# Patient Record
Sex: Female | Born: 1945
Health system: Southern US, Community
[De-identification: ages and names within clinical notes are randomized; demographics above are authoritative.]

## PROBLEM LIST (undated history)

## (undated) DIAGNOSIS — I10 Essential (primary) hypertension: Secondary | ICD-10-CM

## (undated) DIAGNOSIS — K589 Irritable bowel syndrome without diarrhea: Secondary | ICD-10-CM

## (undated) DIAGNOSIS — K219 Gastro-esophageal reflux disease without esophagitis: Secondary | ICD-10-CM

## (undated) DIAGNOSIS — F329 Major depressive disorder, single episode, unspecified: Secondary | ICD-10-CM

## (undated) DIAGNOSIS — C801 Malignant (primary) neoplasm, unspecified: Secondary | ICD-10-CM

## (undated) DIAGNOSIS — E559 Vitamin D deficiency, unspecified: Secondary | ICD-10-CM

## (undated) DIAGNOSIS — F32A Depression, unspecified: Secondary | ICD-10-CM

## (undated) DIAGNOSIS — R609 Edema, unspecified: Secondary | ICD-10-CM

## (undated) DIAGNOSIS — E119 Type 2 diabetes mellitus without complications: Secondary | ICD-10-CM

## (undated) DIAGNOSIS — R161 Splenomegaly, not elsewhere classified: Secondary | ICD-10-CM

## (undated) DIAGNOSIS — K7460 Unspecified cirrhosis of liver: Secondary | ICD-10-CM

## (undated) DIAGNOSIS — Z9289 Personal history of other medical treatment: Secondary | ICD-10-CM

## (undated) DIAGNOSIS — J449 Chronic obstructive pulmonary disease, unspecified: Secondary | ICD-10-CM

## (undated) DIAGNOSIS — E785 Hyperlipidemia, unspecified: Secondary | ICD-10-CM

## (undated) DIAGNOSIS — K7682 Hepatic encephalopathy: Secondary | ICD-10-CM

## (undated) DIAGNOSIS — M797 Fibromyalgia: Secondary | ICD-10-CM

## (undated) DIAGNOSIS — J189 Pneumonia, unspecified organism: Secondary | ICD-10-CM

## (undated) DIAGNOSIS — I809 Phlebitis and thrombophlebitis of unspecified site: Secondary | ICD-10-CM

## (undated) DIAGNOSIS — K7290 Hepatic failure, unspecified without coma: Secondary | ICD-10-CM

## (undated) DIAGNOSIS — K2970 Gastritis, unspecified, without bleeding: Secondary | ICD-10-CM

## (undated) DIAGNOSIS — J45909 Unspecified asthma, uncomplicated: Secondary | ICD-10-CM

## (undated) DIAGNOSIS — I8500 Esophageal varices without bleeding: Secondary | ICD-10-CM

## (undated) HISTORY — DX: Hepatic failure, unspecified without coma: K72.90

## (undated) HISTORY — DX: Hepatic encephalopathy: K76.82

## (undated) HISTORY — DX: Gastritis, unspecified, without bleeding: K29.70

## (undated) HISTORY — PX: NECK SURGERY: SHX720

## (undated) HISTORY — PX: CHOLECYSTECTOMY: SHX55

## (undated) HISTORY — DX: Vitamin D deficiency, unspecified: E55.9

## (undated) HISTORY — PX: CATARACT EXTRACTION, BILATERAL: SHX1313

## (undated) HISTORY — PX: HIP ARTHROPLASTY: SHX981

## (undated) HISTORY — PX: ABDOMINAL HYSTERECTOMY: SHX81

## (undated) HISTORY — PX: BACK SURGERY: SHX140

## (undated) HISTORY — DX: Esophageal varices without bleeding: I85.00

## (undated) HISTORY — DX: Splenomegaly, not elsewhere classified: R16.1

## (undated) HISTORY — DX: Unspecified asthma, uncomplicated: J45.909

## (undated) HISTORY — DX: Essential (primary) hypertension: I10

## (undated) HISTORY — DX: Irritable bowel syndrome, unspecified: K58.9

## (undated) HISTORY — PX: SHOULDER SURGERY: SHX246

## (undated) HISTORY — DX: Hyperlipidemia, unspecified: E78.5

## (undated) HISTORY — DX: Gastro-esophageal reflux disease without esophagitis: K21.9

## (undated) HISTORY — DX: Fibromyalgia: M79.7

## (undated) HISTORY — DX: Type 2 diabetes mellitus without complications: E11.9

## (undated) HISTORY — DX: Chronic obstructive pulmonary disease, unspecified: J44.9

---

## 1997-11-23 ENCOUNTER — Ambulatory Visit (HOSPITAL_COMMUNITY): Admission: RE | Admit: 1997-11-23 | Discharge: 1997-11-23 | Payer: Self-pay | Admitting: *Deleted

## 1998-03-10 ENCOUNTER — Other Ambulatory Visit: Admission: RE | Admit: 1998-03-10 | Discharge: 1998-03-10 | Payer: Self-pay | Admitting: Gastroenterology

## 1998-10-19 ENCOUNTER — Other Ambulatory Visit: Admission: RE | Admit: 1998-10-19 | Discharge: 1998-10-19 | Payer: Self-pay | Admitting: *Deleted

## 1998-10-26 ENCOUNTER — Ambulatory Visit (HOSPITAL_COMMUNITY): Admission: RE | Admit: 1998-10-26 | Discharge: 1998-10-26 | Payer: Self-pay | Admitting: *Deleted

## 1998-10-28 ENCOUNTER — Ambulatory Visit (HOSPITAL_COMMUNITY): Admission: RE | Admit: 1998-10-28 | Discharge: 1998-10-28 | Payer: Self-pay | Admitting: *Deleted

## 1998-12-07 ENCOUNTER — Inpatient Hospital Stay (HOSPITAL_COMMUNITY): Admission: RE | Admit: 1998-12-07 | Discharge: 1998-12-09 | Payer: Self-pay | Admitting: Orthopedic Surgery

## 1999-04-20 ENCOUNTER — Ambulatory Visit (HOSPITAL_COMMUNITY): Admission: RE | Admit: 1999-04-20 | Discharge: 1999-04-20 | Payer: Self-pay | Admitting: *Deleted

## 2000-01-31 ENCOUNTER — Other Ambulatory Visit: Admission: RE | Admit: 2000-01-31 | Discharge: 2000-01-31 | Payer: Self-pay | Admitting: *Deleted

## 2000-02-26 ENCOUNTER — Ambulatory Visit (HOSPITAL_COMMUNITY): Admission: RE | Admit: 2000-02-26 | Discharge: 2000-02-26 | Payer: Self-pay | Admitting: *Deleted

## 2000-05-09 ENCOUNTER — Emergency Department (HOSPITAL_COMMUNITY): Admission: EM | Admit: 2000-05-09 | Discharge: 2000-05-10 | Payer: Self-pay | Admitting: Emergency Medicine

## 2000-09-21 ENCOUNTER — Ambulatory Visit (HOSPITAL_COMMUNITY): Admission: RE | Admit: 2000-09-21 | Discharge: 2000-09-21 | Payer: Self-pay | Admitting: Internal Medicine

## 2000-09-21 ENCOUNTER — Encounter: Payer: Self-pay | Admitting: Internal Medicine

## 2000-09-30 ENCOUNTER — Encounter: Payer: Self-pay | Admitting: Neurosurgery

## 2000-10-01 ENCOUNTER — Encounter: Payer: Self-pay | Admitting: Neurosurgery

## 2000-10-01 ENCOUNTER — Observation Stay (HOSPITAL_COMMUNITY): Admission: RE | Admit: 2000-10-01 | Discharge: 2000-10-02 | Payer: Self-pay | Admitting: Neurosurgery

## 2000-11-21 ENCOUNTER — Encounter: Payer: Self-pay | Admitting: Neurosurgery

## 2000-11-21 ENCOUNTER — Encounter: Admission: RE | Admit: 2000-11-21 | Discharge: 2000-11-21 | Payer: Self-pay | Admitting: Neurosurgery

## 2001-01-15 ENCOUNTER — Encounter: Payer: Self-pay | Admitting: Internal Medicine

## 2001-01-15 ENCOUNTER — Ambulatory Visit (HOSPITAL_COMMUNITY): Admission: RE | Admit: 2001-01-15 | Discharge: 2001-01-15 | Payer: Self-pay | Admitting: Internal Medicine

## 2001-01-21 ENCOUNTER — Encounter: Payer: Self-pay | Admitting: Internal Medicine

## 2001-01-21 ENCOUNTER — Ambulatory Visit (HOSPITAL_COMMUNITY): Admission: RE | Admit: 2001-01-21 | Discharge: 2001-01-21 | Payer: Self-pay | Admitting: Internal Medicine

## 2001-02-19 ENCOUNTER — Encounter: Payer: Self-pay | Admitting: Internal Medicine

## 2001-02-19 ENCOUNTER — Ambulatory Visit (HOSPITAL_COMMUNITY): Admission: RE | Admit: 2001-02-19 | Discharge: 2001-02-19 | Payer: Self-pay | Admitting: Internal Medicine

## 2001-04-09 ENCOUNTER — Ambulatory Visit (HOSPITAL_COMMUNITY): Admission: AD | Admit: 2001-04-09 | Discharge: 2001-04-09 | Payer: Self-pay | Admitting: Cardiology

## 2001-06-19 ENCOUNTER — Ambulatory Visit (HOSPITAL_COMMUNITY): Admission: RE | Admit: 2001-06-19 | Discharge: 2001-06-19 | Payer: Self-pay | Admitting: Internal Medicine

## 2001-06-19 ENCOUNTER — Encounter: Payer: Self-pay | Admitting: Internal Medicine

## 2001-08-21 ENCOUNTER — Encounter: Admission: RE | Admit: 2001-08-21 | Discharge: 2001-08-21 | Payer: Self-pay | Admitting: Urology

## 2001-08-21 ENCOUNTER — Encounter: Payer: Self-pay | Admitting: Urology

## 2001-08-27 ENCOUNTER — Encounter: Payer: Self-pay | Admitting: Urology

## 2001-08-27 ENCOUNTER — Encounter: Admission: RE | Admit: 2001-08-27 | Discharge: 2001-08-27 | Payer: Self-pay | Admitting: Urology

## 2001-08-28 ENCOUNTER — Encounter: Payer: Self-pay | Admitting: Internal Medicine

## 2001-08-28 ENCOUNTER — Ambulatory Visit (HOSPITAL_COMMUNITY): Admission: RE | Admit: 2001-08-28 | Discharge: 2001-08-28 | Payer: Self-pay | Admitting: Internal Medicine

## 2001-09-03 ENCOUNTER — Encounter: Payer: Self-pay | Admitting: Urology

## 2001-09-03 ENCOUNTER — Ambulatory Visit (HOSPITAL_COMMUNITY): Admission: RE | Admit: 2001-09-03 | Discharge: 2001-09-03 | Payer: Self-pay | Admitting: Urology

## 2002-03-18 ENCOUNTER — Encounter: Admission: RE | Admit: 2002-03-18 | Discharge: 2002-03-18 | Payer: Self-pay | Admitting: Urology

## 2002-03-18 ENCOUNTER — Encounter: Payer: Self-pay | Admitting: Urology

## 2002-04-01 ENCOUNTER — Encounter: Payer: Self-pay | Admitting: Gastroenterology

## 2002-04-08 ENCOUNTER — Encounter: Payer: Self-pay | Admitting: Urology

## 2002-04-14 ENCOUNTER — Ambulatory Visit (HOSPITAL_COMMUNITY): Admission: RE | Admit: 2002-04-14 | Discharge: 2002-04-14 | Payer: Self-pay | Admitting: Urology

## 2002-04-14 ENCOUNTER — Encounter: Payer: Self-pay | Admitting: Urology

## 2002-05-12 ENCOUNTER — Ambulatory Visit (HOSPITAL_COMMUNITY): Admission: RE | Admit: 2002-05-12 | Discharge: 2002-05-12 | Payer: Self-pay | Admitting: Internal Medicine

## 2002-05-12 ENCOUNTER — Encounter: Payer: Self-pay | Admitting: Internal Medicine

## 2002-05-13 ENCOUNTER — Encounter: Payer: Self-pay | Admitting: Internal Medicine

## 2002-05-13 ENCOUNTER — Encounter: Admission: RE | Admit: 2002-05-13 | Discharge: 2002-05-13 | Payer: Self-pay | Admitting: Internal Medicine

## 2002-05-26 ENCOUNTER — Ambulatory Visit (HOSPITAL_COMMUNITY): Admission: RE | Admit: 2002-05-26 | Discharge: 2002-05-26 | Payer: Self-pay | Admitting: Urology

## 2002-06-18 ENCOUNTER — Encounter: Payer: Self-pay | Admitting: Urology

## 2002-06-18 ENCOUNTER — Encounter: Admission: RE | Admit: 2002-06-18 | Discharge: 2002-06-18 | Payer: Self-pay | Admitting: Urology

## 2002-08-13 ENCOUNTER — Encounter: Payer: Self-pay | Admitting: Urology

## 2002-08-13 ENCOUNTER — Ambulatory Visit (HOSPITAL_COMMUNITY): Admission: RE | Admit: 2002-08-13 | Discharge: 2002-08-13 | Payer: Self-pay | Admitting: Urology

## 2002-09-09 ENCOUNTER — Ambulatory Visit: Admission: RE | Admit: 2002-09-09 | Discharge: 2002-09-09 | Payer: Self-pay | Admitting: Internal Medicine

## 2002-10-26 ENCOUNTER — Encounter: Payer: Self-pay | Admitting: Internal Medicine

## 2002-10-26 ENCOUNTER — Ambulatory Visit (HOSPITAL_COMMUNITY): Admission: RE | Admit: 2002-10-26 | Discharge: 2002-10-26 | Payer: Self-pay | Admitting: Internal Medicine

## 2003-02-11 ENCOUNTER — Emergency Department (HOSPITAL_COMMUNITY): Admission: EM | Admit: 2003-02-11 | Discharge: 2003-02-11 | Payer: Self-pay | Admitting: Emergency Medicine

## 2003-03-14 ENCOUNTER — Encounter: Payer: Self-pay | Admitting: Neurosurgery

## 2003-03-14 ENCOUNTER — Ambulatory Visit (HOSPITAL_COMMUNITY): Admission: RE | Admit: 2003-03-14 | Discharge: 2003-03-14 | Payer: Self-pay | Admitting: Neurosurgery

## 2003-04-28 ENCOUNTER — Encounter: Payer: Self-pay | Admitting: Orthopedic Surgery

## 2003-05-03 ENCOUNTER — Inpatient Hospital Stay (HOSPITAL_COMMUNITY): Admission: RE | Admit: 2003-05-03 | Discharge: 2003-05-07 | Payer: Self-pay | Admitting: Orthopedic Surgery

## 2003-05-03 ENCOUNTER — Encounter: Payer: Self-pay | Admitting: Orthopedic Surgery

## 2003-06-09 ENCOUNTER — Encounter: Admission: RE | Admit: 2003-06-09 | Discharge: 2003-06-09 | Payer: Self-pay | Admitting: Neurosurgery

## 2003-06-24 ENCOUNTER — Encounter: Admission: RE | Admit: 2003-06-24 | Discharge: 2003-06-24 | Payer: Self-pay | Admitting: Neurosurgery

## 2003-07-20 ENCOUNTER — Encounter: Admission: RE | Admit: 2003-07-20 | Discharge: 2003-07-20 | Payer: Self-pay | Admitting: Neurosurgery

## 2003-08-17 ENCOUNTER — Ambulatory Visit (HOSPITAL_COMMUNITY): Admission: RE | Admit: 2003-08-17 | Discharge: 2003-08-17 | Payer: Self-pay | Admitting: Internal Medicine

## 2003-09-12 ENCOUNTER — Emergency Department (HOSPITAL_COMMUNITY): Admission: EM | Admit: 2003-09-12 | Discharge: 2003-09-12 | Payer: Self-pay | Admitting: Emergency Medicine

## 2003-09-28 ENCOUNTER — Ambulatory Visit (HOSPITAL_COMMUNITY): Admission: RE | Admit: 2003-09-28 | Discharge: 2003-09-28 | Payer: Self-pay | Admitting: Internal Medicine

## 2004-02-28 ENCOUNTER — Ambulatory Visit (HOSPITAL_COMMUNITY): Admission: RE | Admit: 2004-02-28 | Discharge: 2004-02-28 | Payer: Self-pay | Admitting: Internal Medicine

## 2004-03-22 ENCOUNTER — Inpatient Hospital Stay (HOSPITAL_COMMUNITY): Admission: RE | Admit: 2004-03-22 | Discharge: 2004-03-28 | Payer: Self-pay | Admitting: Neurosurgery

## 2004-04-15 ENCOUNTER — Emergency Department (HOSPITAL_COMMUNITY): Admission: EM | Admit: 2004-04-15 | Discharge: 2004-04-15 | Payer: Self-pay | Admitting: Emergency Medicine

## 2004-08-31 ENCOUNTER — Ambulatory Visit (HOSPITAL_COMMUNITY): Admission: RE | Admit: 2004-08-31 | Discharge: 2004-08-31 | Payer: Self-pay | Admitting: Internal Medicine

## 2005-03-06 ENCOUNTER — Ambulatory Visit (HOSPITAL_COMMUNITY): Admission: RE | Admit: 2005-03-06 | Discharge: 2005-03-06 | Payer: Self-pay | Admitting: Internal Medicine

## 2005-07-24 ENCOUNTER — Inpatient Hospital Stay (HOSPITAL_COMMUNITY): Admission: EM | Admit: 2005-07-24 | Discharge: 2005-07-26 | Payer: Self-pay | Admitting: Emergency Medicine

## 2006-03-07 ENCOUNTER — Ambulatory Visit (HOSPITAL_COMMUNITY): Admission: RE | Admit: 2006-03-07 | Discharge: 2006-03-07 | Payer: Self-pay | Admitting: Internal Medicine

## 2006-03-28 ENCOUNTER — Ambulatory Visit: Payer: Self-pay | Admitting: Gastroenterology

## 2006-04-01 ENCOUNTER — Ambulatory Visit: Payer: Self-pay | Admitting: Gastroenterology

## 2006-04-09 ENCOUNTER — Ambulatory Visit: Payer: Self-pay | Admitting: Cardiovascular Disease

## 2006-04-22 ENCOUNTER — Ambulatory Visit: Payer: Self-pay | Admitting: Gastroenterology

## 2006-04-26 ENCOUNTER — Ambulatory Visit (HOSPITAL_COMMUNITY): Admission: RE | Admit: 2006-04-26 | Discharge: 2006-04-27 | Payer: Self-pay | Admitting: Neurosurgery

## 2006-05-27 ENCOUNTER — Ambulatory Visit: Payer: Self-pay | Admitting: Gastroenterology

## 2006-05-28 ENCOUNTER — Encounter: Payer: Self-pay | Admitting: Gastroenterology

## 2006-05-28 ENCOUNTER — Ambulatory Visit (HOSPITAL_COMMUNITY): Admission: RE | Admit: 2006-05-28 | Discharge: 2006-05-28 | Payer: Self-pay | Admitting: Gastroenterology

## 2006-05-28 ENCOUNTER — Encounter (INDEPENDENT_AMBULATORY_CARE_PROVIDER_SITE_OTHER): Payer: Self-pay | Admitting: *Deleted

## 2006-06-03 ENCOUNTER — Ambulatory Visit: Payer: Self-pay | Admitting: Gastroenterology

## 2006-06-05 ENCOUNTER — Encounter: Admission: RE | Admit: 2006-06-05 | Discharge: 2006-06-05 | Payer: Self-pay | Admitting: Neurosurgery

## 2006-06-07 ENCOUNTER — Ambulatory Visit: Payer: Self-pay | Admitting: Gastroenterology

## 2006-10-14 ENCOUNTER — Encounter: Admission: RE | Admit: 2006-10-14 | Discharge: 2006-10-14 | Payer: Self-pay | Admitting: Internal Medicine

## 2006-10-17 ENCOUNTER — Ambulatory Visit (HOSPITAL_COMMUNITY): Admission: RE | Admit: 2006-10-17 | Discharge: 2006-10-17 | Payer: Self-pay | Admitting: Anesthesiology

## 2006-10-29 ENCOUNTER — Encounter: Admission: RE | Admit: 2006-10-29 | Discharge: 2006-10-29 | Payer: Self-pay | Admitting: Internal Medicine

## 2007-03-17 ENCOUNTER — Emergency Department (HOSPITAL_COMMUNITY): Admission: EM | Admit: 2007-03-17 | Discharge: 2007-03-17 | Payer: Self-pay | Admitting: Emergency Medicine

## 2007-03-25 ENCOUNTER — Ambulatory Visit (HOSPITAL_COMMUNITY): Admission: RE | Admit: 2007-03-25 | Discharge: 2007-03-25 | Payer: Self-pay | Admitting: Internal Medicine

## 2007-04-03 ENCOUNTER — Ambulatory Visit (HOSPITAL_COMMUNITY): Admission: RE | Admit: 2007-04-03 | Discharge: 2007-04-03 | Payer: Self-pay | Admitting: Internal Medicine

## 2007-04-08 ENCOUNTER — Ambulatory Visit (HOSPITAL_COMMUNITY): Admission: RE | Admit: 2007-04-08 | Discharge: 2007-04-08 | Payer: Self-pay | Admitting: Internal Medicine

## 2007-05-23 ENCOUNTER — Ambulatory Visit: Payer: Self-pay

## 2007-09-16 ENCOUNTER — Encounter: Admission: RE | Admit: 2007-09-16 | Discharge: 2007-09-16 | Payer: Self-pay | Admitting: Orthopedic Surgery

## 2007-09-19 ENCOUNTER — Encounter: Admission: RE | Admit: 2007-09-19 | Discharge: 2007-09-19 | Payer: Self-pay | Admitting: Internal Medicine

## 2008-01-27 ENCOUNTER — Encounter: Admission: RE | Admit: 2008-01-27 | Discharge: 2008-01-27 | Payer: Self-pay | Admitting: Orthopedic Surgery

## 2008-02-22 ENCOUNTER — Emergency Department (HOSPITAL_COMMUNITY): Admission: EM | Admit: 2008-02-22 | Discharge: 2008-02-22 | Payer: Self-pay | Admitting: Family Medicine

## 2008-03-25 ENCOUNTER — Ambulatory Visit (HOSPITAL_COMMUNITY): Admission: RE | Admit: 2008-03-25 | Discharge: 2008-03-25 | Payer: Self-pay | Admitting: Internal Medicine

## 2008-04-02 ENCOUNTER — Ambulatory Visit (HOSPITAL_COMMUNITY): Admission: RE | Admit: 2008-04-02 | Discharge: 2008-04-02 | Payer: Self-pay | Admitting: General Surgery

## 2008-04-13 ENCOUNTER — Inpatient Hospital Stay (HOSPITAL_COMMUNITY): Admission: EM | Admit: 2008-04-13 | Discharge: 2008-04-17 | Payer: Self-pay | Admitting: Emergency Medicine

## 2008-04-15 ENCOUNTER — Encounter (INDEPENDENT_AMBULATORY_CARE_PROVIDER_SITE_OTHER): Payer: Self-pay | Admitting: *Deleted

## 2008-04-15 ENCOUNTER — Encounter (INDEPENDENT_AMBULATORY_CARE_PROVIDER_SITE_OTHER): Payer: Self-pay | Admitting: General Surgery

## 2008-04-20 ENCOUNTER — Ambulatory Visit (HOSPITAL_COMMUNITY): Admission: RE | Admit: 2008-04-20 | Discharge: 2008-04-20 | Payer: Self-pay | Admitting: Internal Medicine

## 2008-05-08 ENCOUNTER — Emergency Department (HOSPITAL_COMMUNITY): Admission: EM | Admit: 2008-05-08 | Discharge: 2008-05-08 | Payer: Self-pay | Admitting: Emergency Medicine

## 2008-05-10 ENCOUNTER — Ambulatory Visit (HOSPITAL_COMMUNITY): Admission: RE | Admit: 2008-05-10 | Discharge: 2008-05-10 | Payer: Self-pay | Admitting: Orthopedic Surgery

## 2008-05-27 ENCOUNTER — Telehealth: Payer: Self-pay | Admitting: Gastroenterology

## 2008-05-27 DIAGNOSIS — K209 Esophagitis, unspecified: Secondary | ICD-10-CM

## 2008-05-27 DIAGNOSIS — K5730 Diverticulosis of large intestine without perforation or abscess without bleeding: Secondary | ICD-10-CM

## 2008-05-28 ENCOUNTER — Ambulatory Visit: Payer: Self-pay | Admitting: Gastroenterology

## 2008-05-28 DIAGNOSIS — K7460 Unspecified cirrhosis of liver: Secondary | ICD-10-CM

## 2008-05-28 DIAGNOSIS — D649 Anemia, unspecified: Secondary | ICD-10-CM | POA: Insufficient documentation

## 2008-06-01 ENCOUNTER — Encounter: Payer: Self-pay | Admitting: Gastroenterology

## 2008-06-01 ENCOUNTER — Ambulatory Visit: Payer: Self-pay | Admitting: Gastroenterology

## 2008-06-04 ENCOUNTER — Ambulatory Visit: Payer: Self-pay | Admitting: Gastroenterology

## 2008-06-04 LAB — CONVERTED CEMR LAB
OCCULT 1: NEGATIVE
OCCULT 2: NEGATIVE
OCCULT 3: NEGATIVE
OCCULT 4: NEGATIVE
OCCULT 5: NEGATIVE

## 2008-06-07 ENCOUNTER — Emergency Department (HOSPITAL_COMMUNITY): Admission: EM | Admit: 2008-06-07 | Discharge: 2008-06-08 | Payer: Self-pay | Admitting: Emergency Medicine

## 2008-06-08 ENCOUNTER — Telehealth: Payer: Self-pay | Admitting: Gastroenterology

## 2008-06-10 ENCOUNTER — Encounter: Admission: RE | Admit: 2008-06-10 | Discharge: 2008-06-10 | Payer: Self-pay | Admitting: Internal Medicine

## 2008-06-10 ENCOUNTER — Ambulatory Visit: Payer: Self-pay | Admitting: Gastroenterology

## 2008-06-15 ENCOUNTER — Encounter: Payer: Self-pay | Admitting: Gastroenterology

## 2008-06-17 ENCOUNTER — Encounter: Payer: Self-pay | Admitting: Gastroenterology

## 2008-06-17 ENCOUNTER — Ambulatory Visit: Payer: Self-pay | Admitting: Gastroenterology

## 2008-06-17 LAB — CONVERTED CEMR LAB
Basophils Absolute: 0 10*3/uL (ref 0.0–0.1)
Eosinophils Absolute: 0.1 10*3/uL (ref 0.0–0.7)
Lymphocytes Relative: 25.1 % (ref 12.0–46.0)
MCHC: 32.8 g/dL (ref 30.0–36.0)
MCV: 77.5 fL — ABNORMAL LOW (ref 78.0–100.0)
Neutrophils Relative %: 68.8 % (ref 43.0–77.0)
Platelets: 91 10*3/uL — ABNORMAL LOW (ref 150–400)
RDW: 19.1 % — ABNORMAL HIGH (ref 11.5–14.6)

## 2008-06-21 ENCOUNTER — Encounter: Payer: Self-pay | Admitting: Gastroenterology

## 2008-06-28 ENCOUNTER — Inpatient Hospital Stay (HOSPITAL_COMMUNITY): Admission: RE | Admit: 2008-06-28 | Discharge: 2008-07-01 | Payer: Self-pay | Admitting: Orthopedic Surgery

## 2008-10-21 ENCOUNTER — Encounter: Payer: Self-pay | Admitting: Gastroenterology

## 2008-10-21 ENCOUNTER — Ambulatory Visit (HOSPITAL_COMMUNITY): Admission: RE | Admit: 2008-10-21 | Discharge: 2008-10-21 | Payer: Self-pay | Admitting: Internal Medicine

## 2008-10-22 ENCOUNTER — Emergency Department (HOSPITAL_COMMUNITY): Admission: EM | Admit: 2008-10-22 | Discharge: 2008-10-22 | Payer: Self-pay | Admitting: Emergency Medicine

## 2008-10-23 ENCOUNTER — Observation Stay (HOSPITAL_COMMUNITY): Admission: EM | Admit: 2008-10-23 | Discharge: 2008-10-25 | Payer: Self-pay | Admitting: Emergency Medicine

## 2008-11-02 ENCOUNTER — Encounter: Payer: Self-pay | Admitting: Gastroenterology

## 2008-11-03 ENCOUNTER — Telehealth: Payer: Self-pay | Admitting: Gastroenterology

## 2008-11-11 ENCOUNTER — Ambulatory Visit: Payer: Self-pay | Admitting: Gastroenterology

## 2008-11-11 DIAGNOSIS — D126 Benign neoplasm of colon, unspecified: Secondary | ICD-10-CM

## 2008-11-12 ENCOUNTER — Telehealth: Payer: Self-pay | Admitting: Gastroenterology

## 2008-11-15 ENCOUNTER — Telehealth: Payer: Self-pay | Admitting: Gastroenterology

## 2008-11-23 ENCOUNTER — Ambulatory Visit: Payer: Self-pay | Admitting: Gastroenterology

## 2008-11-23 ENCOUNTER — Encounter: Payer: Self-pay | Admitting: Gastroenterology

## 2008-12-02 ENCOUNTER — Telehealth: Payer: Self-pay | Admitting: Gastroenterology

## 2008-12-02 ENCOUNTER — Encounter: Payer: Self-pay | Admitting: Gastroenterology

## 2008-12-09 ENCOUNTER — Ambulatory Visit (HOSPITAL_COMMUNITY): Admission: RE | Admit: 2008-12-09 | Discharge: 2008-12-09 | Payer: Self-pay | Admitting: Gastroenterology

## 2008-12-09 ENCOUNTER — Ambulatory Visit: Payer: Self-pay | Admitting: Gastroenterology

## 2008-12-29 ENCOUNTER — Encounter: Payer: Self-pay | Admitting: Gastroenterology

## 2009-01-04 ENCOUNTER — Telehealth: Payer: Self-pay | Admitting: Gastroenterology

## 2009-01-06 ENCOUNTER — Ambulatory Visit: Payer: Self-pay | Admitting: Gastroenterology

## 2009-01-11 ENCOUNTER — Emergency Department (HOSPITAL_COMMUNITY): Admission: EM | Admit: 2009-01-11 | Discharge: 2009-01-12 | Payer: Self-pay | Admitting: Emergency Medicine

## 2009-02-11 ENCOUNTER — Encounter: Admission: RE | Admit: 2009-02-11 | Discharge: 2009-02-11 | Payer: Self-pay | Admitting: Anesthesiology

## 2009-02-28 ENCOUNTER — Encounter: Payer: Self-pay | Admitting: Gastroenterology

## 2009-05-05 ENCOUNTER — Telehealth: Payer: Self-pay | Admitting: Gastroenterology

## 2009-05-10 ENCOUNTER — Ambulatory Visit: Payer: Self-pay | Admitting: Gastroenterology

## 2009-05-10 LAB — CONVERTED CEMR LAB
Anti Nuclear Antibody(ANA): NEGATIVE
Hepatitis B Surface Ag: NEGATIVE
Prothrombin Time: 11.1 s (ref 9.1–11.7)
aPTT: 31.5 s — ABNORMAL HIGH (ref 21.7–28.8)

## 2009-05-13 ENCOUNTER — Ambulatory Visit (HOSPITAL_COMMUNITY): Admission: RE | Admit: 2009-05-13 | Discharge: 2009-05-13 | Payer: Self-pay | Admitting: Gastroenterology

## 2009-05-26 ENCOUNTER — Telehealth: Payer: Self-pay | Admitting: Gastroenterology

## 2009-08-01 ENCOUNTER — Ambulatory Visit (HOSPITAL_COMMUNITY): Admission: RE | Admit: 2009-08-01 | Discharge: 2009-08-01 | Payer: Self-pay | Admitting: Internal Medicine

## 2009-08-04 ENCOUNTER — Ambulatory Visit (HOSPITAL_COMMUNITY): Admission: RE | Admit: 2009-08-04 | Discharge: 2009-08-04 | Payer: Self-pay | Admitting: Internal Medicine

## 2009-08-09 ENCOUNTER — Encounter: Admission: RE | Admit: 2009-08-09 | Discharge: 2009-08-09 | Payer: Self-pay | Admitting: Internal Medicine

## 2009-08-31 ENCOUNTER — Ambulatory Visit: Payer: Self-pay | Admitting: Hematology and Oncology

## 2009-09-01 ENCOUNTER — Encounter: Payer: Self-pay | Admitting: Gastroenterology

## 2009-09-01 LAB — CBC WITH DIFFERENTIAL/PLATELET
EOS%: 3.1 % (ref 0.0–7.0)
Eosinophils Absolute: 0.1 10*3/uL (ref 0.0–0.5)
LYMPH%: 33.5 % (ref 14.0–49.7)
MCH: 30.7 pg (ref 25.1–34.0)
MCV: 91 fL (ref 79.5–101.0)
MONO%: 6.3 % (ref 0.0–14.0)
NEUT#: 2 10*3/uL (ref 1.5–6.5)
Platelets: 90 10*3/uL — ABNORMAL LOW (ref 145–400)
RBC: 3.77 10*6/uL (ref 3.70–5.45)
RDW: 14.2 % (ref 11.2–14.5)

## 2009-09-01 LAB — CHCC SMEAR

## 2009-09-05 LAB — COMPREHENSIVE METABOLIC PANEL
AST: 27 U/L (ref 0–37)
BUN: 10 mg/dL (ref 6–23)
CO2: 31 mEq/L (ref 19–32)
Calcium: 9 mg/dL (ref 8.4–10.5)
Chloride: 104 mEq/L (ref 96–112)
Creatinine, Ser: 0.77 mg/dL (ref 0.40–1.20)
Total Bilirubin: 0.6 mg/dL (ref 0.3–1.2)

## 2009-09-05 LAB — PROTEIN ELECTROPHORESIS, SERUM, WITH REFLEX
Albumin ELP: 60 % (ref 55.8–66.1)
Alpha-1-Globulin: 6 % — ABNORMAL HIGH (ref 2.9–4.9)
Alpha-2-Globulin: 10.1 % (ref 7.1–11.8)

## 2009-09-05 LAB — LACTATE DEHYDROGENASE: LDH: 167 U/L (ref 94–250)

## 2009-09-06 ENCOUNTER — Ambulatory Visit (HOSPITAL_COMMUNITY): Admission: RE | Admit: 2009-09-06 | Discharge: 2009-09-06 | Payer: Self-pay | Admitting: Hematology and Oncology

## 2009-09-13 ENCOUNTER — Encounter: Payer: Self-pay | Admitting: Gastroenterology

## 2009-09-15 ENCOUNTER — Other Ambulatory Visit: Admission: RE | Admit: 2009-09-15 | Discharge: 2009-09-15 | Payer: Self-pay | Admitting: Hematology and Oncology

## 2009-09-15 ENCOUNTER — Emergency Department (HOSPITAL_COMMUNITY): Admission: EM | Admit: 2009-09-15 | Discharge: 2009-09-15 | Payer: Self-pay | Admitting: Emergency Medicine

## 2009-09-23 ENCOUNTER — Encounter: Payer: Self-pay | Admitting: Gastroenterology

## 2010-01-18 ENCOUNTER — Encounter: Payer: Self-pay | Admitting: Gastroenterology

## 2010-05-15 ENCOUNTER — Encounter: Payer: Self-pay | Admitting: Gastroenterology

## 2010-05-30 ENCOUNTER — Encounter: Payer: Self-pay | Admitting: Gastroenterology

## 2010-07-04 ENCOUNTER — Encounter: Payer: Self-pay | Admitting: Gastroenterology

## 2010-07-04 ENCOUNTER — Ambulatory Visit (HOSPITAL_COMMUNITY): Admission: RE | Admit: 2010-07-04 | Discharge: 2010-07-04 | Payer: Self-pay | Admitting: Internal Medicine

## 2010-07-06 ENCOUNTER — Encounter: Payer: Self-pay | Admitting: Gastroenterology

## 2010-08-01 ENCOUNTER — Encounter: Payer: Self-pay | Admitting: Gastroenterology

## 2010-08-01 ENCOUNTER — Ambulatory Visit (HOSPITAL_COMMUNITY)
Admission: RE | Admit: 2010-08-01 | Discharge: 2010-08-01 | Payer: Self-pay | Source: Home / Self Care | Attending: Internal Medicine | Admitting: Internal Medicine

## 2010-08-02 ENCOUNTER — Telehealth: Payer: Self-pay | Admitting: Gastroenterology

## 2010-08-04 ENCOUNTER — Encounter: Payer: Self-pay | Admitting: Gastroenterology

## 2010-08-10 ENCOUNTER — Encounter: Payer: Self-pay | Admitting: Gastroenterology

## 2010-08-27 ENCOUNTER — Encounter: Payer: Self-pay | Admitting: Internal Medicine

## 2010-08-29 ENCOUNTER — Telehealth: Payer: Self-pay | Admitting: Gastroenterology

## 2010-09-01 ENCOUNTER — Ambulatory Visit (HOSPITAL_COMMUNITY)
Admission: RE | Admit: 2010-09-01 | Discharge: 2010-09-01 | Payer: Self-pay | Source: Home / Self Care | Attending: Anesthesiology | Admitting: Anesthesiology

## 2010-09-05 NOTE — Letter (Signed)
Summary: [facility]   Imported By: Phillis Knack 10/07/2009 11:41:41  _____________________________________________________________________  External Attachment:    Type:   Image     Comment:   External Document

## 2010-09-05 NOTE — Letter (Signed)
Summary: Patient Notice-Endo Biopsy Results  [facility]  [place], [place] [address]   Phone: [phone]  Fax: [phone]        June 04, 2008 MRN: [id number]    [name] [place], [place]  [address]    Dear [name],  I am pleased to inform you that the biopsies taken during your recent endoscopic examination did not show any evidence of cancer upon pathologic examination.  Additional information/recommendations:  __No further action is needed at this time.  Please follow-up with      your primary care physician for your other healthcare needs.  __ Please call [phone] to schedule a return visit to review      your condition.  _x_ Continue with the treatment plan as outlined on the day of your      exam.  __ You should have a repeat endoscopic examination for thi_s problem              in _ months/years.   Please call us if you are having persistent problems or have questions about your condition that have not been fully answered at this time.  Sincerely,  Inda Castle MD  This letter has been electronically signed by your physician.

## 2010-09-05 NOTE — Letter (Signed)
Summary: [facility]   Imported By: Phillis Knack 09/15/2009 12:24:17  _____________________________________________________________________  External Attachment:    Type:   Image     Comment:   External Document

## 2010-09-07 NOTE — Progress Notes (Signed)
Summary: Triage  Phone Note Other Incoming   Caller: Trip @ Dr. Johnnye Sima [phone] Summary of Call: abd pain and followup of cirrhosis...requesting pt. be seen in the next 2-3 weeks Initial call taken by: Webb Laws,  August 29, 2010 8:54 AM  Follow-up for Phone Call        Appointment given for 09/25/10 @ 3:15pm. Trip will notify the patient. Instructed him to tell patient if she needs to be seen sooner to call us back and we could get her in to see one of the midlevel providers. Follow-up by: Rosanne Sack RN,  August 29, 2010 10:30 AM

## 2010-09-07 NOTE — Progress Notes (Signed)
Summary: Discuss dx with nurse  Phone Note From Other Clinic   Caller: Fredderick Phenix [phone]  Call For: Dr Deatra Ina Summary of Call: Wants to discuss with nurse patients diagnosis to determine if his appt on 08-23-10 should be sooner. Initial call taken by: Irwin Brakeman [facility],  August 02, 2010 1:42 PM  Follow-up for Phone Call        pt. was seen in their office yesterday and blood  work done for cirrhosis and they adjusted her diuretics.They have her coming in Friday and they will fax results to Vaughan Basta to see if she needs worked in sooner than 08/23/10 Follow-up by: Abel Presto RN,  August 02, 2010 3:04 PM  Additional Follow-up for Phone Call Additional follow up Details #1::        Followed up with Tripp because we have not seen any labs on this patient. Per Tripp the patient is being seen in their office today and they will send records after this visit. Additional Follow-up by: Rosanne Sack RN,  August 10, 2010 2:20 PM    Additional Follow-up for Phone Call Additional follow up Details #2::    Patients husband called and cancelled appointment on 08/11/10, states that her primary physician has "gotten her under control." Follow-up by: Rosanne Sack RN,  August 14, 2010 2:26 PM

## 2010-09-08 NOTE — Letter (Signed)
Summary: [facility]   Imported By: Phillis Knack 10/07/2009 11:39:52  _____________________________________________________________________  External Attachment:    Type:   Image     Comment:   External Document

## 2010-09-13 NOTE — Letter (Signed)
Summary: [facility]  [facility]   Imported By: Phillis Knack 09/08/2010 12:36:30  _____________________________________________________________________  External Attachment:    Type:   Image     Comment:   External Document

## 2010-09-13 NOTE — Letter (Signed)
Summary: [facility]  [facility]   Imported By: Phillis Knack 09/08/2010 12:37:19  _____________________________________________________________________  External Attachment:    Type:   Image     Comment:   External Document

## 2010-09-25 ENCOUNTER — Ambulatory Visit (INDEPENDENT_AMBULATORY_CARE_PROVIDER_SITE_OTHER): Payer: 59 | Admitting: Gastroenterology

## 2010-09-25 ENCOUNTER — Encounter (INDEPENDENT_AMBULATORY_CARE_PROVIDER_SITE_OTHER): Payer: Self-pay | Admitting: *Deleted

## 2010-09-25 ENCOUNTER — Encounter: Payer: Self-pay | Admitting: Gastroenterology

## 2010-09-25 DIAGNOSIS — K7460 Unspecified cirrhosis of liver: Secondary | ICD-10-CM

## 2010-10-03 NOTE — Assessment & Plan Note (Signed)
Summary: F/U Cirrhosis   History of Present Illness Visit Type: Follow-up Visit Primary GI MD: Erskine Emery MD [facility] Primary Provider: Rachel Moulds, DO Requesting Provider: Rachel Moulds, DO Chief Complaint: cirrhosis History of Present Illness:   Mrs. Furlough  is a 65 year old white female with [name] and cirrhosis, here for yearly followup. She has no new GI complaints. She continues to have right upper quadrant discomfort. She reports mild constipation. She has portal hypertension with splenomegaly and thrombocytopenia. Last endoscopy in May, 2010 , was negative for varices.   GI Review of Systems    Reports abdominal pain and  bloating.      Denies acid reflux, belching, chest pain, dysphagia with liquids, dysphagia with solids, heartburn, loss of appetite, nausea, vomiting, vomiting blood, weight loss, and  weight gain.      Reports constipation and  liver problems.     Denies anal fissure, black tarry stools, change in bowel habit, diarrhea, diverticulosis, fecal incontinence, heme positive stool, hemorrhoids, irritable bowel syndrome, jaundice, light color stool, rectal bleeding, and  rectal pain.    Current Medications (verified): 1)  Omeprazole 20 Mg  Cpdr (Omeprazole) .Marland Kitchen.. 1 Each Day 30 Minutes Before Meal 2)  Lyrica 100 Mg Caps (Pregabalin) .... One Tablet By Mouth Three Times A Day 3)  Cymbalta 60 Mg Cpep (Duloxetine Hcl) .Marland Kitchen.. 1 Tablet By Mouth Once Daily 4)  Amitriptyline Hcl 10 Mg Tabs (Amitriptyline Hcl) .Marland Kitchen.. 1 Tablet At Bedtime 5)  Evista 60 Mg Tabs (Raloxifene Hcl) .Marland Kitchen.. 1 Tablet By Mouth Once Daily 6)  Vitamin D 1000 Unit Caps (Cholecalciferol) .... Take 2 Caps Daily 7)  Fish Oil 1200 Mg Caps (Omega-3 Fatty Acids) .... Take 1 Tablet By Mouth Once A Day 8)  Aleve 220 Mg Caps (Naproxen Sodium) .... Take 2 Caps Daily 9)  Adult Aspirin Ec Low Strength 81 Mg Tbec (Aspirin) .... Take 1 Tablet By Mouth Once A Day 10)  Natural Vegetable Laxative 65-325 Mg Tabs (Senna) .... As  Needed 11)  Cvs Iron 325 (65 Fe) Mg Tabs (Ferrous Sulfate) .... One Tablet By Mouth Two Times A Day 12)  Methadone Hcl 5 Mg Tabs (Methadone Hcl) .... 1/2 To 1 Tablet By Mouth Once Daily 13)  Magnesium 250 Mg Tabs (Magnesium) .... One Tablet By Mouth Once Daily 14)  Vitamin E 400 Unit Caps (Vitamin E) .... Once Daily 15)  Integra Plus  Caps (Fefum-Fepoly-Fa-B Cmp-C-Biot) .... Once Daily  Allergies (verified): 1)  ! Benadryl 2)  ! Vicodin 3)  ! Zocor 4)  ! Sulfa 5)  ! Lipitor 6)  ! * Emycin 7)  ! Ativan 8)  ! * Claritin D 9)  ! Bebe Shaggy  Past History:  Past Medical History: Reviewed history from 11/11/2008 and no changes required. 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 L Hip replacement  2002 Back surgery x2 Cholecystectomy 9/09 R Hip Replacement 11/09 Shoulder Surgery  Family History: Reviewed history from 05/28/2008 and no changes required. Family History of Ovarian Cancer:maternal grandmother No FH of Colon Cancer: Family History of Diabetes: brother Family History of Heart Disease: mother  Social History: Reviewed history from 11/11/2008 and no changes required. Patient currently smokes.  Alcohol Use - yes Daily Caffeine Use coffee, coke, tea Illicit Drug Use - no  Review of Systems  The patient complains of arthritis/joint pain and back pain.  The patient denies allergy/sinus, anemia, anxiety-new, blood in urine, breast changes/lumps, change in vision, confusion, cough, coughing up blood, depression-new, fainting, fatigue, fever, headaches-new, hearing problems, heart murmur, heart rhythm changes, itching, menstrual pain, muscle pains/cramps, night sweats, nosebleeds, pregnancy symptoms, shortness  of breath, skin rash, sleeping problems, sore throat, swelling of feet/legs, swollen lymph glands, thirst - excessive , urination - excessive , urination changes/pain, urine leakage, vision changes, and voice change.         All other systems were reviewed and were negative   Vital Signs:  Patient profile:   65 year old female Height:      62 inches Weight:      160.38 pounds BMI:     29.44 Pulse rate:   68 / minute Pulse rhythm:   regular BP sitting:   146 / 72  (left arm) Cuff size:   regular  Vitals Entered By: June McMurray CMA Deborra Medina) (September 25, 2010 3:17 PM)  Physical Exam  Additional Exam:   On physical exam, she is a well-developed, well-nourished, female.  Physical Exam: General:   WDWN HEENT:   anicteric.  No pharyngeal abnormalities Neck:   No masses, thyroidmegaly Nodes:   No cervical, axillary, inguinal adenopathy Chest:    Clear to auscultation Cardiac:   No murmurs, gallops, rubs Abdomen:   BS active.  No abd masses;  liver is palpable and tender in the right upper quadrant at the right costal margin. Rectal:   Deferred Extremities:   No cyanosis, clubbing;  there is trace pedal Skeletal:   No deformities Neuro:   Alert, oriented x3.  No focal abnormalities     Impression & Recommendations:  Problem # 1:  CIRRHOSIS OF LIVER WITHOUT MENTION OF ALCOHOL (ICD-571.5)   She appears to have stable cirrhosis.  With history of portal hypertension and splenomegaly she should undergo surveillance endoscopy  for varices.  Recommendations #1 upper endoscopy. This will be done with  propofol sedation since she is taking methadone for chronic back pain  Orders: EGD (EGD)  Problem # 2:  ABDOMINAL PAIN RIGHT UPPER QUADRANT (ICD-789.01)  This is most likely due to capsular distention of her liver.  Problem # 3:  COLONIC POLYPS (ICD-211.3)  Plan followup colonoscopy in 2015  Patient Instructions: 1)  Copy sent to : Rachel Moulds, DO 2)  Your  procedure has been scheduled for 10/10/2010, please follow the seperate instructions.  3)  The medication list was reviewed and reconciled.  All changed / newly prescribed medications were explained.  A complete medication list was provided to the patient / caregiver.

## 2010-10-03 NOTE — Procedures (Signed)
Summary: Instructions for procedure/[place]  Instructions for procedure/[facility]   Imported By: Phillis Knack 09/27/2010 14:07:19  _____________________________________________________________________  External Attachment:    Type:   Image     Comment:   External Document

## 2010-10-03 NOTE — Letter (Signed)
Summary: EGD Instructions  [facility]  [place],  [address]   Phone: [phone]  Fax: [phone]       [name]    [birth year]    MRN: [id number]       Procedure Day /Date: Tuesday 10/10/2010     Arrival Time: 10:45am     Procedure Time: 11:45am     Location of Procedure:                    [facility] ( Outpatient Registration)  PREPARATION FOR ENDOSCOPY   On 10/10/2010 THE DAY OF THE PROCEDURE:  1.   No solid foods, milk or milk products are allowed after midnight the night before your procedure.  2.   Do not drink anything colored red or purple.  Avoid juices with pulp.  No orange juice.  3.  You may drink clear liquids until 7:45am, which is 4 hours before your procedure.                                                                                                CLEAR LIQUIDS INCLUDE: Water Jello Ice Popsicles Tea (sugar ok, no milk/cream) Powdered fruit flavored drinks Coffee (sugar ok, no milk/cream) Gatorade Juice: apple, white grape, white cranberry  Lemonade Clear bullion, consomm, broth Carbonated beverages (any kind) Strained chicken noodle soup Hard Candy   MEDICATION INSTRUCTIONS  Unless otherwise instructed, you should take regular prescription medications with a small sip of water as early as possible the morning of your procedure.                 OTHER INSTRUCTIONS  You will need a responsible adult at least 64 years of age to accompany you and drive you home.   This person must remain in the waiting room during your procedure.  Wear loose fitting clothing that is easily removed.  Leave jewelry and other valuables at home.  However, you may wish to bring a book to read or an iPod/MP3 player to listen to music as you wait for your procedure to start.  Remove all body piercing jewelry and leave at home.  Total time from sign-in until discharge is approximately 2-3 hours.  You  should go home directly after your procedure and rest.  You can resume normal activities the day after your procedure.  The day of your procedure you should not:   Drive   Make legal decisions   Operate machinery   Drink alcohol   Return to work  You will receive specific instructions about eating, activities and medications before you leave.    The above instructions have been reviewed and explained to me by   _______________________    I fully understand and can verbalize these instructions _____________________________ Date _________

## 2010-10-10 ENCOUNTER — Encounter: Payer: Self-pay | Admitting: Gastroenterology

## 2010-10-10 ENCOUNTER — Ambulatory Visit (HOSPITAL_COMMUNITY)
Admission: RE | Admit: 2010-10-10 | Discharge: 2010-10-10 | Disposition: A | Discharge status: Home / Self Care | Payer: 59 | Source: Ambulatory Visit | Attending: Gastroenterology | Admitting: Gastroenterology

## 2010-10-10 ENCOUNTER — Encounter: Payer: 59 | Admitting: Gastroenterology

## 2010-10-10 DIAGNOSIS — J45909 Unspecified asthma, uncomplicated: Secondary | ICD-10-CM | POA: Insufficient documentation

## 2010-10-10 DIAGNOSIS — Z96649 Presence of unspecified artificial hip joint: Secondary | ICD-10-CM | POA: Insufficient documentation

## 2010-10-10 DIAGNOSIS — I10 Essential (primary) hypertension: Secondary | ICD-10-CM | POA: Insufficient documentation

## 2010-10-10 DIAGNOSIS — K7460 Unspecified cirrhosis of liver: Secondary | ICD-10-CM | POA: Insufficient documentation

## 2010-10-10 DIAGNOSIS — K219 Gastro-esophageal reflux disease without esophagitis: Secondary | ICD-10-CM | POA: Insufficient documentation

## 2010-10-10 DIAGNOSIS — I8500 Esophageal varices without bleeding: Secondary | ICD-10-CM

## 2010-10-13 DIAGNOSIS — I8500 Esophageal varices without bleeding: Secondary | ICD-10-CM

## 2010-10-17 NOTE — Procedures (Signed)
Summary: Upper Endoscopy  Patient: [name] Note: All result statuses are Final unless otherwise noted.  Tests: (1) Upper Endoscopy (EGD)   EGD Upper Endoscopy       DONE     [facility]     [place], [place]  [address]          ENDOSCOPY PROCEDURE REPORT          PATIENT:  [name], [name]  MR#:  [id number]     BIRTHDATE:  1946/01/15, 64 yrs. old  GENDER:  female          ENDOSCOPIST:  [name]. Deatra Ina, MD     Referred by:  Rachel Moulds, D.O.          PROCEDURE DATE:  10/10/2010     PROCEDURE:  EGD, diagnostic 15726     ASA CLASS:  Class II     INDICATIONS:  Known esophageal varices, for surveillance exam.          MEDICATIONS:   MAC sedation, administered by CRNA     TOPICAL ANESTHETIC:  Cetacaine Spray          DESCRIPTION OF PROCEDURE:   After the risks benefits and     alternatives of the procedure were thoroughly explained, informed     consent was obtained.  The  endoscope was introduced through the     mouth and advanced to the third portion of the duodenum, without     limitations.  The instrument was slowly withdrawn as the mucosa     was fully examined.     <<PROCEDUREIMAGES>>          Grade I varices were found in the distal esophagus (see image6 and     image9).  Folds were noted (see image5). Slightly enlarged folds     in fundus  Otherwise the examination was normal (see image1,     image2, image3, and image4).    Retroflexed views revealed no     abnormalities.    The scope was then withdrawn from the patient     and the procedure completed.          COMPLICATIONS:  None          ENDOSCOPIC IMPRESSION:     1) Grade I varices in the distal esophagus     2) Mildly enlarged gastric folds     3) Otherwise normal examination     RECOMMENDATIONS:     1) endoscopy          REPEAT EXAM:   1 year(s) EGD (MAC sedation)          ______________________________     [name]. Deatra Ina, MD          CC:           n.     eSIGNED:   [name]. Kaplan at 10/10/2010 11:41 AM          [name], [id number]  Note: An exclamation mark (!) indicates a result that was not dispersed into the flowsheet. Document Creation Date: 10/10/2010 11:42 AM _______________________________________________________________________  (1) Order result status: Final Collection or observation date-time: 10/10/2010 11:35 Requested date-time:  Receipt date-time:  Reported date-time:  Referring Physician:   Ordering Physician: Erskine Emery [phone]) Specimen Source:  Source: Tawanna Cooler Order Number: [id number] Lab site:

## 2010-10-25 LAB — CBC
Hemoglobin: 11.6 g/dL — ABNORMAL LOW (ref 12.0–15.0)
MCHC: 33.7 g/dL (ref 30.0–36.0)
MCV: 90.7 fL (ref 78.0–100.0)
RDW: 14.2 % (ref 11.5–15.5)

## 2010-10-25 LAB — DIFFERENTIAL
Basophils Absolute: 0 10*3/uL (ref 0.0–0.1)
Basophils Relative: 0 % (ref 0–1)
Eosinophils Absolute: 0.1 10*3/uL (ref 0.0–0.7)
Monocytes Absolute: 0.2 10*3/uL (ref 0.1–1.0)
Monocytes Relative: 5 % (ref 3–12)
Neutro Abs: 1.5 10*3/uL — ABNORMAL LOW (ref 1.7–7.7)

## 2010-10-25 LAB — CHROMOSOME ANALYSIS, BONE MARROW

## 2010-10-25 LAB — BONE MARROW EXAM

## 2010-11-06 ENCOUNTER — Other Ambulatory Visit: Payer: Self-pay | Admitting: Gastroenterology

## 2010-11-16 LAB — LIPID PANEL
Cholesterol: 83 mg/dL (ref 0–200)
LDL Cholesterol: 31 mg/dL (ref 0–99)
Triglycerides: 82 mg/dL (ref <150)

## 2010-11-16 LAB — COMPREHENSIVE METABOLIC PANEL
ALT: 19 U/L (ref 0–35)
ALT: 20 U/L (ref 0–35)
AST: 36 U/L (ref 0–37)
AST: 39 U/L — ABNORMAL HIGH (ref 0–37)
Alkaline Phosphatase: 155 U/L — ABNORMAL HIGH (ref 39–117)
CO2: 30 mEq/L (ref 19–32)
Calcium: 8.6 mg/dL (ref 8.4–10.5)
Chloride: 101 mEq/L (ref 96–112)
Creatinine, Ser: 0.67 mg/dL (ref 0.4–1.2)
GFR calc Af Amer: >60 mL/min (ref >60)
GFR calc Af Amer: >60 mL/min (ref >60)
GFR calc non Af Amer: >60 mL/min (ref >60)
Glucose, Bld: 90 mg/dL (ref 70–99)
Sodium: 139 mEq/L (ref 135–145)
Sodium: 143 mEq/L (ref 135–145)
Total Bilirubin: 0.7 mg/dL (ref 0.3–1.2)
Total Protein: 5.1 g/dL — ABNORMAL LOW (ref 6.0–8.3)

## 2010-11-16 LAB — POCT I-STAT, CHEM 8
Calcium, Ion: 1.02 mmol/L — ABNORMAL LOW (ref 1.12–1.32)
Chloride: 99 mEq/L (ref 96–112)
Glucose, Bld: 106 mg/dL — ABNORMAL HIGH (ref 70–99)
HCT: 32 % — ABNORMAL LOW (ref 36.0–46.0)
TCO2: 27 mmol/L (ref 0–100)

## 2010-11-16 LAB — TYPE AND SCREEN
ABO/RH(D): B POS
Antibody Screen: NEGATIVE

## 2010-11-16 LAB — DIFFERENTIAL
Basophils Absolute: 0 10*3/uL (ref 0.0–0.1)
Basophils Relative: 1 % (ref 0–1)
Basophils Relative: 1 % (ref 0–1)
Eosinophils Absolute: 0.1 10*3/uL (ref 0.0–0.7)
Lymphocytes Relative: 21 % (ref 12–46)
Lymphs Abs: 0.9 10*3/uL (ref 0.7–4.0)
Monocytes Absolute: 0.2 10*3/uL (ref 0.1–1.0)
Monocytes Relative: 7 % (ref 3–12)
Neutro Abs: 2.4 10*3/uL (ref 1.7–7.7)
Neutro Abs: 4 10*3/uL (ref 1.7–7.7)
Neutrophils Relative %: 65 % (ref 43–77)
Neutrophils Relative %: 72 % (ref 43–77)

## 2010-11-16 LAB — CBC
Hemoglobin: 9.3 g/dL — ABNORMAL LOW (ref 12.0–15.0)
MCHC: 32.6 g/dL (ref 30.0–36.0)
MCHC: 33.1 g/dL (ref 30.0–36.0)
MCHC: 33.4 g/dL (ref 30.0–36.0)
MCV: 70.7 fL — ABNORMAL LOW (ref 78.0–100.0)
Platelets: DECREASED 10*3/uL (ref 150–400)
Platelets: DECREASED 10*3/uL (ref 150–400)
RBC: 3.46 MIL/uL — ABNORMAL LOW (ref 3.87–5.11)
RBC: 3.91 MIL/uL (ref 3.87–5.11)
RDW: 18.3 % — ABNORMAL HIGH (ref 11.5–15.5)
RDW: 18.7 % — ABNORMAL HIGH (ref 11.5–15.5)
WBC: 3.6 10*3/uL — ABNORMAL LOW (ref 4.0–10.5)
WBC: 4.3 10*3/uL (ref 4.0–10.5)

## 2010-11-16 LAB — TROPONIN I: Troponin I: 0.01 ng/mL (ref 0.00–0.06)

## 2010-11-16 LAB — PROTIME-INR
INR: 1.1 (ref 0.00–1.49)
Prothrombin Time: 14.5 seconds (ref 11.6–15.2)

## 2010-11-16 LAB — CK TOTAL AND CKMB (NOT AT ARMC): CK, MB: 1.9 ng/mL (ref 0.3–4.0)

## 2010-11-16 LAB — CARDIAC PANEL(CRET KIN+CKTOT+MB+TROPI)
CK, MB: 1.8 ng/mL (ref 0.3–4.0)
Relative Index: INVALID (ref 0.0–2.5)
Total CK: 56 U/L (ref 7–177)

## 2010-11-16 LAB — URINALYSIS, ROUTINE W REFLEX MICROSCOPIC
Bilirubin Urine: NEGATIVE
Ketones, ur: NEGATIVE mg/dL
Nitrite: NEGATIVE
Specific Gravity, Urine: 1.007 (ref 1.005–1.030)
Urobilinogen, UA: 0.2 mg/dL (ref 0.0–1.0)

## 2010-11-16 LAB — HEPARIN LEVEL (UNFRACTIONATED): Heparin Unfractionated: 0.15 IU/mL — ABNORMAL LOW (ref 0.30–0.70)

## 2010-11-16 LAB — BRAIN NATRIURETIC PEPTIDE: Pro B Natriuretic peptide (BNP): <30 pg/mL (ref 0.0–100.0)

## 2010-11-16 LAB — MAGNESIUM: Magnesium: 2.3 mg/dL (ref 1.5–2.5)

## 2010-11-16 LAB — APTT: aPTT: 28 seconds (ref 24–37)

## 2010-11-16 LAB — TSH: TSH: 1.194 u[IU]/mL (ref 0.350–4.500)

## 2010-11-16 LAB — URINE MICROSCOPIC-ADD ON

## 2010-11-16 LAB — PHOSPHORUS: Phosphorus: 4 mg/dL (ref 2.3–4.6)

## 2010-11-16 LAB — D-DIMER, QUANTITATIVE: D-Dimer, Quant: 0.52 ug/mL-FEU — ABNORMAL HIGH (ref 0.00–0.48)

## 2010-11-16 LAB — POCT CARDIAC MARKERS
CKMB, poc: <1 ng/mL — ABNORMAL LOW (ref 1.0–8.0)
Troponin i, poc: <0.05 ng/mL (ref 0.00–0.09)
Troponin i, poc: <0.05 ng/mL (ref 0.00–0.09)

## 2010-12-19 NOTE — Op Note (Signed)
[name], FURUKAWA NO.:  [id number]   MEDICAL RECORD NO.:  [id number]          PATIENT TYPE:  INP   LOCATION:  5128                         FACILITY:  [facility]   PHYSICIAN:  Merri Ray. Grandville Silos, M.D.DATE OF BIRTH:  [birth year]   DATE OF PROCEDURE:  04/15/2008  DATE OF DISCHARGE:  12/04/2007                               OPERATIVE REPORT   PREOPERATIVE DIAGNOSES:  1. Symptomatic cholelithiasis.  2. Nonalcoholic steatohepatitis.   POSTOPERATIVE DIAGNOSES:  1. Symptomatic cholelithiasis.  2. Nonalcoholic steatohepatitis.   PROCEDURES:  1. Laparoscopic cholecystectomy with intraoperative cholangiogram.  2. Liver biopsy.   SURGEON:  Merri Ray. Grandville Silos, MD.   ASSISTANT:  Jonne Ply, MD.   ANESTHESIA:  General.   HISTORY OF PRESENT ILLNESS:  Ms. [name] is a 65 year old female who was  scheduled for cholecystectomy and liver biopsy about 2-3 weeks ago.  However, her surgery was canceled due to refractory hypokalemia.  She  worked with Dr. Johnnye Sima to correct that as an outpatient and had some  significant difficulty doing that.  She presented to the Emergency  Department 2 days ago with potassium of 2.9 and continuing right upper  quadrant abdominal pain.  She was admitted to the hospital.  Since that  time, electrolytes have been corrected.  Coags are normal, and we are  preceding today with laparoscopic cholecystectomy.   PROCEDURE IN DETAIL:  Informed consent was obtained.  She was identified  in the preop holding area.  She has been receiving intravenous  antibiotics.  She was brought to the operating room.  General anesthesia  was administered by the anesthesia staff.  Her abdomen was prepped and  draped in sterile fashion.  Supraumbilical area was infiltrated with  0.25% Marcaine with epinephrine.  Supraumbilical incision was made,  subcutaneous tissues were dissected down revealing the anterior fascia.  This was divided sharply and the peritoneal  cavity was entered under  direct vision without difficulty.  A 0 Vicryl pursestring suture was  placed around the fascial opening.  The Hasson trocar was inserted into  the abdomen.  The abdomen was insufflated with carbon dioxide in  standard fashion under direct vision.  An 11 mm epigastric and two 5 mm  lateral ports were placed.  A 0.25% Marcaine with epinephrine was used  at all port sites.  The liver was grossly macronodular consistent with  cirrhosis.  The gallbladder body was retracted superomedially.  The  infundibulum was retracted inferolaterally.  Dissection began laterally  and progressed medially identifying first the anterior branches of  cystic artery.  This was clipped twice proximally, once distally and  divided.  Further dissection continued identifying the cystic duct.  We  continued dissection until a large window was created between the cystic  duct, the infundibulum of the gallbladder and the liver.  Once, we had  excellent visualization, a clip was placed on the infundibular cystic  duct junction.  Small nick was made in the cystic duct and a Reddick  cholangiogram catheter was inserted.  Intraoperative cholangiogram was  obtained demonstrating no common bile duct filling defects and good free  flow of contrast into the duodenum.  Cholangiogram catheter was removed.  Three clips were placed proximally on the cystic duct, and it was  divided.  Further dissection revealed a posterior and larger branch of  the cystic artery.  These was clipped twice proximally, once distally  and divided.  The gallbladder was taken off the liver bed achieving  excellent hemostasis with a Bovie cautery.  The gallbladder was placed  in an EndoCatch bag and removed from the abdomen via the supraumbilical  port site.  The abdomen was copiously irrigated.  We then scored a small  1 cm section with the Bovie cautery to facilitate liver biopsy.  The  central portion of the liver tissue was  grasped with precision grasper  and using a Bovie, we took a sample of tissue for liver biopsy.  Aggressive cauterization was then used on this site to achieve good  hemostasis.  The abdomen was copiously irrigated.  Irrigation fluid  returned clear.  Liver bed remained dry and clips were in good position.  The liver biopsy site was reexamined and was completely dry with no  bleeding.  Two 1/2 pieces of Surgicel were placed one over the biopsy  site in the top of the liver bed and one down deeper in the liver bed.  The irrigation fluid was evacuated.  The ports were then removed under  direct vision and pneumoperitoneum was released.  The Hasson trocar was  removed.  The supraumbilical fascia was closed by tying the 0 Vicryl  pursestring suture with care not to trap any intraabdominal contents.  All 4 wounds were copiously irrigated.  The skin was closed with running  4-0 Vicryl for the supraumbilical and running 4-0 Monocryl subcuticular  stitches for the epigastric and 2 lateral sites.  Sponge, needle, and  instrument counts were correct.  Benzoin, Steri-Strips, and sterile  dressings were applied.  The patient tolerated the procedure well  without apparent complication.  She was taken to the recovery room in  stable condition.      Merri Ray Grandville Silos, M.D.  Electronically Signed     BET/MEDQ  D:  04/15/2008  T:  04/16/2008  Job:  [id number]   cc:   Darcus Austin, D.O.  [facility]

## 2010-12-19 NOTE — Discharge Summary (Signed)
NAMEBENETTA, [name]:  [id number]   MEDICAL RECORD NO.:  [id number]          PATIENT TYPE:  INP   LOCATION:  3703                         FACILITY:  [facility]   PHYSICIAN:  [name], M.D. DATE OF BIRTH:  1946/05/09   DATE OF ADMISSION:  10/23/2008  DATE OF DISCHARGE:  10/25/2008                               DISCHARGE SUMMARY   PRIMARY CARE PHYSICIAN:  Kerry Austin, DO   DISCHARGE DIAGNOSES:  1. Chest pain, ruled out for acute coronary syndrome.  2. Cirrhosis.  3. Thrombocytopenia, stable.  4. Hypertension.  5. Hyperlipidemia.  6. Hypokalemia, resolved.  7. Transaminitis.  8. Tobacco abuse.  9. History of avascular necrosis.  10.Prior history of deep vein thrombosis in 1980 and 1996.   DISCHARGE MEDICATIONS:  1. Cymbalta 60 mg daily.  2. Vytorin 10/40 mg at bedtime.  3. Evista 60 mg daily.  4. Omeprazole 20 mg daily.  5. Potassium chloride 10 mEq daily.  6. Fish oil 1 tablet daily.  7. Aspirin 81 mg daily.  8. Amitriptyline 10 mg daily.  9. Lyrica 100 mg 3 times a day.   DISPOSITION AND FOLLOWUP:  [name] is discharged to home in stable  condition.  I have asked her to call her  primary care physician to make  a hospital followup appointment within the next 2 weeks.  The purpose of  this appointment will be to follow up on her chest pain.   CONSULTATIONS THIS HOSPITALIZATION:  None.   IMAGES AND PROCEDURES PERFORMED DURING THIS HOSPITALIZATION:  1. A chest x-ray on October 23, 2008, that showed chronic interstitial      changes.  2. A CT angiogram of the chest on October 24, 2008, that was negative      for pulmonary embolus or acute cardiopulmonary disease.  A nodular      liver border compatible with cirrhosis, splenomegaly and status      post cholecystectomy.  3. Abdominal ultrasound on October 24, 2008, that showed no sonographic      evidence for ascites.   HISTORY AND PHYSICAL EXAMINATION:  For full details, please refer to  dictation by Dr. Tommy Russell on October 23, 2008, but in brief, [name] is  a pleasant 65 year old woman who has a history of asthma, previous DVTs,  hypertension and hyperlipidemia who presented to the emergency  department with chest pain.  She has never had chest pain like this  before.  Chest pain was radiated in her substernal area that radiated to  her back, described as something got stuck in her chest that was worse  with deep breath and with movement.  She also says that the chest pain  is worse when she is lying in a supine position and after heavy meals.  Because of this, she was brought into the hospital for further  evaluation and management.   HOSPITAL COURSE BY ACTIVE PROBLEMS:  1. Chest pain:  She has now ruled out for an acute myocardial      infarction with 3 sets of negative cardiac enzymes as well as an  EKG that shows no acute ST or T wave changes.  She had a coronary      catheterization in 2002 that showed a completely normal coronary      arteries.  I have elected at this time not to pursue any further      cardiac workup, although she does develop further chest pain given      she does have some risk factors for coronary artery disease, I will      proceed with a stress test.  Also, she had a CT angiogram of the      chest that did not show any evidence for aortic dissection or for      pulmonary embolus.  Given her clinical characteristics like her      chest pain worse in the supine position and feeling like something      has stuck in her chest, I believe that also need to consider      gastroesophageal reflux disease as part of the differential here.      She will be discharged home on a PPI.  2. For her cirrhosis, she is status post biopsy that showed a stage IV      cirrhosis.  Her liver enzymes are currently all within normal      limits with the exception of a low albumin at 2.9 and a slightly      elevated alk phos at 150.  Recommend rechecking a CMET  in 4-6 weeks      to follow.  3. Thrombocytopenia, which is likely secondary to her cirrhosis had      been stable in the mid 90s.  4. Hypertension:  She has maintained excellent blood pressure control      in the hospital.  We have not changed any of her medications.  5. For her hyperlipidemia, fasting lipid panel in the hospital is as      follows:  Total cholesterol 83, triglycerides 82, HDL 36, and an      LDL of 31.  She will continue on her home dose of Vytorin.  6. The rest of her chronic medical issues were not a problem this      hospitalization.   VITAL SIGNS ON THE DAY OF DISCHARGE:  Blood pressure 122/58, heart rate  94, respirations 18, O2 sats 97% on 2 L with a temperature of 98.1.   LABORATORY ON THE DAY OF DISCHARGE:  Sodium 143, potassium 3.7, chloride  105, bicarb 29, BUN 6, creatinine 0.67 with a glucose of 90.  Total  bilirubin 0.9, alk phos 150, AST 36, ALT 19, total protein 5.1 and  albumin of 2.9.  WBC 12.2, hemoglobin 9.2, platelets of 93, and  magnesium level of 2.3 and 3 sets of negative cardiac enzymes.  Studies  and labs pending at time of this dictation is none.      [name], M.D.  Electronically Signed     EH/MEDQ  D:  10/25/2008  T:  10/25/2008  Job:  [id number]   cc:   [name], D.O.

## 2010-12-19 NOTE — H&P (Signed)
NAME:  [name], [name]:  [id number]   MEDICAL RECORD NO.:  [id number]          PATIENT TYPE:  INP   LOCATION:                               FACILITY:  [facility]   PHYSICIAN:  Gaynelle Arabian, M.D.    DATE OF BIRTH:  01-Dec-1945   DATE OF ADMISSION:  07/19/2008  DATE OF DISCHARGE:                              HISTORY & PHYSICAL   CHIEF COMPLAINT:  Right hip pain.   HISTORY OF PRESENT ILLNESS:  The patient is a 65 year old female who is  scheduled for right total hip arthroplasty due to AVN of her right hip.  The patient was initially scheduled for earlier this fall, but had  significant abnormalities in her chemistries and she was reevaluated and  postponed.  The patient subsequently has gone through a gallbladder  attack, had a cholecystectomy.  She has also had some anemia issues and  had a workup with endoscopy and colonoscopy without any significant  findings.  The patient had significant hypokalemia on her initial  preoperative lab work.  The patient is here today for history and  physical for upcoming right total hip arthroplasty by Dr. Wynelle Link on  July 19, 2008 due to the significant AVN and the femoral head  collapse.   PAST MEDICAL HISTORY:  1. Hypertension.  2. Chronic anemia.  3. History of asthma.  4. History of enlarged liver, etiology unknown.  5. History of kidney stones.  6. History of DVT left lower extremity.  7. Recent cholecystectomy.  8. Recent hypokalemia.   PRIMARY CARE PHYSICIAN:  Dr. Rachel Moulds.   GENERAL SURGEON:  Dr. Grandville Silos.   ALLERGIES:  BENADRYL, ATIVAN, INDOCIN, CLARITIN-D, VICODIN, LOPID,  SULFA, LIPITOR, SERZONE.   CURRENT MEDICATIONS:  1. Magnesium 250 mg a day.  2. Klor-Con 20 mEq at 1 tablet a day.  3. Cymbalta 60 mg a day.  4. Omeprazole 20 mg a day.  5. Lyrica 100 mg three times a day.  6. Triamterine/hydrochlorothiazide 75/50 once a day.  7. Evista 60 mg a day.  8. Amitriptyline 10 mg a day.  9. Aspirin  81 mg a day.  10.__________ laxative.  11.Fish oil 1000 mg a day.  12.Iron supplement once a day.  13.Morphine sulfate 15 mg p.r.n.   REVIEW OF SYSTEMS:  Negative for any neurologic issues.  PULMONARY:  She  does have asthma, last episode was 1 year previous without any  hospitalization.  Otherwise she denies any issues related to shortness  of breath, COPD, or tuberculosis.  She does smoke about 1 pack a day of  tobacco.  CARDIOVASCULAR:  Stress test 2 years previous reported to be  normal.  She has stable hypertension.  She denies any chest pains,  irregular heart rhythms, skipping any beats.  GI is positive for history  of bleeding ulcers in the past.  She recently had a cholecystectomy.  She does have an enlarged liver, etiology unknown.  She has recently had  a colonoscopy and an endoscopy to evaluate her chronic anemia issues.  GU:  She does have a history of kidney stones.  She states  she feels she  has small one now.  Endocrine is unremarkable.  Hematologic, she does  report a chronic anemia, etiology unknown.  She does have history of DVT  in her left lower extremity last being 1996.   PAST SURGICAL HISTORY:  Includes cholecystectomy and left hip  replacement.  Hysterectomy.  Multiple tumors removed from her neck.  Recent colonoscopy and endoscopy without any problems with anesthesia.   FAMILY MEDICAL HISTORY:  Father is deceased from lung cancer at 55.  Mother is deceased from congestive heart failure at 71.   SOCIAL HISTORY:  The patient is married.  She currently ambulates with  the use of a walker.  She currently smokes about 1 pack a day.  No  alcohol or drug abuse.  She will be staying with family postop.   PHYSICAL EXAM:  VITAL SIGNS:  Blood pressure is 154/64, pulse of 78 and  regular, respirations are 14 and nonlabored, patient is afebrile.  GENERAL:  This is short-statured, heavy-set female, appears to be  uncomfortable with any attempts at ambulation.  Ambulates  with a walker.  HEENT:  Head was normocephalic.  Pupils equal, round, reactive.  Hearing  is intact.  NECK:  Supple.  No palpable lymphadenopathy.  Good range of motion.  CHEST:  Lung sounds were distant but clear.  No wheezing.  HEART:  Regular rate and rhythm.  ABDOMEN:  Soft.  Bowel sounds present.  She was slightly sore in the  right lateral flank area.  She had no CVA region discomfort.  EXTREMITIES:  Upper extremities had good range of motion for shoulders,  elbows and wrists.  LOWER EXTREMITIES:  Right hip had pain with any attempts of motion.  She  was able to fully extend and she had a difficult time bringing it up to  90 degrees.  She had 0 degrees internal-external rotation limited by  pain.  Her left hip, she can fully extend.  She can flex it up to 110  degrees.  She had about 20 degrees of internal and external rotation  without any discomfort.  She can fully extend both knees, flex them back  to 120 degrees.  Calves were soft.  She had good motion at the ankles.  PERIPHERAL VASCULAR:  Carotid pulses were 2+ with no bruits.  Radial  pulses 2+.  Dorsalis pedis pulses were not palpable.  Posterior tibial  pulses were 1+.  She had trace edema in the lower extremities.  NEURO:  The patient was conscious, alert and appropriate.  She had no  gross neurologic defects.  BREAST, RECTAL AND GU:  Exams were deferred at this time.   IMPRESSION:  1. Right hip avascular necrosis with femoral head collapse.  2. Recent left total hip arthroplasty in 2004.  3. Hypertension.  4. Asthma.  5. Chronic anemia.  6. History of kidney stones.  7. Recent cholecystectomy.  8. History of enlarged liver.  9. History of bleeding ulcers.  10.Dentures, full uppers and partial lowers.   PLAN:  Patient is a planned for a right total hip arthroplasty by Dr.  Wynelle Link at [facility] on July 19, 2008.  The patient has  had a stress test 2 years previous which she reports as being normal.   The patient is also going to see her primary care physician tomorrow for  medical clearance for this upcoming procedure after all of multiple  medical issues that have occurred since her previous surgical date.  The  patient will  undergo all the routine labs and tests for preoperative  evaluation.      Evert Kohl, P.A.      Gaynelle Arabian, M.D.  Electronically Signed    RWK/MEDQ  D:  06/21/2008  T:  06/22/2008  Job:  [id number]

## 2010-12-19 NOTE — H&P (Signed)
NAMEJENNALEE, [name]:  [id number]   MEDICAL RECORD NO.:  [id number]          PATIENT TYPE:  INP   LOCATION:  5128                         FACILITY:  [facility]   PHYSICIAN:  Marland Kitchen T. Hoxworth, M.D.DATE OF BIRTH:  1946-03-20   DATE OF ADMISSION:  04/13/2008  DATE OF DISCHARGE:                              HISTORY & PHYSICAL   CHIEF COMPLAINT:  Abdominal pain and swelling.   HISTORY OF PRESENT ILLNESS:  [name] is a 65 year old white  female with multiple medical problems as listed below.  She has a  several-month history of gradually worsening episodic right upper  quadrant abdominal pain.  This is sometimes related to meals and often  associated with nausea.  The patient has had an ultrasound in August,  which revealed multiple small gallstones.  Also noted was an enlarged  nodule and liver and also some degree of splenomegaly.  The patient  subsequently was apparently scheduled for cholecystectomy by Dr. Georganna Skeans.  The case, however, was cancelled in the holding area due to  marked hypokalemia.  The patient was discharged home.  She is followed  regular by Dr. Rachel Moulds.  History at this point is obtained mainly  from the patient's husband.  The patient has received pain medication in  the emergency room and is not able to give a history due to sedation.  The patient's husband states that she has been having gradually  worsening abdominal discomfort.  This has not been extremely severe, but  significant a gradually worsening.  She complains of discomfort in her  right upper quadrant.  Also low-grade nausea.  She has been having loose  stools.  She also notices abdominal swelling and bloating particularly  in the upper abdomen.  She was in Dr. Franklyn Lor office today.  There  was apparently supposed to be some communication between Dr. Franklyn Lor  office and our office regarding correction of her potassium and  rescheduling surgery, but the  patient and her husband did not hear  anything and were told to come to the emergency room if they did not and  therefore, are now in the emergency room.  She has not had any fever,  chills.  No jaundice.   PAST MEDICAL HISTORY:  Previous surgery includes hysterectomy, then  subsequent bilateral salpingo-oophorectomy.  She had an appendectomy,  left total hip replacement for avascular necrosis, left varicose vein  stripping, history of phlebitis in the left leg, treated with  anticoagulants, benign breast biopsy and renal stent.  She has had  lumbar laminectomy x2.  Medically, she is followed for depression and  chronic pain.  She has elevated cholesterol.  She has been followed  [facility] with a history of early cirrhosis probably secondary  to NASH versus Lipitor.  She has GERD and significant osteoarthritis and  osteopenia.   MEDICATIONS:  1. Cymbalta 60 mg a day.  2. Vytorin 10/40 daily.  3. Evista 60 mg daily.  4. Omeprazole 20 daily.  5. Potassium chloride daily, dose unknown.  6. Lasix 40 mg daily.  7. Fish oil daily.  8.  Aleve p.r.n.  9. Aspirin 81 mg daily.  10.Lyrica 100 mg 3 times a day.  11.Oxycodone as needed for pain.  12.Amitriptyline 10 mg a day.   ALLERGIES:  BENADRYL, SULFA, LIPITOR, VICODIN and E-MYCIN.   SOCIAL HISTORY:  Married, accompanied her husband, smokes cigarettes,  minimal with any alcohol.   FAMILY HISTORY:  Noncontributory.   REVIEW OF SYSTEMS:  GENERAL:  Positive for fatigue and weakness.  RESPIRATORY:  Denies shortness of breath.  CARDIAC:  She has occasional  palpitations.  She apparently has seen a cardiologist and have been  cleared for surgery, but I do not have these records.  Denies history of  chest pain, angina, heart attack.  ABDOMEN:  GI as above.  GU:  No  urinary burning or frequency.  MUSCULOSKELETAL:  She has some bilateral  chronic leg swelling, severe right hip pain, and back pain.  NEUROLOGIC:  No numbness,  weakness, syncope, history of stroke.   PHYSICAL EXAM:  VITAL SIGNS:  Temperature is 98.8, heart rate 85,  respirations 20, blood pressure 162/70.  GENERAL:  She is a mildly obese, white female, drowsy, post-pain meds,  arousable, but cannot give a history.  SKIN:  Warm and dry.  No rash or infection.  HEENT:  No palpable mass or thyromegaly.  Sclerae are nonicteric.  Oropharynx is clear.  Lymph nodes, no cervical, subclavicular, or  inguinal nodes palpable.  LUNGS:  Clear without wheezing or increased work of breathing.  CARDIAC:  Regular mild tachycardia, 2/6 systolic murmur.  Peripheral  pulses are intact.  There is trace lower extremity edema.  No JVD.  ABDOMEN:  Appears moderately distended.  Bowel sounds are hypoactive.  Abdomen is tympanitic without a fluid wave.  There was some moderate  tenderness in the right upper quadrant.  I cannot discern any  organomegaly.  No palpable mass.  EXTREMITIES:  Some chronic venous stasis changes of the lower extremity  on the left and 1+ edema.  NEUROLOGIC:  Due to pain medicine and sedation, she is arousable, will  speak a few words, but cannot perform mental status or neuro exam.   LABORATORY:  White count is 3.9, hemoglobin 10.6, platelet count 69,000.  Electrolytes are abnormal for potassium of 2.9.  BUN and creatinine are  normal.  Glucose 142, LFTs mildly elevated, SGOT 47, SGPT 25, alkaline  phosphatase 159, bilirubin 0.8.  Ultrasound previously as above.   ASSESSMENT AND PLAN:  A 65 year old female with worsening abdominal  pain, right upper quadrant and gallstones.  This is consistent with low-  grade cholecystitis or biliary colic.  She also has a history of  probable cirrhosis secondary to nonalcoholic steatohepatitis and has  thrombocytopenia and some degree of pancytopenia possibly secondary to  this.  She has persistent hypokalemia.  She has been on diuretics.   The patient will be admitted to the hospital for symptom  control.  We  will check a KUB, check INR.  We will replace potassium.  We will likely  ultimately need cholecystectomy and liver biopsy this hospitalization.      Darene Lamer. Hoxworth, M.D.  Electronically Signed     BTH/MEDQ  D:  04/13/2008  T:  04/14/2008  Job:  [id number]   cc:   Darcus Austin, D.O.  [place] Grand[place] Silos, M.D.

## 2010-12-19 NOTE — Op Note (Signed)
NAMEKAPRI, NERO NO.:  [id number]   MEDICAL RECORD NO.:  [id number]          PATIENT TYPE:  INP   LOCATION:  1605                         FACILITY:  [facility]   PHYSICIAN:  Gaynelle Arabian, M.D.    DATE OF BIRTH:  06/04/1946   DATE OF PROCEDURE:  06/28/2008  DATE OF DISCHARGE:                               OPERATIVE REPORT   PREOPERATIVE DIAGNOSIS:  Avascular necrosis, right hip.   POSTOPERATIVE DIAGNOSIS:  Avascular necrosis, right hip.   PROCEDURE:  Right total hip arthroplasty.   SURGEON:  Gaynelle Arabian, M.D.   ASSISTANT:  Arlee Muslim PA-C   ANESTHESIA:  General.   ESTIMATED BLOOD LOSS:  600 mL.   DRAINS:  Hemovac times one.   COMPLICATIONS:  None.   CONDITION:  Stable to recovery room.   CLINICAL NOTE:  Ms. Keil a 65 year old female with osteonecrosis of  the right hip with intractable pain of the hip and progressive  dysfunction.  She has failed nonoperative management and has had a  previous successful left total hip arthroplasty.  She presents now for  right total hip arthroplasty.   PROCEDURE IN DETAIL:  After successful administration of general  anesthetic, she is placed in the left lateral decubitus position with  the right side up and held with the hip positioner.  Right lower  extremity isolated from her perineum with plastic drapes and prepped and  draped in the usual sterile fashion.  Short posterolateral incision is  made with a 10 blade through subcutaneous tissue to the level of fascia  lata which was incised in line with the skin incision.  The sciatic  nerve was palpated and protected and short external rotators isolated  off the femur.  Capsulectomy is performed and the hip was dislocated.  The center of the femoral head is marked and a trial prosthesis is  placed such that the center of the trial head corresponds to the center  of native femoral head.  Osteotomy lines marked on the femoral neck and  osteotomy made with an  oscillating saw.  Femoral head is then removed  and the femur retracted anteriorly to gain acetabular exposure.   Acetabular retractors were placed and labrum and osteophytes removed.  Acetabular reaming starts at 45 mm coursing increments of 2 to 49 mm and  then a 15-mm Pinnacle acetabular shell was placed in anatomic position  and transfixed with two dome screws.  The trial 32-mm neutral +4 liner  was placed.   The femur is prepared with the canal finder and irrigation.  Axial  reaming is performed up to 15.5 mm, proximal reaming to 20 D and the  sleeve machined to a large.  20 D large trial sleeve is placed with a 20  x 15 stem and 36 + 8 neck matching her native anteversion.  A 32+0 head  is placed and the hip is reduced with outstanding stability.  There is  full extension, full external rotation, 70 degrees of flexion, 40  degrees of adduction, 90 degrees of internal rotation and 90 degrees of  flexion at 70 degrees of internal  rotation.  By placing the right leg on  top of the left, it felt as though leg lengths were equal.  The hip was  then dislocated and trials removed.  The permanent apex hole eliminator  is placed in the acetabular shell and the permanent 32 mm neutral +4  Marathon liner was placed.  On the femoral side we placed a 20 D large  sleeve with a 20 x 15 stem and 36 +8 neck.  We placed a 32.0 head and  hip is reduced to the same stability parameters.  The wound was  copiously irrigated with saline solution and the short rotators  reattached to the femur through drill holes.  Fascia lata was closed  over Hemovac drain with interrupted #1 Vicryl.  Subcu closed with #1 and  2-0 Vicryl and subcuticular running 4-0 Monocryl.  Incision is cleaned  and dried and Steri-Strips and bulky sterile dressing applied.  The  drain is hooked to suction and she is placed into a knee immobilizer,  awakened and transported to recovery in stable condition.      Gaynelle Arabian,  M.D.  Electronically Signed     FA/MEDQ  D:  06/28/2008  T:  06/29/2008  Job:  [id number]

## 2010-12-19 NOTE — H&P (Signed)
NAME:  [name], FERRAZ NO.:  [id number]   MEDICAL RECORD NO.:  [id number]          PATIENT TYPE:  INP   LOCATION:  3703                         FACILITY:  [facility]   PHYSICIAN:  Alcide Evener, MD  DATE OF BIRTH:  [birth year]   DATE OF ADMISSION:  10/23/2008  DATE OF DISCHARGE:                              HISTORY & PHYSICAL   PRIMARY CARE PHYSICIAN:  Darcus Austin, D.O.   CHIEF COMPLAINT:  Chest pain.   HISTORY OF PRESENT ILLNESS:  [name] is a 65 year old female with a  complex medical history including multiple coronary risk factors,  previous DVTs and asthma that presents with chest pain.  The patient  states she never had an episode similar to this before.  She was  cleaning her bathroom earlier this afternoon when the pain started  suddenly.  It was located in the substernal area, radiating to her back,  described as something stuck in her chest and a heavy pressure  sensation, worsened with deep breaths and movement.  The pain was  relieved with morphine and nitroglycerin, associated with nausea,  dizziness and shortness of breath, a 10/10 at its worse intensity,  currently 3/10.   The patient was recently seen by her primary care physician for which  she reports to be bronchitis type symptoms.  PCP ordered routine labs  and called the patient yesterday once he received a results stating her  hemoglobin was down to 7.9 and to come to [facility] for transfusion.  She was seen in the emergency department yesterday and hemoglobin  repeated at that time was 8.1 with subsequent 3 units of packed red  cells transfused.   PAST MEDICAL HISTORY:  1. Hypertension.  2. Chronic anemia.  3. Asthma.  4. Tobacco use.  5. Cirrhosis with liver biopsy demonstrating stage IV fibrosis and      grade 2 hepatitis, thought likely to be related to NASH.  6. History of a avascular necrosis.  7. History of asthma.  8. History of kidney stones.  9. History of  cholecystectomy.  10.The patient reports DVT in 1980 and again in 1996 with history of      vein stripping.   MEDICATIONS:  1. Cymbalta 60 mg p.o. daily.  2. Vytorin 10/40 mg p.o. daily.  3. Evista 60 mg p.o. daily.  4. Omeprazole 20 mg p.o. daily.  5. Potassium chloride p.o. daily.  6. Fish oil p.o. daily.  7. Aspirin 81 mg p.o. daily.  8. Lyrica 100 mg p.o. t.i.d.  9. Amitriptyline 10 mg p.o. t.i.d.   ALLERGIES:  BENADRYL, ATIVAN, INDOCIN, CLARITIN-D, VICODIN, LOPID,  SULFA, LIPITOR, SERZONE, ERYTHROMYCIN.   REVIEW OF SYSTEMS:  As per HPI.  All other systems reviewed and  negative.   PAST SURGICAL HISTORY:  1. Cholecystectomy.  2. Left hip replacement.  3. Hysterectomy.  4. She reports multiple tumors removed from neck.  5. History of liver biopsy.  6. Vein stripping of left lower extremity.   FAMILY MEDICAL HISTORY:  Mother deceased at age 36 with congestive heart  failure.  Father deceased age 17 with  history of CVA and lung cancer.   SOCIAL HISTORY:  Patient is married.  She smokes one pack per day and  has a total of approximately 45 pack year history.  She denies any  alcohol or drugs.   PHYSICAL EXAMINATION:  VITAL SIGNS:  Temperature 98.2, blood pressure  107/50, pulse 94, respirations 15, oxygen saturation 98% on room air.  GENERAL:  This is as short-statured, obese female in no acute distress.  HEENT:  She is normocephalic, atraumatic.  Pupils are equally round and  reactive to light and accommodation.  Extraocular movements are intact.  Sclerae is anicteric.  Mucous membranes are moist.  Oropharynx is clear.  NECK:  Supple.  There is JVD of approximately 10 cm, more clearly  visible on the left neck.  There are no carotid bruits.  LUNGS:  There are distant sounds with some expiratory wheezes.  There  are no rhonchi or crackles appreciated.  HEART:  Regular rate and rhythm without any obvious murmurs, gallops or  rubs.  ABDOMEN:  Obese, distended, tender to  palpation, greatest in the right  upper quadrant and epigastric areas.  Bowel sounds are present and  normal.  EXTREMITIES:  The patient has bilateral lower extremity edema.  The left  lower extremity has venous stasis dermatitis changes.  It looks  erythematous, although the patient reports this is unchanged since prior  history of vein stripping.  Legs are tight to touch though both legs are  warm with good distal pulses and good cap refill.  NEUROLOGICAL:  Patient is awake, alert and oriented x3.  Cranial nerves  II-XII are grossly intact.  Motor intact.  Sensation is grossly intact.  Pulses:  Carotids are 2+ bilaterally, dorsalis pedis pulses were 2+  bilaterally.   STUDIES:  EKG shows normal sinus rhythm with a right bundle branch  block.  There are no ST-T changes suggestive of ischemia.  Chest x-ray  impression:  Chronic interstitial changes.  Early infiltrate at left  base cannot be excluded.   LABORATORY DATA:  Sodium 138, potassium 3.2, chloride 99, bicarb 27, BUN  7, creatinine 0.8, glucose 106.  Point of care cardiac markers negative  x2.  D. Dimer positive at 0.52.  Coags within normal limits.  BNP less  than 30.  White blood cell count 5.5, hemoglobin 10 and this is post 2  units of packed red cells given yesterday for a hemoglobin of 8.1.  Urinalysis with trace leukocyte esterase but 0-2 WBCs on microscopy.   ASSESSMENT:  [name] is a 65 year old woman with coronary risk  factors, although cardiac catheterization in 2002 showed normal left  ventricular function with no coronary artery disease, previous history  of DVT x2, and asthma amongst other problems.  She now presents with  chest pain.   ASSESSMENT:  1. Chest pain.  2. Hypertension.  3. Asthma.  4. Anemia.  5. History of DVT.  6. Peripheral neuropathy.  7. Cirrhosis with stage IV fibrosis and grade 2 hepatitis as of 2007,      thought to be secondary to NASH.   PLAN:  Plan will admit the patient to  telemetry.  Repeat  electrocardiogram cycle cardiac enzymes to rule out acute coronary  syndrome.  The patient is currently on a nitroglycerin drip as well as a  heparin drip.  Will discontinue nitroglycerin drip since this initially  did not help, no signs of ischemia on EKG, and two sets of point of care  cardiac markers are  negative.  Will continue with heparin, although this  is unlikely acute coronary syndrome, until a CT angiogram is completed  to ensure there is no pulmonary embolus.  Will order CT angiogram of the  chest to rule out pulmonary embolus as her story is concerning  especially in light of a prior history of DVTs.  Will check LFTs given  history of cirrhosis and abdominal pain that may be reflected as chest  pain.  Will also check a lipase to rule out pancreatitis.  Will treat  empirically with Protonix for GI source.  The patient also has a history  of asthma and in light of cleaning her bathroom with Clorox products and  strong smell, questionable bronchospastic component especially with her  wheezing.  Will treat this with nebulizers.  Will also order an  abdominal ultrasound to follow-up on the patient's cirrhosis, especially  in light of distended abdomen and abdominal pain to check for ascites.  Will also order a 2-D echocardiogram given the patient has chest pain to  assess for wall motion abnormalities.  She also has elevated jugular  venous distention along with distended abdomen and lower extremity  edema, although not new, would like to assess left ventricular function,  pulmonary artery pressures etc.    ATTENDING ADDENDUM: THIS IS to document that I examined the patient and  reviewed her record. The resident discussed the findings and plan with  me. I agree with the assessment and plan as outlined in his thorough and  excellent note above. I agree that her story is more concerning for  possibility of pulmonary embolism, vs asthma exacerabation due to   inhaling chlorox fumes. She also had cirrhosis and I agree that her  abdominal distention is concerning for development of ascites though I  do not think she has SBP at present.      Alphia Moh, MD  Electronically Signed      Alcide Evener, MD  Electronically Signed    MA/MEDQ  D:  10/23/2008  T:  10/24/2008  Job:  [id number]   cc:   Darcus Austin, D.O.

## 2010-12-19 NOTE — Discharge Summary (Signed)
[name], MENNEN NO.:  [id number]   MEDICAL RECORD NO.:  [id number]          PATIENT TYPE:  INP   LOCATION:  5128                         FACILITY:  [facility]   PHYSICIAN:  Merri Ray. Grandville Silos, M.D.DATE OF BIRTH:  March 31, 1946   DATE OF ADMISSION:  04/13/2008  DATE OF DISCHARGE:  04/17/2008                               DISCHARGE SUMMARY   DISCHARGE DIAGNOSES:  1. Symptomatic cholelithiasis.  2. Nonalcoholic steatohepatitis.  3. Nodular changes of the liver, suspicious for cirrhosis.  4. Hypokalemia.  5. Respiratory insufficiency.  6. Volume overload.   HISTORY OF PRESENT ILLNESS:  Ms. Russell is a 65 year old female who I  had scheduled for a laparoscopic cholecystectomy for symptomatic  cholelithiasis.  Liver biopsy was planned.  She developed severe  hypokalemia, so her initial surgery date was cancelled and she was being  managed by her primary care physician, Dr. Rachel Moulds.  Dr. Johnnye Sima  was working very hard on correcting her electrolytes; however, she began  having significant abdominal pain and was admitted by my partner, Dr.  Excell Seltzer.   HOSPITAL COURSE:  The patient's initial potassium was 2.9, it was  gradually corrected.  She had some respiratory insufficiency and  bronchospasm, treated with bronchodilators.  She had a notable  pancytopenia including thrombocytopenia and leukopenia thought to be  possibly secondary to her cirrhosis.  Her electrolytes were corrected.  Her respiratory status improved, and she went on to undergo laparoscopic  cholecystectomy with intraoperative cholangiogram and also liver biopsy  on April 15, 2008.  Her liver was macronodular on appearance and  pictures were taken in the operating room.  Postoperatively, she did  better.  She gradually advanced her diet.  Her hypokalemia remained  fairly well controlled.  She continued to have some generalized edema,  however, that was fairly well tolerated and she was  discharged home in  stable condition on April 17, 2008, with plans to follow up very  soon with Dr. Johnnye Sima, again for generalized body edema and  electrolyte abnormality.   DISCHARGE DIET:  Low fat.   DISCHARGE ACTIVITY:  No lifting.   DISCHARGE MEDICATIONS:  List is unavailable at time of this dictation.   FOLLOWUP:  With myself in 2 weeks.      Merri Ray Grandville Silos, M.D.  Electronically Signed     Merri Ray. Grandville Silos, M.D.  Electronically Signed    BET/MEDQ  D:  05/20/2008  T:  05/21/2008  Job:  [id number]   cc:   Darcus Austin, D.O.
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
## 2010-12-22 NOTE — H&P (Signed)
Kerry, Russell              ACCOUNT NO.:  [id number]   MEDICAL RECORD NO.:  [id number]          PATIENT TYPE:  INP   LOCATION:  1827                         FACILITY:  [facility]   PHYSICIAN:  Mobolaji B. Bakare, M.D.DATE OF BIRTH:  May 17, 1946   DATE OF ADMISSION:  07/24/2005  DATE OF DISCHARGE:                                HISTORY & PHYSICAL   CHIEF COMPLAINT:  Cough and shortness of breath for 2 to 3 days.   HISTORY OF PRESENT ILLNESS:  [name] is a 65 year old Caucasian female  with greater than 60 pack years of smoking.  Her illness started 4 days ago  with sore throat, fever, and chills.  It gradually progressed to cough and  shortness of breath.  She also developed wheezing.  She has not used any  antibiotic since onset of symptoms.  She was finding it difficult to breathe  and presented to the emergency department.  She was brought in by EMS.  She  on initial evaluation was very wheezy, and she responded to nebulization  with albuterol and Atrovent.  She is currently feeling much better.  The D-  dimer is normal.  Chest x-ray showed chronic changes without infiltrate or  pneumonia.   REVIEW OF SYSTEMS:  She denies any vomiting, but she has been having nausea  and headaches.  The headache is generalized.  Cough is nonproductive of  sputum.  She does have pleuritic chest pain on coughing over the sternum.  She experiences dizziness without syncope when coughing in paroxysms.  She  has no abdominal pain or diarrhea.  The patient's appetite is poor.  She has  sinus problems with nasal drip.  She has no dysuria.  She occasionally gets  stress incontinence, particularly when coughing.   PAST MEDICAL HISTORY:  1.  Recurrent bronchitis, about 2 to 3 times a year.  2.  Elevated liver enzymes secondary to statin.  3.  Chronic low back pain status post 2 back surgeries.  4.  Phlebitis, left lower extremity.  5.  History of deep vein thrombosis in 1976 and 1996.  6.   Hypercholesterolemia.  7.  Osteoporosis.  8.  Right renal artery stenosis.  9.  Depression.  10. History of asthma.   PAST SURGICAL HISTORY:  1.  Left vein stripping.  2.  Back surgery x2.  3.  Right shoulder surgery with prosthesis.  4.  Left shoulder surgery.  5.  Hysterectomy in 1974.  6.  Renal artery stent.  7.  Bilateral oophorectomy.  8.  Appendectomy.  9.  Tonsillectomy.   CURRENT MEDICATIONS:  1.  Crestor 10 mg p.o. daily.  2.  Aspirin.  3.  Prednisone 10 mg, 5 tablets, tapering dose.  These were started last      Friday (4 days ago) for back pain by the patient's neurosurgeon.  4.  Gabapentin 600 mg t.i.d.  5.  Amitriptyline 10 mg at bedtime.  6.  Hydrochlorothiazide which the patient uses infrequently.   ALLERGIES:  Multiple drug allergies with which the patient does not know the  type of reactions including Benadryl, Claritin, erythromycin,  Lopid, niacin,  Vicodin, Zocor, and sulfa.   FAMILY HISTORY:  The patient is married and lives with her husband.  She has  3 children.  Her father died from lung cancer, and prior to his death he had  a stroke.  Her mother is alive and has no known medical illness.  She has 1  sister and 1 brother.  The brother has diabetes mellitus.   SOCIAL HISTORY:  The patient is currently smoking 3 packs per day, and she  has been smoking for over 30 years.  She does not drink alcohol.  She does  not use illicit drugs.  She is a Secretary/administrator.  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   PHYSICAL EXAMINATION:  INITIAL VITAL SIGNS:  Blood pressure 204/76,  currently 150/96 (the patient has not been taking medications).  Temperature  98.0.  Pulse 108.  Respiratory rate 18.  O2 saturation 91% on room air.  GENERAL:  On examination, she appears comfortable, not in respiratory  distress.  No audible wheeze.  HEENT:  Normocephalic and atraumatic head.  Extraocular muscles and movement  intact.  She has essential telangiectasia.   She is not cyanotic.  Oropharynx  with no exudates.  Hyperemic pharynx.  No enlarged tonsils.  NECK:  No carotid bruit.  No elevated JVD.  No thyromegaly.  LUNGS:  With reduced air entry bilaterally.  Bilateral expiratory rhonchi.  No crackles.  HEART:  S1 and S2 regular.  No murmur and no gallop.  ABDOMEN:  Not distended.  Mild epigastric tenderness without rebound or  guarding.  No palpable organomegaly.  Bowel sounds present.  EXTREMITIES:  Chronic hyperpigmentation over left lower extremity.  No  edema.  No calf tenderness.  Dorsalis pedis pulses palpable bilaterally.  No  cyanosis.  CENTRAL NERVOUS SYSTEM:  Muscle power 5/5 in all limbs.  Cranial nerves  intact.  SKIN:  No rash.  No petechiae.   INITIAL LABORATORY DATA:  D-dimer 0.30.  Sodium 128, potassium 3.7, chloride  103, bicarbonate 28, glucose 94, BUN 6, creatinine 0.6, bilirubin 0.9,  alkaline phosphatase 129, AST 66, ALT 68, total protein 6.0, albumin 3.8,  calcium 8.6.  White cells 5.5, hemoglobin 14.3, hematocrit 41.8, platelets  128, neutrophils 83, lymphocytes 14%.  BNP 56.7.  Chest x-ray with no acute  cardiopulmonary disease, chronic lung changes.  ECG not available at this  time.   ASSESSMENT/PLAN:  Kerry Pieratt is a 65 year old Caucasian female with  significant history of cigarette smoking who has been having a recurrent  acute bronchitis, ranging 2 to 3 per year.  She is presenting with sore  throat, cough, shortness of breath, and wheezing.  She responded well to  nebulization.   1.  Chronic obstructive pulmonary disease with exacerbation.  She needs to      have a pulmonary function test done when this acute episode resolves;      however, during this hospitalization, I will treat with IV Solu-Medrol      60 mg q.6 h., nebulization with Atrovent 0.5 mg q.4 h. and Xopenex 0.63      mg q.6 h. and q.3 h. p.r.n.  I will obtain sputum for Gram stain and      culture.  For Pneumovax and flu vaccine and give as  indicated. 2.  Acute pharyngitis.  This is probably viral.  She does not have an      elevated white cell count and no documented fever  yet.  We obtained      throat swab for culture, rapid strep antigen test, influenza A and B      nasal swabs.  Will treat symptomatically with Tylenol and IV fluid.      Normal saline 100 mL per hour for 1 liter.  3.  Pleuritic chest pain which is aggravated by coughing.  The patient has a      normal D-dimer.  She has risk factors for CAD.  We will obtain serial      cardiac enzymes and ECG.  We will treat with oxycodone 5 mg p.o. q.4 h.      p.r.n. and Tylenol as needed.  4.  History of abnormal liver enzymes secondary to statin.  The patient      states that she is currently on Crestor.  I will order her off of      Crestor for now and check fasting lipid profile.  5.  Tobacco abuse.  The patient will be counseled for tobacco addiction, and      while in hospital we will give nicotine patch 21 mg daily.  6.  Thrombocytopenia.  The etiology is unclear.  Will monitor CBC.  7.  Multiple drug allergies.  8.  Hypertension.  The patient has been noncompliant with medication.  I      will start her on 2-drug medications including hydrochlorothiazide 25 mg      p.o. daily and Norvasc 5 mg p.o. daily.  Her blood pressure was very      elevated on arrival.  9.  Chronic low back pain.  We will continue the patient's treatment with      gabapentin and amitriptyline.  Use oxycodone and Dilaudid p.r.n.  10. Gastrointestinal prophylaxis.  We will give Protonix 40 mg p.o. daily.  11. Deep vein thrombosis prophylaxis.  Lovenox 40 mg subcutaneous daily.  12. Peripheral neuropathy.  Continue gabapentin.      Mobolaji B. Maia Petties, M.D.  Electronically Signed     MBB/MEDQ  D:  07/24/2005  T:  07/24/2005  Job:  [id number]   cc:   Darcus Austin, D.O.  Fax: [phone]
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
## 2010-12-22 NOTE — Op Note (Signed)
TNAMEKIMBRA, MARCELINO                       ACCOUNT NO.:  [id number]   MEDICAL RECORD NO.:  [id number]                   PATIENT TYPE:  AMB   LOCATION:  DAY                                  FACILITY:  [facility]   PHYSICIAN:  Dutch Gray, M.D.                    DATE OF BIRTH:  11/05/1945   DATE OF PROCEDURE:  04/14/2002  DATE OF DISCHARGE:                                 OPERATIVE REPORT   PREOPERATIVE DIAGNOSIS:  Right ureteropelvic junction obstruction.   POSTOPERATIVE DIAGNOSIS:  Right ureteropelvic junction obstruction.   PROCEDURES:  1. Cystoscopy.  2. Right retrograde pyelogram.  3. Right Accusize endopyelotomy.  4. Right ureteral stent placement.   SURGEON:  Domingo Pulse, M.D.   RESIDENT:  Raynelle Bring, M.D.   ANESTHESIA:  General.   DRAINS:  Right ureteral 14 x 7-French endopyelotomy stent.   INDICATIONS:  The patient is a 65 year old white female who has had chronic  intermittent right-sided flank pain.  A CT scan was initially performed,  which demonstrated a dilated right renal pelvis, consistent with a possible  UPJ obstruction.  A Lasix Renogram was then performed, which demonstrated a  dilated renal pelvis with slightly decreased function on that side.  However, the patient did appear to have normal washout on that side.  It was  felt that the patient likely had an intermittent UPJ obstruction, after  further evaluating her fluoroscopically.  After discussing the options with  the patient, she elected to proceed with an Accusize endopyelotomy.  It was  discussed that this would be performed in a retrograde fashion.  The  potential risks and benefits of the procedure were explained to the patient,  and she consented.   DESCRIPTION OF PROCEDURE:  The patient was taken to the operating room and a  general anesthetic was administered.  She was placed in the dorsal lithotomy  position, administered preoperative antibiotics, and prepped and draped in  the  usual sterile fashion.  Next, cystoscopy was performed with the 12-  degree lens. This revealed bilateral ureteral orifices to be in the normal  anatomic position and effluxing clear urine.  Examination of the remainder  of the bladder demonstrated no evidence of any tumor, stones or other  mucosal pathology.  Attention was then turned to the right ureteral orifice  and a open-ended 6-French stent was used to perform a retrograde pyelogram.  This demonstrated a normal-caliber ureter up to the level of the UPJ, where  a narrowed segment was identified.  There was also noted to be a large  dilated renal pelvis.  There was not significant calyectasis.  a 0.035  guidewire was then placed up into the renal pelvis under fluoroscopic  guidance.  A ureteral access sheath was then placed up the right ureter, to  a point just below the UPJ.  The Accusize balloon was then placed up over  the guidewire.  The cutting wire portion of this device was placed directly  lateral.  Once placed in good position at the level of the UPJ, the balloon  was inflated with 2.2 cc of contrast.  The stent initially migrated  cranially and the balloon was deflated and the cutting wire was brought down  to a more appropriate level.  Once in appropriate position, the balloon was  then reinflated with contrast and was noted to be in good position at the  level of the UPJ.  There was noted to be a small area of waste along the  site that was to be cut.  Cutting current was then instituted for five  seconds.  The balloon was kept inflated for 10 min and then let down.  Extravasation of contrast was seen in the area of the proximal ureter.  The  Accusize device was then backed out over the guidewire, with care to keep  the wire in good position in the renal pelvis.  The ureteral access sheath  was also removed.  A 14 x 7-French endopyelotomy stent was then place up,  with the proximal curl in the renal pelvis and the distal curl  in the  bladder.  The bladder was then emptied and the procedure was ended.  There  were no complications and the patient appeared to tolerate the procedure  well.  She was able to be awakened and transferred to the recovery unit in  satisfactory condition.   Please note that Dr. Alona Bene was the operating surgeon and was present  and participated in this entire procedure.                                               Dutch Gray, M.D.    LB/MEDQ  D:  04/14/2002  T:  04/14/2002  Job:  [id number]

## 2010-12-22 NOTE — Discharge Summary (Signed)
NAME:  [name], [name]                        ACCOUNT NO.:  [id number]   MEDICAL RECORD NO.:  [id number]                   PATIENT TYPE:  INP   LOCATION:  0460                                 FACILITY:  [facility]   PHYSICIAN:  Gaynelle Arabian, M.D.                 DATE OF BIRTH:  [birth year]   DATE OF ADMISSION:  05/03/2003  DATE OF DISCHARGE:  05/07/2003                                 DISCHARGE SUMMARY   ADMISSION DIAGNOSES:  1. Avascular necrosis, left hip.  2. Hypercholesterolemia.  3. Osteoporosis.  4. Back pain.  5. Depression.  6. Asthma.  7. History of bronchitis.  8. Surgical menopause.  9. History of elevated liver enzymes.  10.      History of deep vein thrombosis x2.   DISCHARGE DIAGNOSES:  1. Avascular necrosis, left hip, status post left total hip replacement     arthroplasty.  2. Mild postoperative blood loss anemia (did not require transfusion.  3. Hypercholesterolemia.  4. Osteoporosis.  5. Back pain.  6. Depression.  7. History of bronchitis.  8. Surgical menopause.  9. History of elevated liver enzymes.  10.      History of deep vein thrombosis x2.   PROCEDURE:  The patient was taken to the OR on May 03, 2003, and  underwent a left total hip arthroplasty.  Surgeon was Dr. Gaynelle Arabian,  assistant Arlee Muslim, P.A.-C.  Anesthesia general.  Estimated blood loss  300 cc.  Hemovac drain x1.   BRIEF HISTORY:  The patient is a 65 year old female well known to Dr. Gaynelle Arabian, having a history of avascular necrosis of the left femoral head  with subtotal femoral head involvement with this process.  She has had  severe intractable pain.  Pain is getting worse.  Over the past several  months she has been refractory to conservative management.  She presents now  for a hip replacement.   LABORATORY DATA:  CBC on admission showed a hemoglobin of 13.9, hematocrit  of 39.4, white cell count of 4.9, red cell count of 4.6, differential all  within normal  limits.  Postoperative H&H 10.9 and 31.6.  Last noted H&H 9.6  and 27.8.  PT and PTT on admission were 12.4 and 27, respectively, with an  INR of 0.9.  Serial prothrombin times were followed.  Last noted PT and INR  were 18.2 and 1.8.  Chem panel on admission did show some elevated liver  enzymes preop with an AST of 40, ALT of 51, ALP of 125.  Serial BMETs were  followed.  A couple of days after surgery electrolytes remained within  normal limits.  She did have a repeat chem panel on May 06, 2003.  The  AST stayed elevated, it went up from 40 to 74, the ALT went down from 51 to  a normal level of 32, the ALP went from a high level of 125  to a normal  level of 79.  Urinalysis on admission was negative.  Blood group type B  positive.  Hip films on April 28, 2003, subtle, but symmetrical areas of  subchondral loosening of the femoral heads which could be seen with early  avascular necrosis.  Hip and pelvis films on May 03, 2003, left hip  arthroplasty.  There is an old EKG on the chart dated March 24, 2001, sinus  rhythm, rate 98, accelerated AV conduction, bifascicular block, right bundle  branch block, and LTFE, right atrial enlargement.  This is a confirmed EKG  read by Dr. Aldona Bar.   HOSPITAL COURSE:  The patient was admitted to [facility], taken to  the ER, underwent the above stated procedure without complication.  The  patient tolerated the procedure well.  Later he was transferred to the  recovery room and then to the orthopaedic floor for continued postoperative  care.  Vital signs were followed.  The patient initially placed on PCA  analgesia for pain control following surgery, supplemented by p.o. meds.  She was given 24 hours of postoperative IV Ancef, placed on Coumadin for  three weeks, placed touchdown weightbearing.  PT and OT were consulted to  assist with gait training, ambulation, and ADLs.  Hemovac drain placed at  time of surgery was pulled  on postoperative day #1 without difficulty.  She  was running a slight temperature on postoperative day #1, of 100.6,  encouraged incentive spirometer.  Antipyretics were needed.  By day #2, she  was feeling a little bit better, she had been weaned over to p.o. meds, so  PCA was discontinued along with the IVs.  Her followup BMETs were doing  well.  Her hemoglobin was 10.5.  She was starting to get up with therapy by  day #3.  She was doing a little bit better. She did have a little bit of  burning and tingling pain on day #2, was still having some on day #3.  This  was felt to be related to her back pain.  Her hip discomfort actually in the  lateral incisional region appears to be doing a little bit better from  postoperatively.  The incision was observed on day #2, with the dressing  change.  It was followed daily.  Incision was healing well.  On day #3, we  also rechecked her chem panel because of her elevated liver enzymes.  Liver  enzymes as above in the laboratory data section of this chart.  An ALT and  ALP actually came down with elevation of the AST.  Continued with her  therapy.  By the following day, postoperative day #4, she was doing much  better.  She still had a little bit of burning pain which was felt to be  more nerve pain.  She is going to follow up with her neurosurgeon in the  near future from her previous back problems who is followed by her  neurosurgeon.  With regards to her hip, she is doing quite well.  She is  actually moving around much better with physical therapy.  It was decided  that the patient could be discharged home at that time.   DISCHARGE PLAN:  The patient is discharged home on May 07, 2003.   DISCHARGE DIAGNOSES:  Please see above.   DISCHARGE MEDICATIONS:  1. Coumadin.  2. Percocet.  3. Robaxin.   DIET:  Low sodium and low cholesterol diet.  ACTIVITY:  Touchdown weightbearing, home  health PT and home health nursing  through  [facility].  Hip precautions.   FOLLOWUP:  Two weeks from surgery, call the office for an appointment at [phone]  [phone].    DISPOSITION:  Home.  She may start showering.   CONDITION ON DISCHARGE:  Improved.     Alexzandrew L. Dara Lords, P.A.              Gaynelle Arabian, M.D.    ALP/MEDQ  D:  06/07/2003  T:  06/07/2003  Job:  [id number]

## 2010-12-22 NOTE — Assessment & Plan Note (Signed)
[place]                           GASTROENTEROLOGY OFFICE NOTE   NAME:[name], [name]                     MRN:          [id number]  DATE:06/07/2006                            DOB:          11/03/1945    PROBLEM:  Cirrhosis.   REASON:  [name] has returned following percutaneous liver biopsy.  This  demonstrated stage IV fibrosis and grade 2 hepatitis.  She has macrovascular  steatosis.  Findings are compatible with early cirrhosis.   [name] has no current GI complaints.   PHYSICAL EXAMINATION:  VITAL SIGNS:  Pulse 82, blood pressure 120/62, weight  162.   IMPRESSION:  Cirrhosis.  I suspect this is related to NASH.   RECOMMENDATIONS:  The patient will attempt weight loss.  This is the only  reliable therapy for NASH, though she already has a well established  cirrhosis.  There is no evidence for portal hypertension at this time.  I  will follow her in 6 months.     [name]. Deatra Ina, MD,FACG  Electronically Signed    RDK/MedQ  DD: 06/07/2006  DT: 06/07/2006  Job #: [id number]   cc:   Darcus Austin, D.O.

## 2010-12-22 NOTE — Assessment & Plan Note (Signed)
[facility]                           GASTROENTEROLOGY OFFICE NOTE   NAME:Moultry, JAZZLYNN RAWE                     MRN:          [id number]  DATE:05/27/2006                            DOB:          Oct 12, 1945    PROBLEM:  Abdominal pain.   HISTORY OF PRESENT ILLNESS:  Ms. Scovell has returned for scheduled GI  followup.  She continues to complain of sharp pain that is in the right  upper quadrant though quite laterally.  It may last hours at a time.  It may  be slightly worsened postprandially.  Serologies for hepatitis B and C were  negative.  Other blood work listed in [employer] note was all negative.  She appears to have cryptogenic cirrhosis with probable early portal  hypertension.  She is not a drinker.  Etiology is unclear.   PHYSICAL EXAMINATION:  VITAL SIGNS:  Pulse 80. Blood pressure 142/60. Weight  161.   IMPRESSION:  1. Nonspecific right upper quadrant pain.  2. Cryptogenic cirrhosis.   RECOMMENDATIONS:  1. Levbid 0.375 mg twice a day p.r.n.  2. Percutaneous liver biopsy with the hope that this may determine an      etiology that could possibly be treated.       [name]. Deatra Ina, MD,FACG      RDK/MedQ  DD:  05/27/2006  DT:  05/28/2006  Job #:  [id number]   cc:   Darcus Austin, D.O.

## 2010-12-22 NOTE — Op Note (Signed)
NAME:  [name], [name]                        ACCOUNT NO.:  [id number]   MEDICAL RECORD NO.:  [id number]                   PATIENT TYPE:  INP   LOCATION:  0460                                 FACILITY:  [facility]   PHYSICIAN:  Gaynelle Arabian, M.D.                 DATE OF BIRTH:  [birth year]   DATE OF PROCEDURE:  05/03/2003  DATE OF DISCHARGE:                                 OPERATIVE REPORT   PREOPERATIVE DIAGNOSIS:  Avascular necrosis, left hip.   POSTOPERATIVE DIAGNOSIS:  Avascular necrosis, left hip.   OPERATION/PROCEDURE:  Left total hip arthroplasty.   SURGEON:  Gaynelle Arabian, M.D.   ASSISTANT:  Alexzandrew L. Dara Lords, P.A.   ANESTHESIA:  General.   ESTIMATED BLOOD LOSS:  300 mL.   DRAINS:  Hemovac x1.   COMPLICATIONS:  None.   CONDITION:  Stable to recovery room.   BRIEF CLINICAL NOTE:  [name] is a 65 year old female with osteo  necrosis of her left femoral head with subtotal femoral head involvement  with the process.  She has had severe intractable pain, getting  progressively worse over the past month or two.  She presents now for total  hip arthroplasty secondary to intractable pain.   DESCRIPTION OF PROCEDURE:  After the successful administration of general  anesthetic, the patient was placed in the right lateral decubitus position  with the left side up and held with the hip positioner.  Left lower  extremity was isolated from her perineum with plastic drapes and prepped and  draped in the usual sterile fashion.   A mini posterolateral incision was made with a 10-blade through the  subcutaneous tissue to the level of the fascia lata which was incised in  line with the skin incision.  Sciatic nerve was palpated and protected and  short rotators isolated off the femur.  The capsulectomy was then performed.  The hip dislocated in the center of the femoral head and marked.  Trial  prosthesis is placed such that the center of the trial head corresponds to  the center of her native femoral head.  Osteotomy line is marked on the  femoral neck and osteotomy is made with an oscillating saw.   The femur was then retracted anteriorly to gain acetabular exposure.  The  acetabular retractors were placed and the labrum removed.  She had a lot of  hypertrophic soft tissue in the fovea which is also removed.  We started  reaming with a 43 coursing increments to a 47 and placed a 48 mm Pinnacle  cup in anatomic position and transfixed it with dome screws.  Trial 28 mm  neutral liner was placed.   The femur was addressed first with the canal finder, then axial reaming.  We  reamed up to 13.5 mm and then proximal reamed to 18D and machine sleeve to a  large.  An 18D large trial sleeve is placed.  We  placed an 18 x 13 stem,  first a 36 + 8 neck to match her native anteversion.  With this neck, her  length was too long.  I ended up trying a 36 standard which was able to be  reduced but still she was too tight with that and went to a 30 + 4 neck with  a 28 + 0 head.  This was easily reduced and leg lengths were essentially  equal when I placed the left leg on top of the right.  She had outstanding  stability, full extension, full external rotation, 70 degree flexions, 40  degrees adduction, 90 degrees internal rotation, 90 degrees flexion, 70  degrees internal rotation.  All the trials were then removed and the  permanent apex hole eliminator was placed into the acetabular shell.  A  permanent 28 mm neutral Marathon liner was placed.  I then placed the 18D  large sleeve into the proximal femur and 18 x 30 stem and 30 + 4 neck,  matching her native anteversion.  I trialed a + 0 head again and this time I  was not thrilled with the soft tissue laxity and went to 28 + 6 permanent  head and she had outstanding stability, even better than the trial.  This  did not effectively lengthen her leg as the leg lengths remained the same  when I placed the left on top of  the right.  The wound was then copiously  irrigated with antibiotic solution and short rotators reattached to the  femur through drill holes.  Fascia lata was closed over a Hemovac drain with  interrupted #1 Vicryl, subcu closed with interrupted #1 and 2-0 Vicryl,  subcuticular running 4-0 Monocryl.  Incision was cleaned and dried and Steri-  Strips and a bulky sterile dressing applied.  She was then placed into a  knee immobilizer, awakened and transported to recovery in stable condition.                                                 Gaynelle Arabian, M.D.    FA/MEDQ  D:  05/03/2003  T:  05/03/2003  Job:  [id number]

## 2010-12-22 NOTE — Discharge Summary (Signed)
[name], [name]:  [id number]   MEDICAL RECORD NO.:  [id number]          PATIENT TYPE:  INP   LOCATION:  6706                         FACILITY:  [facility]   PHYSICIAN:  Lolita Lenz, M.D.DATE OF BIRTH:  February 07, 1946   DATE OF ADMISSION:  07/24/2005  DATE OF DISCHARGE:  07/26/2005                                 DISCHARGE SUMMARY   PRIMARY CARE PHYSICIAN:  Darcus Austin, D.O.   DISCHARGE DIAGNOSES:  1.  Influenza infection.  2.  Bronchitis.  3.  Chronic obstructive pulmonary disease exacerbation.  4.  Continued tobacco use.  5.  History of hypertension.   DISCHARGE MEDICATIONS:  New medications:  1.  Prednisone taper 20 mg tablets, three tablets daily for two days, two      tablets daily for two days, one tablet daily for two days, and half a      tablet daily for two days and finish.  2.  Doxycycline 100 mg one tablet p.o. b.i.d. x7 days.   The patient is to resume her home medications, which include:  1.  Neurontin 600 mg p.o. t.i.d.  2.  Amitriptyline 10 mg p.o. at night.  3.  Hydrochlorothiazide 25 mg p.o. daily.  4.  ___________.  5.  Norvasc 5 mg p.o. daily.   Follow up in one to two weeks with Dr. Johnnye Sima.   BRIEF HOSPITAL COURSE:  The patient is a 65 year old with a long history of  tobacco use, who was admitted with fever and shortness of breath, myalgias.  Evaluation revealed influenza infection.  The patient also had wheezing and  respiratory compromise consistent with COPD exacerbation.  She was  empirically started on steroids, nebulizers, oxygen and supportive care.  By  hospital day #3 she had defervesced.  Group A strep was negative.  Respiratory culture was negative. Urine culture was negative.  The patient  will be discharged home, where she is advised to rest, take plenty of  fluids, use Tylenol as needed for fever, throat lozenges, throat sprays as  well.  She is to avoid close contact with family members for the next  two to  three days.   CONDITION ON DISCHARGE:  Improved.   PHYSICAL EXAMINATION AT TIME OF DISCHARGE:  GENERAL:  She was in no  distress.  VITAL SIGNS:  Temperature was 97.3, blood pressure was 137/69.  LUNGS:  Clear.  CARDIAC:  Heart rate is regular.  HEENT:  Oropharynx is red and erythematous.  NECK:  Supple with no lymphadenopathy.      Lolita Lenz, M.D.  Electronically Signed     RLK/MEDQ  D:  07/26/2005  T:  07/28/2005  Job:  [id number]

## 2010-12-22 NOTE — Assessment & Plan Note (Signed)
[facility]                           GASTROENTEROLOGY OFFICE NOTE   NAME:Russell, Kerry BERNE                     MRN:          [id number]  DATE:03/28/2006                            DOB:          03/06/1946    REASON FOR CONSULTATION:  Abdominal pain.   Ms. [name] is a 65 year old white female referred through the courtesy of  Dr. Johnnye Sima for evaluation.  For the last three months, she has been  complaining of persistent upper and mid-epigastric pain.  Pain is worse  postprandially.  She denies nausea or vomiting.  She does have pyrosis for  which she takes Tums.  There has been no change in bowel habits.  She is on  no gastric irritants including nonsteroidals.  She denies melena or  hematochezia.   PAST MEDICAL HISTORY:  Is pertinent for hypertension and asthma.  She has  arthritis, depression, a history of what sounds like DVTs.  She has coronary  artery disease and she is status post angioplasty.  She is status post  appendectomy.  She has a history of adenomatous polyps.  Last exam was 2003,  which was normal.   MEDICATIONS:  1. Lasix.  2. Evista.  3. Baby aspirin.  4. Vytorin.  5. Lyrica.  6. Cymbalta.  7. Amitriptyline.  She has been on these medicines for a long period of time.   She has no allergies.   She smokes a pack and a half a day.  She does not drink.  She is married,  retired.   REVIEW OF SYSTEMS:  Positive for joint pains and sleeping problems and night  sweats.   PHYSICAL EXAMINATION:  Pulse:  60.  Blood pressure:  126/68.  Weight:  158.  HEENT: EOMI. PERRLA. Sclerae are anicteric.  Conjunctivae are pink.  NECK:  Supple without thyromegaly, adenopathy or carotid bruits.  CHEST:  Clear to auscultation and percussion without adventitious sounds.  CARDIAC:  Regular rhythm; normal S1 S2.  There are no murmurs, gallops or  rubs.  ABDOMEN:  She has tenderness in the upper epigastrium to light palpation.  There is  fullness in her abdominal wall measuring 2-3 cm x 4 cm.  There is  no discreet mass, though there is fullness in this area that corresponds to  point of tenderness.  There is no organomegaly.  EXTREMITIES:  Full range of motion.  No cyanosis, clubbing or edema.  RECTAL:  Deferred.   IMPRESSION:  Persistent upper abdominal pain with tenderness on exam.  This  could be due to active peptic disease.  Fullness and tenderness raises the  question of a neoplasm in the stomach.   RECOMMENDATION:  1. Begin Protonix 40 mg daily.  2. Upper endoscopy.  If not diagnostic, I will obtain a CT of the abdomen.  3. Check CMET, CBC, and amylase.                                   [name]. Deatra Ina, MD, [facility]   RDK/MedQ  DD:  03/28/2006  DT:  03/28/2006  Job #:  [id number]   cc:   Darcus Austin, DO

## 2010-12-22 NOTE — H&P (Signed)
[place]  Patient:    [name], [name]                     MRN: [id number] Adm. Date:  [id number] Attending:  Minda Meo                         History and Physical  HISTORY:  Ms. Pyles is a lady who came to see me on September 23, 2000, complaining of back pain with radiation down to the left leg for two months. The pain goes posterolateral all the way down to the left foot.  She also has some right leg pain, but this left leg pain accounts for 95% of the pain that she is complaining about.  She has had conservative treatment.  She is not any better.  She was sent to [place] after she had an MRI.  PAST MEDICAL HISTORY:  Several _______ in the past, but according to her, she does not remember which one.  ALLERGIES:  BENADRYL, ERYTHROMYCIN, AND VICODIN.  CURRENT MEDICATIONS:  The patient told me that she is taking some medications, but she forgot which ones.  On reviewing the [facility] referral, she is taking Lipitor and Celexa.  SOCIAL HISTORY:  The patient smokes one pack a day.  She does not drink.  She is 5 feet 2 inches tall, and weighs 155 pounds.  REVIEW OF SYSTEMS:  Positive for a sinus headache, high cholesterol, chronic cough, shortness of breath, bronchitis, liver disease, and weakness of the left arm and the left leg.  PHYSICAL EXAMINATION:  GENERAL:  The patient came to my office with her mother.  The patient is age 65.  She was really quiet, because the pain was killing her.  HEENT:  Normal.  NECK:  Normal.  LUNGS:  Some rhonchi bilaterally.  CARDIOVASCULAR:  Normal.  ABDOMEN:  Normal.  EXTREMITIES:  Normal.  NEUROLOGIC:  Mental status and cranial nerves normal.  Strength is 5/5 except in the left leg, where it is found that she has weakness in the quadriceps and dorsiflexion of the left foot.  There is no evidence of fasciculations. Sensation seems to be normal.  She complains of numbness  mostly following the L4 and L5 nerve roots.  Reflexes symmetrical with decrease of the left ankle jerk.  Straight leg raising on the right side is positive at 80 degrees, the left side is positive at 60 degrees.  Coordination is normal.  EXTREMITIES:  In the lower extremities she has some skin changes secondary to venous ectasia.  She has some phlebitis.  The MRI shows indeed she has a prominent herniated disk at the level of L4-5, going to the left side, effecting the L4 nerve root.  She has a small herniated disk on the right side, which is clinically insignificant.  IMPRESSION: 1. Left L4-L5 herniated disk. 2. Phlebitis of both legs.  RECOMMENDATIONS:  The patient is being admitted for surgery.  She knows about the risks such as infection, CSF leak, worsening of the pain, paralysis, phlebitis, deep vein thrombosis, and pneumonia.  The patient declined another opinion. DD:  10/01/00 TD:  10/01/00 Job: [id number]

## 2010-12-22 NOTE — Op Note (Signed)
[name], ELDEN NO.:  [id number]   MEDICAL RECORD NO.:  [id number]          PATIENT TYPE:  OIB   LOCATION:  3006                         FACILITY:  [facility]   PHYSICIAN:  Faythe Ghee, M.D. DATE OF BIRTH:  04-20-1946   DATE OF PROCEDURE:  04/26/2006  DATE OF DISCHARGE:  04/27/2006                                 OPERATIVE REPORT   PREOPERATIVE DIAGNOSIS:  Failed back syndrome.   POSTOPERATIVE DIAGNOSIS:  Failed back syndrome.   PROCEDURE:  Permanent spinal stimulator implant with implantation of spinal  stimulator battery pack.   SURGEON:  Dr. Hal Neer.   PROCEDURE IN DETAIL:  After being placed in the prone station, the patient's  thoracic spine, left lower back and buttock were prepped and draped in the  usual sterile fashion.  Localizing fluoroscopy was used prior to incision to  identify the T10-T9 region.  An incision was made over the spinous process  of T10 and T11.  Using bovie cutting current, the incision was carried down  the spinous processes.  Subperiosteal dissection was then carried out along  the left side of the spinous processes and laminas and self retaining  retractor was placed for exposure.  Fluoroscopy showed approach to a good  apposition and a left hemi laminectomy of T11 was performed.  A trial  attempt at sliding the lead was carried out and the lead was found to go  into good position centered above T9 and T10 without difficulty.  A small  incision was then made in the left buttock and a pocket formed for the  battery pack.  At this time, the passer was passed from the buttock towards  the thoracic region.  Because of the patient's somewhat hyperlordotic upper  lumbar spine, it was hard to pass it a single pass and therefore a small  intermediate incision was made and the pass made in two passes.  The lead  was then passed down without difficulty.  The contact paddle was then slit  up into excellent position central and  slightly left of midline, centered  above the lower T9 region across the T9-10 disk and over the T10 vertebra.  The wire was then secured to the battery and the battery placed in the  battery pocket without difficulty.  Irrigation was carried out on both  incisions and Gelfoam was placed over the exposed dura. Both wounds were  then closed in multiple layers of Vicryl on the fascia, subcutaneous and  subcuticular tissues and staples were placed on the skin.  Three staples  were placed on the small intermediate incision.  Sterile dressings were then  applied and the patient was extubated and taken to the recovery room in  stable condition.           ______________________________  Faythe Ghee, M.D.     ROK/MEDQ  D:  04/26/2006  T:  04/29/2006  Job:  [id number]

## 2010-12-22 NOTE — Op Note (Signed)
NAME:  [name], [name]                        ACCOUNT NO.:  [id number]   MEDICAL RECORD NO.:  [id number]                   PATIENT TYPE:  AMB   LOCATION:  DAY                                  FACILITY:  [facility]   PHYSICIAN:  Domingo Pulse, M.D.               DATE OF BIRTH:  10-Feb-1946   DATE OF PROCEDURE:  05/26/2002  DATE OF DISCHARGE:                                 OPERATIVE REPORT   PREOPERATIVE DIAGNOSIS:  Retained stent, status post Acucise endopyelotomy.   POSTOPERATIVE DIAGNOSIS:  Retained stent, status post Acucise endopyelotomy.   PROCEDURES:  1. Cystoscopy.  2. Removal of double-J.  3. Right retrograde.   SURGEON:  Domingo Pulse, M.D.   ANESTHESIA:  General.   COMPLICATIONS:  None.   DRAINS:  None.   BRIEF HISTORY:  This 65 year old female had right flank pain and evaluation  with imaging studies demonstrated the presence of a partial UPJ obstruction.  The patient was given all the options for therapy and elected to undergo an  Acucise endopyelotomy with the thought that this was the least invasive  option for managing her obstruction.  She was made aware of the fact that  she could simply observe the UPJ obstruction and could also consider  percutaneous endopyelotomy or a formal pyeloplasty.  The patient has had an  endopyelotomy stent in place, which is far too large to remove in the  office.  She is now to undergo removal of stent.  A retrograde study to  determine the patency of the ureter.  She gave full and informed consent.   PROCEDURE:  After successful induction of general anesthesia, the patient  was placed in the dorsal lithotomy position, prepped with Betadine, and  draped in the usual sterile fashion.  Cystoscopy was performed and the  bladder was carefully inspected.  It was free of any tumor or stones.  Both  of the orifices were normal.  In its appearance, the right ureteral orifice  was edematous secondary to the indwelling stent.  The  stent was grasped and  partially removed.  The A-wire was passed up through the stent as a safety  wire incase there was a gross extravasation of urine requiring restenting.  A retrograde study was performed.  This showed a dependent UPJ with no  angulation.  It appeared to be at the very bottom of the pelvis.  The area  was carefully monitored for approximately 10 minutes and some Lasix was  administered.  Contrast did come through into the ureter but complete  drainage was not obtained within 10 minutes.  Decision was made not to  replace the stent but simply to observe the patient with the possibility  that is she does have recurrent pain on that side, that a stent might need  to be placed on an outpatient basis.  The bladder was  drained.  The patient tolerated the procedure well  and was taken to the  recovery room in good condition.  She has Darvocet and Pyridium for  symptomatic management and will be maintained on Levaquin for three days.  She will return to the office in follow up and will have a follow up renal  scan to determine if her drain out has improved.                                               Domingo Pulse, M.D.    RJE/MEDQ  D:  05/26/2002  T:  05/26/2002  Job:  [id number]   cc:   Darcus Austin, D.O.

## 2010-12-22 NOTE — Assessment & Plan Note (Signed)
[facility]                           GASTROENTEROLOGY OFFICE NOTE   NAME:[name], [name]                     MRN:          [id number]  DATE:04/22/2006                            DOB:          04/26/1946    PROBLEM:  Abdominal pain.   [name] has returned following upper endoscopy and CT scan.  Endoscopy  demonstrated grade B erosive esophagitis.  On her initial exam, it was noted  that she had a fullness in the abdominal wall, raising the question of a  mass.  She underwent a CT scan that demonstrated subtle nodularity of the  liver compatible with cirrhosis.  There was also early variceal formation  seen at the GE junction.  It is noteworthy that no varices were seen at  endoscopy.  Lab was pertinent for alkaline phosphatase of 137 and an AST of  53.  Albumin was 3.4.  [name] has no history of liver disease, though  family history is notable for liver disease in her mother.  She does not use  alcohol.   PHYSICAL EXAMINATION:  VITAL SIGNS:  Pulse 88, blood pressure 130/66.  Weight 162.   IMPRESSION:  1. Right upper quadrant pain.  This may be due to mild hepatomegaly.  2. Cryptogenic cirrhosis.  Etiologies for chronic hepatitis need to be      ruled out including viral hepatitis, hemochromatosis, primary biliary      cirrhosis.  3. Esophagitis.   RECOMMENDATION:  1. Begin omeprazole 20 mg a day.  2. Check serologies for hepatitis B and C, AMA, ANA and anti-smooth muscle      antibody, iron, TIBC and ferritin levels.                                   [name]. Deatra Ina, MD,FACG   RDK/MedQ  DD:  04/22/2006  DT:  04/23/2006  Job #:  [id number]   cc:   Darcus Austin, D.O.
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
## 2010-12-22 NOTE — Cardiovascular Report (Signed)
[place]  Patient:    [name], [name] Visit Number: [id number] MRN: [id number]          Service Type: Attending:  Chase Picket, M.D. Dictated by:   Chase Picket, M.D. Proc. Date: 04/09/01                          Cardiac Catheterization  PROCEDURES: 1. Left heart catheterization. 2. Selective right and left coronary arteriography. 3. Ventriculography in the RAO projection. 4. Aortic root injection.  CARDIOLOGIST:  Jeanella Craze. Little, M.D.  COMPLICATIONS:  None.  EQUIPMENT USED:  6-French Judkins configuration catheters plus a no-toe right coronary catheter.  INDICATIONS FOR TEST:  This is a 65 year old female who has multiple risk factors who presented with anginal-like complaints and is brought in for outpatient cardiac catheterization.  She does have a history of DVT, and prior to her evaluation, she had venous Doppler studies that were unremarkable.  DESCRIPTION OF PROCEDURE:  The patient was prepped and draped in the usual sterile fashion exposing the right groin, applying local anesthetic with 1% Xylocaine.  A Seldinger technique was employed and 6-French introducer sheath placed in the right femoral artery.  Selective right and left coronary arteriography and ventriculography in the RAO projection was performed.  RESULTS:  I.   HEMODYNAMIC MONITORING:  Central aortic pressure 139/74, left ventricular      pressure 142/18 with no aortic gradient noted at the time of pullback.  II.  AORTIC ROOT INJECTION:  The aortic root appeared to be normal in contour      and was not dilated.  There was no evidence of aortic insufficiency, and      it was apparent with this injection that the right coronary artery was a      small and nondominant vessel.  III. CORONARY ARTERIOGRAPHY:  No calcification was noted on fluoroscopy.      1. This was a left dominant system with a short left main.      2. LAD: The LAD crossed the apex of the heart  and gave rise to one         large first diagonal branch.  This system was free of disease.      3. Circumflex: The circumflex stayed around the A-V groove and gave         rise to one very large OM vessel.  The other OM vessels and the PDA         were very small but clearly supplied the inferior wall, and a very         small PDA vessel was noted.  This system was free of disease.      4. Right coronary artery: The right coronary artery was very small,         giving rise to two small RV branches.  A no-toe catheter was used         to make sure that we had in fact engaged the true right coronary         artery, and even aortic root injection was performed to make sure         there was not a larger RCA that had a different ostium.  There was         clearly no other right circulation other than through the circumflex.  CONCLUSION: 1. Left-dominant system with no coronary disease. 2. Normal left ventricular systolic function.  At this point,  there is nothing to suggest her chest pain is cardiac in origin.  She will be discharged today with followup in my office tomorrow. Dictated by:   Chase Picket, M.D. Attending:  Chase Picket, M.D. DD:  04/09/01 TD:  04/09/01 Job: [id number]

## 2010-12-22 NOTE — Discharge Summary (Signed)
NAMECHANELL, [name]:  [id number]   MEDICAL RECORD NO.:  [id number]          PATIENT TYPE:  INP   LOCATION:  1605                         FACILITY:  [facility]   PHYSICIAN:  Gaynelle Arabian, M.D.    DATE OF BIRTH:  12-24-1945   DATE OF ADMISSION:  06/28/2008  DATE OF DISCHARGE:  07/01/2008                               DISCHARGE SUMMARY   ADMITTING DIAGNOSES:  1. Acute vascular necrosis (AVN), right hip with femoral head      collapse.  2. Hypertension.  3. Asthma.  4. Chronic anemia.  5. History of renal calculi.  6. History of enlarged liver.  7. Past history of bleeding ulcers.  8. Preop thrombocytopenia, stable.   DISCHARGE DIAGNOSES:  1. Acute vascular necrosis, right hip, status post right total hip      replacement arthroplasty.  2. Postop blood loss anemia.  Did not require transfusion.  3. Hypokalemia.  Preop improved.  4. History of renal calculi.  5. History of enlarged liver.  6. Past history of bleeding ulcers.   PROCEDURE:  June 28, 2008, right total hip.  Surgeon: Dr. Wynelle Link.  Assistant: Arlee Muslim PA-C.  Anesthesia: General.   CONSULTS:  None.   BRIEF HISTORY:  [name] is a 89-year female with osteonecrosis/AVN of  the right hip with intractable pain,  failed nonoperative management.  Now presents for a total hip arthroplasty.  She has had a successful  left hip, now presenting for the right hip.   LABORATORY DATA:  Preop CBC showed hemoglobin 10.9, hematocrit 33.6,  white cell count 4.5, platelets low at 106.  Postop hemoglobin down to  10.8, drift down to 9.2.  Last H&H was 8.0 and 23.3.  PT/PTT preop 13  with 23, respectively.  INR 1.0.  Serial protimes as followed.  Last  known PT/INR 24.0 and 2.0.  Chem panel on admission:  Low albumin at  3.3, slightly elevated AST of 47, slightly elevated ALP of 194.  Low  potassium at 3.3.  Remaining Chem panel within normal limits.  Serial  basic metabolic panel were as follows:   Sodium remained normal.  Potassium came up to 4.0.  Preop UA:  Small leukocyte esterase, few  epithelials, only 36 white cell, few bacteria, otherwise negative.  Blood group type B+.   X-rays:  Hip film June 28, 2008, satisfactory appearance right total  hip.  Portable pelvis:  June 28, 2008, satisfactory appearance right  total hip prosthesis.   HOSPITAL COURSE:  Patient was admitted to [facility],  tolerated procedure well, and later transferred from Recovery Room to  the Orthopedic floor.  Started on PCA and p.o. analgesic for pain  control following surgery.  Given 24 hours of postop IV antibiotics.  Had low potassium preoperatively.  Started on K-Dur.  Started back on  home meds.  Had a prior history of a DVT, so she was placed on Lovenox  bridge.  Actually doing pretty well postop except discomfort on movement  following the hip procedure.  She started getting up and out of bed on  day 1 and  by day 2, patient was a doing a little bit better, better pain  control, although the hemoglobin was down a little bit lower to 8.6.  We  did place her on iron supplement, no blood was needed at time.  She was  clinically stable.  Potassium had to come back up.  She was progressing  well with Physical Therapy and walking about 40 feet that morning, 80  feet that afternoon.  By day 3, the hemoglobin was a little bit lower at  8.0, but she had no symptoms.  She was is progressing well with Physical  Therapy, meeting her goals, and wanted to go home.   DISCHARGE/PLAN:  1. Patient discharged home July 01, 2008.  2. Discharge diagnoses, please see above.  3. Discharge meds: Percocet, Valium, Nu-Iron Coumadin.  4. Follow-up 2 weeks.   ACTIVITY:  Partial weightbearing right lower extremity.  Home health PT,  home nursing.   DISPOSITION:  Home.   CONDITION UPON DISCHARGE:  Improving.      Alexzandrew L. Perkins, P.A.C.      Gaynelle Arabian, M.D.  Electronically  Signed    ALP/MEDQ  D:  08/19/2008  T:  08/19/2008  Job:  [id number]   cc:   Gaynelle Arabian, M.D.  Fax: [phone]   Darcus Austin, D.O.  Fax: [phone]

## 2010-12-22 NOTE — Op Note (Signed)
[place]  Patient:    [name], [name]                     MRN: [id number] Proc. Date: 10/01/00 Adm. Date:  [id number] Attending:  Minda Meo                           Operative Report  PREOPERATIVE DIAGNOSES:  Left L4-5 lateral foraminal herniated disk, hypertrophy of the facet.  POSTOPERATIVE DIAGNOSES:  Left L4, L5 hypertrophy of the facet with foraminal stenosis, conjoined nerve roots.  PROCEDURE:  Left L4-5 laminotomy, decompression of the L4 and L5 nerve root. Microscope.  SURGEON:  Zigmund Daniel. Joya Salm, M.D.  ASSISTANT:  Hosie Spangle, M.D.  CLINICAL HISTORY:  Ms. Pfahler is a 65 year old female who was seen by me because of pain in the left leg with weakness of the quadriceps, iliopsoas, and some mild weakness on dorsiflexion.  The patient had had conservative treatment without any improvement.  The patient had an MRI, which was read by the radiologist as hypertrophy of the facet with left L4-5 lateral herniated disk.  Because of the pain, patient wanted to go ahead with surgery.  The patient knew of the risks, such as infection, CSF leak, worsening of the pain, paralysis, need for further surgery.  DESCRIPTION OF PROCEDURE:  The patient was taken to the OR and after intubation was placed in a prone manner.  A midline incision between L4 and L5 was made.  Muscle and fascia were retracted laterally.  X-ray showed that indeed we were at the level of 4-5.  We brought the microscope into the area and with the drill we started doing a laminotomy with removal of the lower lamina of L4 and the upper of L5.  We found the dural sac.  Indeed, the patient has quite a bit of hypertrophy of the facet.  Immediately we found that the L5 nerve root was almost impossible to mobilize, and it was quite adherent to the canal.  Lysis was done.  Then we started working our way up, trying to localize the shoulder aspect of the L5 nerve root  to see if we can find out the disk.  We found in that area was that there was no disk right there, but there was the L4 nerve root, which was coming from the L5.  This was a typical case of conjoined nerve root.  The nerve was swollen and reddish.  We did foraminotomy, and we were able to decompress the L4 and L5 nerve root.  We did another x-ray just to be 100% sure, and indeed that was at the level of 4-5.  Because of the findings, we sent the MRI to the x-ray department.  Dr. Azzie Roup from the x-ray department agreed with [place] that this was a conjoined nerve root, and no herniated disk initially was read.  _____ we have a good decompression.  There was no evidence of any herniated disk and no fragment.  From then on, the area was irrigated.  Fentanyl and Depo-Medrol were left in the epidural space, and the wound was closed with Vicryl and a Steri-Strip.  The patient did well. DD:  10/01/00 TD:  10/02/00 Job: [id number]

## 2010-12-22 NOTE — H&P (Signed)
NAME:  [name], [name]                        ACCOUNT NO.:  [id number]   MEDICAL RECORD NO.:  [id number]                   PATIENT TYPE:  INP   LOCATION:  0460                                 FACILITY:  [facility]   PHYSICIAN:  Gaynelle Arabian, M.D.                 DATE OF BIRTH:  1946/07/11   DATE OF ADMISSION:  05/03/2003  DATE OF DISCHARGE:  05/07/2003                                HISTORY & PHYSICAL   CHIEF COMPLAINT:  Left hip pain.   HISTORY OF PRESENT ILLNESS:  The patient is a 65 year old female who has  been seen by Dr. Wynelle Link for ongoing left hip pain.  Her left hip has  progressively getting worse over the past several months.  She does have  bilateral hip pain; however, the left hip is more symptomatic than the  right.  She does have pain in the groin which travels down the leg.  She  even has pain at night.  She has been seen by Dr. Joya Salm for her back and  has had an MRI which demonstrates there is some epidural scarring on the  left at L4-5.  Given the significant pain she was having, MRI of the pelvis  was ordered which showed significant avascular necrosis of the hip, the left  moreso than the right.  She does have significant bone-marrow edema in the  femoral head on the left.  She does have pain that does extend down into the  leg and foot at times.  There is a portion of her pain which is radicular  from the epidural scarring.  However, noted  significant changes on MRI and  also the pain which is mechanical.  It is felt a lot of her pain is coming  from her left hip.  It is felt the most predictable means of improving her  pain and function would be a total hip arthroplasty given that the majority  of the head is involved with avascular necrosis process.  The risks and  benefits of this procedure have been discussed with the patient, and she has  elected to proceed with surgery.   ALLERGIES:  ERYTHROMYCIN.  VICODIN.  BENADRYL.  NIACIN.  LOPID.  FLUZONE.  ATIVAN.   CLARITIN.   CURRENT MEDICATIONS:  Zocor.  Neurontin.  Evista.  Ultracet.  Hydrochlorothiazide.  Prozac.   PAST MEDICAL HISTORY:  1. History of deep vein thrombosis x2, in 1979 and 1996.  2. History of elevated liver function enzymes.  3. Right kidney arterial blockage.  4. Surgical menopause.  5. History of bronchitis.  6. Hypercholesterolemia.  7. Osteoporosis.  8. Back pain.  9. Depression.  10.      History of asthma.   PAST SURGICAL HISTORY:  1. Breast biopsy.  2. Left vein stripping.  3. Back surgery.  4. Right shoulder surgery.  5. Left shoulder surgery.  6. Hysterectomy in 1974.  7. Renal stent.  8. Bilateral oophorectomy.   SOCIAL HISTORY:  Married.  Three sons.  A two-pack-per-day smoker off and on  since age 81.  Very seldom intake of alcohol.   FAMILY HISTORY:  Father deceased around 59, history of stroke and lung  cancer.  Mother, age 30, back surgery and a blood disease.   REVIEW OF SYSTEMS:  GENERAL:  She does have some occasional night sweats.  No fevers or chills.  NEUROLOGIC:  She does have some epidural scarring on  the left at L4-5.  No seizures, syncope, or paralysis.  RESPIRATORY:  No  shortness of breath, productive cough, or hemoptysis.  CARDIOVASCULAR:  No  chest pain, angina, or orthopnea.  GASTROINTESTINAL:  No nausea, vomiting,  diarrhea, or constipation.  GENITOURINARY:  No dysuria, hematuria, or  discharge.  She does have a right kidney arterial blockage which has been  stented.  MUSCULOSKELETAL:  Pertinent to the left hip found in the history  of present illness.   PHYSICAL EXAMINATION:  VITAL SIGNS:  Pulse 54, respirations 14, blood  pressure 132/78.  GENERAL:  The patient is a 65 year old white female, well-nourished, well-  developed, short in stature.  In no acute distress.  She is alert, oriented,  and cooperative.  HEENT:  Normocephalic, atraumatic.  Pupils round and reactive.  EOMs are  intact.  Oropharynx is clear.  She does have  full upper and partial lower  dentures.  NECK:  Supple.  CHEST:  Clear to auscultation anterior and posterior chest walls.  No  rhonchi, rales, or wheezing is appreciated.  HEART:  Regular rate and rhythm.  Slight bradycardic rhythm with a rate of  54.  There are no murmurs.  ABDOMEN:  Soft, slightly round abdomen.  Bowel sounds are present.  No  rebound or guarding.  RECTAL, BREASTS, GENITALIA:  Not done.  Not pertinent to present illness.  EXTREMITIES:  Limited to that of the left lower extremity.  She does have  painful range of motion in and about the left hip.  Only flexes up to about  90 degrees.  Internal and external rotation of 20 and 30 respectively.  Abduction of only about 30 degrees.  She does have some edema noted in the  lower extremity.   IMPRESSION:  1. Avascular necrosis left hip.  2. Hypercholesterolemia.  3. Osteoporosis.  4. Back pain.  5. Depression.  6. Asthma.  7. History of bronchitis.  8. Surgical menopause.  9. History of elevated liver enzymes.  10.      History of deep vein thrombosis x2.   PLAN:  The patient will be admitted to [facility] to undergo a  left total hip arthroplasty.  Surgery will be performed by Dr. Gaynelle Arabian.     Alexzandrew L. Dara Lords, P.A.              Gaynelle Arabian, M.D.    ALP/MEDQ  D:  05/09/2003  T:  05/09/2003  Job:  [id number]

## 2011-03-05 ENCOUNTER — Other Ambulatory Visit (HOSPITAL_COMMUNITY): Payer: Self-pay | Admitting: Internal Medicine

## 2011-03-05 DIAGNOSIS — Z1231 Encounter for screening mammogram for malignant neoplasm of breast: Secondary | ICD-10-CM

## 2011-03-14 ENCOUNTER — Ambulatory Visit (HOSPITAL_COMMUNITY)
Admission: RE | Admit: 2011-03-14 | Discharge: 2011-03-14 | Disposition: A | Discharge status: Home / Self Care | Payer: 59 | Source: Ambulatory Visit | Attending: Internal Medicine | Admitting: Internal Medicine

## 2011-03-14 DIAGNOSIS — Z1231 Encounter for screening mammogram for malignant neoplasm of breast: Secondary | ICD-10-CM | POA: Insufficient documentation

## 2011-05-07 LAB — URINALYSIS, ROUTINE W REFLEX MICROSCOPIC
Bilirubin Urine: NEGATIVE
Glucose, UA: NEGATIVE
Specific Gravity, Urine: 1.008
pH: 6.5

## 2011-05-07 LAB — COMPREHENSIVE METABOLIC PANEL
ALT: 20
AST: 44 — ABNORMAL HIGH
Albumin: 3.6
Alkaline Phosphatase: 175 — ABNORMAL HIGH
BUN: 8
Chloride: 96
Potassium: 2.9 — ABNORMAL LOW
Sodium: 137
Total Bilirubin: 1.2
Total Protein: 6.5

## 2011-05-07 LAB — URINE MICROSCOPIC-ADD ON

## 2011-05-07 LAB — CBC
HCT: 34.9 — ABNORMAL LOW
Platelets: 107 — ABNORMAL LOW
WBC: 5.1

## 2011-05-07 LAB — ABO/RH: ABO/RH(D): B POS

## 2011-05-07 LAB — TYPE AND SCREEN: Antibody Screen: NEGATIVE

## 2011-05-08 LAB — CBC
HCT: 23.3 — ABNORMAL LOW
HCT: 26.4 — ABNORMAL LOW
HCT: 28.4 — ABNORMAL LOW
HCT: 33 — ABNORMAL LOW
HCT: 33.6 — ABNORMAL LOW
Hemoglobin: 10.8 — ABNORMAL LOW
Hemoglobin: 8 — ABNORMAL LOW
Hemoglobin: 8.6 — ABNORMAL LOW
Hemoglobin: 9.2 — ABNORMAL LOW
MCHC: 32.3
MCHC: 32.6
MCV: 78.9
MCV: 79.2
MCV: 80
MCV: 80.8
Platelets: 106 — ABNORMAL LOW
Platelets: 111 — ABNORMAL LOW
Platelets: 92 — ABNORMAL LOW
RBC: 3.26 — ABNORMAL LOW
RBC: 3.55 — ABNORMAL LOW
RDW: 20.2 — ABNORMAL HIGH
RDW: 20.6 — ABNORMAL HIGH
RDW: 21 — ABNORMAL HIGH
WBC: 4.5
WBC: 7.5

## 2011-05-08 LAB — COMPREHENSIVE METABOLIC PANEL
AST: 47 — ABNORMAL HIGH
Albumin: 3.3 — ABNORMAL LOW
BUN: 9
CO2: 31
Calcium: 9.2
Chloride: 103
Creatinine, Ser: 0.72
GFR calc Af Amer: >60
GFR calc non Af Amer: >60
Total Bilirubin: 0.9

## 2011-05-08 LAB — BASIC METABOLIC PANEL
BUN: 8
CO2: 31
Chloride: 101
Chloride: 105
Glucose, Bld: 128 — ABNORMAL HIGH
Potassium: 3.4 — ABNORMAL LOW
Potassium: 4
Sodium: 137
Sodium: 142

## 2011-05-08 LAB — PROTIME-INR: INR: 1

## 2011-05-08 LAB — URINALYSIS, ROUTINE W REFLEX MICROSCOPIC
Bilirubin Urine: NEGATIVE
Ketones, ur: NEGATIVE
Nitrite: NEGATIVE
Urobilinogen, UA: 0.2
pH: 7.5

## 2011-05-08 LAB — APTT: aPTT: 23 — ABNORMAL LOW

## 2011-05-08 LAB — TYPE AND SCREEN: ABO/RH(D): B POS

## 2011-05-08 LAB — URINE MICROSCOPIC-ADD ON

## 2011-05-09 LAB — DIFFERENTIAL
Basophils Relative: 1
Eosinophils Absolute: 0.1
Lymphocytes Relative: 28
Lymphs Abs: 1.1
Neutro Abs: 2.5

## 2011-05-09 LAB — COMPREHENSIVE METABOLIC PANEL
AST: 34
AST: 47 — ABNORMAL HIGH
Albumin: 2.6 — ABNORMAL LOW
BUN: 3 — ABNORMAL LOW
CO2: 28
Calcium: 8.3 — ABNORMAL LOW
Chloride: 103
Chloride: 104
Creatinine, Ser: 0.54
Creatinine, Ser: 0.6
GFR calc Af Amer: >60
GFR calc Af Amer: >60
GFR calc non Af Amer: >60
Total Bilirubin: 0.9
Total Bilirubin: 1
Total Protein: 4.9 — ABNORMAL LOW

## 2011-05-09 LAB — BASIC METABOLIC PANEL
BUN: 4 — ABNORMAL LOW
CO2: 31
CO2: 31
Calcium: 8.3 — ABNORMAL LOW
Calcium: 8.4
Chloride: 106
Chloride: 106
Creatinine, Ser: 0.39 — ABNORMAL LOW
Creatinine, Ser: 0.54
GFR calc Af Amer: >60
GFR calc Af Amer: >60
GFR calc non Af Amer: >60
Sodium: 141

## 2011-05-09 LAB — ABO/RH: ABO/RH(D): B POS

## 2011-05-09 LAB — CBC
HCT: 32.7 — ABNORMAL LOW
MCHC: 32.4
MCV: 80.3
MCV: 80.3
Platelets: 59 — ABNORMAL LOW
Platelets: 69 — ABNORMAL LOW
RDW: 16 — ABNORMAL HIGH
WBC: 4.2

## 2011-05-09 LAB — CROSSMATCH: ABO/RH(D): B POS

## 2011-05-09 LAB — PROTIME-INR: INR: 1

## 2011-05-09 LAB — URINALYSIS, ROUTINE W REFLEX MICROSCOPIC
Glucose, UA: NEGATIVE
Hgb urine dipstick: NEGATIVE
Specific Gravity, Urine: 1.008
pH: 6.5

## 2011-06-06 ENCOUNTER — Other Ambulatory Visit: Payer: Self-pay | Admitting: Hematology and Oncology

## 2011-06-06 ENCOUNTER — Encounter (HOSPITAL_BASED_OUTPATIENT_CLINIC_OR_DEPARTMENT_OTHER): Payer: 59 | Admitting: Hematology and Oncology

## 2011-06-06 DIAGNOSIS — D696 Thrombocytopenia, unspecified: Secondary | ICD-10-CM

## 2011-06-06 DIAGNOSIS — K766 Portal hypertension: Secondary | ICD-10-CM

## 2011-06-06 DIAGNOSIS — D6959 Other secondary thrombocytopenia: Secondary | ICD-10-CM

## 2011-06-06 LAB — COMPREHENSIVE METABOLIC PANEL
AST: 29 U/L (ref 0–37)
Albumin: 3.8 g/dL (ref 3.5–5.2)
Alkaline Phosphatase: 133 U/L — ABNORMAL HIGH (ref 39–117)
Potassium: 4 mEq/L (ref 3.5–5.3)
Sodium: 141 mEq/L (ref 135–145)
Total Protein: 5.6 g/dL — ABNORMAL LOW (ref 6.0–8.3)

## 2011-06-06 LAB — CBC WITH DIFFERENTIAL/PLATELET
EOS%: 2.1 % (ref 0.0–7.0)
Eosinophils Absolute: 0.1 10*3/uL (ref 0.0–0.5)
MCH: 31.5 pg (ref 25.1–34.0)
MCV: 93.5 fL (ref 79.5–101.0)
MONO%: 5.2 % (ref 0.0–14.0)
NEUT#: 2.6 10*3/uL (ref 1.5–6.5)
RBC: 4.31 10*6/uL (ref 3.70–5.45)
RDW: 13.8 % (ref 11.2–14.5)
lymph#: 1.2 10*3/uL (ref 0.9–3.3)

## 2011-08-10 ENCOUNTER — Other Ambulatory Visit: Payer: Self-pay | Admitting: Gastroenterology

## 2011-08-20 ENCOUNTER — Other Ambulatory Visit: Payer: Self-pay | Admitting: Internal Medicine

## 2011-08-20 DIAGNOSIS — R221 Localized swelling, mass and lump, neck: Secondary | ICD-10-CM

## 2011-08-21 ENCOUNTER — Other Ambulatory Visit: Payer: Self-pay | Admitting: Sports Medicine

## 2011-08-21 ENCOUNTER — Ambulatory Visit
Admission: RE | Admit: 2011-08-21 | Discharge: 2011-08-21 | Disposition: A | Discharge status: Home / Self Care | Payer: 59 | Source: Ambulatory Visit | Attending: Internal Medicine | Admitting: Internal Medicine

## 2011-08-21 DIAGNOSIS — R221 Localized swelling, mass and lump, neck: Secondary | ICD-10-CM

## 2011-08-21 DIAGNOSIS — M25511 Pain in right shoulder: Secondary | ICD-10-CM

## 2011-08-27 ENCOUNTER — Encounter (HOSPITAL_COMMUNITY)
Admission: RE | Admit: 2011-08-27 | Discharge: 2011-08-27 | Disposition: A | Discharge status: Home / Self Care | Payer: 59 | Source: Ambulatory Visit | Attending: Sports Medicine | Admitting: Sports Medicine

## 2011-08-27 ENCOUNTER — Encounter (HOSPITAL_COMMUNITY): Payer: Self-pay

## 2011-08-27 DIAGNOSIS — M25519 Pain in unspecified shoulder: Secondary | ICD-10-CM | POA: Insufficient documentation

## 2011-08-27 DIAGNOSIS — M25511 Pain in right shoulder: Secondary | ICD-10-CM

## 2011-08-27 DIAGNOSIS — Z96619 Presence of unspecified artificial shoulder joint: Secondary | ICD-10-CM | POA: Insufficient documentation

## 2011-08-27 MED ORDER — TECHNETIUM TC 99M MEDRONATE IV KIT
25.0000 | PACK | Freq: Once | INTRAVENOUS | Status: AC | PRN
  Administered 2011-08-27: 25 via INTRAVENOUS

## 2011-10-30 ENCOUNTER — Telehealth: Payer: Self-pay | Admitting: *Deleted

## 2011-11-13 NOTE — Telephone Encounter (Signed)
Letter mailed to patent today,,,Past due for her procedure, this was due in March

## 2011-11-21 ENCOUNTER — Ambulatory Visit (HOSPITAL_COMMUNITY)
Admission: RE | Admit: 2011-11-21 | Discharge: 2011-11-21 | Disposition: A | Discharge status: Home / Self Care | Payer: Medicare Other | Source: Ambulatory Visit | Attending: Internal Medicine | Admitting: Internal Medicine

## 2011-11-21 ENCOUNTER — Other Ambulatory Visit (HOSPITAL_COMMUNITY): Payer: Self-pay | Admitting: Internal Medicine

## 2011-11-21 DIAGNOSIS — R05 Cough: Secondary | ICD-10-CM

## 2011-11-21 DIAGNOSIS — R509 Fever, unspecified: Secondary | ICD-10-CM | POA: Insufficient documentation

## 2011-11-21 DIAGNOSIS — R062 Wheezing: Secondary | ICD-10-CM | POA: Insufficient documentation

## 2011-11-21 DIAGNOSIS — R0602 Shortness of breath: Secondary | ICD-10-CM | POA: Insufficient documentation

## 2011-11-21 DIAGNOSIS — J984 Other disorders of lung: Secondary | ICD-10-CM | POA: Insufficient documentation

## 2011-11-21 DIAGNOSIS — R059 Cough, unspecified: Secondary | ICD-10-CM | POA: Insufficient documentation

## 2011-11-21 DIAGNOSIS — R079 Chest pain, unspecified: Secondary | ICD-10-CM | POA: Insufficient documentation

## 2011-12-11 ENCOUNTER — Other Ambulatory Visit: Payer: Self-pay | Admitting: Gastroenterology

## 2012-01-07 ENCOUNTER — Other Ambulatory Visit: Payer: Self-pay | Admitting: Gastroenterology

## 2012-01-07 ENCOUNTER — Telehealth: Payer: Self-pay

## 2012-01-07 DIAGNOSIS — R748 Abnormal levels of other serum enzymes: Secondary | ICD-10-CM

## 2012-01-07 NOTE — Telephone Encounter (Signed)
Message copied by Algernon Huxley on Mon Jan 07, 2012  1:29 PM ------      Message from: Erskine Emery D      Created: Thu Jan 03, 2012  9:01 AM       Recent lab work demonstrated an increasing alkaline phosphatase. Please order an alpha-fetoprotein and abdominal ultrasound.

## 2012-01-07 NOTE — Telephone Encounter (Signed)
Pt scheduled for abdominal ultrasound 01/09/12 at [facility] arrival time 0845 for a 9am appt. Pt to be NPO after midnight. Pt to have AFP drawn this week. Pt aware of appt dates and times.

## 2012-01-09 ENCOUNTER — Other Ambulatory Visit: Payer: Medicare Other

## 2012-01-09 ENCOUNTER — Ambulatory Visit (HOSPITAL_COMMUNITY)
Admission: RE | Admit: 2012-01-09 | Discharge: 2012-01-09 | Disposition: A | Discharge status: Home / Self Care | Payer: Medicare Other | Source: Ambulatory Visit | Attending: Gastroenterology | Admitting: Gastroenterology

## 2012-01-09 DIAGNOSIS — R748 Abnormal levels of other serum enzymes: Secondary | ICD-10-CM | POA: Insufficient documentation

## 2012-01-09 DIAGNOSIS — K7460 Unspecified cirrhosis of liver: Secondary | ICD-10-CM | POA: Insufficient documentation

## 2012-01-09 DIAGNOSIS — N289 Disorder of kidney and ureter, unspecified: Secondary | ICD-10-CM | POA: Insufficient documentation

## 2012-01-09 DIAGNOSIS — R109 Unspecified abdominal pain: Secondary | ICD-10-CM | POA: Insufficient documentation

## 2012-01-09 DIAGNOSIS — R161 Splenomegaly, not elsewhere classified: Secondary | ICD-10-CM | POA: Insufficient documentation

## 2012-01-25 ENCOUNTER — Ambulatory Visit (HOSPITAL_COMMUNITY)
Admission: RE | Admit: 2012-01-25 | Discharge: 2012-01-25 | Disposition: A | Discharge status: Home / Self Care | Payer: Medicare Other | Source: Ambulatory Visit | Attending: Physician Assistant | Admitting: Physician Assistant

## 2012-01-25 ENCOUNTER — Other Ambulatory Visit (HOSPITAL_COMMUNITY): Payer: Self-pay | Admitting: Physician Assistant

## 2012-01-25 DIAGNOSIS — R109 Unspecified abdominal pain: Secondary | ICD-10-CM | POA: Insufficient documentation

## 2012-01-25 DIAGNOSIS — M25559 Pain in unspecified hip: Secondary | ICD-10-CM | POA: Insufficient documentation

## 2012-01-25 DIAGNOSIS — N949 Unspecified condition associated with female genital organs and menstrual cycle: Secondary | ICD-10-CM | POA: Insufficient documentation

## 2012-01-25 DIAGNOSIS — T1490XA Injury, unspecified, initial encounter: Secondary | ICD-10-CM

## 2012-01-25 DIAGNOSIS — Z96649 Presence of unspecified artificial hip joint: Secondary | ICD-10-CM | POA: Insufficient documentation

## 2012-01-25 DIAGNOSIS — W19XXXA Unspecified fall, initial encounter: Secondary | ICD-10-CM | POA: Insufficient documentation

## 2012-02-01 ENCOUNTER — Emergency Department (HOSPITAL_COMMUNITY)
Admission: EM | Admit: 2012-02-01 | Discharge: 2012-02-01 | Disposition: A | Discharge status: Home / Self Care | Payer: Medicare Other | Attending: Emergency Medicine | Admitting: Emergency Medicine

## 2012-02-01 ENCOUNTER — Encounter (HOSPITAL_COMMUNITY): Payer: Self-pay | Admitting: Emergency Medicine

## 2012-02-01 ENCOUNTER — Emergency Department (HOSPITAL_COMMUNITY): Payer: Medicare Other

## 2012-02-01 DIAGNOSIS — E119 Type 2 diabetes mellitus without complications: Secondary | ICD-10-CM | POA: Insufficient documentation

## 2012-02-01 DIAGNOSIS — R0781 Pleurodynia: Secondary | ICD-10-CM

## 2012-02-01 DIAGNOSIS — Z79899 Other long term (current) drug therapy: Secondary | ICD-10-CM | POA: Insufficient documentation

## 2012-02-01 DIAGNOSIS — R079 Chest pain, unspecified: Secondary | ICD-10-CM | POA: Insufficient documentation

## 2012-02-01 DIAGNOSIS — Z96619 Presence of unspecified artificial shoulder joint: Secondary | ICD-10-CM | POA: Insufficient documentation

## 2012-02-01 DIAGNOSIS — Z888 Allergy status to other drugs, medicaments and biological substances status: Secondary | ICD-10-CM | POA: Insufficient documentation

## 2012-02-01 DIAGNOSIS — Z882 Allergy status to sulfonamides status: Secondary | ICD-10-CM | POA: Insufficient documentation

## 2012-02-01 DIAGNOSIS — Z9181 History of falling: Secondary | ICD-10-CM | POA: Insufficient documentation

## 2012-02-01 DIAGNOSIS — M546 Pain in thoracic spine: Secondary | ICD-10-CM | POA: Insufficient documentation

## 2012-02-01 DIAGNOSIS — Z7982 Long term (current) use of aspirin: Secondary | ICD-10-CM | POA: Insufficient documentation

## 2012-02-01 MED ORDER — HYDROMORPHONE HCL PF 1 MG/ML IJ SOLN
1.0000 mg | Freq: Once | INTRAMUSCULAR | Status: AC
  Administered 2012-02-01: 1 mg via INTRAMUSCULAR
  Filled 2012-02-01: qty 1

## 2012-02-01 MED ORDER — DIAZEPAM 5 MG/ML IJ SOLN
5.0000 mg | Freq: Once | INTRAMUSCULAR | Status: AC
  Administered 2012-02-01: 5 mg via INTRAMUSCULAR
  Filled 2012-02-01: qty 2

## 2012-02-01 MED ORDER — DIAZEPAM 5 MG PO TABS: 5.0000 mg | ORAL_TABLET | Freq: Four times a day (QID) | ORAL | Status: AC | PRN

## 2012-02-01 NOTE — ED Provider Notes (Signed)
History     CSN: [id number]  Arrival date & time 02/01/12  0132   First MD Initiated Contact with Patient 02/01/12 0344      Chief Complaint  Patient presents with  . Rib Injury    (Consider location/radiation/quality/duration/timing/severity/associated sxs/prior treatment) HPI Comments: Patient with a history of chronic back pain presents tonight with worsening pain to her right posterior rib area s/p fall from a porch onto concrete this past Thursday (1 week ago) - states that she was seen by her PCP, sent to [facility] and had x-rays which she was told were negative - she reports continued pain and "spasms in my back" feeling - she reports no fever, chills, cough or congestion - reports pain worse with movement but also mildly present with deep inspiration - denies hemoptysis as well.  Patient is a 66 y.o. female presenting with back pain. The history is provided by the patient and the spouse. No language interpreter was used.  Back Pain  This is a new problem. The current episode started more than 2 days ago. The problem occurs constantly. The problem has been gradually worsening. The pain is associated with falling. The pain is present in the thoracic spine. The quality of the pain is described as stabbing and shooting. The pain does not radiate. The pain is at a severity of 10/10. The pain is severe. The symptoms are aggravated by bending and twisting. The pain is the same all the time. Stiffness is present all day. Associated symptoms include chest pain. Pertinent negatives include no fever, no numbness, no weight loss, no headaches, no abdominal pain, no abdominal swelling, no bowel incontinence, no perianal numbness, no bladder incontinence, no dysuria, no pelvic pain, no leg pain, no paresthesias, no paresis, no tingling and no weakness. She has tried nothing for the symptoms. The treatment provided no relief.    Past Medical History  Diagnosis Date  . Hx of shoulder  replacement   . Diabetes mellitus     reports "borderline"    Past Surgical History  Procedure Date  . Hip surgery   . Shoulder surgery   . Back surgery   . Cholecystectomy   . Neck surgery     No family history on file.  History  Substance Use Topics  . Smoking status: Never Smoker   . Smokeless tobacco: Not on file  . Alcohol Use: No    OB History    Grav Para Term Preterm Abortions TAB SAB Ect Mult Living                  Review of Systems  Constitutional: Negative for fever, chills and weight loss.  HENT: Negative for neck pain.   Eyes: Negative for pain.  Respiratory: Positive for shortness of breath. Negative for chest tightness.   Cardiovascular: Positive for chest pain.  Gastrointestinal: Negative for nausea, vomiting, abdominal pain and bowel incontinence.  Genitourinary: Negative for bladder incontinence, dysuria and pelvic pain.  Musculoskeletal: Positive for back pain.  Neurological: Negative for tingling, weakness, numbness, headaches and paresthesias.  All other systems reviewed and are negative.    Allergies  Atorvastatin; Diphenhydramine hcl; Hydrocodone-acetaminophen; Loratadine-pseudoephedrine er; Lorazepam; Simvastatin; and Sulfonamide derivatives  Home Medications   Current Outpatient Rx  Name Route Sig Dispense Refill  . AMITRIPTYLINE HCL 10 MG PO TABS Oral Take 10 mg by mouth at bedtime.    . ASPIRIN 81 MG PO TABS Oral Take 81 mg by mouth daily.    Marland Kitchen  VITAMIN D 1000 UNITS PO TABS Oral Take 2,000 Units by mouth daily.    . COLESEVELAM HCL 625 MG PO TABS Oral Take 1,875 mg by mouth 2 (two) times daily with a meal.    . DULOXETINE HCL 60 MG PO CPEP Oral Take 60 mg by mouth daily.    . FENTANYL 25 MCG/HR TD PT72 Transdermal Place 1 patch onto the skin every 3 (three) days.    Lyndle Herrlich SULFATE 325 (65 FE) MG PO TABS Oral Take 325 mg by mouth daily with breakfast.    . FLAVOCOXID 500 MG PO CAPS Oral Take 500 mg by mouth.    Marland Kitchen MAGNESIUM 250 MG  PO TABS Oral Take 250 mg by mouth daily at 6 (six) AM.    . METFORMIN HCL ER 500 MG PO TB24 Oral Take 500 mg by mouth daily with breakfast.    . OMEGA-3-ACID ETHYL ESTERS 1 G PO CAPS Oral Take 2 g by mouth 2 (two) times daily.    Marland Kitchen OMEPRAZOLE 20 MG PO CPDR  TAKE 1 CAPSULE BY MOUTH 30 MINUTES BEFORE A MEAL DAILY 30 capsule 10  . OXYCODONE HCL ER 10 MG PO TB12 Oral Take 10 mg by mouth every 12 (twelve) hours.    Marland Kitchen PREDNISONE 10 MG PO TABS Oral Take 20 mg by mouth daily.    Marland Kitchen PREGABALIN 300 MG PO CAPS Oral Take 100-200 mg by mouth 2 (two) times daily. 100 mg in the morning and 200 mg at night    . RALOXIFENE HCL 60 MG PO TABS Oral Take 60 mg by mouth daily.    Marland Kitchen SPIRONOLACTONE 25 MG PO TABS Oral Take 25 mg by mouth daily.    Marland Kitchen VITAMIN E 400 UNITS PO CAPS Oral Take 400 Units by mouth daily.      BP 139/74  Pulse 82  Temp 97.3 F (36.3 C) (Oral)  Resp 16  SpO2 94%  Physical Exam  Nursing note and vitals reviewed. Constitutional: She is oriented to person, place, and time. She appears well-developed and well-nourished.       Uncomfortable appearing  HENT:  Head: Normocephalic and atraumatic.  Right Ear: External ear normal.  Left Ear: External ear normal.  Nose: Nose normal.  Mouth/Throat: Oropharynx is clear and moist. No oropharyngeal exudate.  Eyes: Conjunctivae are normal. Pupils are equal, round, and reactive to light. No scleral icterus.  Neck: Normal range of motion. Neck supple.  Cardiovascular: Normal rate and regular rhythm.  Exam reveals no gallop and no friction rub.   No murmur heard. Pulmonary/Chest: Effort normal and breath sounds normal. No respiratory distress. She has no wheezes. She has no rales.       No anterior chest tenderness  Abdominal: Soft. Bowel sounds are normal. She exhibits no distension. There is no tenderness. There is no rebound and no guarding.  Musculoskeletal:       Thoracic back: She exhibits decreased range of motion, tenderness and spasm. She  exhibits normal pulse.       Back:  Lymphadenopathy:    She has no cervical adenopathy.  Neurological: She is alert and oriented to person, place, and time. No cranial nerve deficit. She exhibits normal muscle tone. Coordination normal.  Skin: Skin is warm and dry. No rash noted. No erythema. No pallor.  Psychiatric: She has a normal mood and affect. Her behavior is normal. Judgment and thought content normal.    ED Course  Procedures (including critical care time)  Labs  Reviewed - No data to display Dg Chest 2 View  02/01/2012  *RADIOLOGY REPORT*  Clinical Data: Left-sided chest and rib pain after fall.  CHEST - 2 VIEW  Comparison: 01/25/2012  Findings: Normal heart size and pulmonary vascularity. Emphysematous changes and scattered fibrosis in the lungs.  No focal airspace consolidation.  No blunting of costophrenic angles. No pneumothorax.  Postoperative changes in the base of the neck and right shoulder.  No significant change since previous study. Degenerative changes in the thoracic spine.  IMPRESSION: Emphysematous changes and scattered fibrosis in the lungs.  No evidence of active pulmonary disease or significant interval change.  Original Report Authenticated By: Neale Burly, M.D.    Date: 02/01/2012  Rate: 75  Rhythm: normal sinus rhythm  QRS Axis: normal  Intervals: normal  ST/T Wave abnormalities: normal  Conduction Disutrbances:right bundle branch block  Narrative Interpretation: Reviewed by Dr. Marnette Burgess  Old EKG Reviewed: none available    Right posterior rib pain   MDM  Patient here with right posterior rib pain s/p fall 1 week ago - is still having worsening pain with movement - no fracture noted on x-ray and reports pain better under control after dilaudid and valium - will write for the valium - she is already taking duragesic and oxycontin for her chronic pain.        Idalia Needle [place], [place] 02/01/12 [phone]

## 2012-02-01 NOTE — ED Notes (Signed)
Pt fell off porch last Thursday.  Reports R rib pain from fall.  Seen by PCP on Friday and sent to [facility] for x-rays.  Pt states pain is worse.

## 2012-02-01 NOTE — ED Provider Notes (Signed)
Medical screening examination/treatment/procedure(s) were performed by non-physician practitioner and as supervising physician I was immediately available for consultation/collaboration.  Teressa Lower, MD 02/01/12 [phone]

## 2012-05-06 ENCOUNTER — Other Ambulatory Visit: Payer: Self-pay | Admitting: Ophthalmology

## 2012-06-18 ENCOUNTER — Other Ambulatory Visit: Payer: Self-pay | Admitting: Internal Medicine

## 2012-06-18 ENCOUNTER — Other Ambulatory Visit: Payer: Self-pay | Admitting: Physician Assistant

## 2012-06-18 DIAGNOSIS — R197 Diarrhea, unspecified: Secondary | ICD-10-CM

## 2012-06-18 DIAGNOSIS — R109 Unspecified abdominal pain: Secondary | ICD-10-CM

## 2012-06-20 ENCOUNTER — Ambulatory Visit
Admission: RE | Admit: 2012-06-20 | Discharge: 2012-06-20 | Disposition: A | Discharge status: Home / Self Care | Payer: Medicare Other | Source: Ambulatory Visit | Attending: Internal Medicine | Admitting: Internal Medicine

## 2012-06-20 DIAGNOSIS — R197 Diarrhea, unspecified: Secondary | ICD-10-CM

## 2012-06-20 DIAGNOSIS — R109 Unspecified abdominal pain: Secondary | ICD-10-CM

## 2012-06-24 ENCOUNTER — Other Ambulatory Visit: Payer: Self-pay | Admitting: Physician Assistant

## 2012-06-24 DIAGNOSIS — R109 Unspecified abdominal pain: Secondary | ICD-10-CM

## 2012-06-24 DIAGNOSIS — R161 Splenomegaly, not elsewhere classified: Secondary | ICD-10-CM

## 2012-06-27 ENCOUNTER — Inpatient Hospital Stay
Admission: RE | Admit: 2012-06-27 | Discharge: 2012-06-27 | Payer: Medicare Other | Source: Ambulatory Visit | Attending: Physician Assistant | Admitting: Physician Assistant

## 2012-07-01 ENCOUNTER — Ambulatory Visit
Admission: RE | Admit: 2012-07-01 | Discharge: 2012-07-01 | Disposition: A | Discharge status: Home / Self Care | Payer: Medicare Other | Source: Ambulatory Visit | Attending: Physician Assistant | Admitting: Physician Assistant

## 2012-07-01 DIAGNOSIS — R161 Splenomegaly, not elsewhere classified: Secondary | ICD-10-CM

## 2012-07-01 DIAGNOSIS — R109 Unspecified abdominal pain: Secondary | ICD-10-CM

## 2012-07-01 MED ORDER — IOHEXOL 300 MG/ML  SOLN
100.0000 mL | Freq: Once | INTRAMUSCULAR | Status: AC | PRN
  Administered 2012-07-01: 100 mL via INTRAVENOUS

## 2012-07-02 ENCOUNTER — Telehealth: Payer: Self-pay | Admitting: Gastroenterology

## 2012-07-02 NOTE — Telephone Encounter (Signed)
Pts husband states his wife had an ultrasound of his liver and a CT recently by his PCP. Per pts husband they were told to follow-up with Dr. Deatra Ina. Pt scheduled to see Dr. Deatra Ina 07/08/12@9 :30am. Pt aware of appt date and time.

## 2012-07-08 ENCOUNTER — Encounter: Payer: Self-pay | Admitting: Gastroenterology

## 2012-07-08 ENCOUNTER — Ambulatory Visit (INDEPENDENT_AMBULATORY_CARE_PROVIDER_SITE_OTHER): Payer: Medicare Other | Admitting: Gastroenterology

## 2012-07-08 VITALS — BP 120/68 | HR 66 | Ht 62.0 in

## 2012-07-08 DIAGNOSIS — I8500 Esophageal varices without bleeding: Secondary | ICD-10-CM

## 2012-07-08 DIAGNOSIS — D126 Benign neoplasm of colon, unspecified: Secondary | ICD-10-CM

## 2012-07-08 DIAGNOSIS — K7460 Unspecified cirrhosis of liver: Secondary | ICD-10-CM

## 2012-07-08 DIAGNOSIS — R6881 Early satiety: Secondary | ICD-10-CM

## 2012-07-08 NOTE — Progress Notes (Signed)
History of Present Illness: The patient has returned for followup of cirrhosis. She has cryptogenic cirrhosis with portal hypertension. Endoscopy in 2012 demonstrated grade 1 esophageal varices. She recently underwent ultrasound and CT which demonstrated increasing splenomegaly and nodular liver consistent with cirrhosis. She continues to complain of intermittent right upper quadrant pain. She's also complaining of early satiety and occasional postprandial nausea. She's lost about 30 pounds over several months. Lab work including LFTs has been stable. In November, 2013 platelet count was 74,000. INR was normal.  Patient has a history of adenomatous polyps and was last examined 2009.    Past Medical History  Diagnosis Date  . Hx of shoulder replacement   . Diabetes mellitus     reports "borderline"   Past Surgical History  Procedure Date  . Hip surgery   . Shoulder surgery   . Back surgery   . Cholecystectomy   . Neck surgery    family history is not on file. Current Outpatient Prescriptions  Medication Sig Dispense Refill  . amitriptyline (ELAVIL) 10 MG tablet Take 10 mg by mouth at bedtime.      Marland Kitchen aspirin 81 MG tablet Take 81 mg by mouth daily.      . cholecalciferol (VITAMIN D) 1000 UNITS tablet Take 2,000 Units by mouth daily.      . colesevelam (WELCHOL) 625 MG tablet Take 1,875 mg by mouth 2 (two) times daily with a meal.      . fentaNYL (DURAGESIC - DOSED MCG/HR) 25 MCG/HR Place 1 patch onto the skin every 3 (three) days.      . ferrous sulfate 325 (65 FE) MG tablet Take 325 mg by mouth daily with breakfast.      . Flavocoxid (LIMBREL) 500 MG CAPS Take 500 mg by mouth.      . gabapentin (NEURONTIN) 100 MG capsule Take 100 mg by mouth 3 (three) times daily.      . Magnesium 250 MG TABS Take 250 mg by mouth daily at 6 (six) AM.      . metFORMIN (GLUCOPHAGE-XR) 500 MG 24 hr tablet Take 500 mg by mouth daily with breakfast.      . omega-3 acid ethyl esters (LOVAZA) 1 G capsule Take  2 g by mouth 2 (two) times daily.      Marland Kitchen omeprazole (PRILOSEC) 20 MG capsule TAKE 1 CAPSULE BY MOUTH 30 MINUTES BEFORE A MEAL DAILY  30 capsule  10  . oxyCODONE (OXYCONTIN) 10 MG 12 hr tablet Take 10 mg by mouth every 12 (twelve) hours.      . predniSONE (DELTASONE) 10 MG tablet Take 20 mg by mouth daily.      . raloxifene (EVISTA) 60 MG tablet Take 60 mg by mouth daily.      Marland Kitchen spironolactone (ALDACTONE) 25 MG tablet Take 25 mg by mouth daily.      . vitamin E 400 UNIT capsule Take 400 Units by mouth daily.       Allergies as of 07/08/2012 - Review Complete 07/08/2012  Allergen Reaction Noted  . Atorvastatin    . Diphenhydramine hcl    . Hydrocodone-acetaminophen    . Loratadine-pseudoephedrine er  06/10/2008  . Lorazepam  06/10/2008  . Simvastatin    . Sulfonamide derivatives      reports that she has never smoked. She does not have any smokeless tobacco history on file. She reports that she does not drink alcohol or use illicit drugs.     Review of Systems: Pertinent positive  and negative review of systems were noted in the above HPI section. All other review of systems were otherwise negative.  Vital signs were reviewed in today's medical record Physical Exam: General: Well developed , well nourished, no acute distress Head: Normocephalic and atraumatic Eyes:  sclerae anicteric, EOMI Ears: Normal auditory acuity Mouth: No deformity or lesions Neck: Supple, no masses or thyromegaly Lungs: Clear throughout to auscultation Heart: Regular rate and rhythm; no murmurs, rubs or bruits Abdomen: Soft, non tender and non distended. No masses,  or hernias noted. Normal Bowel sounds. There is no obvious ascites. Spleen tip is palpable Rectal:deferred Musculoskeletal: Symmetrical with no gross deformities  Skin: No lesions on visible extremities Pulses:  Normal pulses noted Extremities: No clubbing, cyanosis, edema or deformities noted Neurological: Alert oriented x 4, grossly  nonfocal Cervical Nodes:  No significant cervical adenopathy Inguinal Nodes: No significant inguinal adenopathy Psychological:  Alert and cooperative. Normal mood and affect

## 2012-07-08 NOTE — Assessment & Plan Note (Addendum)
Patient has cryptogenic cirrhosis with portal hypertension. Worsening splenomegaly suggests that portal hypertension is worsening.   Recommendations 1.  followup endoscopy

## 2012-07-08 NOTE — Assessment & Plan Note (Signed)
Etiology is not clear. Active peptic ulcer disease, gastroparesis are considerations.  Recommendations #1 upper endoscopy #2 to consider gastric emptying scan pending #1

## 2012-07-08 NOTE — Assessment & Plan Note (Signed)
Plan followup endoscopy

## 2012-07-08 NOTE — Assessment & Plan Note (Signed)
Plan followup colonoscopy 2014

## 2012-07-08 NOTE — Patient Instructions (Addendum)
You have been scheduled for an endoscopy with propofol. Please follow written instructions given to you at your visit today. If you use inhalers (even only as needed) or a CPAP machine, please bring them with you on the day of your procedure.

## 2012-07-16 ENCOUNTER — Ambulatory Visit (HOSPITAL_COMMUNITY)
Admission: RE | Admit: 2012-07-16 | Discharge: 2012-07-16 | Disposition: A | Discharge status: Home / Self Care | Payer: Medicare Other | Source: Ambulatory Visit | Attending: Gastroenterology | Admitting: Gastroenterology

## 2012-07-16 ENCOUNTER — Encounter (HOSPITAL_COMMUNITY): Admission: RE | Payer: Self-pay | Source: Ambulatory Visit

## 2012-07-16 ENCOUNTER — Encounter (HOSPITAL_COMMUNITY): Payer: Self-pay

## 2012-07-16 ENCOUNTER — Encounter (HOSPITAL_COMMUNITY)
Admission: RE | Disposition: A | Discharge status: Home / Self Care | Payer: Self-pay | Source: Ambulatory Visit | Attending: Gastroenterology

## 2012-07-16 ENCOUNTER — Ambulatory Visit (HOSPITAL_COMMUNITY): Admission: RE | Admit: 2012-07-16 | Payer: Medicare Other | Source: Ambulatory Visit | Admitting: Gastroenterology

## 2012-07-16 DIAGNOSIS — R7309 Other abnormal glucose: Secondary | ICD-10-CM | POA: Insufficient documentation

## 2012-07-16 DIAGNOSIS — Z79899 Other long term (current) drug therapy: Secondary | ICD-10-CM | POA: Insufficient documentation

## 2012-07-16 DIAGNOSIS — I8500 Esophageal varices without bleeding: Secondary | ICD-10-CM | POA: Insufficient documentation

## 2012-07-16 HISTORY — PX: GASTRIC VARICES BANDING: SHX5519

## 2012-07-16 HISTORY — PX: ESOPHAGOGASTRODUODENOSCOPY: SHX5428

## 2012-07-16 LAB — GLUCOSE, CAPILLARY: Glucose-Capillary: 78 mg/dL (ref 70–99)

## 2012-07-16 SURGERY — EGD (ESOPHAGOGASTRODUODENOSCOPY)
Anesthesia: Moderate Sedation

## 2012-07-16 MED ORDER — SODIUM CHLORIDE 0.9 % IV SOLN
INTRAVENOUS | Status: DC
  Administered 2012-07-16: 500 mL via INTRAVENOUS

## 2012-07-16 MED ORDER — FENTANYL CITRATE 0.05 MG/ML IJ SOLN
INTRAMUSCULAR | Status: AC
  Filled 2012-07-16: qty 2

## 2012-07-16 MED ORDER — MIDAZOLAM HCL 10 MG/2ML IJ SOLN
INTRAMUSCULAR | Status: DC | PRN
  Administered 2012-07-16: 1 mg via INTRAVENOUS
  Administered 2012-07-16: 2 mg via INTRAVENOUS

## 2012-07-16 MED ORDER — BUTAMBEN-TETRACAINE-BENZOCAINE 2-2-14 % EX AERO
INHALATION_SPRAY | CUTANEOUS | Status: DC | PRN
  Administered 2012-07-16: 2 via TOPICAL

## 2012-07-16 MED ORDER — MIDAZOLAM HCL 10 MG/2ML IJ SOLN
INTRAMUSCULAR | Status: AC
  Filled 2012-07-16: qty 2

## 2012-07-16 MED ORDER — FENTANYL CITRATE 0.05 MG/ML IJ SOLN
INTRAMUSCULAR | Status: DC | PRN
  Administered 2012-07-16: 25 ug via INTRAVENOUS

## 2012-07-16 MED ORDER — SODIUM CHLORIDE 0.9 % IV SOLN: INTRAVENOUS | Status: DC

## 2012-07-16 NOTE — H&P (View-Only) (Signed)
History of Present Illness: The patient has returned for followup of cirrhosis. She has cryptogenic cirrhosis with portal hypertension. Endoscopy in 2012 demonstrated grade 1 esophageal varices. She recently underwent ultrasound and CT which demonstrated increasing splenomegaly and nodular liver consistent with cirrhosis. She continues to complain of intermittent right upper quadrant pain. She's also complaining of early satiety and occasional postprandial nausea. She's lost about 30 pounds over several months. Lab work including LFTs has been stable. In November, 2013 platelet count was 74,000. INR was normal.  Patient has a history of adenomatous polyps and was last examined 2009.    Past Medical History  Diagnosis Date  . Hx of shoulder replacement   . Diabetes mellitus     reports "borderline"   Past Surgical History  Procedure Date  . Hip surgery   . Shoulder surgery   . Back surgery   . Cholecystectomy   . Neck surgery    family history is not on file. Current Outpatient Prescriptions  Medication Sig Dispense Refill  . amitriptyline (ELAVIL) 10 MG tablet Take 10 mg by mouth at bedtime.      Marland Kitchen aspirin 81 MG tablet Take 81 mg by mouth daily.      . cholecalciferol (VITAMIN D) 1000 UNITS tablet Take 2,000 Units by mouth daily.      . colesevelam (WELCHOL) 625 MG tablet Take 1,875 mg by mouth 2 (two) times daily with a meal.      . fentaNYL (DURAGESIC - DOSED MCG/HR) 25 MCG/HR Place 1 patch onto the skin every 3 (three) days.      . ferrous sulfate 325 (65 FE) MG tablet Take 325 mg by mouth daily with breakfast.      . Flavocoxid (LIMBREL) 500 MG CAPS Take 500 mg by mouth.      . gabapentin (NEURONTIN) 100 MG capsule Take 100 mg by mouth 3 (three) times daily.      . Magnesium 250 MG TABS Take 250 mg by mouth daily at 6 (six) AM.      . metFORMIN (GLUCOPHAGE-XR) 500 MG 24 hr tablet Take 500 mg by mouth daily with breakfast.      . omega-3 acid ethyl esters (LOVAZA) 1 G capsule Take  2 g by mouth 2 (two) times daily.      Marland Kitchen omeprazole (PRILOSEC) 20 MG capsule TAKE 1 CAPSULE BY MOUTH 30 MINUTES BEFORE A MEAL DAILY  30 capsule  10  . oxyCODONE (OXYCONTIN) 10 MG 12 hr tablet Take 10 mg by mouth every 12 (twelve) hours.      . predniSONE (DELTASONE) 10 MG tablet Take 20 mg by mouth daily.      . raloxifene (EVISTA) 60 MG tablet Take 60 mg by mouth daily.      Marland Kitchen spironolactone (ALDACTONE) 25 MG tablet Take 25 mg by mouth daily.      . vitamin E 400 UNIT capsule Take 400 Units by mouth daily.       Allergies as of 07/08/2012 - Review Complete 07/08/2012  Allergen Reaction Noted  . Atorvastatin    . Diphenhydramine hcl    . Hydrocodone-acetaminophen    . Loratadine-pseudoephedrine er  06/10/2008  . Lorazepam  06/10/2008  . Simvastatin    . Sulfonamide derivatives      reports that she has never smoked. She does not have any smokeless tobacco history on file. She reports that she does not drink alcohol or use illicit drugs.     Review of Systems: Pertinent positive  and negative review of systems were noted in the above HPI section. All other review of systems were otherwise negative.  Vital signs were reviewed in today's medical record Physical Exam: General: Well developed , well nourished, no acute distress Head: Normocephalic and atraumatic Eyes:  sclerae anicteric, EOMI Ears: Normal auditory acuity Mouth: No deformity or lesions Neck: Supple, no masses or thyromegaly Lungs: Clear throughout to auscultation Heart: Regular rate and rhythm; no murmurs, rubs or bruits Abdomen: Soft, non tender and non distended. No masses,  or hernias noted. Normal Bowel sounds. There is no obvious ascites. Spleen tip is palpable Rectal:deferred Musculoskeletal: Symmetrical with no gross deformities  Skin: No lesions on visible extremities Pulses:  Normal pulses noted Extremities: No clubbing, cyanosis, edema or deformities noted Neurological: Alert oriented x 4, grossly  nonfocal Cervical Nodes:  No significant cervical adenopathy Inguinal Nodes: No significant inguinal adenopathy Psychological:  Alert and cooperative. Normal mood and affect

## 2012-07-16 NOTE — Op Note (Signed)
[facility] [place], [address]   ENDOSCOPY PROCEDURE REPORT  PATIENT: [name], [name]  MR#: [id number] BIRTHDATE: May 30, 1946 , 42  yrs. old GENDER: Female ENDOSCOPIST: Inda Castle, MD REFERRED BY:  Rachel Moulds, D.O. PROCEDURE DATE:  07/16/2012 PROCEDURE:  EGD, diagnostic ASA CLASS:     Class III INDICATIONS:  Surveillance. MEDICATIONS: These medications were titrated to patient response per physician's verbal order, Versed 3 mg IV, and Fentanyl 25 mcg IV TOPICAL ANESTHETIC: Cetacaine Spray  DESCRIPTION OF PROCEDURE: After the risks benefits and alternatives of the procedure were thoroughly explained, informed consent was obtained.  The Pentax Gastroscope [id number] endoscope was introduced through the mouth and advanced to the third portion of the duodenum. Without limitations.  The instrument was slowly withdrawn as the mucosa was fully examined.      Again noted were grade 1 varices in the very distal esophagus.   The remainder of the upper endoscopy exam was otherwise normal. Retroflexed views revealed no abnormalities.     The scope was then withdrawn from the patient and the procedure completed.  COMPLICATIONS: There were no complications. ENDOSCOPIC IMPRESSION: 1.   grade 1 varices   RECOMMENDATIONS: 1.  continue current medications 2.. gastric emptying scan REPEAT EXAM:  eSigned:  Inda Castle, MD 07/16/2012 11:56 AM   CC:

## 2012-07-16 NOTE — Interval H&P Note (Signed)
History and Physical Interval Note:  07/16/2012 11:26 AM  [name]  has presented today for surgery, with the diagnosis of cirrhoissis  The various methods of treatment have been discussed with the patient and family. After consideration of risks, benefits and other options for treatment, the patient has consented to  Procedure(s) (LRB) with comments: ESOPHAGOGASTRODUODENOSCOPY (EGD) (N/A) GASTRIC VARICES BANDING (N/A) as a surgical intervention .  The patient's history has been reviewed, patient examined, no change in status, stable for surgery.  I have reviewed the patient's chart and labs.  Questions were answered to the patient's satisfaction.    The recent H&P (dated *07/08/12**) was reviewed, the patient was examined and there is no change in the patients condition since that H&P was completed.   Erskine Emery  07/16/2012, 11:26 AM    Erskine Emery

## 2012-07-17 ENCOUNTER — Encounter (HOSPITAL_COMMUNITY): Payer: Self-pay | Admitting: Gastroenterology

## 2012-07-18 ENCOUNTER — Telehealth: Payer: Self-pay

## 2012-07-18 ENCOUNTER — Other Ambulatory Visit: Payer: Self-pay | Admitting: Gastroenterology

## 2012-07-18 DIAGNOSIS — R109 Unspecified abdominal pain: Secondary | ICD-10-CM

## 2012-07-18 NOTE — Telephone Encounter (Signed)
Pt scheduled for GES at [facility] 08/14/12. Pt to arrive at 9:45am for a 10am appt. Pt to hold stomach meds for 24 hours prior to procedure. Pt aware.

## 2012-08-14 ENCOUNTER — Encounter (HOSPITAL_COMMUNITY): Payer: Self-pay

## 2012-08-14 ENCOUNTER — Encounter (HOSPITAL_COMMUNITY)
Admission: RE | Admit: 2012-08-14 | Discharge: 2012-08-14 | Disposition: A | Discharge status: Home / Self Care | Payer: Medicare Other | Source: Ambulatory Visit | Attending: Gastroenterology | Admitting: Gastroenterology

## 2012-08-14 DIAGNOSIS — R109 Unspecified abdominal pain: Secondary | ICD-10-CM | POA: Insufficient documentation

## 2012-08-14 DIAGNOSIS — R112 Nausea with vomiting, unspecified: Secondary | ICD-10-CM | POA: Insufficient documentation

## 2012-08-14 DIAGNOSIS — E119 Type 2 diabetes mellitus without complications: Secondary | ICD-10-CM | POA: Insufficient documentation

## 2012-08-14 MED ORDER — TECHNETIUM TC 99M SULFUR COLLOID
1.9000 | Freq: Once | INTRAVENOUS | Status: AC | PRN
  Administered 2012-08-14: 1.9 via INTRAVENOUS

## 2012-08-15 NOTE — Progress Notes (Signed)
Quick Note:  Please inform the patient that GES was normal and to continue current plan of action ______

## 2013-01-19 ENCOUNTER — Other Ambulatory Visit: Payer: Self-pay | Admitting: Gastroenterology

## 2013-04-16 ENCOUNTER — Encounter: Payer: Self-pay | Admitting: Gastroenterology

## 2013-04-18 ENCOUNTER — Other Ambulatory Visit: Payer: Self-pay | Admitting: Gastroenterology

## 2013-06-29 ENCOUNTER — Telehealth: Payer: Self-pay | Admitting: Internal Medicine

## 2013-06-29 NOTE — Telephone Encounter (Signed)
Absolutely no 2 tablets would be way to high dose ---  Recommended max dose for citalopram is 20 mg    So she should only take 1/2 tab of citalopram 40 mg  = 20 mg !!!!!!

## 2013-06-29 NOTE — Telephone Encounter (Signed)
KRISTIN, NURSE,  CALLED SAID PTS FAMILY SAID PT IS TO TAKE TWO 40MG CITALAPRAM DAILY.  BOTTLE SAYS ONE 40 MG DAILY.  PLEASE ADVISE FAMILY.

## 2013-07-13 ENCOUNTER — Other Ambulatory Visit: Payer: Self-pay | Admitting: Emergency Medicine

## 2013-07-13 MED ORDER — IPRATROPIUM BROMIDE 0.02 % IN SOLN: 0.5000 mg | Freq: Four times a day (QID) | RESPIRATORY_TRACT | Status: DC

## 2013-07-28 ENCOUNTER — Other Ambulatory Visit: Payer: Self-pay | Admitting: Internal Medicine

## 2013-08-17 ENCOUNTER — Other Ambulatory Visit: Payer: Self-pay | Admitting: Physician Assistant

## 2013-08-26 ENCOUNTER — Ambulatory Visit: Payer: Medicare Other | Admitting: Emergency Medicine

## 2013-08-28 ENCOUNTER — Encounter: Payer: Self-pay | Admitting: Emergency Medicine

## 2013-08-28 ENCOUNTER — Ambulatory Visit (INDEPENDENT_AMBULATORY_CARE_PROVIDER_SITE_OTHER): Payer: Medicare Other | Admitting: Emergency Medicine

## 2013-08-28 VITALS — BP 128/70 | HR 78 | Temp 98.4°F | Resp 18 | Ht 62.5 in | Wt 141.0 lb

## 2013-08-28 DIAGNOSIS — I10 Essential (primary) hypertension: Secondary | ICD-10-CM

## 2013-08-28 DIAGNOSIS — E119 Type 2 diabetes mellitus without complications: Secondary | ICD-10-CM

## 2013-08-28 DIAGNOSIS — R221 Localized swelling, mass and lump, neck: Secondary | ICD-10-CM

## 2013-08-28 DIAGNOSIS — E782 Mixed hyperlipidemia: Secondary | ICD-10-CM

## 2013-08-28 DIAGNOSIS — Z23 Encounter for immunization: Secondary | ICD-10-CM

## 2013-08-28 LAB — BASIC METABOLIC PANEL WITH GFR
BUN: 11 mg/dL (ref 6–23)
CHLORIDE: 103 meq/L (ref 96–112)
CO2: 32 meq/L (ref 19–32)
CREATININE: 0.87 mg/dL (ref 0.50–1.10)
Calcium: 9.2 mg/dL (ref 8.4–10.5)
GFR, EST NON AFRICAN AMERICAN: 70 mL/min
GFR, Est African American: 80 mL/min
GLUCOSE: 74 mg/dL (ref 70–99)
POTASSIUM: 4.9 meq/L (ref 3.5–5.3)
Sodium: 140 mEq/L (ref 135–145)

## 2013-08-28 LAB — LIPID PANEL
CHOLESTEROL: 165 mg/dL (ref 0–200)
HDL: 45 mg/dL (ref >39)
LDL Cholesterol: 83 mg/dL (ref 0–99)
TRIGLYCERIDES: 185 mg/dL — AB (ref <150)
Total CHOL/HDL Ratio: 3.7 Ratio
VLDL: 37 mg/dL (ref 0–40)

## 2013-08-28 LAB — CBC WITH DIFFERENTIAL/PLATELET
Basophils Absolute: 0 10*3/uL (ref 0.0–0.1)
Basophils Relative: 0 % (ref 0–1)
EOS ABS: 0.1 10*3/uL (ref 0.0–0.7)
EOS PCT: 2 % (ref 0–5)
HEMATOCRIT: 43.9 % (ref 36.0–46.0)
HEMOGLOBIN: 14.7 g/dL (ref 12.0–15.0)
LYMPHS ABS: 1.8 10*3/uL (ref 0.7–4.0)
LYMPHS PCT: 33 % (ref 12–46)
MCH: 30.4 pg (ref 26.0–34.0)
MCHC: 33.5 g/dL (ref 30.0–36.0)
MCV: 90.9 fL (ref 78.0–100.0)
MONOS PCT: 8 % (ref 3–12)
Monocytes Absolute: 0.5 10*3/uL (ref 0.1–1.0)
Neutro Abs: 3.1 10*3/uL (ref 1.7–7.7)
Neutrophils Relative %: 57 % (ref 43–77)
PLATELETS: 81 10*3/uL — AB (ref 150–400)
RBC: 4.83 MIL/uL (ref 3.87–5.11)
RDW: 14.1 % (ref 11.5–15.5)
WBC: 5.5 10*3/uL (ref 4.0–10.5)

## 2013-08-28 LAB — HEMOGLOBIN A1C
Hgb A1c MFr Bld: 5.2 % (ref <5.7)
Mean Plasma Glucose: 103 mg/dL (ref <117)

## 2013-08-28 LAB — HEPATIC FUNCTION PANEL
ALBUMIN: 4.1 g/dL (ref 3.5–5.2)
ALT: 25 U/L (ref 0–35)
AST: 21 U/L (ref 0–37)
Alkaline Phosphatase: 81 U/L (ref 39–117)
BILIRUBIN DIRECT: 0.2 mg/dL (ref 0.0–0.3)
Indirect Bilirubin: 0.5 mg/dL (ref 0.0–0.9)
Total Bilirubin: 0.7 mg/dL (ref 0.3–1.2)
Total Protein: 6.2 g/dL (ref 6.0–8.3)

## 2013-08-28 NOTE — Patient Instructions (Signed)
Diabetes and Exercise Exercising regularly is important. It is not just about losing weight. It has many health benefits, such as:  Improving your overall fitness, flexibility, and endurance.  Increasing your bone density.  Helping with weight control.  Decreasing your body fat.  Increasing your muscle strength.  Reducing stress and tension.  Improving your overall health. People with diabetes who exercise gain additional benefits because exercise:  Reduces appetite.  Improves the body's use of blood sugar (glucose).  Helps lower or control blood glucose.  Decreases blood pressure.  Helps control blood lipids (such as cholesterol and triglycerides).  Improves the body's use of the hormone insulin by:  Increasing the body's insulin sensitivity.  Reducing the body's insulin needs.  Decreases the risk for heart disease because exercising:  Lowers cholesterol and triglycerides levels.  Increases the levels of good cholesterol (such as high-density lipoproteins [HDL]) in the body.  Lowers blood glucose levels. YOUR ACTIVITY PLAN  Choose an activity that you enjoy and set realistic goals. Your health care provider or diabetes educator can help you make an activity plan that works for you. You can break activities into 2 or 3 sessions throughout the day. Doing so is as good as one long session. Exercise ideas include:  Taking the dog for a walk.  Taking the stairs instead of the elevator.  Dancing to your favorite song.  Doing your favorite exercise with a friend. RECOMMENDATIONS FOR EXERCISING WITH TYPE 1 OR TYPE 2 DIABETES   Check your blood glucose before exercising. If blood glucose levels are greater than 240 mg/dL, check for urine ketones. Do not exercise if ketones are present.  Avoid injecting insulin into areas of the body that are going to be exercised. For example, avoid injecting insulin into:  The arms when playing tennis.  The legs when  jogging.  Keep a record of:  Food intake before and after you exercise.  Expected peak times of insulin action.  Blood glucose levels before and after you exercise.  The type and amount of exercise you have done.  Review your records with your health care provider. Your health care provider will help you to develop guidelines for adjusting food intake and insulin amounts before and after exercising.  If you take insulin or oral hypoglycemic agents, watch for signs and symptoms of hypoglycemia. They include:  Dizziness.  Shaking.  Sweating.  Chills.  Confusion.  Drink plenty of water while you exercise to prevent dehydration or heat stroke. Body water is lost during exercise and must be replaced.  Talk to your health care provider before starting an exercise program to make sure it is safe for you. Remember, almost any type of activity is better than none. Document Released: 10/13/2003 Document Revised: 03/25/2013 Document Reviewed: 12/30/2012 [facility] Patient Information 2014 [place].

## 2013-08-28 NOTE — Progress Notes (Addendum)
Subjective:    Patient ID: [name], female    DOB: August 07, 1945, 68 y.o.   MRN: [id number]  HPI Comments: 68 yo presents for 3 month F/U for HTN, Cholesterol, DM, D. Deficient. She has not been eating healthy or exercising AD. She has increased soda intake. LAST LABS T 170 TG 251 H 41 L 79 INSULIN 30 A1C 5.2 SHE HAS RECENTLY BEEN ON PREDNISONE PER ORTHO, she is not checking BS  She is pain management for chronic pain but notes has come off all prescriptions except Tylenol #3, which she take 4 1/2's daily per husband. Husband notes he has decreased her medicine because it was not helping with pain and was only making her lethargic. He notes she has improved energy with decreased doses. They would like to get Rx filled at our office and stop pain management if possible.  She has noticed the mass on he neck has returned. She notes two previous removals in the past.  She denies any difficulty with swallowing or pain in mass.  Hyperlipidemia     Medication List       This list is accurate as of: 08/28/13 11:59 PM.  Always use your most recent med list.               acetaminophen-codeine 300-30 MG per tablet  Commonly known as:  TYLENOL #3  Take by mouth every 4 (four) hours as needed for moderate pain.     amitriptyline 10 MG tablet  Commonly known as:  ELAVIL  Take 10 mg by mouth at bedtime.     aspirin 81 MG tablet  Take 81 mg by mouth daily.     citalopram 40 MG tablet  Commonly known as:  CELEXA  Take 40 mg by mouth daily.     cyclobenzaprine 10 MG tablet  Commonly known as:  FLEXERIL  Take 10 mg by mouth 3 (three) times daily as needed for muscle spasms.     ferrous sulfate 325 (65 FE) MG tablet  Take 325 mg by mouth daily with breakfast.     ferrous sulfate 325 (65 FE) MG tablet  TAKE 1 TABLET TWICE DAILY     Fish Oil 1000 MG Caps  Take 1,000 mg by mouth 2 (two) times daily.     gabapentin 300 MG capsule  Commonly known as:  NEURONTIN  Take 300 mg by  mouth 3 (three) times daily.     Magnesium 250 MG Tabs  Take 250 mg by mouth daily at 6 (six) AM.     metFORMIN 500 MG 24 hr tablet  Commonly known as:  GLUCOPHAGE-XR  Take 500 mg by mouth 4 (four) times daily.     OVER THE COUNTER MEDICATION  100 mg as needed. Stool softner     OVER THE COUNTER MEDICATION  as needed. Natural Veg Laxative     spironolactone 25 MG tablet  Commonly known as:  ALDACTONE  TAKE 1 TABLET EVERY MORNING (STOP FUROSEMIDE AND METAXOLONE)     tamoxifen 20 MG tablet  Commonly known as:  NOLVADEX  Take 20 mg by mouth daily.     Vitamin D-3 1000 UNITS Caps  Take 5,000 Units by mouth daily.       ALLERGIES Atorvastatin; Diphenhydramine hcl; Hydrocodone-acetaminophen; Loratadine-pseudoephedrine er; Lorazepam; Simvastatin; and Sulfonamide derivatives  Past Medical History  Diagnosis Date  . Hx of shoulder replacement   . Diabetes mellitus     reports "borderline"  . Type II or  unspecified type diabetes mellitus without mention of complication, not stated as uncontrolled 08/31/2013      Review of Systems  Constitutional: Positive for fatigue.  Musculoskeletal: Positive for arthralgias.  All other systems reviewed and are negative.   BP 128/70  Pulse 78  Temp(Src) 98.4 F (36.9 C) (Oral)  Resp 18  Ht 5' 2.5" (1.588 m)  Wt 141 lb (63.957 kg)  BMI 25.36 kg/m2     Objective:   Physical Exam  Nursing note and vitals reviewed. Constitutional: She is oriented to person, place, and time. She appears well-developed and well-nourished. No distress.  HENT:  Head: Normocephalic and atraumatic.  Right Ear: External ear normal.  Left Ear: External ear normal.  Nose: Nose normal.  Mouth/Throat: Oropharynx is clear and moist.  Eyes: Conjunctivae and EOM are normal.  Neck: Normal range of motion. Neck supple. No JVD present. No thyromegaly present.    Cardiovascular: Normal rate, regular rhythm, normal heart sounds and intact distal pulses.     Pulmonary/Chest: Effort normal and breath sounds normal.  Abdominal: Soft. Bowel sounds are normal. She exhibits no distension and no mass. There is no tenderness. There is no rebound and no guarding.  Musculoskeletal: Normal range of motion. She exhibits no edema and no tenderness.  Lymphadenopathy:    She has no cervical adenopathy.  Neurological: She is alert and oriented to person, place, and time. No cranial nerve deficit.  Skin: Skin is warm and dry. No rash noted. No erythema. No pallor.  Psychiatric: She has a normal mood and affect. Her behavior is normal. Judgment and thought content normal.          Assessment & Plan:  1.  3 month F/U for HTN, Cholesterol,DM, D. Deficient. Needs healthy diet, cardio QD and obtain healthy weight. Check Labs, Check BP if >130/80 call office, Check BS if >200 call office 2. Left neck mass with ? Increase in size- repeat scan to evaluate 3. Chronic pain management wants to d/c Pain management clinic and get Tylenol #3 refilled with [place]. Patient aware that is the only Pain RX we will fill

## 2013-08-31 ENCOUNTER — Encounter: Payer: Self-pay | Admitting: Emergency Medicine

## 2013-09-02 ENCOUNTER — Ambulatory Visit
Admission: RE | Admit: 2013-09-02 | Discharge: 2013-09-02 | Disposition: A | Discharge status: Home / Self Care | Payer: Medicare Other | Source: Ambulatory Visit | Attending: Emergency Medicine | Admitting: Emergency Medicine

## 2013-09-02 DIAGNOSIS — R221 Localized swelling, mass and lump, neck: Secondary | ICD-10-CM

## 2013-09-02 MED ORDER — IOHEXOL 300 MG/ML  SOLN
75.0000 mL | Freq: Once | INTRAMUSCULAR | Status: AC | PRN
  Administered 2013-09-02: 75 mL via INTRAVENOUS

## 2013-10-13 ENCOUNTER — Ambulatory Visit (INDEPENDENT_AMBULATORY_CARE_PROVIDER_SITE_OTHER): Payer: Medicare Other | Admitting: Physician Assistant

## 2013-10-13 ENCOUNTER — Ambulatory Visit (HOSPITAL_COMMUNITY)
Admission: RE | Admit: 2013-10-13 | Discharge: 2013-10-13 | Disposition: A | Discharge status: Home / Self Care | Payer: Medicare Other | Source: Ambulatory Visit | Attending: Physician Assistant | Admitting: Physician Assistant

## 2013-10-13 ENCOUNTER — Encounter: Payer: Self-pay | Admitting: Physician Assistant

## 2013-10-13 VITALS — BP 128/62 | HR 80 | Temp 98.1°F | Resp 16 | Wt 143.0 lb

## 2013-10-13 DIAGNOSIS — R0989 Other specified symptoms and signs involving the circulatory and respiratory systems: Secondary | ICD-10-CM | POA: Insufficient documentation

## 2013-10-13 DIAGNOSIS — Z87311 Personal history of (healed) other pathological fracture: Secondary | ICD-10-CM | POA: Insufficient documentation

## 2013-10-13 DIAGNOSIS — R05 Cough: Secondary | ICD-10-CM | POA: Insufficient documentation

## 2013-10-13 DIAGNOSIS — R0600 Dyspnea, unspecified: Secondary | ICD-10-CM

## 2013-10-13 DIAGNOSIS — R059 Cough, unspecified: Secondary | ICD-10-CM | POA: Insufficient documentation

## 2013-10-13 DIAGNOSIS — K7460 Unspecified cirrhosis of liver: Secondary | ICD-10-CM

## 2013-10-13 DIAGNOSIS — K219 Gastro-esophageal reflux disease without esophagitis: Secondary | ICD-10-CM

## 2013-10-13 DIAGNOSIS — R1310 Dysphagia, unspecified: Secondary | ICD-10-CM

## 2013-10-13 DIAGNOSIS — R0609 Other forms of dyspnea: Secondary | ICD-10-CM

## 2013-10-13 LAB — CBC WITH DIFFERENTIAL/PLATELET
BASOS PCT: 0 % (ref 0–1)
Basophils Absolute: 0 10*3/uL (ref 0.0–0.1)
EOS PCT: 2 % (ref 0–5)
Eosinophils Absolute: 0.1 10*3/uL (ref 0.0–0.7)
HEMATOCRIT: 42.2 % (ref 36.0–46.0)
HEMOGLOBIN: 14.1 g/dL (ref 12.0–15.0)
LYMPHS PCT: 27 % (ref 12–46)
Lymphs Abs: 1.1 10*3/uL (ref 0.7–4.0)
MCH: 30.4 pg (ref 26.0–34.0)
MCHC: 33.4 g/dL (ref 30.0–36.0)
MCV: 90.9 fL (ref 78.0–100.0)
MONO ABS: 0.3 10*3/uL (ref 0.1–1.0)
MONOS PCT: 7 % (ref 3–12)
NEUTROS ABS: 2.6 10*3/uL (ref 1.7–7.7)
Neutrophils Relative %: 64 % (ref 43–77)
Platelets: 78 10*3/uL — ABNORMAL LOW (ref 150–400)
RBC: 4.64 MIL/uL (ref 3.87–5.11)
RDW: 14.2 % (ref 11.5–15.5)
WBC: 4.1 10*3/uL (ref 4.0–10.5)

## 2013-10-13 LAB — HEPATIC FUNCTION PANEL
ALBUMIN: 3.8 g/dL (ref 3.5–5.2)
ALK PHOS: 78 U/L (ref 39–117)
ALT: 16 U/L (ref 0–35)
AST: 16 U/L (ref 0–37)
BILIRUBIN INDIRECT: 0.4 mg/dL (ref 0.2–1.2)
Bilirubin, Direct: 0.1 mg/dL (ref 0.0–0.3)
TOTAL PROTEIN: 5.9 g/dL — AB (ref 6.0–8.3)
Total Bilirubin: 0.5 mg/dL (ref 0.2–1.2)

## 2013-10-13 LAB — PROTIME-INR
INR: 0.97 (ref <1.50)
Prothrombin Time: 12.8 seconds (ref 11.6–15.2)

## 2013-10-13 LAB — BASIC METABOLIC PANEL WITH GFR
BUN: 13 mg/dL (ref 6–23)
CHLORIDE: 102 meq/L (ref 96–112)
CO2: 31 mEq/L (ref 19–32)
CREATININE: 0.81 mg/dL (ref 0.50–1.10)
Calcium: 9.4 mg/dL (ref 8.4–10.5)
GFR, Est African American: 88 mL/min
GFR, Est Non African American: 76 mL/min
GLUCOSE: 116 mg/dL — AB (ref 70–99)
POTASSIUM: 4.2 meq/L (ref 3.5–5.3)
Sodium: 141 mEq/L (ref 135–145)

## 2013-10-13 MED ORDER — PROMETHAZINE-CODEINE 6.25-10 MG/5ML PO SYRP: 5.0000 mL | ORAL_SOLUTION | Freq: Four times a day (QID) | ORAL | Status: DC | PRN

## 2013-10-13 MED ORDER — FUROSEMIDE 40 MG PO TABS: 40.0000 mg | ORAL_TABLET | Freq: Every day | ORAL | Status: DC

## 2013-10-13 NOTE — Progress Notes (Signed)
Subjective:    Patient ID: [name], female    DOB: 1946-02-06, 68 y.o.   MRN: [id number]  HPI 68 y.o. female with a history of esophagitis, asthma, presents with chronic cough, trouble swallowing. Her husband is here with her. Nonproductive cough, for 3 weeks getting progressively worse with chest tightness, wheezing, SOB. Denies fever or chills, orthopnea, PND ( except if she gets on her right side she coughs more), edema.. She has nebulizer at home but only uses once daily per patient but husband states only 1-2 a week. Her husband states she is getting fluid in her stomach, it is very distended. She is up 6 lbs. Complains of easyiing bruising/bleeding.  Wt Readings from Last 3 Encounters:  10/13/13 143 lb (64.864 kg)  08/28/13 141 lb (63.957 kg)  07/16/12 136 lb (61.689 kg)   She states that she has been choking on food and drink intermittently for a long time but worse the past week or two. No undigested food.Feels like it gets stuck in her throat, denies heart burn/water brash. She was on protonix twice daily but currenlty on it once daily She follows with Dr. Deatra Ina.   Last EGD 07/16/2012 Dr. Deatra Ina, stage I varices and gastritis, with normal gastric emptying test after EGD. 07/01/2012 CT AB showed: Hepatic cirrhosis and portal hypertension with recanalized periumbilical vein. Tiny low density lesion in the right hepatic lobe, segment six, likely represents a cyst.  Husband states hearing is worse, denies tinnitus.   Current Outpatient Prescriptions on File Prior to Visit  Medication Sig Dispense Refill  . acetaminophen-codeine (TYLENOL #3) 300-30 MG per tablet Take by mouth every 4 (four) hours as needed for moderate pain.      Marland Kitchen amitriptyline (ELAVIL) 10 MG tablet Take 10 mg by mouth at bedtime.      Marland Kitchen aspirin 81 MG tablet Take 81 mg by mouth daily.      . Cholecalciferol (VITAMIN D-3) 1000 UNITS CAPS Take 5,000 Units by mouth daily.      . citalopram (CELEXA) 40 MG tablet Take  40 mg by mouth daily.      . cyclobenzaprine (FLEXERIL) 10 MG tablet Take 10 mg by mouth 3 (three) times daily as needed for muscle spasms.      . ferrous sulfate 325 (65 FE) MG tablet TAKE 1 TABLET TWICE DAILY  60 tablet  5  . gabapentin (NEURONTIN) 300 MG capsule Take 300 mg by mouth 3 (three) times daily.      . Magnesium 250 MG TABS Take 250 mg by mouth daily at 6 (six) AM.      . metFORMIN (GLUCOPHAGE-XR) 500 MG 24 hr tablet Take 500 mg by mouth 4 (four) times daily.       . Omega-3 Fatty Acids (FISH OIL) 1000 MG CAPS Take 1,000 mg by mouth 2 (two) times daily.      Marland Kitchen OVER THE COUNTER MEDICATION 100 mg as needed. Stool softner      . OVER THE COUNTER MEDICATION as needed. Natural Veg Laxative      . spironolactone (ALDACTONE) 25 MG tablet TAKE 1 TABLET EVERY MORNING (STOP FUROSEMIDE AND METAXOLONE)  90 tablet  1  . tamoxifen (NOLVADEX) 20 MG tablet Take 20 mg by mouth daily.       No current facility-administered medications on file prior to visit.   Past Medical History  Diagnosis Date  . Hx of shoulder replacement   . Diabetes mellitus     reports "borderline"  .  Type II or unspecified type diabetes mellitus without mention of complication, not stated as uncontrolled 08/31/2013   Review of Systems  Constitutional: Positive for activity change and fatigue. Negative for fever, chills, diaphoresis and appetite change.  HENT: Negative.   Eyes: Negative.   Respiratory: Positive for cough, shortness of breath and wheezing. Negative for chest tightness.   Cardiovascular: Negative.   Gastrointestinal: Positive for abdominal pain and abdominal distention. Negative for nausea, diarrhea, constipation, blood in stool and anal bleeding.  Genitourinary: Negative.   Musculoskeletal: Negative.   Neurological: Negative.        Objective:   Physical Exam  Constitutional: She is oriented to person, place, and time. She appears well-developed and well-nourished.  HENT:  Head: Normocephalic  and atraumatic.  Right Ear: External ear normal.  Left Ear: External ear normal.  Mouth/Throat: Oropharynx is clear and moist.  Eyes: Conjunctivae and EOM are normal. Pupils are equal, round, and reactive to light.  Neck: Normal range of motion. Neck supple. No thyromegaly present.  Cardiovascular: Normal rate, regular rhythm and normal heart sounds.  Exam reveals no gallop and no friction rub.   No murmur heard. Pulmonary/Chest: Effort normal. No respiratory distress. She has wheezes.  Abdominal: Soft. Bowel sounds are normal. She exhibits distension, fluid wave and ascites (questionable). She exhibits no mass. There is tenderness in the epigastric area. There is no rigidity, no rebound, no guarding and no CVA tenderness.  Musculoskeletal: Normal range of motion.  Lymphadenopathy:    She has no cervical adenopathy.  Neurological: She is alert and oriented to person, place, and time. She displays normal reflexes. No cranial nerve deficit. Coordination normal.  Skin: Skin is warm and dry.  Psychiatric: She has a normal mood and affect.      Assessment & Plan:  Cough-? Asthma but lungs sound clear, will get a CXR to rule out edema, trouble lying on right side, vs GERD increase protonix to BID Dysphagia- ? From GERD, see above, versus esophageal dismotility- follow up Dr. Deatra Ina Cirrhosis- ? Ascites will get U/S, check BNP, ? Need for fluid pill with 6 lb weight gain, add lasix 32m only take 1/2 daily until we get labs back.  OVER 40 minutes of exam, counseling, chart review, referral performed

## 2013-10-13 NOTE — Patient Instructions (Signed)
Please take the fluid pill only 1/2 a pill in the morning until we get the labs back Call and set up an appointment with Dr. Deatra Ina-  Phone: [phone];     Ascites Ascites is a gathering of fluid in the belly (abdomen). This is most often caused by liver disease. It may also be caused by a number of other less common problems. It causes a ballooning out (distension) of the abdomen. CAUSES  Scarring of the liver (cirrhosis) is the most common cause of ascites. Other causes include:  Infection or inflammation in the abdomen.  Cancer in the abdomen.  Heart failure.  Certain forms of kidney failure (nephritic syndrome).  Inflammation of the pancreas.  Clots in the veins of the liver. SYMPTOMS  In the early stages of ascites, you may not have any symptoms. The main symptom of ascites is a sense of abdominal bloating. This is due to the presence of fluid. This may also cause an increase in abdominal or waist size. People with this condition can develop swelling in the legs, and men can develop a swollen scrotum. When there is a lot of fluid, it may be hard to breath. Stretching of the abdomen by fluid can be painful. DIAGNOSIS  Certain features of your medical history, such as a history of liver disease and of an enlarging abdomen, can suggest the presence of ascites. The diagnosis of ascites can be made on physical exam by your caregiver. An abdominal ultrasound examination can confirm that ascites is present, and estimate the amount of fluid. Once ascites is confirmed, it is important to determine its cause. Again, a history of one of the conditions listed in "CAUSES" provides a strong clue. A physical exam is important, and blood and X-ray tests may be needed. During a procedure called paracentesis, a sample of fluid is removed from the abdomen. This can determine certain key features about the fluid, such as whether or not infection or cancer is present. Your caregiver will determine if a  paracentesis is necessary. They will describe the procedure to you. PREVENTION  Ascites is a complication of other conditions. Therefore to prevent ascites, you must seek treatment for any significant health conditions you have. Once ascites is present, careful attention to fluid and salt intake may help prevent it from getting worse. If you have ascites, you should not drink alcohol. PROGNOSIS  The prognosis of ascites depends on the underlying disease. If the disease is reversible, such as with certain infections or with heart failure, then ascites may improve or disappear. When ascites is caused by cirrhosis, then it indicates that the liver disease has worsened, and further evaluation and treatment of the liver disease is needed. If your ascites is caused by cancer, then the success or failure of the cancer treatment will determine whether your ascites will improve or worsen. RISKS AND COMPLICATIONS  Ascites is likely to worsen if it is not properly diagnosed and treated. A large amount of ascites can cause pain and difficulty breathing. The main complication, besides worsening, is infection (called spontaneous bacterial peritonitis). This requires prompt treatment. TREATMENT  The treatment of ascites depends on its cause. When liver disease is your cause, medical management using water pills (diuretics) and decreasing salt intake is often effective. Ascites due to peritoneal inflammation or malignancy (cancer) alone does not respond to salt restriction and diuretics. Hospitalization is sometimes required. If the treatment of ascites cannot be managed with medications, a number of other treatments are available. Your caregivers  will help you decide which will work best for you. Some of these are:  Removal of fluid from the abdomen (paracentesis).  Fluid from the abdomen is passed into a vein (peritoneovenous shunting).  Liver transplantation.  Transjugular intrahepatic portosystemic stent  shunt. HOME CARE INSTRUCTIONS  It is important to monitor body weight and the intake and output of fluids. Weigh yourself at the same time every day. Record your weights. Fluid restriction may be necessary. It is also important to know your salt intake. The more salt you take in, the more fluid you will retain. Ninety percent of people with ascites respond to this approach.  Follow any directions for medicines carefully.  Follow up with your caregiver, as directed.  Report any changes in your health, especially any new or worsening symptoms.  If your ascites is from liver disease, avoid alcohol and other substances toxic to the liver. SEEK MEDICAL CARE IF:   Your weight increases more than a few pounds in a few days.  Your abdominal or waist size increases.  You develop swelling in your legs.  You had swelling and it worsens. SEEK IMMEDIATE MEDICAL CARE IF:   You develop a fever.  You develop new abdominal pain.  You develop difficulty breathing.  You develop confusion.  You have bleeding from the mouth, stomach, or rectum. MAKE SURE YOU:   Understand these instructions.  Will watch your condition.  Will get help right away if you are not doing well or get worse. Document Released: 07/23/2005 Document Revised: 10/15/2011 Document Reviewed: 02/21/2007 [facility] Patient Information 2014 [place].  Cough, Adult  A cough is a reflex that helps clear your throat and airways. It can help heal the body or may be a reaction to an irritated airway. A cough may only last 2 or 3 weeks (acute) or may last more than 8 weeks (chronic).  CAUSES Acute cough:  Viral or bacterial infections. Chronic cough:  Infections.  Allergies.  Asthma.  Post-nasal drip.  Smoking.  Heartburn or acid reflux.  Some medicines.  Chronic lung problems (COPD).  Cancer. SYMPTOMS   Cough.  Fever.  Chest pain.  Increased breathing rate.  High-pitched whistling sound when  breathing (wheezing).  Colored mucus that you cough up (sputum). TREATMENT   A bacterial cough may be treated with antibiotic medicine.  A viral cough must run its course and will not respond to antibiotics.  Your caregiver may recommend other treatments if you have a chronic cough. HOME CARE INSTRUCTIONS   Only take over-the-counter or prescription medicines for pain, discomfort, or fever as directed by your caregiver. Use cough suppressants only as directed by your caregiver.  Use a cold steam vaporizer or humidifier in your bedroom or home to help loosen secretions.  Sleep in a semi-upright position if your cough is worse at night.  Rest as needed.  Stop smoking if you smoke. SEEK IMMEDIATE MEDICAL CARE IF:   You have pus in your sputum.  Your cough starts to worsen.  You cannot control your cough with suppressants and are losing sleep.  You begin coughing up blood.  You have difficulty breathing.  You develop pain which is getting worse or is uncontrolled with medicine.  You have a fever. MAKE SURE YOU:   Understand these instructions.  Will watch your condition.  Will get help right away if you are not doing well or get worse. Document Released: 01/19/2011 Document Revised: 10/15/2011 Document Reviewed: 01/19/2011 [facility] Patient Information 2014 [place].

## 2013-10-14 LAB — TSH: TSH: 0.806 u[IU]/mL (ref 0.350–4.500)

## 2013-10-14 LAB — BRAIN NATRIURETIC PEPTIDE: Brain Natriuretic Peptide: 5.1 pg/mL (ref 0.0–100.0)

## 2013-10-15 ENCOUNTER — Telehealth: Payer: Self-pay | Admitting: Gastroenterology

## 2013-10-15 NOTE — Telephone Encounter (Signed)
Pts husband states she is starting to have difficulty swallowing. Pt scheduled to see Dr. Deatra Ina 10/21/13@9 :15am. Pt aware of appt.

## 2013-10-21 ENCOUNTER — Ambulatory Visit (INDEPENDENT_AMBULATORY_CARE_PROVIDER_SITE_OTHER): Payer: Medicare Other | Admitting: Gastroenterology

## 2013-10-21 ENCOUNTER — Encounter: Payer: Self-pay | Admitting: Gastroenterology

## 2013-10-21 ENCOUNTER — Other Ambulatory Visit: Payer: Medicare Other

## 2013-10-21 ENCOUNTER — Ambulatory Visit
Admission: RE | Admit: 2013-10-21 | Discharge: 2013-10-21 | Disposition: A | Discharge status: Home / Self Care | Payer: Medicare Other | Source: Ambulatory Visit | Attending: Physician Assistant | Admitting: Physician Assistant

## 2013-10-21 VITALS — BP 120/60 | HR 70 | Ht 62.0 in | Wt 145.8 lb

## 2013-10-21 DIAGNOSIS — K7460 Unspecified cirrhosis of liver: Secondary | ICD-10-CM

## 2013-10-21 DIAGNOSIS — D126 Benign neoplasm of colon, unspecified: Secondary | ICD-10-CM

## 2013-10-21 DIAGNOSIS — R1310 Dysphagia, unspecified: Secondary | ICD-10-CM

## 2013-10-21 NOTE — Progress Notes (Signed)
          History of Present Illness:  The patient has returned complaining of dysphagia to solids.  She denies odynophagia or pyrosis.  She has cryptogenic cirrhosis and grade 1 varices as determined by endoscopy in 2013.  Recent ultrasound was negative for ascites.    Review of Systems: She has poor balance Pertinent positive and negative review of systems were noted in the above HPI section. All other review of systems were otherwise negative.    Current Medications, Allergies, Past Medical History, Past Surgical History, Family History and Social History were reviewed in [facility] record  Vital signs were reviewed in today's medical record. Physical Exam: General: Well developed , well nourished, no acute distress Skin: anicteric Head: Normocephalic and atraumatic Eyes:  sclerae anicteric, EOMI Ears: Normal auditory acuity Mouth: No deformity or lesions Lungs: Clear throughout to auscultation Heart: Regular rate and rhythm; no murmurs, rubs or bruits Abdomen: Soft, non tender and non distended. No masses, hepatosplenomegaly or hernias noted. Normal Bowel sounds Rectal:deferred Musculoskeletal: Symmetrical with no gross deformities  Pulses:  Normal pulses noted Extremities: No clubbing, cyanosis, edema or deformities noted Neurological: Alert oriented x 4, grossly nonfocal Psychological:  Alert and cooperative. Normal mood and affect  See Assessment and Plan under Problem List

## 2013-10-21 NOTE — Assessment & Plan Note (Signed)
Plan followup colonoscopy

## 2013-10-21 NOTE — Patient Instructions (Addendum)
Go to the basement for labs today  You have been scheduled for an endoscopy with propofol. Please follow written instructions given to you at your visit today. If you use inhalers (even only as needed), please bring them with you on the day of your procedure. Your physician has requested that you go to www.startemmi.com and enter the access code given to you at your visit today. This web site gives a general overview about your procedure. However, you should still follow specific instructions given to you by our office regarding your preparation for the procedure.   It has been recommended to you by your physician that you have a(n) Colonoscopy completed. Per your request, we did not schedule the procedure(s) today. Please contact our office at [phone] should you decide to have the procedure completed.

## 2013-10-21 NOTE — Assessment & Plan Note (Signed)
Dysphagia to solids-rule out peptic stricture  Recommendations #1 upper endoscopy and dilatation as indicated unless she has large varices that could obscure a dilatation therapy

## 2013-10-21 NOTE — Assessment & Plan Note (Addendum)
Patient has cryptogenic cirrhosis with portal hypertension and grade 1 varices. Worsening splenomegaly suggests that portal hypertension is worsening.  Note patient is on Aldactone and Lasix but has no ascites by ultrasound.  Recommendations #1 followup endoscopy #2 check serologies for hepatitis A, B, and C and vaccinate accordingly

## 2013-10-22 ENCOUNTER — Encounter: Payer: Self-pay | Admitting: Gastroenterology

## 2013-10-22 ENCOUNTER — Other Ambulatory Visit: Payer: Self-pay | Admitting: Gastroenterology

## 2013-10-22 ENCOUNTER — Other Ambulatory Visit: Payer: Self-pay

## 2013-10-22 ENCOUNTER — Other Ambulatory Visit: Payer: Self-pay | Admitting: Internal Medicine

## 2013-10-22 ENCOUNTER — Ambulatory Visit (AMBULATORY_SURGERY_CENTER): Payer: Medicare Other | Admitting: Gastroenterology

## 2013-10-22 VITALS — BP 135/65 | HR 77 | Temp 98.0°F | Resp 15 | Ht 62.0 in | Wt 145.0 lb

## 2013-10-22 DIAGNOSIS — R109 Unspecified abdominal pain: Secondary | ICD-10-CM

## 2013-10-22 DIAGNOSIS — I8500 Esophageal varices without bleeding: Secondary | ICD-10-CM

## 2013-10-22 DIAGNOSIS — K2970 Gastritis, unspecified, without bleeding: Secondary | ICD-10-CM

## 2013-10-22 DIAGNOSIS — R1310 Dysphagia, unspecified: Secondary | ICD-10-CM

## 2013-10-22 DIAGNOSIS — K2990 Gastroduodenitis, unspecified, without bleeding: Secondary | ICD-10-CM

## 2013-10-22 DIAGNOSIS — K2940 Chronic atrophic gastritis without bleeding: Secondary | ICD-10-CM

## 2013-10-22 LAB — GLUCOSE, CAPILLARY
GLUCOSE-CAPILLARY: 83 mg/dL (ref 70–99)
Glucose-Capillary: 69 mg/dL — ABNORMAL LOW (ref 70–99)
Glucose-Capillary: 175 mg/dL — ABNORMAL HIGH (ref 70–99)

## 2013-10-22 LAB — HEPATITIS C ANTIBODY: HCV AB: NEGATIVE

## 2013-10-22 LAB — HEPATITIS B SURFACE ANTIBODY,QUALITATIVE: Hep B S Ab: NEGATIVE

## 2013-10-22 LAB — HEPATITIS A ANTIBODY, TOTAL: Hep A Total Ab: NONREACTIVE

## 2013-10-22 LAB — HEPATITIS B SURFACE ANTIGEN: Hepatitis B Surface Ag: NEGATIVE

## 2013-10-22 MED ORDER — NADOLOL 40 MG PO TABS: 40.0000 mg | ORAL_TABLET | Freq: Every day | ORAL | Status: DC

## 2013-10-22 MED ORDER — DEXTROSE 5 % IV SOLN: INTRAVENOUS | Status: DC

## 2013-10-22 NOTE — Progress Notes (Signed)
Stable to RR 

## 2013-10-22 NOTE — Op Note (Signed)
[facility]  [address] [place], [id number]   ENDOSCOPY PROCEDURE REPORT  PATIENT: [name], [name]  MR#: [id number] BIRTHDATE: 03-Nov-1945 , 9  yrs. old GENDER: Female ENDOSCOPIST: Inda Castle, MD REFERRED BY:  Unk Pinto, M.D. PROCEDURE DATE:  10/22/2013 PROCEDURE:  EGD w/ biopsy , EGD with balloon dilation ASA CLASS:     Class III INDICATIONS:  Dysphagia.   Screening for varices. MEDICATIONS: MAC sedation, administered by CRNA, Propofol (Diprivan) 70 mg IV, and Simethicone 0.6cc PO TOPICAL ANESTHETIC:  DESCRIPTION OF PROCEDURE: After the risks benefits and alternatives of the procedure were thoroughly explained, informed consent was obtained.  The LB [id number] endoscope was introduced through the mouth and advanced to the third portion of the duodenum. Without limitations.  The instrument was slowly withdrawn as the mucosa was fully examined.      Throughout the gastric fundus body and antrum there was atrophic appearing mucosa.  Biopsies were taken. No frank esophageal stricture was identified. There were grade 2-3 varices in the distal one third of the esophagus.`.   The remainder of the upper endoscopy exam was otherwise normal.  Retroflexed views revealed no abnormalities. The scope was then withdrawn from the patient and the procedure completed.  Because of complaints of dysphagia a #18 mm balloon dilator was inflated across the GE junction.  There was mild resistance.  There was no heme.  COMPLICATIONS: There were no complications. ENDOSCOPIC IMPRESSION: 1. gastritis 2.  esophageal varices 3.  dysphagia without apparent stricture-status post balloon dilation  RECOMMENDATIONS: 1.  begin nadolol 40 mg daily 2.  office visit six-months REPEAT EXAM: one year eSigned:  Inda Castle, MD 10/22/2013 3:34 PM   CC:  PATIENT NAME:  [name], [name] MR#: [id number]

## 2013-10-22 NOTE — Patient Instructions (Signed)
YOU HAD AN ENDOSCOPIC PROCEDURE TODAY AT [facility]: Refer to the procedure report that was given to you for any specific questions about what was found during the examination.  If the procedure report does not answer your questions, please call your gastroenterologist to clarify.  If you requested that your care partner not be given the details of your procedure findings, then the procedure report has been included in a sealed envelope for you to review at your convenience later.  YOU SHOULD EXPECT: Some feelings of bloating in the abdomen. Passage of more gas than usual.  Walking can help get rid of the air that was put into your GI tract during the procedure and reduce the bloating. If you had a lower endoscopy (such as a colonoscopy or flexible sigmoidoscopy) you may notice spotting of blood in your stool or on the toilet paper. If you underwent a bowel prep for your procedure, then you may not have a normal bowel movement for a few days.  DIET:   Nothing by mouth until 4:30pm water.  If water is tolerated, you may go to a soft diet for the rest of the night.  See your sheets that I gave you.   No alcohol for 24 hours.  ACTIVITY: Your care partner should take you home directly after the procedure.  You should plan to take it easy, moving slowly for the rest of the day.  You can resume normal activity the day after the procedure however you should NOT DRIVE or use heavy machinery for 24 hours (because of the sedation medicines used during the test).    SYMPTOMS TO REPORT IMMEDIATELY: A gastroenterologist can be reached at any hour.  During normal business hours, 8:30 AM to 5:00 PM Monday through Friday, call [phone].  After hours and on weekends, please call the GI answering service at [phone] who will take a message and have the physician on call contact you.   Following upper endoscopy (EGD)  Vomiting of blood or coffee ground material  New chest pain or pain under  the shoulder blades  Painful or persistently difficult swallowing  New shortness of breath  Fever of 100F or higher  Black, tarry-looking stools  FOLLOW UP: If any biopsies were taken you will be contacted by phone or by letter within the next 1-3 weeks.  Call your gastroenterologist if you have not heard about the biopsies in 3 weeks.  Our staff will call the home number listed on your records the next business day following your procedure to check on you and address any questions or concerns that you may have at that time regarding the information given to you following your procedure. This is a courtesy call and so if there is no answer at the home number and we have not heard from you through the emergency physician on call, we will assume that you have returned to your regular daily activities without incident.  Begin Nadolol 68m tonight as directed by Dr. KDeatra Ina  Schedule an office visit for 6 mos.  SIGNATURES/CONFIDENTIALITY: You and/or your care partner have signed paperwork which will be entered into your electronic medical record.  These signatures attest to the fact that that the information above on your After Visit Summary has been reviewed and is understood.  Full responsibility of the confidentiality of this discharge information lies with you and/or your care-partner.

## 2013-10-22 NOTE — Progress Notes (Signed)
Called to room to assist during endoscopic procedure.  Patient ID and intended procedure confirmed with present staff. Received instructions for my participation in the procedure from the performing physician.  

## 2013-10-23 ENCOUNTER — Telehealth: Payer: Self-pay

## 2013-10-23 NOTE — Telephone Encounter (Signed)
  Follow up Call-  Call back number 10/22/2013  Post procedure Call Back phone  # [phone]  Permission to leave phone message Yes     Patient questions:  Do you have a fever, pain , or abdominal swelling? no Pain Score  0 *  Have you tolerated food without any problems? yes  Have you been able to return to your normal activities? yes  Do you have any questions about your discharge instructions: Diet   no Medications  no Follow up visit  no  Do you have questions or concerns about your Care? no  Actions: * If pain score is 4 or above: No action needed, pain <4.

## 2013-10-29 ENCOUNTER — Ambulatory Visit (INDEPENDENT_AMBULATORY_CARE_PROVIDER_SITE_OTHER): Payer: Medicare Other | Admitting: Emergency Medicine

## 2013-10-29 VITALS — BP 108/60 | HR 68 | Temp 99.1°F | Resp 18 | Wt 145.6 lb

## 2013-10-29 DIAGNOSIS — J189 Pneumonia, unspecified organism: Secondary | ICD-10-CM

## 2013-10-29 MED ORDER — DOXYCYCLINE HYCLATE 100 MG PO TABS: 100.0000 mg | ORAL_TABLET | Freq: Two times a day (BID) | ORAL | Status: DC

## 2013-10-29 MED ORDER — BENZONATATE 100 MG PO CAPS: 100.0000 mg | ORAL_CAPSULE | Freq: Three times a day (TID) | ORAL | Status: DC | PRN

## 2013-10-29 MED ORDER — PREDNISONE 10 MG PO TABS: ORAL_TABLET | ORAL | Status: DC

## 2013-10-29 MED ORDER — CEFTRIAXONE SODIUM 1 G IJ SOLR
1.0000 g | Freq: Once | INTRAMUSCULAR | Status: AC
  Administered 2013-10-29: 1 g via INTRAMUSCULAR

## 2013-10-29 NOTE — Patient Instructions (Signed)
Dehydration, Adult Dehydration means your body does not have as much fluid as it needs. Your kidneys, brain, and heart will not work properly without the right amount of fluids and salt.  HOME CARE  Ask your doctor how to replace body fluid losses (rehydrate).  Drink enough fluids to keep your pee (urine) clear or pale yellow.  Drink small amounts of fluids often if you feel sick to your stomach (nauseous) or throw up (vomit).  Eat like you normally do.  Avoid:  Foods or drinks high in sugar.  Bubbly (carbonated) drinks.  Juice.  Very hot or cold fluids.  Drinks with caffeine.  Fatty, greasy foods.  Alcohol.  Tobacco.  Eating too much.  Gelatin desserts.  Wash your hands to avoid spreading germs (bacteria, viruses).  Only take medicine as told by your doctor.  Keep all doctor visits as told. GET HELP RIGHT AWAY IF:   You cannot drink something without throwing up.  You get worse even with treatment.  Your vomit has blood in it or looks greenish.  Your poop (stool) has blood in it or looks black and tarry.  You have not peed in 6 to 8 hours.  You pee a small amount of very dark pee.  You have a fever.  You pass out (faint).  You have belly (abdominal) pain that gets worse or stays in one spot (localizes).  You have a rash, stiff neck, or bad headache.  You get easily annoyed, sleepy, or are hard to wake up.  You feel weak, dizzy, or very thirsty. MAKE SURE YOU:   Understand these instructions.  Will watch your condition.  Will get help right away if you are not doing well or get worse. Document Released: 05/19/2009 Document Revised: 10/15/2011 Document Reviewed: 03/12/2011 [facility] Patient Information 2014 [place], [place]. Pneumonia, Adult Pneumonia is an infection of the lungs. It may be caused by a germ (virus or bacteria). Some types of pneumonia can spread easily from person to person. This can happen when you cough or sneeze. HOME  CARE  Only take medicine as told by your doctor.  Take your medicine (antibiotics) as told. Finish it even if you start to feel better.  Do not smoke.  You may use a vaporizer or humidifier in your room. This can help loosen thick spit (mucus).  Sleep so you are almost sitting up (semi-upright). This helps reduce coughing.  Rest. A shot (vaccine) can help prevent pneumonia. Shots are often advised for:  People over 79 years old.  Patients on chemotherapy.  People with long-term (chronic) lung problems.  People with immune system problems. GET HELP RIGHT AWAY IF:   You are getting worse.  You cannot control your cough, and you are losing sleep.  You cough up blood.  Your pain gets worse, even with medicine.  You have a fever.  Any of your problems are getting worse, not better.  You have shortness of breath or chest pain. MAKE SURE YOU:   Understand these instructions.  Will watch your condition.  Will get help right away if you are not doing well or get worse. Document Released: 01/09/2008 Document Revised: 10/15/2011 Document Reviewed: 10/13/2010 [facility] Patient Information 2014 [place].

## 2013-10-29 NOTE — Progress Notes (Signed)
Subjective:    Patient ID: [name], female    DOB: June 29, 1946, 68 y.o.   MRN: [id number]  HPI Comments: 68 yo female with increased cough since endoscopy. She notes production this morning changed and increased spasms with cough. She is feeling weaker. She notes increased fatigued. She hits nebs 1 x a day without relief. Spouse notes rattling when she breathes. She has not taking any OTC for relief.   She had noticed before illness she was feeling more fatigued. She notes BS has been running low and concerned this may be contributing to fatigue.   Cough Associated symptoms include a fever.  Sore Throat  Associated symptoms include coughing.  Fever  Associated symptoms include coughing.      Medication List       This list is accurate as of: 10/29/13  3:56 PM.  Always use your most recent med list.               acetaminophen-codeine 300-30 MG per tablet  Commonly known as:  TYLENOL #3  Take by mouth every 4 (four) hours as needed for moderate pain.     amitriptyline 10 MG tablet  Commonly known as:  ELAVIL  Take 10 mg by mouth at bedtime.     aspirin 81 MG tablet  Take 81 mg by mouth daily.     citalopram 40 MG tablet  Commonly known as:  CELEXA  TAKE 1 TABLET BY MOUTH EVERY DAY FOR MOOD     cyclobenzaprine 10 MG tablet  Commonly known as:  FLEXERIL  Take 10 mg by mouth 3 (three) times daily as needed for muscle spasms.     ferrous sulfate 325 (65 FE) MG tablet  TAKE 1 TABLET TWICE DAILY     Fish Oil 1000 MG Caps  Take 1,000 mg by mouth 2 (two) times daily.     furosemide 40 MG tablet  Commonly known as:  LASIX  Take 1 tablet (40 mg total) by mouth daily.     gabapentin 300 MG capsule  Commonly known as:  NEURONTIN  Take 300 mg by mouth 3 (three) times daily.     Magnesium 250 MG Tabs  Take 250 mg by mouth daily at 6 (six) AM.     metFORMIN 500 MG 24 hr tablet  Commonly known as:  GLUCOPHAGE-XR  Take 500 mg by mouth 4 (four) times daily.     nadolol 40 MG tablet  Commonly known as:  CORGARD  Take 1 tablet (40 mg total) by mouth daily.     OVER THE COUNTER MEDICATION  100 mg as needed. Stool softner     OVER THE COUNTER MEDICATION  as needed. Natural Veg Laxative     pantoprazole 40 MG tablet  Commonly known as:  PROTONIX     promethazine-codeine 6.25-10 MG/5ML syrup  Commonly known as:  PHENERGAN with CODEINE  Take 5 mLs by mouth every 6 (six) hours as needed for cough.     spironolactone 25 MG tablet  Commonly known as:  ALDACTONE  TAKE 1 TABLET EVERY MORNING (STOP FUROSEMIDE AND METAXOLONE)     tamoxifen 20 MG tablet  Commonly known as:  NOLVADEX  Take 20 mg by mouth daily.     Vitamin D-3 1000 UNITS Caps  Take 5,000 Units by mouth daily.          Review of Systems  Constitutional: Positive for fever and fatigue.  Respiratory: Positive for cough.   All other  systems reviewed and are negative.   BP 108/60  Pulse 68  Temp(Src) 99.1 F (37.3 C)  Resp 18  Wt 145 lb 9.6 oz (66.044 kg)  SpO2 96%      Objective:   Physical Exam  Nursing note and vitals reviewed. Constitutional: She is oriented to person, place, and time. She appears well-developed and well-nourished.  HENT:  Head: Normocephalic and atraumatic.  Right Ear: External ear normal.  Left Ear: External ear normal.  Nose: Nose normal.  Mouth/Throat: Oropharynx is clear and moist. No oropharyngeal exudate.  Eyes: Conjunctivae and EOM are normal.  Neck: Normal range of motion.  Cardiovascular: Normal rate, regular rhythm, normal heart sounds and intact distal pulses.   Pulmonary/Chest: Effort normal.  ? Rhonchi lower lobes  Musculoskeletal: Normal range of motion.  Lymphadenopathy:    She has no cervical adenopathy.  Neurological: She is alert and oriented to person, place, and time.  Skin: Skin is warm and dry.  Psychiatric: She has a normal mood and affect. Judgment normal.          Assessment & Plan:  1. Hypoglycemia vs  Fatigue- increase activity and H2O. Decrease Metformin to 1 BID with more low BS episodes and A1c 5.2 and D/C fluid pill with Bp LOW.  2. Pneumonia- Rocephin 1 gm, Doxy 100 1 po BID, Tessalon perles 100 mg AD, Nebs TID, if no improvement start pred dp 10 mg, w/c if SX increase or ER. Push fluids.

## 2013-10-30 ENCOUNTER — Ambulatory Visit (HOSPITAL_COMMUNITY)
Admission: RE | Admit: 2013-10-30 | Discharge: 2013-10-30 | Disposition: A | Discharge status: Home / Self Care | Payer: Medicare Other | Source: Ambulatory Visit | Attending: Emergency Medicine | Admitting: Emergency Medicine

## 2013-10-30 DIAGNOSIS — R05 Cough: Secondary | ICD-10-CM | POA: Insufficient documentation

## 2013-10-30 DIAGNOSIS — Z981 Arthrodesis status: Secondary | ICD-10-CM | POA: Insufficient documentation

## 2013-10-30 DIAGNOSIS — Z96619 Presence of unspecified artificial shoulder joint: Secondary | ICD-10-CM | POA: Insufficient documentation

## 2013-10-30 DIAGNOSIS — R059 Cough, unspecified: Secondary | ICD-10-CM | POA: Insufficient documentation

## 2013-10-30 DIAGNOSIS — J189 Pneumonia, unspecified organism: Secondary | ICD-10-CM | POA: Insufficient documentation

## 2013-11-02 ENCOUNTER — Encounter: Payer: Self-pay | Admitting: Emergency Medicine

## 2013-11-02 ENCOUNTER — Encounter: Payer: Self-pay | Admitting: Gastroenterology

## 2013-11-02 ENCOUNTER — Other Ambulatory Visit: Payer: Self-pay | Admitting: Internal Medicine

## 2013-11-02 ENCOUNTER — Other Ambulatory Visit: Payer: Self-pay | Admitting: Emergency Medicine

## 2013-11-02 DIAGNOSIS — J189 Pneumonia, unspecified organism: Secondary | ICD-10-CM

## 2013-11-03 ENCOUNTER — Other Ambulatory Visit: Payer: Self-pay

## 2013-11-03 DIAGNOSIS — K7460 Unspecified cirrhosis of liver: Secondary | ICD-10-CM

## 2013-11-05 ENCOUNTER — Ambulatory Visit (INDEPENDENT_AMBULATORY_CARE_PROVIDER_SITE_OTHER): Payer: Medicare Other | Admitting: Gastroenterology

## 2013-11-05 DIAGNOSIS — Z23 Encounter for immunization: Secondary | ICD-10-CM

## 2013-11-05 DIAGNOSIS — K7460 Unspecified cirrhosis of liver: Secondary | ICD-10-CM

## 2013-11-12 ENCOUNTER — Ambulatory Visit (INDEPENDENT_AMBULATORY_CARE_PROVIDER_SITE_OTHER): Payer: Medicare Other | Admitting: Gastroenterology

## 2013-11-12 DIAGNOSIS — Z23 Encounter for immunization: Secondary | ICD-10-CM

## 2013-11-12 DIAGNOSIS — K7460 Unspecified cirrhosis of liver: Secondary | ICD-10-CM

## 2013-11-19 ENCOUNTER — Encounter: Payer: Self-pay | Admitting: Physician Assistant

## 2013-11-26 ENCOUNTER — Other Ambulatory Visit: Payer: Self-pay | Admitting: Emergency Medicine

## 2013-11-26 ENCOUNTER — Encounter: Payer: Self-pay | Admitting: Gastroenterology

## 2013-11-27 ENCOUNTER — Encounter: Payer: Self-pay | Admitting: Physician Assistant

## 2013-11-29 DIAGNOSIS — K2100 Gastro-esophageal reflux disease with esophagitis, without bleeding: Secondary | ICD-10-CM | POA: Insufficient documentation

## 2013-11-29 DIAGNOSIS — M797 Fibromyalgia: Secondary | ICD-10-CM | POA: Insufficient documentation

## 2013-11-29 DIAGNOSIS — I10 Essential (primary) hypertension: Secondary | ICD-10-CM | POA: Insufficient documentation

## 2013-11-29 DIAGNOSIS — E785 Hyperlipidemia, unspecified: Secondary | ICD-10-CM | POA: Insufficient documentation

## 2013-11-29 DIAGNOSIS — K589 Irritable bowel syndrome without diarrhea: Secondary | ICD-10-CM | POA: Insufficient documentation

## 2013-11-29 DIAGNOSIS — E559 Vitamin D deficiency, unspecified: Secondary | ICD-10-CM | POA: Insufficient documentation

## 2013-11-30 ENCOUNTER — Other Ambulatory Visit: Payer: Self-pay | Admitting: Internal Medicine

## 2013-12-01 ENCOUNTER — Other Ambulatory Visit: Payer: Self-pay | Admitting: Physician Assistant

## 2013-12-01 ENCOUNTER — Encounter: Payer: Self-pay | Admitting: Physician Assistant

## 2013-12-01 ENCOUNTER — Ambulatory Visit (HOSPITAL_COMMUNITY)
Admission: RE | Admit: 2013-12-01 | Discharge: 2013-12-01 | Disposition: A | Discharge status: Home / Self Care | Payer: Medicare Other | Source: Ambulatory Visit | Attending: Emergency Medicine | Admitting: Emergency Medicine

## 2013-12-01 ENCOUNTER — Ambulatory Visit (INDEPENDENT_AMBULATORY_CARE_PROVIDER_SITE_OTHER): Payer: Medicare Other | Admitting: Physician Assistant

## 2013-12-01 VITALS — BP 128/68 | HR 72 | Temp 97.9°F | Resp 16 | Ht 62.5 in | Wt 143.0 lb

## 2013-12-01 DIAGNOSIS — E119 Type 2 diabetes mellitus without complications: Secondary | ICD-10-CM

## 2013-12-01 DIAGNOSIS — R0902 Hypoxemia: Secondary | ICD-10-CM

## 2013-12-01 DIAGNOSIS — Z79899 Other long term (current) drug therapy: Secondary | ICD-10-CM

## 2013-12-01 DIAGNOSIS — R0600 Dyspnea, unspecified: Secondary | ICD-10-CM

## 2013-12-01 DIAGNOSIS — E559 Vitamin D deficiency, unspecified: Secondary | ICD-10-CM

## 2013-12-01 DIAGNOSIS — J449 Chronic obstructive pulmonary disease, unspecified: Secondary | ICD-10-CM

## 2013-12-01 DIAGNOSIS — E785 Hyperlipidemia, unspecified: Secondary | ICD-10-CM

## 2013-12-01 DIAGNOSIS — Z0000 Encounter for general adult medical examination without abnormal findings: Secondary | ICD-10-CM

## 2013-12-01 DIAGNOSIS — J4489 Other specified chronic obstructive pulmonary disease: Secondary | ICD-10-CM | POA: Insufficient documentation

## 2013-12-01 DIAGNOSIS — I10 Essential (primary) hypertension: Secondary | ICD-10-CM

## 2013-12-01 DIAGNOSIS — J189 Pneumonia, unspecified organism: Secondary | ICD-10-CM | POA: Insufficient documentation

## 2013-12-01 LAB — HEPATIC FUNCTION PANEL
ALK PHOS: 100 U/L (ref 39–117)
ALT: 21 U/L (ref 0–35)
AST: 21 U/L (ref 0–37)
Albumin: 3.9 g/dL (ref 3.5–5.2)
Bilirubin, Direct: 0.1 mg/dL (ref 0.0–0.3)
Indirect Bilirubin: 0.4 mg/dL (ref 0.2–1.2)
Total Bilirubin: 0.5 mg/dL (ref 0.2–1.2)
Total Protein: 5.8 g/dL — ABNORMAL LOW (ref 6.0–8.3)

## 2013-12-01 LAB — LIPID PANEL
Cholesterol: 206 mg/dL — ABNORMAL HIGH (ref 0–200)
HDL: 37 mg/dL — ABNORMAL LOW (ref >39)
LDL Cholesterol: 104 mg/dL — ABNORMAL HIGH (ref 0–99)
Total CHOL/HDL Ratio: 5.6 Ratio
Triglycerides: 323 mg/dL — ABNORMAL HIGH (ref <150)
VLDL: 65 mg/dL — ABNORMAL HIGH (ref 0–40)

## 2013-12-01 LAB — MAGNESIUM: Magnesium: 1.8 mg/dL (ref 1.5–2.5)

## 2013-12-01 LAB — BASIC METABOLIC PANEL WITH GFR
BUN: 12 mg/dL (ref 6–23)
CALCIUM: 9.1 mg/dL (ref 8.4–10.5)
CHLORIDE: 104 meq/L (ref 96–112)
CO2: 31 meq/L (ref 19–32)
Creat: 0.72 mg/dL (ref 0.50–1.10)
GFR, Est African American: >89 mL/min
GFR, Est Non African American: 87 mL/min
Glucose, Bld: 81 mg/dL (ref 70–99)
Potassium: 4.4 mEq/L (ref 3.5–5.3)
Sodium: 142 mEq/L (ref 135–145)

## 2013-12-01 MED ORDER — ARFORMOTEROL TARTRATE 15 MCG/2ML IN NEBU: 15.0000 ug | INHALATION_SOLUTION | Freq: Every morning | RESPIRATORY_TRACT | Status: DC

## 2013-12-01 MED ORDER — LEVOFLOXACIN 500 MG PO TABS: 500.0000 mg | ORAL_TABLET | Freq: Every day | ORAL | Status: AC

## 2013-12-01 NOTE — Patient Instructions (Signed)
Preventative Care for Adults - Female      MAINTAIN REGULAR HEALTH EXAMS:  A routine yearly physical is a good way to check in with your primary care provider about your health and preventive screening. It is also an opportunity to share updates about your health and any concerns you have, and receive a thorough all-over exam.   Most health insurance companies pay for at least some preventative services.  Check with your health plan for specific coverages.  WHAT PREVENTATIVE SERVICES DO WOMEN NEED?  Adult women should have their weight and blood pressure checked regularly.   Women age 41 and older should have their cholesterol levels checked regularly.  Women should be screened for cervical cancer with a Pap smear and pelvic exam beginning at either age 22, or 3 years after they become sexually activity.    Breast cancer screening generally begins at age 18 with a mammogram and breast exam by your primary care provider.    Beginning at age 6 and continuing to age 61, women should be screened for colorectal cancer.  Certain people may need continued testing until age 67.  Updating vaccinations is part of preventative care.  Vaccinations help protect against diseases such as the flu.  Osteoporosis is a disease in which the bones lose minerals and strength as we age. Women ages 14 and over should discuss this with their caregivers, as should women after menopause who have other risk factors.  Lab tests are generally done as part of preventative care to screen for anemia and blood disorders, to screen for problems with the kidneys and liver, to screen for bladder problems, to check blood sugar, and to check your cholesterol level.  Preventative services generally include counseling about diet, exercise, avoiding tobacco, drugs, excessive alcohol consumption, and sexually transmitted infections.    GENERAL RECOMMENDATIONS FOR GOOD HEALTH:  Healthy diet:  Eat a variety of foods, including  fruit, vegetables, animal or vegetable protein, such as meat, fish, chicken, and eggs, or beans, lentils, tofu, and grains, such as rice.  Drink plenty of water daily.  Decrease saturated fat in the diet, avoid lots of red meat, processed foods, sweets, fast foods, and fried foods.  Exercise:  Aerobic exercise helps maintain good heart health. At least 30-40 minutes of moderate-intensity exercise is recommended. For example, a brisk walk that increases your heart rate and breathing. This should be done on most days of the week.   Find a type of exercise or a variety of exercises that you enjoy so that it becomes a part of your daily life.  Examples are running, walking, swimming, water aerobics, and biking.  For motivation and support, explore group exercise such as aerobic class, spin class, Zumba, Yoga,or  martial arts, etc.    Set exercise goals for yourself, such as a certain weight goal, walk or run in a race such as a 5k walk/run.  Speak to your primary care provider about exercise goals.  Disease prevention:  If you smoke or chew tobacco, find out from your caregiver how to quit. It can literally save your life, no matter how long you have been a tobacco user. If you do not use tobacco, never begin.   Maintain a healthy diet and normal weight. Increased weight leads to problems with blood pressure and diabetes.   The Body Mass Index or BMI is a way of measuring how much of your body is fat. Having a BMI above 27 increases the risk of heart disease,  diabetes, hypertension, stroke and other problems related to obesity. Your caregiver can help determine your BMI and based on it develop an exercise and dietary program to help you achieve or maintain this important measurement at a healthful level.  High blood pressure causes heart and blood vessel problems.  Persistent high blood pressure should be treated with medicine if weight loss and exercise do not work.   Fat and cholesterol leaves  deposits in your arteries that can block them. This causes heart disease and vessel disease elsewhere in your body.  If your cholesterol is found to be high, or if you have heart disease or certain other medical conditions, then you may need to have your cholesterol monitored frequently and be treated with medication.   Ask if you should have a cardiac stress test if your history suggests this. A stress test is a test done on a treadmill that looks for heart disease. This test can find disease prior to there being a problem.  Menopause can be associated with physical symptoms and risks. Hormone replacement therapy is available to decrease these. You should talk to your caregiver about whether starting or continuing to take hormones is right for you.   Osteoporosis is a disease in which the bones lose minerals and strength as we age. This can result in serious bone fractures. Risk of osteoporosis can be identified using a bone density scan. Women ages 35 and over should discuss this with their caregivers, as should women after menopause who have other risk factors. Ask your caregiver whether you should be taking a calcium supplement and Vitamin D, to reduce the rate of osteoporosis.   Avoid drinking alcohol in excess (more than two drinks per day).  Avoid use of street drugs. Do not share needles with anyone. Ask for professional help if you need assistance or instructions on stopping the use of alcohol, cigarettes, and/or drugs.  Brush your teeth twice a day with fluoride toothpaste, and floss once a day. Good oral hygiene prevents tooth decay and gum disease. The problems can be painful, unattractive, and can cause other health problems. Visit your dentist for a routine oral and dental check up and preventive care every 6-12 months.   Look at your skin regularly.  Use a mirror to look at your back. Notify your caregivers of changes in moles, especially if there are changes in shapes, colors, a size  larger than a pencil eraser, an irregular border, or development of new moles.  Safety:  Use seatbelts 100% of the time, whether driving or as a passenger.  Use safety devices such as hearing protection if you work in environments with loud noise or significant background noise.  Use safety glasses when doing any work that could send debris in to the eyes.  Use a helmet if you ride a bike or motorcycle.  Use appropriate safety gear for contact sports.  Talk to your caregiver about gun safety.  Use sunscreen with a SPF (or skin protection factor) of 15 or greater.  Lighter skinned people are at a greater risk of skin cancer. Don't forget to also wear sunglasses in order to protect your eyes from too much damaging sunlight. Damaging sunlight can accelerate cataract formation.   Practice safe sex. Use condoms. Condoms are used for birth control and to help reduce the spread of sexually transmitted infections (or STIs).  Some of the STIs are gonorrhea (the clap), chlamydia, syphilis, trichomonas, herpes, HPV (human papilloma virus) and HIV (human immunodeficiency virus)  which causes AIDS. The herpes, HIV and HPV are viral illnesses that have no cure. These can result in disability, cancer and death.   Keep carbon monoxide and smoke detectors in your home functioning at all times. Change the batteries every 6 months or use a model that plugs into the wall.   Vaccinations:  Stay up to date with your tetanus shots and other required immunizations. You should have a booster for tetanus every 10 years. Be sure to get your flu shot every year, since 5%-20% of the [place] population comes down with the flu. The flu vaccine changes each year, so being vaccinated once is not enough. Get your shot in the fall, before the flu season peaks.   Other vaccines to consider:  Human Papilloma Virus or HPV causes cancer of the cervix, and other infections that can be transmitted from person to person. There is a vaccine for  HPV, and females should get immunized between the ages of 61 and 70. It requires a series of 3 shots.   Pneumococcal vaccine to protect against certain types of pneumonia.  This is normally recommended for adults age 12 or older.  However, adults younger than 67 years old with certain underlying conditions such as diabetes, heart or lung disease should also receive the vaccine.  Shingles vaccine to protect against Varicella Zoster if you are older than age 46, or younger than 67 years old with certain underlying illness.  Hepatitis A vaccine to protect against a form of infection of the liver by a virus acquired from food.  Hepatitis B vaccine to protect against a form of infection of the liver by a virus acquired from blood or body fluids, particularly if you work in health care.  If you plan to travel internationally, check with your local health department for specific vaccination recommendations.  Cancer Screening:  Breast cancer screening is essential to preventive care for women. All women age 56 and older should perform a breast self-exam every month. At age 55 and older, women should have their caregiver complete a breast exam each year. Women at ages 12 and older should have a mammogram (x-ray film) of the breasts. Your caregiver can discuss how often you need mammograms.    Cervical cancer screening includes taking a Pap smear (sample of cells examined under a microscope) from the cervix (end of the uterus). It also includes testing for HPV (Human Papilloma Virus, which can cause cervical cancer). Screening and a pelvic exam should begin at age 9, or 3 years after a woman becomes sexually active. Screening should occur every year, with a Pap smear but no HPV testing, up to age 29. After age 65, you should have a Pap smear every 3 years with HPV testing, if no HPV was found previously.   Most routine colon cancer screening begins at the age of 14. On a yearly basis, doctors may provide  special easy to use take-home tests to check for hidden blood in the stool. Sigmoidoscopy or colonoscopy can detect the earliest forms of colon cancer and is life saving. These tests use a small camera at the end of a tube to directly examine the colon. Speak to your caregiver about this at age 39, when routine screening begins (and is repeated every 5 years unless early forms of pre-cancerous polyps or small growths are found).    Chronic Obstructive Pulmonary Disease Chronic obstructive pulmonary disease (COPD) is a common lung condition in which airflow from the lungs is limited.  COPD is a general term that can be used to describe many different lung problems that limit airflow, including both chronic bronchitis and emphysema. If you have COPD, your lung function will probably never return to normal, but there are measures you can take to improve lung function and make yourself feel better.  CAUSES   Smoking (common).   Exposure to secondhand smoke.   Genetic problems.  Chronic inflammatory lung diseases or recurrent infections. SYMPTOMS   Shortness of breath, especially with physical activity.   Deep, persistent (chronic) cough with a large amount of thick mucus.   Wheezing.   Rapid breaths (tachypnea).   Gray or bluish discoloration (cyanosis) of the skin, especially in fingers, toes, or lips.   Fatigue.   Weight loss.   Frequent infections or episodes when breathing symptoms become much worse (exacerbations).   Chest tightness. DIAGNOSIS  Your healthcare provider will take a medical history and perform a physical examination to make the initial diagnosis. Additional tests for COPD may include:   Lung (pulmonary) function tests.  Chest X-ray.  CT scan.  Blood tests. TREATMENT  Treatment available to help you feel better when you have COPD include:   Inhaler and nebulizer medicines. These help manage the symptoms of COPD and make your breathing more  comfortable  Supplemental oxygen. Supplemental oxygen is only helpful if you have a low oxygen level in your blood.   Exercise and physical activity. These are beneficial for nearly all people with COPD. Some people may also benefit from a pulmonary rehabilitation program. HOME CARE INSTRUCTIONS   Take all medicines (inhaled or pills) as directed by your health care provider.  Only take over-the-counter or prescription medicines for pain, fever, or discomfort as directed by your health care provider.   Avoid over-the-counter medicines or cough syrups that dry up your airway (such as antihistamines) and slow down the elimination of secretions unless instructed otherwise by your healthcare provider.   If you are a smoker, the most important thing that you can do is stop smoking. Continuing to smoke will cause further lung damage and breathing trouble. Ask your health care provider for help with quitting smoking. He or she can direct you to community resources or hospitals that provide support.  Avoid exposure to irritants such as smoke, chemicals, and fumes that aggravate your breathing.  Use oxygen therapy and pulmonary rehabilitation if directed by your health care provider. If you require home oxygen therapy, ask your healthcare provider whether you should purchase a pulse oximeter to measure your oxygen level at home.   Avoid contact with individuals who have a contagious illness.  Avoid extreme temperature and humidity changes.  Eat healthy foods. Eating smaller, more frequent meals and resting before meals may help you maintain your strength.  Stay active, but balance activity with periods of rest. Exercise and physical activity will help you maintain your ability to do things you want to do.  Preventing infection and hospitalization is very important when you have COPD. Make sure to receive all the vaccines your health care provider recommends, especially the pneumococcal and  influenza vaccines. Ask your healthcare provider whether you need a pneumonia vaccine.  Learn and use relaxation techniques to manage stress.  Learn and use controlled breathing techniques as directed by your health care provider. Controlled breathing techniques include:   Pursed lip breathing. Start by breathing in (inhaling) through your nose for 1 second. Then, purse your lips as if you were going to whistle and breathe  out (exhale) through the pursed lips for 2 seconds.   Diaphragmatic breathing. Start by putting one hand on your abdomen just above your waist. Inhale slowly through your nose. The hand on your abdomen should move out. Then purse your lips and exhale slowly. You should be able to feel the hand on your abdomen moving in as you exhale.   Learn and use controlled coughing to clear mucus from your lungs. Controlled coughing is a series of short, progressive coughs. The steps of controlled coughing are:  1. Lean your head slightly forward.  2. Breathe in deeply using diaphragmatic breathing.  3. Try to hold your breath for 3 seconds.  4. Keep your mouth slightly open while coughing twice.  5. Spit any mucus out into a tissue.  6. Rest and repeat the steps once or twice as needed. SEEK MEDICAL CARE IF:   You are coughing up more mucus than usual.   There is a change in the color or thickness of your mucus.   Your breathing is more labored than usual.   Your breathing is faster than usual.  SEEK IMMEDIATE MEDICAL CARE IF:   You have shortness of breath while you are resting.   You have shortness of breath that prevents you from:  Being able to talk.   Performing your usual physical activities.   You have chest pain lasting longer than 5 minutes.   Your skin color is more cyanotic than usual.  You measure low oxygen saturations for longer than 5 minutes with a pulse oximeter. MAKE SURE YOU:   Understand these instructions.  Will watch your  condition.  Will get help right away if you are not doing well or get worse. Document Released: 05/02/2005 Document Revised: 05/13/2013 Document Reviewed: 03/19/2013 [facility] Patient Information 2014 [place], [place].

## 2013-12-01 NOTE — Progress Notes (Signed)
Subjective:   [name] is a 68 y.o. female who presents for Medicare Annual Wellness Visit and complete physical.    Date of last medicare wellness visit is unknown.  Her blood pressure has been controlled at home, today their BP is BP: 128/68 mmHg She does not workout, just getting over pneumonia. She denies chest pain, dizziness. Still has some SOB due to recent pneumonia and still smoking.  She is not on cholesterol medication and denies myalgias. Her cholesterol is at goal. The cholesterol last visit was:   Lab Results  Component Value Date   CHOL 165 08/28/2013   HDL 45 08/28/2013   LDLCALC 83 08/28/2013   TRIG 185* 08/28/2013   CHOLHDL 3.7 08/28/2013    Last A1C in the office was:  Lab Results  Component Value Date   HGBA1C 5.2 08/28/2013   Patient is on Vitamin D supplement.   She is no longer sees Dr. Hardin Negus for pain management, she takes about 1/2 a pill 4-5 times a day.  She has esophageal varices and cirrohsis being treated by Dr. Deatra Ina. States can swallow better after EGD Had CT neck for lymphadenopathy that was neg for cancer.  She is chronic smoker and chooses to continue to smoke, she was treated for a pneumonia on 03/27 but she is still wheezing and very short of breath.  CLINICAL DATA: Cough.  EXAM:  CHEST 2 VIEW  COMPARISON: None.  FINDINGS:  Mediastinum and hilar structures are normal. Mild increased markings  in both lung bases noted. Mild pneumonia cannot be excluded. Heart  size and pulmonary vascularity normal. No pleural effusion or  pneumothorax. Degenerative changes thoracic spine. Prior cervical  spine fusion and right shoulder replacement. Old right rib  fractures.  IMPRESSION:  Minimal increased markings in both lung bases, this may be of  related to overlying soft tissues however mild pneumonitis in the  lung bases cannot be excluded.     Names of Other Physician/Practitioners you currently use: 1. [facility]- here for primary care 2. Dr. Patrici Ranks, eye doctor, last visit 2 years 3. Dr. Johnella Moloney, dentist, last visit once yearly, saw last week Patient Care Team: Unk Pinto, MD as PCP - General (Internal Medicine) Inda Castle, MD as Consulting Physician (Gastroenterology)   Medication Review Current Outpatient Prescriptions on File Prior to Visit  Medication Sig Dispense Refill  . acetaminophen-codeine (TYLENOL #3) 300-30 MG per tablet Take by mouth every 4 (four) hours as needed for moderate pain.      Marland Kitchen amitriptyline (ELAVIL) 10 MG tablet TAKE 1 TABLET BY MOUTH 3 TIMES A DAY AS NEEDED  270 tablet  1  . aspirin 81 MG tablet Take 81 mg by mouth daily.      . benzonatate (TESSALON) 100 MG capsule TAKE 1 CAPSULE (100 MG TOTAL) BY MOUTH 3 (THREE) TIMES DAILY AS NEEDED FOR COUGH.  42 capsule  1  . Cholecalciferol (VITAMIN D-3) 1000 UNITS CAPS Take 5,000 Units by mouth daily.      . citalopram (CELEXA) 40 MG tablet TAKE 1 TABLET BY MOUTH EVERY DAY FOR MOOD  90 tablet  3  . cyclobenzaprine (FLEXERIL) 10 MG tablet Take 10 mg by mouth 3 (three) times daily as needed for muscle spasms.      Marland Kitchen doxycycline (VIBRA-TABS) 100 MG tablet Take 1 tablet (100 mg total) by mouth 2 (two) times daily.  20 tablet  0  . ferrous sulfate 325 (65 FE) MG tablet  TAKE 1 TABLET TWICE DAILY  60 tablet  5  . furosemide (LASIX) 40 MG tablet Take 1 tablet (40 mg total) by mouth daily.  30 tablet  11  . gabapentin (NEURONTIN) 300 MG capsule TAKE ONE CAPSULE 3 TIMES A DAY  90 capsule  11  . Magnesium 250 MG TABS Take 250 mg by mouth daily at 6 (six) AM.      . metFORMIN (GLUCOPHAGE-XR) 500 MG 24 hr tablet Take 500 mg by mouth 4 (four) times daily.       . nadolol (CORGARD) 40 MG tablet Take 1 tablet (40 mg total) by mouth daily.  30 tablet  5  . Omega-3 Fatty Acids (FISH OIL) 1000 MG CAPS Take 1,000 mg by mouth 2 (two) times daily.      Marland Kitchen OVER THE COUNTER MEDICATION 100 mg as needed. Stool softner      . OVER  THE COUNTER MEDICATION as needed. Natural Veg Laxative      . pantoprazole (PROTONIX) 40 MG tablet       . predniSONE (DELTASONE) 10 MG tablet 1 po TID x 3 days, 1 PO BID x 3 days, 1 po QD x 5 days  20 tablet  0  . promethazine-codeine (PHENERGAN WITH CODEINE) 6.25-10 MG/5ML syrup Take 5 mLs by mouth every 6 (six) hours as needed for cough.  480 mL  0  . spironolactone (ALDACTONE) 25 MG tablet TAKE 1 TABLET EVERY MORNING (STOP FUROSEMIDE AND METAXOLONE)  90 tablet  1  . tamoxifen (NOLVADEX) 20 MG tablet Take 20 mg by mouth daily.       No current facility-administered medications on file prior to visit.    Current Problems (verified) Patient Active Problem List   Diagnosis Date Noted  . Hypertension   . Hyperlipidemia   . Asthma   . COPD (chronic obstructive pulmonary disease)   . GERD (gastroesophageal reflux disease)   . Vitamin D deficiency   . IBS (irritable bowel syndrome)   . Fibromyalgia   . Dysphagia, unspecified(787.20) 10/21/2013  . Type II or unspecified type diabetes mellitus without mention of complication, not stated as uncontrolled 08/31/2013  . Esophageal varices without mention of bleeding 07/08/2012  . Early satiety 07/08/2012  . COLONIC POLYPS 11/11/2008  . ABDOMINAL PAIN RIGHT UPPER QUADRANT 11/11/2008  . ANEMIA 05/28/2008  . CIRRHOSIS OF LIVER WITHOUT MENTION OF ALCOHOL 05/28/2008  . ESOPHAGITIS 05/27/2008  . DIVERTICULOSIS OF COLON 05/27/2008    Screening Tests Health Maintenance  Topic Date Due  . Zostavax  11/12/2006  . Mammogram  03/13/2013  . Influenza Vaccine  03/06/2014  . Colonoscopy  07/20/2018  . Tetanus/tdap  11/20/2022  . Pneumococcal Polysaccharide Vaccine Age 98 And Over  Completed    Immunization History  Administered Date(s) Administered  . Hep A / Hep B 11/05/2013, 11/12/2013  . Influenza Split 05/26/2013  . Pneumococcal Polysaccharide-23 08/28/2013  . Tdap 11/19/2012    Preventative care: Last colonoscopy: 2009 Last  mammogram: 2013- patient states it hurts her right arm too much.  Last pap smear/pelvic exam: *remote DEXA: 12/2012   Prior vaccinations: TD or Tdap: 2014  Influenza: 2014 Pneumococcal: 2015 Shingles/Zostavax: declines  History reviewed: allergies, current medications, past family history, past medical history, past social history, past surgical history and problem list  Risk Factors: Osteoporosis: postmenopausal estrogen deficiency and dietary calcium and/or vitamin D deficiency History of fracture in the past year: no  Tobacco History  Substance Use Topics  . Smoking status: Current Every  Day Smoker -- 1.00 packs/day for 50 years    Types: Cigarettes  . Smokeless tobacco: Never Used     Comment: Tobacco info given 10/21/13  . Alcohol Use: Yes     Comment: rarely   She does smoke.   Are there smokers in your home (other than you)?  Yes- several of her kids and grand daughter  Alcohol Current alcohol use: social drinker  Caffeine Current caffeine use: coffee 1-2 /day  Exercise Current exercise habits: The patient does not participate in regular exercise at present.  Current exercise: no regular exercise  Nutrition/Diet Current diet: in general, a "healthy" diet    Cardiac risk factors: advanced age (older than 93 for men, 39 for women), diabetes mellitus, dyslipidemia, family history of premature cardiovascular disease, hypertension, sedentary lifestyle and smoking/ tobacco exposure.  Depression Screen (Note: if answer to either of the following is "Yes", a more complete depression screening is indicated)   Q1: Over the past two weeks, have you felt down, depressed or hopeless? No  Q2: Over the past two weeks, have you felt little interest or pleasure in doing things? No  Have you lost interest or pleasure in daily life? No  Do you often feel hopeless? No  Do you cry easily over simple problems? No  Activities of Daily Living In your present state of health, do  you have any difficulty performing the following activities?:  Driving? yes Managing money?  No Feeding yourself? No Getting from bed to chair? Yes Climbing a flight of stairs? Yes Preparing food and eating?: No Bathing or showering? No Getting dressed: No Getting to the toilet? No Using the toilet:No Moving around from place to place: Yes In the past year have you fallen or had a near fall?:No   Are you sexually active?  No  Do you have more than one partner?  No  Vision Difficulties: Yes  Hearing Difficulties: Yes Do you often ask people to speak up or repeat themselves? Yes Do you experience ringing or noises in your ears? No Do you have difficulty understanding soft or whispered voices? Yes  Cognition  Do you feel that you have a problem with memory?No  Do you often misplace items? Yes  Do you feel safe at home?  Yes  Advanced directives Does patient have a [place]? No Does patient have a Living Will? No    Objective:   Blood pressure 128/68, pulse 72, temperature 97.9 F (36.6 C), resp. rate 16, height 5' 2.5" (1.588 m), weight 143 lb (64.864 kg). Body mass index is 25.72 kg/(m^2).  General appearance: alert, no distress, WD/WN,  female Cognitive Testing  Alert? Yes  Normal Appearance?Yes  Oriented to person? Yes  Place? Yes   Time? Yes  Recall of three objects?  No  Can perform simple calculations? Yes  Displays appropriate judgment?Yes  Can read the correct time from a watch face?Yes  HEENT: normocephalic, sclerae anicteric, TMs pearly, nares patent, no discharge or erythema, pharynx normal Oral cavity: MMM, no lesions Neck: supple, small unchanged tender mobile left anterior lymphadenopathy, no thyromegaly Heart: RRR, normal S1, S2, no murmurs Lungs:decrease breath sounds with mild wheezing.  Abdomen: +bs, soft, tender right upper quadrant, non distended, no masses, no hepatomegaly, no splenomegaly Musculoskeletal: nontender, no  swelling, no obvious deformity Extremities: no edema, no cyanosis, no clubbing Pulses: see foot exam Neurological: alert, oriented x 3, CN2-12 intact, strength normal upper extremities and lower extremities, sensation decreased to shins, DTRs  2+ throughout, no cerebellar signs, gait antalgic Psychiatric: normal affect, behavior normal, pleasant  Breast: no masses.  Gyn: defer Rectal: defer  Assessment:    1. Hypertension - CBC with Differential - BASIC METABOLIC PANEL WITH GFR - Hepatic function panel - TSH - Urinalysis, Routine w reflex microscopic - Microalbumin / creatinine urine ratio - EKG 12-Lead - [place], RETROPERITNL ABD,  LTD  2. COPD (chronic obstructive pulmonary disease) - DG Chest 2 View; Future - Oxygen therapy; Future -Given Spiriva respmet and Brovana  3. Type II or unspecified type diabetes mellitus without mention of complication, not stated as uncontrolled Discussed general issues about diabetes pathophysiology and management., Educational material distributed., Suggested low cholesterol diet., Encouraged aerobic exercise., Discussed foot care., Reminded to get yearly retinal exam. - Hemoglobin A1c - HM DIABETES FOOT EXAM  4. Hyperlipidemia - Lipid panel  5. Vitamin D deficiency - Vit D  25 hydroxy (rtn osteoporosis monitoring)  6. Encounter for long-term (current) use of other medications - Magnesium  7. Dyspnea with hypoxia SOB- worsening- RA at 91%, with walking she got to 81%, went up to 94% on 3L with walking- will give more ABX, recheck CXR- we did discuss possibly getting a CT scan but she states she will not stop smoking and if she has lung cancer would not want treatment.  - Oxygen therapy; Future  8. Peripheral vascular disease likely however patient will continue to smoke, no ulcers, and she is very inactive. Will monitor.   Poor prognosis- and this was discussed with the patient and her husband   Plan:   During the course of the visit the  patient was educated and counseled about appropriate screening and preventive services including:    Pneumococcal vaccine   Influenza vaccine  Td vaccine  Screening electrocardiogram  Screening mammography  Bone densitometry screening  Colorectal cancer screening  Diabetes screening  Glaucoma screening  Nutrition counseling   Smoking cessation counseling  Screening recommendations, referrals:  Vaccinations: Tdap vaccine not indicated Influenza vaccine not indicated Pneumococcal vaccine not indicated Shingles vaccine declined Hep B vaccine not indicated  Nutrition assessed and recommended  Colonoscopy declined Mammogram declined Pap smear not indicated Pelvic exam not indicated Recommended yearly ophthalmology/optometry visit for glaucoma screening and checkup Recommended yearly dental visit for hygiene and checkup Advanced directives - requested  Conditions/risks identified: BMI: Discussed weight loss, diet, and increase physical activity.  Increase physical activity: AHA recommends 150 minutes of physical activity a week.  Medications reviewed DEXA- declined Diabetes at goal, ACE/ARB therapy Yes. Urinary Incontinence is an issue: discussed non pharmacology and pharmacology options.  Fall risk: high- discussed PT, home fall assessment, medications.   Medicare Attestation I have personally reviewed: The patient's medical and social history Their use of alcohol, tobacco or illicit drugs Their current medications and supplements The patient's functional ability including ADLs,fall risks, home safety risks, cognitive, and hearing and visual impairment Diet and physical activities Evidence for depression or mood disorders  The patient's weight, height, BMI, and visual acuity have been recorded in the chart.  I have made referrals, counseling, and provided education to the patient based on review of the above and I have provided the patient with a written  personalized care plan for preventive services.     Vicie Mutters, PA-C   12/01/2013

## 2013-12-02 ENCOUNTER — Telehealth: Payer: Self-pay

## 2013-12-02 LAB — URINALYSIS, MICROSCOPIC ONLY
BACTERIA UA: NONE SEEN
CASTS: NONE SEEN
Crystals: NONE SEEN

## 2013-12-02 LAB — HEMOGLOBIN A1C
HEMOGLOBIN A1C: 5.4 % (ref <5.7)
MEAN PLASMA GLUCOSE: 108 mg/dL (ref <117)

## 2013-12-02 LAB — URINALYSIS, ROUTINE W REFLEX MICROSCOPIC
Bilirubin Urine: NEGATIVE
Glucose, UA: NEGATIVE mg/dL
Hgb urine dipstick: NEGATIVE
KETONES UR: NEGATIVE mg/dL
NITRITE: POSITIVE — AB
PROTEIN: NEGATIVE mg/dL
Specific Gravity, Urine: 1.008 (ref 1.005–1.030)
UROBILINOGEN UA: 0.2 mg/dL (ref 0.0–1.0)
pH: 7 (ref 5.0–8.0)

## 2013-12-02 LAB — CBC WITH DIFFERENTIAL/PLATELET
Basophils Absolute: 0.1 10*3/uL (ref 0.0–0.1)
Basophils Relative: 1 % (ref 0–1)
Eosinophils Absolute: 0.1 10*3/uL (ref 0.0–0.7)
Eosinophils Relative: 2 % (ref 0–5)
HCT: 44.1 % (ref 36.0–46.0)
Hemoglobin: 15.2 g/dL — ABNORMAL HIGH (ref 12.0–15.0)
Lymphocytes Relative: 33 % (ref 12–46)
Lymphs Abs: 2 10*3/uL (ref 0.7–4.0)
MCH: 31 pg (ref 26.0–34.0)
MCHC: 34.5 g/dL (ref 30.0–36.0)
MCV: 89.8 fL (ref 78.0–100.0)
Monocytes Absolute: 0.4 10*3/uL (ref 0.1–1.0)
Monocytes Relative: 6 % (ref 3–12)
Neutro Abs: 3.6 10*3/uL (ref 1.7–7.7)
Neutrophils Relative %: 58 % (ref 43–77)
Platelets: 110 10*3/uL — ABNORMAL LOW (ref 150–400)
RBC: 4.91 MIL/uL (ref 3.87–5.11)
RDW: 14.3 % (ref 11.5–15.5)
WBC: 6.2 10*3/uL (ref 4.0–10.5)

## 2013-12-02 LAB — MICROALBUMIN / CREATININE URINE RATIO
Creatinine, Urine: 44.2 mg/dL
Microalb Creat Ratio: 11.3 mg/g (ref 0.0–30.0)
Microalb, Ur: 0.5 mg/dL (ref 0.00–1.89)

## 2013-12-02 LAB — VITAMIN D 25 HYDROXY (VIT D DEFICIENCY, FRACTURES): Vit D, 25-Hydroxy: 97 ng/mL — ABNORMAL HIGH (ref 30–89)

## 2013-12-02 LAB — TSH: TSH: 0.914 u[IU]/mL (ref 0.350–4.500)

## 2013-12-02 NOTE — Telephone Encounter (Signed)
Message copied by Nadyne Coombes on Wed Dec 02, 2013 11:10 AM ------      Message from: Vicie Mutters R      Created: Wed Dec 02, 2013  8:50 AM       Pneumonia does appear to be improved, does show increased pulmonary vascularity. We will try to get out portable oxygen and do nocturnal oximetry to evaluate for nocturnal O2. Schedule follow up OV in 10 days. If worseing SOB or CP go to the ER. ------

## 2013-12-02 NOTE — Telephone Encounter (Signed)
Pt n/a. wcb for cxr results.

## 2013-12-02 NOTE — Telephone Encounter (Signed)
Message copied by Nadyne Coombes on Wed Dec 02, 2013 11:10 AM ------      Message from: Vicie Mutters R      Created: Wed Dec 02, 2013  8:15 AM       All of your labs are normal except:      Thrombocytopenia- continue to monitor      Triglycerides are simple fats in blood that are converted into a storage form. I recommend you avoid fried/greasy foods, sweets/candy, white rice , white potatoes,  anything made from white flour, sweet tea, soda, fruit juices and avoid alcohol in excess. Sweet potatoes, brown/wild rice/Quinoa, Vegetarian, spinach, or wheat pasta, Multi-grain bread - like multi-grain flat bread or sandwich thins are okay.      Your HDL is low. The goal is greater than 64 for men and greater than 63 for women.  H-density lipoprotein cholesterol is considered to be beneficial because it removes excess cholesterol and disposes of it. Hence HDL cholesterol is often termed "good" cholesterol. Ways to increase your HDL is to add more veggies and walk more. The American Heart Association says that 150 mins a week of walking or cardio is good for your heart and will improve your HDL.      Your LDL is not in range. Your LDL is the bad cholesterol that can lead to heart attack and stroke. To lower your number you can decrease your fatty foods, red meat, cheese, milk and increase fiber like whole grains and veggies. You can also add a fiber supplement like Metamucil or Benefiber.       Rechecking urine. ------

## 2013-12-02 NOTE — Telephone Encounter (Signed)
Pt n/a. wcb for lab results.

## 2013-12-04 LAB — URINE CULTURE: Colony Count: >=100000

## 2013-12-07 ENCOUNTER — Other Ambulatory Visit: Payer: Self-pay | Admitting: Physician Assistant

## 2013-12-07 ENCOUNTER — Ambulatory Visit (INDEPENDENT_AMBULATORY_CARE_PROVIDER_SITE_OTHER): Payer: Medicare Other | Admitting: Gastroenterology

## 2013-12-07 DIAGNOSIS — Z23 Encounter for immunization: Secondary | ICD-10-CM

## 2013-12-07 DIAGNOSIS — K7460 Unspecified cirrhosis of liver: Secondary | ICD-10-CM

## 2013-12-07 DIAGNOSIS — H9190 Unspecified hearing loss, unspecified ear: Secondary | ICD-10-CM

## 2013-12-14 ENCOUNTER — Encounter: Payer: Self-pay | Admitting: Internal Medicine

## 2013-12-14 ENCOUNTER — Ambulatory Visit (INDEPENDENT_AMBULATORY_CARE_PROVIDER_SITE_OTHER): Payer: Medicare Other | Admitting: Internal Medicine

## 2013-12-14 VITALS — BP 104/58 | HR 68 | Temp 98.2°F | Resp 18 | Ht 62.5 in | Wt 148.0 lb

## 2013-12-14 DIAGNOSIS — J449 Chronic obstructive pulmonary disease, unspecified: Secondary | ICD-10-CM

## 2013-12-14 DIAGNOSIS — Z7189 Other specified counseling: Secondary | ICD-10-CM | POA: Insufficient documentation

## 2013-12-14 DIAGNOSIS — I10 Essential (primary) hypertension: Secondary | ICD-10-CM

## 2013-12-14 DIAGNOSIS — J45909 Unspecified asthma, uncomplicated: Secondary | ICD-10-CM

## 2013-12-14 NOTE — Patient Instructions (Signed)
Chronic Obstructive Pulmonary Disease Chronic obstructive pulmonary disease (COPD) is a common lung problem. In COPD, the flow of air from the lungs is limited. The way your lungs work will probably never return to normal, but there are things you can do to improve you lungs and make yourself feel better. HOME CARE  Take all medicines as told by your doctor.  Only take over-the-counter or prescription medicines as told by your doctor.  Avoid medicines or cough syrups that dry up your airway (such as antihistamines) and do not allow you to get rid of thick spit. You do not need to avoid them if told differently by your doctor.  If you smoke, stop. Smoking makes the problem worse.  Avoid being around things that make your breathing worse (like smoke, chemicals, and fumes).  Use oxygen therapy and therapy to help improve your lungs (pulmonary rehabilitation) if told by your doctor. If you need home oxygen therapy, ask your doctor if you should buy a tool to measure your oxygen level (oximeter).  Avoid people who have a sickness you can catch (contagious).  Avoid going outside when it is very hot, cold, or humid.  Eat healthy foods. Eat smaller meals more often. Rest before meals.  Stay active, but remember to also rest.  Make sure to get all the shots (vaccines) your doctor recommends. Ask your doctor if you need a pneumonia shot.  Learn and use tips on how to relax.  Learn and use tips on how to control your breathing as told by your doctor. Try:  Breathing in (inhaling) through your nose for 1 second. Then, pucker your lips and breath out (exhale) through your lips for 2 seconds.  Putting one hand on your belly (abdomen). Breathe in slowly through your nose for 1 second. Your hand on your belly should move out. Pucker your lips and breathe out slowly through your lips. Your hand on your belly should move in as you breathe out.  Learn and use controlled coughing to clear thick spit  from your lungs. 1. Lean your head a little forward. 2. Breathe in deeply. 3. Try to hold your breath for 3 seconds. 4. Keep your mouth slightly open while coughing 2 times. 5. Spit any thick spit out into a tissue. 6. Rest and do the steps again 1 or 2 times as needed. GET HELP IF:  You cough up more thick spit than usual.  There is a change in the color or thickness of the spit.  It is harder to breathe than usual.  Your breathing is faster than usual. GET HELP RIGHT AWAY IF:   You have shortness of breath while resting.  You have shortness of breath that stops you from:  Being able to talk.  Doing normal activities.  You chest hurts for longer than 5 minutes.  Your skin color is more blue than usual.  Your pulse oximeter shows that you have low oxygen for longer than 5 minutes. MAKE SURE YOU:   Understand these instructions.  Will watch your condition.  Will get help right away if you are not doing well or get worse. Document Released: 01/09/2008 Document Revised: 05/13/2013 Document Reviewed: 03/19/2013 [facility] Patient Information 2014 [place], [place].

## 2013-12-14 NOTE — Progress Notes (Signed)
Subjective:    Patient ID: [name], female    DOB: 07/22/1946, 68 y.o.   MRN: [id number]  HPI Patient is seen in F/U of starting O2 . At last OV resting O2 Sat was 91% and qiuckly dropped to 81% walking less than 50 feet and then repeated on 3 LPM O2 her Sat rose to 94%.  Since on O2 at home , her husband reports she feels much better and that she is mentally more alert. Once again patient refuses to quit smoking and declines Treatment if she were to develop Lung Cancer.She is also aware to turn off her O2 when she is smoking.  Current Outpatient Prescriptions on File Prior to Visit  Medication Sig Dispense Refill  . acetaminophen-codeine (TYLENOL #3) 300-30 MG per tablet Take by mouth every 4 (four) hours as needed for moderate pain.      Marland Kitchen amitriptyline (ELAVIL) 10 MG tablet TAKE 1 TABLET BY MOUTH 3 TIMES A DAY AS NEEDED  270 tablet  1  . arformoterol (BROVANA) 15 MCG/2ML NEBU Take 2 mLs (15 mcg total) by nebulization every morning.  120 mL  0  . aspirin 81 MG tablet Take 81 mg by mouth daily.      . benzonatate (TESSALON) 100 MG capsule TAKE 1 CAPSULE (100 MG TOTAL) BY MOUTH 3 (THREE) TIMES DAILY AS NEEDED FOR COUGH.  42 capsule  1  . Cholecalciferol (VITAMIN D-3) 1000 UNITS CAPS Take 5,000 Units by mouth daily.      . citalopram (CELEXA) 40 MG tablet TAKE 1 TABLET BY MOUTH EVERY DAY FOR MOOD  90 tablet  3  . cyclobenzaprine (FLEXERIL) 10 MG tablet Take 10 mg by mouth 3 (three) times daily as needed for muscle spasms.      Marland Kitchen doxycycline (VIBRA-TABS) 100 MG tablet Take 1 tablet (100 mg total) by mouth 2 (two) times daily.  20 tablet  0  . ferrous sulfate 325 (65 FE) MG tablet TAKE 1 TABLET TWICE DAILY  60 tablet  5  . furosemide (LASIX) 40 MG tablet Take 1 tablet (40 mg total) by mouth daily.  30 tablet  11  . gabapentin (NEURONTIN) 300 MG capsule TAKE ONE CAPSULE 3 TIMES A DAY  90 capsule  11  . Magnesium 250 MG TABS Take 250 mg by mouth daily at 6 (six) AM.      . metFORMIN  (GLUCOPHAGE-XR) 500 MG 24 hr tablet Take 500 mg by mouth 4 (four) times daily.       . nadolol (CORGARD) 40 MG tablet Take 1 tablet (40 mg total) by mouth daily.  30 tablet  5  . Omega-3 Fatty Acids (FISH OIL) 1000 MG CAPS Take 1,000 mg by mouth 2 (two) times daily.      Marland Kitchen OVER THE COUNTER MEDICATION 100 mg as needed. Stool softner      . OVER THE COUNTER MEDICATION as needed. Natural Veg Laxative      . pantoprazole (PROTONIX) 40 MG tablet       . promethazine-codeine (PHENERGAN WITH CODEINE) 6.25-10 MG/5ML syrup Take 5 mLs by mouth every 6 (six) hours as needed for cough.  480 mL  0  . spironolactone (ALDACTONE) 25 MG tablet TAKE 1 TABLET EVERY MORNING (STOP FUROSEMIDE AND METAXOLONE)  90 tablet  1  . tamoxifen (NOLVADEX) 20 MG tablet Take 20 mg by mouth daily.       No current facility-administered medications on file prior to visit.   Allergies  Allergen  Reactions  . Atorvastatin   . Diphenhydramine Hcl   . Hydrocodone-Acetaminophen   . Loratadine-Pseudoephedrine Er   . Lorazepam   . Simvastatin   . Sulfonamide Derivatives     Past Medical History  Diagnosis Date  . Hx of shoulder replacement   . Diabetes mellitus     reports "borderline"  . Type II or unspecified type diabetes mellitus without mention of complication, not stated as uncontrolled 08/31/2013  . Hypertension   . Hyperlipidemia   . Asthma   . COPD (chronic obstructive pulmonary disease)   . GERD (gastroesophageal reflux disease)   . Vitamin D deficiency   . IBS (irritable bowel syndrome)   . Fibromyalgia    Review of Systems In addition to the HPI above,  No Fever-chills,  No Headache, No changes with Vision or hearing,  No problems swallowing food or Liquids,  No Chest pain or productive Cough,  No Abdominal pain, No Nausea or Vommitting, Bowel movements are regular,  No Blood in stool or Urine,  No dysuria,  No new skin rashes or bruises,  No new joints pains-aches,  No new weakness, tingling,  numbness in any extremity,  No recent weight loss,  No polyuria, polydypsia or polyphagia,  No significant Mental Stressors.  A full 10 point Review of Systems was done, except as stated above, all other Review of Systems were negative  Objective:   Physical Exam  BP 104/58  Pulse 68  Temp 98.2 F   Resp 18  Ht 5' 2.5"   Wt 148 lb BMI 26.62 kg/m2  SpO2 96%   HEENT - Eac's patent. TM's Nl.EOM's full. PERRLA. NasoOroPharynx clear. Neck - supple. Nl Thyroid. No bruits nodes JVD Chest - barrel configured with increased AP diameter and very distand BS and few post tussive forced wheezes. Cor - Nl HS. RRR w/o sig MGR. PP 1(+) No edema. Abd - No palpable organomegaly, masses or tenderness. BS nl. MS- FROM. w/o deformities. Muscle power tone and bulk Nl. Gait Nl. Neuro - No obvious Cr N abnormalities. Sensory, motor and Cerebellar functions appear Nl w/o focal abnormalities.  Assessment & Plan:   1. COPD (chronic obstructive pulmonary disease) - End Stage  2. Asthma  3. Hypertension

## 2013-12-25 ENCOUNTER — Other Ambulatory Visit: Payer: Self-pay | Admitting: Emergency Medicine

## 2013-12-25 MED ORDER — IPRATROPIUM BROMIDE 0.02 % IN SOLN: 0.5000 mg | RESPIRATORY_TRACT | Status: DC | PRN

## 2014-01-07 ENCOUNTER — Ambulatory Visit (INDEPENDENT_AMBULATORY_CARE_PROVIDER_SITE_OTHER): Payer: Medicare Other

## 2014-01-07 DIAGNOSIS — N3000 Acute cystitis without hematuria: Secondary | ICD-10-CM

## 2014-01-07 NOTE — Progress Notes (Signed)
Patient ID: [name], female   DOB: [birth year], 68 y.o.   MRN: [id number] Patient here today to recheck urine. Patient did verify that she completed prescribed antibiotic.

## 2014-01-08 LAB — URINALYSIS, ROUTINE W REFLEX MICROSCOPIC
BILIRUBIN URINE: NEGATIVE
GLUCOSE, UA: NEGATIVE mg/dL
Hgb urine dipstick: NEGATIVE
Ketones, ur: NEGATIVE mg/dL
Leukocytes, UA: NEGATIVE
Nitrite: NEGATIVE
Protein, ur: NEGATIVE mg/dL
Specific Gravity, Urine: 1.006 (ref 1.005–1.030)
Urobilinogen, UA: 0.2 mg/dL (ref 0.0–1.0)
pH: 7 (ref 5.0–8.0)

## 2014-01-09 LAB — URINE CULTURE: Colony Count: 5000

## 2014-01-17 ENCOUNTER — Other Ambulatory Visit: Payer: Self-pay | Admitting: Emergency Medicine

## 2014-01-17 MED ORDER — BENZONATATE 100 MG PO CAPS: ORAL_CAPSULE | ORAL | Status: DC

## 2014-01-25 ENCOUNTER — Encounter: Payer: Self-pay | Admitting: Gastroenterology

## 2014-01-29 ENCOUNTER — Encounter: Payer: Self-pay | Admitting: Internal Medicine

## 2014-01-29 ENCOUNTER — Ambulatory Visit (INDEPENDENT_AMBULATORY_CARE_PROVIDER_SITE_OTHER): Payer: Medicare Other | Admitting: Internal Medicine

## 2014-01-29 VITALS — BP 140/72 | HR 64 | Temp 97.7°F | Resp 16 | Ht 62.5 in | Wt 148.8 lb

## 2014-01-29 DIAGNOSIS — R609 Edema, unspecified: Secondary | ICD-10-CM

## 2014-01-29 DIAGNOSIS — I10 Essential (primary) hypertension: Secondary | ICD-10-CM

## 2014-01-29 MED ORDER — FUROSEMIDE 40 MG PO TABS: 40.0000 mg | ORAL_TABLET | Freq: Every day | ORAL | Status: DC

## 2014-01-29 MED ORDER — ACETAMINOPHEN-CODEINE #3 300-30 MG PO TABS: ORAL_TABLET | ORAL | Status: DC

## 2014-01-29 NOTE — Progress Notes (Signed)
Subjective:    Patient ID: [name], female    DOB: 1946-07-22, 68 y.o.   MRN: [id number]  Fall Incident onset: about 3 weeks ago. Fall occurred: while bowling. She landed on hard floor. The point of impact was the right knee. The pain is present in the right lower leg. The pain is mild. The symptoms are aggravated by ambulation, flexion and use of injured limb. She has tried acetaminophen, heat and ice for the symptoms. The treatment provided no relief.     Medication List   acetaminophen-codeine 300-30 MG per tablet  Commonly known as:  TYLENOL #3  Take 1/2 to 1 tablet up to 3 to 4  X day as needed for severe pain     amitriptyline 10 MG tablet  Commonly known as:  ELAVIL  TAKE 1 TABLET BY MOUTH 3 TIMES A DAY AS NEEDED     arformoterol 15 MCG/2ML Nebu  Commonly known as:  BROVANA  Take 2 mLs (15 mcg total) by nebulization every morning.     aspirin 81 MG tablet  Take 81 mg by mouth daily.     benzonatate 100 MG capsule  Commonly known as:  TESSALON  TAKE 1 CAPSULE (100 MG TOTAL) BY MOUTH 3 (THREE) TIMES DAILY AS NEEDED FOR COUGH.     citalopram 40 MG tablet  Commonly known as:  CELEXA  TAKE 1 TABLET BY MOUTH EVERY DAY FOR MOOD     cyclobenzaprine 10 MG tablet  Commonly known as:  FLEXERIL  Take 10 mg by mouth 3 (three) times daily as needed for muscle spasms.     ferrous sulfate 325 (65 FE) MG tablet  TAKE 1 TABLET TWICE DAILY     Fish Oil 1000 MG Caps  Take 1,000 mg by mouth 2 (two) times daily.     furosemide 40 MG tablet  Commonly known as:  LASIX  Take 1 tablet (40 mg total) by mouth daily. For fluid     gabapentin 300 MG capsule  Commonly known as:  NEURONTIN  TAKE ONE CAPSULE 3 TIMES A DAY     ipratropium 0.02 % nebulizer solution  Commonly known as:  ATROVENT  Take 2.5 mLs (0.5 mg total) by nebulization every 4 (four) hours as needed for wheezing or shortness of breath.     Magnesium 250 MG Tabs  Take 250 mg by mouth daily at 6 (six) AM.     metFORMIN 500 MG 24 hr tablet  Commonly known as:  GLUCOPHAGE-XR  Take 500 mg by mouth 4 (four) times daily.     nadolol 40 MG tablet  Commonly known as:  CORGARD  Take 1 tablet (40 mg total) by mouth daily.     OVER THE COUNTER MEDICATION  100 mg as needed. Stool softner     OVER THE COUNTER MEDICATION  as needed. Natural Veg Laxative     pantoprazole 40 MG tablet  Commonly known as:  PROTONIX     spironolactone 25 MG tablet  Commonly known as:  ALDACTONE  TAKE 1 TABLET EVERY MORNING (STOP FUROSEMIDE AND METAXOLONE)     tamoxifen 20 MG tablet  Commonly known as:  NOLVADEX  Take 20 mg by mouth daily.     Vitamin D-3 1000 UNITS Caps  Take 5,000 Units by mouth daily.       Allergies  Allergen Reactions  . Atorvastatin   . Diphenhydramine Hcl   . Hydrocodone-Acetaminophen   . Loratadine-Pseudoephedrine Er   . Lorazepam   .  Simvastatin   . Sulfonamide Derivatives    Past Medical History  Diagnosis Date  . Hx of shoulder replacement   . Diabetes mellitus     reports "borderline"  . Type II or unspecified type diabetes mellitus without mention of complication, not stated as uncontrolled 08/31/2013  . Hypertension   . Hyperlipidemia   . Asthma   . COPD (chronic obstructive pulmonary disease)   . GERD (gastroesophageal reflux disease)   . Vitamin D deficiency   . IBS (irritable bowel syndrome)   . Fibromyalgia    Review of Systems  Constitutional: Negative.   HENT: Negative.   Eyes: Negative.   Respiratory: Negative.   Cardiovascular: Negative.   Gastrointestinal: Negative.   Musculoskeletal: Negative.       BP 140/72  P64  T97.7 F   Resp 16  Ht 5' 2.5"   Wt 148 lb 12.8 oz  BMI 26.77 kg/m2 Objective:   Physical Exam  Constitutional: She is oriented to person, place, and time. No distress.  HENT:  Head: Atraumatic.  Eyes: EOM are normal. Pupils are equal, round, and reactive to light.  Neck: Normal range of motion. No JVD present. No thyromegaly  present.  Cardiovascular: Normal rate, regular rhythm and normal heart sounds.   Pulmonary/Chest: Effort normal and breath sounds normal. No respiratory distress. She exhibits no tenderness.  Abdominal: Soft. There is no tenderness. There is no rebound.  Musculoskeletal: Normal range of motion. She exhibits tenderness. She exhibits no edema.  Rt knee - nl ROM - no effusion or crepitus. There is some sympathetic swelling of the Rt lower extremity w/o calf tenderness and Homen's sign is negative.  Neurological: She is alert and oriented to person, place, and time. She displays normal reflexes. No cranial nerve deficit.  Skin: Skin is warm and dry. No rash noted. No erythema. No pallor.  Psychiatric: She has a normal mood and affect.   Assessment & Plan:   1. Essential hypertension  2. Edema  Refill Tyl # 3 - #120  X 3 rf And advise use her lasix daily as needed for edema

## 2014-02-01 ENCOUNTER — Other Ambulatory Visit: Payer: Self-pay | Admitting: Internal Medicine

## 2014-02-01 DIAGNOSIS — K7460 Unspecified cirrhosis of liver: Secondary | ICD-10-CM

## 2014-02-01 MED ORDER — SPIRONOLACTONE 25 MG PO TABS
25.0000 mg | ORAL_TABLET | Freq: Two times a day (BID) | ORAL | Status: DC
Start: 2014-02-01 — End: 2014-03-15

## 2014-02-16 ENCOUNTER — Other Ambulatory Visit: Payer: Self-pay | Admitting: *Deleted

## 2014-02-16 MED ORDER — PANTOPRAZOLE SODIUM 40 MG PO TBEC: 40.0000 mg | DELAYED_RELEASE_TABLET | Freq: Two times a day (BID) | ORAL | Status: DC

## 2014-02-18 ENCOUNTER — Ambulatory Visit: Payer: Self-pay | Admitting: Internal Medicine

## 2014-03-03 ENCOUNTER — Ambulatory Visit: Payer: Self-pay | Admitting: Internal Medicine

## 2014-03-08 ENCOUNTER — Ambulatory Visit (INDEPENDENT_AMBULATORY_CARE_PROVIDER_SITE_OTHER): Payer: Medicare Other | Admitting: Physician Assistant

## 2014-03-08 ENCOUNTER — Encounter: Payer: Self-pay | Admitting: Physician Assistant

## 2014-03-08 VITALS — BP 122/62 | HR 72 | Temp 97.7°F | Resp 16 | Wt 147.0 lb

## 2014-03-08 DIAGNOSIS — E559 Vitamin D deficiency, unspecified: Secondary | ICD-10-CM

## 2014-03-08 DIAGNOSIS — E785 Hyperlipidemia, unspecified: Secondary | ICD-10-CM

## 2014-03-08 DIAGNOSIS — I10 Essential (primary) hypertension: Secondary | ICD-10-CM

## 2014-03-08 DIAGNOSIS — Z79899 Other long term (current) drug therapy: Secondary | ICD-10-CM

## 2014-03-08 MED ORDER — AZITHROMYCIN 250 MG PO TABS: ORAL_TABLET | ORAL | Status: DC

## 2014-03-08 MED ORDER — PREDNISONE 20 MG PO TABS: ORAL_TABLET | ORAL | Status: DC

## 2014-03-08 NOTE — Progress Notes (Signed)
Assessment and Plan:  Hypertension: Continue medication, monitor blood pressure at home. Continue DASH diet. Cholesterol: Continue diet and exercise. Check cholesterol.  Pre-diabetes-Continue diet and exercise. Check A1C Vitamin D Def- check level and continue medications.  COPD exacerbation- Zpak and prednisone sent in, if worse go to the ER  Continue diet and meds as discussed. Further disposition pending results of labs.  HPI 68 y.o. female  presents for 3 month follow up with hypertension, hyperlipidemia, prediabetes and vitamin D. Her blood pressure has been controlled at home, today their BP is BP: 122/62 mmHg She does not workout. She denies chest pain, shortness of breath, dizziness.  She is not on cholesterol medication and denies myalgias. Her cholesterol is at goal. The cholesterol last visit was:   Lab Results  Component Value Date   CHOL 206* 12/01/2013   HDL 37* 12/01/2013   LDLCALC 104* 12/01/2013   TRIG 323* 12/01/2013   CHOLHDL 5.6 12/01/2013   She has been working on diet and exercise for prediabetes, and denies polydipsia, polyuria and visual disturbances. Last A1C in the office was:  Lab Results  Component Value Date   HGBA1C 5.4 12/01/2013   Patient is on Vitamin D supplement.   Lab Results  Component Value Date   VD25OH 41* 12/01/2013     Patient has COPD, she is O2 dependent on 3 L O2, with [place], she has been having increasing SOB and some mucus with coughing for the past 2-3 weeks.   Current Medications:  Current Outpatient Prescriptions on File Prior to Visit  Medication Sig Dispense Refill  . acetaminophen-codeine (TYLENOL #3) 300-30 MG per tablet Take 1/2 to 1 tablet up to 3 to 4  X day as needed for severe pain  120 tablet  3  . amitriptyline (ELAVIL) 10 MG tablet TAKE 1 TABLET BY MOUTH 3 TIMES A DAY AS NEEDED  270 tablet  1  . arformoterol (BROVANA) 15 MCG/2ML NEBU Take 2 mLs (15 mcg total) by nebulization every morning.  120 mL  0  . aspirin 81 MG tablet  Take 81 mg by mouth daily.      . benzonatate (TESSALON) 100 MG capsule TAKE 1 CAPSULE (100 MG TOTAL) BY MOUTH 3 (THREE) TIMES DAILY AS NEEDED FOR COUGH.  100 capsule  1  . Cholecalciferol (VITAMIN D-3) 1000 UNITS CAPS Take 5,000 Units by mouth daily.      . citalopram (CELEXA) 40 MG tablet TAKE 1 TABLET BY MOUTH EVERY DAY FOR MOOD  90 tablet  3  . cyclobenzaprine (FLEXERIL) 10 MG tablet Take 10 mg by mouth 3 (three) times daily as needed for muscle spasms.      . ferrous sulfate 325 (65 FE) MG tablet TAKE 1 TABLET TWICE DAILY  60 tablet  5  . furosemide (LASIX) 40 MG tablet Take 1 tablet (40 mg total) by mouth daily. For fluid  30 tablet  11  . gabapentin (NEURONTIN) 300 MG capsule TAKE ONE CAPSULE 3 TIMES A DAY  90 capsule  11  . ipratropium (ATROVENT) 0.02 % nebulizer solution Take 2.5 mLs (0.5 mg total) by nebulization every 4 (four) hours as needed for wheezing or shortness of breath.  75 mL  2  . Magnesium 250 MG TABS Take 250 mg by mouth daily at 6 (six) AM.      . metFORMIN (GLUCOPHAGE-XR) 500 MG 24 hr tablet Take 500 mg by mouth 4 (four) times daily.       . nadolol (CORGARD) 40  MG tablet Take 1 tablet (40 mg total) by mouth daily.  30 tablet  5  . Omega-3 Fatty Acids (FISH OIL) 1000 MG CAPS Take 1,000 mg by mouth 2 (two) times daily.      Marland Kitchen OVER THE COUNTER MEDICATION 100 mg as needed. Stool softner      . OVER THE COUNTER MEDICATION as needed. Natural Veg Laxative      . pantoprazole (PROTONIX) 40 MG tablet Take 1 tablet (40 mg total) by mouth 2 (two) times daily.  180 tablet  1  . spironolactone (ALDACTONE) 25 MG tablet Take 1 tablet (25 mg total) by mouth 2 (two) times daily. For fluids and swelling  90 tablet  1  . tamoxifen (NOLVADEX) 20 MG tablet Take 20 mg by mouth daily.       No current facility-administered medications on file prior to visit.   Medical History:  Past Medical History  Diagnosis Date  . Hx of shoulder replacement   . Diabetes mellitus     reports  "borderline"  . Type II or unspecified type diabetes mellitus without mention of complication, not stated as uncontrolled 08/31/2013  . Hypertension   . Hyperlipidemia   . Asthma   . COPD (chronic obstructive pulmonary disease)   . GERD (gastroesophageal reflux disease)   . Vitamin D deficiency   . IBS (irritable bowel syndrome)   . Fibromyalgia    Allergies:  Allergies  Allergen Reactions  . Atorvastatin   . Diphenhydramine Hcl   . Hydrocodone-Acetaminophen   . Loratadine-Pseudoephedrine Er   . Lorazepam   . Simvastatin   . Sulfonamide Derivatives      Review of Systems: see HPI  Family history- Review and unchanged Social history- Review and unchanged Physical Exam: BP 122/62  Pulse 72  Temp(Src) 97.7 F (36.5 C)  Resp 16  Wt 147 lb (66.679 kg) Wt Readings from Last 3 Encounters:  03/08/14 147 lb (66.679 kg)  01/29/14 148 lb 12.8 oz (67.495 kg)  12/14/13 148 lb (67.132 kg)   General Appearance: Well nourished, in no apparent distress. Eyes: PERRLA, EOMs, conjunctiva no swelling or erythema Sinuses: No Frontal/maxillary tenderness ENT/Mouth: Ext aud canals clear, TMs without erythema, bulging. No erythema, swelling, or exudate on post pharynx.  Tonsils not swollen or erythematous. Hearing normal.  Neck: Supple, thyroid normal.  Respiratory: Decreased breath sounds with wheezing left greater than right.  Cardio: RRR with no MRGs. Brisk peripheral pulses without edema.  Abdomen: Soft, + BS.  Non tender, no guarding, rebound, hernias, masses. Lymphatics: Non tender without lymphadenopathy.  Musculoskeletal: Full ROM, 5/5 strength, normal gait.  Skin: Warm, dry without rashes, lesions, ecchymosis.  Neuro: Cranial nerves intact. Normal muscle tone, no cerebellar symptoms. Sensation intact.  Psych: Awake and oriented X 3, normal affect, Insight and Judgment appropriate.    Vicie Mutters 11:11 AM

## 2014-03-08 NOTE — Patient Instructions (Signed)
Chronic Obstructive Pulmonary Disease Chronic obstructive pulmonary disease (COPD) is a common lung problem. In COPD, the flow of air from the lungs is limited. The way your lungs work will probably never return to normal, but there are things you can do to improve your lungs and make yourself feel better. HOME CARE  Take all medicines as told by your doctor.  Avoid medicines or cough syrups that dry up your airway (such as antihistamines) and do not allow you to get rid of thick spit. You do not need to avoid them if told differently by your doctor.  If you smoke, stop. Smoking makes the problem worse.  Avoid being around things that make your breathing worse (like smoke, chemicals, and fumes).  Use oxygen therapy and therapy to help improve your lungs (pulmonary rehabilitation) if told by your doctor. If you need home oxygen therapy, ask your doctor if you should buy a tool to measure your oxygen level (oximeter).  Avoid people who have a sickness you can catch (contagious).  Avoid going outside when it is very hot, cold, or humid.  Eat healthy foods. Eat smaller meals more often. Rest before meals.  Stay active, but remember to also rest.  Make sure to get all the shots (vaccines) your doctor recommends. Ask your doctor if you need a pneumonia shot.  Learn and use tips on how to relax.  Learn and use tips on how to control your breathing as told by your doctor. Try:  Breathing in (inhaling) through your nose for 1 second. Then, pucker your lips and breath out (exhale) through your lips for 2 seconds.  Putting one hand on your belly (abdomen). Breathe in slowly through your nose for 1 second. Your hand on your belly should move out. Pucker your lips and breathe out slowly through your lips. Your hand on your belly should move in as you breathe out.  Learn and use controlled coughing to clear thick spit from your lungs. The steps are: 1. Lean your head a little forward. 2. Breathe  in deeply. 3. Try to hold your breath for 3 seconds. 4. Keep your mouth slightly open while coughing 2 times. 5. Spit any thick spit out into a tissue. 6. Rest and do the steps again 1 or 2 times as needed. GET HELP IF:  You cough up more thick spit than usual.  There is a change in the color or thickness of the spit.  It is harder to breathe than usual.  Your breathing is faster than usual. GET HELP RIGHT AWAY IF:   You have shortness of breath while resting.  You have shortness of breath that stops you from:  Being able to talk.  Doing normal activities.  You chest hurts for longer than 5 minutes.  Your skin color is more blue than usual.  Your pulse oximeter shows that you have low oxygen for longer than 5 minutes. MAKE SURE YOU:   Understand these instructions.  Will watch your condition.  Will get help right away if you are not doing well or get worse. Document Released: 01/09/2008 Document Revised: 12/07/2013 Document Reviewed: 03/19/2013 [employer] Patient Information 2015 [employer]. This information is not intended to replace advice given to you by your health care provider. Make sure you discuss any questions you have with your health care provider.  

## 2014-03-09 LAB — HEPATIC FUNCTION PANEL
ALT: 17 U/L (ref 0–35)
AST: 22 U/L (ref 0–37)
Albumin: 4 g/dL (ref 3.5–5.2)
Alkaline Phosphatase: 83 U/L (ref 39–117)
Bilirubin, Direct: 0.1 mg/dL (ref 0.0–0.3)
Indirect Bilirubin: 0.4 mg/dL (ref 0.2–1.2)
Total Bilirubin: 0.5 mg/dL (ref 0.2–1.2)
Total Protein: 5.9 g/dL — ABNORMAL LOW (ref 6.0–8.3)

## 2014-03-09 LAB — CBC WITH DIFFERENTIAL/PLATELET
Basophils Absolute: 0 K/uL (ref 0.0–0.1)
Basophils Relative: 0 % (ref 0–1)
Eosinophils Absolute: 0.1 K/uL (ref 0.0–0.7)
Eosinophils Relative: 2 % (ref 0–5)
HCT: 41 % (ref 36.0–46.0)
Hemoglobin: 14.1 g/dL (ref 12.0–15.0)
Lymphocytes Relative: 34 % (ref 12–46)
Lymphs Abs: 1.5 K/uL (ref 0.7–4.0)
MCH: 30.7 pg (ref 26.0–34.0)
MCHC: 34.4 g/dL (ref 30.0–36.0)
MCV: 89.1 fL (ref 78.0–100.0)
Monocytes Absolute: 0.2 K/uL (ref 0.1–1.0)
Monocytes Relative: 5 % (ref 3–12)
Neutro Abs: 2.7 K/uL (ref 1.7–7.7)
Neutrophils Relative %: 59 % (ref 43–77)
RBC: 4.6 MIL/uL (ref 3.87–5.11)
RDW: 14.1 % (ref 11.5–15.5)
WBC: 4.5 K/uL (ref 4.0–10.5)

## 2014-03-09 LAB — LIPID PANEL
Cholesterol: 182 mg/dL (ref 0–200)
HDL: 35 mg/dL — ABNORMAL LOW
LDL Cholesterol: 98 mg/dL (ref 0–99)
Total CHOL/HDL Ratio: 5.2 ratio
Triglycerides: 244 mg/dL — ABNORMAL HIGH
VLDL: 49 mg/dL — ABNORMAL HIGH (ref 0–40)

## 2014-03-09 LAB — BASIC METABOLIC PANEL WITHOUT GFR
BUN: 14 mg/dL (ref 6–23)
CO2: 33 meq/L — ABNORMAL HIGH (ref 19–32)
Calcium: 9.3 mg/dL (ref 8.4–10.5)
Chloride: 101 meq/L (ref 96–112)
Creat: 0.89 mg/dL (ref 0.50–1.10)
GFR, Est African American: 78 mL/min
GFR, Est Non African American: 67 mL/min
Glucose, Bld: 81 mg/dL (ref 70–99)
Potassium: 4.3 meq/L (ref 3.5–5.3)
Sodium: 143 meq/L (ref 135–145)

## 2014-03-09 LAB — VITAMIN D 25 HYDROXY (VIT D DEFICIENCY, FRACTURES): VIT D 25 HYDROXY: 92 ng/mL — AB (ref 30–89)

## 2014-03-09 LAB — MAGNESIUM: Magnesium: 1.8 mg/dL (ref 1.5–2.5)

## 2014-03-09 LAB — TSH: TSH: 1.119 u[IU]/mL (ref 0.350–4.500)

## 2014-03-15 ENCOUNTER — Other Ambulatory Visit: Payer: Self-pay | Admitting: *Deleted

## 2014-03-15 ENCOUNTER — Ambulatory Visit: Payer: Self-pay | Admitting: Internal Medicine

## 2014-03-15 DIAGNOSIS — K7460 Unspecified cirrhosis of liver: Secondary | ICD-10-CM

## 2014-03-15 MED ORDER — SPIRONOLACTONE 25 MG PO TABS: 25.0000 mg | ORAL_TABLET | Freq: Two times a day (BID) | ORAL | Status: DC

## 2014-04-05 ENCOUNTER — Other Ambulatory Visit: Payer: Self-pay | Admitting: *Deleted

## 2014-04-05 DIAGNOSIS — K7460 Unspecified cirrhosis of liver: Secondary | ICD-10-CM

## 2014-04-05 MED ORDER — TAMOXIFEN CITRATE 20 MG PO TABS: 20.0000 mg | ORAL_TABLET | Freq: Every day | ORAL | Status: DC

## 2014-04-15 ENCOUNTER — Other Ambulatory Visit: Payer: Self-pay | Admitting: *Deleted

## 2014-04-15 MED ORDER — NADOLOL 40 MG PO TABS: 40.0000 mg | ORAL_TABLET | Freq: Every day | ORAL | Status: DC

## 2014-04-23 ENCOUNTER — Other Ambulatory Visit: Payer: Self-pay | Admitting: *Deleted

## 2014-04-23 MED ORDER — BENZONATATE 100 MG PO CAPS: ORAL_CAPSULE | ORAL | Status: DC

## 2014-04-28 ENCOUNTER — Other Ambulatory Visit: Payer: Self-pay | Admitting: *Deleted

## 2014-04-28 MED ORDER — ACETAMINOPHEN-CODEINE #3 300-30 MG PO TABS: ORAL_TABLET | ORAL | Status: DC

## 2014-04-28 NOTE — Telephone Encounter (Signed)
Refill called to [facility].

## 2014-05-05 ENCOUNTER — Encounter: Payer: Self-pay | Admitting: Gastroenterology

## 2014-05-05 ENCOUNTER — Ambulatory Visit (INDEPENDENT_AMBULATORY_CARE_PROVIDER_SITE_OTHER): Payer: Medicare Other | Admitting: Gastroenterology

## 2014-05-05 VITALS — BP 128/68 | HR 64 | Ht 62.0 in | Wt 148.0 lb

## 2014-05-05 DIAGNOSIS — I8500 Esophageal varices without bleeding: Secondary | ICD-10-CM

## 2014-05-05 DIAGNOSIS — K7460 Unspecified cirrhosis of liver: Secondary | ICD-10-CM

## 2014-05-05 DIAGNOSIS — R1310 Dysphagia, unspecified: Secondary | ICD-10-CM

## 2014-05-05 DIAGNOSIS — E1129 Type 2 diabetes mellitus with other diabetic kidney complication: Secondary | ICD-10-CM

## 2014-05-05 NOTE — Assessment & Plan Note (Signed)
Plan to continue nadolol.  Repeat endoscopy in March, 2016

## 2014-05-05 NOTE — Assessment & Plan Note (Signed)
Stable hepatic function.  Plan repeat LFTs, alpha-fetoprotein and INR in 6 months. Adequate nutrition was emphasized.

## 2014-05-05 NOTE — Progress Notes (Signed)
      History of Present Illness:  [name] has returned following balloon dilation of a distal esophageal stricture.  Grade 2 varices were seen.  Patient no longer has dysphagia.  She has no other specific complaints.    Review of Systems: Pertinent positive and negative review of systems were noted in the above HPI section. All other review of systems were otherwise negative.    Current Medications, Allergies, Past Medical History, Past Surgical History, Family History and Social History were reviewed in [facility] record  Vital signs were reviewed in today's medical record. Physical Exam: General: Well developed , well nourished, no acute distress Skin: anicteric Head: Normocephalic and atraumatic Eyes:  sclerae anicteric, EOMI Ears: Normal auditory acuity Mouth: No deformity or lesions Lungs: Clear throughout to auscultation Heart: Regular rate and rhythm; no murmurs, rubs or bruits Abdomen: Soft, non tender and non distended. No masses, hepatosplenomegaly or hernias noted. Normal Bowel sounds Rectal:deferred Musculoskeletal: Symmetrical with no gross deformities  Pulses:  Normal pulses noted Extremities: No clubbing, cyanosis, edema or deformities noted Neurological: Alert oriented x 4, grossly nonfocal Psychological:  Alert and cooperative. Normal mood and affect  See Assessment and Plan under Problem List

## 2014-05-05 NOTE — Assessment & Plan Note (Signed)
Resolved following dilation of a distal esophageal stricture

## 2014-05-05 NOTE — Patient Instructions (Addendum)
You have been given a separate informational sheet regarding your tobacco use, the importance of quitting and local resources to help you quit. You will need to return in 6 months You will also need follow up labs before your office visit (AFP,INR,CBC,CMET)

## 2014-05-17 ENCOUNTER — Other Ambulatory Visit: Payer: Self-pay | Admitting: Internal Medicine

## 2014-05-24 ENCOUNTER — Other Ambulatory Visit: Payer: Self-pay | Admitting: *Deleted

## 2014-05-24 MED ORDER — AMITRIPTYLINE HCL 10 MG PO TABS: ORAL_TABLET | ORAL | Status: DC

## 2014-06-17 ENCOUNTER — Encounter: Payer: Self-pay | Admitting: Internal Medicine

## 2014-06-17 ENCOUNTER — Ambulatory Visit (INDEPENDENT_AMBULATORY_CARE_PROVIDER_SITE_OTHER): Payer: Medicare Other | Admitting: Internal Medicine

## 2014-06-17 VITALS — BP 132/80 | HR 64 | Temp 98.2°F | Resp 16 | Ht 62.5 in | Wt 153.6 lb

## 2014-06-17 DIAGNOSIS — M545 Low back pain: Secondary | ICD-10-CM

## 2014-06-17 DIAGNOSIS — E559 Vitamin D deficiency, unspecified: Secondary | ICD-10-CM

## 2014-06-17 DIAGNOSIS — I10 Essential (primary) hypertension: Secondary | ICD-10-CM

## 2014-06-17 DIAGNOSIS — Z79899 Other long term (current) drug therapy: Secondary | ICD-10-CM

## 2014-06-17 DIAGNOSIS — Z23 Encounter for immunization: Secondary | ICD-10-CM

## 2014-06-17 DIAGNOSIS — E785 Hyperlipidemia, unspecified: Secondary | ICD-10-CM

## 2014-06-17 DIAGNOSIS — E1122 Type 2 diabetes mellitus with diabetic chronic kidney disease: Secondary | ICD-10-CM

## 2014-06-17 DIAGNOSIS — D485 Neoplasm of uncertain behavior of skin: Secondary | ICD-10-CM

## 2014-06-17 NOTE — Patient Instructions (Signed)

## 2014-06-17 NOTE — Progress Notes (Signed)
Patient ID: [name], female   DOB: 03/27/1946, 68 y.o.   MRN: [id number]   This very nice 68 y.o.MWF presents for 3 month follow up with Hypertension, Hyperlipidemia, T2_NIDDM w/ CKD2 and Vitamin D Deficiency. Other problems include O2 Dependent COPD and NASH/Cirrhosis w/varices.    Patient is treated for HTN & BP has been controlled at home. Today's BP: 132/80 mmHg. Patient has had no complaints of any cardiac type chest pain, palpitations, dyspnea/orthopnea/PND, dizziness, claudication, or dependent edema.   Hyperlipidemia is controlled with diet & meds. Patient denies myalgias or other med SE's. Last Lipids were at goal - Total Chol 182; HDL  35; LDL 98; Triglycerides 244 on  03/08/2014.   Also, the patient has history of T2_NIDDM w/CKD2 and has had no symptoms of reactive hypoglycemia, diabetic polys, paresthesias or visual blurring.  Last A1c was  5.4% on  12/01/2013.   Further, the patient also has history of Vitamin D Deficiency and supplements vitamin D without any suspected side-effects. Last vitamin D was 92 on 03/08/2014.   Medication List   amitriptyline 10 MG tablet  Commonly known as:  ELAVIL  TAKE 1 TABLET BY MOUTH 3 TIMES A DAY AS NEEDED     aspirin 81 MG tablet  Take 81 mg by mouth daily.     benzonatate 100 MG capsule  Commonly known as:  TESSALON  TAKE 1 CAPSULE (100 MG TOTAL) BY MOUTH 3 (THREE) TIMES DAILY AS NEEDED FOR COUGH.     citalopram 40 MG tablet  Commonly known as:  CELEXA  TAKE 1 TABLET BY MOUTH TWICE A  DAY FOR MOOD     cyclobenzaprine 10 MG tablet  Commonly known as:  FLEXERIL  Take 10 mg by mouth 3 (three) times daily as needed for muscle spasms.     ferrous sulfate 325 (65 FE) MG tablet  TAKE 1 TABLET TWICE DAILY     Fish Oil 1000 MG Caps  Take 1,000 mg by mouth 2 (two) times daily.     furosemide 40 MG tablet  Commonly known as:  LASIX  Take 1 tablet (40 mg total) by mouth daily. For fluid     gabapentin 300 MG capsule  Commonly known as:   NEURONTIN  TAKE ONE CAPSULE 3 TIMES A DAY     ipratropium 0.02 % nebulizer solution  Commonly known as:  ATROVENT  Take 2.5 mLs (0.5 mg total) by nebulization every 4 (four) hours as needed for wheezing or shortness of breath.     Magnesium 250 MG Tabs  Take 250 mg by mouth daily at 6 (six) AM.     metFORMIN 500 MG 24 hr tablet  Commonly known as:  GLUCOPHAGE-XR  TAKE 2 TABLETS BY MOUTH TWICE DAILY AFTER MEALS     nadolol 40 MG tablet  Commonly known as:  CORGARD  Take 1 tablet (40 mg total) by mouth daily.     OVER THE COUNTER MEDICATION  100 mg as needed. Stool softner     OVER THE COUNTER MEDICATION  as needed. Natural Veg Laxative     OXYGEN  Inhale 3 L/min into the lungs continuous as needed. On exertion and at home     pantoprazole 40 MG tablet  Commonly known as:  PROTONIX  Take 1 tablet (40 mg total) by mouth 2 (two) times daily.     spironolactone 25 MG tablet  Commonly known as:  ALDACTONE  Take 25 mg by mouth daily. For fluids and swelling  tamoxifen 20 MG tablet  Commonly known as:  NOLVADEX  Take 1 tablet (20 mg total) by mouth daily.     Vitamin D-3 1000 UNITS Caps  Take 5,000 Units by mouth daily.     Allergies  Allergen Reactions  . Atorvastatin   . Diphenhydramine Hcl   . Hydrocodone-Acetaminophen   . Loratadine-Pseudoephedrine Er   . Lorazepam   . Simvastatin   . Sulfonamide Derivatives    PMHx:   Past Medical History  Diagnosis Date  . Hx of shoulder replacement   . Diabetes mellitus     reports "borderline"  . Type II or unspecified type diabetes mellitus without mention of complication, not stated as uncontrolled 08/31/2013  . Hypertension   . Hyperlipidemia   . Asthma   . COPD (chronic obstructive pulmonary disease)   . GERD (gastroesophageal reflux disease)   . Vitamin D deficiency   . IBS (irritable bowel syndrome)   . Fibromyalgia    Immunization History  Administered Date(s) Administered  . Hep A / Hep B 11/05/2013,  11/12/2013, 12/07/2013  . Influenza Split 05/26/2013, 06/17/2014  . Pneumococcal Polysaccharide-23 08/28/2013  . Tdap 11/19/2012   Past Surgical History  Procedure Laterality Date  . Hip arthroplasty Bilateral   . Shoulder surgery Right   . Back surgery    . Cholecystectomy    . Neck surgery    . Esophagogastroduodenoscopy  07/16/2012    Procedure: ESOPHAGOGASTRODUODENOSCOPY (EGD);  Surgeon: Inda Castle, MD;  Location: [facility];  Service: Endoscopy;  Laterality: N/A;  . Gastric varices banding  07/16/2012    Procedure: GASTRIC VARICES BANDING;  Surgeon: Inda Castle, MD;  Location: [facility];  Service: Endoscopy;  Laterality: N/A;   FHx:    Reviewed / unchanged  SHx:    Reviewed / unchanged  Systems Review:  Constitutional: Denies fever, chills, wt changes, headaches, insomnia, fatigue, night sweats, change in appetite. Eyes: Denies redness, blurred vision, diplopia, discharge, itchy, watery eyes.  ENT: Denies discharge, congestion, post nasal drip, epistaxis, sore throat, earache, hearing loss, dental pain, tinnitus, vertigo, sinus pain, snoring.  CV: Denies chest pain, palpitations, irregular heartbeat, syncope, dyspnea, diaphoresis, orthopnea, PND, claudication or edema. Respiratory: denies cough, dyspnea, DOE, pleurisy, hoarseness, laryngitis, wheezing.  Gastrointestinal: Denies dysphagia, odynophagia, heartburn, reflux, water brash, abdominal pain or cramps, nausea, vomiting, bloating, diarrhea, constipation, hematemesis, melena, hematochezia  or hemorrhoids. Genitourinary: Denies dysuria, frequency, urgency, nocturia, hesitancy, discharge, hematuria or flank pain. Musculoskeletal: Denies arthralgias, myalgias, stiffness, jt. swelling, pain, limping or strain/sprain.  Skin: Denies rash, hives, warts, acne, eczema or change in skin lesion(s).(+) pruritic scaly plaque at nape of neck above the scalp line. Neuro: No weakness, tremor, incoordination, spasms, paresthesia  or pain. Psychiatric: Denies confusion, memory loss or sensory loss. Endo: Denies change in weight, skin or hair change.  Heme/Lymph: No excessive bleeding, bruising or enlarged lymph nodes.  Exam:  BP 132/80   Pulse 64  Temp 98.2 F   Resp 16  Ht 5' 2.5"   Wt 153 lb 9.6 oz   BMI 27.63   Appears well nourished and in no distress. Eyes: PERRLA, EOMs, conjunctiva no swelling or erythema. Sinuses: No frontal/maxillary tenderness ENT/Mouth: EAC's clear, TM's nl w/o erythema, bulging. Nares clear w/o erythema, swelling, exudates. Oropharynx clear without erythema or exudates. Oral hygiene is good. Tongue normal, non obstructing. Hearing intact.  Neck: Supple. Thyroid nl. Car 2+/2+ without bruits, nodes or JVD. Chest: Respirations nl with BS very distant, clear & equal w/o rales,  rhonchi, wheezing or stridor.  Cor: Heart sounds normal w/ regular rate and rhythm without sig. murmurs, gallops, clicks, or rubs. Peripheral pulses normal and equal  without edema.  Abdomen: Soft & bowel sounds normal. Non-tender w/o guarding, rebound, hernias, masses, or organomegaly.  Lymphatics: Unremarkable.  Musculoskeletal: Full ROM all peripheral extremities, joint stability, 5/5 strength, and normal gait.  Skin: Warm, dry without exposed rashes or ecchymosis apparent. 10-14 mm scaly plaque type area at nape of neck.  Neuro: Cranial nerves intact, reflexes equal bilaterally. Sensory-motor testing grossly intact. Tendon reflexes grossly intact.  Pysch: Alert & oriented x 3.  Insight and judgement nl & appropriate. No ideations.  Procedure - area at nape of neck suspicious for a picker's type lesion and with informed consent the area was treated with liq. Nitrogen by triple freeze/thaw technique & and patient was advised of wound care .   Assessment and Plan:  1. Hypertension - Continue monitor blood pressure at home. Continue diet/meds same.  2. Hyperlipidemia - Continue diet/meds, exercise,& lifestyle  modifications. Continue monitor periodic cholesterol/liver & renal functions   3. T2_NIDDM w/CKD2 - Continue diet, exercise, lifestyle modifications. Monitor appropriate labs.  4. Vitamin D Deficiency - Continue supplementation.  5. Skin lesion, neck , unknown significance - treated by Cryotherapy  6. COPD, O2 dependent -    Recommended regular exercise, BP monitoring, weight control, and discussed med and SE's. Recommended labs to assess and monitor clinical status. Further disposition pending results of labs.

## 2014-06-18 LAB — BASIC METABOLIC PANEL WITH GFR
BUN: 13 mg/dL (ref 6–23)
CALCIUM: 9.4 mg/dL (ref 8.4–10.5)
CO2: 32 mEq/L (ref 19–32)
Chloride: 105 mEq/L (ref 96–112)
Creat: 0.76 mg/dL (ref 0.50–1.10)
GFR, EST NON AFRICAN AMERICAN: 81 mL/min
Glucose, Bld: 88 mg/dL (ref 70–99)
POTASSIUM: 4.5 meq/L (ref 3.5–5.3)
SODIUM: 143 meq/L (ref 135–145)

## 2014-06-18 LAB — HEPATIC FUNCTION PANEL
ALT: 20 U/L (ref 0–35)
AST: 25 U/L (ref 0–37)
Albumin: 3.9 g/dL (ref 3.5–5.2)
Alkaline Phosphatase: 91 U/L (ref 39–117)
BILIRUBIN DIRECT: 0.1 mg/dL (ref 0.0–0.3)
BILIRUBIN INDIRECT: 0.4 mg/dL (ref 0.2–1.2)
Total Bilirubin: 0.5 mg/dL (ref 0.2–1.2)
Total Protein: 6 g/dL (ref 6.0–8.3)

## 2014-06-18 LAB — CBC WITH DIFFERENTIAL/PLATELET
BASOS PCT: 0 % (ref 0–1)
Basophils Absolute: 0 10*3/uL (ref 0.0–0.1)
EOS ABS: 0.1 10*3/uL (ref 0.0–0.7)
Eosinophils Relative: 3 % (ref 0–5)
HCT: 40 % (ref 36.0–46.0)
Hemoglobin: 13.3 g/dL (ref 12.0–15.0)
Lymphocytes Relative: 43 % (ref 12–46)
Lymphs Abs: 1.5 10*3/uL (ref 0.7–4.0)
MCH: 30.9 pg (ref 26.0–34.0)
MCHC: 33.3 g/dL (ref 30.0–36.0)
MCV: 92.8 fL (ref 78.0–100.0)
Monocytes Absolute: 0.2 10*3/uL (ref 0.1–1.0)
Monocytes Relative: 6 % (ref 3–12)
NEUTROS PCT: 48 % (ref 43–77)
Neutro Abs: 1.7 10*3/uL (ref 1.7–7.7)
Platelets: 72 10*3/uL — ABNORMAL LOW (ref 150–400)
RBC: 4.31 MIL/uL (ref 3.87–5.11)
RDW: 14.3 % (ref 11.5–15.5)
WBC: 3.5 10*3/uL — ABNORMAL LOW (ref 4.0–10.5)

## 2014-06-18 LAB — MAGNESIUM: MAGNESIUM: 2.3 mg/dL (ref 1.5–2.5)

## 2014-06-18 LAB — INSULIN, FASTING: Insulin fasting, serum: 23.2 u[IU]/mL — ABNORMAL HIGH (ref 2.0–19.6)

## 2014-06-18 LAB — LIPID PANEL
CHOL/HDL RATIO: 6.4 ratio
Cholesterol: 216 mg/dL — ABNORMAL HIGH (ref 0–200)
HDL: 34 mg/dL — ABNORMAL LOW (ref >39)
TRIGLYCERIDES: 526 mg/dL — AB (ref <150)

## 2014-06-18 LAB — TSH: TSH: 1.123 u[IU]/mL (ref 0.350–4.500)

## 2014-06-18 LAB — HEMOGLOBIN A1C
Hgb A1c MFr Bld: 5.3 % (ref <5.7)
MEAN PLASMA GLUCOSE: 105 mg/dL (ref <117)

## 2014-06-18 LAB — VITAMIN D 25 HYDROXY (VIT D DEFICIENCY, FRACTURES): Vit D, 25-Hydroxy: 79 ng/mL (ref 30–89)

## 2014-06-29 ENCOUNTER — Telehealth: Payer: Self-pay

## 2014-06-29 NOTE — Telephone Encounter (Signed)
Patient's spouse called requesting recent lab results. Results need to be faxed to [facility]. Patient is there now for an appointment. Faxed records to [phone].

## 2014-08-09 ENCOUNTER — Other Ambulatory Visit: Payer: Self-pay | Admitting: *Deleted

## 2014-08-09 MED ORDER — IPRATROPIUM BROMIDE 0.02 % IN SOLN: 0.5000 mg | RESPIRATORY_TRACT | Status: DC | PRN

## 2014-08-16 ENCOUNTER — Telehealth: Payer: Self-pay | Admitting: *Deleted

## 2014-08-16 NOTE — Telephone Encounter (Signed)
CALL PT'S HUSBAND IF ANY QUESTIONS ON HOME OR CELL Pt needs a referral to [facility]  Dr Cleon Gustin MD  08/20/14  At 12:30 pm  [place] Pt has a new pt appointment to be eval for pain & treatment  Ph [phone]

## 2014-08-16 NOTE — Telephone Encounter (Signed)
Pt has appt with [facility] 08/25/13 follow up & [facility]  Dr Junious Silk needs referral

## 2014-08-19 ENCOUNTER — Telehealth: Payer: Self-pay

## 2014-08-19 NOTE — Telephone Encounter (Signed)
Left message for patient's husband to let him know that referrals requested were submitted to [facility]

## 2014-08-23 ENCOUNTER — Other Ambulatory Visit: Payer: Self-pay | Admitting: Internal Medicine

## 2014-08-31 ENCOUNTER — Other Ambulatory Visit: Payer: Self-pay | Admitting: Internal Medicine

## 2014-09-03 ENCOUNTER — Emergency Department (HOSPITAL_COMMUNITY): Payer: PPO

## 2014-09-03 ENCOUNTER — Emergency Department (HOSPITAL_COMMUNITY)
Admission: EM | Admit: 2014-09-03 | Discharge: 2014-09-03 | Disposition: A | Discharge status: Home / Self Care | Payer: PPO | Attending: Emergency Medicine | Admitting: Emergency Medicine

## 2014-09-03 ENCOUNTER — Encounter (HOSPITAL_COMMUNITY): Payer: Self-pay | Admitting: Emergency Medicine

## 2014-09-03 DIAGNOSIS — R55 Syncope and collapse: Secondary | ICD-10-CM

## 2014-09-03 DIAGNOSIS — Y998 Other external cause status: Secondary | ICD-10-CM | POA: Insufficient documentation

## 2014-09-03 DIAGNOSIS — E559 Vitamin D deficiency, unspecified: Secondary | ICD-10-CM | POA: Diagnosis not present

## 2014-09-03 DIAGNOSIS — Z96619 Presence of unspecified artificial shoulder joint: Secondary | ICD-10-CM | POA: Insufficient documentation

## 2014-09-03 DIAGNOSIS — I10 Essential (primary) hypertension: Secondary | ICD-10-CM | POA: Insufficient documentation

## 2014-09-03 DIAGNOSIS — Z720 Tobacco use: Secondary | ICD-10-CM | POA: Diagnosis not present

## 2014-09-03 DIAGNOSIS — K219 Gastro-esophageal reflux disease without esophagitis: Secondary | ICD-10-CM | POA: Diagnosis not present

## 2014-09-03 DIAGNOSIS — Z79899 Other long term (current) drug therapy: Secondary | ICD-10-CM | POA: Diagnosis not present

## 2014-09-03 DIAGNOSIS — Z7982 Long term (current) use of aspirin: Secondary | ICD-10-CM | POA: Diagnosis not present

## 2014-09-03 DIAGNOSIS — S59911A Unspecified injury of right forearm, initial encounter: Secondary | ICD-10-CM | POA: Diagnosis not present

## 2014-09-03 DIAGNOSIS — S060X9A Concussion with loss of consciousness of unspecified duration, initial encounter: Secondary | ICD-10-CM | POA: Diagnosis not present

## 2014-09-03 DIAGNOSIS — E119 Type 2 diabetes mellitus without complications: Secondary | ICD-10-CM | POA: Insufficient documentation

## 2014-09-03 DIAGNOSIS — W19XXXA Unspecified fall, initial encounter: Secondary | ICD-10-CM

## 2014-09-03 DIAGNOSIS — Y92481 Parking lot as the place of occurrence of the external cause: Secondary | ICD-10-CM | POA: Insufficient documentation

## 2014-09-03 DIAGNOSIS — S4991XA Unspecified injury of right shoulder and upper arm, initial encounter: Secondary | ICD-10-CM

## 2014-09-03 DIAGNOSIS — Y9301 Activity, walking, marching and hiking: Secondary | ICD-10-CM | POA: Insufficient documentation

## 2014-09-03 DIAGNOSIS — J449 Chronic obstructive pulmonary disease, unspecified: Secondary | ICD-10-CM | POA: Insufficient documentation

## 2014-09-03 DIAGNOSIS — W01198A Fall on same level from slipping, tripping and stumbling with subsequent striking against other object, initial encounter: Secondary | ICD-10-CM | POA: Insufficient documentation

## 2014-09-03 DIAGNOSIS — S59901A Unspecified injury of right elbow, initial encounter: Secondary | ICD-10-CM | POA: Diagnosis present

## 2014-09-03 LAB — CBC
HEMATOCRIT: 43.3 % (ref 36.0–46.0)
HEMOGLOBIN: 14.1 g/dL (ref 12.0–15.0)
MCH: 31.3 pg (ref 26.0–34.0)
MCHC: 32.6 g/dL (ref 30.0–36.0)
MCV: 96.2 fL (ref 78.0–100.0)
PLATELETS: 64 10*3/uL — AB (ref 150–400)
RBC: 4.5 MIL/uL (ref 3.87–5.11)
RDW: 13.1 % (ref 11.5–15.5)
WBC: 3.4 10*3/uL — AB (ref 4.0–10.5)

## 2014-09-03 LAB — BASIC METABOLIC PANEL
Anion gap: 11 (ref 5–15)
BUN: 14 mg/dL (ref 6–23)
CHLORIDE: 102 mmol/L (ref 96–112)
CO2: 28 mmol/L (ref 19–32)
Calcium: 9.2 mg/dL (ref 8.4–10.5)
Creatinine, Ser: 0.87 mg/dL (ref 0.50–1.10)
GFR calc non Af Amer: 67 mL/min — ABNORMAL LOW (ref >90)
GFR, EST AFRICAN AMERICAN: 78 mL/min — AB (ref >90)
Glucose, Bld: 132 mg/dL — ABNORMAL HIGH (ref 70–99)
Potassium: 3.5 mmol/L (ref 3.5–5.1)
Sodium: 141 mmol/L (ref 135–145)

## 2014-09-03 LAB — CBG MONITORING, ED: Glucose-Capillary: 103 mg/dL — ABNORMAL HIGH (ref 70–99)

## 2014-09-03 MED ORDER — ACETAMINOPHEN-CODEINE #3 300-30 MG PO TABS
1.0000 | ORAL_TABLET | Freq: Once | ORAL | Status: AC
Start: 2014-09-03 — End: 2014-09-03
  Administered 2014-09-03: 1 via ORAL
  Filled 2014-09-03: qty 1

## 2014-09-03 NOTE — ED Notes (Signed)
Pt was able to walk around in her room with stand by assist

## 2014-09-03 NOTE — ED Provider Notes (Signed)
Medical screening examination/treatment/procedure(s) were conducted as a shared visit with non-physician practitioner(s) and myself.  I personally evaluated the patient during the encounter.  Pt states she lost her balance and fell striking her right elbow and her head.  Complains primarily of elbow pain, as well as right shoulder and wrist pain.   Physical Exam  BP 144/55 mmHg  Pulse 71  Temp(Src) 97.6 F (36.4 C) (Oral)  Resp 18  SpO2 100%  Physical Exam  Constitutional: She appears well-developed and well-nourished. No distress.  HENT:  Head: Normocephalic and atraumatic.  Right Ear: External ear normal.  Left Ear: External ear normal.  Eyes: Conjunctivae are normal. Right eye exhibits no discharge. Left eye exhibits no discharge. No scleral icterus.  Neck: Neck supple. No tracheal deviation present.  Cardiovascular: Normal rate.   Pulmonary/Chest: Effort normal. No stridor. No respiratory distress.  Musculoskeletal: She exhibits no edema.       Right shoulder: She exhibits tenderness. She exhibits no swelling and no deformity.       Right elbow: She exhibits no laceration. Tenderness found.       Right wrist: She exhibits tenderness and bony tenderness. She exhibits no swelling.  Neurological: She is alert. Cranial nerve deficit: no gross deficits.  Skin: Skin is warm and dry. No rash noted.  Psychiatric: She has a normal mood and affect.  Nursing note and vitals reviewed.   ED Course  Procedures  EKG Interpretation  Date/Time:  Friday September 03 2014 15:49:09 EST Ventricular Rate:  74 PR Interval:  141 QRS Duration: 141 QT Interval:  453 QTC Calculation: 503 R Axis:   100 Text Interpretation:  Sinus rhythm RBBB and LPFB No significant change since last tracing Confirmed by Chantale Leugers  MD-J, Breckon Reeves ([id number]) on 09/03/2014 4:05:31 PM       MDM Pt states she has trouble with her balance chronically.  She uses a walker and has a wheelchair at home.  She was not using that  today nor her oxygen.  Pt does not want to be admitted to the hospital.  Her husband will be with her at home.      Dorie Rank, MD 09/03/14 [phone]

## 2014-09-03 NOTE — Discharge Instructions (Signed)
Acromioclavicular Injuries The AC (acromioclavicular) joint is the joint in the shoulder where the collarbone (clavicle) meets the shoulder blade (scapula). The part of the shoulder blade connected to the collarbone is called the acromion. Common problems with and treatments for the [facility] joint are detailed below. ARTHRITIS Arthritis occurs when the joint has been injured and the smooth padding between the joints (cartilage) is lost. This is the wear and tear seen in most joints of the body if they have been overused. This causes the joint to produce pain and swelling which is worse with activity.  AC JOINT SEPARATION AC joint separation means that the ligaments connecting the acromion of the shoulder blade and collarbone have been damaged, and the two bones no longer line up. AC separations can be anywhere from mild to severe, and are "graded" depending upon which ligaments are torn and how badly they are torn.  Grade I Injury: the least damage is done, and the [facility] joint still lines up.  Grade II Injury: damage to the ligaments which reinforce the [facility] joint. In a Grade II injury, these ligaments are stretched but not entirely torn. When stressed, the [facility] joint becomes painful and unstable.  Grade III Injury: AC and secondary ligaments are completely torn, and the collarbone is no longer attached to the shoulder blade. This results in deformity; a prominence of the end of the clavicle. AC JOINT FRACTURE AC joint fracture means that there has been a break in the bones of the [facility] joint, usually the end of the clavicle. TREATMENT TREATMENT OF AC ARTHRITIS  There is currently no way to replace the cartilage damaged by arthritis. The best way to improve the condition is to decrease the activities which aggravate the problem. Application of ice to the joint helps decrease pain and soreness (inflammation). The use of non-steroidal anti-inflammatory medication is helpful.  If less conservative measures do not  work, then cortisone shots (injections) may be used. These are anti-inflammatories; they decrease the soreness in the joint and swelling.  If non-surgical measures fail, surgery may be recommended. The procedure is generally removal of a portion of the end of the clavicle. This is the part of the collarbone closest to your acromion which is stabilized with ligaments to the acromion of the shoulder blade. This surgery may be performed using a tube-like instrument with a light (arthroscope) for looking into a joint. It may also be performed as an open surgery through a small incision by the surgeon. Most patients will have good range of motion within 6 weeks and may return to all activity including sports by 8-12 weeks, barring complications. TREATMENT OF AN AC SEPARATION  The initial treatment is to decrease pain. This is best accomplished by immobilizing the arm in a sling and placing an ice pack to the shoulder for 20 to 30 minutes every 2 hours as needed. As the pain starts to subside, it is important to begin moving the fingers, wrist, elbow and eventually the shoulder in order to prevent a stiff or "frozen" shoulder. Instruction on when and how much to move the shoulder will be provided by your caregiver. The length of time needed to regain full motion and function depends on the amount or grade of the injury. Recovery from a Grade I AC separation usually takes 10 to 14 days, whereas a Grade III may take 6 to 8 weeks.  Grade I and II separations usually do not require surgery. Even Grade III injuries usually allow return to full  activity with few restrictions. Treatment is also based on the activity demands of the injured shoulder. For example, a high level quarterback with an injured throwing arm will receive more aggressive treatment than someone with a desk job who rarely uses his/her arm for strenuous activities. In some cases, a painful lump may persist which could require a later surgery. Surgery  can be very successful, but the benefits must be weighed against the potential risks. TREATMENT OF AN AC JOINT FRACTURE Fracture treatment depends on the type of fracture. Sometimes a splint or sling may be all that is required. Other times surgery may be required for repair. This is more frequently the case when the ligaments supporting the clavicle are completely torn. Your caregiver will help you with these decisions and together you can decide what will be the best treatment. HOME CARE INSTRUCTIONS   Apply ice to the injury for 15-20 minutes each hour while awake for 2 days. Put the ice in a plastic bag and place a towel between the bag of ice and skin.  If a sling has been applied, wear it constantly for as long as directed by your caregiver, even at night. The sling or splint can be removed for bathing or showering or as directed. Be sure to keep the shoulder in the same place as when the sling is on. Do not lift the arm.  If a figure-of-eight splint has been applied it should be tightened gently by another person every day. Tighten it enough to keep the shoulders held back. Allow enough room to place the index finger between the body and strap. Loosen the splint immediately if there is numbness or tingling in the hands.  Take over-the-counter or prescription medicines for pain, discomfort or fever as directed by your caregiver.  If you or your child has received a follow up appointment, it is very important to keep that appointment in order to avoid long term complications, chronic pain or disability. SEEK MEDICAL CARE IF:   The pain is not relieved with medications.  There is increased swelling or discoloration that continues to get worse rather than better.  You or your child has been unable to follow up as instructed.  There is progressive numbness and tingling in the arm, forearm or hand. SEEK IMMEDIATE MEDICAL CARE IF:   The arm is numb, cold or pale.  There is increasing pain  in the hand, forearm or fingers. MAKE SURE YOU:   Understand these instructions.  Will watch your condition.  Will get help right away if you are not doing well or get worse. Document Released: 05/02/2005 Document Revised: 10/15/2011 Document Reviewed: 10/25/2008 [facility] Patient Information 2015 [place], [place]. This information is not intended to replace advice given to you by your health care provider. Make sure you discuss any questions you have with your health care provider.  Fall Prevention and Home Safety Falls cause injuries and can affect all age groups. It is possible to use preventive measures to significantly decrease the likelihood of falls. There are many simple measures which can make your home safer and prevent falls. OUTDOORS  Repair cracks and edges of walkways and driveways.  Remove high doorway thresholds.  Trim shrubbery on the main path into your home.  Have good outside lighting.  Clear walkways of tools, rocks, debris, and clutter.  Check that handrails are not broken and are securely fastened. Both sides of steps should have handrails.  Have leaves, snow, and ice cleared regularly.  Use sand  or salt on walkways during winter months.  In the garage, clean up grease or oil spills. BATHROOM  Install night lights.  Install grab bars by the toilet and in the tub and shower.  Use non-skid mats or decals in the tub or shower.  Place a plastic non-slip stool in the shower to sit on, if needed.  Keep floors dry and clean up all water on the floor immediately.  Remove soap buildup in the tub or shower on a regular basis.  Secure bath mats with non-slip, double-sided rug tape.  Remove throw rugs and tripping hazards from the floors. BEDROOMS  Install night lights.  Make sure a bedside light is easy to reach.  Do not use oversized bedding.  Keep a telephone by your bedside.  Have a firm chair with side arms to use for getting dressed.  Remove  throw rugs and tripping hazards from the floor. KITCHEN  Keep handles on pots and pans turned toward the center of the stove. Use back burners when possible.  Clean up spills quickly and allow time for drying.  Avoid walking on wet floors.  Avoid hot utensils and knives.  Position shelves so they are not too high or low.  Place commonly used objects within easy reach.  If necessary, use a sturdy step stool with a grab bar when reaching.  Keep electrical cables out of the way.  Do not use floor polish or wax that makes floors slippery. If you must use wax, use non-skid floor wax.  Remove throw rugs and tripping hazards from the floor. STAIRWAYS  Never leave objects on stairs.  Place handrails on both sides of stairways and use them. Fix any loose handrails. Make sure handrails on both sides of the stairways are as long as the stairs.  Check carpeting to make sure it is firmly attached along stairs. Make repairs to worn or loose carpet promptly.  Avoid placing throw rugs at the top or bottom of stairways, or properly secure the rug with carpet tape to prevent slippage. Get rid of throw rugs, if possible.  Have an electrician put in a light switch at the top and bottom of the stairs. OTHER FALL PREVENTION TIPS  Wear low-heel or rubber-soled shoes that are supportive and fit well. Wear closed toe shoes.  When using a stepladder, make sure it is fully opened and both spreaders are firmly locked. Do not climb a closed stepladder.  Add color or contrast paint or tape to grab bars and handrails in your home. Place contrasting color strips on first and last steps.  Learn and use mobility aids as needed. Install an electrical emergency response system.  Turn on lights to avoid dark areas. Replace light bulbs that burn out immediately. Get light switches that glow.  Arrange furniture to create clear pathways. Keep furniture in the same place.  Firmly attach carpet with non-skid or  double-sided tape.  Eliminate uneven floor surfaces.  Select a carpet pattern that does not visually hide the edge of steps.  Be aware of all pets. OTHER HOME SAFETY TIPS  Set the water temperature for 120 F (48.8 C).  Keep emergency numbers on or near the telephone.  Keep smoke detectors on every level of the home and near sleeping areas. Document Released: 07/13/2002 Document Revised: 01/22/2012 Document Reviewed: 10/12/2011 [facility] Patient Information 2015 [place], [place]. This information is not intended to replace advice given to you by your health care provider. Make sure you discuss any questions you  have with your health care provider.

## 2014-09-03 NOTE — ED Notes (Signed)
Patient transported to X-ray 

## 2014-09-03 NOTE — ED Notes (Signed)
Patient transported to CT 

## 2014-09-03 NOTE — Progress Notes (Signed)
CSW met with patient at bedside. Husband was present. Patient confirms that she presents to the ED department tonight due to fall. Patient says that her fall was caused by poor balance. Patient informed CSW that she fell on her right side. Patient appeared to have a laceration on her right elbow.   According to husband, patient has received 2 neck operations about 2 years ago. Also, he states the patient does not have good balance.  Patient says that her husband often assist her with completing ADL's. Patient informed CSW that she feels a little better than when she initially came into the ED.   Husband/James Shelp [phone]  [name] [phone] ED CSW 09/03/2014 6:01 PM

## 2014-09-03 NOTE — ED Provider Notes (Signed)
CSN: [id number]     Arrival date & time 09/03/14  1506 History   First MD Initiated Contact with Patient 09/03/14 1534     Chief Complaint  Patient presents with  . Fall  . Loss of Consciousness  . Elbow Injury     (Consider location/radiation/quality/duration/timing/severity/associated sxs/prior Treatment) HPI  PCP: MCKEOWN,WILLIAM DAVID, MD Blood pressure 144/55, pulse 74, temperature 97.6 F (36.4 C), temperature source Oral, resp. rate 16, SpO2 95 %.  KERRIGAN GOMBOS is a 69 y.o.female with a significant PMH of COPD (on 3 L nasal cannula at home), diabetes, hypertension, asthma, GERD, IBS, fibromyalgia, hip and shoulder replacements presents to the ER with complaints of fall, head injury with LOC. She was on Gabapentin in the past and told to take 3 pills at one time, due to dizziness it was changed back to 2 pills at a time which she reports tolerating well. A new doctor at [facility] instructed her to bump up to 4 pills PO 1 week ago. Due to the weather the patient has not been out and about in the past week. She went out today and had done a lot of walking, she did not take her oxygen tank with her which she is supposed to be on at all times. She felt very dizzy, reports walking to the side, and fell over hitting her head and landing on her right arm. She reports passing out for unknown amount of time- no one that witnessed the incident is present. EMS on arrival picked her up off the ground. She denies having any neck pain or any complaints currently aside from right arm pain. No dizziness when laying at rest.  Denies head injury, headache, worsened back pain, weakness (generalized or focal), chest pain, abdominal pain, loss of bowel or urine control, change in vision, intra oral injury//pain    Past Medical History  Diagnosis Date  . Hx of shoulder replacement   . Diabetes mellitus     reports "borderline"  . Type II or unspecified type diabetes mellitus without mention of  complication, not stated as uncontrolled 08/31/2013  . Hypertension   . Hyperlipidemia   . Asthma   . COPD (chronic obstructive pulmonary disease)   . GERD (gastroesophageal reflux disease)   . Vitamin D deficiency   . IBS (irritable bowel syndrome)   . Fibromyalgia    Past Surgical History  Procedure Laterality Date  . Hip arthroplasty Bilateral   . Shoulder surgery Right   . Back surgery    . Cholecystectomy    . Neck surgery    . Esophagogastroduodenoscopy  07/16/2012    Procedure: ESOPHAGOGASTRODUODENOSCOPY (EGD);  Surgeon: Inda Castle, MD;  Location: [facility];  Service: Endoscopy;  Laterality: N/A;  . Gastric varices banding  07/16/2012    Procedure: GASTRIC VARICES BANDING;  Surgeon: Inda Castle, MD;  Location: [facility];  Service: Endoscopy;  Laterality: N/A;   Family History  Problem Relation Age of Onset  . Colon cancer Neg Hx   . Diabetes Brother   . Heart disease Sister     A Fib  . Heart disease Mother    History  Substance Use Topics  . Smoking status: Current Every Day Smoker -- 1.00 packs/day for 50 years    Types: Cigarettes  . Smokeless tobacco: Never Used     Comment: Tobacco info given 10/21/13  . Alcohol Use: Yes     Comment: rarely   OB History    No data available  Review of Systems  10 Systems reviewed and are negative for acute change except as noted in the HPI.     Allergies  Atorvastatin; Diphenhydramine hcl; Hydrocodone-acetaminophen; Loratadine-pseudoephedrine er; Lorazepam; Simvastatin; and Sulfonamide derivatives  Home Medications   Prior to Admission medications   Medication Sig Start Date End Date Taking? Authorizing Provider  acetaminophen-codeine (TYLENOL #3) 300-30 MG per tablet Take 1/2 to 1 tablet up to 3 to 4  X day as needed for severe pain 04/28/14  Yes Unk Pinto, MD  amitriptyline (ELAVIL) 10 MG tablet TAKE 1 TABLET BY MOUTH 3 TIMES A DAY AS NEEDED 05/24/14  Yes Unk Pinto, MD  aspirin 81 MG  tablet Take 81 mg by mouth daily.   Yes Historical Provider, MD  benzonatate (TESSALON) 100 MG capsule TAKE 1 CAPSULE (100 MG TOTAL) BY MOUTH 3 (THREE) TIMES DAILY AS NEEDED FOR COUGH. 04/23/14  Yes Unk Pinto, MD  Cholecalciferol (VITAMIN D-3) 1000 UNITS CAPS Take 5,000 Units by mouth daily.   Yes Historical Provider, MD  citalopram (CELEXA) 40 MG tablet TAKE 1 TABLET BY MOUTH TWICE A  DAY FOR MOOD 10/22/13  Yes Vicie Mutters, PA-C  cyclobenzaprine (FLEXERIL) 10 MG tablet Take 10 mg by mouth 3 (three) times daily as needed for muscle spasms.   Yes Historical Provider, MD  ferrous sulfate 325 (65 FE) MG tablet TAKE 1 TABLET TWICE DAILY Patient taking differently: TAKE 1 TABLET ONCE DAILY 01/19/13  Yes Inda Castle, MD  furosemide (LASIX) 40 MG tablet Take 1 tablet (40 mg total) by mouth daily. For fluid 01/29/14 01/30/15 Yes Unk Pinto, MD  gabapentin (NEURONTIN) 300 MG capsule TAKE ONE CAPSULE 3 TIMES A DAY Patient taking differently: TAKE ONE CAPSULE 4 TIMES A DAY 11/02/13  Yes Vicie Mutters, PA-C  ipratropium (ATROVENT) 0.02 % nebulizer solution Take 2.5 mLs (0.5 mg total) by nebulization every 4 (four) hours as needed for wheezing or shortness of breath. 08/09/14 08/09/15 Yes Unk Pinto, MD  Magnesium 250 MG TABS Take 250 mg by mouth daily at 6 (six) AM.   Yes Historical Provider, MD  metFORMIN (GLUCOPHAGE-XR) 500 MG 24 hr tablet TAKE 2 TABLETS BY MOUTH TWICE DAILY AFTER MEALS 05/17/14  Yes Unk Pinto, MD  nadolol (CORGARD) 40 MG tablet Take 1 tablet (40 mg total) by mouth daily. 04/15/14  Yes Unk Pinto, MD  Omega-3 Fatty Acids (FISH OIL) 1000 MG CAPS Take 1,000 mg by mouth 2 (two) times daily.   Yes Historical Provider, MD  OVER THE COUNTER MEDICATION Take 1-2 tablets by mouth as needed (constipation). Natural Veg Laxative   Yes Historical Provider, MD  OXYGEN Inhale 3 L/min into the lungs continuous as needed. On exertion and at home   Yes Historical Provider, MD   pantoprazole (PROTONIX) 40 MG tablet Take 1 tablet (40 mg total) by mouth 2 (two) times daily. 08/31/14  Yes Unk Pinto, MD  spironolactone (ALDACTONE) 25 MG tablet Take 1 tablet (25 mg total) by mouth 2 (two) times daily. For fluids and swelling Patient taking differently: Take 1 tablet (25 mg total) by mouth once daily. 08/31/14  Yes Unk Pinto, MD  tamoxifen (NOLVADEX) 20 MG tablet Take 1 tablet (20 mg total) by mouth daily. 04/05/14  Yes Unk Pinto, MD   BP 130/61 mmHg  Pulse 71  Temp(Src) 97.6 F (36.4 C) (Oral)  Resp 18  SpO2 100% Physical Exam  Constitutional: She is oriented to person, place, and time. She appears well-developed and well-nourished. No distress.  HENT:  Head: Normocephalic and atraumatic. Head is without raccoon's eyes, without Battle's sign, without abrasion, without right periorbital erythema and without left periorbital erythema.  No contusion noted to posterior scalp.  Eyes: Pupils are equal, round, and reactive to light.  Neck: Normal range of motion. Neck supple. No spinous process tenderness and no muscular tenderness present. Normal range of motion present.  Cardiovascular: Normal rate and regular rhythm.   Pulmonary/Chest: Effort normal. She exhibits no tenderness and no crepitus.  Abdominal: Soft.  Musculoskeletal:       Right elbow: She exhibits decreased range of motion and swelling. She exhibits no deformity and no laceration. Tenderness (diffuse) found.  Small abrasion noted to lateral elbow.  Neurological: She is alert and oriented to person, place, and time.  Cranial nerves II-VIII and X-XII evaluated and show no deficits. Pt alert and oriented x 3 Upper and lower extremity strength is symmetrical and at her baseline Normal muscular tone No facial droop Coordination intact, no limb ataxia No pronator drift  Skin: Skin is warm and dry.  Nursing note and vitals reviewed.   ED Course  Procedures (including critical care  time) Labs Review Labs Reviewed  CBC - Abnormal; Notable for the following:    WBC 3.4 (*)    Platelets 64 (*)    All other components within normal limits  BASIC METABOLIC PANEL - Abnormal; Notable for the following:    Glucose, Bld 132 (*)    GFR calc non Af Amer 67 (*)    GFR calc Af Amer 78 (*)    All other components within normal limits  CBG MONITORING, ED - Abnormal; Notable for the following:    Glucose-Capillary 103 (*)    All other components within normal limits    Imaging Review Dg Shoulder Right  09/03/2014   CLINICAL DATA:  Pt walking to car at [employer] parking lot and unsteady from new drug she was just started. Pt fell to the ground and hit head and hurt right elbow. Loss of Consciousness for unknown amount of time. Pt alert and oriented at this time. 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 Hx right shoulder surgery. Pt c/o of posterior right wrist pain all across and right shoulder pain. Unable to obtain axillary shoulder view due to pt condition.  EXAM: RIGHT SHOULDER - 2+ VIEW  COMPARISON:  None.  FINDINGS: No acute fracture or dislocation. Right shoulder prosthesis appears well seated and aligned. Bones are diffusely demineralized. Soft tissues are unremarkable.  IMPRESSION: No acute fracture or dislocation. No disruption of the right shoulder prosthesis.   Electronically Signed   By: Lajean Manes M.D.   On: 09/03/2014 18:21   Dg Elbow Complete Right  09/03/2014   CLINICAL DATA:  Right elbow laceration after fall in parking lot. Initial encounter.  EXAM: RIGHT ELBOW - COMPLETE 3+ VIEW  COMPARISON:  January 25, 2012.  FINDINGS: There is no evidence of fracture, dislocation, or joint effusion. There is no evidence of arthropathy or other focal bone abnormality. Soft tissues are unremarkable.  IMPRESSION: No acute abnormality seen in the right elbow.   Electronically Signed   By: Sabino Dick M.D.   On: 09/03/2014 16:16   Dg Wrist Complete Right  09/03/2014    CLINICAL DATA:  69 year old female with history of trauma from a fall with injury to the right wrist complaining of right wrist pain.  EXAM: RIGHT WRIST - COMPLETE 3+ VIEW  COMPARISON:  No priors.  FINDINGS: Study  is severely limited by atypical positioning of the patient for this examination. With these limitations in mind, no definite acute displaced fracture, subluxation or dislocation is noted. Mild multifocal joint space narrowing and subchondral sclerosis is noted throughout the carpals, compatible with osteoarthritis.  IMPRESSION: 1. No acute radiographic abnormality of the right wrist.   Electronically Signed   By: Vinnie Langton M.D.   On: 09/03/2014 18:23   Ct Head Wo Contrast  09/03/2014   CLINICAL DATA:  69 year old female with acute onset of dizziness and fall with injury to the back the head earlier today.  EXAM: CT HEAD WITHOUT CONTRAST  TECHNIQUE: Contiguous axial images were obtained from the base of the skull through the vertex without intravenous contrast.  COMPARISON:  No priors.  FINDINGS: No acute intracranial abnormalities. Specifically, no evidence of acute intracranial hemorrhage, no definite findings of acute/subacute cerebral ischemia, no mass, mass effect, hydrocephalus or abnormal intra or extra-axial fluid collections. Visualized paranasal sinuses and mastoids are well pneumatized. No acute displaced skull fractures are identified.  IMPRESSION: *No acute intracranial abnormalities. *The appearance of the brain is normal.   Electronically Signed   By: Vinnie Langton M.D.   On: 09/03/2014 16:22     EKG Interpretation   Date/Time:  Friday September 03 2014 15:49:09 EST Ventricular Rate:  74 PR Interval:  141 QRS Duration: 141 QT Interval:  453 QTC Calculation: 503 R Axis:   100 Text Interpretation:  Sinus rhythm RBBB and LPFB No significant change  since last tracing Confirmed by KNAPP  MD-J, JON ([id number]) on 09/03/2014  4:05:31 PM      MDM   Final diagnoses:   Fall, initial encounter  Syncope, non cardiac  Arm injury, right, initial encounter   The patient has a reason for the dizziness which is her Gabapentin was recently increased which causes dizziness and she was without her oxygen today. She is no longer symptomatic. I recommend she drop down to 2 tabs at one time.  Her labs, head CT and right arm xrays are reassuring. She was ambulated in the ED by nursing staff and had no difficulty or ataxia. She wishes to go home, she will f/u  With PCP.  The patient denies any symptoms of neurological impairment or TIA's; no amaurosis, diplopia, dysphasia, or unilateral disturbance of motor or sensory function. No loss of balance or vertigo.  The patient has been seen by  Dorie Rank, MD. My supervising physician agrees with the work-up and diagnosis. The ED Physician feels comfortable with the patients disposition and treatment plan.   69 y.o.Alda Gaultney Napp's evaluation in the Emergency Department is complete. It has been determined that no acute conditions requiring further emergency intervention are present at this time. The patient/guardian have been advised of the diagnosis and plan. We have discussed signs and symptoms that warrant return to the ED, such as changes or worsening in symptoms.  Vital signs are stable at discharge. Filed Vitals:   09/03/14 1856  BP: 130/61  Pulse: 71  Temp:   Resp: 18    Patient/guardian has voiced understanding and agreed to follow-up with the PCP or specialist.   Linus Mako, PA-C 09/03/14 [id number]

## 2014-09-03 NOTE — ED Notes (Signed)
Ortho at bedside.

## 2014-09-03 NOTE — ED Notes (Signed)
Pt walking to car at [employer] parking lot and unsteady from new drug she was just started. Pt fell to the ground and hit head and hurt right elbow. Loss of Consciousness for unknown amount of time. Pt alert and oriented at this time. 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 BP 135/76 Hr 68 and sats 98%.

## 2014-09-21 ENCOUNTER — Encounter: Payer: Self-pay | Admitting: Physician Assistant

## 2014-09-21 ENCOUNTER — Ambulatory Visit (INDEPENDENT_AMBULATORY_CARE_PROVIDER_SITE_OTHER): Payer: PPO | Admitting: Physician Assistant

## 2014-09-21 VITALS — BP 122/70 | HR 68 | Temp 98.3°F | Resp 16 | Ht 62.5 in | Wt 158.0 lb

## 2014-09-21 DIAGNOSIS — E785 Hyperlipidemia, unspecified: Secondary | ICD-10-CM

## 2014-09-21 DIAGNOSIS — E669 Obesity, unspecified: Secondary | ICD-10-CM

## 2014-09-21 DIAGNOSIS — J449 Chronic obstructive pulmonary disease, unspecified: Secondary | ICD-10-CM

## 2014-09-21 DIAGNOSIS — E1122 Type 2 diabetes mellitus with diabetic chronic kidney disease: Secondary | ICD-10-CM

## 2014-09-21 DIAGNOSIS — R590 Localized enlarged lymph nodes: Secondary | ICD-10-CM

## 2014-09-21 DIAGNOSIS — K7460 Unspecified cirrhosis of liver: Secondary | ICD-10-CM

## 2014-09-21 DIAGNOSIS — Z79899 Other long term (current) drug therapy: Secondary | ICD-10-CM

## 2014-09-21 DIAGNOSIS — E559 Vitamin D deficiency, unspecified: Secondary | ICD-10-CM

## 2014-09-21 DIAGNOSIS — I10 Essential (primary) hypertension: Secondary | ICD-10-CM

## 2014-09-21 DIAGNOSIS — R1310 Dysphagia, unspecified: Secondary | ICD-10-CM

## 2014-09-21 LAB — HEMOGLOBIN A1C
Hgb A1c MFr Bld: 5.1 % (ref <5.7)
Mean Plasma Glucose: 100 mg/dL (ref <117)

## 2014-09-21 LAB — TSH: TSH: 1.518 u[IU]/mL (ref 0.350–4.500)

## 2014-09-21 LAB — BASIC METABOLIC PANEL WITH GFR
BUN: 13 mg/dL (ref 6–23)
CO2: 29 mEq/L (ref 19–32)
Calcium: 9.1 mg/dL (ref 8.4–10.5)
Chloride: 104 mEq/L (ref 96–112)
Creat: 0.86 mg/dL (ref 0.50–1.10)
GFR, EST AFRICAN AMERICAN: 81 mL/min
GFR, EST NON AFRICAN AMERICAN: 70 mL/min
Glucose, Bld: 71 mg/dL (ref 70–99)
Potassium: 4.1 mEq/L (ref 3.5–5.3)
SODIUM: 142 meq/L (ref 135–145)

## 2014-09-21 LAB — HEPATIC FUNCTION PANEL
ALK PHOS: 82 U/L (ref 39–117)
ALT: 15 U/L (ref 0–35)
AST: 21 U/L (ref 0–37)
Albumin: 3.5 g/dL (ref 3.5–5.2)
BILIRUBIN DIRECT: 0.1 mg/dL (ref 0.0–0.3)
BILIRUBIN TOTAL: 0.6 mg/dL (ref 0.2–1.2)
Indirect Bilirubin: 0.5 mg/dL (ref 0.2–1.2)
Total Protein: 5.5 g/dL — ABNORMAL LOW (ref 6.0–8.3)

## 2014-09-21 LAB — LIPID PANEL
CHOLESTEROL: 173 mg/dL (ref 0–200)
HDL: 34 mg/dL — ABNORMAL LOW (ref >39)
LDL Cholesterol: 81 mg/dL (ref 0–99)
Total CHOL/HDL Ratio: 5.1 Ratio
Triglycerides: 290 mg/dL — ABNORMAL HIGH (ref <150)
VLDL: 58 mg/dL — ABNORMAL HIGH (ref 0–40)

## 2014-09-21 LAB — MAGNESIUM: Magnesium: 1.8 mg/dL (ref 1.5–2.5)

## 2014-09-21 NOTE — Progress Notes (Signed)
Assessment and Plan:  Hypertension: Continue medication, monitor blood pressure at home. Continue DASH diet.  Reminder to go to the ER if any CP, SOB, nausea, dizziness, severe HA, changes vision/speech, left arm numbness and tingling and jaw pain. Cholesterol: Continue diet and exercise. Check cholesterol.  Diabetes with diabetic chronic kidney disease-Continue diet and exercise. Check A1C Vitamin D Def- check level and continue medications.  Obesity with co morbidities- long discussion about weight loss, diet, and exercise Cirrhosis- check LFTs, albumin, CBC, follow up Dr. Deatra Ina PRN.  Right neck lymphadenopathy, + tenderness, + smoking history- will get [place], refer Dr. Constance Holster, if WBC is elevated will send in ABX.  S/p fall- follow up with ortho, declines Xray of right elbow.  Smoking cessation-  instruction/counseling given, counseled patient on the dangers of tobacco use, advised patient to stop smoking, and reviewed strategies to maximize success, patient not ready to quit at this time.   Continue diet and meds as discussed. Further disposition pending results of labs. Discussed med's effects and SE's.   OVER 40 minutes of exam, counseling, chart review, referral performed   HPI 69 y.o. smoking female with very complicated history presents for 3 month follow up with hypertension, hyperlipidemia, diabetes and vitamin D.  Her blood pressure has been controlled at home, today their BP is BP: 122/70 mmHg.  She does not workout. She denies chest pain, shortness of breath, dizziness.  She is not on cholesterol medication and denies myalgias. Her cholesterol is not at goal. The cholesterol was:  06/17/2014: Cholesterol, Total 216*; HDL Cholesterol by NMR 34*; LDL (calc) NOT CALC; Triglycerides 526*  She has been working on diet and exercise for diabetes with diabetic chronic kidney disease, she is on bASA, she is not on ACE/ARB, and denies  paresthesia of the feet, polydipsia, polyuria and visual  disturbances. Last A1C was: 06/17/2014: Hemoglobin-A1c 5.3  Patient is on Vitamin D supplement. 06/17/2014: Vit D, 25-Hydroxy 79  She has cirrhosis and follows with Dr. Deatra Ina.  She fell 2 weeks ago, lost her balance and fell, hit head, no LOC at time of fall but states when she was sitting up she passed out, had negative CT head at hospital. Hit right elbow/shoulder, had negative Xrays but still have significant pain left elbow, some soreness of her neck. Husband states her gabapentin was increased right before the fall, he has since decreased it back to only 2 a day.  She has a longer history of pain and decreased ROM of her left shoulder, has had injection in the past, she follows with [facility] [facility].  Patient has COPD, she is O2 dependent on 3 L O2, with [place]. Current every day smoker with 50 pack year smoking history, her and her husband state she has enlarged mass on right neck, had [place] in 2013 and saw Dr. Lucia Gaskins at that time however it has gotten worse over the last 3 months. Tender, painful, she has drainage in her throat, + nasal congestion, has some trouble with swallowing, denies GERD while on meds. Denies weight loss, night sweats, fever or chills.  Wt Readings from Last 3 Encounters:  09/21/14 158 lb (71.668 kg)  06/17/14 153 lb 9.6 oz (69.673 kg)  05/05/14 148 lb (67.132 kg)     Current Medications:  Current Outpatient Prescriptions on File Prior to Visit  Medication Sig Dispense Refill  . acetaminophen-codeine (TYLENOL #3) 300-30 MG per tablet Take 1/2 to 1 tablet up to 3 to 4  X day as needed for  severe pain 120 tablet 1  . amitriptyline (ELAVIL) 10 MG tablet TAKE 1 TABLET BY MOUTH 3 TIMES A DAY AS NEEDED 270 tablet 1  . aspirin 81 MG tablet Take 81 mg by mouth daily.    . benzonatate (TESSALON) 100 MG capsule TAKE 1 CAPSULE (100 MG TOTAL) BY MOUTH 3 (THREE) TIMES DAILY AS NEEDED FOR COUGH. 100 capsule 1  . Cholecalciferol (VITAMIN D-3) 1000 UNITS CAPS Take 5,000 Units by mouth daily.     . citalopram (CELEXA) 40 MG tablet TAKE 1 TABLET BY MOUTH TWICE A  DAY FOR MOOD    . cyclobenzaprine (FLEXERIL) 10 MG tablet Take 10 mg by mouth 3 (three) times daily as needed for muscle spasms.    . ferrous sulfate 325 (65 FE) MG tablet TAKE 1 TABLET TWICE DAILY (Patient taking differently: TAKE 1 TABLET ONCE DAILY) 60 tablet 5  . furosemide (LASIX) 40 MG tablet Take 1 tablet (40 mg total) by mouth daily. For fluid 30 tablet 11  . gabapentin (NEURONTIN) 300 MG capsule TAKE ONE CAPSULE 3 TIMES A DAY (Patient taking differently: TAKE ONE CAPSULE 4 TIMES A DAY) 90 capsule 11  . ipratropium (ATROVENT) 0.02 % nebulizer solution Take 2.5 mLs (0.5 mg total) by nebulization every 4 (four) hours as needed for wheezing or shortness of breath. 75 mL 2  . Magnesium 250 MG TABS Take 250 mg by mouth daily at 6 (six) AM.    . metFORMIN (GLUCOPHAGE-XR) 500 MG 24 hr tablet TAKE 2 TABLETS BY MOUTH TWICE DAILY AFTER MEALS 360 tablet 1  . nadolol (CORGARD) 40 MG tablet Take 1 tablet (40 mg total) by mouth daily. 30 tablet 5  . Omega-3 Fatty Acids (FISH OIL) 1000 MG CAPS Take 1,000 mg by mouth 2 (two) times daily.    Marland Kitchen OVER THE COUNTER MEDICATION Take 1-2 tablets by mouth as needed (constipation). Natural Veg Laxative    . OXYGEN Inhale 3 L/min into the lungs continuous as needed. On exertion and at home    . pantoprazole (PROTONIX) 40 MG tablet Take 1 tablet (40 mg total) by mouth 2 (two) times daily. 180 tablet 0  . spironolactone (ALDACTONE) 25 MG tablet Take 1 tablet (25 mg total) by mouth 2 (two) times daily. For fluids and swelling (Patient taking differently: Take 1 tablet (25 mg total) by mouth once daily.) 90 tablet 0  . tamoxifen (NOLVADEX) 20 MG tablet Take 1 tablet (20 mg total) by mouth daily. 90 tablet 11   No current facility-administered medications on file prior to visit.   Medical History:  Past Medical History  Diagnosis Date  . Hx of shoulder replacement   . Diabetes mellitus     reports  "borderline"  . Type II or unspecified type diabetes mellitus without mention of complication, not stated as uncontrolled 08/31/2013  . Hypertension   . Hyperlipidemia   . Asthma   . COPD (chronic obstructive pulmonary disease)   . GERD (gastroesophageal reflux disease)   . Vitamin D deficiency   . IBS (irritable bowel syndrome)   . Fibromyalgia    Allergies:  Allergies  Allergen Reactions  . Atorvastatin   . Diphenhydramine Hcl   . Hydrocodone-Acetaminophen   . Loratadine-Pseudoephedrine Er   . Lorazepam   . Simvastatin   . Sulfonamide Derivatives     Review of Systems:  Review of Systems  Constitutional: Positive for malaise/fatigue. Negative for fever, chills, weight loss and diaphoresis.  HENT: Positive for congestion. Negative for ear discharge,  ear pain, hearing loss, nosebleeds, sore throat and tinnitus.   Respiratory: Negative.  Negative for stridor.   Cardiovascular: Negative.   Gastrointestinal: Positive for abdominal pain (RUQ, and states seems more distended than usual) and blood in stool (on iron). Negative for heartburn, nausea, vomiting, diarrhea, constipation and melena.  Genitourinary: Negative.   Musculoskeletal: Positive for myalgias.  Skin: Negative.  Negative for itching and rash.  Neurological: Positive for dizziness (better since decreased gapapentin). Negative for tingling, tremors, sensory change, speech change, focal weakness, seizures, loss of consciousness, weakness and headaches.  Psychiatric/Behavioral: Negative.     Family history- Review and unchanged Social history- Review and unchanged Physical Exam: BP 122/70 mmHg  Pulse 68  Temp(Src) 98.3 F (36.8 C)  Resp 16  Ht 5' 2.5" (1.588 m)  Wt 158 lb (71.668 kg)  BMI 28.42 kg/m2 Wt Readings from Last 3 Encounters:  09/21/14 158 lb (71.668 kg)  06/17/14 153 lb 9.6 oz (69.673 kg)  05/05/14 148 lb (67.132 kg)   General Appearance: Well nourished, in no apparent distress. Eyes: PERRLA, EOMs,  conjunctiva no swelling or erythema Sinuses: No Frontal/maxillary tenderness ENT/Mouth: Ext aud canals clear, TMs without erythema, bulging. No erythema, swelling, or exudate on post pharynx.  Tonsils not swollen or erythematous. Hearing normal.  Neck: Supple, thyroid normal.  Respiratory: diffuse decreased breath sounds, BS equal bilaterally without rales, rhonchi, wheezing or stridor.  Cardio: RRR with no MRGs. Brisk peripheral pulses without edema.  Abdomen: Soft, + BS, Distended, LUQ/epigastric tender, no guarding, rebound, hernias, masses. Lymphatics: + right side tender cervical lymphadenopathy.  Musculoskeletal: Full ROM, 4/5 strength, Antalgic gait, very tender right elbow to palpation with slight edema. Decreased ROM left shoulder due to pain.  Skin: Warm, dry without rashes, lesions, ecchymosis.  Neuro: Cranial nerves intact. No cerebellar symptoms.  Psych: Awake and oriented X 3, normal affect, Insight and Judgment appropriate.    Vicie Mutters, PA-C 11:24 AM [facility]

## 2014-09-21 NOTE — Patient Instructions (Signed)
We are giving you chantix for smoking cessation. You can do it! And we are here to help! You may have heard some scary side effects about chantix, the three most common I hear about are nausea, crazy dreams and depression.  However, I like for my patients to try to stay on 1/2 a tablet twice a day rather than one tablet twice a day as normally prescribed. This helps decrease the chances of side effects and helps save money by making a one month prescription last two months  Please start the prescription this way:  Start 1/2 tablet by mouth once daily after food with a full glass of water for 3 days Then do 1/2 tablet by mouth twice daily for 4 days.  At this point we have several options: 1) continue on 1/2 tablet twice a day- which I encourage you to do. You can stay on this dose the rest of the time on the medication or if you still feel the need to smoke you can do one of the two options below. 2) do one tablet in the morning and 1/2 in the evening which helps decrease dreams. 3) do one tablet twice a day.   What if I miss a dose? If you miss a dose, take it as soon as you can. If it is almost time for your next dose, take only that dose. Do not take double or extra doses.  What should I watch for while using this medicine? Visit your doctor or health care professional for regular check ups. Ask for ongoing advice and encouragement from your doctor or healthcare professional, friends, and family to help you quit. If you smoke while on this medication, quit again  Your mouth may get dry. Chewing sugarless gum or hard candy, and drinking plenty of water may help. Contact your doctor if the problem does not go away or is severe.  You may get drowsy or dizzy. Do not drive, use machinery, or do anything that needs mental alertness until you know how this medicine affects you. Do not stand or sit up quickly, especially if you are an older patient.   The use of this medicine may increase the chance  of suicidal thoughts or actions. Pay special attention to how you are responding while on this medicine. Any worsening of mood, or thoughts of suicide or dying should be reported to your health care professional right away.  ADVANTAGES OF QUITTING SMOKING  Within 20 minutes, blood pressure decreases. Your pulse is at normal level.  After 8 hours, carbon monoxide levels in the blood return to normal. Your oxygen level increases.  After 24 hours, the chance of having a heart attack starts to decrease. Your breath, hair, and body stop smelling like smoke.  After 48 hours, damaged nerve endings begin to recover. Your sense of taste and smell improve.  After 72 hours, the body is virtually free of nicotine. Your bronchial tubes relax and breathing becomes easier.  After 2 to 12 weeks, lungs can hold more air. Exercise becomes easier and circulation improves.  After 1 year, the risk of coronary heart disease is cut in half.  After 5 years, the risk of stroke falls to the same as a nonsmoker.  After 10 years, the risk of lung cancer is cut in half and the risk of other cancers decreases significantly.  After 15 years, the risk of coronary heart disease drops, usually to the level of a nonsmoker.  You will have extra money  to spend on things other than cigarettes.  .Fall Prevention and Home Safety Falls cause injuries and can affect all age groups. It is possible to use preventive measures to significantly decrease the likelihood of falls. There are many simple measures which can make your home safer and prevent falls. OUTDOORS  Repair cracks and edges of walkways and driveways.  Remove high doorway thresholds.  Trim shrubbery on the main path into your home.  Have good outside lighting.  Clear walkways of tools, rocks, debris, and clutter.  Check that handrails are not broken and are securely fastened. Both sides of steps should have handrails.  Have leaves, snow, and ice cleared  regularly.  Use sand or salt on walkways during winter months.  In the garage, clean up grease or oil spills. BATHROOM  Install night lights.  Install grab bars by the toilet and in the tub and shower.  Use non-skid mats or decals in the tub or shower.  Place a plastic non-slip stool in the shower to sit on, if needed.  Keep floors dry and clean up all water on the floor immediately.  Remove soap buildup in the tub or shower on a regular basis.  Secure bath mats with non-slip, double-sided rug tape.  Remove throw rugs and tripping hazards from the floors. BEDROOMS  Install night lights.  Make sure a bedside light is easy to reach.  Do not use oversized bedding.  Keep a telephone by your bedside.  Have a firm chair with side arms to use for getting dressed.  Remove throw rugs and tripping hazards from the floor. KITCHEN  Keep handles on pots and pans turned toward the center of the stove. Use back burners when possible.  Clean up spills quickly and allow time for drying.  Avoid walking on wet floors.  Avoid hot utensils and knives.  Position shelves so they are not too high or low.  Place commonly used objects within easy reach.  If necessary, use a sturdy step stool with a grab bar when reaching.  Keep electrical cables out of the way.  Do not use floor polish or wax that makes floors slippery. If you must use wax, use non-skid floor wax.  Remove throw rugs and tripping hazards from the floor. STAIRWAYS  Never leave objects on stairs.  Place handrails on both sides of stairways and use them. Fix any loose handrails. Make sure handrails on both sides of the stairways are as long as the stairs.  Check carpeting to make sure it is firmly attached along stairs. Make repairs to worn or loose carpet promptly.  Avoid placing throw rugs at the top or bottom of stairways, or properly secure the rug with carpet tape to prevent slippage. Get rid of throw rugs, if  possible.  Have an electrician put in a light switch at the top and bottom of the stairs. OTHER FALL PREVENTION TIPS  Wear low-heel or rubber-soled shoes that are supportive and fit well. Wear closed toe shoes.  When using a stepladder, make sure it is fully opened and both spreaders are firmly locked. Do not climb a closed stepladder.  Add color or contrast paint or tape to grab bars and handrails in your home. Place contrasting color strips on first and last steps.  Learn and use mobility aids as needed. Install an electrical emergency response system.  Turn on lights to avoid dark areas. Replace light bulbs that burn out immediately. Get light switches that glow.  Arrange furniture to  create clear pathways. Keep furniture in the same place.  Firmly attach carpet with non-skid or double-sided tape.  Eliminate uneven floor surfaces.  Select a carpet pattern that does not visually hide the edge of steps.  Be aware of all pets. OTHER HOME SAFETY TIPS  Set the water temperature for 120 F (48.8 C).  Keep emergency numbers on or near the telephone.  Keep smoke detectors on every level of the home and near sleeping areas. Document Released: 07/13/2002 Document Revised: 01/22/2012 Document Reviewed: 10/12/2011 [facility] Patient Information 2015 [place], [place]. This information is not intended to replace advice given to you by your health care provider. Make sure you discuss any questions you have with your health care provider.

## 2014-09-22 ENCOUNTER — Ambulatory Visit
Admission: RE | Admit: 2014-09-22 | Discharge: 2014-09-22 | Disposition: A | Discharge status: Home / Self Care | Payer: PPO | Source: Ambulatory Visit | Attending: Physician Assistant | Admitting: Physician Assistant

## 2014-09-22 ENCOUNTER — Other Ambulatory Visit: Payer: PPO

## 2014-09-22 ENCOUNTER — Other Ambulatory Visit: Payer: Self-pay | Admitting: Internal Medicine

## 2014-09-22 DIAGNOSIS — R590 Localized enlarged lymph nodes: Secondary | ICD-10-CM

## 2014-09-22 DIAGNOSIS — R1310 Dysphagia, unspecified: Secondary | ICD-10-CM

## 2014-09-22 LAB — CBC WITH DIFFERENTIAL/PLATELET
BASOS PCT: 1 % (ref 0–1)
Basophils Absolute: 0 10*3/uL (ref 0.0–0.1)
Eosinophils Absolute: 0.1 10*3/uL (ref 0.0–0.7)
Eosinophils Relative: 3 % (ref 0–5)
HEMATOCRIT: 39.6 % (ref 36.0–46.0)
HEMOGLOBIN: 13.5 g/dL (ref 12.0–15.0)
Lymphocytes Relative: 38 % (ref 12–46)
Lymphs Abs: 1.5 10*3/uL (ref 0.7–4.0)
MCH: 31.8 pg (ref 26.0–34.0)
MCHC: 34.1 g/dL (ref 30.0–36.0)
MCV: 93.2 fL (ref 78.0–100.0)
MPV: 11.1 fL (ref 8.6–12.4)
Monocytes Absolute: 0.2 10*3/uL (ref 0.1–1.0)
Monocytes Relative: 6 % (ref 3–12)
NEUTROS ABS: 2.1 10*3/uL (ref 1.7–7.7)
NEUTROS PCT: 52 % (ref 43–77)
Platelets: 83 10*3/uL — ABNORMAL LOW (ref 150–400)
RBC: 4.25 MIL/uL (ref 3.87–5.11)
RDW: 14.2 % (ref 11.5–15.5)
WBC: 4 10*3/uL (ref 4.0–10.5)

## 2014-09-22 LAB — VITAMIN D 25 HYDROXY (VIT D DEFICIENCY, FRACTURES): VIT D 25 HYDROXY: 54 ng/mL (ref 30–100)

## 2014-10-13 ENCOUNTER — Other Ambulatory Visit: Payer: Self-pay | Admitting: Internal Medicine

## 2014-10-14 ENCOUNTER — Ambulatory Visit (INDEPENDENT_AMBULATORY_CARE_PROVIDER_SITE_OTHER): Payer: PPO | Admitting: Internal Medicine

## 2014-10-14 ENCOUNTER — Encounter: Payer: Self-pay | Admitting: Internal Medicine

## 2014-10-14 VITALS — BP 110/64 | HR 52 | Temp 97.7°F | Resp 16 | Ht 62.5 in | Wt 153.0 lb

## 2014-10-14 DIAGNOSIS — I10 Essential (primary) hypertension: Secondary | ICD-10-CM

## 2014-10-14 DIAGNOSIS — J449 Chronic obstructive pulmonary disease, unspecified: Secondary | ICD-10-CM

## 2014-10-17 ENCOUNTER — Encounter: Payer: Self-pay | Admitting: Internal Medicine

## 2014-10-17 NOTE — Progress Notes (Signed)
Subjective:    Patient ID: [name], female    DOB: 21-Oct-1945, 69 y.o.   MRN: [id number]  HPI    Patient presents for recheck and evaluation of her need for Oxygen. Patient has long standing cigarette induced severe COPD and requires supplemental oxygen. Even despite supplemental oxygen , patient becomes dyspneic walking even short distances . Current she is on O2 by nasal cannula @ 3 LPM. She has daily dry non-productive cough and no hx/o hemoptysis. Last CXR in Apr 2015 did show COPD.   HTN is stable and controlled and other problems include cirrhosis from NASH, T2_NIDDM/CKD2, GERD/Varices, HLD,& Vit D Deficiency - all of which seem stable at present.   Medication Sig  . acetaminophen-codeine (TYLENOL #3) 300-30 MG per tablet TAKE ONE-HALF TO 1 TABLET UP TO 3 TO 4 TIMES DAILY AS NEEDED FOR SEVERE PAIN  . amitriptyline (ELAVIL) 10 MG tablet TAKE 1 TAB 3 TIMES A DAY AS NEEDED  . aspirin 81 MG tablet Take 81 mg by mouth daily.  . benzonatate (TESSALON) 100 MG capsule TAKE 1 CAP 3 TIMES DAILY AS NEEDED FOR COUGH.  Marland Kitchen VITAMIN D-3 1000 UNITS CAPS Take 5,000 Units by mouth daily.  . citalopram (CELEXA) 40 MG tablet TAKE 1 TAB TWICE A  DAY FOR MOOD  . cyclobenzaprine (FLEXERIL) 10 MG tablet Take 10 mg by mouth 3 (three) times daily as needed  . ferrous sulfate 325 (65 FE) MG tablet TAKE 1 TABLET ONCE DAILY)  . furosemide (LASIX) 40 MG tablet Take 1 tablet (40 mg total) by mouth daily. For fluid  . gabapentin (NEURONTIN) 300 MG capsule  ONE CAPSULE 4 TIMES A DAY)  . ipratropium (ATROVENT) 0.02 % neb soln Take 2.5 mLs (0.5 mg total) by nebulization every 4 (four) hours as needed  . Magnesium 250 MG TABS Take 250 mg by mouth daily at 6 (six) AM.  . metFORMIN -XR 500 MG TAKE 2 TABLETS BY MOUTH TWICE DAILY AFTER MEALS  . nadolol (CORGARD) 40 MG tablet Take 1 tablet (40 mg total) by mouth daily.  Marland Kitchen FISH OIL 1000 MG CAPS Take 1,000 mg by mouth 2 (two) times daily.  Orie Fisherman Veg Laxative Take 1-2  tablets by mouth as needed (constipation).   . OXYGEN Inhale 3 L/min into the lungs continuous as needed. On exertion and at home  . pantoprazole (PROTONIX) 40 MG tablet Take 1 tablet (40 mg total) by mouth 2 (two) times daily.  Marland Kitchen spironolactone (ALDACTONE) 25 MG tablet Take 1 tablet (25 mg total) by mouth 2 (two) times daily.  . tamoxifen (NOLVADEX) 20 MG tablet Take 1 tablet (20 mg total) by mouth daily.   Allergies  Allergen Reactions  . Atorvastatin   . Diphenhydramine Hcl   . Hydrocodone-Acetaminophen   . Loratadine-Pseudoephedrine Er   . Lorazepam   . Simvastatin   . Sulfonamide Derivatives    Past Medical History  Diagnosis Date  . Hx of shoulder replacement   . Diabetes mellitus     reports "borderline"  . Type II or unspecified type diabetes mellitus without mention of complication, not stated as uncontrolled 08/31/2013  . Hypertension   . Hyperlipidemia   . Asthma   . COPD (chronic obstructive pulmonary disease)   . GERD (gastroesophageal reflux disease)   . Vitamin D deficiency   . IBS (irritable bowel syndrome)   . Fibromyalgia    Past Surgical History  Procedure Laterality Date  . Hip arthroplasty Bilateral   .  Shoulder surgery Right   . Back surgery    . Cholecystectomy    . Neck surgery    . Esophagogastroduodenoscopy  07/16/2012    Procedure: ESOPHAGOGASTRODUODENOSCOPY (EGD);  Surgeon: Inda Castle, MD;  Location: [facility];  Service: Endoscopy;  Laterality: N/A;  . Gastric varices banding  07/16/2012    Procedure: GASTRIC VARICES BANDING;  Surgeon: Inda Castle, MD;  Location: [facility];  Service: Endoscopy;  Laterality: N/A;   Review of Systems  10 point systems review negative except as above.    Objective:   Physical Exam BP 110/64 mmHg  Pulse 52  Temp(Src) 97.7 F (36.5 C)  Resp 16  Ht 5' 2.5" (1.588 m)  Wt 153 lb (69.4 kg)  BMI 27.52 kg/m2  HEENT - Eac's patent. TM's Nl. EOM's full. PERRLA. NasoOroPharynx clear. Neck - supple.  Nl Thyroid. Carotids 2+ & No bruits, nodes, JVD Chest -Very distant BS w/o Rales, rhonchi, wheezes. Cor - Nl HS. RRR w/o sig MGR. PP 1(+). No edema. Abd - No palpable organomegaly, masses or tenderness. BS nl. MS- FROM w/o deformities. Muscle power, tone and bulk Nl. Gait Nl. Neuro - No obvious Cr N abnormalities. Sensory, motor and Cerebellar functions appear Nl w/o focal abnormalities. Psyche - Mental status normal & appropriate.  No delusions, ideations or obvious mood abnormalities.  Patient's O2 on RA = 86% sat Patient's O2 on 3 LPM O2 = 90% sat at rest  Patient's O2 on 3 LPM O2 = 86% with exertion walking 25-30 feet    Assessment & Plan:   1. Chronic obstructive pulmonary disease, unspecified COPD, unspecified chronic bronchitis type  - Continue  O2 @ 3 LPM continuous to allow patient to maintain mobility. Recommend Stationary Oxygen concentrator, Portable O2 concentrator or tank with conserving device  2. Essential hypertension   Ernst Bowler, MD  Electronically verified  ([id number])

## 2014-10-17 NOTE — Progress Notes (Deleted)
Patient ID: [name], female   DOB: [birth year], 69 y.o.   MRN: [id number]

## 2014-10-26 ENCOUNTER — Other Ambulatory Visit: Payer: Self-pay | Admitting: Internal Medicine

## 2014-10-28 ENCOUNTER — Other Ambulatory Visit: Payer: Self-pay | Admitting: Internal Medicine

## 2014-11-08 ENCOUNTER — Other Ambulatory Visit: Payer: Self-pay | Admitting: *Deleted

## 2014-11-08 MED ORDER — AMITRIPTYLINE HCL 10 MG PO TABS: ORAL_TABLET | ORAL | Status: DC

## 2014-11-15 ENCOUNTER — Other Ambulatory Visit: Payer: Self-pay | Admitting: Internal Medicine

## 2014-11-16 ENCOUNTER — Other Ambulatory Visit: Payer: Self-pay | Admitting: Internal Medicine

## 2014-11-17 ENCOUNTER — Other Ambulatory Visit: Payer: Self-pay | Admitting: Internal Medicine

## 2014-11-17 ENCOUNTER — Ambulatory Visit (HOSPITAL_COMMUNITY)
Admission: RE | Admit: 2014-11-17 | Discharge: 2014-11-17 | Disposition: A | Discharge status: Home / Self Care | Payer: PPO | Source: Ambulatory Visit | Attending: Internal Medicine | Admitting: Internal Medicine

## 2014-11-17 ENCOUNTER — Ambulatory Visit (INDEPENDENT_AMBULATORY_CARE_PROVIDER_SITE_OTHER): Payer: PPO | Admitting: Internal Medicine

## 2014-11-17 ENCOUNTER — Encounter: Payer: Self-pay | Admitting: Internal Medicine

## 2014-11-17 VITALS — BP 124/62 | HR 58 | Temp 98.6°F | Resp 16 | Ht 62.5 in | Wt 160.0 lb

## 2014-11-17 DIAGNOSIS — R0989 Other specified symptoms and signs involving the circulatory and respiratory systems: Secondary | ICD-10-CM | POA: Diagnosis not present

## 2014-11-17 DIAGNOSIS — R0602 Shortness of breath: Secondary | ICD-10-CM | POA: Diagnosis not present

## 2014-11-17 DIAGNOSIS — R0789 Other chest pain: Secondary | ICD-10-CM | POA: Insufficient documentation

## 2014-11-17 DIAGNOSIS — J449 Chronic obstructive pulmonary disease, unspecified: Secondary | ICD-10-CM

## 2014-11-17 DIAGNOSIS — R300 Dysuria: Secondary | ICD-10-CM

## 2014-11-17 DIAGNOSIS — J45909 Unspecified asthma, uncomplicated: Secondary | ICD-10-CM

## 2014-11-17 MED ORDER — CITALOPRAM HYDROBROMIDE 40 MG PO TABS: ORAL_TABLET | ORAL | Status: DC

## 2014-11-17 MED ORDER — PREDNISONE 20 MG PO TABS: ORAL_TABLET | ORAL | Status: DC

## 2014-11-17 MED ORDER — CYCLOBENZAPRINE HCL 10 MG PO TABS: 10.0000 mg | ORAL_TABLET | Freq: Three times a day (TID) | ORAL | Status: DC | PRN

## 2014-11-17 MED ORDER — LEVOFLOXACIN 750 MG PO TABS: 750.0000 mg | ORAL_TABLET | Freq: Every day | ORAL | Status: AC

## 2014-11-17 NOTE — Progress Notes (Signed)
Subjective:    Patient ID: [name], female    DOB: 1946/05/13, 69 y.o.   MRN: [id number]  HPI  Patient presents to the office with her husband for evaluation of nausea, chest congestion, and also possible UTI.  She quit smoking approximately 2 weeks ago and she has had nausea intermittently for 2 weeks.  She reports that she has some intermittent sharp pains in her chest for 2 weeks and would like to have a chest xray.  She reports that her urine is really dark, it burns slightly, and it has a bad odor. She thinks this is causing the nausea.    Review of Systems  Constitutional: Positive for fever and fatigue. Negative for chills.  HENT: Positive for congestion, postnasal drip, rhinorrhea and sore throat. Negative for trouble swallowing.   Respiratory: Positive for chest tightness and shortness of breath. Negative for cough and wheezing.   Cardiovascular: Positive for chest pain and leg swelling (chronic left leg swelling which goes away with elevation).  Gastrointestinal: Positive for nausea. Negative for vomiting, abdominal pain, diarrhea, constipation and anal bleeding.  Genitourinary: Positive for dysuria, urgency and frequency. Negative for hematuria and difficulty urinating.       Objective:   Physical Exam  Constitutional: She is oriented to person, place, and time. She appears well-developed and well-nourished. No distress. Nasal cannula in place.  On pulse oxygenation  HENT:  Head: Normocephalic and atraumatic.  Nose: Mucosal edema present.  Mouth/Throat: Uvula is midline, oropharynx is clear and moist and mucous membranes are normal. No trismus in the jaw. No oropharyngeal exudate.  Eyes: Conjunctivae and EOM are normal. Pupils are equal, round, and reactive to light. No scleral icterus.  Neck: Normal range of motion. Neck supple. No JVD present. No thyromegaly present.  Cardiovascular: Intact distal pulses.  An irregular rhythm present. Bradycardia present.  Exam  reveals no gallop and no friction rub.   Murmur heard. Pulmonary/Chest: Effort normal. No respiratory distress. She has decreased breath sounds in the left lower field. She has no wheezes. She has no rhonchi. She has rales in the right lower field. She exhibits no tenderness.  Abdominal: Soft. Bowel sounds are normal. She exhibits no distension and no mass. There is generalized tenderness. There is no rigidity, no rebound, no guarding, no tenderness at McBurney's point and negative Murphy's sign.  Musculoskeletal: Normal range of motion.  Lymphadenopathy:    She has no cervical adenopathy.  Neurological: She is alert and oriented to person, place, and time.  Skin: Skin is warm and dry. She is not diaphoretic.  Psychiatric: She has a normal mood and affect. Her behavior is normal. Judgment and thought content normal.  Nursing note and vitals reviewed.  Filed Vitals:   11/17/14 1134  BP: 124/62  Pulse: 58  Temp: 98.6 F (37 C)  Resp: 16           Assessment & Plan:    1. Chronic obstructive pulmonary disease, unspecified COPD, unspecified chronic bronchitis type -quesitonable exacerbation vs. Infection.   -cont meds -cont O2 - predniSONE (DELTASONE) 20 MG tablet; 3 tabs po day one, then 2 tabs daily x 4 days  Dispense: 11 tablet; Refill: 0 - levofloxacin (LEVAQUIN) 750 MG tablet; Take 1 tablet (750 mg total) by mouth daily.  Dispense: 7 tablet; Refill: 0 - DG Chest 2 View; Future  2. Asthma, unspecified asthma severity, uncomplicated -see #1  3. Dysuria -levaquin for urine coverage - Urinalysis, Routine w reflex microscopic -  Culture, Urine

## 2014-11-17 NOTE — Patient Instructions (Signed)

## 2014-11-18 LAB — URINALYSIS, MICROSCOPIC ONLY
CASTS: NONE SEEN
Crystals: NONE SEEN
Squamous Epithelial / LPF: NONE SEEN

## 2014-11-18 LAB — URINALYSIS, ROUTINE W REFLEX MICROSCOPIC
BILIRUBIN URINE: NEGATIVE
GLUCOSE, UA: NEGATIVE mg/dL
KETONES UR: NEGATIVE mg/dL
NITRITE: POSITIVE — AB
PH: 6.5 (ref 5.0–8.0)
Protein, ur: NEGATIVE mg/dL
Urobilinogen, UA: 0.2 mg/dL (ref 0.0–1.0)

## 2014-11-20 LAB — URINE CULTURE: Colony Count: >=100000

## 2014-12-06 ENCOUNTER — Encounter: Payer: Self-pay | Admitting: Physician Assistant

## 2014-12-11 ENCOUNTER — Other Ambulatory Visit: Payer: Self-pay | Admitting: Internal Medicine

## 2014-12-17 ENCOUNTER — Ambulatory Visit (INDEPENDENT_AMBULATORY_CARE_PROVIDER_SITE_OTHER): Payer: PPO | Admitting: Internal Medicine

## 2014-12-17 ENCOUNTER — Encounter: Payer: Self-pay | Admitting: Internal Medicine

## 2014-12-17 ENCOUNTER — Ambulatory Visit (HOSPITAL_COMMUNITY)
Admission: RE | Admit: 2014-12-17 | Discharge: 2014-12-17 | Disposition: A | Discharge status: Home / Self Care | Payer: PPO | Source: Ambulatory Visit | Attending: Internal Medicine | Admitting: Internal Medicine

## 2014-12-17 VITALS — BP 122/66 | HR 58 | Temp 98.0°F | Resp 16 | Ht 62.5 in

## 2014-12-17 DIAGNOSIS — Z86718 Personal history of other venous thrombosis and embolism: Secondary | ICD-10-CM | POA: Diagnosis not present

## 2014-12-17 DIAGNOSIS — R609 Edema, unspecified: Secondary | ICD-10-CM | POA: Diagnosis not present

## 2014-12-17 DIAGNOSIS — K7460 Unspecified cirrhosis of liver: Secondary | ICD-10-CM

## 2014-12-17 DIAGNOSIS — R0600 Dyspnea, unspecified: Secondary | ICD-10-CM

## 2014-12-17 LAB — D-DIMER, QUANTITATIVE (NOT AT ARMC): D DIMER QUANT: 0.39 ug{FEU}/mL (ref 0.00–0.48)

## 2014-12-17 LAB — PROTIME-INR
INR: 1.07 (ref <1.50)
PROTHROMBIN TIME: 13.9 s (ref 11.6–15.2)

## 2014-12-17 NOTE — Progress Notes (Signed)
Subjective:    Patient ID: [name], female    DOB: 07-15-1946, 69 y.o.   MRN: [id number]  HPI  Patient with PMH of COPD, esophageal varicies, hepatic cirrhosis who presents with her husband for concerns of increased weight and also swelling of the right leg.  She has noticed a significant increase in swelling in the right leg which has been going on for approximately 3 weeks.  It has gotten worse in the last two to three days.  She has continued to take her sprinolactone and also her lasix.  She reports a missed dose of lasix yesterday.  No elevation of legs lately.  She reports that her breathing has gotten a lot better since she quit smoking.  No coughing.  She has some mild sharp pains last week.    Review of Systems  Constitutional: Negative for fever, chills and fatigue.  Respiratory: Negative for cough, chest tightness and shortness of breath.   Cardiovascular: Positive for chest pain and leg swelling. Negative for palpitations.  Genitourinary: Negative for dysuria, urgency, frequency, hematuria and difficulty urinating.  Skin: Positive for color change.  Neurological: Negative for dizziness and light-headedness.       Objective:   Physical Exam  Constitutional: She is oriented to person, place, and time. She appears well-developed and well-nourished. No distress.  HENT:  Head: Normocephalic.  Mouth/Throat: Oropharynx is clear and moist. No oropharyngeal exudate.  Eyes: Conjunctivae are normal. No scleral icterus.  Neck: Normal range of motion. Neck supple. No JVD present. No thyromegaly present.  Cardiovascular: Normal rate, normal heart sounds and intact distal pulses.  Exam reveals no gallop and no friction rub.   No murmur heard. Pulses:      Dorsalis pedis pulses are 1+ on the right side, and 1+ on the left side.       Posterior tibial pulses are 1+ on the right side, and 1+ on the left side.  2-3+ pitting edema of the right foot with mild minimal redness to the  anterior pretibial area.  There is some tenderness to palpation.  No warmth palpable.  No calf tenderness bilaterally.  Chronic venous stasis changes to the left leg without obvious swelling.    Pulmonary/Chest: Effort normal and breath sounds normal. No respiratory distress. She has no wheezes. She has no rales. She exhibits no tenderness.  Breath sounds distant  Abdominal: Soft. Bowel sounds are normal. She exhibits distension (minimal distention). She exhibits no mass. There is no tenderness. There is no rebound and no guarding.  Musculoskeletal: She exhibits edema.  Lymphadenopathy:    She has no cervical adenopathy.  Neurological: She is alert and oriented to person, place, and time.  Skin: Skin is warm and dry. She is not diaphoretic.  Psychiatric: She has a normal mood and affect. Her behavior is normal. Judgment and thought content normal.  Nursing note and vitals reviewed.   Filed Vitals:   12/17/14 1114  BP: 122/66  Pulse: 58  Temp: 98 F (36.7 C)  Resp: 16    Wt Readings from Last 3 Encounters:  11/17/14 160 lb (72.576 kg)  10/14/14 153 lb (69.4 kg)  09/21/14 158 lb (71.668 kg)          Assessment & Plan:    1. Peripheral edema -patient getting DVT study today.  Differential includes peripheral edema secondary to CHF or cirrhosis vs. Possible early cellulitis.   -eliquis sample given pending results of DVT study. -increased furosemide to 80 mg in  the am and 40 mg in the pm. -cont spironolactone - Fibrin Derivatives D-dimer - LE VENOUS; Future -recheck next week on Tuesday  2. Cirrhosis of liver without ascites, unspecified hepatic cirrhosis type -check liver function -no ascites on exam - CMP with Estimated GFR ([id number]) - Protime-INR  3. Dyspnea -think swelling is likely due to cirrhosis but could possible cause mild CHF due to the fluid overload.  Increase fluid pill and continue to weigh daily and keep weight log. - Brain natriuretic peptide  (83291)

## 2014-12-17 NOTE — Progress Notes (Signed)
VASCULAR LAB PRELIMINARY  PRELIMINARY  PRELIMINARY  PRELIMINARY  Bilateral lower extremity venous duplex  completed.    Preliminary report:  Bilateral:  No evidence of DVT, superficial thrombosis, or Baker's Cyst.    Darci Lykins, RVT 12/17/2014, 1:40 PM

## 2014-12-17 NOTE — Patient Instructions (Addendum)
Please increase furosemide or lasix to 80 mg two tablets in the morning and 40 mg before 5 pm.  Please weigh yourself daily and keep a log.  We will call with an appointment for the ultrasound.    Edema Edema is an abnormal buildup of fluids in your bodytissues. Edema is somewhatdependent on gravity to pull the fluid to the lowest place in your body. That makes the condition more common in the legs and thighs (lower extremities). Painless swelling of the feet and ankles is common and becomes more likely as you get older. It is also common in looser tissues, like around your eyes.  When the affected area is squeezed, the fluid may move out of that spot and leave a dent for a few moments. This dent is called pitting.  CAUSES  There are many possible causes of edema. Eating too much salt and being on your feet or sitting for a long time can cause edema in your legs and ankles. Hot weather may make edema worse. Common medical causes of edema include:  Heart failure.  Liver disease.  Kidney disease.  Weak blood vessels in your legs.  Cancer.  An injury.  Pregnancy.  Some medications.  Obesity. SYMPTOMS  Edema is usually painless.Your skin may look swollen or shiny.  DIAGNOSIS  Your health care provider may be able to diagnose edema by asking about your medical history and doing a physical exam. You may need to have tests such as X-rays, an electrocardiogram, or blood tests to check for medical conditions that may cause edema.  TREATMENT  Edema treatment depends on the cause. If you have heart, liver, or kidney disease, you need the treatment appropriate for these conditions. General treatment may include:  Elevation of the affected body part above the level of your heart.  Compression of the affected body part. Pressure from elastic bandages or support stockings squeezes the tissues and forces fluid back into the blood vessels. This keeps fluid from entering the  tissues.  Restriction of fluid and salt intake.  Use of a water pill (diuretic). These medications are appropriate only for some types of edema. They pull fluid out of your body and make you urinate more often. This gets rid of fluid and reduces swelling, but diuretics can have side effects. Only use diuretics as directed by your health care provider. HOME CARE INSTRUCTIONS   Keep the affected body part above the level of your heart when you are lying down.   Do not sit still or stand for prolonged periods.   Do not put anything directly under your knees when lying down.  Do not wear constricting clothing or garters on your upper legs.   Exercise your legs to work the fluid back into your blood vessels. This may help the swelling go down.   Wear elastic bandages or support stockings to reduce ankle swelling as directed by your health care provider.   Eat a low-salt diet to reduce fluid if your health care provider recommends it.   Only take medicines as directed by your health care provider. SEEK MEDICAL CARE IF:   Your edema is not responding to treatment.  You have heart, liver, or kidney disease and notice symptoms of edema.  You have edema in your legs that does not improve after elevating them.   You have sudden and unexplained weight gain. SEEK IMMEDIATE MEDICAL CARE IF:   You develop shortness of breath or chest pain.   You cannot breathe when  you lie down.  You develop pain, redness, or warmth in the swollen areas.   You have heart, liver, or kidney disease and suddenly get edema.  You have a fever and your symptoms suddenly get worse. MAKE SURE YOU:   Understand these instructions.  Will watch your condition.  Will get help right away if you are not doing well or get worse. Document Released: 07/23/2005 Document Revised: 12/07/2013 Document Reviewed: 05/15/2013 [facility] Patient Information 2015 [place], [place]. This information is not intended to  replace advice given to you by your health care provider. Make sure you discuss any questions you have with your health care provider.

## 2014-12-18 LAB — COMPLETE METABOLIC PANEL WITH GFR
ALK PHOS: 74 U/L (ref 39–117)
ALT: 16 U/L (ref 0–35)
AST: 23 U/L (ref 0–37)
Albumin: 3.8 g/dL (ref 3.5–5.2)
BUN: 15 mg/dL (ref 6–23)
CHLORIDE: 105 meq/L (ref 96–112)
CO2: 31 meq/L (ref 19–32)
Calcium: 9.5 mg/dL (ref 8.4–10.5)
Creat: 0.85 mg/dL (ref 0.50–1.10)
GFR, Est African American: 81 mL/min
GFR, Est Non African American: 71 mL/min
GLUCOSE: 80 mg/dL (ref 70–99)
POTASSIUM: 4.4 meq/L (ref 3.5–5.3)
SODIUM: 144 meq/L (ref 135–145)
Total Bilirubin: 0.7 mg/dL (ref 0.2–1.2)
Total Protein: 5.8 g/dL — ABNORMAL LOW (ref 6.0–8.3)

## 2014-12-18 LAB — BRAIN NATRIURETIC PEPTIDE: Brain Natriuretic Peptide: 43.2 pg/mL (ref 0.0–100.0)

## 2014-12-20 ENCOUNTER — Other Ambulatory Visit: Payer: Self-pay | Admitting: Internal Medicine

## 2014-12-21 ENCOUNTER — Ambulatory Visit (INDEPENDENT_AMBULATORY_CARE_PROVIDER_SITE_OTHER): Payer: PPO | Admitting: Internal Medicine

## 2014-12-21 ENCOUNTER — Other Ambulatory Visit: Payer: Self-pay | Admitting: Internal Medicine

## 2014-12-21 VITALS — BP 114/60 | HR 56 | Temp 97.8°F | Resp 16 | Ht 62.5 in

## 2014-12-21 DIAGNOSIS — R609 Edema, unspecified: Secondary | ICD-10-CM

## 2014-12-21 MED ORDER — FUROSEMIDE 80 MG PO TABS: 80.0000 mg | ORAL_TABLET | Freq: Every day | ORAL | Status: DC

## 2014-12-21 NOTE — Progress Notes (Signed)
   Subjective:    Patient ID: [name], female    DOB: 01-Sep-1945, 69 y.o.   MRN: [id number]  HPI  Patient returns to the office for evaluation of right leg swelling.  Patient was seen in the office on Friday and was sent for DVT study which was negative.  She had increased dose of lasix at 80 mg in the morning and 40 mg in the afternoon.  Patient reports that she did have some decrease in swelling of the legs and has had no respiratory symptoms.  She has not been wearing compression stockings and has not been elevating her feet as she said it is too difficult.  Review of Systems  Constitutional: Negative for fever, chills and fatigue.  Respiratory: Negative for cough, chest tightness and shortness of breath.   Cardiovascular: Positive for leg swelling. Negative for palpitations.  Genitourinary: Negative for dysuria, urgency, frequency and difficulty urinating.  Musculoskeletal: Negative for myalgias.       Objective:   Physical Exam  Constitutional: She is oriented to person, place, and time. She appears well-developed and well-nourished. No distress.  HENT:  Head: Normocephalic and atraumatic.  Mouth/Throat: Oropharynx is clear and moist. No oropharyngeal exudate.  Eyes: Conjunctivae are normal. No scleral icterus.  Neck: Normal range of motion. Neck supple. No JVD present. No thyromegaly present.  Cardiovascular: Normal rate, regular rhythm and intact distal pulses.  Exam reveals no gallop and no friction rub.   Murmur heard. Pulses:      Dorsalis pedis pulses are 1+ on the right side, and 1+ on the left side.       Posterior tibial pulses are 1+ on the right side, and 1+ on the left side.  1+ edema bilaterally in pretibial area  Pulmonary/Chest: Effort normal. No respiratory distress. She has no wheezes. She has no rales. She exhibits no tenderness.  Distant breath sounds.  Patient is on [place] O2  Abdominal: Soft. Bowel sounds are normal. She exhibits no distension and no  mass. There is no tenderness. There is no rebound and no guarding.  Musculoskeletal: Normal range of motion.  Lymphadenopathy:    She has no cervical adenopathy.  Neurological: She is alert and oriented to person, place, and time. No cranial nerve deficit. Coordination normal.  Skin: Skin is warm and dry. She is not diaphoretic.  Psychiatric: She has a normal mood and affect. Her behavior is normal. Judgment and thought content normal.  Nursing note and vitals reviewed.         Assessment & Plan:    1. Peripheral edema -much improved on exam -cont current dose of lasix -cont weighing daily -elevate feet -avoid sodium -Compression stockings

## 2014-12-21 NOTE — Patient Instructions (Signed)
Compression Stockings Compression stockings are elastic stockings that "compress" your legs. This helps to increase blood flow, decrease swelling, and reduces the chance of getting blood clots in your lower legs. Compression stockings are used:  After surgery.  If you have a history of poor circulation.  If you are prone to blood clots.  If you have varicose veins.  If you sit or are bedridden for long periods of time. WEARING COMPRESSION STOCKINGS  Your compression stockings should be worn as instructed by your caregiver.  Wearing the correct stocking size is important. Your caregiver can help measure and fit you to the correct size.  When wearing your stockings, do not allow the stockings to bunch up. This is especially important around your toes or behind your knees. Keep the stockings as smooth as possible.  Do not roll the stockings downward and leave them rolled down. This can form a restrictive band around your legs and can decrease blood flow.  The stockings should be removed once a day for 1 hour or as instructed by your caregiver. When the stockings are taken off, inspect your legs and feet. Look for:  Open sores.  Red spots.  Puffy areas (swelling).  Anything that does not seem normal. IMPORTANT INFORMATION ABOUT COMPRESSION STOCKINGS  The compression stockings should be clean, dry, and in good condition before you put them on.  Do not put lotion on your legs or feet. This makes it harder to put the stockings on.  Change your stockings immediately if they become wet or soiled.  Do not wear stockings that are ripped or torn.  You may hand-wash or put your stockings in the washing machine. Use cold or warm water with mild detergent. Do not bleach your stockings. They may be air-dried or dried in the dryer on low heat.  If you have pain or have a feeling of "pins and needles" in your feet or legs, you may be wearing stockings that are too tight. Call your caregiver  right away. SEEK IMMEDIATE MEDICAL CARE IF:   You have numbness or tingling in your lower legs that does not get better quickly after the stockings are removed.  Your toes or feet become cold and blue.  You develop open sores or have red spots on your legs that do not go away. MAKE SURE YOU:   Understand these instructions.  Will watch your condition.  Will get help right away if you are not doing well or get worse. Document Released: 05/20/2009 Document Revised: 10/15/2011 Document Reviewed: 05/20/2009 [facility] Patient Information 2015 [place], [place]. This information is not intended to replace advice given to you by your health care provider. Make sure you discuss any questions you have with your health care provider.

## 2014-12-31 ENCOUNTER — Ambulatory Visit (INDEPENDENT_AMBULATORY_CARE_PROVIDER_SITE_OTHER): Payer: PPO | Admitting: Gastroenterology

## 2014-12-31 DIAGNOSIS — Z23 Encounter for immunization: Secondary | ICD-10-CM | POA: Diagnosis not present

## 2014-12-31 DIAGNOSIS — K7460 Unspecified cirrhosis of liver: Secondary | ICD-10-CM | POA: Diagnosis not present

## 2015-01-13 ENCOUNTER — Encounter: Payer: Self-pay | Admitting: Physician Assistant

## 2015-01-13 ENCOUNTER — Ambulatory Visit (INDEPENDENT_AMBULATORY_CARE_PROVIDER_SITE_OTHER): Payer: PPO | Admitting: Physician Assistant

## 2015-01-13 VITALS — BP 110/62 | HR 52 | Temp 97.7°F | Resp 16 | Ht 62.5 in | Wt 161.0 lb

## 2015-01-13 DIAGNOSIS — K5730 Diverticulosis of large intestine without perforation or abscess without bleeding: Secondary | ICD-10-CM

## 2015-01-13 DIAGNOSIS — Z9181 History of falling: Secondary | ICD-10-CM

## 2015-01-13 DIAGNOSIS — K209 Esophagitis, unspecified without bleeding: Secondary | ICD-10-CM

## 2015-01-13 DIAGNOSIS — Z0001 Encounter for general adult medical examination with abnormal findings: Secondary | ICD-10-CM

## 2015-01-13 DIAGNOSIS — R609 Edema, unspecified: Secondary | ICD-10-CM

## 2015-01-13 DIAGNOSIS — D649 Anemia, unspecified: Secondary | ICD-10-CM

## 2015-01-13 DIAGNOSIS — K2100 Gastro-esophageal reflux disease with esophagitis, without bleeding: Secondary | ICD-10-CM

## 2015-01-13 DIAGNOSIS — Z79899 Other long term (current) drug therapy: Secondary | ICD-10-CM

## 2015-01-13 DIAGNOSIS — E559 Vitamin D deficiency, unspecified: Secondary | ICD-10-CM

## 2015-01-13 DIAGNOSIS — E785 Hyperlipidemia, unspecified: Secondary | ICD-10-CM

## 2015-01-13 DIAGNOSIS — K589 Irritable bowel syndrome without diarrhea: Secondary | ICD-10-CM

## 2015-01-13 DIAGNOSIS — E669 Obesity, unspecified: Secondary | ICD-10-CM

## 2015-01-13 DIAGNOSIS — R1310 Dysphagia, unspecified: Secondary | ICD-10-CM

## 2015-01-13 DIAGNOSIS — G894 Chronic pain syndrome: Secondary | ICD-10-CM

## 2015-01-13 DIAGNOSIS — Z0000 Encounter for general adult medical examination without abnormal findings: Secondary | ICD-10-CM

## 2015-01-13 DIAGNOSIS — D126 Benign neoplasm of colon, unspecified: Secondary | ICD-10-CM

## 2015-01-13 DIAGNOSIS — I8500 Esophageal varices without bleeding: Secondary | ICD-10-CM

## 2015-01-13 DIAGNOSIS — J449 Chronic obstructive pulmonary disease, unspecified: Secondary | ICD-10-CM

## 2015-01-13 DIAGNOSIS — K7460 Unspecified cirrhosis of liver: Secondary | ICD-10-CM

## 2015-01-13 DIAGNOSIS — M797 Fibromyalgia: Secondary | ICD-10-CM

## 2015-01-13 DIAGNOSIS — M2540 Effusion, unspecified joint: Secondary | ICD-10-CM

## 2015-01-13 DIAGNOSIS — R6889 Other general symptoms and signs: Secondary | ICD-10-CM

## 2015-01-13 DIAGNOSIS — E1122 Type 2 diabetes mellitus with diabetic chronic kidney disease: Secondary | ICD-10-CM

## 2015-01-13 DIAGNOSIS — I10 Essential (primary) hypertension: Secondary | ICD-10-CM

## 2015-01-13 MED ORDER — FUROSEMIDE 80 MG PO TABS: 80.0000 mg | ORAL_TABLET | Freq: Two times a day (BID) | ORAL | Status: DC

## 2015-01-13 MED ORDER — PREGABALIN 100 MG PO CAPS: 100.0000 mg | ORAL_CAPSULE | Freq: Three times a day (TID) | ORAL | Status: DC

## 2015-01-13 NOTE — Patient Instructions (Addendum)
Increase amitriptyline to two at night and 1 in the AM OR Check on the lyrica price, you can STOP the gabapentin and start on the lyrica 2-3 pills a day Take your tylenol #3 at least 3 times a day  Try the colchicine samples 1-2 x a day for pain  Preventive Care for Adults A healthy lifestyle and preventive care can promote health and wellness. Preventive health guidelines for women include the following key practices.  A routine yearly physical is a good way to check with your health care provider about your health and preventive screening. It is a chance to share any concerns and updates on your health and to receive a thorough exam.  Visit your dentist for a routine exam and preventive care every 6 months. Brush your teeth twice a day and floss once a day. Good oral hygiene prevents tooth decay and gum disease.  The frequency of eye exams is based on your age, health, family medical history, use of contact lenses, and other factors. Follow your health care provider's recommendations for frequency of eye exams.  Eat a healthy diet. Foods like vegetables, fruits, whole grains, low-fat dairy products, and lean protein foods contain the nutrients you need without too many calories. Decrease your intake of foods high in solid fats, added sugars, and salt. Eat the right amount of calories for you.Get information about a proper diet from your health care provider, if necessary.  Regular physical exercise is one of the most important things you can do for your health. Most adults should get at least 150 minutes of moderate-intensity exercise (any activity that increases your heart rate and causes you to sweat) each week. In addition, most adults need muscle-strengthening exercises on 2 or more days a week.  Maintain a healthy weight. The body mass index (BMI) is a screening tool to identify possible weight problems. It provides an estimate of body fat based on height and weight. Your health care  provider can find your BMI and can help you achieve or maintain a healthy weight.For adults 20 years and older:  A BMI below 18.5 is considered underweight.  A BMI of 18.5 to 24.9 is normal.  A BMI of 25 to 29.9 is considered overweight.  A BMI of 30 and above is considered obese.  Maintain normal blood lipids and cholesterol levels by exercising and minimizing your intake of saturated fat. Eat a balanced diet with plenty of fruit and vegetables. If your lipid or cholesterol levels are high, you are over 50, or you are at high risk for heart disease, you may need your cholesterol levels checked more frequently.Ongoing high lipid and cholesterol levels should be treated with medicines if diet and exercise are not working.  If you smoke, find out from your health care provider how to quit. If you do not use tobacco, do not start.  Lung cancer screening is recommended for adults aged 18-80 years who are at high risk for developing lung cancer because of a history of smoking. A yearly low-dose CT scan of the lungs is recommended for people who have at least a 30-pack-year history of smoking and are a current smoker or have quit within the past 15 years. A pack year of smoking is smoking an average of 1 pack of cigarettes a day for 1 year (for example: 1 pack a day for 30 years or 2 packs a day for 15 years). Yearly screening should continue until the smoker has stopped smoking for at least  15 years. Yearly screening should be stopped for people who develop a health problem that would prevent them from having lung cancer treatment.  Avoid use of street drugs. Do not share needles with anyone. Ask for help if you need support or instructions about stopping the use of drugs.  High blood pressure causes heart disease and increases the risk of stroke.  Ongoing high blood pressure should be treated with medicines if weight loss and exercise do not work.  If you are 33-9 years old, ask your health care  provider if you should take aspirin to prevent strokes.  Diabetes screening involves taking a blood sample to check your fasting blood sugar level. This should be done once every 3 years, after age 18, if you are within normal weight and without risk factors for diabetes. Testing should be considered at a younger age or be carried out more frequently if you are overweight and have at least 1 risk factor for diabetes.  Breast cancer screening is essential preventive care for women. You should practice "breast self-awareness." This means understanding the normal appearance and feel of your breasts and may include breast self-examination. Any changes detected, no matter how small, should be reported to a health care provider. Women in their 86s and 30s should have a clinical breast exam (CBE) by a health care provider as part of a regular health exam every 1 to 3 years. After age 60, women should have a CBE every year. Starting at age 34, women should consider having a mammogram (breast X-ray test) every year. Women who have a family history of breast cancer should talk to their health care provider about genetic screening. Women at a high risk of breast cancer should talk to their health care providers about having an MRI and a mammogram every year.  Breast cancer gene (BRCA)-related cancer risk assessment is recommended for women who have family members with BRCA-related cancers. BRCA-related cancers include breast, ovarian, tubal, and peritoneal cancers. Having family members with these cancers may be associated with an increased risk for harmful changes (mutations) in the breast cancer genes BRCA1 and BRCA2. Results of the assessment will determine the need for genetic counseling and BRCA1 and BRCA2 testing.  Routine pelvic exams to screen for cancer are no longer recommended for nonpregnant women who are considered low risk for cancer of the pelvic organs (ovaries, uterus, and vagina) and who do not have  symptoms. Ask your health care provider if a screening pelvic exam is right for you.  If you have had past treatment for cervical cancer or a condition that could lead to cancer, you need Pap tests and screening for cancer for at least 20 years after your treatment. If Pap tests have been discontinued, your risk factors (such as having a new sexual partner) need to be reassessed to determine if screening should be resumed. Some women have medical problems that increase the chance of getting cervical cancer. In these cases, your health care provider may recommend more frequent screening and Pap tests.    Colorectal cancer can be detected and often prevented. Most routine colorectal cancer screening begins at the age of 30 years and continues through age 59 years. However, your health care provider may recommend screening at an earlier age if you have risk factors for colon cancer. On a yearly basis, your health care provider may provide home test kits to check for hidden blood in the stool. Use of a small camera at the end of a  tube, to directly examine the colon (sigmoidoscopy or colonoscopy), can detect the earliest forms of colorectal cancer. Talk to your health care provider about this at age 39, when routine screening begins. Direct exam of the colon should be repeated every 5-10 years through age 56 years, unless early forms of pre-cancerous polyps or small growths are found.  Osteoporosis is a disease in which the bones lose minerals and strength with aging. This can result in serious bone fractures or breaks. The risk of osteoporosis can be identified using a bone density scan. Women ages 61 years and over and women at risk for fractures or osteoporosis should discuss screening with their health care providers. Ask your health care provider whether you should take a calcium supplement or vitamin D to reduce the rate of osteoporosis.  Menopause can be associated with physical symptoms and risks.  Hormone replacement therapy is available to decrease symptoms and risks. You should talk to your health care provider about whether hormone replacement therapy is right for you.  Use sunscreen. Apply sunscreen liberally and repeatedly throughout the day. You should seek shade when your shadow is shorter than you. Protect yourself by wearing long sleeves, pants, a wide-brimmed hat, and sunglasses year round, whenever you are outdoors.  Once a month, do a whole body skin exam, using a mirror to look at the skin on your back. Tell your health care provider of new moles, moles that have irregular borders, moles that are larger than a pencil eraser, or moles that have changed in shape or color.  Stay current with required vaccines (immunizations).  Influenza vaccine. All adults should be immunized every year.  Tetanus, diphtheria, and acellular pertussis (Td, Tdap) vaccine. Pregnant women should receive 1 dose of Tdap vaccine during each pregnancy. The dose should be obtained regardless of the length of time since the last dose. Immunization is preferred during the 27th-36th week of gestation. An adult who has not previously received Tdap or who does not know her vaccine status should receive 1 dose of Tdap. This initial dose should be followed by tetanus and diphtheria toxoids (Td) booster doses every 10 years. Adults with an unknown or incomplete history of completing a 3-dose immunization series with Td-containing vaccines should begin or complete a primary immunization series including a Tdap dose. Adults should receive a Td booster every 10 years.    Zoster vaccine. One dose is recommended for adults aged 80 years or older unless certain conditions are present.    Pneumococcal 13-valent conjugate (PCV13) vaccine. When indicated, a person who is uncertain of her immunization history and has no record of immunization should receive the PCV13 vaccine. An adult aged 51 years or older who has certain  medical conditions and has not been previously immunized should receive 1 dose of PCV13 vaccine. This PCV13 should be followed with a dose of pneumococcal polysaccharide (PPSV23) vaccine. The PPSV23 vaccine dose should be obtained at least 8 weeks after the dose of PCV13 vaccine. An adult aged 80 years or older who has certain medical conditions and previously received 1 or more doses of PPSV23 vaccine should receive 1 dose of PCV13. The PCV13 vaccine dose should be obtained 1 or more years after the last PPSV23 vaccine dose.    Pneumococcal polysaccharide (PPSV23) vaccine. When PCV13 is also indicated, PCV13 should be obtained first. All adults aged 33 years and older should be immunized. An adult younger than age 45 years who has certain medical conditions should be immunized. Any person  who resides in a nursing home or long-term care facility should be immunized. An adult smoker should be immunized. People with an immunocompromised condition and certain other conditions should receive both PCV13 and PPSV23 vaccines. People with human immunodeficiency virus (HIV) infection should be immunized as soon as possible after diagnosis. Immunization during chemotherapy or radiation therapy should be avoided. Routine use of PPSV23 vaccine is not recommended for American Indians, [place]atives, or people younger than 65 years unless there are medical conditions that require PPSV23 vaccine. When indicated, people who have unknown immunization and have no record of immunization should receive PPSV23 vaccine. One-time revaccination 5 years after the first dose of PPSV23 is recommended for people aged 19-64 years who have chronic kidney failure, nephrotic syndrome, asplenia, or immunocompromised conditions. People who received 1-2 doses of PPSV23 before age 4 years should receive another dose of PPSV23 vaccine at age 50 years or later if at least 5 years have passed since the previous dose. Doses of PPSV23 are not needed  for people immunized with PPSV23 at or after age 18 years.   Preventive Services / Frequency  Ages 19 years and over  Blood pressure check.  Lipid and cholesterol check.  Lung cancer screening. / Every year if you are aged 109-80 years and have a 30-pack-year history of smoking and currently smoke or have quit within the past 15 years. Yearly screening is stopped once you have quit smoking for at least 15 years or develop a health problem that would prevent you from having lung cancer treatment.  Clinical breast exam.** / Every year after age 74 years.  BRCA-related cancer risk assessment.** / For women who have family members with a BRCA-related cancer (breast, ovarian, tubal, or peritoneal cancers).  Mammogram.** / Every year beginning at age 82 years and continuing for as long as you are in good health. Consult with your health care provider.  Pap test.** / Every 3 years starting at age 70 years through age 27 or 8 years with 3 consecutive normal Pap tests. Testing can be stopped between 65 and 70 years with 3 consecutive normal Pap tests and no abnormal Pap or HPV tests in the past 10 years.  Fecal occult blood test (FOBT) of stool. / Every year beginning at age 30 years and continuing until age 51 years. You may not need to do this test if you get a colonoscopy every 10 years.  Flexible sigmoidoscopy or colonoscopy.** / Every 5 years for a flexible sigmoidoscopy or every 10 years for a colonoscopy beginning at age 5 years and continuing until age 36 years.  Hepatitis C blood test.** / For all people born from [birth year] through 1964 and any individual with known risks for hepatitis C.  Osteoporosis screening.** / A one-time screening for women ages 48 years and over and women at risk for fractures or osteoporosis.  Skin self-exam. / Monthly.  Influenza vaccine. / Every year.  Tetanus, diphtheria, and acellular pertussis (Tdap/Td) vaccine.** / 1 dose of Td every 10 years.  Zoster  vaccine.** / 1 dose for adults aged 72 years or older.  Pneumococcal 13-valent conjugate (PCV13) vaccine.** / Consult your health care provider.  Pneumococcal polysaccharide (PPSV23) vaccine.** / 1 dose for all adults aged 31 years and older. Screening for abdominal aortic aneurysm (AAA)  by ultrasound is recommended for people who have history of high blood pressure or who are current or former smokers.

## 2015-01-13 NOTE — Progress Notes (Signed)
Medicare Wellness and CPE  Assessment:   1. Essential hypertension - continue medications, cut naldolol in half due to bradycardia, DASH diet, exercise and monitor at home. Call if greater than 130/80.  - CBC with Differential/Platelet - BASIC METABOLIC PANEL WITH GFR - Hepatic function panel - TSH - Urinalysis, Routine w reflex microscopic (not at [facility]) - Microalbumin / creatinine urine ratio - EKG 12-Lead  2. Type 2 diabetes mellitus with diabetic chronic kidney disease - Insulin, fasting - Hemoglobin A1c - HM DIABETES FOOT EXAM  3. Hyperlipidemia - Lipid panel  4. Obesity Obesity with co morbidities- long discussion about weight loss, diet, and exercise  5. At high risk for falls Declines PT at this time, continue wheel chair use and fall education provided  6. Chronic obstructive pulmonary disease, unspecified COPD, unspecified chronic bronchitis type Has finally quit smoking, continue O2.  Smoking cessation-  commended patient for quitting and reviewed strategies for preventing relapses  7. Medication management - Magnesium  8. Vitamin D deficiency - Vit D  25 hydroxy (rtn osteoporosis monitoring)  9. Peripheral edema Continue lasix, check BMP  10. Joint swelling Given cochicine samples to try - Uric acid  11. Chronic pain syndrome Declines referral at this time Increase elavil to 2 at night and 1 in AM May need to switch gabapentin to lyrica, RX written   12. Anemia, unspecified anemia type Check CBC  13. Fibromyalgia Continue medications.   14. Esophageal varices Monitor, follow up Dr. Deatra Ina.   15. COLONIC POLYPS Monitor.   16. Diverticulosis of large intestine without hemorrhage + LLQ tenderness but no rebound, check labs, bowel rest, and go to ER if worse  17. Dysphagia Soft diet  18. Esophagitis Continue PPI  19. GERD Continue PPI  20. Cirrhosis of liver without ascites, unspecified hepatic cirrhosis type Continue naldalol, meds  and follow up Dr. Deatra Ina.  May need to cut back on naldalol to 1/2 daily  Check labs, avoid tylenol, alcohol, weight loss advised.   21. IBS /opioid induced constipation, amitza samples given.    Plan:   During the course of the visit the patient was educated and counseled about appropriate screening and preventive services including:    Pneumococcal vaccine   Influenza vaccine  Td vaccine  Screening electrocardiogram  Screening mammography  Bone densitometry screening  Colorectal cancer screening  Diabetes screening  Glaucoma screening  Nutrition counseling   Smoking cessation counseling   Conditions/risks identified: BMI: Discussed weight loss, diet, and increase physical activity.  Increase physical activity: AHA recommends 150 minutes of physical activity a week.  Medications reviewed DEXA- declined Diabetes at goal, ACE/ARB therapy Yes. Urinary Incontinence is an issue: discussed non pharmacology and pharmacology options.  Fall risk: high- discussed PT, home fall assessment, medications.   Subjective:   [name] is a 69 y.o. female who presents for Medicare Annual Wellness Visit and complete physical.    Date of last medicare wellness visit was 12/01/2013  Her blood pressure has been controlled at home, today their BP is BP: 110/62 mmHg She does not workout.  She denies chest pain, dizziness. Still has some SOB due COPD.  She has severe COPD, O2 dependent, has not been smoking x 2 months.  She has had right leg swelling, she had a negative [place] for DVT, started on lasix 73m BID, swelling has improved but she continues to have sharp/stabbing pain bilateral legs, worse at night. She did injure her left hip with a fall last year,  leg pain is worse when she lays on left side. She has chronic lower back pain, states she can not stand for over 5 mins due to pain and it "locking" up. She use to see Dr. Hardin Negus in the past for pain management but she is not  on anymore. She is on tylenol #3, 3-4 a day, elavil 89m BID, and gabapentin 3055mBID (was on TID but she had imbalance so decreased to BID).  Has had ortho, neuro referral too.  She is not on cholesterol medication and denies myalgias. Her cholesterol is at goal. The cholesterol last visit was:   Lab Results  Component Value Date   CHOL 173 09/21/2014   HDL 34* 09/21/2014   LDLCALC 81 09/21/2014   TRIG 290* 09/21/2014   CHOLHDL 5.1 09/21/2014    Last A1C in the office was:  Lab Results  Component Value Date   HGBA1C 5.1 09/21/2014   Patient is on Vitamin D supplement.   She has esophageal varices and cirrohsis being treated by Dr. KaDeatra Ina Has seen Dr. RoConstance Holstern the past.  High fall risk, has had 2 falls this past year, no fractures but + injuries, she is in a wheel chair.   Names of Other Physician/Practitioners you currently use: 1. [facility]- here for primary care 2. Dr. GrPatrici Rankseye doctor, last visit 2 years 3. Dr. McJohnella Moloneydentist, last visit once yearly, saw last week Patient Care Team: WiUnk PintoMD as PCP - General (Internal Medicine) RoInda CastleMD as Consulting Physician (Gastroenterology) JeIzora GalaMD as Consulting Physician (Otolaryngology)   Medication Review Current Outpatient Prescriptions on File Prior to Visit  Medication Sig Dispense Refill  . acetaminophen-codeine (TYLENOL #3) 300-30 MG per tablet TAKE 0.5-1 TABLET BY MOUTH UP TO FOUR TIMES DAILY AS NEEDED 120 tablet 0  . amitriptyline (ELAVIL) 10 MG tablet TAKE 1 TABLET BY MOUTH 3 TIMES A DAY AS NEEDED 90 tablet 2  . aspirin 81 MG tablet Take 81 mg by mouth daily.    . benzonatate (TESSALON) 100 MG capsule TAKE 1 CAPSULE (100 MG TOTAL) BY MOUTH 3 (THREE) TIMES DAILY AS NEEDED FOR COUGH. 100 capsule 1  . Cholecalciferol (VITAMIN D-3) 1000 UNITS CAPS Take 5,000 Units by mouth daily.    . citalopram (CELEXA) 40 MG tablet TAKE 1 TABLET BY MOUTH TWICE A  DAY  FOR MOOD 60 tablet 3  . cyclobenzaprine (FLEXERIL) 10 MG tablet Take 1 tablet (10 mg total) by mouth 3 (three) times daily as needed for muscle spasms. 30 tablet 0  . ferrous sulfate 325 (65 FE) MG tablet TAKE 1 TABLET TWICE DAILY (Patient taking differently: TAKE 1 TABLET ONCE DAILY) 60 tablet 5  . furosemide (LASIX) 40 MG tablet Take 1 tablet (40 mg total) by mouth daily. For fluid 30 tablet 11  . furosemide (LASIX) 80 MG tablet Take 1 tablet (80 mg total) by mouth daily. 30 tablet 11  . gabapentin (NEURONTIN) 300 MG capsule TAKE ONE CAPSULE 3 TIMES A DAY (Patient taking differently: TAKE ONE CAPSULE 4 TIMES A DAY) 90 capsule 11  . ipratropium (ATROVENT) 0.02 % nebulizer solution Take 2.5 mLs (0.5 mg total) by nebulization every 4 (four) hours as needed for wheezing or shortness of breath. 75 mL 5  . Magnesium 250 MG TABS Take 250 mg by mouth daily at 6 (six) AM.    . metFORMIN (GLUCOPHAGE-XR) 500 MG 24 hr tablet TAKE 2 TABLETS BY MOUTH TWICE DAILY  AFTER MEALS 360 tablet 3  . nadolol (CORGARD) 40 MG tablet Take 1 tablet (40 mg total) by mouth daily. 90 tablet 1  . Omega-3 Fatty Acids (FISH OIL) 1000 MG CAPS Take 1,000 mg by mouth 2 (two) times daily.    Marland Kitchen OVER THE COUNTER MEDICATION Take 1-2 tablets by mouth as needed (constipation). Natural Veg Laxative    . OXYGEN Inhale 3 L/min into the lungs continuous as needed. On exertion and at home    . pantoprazole (PROTONIX) 40 MG tablet Take 1 tablet (40 mg total) by mouth 2 (two) times daily. 180 tablet 0  . pantoprazole (PROTONIX) 40 MG tablet Take 1 tablet (40 mg total) by mouth 2 (two) times daily. 180 tablet 1  . predniSONE (DELTASONE) 20 MG tablet 3 tabs po day one, then 2 tabs daily x 4 days 11 tablet 0  . spironolactone (ALDACTONE) 25 MG tablet Take 1 tablet (25 mg total) by mouth 2 (two) times daily. For fluids and swelling (Patient taking differently: Take 1 tablet (25 mg total) by mouth once daily.) 90 tablet 0  . tamoxifen (NOLVADEX) 20 MG  tablet Take 1 tablet (20 mg total) by mouth daily. 90 tablet 11   No current facility-administered medications on file prior to visit.    Current Problems (verified) Patient Active Problem List   Diagnosis Date Noted  . Peripheral edema 12/17/2014  . Obesity 09/21/2014  . Medication management 12/14/2013  . Hypertension   . Hyperlipidemia   . Asthma   . COPD (chronic obstructive pulmonary disease)   . GERD   . Vitamin D deficiency   . IBS   . Fibromyalgia   . Dysphagia 10/21/2013  . T2_NIDDM w/Stage 2 CKD (GFR 67 ml/min) 08/31/2013  . Esophageal varices without mention of bleeding 07/08/2012  . Early satiety 07/08/2012  . COLONIC POLYPS 11/11/2008  . ANEMIA 05/28/2008  . Hepatic cirrhosis 05/28/2008  . ESOPHAGITIS 05/27/2008  . Diverticulosis 05/27/2008    Screening Tests Health Maintenance  Topic Date Due  . OPHTHALMOLOGY EXAM  11/11/1955  . ZOSTAVAX  11/12/2006  . DEXA SCAN  11/12/2011  . MAMMOGRAM  03/13/2013  . PNA vac Low Risk Adult (2 of 2 - PCV13) 08/28/2014  . FOOT EXAM  12/02/2014  . URINE MICROALBUMIN  12/02/2014  . INFLUENZA VACCINE  03/07/2015  . HEMOGLOBIN A1C  03/22/2015  . COLONOSCOPY  07/20/2018  . TETANUS/TDAP  11/20/2022    Immunization History  Administered Date(s) Administered  . Hep A / Hep B 11/05/2013, 11/12/2013, 12/07/2013, 12/31/2014  . Influenza Split 05/26/2013, 06/17/2014  . Pneumococcal Polysaccharide-23 08/28/2013  . Tdap 11/19/2012    Preventative care: Last colonoscopy: 2009 EGD: 10/2013 Last mammogram: 2013- patient states it hurts her right arm too much.  Last pap smear/pelvic exam: remote DEXA: 12/2012  CT head 08/2014 CT neck: 08/2013 CXR 11/2014 Gastric emptying 2014 US soft tissue head /022016  Prior vaccinations: TD or Tdap: 2014  Influenza: 2015 Pneumococcal: 2015 Prevnar 13: out of in the office, DUE Shingles/Zostavax: declines  Allergies Allergies  Allergen Reactions  . Atorvastatin   .  Diphenhydramine Hcl   . Hydrocodone-Acetaminophen   . Loratadine-Pseudoephedrine Er   . Lorazepam   . Simvastatin   . Sulfonamide Derivatives    Surgical history Past Surgical History  Procedure Laterality Date  . Hip arthroplasty Bilateral   . Shoulder surgery Right   . Back surgery    . Cholecystectomy    . Neck surgery    .  Esophagogastroduodenoscopy  07/16/2012    Procedure: ESOPHAGOGASTRODUODENOSCOPY (EGD);  Surgeon: Inda Castle, MD;  Location: [facility];  Service: Endoscopy;  Laterality: N/A;  . Gastric varices banding  07/16/2012    Procedure: GASTRIC VARICES BANDING;  Surgeon: Inda Castle, MD;  Location: [facility];  Service: Endoscopy;  Laterality: N/A;   Family history Family History  Problem Relation Age of Onset  . Colon cancer Neg Hx   . Diabetes Brother   . Heart disease Sister     A Fib  . Heart disease Mother    Risk Factors: Osteoporosis: postmenopausal estrogen deficiency and dietary calcium and/or vitamin D deficiency History of fracture in the past year: no  Tobacco History  Substance Use Topics  . Smoking status: Former Smoker -- 1.00 packs/day for 50 years    Types: Cigarettes    Quit date: 11/05/2014  . Smokeless tobacco: Never Used     Comment: Tobacco info given 10/21/13  . Alcohol Use: 0.0 oz/week    0 Standard drinks or equivalent per week     Comment: rarely   She does not smoke.  She is a former smoker x 2 months.  Are there smokers in your home (other than you)?  Yes- several of her kids and grand daughter  Alcohol Current alcohol use: social drinker  Caffeine Current caffeine use: coffee 1-2 /day  Exercise Current exercise habits: The patient does not participate in regular exercise at present.  Current exercise: no regular exercise  Nutrition/Diet Current diet: in general, a "healthy" diet    Cardiac risk factors: advanced age (older than 21 for men, 25 for women), diabetes mellitus, dyslipidemia, family history  of premature cardiovascular disease, hypertension, sedentary lifestyle and smoking/ tobacco exposure.  Depression Screen  Q1: Over the past two weeks, have you felt down, depressed or hopeless?No  Q2: Over the past two weeks, have you felt little interest or pleasure in doing things? No  Have you lost interest or pleasure in daily life? No  Do you often feel hopeless? No  Do you cry easily over simple problems? No  Activities of Daily Living In your present state of health, do you have any difficulty performing the following activities?:  Driving? yes Managing money?  No Feeding yourself? No Getting from bed to chair? Yes Climbing a flight of stairs? Yes Preparing food and eating?: No Bathing or showering? No Getting dressed: No Getting to the toilet? No Using the toilet:No Moving around from place to place: Yes In the past year have you fallen or had a near fall?: YES   Are you sexually active?  No  Do you have more than one partner?  No  Vision Difficulties: Yes  Hearing Difficulties: Yes Do you often ask people to speak up or repeat themselves? Yes Do you experience ringing or noises in your ears? No Do you have difficulty understanding soft or whispered voices? Yes  Cognition  Do you feel that you have a problem with memory?No  Do you often misplace items? Yes  Do you feel safe at home?  Yes  Advanced directives Does patient have a [place]? No Does patient have a Living Will? No  Review of Systems  Constitutional: Positive for malaise/fatigue. Negative for fever, chills, weight loss and diaphoresis.  HENT: Positive for hearing loss (wears hearing aids). Negative for congestion, ear discharge, ear pain, nosebleeds, sore throat and tinnitus.   Eyes: Negative.   Respiratory: Positive for shortness of breath and  wheezing. Negative for cough, hemoptysis, sputum production and stridor.   Cardiovascular: Positive for orthopnea, claudication and leg  swelling. Negative for chest pain, palpitations and PND.  Gastrointestinal: Positive for constipation (is passing gas, no BM x 4 days). Negative for heartburn, nausea, vomiting, abdominal pain, diarrhea, blood in stool and melena.  Genitourinary: Positive for dysuria and frequency. Negative for urgency, hematuria and flank pain.  Musculoskeletal: Positive for myalgias, back pain, joint pain and falls. Negative for neck pain.  Skin: Negative.   Neurological: Positive for dizziness and weakness. Negative for tingling, tremors, sensory change, speech change, focal weakness, seizures, loss of consciousness and headaches.  Psychiatric/Behavioral: Positive for depression. Negative for suicidal ideas, hallucinations, memory loss and substance abuse. The patient is not nervous/anxious and does not have insomnia.     Objective:   Blood pressure 110/62, pulse 52, temperature 97.7 F (36.5 C), resp. rate 16, height 5' 2.5" (1.588 m), weight 161 lb (73.029 kg). Body mass index is 28.96 kg/(m^2).  General appearance: alert, no distress, WD/WN,  female Cognitive Testing  Alert? Yes  Normal Appearance?Yes  Oriented to person? Yes  Place? Yes   Time? Yes  Recall of three objects?  No 2/3  Can perform simple calculations? Yes  Displays appropriate judgment?Yes  Can read the correct time from a watch face?Yes  HEENT: normocephalic, sclerae anicteric, TMs pearly, nares patent, no discharge or erythema, pharynx normal, decreased hearing does not have hearing aids in at this time, O2 in place Oral cavity: MMM, no lesions Neck: supple, small unchanged tender mobile left anterior lymphadenopathy, no thyromegaly Heart: RRR, normal S1, S2, no murmurs Lungs:decrease breath sounds with mild wheezing.  Abdomen: +bs, soft, tender right upper quadrant and tender LLQ without rebound, + distended, no masses, no hepatomegaly, no splenomegaly Musculoskeletal: + left knee with swelling, warmth, tenderness, and right  foot with warm, swollen ankle with edema,  no obvious deformity Extremities: 1-2+  Edema right foot, no cyanosis, no clubbing Pulses: see foot exam Neurological: alert, oriented x 3, CN2-12 intact, strength normal upper extremities and lower extremities, sensation decreased to shins, DTRs 2+ throughout, no cerebellar signs, in a wheel chair Psychiatric: normal affect, behavior normal, pleasant  Breast: no masses.  Gyn: defer Rectal: defer  Medicare Attestation I have personally reviewed: The patient's medical and social history Their use of alcohol, tobacco or illicit drugs Their current medications and supplements The patient's functional ability including ADLs,fall risks, home safety risks, cognitive, and hearing and visual impairment Diet and physical activities Evidence for depression or mood disorders  The patient's weight, height, BMI, and visual acuity have been recorded in the chart.  I have made referrals, counseling, and provided education to the patient based on review of the above and I have provided the patient with a written personalized care plan for preventive services.     Vicie Mutters, PA-C   01/13/2015

## 2015-01-14 LAB — URINALYSIS, ROUTINE W REFLEX MICROSCOPIC
BILIRUBIN URINE: NEGATIVE
Glucose, UA: NEGATIVE mg/dL
Hgb urine dipstick: NEGATIVE
KETONES UR: NEGATIVE mg/dL
Leukocytes, UA: NEGATIVE
Nitrite: NEGATIVE
Protein, ur: NEGATIVE mg/dL
SPECIFIC GRAVITY, URINE: 1.012 (ref 1.005–1.030)
UROBILINOGEN UA: 1 mg/dL (ref 0.0–1.0)
pH: 6 (ref 5.0–8.0)

## 2015-01-14 LAB — BASIC METABOLIC PANEL WITH GFR
BUN: 27 mg/dL — ABNORMAL HIGH (ref 6–23)
CALCIUM: 9.2 mg/dL (ref 8.4–10.5)
CHLORIDE: 99 meq/L (ref 96–112)
CO2: 35 mEq/L — ABNORMAL HIGH (ref 19–32)
CREATININE: 1.3 mg/dL — AB (ref 0.50–1.10)
GFR, EST AFRICAN AMERICAN: 49 mL/min — AB
GFR, Est Non African American: 42 mL/min — ABNORMAL LOW
Glucose, Bld: 82 mg/dL (ref 70–99)
Potassium: 4.3 mEq/L (ref 3.5–5.3)
Sodium: 143 mEq/L (ref 135–145)

## 2015-01-14 LAB — CBC WITH DIFFERENTIAL/PLATELET
BASOS PCT: 0 % (ref 0–1)
Basophils Absolute: 0 10*3/uL (ref 0.0–0.1)
EOS ABS: 0.1 10*3/uL (ref 0.0–0.7)
EOS PCT: 3 % (ref 0–5)
HCT: 36.3 % (ref 36.0–46.0)
Hemoglobin: 12 g/dL (ref 12.0–15.0)
LYMPHS PCT: 38 % (ref 12–46)
Lymphs Abs: 1.8 10*3/uL (ref 0.7–4.0)
MCH: 31.1 pg (ref 26.0–34.0)
MCHC: 33.1 g/dL (ref 30.0–36.0)
MCV: 94 fL (ref 78.0–100.0)
MPV: 10.8 fL (ref 8.6–12.4)
Monocytes Absolute: 0.4 10*3/uL (ref 0.1–1.0)
Monocytes Relative: 8 % (ref 3–12)
Neutro Abs: 2.4 10*3/uL (ref 1.7–7.7)
Neutrophils Relative %: 51 % (ref 43–77)
Platelets: 86 10*3/uL — ABNORMAL LOW (ref 150–400)
RBC: 3.86 MIL/uL — AB (ref 3.87–5.11)
RDW: 14.1 % (ref 11.5–15.5)
WBC: 4.8 10*3/uL (ref 4.0–10.5)

## 2015-01-14 LAB — INSULIN, FASTING: INSULIN FASTING, SERUM: 25 u[IU]/mL — AB (ref 2.0–19.6)

## 2015-01-14 LAB — MAGNESIUM: Magnesium: 2.3 mg/dL (ref 1.5–2.5)

## 2015-01-14 LAB — TSH: TSH: 1.224 u[IU]/mL (ref 0.350–4.500)

## 2015-01-14 LAB — LIPID PANEL
CHOLESTEROL: 217 mg/dL — AB (ref 0–200)
HDL: 36 mg/dL — AB (ref >=46)
LDL CALC: 118 mg/dL — AB (ref 0–99)
Total CHOL/HDL Ratio: 6 Ratio
Triglycerides: 313 mg/dL — ABNORMAL HIGH (ref <150)
VLDL: 63 mg/dL — AB (ref 0–40)

## 2015-01-14 LAB — HEPATIC FUNCTION PANEL
ALK PHOS: 72 U/L (ref 39–117)
ALT: 16 U/L (ref 0–35)
AST: 23 U/L (ref 0–37)
Albumin: 3.9 g/dL (ref 3.5–5.2)
Bilirubin, Direct: 0.1 mg/dL (ref 0.0–0.3)
Indirect Bilirubin: 0.6 mg/dL (ref 0.2–1.2)
Total Bilirubin: 0.7 mg/dL (ref 0.2–1.2)
Total Protein: 6.2 g/dL (ref 6.0–8.3)

## 2015-01-14 LAB — VITAMIN D 25 HYDROXY (VIT D DEFICIENCY, FRACTURES): VIT D 25 HYDROXY: 64 ng/mL (ref 30–100)

## 2015-01-14 LAB — URIC ACID: Uric Acid, Serum: 12.1 mg/dL — ABNORMAL HIGH (ref 2.4–7.0)

## 2015-01-14 LAB — MICROALBUMIN / CREATININE URINE RATIO
CREATININE, URINE: 85 mg/dL
Microalb, Ur: <0.2 mg/dL (ref <2.0)

## 2015-01-14 LAB — HEMOGLOBIN A1C
HEMOGLOBIN A1C: 5.4 % (ref <5.7)
MEAN PLASMA GLUCOSE: 108 mg/dL (ref <117)

## 2015-01-14 MED ORDER — PREDNISONE 20 MG PO TABS: ORAL_TABLET | ORAL | Status: DC

## 2015-01-14 MED ORDER — ALLOPURINOL 300 MG PO TABS: ORAL_TABLET | ORAL | Status: DC

## 2015-01-14 MED ORDER — COLCHICINE 0.6 MG PO TABS
0.6000 mg | ORAL_TABLET | Freq: Every day | ORAL | Status: DC
Start: 2015-01-14 — End: 2015-03-09

## 2015-01-14 NOTE — Addendum Note (Signed)
Addended by: Vicie Mutters R on: 01/14/2015 09:46 AM   Modules accepted: Orders, SmartSet

## 2015-01-17 ENCOUNTER — Ambulatory Visit: Payer: PPO | Admitting: *Deleted

## 2015-01-17 ENCOUNTER — Other Ambulatory Visit: Payer: Self-pay | Admitting: Internal Medicine

## 2015-01-18 ENCOUNTER — Other Ambulatory Visit: Payer: Self-pay | Admitting: *Deleted

## 2015-01-18 NOTE — Addendum Note (Signed)
Addended by: Rudi Bunyard A on: 01/18/2015 08:12 AM   Modules accepted: Level of Service

## 2015-01-27 ENCOUNTER — Encounter: Payer: Self-pay | Admitting: Internal Medicine

## 2015-01-27 ENCOUNTER — Ambulatory Visit (INDEPENDENT_AMBULATORY_CARE_PROVIDER_SITE_OTHER): Payer: PPO | Admitting: Internal Medicine

## 2015-01-27 VITALS — BP 118/78 | HR 62 | Temp 98.2°F | Resp 16 | Ht 62.5 in

## 2015-01-27 DIAGNOSIS — Z9660 Presence of unspecified orthopedic joint implant: Secondary | ICD-10-CM

## 2015-01-27 DIAGNOSIS — Z96649 Presence of unspecified artificial hip joint: Secondary | ICD-10-CM

## 2015-01-27 DIAGNOSIS — K5900 Constipation, unspecified: Secondary | ICD-10-CM

## 2015-01-27 DIAGNOSIS — M25562 Pain in left knee: Secondary | ICD-10-CM

## 2015-01-27 DIAGNOSIS — R7989 Other specified abnormal findings of blood chemistry: Secondary | ICD-10-CM

## 2015-01-27 DIAGNOSIS — E790 Hyperuricemia without signs of inflammatory arthritis and tophaceous disease: Secondary | ICD-10-CM

## 2015-01-27 MED ORDER — LUBIPROSTONE 8 MCG PO CAPS: 8.0000 ug | ORAL_CAPSULE | Freq: Two times a day (BID) | ORAL | Status: DC

## 2015-01-27 MED ORDER — PREDNISONE 20 MG PO TABS
ORAL_TABLET | ORAL | Status: DC
Start: 2015-01-27 — End: 2015-05-11

## 2015-01-27 NOTE — Patient Instructions (Signed)
I think your knee pain is due to osteoarthritis vs. Gout.  You can continue your allopurinol and colchicine.  I would like you take to 10-20 mg prednisone every other day to help with the knee pain and swelling.  I am putting in a referral for you to see Dr. Maureen Ralphs.  We will call when that appointment is set up.  Once set up you can call to change it as you need to to fit your schedule.  We will see you back at your next scheduled appointment.  Please make sure she is taking the elavil 1 tablet in the morning and 2 tablets at night time.  All other medications will stay the same at this point in time unless you would like to try going up to 3 times daily on lyrica.

## 2015-01-27 NOTE — Progress Notes (Signed)
   Subjective:    Patient ID: [name], female    DOB: Aug 21, 1945, 69 y.o.   MRN: [id number]  HPI  Patient presents to the office for evaluation of left knee pain which has been intermittently swelling for the past few months.  She reports that it has been getting a little worse lately.  She reports that when it gets really swollen and tight it gets painful.  She reports that when she was walking around on her left leg last week it felt like it was trying to buckle.  She reports that she does have a left hip replacement.  She states that Dr. Maureen Ralphs put her hip in.  She reports that she has not seen Dr. Maureen Ralphs any time recently.  Leg has not been hot red or warm to the touch.  She is taking allopurinol and also the colchicine.  Uric acid was checked at last visit and was found to be 12.1.  She was given cochicine and allopurinol which has helped.     Review of Systems  Constitutional: Negative for fever, chills and fatigue.  Gastrointestinal: Negative for nausea and vomiting.  Musculoskeletal: Positive for joint swelling, arthralgias and gait problem.  Neurological: Positive for weakness. Negative for dizziness and numbness.       Objective:   Physical Exam  Constitutional: She is oriented to person, place, and time. She appears well-developed and well-nourished. No distress.  In wheel chair  HENT:  Head: Normocephalic and atraumatic.  Mouth/Throat: Oropharynx is clear and moist. No oropharyngeal exudate.  Eyes: Conjunctivae are normal. No scleral icterus.  Neck: Normal range of motion. Neck supple. No JVD present. No thyromegaly present.  Cardiovascular: Normal rate, regular rhythm, normal heart sounds and intact distal pulses.  Exam reveals no gallop and no friction rub.   No murmur heard. Pulmonary/Chest: Effort normal and breath sounds normal. No respiratory distress. She has no wheezes. She has no rales. She exhibits no tenderness.  Musculoskeletal:       Left knee: She  exhibits normal range of motion, no swelling, no effusion, no ecchymosis, no deformity, no laceration, no erythema, normal alignment, no LCL laxity, normal patellar mobility, no bony tenderness, normal meniscus and no MCL laxity. Tenderness found. Medial joint line and lateral joint line tenderness noted. No MCL, no LCL and no patellar tendon tenderness noted.  Lymphadenopathy:    She has no cervical adenopathy.  Neurological: She is alert and oriented to person, place, and time.  Skin: Skin is warm and dry. She is not diaphoretic.  Psychiatric: She has a normal mood and affect. Her behavior is normal. Judgment and thought content normal.  Nursing note and vitals reviewed.         Assessment & Plan:    1. Left knee pain -cont allopurinol -cont colchicine -prednisone 10-20 mg every other day -go back to Dr. Maureen Ralphs  2. Constipation, unspecified constipation type -amatiza samples given -prescription sent in to pharmacy -working well  3. Elevated uric acid in blood -cont colchicine -cont allopurinol  4. Status post THR (total hip replacement) -Recheck with [facility]

## 2015-02-08 ENCOUNTER — Other Ambulatory Visit: Payer: Self-pay | Admitting: Internal Medicine

## 2015-02-14 ENCOUNTER — Telehealth: Payer: Self-pay | Admitting: *Deleted

## 2015-02-14 NOTE — Telephone Encounter (Signed)
Left message at patient's pharmacy that insurance will not cover Amitriptyline per Dr Melford Aase and patient will have to pay cash for the med.

## 2015-02-16 ENCOUNTER — Encounter: Payer: Self-pay | Admitting: Physician Assistant

## 2015-02-16 ENCOUNTER — Encounter: Payer: Self-pay | Admitting: Internal Medicine

## 2015-02-16 ENCOUNTER — Ambulatory Visit (INDEPENDENT_AMBULATORY_CARE_PROVIDER_SITE_OTHER): Payer: PPO | Admitting: Internal Medicine

## 2015-02-16 VITALS — BP 112/64 | HR 60 | Temp 97.9°F | Resp 16

## 2015-02-16 DIAGNOSIS — Z79899 Other long term (current) drug therapy: Secondary | ICD-10-CM

## 2015-02-16 DIAGNOSIS — E1129 Type 2 diabetes mellitus with other diabetic kidney complication: Secondary | ICD-10-CM

## 2015-02-16 DIAGNOSIS — M1000 Idiopathic gout, unspecified site: Secondary | ICD-10-CM

## 2015-02-16 DIAGNOSIS — I10 Essential (primary) hypertension: Secondary | ICD-10-CM

## 2015-02-16 DIAGNOSIS — E1122 Type 2 diabetes mellitus with diabetic chronic kidney disease: Secondary | ICD-10-CM

## 2015-02-16 DIAGNOSIS — Z6828 Body mass index (BMI) 28.0-28.9, adult: Secondary | ICD-10-CM

## 2015-02-16 NOTE — Progress Notes (Signed)
Subjective:    Patient ID: [name], female    DOB: [birth year], 69 y.o.   MRN: [id number]  HPI Patient returns today for f/u of recent acute got and starting Allopurinal at 1/2 dose as she also was found to have worsening CKD with BUN/Creat rising from 15/0.85 to 27/1.30 and GFR dropping from 71 to 42 ml/min and she was advised to increase po fluids, cut her lasix back to once daily and avoid NSAID's. Also colchicine wa added and her Naldolol was decreased due to relative bradycardia. Patient reports feels better, but still some Left knee pains. Denies HA's, dizziness, CP, palpitations, PND/Orthopnea or worsening edema.    Medication Sig  . acetaminophen-codeine (TYLENOL #3) 300-30 MG per tablet TAKE 0.5-1 TABLET BY MOUTH UP TO FOUR TIMES DAILY AS NEEDED  . allopurinol (ZYLOPRIM) 300 MG tablet 1/2-1 tablet daily for gout  . amitriptyline (ELAVIL) 10 MG tablet TAKE 1 TABLET BY MOUTH 3 TIMES A DAY AS NEEDED  . aspirin 81 MG tablet Take 81 mg by mouth daily.  . benzonatate (TESSALON) 100 MG capsule TAKE 1 CAPSULE (100 MG TOTAL) BY MOUTH 3 (THREE) TIMES DAILY AS NEEDED FOR COUGH.  . Cholecalciferol (VITAMIN D-3) 1000 UNITS CAPS Take 5,000 Units by mouth daily.  . citalopram (CELEXA) 40 MG tablet TAKE 1 TABLET BY MOUTH TWICE A  DAY FOR MOOD  . colchicine 0.6 MG tablet Take 1 tablet (0.6 mg total) by mouth daily.  . cyclobenzaprine (FLEXERIL) 10 MG tablet Take 1 tablet (10 mg total) by mouth 3 (three) times daily as needed for muscle spasms.  . ferrous sulfate 325 (65 FE) MG tablet TAKE 1 TABLET TWICE DAILY (Patient taking differently: TAKE 1 TABLET ONCE DAILY)  . furosemide (LASIX) 80 MG tablet Take 1 tablet (80 mg total) by mouth 2 (two) times daily.  Marland Kitchen ipratropium (ATROVENT) 0.02 % nebulizer solution use 1 vial in nebulizer EVERY 4 HOURS AS NEEDED FOR WHEEZING OR SHORTNESS OF BREATH  . lubiprostone (AMITIZA) 8 MCG capsule Take 1 capsule (8 mcg total) by mouth 2 (two) times daily with a meal.   . Magnesium 250 MG TABS Take 250 mg by mouth daily at 6 (six) AM.  . metFORMIN (GLUCOPHAGE-XR) 500 MG 24 hr tablet TAKE 2 TABLETS BY MOUTH TWICE DAILY AFTER MEALS  . nadolol (CORGARD) 40 MG tablet Take 1 tablet (40 mg total) by mouth daily.  . Omega-3 Fatty Acids (FISH OIL) 1000 MG CAPS Take 1,000 mg by mouth 2 (two) times daily.  Marland Kitchen OVER THE COUNTER MEDICATION Take 1-2 tablets by mouth as needed (constipation). Natural Veg Laxative  . OXYGEN Inhale 3 L/min into the lungs continuous as needed. On exertion and at home  . pantoprazole (PROTONIX) 40 MG tablet Take 1 tablet (40 mg total) by mouth 2 (two) times daily.  . predniSONE (DELTASONE) 20 MG tablet Take 1/2 tablet to 1 tablet every other day until next office visit.  . pregabalin (LYRICA) 100 MG capsule Take 1 capsule (100 mg total) by mouth 3 (three) times daily.  Marland Kitchen spironolactone (ALDACTONE) 25 MG tablet Take 1 tablet (25 mg total) by mouth 2 (two) times daily. For fluids and swelling  . tamoxifen (NOLVADEX) 20 MG tablet Take 1 tablet (20 mg total) by mouth daily.  . furosemide (LASIX) 40 MG tablet Take 1 tablet by mouth daily   Allergies  Allergen Reactions  . Atorvastatin   . Diphenhydramine Hcl   . Hydrocodone-Acetaminophen   . Loratadine-Pseudoephedrine Er   .  Lorazepam   . Simvastatin   . Sulfonamide Derivatives    Past Medical History  Diagnosis Date  . Hx of shoulder replacement   . Diabetes mellitus     reports "borderline"  . Type II or unspecified type diabetes mellitus without mention of complication, not stated as uncontrolled 08/31/2013  . Hypertension   . Hyperlipidemia   . Asthma   . COPD (chronic obstructive pulmonary disease)   . GERD (gastroesophageal reflux disease)   . Vitamin D deficiency   . IBS (irritable bowel syndrome)   . Fibromyalgia    Past Surgical History  Procedure Laterality Date  . Hip arthroplasty Bilateral   . Shoulder surgery Right   . Back surgery    . Cholecystectomy    . Neck  surgery    . Esophagogastroduodenoscopy  07/16/2012    Procedure: ESOPHAGOGASTRODUODENOSCOPY (EGD);  Surgeon: Inda Castle, MD;  Location: [facility];  Service: Endoscopy;  Laterality: N/A;  . Gastric varices banding  07/16/2012    Procedure: GASTRIC VARICES BANDING;  Surgeon: Inda Castle, MD;  Location: [facility];  Service: Endoscopy;  Laterality: N/A;   Review of Systems 10 point systems review negative except as above.    Objective:   Physical Exam  BP 112/64 mmHg  Pulse 60  Temp(Src) 97.9 F (36.6 C)  Resp 16  HEENT - Eac's patent. TM's Nl. EOM's full. PERRLA. NasoOroPharynx clear. Neck - supple. Nl Thyroid. Carotids 2+ & No bruits, nodes, JVD Chest - Clear equal BS w/o Rales, rhonchi, wheezes. Cor - Nl HS. RRR w/o sig MGR. PP 1(+). No edema. Abd - No palpable organomegaly, masses or tenderness. BS nl. MS- FROM w/o deformities. Muscle power, tone and bulk Nl. Gait Nl. Neuro - No obvious Cr N abnormalities. Sensory, motor and Cerebellar functions appear Nl w/o focal abnormalities. Psyche - Mental status normal & appropriate.  No delusions, ideations or obvious mood abnormalities.    Assessment & Plan:   1. Essential hypertension   2. Type 2 diabetes mellitus with diabetic chronic kidney disease   3. Idiopathic gout, unspecified chronicity, unspecified site  - Uric acid  4. Medication management  - CBC with Differential/Platelet - BASIC METABOLIC PANEL WITH GFR - Magnesium  5. BMI 28.0-28.9,adult   6. Type 2 diabetes mellitus with other diabetic kidney complication  Discussed meds/SE's and disposition pending labs.

## 2015-02-17 ENCOUNTER — Ambulatory Visit: Payer: Self-pay

## 2015-02-17 LAB — BASIC METABOLIC PANEL WITH GFR
BUN: 18 mg/dL (ref 6–23)
CALCIUM: 9 mg/dL (ref 8.4–10.5)
CHLORIDE: 99 meq/L (ref 96–112)
CO2: 32 mEq/L (ref 19–32)
CREATININE: 1 mg/dL (ref 0.50–1.10)
GFR, EST NON AFRICAN AMERICAN: 58 mL/min — AB
GFR, Est African American: 67 mL/min
Glucose, Bld: 84 mg/dL (ref 70–99)
Potassium: 3.9 mEq/L (ref 3.5–5.3)
Sodium: 142 mEq/L (ref 135–145)

## 2015-02-17 LAB — CBC WITH DIFFERENTIAL/PLATELET
BASOS ABS: 0 10*3/uL (ref 0.0–0.1)
BASOS PCT: 1 % (ref 0–1)
EOS PCT: 2 % (ref 0–5)
Eosinophils Absolute: 0.1 10*3/uL (ref 0.0–0.7)
HCT: 36.7 % (ref 36.0–46.0)
Hemoglobin: 12.1 g/dL (ref 12.0–15.0)
Lymphocytes Relative: 34 % (ref 12–46)
Lymphs Abs: 1.5 10*3/uL (ref 0.7–4.0)
MCH: 32 pg (ref 26.0–34.0)
MCHC: 33 g/dL (ref 30.0–36.0)
MCV: 97.1 fL (ref 78.0–100.0)
MONO ABS: 0.4 10*3/uL (ref 0.1–1.0)
MONOS PCT: 9 % (ref 3–12)
MPV: 11 fL (ref 8.6–12.4)
NEUTROS PCT: 54 % (ref 43–77)
Neutro Abs: 2.3 10*3/uL (ref 1.7–7.7)
PLATELETS: 91 10*3/uL — AB (ref 150–400)
RBC: 3.78 MIL/uL — AB (ref 3.87–5.11)
RDW: 15.6 % — AB (ref 11.5–15.5)
WBC: 4.3 10*3/uL (ref 4.0–10.5)

## 2015-02-17 LAB — URIC ACID: Uric Acid, Serum: 4.4 mg/dL (ref 2.4–7.0)

## 2015-02-17 LAB — MAGNESIUM: Magnesium: 2.2 mg/dL (ref 1.5–2.5)

## 2015-03-03 ENCOUNTER — Other Ambulatory Visit: Payer: Self-pay | Admitting: *Deleted

## 2015-03-03 ENCOUNTER — Encounter: Payer: Self-pay | Admitting: Gastroenterology

## 2015-03-03 MED ORDER — ALLOPURINOL 300 MG PO TABS: ORAL_TABLET | ORAL | Status: DC

## 2015-03-03 MED ORDER — LUBIPROSTONE 8 MCG PO CAPS: 8.0000 ug | ORAL_CAPSULE | Freq: Two times a day (BID) | ORAL | Status: DC

## 2015-03-09 ENCOUNTER — Other Ambulatory Visit: Payer: Self-pay | Admitting: *Deleted

## 2015-03-09 MED ORDER — COLCHICINE 0.6 MG PO TABS: 0.6000 mg | ORAL_TABLET | Freq: Every day | ORAL | Status: DC

## 2015-03-10 ENCOUNTER — Other Ambulatory Visit: Payer: Self-pay | Admitting: *Deleted

## 2015-03-10 ENCOUNTER — Other Ambulatory Visit: Payer: Self-pay | Admitting: Internal Medicine

## 2015-03-10 MED ORDER — PREGABALIN 100 MG PO CAPS: 100.0000 mg | ORAL_CAPSULE | Freq: Three times a day (TID) | ORAL | Status: DC

## 2015-03-10 MED ORDER — SPIRONOLACTONE 25 MG PO TABS: ORAL_TABLET | ORAL | Status: DC

## 2015-03-28 ENCOUNTER — Other Ambulatory Visit: Payer: Self-pay | Admitting: Internal Medicine

## 2015-03-28 NOTE — Telephone Encounter (Signed)
Rx called into pharm

## 2015-04-15 ENCOUNTER — Ambulatory Visit (INDEPENDENT_AMBULATORY_CARE_PROVIDER_SITE_OTHER): Payer: PPO | Admitting: Internal Medicine

## 2015-04-15 ENCOUNTER — Encounter: Payer: Self-pay | Admitting: Internal Medicine

## 2015-04-15 VITALS — BP 122/66 | HR 64 | Temp 97.5°F | Resp 16 | Ht 62.5 in | Wt 166.0 lb

## 2015-04-15 DIAGNOSIS — E559 Vitamin D deficiency, unspecified: Secondary | ICD-10-CM | POA: Diagnosis not present

## 2015-04-15 DIAGNOSIS — M1000 Idiopathic gout, unspecified site: Secondary | ICD-10-CM

## 2015-04-15 DIAGNOSIS — J42 Unspecified chronic bronchitis: Secondary | ICD-10-CM

## 2015-04-15 DIAGNOSIS — I10 Essential (primary) hypertension: Secondary | ICD-10-CM | POA: Diagnosis not present

## 2015-04-15 DIAGNOSIS — E1129 Type 2 diabetes mellitus with other diabetic kidney complication: Secondary | ICD-10-CM

## 2015-04-15 DIAGNOSIS — E785 Hyperlipidemia, unspecified: Secondary | ICD-10-CM | POA: Diagnosis not present

## 2015-04-15 DIAGNOSIS — Z79899 Other long term (current) drug therapy: Secondary | ICD-10-CM | POA: Diagnosis not present

## 2015-04-15 DIAGNOSIS — E669 Obesity, unspecified: Secondary | ICD-10-CM

## 2015-04-15 DIAGNOSIS — Z6829 Body mass index (BMI) 29.0-29.9, adult: Secondary | ICD-10-CM

## 2015-04-15 LAB — BASIC METABOLIC PANEL WITH GFR
BUN: 22 mg/dL (ref 7–25)
CHLORIDE: 98 mmol/L (ref 98–110)
CO2: 32 mmol/L — ABNORMAL HIGH (ref 20–31)
Calcium: 8.9 mg/dL (ref 8.6–10.4)
Creat: 1.11 mg/dL — ABNORMAL HIGH (ref 0.50–0.99)
GFR, EST NON AFRICAN AMERICAN: 51 mL/min — AB (ref >=60)
GFR, Est African American: 59 mL/min — ABNORMAL LOW (ref >=60)
Glucose, Bld: 110 mg/dL — ABNORMAL HIGH (ref 65–99)
POTASSIUM: 4 mmol/L (ref 3.5–5.3)
SODIUM: 142 mmol/L (ref 135–146)

## 2015-04-15 LAB — LIPID PANEL
CHOL/HDL RATIO: 8.2 ratio — AB (ref <=5.0)
Cholesterol: 229 mg/dL — ABNORMAL HIGH (ref 125–200)
HDL: 28 mg/dL — AB (ref >=46)
TRIGLYCERIDES: 528 mg/dL — AB (ref <150)

## 2015-04-15 LAB — HEPATIC FUNCTION PANEL
ALK PHOS: 106 U/L (ref 33–130)
ALT: 27 U/L (ref 6–29)
AST: 30 U/L (ref 10–35)
Albumin: 3.6 g/dL (ref 3.6–5.1)
BILIRUBIN DIRECT: 0.1 mg/dL (ref <=0.2)
Indirect Bilirubin: 0.4 mg/dL (ref 0.2–1.2)
Total Bilirubin: 0.5 mg/dL (ref 0.2–1.2)
Total Protein: 5.6 g/dL — ABNORMAL LOW (ref 6.1–8.1)

## 2015-04-15 LAB — URIC ACID: Uric Acid, Serum: 5.9 mg/dL (ref 2.4–7.0)

## 2015-04-15 LAB — MAGNESIUM: MAGNESIUM: 2.2 mg/dL (ref 1.5–2.5)

## 2015-04-15 LAB — TSH: TSH: 1.196 u[IU]/mL (ref 0.350–4.500)

## 2015-04-15 NOTE — Patient Instructions (Signed)
Recommend Adult Low dose Aspirin or   coated  Aspirin 81 mg daily   To reduce risk of Colon Cancer 20 %,   Skin Cancer 26 % ,   Melanoma 46%   and   Pancreatic cancer 60%  ++++++++++++++++++  Vitamin D goal   is between 70-100.   Please make sure that you are taking your Vitamin D as directed.   It is very important as a natural anti-inflammatory   helping hair, skin, and nails, as well as reducing stroke and heart attack risk.   It helps your bones and helps with mood.  It also decreases numerous cancer risks so please take it as directed.   Low Vit D is associated with a 200-300% higher risk for CANCER   and 200-300% higher risk for HEART   ATTACK  &  STROKE.   .....................................Marland Kitchen  It is also associated with higher death rate at younger ages,   autoimmune diseases like Rheumatoid arthritis, Lupus, Multiple Sclerosis.     Also many other serious conditions, like depression, Alzheimer's  Dementia, infertility, muscle aches, fatigue, fibromyalgia - just to name a few.  +++++++++++++++++++  Recommend the book "The END of DIETING" by Dr Excell Seltzer   & the book "The END of DIABETES " by Dr Excell Seltzer  At [email or web address] - get book & Audio CD's     Being diabetic has a  300% increased risk for heart attack, stroke, cancer, and alzheimer- type vascular dementia. It is very important that you work harder with diet by avoiding all foods that are white. Avoid white rice (brown & wild rice is OK), white potatoes (sweetpotatoes in moderation is OK), White bread or wheat bread or anything made out of white flour like bagels, donuts, rolls, buns, biscuits, cakes, pastries, cookies, pizza crust, and pasta (made from white flour & egg whites) - vegetarian pasta or spinach or wheat pasta is OK. Multigrain breads like Arnold's or Pepper[employer], or multigrain sandwich thins or flatbreads.  Diet, exercise and weight loss can reverse and cure diabetes in the early  stages.  Diet, exercise and weight loss is very important in the control and prevention of complications of diabetes which affects every system in your body, ie. Brain - dementia/stroke, eyes - glaucoma/blindness, heart - heart attack/heart failure, kidneys - dialysis, stomach - gastric paralysis, intestines - malabsorption, nerves - severe painful neuritis, circulation - gangrene & loss of a leg(s), and finally cancer and Alzheimers.    I recommend avoid fried & greasy foods,  sweets/candy, white rice (brown or wild rice or Quinoa is OK), white potatoes (sweet potatoes are OK) - anything made from white flour - bagels, doughnuts, rolls, buns, biscuits,white and wheat breads, pizza crust and traditional pasta made of white flour & egg white(vegetarian pasta or spinach or wheat pasta is OK).  Multi-grain bread is OK - like multi-grain flat bread or sandwich thins. Avoid alcohol in excess. Exercise is also important.    Eat all the vegetables you want - avoid meat, especially red meat and dairy - especially cheese.  Cheese is the most concentrated form of trans-fats which is the worst thing to clog up our arteries. Veggie cheese is OK which can be found in the fresh produce section at [facility] or [employer] or [employer]  ++++++++++++++++++++++++++

## 2015-04-16 ENCOUNTER — Encounter: Payer: Self-pay | Admitting: Internal Medicine

## 2015-04-16 LAB — CBC WITH DIFFERENTIAL/PLATELET
BASOS ABS: 0 10*3/uL (ref 0.0–0.1)
BASOS PCT: 0 % (ref 0–1)
EOS ABS: 0.1 10*3/uL (ref 0.0–0.7)
Eosinophils Relative: 2 % (ref 0–5)
HCT: 38.4 % (ref 36.0–46.0)
Hemoglobin: 12.6 g/dL (ref 12.0–15.0)
Lymphocytes Relative: 34 % (ref 12–46)
Lymphs Abs: 1.5 10*3/uL (ref 0.7–4.0)
MCH: 31.3 pg (ref 26.0–34.0)
MCHC: 32.8 g/dL (ref 30.0–36.0)
MCV: 95.3 fL (ref 78.0–100.0)
MPV: 10.5 fL (ref 8.6–12.4)
Monocytes Absolute: 0.3 10*3/uL (ref 0.1–1.0)
Monocytes Relative: 6 % (ref 3–12)
NEUTROS PCT: 58 % (ref 43–77)
Neutro Abs: 2.6 10*3/uL (ref 1.7–7.7)
PLATELETS: 79 10*3/uL — AB (ref 150–400)
RBC: 4.03 MIL/uL (ref 3.87–5.11)
RDW: 15.2 % (ref 11.5–15.5)
WBC: 4.5 10*3/uL (ref 4.0–10.5)

## 2015-04-16 LAB — INSULIN, RANDOM: INSULIN: 116.2 u[IU]/mL — AB (ref 2.0–19.6)

## 2015-04-16 LAB — HEMOGLOBIN A1C
Hgb A1c MFr Bld: 5.7 % — ABNORMAL HIGH (ref <5.7)
Mean Plasma Glucose: 117 mg/dL — ABNORMAL HIGH (ref <117)

## 2015-04-16 LAB — VITAMIN D 25 HYDROXY (VIT D DEFICIENCY, FRACTURES): VIT D 25 HYDROXY: 53 ng/mL (ref 30–100)

## 2015-04-16 NOTE — Progress Notes (Signed)
Patient ID: [name], female   DOB: 03-18-1946, 69 y.o.   MRN: [id number]     This very nice 69 y.o. MWF presents for 3 month follow up with Hypertension, Hyperlipidemia, Pre-Diabetes and Vitamin D Deficiency. Patient has NASH and consequent Cirrhosis with Esophageal varices.      Other problems include COPD with patient fairly sedentary & in a wheel chair for the most part. Currently she's on 3 LPM with O2 sat's in the low 90's and which quickly drop to the low 80's even with minimal activity as walking within the home.      Patient is treated for HTN & BP has been controlled at home. Today's BP: 122/66 mmHg. Patient has had no complaints of any cardiac type chest pain, palpitations, dyspnea/orthopnea/PND, dizziness, claudication, or dependent edema.     Hyperlipidemia is not controlled with diet & meds. Patient denies myalgias or other med SE's. Last Lipids are not at goal -  Cholesterol 229*; HDL 28*; LDL NOT CALC; and very elevated Triglycerides 528.     Also, the patient has history of T2_NIDDM w/Stage 2 CKD and has had no symptoms of reactive hypoglycemia, diabetic polys, paresthesias or visual blurring.  Today's A1c is 5.7%.       Further, the patient also has history of Vitamin D Deficiency and supplements vitamin D without any suspected side-effects. Today's vitamin D is 53.     Medication Sig  . acetaminophen-codeine (TYLENOL #3) 300-30 MG per tablet TAKE ONE-HALF TO ONE TABLET BY MOUTH FOUR TIMES DAILY AS NEEDED  . allopurinol (ZYLOPRIM) 300 MG tablet 1/2-1 tablet daily for gout  . AMITIZA 8 MCG capsule Take 1 capsule (8 mcg total) by mouth 2 (two) times daily with a meal.  . amitriptyline (ELAVIL) 10 MG tablet TAKE 1 TABLET BY MOUTH 3 TIMES A DAY AS NEEDED  . aspirin 81 MG tablet Take 81 mg by mouth daily.  . benzonatate (TESSALON) 100 MG capsule TAKE 1 CAPSULE (100 MG TOTAL) BY MOUTH 3 (THREE) TIMES DAILY  . Cholecalciferol (VITAMIN D-3) 1000 UNITS CAPS Take 5,000 Units by  mouth daily.  . citalopram (CELEXA) 40 MG tablet TAKE 1 TABLET BY MOUTH TWICE A  DAY FOR MOOD  . colchicine 0.6 MG tablet Take 1 tablet (0.6 mg total) by mouth daily.  . cyclobenzaprine (FLEXERIL) 10 MG tablet Take 1 tablet (10 mg total) by mouth 3 (three) times daily as needed for muscle spasms.  . ferrous sulfate 325 (65 FE) MG tablet TAKE 1 TABLET TWICE DAILY (Patient taking differently: TAKE 1 TABLET ONCE DAILY)  . furosemide (LASIX) 80 MG tablet Take 1 tablet (80 mg total) by mouth 2 (two) times daily.  Marland Kitchen gabapentin (NEURONTIN) 300 MG capsule take 1 capsule by mouth in the morning, 1 capsule in the afternoon and 3 capsules at night  . ipratropium (ATROVENT) 0.02 % nebulizer solution use 1 vial in nebulizer EVERY 4 HOURS AS NEEDED   . Magnesium 250 MG TABS Take 250 mg by mouth daily at 6 (six) AM.  . metFORMIN -XR 500 MG 24 hr tablet TAKE 2 TABLETS BY MOUTH TWICE DAILY  . nadolol (CORGARD) 40 MG tablet Take 1 tablet (40 mg total) by mouth daily.  . Omega-3 Fatty Acids (FISH OIL) 1000 MG CAPS Take 1,000 mg by mouth 2 (two) times daily.  Orie Fisherman Veg Laxative Take 1-2 tablets by mouth as needed (constipation).  . OXYGEN Inhale 3 L/min into the lungs continuous   . pantoprazole (  PROTONIX) 40 MG tablet Take 1 tablet (40 mg total) by mouth 2 (two) times daily.  . predniSONE (DELTASONE) 20 MG tablet Take 1/2 tablet to 1 tablet every other day  . spironolactone (ALDACTONE) 25 MG tablet Take 1 tablet (25 mg total) by mouth 2 (two) times daily. For fluids and swelling  . tamoxifen (NOLVADEX) 20 MG tablet Take 1 tablet (20 mg total) by mouth daily.  . pregabalin (LYRICA) 100 MG capsule Take 1 capsule (100 mg total) by mouth 3 (three) times daily.   Allergies  Allergen Reactions  . Atorvastatin   . Diphenhydramine Hcl   . Hydrocodone-Acetaminophen   . Loratadine-Pseudoephedrine Er   . Lorazepam   . Simvastatin   . Sulfonamide Derivatives    PMHx:   Past Medical History  Diagnosis Date  .  Hx of shoulder replacement   . Hypertension   . Hyperlipidemia   . COPD (chronic obstructive pulmonary disease)   . Vitamin D deficiency   . IBS (irritable bowel syndrome)   . Fibromyalgia    Immunization History  Administered Date(s) Administered  . Hep A / Hep B 11/05/2013, 11/12/2013, 12/07/2013, 12/31/2014  . Influenza Split 05/26/2013, 06/17/2014  . Pneumococcal Polysaccharide-23 08/28/2013  . Tdap 11/19/2012   Past Surgical History  Procedure Laterality Date  . Hip arthroplasty Bilateral   . Shoulder surgery Right   . Back surgery    . Cholecystectomy    . Neck surgery    . Esophagogastroduodenoscopy  07/16/2012    Procedure: ESOPHAGOGASTRODUODENOSCOPY (EGD);  Surgeon: Inda Castle, MD;  Location: [facility];  Service: Endoscopy;  Laterality: N/A;  . Gastric varices banding  07/16/2012    Procedure: GASTRIC VARICES BANDING;  Surgeon: Inda Castle, MD;  Location: [facility];  Service: Endoscopy;  Laterality: N/A;   FHx:    Reviewed / unchanged  SHx:    Reviewed / unchanged  Systems Review:  Constitutional: Denies fever, chills, wt changes, headaches, insomnia, fatigue, night sweats, change in appetite. Eyes: Denies redness, blurred vision, diplopia, discharge, itchy, watery eyes.  ENT: Denies discharge, congestion, post nasal drip, epistaxis, sore throat, earache, hearing loss, dental pain, tinnitus, vertigo, sinus pain, snoring.  CV: Denies chest pain, palpitations, irregular heartbeat, syncope, dyspnea, diaphoresis, orthopnea, PND, claudication or edema. Respiratory: denies cough, dyspnea, DOE, pleurisy, hoarseness, laryngitis, wheezing.  Gastrointestinal: Denies dysphagia, odynophagia, heartburn, reflux, water brash, abdominal pain or cramps, nausea, vomiting, bloating, diarrhea, constipation, hematemesis, melena, hematochezia  or hemorrhoids. Genitourinary: Denies dysuria, frequency, urgency, nocturia, hesitancy, discharge, hematuria or flank  pain. Musculoskeletal: Denies arthralgias, myalgias, stiffness, jt. swelling, pain, limping or strain/sprain.  Skin: Denies pruritus, rash, hives, warts, acne, eczema or change in skin lesion(s). Neuro: No weakness, tremor, incoordination, spasms, paresthesia or pain. Psychiatric: Denies confusion, memory loss or sensory loss. Endo: Denies change in weight, skin or hair change.  Heme/Lymph: No excessive bleeding, bruising or enlarged lymph nodes.  Physical Exam  BP 122/66 mmHg  Pulse 64  Temp(Src) 97.5 F (36.4 C)  Resp 16  Ht 5' 2.5" (1.588 m)  Wt 166 lb (75.297 kg)  BMI 29.86 kg/m2  Appears over nourished and in no distress. Eyes: PERRLA, EOMs, conjunctiva no swelling or erythema. Sinuses: No frontal/maxillary tenderness ENT/Mouth: EAC's clear, TM's nl w/o erythema, bulging. Nares clear w/o erythema, swelling, exudates. Oropharynx clear without erythema or exudates. Oral hygiene is good. Tongue normal, non obstructing. Hearing intact.  Neck: Supple. Thyroid nl. Car 2+/2+ without bruits, nodes or JVD. Chest: Respirations distant w/o rales, rhonchi,  wheezing or stridor.  Cor: Heart sounds normal w/ regular rate and rhythm without sig. murmurs, gallops, clicks, or rubs. Peripheral pulses normal and equal  without edema.  Abdomen: Soft & bowel sounds normal. Non-tender w/o guarding, rebound, hernias, masses, or organomegaly.  Lymphatics: Unremarkable.  Musculoskeletal: Full ROM with generalized decrease in muscle power, tone & bulk with patient in a wheelchair (as usual).   Skin: Warm, dry without exposed rashes, lesions or ecchymosis apparent.  Neuro: Cranial nerves intact, reflexes equal bilaterally. Sensory-motor testing grossly intact. Tendon reflexes grossly intact.  Pysch: Alert & oriented x 3.  Insight and judgement nl & appropriate. No ideations.  Assessment and Plan:  1. Essential hypertension  - TSH  2. Hyperlipidemia  - Lipid panel  3. Type 2 diabetes mellitus with  other diabetic kidney complication  - Hemoglobin A1c - Insulin, random  4. Vitamin D deficiency  - Vit D  25 hydroxy   5. Obesity   6. Chronic bronchitis / COPD - EndStage   7. Idiopathic gout  - Uric acid  8. Medication management  - CBC with Differential/Platelet - BASIC METABOLIC PANEL WITH GFR - Hepatic function panel - Magnesium  9. NASH / Cirrhosis w/ esophageal varices   Recommended regular exercise, BP monitoring, weight control, and discussed med and SE's. Recommended labs to assess and monitor clinical status. Further disposition pending results of labs. Over 30 minutes of exam, counseling, chart review was performed

## 2015-04-18 ENCOUNTER — Other Ambulatory Visit: Payer: Self-pay | Admitting: Physician Assistant

## 2015-04-18 MED ORDER — ACETAMINOPHEN-CODEINE #3 300-30 MG PO TABS: ORAL_TABLET | ORAL | Status: DC

## 2015-04-18 NOTE — Progress Notes (Signed)
Rx called in 

## 2015-04-25 ENCOUNTER — Other Ambulatory Visit: Payer: Self-pay | Admitting: Physician Assistant

## 2015-04-25 MED ORDER — IPRATROPIUM BROMIDE 0.02 % IN SOLN: RESPIRATORY_TRACT | Status: DC

## 2015-04-26 ENCOUNTER — Other Ambulatory Visit: Payer: Self-pay | Admitting: Internal Medicine

## 2015-05-04 ENCOUNTER — Ambulatory Visit (INDEPENDENT_AMBULATORY_CARE_PROVIDER_SITE_OTHER): Payer: PPO | Admitting: Physician Assistant

## 2015-05-04 ENCOUNTER — Encounter: Payer: Self-pay | Admitting: Physician Assistant

## 2015-05-04 VITALS — BP 120/70 | HR 79 | Temp 98.2°F | Resp 14 | Ht 62.5 in | Wt 175.0 lb

## 2015-05-04 DIAGNOSIS — N760 Acute vaginitis: Secondary | ICD-10-CM | POA: Diagnosis not present

## 2015-05-04 DIAGNOSIS — R188 Other ascites: Secondary | ICD-10-CM

## 2015-05-04 DIAGNOSIS — R609 Edema, unspecified: Secondary | ICD-10-CM | POA: Diagnosis not present

## 2015-05-04 DIAGNOSIS — R0602 Shortness of breath: Secondary | ICD-10-CM | POA: Diagnosis not present

## 2015-05-04 DIAGNOSIS — K7469 Other cirrhosis of liver: Secondary | ICD-10-CM

## 2015-05-04 LAB — CBC WITH DIFFERENTIAL/PLATELET
BASOS ABS: 0 10*3/uL (ref 0.0–0.1)
BASOS PCT: 1 % (ref 0–1)
Eosinophils Absolute: 0.2 10*3/uL (ref 0.0–0.7)
Eosinophils Relative: 5 % (ref 0–5)
HCT: 36.7 % (ref 36.0–46.0)
HEMOGLOBIN: 11.9 g/dL — AB (ref 12.0–15.0)
LYMPHS ABS: 1.5 10*3/uL (ref 0.7–4.0)
Lymphocytes Relative: 32 % (ref 12–46)
MCH: 30.7 pg (ref 26.0–34.0)
MCHC: 32.4 g/dL (ref 30.0–36.0)
MCV: 94.6 fL (ref 78.0–100.0)
MONOS PCT: 7 % (ref 3–12)
MPV: 11.5 fL (ref 8.6–12.4)
Monocytes Absolute: 0.3 10*3/uL (ref 0.1–1.0)
NEUTROS ABS: 2.6 10*3/uL (ref 1.7–7.7)
NEUTROS PCT: 55 % (ref 43–77)
PLATELETS: 103 10*3/uL — AB (ref 150–400)
RBC: 3.88 MIL/uL (ref 3.87–5.11)
RDW: 15.5 % (ref 11.5–15.5)
WBC: 4.8 10*3/uL (ref 4.0–10.5)

## 2015-05-04 LAB — BASIC METABOLIC PANEL WITH GFR
BUN: 14 mg/dL (ref 7–25)
CHLORIDE: 97 mmol/L — AB (ref 98–110)
CO2: 37 mmol/L — AB (ref 20–31)
CREATININE: 0.85 mg/dL (ref 0.50–0.99)
Calcium: 9.6 mg/dL (ref 8.6–10.4)
GFR, Est African American: 81 mL/min (ref >=60)
GFR, Est Non African American: 71 mL/min (ref >=60)
Glucose, Bld: 83 mg/dL (ref 65–99)
POTASSIUM: 3.7 mmol/L (ref 3.5–5.3)
SODIUM: 141 mmol/L (ref 135–146)

## 2015-05-04 LAB — HEPATIC FUNCTION PANEL
ALK PHOS: 110 U/L (ref 33–130)
ALT: 21 U/L (ref 6–29)
AST: 34 U/L (ref 10–35)
Albumin: 3.8 g/dL (ref 3.6–5.1)
BILIRUBIN DIRECT: 0.2 mg/dL (ref <=0.2)
BILIRUBIN TOTAL: 0.7 mg/dL (ref 0.2–1.2)
Indirect Bilirubin: 0.5 mg/dL (ref 0.2–1.2)
Total Protein: 5.9 g/dL — ABNORMAL LOW (ref 6.1–8.1)

## 2015-05-04 MED ORDER — TERCONAZOLE 0.4 % VA CREA: 1.0000 | TOPICAL_CREAM | Freq: Every day | VAGINAL | Status: DC

## 2015-05-04 MED ORDER — SPIRONOLACTONE 25 MG PO TABS: ORAL_TABLET | ORAL | Status: DC

## 2015-05-04 MED ORDER — FLUCONAZOLE 150 MG PO TABS: 150.0000 mg | ORAL_TABLET | Freq: Once | ORAL | Status: DC

## 2015-05-04 NOTE — Progress Notes (Addendum)
Subjective:    Patient ID: [name], female    DOB: [birth year], 69 y.o.   MRN: [id number]  HPI 69 y.o. WF with history of HTN, DM 2, severe COPD O2 dependent, cirrhosis of liver, edema presents with SOB and edema. She is on lasix 20m BID and she is off spirolactone.  Weight is 10 lbs from the 9th, she has swelling in her left arm and AB abdomen with shortness of breath, worse with lying down, mild cough, no sputum, no PND, peripheral edema.  She also states he has been wearing a pad, has pain with washing/touching, has white discharge, no blood.   Wt Readings from Last 4 Encounters:  05/04/15 175 lb (79.379 kg)  04/15/15 166 lb (75.297 kg)  01/13/15 161 lb (73.029 kg)  11/17/14 160 lb (72.576 kg)    Blood pressure 120/70, pulse 79, temperature 98.2 F (36.8 C), temperature source Temporal, resp. rate 14, height 5' 2.5" (1.588 m), weight 175 lb (79.379 kg), SpO2 91 %.  Current Outpatient Prescriptions on File Prior to Visit  Medication Sig Dispense Refill  . acetaminophen-codeine (TYLENOL #3) 300-30 MG per tablet TAKE ONE-HALF TO ONE TABLET BY MOUTH FOUR TIMES DAILY AS NEEDED 120 tablet 1  . allopurinol (ZYLOPRIM) 300 MG tablet 1/2-1 tablet daily for gout 90 tablet 1  . AMITIZA 8 MCG capsule Take 1 capsule (8 mcg total) by mouth 2 (two) times daily with a meal. 60 capsule 3  . amitriptyline (ELAVIL) 10 MG tablet TAKE 1 TABLET BY MOUTH 3 TIMES A DAY AS NEEDED 90 tablet 2  . aspirin 81 MG tablet Take 81 mg by mouth daily.    . benzonatate (TESSALON) 100 MG capsule TAKE 1 CAPSULE (100 MG TOTAL) BY MOUTH 3 (THREE) TIMES DAILY AS NEEDED FOR COUGH. 100 capsule 1  . Cholecalciferol (VITAMIN D-3) 1000 UNITS CAPS Take 5,000 Units by mouth daily.    . citalopram (CELEXA) 40 MG tablet TAKE 1 TABLET BY MOUTH TWICE A  DAY FOR MOOD 60 tablet 3  . colchicine 0.6 MG tablet Take 1 tablet (0.6 mg total) by mouth daily. 30 tablet 2  . cyclobenzaprine (FLEXERIL) 10 MG tablet Take 1 tablet (10 mg  total) by mouth 3 (three) times daily as needed for muscle spasms. 30 tablet 0  . ferrous sulfate 325 (65 FE) MG tablet TAKE 1 TABLET TWICE DAILY (Patient taking differently: TAKE 1 TABLET ONCE DAILY) 60 tablet 5  . furosemide (LASIX) 80 MG tablet Take 1 tablet (80 mg total) by mouth 2 (two) times daily. 180 tablet 3  . gabapentin (NEURONTIN) 300 MG capsule take 1 capsule by mouth in the morning, 1 capsule in the afternoon and 3 capsules at night  2  . ipratropium (ATROVENT) 0.02 % nebulizer solution use 1 vial in nebulizer EVERY 4 HOURS AS NEEDED FOR WHEEZING OR SHORTNESS OF BREATH 75 mL 2  . Magnesium 250 MG TABS Take 250 mg by mouth daily at 6 (six) AM.    . metFORMIN (GLUCOPHAGE-XR) 500 MG 24 hr tablet TAKE 2 TABLETS BY MOUTH TWICE DAILY AFTER MEALS 360 tablet 3  . nadolol (CORGARD) 40 MG tablet Take 1 tablet (40 mg total) by mouth daily. 90 tablet 1  . Omega-3 Fatty Acids (FISH OIL) 1000 MG CAPS Take 1,000 mg by mouth 2 (two) times daily.    .Marland KitchenOVER THE COUNTER MEDICATION Take 1-2 tablets by mouth as needed (constipation). Natural Veg Laxative    . OXYGEN Inhale 3 L/min  into the lungs continuous as needed. On exertion and at home    . pantoprazole (PROTONIX) 40 MG tablet Take 1 tablet (40 mg total) by mouth 2 (two) times daily. 180 tablet 1  . predniSONE (DELTASONE) 20 MG tablet Take 1/2 tablet to 1 tablet every other day until next office visit. 30 tablet 0  . tamoxifen (NOLVADEX) 20 MG tablet Take 1 tablet (20 mg total) by mouth daily. 90 tablet 2   No current facility-administered medications on file prior to visit.    Past Medical History  Diagnosis Date  . Hx of shoulder replacement   . Hypertension   . Hyperlipidemia   . COPD (chronic obstructive pulmonary disease)   . Vitamin D deficiency   . IBS (irritable bowel syndrome)   . Fibromyalgia    Review of Systems  Constitutional: Positive for fatigue and unexpected weight change. Negative for fever, chills, diaphoresis, activity  change and appetite change (good appetite, no nausea).  HENT: Negative.   Respiratory: Positive for cough and shortness of breath. Negative for apnea, choking, chest tightness, wheezing and stridor.        + orthopnea and PND  Cardiovascular: Negative for chest pain, palpitations and leg swelling.  Gastrointestinal: Positive for abdominal pain (RUQ and epigastric), constipation and abdominal distention. Negative for nausea, vomiting, diarrhea, blood in stool, anal bleeding and rectal pain.  Genitourinary: Positive for vaginal discharge and vaginal pain. Negative for dysuria, urgency, frequency, hematuria, flank pain, decreased urine volume, vaginal bleeding, enuresis, difficulty urinating, genital sores, menstrual problem, pelvic pain and dyspareunia.  Musculoskeletal: Positive for myalgias, back pain, arthralgias and gait problem. Negative for joint swelling, neck pain and neck stiffness.       Right hypoplastric arm with edema, no erythema, warmth, coldness, pain.   Skin: Negative.        Objective:   Physical Exam General appearance: alert, no distress, WD/WN,  female HEENT: normocephalic, sclerae anicteric, TMs pearly, nares patent, no discharge or erythema, pharynx normal, decreased hearing does not have hearing aids in at this time, O2 in place Oral cavity: MMM, no lesions Neck: supple, small unchanged tender mobile left anterior lymphadenopathy, no thyromegaly Heart: RRR, normal S1, S2, no murmurs Lungs:decrease breath sounds diffuse without rales, rhonchi, wheezing.   Abdomen: decreased bs, distended with fluid shift, tender right upper quadrant with hepatomegaly,  no masses,  no splenomegaly Musculoskeletal:  Hypoplastric right arm with mild edema, good distal pulses, cap refill, no engorged veins, no warmth, erythema, tenderness.   no obvious deformity Extremities:No edema in bilateral feet. no cyanosis, no clubbing Neurological: alert, oriented x 3, CN2-12 intact, strength normal  upper extremities and lower extremities, sensation decreased to shins, DTRs 2+ throughout, no cerebellar signs, in a wheel chair Psychiatric: normal affect, behavior normal, pleasant  VULVA: vulvar tenderness , vulvar erythema , VAGINA: vaginal tenderness , vaginal edema , vaginal erythema , vaginal discharge - white, copious and creamy.     Assessment & Plan:  Weight gain 10 lbs, with SOB and AB distention lungs are CTAB and O2 on 3 L at 97% + ascites and only mild peripheral edema- will get labs, check  INR/PTT, increase fluids pill lasix 64m BID and add back spirolactone 221mTID but may need parasentesis, will check AB US[place] CXR  Vaginitis and vulvovaginitis - Plan: terconazole (TERAZOL 7) 0.4 % vaginal cream, fluconazole (DIFLUCAN) 150 MG tablet  Right arm swelling  good pulses, likely due to fluid overload, if not better with decrease weight  then will get [place]  Told patient and husband if any worsening SOB, AB pain, confusion, needs to go to ER Close follow up 1 week.

## 2015-05-04 NOTE — Patient Instructions (Addendum)
Increase lasix to 42m twice a day, add on spirolactone 275m3 times a day for 3-4 days.  Follow up in the office 1 week  Call if you get dizzy  Go to the ER if any CP, SOB, nausea, dizziness, severe HA, changes vision/speech  Ascites Ascites is a gathering of fluid in the belly (abdomen). This is most often caused by liver disease. It may also be caused by a number of other less common problems. It causes a ballooning out (distension) of the abdomen. CAUSES  Scarring of the liver (cirrhosis) is the most common cause of ascites. Other causes include:  Infection or inflammation in the abdomen.  Cancer in the abdomen.  Heart failure.  Certain forms of kidney failure (nephritic syndrome).  Inflammation of the pancreas.  Clots in the veins of the liver. SYMPTOMS  In the early stages of ascites, you may not have any symptoms. The main symptom of ascites is a sense of abdominal bloating. This is due to the presence of fluid. This may also cause an increase in abdominal or waist size. People with this condition can develop swelling in the legs, and men can develop a swollen scrotum. When there is a lot of fluid, it may be hard to breath. Stretching of the abdomen by fluid can be painful. DIAGNOSIS  Certain features of your medical history, such as a history of liver disease and of an enlarging abdomen, can suggest the presence of ascites. The diagnosis of ascites can be made on physical exam by your caregiver. An abdominal ultrasound examination can confirm that ascites is present, and estimate the amount of fluid. Once ascites is confirmed, it is important to determine its cause. Again, a history of one of the conditions listed in "CAUSES" provides a strong clue. A physical exam is important, and blood and X-ray tests may be needed. During a procedure called paracentesis, a sample of fluid is removed from the abdomen. This can determine certain key features about the fluid, such as whether or  not infection or cancer is present. Your caregiver will determine if a paracentesis is necessary. They will describe the procedure to you. PREVENTION  Ascites is a complication of other conditions. Therefore to prevent ascites, you must seek treatment for any significant health conditions you have. Once ascites is present, careful attention to fluid and salt intake may help prevent it from getting worse. If you have ascites, you should not drink alcohol. PROGNOSIS  The prognosis of ascites depends on the underlying disease. If the disease is reversible, such as with certain infections or with heart failure, then ascites may improve or disappear. When ascites is caused by cirrhosis, then it indicates that the liver disease has worsened, and further evaluation and treatment of the liver disease is needed. If your ascites is caused by cancer, then the success or failure of the cancer treatment will determine whether your ascites will improve or worsen. RISKS AND COMPLICATIONS  Ascites is likely to worsen if it is not properly diagnosed and treated. A large amount of ascites can cause pain and difficulty breathing. The main complication, besides worsening, is infection (called spontaneous bacterial peritonitis). This requires prompt treatment. TREATMENT  The treatment of ascites depends on its cause. When liver disease is your cause, medical management using water pills (diuretics) and decreasing salt intake is often effective. Ascites due to peritoneal inflammation or malignancy (cancer) alone does not respond to salt restriction and diuretics. Hospitalization is sometimes required. If the treatment of ascites  cannot be managed with medications, a number of other treatments are available. Your caregivers will help you decide which will work best for you. Some of these are:  Removal of fluid from the abdomen (paracentesis).  Fluid from the abdomen is passed into a vein (peritoneovenous shunting).  Liver  transplantation.  Transjugular intrahepatic portosystemic stent shunt. HOME CARE INSTRUCTIONS  It is important to monitor body weight and the intake and output of fluids. Weigh yourself at the same time every day. Record your weights. Fluid restriction may be necessary. It is also important to know your salt intake. The more salt you take in, the more fluid you will retain. Ninety percent of people with ascites respond to this approach.  Follow any directions for medicines carefully.  Follow up with your caregiver, as directed.  Report any changes in your health, especially any new or worsening symptoms.  If your ascites is from liver disease, avoid alcohol and other substances toxic to the liver. SEEK MEDICAL CARE IF:   Your weight increases more than a few pounds in a few days.  Your abdominal or waist size increases.  You develop swelling in your legs.  You had swelling and it worsens. SEEK IMMEDIATE MEDICAL CARE IF:   You develop a fever.  You develop new abdominal pain.  You develop difficulty breathing.  You develop confusion.  You have bleeding from the mouth, stomach, or rectum. MAKE SURE YOU:   Understand these instructions.  Will watch your condition.  Will get help right away if you are not doing well or get worse. Document Released: 07/23/2005 Document Revised: 10/15/2011 Document Reviewed: 02/21/2007 [facility] Patient Information 2015 [place], [place]. This information is not intended to replace advice given to you by your health care provider. Make sure you discuss any questions you have with your health care provider.

## 2015-05-09 ENCOUNTER — Ambulatory Visit (HOSPITAL_COMMUNITY)
Admission: RE | Admit: 2015-05-09 | Discharge: 2015-05-09 | Disposition: A | Discharge status: Home / Self Care | Payer: PPO | Source: Ambulatory Visit | Attending: Physician Assistant | Admitting: Physician Assistant

## 2015-05-09 DIAGNOSIS — R0602 Shortness of breath: Secondary | ICD-10-CM | POA: Diagnosis not present

## 2015-05-09 DIAGNOSIS — R079 Chest pain, unspecified: Secondary | ICD-10-CM | POA: Insufficient documentation

## 2015-05-09 DIAGNOSIS — R0902 Hypoxemia: Secondary | ICD-10-CM | POA: Diagnosis not present

## 2015-05-09 DIAGNOSIS — R188 Other ascites: Secondary | ICD-10-CM

## 2015-05-09 DIAGNOSIS — K7460 Unspecified cirrhosis of liver: Secondary | ICD-10-CM | POA: Insufficient documentation

## 2015-05-09 DIAGNOSIS — Z9049 Acquired absence of other specified parts of digestive tract: Secondary | ICD-10-CM | POA: Insufficient documentation

## 2015-05-11 ENCOUNTER — Encounter: Payer: Self-pay | Admitting: Physician Assistant

## 2015-05-11 ENCOUNTER — Other Ambulatory Visit: Payer: PPO

## 2015-05-11 ENCOUNTER — Other Ambulatory Visit: Payer: Self-pay | Admitting: *Deleted

## 2015-05-11 ENCOUNTER — Ambulatory Visit (INDEPENDENT_AMBULATORY_CARE_PROVIDER_SITE_OTHER): Payer: PPO | Admitting: Physician Assistant

## 2015-05-11 VITALS — BP 110/60 | HR 77 | Temp 97.7°F | Resp 14 | Ht 62.0 in | Wt 172.0 lb

## 2015-05-11 DIAGNOSIS — K7689 Other specified diseases of liver: Secondary | ICD-10-CM

## 2015-05-11 DIAGNOSIS — K7469 Other cirrhosis of liver: Secondary | ICD-10-CM

## 2015-05-11 DIAGNOSIS — R109 Unspecified abdominal pain: Secondary | ICD-10-CM

## 2015-05-11 DIAGNOSIS — R0602 Shortness of breath: Secondary | ICD-10-CM | POA: Diagnosis not present

## 2015-05-11 DIAGNOSIS — R1011 Right upper quadrant pain: Secondary | ICD-10-CM

## 2015-05-11 LAB — HEPATIC FUNCTION PANEL
ALBUMIN: 3.8 g/dL (ref 3.6–5.1)
ALK PHOS: 121 U/L (ref 33–130)
ALT: 22 U/L (ref 6–29)
AST: 29 U/L (ref 10–35)
BILIRUBIN INDIRECT: 0.5 mg/dL (ref 0.2–1.2)
BILIRUBIN TOTAL: 0.6 mg/dL (ref 0.2–1.2)
Bilirubin, Direct: 0.1 mg/dL (ref <=0.2)
TOTAL PROTEIN: 5.6 g/dL — AB (ref 6.1–8.1)

## 2015-05-11 LAB — BASIC METABOLIC PANEL WITH GFR
BUN: 14 mg/dL (ref 7–25)
CALCIUM: 8.8 mg/dL (ref 8.6–10.4)
CO2: 30 mmol/L (ref 20–31)
Chloride: 99 mmol/L (ref 98–110)
Creat: 1.09 mg/dL — ABNORMAL HIGH (ref 0.50–0.99)
GFR, EST AFRICAN AMERICAN: 60 mL/min (ref >=60)
GFR, EST NON AFRICAN AMERICAN: 52 mL/min — AB (ref >=60)
Glucose, Bld: 194 mg/dL — ABNORMAL HIGH (ref 65–99)
POTASSIUM: 3.6 mmol/L (ref 3.5–5.3)
SODIUM: 137 mmol/L (ref 135–146)

## 2015-05-11 MED ORDER — PREDNISONE 20 MG PO TABS: ORAL_TABLET | ORAL | Status: DC

## 2015-05-11 NOTE — Progress Notes (Signed)
Subjective:    Patient ID: [name], female    DOB: 12/09/1945, 69 y.o.   MRN: [id number]  HPI 69 y.o. WF with history of HTN, DM 2, severe COPD O2 dependent, cirrhosis of liver, edema presents for follow up of SOB/edema. Last visit she had a 10 lb weight gain from a month ago, her AB was swollen, her lasix was increased from 13m BID to 865mBID and her spirolactone was added back on 2535mID.  She is on lasix 65m64mD and she is off spirolactone. Today her weight is only down 3 lbs. Her CXR showed no pulmonary edema, and AB US d[place]orea not show ascites but did show new ill defined 2 cm hypoechoic area along gallbladder fossa and suggested MRI.   She states that she has sinus congestion, green mucus, her breathing is better than before with 6 lbs down. Still having right flank/pain, intermittent, will occ have radiation around to her back, will have nausea occ with it, nothing worse/nothing better  She has left inguinal leg pain, will get follow up Dr. AlluElmyra RicksLast visit she was also treated for vaginitis, she used ointment and sitz baths, and this has cleared up.    AB US 1[place]03/2016 IMPRESSION: Prior cholecystectomy. No biliary dilatation.  Heterogeneous nodular appearing liver compatible with hepatic cirrhosis. Diffuse increased liver echogenicity suggesting a component of fatty infiltration/hepatic steatosis as well. Ill-defined 2 cm hypoechoic area along the gallbladder fossa remains indeterminate but suggests fatty infiltration. This is a new finding. If there is concern for an underlying hepatic lesion. Recommend further evaluation with nonemergent abdominal MRI without and with contrast.  Mild chronic appearing right hydronephrosis versus UPJ obstruction.  Wt Readings from Last 4 Encounters:  05/11/15 172 lb (78.019 kg)  05/04/15 175 lb (79.379 kg)  04/15/15 166 lb (75.297 kg)  01/13/15 161 lb (73.029 kg)    Blood pressure 110/60, pulse 77, temperature 97.7 F  (36.5 C), temperature source Temporal, resp. rate 14, height 5' 2"  (1.575 m), weight 172 lb (78.019 kg), SpO2 92 %.  Body mass index is 31.45 kg/(m^2).   Current Outpatient Prescriptions on File Prior to Visit  Medication Sig Dispense Refill  . acetaminophen-codeine (TYLENOL #3) 300-30 MG per tablet TAKE ONE-HALF TO ONE TABLET BY MOUTH FOUR TIMES DAILY AS NEEDED 120 tablet 1  . allopurinol (ZYLOPRIM) 300 MG tablet 1/2-1 tablet daily for gout 90 tablet 1  . amitriptyline (ELAVIL) 10 MG tablet TAKE 1 TABLET BY MOUTH 3 TIMES A DAY AS NEEDED 90 tablet 2  . aspirin 81 MG tablet Take 81 mg by mouth daily.    . benzonatate (TESSALON) 100 MG capsule TAKE 1 CAPSULE (100 MG TOTAL) BY MOUTH 3 (THREE) TIMES DAILY AS NEEDED FOR COUGH. 100 capsule 1  . Cholecalciferol (VITAMIN D-3) 1000 UNITS CAPS Take 5,000 Units by mouth daily.    . citalopram (CELEXA) 40 MG tablet TAKE 1 TABLET BY MOUTH TWICE A  DAY FOR MOOD 60 tablet 3  . colchicine 0.6 MG tablet Take 1 tablet (0.6 mg total) by mouth daily. 30 tablet 2  . cyclobenzaprine (FLEXERIL) 10 MG tablet Take 1 tablet (10 mg total) by mouth 3 (three) times daily as needed for muscle spasms. 30 tablet 0  . ferrous sulfate 325 (65 FE) MG tablet TAKE 1 TABLET TWICE DAILY (Patient taking differently: TAKE 1 TABLET ONCE DAILY) 60 tablet 5  . fluconazole (DIFLUCAN) 150 MG tablet Take 1 tablet (150 mg total) by mouth  once. 1 tablet 3  . furosemide (LASIX) 80 MG tablet Take 1 tablet (80 mg total) by mouth 2 (two) times daily. 180 tablet 3  . gabapentin (NEURONTIN) 300 MG capsule take 1 capsule by mouth in the morning, 1 capsule in the afternoon and 3 capsules at night  2  . ipratropium (ATROVENT) 0.02 % nebulizer solution use 1 vial in nebulizer EVERY 4 HOURS AS NEEDED FOR WHEEZING OR SHORTNESS OF BREATH 75 mL 2  . Magnesium 250 MG TABS Take 250 mg by mouth daily at 6 (six) AM.    . metFORMIN (GLUCOPHAGE-XR) 500 MG 24 hr tablet TAKE 2 TABLETS BY MOUTH TWICE DAILY AFTER  MEALS 360 tablet 3  . nadolol (CORGARD) 40 MG tablet Take 1 tablet (40 mg total) by mouth daily. 90 tablet 1  . Omega-3 Fatty Acids (FISH OIL) 1000 MG CAPS Take 1,000 mg by mouth 2 (two) times daily.    Marland Kitchen OVER THE COUNTER MEDICATION Take 1-2 tablets by mouth as needed (constipation). Natural Veg Laxative    . OXYGEN Inhale 3 L/min into the lungs continuous as needed. On exertion and at home    . pantoprazole (PROTONIX) 40 MG tablet Take 1 tablet (40 mg total) by mouth 2 (two) times daily. 180 tablet 1  . predniSONE (DELTASONE) 20 MG tablet Take 1/2 tablet to 1 tablet every other day until next office visit. 30 tablet 0  . spironolactone (ALDACTONE) 25 MG tablet Take 1 tablet (25 mg total) by mouth 3 times a day. For fluids and swelling 90 tablet 1  . tamoxifen (NOLVADEX) 20 MG tablet Take 1 tablet (20 mg total) by mouth daily. 90 tablet 2  . terconazole (TERAZOL 7) 0.4 % vaginal cream Place 1 applicator vaginally at bedtime. 45 g 2   No current facility-administered medications on file prior to visit.    Past Medical History  Diagnosis Date  . Hx of shoulder replacement   . Hypertension   . Hyperlipidemia   . COPD (chronic obstructive pulmonary disease) ([place])   . Vitamin D deficiency   . IBS (irritable bowel syndrome)   . Fibromyalgia    Review of Systems  Constitutional: Positive for fatigue. Negative for fever, chills, diaphoresis, activity change, appetite change (good appetite) and unexpected weight change.  HENT: Negative.   Respiratory: Positive for shortness of breath (improved). Negative for apnea, cough, choking, chest tightness, wheezing and stridor.        + orthopnea and PND  Cardiovascular: Negative for chest pain, palpitations and leg swelling.  Gastrointestinal: Positive for nausea, abdominal pain (RUQ/flank), constipation (BM yesterday, no blood/not black) and abdominal distention. Negative for vomiting, diarrhea, blood in stool, anal bleeding and rectal pain.   Genitourinary: Negative for dysuria, urgency, frequency, hematuria, flank pain, decreased urine volume, vaginal bleeding, vaginal discharge, enuresis, difficulty urinating, genital sores, vaginal pain, menstrual problem, pelvic pain and dyspareunia.  Musculoskeletal: Positive for myalgias, back pain, arthralgias and gait problem. Negative for joint swelling, neck pain and neck stiffness.       Right hypoplastric arm with edema, no erythema, warmth, coldness, pain.   Skin: Negative.   Neurological: Negative.   Psychiatric/Behavioral: Negative for confusion.       Objective:   Physical Exam General appearance: alert, no distress, WD/WN,  female HEENT: normocephalic, sclerae anicteric, TMs pearly, nares patent, no discharge or erythema, pharynx normal, decreased hearing does not have hearing aids in at this time, O2 in place Oral cavity: MMM, no lesions Neck: supple, small  unchanged tender mobile left anterior lymphadenopathy, no thyromegaly Heart: RRR, normal S1, S2, no murmurs Lungs:decrease breath sounds diffuse without rales, rhonchi, wheezing.   Abdomen: decreased bs, distended, tender right upper quadrant, no CVA tenderness no masses,  no splenomegaly Musculoskeletal:  Hypoplastric right arm without edema, good distal pulses, cap refill,  no warmth, erythema, tenderness.   Extremities:No edema in bilateral feet. no cyanosis, no clubbing Neurological: alert, oriented x 3, CN2-12 intact, strength normal upper extremities and lower extremities, sensation decreased to shins, DTRs 2+ throughout, no cerebellar signs, in a wheel chair Psychiatric: normal affect, behavior normal, pleasant       Assessment & Plan:  SOB-  Improved with weight down 6 lbs, continue lasix and spirlactone for now Check CBC BMP, may need to cut back on lasix.  lungs are CTAB and O2 on 3 L at 97%  RUQ/flank pain US shows new foci on liver, will get MRI  ? Possible kidney stone, check labs  Left leg/back  pain Follow up Dr. Elmyra Russell  Told patient and husband if any worsening SOB, AB pain, confusion, needs to go to ER

## 2015-05-12 LAB — CBC WITH DIFFERENTIAL/PLATELET
BASOS ABS: 0 10*3/uL (ref 0.0–0.1)
BASOS PCT: 1 % (ref 0–1)
EOS ABS: 0.2 10*3/uL (ref 0.0–0.7)
EOS PCT: 4 % (ref 0–5)
HCT: 38.7 % (ref 36.0–46.0)
Hemoglobin: 12.3 g/dL (ref 12.0–15.0)
LYMPHS PCT: 32 % (ref 12–46)
Lymphs Abs: 1.4 10*3/uL (ref 0.7–4.0)
MCH: 30.4 pg (ref 26.0–34.0)
MCHC: 31.8 g/dL (ref 30.0–36.0)
MCV: 95.8 fL (ref 78.0–100.0)
MONO ABS: 0.3 10*3/uL (ref 0.1–1.0)
MPV: 11.9 fL (ref 8.6–12.4)
Monocytes Relative: 6 % (ref 3–12)
Neutro Abs: 2.6 10*3/uL (ref 1.7–7.7)
Neutrophils Relative %: 57 % (ref 43–77)
PLATELETS: 84 10*3/uL — AB (ref 150–400)
RBC: 4.04 MIL/uL (ref 3.87–5.11)
RDW: 15.2 % (ref 11.5–15.5)
WBC: 4.5 10*3/uL (ref 4.0–10.5)

## 2015-05-12 LAB — URINALYSIS, MICROSCOPIC ONLY
Casts: NONE SEEN [LPF]
Crystals: NONE SEEN [HPF]
RBC / HPF: NONE SEEN RBC/HPF (ref <=2)
YEAST: NONE SEEN [HPF]

## 2015-05-12 LAB — URINALYSIS, ROUTINE W REFLEX MICROSCOPIC
Bilirubin Urine: NEGATIVE
GLUCOSE, UA: NEGATIVE
HGB URINE DIPSTICK: NEGATIVE
Ketones, ur: NEGATIVE
NITRITE: NEGATIVE
PH: 6 (ref 5.0–8.0)
PROTEIN: NEGATIVE
Specific Gravity, Urine: 1.008 (ref 1.001–1.035)

## 2015-05-13 LAB — URINE CULTURE

## 2015-05-14 MED ORDER — CEFUROXIME AXETIL 250 MG PO TABS: 250.0000 mg | ORAL_TABLET | Freq: Two times a day (BID) | ORAL | Status: DC

## 2015-05-14 NOTE — Addendum Note (Signed)
Addended by: Vicie Mutters R on: 05/14/2015 02:54 PM   Modules accepted: Orders

## 2015-05-16 ENCOUNTER — Encounter: Payer: Self-pay | Admitting: Internal Medicine

## 2015-05-16 ENCOUNTER — Ambulatory Visit (INDEPENDENT_AMBULATORY_CARE_PROVIDER_SITE_OTHER): Payer: PPO | Admitting: Internal Medicine

## 2015-05-16 VITALS — BP 140/64 | HR 76 | Temp 98.2°F | Resp 16 | Ht 62.0 in

## 2015-05-16 DIAGNOSIS — Z7409 Other reduced mobility: Secondary | ICD-10-CM | POA: Diagnosis not present

## 2015-05-16 DIAGNOSIS — R21 Rash and other nonspecific skin eruption: Secondary | ICD-10-CM

## 2015-05-16 DIAGNOSIS — Z9981 Dependence on supplemental oxygen: Secondary | ICD-10-CM | POA: Diagnosis not present

## 2015-05-16 MED ORDER — TRIAMCINOLONE ACETONIDE 0.1 % EX CREA: 1.0000 "application " | TOPICAL_CREAM | Freq: Three times a day (TID) | CUTANEOUS | Status: DC

## 2015-05-16 NOTE — Progress Notes (Signed)
   Subjective:    Patient ID: [name], female    DOB: 1946-07-09, 69 y.o.   MRN: [id number]  HPI  Patient presents to the office for face to face evaluation of for need for a powered wheelchair.  She reports that she needs a powered wheelchair because she cannot safely do her ADLs without falling.  She also gets very hypoxic and short of breath despite using her oxygen 24/7.  She has had a history of multiple falls at home.    Patient also reports that she has had a rash for the past couple days.  She reports that it broke out after they increased her fluid pills.  She reports that they did use some hydrocortisone on it which helped but it hasn't taken care of it completely.  No changes in soaps, lotions, detergents. She has not worn any new clothes.    Review of Systems  Constitutional: Negative for fever, chills and fatigue.  Cardiovascular: Positive for leg swelling. Negative for chest pain and palpitations.  Gastrointestinal: Negative for nausea and vomiting.  Skin: Positive for rash.       Objective:   Physical Exam  Constitutional: She is oriented to person, place, and time. She appears well-developed and well-nourished. No distress.  HENT:  Head: Normocephalic.  Mouth/Throat: No oropharyngeal exudate.  Eyes: Conjunctivae are normal. No scleral icterus.  Neck: Normal range of motion. Neck supple. No JVD present. No thyromegaly present.  Cardiovascular: Normal rate, regular rhythm and intact distal pulses.  Exam reveals no gallop and no friction rub.   No murmur heard. Pulmonary/Chest: Effort normal and breath sounds normal. No respiratory distress. She has no wheezes. She has no rales. She exhibits no tenderness.  On pulse O2 in [place]  Abdominal: Soft. Bowel sounds are normal.  Musculoskeletal: Normal range of motion.  Lymphadenopathy:    She has no cervical adenopathy.  Neurological: She is alert and oriented to person, place, and time.  Skin: Skin is warm and dry. Rash  noted. Rash is maculopapular. She is not diaphoretic.  Flat erythematous maculopapular rash along both interior forearms  Psychiatric: She has a normal mood and affect. Her behavior is normal. Judgment and thought content normal.  Nursing note and vitals reviewed.   Filed Vitals:   05/16/15 1521  BP: 140/64  Pulse: 76  Temp: 98.2 F (36.8 C)  Resp: 16         Assessment & Plan:    1. Impaired functional mobility, balance, and endurance -given prescription for electric scooter -encouraged walking and strengthening activities at home  2. Dependence on supplemental oxygen -cont O2 -breathing good today  3. Rash and nonspecific skin eruption -kenalog -if no relief by Friday increase prednisone temporarily -likely from increase in diuretics.

## 2015-05-23 ENCOUNTER — Other Ambulatory Visit: Payer: Self-pay | Admitting: *Deleted

## 2015-05-23 MED ORDER — CITALOPRAM HYDROBROMIDE 40 MG PO TABS: ORAL_TABLET | ORAL | Status: DC

## 2015-05-25 ENCOUNTER — Other Ambulatory Visit: Payer: Self-pay | Admitting: Physician Assistant

## 2015-05-25 ENCOUNTER — Ambulatory Visit
Admission: RE | Admit: 2015-05-25 | Discharge: 2015-05-25 | Disposition: A | Discharge status: Home / Self Care | Payer: PPO | Source: Ambulatory Visit | Attending: Physician Assistant | Admitting: Physician Assistant

## 2015-05-25 DIAGNOSIS — K7689 Other specified diseases of liver: Secondary | ICD-10-CM

## 2015-05-25 DIAGNOSIS — R1011 Right upper quadrant pain: Secondary | ICD-10-CM

## 2015-05-25 DIAGNOSIS — K7469 Other cirrhosis of liver: Secondary | ICD-10-CM

## 2015-05-25 MED ORDER — GADOXETATE DISODIUM 0.25 MMOL/ML IV SOLN
8.0000 mL | Freq: Once | INTRAVENOUS | Status: AC | PRN
  Administered 2015-05-25: 8 mL via INTRAVENOUS

## 2015-05-26 ENCOUNTER — Other Ambulatory Visit: Payer: Self-pay | Admitting: *Deleted

## 2015-05-26 ENCOUNTER — Telehealth: Payer: Self-pay | Admitting: Internal Medicine

## 2015-05-26 ENCOUNTER — Other Ambulatory Visit: Payer: Self-pay | Admitting: Internal Medicine

## 2015-05-26 MED ORDER — ACETAMINOPHEN-CODEINE #3 300-30 MG PO TABS: ORAL_TABLET | ORAL | Status: DC

## 2015-05-26 NOTE — Telephone Encounter (Signed)
Patient calls stating that her rash has not improved using kenalog cream.  Will temporarily increase prednisone.  If no relief will bring back into the office.

## 2015-05-26 NOTE — Telephone Encounter (Signed)
Patient and husband aware of recommendations and Prednisone dosage.  Will RTC if no relief from symptoms.

## 2015-06-14 ENCOUNTER — Ambulatory Visit: Payer: PPO

## 2015-06-14 DIAGNOSIS — R899 Unspecified abnormal finding in specimens from other organs, systems and tissues: Secondary | ICD-10-CM

## 2015-06-14 DIAGNOSIS — N390 Urinary tract infection, site not specified: Secondary | ICD-10-CM

## 2015-06-15 ENCOUNTER — Other Ambulatory Visit: Payer: Self-pay | Admitting: Physician Assistant

## 2015-06-15 ENCOUNTER — Ambulatory Visit (INDEPENDENT_AMBULATORY_CARE_PROVIDER_SITE_OTHER): Payer: PPO | Admitting: *Deleted

## 2015-06-15 DIAGNOSIS — Z23 Encounter for immunization: Secondary | ICD-10-CM

## 2015-06-15 LAB — URINALYSIS, ROUTINE W REFLEX MICROSCOPIC
Bilirubin Urine: NEGATIVE
Glucose, UA: NEGATIVE
HGB URINE DIPSTICK: NEGATIVE
KETONES UR: NEGATIVE
NITRITE: NEGATIVE
PH: 6 (ref 5.0–8.0)
PROTEIN: NEGATIVE
Specific Gravity, Urine: 1.01 (ref 1.001–1.035)

## 2015-06-15 LAB — URINE CULTURE: Colony Count: >=100000

## 2015-06-15 LAB — URINALYSIS, MICROSCOPIC ONLY
CASTS: NONE SEEN [LPF]
RBC / HPF: NONE SEEN RBC/HPF (ref <=2)
Yeast: NONE SEEN [HPF]

## 2015-06-15 LAB — BASIC METABOLIC PANEL WITH GFR
BUN: 14 mg/dL (ref 7–25)
CHLORIDE: 97 mmol/L — AB (ref 98–110)
CO2: 26 mmol/L (ref 20–31)
Calcium: 9.3 mg/dL (ref 8.6–10.4)
Creat: 0.96 mg/dL (ref 0.50–0.99)
GFR, EST NON AFRICAN AMERICAN: 61 mL/min (ref >=60)
GFR, Est African American: 70 mL/min (ref >=60)
Glucose, Bld: 137 mg/dL — ABNORMAL HIGH (ref 65–99)
POTASSIUM: 3.8 mmol/L (ref 3.5–5.3)
SODIUM: 139 mmol/L (ref 135–146)

## 2015-06-15 MED ORDER — IPRATROPIUM BROMIDE 0.02 % IN SOLN: RESPIRATORY_TRACT | Status: DC

## 2015-06-15 NOTE — Progress Notes (Signed)
Rx called into [facility].

## 2015-06-17 ENCOUNTER — Other Ambulatory Visit (HOSPITAL_COMMUNITY): Payer: Self-pay | Admitting: Orthopedic Surgery

## 2015-06-17 ENCOUNTER — Ambulatory Visit (HOSPITAL_COMMUNITY)
Admission: RE | Admit: 2015-06-17 | Discharge: 2015-06-17 | Disposition: A | Discharge status: Home / Self Care | Payer: PPO | Source: Ambulatory Visit | Attending: Cardiology | Admitting: Cardiology

## 2015-06-17 DIAGNOSIS — M7989 Other specified soft tissue disorders: Secondary | ICD-10-CM | POA: Diagnosis not present

## 2015-06-17 DIAGNOSIS — E785 Hyperlipidemia, unspecified: Secondary | ICD-10-CM | POA: Insufficient documentation

## 2015-06-17 DIAGNOSIS — I10 Essential (primary) hypertension: Secondary | ICD-10-CM | POA: Insufficient documentation

## 2015-06-20 ENCOUNTER — Other Ambulatory Visit: Payer: PPO

## 2015-06-20 ENCOUNTER — Other Ambulatory Visit (HOSPITAL_COMMUNITY): Payer: Self-pay | Admitting: Orthopedic Surgery

## 2015-06-20 ENCOUNTER — Other Ambulatory Visit: Payer: Self-pay | Admitting: Internal Medicine

## 2015-06-20 DIAGNOSIS — R8290 Unspecified abnormal findings in urine: Secondary | ICD-10-CM

## 2015-06-20 DIAGNOSIS — M25552 Pain in left hip: Secondary | ICD-10-CM

## 2015-06-21 LAB — URINALYSIS, MICROSCOPIC ONLY
CRYSTALS: NONE SEEN [HPF]
Casts: NONE SEEN [LPF]
RBC / HPF: NONE SEEN RBC/HPF (ref <=2)
Yeast: NONE SEEN [HPF]

## 2015-06-21 LAB — URINALYSIS, ROUTINE W REFLEX MICROSCOPIC
BILIRUBIN URINE: NEGATIVE
Glucose, UA: NEGATIVE
Hgb urine dipstick: NEGATIVE
Ketones, ur: NEGATIVE
NITRITE: POSITIVE — AB
Protein, ur: NEGATIVE
SPECIFIC GRAVITY, URINE: 1.01 (ref 1.001–1.035)
pH: 6.5 (ref 5.0–8.0)

## 2015-06-22 LAB — URINE CULTURE

## 2015-06-22 MED ORDER — CEFUROXIME AXETIL 250 MG PO TABS: 250.0000 mg | ORAL_TABLET | Freq: Two times a day (BID) | ORAL | Status: DC

## 2015-06-22 NOTE — Addendum Note (Signed)
Addended by: Vicie Mutters R on: 06/22/2015 01:12 PM   Modules accepted: Orders

## 2015-06-23 ENCOUNTER — Encounter (HOSPITAL_COMMUNITY): Admission: RE | Admit: 2015-06-23 | Payer: PPO | Source: Ambulatory Visit

## 2015-06-23 ENCOUNTER — Telehealth: Payer: Self-pay | Admitting: *Deleted

## 2015-06-23 ENCOUNTER — Encounter (HOSPITAL_COMMUNITY)
Admission: RE | Admit: 2015-06-23 | Discharge: 2015-06-23 | Disposition: A | Discharge status: Home / Self Care | Payer: PPO | Source: Ambulatory Visit | Attending: Orthopedic Surgery | Admitting: Orthopedic Surgery

## 2015-06-23 ENCOUNTER — Encounter (HOSPITAL_COMMUNITY): Payer: PPO

## 2015-06-23 ENCOUNTER — Other Ambulatory Visit: Payer: Self-pay | Admitting: *Deleted

## 2015-06-23 DIAGNOSIS — M25552 Pain in left hip: Secondary | ICD-10-CM | POA: Diagnosis not present

## 2015-06-23 MED ORDER — TECHNETIUM TC 99M MEDRONATE IV KIT
25.4000 | PACK | Freq: Once | INTRAVENOUS | Status: AC | PRN
  Administered 2015-06-23: 25.4 via INTRAVENOUS

## 2015-06-23 MED ORDER — AMITRIPTYLINE HCL 10 MG PO TABS: 10.0000 mg | ORAL_TABLET | Freq: Three times a day (TID) | ORAL | Status: DC | PRN

## 2015-06-23 NOTE — Telephone Encounter (Signed)
Pharmacy advised patient will need to pay cash for Amitriptyline,  since the cash cost is $10, per Dr Melford Aase.

## 2015-06-27 ENCOUNTER — Other Ambulatory Visit: Payer: Self-pay | Admitting: *Deleted

## 2015-06-27 MED ORDER — SPIRONOLACTONE 25 MG PO TABS: ORAL_TABLET | ORAL | Status: DC

## 2015-07-11 ENCOUNTER — Other Ambulatory Visit: Payer: Self-pay | Admitting: Internal Medicine

## 2015-07-22 ENCOUNTER — Other Ambulatory Visit: Payer: Self-pay

## 2015-07-22 ENCOUNTER — Ambulatory Visit (INDEPENDENT_AMBULATORY_CARE_PROVIDER_SITE_OTHER): Payer: PPO | Admitting: Physician Assistant

## 2015-07-22 ENCOUNTER — Encounter: Payer: Self-pay | Admitting: Physician Assistant

## 2015-07-22 VITALS — BP 130/66 | HR 75 | Temp 99.0°F | Resp 14 | Ht 62.0 in | Wt 182.0 lb

## 2015-07-22 DIAGNOSIS — E1365 Other specified diabetes mellitus with hyperglycemia: Secondary | ICD-10-CM

## 2015-07-22 DIAGNOSIS — Z23 Encounter for immunization: Secondary | ICD-10-CM

## 2015-07-22 DIAGNOSIS — N182 Chronic kidney disease, stage 2 (mild): Secondary | ICD-10-CM | POA: Diagnosis not present

## 2015-07-22 DIAGNOSIS — R079 Chest pain, unspecified: Secondary | ICD-10-CM

## 2015-07-22 DIAGNOSIS — E669 Obesity, unspecified: Secondary | ICD-10-CM | POA: Diagnosis not present

## 2015-07-22 DIAGNOSIS — E785 Hyperlipidemia, unspecified: Secondary | ICD-10-CM | POA: Diagnosis not present

## 2015-07-22 DIAGNOSIS — J42 Unspecified chronic bronchitis: Secondary | ICD-10-CM | POA: Diagnosis not present

## 2015-07-22 DIAGNOSIS — Z79899 Other long term (current) drug therapy: Secondary | ICD-10-CM | POA: Diagnosis not present

## 2015-07-22 DIAGNOSIS — E1322 Other specified diabetes mellitus with diabetic chronic kidney disease: Secondary | ICD-10-CM

## 2015-07-22 DIAGNOSIS — K7469 Other cirrhosis of liver: Secondary | ICD-10-CM | POA: Diagnosis not present

## 2015-07-22 DIAGNOSIS — I10 Essential (primary) hypertension: Secondary | ICD-10-CM

## 2015-07-22 DIAGNOSIS — IMO0002 Reserved for concepts with insufficient information to code with codable children: Secondary | ICD-10-CM

## 2015-07-22 LAB — HEPATIC FUNCTION PANEL
ALK PHOS: 99 U/L (ref 33–130)
ALT: 24 U/L (ref 6–29)
AST: 35 U/L (ref 10–35)
Albumin: 3.4 g/dL — ABNORMAL LOW (ref 3.6–5.1)
BILIRUBIN DIRECT: 0.2 mg/dL (ref <=0.2)
BILIRUBIN INDIRECT: 0.4 mg/dL (ref 0.2–1.2)
Total Bilirubin: 0.6 mg/dL (ref 0.2–1.2)
Total Protein: 5.4 g/dL — ABNORMAL LOW (ref 6.1–8.1)

## 2015-07-22 LAB — TSH: TSH: 3.144 u[IU]/mL (ref 0.350–4.500)

## 2015-07-22 LAB — LIPID PANEL
CHOL/HDL RATIO: 8 ratio — AB (ref <=5.0)
Cholesterol: 232 mg/dL — ABNORMAL HIGH (ref 125–200)
HDL: 29 mg/dL — ABNORMAL LOW (ref >=46)
Triglycerides: 460 mg/dL — ABNORMAL HIGH (ref <150)

## 2015-07-22 LAB — BASIC METABOLIC PANEL WITH GFR
BUN: 14 mg/dL (ref 7–25)
CHLORIDE: 97 mmol/L — AB (ref 98–110)
CO2: 34 mmol/L — AB (ref 20–31)
CREATININE: 1.14 mg/dL — AB (ref 0.50–0.99)
Calcium: 9 mg/dL (ref 8.6–10.4)
GFR, Est African American: 57 mL/min — ABNORMAL LOW (ref >=60)
GFR, Est Non African American: 50 mL/min — ABNORMAL LOW (ref >=60)
Glucose, Bld: 93 mg/dL (ref 65–99)
Potassium: 3.6 mmol/L (ref 3.5–5.3)
SODIUM: 139 mmol/L (ref 135–146)

## 2015-07-22 LAB — MAGNESIUM: Magnesium: 1.8 mg/dL (ref 1.5–2.5)

## 2015-07-22 NOTE — Patient Instructions (Signed)
Increase lasix to 35m twice daily Monitor your weight daily if it does not go down over the weekend go to the ER If your breathing gets worse or you have any chest pain go to the ER  Do the following things EVERYDAY: 1) Weigh yourself in the morning before breakfast. Write it down and keep it in a log. 2) Take your medicines as prescribed 3) Eat low salt foods-Limit salt (sodium) to 2000 mg per day. Best thing to do is avoid processed foods.   4) Stay as active as you can everyday 5) Limit all fluids for the day to less than 2 liters  Call your doctor if:  Anytime you have any of the following symptoms:  1) 3 pound weight gain in 24 hours or 5 pounds in 1 week  2) shortness of breath, with or without a dry hacking cough  3) swelling in the hands, feet or stomach  4) if you have to sleep on extra pillows at night in order to breathe. 5) after laying down at night for 20-30 mins, you wake up short of breath.   These can all be signs of fluid overload.   Chronic Obstructive Pulmonary Disease Chronic obstructive pulmonary disease (COPD) is a common lung condition in which airflow from the lungs is limited. COPD is a general term that can be used to describe many different lung problems that limit airflow, including both chronic bronchitis and emphysema. If you have COPD, your lung function will probably never return to normal, but there are measures you can take to improve lung function and make yourself feel better. CAUSES   Smoking (common).  Exposure to secondhand smoke.  Genetic problems.  Chronic inflammatory lung diseases or recurrent infections. SYMPTOMS  Shortness of breath, especially with physical activity.  Deep, persistent (chronic) cough with a large amount of thick mucus.  Wheezing.  Rapid breaths (tachypnea).  Gray or bluish discoloration (cyanosis) of the skin, especially in your fingers, toes, or lips.  Fatigue.  Weight loss.  Frequent infections or  episodes when breathing symptoms become much worse (exacerbations).  Chest tightness. DIAGNOSIS Your health care provider will take a medical history and perform a physical examination to diagnose COPD. Additional tests for COPD may include:  Lung (pulmonary) function tests.  Chest X-ray.  CT scan.  Blood tests. TREATMENT  Treatment for COPD may include:  Inhaler and nebulizer medicines. These help manage the symptoms of COPD and make your breathing more comfortable.  Supplemental oxygen. Supplemental oxygen is only helpful if you have a low oxygen level in your blood.  Exercise and physical activity. These are beneficial for nearly all people with COPD.  Lung surgery or transplant.  Nutrition therapy to gain weight, if you are underweight.  Pulmonary rehabilitation. This may involve working with a team of health care providers and specialists, such as respiratory, occupational, and physical therapists. HOME CARE INSTRUCTIONS  Take all medicines (inhaled or pills) as directed by your health care provider.  Avoid over-the-counter medicines or cough syrups that dry up your airway (such as antihistamines) and slow down the elimination of secretions unless instructed otherwise by your health care provider.  If you are a smoker, the most important thing that you can do is stop smoking. Continuing to smoke will cause further lung damage and breathing trouble. Ask your health care provider for help with quitting smoking. He or she can direct you to community resources or hospitals that provide support.  Avoid exposure to irritants such  as smoke, chemicals, and fumes that aggravate your breathing.  Use oxygen therapy and pulmonary rehabilitation if directed by your health care provider. If you require home oxygen therapy, ask your health care provider whether you should purchase a pulse oximeter to measure your oxygen level at home.  Avoid contact with individuals who have a contagious  illness.  Avoid extreme temperature and humidity changes.  Eat healthy foods. Eating smaller, more frequent meals and resting before meals may help you maintain your strength.  Stay active, but balance activity with periods of rest. Exercise and physical activity will help you maintain your ability to do things you want to do.  Preventing infection and hospitalization is very important when you have COPD. Make sure to receive all the vaccines your health care provider recommends, especially the pneumococcal and influenza vaccines. Ask your health care provider whether you need a pneumonia vaccine.  Learn and use relaxation techniques to manage stress.  Learn and use controlled breathing techniques as directed by your health care provider. Controlled breathing techniques include:  Pursed lip breathing. Start by breathing in (inhaling) through your nose for 1 second. Then, purse your lips as if you were going to whistle and breathe out (exhale) through the pursed lips for 2 seconds.  Diaphragmatic breathing. Start by putting one hand on your abdomen just above your waist. Inhale slowly through your nose. The hand on your abdomen should move out. Then purse your lips and exhale slowly. You should be able to feel the hand on your abdomen moving in as you exhale.  Learn and use controlled coughing to clear mucus from your lungs. Controlled coughing is a series of short, progressive coughs. The steps of controlled coughing are: 1. Lean your head slightly forward. 2. Breathe in deeply using diaphragmatic breathing. 3. Try to hold your breath for 3 seconds. 4. Keep your mouth slightly open while coughing twice. 5. Spit any mucus out into a tissue. 6. Rest and repeat the steps once or twice as needed. SEEK MEDICAL CARE IF:  You are coughing up more mucus than usual.  There is a change in the color or thickness of your mucus.  Your breathing is more labored than usual.  Your breathing is  faster than usual. SEEK IMMEDIATE MEDICAL CARE IF:  You have shortness of breath while you are resting.  You have shortness of breath that prevents you from:  Being able to talk.  Performing your usual physical activities.  You have chest pain lasting longer than 5 minutes.  Your skin color is more cyanotic than usual.  You measure low oxygen saturations for longer than 5 minutes with a pulse oximeter. MAKE SURE YOU:  Understand these instructions.  Will watch your condition.  Will get help right away if you are not doing well or get worse.   This information is not intended to replace advice given to you by your health care provider. Make sure you discuss any questions you have with your health care provider.   Document Released: 05/02/2005 Document Revised: 08/13/2014 Document Reviewed: 03/19/2013 Elsevier Interactive Patient Education [employer].

## 2015-07-22 NOTE — Progress Notes (Signed)
Assessment and Plan:  Hypertension: Continue medication, monitor blood pressure at home. Continue DASH diet.  Reminder to go to the ER if any CP, SOB, nausea, dizziness, severe HA, changes vision/speech, left arm numbness and tingling and jaw pain. Cholesterol: Continue diet and exercise. Check cholesterol.  Diabetes with diabetic chronic kidney disease-Continue diet and exercise. Check A1C Vitamin D Def- check level and continue medications.  Obesity with co morbidities- long discussion about weight loss, diet, and exercise Cirrhosis- check LFTs, albumin, CBC, follow up Dr. Deatra Ina PRN.  COPD- lungs with decreased breath sounds, continue O2, no wheezing/crackles, rhonchi.  CHF/corpulmonale- increase lasix to 37m BID, fluid restrict, follow up Monday, long discussion with patient that she needs to go to the ER if her weight is not going down or if her breathing gets worse over the weekend. .   Continue diet and meds as discussed. Further disposition pending results of labs. Discussed med's effects and SE's.   OVER 40 minutes of exam, counseling, chart review, referral performed   HPI 69y.o. smoking female with very complicated history presents for 3 month follow up with hypertension, hyperlipidemia, diabetes and vitamin D.  Her blood pressure has been controlled at home, today their BP is BP: 130/66 mmHg.  She does not workout. She denies chest pain, shortness of breath, dizziness.  She is not on cholesterol medication and denies myalgias. Her cholesterol is not at goal. The cholesterol was:  04/15/2015: Cholesterol 229*; HDL 28*; LDL Cholesterol NOT CALC; Triglycerides 528*  She has been working on diet and exercise for diabetes with diabetic chronic kidney disease, she is on bASA, she is not on ACE/ARB, and denies  paresthesia of the feet, polydipsia, polyuria and visual disturbances. Last A1C was: 04/15/2015: Hgb A1c MFr Bld 5.7*  Patient is on Vitamin D supplement. 04/15/2015: Vit D, 25-Hydroxy  53  She has cirrhosis and follows with Dr. KDeatra Ina  She has a longer history of pain and decreased ROM of her left shoulder, has had injection in the past, she follows with [facility]. Also has severe back pain, and left leg pain.  Patient has COPD, she is O2 dependent on 3 L O2, with [place]. She is on prednisone 1/2 pill every other day.  She states that she has worsening breathing and has AB distention, she has orthopnea, no PND. She states she has had some central chest discomfort but no cough, feels like something sitting on her chest. She had recent AB [place] Oct that showed no ascites, her weight is up 10 lbs from last visit. She is on lasix 830m1/2 pill twice a day and spirolactone 2534mID.  Wt Readings from Last 3 Encounters:  07/22/15 182 lb (82.555 kg)  05/25/15 172 lb (78.019 kg)  05/11/15 172 lb (78.019 kg)   Lab Results  Component Value Date   CREATININE 0.96 06/14/2015   BUN 14 06/14/2015   NA 139 06/14/2015   K 3.8 06/14/2015   CL 97* 06/14/2015   CO2 26 06/14/2015    Current Medications:  Current Outpatient Prescriptions on File Prior to Visit  Medication Sig Dispense Refill  . acetaminophen-codeine (TYLENOL #3) 300-30 MG tablet TAKE ONE-HALF TO ONE TABLET BY MOUTH FOUR TIMES DAILY AS NEEDED 120 tablet 3  . allopurinol (ZYLOPRIM) 300 MG tablet 1/2-1 tablet daily for gout 90 tablet 1  . amitriptyline (ELAVIL) 10 MG tablet TAKE 1 TABLET BY MOUTH 3 TIMES A DAY AS NEEDED 90 tablet 2  . amitriptyline (ELAVIL) 10  MG tablet Take 1 tablet (10 mg total) by mouth 3 (three) times daily as needed. 270 tablet 1  . aspirin 81 MG tablet Take 81 mg by mouth daily.    . benzonatate (TESSALON) 100 MG capsule TAKE 1 CAPSULE (100 MG TOTAL) BY MOUTH 3 (THREE) TIMES DAILY AS NEEDED FOR COUGH. 100 capsule 1  . cefUROXime (CEFTIN) 250 MG tablet Take 1 tablet (250 mg total) by mouth 2 (two) times daily. 20 tablet 0  . Cholecalciferol (VITAMIN D-3) 1000 UNITS CAPS Take 5,000 Units by mouth daily.     . citalopram (CELEXA) 40 MG tablet TAKE 1 TABLET BY MOUTH TWICE A  DAY FOR MOOD 60 tablet 3  . colchicine 0.6 MG tablet Take 1 tablet (0.6 mg total) by mouth daily. 30 tablet 2  . cyclobenzaprine (FLEXERIL) 10 MG tablet Take 1 tablet (10 mg total) by mouth 3 (three) times daily as needed for muscle spasms. 30 tablet 0  . ferrous sulfate 325 (65 FE) MG tablet TAKE 1 TABLET TWICE DAILY (Patient taking differently: TAKE 1 TABLET ONCE DAILY) 60 tablet 5  . fluconazole (DIFLUCAN) 150 MG tablet Take 1 tablet (150 mg total) by mouth once. 1 tablet 3  . furosemide (LASIX) 80 MG tablet Take 1 tablet (80 mg total) by mouth 2 (two) times daily. 180 tablet 3  . gabapentin (NEURONTIN) 300 MG capsule take 1 capsule by mouth in the morning, 1 capsule in the afternoon and 3 capsules at night  2  . ipratropium (ATROVENT) 0.02 % nebulizer solution use 1 vial in nebulizer EVERY 4 HOURS AS NEEDED FOR WHEEZING OR SHORTNESS OF BREATH 75 mL 2  . Magnesium 250 MG TABS Take 250 mg by mouth daily at 6 (six) AM.    . metFORMIN (GLUCOPHAGE-XR) 500 MG 24 hr tablet TAKE 2 TABLETS BY MOUTH TWICE DAILY AFTER MEALS 360 tablet 3  . nadolol (CORGARD) 40 MG tablet Take 1 tablet (40 mg total) by mouth daily. 90 tablet 1  . Omega-3 Fatty Acids (FISH OIL) 1000 MG CAPS Take 1,000 mg by mouth 2 (two) times daily.    Marland Kitchen OVER THE COUNTER MEDICATION Take 1-2 tablets by mouth as needed (constipation). Natural Veg Laxative    . OXYGEN Inhale 3 L/min into the lungs continuous as needed. On exertion and at home    . pantoprazole (PROTONIX) 40 MG tablet Take 1 tablet (40 mg total) by mouth 2 (two) times daily. 180 tablet 1  . predniSONE (DELTASONE) 20 MG tablet Take 1/2 tablet to 1 tablet every other day until next office visit. 30 tablet 3  . spironolactone (ALDACTONE) 25 MG tablet Take 1 tablet (25 mg total) by mouth 3 times a day. For fluids and swelling 90 tablet 1  . tamoxifen (NOLVADEX) 20 MG tablet Take 1 tablet (20 mg total) by mouth  daily. 90 tablet 2  . terconazole (TERAZOL 7) 0.4 % vaginal cream Place 1 applicator vaginally at bedtime. 45 g 2  . triamcinolone cream (KENALOG) 0.1 % Apply 1 application topically 3 (three) times daily. 30 g 0   No current facility-administered medications on file prior to visit.   Medical History:  Past Medical History  Diagnosis Date  . Hx of shoulder replacement   . Hypertension   . Hyperlipidemia   . COPD (chronic obstructive pulmonary disease) ([place])   . Vitamin D deficiency   . IBS (irritable bowel syndrome)   . Fibromyalgia    Allergies:  Allergies  Allergen Reactions  .  Atorvastatin     Other reaction(s): Other (See Comments) Doesn't remember  . Diphenhydramine Hcl   . Diphenhydramine Hcl (Sleep) Hives  . Gemfibrozil     Other reaction(s): Other (See Comments) Doesn't remember  . Hydrocodone-Acetaminophen   . Hydrocodone-Acetaminophen     Other reaction(s): Other (See Comments) Doesn't remember  . Loratadine Hives  . Loratadine-Pseudoephedrine Er   . Lorazepam Hives  . Simvastatin   . Sulfamethoxazole Hives  . Sulfonamide Derivatives     Review of Systems:  Review of Systems  Constitutional: Positive for malaise/fatigue. Negative for fever, chills, weight loss and diaphoresis.  HENT: Negative for congestion, ear discharge, ear pain, hearing loss, nosebleeds, sore throat and tinnitus.   Eyes: Negative.   Respiratory: Positive for cough and shortness of breath. Negative for hemoptysis, sputum production, wheezing and stridor.   Cardiovascular: Positive for chest pain, orthopnea and leg swelling. Negative for palpitations, claudication and PND.  Gastrointestinal: Positive for abdominal pain (RUQ, and states seems more distended than usual). Negative for heartburn, nausea, vomiting, diarrhea, constipation, blood in stool and melena.  Genitourinary: Negative.   Musculoskeletal: Positive for myalgias.  Skin: Negative.  Negative for itching and rash.   Neurological: Positive for dizziness (better since decreased gapapentin). Negative for tingling, tremors, sensory change, speech change, focal weakness, seizures, loss of consciousness, weakness and headaches.  Psychiatric/Behavioral: Negative.     Family history- Review and unchanged Social history- Review and unchanged Physical Exam: BP 130/66 mmHg  Pulse 75  Temp(Src) 99 F (37.2 C) (Temporal)  Resp 14  Ht 5' 2"  (1.575 m)  Wt 182 lb (82.555 kg)  BMI 33.28 kg/m2  SpO2 95% Wt Readings from Last 3 Encounters:  07/22/15 182 lb (82.555 kg)  05/25/15 172 lb (78.019 kg)  05/11/15 172 lb (78.019 kg)   General Appearance: Well nourished, in no apparent distress. Eyes: PERRLA, EOMs, conjunctiva no swelling or erythema Sinuses: No Frontal/maxillary tenderness ENT/Mouth: Ext aud canals clear, TMs without erythema, bulging. No erythema, swelling, or exudate on post pharynx.  Tonsils not swollen or erythematous. Neck: Supple, thyroid normal.  Respiratory: diffuse decreased breath sounds, BS equal bilaterally without rales, rhonchi, wheezing or stridor.  Cardio: RRR with no MRGs. Brisk peripheral pulses with bilateral edema, with stasis dermatitis, depdendent rubor Abdomen: Soft, + BS, Distended, LUQ/epigastric tender, no guarding, rebound, hernias, masses. Lymphatics: + right side tender cervical lymphadenopathy.  Musculoskeletal: Full ROM, 4/5 strength, in wheelchair, Decreased ROM left shoulder due to pain.  Skin: Warm, dry without rashes, lesions, ecchymosis.  Neuro: Cranial nerves intact. No cerebellar symptoms.  Psych: Awake and oriented X 3, normal affect, Insight and Judgment appropriate.    Vicie Mutters, PA-C 11:54 AM [facility]

## 2015-07-23 LAB — HEMOGLOBIN A1C
HEMOGLOBIN A1C: 5.9 % — AB (ref <5.7)
MEAN PLASMA GLUCOSE: 123 mg/dL — AB (ref <117)

## 2015-07-23 LAB — CBC WITH DIFFERENTIAL/PLATELET
BASOS PCT: 0 % (ref 0–1)
Basophils Absolute: 0 10*3/uL (ref 0.0–0.1)
Eosinophils Absolute: 0.4 10*3/uL (ref 0.0–0.7)
Eosinophils Relative: 8 % — ABNORMAL HIGH (ref 0–5)
HCT: 31 % — ABNORMAL LOW (ref 36.0–46.0)
HEMOGLOBIN: 10.6 g/dL — AB (ref 12.0–15.0)
LYMPHS ABS: 1.4 10*3/uL (ref 0.7–4.0)
Lymphocytes Relative: 27 % (ref 12–46)
MCH: 32.7 pg (ref 26.0–34.0)
MCHC: 34.2 g/dL (ref 30.0–36.0)
MCV: 95.7 fL (ref 78.0–100.0)
MONOS PCT: 5 % (ref 3–12)
MPV: 11.8 fL (ref 8.6–12.4)
Monocytes Absolute: 0.3 10*3/uL (ref 0.1–1.0)
NEUTROS ABS: 3.2 10*3/uL (ref 1.7–7.7)
NEUTROS PCT: 60 % (ref 43–77)
Platelets: 90 10*3/uL — ABNORMAL LOW (ref 150–400)
RBC: 3.24 MIL/uL — ABNORMAL LOW (ref 3.87–5.11)
RDW: 16.1 % — ABNORMAL HIGH (ref 11.5–15.5)
WBC: 5.3 10*3/uL (ref 4.0–10.5)

## 2015-07-26 ENCOUNTER — Emergency Department (HOSPITAL_COMMUNITY)
Admission: EM | Admit: 2015-07-26 | Discharge: 2015-07-26 | Disposition: A | Discharge status: Home / Self Care | Payer: PPO | Attending: Emergency Medicine | Admitting: Emergency Medicine

## 2015-07-26 ENCOUNTER — Emergency Department (HOSPITAL_COMMUNITY): Payer: PPO

## 2015-07-26 ENCOUNTER — Ambulatory Visit (INDEPENDENT_AMBULATORY_CARE_PROVIDER_SITE_OTHER): Payer: PPO | Admitting: Physician Assistant

## 2015-07-26 ENCOUNTER — Encounter (HOSPITAL_COMMUNITY): Payer: Self-pay | Admitting: Emergency Medicine

## 2015-07-26 ENCOUNTER — Encounter: Payer: Self-pay | Admitting: Physician Assistant

## 2015-07-26 VITALS — BP 130/70 | HR 74 | Temp 98.6°F | Resp 14 | Ht 62.0 in | Wt 179.0 lb

## 2015-07-26 DIAGNOSIS — Z7982 Long term (current) use of aspirin: Secondary | ICD-10-CM | POA: Diagnosis not present

## 2015-07-26 DIAGNOSIS — R0600 Dyspnea, unspecified: Secondary | ICD-10-CM

## 2015-07-26 DIAGNOSIS — I10 Essential (primary) hypertension: Secondary | ICD-10-CM | POA: Insufficient documentation

## 2015-07-26 DIAGNOSIS — M542 Cervicalgia: Secondary | ICD-10-CM | POA: Diagnosis not present

## 2015-07-26 DIAGNOSIS — K7460 Unspecified cirrhosis of liver: Secondary | ICD-10-CM | POA: Diagnosis not present

## 2015-07-26 DIAGNOSIS — J441 Chronic obstructive pulmonary disease with (acute) exacerbation: Secondary | ICD-10-CM | POA: Diagnosis not present

## 2015-07-26 DIAGNOSIS — J42 Unspecified chronic bronchitis: Secondary | ICD-10-CM

## 2015-07-26 DIAGNOSIS — M797 Fibromyalgia: Secondary | ICD-10-CM | POA: Insufficient documentation

## 2015-07-26 DIAGNOSIS — I872 Venous insufficiency (chronic) (peripheral): Secondary | ICD-10-CM

## 2015-07-26 DIAGNOSIS — K7469 Other cirrhosis of liver: Secondary | ICD-10-CM

## 2015-07-26 DIAGNOSIS — Z79899 Other long term (current) drug therapy: Secondary | ICD-10-CM | POA: Diagnosis not present

## 2015-07-26 DIAGNOSIS — R609 Edema, unspecified: Secondary | ICD-10-CM | POA: Diagnosis not present

## 2015-07-26 DIAGNOSIS — Z8639 Personal history of other endocrine, nutritional and metabolic disease: Secondary | ICD-10-CM | POA: Insufficient documentation

## 2015-07-26 DIAGNOSIS — Z7952 Long term (current) use of systemic steroids: Secondary | ICD-10-CM | POA: Diagnosis not present

## 2015-07-26 DIAGNOSIS — Z792 Long term (current) use of antibiotics: Secondary | ICD-10-CM | POA: Insufficient documentation

## 2015-07-26 DIAGNOSIS — K7030 Alcoholic cirrhosis of liver without ascites: Secondary | ICD-10-CM

## 2015-07-26 DIAGNOSIS — L03115 Cellulitis of right lower limb: Secondary | ICD-10-CM | POA: Diagnosis not present

## 2015-07-26 DIAGNOSIS — Z7984 Long term (current) use of oral hypoglycemic drugs: Secondary | ICD-10-CM | POA: Diagnosis not present

## 2015-07-26 DIAGNOSIS — Z87891 Personal history of nicotine dependence: Secondary | ICD-10-CM | POA: Insufficient documentation

## 2015-07-26 DIAGNOSIS — R2241 Localized swelling, mass and lump, right lower limb: Secondary | ICD-10-CM | POA: Diagnosis present

## 2015-07-26 HISTORY — DX: Unspecified cirrhosis of liver: K74.60

## 2015-07-26 LAB — BASIC METABOLIC PANEL
Anion gap: 16 — ABNORMAL HIGH (ref 5–15)
BUN: 17 mg/dL (ref 6–20)
CHLORIDE: 98 mmol/L — AB (ref 101–111)
CO2: 29 mmol/L (ref 22–32)
CREATININE: 1.04 mg/dL — AB (ref 0.44–1.00)
Calcium: 9 mg/dL (ref 8.9–10.3)
GFR calc Af Amer: >60 mL/min (ref >60)
GFR, EST NON AFRICAN AMERICAN: 54 mL/min — AB (ref >60)
Glucose, Bld: 192 mg/dL — ABNORMAL HIGH (ref 65–99)
Potassium: 3.7 mmol/L (ref 3.5–5.1)
SODIUM: 143 mmol/L (ref 135–145)

## 2015-07-26 LAB — CBC
HCT: 35.1 % — ABNORMAL LOW (ref 36.0–46.0)
Hemoglobin: 11.4 g/dL — ABNORMAL LOW (ref 12.0–15.0)
MCH: 32.5 pg (ref 26.0–34.0)
MCHC: 32.5 g/dL (ref 30.0–36.0)
MCV: 100 fL (ref 78.0–100.0)
Platelets: 93 10*3/uL — ABNORMAL LOW (ref 150–400)
RBC: 3.51 MIL/uL — AB (ref 3.87–5.11)
RDW: 15.4 % (ref 11.5–15.5)
WBC: 6.4 10*3/uL (ref 4.0–10.5)

## 2015-07-26 LAB — I-STAT TROPONIN, ED: TROPONIN I, POC: 0.03 ng/mL (ref 0.00–0.08)

## 2015-07-26 LAB — BRAIN NATRIURETIC PEPTIDE: B NATRIURETIC PEPTIDE 5: 58.8 pg/mL (ref 0.0–100.0)

## 2015-07-26 MED ORDER — DEXAMETHASONE SODIUM PHOSPHATE 10 MG/ML IJ SOLN
10.0000 mg | Freq: Once | INTRAMUSCULAR | Status: AC
  Administered 2015-07-26: 10 mg via INTRAVENOUS
  Filled 2015-07-26: qty 1

## 2015-07-26 MED ORDER — DOXYCYCLINE HYCLATE 100 MG PO CAPS: 100.0000 mg | ORAL_CAPSULE | Freq: Two times a day (BID) | ORAL | Status: DC

## 2015-07-26 MED ORDER — PREDNISONE 10 MG PO TABS: 60.0000 mg | ORAL_TABLET | Freq: Every day | ORAL | Status: DC

## 2015-07-26 MED ORDER — ACETAMINOPHEN-CODEINE #3 300-30 MG PO TABS
1.0000 | ORAL_TABLET | Freq: Once | ORAL | Status: AC
  Administered 2015-07-26: 1 via ORAL
  Filled 2015-07-26: qty 1

## 2015-07-26 MED ORDER — IPRATROPIUM-ALBUTEROL 0.5-2.5 (3) MG/3ML IN SOLN
3.0000 mL | Freq: Once | RESPIRATORY_TRACT | Status: AC
  Administered 2015-07-26: 3 mL via RESPIRATORY_TRACT
  Filled 2015-07-26: qty 3

## 2015-07-26 MED ORDER — GUAIFENESIN ER 600 MG PO TB12
1200.0000 mg | ORAL_TABLET | Freq: Two times a day (BID) | ORAL | Status: DC
  Filled 2015-07-26: qty 2

## 2015-07-26 MED ORDER — PREDNISONE 20 MG PO TABS: ORAL_TABLET | ORAL | Status: DC

## 2015-07-26 MED ORDER — FUROSEMIDE 40 MG PO TABS: 120.0000 mg | ORAL_TABLET | Freq: Two times a day (BID) | ORAL | Status: DC

## 2015-07-26 MED ORDER — DEXTROSE 5 % IV SOLN
1.0000 g | Freq: Once | INTRAVENOUS | Status: AC
  Administered 2015-07-26: 1 g via INTRAVENOUS
  Filled 2015-07-26: qty 10

## 2015-07-26 MED ORDER — FUROSEMIDE 80 MG PO TABS: 80.0000 mg | ORAL_TABLET | Freq: Two times a day (BID) | ORAL | Status: DC

## 2015-07-26 MED ORDER — FUROSEMIDE 10 MG/ML IJ SOLN
80.0000 mg | Freq: Once | INTRAMUSCULAR | Status: AC
  Administered 2015-07-26: 80 mg via INTRAVENOUS
  Filled 2015-07-26: qty 8

## 2015-07-26 MED ORDER — GUAIFENESIN 100 MG/5ML PO LIQD: 100.0000 mg | ORAL | Status: DC | PRN

## 2015-07-26 MED ORDER — LEVOFLOXACIN 500 MG PO TABS: 500.0000 mg | ORAL_TABLET | Freq: Every day | ORAL | Status: DC

## 2015-07-26 NOTE — Discharge Instructions (Signed)
Please read and follow all provided instructions.  Your diagnoses today include:  1. COPD exacerbation ([place])   2. Cellulitis of right lower extremity   3. Dyspnea   4. Cirrhosis of liver without ascites, unspecified hepatic cirrhosis type ([place])     Tests performed today include:  Chest x-ray - did not show pneumonia  Vital signs. See below for your results today.   Medications prescribed:   Take any prescribed medications only as directed.  Home care instructions:  Follow any educational materials contained in this packet.  Follow-up instructions: Please follow-up with your primary care provider in the next 1-2 days for evaluation of your COPD exacerbation and fluid status.   Return instructions:   Please return to the Emergency Department if you experience worsening symptoms.  Please return with worsening wheezing, shortness of breath, or difficulty breathing.  Return with persistent fever above 101F.   Please return if you have any other emergent concerns.  Additional Information:  Your vital signs today were: BP 113/72 mmHg   Pulse 67   Temp(Src) 97.9 F (36.6 C) (Oral)   Resp 15   Ht 5' 2.5" (1.588 m)   Wt 81.647 kg   BMI 32.38 kg/m2   SpO2 100% If your blood pressure (BP) was elevated above 135/85 this visit, please have this repeated by your doctor within one month. --------------

## 2015-07-26 NOTE — ED Notes (Signed)
Patient not in room patient sent to X-ray by triage staff.

## 2015-07-26 NOTE — Consult Note (Signed)
[facility] Medical Consultation  [name] HYQ:[id number] DOB: 1946/04/29 DOA: 07/26/2015 PCP: Alesia Richards, MD   Requesting physician: PA Chanda Busing Date of consultation: 07/26/15 Reason for consultation: SOB  Impression/Recommendations Active Problems:   Hepatic cirrhosis ([place])   Peripheral edema   COPD exacerbation ([place])   Venous insufficiency   Cellulitis   Abdominal discomfort  SOB: Multifactorial. Patient at baseline O2 requirement of 3 L nasal cannula. BNP 58, troponin negative, EKG without evidence of ACS. Approximately 20 pound weight gain over the last 3 weeks. Suspect that this is due to COPD exacerbation and progressive liver failure and possibly from beginnings of CHF. No recent echo per patient, Epic for per discussion with primary care physician. Abd [place] w/o evidence of ascites. BP and Cr stable. bilat LE swelling makes PE very unlikely.  - Decadron 57m IV x1 followed by Prednisone 654mx5 days - Duoneb followed by albuterol Q4 x24 hrs - Mucinex - Doxycycline - Lasix IV 80 in ED (increase home lasix to 120 BID) - f/u PCP in 1-2 days. Pt to call PCP on 07/27/15 for appt.  LE skin changes. Attention with early cellulitis. Given patient's significant comorbidities with air on the side of treatment at this point time. Due to patient's respiratory complaints she'll be started on Levaquin which should be adequate for this. Suspect this will improve significantly and lower cavity edema improves. - Doxycycline as above (single dose of Ceftriaxone 1gm in ED) - silvadene cream for outpt use.   Cirrhosis: MELD score 13. F/u PCP/GI as previously indicated. No evidence of SBP at this time.   Abdominal discomfort: at baseline per pt. Mild superficial RUQ and non-focal. Suspect from chronic cirrhosis and abd distension/fluid and MSK. Unlikely SBP. - Ibuprofen  Chief Complaint: SOB  HPI:  Shortness breath. Started 7 days ago. Somewhat intermittent but now  constant. Improves slightly with rest. Patient on 3 L nasal cannula 24 7 at home. There is been no change in her home O2 requirement. States that she's gained approximately 20 pounds over the last 3 weeks. Was seen by her primary care physician a few days ago and was increased on her Lasix from 40 mg twice a day to 80 mg twice a day. Since that time she has lost approximately 2-3 pounds. She also developed right lower extremity leg blisters as her leg started to swell. Most of these have popped and have become mildly tender. Slight increase in the redness of her right lower extremity from baseline. Problem is constant and getting worse. Patient does endorse occasional wheezing and coughing which is worse than her normal. This improves with breathing treatments.  Review of Systems:  Denies chest pain, palpitations, change in mentation, fevers, vomiting, diarrhea, constipation, neck stiffness, headache, icterus, change in appetite.   As above w/ all other systems negative.   Past Medical History  Diagnosis Date  . Hx of shoulder replacement   . Hypertension   . Hyperlipidemia   . COPD (chronic obstructive pulmonary disease) ([facility]  . Vitamin D deficiency   . IBS (irritable bowel syndrome)   . Fibromyalgia   . [facility] ([facility]   Past Surgical History  Procedure Laterality Date  . Hip arthroplasty Bilateral   . Shoulder surgery Right   . Back surgery    . Cholecystectomy    . Neck surgery    . Esophagogastroduodenoscopy  07/16/2012    Procedure: ESOPHAGOGASTRODUODENOSCOPY (EGD);  Surgeon: RoInda CastleMD;  [facility];  Service: Endoscopy;  Laterality: N/A;  . Gastric varices banding  07/16/2012    Procedure: GASTRIC VARICES BANDING;  Surgeon: Inda Castle, MD;  Location: [facility] ENDOSCOPY;  Service: Endoscopy;  Laterality: N/A;   Social History:  reports that she quit smoking about 8 months ago. Her smoking use included Cigarettes. She has a 50 pack-year smoking  history. She has never used smokeless tobacco. She reports that she drinks alcohol. She reports that she does not use illicit drugs.  Allergies  Allergen Reactions  . Atorvastatin     Other reaction(s): Other (See Comments) Doesn't remember  . Diphenhydramine Hcl   . Diphenhydramine Hcl (Sleep) Hives  . Gemfibrozil     Other reaction(s): Other (See Comments) Doesn't remember  . Hydrocodone-Acetaminophen   . Hydrocodone-Acetaminophen     Other reaction(s): Other (See Comments) Doesn't remember  . Loratadine Hives  . Loratadine-Pseudoephedrine Er   . Lorazepam Hives  . Simvastatin   . Sulfamethoxazole Hives  . Sulfonamide Derivatives    Family History  Problem Relation Age of Onset  . Colon cancer Neg Hx   . Diabetes Brother   . Heart disease Sister     A Fib  . Heart disease Mother     Prior to Admission medications   Medication Sig Start Date End Date Taking? Authorizing Provider  acetaminophen-codeine (TYLENOL #3) 300-30 MG tablet TAKE ONE-HALF TO ONE TABLET BY MOUTH FOUR TIMES DAILY AS NEEDED 05/26/15   Unk Pinto, MD  allopurinol (ZYLOPRIM) 300 MG tablet 1/2-1 tablet daily for gout 03/03/15 03/02/18  Unk Pinto, MD  amitriptyline (ELAVIL) 10 MG tablet TAKE 1 TABLET BY MOUTH 3 TIMES A DAY AS NEEDED 11/08/14   Unk Pinto, MD  amitriptyline (ELAVIL) 10 MG tablet Take 1 tablet (10 mg total) by mouth 3 (three) times daily as needed. 06/23/15   Unk Pinto, MD  amoxicillin (AMOXIL) 500 MG capsule TAKE 4 TABLETS BY MOUTH 1-2 HOURS PRIOR TO DENTAL WORK 04/26/15   Historical Provider, MD  aspirin 81 MG tablet Take 81 mg by mouth daily.    Historical Provider, MD  benzonatate (TESSALON) 100 MG capsule TAKE 1 CAPSULE (100 MG TOTAL) BY MOUTH 3 (THREE) TIMES DAILY AS NEEDED FOR COUGH. 04/23/14   Unk Pinto, MD  cefUROXime (CEFTIN) 250 MG tablet Take 1 tablet (250 mg total) by mouth 2 (two) times daily. 06/22/15   Vicie Mutters, PA-C  Cholecalciferol (VITAMIN D-3)  1000 UNITS CAPS Take 5,000 Units by mouth daily.    Historical Provider, MD  citalopram (CELEXA) 40 MG tablet TAKE 1 TABLET BY MOUTH TWICE A  DAY FOR MOOD 05/23/15   Unk Pinto, MD  citalopram (CELEXA) 40 MG tablet  08/09/14   Historical Provider, MD  colchicine 0.6 MG tablet Take 1 tablet (0.6 mg total) by mouth daily. 03/09/15 03/08/16  Unk Pinto, MD  cyclobenzaprine (FLEXERIL) 10 MG tablet Take 1 tablet (10 mg total) by mouth 3 (three) times daily as needed for muscle spasms. 11/17/14   Courtney Forcucci, PA-C  ferrous sulfate 325 (65 FE) MG tablet TAKE 1 TABLET TWICE DAILY Patient taking differently: TAKE 1 TABLET ONCE DAILY 01/19/13   Inda Castle, MD  fluconazole (DIFLUCAN) 150 MG tablet Take 1 tablet (150 mg total) by mouth once. 05/04/15   Vicie Mutters, PA-C  furosemide (LASIX) 80 MG tablet Take 1 tablet (80 mg total) by mouth 2 (two) times daily. 01/13/15 01/13/16  Vicie Mutters, PA-C  gabapentin (NEURONTIN) 300 MG capsule take 1 capsule  by mouth in the morning, 1 capsule in the afternoon and 3 capsules at night 12/13/14   Historical Provider, MD  ipratropium (ATROVENT) 0.02 % nebulizer solution use 1 vial in nebulizer EVERY 4 HOURS AS NEEDED FOR WHEEZING OR SHORTNESS OF BREATH 06/15/15   Vicie Mutters, PA-C  IRON PO Take 325 mg by mouth.    Historical Provider, MD  Magnesium 250 MG TABS Take 250 mg by mouth daily at 6 (six) AM.    Historical Provider, MD  metFORMIN (GLUCOPHAGE-XR) 500 MG 24 hr tablet TAKE 2 TABLETS BY MOUTH TWICE DAILY AFTER MEALS 12/21/14   Unk Pinto, MD  nadolol (CORGARD) 40 MG tablet Take 1 tablet (40 mg total) by mouth daily. 07/11/15   Unk Pinto, MD  Omega-3 Fatty Acids (FISH OIL) 1000 MG CAPS Take 1,000 mg by mouth 2 (two) times daily.    Historical Provider, MD  OVER THE COUNTER MEDICATION Take 1-2 tablets by mouth as needed (constipation). Natural Veg Laxative    Historical Provider, MD  OXYGEN Inhale 3 L/min into the lungs continuous as needed. On  exertion and at home    Historical Provider, MD  pantoprazole (PROTONIX) 40 MG tablet Take 1 tablet (40 mg total) by mouth 2 (two) times daily. 12/11/14   Unk Pinto, MD  predniSONE (DELTASONE) 20 MG tablet Take 1/2 tablet to 1 tablet every other day until next office visit. 05/11/15   Unk Pinto, MD  spironolactone (ALDACTONE) 25 MG tablet Take 1 tablet (25 mg total) by mouth 3 times a day. For fluids and swelling 06/27/15   Unk Pinto, MD  tamoxifen (NOLVADEX) 20 MG tablet Take 1 tablet (20 mg total) by mouth daily. 04/26/15   Courtney Forcucci, PA-C  terconazole (TERAZOL 7) 0.4 % vaginal cream Place 1 applicator vaginally at bedtime. 05/04/15   Vicie Mutters, PA-C  triamcinolone cream (KENALOG) 0.1 % Apply 1 application topically 3 (three) times daily. 05/16/15   Starlyn Skeans, PA-C   Physical Exam: Blood pressure 113/72, pulse 67, temperature 97.9 F (36.6 C), temperature source Oral, resp. rate 15, height 5' 2.5" (1.588 m), weight 81.647 kg (180 lb), SpO2 100 %. Filed Vitals:   07/26/15 1515 07/26/15 1530  BP: 119/67 113/72  Pulse: 65 67  Temp:    Resp: 13 15     General:  NAD, resting comfortably  Eyes: EOMI, PERRL, conjunctiva nml  ENT: mmm  Neck: FROM, no JVD  Cardiovascular: RRR, no m/r/g, 2+ pulses, 1+ LE pitting edema  Respiratory: decreased breath sounds throughout w/ few scattered wheezing. No crackles or ronchi  Abdomen: intermittent diffuse mild RUQ ttp, NABS  Skin: anterior erythema bilat of the legs w/ scabs on R w/ slight induration and intermittent ttp  Musculoskeletal: no effusions. No bony abnormality  Psychiatric: Nml affect, and thought process  Neurologic: CN2-12 grossly intact. Moves all extremities in coordinated fashion  Labs on Admission:  Basic Metabolic Panel:  Recent Labs Lab 07/22/15 1218 07/26/15 1300  NA 139 143  K 3.6 3.7  CL 97* 98*  CO2 34* 29  GLUCOSE 93 192*  BUN 14 17  CREATININE 1.14* 1.04*  CALCIUM 9.0  9.0  MG 1.8  --    Liver Function Tests:  Recent Labs Lab 07/22/15 1218  AST 35  ALT 24  ALKPHOS 99  BILITOT 0.6  PROT 5.4*  ALBUMIN 3.4*   No results for input(s): LIPASE, AMYLASE in the last 168 hours. No results for input(s): AMMONIA in the last 168 hours. CBC:  Recent Labs Lab 07/22/15 1218 07/26/15 1300  WBC 5.3 6.4  NEUTROABS 3.2  --   HGB 10.6* 11.4*  HCT 31.0* 35.1*  MCV 95.7 100.0  PLT 90* 93*   Cardiac Enzymes: No results for input(s): CKTOTAL, CKMB, CKMBINDEX, TROPONINI in the last 168 hours. BNP: Invalid input(s): POCBNP CBG: No results for input(s): GLUCAP in the last 168 hours.  Radiological Exams on Admission: Dg Chest 2 View  07/26/2015  CLINICAL DATA:  Shortness of Breath EXAM: CHEST  2 VIEW COMPARISON:  May 09, 2015 FINDINGS: There is no edema or consolidation. The heart size and pulmonary vascularity are normal. No adenopathy. There is evidence of a total shoulder replacement on the right with remodeling in the proximal right humerus. There is postoperative change in the lower cervical spine region. IMPRESSION: No edema or consolidation. Electronically Signed   By: Lowella Grip III M.D.   On: 07/26/2015 13:47     MERRELL, DAVID J [facility]   If 7PM-7AM, please contact night-coverage [email or web address] Password [email or web address] 07/26/2015, 4:40 PM

## 2015-07-26 NOTE — ED Provider Notes (Signed)
CSN: [id number]     Arrival date & time 07/26/15  1230 History   First MD Initiated Contact with Patient 07/26/15 1341     Chief Complaint  Patient presents with  . Shortness of Breath  . Leg Swelling   (Consider location/radiation/quality/duration/timing/severity/associated sxs/prior Treatment) HPI  69 y.o. female with a hx of CHF, COPD, HTN, HLD, DM, Liver Cirrhosis, presents to the Emergency Department today complaining of shortness of breath and abdominal pain. She was seen in an internal medicine clinic today and told to come to the ED due to her dyspnea, abd distention, and anterior leg redness on her right leg that is swollen/weeping/warm since Saturday. She rates the abdominal pain a 8/10 and feels like a stretching sensation. When asked about the distention, pt states that it is due to Cirrhosis from use of Lyrica and does not drink alcohol. She is on oxygen 3L normally and is not active. She has nausea with no vomiting. No fever. Dyspnea at rest.  Past Medical History  Diagnosis Date  . Hx of shoulder replacement   . Hypertension   . Hyperlipidemia   . COPD (chronic obstructive pulmonary disease) ([place])   . Vitamin D deficiency   . IBS (irritable bowel syndrome)   . Fibromyalgia    Past Surgical History  Procedure Laterality Date  . Hip arthroplasty Bilateral   . Shoulder surgery Right   . Back surgery    . Cholecystectomy    . Neck surgery    . Esophagogastroduodenoscopy  07/16/2012    Procedure: ESOPHAGOGASTRODUODENOSCOPY (EGD);  Surgeon: Inda Castle, MD;  [facility];  Service: Endoscopy;  Laterality: N/A;  . Gastric varices banding  07/16/2012    Procedure: GASTRIC VARICES BANDING;  Surgeon: Inda Castle, MD;  Location: [facility];  Service: Endoscopy;  Laterality: N/A;   Family History  Problem Relation Age of Onset  . Colon cancer Neg Hx   . Diabetes Brother   . Heart disease Sister     A Fib  . Heart disease Mother    Social History    Substance Use Topics  . Smoking status: Former Smoker -- 1.00 packs/day for 50 years    Types: Cigarettes    Quit date: 11/05/2014  . Smokeless tobacco: Never Used     Comment: Tobacco info given 10/21/13  . Alcohol Use: 0.0 oz/week    0 Standard drinks or equivalent per week     Comment: rarely   OB History    No data available     Review of Systems  Constitutional: Negative for fever, chills, diaphoresis and fatigue.  HENT: Negative for congestion, sinus pressure, sore throat and tinnitus.   Eyes: Negative for visual disturbance.  Respiratory: Positive for chest tightness, shortness of breath and wheezing. Negative for cough.   Cardiovascular: Positive for chest pain.  Gastrointestinal: Positive for nausea, abdominal pain and abdominal distention. Negative for vomiting, diarrhea and constipation.  Endocrine: Negative for cold intolerance and heat intolerance.  Musculoskeletal: Positive for neck pain.  Skin: Negative for color change.  Neurological: Negative for dizziness, syncope, weakness, numbness and headaches.   Allergies  Atorvastatin; Diphenhydramine hcl; Diphenhydramine hcl (sleep); Gemfibrozil; Hydrocodone-acetaminophen; Hydrocodone-acetaminophen; Loratadine; Loratadine-pseudoephedrine er; Lorazepam; Simvastatin; Sulfamethoxazole; and Sulfonamide derivatives  Home Medications   Prior to Admission medications   Medication Sig Start Date End Date Taking? Authorizing Provider  acetaminophen-codeine (TYLENOL #3) 300-30 MG tablet TAKE ONE-HALF TO ONE TABLET BY MOUTH FOUR TIMES DAILY AS NEEDED 05/26/15   Unk Pinto,  MD  allopurinol (ZYLOPRIM) 300 MG tablet 1/2-1 tablet daily for gout 03/03/15 03/02/18  Unk Pinto, MD  amitriptyline (ELAVIL) 10 MG tablet TAKE 1 TABLET BY MOUTH 3 TIMES A DAY AS NEEDED 11/08/14   Unk Pinto, MD  amitriptyline (ELAVIL) 10 MG tablet Take 1 tablet (10 mg total) by mouth 3 (three) times daily as needed. 06/23/15   Unk Pinto, MD   amoxicillin (AMOXIL) 500 MG capsule TAKE 4 TABLETS BY MOUTH 1-2 HOURS PRIOR TO DENTAL WORK 04/26/15   Historical Provider, MD  aspirin 81 MG tablet Take 81 mg by mouth daily.    Historical Provider, MD  benzonatate (TESSALON) 100 MG capsule TAKE 1 CAPSULE (100 MG TOTAL) BY MOUTH 3 (THREE) TIMES DAILY AS NEEDED FOR COUGH. 04/23/14   Unk Pinto, MD  cefUROXime (CEFTIN) 250 MG tablet Take 1 tablet (250 mg total) by mouth 2 (two) times daily. 06/22/15   Vicie Mutters, PA-C  Cholecalciferol (VITAMIN D-3) 1000 UNITS CAPS Take 5,000 Units by mouth daily.    Historical Provider, MD  citalopram (CELEXA) 40 MG tablet TAKE 1 TABLET BY MOUTH TWICE A  DAY FOR MOOD 05/23/15   Unk Pinto, MD  citalopram (CELEXA) 40 MG tablet  08/09/14   Historical Provider, MD  colchicine 0.6 MG tablet Take 1 tablet (0.6 mg total) by mouth daily. 03/09/15 03/08/16  Unk Pinto, MD  cyclobenzaprine (FLEXERIL) 10 MG tablet Take 1 tablet (10 mg total) by mouth 3 (three) times daily as needed for muscle spasms. 11/17/14   Courtney Forcucci, PA-C  doxycycline (VIBRAMYCIN) 100 MG capsule Take 1 capsule (100 mg total) by mouth 2 (two) times daily. 07/26/15   Shary Decamp, PA-C  ferrous sulfate 325 (65 FE) MG tablet TAKE 1 TABLET TWICE DAILY Patient taking differently: TAKE 1 TABLET ONCE DAILY 01/19/13   Inda Castle, MD  fluconazole (DIFLUCAN) 150 MG tablet Take 1 tablet (150 mg total) by mouth once. 05/04/15   Vicie Mutters, PA-C  furosemide (LASIX) 40 MG tablet Take 3 tablets (120 mg total) by mouth 2 (two) times daily. 07/26/15   Shary Decamp, PA-C  gabapentin (NEURONTIN) 300 MG capsule take 1 capsule by mouth in the morning, 1 capsule in the afternoon and 3 capsules at night 12/13/14   Historical Provider, MD  guaiFENesin (ROBITUSSIN) 100 MG/5ML liquid Take 5-10 mLs (100-200 mg total) by mouth every 4 (four) hours as needed for cough. 07/26/15   Shary Decamp, PA-C  ipratropium (ATROVENT) 0.02 % nebulizer solution use 1 vial in  nebulizer EVERY 4 HOURS AS NEEDED FOR WHEEZING OR SHORTNESS OF BREATH 06/15/15   Vicie Mutters, PA-C  IRON PO Take 325 mg by mouth.    Historical Provider, MD  levofloxacin (LEVAQUIN) 500 MG tablet Take 1 tablet (500 mg total) by mouth daily. 07/26/15   Shary Decamp, PA-C  Magnesium 250 MG TABS Take 250 mg by mouth daily at 6 (six) AM.    Historical Provider, MD  metFORMIN (GLUCOPHAGE-XR) 500 MG 24 hr tablet TAKE 2 TABLETS BY MOUTH TWICE DAILY AFTER MEALS 12/21/14   Unk Pinto, MD  nadolol (CORGARD) 40 MG tablet Take 1 tablet (40 mg total) by mouth daily. 07/11/15   Unk Pinto, MD  Omega-3 Fatty Acids (FISH OIL) 1000 MG CAPS Take 1,000 mg by mouth 2 (two) times daily.    Historical Provider, MD  OVER THE COUNTER MEDICATION Take 1-2 tablets by mouth as needed (constipation). Natural Veg Laxative    Historical Provider, MD  OXYGEN Inhale 3 L/min  into the lungs continuous as needed. On exertion and at home    Historical Provider, MD  pantoprazole (PROTONIX) 40 MG tablet Take 1 tablet (40 mg total) by mouth 2 (two) times daily. 12/11/14   Unk Pinto, MD  predniSONE (DELTASONE) 10 MG tablet Take 6 tablets (60 mg total) by mouth daily. 07/26/15   Shary Decamp, PA-C  spironolactone (ALDACTONE) 25 MG tablet Take 1 tablet (25 mg total) by mouth 3 times a day. For fluids and swelling 06/27/15   Unk Pinto, MD  tamoxifen (NOLVADEX) 20 MG tablet Take 1 tablet (20 mg total) by mouth daily. 04/26/15   Courtney Forcucci, PA-C  terconazole (TERAZOL 7) 0.4 % vaginal cream Place 1 applicator vaginally at bedtime. 05/04/15   Vicie Mutters, PA-C  triamcinolone cream (KENALOG) 0.1 % Apply 1 application topically 3 (three) times daily. 05/16/15   Courtney Forcucci, PA-C   BP 115/69 mmHg  Pulse 62  Temp(Src) 97.9 F (36.6 C) (Oral)  Resp 18  Ht 5' 2.5" (1.588 m)  Wt 81.647 kg  BMI 32.38 kg/m2  SpO2 97%   Physical Exam  Constitutional: She is oriented to person, place, and time. She appears  well-developed and well-nourished.  HENT:  Head: Normocephalic and atraumatic.  Eyes: Pupils are equal, round, and reactive to light.  Neck: Neck supple.  Cardiovascular: Normal rate and regular rhythm.   Pulmonary/Chest: Effort normal.  Abdominal: She exhibits distension. She exhibits no ascites. There is generalized tenderness.  Musculoskeletal:       Legs: Neurological: She is alert and oriented to person, place, and time. She has normal strength. No cranial nerve deficit or sensory deficit.  No Asterixis. No AMS  Skin: Skin is warm and dry. Rash (Right leg (anterior Tibia)) noted.  Psychiatric: She has a normal mood and affect. Her behavior is normal. Thought content normal.    ED Course  Procedures (including critical care time) Labs Review Labs Reviewed  BASIC METABOLIC PANEL - Abnormal; Notable for the following:    Chloride 98 (*)    Glucose, Bld 192 (*)    Creatinine, Ser 1.04 (*)    GFR calc non Af Amer 54 (*)    Anion gap 16 (*)    All other components within normal limits  CBC - Abnormal; Notable for the following:    RBC 3.51 (*)    Hemoglobin 11.4 (*)    HCT 35.1 (*)    Platelets 93 (*)    All other components within normal limits  BRAIN NATRIURETIC PEPTIDE  I-STAT TROPOININ, ED   Imaging Review Dg Chest 2 View  07/26/2015  CLINICAL DATA:  Shortness of Breath EXAM: CHEST  2 VIEW COMPARISON:  May 09, 2015 FINDINGS: There is no edema or consolidation. The heart size and pulmonary vascularity are normal. No adenopathy. There is evidence of a total shoulder replacement on the right with remodeling in the proximal right humerus. There is postoperative change in the lower cervical spine region. IMPRESSION: No edema or consolidation. Electronically Signed   By: Lowella Grip III M.D.   On: 07/26/2015 13:47   I have personally reviewed and evaluated these images and lab results as part of my medical decision-making.   EKG Interpretation   Date/Time:  Tuesday  July 26 2015 12:44:05 EST Ventricular Rate:  63 PR Interval:  156 QRS Duration: 128 QT Interval:  450 QTC Calculation: 460 R Axis:   62 Text Interpretation:  Normal sinus rhythm Right bundle branch block  Abnormal ECG since  last tracing no significant change Confirmed by MILLER   MD, [place] ([id number]) on 07/26/2015 1:36:00 PM      MDM  I have reviewed relevant laboratory values. I have reviewed relevant imaging studies. I have reviewed the relevant EKG. I have reviewed the relevant previous healthcare records.  I obtained HPI from historian. Cases discussed with Attending Physician  ED Course: U/S of Abdomen performed. No free fluid noted on FAST exam suggestive of Ascites.  Given Rocephin 1g for Cellulitis of R leg  Consult to medicine for possible admission  Assessment: 4y F w/ mult comorbidity and hx cirrhosis. BMP, Trop, EKG unremarkable. U/S did not show free fluid consistent with Ascites. No indication for paracentesis. Suspicion for PE low due to BLE edema. Consulted with medicine due to failed outpatient therapy.  Consult (Dr. Waldemar Dickens) recommended:    - Decadron 8m IV x1 followed by Prednisone 628mx5 days - Duoneb followed by albuterol Q4 x24 hrs - Mucinex - Doxycycline - Levaquin - Lasix IV 80 in ED; Iincreased home lasix to 120 BID - f/u PCP in 1-2 days. Pt to call PCP on 07/27/15 for appt.  U/S did not show indication of Ascites or increased free fluid.   Disposition/Plan:  D/c home with following medications (see above) Additional Verbal discharge instructions given and discussed with patient.  Pt Instructed to f/u with PCP in the next 48 hours for evaluation and treatment of symptoms.  Patient was discussed with BrNoemi ChapelMD    Final diagnoses:  Cellulitis of right lower extremity  Dyspnea  Cirrhosis of liver without ascites, unspecified hepatic cirrhosis type ([facility] ([facility]     TyShary DecampPA-C 07/26/15 1856  BrNoemi ChapelMD 07/27/15 [id number]

## 2015-07-26 NOTE — Progress Notes (Signed)
Subjective:    Patient ID: [name], female    DOB: Sep 29, 1945, 69 y.o.   MRN: [id number]  HPI 69 y.o. WF with complicated history of COPD on 3 L O2, cirrhosis presents for follow up. Last visit she likely had CHF/cor pulmonale with orthopnea, AB distention, 10 lbs weight gain, and SOB. Her lasix was increase from 50m BID to 823mBID, she was suppose to fluid restrict and continue spirolactone 2567mID.   States right anterior leg with redness, swelling, weeping, warmth x Saturday. She has had worsening AB distention, worsening SOB, active wheezing, nausea, and dizziness and myoclonic jerks this past weekend. Her weight is down 3 lbs only. She had normal AB [place] Oct without ascites.   Wt Readings from Last 3 Encounters:  07/26/15 179 lb (81.194 kg)  07/22/15 182 lb (82.555 kg)  05/25/15 172 lb (78.019 kg)    Blood pressure 130/70, pulse 74, temperature 98.6 F (37 C), temperature source Temporal, resp. rate 14, height 5' 2"  (1.575 m), weight 179 lb (81.194 kg), SpO2 98 %.  Current Outpatient Prescriptions on File Prior to Visit  Medication Sig Dispense Refill  . acetaminophen-codeine (TYLENOL #3) 300-30 MG tablet TAKE ONE-HALF TO ONE TABLET BY MOUTH FOUR TIMES DAILY AS NEEDED 120 tablet 3  . allopurinol (ZYLOPRIM) 300 MG tablet 1/2-1 tablet daily for gout 90 tablet 1  . amitriptyline (ELAVIL) 10 MG tablet TAKE 1 TABLET BY MOUTH 3 TIMES A DAY AS NEEDED 90 tablet 2  . amitriptyline (ELAVIL) 10 MG tablet Take 1 tablet (10 mg total) by mouth 3 (three) times daily as needed. 270 tablet 1  . amoxicillin (AMOXIL) 500 MG capsule TAKE 4 TABLETS BY MOUTH 1-2 HOURS PRIOR TO DENTAL WORK  0  . aspirin 81 MG tablet Take 81 mg by mouth daily.    . benzonatate (TESSALON) 100 MG capsule TAKE 1 CAPSULE (100 MG TOTAL) BY MOUTH 3 (THREE) TIMES DAILY AS NEEDED FOR COUGH. 100 capsule 1  . cefUROXime (CEFTIN) 250 MG tablet Take 1 tablet (250 mg total) by mouth 2 (two) times daily. 20 tablet 0  .  Cholecalciferol (VITAMIN D-3) 1000 UNITS CAPS Take 5,000 Units by mouth daily.    . citalopram (CELEXA) 40 MG tablet TAKE 1 TABLET BY MOUTH TWICE A  DAY FOR MOOD 60 tablet 3  . citalopram (CELEXA) 40 MG tablet     . colchicine 0.6 MG tablet Take 1 tablet (0.6 mg total) by mouth daily. 30 tablet 2  . cyclobenzaprine (FLEXERIL) 10 MG tablet Take 1 tablet (10 mg total) by mouth 3 (three) times daily as needed for muscle spasms. 30 tablet 0  . ferrous sulfate 325 (65 FE) MG tablet TAKE 1 TABLET TWICE DAILY (Patient taking differently: TAKE 1 TABLET ONCE DAILY) 60 tablet 5  . fluconazole (DIFLUCAN) 150 MG tablet Take 1 tablet (150 mg total) by mouth once. 1 tablet 3  . furosemide (LASIX) 80 MG tablet Take 1 tablet (80 mg total) by mouth 2 (two) times daily. 180 tablet 3  . gabapentin (NEURONTIN) 300 MG capsule take 1 capsule by mouth in the morning, 1 capsule in the afternoon and 3 capsules at night  2  . ipratropium (ATROVENT) 0.02 % nebulizer solution use 1 vial in nebulizer EVERY 4 HOURS AS NEEDED FOR WHEEZING OR SHORTNESS OF BREATH 75 mL 2  . IRON PO Take 325 mg by mouth.    . Magnesium 250 MG TABS Take 250 mg by mouth daily  at 6 (six) AM.    . metFORMIN (GLUCOPHAGE-XR) 500 MG 24 hr tablet TAKE 2 TABLETS BY MOUTH TWICE DAILY AFTER MEALS 360 tablet 3  . nadolol (CORGARD) 40 MG tablet Take 1 tablet (40 mg total) by mouth daily. 90 tablet 1  . Omega-3 Fatty Acids (FISH OIL) 1000 MG CAPS Take 1,000 mg by mouth 2 (two) times daily.    Marland Kitchen OVER THE COUNTER MEDICATION Take 1-2 tablets by mouth as needed (constipation). Natural Veg Laxative    . OXYGEN Inhale 3 L/min into the lungs continuous as needed. On exertion and at home    . pantoprazole (PROTONIX) 40 MG tablet Take 1 tablet (40 mg total) by mouth 2 (two) times daily. 180 tablet 1  . predniSONE (DELTASONE) 20 MG tablet Take 1/2 tablet to 1 tablet every other day until next office visit. 30 tablet 3  . spironolactone (ALDACTONE) 25 MG tablet Take 1  tablet (25 mg total) by mouth 3 times a day. For fluids and swelling 90 tablet 1  . tamoxifen (NOLVADEX) 20 MG tablet Take 1 tablet (20 mg total) by mouth daily. 90 tablet 2  . terconazole (TERAZOL 7) 0.4 % vaginal cream Place 1 applicator vaginally at bedtime. 45 g 2  . triamcinolone cream (KENALOG) 0.1 % Apply 1 application topically 3 (three) times daily. 30 g 0   No current facility-administered medications on file prior to visit.   Past Medical History  Diagnosis Date  . Hx of shoulder replacement   . Hypertension   . Hyperlipidemia   . COPD (chronic obstructive pulmonary disease) ([place])   . Vitamin D deficiency   . IBS (irritable bowel syndrome)   . Fibromyalgia     Review of Systems  Constitutional: Negative for fever, chills and diaphoresis.  HENT: Negative for congestion, ear discharge, ear pain, hearing loss, nosebleeds, sore throat and tinnitus.   Eyes: Negative.   Respiratory: Positive for cough and shortness of breath. Negative for wheezing and stridor.   Cardiovascular: Positive for chest pain and leg swelling. Negative for palpitations.  Gastrointestinal: Positive for abdominal pain (RUQ, and states seems more distended than usual). Negative for nausea, vomiting, diarrhea, constipation and blood in stool.  Genitourinary: Negative.   Musculoskeletal: Positive for myalgias, back pain and arthralgias. Negative for neck stiffness.  Skin: Positive for rash and wound (right anterior shin). Negative for color change and pallor.  Neurological: Positive for dizziness (better since decreased gapapentin). Negative for tremors, seizures, weakness and headaches.  Psychiatric/Behavioral: Negative.        Objective:   Physical Exam  Constitutional: She is oriented to person, place, and time. She appears well-developed. No distress.  HENT:  Head: Normocephalic and atraumatic.  Mouth/Throat: Oropharynx is clear and moist. No oropharyngeal exudate.  Eyes: Conjunctivae are normal.  No scleral icterus.  Neck: Normal range of motion. Neck supple. No JVD present. No thyromegaly present.  Cardiovascular: Normal rate, regular rhythm and intact distal pulses.  Exam reveals no gallop and no friction rub.   Murmur heard. Pulses:      Dorsalis pedis pulses are 1+ on the right side, and 1+ on the left side.       Posterior tibial pulses are 1+ on the right side, and 1+ on the left side.  Pulmonary/Chest: Effort normal. No respiratory distress. She has wheezes (with decreased breath sounds diffusely, on 3 L O2). She has no rales. She exhibits no tenderness.  Distant breath sounds.  Patient is on [place] O2  Abdominal: Soft. Bowel sounds are normal. She exhibits distension (with possible fluid shift). She exhibits no mass. There is tenderness. There is no rebound and no guarding.  Musculoskeletal: Normal range of motion. She exhibits edema (2+ bilateral legs).  Patient in wheelchair from weakness  Lymphadenopathy:    She has no cervical adenopathy.  Neurological: She is alert and oriented to person, place, and time. No cranial nerve deficit.  Skin: Skin is warm and dry. She is not diaphoretic.  Anterior right leg with scabs, 5x7 cm area of erythema,warmth, weeping and tenderness.   Psychiatric: She has a normal mood and affect. Her behavior is normal.  Nursing note and vitals reviewed.      Assessment & Plan:  Cellulitis right leg-  With decreased pulses, will need ABX, since going to the hospital will defer  CHF/corpulmonale with questionable ascites, failed outpatient treatment with lasix -patient with cirrhosis, severe COPD, possible cor pulmonale with worsening SOB despite oral lasix over the weekend suggest patient go to the ER for possible admission and controlled diuresis with close monitoring with possible paracentesis if needed. Will be taken by private vehicle by her husband.

## 2015-07-26 NOTE — ED Notes (Signed)
Pt sts increased SOB and swelling in legs and abd; pt on 3L O2

## 2015-07-27 ENCOUNTER — Other Ambulatory Visit: Payer: Self-pay | Admitting: Physician Assistant

## 2015-07-27 MED ORDER — GABAPENTIN 300 MG PO CAPS: ORAL_CAPSULE | ORAL | Status: DC

## 2015-07-28 ENCOUNTER — Encounter: Payer: Self-pay | Admitting: Physician Assistant

## 2015-07-28 ENCOUNTER — Ambulatory Visit (INDEPENDENT_AMBULATORY_CARE_PROVIDER_SITE_OTHER): Payer: PPO | Admitting: Physician Assistant

## 2015-07-28 VITALS — BP 124/60 | HR 88 | Temp 98.2°F | Resp 14 | Ht 62.0 in | Wt 180.0 lb

## 2015-07-28 DIAGNOSIS — F411 Generalized anxiety disorder: Secondary | ICD-10-CM | POA: Diagnosis not present

## 2015-07-28 DIAGNOSIS — I509 Heart failure, unspecified: Secondary | ICD-10-CM

## 2015-07-28 DIAGNOSIS — Z79899 Other long term (current) drug therapy: Secondary | ICD-10-CM

## 2015-07-28 DIAGNOSIS — R350 Frequency of micturition: Secondary | ICD-10-CM

## 2015-07-28 LAB — BASIC METABOLIC PANEL WITH GFR
BUN: 31 mg/dL — ABNORMAL HIGH (ref 7–25)
CHLORIDE: 99 mmol/L (ref 98–110)
CO2: 27 mmol/L (ref 20–31)
Calcium: 8.9 mg/dL (ref 8.6–10.4)
Creat: 1.18 mg/dL — ABNORMAL HIGH (ref 0.50–0.99)
GFR, EST NON AFRICAN AMERICAN: 48 mL/min — AB (ref >=60)
GFR, Est African American: 55 mL/min — ABNORMAL LOW (ref >=60)
Glucose, Bld: 253 mg/dL — ABNORMAL HIGH (ref 65–99)
POTASSIUM: 3.9 mmol/L (ref 3.5–5.3)
SODIUM: 141 mmol/L (ref 135–146)

## 2015-07-28 LAB — CBC WITH DIFFERENTIAL/PLATELET
BASOS PCT: 0 % (ref 0–1)
Basophils Absolute: 0 10*3/uL (ref 0.0–0.1)
Eosinophils Absolute: 0 10*3/uL (ref 0.0–0.7)
Eosinophils Relative: 0 % (ref 0–5)
HEMATOCRIT: 33.3 % — AB (ref 36.0–46.0)
HEMOGLOBIN: 11 g/dL — AB (ref 12.0–15.0)
LYMPHS PCT: 15 % (ref 12–46)
Lymphs Abs: 1.4 10*3/uL (ref 0.7–4.0)
MCH: 32.4 pg (ref 26.0–34.0)
MCHC: 33 g/dL (ref 30.0–36.0)
MCV: 97.9 fL (ref 78.0–100.0)
MPV: 12 fL (ref 8.6–12.4)
Monocytes Absolute: 0.5 10*3/uL (ref 0.1–1.0)
Monocytes Relative: 5 % (ref 3–12)
NEUTROS ABS: 7.4 10*3/uL (ref 1.7–7.7)
NEUTROS PCT: 80 % — AB (ref 43–77)
Platelets: 128 10*3/uL — ABNORMAL LOW (ref 150–400)
RBC: 3.4 MIL/uL — AB (ref 3.87–5.11)
RDW: 16.4 % — ABNORMAL HIGH (ref 11.5–15.5)
WBC: 9.2 10*3/uL (ref 4.0–10.5)

## 2015-07-28 LAB — HEPATIC FUNCTION PANEL
ALK PHOS: 113 U/L (ref 33–130)
ALT: 31 U/L — AB (ref 6–29)
AST: 36 U/L — AB (ref 10–35)
Albumin: 3.7 g/dL (ref 3.6–5.1)
BILIRUBIN DIRECT: 0.1 mg/dL (ref <=0.2)
BILIRUBIN INDIRECT: 0.4 mg/dL (ref 0.2–1.2)
Total Bilirubin: 0.5 mg/dL (ref 0.2–1.2)
Total Protein: 5.8 g/dL — ABNORMAL LOW (ref 6.1–8.1)

## 2015-07-28 MED ORDER — MUPIROCIN CALCIUM 2 % EX CREA: 1.0000 "application " | TOPICAL_CREAM | Freq: Three times a day (TID) | CUTANEOUS | Status: DC

## 2015-07-28 MED ORDER — ALPRAZOLAM 0.5 MG PO TABS: ORAL_TABLET | ORAL | Status: DC

## 2015-07-28 NOTE — Patient Instructions (Addendum)
Do the following things EVERYDAY: 1) Weigh yourself in the morning before breakfast. Write it down and keep it in a log. 2) Take your medicines as prescribed 3) Eat low salt foods-Limit salt (sodium) to 2000 mg per day. Best thing to do is avoid processed foods.   4) Stay as active as you can everyday 5) Limit all fluids for the day to less than 2.5 liters  If your weight goes down to by more than 5 lbs in a day you can go back to 81m BID.    Call your doctor if:  Anytime you have any of the following symptoms:  1) 3 pound weight gain in 24 hours or 5 pounds in 1 week  2) shortness of breath, with or without a dry hacking cough  3) swelling in the hands, feet or stomach  4) if you have to sleep on extra pillows at night in order to breathe. 5) after laying down at night for 20-30 mins, you wake up short of breath.   These can all be signs of fluid overload.

## 2015-07-28 NOTE — Progress Notes (Signed)
Assessment and Plan: SOB- multifactoral- suggest sending to cardio for echocardiogram, likely cor pulomarle but rule out systolic heart failure- continue lasix 160m BID with close follow up Anxiety- xanax PRN  HPI 69y.o.female presents for follow up from the hospital, she had BNP 58, torponin negative, normal EKG. [place], she was treated for COPD exacerbation and possible CHF with increase in her lasix to 1275mBID. Her husband states that she is 69 at home, she is here in the office at 69.  She continues to have shortness of breath, 15 lbs from dry weight. She is feeling better with her breathing x 1 day.  Patient states she feels very anxious and would like something for anxiety. Denies CP.   Wt Readings from Last 8 Encounters:  07/28/15 180 lb (81.647 kg)  07/26/15 180 lb (81.647 kg)  07/26/15 179 lb (81.194 kg)  07/22/15 182 lb (82.555 kg)  05/25/15 172 lb (78.019 kg)  05/11/15 172 lb (78.019 kg)  05/04/15 175 lb (79.379 kg)  04/15/15 166 lb (75.297 kg)     Past Medical History  Diagnosis Date  . Hx of shoulder replacement   . Hypertension   . Hyperlipidemia   . [place]  . Vitamin D deficiency   . IBS (irritable bowel syndrome)   . Fibromyalgia   . Hepatic cirrhosis ([facility])      Allergies  Allergen Reactions  . Atorvastatin     Other reaction(s): Other (See Comments) Doesn't remember  . Diphenhydramine Hcl   . Diphenhydramine Hcl (Sleep) Hives  . Gemfibrozil     Other reaction(s): Other (See Comments) Doesn't remember  . Hydrocodone-Acetaminophen   . Hydrocodone-Acetaminophen     Other reaction(s): Other (See Comments) Doesn't remember  . Loratadine Hives  . Loratadine-Pseudoephedrine Er   . Lorazepam Hives  . Simvastatin   . Sulfamethoxazole Hives  . Sulfonamide Derivatives       Current Outpatient Prescriptions on File Prior to Visit  Medication Sig Dispense Refill  .  acetaminophen-codeine (TYLENOL #3) 300-30 MG tablet TAKE ONE-HALF TO ONE TABLET BY MOUTH FOUR TIMES DAILY AS NEEDED 120 tablet 3  . allopurinol (ZYLOPRIM) 300 MG tablet 1/2-1 tablet daily for gout 90 tablet 1  . amitriptyline (ELAVIL) 10 MG tablet TAKE 1 TABLET BY MOUTH 3 TIMES A DAY AS NEEDED 90 tablet 2  . amitriptyline (ELAVIL) 10 MG tablet Take 1 tablet (10 mg total) by mouth 3 (three) times daily as needed. 270 tablet 1  . amoxicillin (AMOXIL) 500 MG capsule TAKE 4 TABLETS BY MOUTH 1-2 HOURS PRIOR TO DENTAL WORK  0  . aspirin 81 MG tablet Take 81 mg by mouth daily.    . benzonatate (TESSALON) 100 MG capsule TAKE 1 CAPSULE (100 MG TOTAL) BY MOUTH 3 (THREE) TIMES DAILY AS NEEDED FOR COUGH. 100 capsule 1  . cefUROXime (CEFTIN) 250 MG tablet Take 1 tablet (250 mg total) by mouth 2 (two) times daily. 20 tablet 0  . Cholecalciferol (VITAMIN D-3) 1000 UNITS CAPS Take 5,000 Units by mouth daily.    . citalopram (CELEXA) 40 MG tablet TAKE 1 TABLET BY MOUTH TWICE A  DAY FOR MOOD 60 tablet 3  . citalopram (CELEXA) 40 MG tablet     . colchicine 0.6 MG tablet Take 1 tablet (0.6 mg total) by mouth daily. 30 tablet 2  . cyclobenzaprine (FLEXERIL) 10 MG tablet Take 1 tablet (10 mg total) by mouth 3 (three) times  daily as needed for muscle spasms. 30 tablet 0  . doxycycline (VIBRAMYCIN) 100 MG capsule Take 1 capsule (100 mg total) by mouth 2 (two) times daily. 20 capsule 0  . ferrous sulfate 325 (65 FE) MG tablet TAKE 1 TABLET TWICE DAILY (Patient taking differently: TAKE 1 TABLET ONCE DAILY) 60 tablet 5  . fluconazole (DIFLUCAN) 150 MG tablet Take 1 tablet (150 mg total) by mouth once. 1 tablet 3  . furosemide (LASIX) 40 MG tablet Take 3 tablets (120 mg total) by mouth 2 (two) times daily. 60 tablet 0  . gabapentin (NEURONTIN) 300 MG capsule take 1 capsule by mouth in the morning, 1 capsule in the afternoon and 1-3 capsules at night 120 capsule 2  . guaiFENesin (ROBITUSSIN) 100 MG/5ML liquid Take 5-10 mLs  (100-200 mg total) by mouth every 4 (four) hours as needed for cough. 60 mL 0  . ipratropium (ATROVENT) 0.02 % nebulizer solution use 1 vial in nebulizer EVERY 4 HOURS AS NEEDED FOR WHEEZING OR SHORTNESS OF BREATH 75 mL 2  . IRON PO Take 325 mg by mouth.    . levofloxacin (LEVAQUIN) 500 MG tablet Take 1 tablet (500 mg total) by mouth daily. 7 tablet 0  . Magnesium 250 MG TABS Take 250 mg by mouth daily at 6 (six) AM.    . metFORMIN (GLUCOPHAGE-XR) 500 MG 24 hr tablet TAKE 2 TABLETS BY MOUTH TWICE DAILY AFTER MEALS 360 tablet 3  . nadolol (CORGARD) 40 MG tablet Take 1 tablet (40 mg total) by mouth daily. 90 tablet 1  . Omega-3 Fatty Acids (FISH OIL) 1000 MG CAPS Take 1,000 mg by mouth 2 (two) times daily.    Marland Kitchen OVER THE COUNTER MEDICATION Take 1-2 tablets by mouth as needed (constipation). Natural Veg Laxative    . OXYGEN Inhale 3 L/min into the lungs continuous as needed. On exertion and at home    . pantoprazole (PROTONIX) 40 MG tablet Take 1 tablet (40 mg total) by mouth 2 (two) times daily. 180 tablet 1  . predniSONE (DELTASONE) 10 MG tablet Take 6 tablets (60 mg total) by mouth daily. 30 tablet 0  . spironolactone (ALDACTONE) 25 MG tablet Take 1 tablet (25 mg total) by mouth 3 times a day. For fluids and swelling 90 tablet 1  . tamoxifen (NOLVADEX) 20 MG tablet Take 1 tablet (20 mg total) by mouth daily. 90 tablet 2  . terconazole (TERAZOL 7) 0.4 % vaginal cream Place 1 applicator vaginally at bedtime. 45 g 2  . triamcinolone cream (KENALOG) 0.1 % Apply 1 application topically 3 (three) times daily. 30 g 0   No current facility-administered medications on file prior to visit.    ROS: all negative except above.   Physical Exam: Filed Weights   07/28/15 1116  Weight: 180 lb (81.647 kg)   BP 124/60 mmHg  Pulse 88  Temp(Src) 98.2 F (36.8 C) (Temporal)  Resp 14  Ht 5' 2"  (1.575 m)  Wt 180 lb (81.647 kg)  BMI 32.91 kg/m2  SpO2 95% Constitutional: She is oriented to person, place,  and time. She appears well-developed. No distress.  HENT:  Head: Normocephalic and atraumatic.  Mouth/Throat: Oropharynx is clear and moist. No oropharyngeal exudate.  Eyes: Conjunctivae are normal. No scleral icterus.  Neck: Normal range of motion. Neck supple. No JVD present. No thyromegaly present.  Cardiovascular: Normal rate, regular rhythm and intact distal pulses.  Exam reveals no gallop and no friction rub.   Murmur heard. Pulses:  Dorsalis pedis pulses are 1+ on the right side, and 1+ on the left side.       Posterior tibial pulses are 1+ on the right side, and 1+ on the left side.  Pulmonary/Chest: Effort normal. No respiratory distress. She has decreased breath sounds diffusely, on 3 L O2. She has no rales. She exhibits no tenderness.  Distant breath sounds.  Patient is on [place] O2  Abdominal: Soft. Bowel sounds are normal.  She exhibits no mass. There is tenderness mild to palpation left upper quadrant. There is no rebound and no guarding.  Musculoskeletal: Normal range of motion. She exhibits edema (2+ bilateral legs).  Patient in wheelchair Lymphadenopathy:    She has no cervical adenopathy.  Neurological: She is alert and oriented to person, place, and time. No cranial nerve deficit.  Skin: Skin is warm and dry. She is not diaphoretic.  Anterior right leg with scabs, 2x4 cm area of erythema improving Psychiatric: She has a normal mood and affect. Her behavior is normal.  Nursing note and vitals reviewed.    Vicie Mutters, PA-C 11:30 AM [facility]

## 2015-07-29 LAB — URINALYSIS, ROUTINE W REFLEX MICROSCOPIC
Bilirubin Urine: NEGATIVE
GLUCOSE, UA: NEGATIVE
Hgb urine dipstick: NEGATIVE
KETONES UR: NEGATIVE
NITRITE: NEGATIVE
PH: 6 (ref 5.0–8.0)
Protein, ur: NEGATIVE
SPECIFIC GRAVITY, URINE: 1.014 (ref 1.001–1.035)

## 2015-07-29 LAB — URINE CULTURE
Colony Count: NO GROWTH
ORGANISM ID, BACTERIA: NO GROWTH

## 2015-07-29 LAB — URINALYSIS, MICROSCOPIC ONLY
BACTERIA UA: NONE SEEN [HPF]
Casts: NONE SEEN [LPF]
Crystals: NONE SEEN [HPF]
RBC / HPF: NONE SEEN RBC/HPF (ref <=2)
Yeast: NONE SEEN [HPF]

## 2015-08-02 ENCOUNTER — Encounter: Payer: Self-pay | Admitting: Physician Assistant

## 2015-08-02 ENCOUNTER — Ambulatory Visit (INDEPENDENT_AMBULATORY_CARE_PROVIDER_SITE_OTHER): Payer: PPO | Admitting: Physician Assistant

## 2015-08-02 VITALS — BP 110/60 | HR 70 | Temp 98.1°F | Resp 16 | Ht 62.0 in | Wt 179.0 lb

## 2015-08-02 DIAGNOSIS — I509 Heart failure, unspecified: Secondary | ICD-10-CM | POA: Diagnosis not present

## 2015-08-02 DIAGNOSIS — J42 Unspecified chronic bronchitis: Secondary | ICD-10-CM | POA: Diagnosis not present

## 2015-08-02 DIAGNOSIS — B3781 Candidal esophagitis: Secondary | ICD-10-CM

## 2015-08-02 DIAGNOSIS — Z79899 Other long term (current) drug therapy: Secondary | ICD-10-CM | POA: Diagnosis not present

## 2015-08-02 DIAGNOSIS — K7469 Other cirrhosis of liver: Secondary | ICD-10-CM | POA: Diagnosis not present

## 2015-08-02 MED ORDER — NYSTATIN 100000 UNIT/ML MT SUSP: 5.0000 mL | Freq: Four times a day (QID) | OROMUCOSAL | Status: DC

## 2015-08-02 MED ORDER — METOLAZONE 5 MG PO TABS: ORAL_TABLET | ORAL | Status: DC

## 2015-08-02 MED ORDER — FLUCONAZOLE 100 MG PO TABS: 100.0000 mg | ORAL_TABLET | Freq: Every day | ORAL | Status: AC

## 2015-08-02 NOTE — Patient Instructions (Signed)
Continue on the 113m of lasix or 3 lasix in the morning and 3 at night Add on zaroxylyn 534mwith the lasix in the morning for 3 days and call the office with her weight Then do 1 pill of the zaroxyln every other day until you return to the office  Thrush, Adult Thrush, also called oral candidiasis, is a fungal infection that develops in the mouth and throat and on the tongue. It causes white patches to form on the mouth and tongue. ThRitta Slots most common in older adults, but it can occur at any age.  Many cases of thrush are mild, but this infection can also be more serious. ThRitta Slotan be a recurring problem for people who have chronic illnesses or who take medicines that limit the body's ability to fight infection. Because these people have difficulty fighting infections, the fungus that causes thrush can spread throughout the body. This can cause life-threatening blood or organ infections. CAUSES  ThRitta Slots usually caused by a yeast called Candida albicans. This fungus is normally present in small amounts in the mouth and on other mucous membranes. It usually causes no harm. However, when conditions are present that allow the fungus to grow uncontrolled, it invades surrounding tissues and becomes an infection. Less often, other Candida species can also lead to thrush.  RISK FACTORS ThRitta Slots more likely to develop in the following people:  People with an impaired ability to fight infection (weakened immune system).   Older adults.   People with HIV.   People with diabetes.   People with dry mouth (xerostomia).   Pregnant women.   People with poor dental care, especially those who have false teeth.   People who use antibiotic medicines.  SIGNS AND SYMPTOMS  ThRitta Slotan be a mild infection that causes no symptoms. If symptoms develop, they may include:   A burning feeling in the mouth and throat. This can occur at the start of a thrush infection.   White patches that adhere to  the mouth and tongue. The tissue around the patches may be red, raw, and painful. If rubbed (during tooth brushing, for example), the patches and the tissue of the mouth may bleed easily.   A bad taste in the mouth or difficulty tasting foods.   Cottony feeling in the mouth.   Pain during eating and swallowing. DIAGNOSIS  Your health care provider can usually diagnose thrush by looking in your mouth and asking you questions about your health.  TREATMENT  Medicines that help prevent the growth of fungi (antifungals) are the standard treatment for thrush. These medicines are either applied directly to the affected area (topical) or swallowed (oral). The treatment will depend on the severity of the condition.  Mild Thrush Mild cases of thrush may clear up with the use of an antifungal mouth rinse or lozenges. Treatment usually lasts about 14 days.  Moderate to Severe Thrush  More severe thrush infections that have spread to the esophagus are treated with an oral antifungal medicine. A topical antifungal medicine may also be used.   For some severe infections, a treatment period longer than 14 days may be needed.   Oral antifungal medicines are almost never used during pregnancy because the fetus may be harmed. However, if a pregnant woman has a rare, severe thrush infection that has spread to her blood, oral antifungal medicines may be used. In this case, the risk of harm to the mother and fetus from the severe thrush infection may  be greater than the risk posed by the use of antifungal medicines.  Persistent or Recurrent Thrush For cases of thrush that do not go away or keep coming back, treatment may involve the following:   Treatment may be needed twice as long as the symptoms last.   Treatment will include both oral and topical antifungal medicines.   People with weakened immune systems can take an antifungal medicine on a continuous basis to prevent thrush infections.  It is  important to treat conditions that make you more likely to get thrush, such as diabetes or HIV.  HOME CARE INSTRUCTIONS   Only take over-the-counter or prescription medicine as directed by your health care provider. Talk to your health care provider about an over-the-counter medicine called gentian violet, which kills bacteria and fungi.   Eat plain, unflavored yogurt as directed by your health care provider. Check the label to make sure the yogurt contains live cultures. This yogurt can help healthy bacteria grow in the mouth that can stop the growth of the fungus that causes thrush.   Try these measures to help reduce the discomfort of thrush:   Drink cold liquids such as water or iced tea.   Try flavored ice treats or frozen juices.   Eat foods that are easy to swallow, such as gelatin, ice cream, or custard.   If the patches in your mouth are painful, try drinking from a straw.   Rinse your mouth several times a day with a warm saltwater rinse. You can make the saltwater mixture with 1 tsp (6 g) of salt in 8 fl oz (0.2 L) of warm water.   If you wear dentures, remove the dentures before going to bed, brush them vigorously, and soak them in a cleaning solution as directed by your health care provider.   Women who are breastfeeding should clean their nipples with an antifungal medicine as directed by their health care provider. Dry the nipples after breastfeeding. Applying lanolin-containing body lotion may help relieve nipple soreness.  SEEK MEDICAL CARE IF:  Your symptoms are getting worse or are not improving within 7 days of starting treatment.   You have symptoms of spreading infection, such as white patches on the skin outside of the mouth.   You are nursing and you have redness, burning, or pain in the nipples that is not relieved with treatment.  MAKE SURE YOU:  Understand these instructions.  Will watch your condition.  Will get help right away if you are  not doing well or get worse.   This information is not intended to replace advice given to you by your health care provider. Make sure you discuss any questions you have with your health care provider.   Document Released: 04/17/2004 Document Revised: 08/13/2014 Document Reviewed: 02/23/2013 Elsevier Interactive Patient Education 2016 [place] following things EVERYDAY: 1) Weigh yourself in the morning before breakfast. Write it down and keep it in a log. 2) Take your medicines as prescribed 3) Eat low salt foods-Limit salt (sodium) to 2000 mg per day. Best thing to do is avoid processed foods.   4) Stay as active as you can everyday 5) Limit all fluids for the day to less than 2 liters  Call your doctor if:  Anytime you have any of the following symptoms:  1) 3 pound weight gain in 24 hours or 5 pounds in 1 week  2) shortness of breath, with or without a dry hacking cough  3)  swelling in the hands, feet or stomach  4) if you have to sleep on extra pillows at night in order to breathe. 5) after laying down at night for 20-30 mins, you wake up short of breath.   These can all be signs of fluid overload.   .Chronic Obstructive Pulmonary Disease Chronic obstructive pulmonary disease (COPD) is a common lung condition in which airflow from the lungs is limited. COPD is a general term that can be used to describe many different lung problems that limit airflow, including both chronic bronchitis and emphysema. If you have COPD, your lung function will probably never return to normal, but there are measures you can take to improve lung function and make yourself feel better. CAUSES   Smoking (common).  Exposure to secondhand smoke.  Genetic problems.  Chronic inflammatory lung diseases or recurrent infections. SYMPTOMS  Shortness of breath, especially with physical activity.  Deep, persistent (chronic) cough with a large amount of thick mucus.  Wheezing.  Rapid  breaths (tachypnea).  Gray or bluish discoloration (cyanosis) of the skin, especially in your fingers, toes, or lips.  Fatigue.  Weight loss.  Frequent infections or episodes when breathing symptoms become much worse (exacerbations).  Chest tightness. DIAGNOSIS Your health care provider will take a medical history and perform a physical examination to diagnose COPD. Additional tests for COPD may include:  Lung (pulmonary) function tests.  Chest X-ray.  CT scan.  Blood tests. TREATMENT  Treatment for COPD may include:  Inhaler and nebulizer medicines. These help manage the symptoms of COPD and make your breathing more comfortable.  Supplemental oxygen. Supplemental oxygen is only helpful if you have a low oxygen level in your blood.  Exercise and physical activity. These are beneficial for nearly all people with COPD.  Lung surgery or transplant.  Nutrition therapy to gain weight, if you are underweight.  Pulmonary rehabilitation. This may involve working with a team of health care providers and specialists, such as respiratory, occupational, and physical therapists. HOME CARE INSTRUCTIONS  Take all medicines (inhaled or pills) as directed by your health care provider.  Avoid over-the-counter medicines or cough syrups that dry up your airway (such as antihistamines) and slow down the elimination of secretions unless instructed otherwise by your health care provider.  If you are a smoker, the most important thing that you can do is stop smoking. Continuing to smoke will cause further lung damage and breathing trouble. Ask your health care provider for help with quitting smoking. He or she can direct you to community resources or hospitals that provide support.  Avoid exposure to irritants such as smoke, chemicals, and fumes that aggravate your breathing.  Use oxygen therapy and pulmonary rehabilitation if directed by your health care provider. If you require home oxygen  therapy, ask your health care provider whether you should purchase a pulse oximeter to measure your oxygen level at home.  Avoid contact with individuals who have a contagious illness.  Avoid extreme temperature and humidity changes.  Eat healthy foods. Eating smaller, more frequent meals and resting before meals may help you maintain your strength.  Stay active, but balance activity with periods of rest. Exercise and physical activity will help you maintain your ability to do things you want to do.  Preventing infection and hospitalization is very important when you have COPD. Make sure to receive all the vaccines your health care provider recommends, especially the pneumococcal and influenza vaccines. Ask your health care provider whether you need a pneumonia  vaccine.  Learn and use relaxation techniques to manage stress.  Learn and use controlled breathing techniques as directed by your health care provider. Controlled breathing techniques include:  Pursed lip breathing. Start by breathing in (inhaling) through your nose for 1 second. Then, purse your lips as if you were going to whistle and breathe out (exhale) through the pursed lips for 2 seconds.  Diaphragmatic breathing. Start by putting one hand on your abdomen just above your waist. Inhale slowly through your nose. The hand on your abdomen should move out. Then purse your lips and exhale slowly. You should be able to feel the hand on your abdomen moving in as you exhale.  Learn and use controlled coughing to clear mucus from your lungs. Controlled coughing is a series of short, progressive coughs. The steps of controlled coughing are:  Lean your head slightly forward.  Breathe in deeply using diaphragmatic breathing.  Try to hold your breath for 3 seconds.  Keep your mouth slightly open while coughing twice.  Spit any mucus out into a tissue.  Rest and repeat the steps once or twice as needed. SEEK MEDICAL CARE IF:  You  are coughing up more mucus than usual.  There is a change in the color or thickness of your mucus.  Your breathing is more labored than usual.  Your breathing is faster than usual. SEEK IMMEDIATE MEDICAL CARE IF:  You have shortness of breath while you are resting.  You have shortness of breath that prevents you from:  Being able to talk.  Performing your usual physical activities.  You have chest pain lasting longer than 5 minutes.  Your skin color is more cyanotic than usual.  You measure low oxygen saturations for longer than 5 minutes with a pulse oximeter. MAKE SURE YOU:  Understand these instructions.  Will watch your condition.  Will get help right away if you are not doing well or get worse.   This information is not intended to replace advice given to you by your health care provider. Make sure you discuss any questions you have with your health care provider.   Document Released: 05/02/2005 Document Revised: 08/13/2014 Document Reviewed: 03/19/2013 Elsevier Interactive Patient Education [employer].

## 2015-08-02 NOTE — Progress Notes (Signed)
Assessment and Plan: SOB- multifactoral- suggest sending to cardio for possible Echo and to pulmonary for likely cor pulomarle/COPD but rule out systolic heart failure- continue lasix 1103m BID with close follow up and will add on zaroxylyn 586mdaily for 3 days, then every other day- follow up 1 week for kidney check.  Call Friday with weight and report of breathing.  Go to the ER if worsening CP/SOB.  Thrust/esophageal candidiasis diflucan and nystatin sent in  HPI [id number].o.female presents for follow up from fluid overload, was in the ER, she had BNP 58, torponin negative, normal EKG. She had normal AB [place] ascites, she was treated for COPD exacerbation and possible CHF with increase in her lasix to 12070mID. Her husband said that yesterday her weight was down to 174 but today she is back up to 180.   Lab Results  Component Value Date   CREATININE 1.18* 07/28/2015   BUN 31* 07/28/2015   NA 141 07/28/2015   K 3.9 07/28/2015   CL 99 07/28/2015   CO2 27 07/28/2015   She continues to have shortness of breath, 15 lbs from dry weight. She is on O2, and states with her weight up she has had to use continuously.  Patient states she feels that the xanax is helping her AS needed.  Denies CP.  She has been on doxy for COPD exacerbation, now complaining of hoarsness/sore throat and sore mouth.   Wt Readings from Last 8 Encounters:  08/02/15 179 lb (81.194 kg)  07/28/15 180 lb (81.647 kg)  07/26/15 180 lb (81.647 kg)  07/26/15 179 lb (81.194 kg)  07/22/15 182 lb (82.555 kg)  05/25/15 172 lb (78.019 kg)  05/11/15 172 lb (78.019 kg)  05/04/15 175 lb (79.379 kg)     Past Medical History  Diagnosis Date  . Hx of shoulder replacement   . Hypertension   . Hyperlipidemia   . COPD (chronic obstructive pulmonary disease) (HCCAlpha . Vitamin D deficiency   . IBS (irritable bowel syndrome)   . Fibromyalgia   . Hepatic cirrhosis (HCC)      Allergies  Allergen Reactions  . Atorvastatin    Other reaction(s): Other (See Comments) Doesn't remember  . Diphenhydramine Hcl   . Diphenhydramine Hcl (Sleep) Hives  . Gemfibrozil     Other reaction(s): Other (See Comments) Doesn't remember  . Hydrocodone-Acetaminophen   . Hydrocodone-Acetaminophen     Other reaction(s): Other (See Comments) Doesn't remember  . Loratadine Hives  . Loratadine-Pseudoephedrine Er   . Lorazepam Hives  . Simvastatin   . Sulfamethoxazole Hives  . Sulfonamide Derivatives       Current Outpatient Prescriptions on File Prior to Visit  Medication Sig Dispense Refill  . acetaminophen-codeine (TYLENOL #3) 300-30 MG tablet TAKE ONE-HALF TO ONE TABLET BY MOUTH FOUR TIMES DAILY AS NEEDED 120 tablet 3  . allopurinol (ZYLOPRIM) 300 MG tablet 1/2-1 tablet daily for gout 90 tablet 1  . ALPRAZolam (XANAX) 0.5 MG tablet 1/2-1 pil up to 3 times a day for anxiety 60 tablet 0  . amitriptyline (ELAVIL) 10 MG tablet Take 1 tablet (10 mg total) by mouth 3 (three) times daily as needed. 270 tablet 1  . amoxicillin (AMOXIL) 500 MG capsule TAKE 4 TABLETS BY MOUTH 1-2 HOURS PRIOR TO DENTAL WORK  0  . aspirin 81 MG tablet Take 81 mg by mouth daily.    . benzonatate (TESSALON) 100 MG capsule TAKE 1 CAPSULE (100 MG TOTAL) BY MOUTH 3 (THREE)  TIMES DAILY AS NEEDED FOR COUGH. 100 capsule 1  . cefUROXime (CEFTIN) 250 MG tablet Take 1 tablet (250 mg total) by mouth 2 (two) times daily. 20 tablet 0  . Cholecalciferol (VITAMIN D-3) 1000 UNITS CAPS Take 5,000 Units by mouth daily.    . citalopram (CELEXA) 40 MG tablet TAKE 1 TABLET BY MOUTH TWICE A  DAY FOR MOOD 60 tablet 3  . colchicine 0.6 MG tablet Take 1 tablet (0.6 mg total) by mouth daily. 30 tablet 2  . cyclobenzaprine (FLEXERIL) 10 MG tablet Take 1 tablet (10 mg total) by mouth 3 (three) times daily as needed for muscle spasms. 30 tablet 0  . doxycycline (VIBRAMYCIN) 100 MG capsule Take 1 capsule (100 mg total) by mouth 2 (two) times daily. 20 capsule 0  . ferrous sulfate 325  (65 FE) MG tablet TAKE 1 TABLET TWICE DAILY (Patient taking differently: TAKE 1 TABLET ONCE DAILY) 60 tablet 5  . fluconazole (DIFLUCAN) 150 MG tablet Take 1 tablet (150 mg total) by mouth once. 1 tablet 3  . furosemide (LASIX) 40 MG tablet Take 3 tablets (120 mg total) by mouth 2 (two) times daily. 60 tablet 0  . gabapentin (NEURONTIN) 300 MG capsule take 1 capsule by mouth in the morning, 1 capsule in the afternoon and 1-3 capsules at night 120 capsule 2  . guaiFENesin (ROBITUSSIN) 100 MG/5ML liquid Take 5-10 mLs (100-200 mg total) by mouth every 4 (four) hours as needed for cough. 60 mL 0  . ipratropium (ATROVENT) 0.02 % nebulizer solution use 1 vial in nebulizer EVERY 4 HOURS AS NEEDED FOR WHEEZING OR SHORTNESS OF BREATH 75 mL 2  . IRON PO Take 325 mg by mouth.    . levofloxacin (LEVAQUIN) 500 MG tablet Take 1 tablet (500 mg total) by mouth daily. 7 tablet 0  . Magnesium 250 MG TABS Take 250 mg by mouth daily at 6 (six) AM.    . metFORMIN (GLUCOPHAGE-XR) 500 MG 24 hr tablet TAKE 2 TABLETS BY MOUTH TWICE DAILY AFTER MEALS 360 tablet 3  . mupirocin cream (BACTROBAN) 2 % Apply 1 application topically 3 (three) times daily. For 7-14 days 30 g 2  . nadolol (CORGARD) 40 MG tablet Take 1 tablet (40 mg total) by mouth daily. 90 tablet 1  . Omega-3 Fatty Acids (FISH OIL) 1000 MG CAPS Take 1,000 mg by mouth 2 (two) times daily.    Marland Kitchen OVER THE COUNTER MEDICATION Take 1-2 tablets by mouth as needed (constipation). Natural Veg Laxative    . OXYGEN Inhale 3 L/min into the lungs continuous as needed. On exertion and at home    . pantoprazole (PROTONIX) 40 MG tablet Take 1 tablet (40 mg total) by mouth 2 (two) times daily. 180 tablet 1  . predniSONE (DELTASONE) 10 MG tablet Take 6 tablets (60 mg total) by mouth daily. 30 tablet 0  . spironolactone (ALDACTONE) 25 MG tablet Take 1 tablet (25 mg total) by mouth 3 times a day. For fluids and swelling 90 tablet 1  . tamoxifen (NOLVADEX) 20 MG tablet Take 1 tablet  (20 mg total) by mouth daily. 90 tablet 2  . terconazole (TERAZOL 7) 0.4 % vaginal cream Place 1 applicator vaginally at bedtime. 45 g 2  . triamcinolone cream (KENALOG) 0.1 % Apply 1 application topically 3 (three) times daily. 30 g 0   No current facility-administered medications on file prior to visit.    ROS: all negative except above.   Physical Exam: [employer]  08/02/15 1129  Weight: 179 lb (81.194 kg)   BP 110/60 mmHg  Pulse 70  Temp(Src) 98.1 F (36.7 C) (Temporal)  Resp 16  Ht 5' 2"  (1.575 m)  Wt 179 lb (81.194 kg)  BMI 32.73 kg/m2  SpO2 98% Constitutional: She is oriented to person, place, and time. She appears well-developed. No distress.  HENT:  Head: Normocephalic and atraumatic.  Mouth/Throat: + erythema and white scrapable plaques on posterior pharynx. . No oropharyngeal exudate.  Eyes: Conjunctivae are normal. No scleral icterus.  Neck: Normal range of motion. Neck supple. No JVD present. No thyromegaly present.  Cardiovascular: Normal rate, regular rhythm and intact distal pulses.  Exam reveals no gallop and no friction rub.   Murmur heard. Pulses:      Dorsalis pedis pulses are 1+ on the right side, and 1+ on the left side.       Posterior tibial pulses are 1+ on the right side, and 1+ on the left side.  Pulmonary/Chest: Effort normal. No respiratory distress. She has decreased breath sounds diffusely, on 3 L O2 with active wheezing. She has no rales. She exhibits no tenderness.  Distant breath sounds.  Patient is on [place] O2  Abdominal: Soft. Bowel sounds are normal.  She exhibits no mass. There is tenderness mild to palpation left upper quadrant. There is no rebound and no guarding.  Musculoskeletal: Normal range of motion. She exhibits edema (2+ bilateral legs).  Patient in wheelchair Lymphadenopathy:    She has no cervical adenopathy.  Neurological: She is alert and oriented to person, place, and time. No cranial nerve deficit.  Skin: Skin is warm and  dry. She is not diaphoretic.  Anterior right leg with scabs, no weeping, erythema improved.  Psychiatric: She has a normal mood and affect. Her behavior is normal.  Nursing note and vitals reviewed.    Vicie Mutters, PA-C 11:50 AM [facility]

## 2015-08-05 ENCOUNTER — Telehealth: Payer: Self-pay | Admitting: Physician Assistant

## 2015-08-05 MED ORDER — ONDANSETRON HCL 4 MG PO TABS: 4.0000 mg | ORAL_TABLET | Freq: Every day | ORAL | Status: DC | PRN

## 2015-08-05 NOTE — Telephone Encounter (Signed)
Spoke with husband, he was instructed to call. Her weight is down from 180 to 168, she has some nausea and weakeness, last BP was 118/70.  We will start the zaroxyln every other day, will send in zofran, if worsening SOB, weakness, CP, unable to hold down fluids go to ER.  Continue to monitor weight and BP, if weight goes up by 5 lbs in one day start zaroxyln daily  Has follow up on the 3rd.

## 2015-08-07 ENCOUNTER — Inpatient Hospital Stay (HOSPITAL_COMMUNITY)
Admission: EM | Admit: 2015-08-07 | Discharge: 2015-08-09 | DRG: 433 | Disposition: A | Discharge status: Home / Self Care | Payer: PPO | Attending: Internal Medicine | Admitting: Internal Medicine

## 2015-08-07 ENCOUNTER — Encounter (HOSPITAL_COMMUNITY): Payer: Self-pay | Admitting: *Deleted

## 2015-08-07 ENCOUNTER — Emergency Department (HOSPITAL_COMMUNITY): Payer: PPO

## 2015-08-07 DIAGNOSIS — Z87891 Personal history of nicotine dependence: Secondary | ICD-10-CM | POA: Diagnosis not present

## 2015-08-07 DIAGNOSIS — E860 Dehydration: Secondary | ICD-10-CM | POA: Diagnosis present

## 2015-08-07 DIAGNOSIS — J42 Unspecified chronic bronchitis: Secondary | ICD-10-CM | POA: Diagnosis not present

## 2015-08-07 DIAGNOSIS — E669 Obesity, unspecified: Secondary | ICD-10-CM | POA: Diagnosis present

## 2015-08-07 DIAGNOSIS — R7303 Prediabetes: Secondary | ICD-10-CM | POA: Diagnosis present

## 2015-08-07 DIAGNOSIS — Z96643 Presence of artificial hip joint, bilateral: Secondary | ICD-10-CM | POA: Diagnosis present

## 2015-08-07 DIAGNOSIS — Z882 Allergy status to sulfonamides status: Secondary | ICD-10-CM

## 2015-08-07 DIAGNOSIS — J9621 Acute and chronic respiratory failure with hypoxia: Secondary | ICD-10-CM | POA: Diagnosis present

## 2015-08-07 DIAGNOSIS — E871 Hypo-osmolality and hyponatremia: Secondary | ICD-10-CM | POA: Diagnosis not present

## 2015-08-07 DIAGNOSIS — D696 Thrombocytopenia, unspecified: Secondary | ICD-10-CM | POA: Diagnosis not present

## 2015-08-07 DIAGNOSIS — N289 Disorder of kidney and ureter, unspecified: Secondary | ICD-10-CM

## 2015-08-07 DIAGNOSIS — I872 Venous insufficiency (chronic) (peripheral): Secondary | ICD-10-CM | POA: Diagnosis present

## 2015-08-07 DIAGNOSIS — Z888 Allergy status to other drugs, medicaments and biological substances status: Secondary | ICD-10-CM | POA: Diagnosis not present

## 2015-08-07 DIAGNOSIS — Z6831 Body mass index (BMI) 31.0-31.9, adult: Secondary | ICD-10-CM

## 2015-08-07 DIAGNOSIS — K7460 Unspecified cirrhosis of liver: Secondary | ICD-10-CM | POA: Diagnosis not present

## 2015-08-07 DIAGNOSIS — I10 Essential (primary) hypertension: Secondary | ICD-10-CM | POA: Diagnosis not present

## 2015-08-07 DIAGNOSIS — Z7982 Long term (current) use of aspirin: Secondary | ICD-10-CM | POA: Diagnosis not present

## 2015-08-07 DIAGNOSIS — L03115 Cellulitis of right lower limb: Secondary | ICD-10-CM | POA: Diagnosis not present

## 2015-08-07 DIAGNOSIS — Z7952 Long term (current) use of systemic steroids: Secondary | ICD-10-CM | POA: Diagnosis not present

## 2015-08-07 DIAGNOSIS — R531 Weakness: Secondary | ICD-10-CM

## 2015-08-07 DIAGNOSIS — J449 Chronic obstructive pulmonary disease, unspecified: Secondary | ICD-10-CM | POA: Diagnosis present

## 2015-08-07 DIAGNOSIS — N182 Chronic kidney disease, stage 2 (mild): Secondary | ICD-10-CM | POA: Diagnosis present

## 2015-08-07 DIAGNOSIS — Z833 Family history of diabetes mellitus: Secondary | ICD-10-CM | POA: Diagnosis not present

## 2015-08-07 DIAGNOSIS — E785 Hyperlipidemia, unspecified: Secondary | ICD-10-CM | POA: Diagnosis present

## 2015-08-07 DIAGNOSIS — M797 Fibromyalgia: Secondary | ICD-10-CM | POA: Diagnosis present

## 2015-08-07 DIAGNOSIS — I130 Hypertensive heart and chronic kidney disease with heart failure and stage 1 through stage 4 chronic kidney disease, or unspecified chronic kidney disease: Secondary | ICD-10-CM | POA: Diagnosis not present

## 2015-08-07 DIAGNOSIS — Z9049 Acquired absence of other specified parts of digestive tract: Secondary | ICD-10-CM

## 2015-08-07 DIAGNOSIS — N179 Acute kidney failure, unspecified: Secondary | ICD-10-CM

## 2015-08-07 DIAGNOSIS — Z9981 Dependence on supplemental oxygen: Secondary | ICD-10-CM | POA: Diagnosis not present

## 2015-08-07 DIAGNOSIS — Z79899 Other long term (current) drug therapy: Secondary | ICD-10-CM | POA: Diagnosis not present

## 2015-08-07 DIAGNOSIS — E876 Hypokalemia: Secondary | ICD-10-CM | POA: Diagnosis not present

## 2015-08-07 DIAGNOSIS — K7469 Other cirrhosis of liver: Secondary | ICD-10-CM

## 2015-08-07 DIAGNOSIS — K5900 Constipation, unspecified: Secondary | ICD-10-CM | POA: Diagnosis not present

## 2015-08-07 DIAGNOSIS — T502X5A Adverse effect of carbonic-anhydrase inhibitors, benzothiadiazides and other diuretics, initial encounter: Secondary | ICD-10-CM | POA: Diagnosis present

## 2015-08-07 DIAGNOSIS — E878 Other disorders of electrolyte and fluid balance, not elsewhere classified: Secondary | ICD-10-CM | POA: Diagnosis present

## 2015-08-07 DIAGNOSIS — Z7951 Long term (current) use of inhaled steroids: Secondary | ICD-10-CM | POA: Diagnosis not present

## 2015-08-07 DIAGNOSIS — E873 Alkalosis: Secondary | ICD-10-CM | POA: Diagnosis present

## 2015-08-07 DIAGNOSIS — Z8249 Family history of ischemic heart disease and other diseases of the circulatory system: Secondary | ICD-10-CM

## 2015-08-07 DIAGNOSIS — E273 Drug-induced adrenocortical insufficiency: Secondary | ICD-10-CM | POA: Diagnosis not present

## 2015-08-07 DIAGNOSIS — R0600 Dyspnea, unspecified: Secondary | ICD-10-CM | POA: Diagnosis not present

## 2015-08-07 DIAGNOSIS — R188 Other ascites: Secondary | ICD-10-CM | POA: Diagnosis not present

## 2015-08-07 DIAGNOSIS — E1322 Other specified diabetes mellitus with diabetic chronic kidney disease: Secondary | ICD-10-CM

## 2015-08-07 DIAGNOSIS — I5030 Unspecified diastolic (congestive) heart failure: Secondary | ICD-10-CM | POA: Diagnosis present

## 2015-08-07 DIAGNOSIS — J9611 Chronic respiratory failure with hypoxia: Secondary | ICD-10-CM | POA: Diagnosis present

## 2015-08-07 DIAGNOSIS — E1365 Other specified diabetes mellitus with hyperglycemia: Secondary | ICD-10-CM

## 2015-08-07 LAB — CBC
HEMATOCRIT: 43 % (ref 36.0–46.0)
HEMOGLOBIN: 14.5 g/dL (ref 12.0–15.0)
MCH: 32.5 pg (ref 26.0–34.0)
MCHC: 33.7 g/dL (ref 30.0–36.0)
MCV: 96.4 fL (ref 78.0–100.0)
Platelets: 99 10*3/uL — ABNORMAL LOW (ref 150–400)
RBC: 4.46 MIL/uL (ref 3.87–5.11)
RDW: 14.9 % (ref 11.5–15.5)
WBC: 11.9 10*3/uL — ABNORMAL HIGH (ref 4.0–10.5)

## 2015-08-07 LAB — MAGNESIUM: Magnesium: 2.2 mg/dL (ref 1.7–2.4)

## 2015-08-07 LAB — URINE MICROSCOPIC-ADD ON: BACTERIA UA: NONE SEEN

## 2015-08-07 LAB — COMPREHENSIVE METABOLIC PANEL
ALBUMIN: 3.7 g/dL (ref 3.5–5.0)
ALK PHOS: 113 U/L (ref 38–126)
ALT: 35 U/L (ref 14–54)
ANION GAP: 17 — AB (ref 5–15)
AST: 47 U/L — AB (ref 15–41)
BILIRUBIN TOTAL: 1 mg/dL (ref 0.3–1.2)
BUN: 40 mg/dL — AB (ref 6–20)
CALCIUM: 10 mg/dL (ref 8.9–10.3)
CO2: 38 mmol/L — ABNORMAL HIGH (ref 22–32)
CREATININE: 1.68 mg/dL — AB (ref 0.44–1.00)
Chloride: 78 mmol/L — ABNORMAL LOW (ref 101–111)
GFR calc Af Amer: 35 mL/min — ABNORMAL LOW (ref >60)
GFR calc non Af Amer: 30 mL/min — ABNORMAL LOW (ref >60)
GLUCOSE: 148 mg/dL — AB (ref 65–99)
Potassium: 2.7 mmol/L — CL (ref 3.5–5.1)
SODIUM: 133 mmol/L — AB (ref 135–145)
Total Protein: 6.7 g/dL (ref 6.5–8.1)

## 2015-08-07 LAB — URINALYSIS, ROUTINE W REFLEX MICROSCOPIC
Bilirubin Urine: NEGATIVE
GLUCOSE, UA: NEGATIVE mg/dL
HGB URINE DIPSTICK: NEGATIVE
Ketones, ur: NEGATIVE mg/dL
Nitrite: NEGATIVE
PH: 6.5 (ref 5.0–8.0)
Protein, ur: NEGATIVE mg/dL
SPECIFIC GRAVITY, URINE: 1.012 (ref 1.005–1.030)

## 2015-08-07 LAB — LIPASE, BLOOD: Lipase: 41 U/L (ref 11–51)

## 2015-08-07 MED ORDER — TAMOXIFEN CITRATE 20 MG PO TABS
20.0000 mg | ORAL_TABLET | Freq: Every day | ORAL | Status: DC
  Administered 2015-08-08 – 2015-08-09 (×2): 20 mg via ORAL
  Filled 2015-08-07 (×3): qty 1

## 2015-08-07 MED ORDER — ASPIRIN 81 MG PO CHEW
81.0000 mg | CHEWABLE_TABLET | Freq: Every day | ORAL | Status: DC
  Administered 2015-08-08 – 2015-08-09 (×2): 81 mg via ORAL
  Filled 2015-08-07 (×2): qty 1

## 2015-08-07 MED ORDER — CYCLOBENZAPRINE HCL 10 MG PO TABS: 10.0000 mg | ORAL_TABLET | Freq: Three times a day (TID) | ORAL | Status: DC | PRN

## 2015-08-07 MED ORDER — MAGNESIUM 250 MG PO TABS: 250.0000 mg | ORAL_TABLET | Freq: Every day | ORAL | Status: DC

## 2015-08-07 MED ORDER — IPRATROPIUM-ALBUTEROL 0.5-2.5 (3) MG/3ML IN SOLN: 3.0000 mL | RESPIRATORY_TRACT | Status: DC | PRN

## 2015-08-07 MED ORDER — GABAPENTIN 300 MG PO CAPS
300.0000 mg | ORAL_CAPSULE | Freq: Three times a day (TID) | ORAL | Status: DC
  Administered 2015-08-07 – 2015-08-09 (×5): 300 mg via ORAL
  Filled 2015-08-07 (×5): qty 1

## 2015-08-07 MED ORDER — SODIUM CHLORIDE 0.9 % IJ SOLN
3.0000 mL | Freq: Two times a day (BID) | INTRAMUSCULAR | Status: DC
  Administered 2015-08-08 – 2015-08-09 (×2): 3 mL via INTRAVENOUS

## 2015-08-07 MED ORDER — METHYLPREDNISOLONE SODIUM SUCC 125 MG IJ SOLR
60.0000 mg | Freq: Two times a day (BID) | INTRAMUSCULAR | Status: DC
  Administered 2015-08-07 – 2015-08-08 (×2): 60 mg via INTRAVENOUS
  Filled 2015-08-07 (×2): qty 2

## 2015-08-07 MED ORDER — SODIUM CHLORIDE 0.9 % IV BOLUS (SEPSIS)
1000.0000 mL | Freq: Once | INTRAVENOUS | Status: AC
  Administered 2015-08-07: 1000 mL via INTRAVENOUS

## 2015-08-07 MED ORDER — NYSTATIN 100000 UNIT/ML MT SUSP
5.0000 mL | Freq: Four times a day (QID) | OROMUCOSAL | Status: DC
  Administered 2015-08-07 – 2015-08-09 (×6): 500000 [IU] via ORAL
  Filled 2015-08-07 (×6): qty 5

## 2015-08-07 MED ORDER — MAGNESIUM OXIDE 400 (241.3 MG) MG PO TABS
200.0000 mg | ORAL_TABLET | Freq: Every day | ORAL | Status: DC
  Administered 2015-08-08 – 2015-08-09 (×2): 200 mg via ORAL
  Filled 2015-08-07 (×2): qty 1

## 2015-08-07 MED ORDER — CITALOPRAM HYDROBROMIDE 20 MG PO TABS
20.0000 mg | ORAL_TABLET | Freq: Every day | ORAL | Status: DC
  Administered 2015-08-08 – 2015-08-09 (×2): 20 mg via ORAL
  Filled 2015-08-07 (×2): qty 1

## 2015-08-07 MED ORDER — MAGNESIUM OXIDE 400 (241.3 MG) MG PO TABS
200.0000 mg | ORAL_TABLET | Freq: Every day | ORAL | Status: DC
Start: 2015-08-07 — End: 2015-08-07

## 2015-08-07 MED ORDER — ONDANSETRON HCL 4 MG PO TABS: 4.0000 mg | ORAL_TABLET | Freq: Three times a day (TID) | ORAL | Status: DC | PRN

## 2015-08-07 MED ORDER — ALPRAZOLAM 0.25 MG PO TABS
0.2500 mg | ORAL_TABLET | Freq: Three times a day (TID) | ORAL | Status: DC | PRN
  Administered 2015-08-07: 0.25 mg via ORAL
  Administered 2015-08-08: 0.5 mg via ORAL
  Filled 2015-08-07: qty 2
  Filled 2015-08-07: qty 1

## 2015-08-07 MED ORDER — SODIUM CHLORIDE 0.9 % IV SOLN
INTRAVENOUS | Status: AC
  Administered 2015-08-07: 75 mL/h via INTRAVENOUS
  Administered 2015-08-08: 06:00:00 via INTRAVENOUS

## 2015-08-07 MED ORDER — DOXYCYCLINE HYCLATE 100 MG PO TABS
100.0000 mg | ORAL_TABLET | Freq: Two times a day (BID) | ORAL | Status: DC
  Administered 2015-08-07 – 2015-08-09 (×4): 100 mg via ORAL
  Filled 2015-08-07 (×11): qty 1

## 2015-08-07 MED ORDER — PANTOPRAZOLE SODIUM 40 MG PO TBEC
40.0000 mg | DELAYED_RELEASE_TABLET | Freq: Every day | ORAL | Status: DC
  Administered 2015-08-07 – 2015-08-09 (×3): 40 mg via ORAL
  Filled 2015-08-07 (×3): qty 1

## 2015-08-07 MED ORDER — FERROUS SULFATE 325 (65 FE) MG PO TABS
325.0000 mg | ORAL_TABLET | Freq: Two times a day (BID) | ORAL | Status: DC
Start: 2015-08-07 — End: 2015-08-09
  Administered 2015-08-07 – 2015-08-09 (×4): 325 mg via ORAL
  Filled 2015-08-07 (×4): qty 1

## 2015-08-07 MED ORDER — POTASSIUM CHLORIDE CRYS ER 20 MEQ PO TBCR
30.0000 meq | EXTENDED_RELEASE_TABLET | Freq: Once | ORAL | Status: AC
  Administered 2015-08-07: 30 meq via ORAL
  Filled 2015-08-07: qty 1

## 2015-08-07 MED ORDER — ENSURE ENLIVE PO LIQD
237.0000 mL | Freq: Two times a day (BID) | ORAL | Status: DC
  Administered 2015-08-08 – 2015-08-09 (×3): 237 mL via ORAL

## 2015-08-07 MED ORDER — NADOLOL 40 MG PO TABS
40.0000 mg | ORAL_TABLET | Freq: Every day | ORAL | Status: DC
Start: 2015-08-08 — End: 2015-08-09
  Administered 2015-08-08 – 2015-08-09 (×2): 40 mg via ORAL
  Filled 2015-08-07 (×3): qty 1

## 2015-08-07 MED ORDER — POTASSIUM CHLORIDE CRYS ER 20 MEQ PO TBCR
40.0000 meq | EXTENDED_RELEASE_TABLET | Freq: Once | ORAL | Status: AC
  Administered 2015-08-07: 40 meq via ORAL
  Filled 2015-08-07: qty 2

## 2015-08-07 MED ORDER — POTASSIUM CHLORIDE 10 MEQ/100ML IV SOLN
10.0000 meq | INTRAVENOUS | Status: AC
  Administered 2015-08-07 (×3): 10 meq via INTRAVENOUS
  Filled 2015-08-07 (×3): qty 100

## 2015-08-07 NOTE — ED Provider Notes (Signed)
CSN: [id number]     Arrival date & time 08/07/15  1353 History   First MD Initiated Contact with Patient 08/07/15 1735     Chief Complaint  Patient presents with  . Nausea  . Constipation  . Fatigue      HPI Patient presents to the emergency department complaining of generalized weakness and nausea.  She feels so weak she's had difficulty getting up out of the bed.  She was seen in emergency department on July 26, 2015 at that time she was having some lower extremity swelling and weight gain and her Lasix was increased from 80 mg 120 mg.  Although reports that she urinated almost every 30 minutes last night.  Her weight is decreased 14 pounds since her Lasix dose was changed.  She denies shortness of breath.  No productive cough.  No fevers or chills.  No dysuria.   Past Medical History  Diagnosis Date  . Hx of shoulder replacement   . Hypertension   . Hyperlipidemia   . COPD (chronic obstructive pulmonary disease) ([place])   . Vitamin D deficiency   . IBS (irritable bowel syndrome)   . Fibromyalgia   . Hepatic cirrhosis [place])    Past Surgical History  Procedure Laterality Date  . Hip arthroplasty Bilateral   . Shoulder surgery Right   . Back surgery    . Cholecystectomy    . Neck surgery    . Esophagogastroduodenoscopy  07/16/2012    Procedure: ESOPHAGOGASTRODUODENOSCOPY (EGD);  Surgeon: Inda Castle, MD;  Location: [facility];  Service: Endoscopy;  Laterality: N/A;  . Gastric varices banding  07/16/2012    Procedure: GASTRIC VARICES BANDING;  Surgeon: Inda Castle, MD;  Location: [facility];  Service: Endoscopy;  Laterality: N/A;   Family History  Problem Relation Age of Onset  . Colon cancer Neg Hx   . Diabetes Brother   . Heart disease Sister     A Fib  . Heart disease Mother    Social History  Substance Use Topics  . Smoking status: Former Smoker -- 1.00 packs/day for 50 years    Types: Cigarettes    Quit date: 11/05/2014  . Smokeless tobacco: Never  Used     Comment: Tobacco info given 10/21/13  . Alcohol Use: 0.0 oz/week    0 Standard drinks or equivalent per week     Comment: rarely   OB History    No data available     Review of Systems  All other systems reviewed and are negative.     Allergies  Atorvastatin; Diphenhydramine hcl; Diphenhydramine hcl (sleep); Gemfibrozil; Hydrocodone-acetaminophen; Hydrocodone-acetaminophen; Loratadine; Loratadine-pseudoephedrine er; Lorazepam; Simvastatin; Sulfamethoxazole; and Sulfonamide derivatives  Home Medications   Prior to Admission medications   Medication Sig Start Date End Date Taking? Authorizing Provider  acetaminophen-codeine (TYLENOL #3) 300-30 MG tablet TAKE ONE-HALF TO ONE TABLET BY MOUTH FOUR TIMES DAILY AS NEEDED 05/26/15   Unk Pinto, MD  allopurinol (ZYLOPRIM) 300 MG tablet 1/2-1 tablet daily for gout 03/03/15 03/02/18  Unk Pinto, MD  ALPRAZolam Duanne Moron) 0.5 MG tablet 1/2-1 pil up to 3 times a day for anxiety 07/28/15   Vicie Mutters, PA-C  amitriptyline (ELAVIL) 10 MG tablet Take 1 tablet (10 mg total) by mouth 3 (three) times daily as needed. 06/23/15   Unk Pinto, MD  amoxicillin (AMOXIL) 500 MG capsule TAKE 4 TABLETS BY MOUTH 1-2 HOURS PRIOR TO DENTAL WORK 04/26/15   Historical Provider, MD  aspirin 81 MG tablet Take 81 mg  by mouth daily.    Historical Provider, MD  benzonatate (TESSALON) 100 MG capsule TAKE 1 CAPSULE (100 MG TOTAL) BY MOUTH 3 (THREE) TIMES DAILY AS NEEDED FOR COUGH. 04/23/14   Unk Pinto, MD  cefUROXime (CEFTIN) 250 MG tablet Take 1 tablet (250 mg total) by mouth 2 (two) times daily. 06/22/15   Vicie Mutters, PA-C  Cholecalciferol (VITAMIN D-3) 1000 UNITS CAPS Take 5,000 Units by mouth daily.    Historical Provider, MD  citalopram (CELEXA) 40 MG tablet TAKE 1 TABLET BY MOUTH TWICE A  DAY FOR MOOD 05/23/15   Unk Pinto, MD  colchicine 0.6 MG tablet Take 1 tablet (0.6 mg total) by mouth daily. 03/09/15 03/08/16  Unk Pinto, MD   cyclobenzaprine (FLEXERIL) 10 MG tablet Take 1 tablet (10 mg total) by mouth 3 (three) times daily as needed for muscle spasms. 11/17/14   Courtney Forcucci, PA-C  doxycycline (VIBRAMYCIN) 100 MG capsule Take 1 capsule (100 mg total) by mouth 2 (two) times daily. 07/26/15   Shary Decamp, PA-C  ferrous sulfate 325 (65 FE) MG tablet TAKE 1 TABLET TWICE DAILY Patient taking differently: TAKE 1 TABLET ONCE DAILY 01/19/13   Inda Castle, MD  fluconazole (DIFLUCAN) 100 MG tablet Take 1 tablet (100 mg total) by mouth daily. 08/02/15 09/01/15  Vicie Mutters, PA-C  furosemide (LASIX) 40 MG tablet Take 3 tablets (120 mg total) by mouth 2 (two) times daily. 07/26/15   Shary Decamp, PA-C  gabapentin (NEURONTIN) 300 MG capsule take 1 capsule by mouth in the morning, 1 capsule in the afternoon and 1-3 capsules at night 07/27/15   Vicie Mutters, PA-C  guaiFENesin (ROBITUSSIN) 100 MG/5ML liquid Take 5-10 mLs (100-200 mg total) by mouth every 4 (four) hours as needed for cough. 07/26/15   Shary Decamp, PA-C  ipratropium (ATROVENT) 0.02 % nebulizer solution use 1 vial in nebulizer EVERY 4 HOURS AS NEEDED FOR WHEEZING OR SHORTNESS OF BREATH 06/15/15   Vicie Mutters, PA-C  IRON PO Take 325 mg by mouth.    Historical Provider, MD  levofloxacin (LEVAQUIN) 500 MG tablet Take 1 tablet (500 mg total) by mouth daily. 07/26/15   Shary Decamp, PA-C  Magnesium 250 MG TABS Take 250 mg by mouth daily at 6 (six) AM.    Historical Provider, MD  metFORMIN (GLUCOPHAGE-XR) 500 MG 24 hr tablet TAKE 2 TABLETS BY MOUTH TWICE DAILY AFTER MEALS 12/21/14   Unk Pinto, MD  metolazone (ZAROXOLYN) 5 MG tablet Take daily with lasix or as directed for fluid over load 08/02/15   Vicie Mutters, PA-C  mupirocin cream (BACTROBAN) 2 % Apply 1 application topically 3 (three) times daily. For 7-14 days 07/28/15   Vicie Mutters, PA-C  nadolol (CORGARD) 40 MG tablet Take 1 tablet (40 mg total) by mouth daily. 07/11/15   Unk Pinto, MD  nystatin  (MYCOSTATIN) 100000 UNIT/ML suspension Take 5 mLs (500,000 Units total) by mouth 4 (four) times daily. Swish and swallow 08/02/15   Vicie Mutters, PA-C  Omega-3 Fatty Acids (FISH OIL) 1000 MG CAPS Take 1,000 mg by mouth 2 (two) times daily.    Historical Provider, MD  ondansetron (ZOFRAN) 4 MG tablet Take 1 tablet (4 mg total) by mouth daily as needed for nausea or vomiting. 08/05/15 08/04/16  Vicie Mutters, PA-C  OVER THE COUNTER MEDICATION Take 1-2 tablets by mouth as needed (constipation). Natural Veg Laxative    Historical Provider, MD  OXYGEN Inhale 3 L/min into the lungs continuous as needed. On exertion and at home  Historical Provider, MD  pantoprazole (PROTONIX) 40 MG tablet Take 1 tablet (40 mg total) by mouth 2 (two) times daily. 12/11/14   Unk Pinto, MD  predniSONE (DELTASONE) 10 MG tablet Take 6 tablets (60 mg total) by mouth daily. 07/26/15   Shary Decamp, PA-C  spironolactone (ALDACTONE) 25 MG tablet Take 1 tablet (25 mg total) by mouth 3 times a day. For fluids and swelling 06/27/15   Unk Pinto, MD  tamoxifen (NOLVADEX) 20 MG tablet Take 1 tablet (20 mg total) by mouth daily. 04/26/15   Courtney Forcucci, PA-C  terconazole (TERAZOL 7) 0.4 % vaginal cream Place 1 applicator vaginally at bedtime. 05/04/15   Vicie Mutters, PA-C  triamcinolone cream (KENALOG) 0.1 % Apply 1 application topically 3 (three) times daily. 05/16/15   Courtney Forcucci, PA-C   BP 104/90 mmHg  Pulse 74  Temp(Src) 97.8 F (36.6 C) (Oral)  Resp 18  SpO2 100% Physical Exam  Constitutional: She is oriented to person, place, and time. She appears well-developed and well-nourished. No distress.  HENT:  Head: Normocephalic and atraumatic.  Eyes: EOM are normal.  Neck: Normal range of motion.  Cardiovascular: Normal rate, regular rhythm and normal heart sounds.   Pulmonary/Chest: Effort normal and breath sounds normal.  Abdominal: Soft. She exhibits no distension. There is no tenderness.   Musculoskeletal: Normal range of motion.  Mild edema bilaterally.  Nonpitting  Neurological: She is alert and oriented to person, place, and time.  Skin: Skin is warm and dry.  Psychiatric: She has a normal mood and affect. Judgment normal.  Nursing note and vitals reviewed.   ED Course  Procedures (including critical care time) Labs Review Labs Reviewed  COMPREHENSIVE METABOLIC PANEL - Abnormal; Notable for the following:    Sodium 133 (*)    Potassium 2.7 (*)    Chloride 78 (*)    CO2 38 (*)    Glucose, Bld 148 (*)    BUN 40 (*)    Creatinine, Ser 1.68 (*)    AST 47 (*)    GFR calc non Af Amer 30 (*)    GFR calc Af Amer 35 (*)    Anion gap 17 (*)    All other components within normal limits  CBC - Abnormal; Notable for the following:    WBC 11.9 (*)    Platelets 99 (*)    All other components within normal limits  LIPASE, BLOOD  URINALYSIS, ROUTINE W REFLEX MICROSCOPIC (NOT AT [facility])   BUN  Date Value Ref Range Status  08/07/2015 40* 6 - 20 mg/dL Final  07/28/2015 31* 7 - 25 mg/dL Final  07/26/2015 17 6 - 20 mg/dL Final  07/22/2015 14 7 - 25 mg/dL Final   CREAT  Date Value Ref Range Status  07/28/2015 1.18* 0.50 - 0.99 mg/dL Final  07/22/2015 1.14* 0.50 - 0.99 mg/dL Final  06/14/2015 0.96 0.50 - 0.99 mg/dL Final  05/11/2015 1.09* 0.50 - 0.99 mg/dL Final   CREATININE, SER  Date Value Ref Range Status  08/07/2015 1.68* 0.44 - 1.00 mg/dL Final  07/26/2015 1.04* 0.44 - 1.00 mg/dL Final  09/03/2014 0.87 0.50 - 1.10 mg/dL Final  06/06/2011 0.83 0.50 - 1.10 mg/dL Final       Imaging Review Dg Abd Acute W/chest  08/07/2015  CLINICAL DATA:  Constipation and nausea since last night. EXAM: DG ABDOMEN ACUTE W/ 1V CHEST COMPARISON:  Chest x-ray from 07/26/2015 FINDINGS: The lungs are clear wiithout focal pneumonia, edema, pneumothorax or pleural effusion. The cardiopericardial  silhouette is within normal limits for size. Bones are demineralized in the patient is status  post right shoulder replacement. Upright film shows no evidence for intraperitoneal free air. There is no evidence for gaseous bowel dilation to suggest obstruction. 5-6 mm stones project over the right kidney. Surgical clips in the right upper quadrant compatible with prior cholecystectomy. Patient is status post lower lumbar fusion. Bilateral hip prostheses are evident. IMPRESSION: 1. No acute cardiopulmonary findings. 2. No evidence for bowel obstruction or perforation. 3. Probable right renal stones. Electronically Signed   By: Misty Stanley M.D.   On: 08/07/2015 15:08   I have personally reviewed and evaluated these images and lab results as part of my medical decision-making.   EKG Interpretation None      MDM   Final diagnoses:  None    I suspect her generalized weakness is secondary to dehydration and hypokalemia.  She'll be hydrated with IV fluids at this time.  She does have new or worsening renal insufficiency.  IV potassium now.    Jola Schmidt, MD 08/07/15 [phone]

## 2015-08-07 NOTE — ED Notes (Addendum)
Attempted Report x1.   

## 2015-08-07 NOTE — ED Notes (Signed)
MD Campos at the bedside

## 2015-08-07 NOTE — ED Notes (Signed)
Pt and family member reports pt being constipated, no bowel movement x 3 days. Having nausea since last night and generalized fatigue.

## 2015-08-07 NOTE — ED Notes (Signed)
Reported to Dr. Venora Maples, pt's Potassium is 2.7.

## 2015-08-07 NOTE — ED Notes (Signed)
Attempted Report x1.   

## 2015-08-07 NOTE — H&P (Addendum)
History and Physical    [name] PJA:[id number] DOB: 1945-08-24 DOA: 08/07/2015  Referring physician: Dr. Venora Maples PCP: Alesia Richards, MD  Specialists: none  Chief Complaint: weakness  HPI: [name] is a 70 y.o. female has a past medical history significant for COPD on baseline 3L [place], HTN, HLD, liver cirrhosis, has been having worsening abdominal swelling and bilateral LE edema as well as worsening dyspnea for the past 3-4 weeks and significant weight gain of ~ 20 lbs. She came to the ED and say her PCP around 12/20 when her Lasix dose was increased from 80 BID to 120 BID. She has dropped about 14 lbs. She was just added metolazone on 12/27. Over the past 2-3 days she has been increasingly weak barely able to get out of bed, has been urinating excessively last night every 30 min or so. She endorses nausea without vomiting. She denies fever or chills. She denies chest pain or palpitations. She endorses shortness of breath however this is at baseline. She denies dysuria, denies blood in her stools, no reported confusion. In the ED she was found to be mildly hyponatremic to 133, hypokalemic to 2.7, and with AKI Cr 1.68. She has mild leukocytosis to 11 and thrombocytopenia to 99. [facility] asked for admission for AKI, hypokalemia.  Review of Systems: As per history of present illness, otherwise 10 point review of systems negative  Past Medical History  Diagnosis Date  . Hx of shoulder replacement   . Hypertension   . Hyperlipidemia   . COPD (chronic obstructive pulmonary disease) ([place])   . Vitamin D deficiency   . IBS (irritable bowel syndrome)   . Fibromyalgia   . Hepatic cirrhosis [facility])    Past Surgical History  Procedure Laterality Date  . Hip arthroplasty Bilateral   . Shoulder surgery Right   . Back surgery    . Cholecystectomy    . Neck surgery    . Esophagogastroduodenoscopy  07/16/2012    Procedure: ESOPHAGOGASTRODUODENOSCOPY (EGD);  Surgeon: Inda Castle, MD;   Location: [facility];  Service: Endoscopy;  Laterality: N/A;  . Gastric varices banding  07/16/2012    Procedure: GASTRIC VARICES BANDING;  Surgeon: Inda Castle, MD;  Location: [facility];  Service: Endoscopy;  Laterality: N/A;   Social History:  reports that she quit smoking about 9 months ago. Her smoking use included Cigarettes. She has a 50 pack-year smoking history. She has never used smokeless tobacco. She reports that she drinks alcohol. She reports that she does not use illicit drugs.  Allergies  Allergen Reactions  . Atorvastatin Other (See Comments)    Doesn't remember  . Diphenhydramine Hcl (Sleep) Hives  . Gemfibrozil Other (See Comments)    Doesn't remember  . Hydrocodone-Acetaminophen Other (See Comments)    Doesn't remember  . Loratadine Hives  . Lorazepam Hives  . Simvastatin   . Sulfamethoxazole Hives  . Sulfonamide Derivatives     Family History  Problem Relation Age of Onset  . Colon cancer Neg Hx   . Diabetes Brother   . Heart disease Sister     A Fib  . Heart disease Mother     Prior to Admission medications   Medication Sig Start Date End Date Taking? Authorizing Provider  fluconazole (DIFLUCAN) 100 MG tablet Take 1 tablet (100 mg total) by mouth daily. 08/02/15 09/01/15 Yes Vicie Mutters, PA-C  furosemide (LASIX) 40 MG tablet Take 3 tablets (120 mg total) by mouth 2 (two) times daily. 07/26/15  Yes Shary Decamp, PA-C  metolazone (ZAROXOLYN) 5 MG tablet Take daily with lasix or as directed for fluid over load Patient taking differently: Take 5 mg by mouth daily. Take daily with lasix or as directed for fluid over load 08/02/15  Yes Vicie Mutters, PA-C  acetaminophen-codeine (TYLENOL #3) 300-30 MG tablet TAKE ONE-HALF TO ONE TABLET BY MOUTH FOUR TIMES DAILY AS NEEDED 05/26/15   Unk Pinto, MD  allopurinol (ZYLOPRIM) 300 MG tablet 1/2-1 tablet daily for gout 03/03/15 03/02/18  Unk Pinto, MD  ALPRAZolam Duanne Moron) 0.5 MG tablet 1/2-1 pil up to 3  times a day for anxiety 07/28/15   Vicie Mutters, PA-C  amitriptyline (ELAVIL) 10 MG tablet Take 1 tablet (10 mg total) by mouth 3 (three) times daily as needed. 06/23/15   Unk Pinto, MD  amoxicillin (AMOXIL) 500 MG capsule TAKE 4 TABLETS BY MOUTH 1-2 HOURS PRIOR TO DENTAL WORK 04/26/15   Historical Provider, MD  aspirin 81 MG tablet Take 81 mg by mouth daily.    Historical Provider, MD  benzonatate (TESSALON) 100 MG capsule TAKE 1 CAPSULE (100 MG TOTAL) BY MOUTH 3 (THREE) TIMES DAILY AS NEEDED FOR COUGH. 04/23/14   Unk Pinto, MD  cefUROXime (CEFTIN) 250 MG tablet Take 1 tablet (250 mg total) by mouth 2 (two) times daily. 06/22/15   Vicie Mutters, PA-C  Cholecalciferol (VITAMIN D-3) 1000 UNITS CAPS Take 5,000 Units by mouth daily.    Historical Provider, MD  citalopram (CELEXA) 40 MG tablet TAKE 1 TABLET BY MOUTH TWICE A  DAY FOR MOOD 05/23/15   Unk Pinto, MD  colchicine 0.6 MG tablet Take 1 tablet (0.6 mg total) by mouth daily. 03/09/15 03/08/16  Unk Pinto, MD  cyclobenzaprine (FLEXERIL) 10 MG tablet Take 1 tablet (10 mg total) by mouth 3 (three) times daily as needed for muscle spasms. 11/17/14   Courtney Forcucci, PA-C  doxycycline (VIBRAMYCIN) 100 MG capsule Take 1 capsule (100 mg total) by mouth 2 (two) times daily. 07/26/15   Shary Decamp, PA-C  ferrous sulfate 325 (65 FE) MG tablet TAKE 1 TABLET TWICE DAILY Patient taking differently: TAKE 1 TABLET ONCE DAILY 01/19/13   Inda Castle, MD  gabapentin (NEURONTIN) 300 MG capsule take 1 capsule by mouth in the morning, 1 capsule in the afternoon and 1-3 capsules at night 07/27/15   Vicie Mutters, PA-C  guaiFENesin (ROBITUSSIN) 100 MG/5ML liquid Take 5-10 mLs (100-200 mg total) by mouth every 4 (four) hours as needed for cough. 07/26/15   Shary Decamp, PA-C  ipratropium (ATROVENT) 0.02 % nebulizer solution use 1 vial in nebulizer EVERY 4 HOURS AS NEEDED FOR WHEEZING OR SHORTNESS OF BREATH 06/15/15   Vicie Mutters, PA-C  IRON PO  Take 325 mg by mouth.    Historical Provider, MD  levofloxacin (LEVAQUIN) 500 MG tablet Take 1 tablet (500 mg total) by mouth daily. 07/26/15   Shary Decamp, PA-C  Magnesium 250 MG TABS Take 250 mg by mouth daily at 6 (six) AM.    Historical Provider, MD  metFORMIN (GLUCOPHAGE-XR) 500 MG 24 hr tablet TAKE 2 TABLETS BY MOUTH TWICE DAILY AFTER MEALS 12/21/14   Unk Pinto, MD  mupirocin cream (BACTROBAN) 2 % Apply 1 application topically 3 (three) times daily. For 7-14 days 07/28/15   Vicie Mutters, PA-C  nadolol (CORGARD) 40 MG tablet Take 1 tablet (40 mg total) by mouth daily. 07/11/15   Unk Pinto, MD  nystatin (MYCOSTATIN) 100000 UNIT/ML suspension Take 5 mLs (500,000 Units total) by mouth 4 (four)  times daily. Swish and swallow 08/02/15   Vicie Mutters, PA-C  Omega-3 Fatty Acids (FISH OIL) 1000 MG CAPS Take 1,000 mg by mouth 2 (two) times daily.    Historical Provider, MD  ondansetron (ZOFRAN) 4 MG tablet Take 1 tablet (4 mg total) by mouth daily as needed for nausea or vomiting. 08/05/15 08/04/16  Vicie Mutters, PA-C  OVER THE COUNTER MEDICATION Take 1-2 tablets by mouth as needed (constipation). Natural Veg Laxative    Historical Provider, MD  OXYGEN Inhale 3 L/min into the lungs continuous as needed. On exertion and at home    Historical Provider, MD  pantoprazole (PROTONIX) 40 MG tablet Take 1 tablet (40 mg total) by mouth 2 (two) times daily. 12/11/14   Unk Pinto, MD  predniSONE (DELTASONE) 10 MG tablet Take 6 tablets (60 mg total) by mouth daily. 07/26/15   Shary Decamp, PA-C  spironolactone (ALDACTONE) 25 MG tablet Take 1 tablet (25 mg total) by mouth 3 times a day. For fluids and swelling 06/27/15   Unk Pinto, MD  tamoxifen (NOLVADEX) 20 MG tablet Take 1 tablet (20 mg total) by mouth daily. 04/26/15   Courtney Forcucci, PA-C  terconazole (TERAZOL 7) 0.4 % vaginal cream Place 1 applicator vaginally at bedtime. 05/04/15   Vicie Mutters, PA-C  triamcinolone cream (KENALOG) 0.1  % Apply 1 application topically 3 (three) times daily. 05/16/15   Starlyn Skeans, PA-C   Physical Exam: Filed Vitals:   08/07/15 1416  BP: 104/90  Pulse: 74  Temp: 97.8 F (36.6 C)  TempSrc: Oral  Resp: 18  SpO2: 100%     GENERAL: flushed, appears weak  HEENT: head NCAT, no scleral icterus. Pupils round and reactive. Mucous membranes are dry   NECK: Supple. No carotid bruits.   LUNGS: Clear to auscultation. No wheezing or crackles  HEART: Regular rate and rhythm without murmur. 2+ pulses, no JVD, no peripheral edema  ABDOMEN: Soft, nontender, and ascites present. Positive bowel sounds.   EXTREMITIES: Without any cyanosis, clubbing. Healing cellulitic rash on right, chronic venous stasis changes bilaterally  NEUROLOGIC: Alert and oriented x3. Cranial nerves II through XII are grossly intact. Strength 5/5 in all 4.  PSYCHIATRIC: Normal mood and affect   Labs on Admission:  Basic Metabolic Panel:  Recent Labs Lab 08/07/15 1423  NA 133*  K 2.7*  CL 78*  CO2 38*  GLUCOSE 148*  BUN 40*  CREATININE 1.68*  CALCIUM 10.0   Liver Function Tests:  Recent Labs Lab 08/07/15 1423  AST 47*  ALT 35  ALKPHOS 113  BILITOT 1.0  PROT 6.7  ALBUMIN 3.7    Recent Labs Lab 08/07/15 1423  LIPASE 41   CBC:  Recent Labs Lab 08/07/15 1423  WBC 11.9*  HGB 14.5  HCT 43.0  MCV 96.4  PLT 99*   BNP (last 3 results)  Recent Labs  07/26/15 1300  BNP 58.8   Radiological Exams on Admission: Dg Abd Acute W/chest  08/07/2015  CLINICAL DATA:  Constipation and nausea since last night. EXAM: DG ABDOMEN ACUTE W/ 1V CHEST COMPARISON:  Chest x-ray from 07/26/2015 FINDINGS: The lungs are clear wiithout focal pneumonia, edema, pneumothorax or pleural effusion. The cardiopericardial silhouette is within normal limits for size. Bones are demineralized in the patient is status post right shoulder replacement. Upright film shows no evidence for intraperitoneal free air. There is  no evidence for gaseous bowel dilation to suggest obstruction. 5-6 mm stones project over the right kidney. Surgical clips in the right  upper quadrant compatible with prior cholecystectomy. Patient is status post lower lumbar fusion. Bilateral hip prostheses are evident. IMPRESSION: 1. No acute cardiopulmonary findings. 2. No evidence for bowel obstruction or perforation. 3. Probable right renal stones. Electronically Signed   By: Misty Stanley M.D.   On: 08/07/2015 15:08    EKG: Independently reviewed.  Assessment/Plan Active Problems:   Hepatic cirrhosis (HCC)   Uncontrolled secondary diabetes mellitus with stage 2 CKD (GFR 60-89) (HCC)   Hypertension   Hyperlipidemia   COPD (chronic obstructive pulmonary disease) (HCC)   Obesity   Peripheral edema   Venous insufficiency   Abdominal discomfort   Cellulitis of right lower extremity   Hypokalemia   Respiratory failure with hypoxia (HCC)   Thrombocytopenia (HCC)   Hyponatremia   AKI (acute kidney injury) ([place])   Generalized weakness  - This is likely due to dehydration as well as hypokalemia after diuresis at home  - possibly contributing is relative adrenal insufficiency  - PT consult  Dehydration - Hold her home Lasix and metolazone - Gentle hydration overnight at 75 mL per hour - Strict I's and O's - Daily weights - Close monitoring of her fluid status  Iatrogenic adrenal insufficiency - Patient has been on chronic steroids for a long time, she was recently on 60 mg for a few days (she doesn't know why) and ran out 4 days ago and has not been taking any prednisone over the weekend - IV Solu-Medrol for now  Hypokalemia - Likely due to diuretics, replete IV 3 rounds as well as by mouth - check magnesium  Lower extremity edema - Currently she has no edema, however this has been one of the main issues for her. She was supposed to get him echocardiogram as an outpatient, we'll obtain one while she is here   Hyponatremia    - Likely secondary to diuretic use and dehydration, provide IV fluids and repeat BMP in the morning  Thrombocytopenia - In the setting of liver disease   Liver cirrhosis - With decompensation as ascites is present. Not tense, no need for paracentesis currently - Obtain an INR in the morning to calculate MELD - continue nadolol for history of esophageal varices  Lower extremity cellulitis - Continue home doxycycline   Acute kidney injury on CKD stage II - Due to diuretics, gentle hydration overnight - hold metformin, diuretics  COPD with chronic hypoxic respiratory failure - this appears stable, she has no wheezing, continue home medications - continue home O2, 3L  Probable hypochloremic contraction alkalosis -  normal saline   Pre-diabetes - hold Metformin  Diet: heart healthy Fluids: NS DVT Prophylaxis: SCD  Code Status: Full  Family Communication: d/w husband bedside  Disposition Plan: admit to tele, expect more than 2 nights   [name] M. Cruzita Lederer, MD [facility] Pager [phone]  If 7PM-7AM, please contact night-coverage [email or web address] Password [facility] 08/07/2015, 6:53 PM

## 2015-08-07 NOTE — ED Notes (Addendum)
Spoke with Gerald Stabs, Agricultural consultant, and reported that pt. Has a Potassium of 2.7

## 2015-08-08 ENCOUNTER — Inpatient Hospital Stay (HOSPITAL_COMMUNITY): Payer: PPO

## 2015-08-08 DIAGNOSIS — R0600 Dyspnea, unspecified: Secondary | ICD-10-CM | POA: Diagnosis not present

## 2015-08-08 DIAGNOSIS — J42 Unspecified chronic bronchitis: Secondary | ICD-10-CM

## 2015-08-08 DIAGNOSIS — L03115 Cellulitis of right lower limb: Secondary | ICD-10-CM

## 2015-08-08 LAB — COMPREHENSIVE METABOLIC PANEL
ALK PHOS: 94 U/L (ref 38–126)
ALT: 35 U/L (ref 14–54)
AST: 44 U/L — ABNORMAL HIGH (ref 15–41)
Albumin: 3.3 g/dL — ABNORMAL LOW (ref 3.5–5.0)
Anion gap: 16 — ABNORMAL HIGH (ref 5–15)
BUN: 36 mg/dL — ABNORMAL HIGH (ref 6–20)
CALCIUM: 9.4 mg/dL (ref 8.9–10.3)
CO2: 30 mmol/L (ref 22–32)
CREATININE: 1.42 mg/dL — AB (ref 0.44–1.00)
Chloride: 88 mmol/L — ABNORMAL LOW (ref 101–111)
GFR, EST AFRICAN AMERICAN: 43 mL/min — AB (ref >60)
GFR, EST NON AFRICAN AMERICAN: 37 mL/min — AB (ref >60)
Glucose, Bld: 205 mg/dL — ABNORMAL HIGH (ref 65–99)
Potassium: 3.8 mmol/L (ref 3.5–5.1)
SODIUM: 134 mmol/L — AB (ref 135–145)
Total Bilirubin: 1.3 mg/dL — ABNORMAL HIGH (ref 0.3–1.2)
Total Protein: 5.8 g/dL — ABNORMAL LOW (ref 6.5–8.1)

## 2015-08-08 LAB — CBC
HCT: 39.5 % (ref 36.0–46.0)
Hemoglobin: 13.3 g/dL (ref 12.0–15.0)
MCH: 32.7 pg (ref 26.0–34.0)
MCHC: 33.7 g/dL (ref 30.0–36.0)
MCV: 97.1 fL (ref 78.0–100.0)
PLATELETS: 66 10*3/uL — AB (ref 150–400)
RBC: 4.07 MIL/uL (ref 3.87–5.11)
RDW: 14.8 % (ref 11.5–15.5)
WBC: 7.5 10*3/uL (ref 4.0–10.5)

## 2015-08-08 LAB — PROTIME-INR
INR: 1.08 (ref 0.00–1.49)
PROTHROMBIN TIME: 14.1 s (ref 11.6–15.2)

## 2015-08-08 MED ORDER — PREDNISONE 20 MG PO TABS
40.0000 mg | ORAL_TABLET | Freq: Every day | ORAL | Status: DC
  Administered 2015-08-08 – 2015-08-09 (×2): 40 mg via ORAL
  Filled 2015-08-08 (×2): qty 2

## 2015-08-08 MED ORDER — CETYLPYRIDINIUM CHLORIDE 0.05 % MT LIQD
7.0000 mL | Freq: Two times a day (BID) | OROMUCOSAL | Status: DC
Start: 2015-08-08 — End: 2015-08-08

## 2015-08-08 MED ORDER — BISACODYL 10 MG RE SUPP
10.0000 mg | Freq: Once | RECTAL | Status: AC
  Administered 2015-08-08: 10 mg via RECTAL
  Filled 2015-08-08: qty 1

## 2015-08-08 MED ORDER — POLYETHYLENE GLYCOL 3350 17 G PO PACK
17.0000 g | PACK | Freq: Every day | ORAL | Status: DC
  Administered 2015-08-08 – 2015-08-09 (×2): 17 g via ORAL
  Filled 2015-08-08 (×2): qty 1

## 2015-08-08 MED ORDER — DOCUSATE SODIUM 100 MG PO CAPS
100.0000 mg | ORAL_CAPSULE | Freq: Every day | ORAL | Status: DC
  Administered 2015-08-08 – 2015-08-09 (×2): 100 mg via ORAL
  Filled 2015-08-08 (×2): qty 1

## 2015-08-08 MED ORDER — SODIUM CHLORIDE 0.9 % IV SOLN
INTRAVENOUS | Status: AC
  Administered 2015-08-08: 13:00:00 via INTRAVENOUS

## 2015-08-08 MED ORDER — CETYLPYRIDINIUM CHLORIDE 0.05 % MT LIQD
7.0000 mL | Freq: Two times a day (BID) | OROMUCOSAL | Status: DC
  Administered 2015-08-08 – 2015-08-09 (×3): 7 mL via OROMUCOSAL

## 2015-08-08 NOTE — Progress Notes (Signed)
  Echocardiogram 2D Echocardiogram has been performed.  [name] M 08/08/2015, 8:47 AM

## 2015-08-08 NOTE — Progress Notes (Signed)
Nutrition Brief Note  Patient identified on the Malnutrition Screening Tool (MST) Report  Wt Readings from Last 15 Encounters:  08/07/15 172 lb 3.2 oz (78.109 kg)  08/02/15 179 lb (81.194 kg)  07/28/15 180 lb (81.647 kg)  07/26/15 180 lb (81.647 kg)  07/26/15 179 lb (81.194 kg)  07/22/15 182 lb (82.555 kg)  05/25/15 172 lb (78.019 kg)  05/11/15 172 lb (78.019 kg)  05/04/15 175 lb (79.379 kg)  04/15/15 166 lb (75.297 kg)  01/13/15 161 lb (73.029 kg)  11/17/14 160 lb (72.576 kg)  10/14/14 153 lb (69.4 kg)  09/21/14 158 lb (71.668 kg)  06/17/14 153 lb 9.6 oz (69.673 kg)   [name] is a 70 y.o. female has a past medical history significant for COPD on baseline 3L [place], HTN, HLD, liver cirrhosis, has been having worsening abdominal swelling and bilateral LE edema as well as worsening dyspnea for the past 3-4 weeks and significant weight gain of ~ 20 lbs. She came to the ED and say her PCP around 12/20 when her Lasix dose was increased from 80 BID to 120 BID. She has dropped about 14 lbs. She was just added metolazone on 12/27. Over the past 2-3 days she has been increasingly weak barely able to get out of bed, has been urinating excessively last night every 30 min or so. She endorses nausea without vomiting. She denies fever or chills. She denies chest pain or palpitations. She endorses shortness of breath however this is at baseline. She denies dysuria, denies blood in her stools, no reported confusion. In the ED she was found to be mildly hyponatremic to 133, hypokalemic to 2.7, and with AKI Cr 1.68. She has mild leukocytosis to 11 and thrombocytopenia to 99. TRH asked for admission for AKI, hypokalemia.  Spoke with pt and pt husband at bedside. Both report weight gain and loss related to fluid retention and diuretics. Pt reports her UBW is around 155#  Pt reports she ate all of her breakfast this morning and appetite is generally good. Both pt and husband reports poor appetite over the  past 1-2 days due to pt "being sick on her stomach", however, this has resolved.   Nutrition-Focused physical exam completed. Findings are no fat depletion, no muscle depletion, and mild edema.   Discussed importance of continued good PO intake to promote healing. Pt and husband deny and further nutritional concerns, however, expressed appreciation for visit.  Body mass index is 31.49 kg/(m^2). Patient meets criteria for obesity, class I based on current BMI.   Current diet order is Heart Healthy, patient is consuming approximately 100% of meals at this time. Labs and medications reviewed.   No nutrition interventions warranted at this time. If nutrition issues arise, please consult RD.   Tamee Battin A. Jimmye Norman, RD, LDN, CDE Pager: [phone] After hours Pager: [phone]

## 2015-08-08 NOTE — Evaluation (Signed)
Clinical/Bedside Swallow Evaluation Patient Details  Name: [name] MRN: [id number] Date of Birth: [birth year]  Today's Date: 08/08/2015 Time: SLP Start Time (ACUTE ONLY): 1340 SLP Stop Time (ACUTE ONLY): 1353 SLP Time Calculation (min) (ACUTE ONLY): 13 min  Past Medical History:  Past Medical History  Diagnosis Date  . Hx of shoulder replacement   . Hypertension   . Hyperlipidemia   . COPD (chronic obstructive pulmonary disease) ([place])   . Vitamin D deficiency   . IBS (irritable bowel syndrome)   . Fibromyalgia   . Hepatic cirrhosis [facility])    Past Surgical History:  Past Surgical History  Procedure Laterality Date  . Hip arthroplasty Bilateral   . Shoulder surgery Right   . Back surgery    . Cholecystectomy    . Neck surgery    . Esophagogastroduodenoscopy  07/16/2012    Procedure: ESOPHAGOGASTRODUODENOSCOPY (EGD);  Surgeon: Inda Castle, MD;  Location: [facility];  Service: Endoscopy;  Laterality: N/A;  . Gastric varices banding  07/16/2012    Procedure: GASTRIC VARICES BANDING;  Surgeon: Inda Castle, MD;  Location: [facility];  Service: Endoscopy;  Laterality: N/A;   HPI:  [name] is a 70 y.o. female has a past medical history significant for COPD on baseline 3L [place], HTN, HLD, liver cirrhosis, has been having worsening abdominal swelling and bilateral LE edema as well as worsening dyspnea for the past 3-4 weeks and significant weight gain of ~ 20 lbs. Pt admitted with hypokalemia.No acute cardiopulmonary finding on Xray. Noted complains of difficulty swallowing starting in 2015.    Assessment / Plan / Recommendation Clinical Impression  Patient presents with normal swallowing function without evidence of dysphagia or aspiration. Patient does endorse difficulty orally transiting pills her "entire life." No SLP f/u indicated at this time.     Aspiration Risk  No limitations    Diet Recommendation Regular;Thin liquid   Liquid Administration via:  Cup;Straw Medication Administration: Whole meds with liquid (may wish to try whole in puree or crushed in prefers) Supervision: Patient able to self feed Compensations: Slow rate;Small sips/bites Postural Changes: Seated upright at 90 degrees    Other  Recommendations Oral Care Recommendations: Oral care BID   Follow up Recommendations  None    Frequency and Duration            Prognosis        Swallow Study   General HPI: [name] is a 70 y.o. female has a past medical history significant for COPD on baseline 3L , HTN, HLD, liver cirrhosis, has been having worsening abdominal swelling and bilateral LE edema as well as worsening dyspnea for the past 3-4 weeks and significant weight gain of ~ 20 lbs. Pt admitted with hypokalemia.No acute cardiopulmonary finding on Xray. Noted complains of difficulty swallowing starting in 2015.  Type of Study: Bedside Swallow Evaluation Previous Swallow Assessment: none noted Diet Prior to this Study: Regular;Thin liquids Temperature Spikes Noted: No Respiratory Status: Nasal cannula History of Recent Intubation: No Behavior/Cognition: Alert;Cooperative;Pleasant mood Oral Cavity Assessment: Within Functional Limits Oral Care Completed by SLP: No Oral Cavity - Dentition: Adequate natural dentition Vision: Functional for self-feeding Self-Feeding Abilities: Able to feed self Patient Positioning: Upright in chair Baseline Vocal Quality: Normal Volitional Cough: Strong Volitional Swallow: Able to elicit    Oral/Motor/Sensory Function Overall Oral Motor/Sensory Function: Within functional limits   Ice Chips Ice chips: Not tested   Thin Liquid Thin Liquid: Within functional limits Presentation: Cup;Self Fed;Straw  Nectar Thick Nectar Thick Liquid: Not tested   Honey Thick Honey Thick Liquid: Not tested   Puree Puree: Not tested   Solid   GO   Kerry Valliant MA, CCC-SLP [phone]  Solid: Within functional  limits Presentation: Self Fed       [name] [name] 08/08/2015,1:59 PM

## 2015-08-08 NOTE — Progress Notes (Signed)
PROGRESS NOTE  [name] YYQ:[id number] DOB: 1946/07/22 DOA: 08/07/2015 PCP: Alesia Richards, MD  HPI: 70 y.o. female has a past medical history significant for COPD on baseline 3L [place], HTN, HLD, liver cirrhosis, has been having worsening abdominal swelling and bilateral LE edema, admitted on 1/1 in the setting of dehydration, hypokalemia due to diuretic use.   Subjective / 24 H Interval events - feeling much improved, alert, family bedside  - energy level increased  Assessment/Plan: Active Problems:   Hepatic cirrhosis (HCC)   Uncontrolled secondary diabetes mellitus with stage 2 CKD (GFR 60-89) (HCC)   Hypertension   Hyperlipidemia   COPD (chronic obstructive pulmonary disease) (HCC)   Obesity   Peripheral edema   Venous insufficiency   Abdominal discomfort   Cellulitis of right lower extremity   Hypokalemia   Respiratory failure with hypoxia (HCC)   Thrombocytopenia (HCC)   Hyponatremia   AKI (acute kidney injury) ([place])   Generalized weakness  - This is likely due to dehydration as well as hypokalemia after diuresis at home  - possibly contributing is relative adrenal insufficiency  - PT consulted, evaluated patient, no PT follow up  - improving   Dehydration - Hold her home Lasix and metolazone - Gentle hydration overnight at 75 mL per hour, Cr improved some - Daily weights, weight this morning 172 from 168 on admission, provide additional lb with 500 cc fluid  - Close monitoring of her fluid status  Iatrogenic adrenal insufficiency - Patient has been on chronic steroids for a long time, she was recently on 60 mg for a few days (she doesn't know why) and ran out 4 days ago and has not been taking any prednisone over the weekend - change IV solu-medrol to Prednisone today   Hypokalemia - improved with repletion, K 3.8 today  - Mg normal 2.2  Lower extremity edema - Currently she has no edema, however this has been one of the main issues for her. She  was supposed to get him echocardiogram as an outpatient, we'll obtain one while she is here >> 2 d echo done, pending  Hyponatremia  - improving with fluids, 500 cc more today   Thrombocytopenia - In the setting of liver disease, decreased more today, likely dilutional  Liver cirrhosis - With decompensation as ascites is present. Not tense, no need for paracentesis currently - MELD 13 - continue nadolol for history of esophageal varices  Lower extremity cellulitis - Continue home doxycycline   Acute kidney injury on CKD stage II - improved some overnight - hold metformin, diuretics  COPD with chronic hypoxic respiratory failure - this appears stable, she has no wheezing, continue home medications - continue home O2, 3L  Probable hypochloremic contraction alkalosis - improving, 500 cc NS today   Pre-diabetes - hold Metformin   Diet: Diet Heart Room service appropriate?: Yes; Fluid consistency:: Thin Fluids: NS DVT Prophylaxis: SCD  Code Status: Full Code Family Communication: d/w husband and son bedside  Disposition Plan: home when ready   Barriers to discharge: IVF, renal failure  Consultants:  None   Procedures:  None    Antibiotics  Anti-infectives    Start     Dose/Rate Route Frequency Ordered Stop   08/07/15 2200  doxycycline (VIBRA-TABS) tablet 100 mg     100 mg Oral 2 times daily 08/07/15 2029         Studies  Dg Abd Acute W/chest  08/07/2015  CLINICAL DATA:  Constipation and nausea since last  night. EXAM: DG ABDOMEN ACUTE W/ 1V CHEST COMPARISON:  Chest x-ray from 07/26/2015 FINDINGS: The lungs are clear wiithout focal pneumonia, edema, pneumothorax or pleural effusion. The cardiopericardial silhouette is within normal limits for size. Bones are demineralized in the patient is status post right shoulder replacement. Upright film shows no evidence for intraperitoneal free air. There is no evidence for gaseous bowel dilation to suggest obstruction.  5-6 mm stones project over the right kidney. Surgical clips in the right upper quadrant compatible with prior cholecystectomy. Patient is status post lower lumbar fusion. Bilateral hip prostheses are evident. IMPRESSION: 1. No acute cardiopulmonary findings. 2. No evidence for bowel obstruction or perforation. 3. Probable right renal stones. Electronically Signed   By: Misty Stanley M.D.   On: 08/07/2015 15:08    Objective  Filed Vitals:   08/07/15 1915 08/07/15 1945 08/07/15 2033 08/08/15 0523  BP:  146/95 112/54 155/76  Pulse: 90 92 80 86  Temp:   97.6 F (36.4 C) 98 F (36.7 C)  TempSrc:   Oral   Resp: 22 16 18 18   Height:   5' 2"  (1.575 m)   Weight:   78.109 kg (172 lb 3.2 oz)   SpO2: 97% 90% 97% 100%   No intake or output data in the 24 hours ending 08/08/15 0731 Filed Weights   08/07/15 1908 08/07/15 2033  Weight: 77.021 kg (169 lb 12.8 oz) 78.109 kg (172 lb 3.2 oz)    Exam:  GENERAL: NAD  HEENT: no scleral icterus, PERRL  NECK: supple, no LAD  LUNGS: CTA biL, no wheezing  HEART: RRR without MRG  ABDOMEN: soft, non tender  EXTREMITIES: no clubbing / cyanosis. Healing cellulitic rash on right, chronic venous stasis changes bilaterally  NEUROLOGIC: non focal   Data Reviewed: Basic Metabolic Panel:  Recent Labs Lab 08/07/15 1423 08/07/15 2041  NA 133*  --   K 2.7*  --   CL 78*  --   CO2 38*  --   GLUCOSE 148*  --   BUN 40*  --   CREATININE 1.68*  --   CALCIUM 10.0  --   MG  --  2.2   Liver Function Tests:  Recent Labs Lab 08/07/15 1423  AST 47*  ALT 35  ALKPHOS 113  BILITOT 1.0  PROT 6.7  ALBUMIN 3.7    Recent Labs Lab 08/07/15 1423  LIPASE 41   CBC:  Recent Labs Lab 08/07/15 1423  WBC 11.9*  HGB 14.5  HCT 43.0  MCV 96.4  PLT 99*   BNP (last 3 results)  Recent Labs  07/26/15 1300  BNP 58.8    Scheduled Meds: . antiseptic oral rinse  7 mL Mouth Rinse BID  . aspirin  81 mg Oral Daily  . citalopram  20 mg Oral Daily    . doxycycline  100 mg Oral BID  . feeding supplement (ENSURE ENLIVE)  237 mL Oral BID BM  . ferrous sulfate  325 mg Oral BID  . gabapentin  300 mg Oral TID  . magnesium oxide  200 mg Oral Daily  . methylPREDNISolone (SOLU-MEDROL) injection  60 mg Intravenous Q12H  . nadolol  40 mg Oral Daily  . nystatin  5 mL Oral QID  . pantoprazole  40 mg Oral Daily  . sodium chloride  3 mL Intravenous Q12H  . tamoxifen  20 mg Oral Daily   Continuous Infusions:   Marzetta Board, MD [facility] Pager [phone]. If 7 PM - 7 AM, please  contact night-coverage at [email or web address], password [facility] 08/08/2015, 7:31 AM  LOS: 1 day

## 2015-08-08 NOTE — Evaluation (Signed)
Physical Therapy Evaluation Patient Details Name: [name] MRN: [id number] DOB: 1945-10-10 Today's Date: 08/08/2015   History of Present Illness  [name] is a 70 y.o. female has a past medical history significant for COPD on baseline 3L [place], HTN, HLD, liver cirrhosis, has been having worsening abdominal swelling and bilateral LE edema as well as worsening dyspnea for the past 3-4 weeks and significant weight gain of ~ 20 lbs.   Pt admitted with hypokalemia.  Clinical Impression  Pt admitted with above diagnosis. Pt currently with functional limitations due to the deficits listed below (see PT Problem List).  Pt will benefit from skilled PT to increase their independence and safety with mobility to allow discharge to the venue listed below.  Pt moving well overall.  She will have 24 hour A at home and does have power chair for longer distances.  Will follow acutely, but do not feel she needs any skilled PT post acute care.     Follow Up Recommendations No PT follow up;Supervision for mobility/OOB    Equipment Recommendations  None recommended by PT    Recommendations for Other Services       Precautions / Restrictions Precautions Precaution Comments: oxygen dependent Restrictions Weight Bearing Restrictions: No      Mobility  Bed Mobility               General bed mobility comments: sitting up in recliner  Transfers Overall transfer level: Needs assistance Equipment used: Rolling walker (2 wheeled) Transfers: Sit to/from Stand Sit to Stand: Supervision         General transfer comment: S with standing for safety, but no physical A.  Ambulation/Gait Ambulation/Gait assistance: Min guard;Supervision Ambulation Distance (Feet): 55 Feet Assistive device: Rolling walker (2 wheeled) Gait Pattern/deviations: Step-through pattern;Decreased stride length Gait velocity: decreased   General Gait Details: Amb with RW with decreased speed, but no LOB. Pt states  she normally walks slow. o2 intact at 3 L/min with gait  Stairs            Wheelchair Mobility    Modified Rankin (Stroke Patients Only)       Balance Overall balance assessment: Needs assistance   Sitting balance-Leahy Scale: Good       Standing balance-Leahy Scale: Poor Standing balance comment: requires UE support in standing                             Pertinent Vitals/Pain Pain Assessment:  (constipation pain)    Home Living Family/patient expects to be discharged to:: Private residence Living Arrangements: Spouse/significant other Available Help at Discharge: Family;Available 24 hours/day   Home Access: Stairs to enter Entrance Stairs-Rails: None Entrance Stairs-Number of Steps: 2 Home Layout: One level Home Equipment: Walker - 2 wheels;Bedside commode;Shower seat;Electric scooter;Other (comment) (o2)      Prior Function Level of Independence: Independent with assistive device(s)         Comments: uses RW household distances and power chair for community distances     Hand Dominance        Extremity/Trunk Assessment   Upper Extremity Assessment: Overall WFL for tasks assessed           Lower Extremity Assessment: Overall WFL for tasks assessed      Cervical / Trunk Assessment: Normal  Communication   Communication: No difficulties  Cognition Arousal/Alertness: Awake/alert Behavior During Therapy: WFL for tasks assessed/performed Overall Cognitive Status: Within Functional Limits for  tasks assessed                      General Comments      Exercises        Assessment/Plan    PT Assessment Patient needs continued PT services  PT Diagnosis Difficulty walking   PT Problem List Decreased activity tolerance;Decreased balance;Decreased mobility  PT Treatment Interventions Gait training;Functional mobility training;Therapeutic activities;Therapeutic exercise;Stair training;Balance training   PT Goals  (Current goals can be found in the Care Plan section) Acute Rehab PT Goals Patient Stated Goal: Go home PT Goal Formulation: With patient Time For Goal Achievement: 08/15/15 Potential to Achieve Goals: Good    Frequency Min 3X/week   Barriers to discharge        Co-evaluation               End of Session Equipment Utilized During Treatment: Gait belt;Oxygen Activity Tolerance: Patient limited by fatigue Patient left: in chair;with call bell/phone within reach;with chair alarm set Nurse Communication: Mobility status         Time: 2094-7096 PT Time Calculation (min) (ACUTE ONLY): 26 min   Charges:   PT Evaluation $Initial PT Evaluation Tier I: 1 Procedure (low tier) PT Treatments $Gait Training: 8-22 mins   PT G Codes:        Kasai Beltran LUBECK 08/08/2015, 12:03 PM

## 2015-08-09 ENCOUNTER — Ambulatory Visit: Payer: Self-pay | Admitting: Internal Medicine

## 2015-08-09 DIAGNOSIS — R609 Edema, unspecified: Secondary | ICD-10-CM

## 2015-08-09 DIAGNOSIS — E669 Obesity, unspecified: Secondary | ICD-10-CM

## 2015-08-09 LAB — CBC
HCT: 36.6 % (ref 36.0–46.0)
Hemoglobin: 11.9 g/dL — ABNORMAL LOW (ref 12.0–15.0)
MCH: 31.3 pg (ref 26.0–34.0)
MCHC: 32.5 g/dL (ref 30.0–36.0)
MCV: 96.3 fL (ref 78.0–100.0)
PLATELETS: 76 10*3/uL — AB (ref 150–400)
RBC: 3.8 MIL/uL — ABNORMAL LOW (ref 3.87–5.11)
RDW: 14.6 % (ref 11.5–15.5)
WBC: 14.9 10*3/uL — ABNORMAL HIGH (ref 4.0–10.5)

## 2015-08-09 LAB — BASIC METABOLIC PANEL
Anion gap: 13 (ref 5–15)
BUN: 37 mg/dL — AB (ref 6–20)
CALCIUM: 9.8 mg/dL (ref 8.9–10.3)
CO2: 33 mmol/L — ABNORMAL HIGH (ref 22–32)
CREATININE: 1.22 mg/dL — AB (ref 0.44–1.00)
Chloride: 90 mmol/L — ABNORMAL LOW (ref 101–111)
GFR calc Af Amer: 52 mL/min — ABNORMAL LOW (ref >60)
GFR, EST NON AFRICAN AMERICAN: 44 mL/min — AB (ref >60)
Glucose, Bld: 377 mg/dL — ABNORMAL HIGH (ref 65–99)
Potassium: 3.7 mmol/L (ref 3.5–5.1)
SODIUM: 136 mmol/L (ref 135–145)

## 2015-08-09 MED ORDER — POTASSIUM CHLORIDE ER 10 MEQ PO TBCR: 20.0000 meq | EXTENDED_RELEASE_TABLET | Freq: Every day | ORAL | Status: DC

## 2015-08-09 MED ORDER — FUROSEMIDE 40 MG PO TABS: 40.0000 mg | ORAL_TABLET | Freq: Two times a day (BID) | ORAL | Status: DC

## 2015-08-09 MED ORDER — FUROSEMIDE 40 MG PO TABS: 80.0000 mg | ORAL_TABLET | Freq: Two times a day (BID) | ORAL | Status: DC

## 2015-08-09 MED ORDER — METOLAZONE 5 MG PO TABS: 5.0000 mg | ORAL_TABLET | ORAL | Status: DC

## 2015-08-09 MED ORDER — PREDNISONE 10 MG PO TABS: 40.0000 mg | ORAL_TABLET | Freq: Every day | ORAL | Status: DC

## 2015-08-09 NOTE — Progress Notes (Signed)
IV DCd and telemetry box returned to front desk. prescriptions given and DC instructions given to pt and husband. Both verbalized understanding. Pt left floor via W/C to lobby at 1530.

## 2015-08-09 NOTE — Care Management Note (Signed)
Case Management Note  Patient Details  Name: [name] MRN: [id number] Date of Birth: 28-Dec-1945  Subjective/Objective:                  Date- 08-09-15 Initial Assessment Spoke with patient at the bedside along with husband.  Introduced self as Tourist information centre manager and explained role in discharge planning and how to be reached.  Verified patient lives with spouse at home.  Verified patient anticipates to go home with spouse.  Patient has DME cane walker oxygen through [facility]. Expressed potential need for no other DME.  Patient denied  needing help with their medication.  Patient is driven by spouse to MD appointments.  Verified patient has PCP. Patient states they currently receive [place] services through no one.   Plan: CM will continue to follow for discharge planning and [facility] resources.   Carles Collet RN BSN CM [phone]    Action/Plan:  Patient and husband decline any CM resources, no needs identified, anticipate DC to home in care of husband.  Expected Discharge Date:                  Expected Discharge Plan:  Home/Self Care  In-House Referral:     Discharge planning Services  CM Consult  Post Acute Care Choice:    Choice offered to:     DME Arranged:    DME Agency:     HH Arranged:    [place] Agency:     Status of Service:  Completed, signed off  Medicare Important Message Given:    Date Medicare IM Given:    Medicare IM give by:    Date Additional Medicare IM Given:    Additional Medicare Important Message give by:     If discussed at [place] of Stay Meetings, dates discussed:    Additional Comments:  Carles Collet, RN 08/09/2015, 12:06 PM

## 2015-08-09 NOTE — Discharge Instructions (Addendum)
Follow with Kerry Richards, MD in 5-7 days  Please get a complete blood count and chemistry panel checked by your Primary MD at your next visit, and again as instructed by your Primary MD. Please get your medications reviewed and adjusted by your Primary MD.  Please request your Primary MD to go over all Hospital Tests and Procedure/Radiological results at the follow up, please get all Hospital records sent to your Prim MD by signing hospital release before you go home.  If you had Pneumonia of Lung problems at the Hospital: Please get a 2 view Chest X ray done in 6-8 weeks after hospital discharge or sooner if instructed by your Primary MD.  If you have Congestive Heart Failure: Please call your Cardiologist or Primary MD anytime you have any of the following symptoms:  1) 3 pound weight gain in 24 hours or 5 pounds in 1 week  2) shortness of breath, with or without a dry hacking cough  3) swelling in the hands, feet or stomach  4) if you have to sleep on extra pillows at night in order to breathe  Follow cardiac low salt diet and 1.5 lit/day fluid restriction.  If you have diabetes Accuchecks 4 times/day, Once in AM empty stomach and then before each meal. Log in all results and show them to your primary doctor at your next visit. If any glucose reading is under 80 or above 300 call your primary MD immediately.  If you have Seizure/Convulsions/Epilepsy: Please do not drive, operate heavy machinery, participate in activities at heights or participate in high speed sports until you have seen by Primary MD or a Neurologist and advised to do so again.  If you had Gastrointestinal Bleeding: Please ask your Primary MD to check a complete blood count within one week of discharge or at your next visit. Your endoscopic/colonoscopic biopsies that are pending at the time of discharge, will also need to followed by your Primary MD.  Get Medicines reviewed and adjusted. Please take all your  medications with you for your next visit with your Primary MD  Please request your Primary MD to go over all hospital tests and procedure/radiological results at the follow up, please ask your Primary MD to get all Hospital records sent to his/her office.  If you experience worsening of your admission symptoms, develop shortness of breath, life threatening emergency, suicidal or homicidal thoughts you must seek medical attention immediately by calling 911 or calling your MD immediately  if symptoms less severe.  You must read complete instructions/literature along with all the possible adverse reactions/side effects for all the Medicines you take and that have been prescribed to you. Take any new Medicines after you have completely understood and accpet all the possible adverse reactions/side effects.   Do not drive or operate heavy machinery when taking Pain medications.   Do not take more than prescribed Pain, Sleep and Anxiety Medications  Special Instructions: If you have smoked or chewed Tobacco  in the last 2 yrs please stop smoking, stop any regular Alcohol  and or any Recreational drug use.  Wear Seat belts while driving.  Please note You were cared for by a hospitalist during your hospital stay. If you have any questions about your discharge medications or the care you received while you were in the hospital after you are discharged, you can call the unit and asked to speak with the hospitalist on call if the hospitalist that took care of you is not available. Once  you are discharged, your primary care physician will handle any further medical issues. Please note that NO REFILLS for any discharge medications will be authorized once you are discharged, as it is imperative that you return to your primary care physician (or establish a relationship with a primary care physician if you do not have one) for your aftercare needs so that they can reassess your need for medications and monitor your  lab values.  You can reach the hospitalist office at phone [phone] or fax [phone]   If you do not have a primary care physician, you can call [phone] for a physician referral.  Activity: As tolerated with Full fall precautions use walker/cane & assistance as needed  Diet: low salt, heart healthy  Disposition Home    CHF  Discharge instructions  DIET  Low sodium Heart Healthy Diet with fluid restriction 1500cc/day  ACTIVITY  Avoid strenuous activity  WEIGH DAILY AT SAME TIME . CALL YOUR DOCTOR IF WEIGHT INCREASES BY MORE THAN 3  POUNDS IN 2 DAYS  CALL YOUR DOCTOR OR COME TO EMERGENCY ROOM IF WORSENING SHORTNESS OF BREATH OR  CHEST PAIN OR SWELLING  F/U with PMD in 1 week to recheck labs and adjust fluid pills as needed  If you smoke cigarettes or use any tobacco products you are advised to stop. Please ask nurse for any written materials  Or additional information you want regarding smoking cessation

## 2015-08-09 NOTE — Discharge Summary (Addendum)
Physician Discharge Summary  [name] XFG:[id number] DOB: Aug 23, 1945 DOA: 08/07/2015  PCP: Alesia Richards, MD  Admit date: 08/07/2015 Discharge date: 08/09/2015  Time spent: > 30 minutes  Recommendations for Outpatient Follow-up:  1. Follow up with Dr. Melford Aase as scheduled in 1 week   Discharge Diagnoses:  Active Problems:   Hepatic cirrhosis (HCC)   Uncontrolled secondary diabetes mellitus with stage 2 CKD (GFR 60-89) (HCC)   Hypertension   Hyperlipidemia   COPD (chronic obstructive pulmonary disease) (HCC)   Obesity   Peripheral edema   Venous insufficiency   Abdominal discomfort   Cellulitis of right lower extremity   Hypokalemia   Respiratory failure with hypoxia (HCC)   Thrombocytopenia (HCC)   Hyponatremia   AKI (acute kidney injury) ([facility])  Discharge Condition: stable  Diet recommendation: low salt  Filed Weights   08/07/15 1908 08/07/15 2033 08/09/15 0336  Weight: 77.021 kg (169 lb 12.8 oz) 78.109 kg (172 lb 3.2 oz) 79.2 kg (174 lb 9.7 oz)    History of present illness:  [name] is a 70 y.o. female has a past medical history significant for COPD on baseline 3L [place], HTN, HLD, liver cirrhosis, has been having worsening abdominal swelling and bilateral LE edema as well as worsening dyspnea for the past 3-4 weeks and significant weight gain of ~ 20 lbs. She came to the ED and say her PCP around 12/20 when her Lasix dose was increased from 80 BID to 120 BID. She has dropped about 14 lbs. She was just added metolazone on 12/27. Over the past 2-3 days she has been increasingly weak barely able to get out of bed, has been urinating excessively last night every 30 min or so. She endorses nausea without vomiting. She denies fever or chills. She denies chest pain or palpitations. She endorses shortness of breath however this is at baseline. She denies dysuria, denies blood in her stools, no reported confusion. In the ED she was found to be mildly hyponatremic to  133, hypokalemic to 2.7, and with AKI Cr 1.68. She has mild leukocytosis to 11 and thrombocytopenia to 99. [facility] asked for admission for AKI, hypokalemia.  Hospital Course:  Generalized weakness - This is likely due to dehydration as well as hypokalemia after diuresis at home, patient received about a total of 2.5 L of normal saline with improvement in her weakness. Possibly contributing is relative adrenal insufficiency. PT consulted, evaluated patient, no PT follow up Iatrogenic adrenal insufficiency - Patient has been on chronic steroids for a long time, she was recently on 60 mg for a few days (she doesn't know why) and ran out 4 days ago and has not been taking any prednisone over the weekend. She received Solu-medrol in the hospital and will be discharged on a prednisone taper then back to her chronic prednisone dose.  Hypokalemia - improved with repletion, K 3.8 today, will prescribe potassium while she is on Lasix.  Lower extremity edema - Currently she has no edema, however this has been one of the main issues for her. She was for few weeks on Lasix 40 BID without improvement and this was increased to 80 BID then 120 BID. Her edema is likely multifactorial due to venous insufficiency, diastolic CHF and liver cirrhosis. Discussed with patient extensively about diet and sodium restrictions, she is not following a low sodium diet, and she and her family will need extensive counseling as an outpatient as well.  Hyponatremia  - improving with fluids Thrombocytopenia -  In the setting of liver disease Liver cirrhosis - With decompensation as ascites is present. Not tense, no need for paracentesis currently. MELD 13. Continue nadolol for history of esophageal varices Lower extremity cellulitis - Continue home doxycycline  Acute kidney injury on CKD stage II - improved with hydration, close monitoring as an outpatient given Lasix. COPD with chronic hypoxic respiratory failure - this appears stable, she has  no wheezing, continue home medications Hypochloremic contraction alkalosis - improved with fluids Pre-diabetes - Metformin  Procedures:  2D echo  Study Conclusions - Procedure narrative: Transthoracic echocardiography. Imagequality was suboptimal. The study was technically difficult, as a result of poor acoustic windows and poor sound wave transmission. Left ventricle: The cavity size was normal. Wall thickness wasincreased in a pattern of moderate LVH. Systolic function wasnormal. The estimated ejection fraction was in the range of 55%to 60%. Doppler parameters are consistent with abnormal leftventricular relaxation (grade 1 diastolic dysfunction). The E/e&' ratio is between 8-15, suggesting indeterminate LV fillingpressure. Left atrium: The atrium was normal in size.   Consultations:  None   Discharge Exam: Filed Vitals:   08/08/15 1406 08/08/15 2152 08/09/15 0336 08/09/15 0543  BP: 138/61 134/73  136/58  Pulse: 84 92  82  Temp: 98 F (36.7 C) 98.4 F (36.9 C)  98.4 F (36.9 C)  TempSrc: Oral Oral  Oral  Resp: 16 16  16   Height:      Weight:   79.2 kg (174 lb 9.7 oz)   SpO2: 91% 98%  100%   General: NAD Cardiovascular: RRR Respiratory: CTA biL  Discharge Instructions Activity:  As tolerated   Get Medicines reviewed and adjusted: Please take all your medications with you for your next visit with your Primary MD  Please request your Primary MD to go over all hospital tests and procedure/radiological results at the follow up, please ask your Primary MD to get all Hospital records sent to his/her office.  If you experience worsening of your admission symptoms, develop shortness of breath, life threatening emergency, suicidal or homicidal thoughts you must seek medical attention immediately by calling 911 or calling your MD immediately if symptoms less severe.  You must read complete instructions/literature along with all the possible adverse reactions/side  effects for all the Medicines you take and that have been prescribed to you. Take any new Medicines after you have completely understood and accpet all the possible adverse reactions/side effects.   Do not drive when taking Pain medications.   Do not take more than prescribed Pain, Sleep and Anxiety Medications  Special Instructions: If you have smoked or chewed Tobacco in the last 2 yrs please stop smoking, stop any regular Alcohol and or any Recreational drug use.  Wear Seat belts while driving.  Please note  You were cared for by a hospitalist during your hospital stay. Once you are discharged, your primary care physician will handle any further medical issues. Please note that NO REFILLS for any discharge medications will be authorized once you are discharged, as it is imperative that you return to your primary care physician (or establish a relationship with a primary care physician if you do not have one) for your aftercare needs so that they can reassess your need for medications and monitor your lab values.    Medication List    STOP taking these medications        cefUROXime 250 MG tablet  Commonly known as:  CEFTIN     IRON PO  levofloxacin 500 MG tablet  Commonly known as:  LEVAQUIN     terconazole 0.4 % vaginal cream  Commonly known as:  TERAZOL 7     triamcinolone cream 0.1 %  Commonly known as:  KENALOG      TAKE these medications        acetaminophen-codeine 300-30 MG tablet  Commonly known as:  TYLENOL #3  TAKE ONE-HALF TO ONE TABLET BY MOUTH FOUR TIMES DAILY AS NEEDED     allopurinol 300 MG tablet  Commonly known as:  ZYLOPRIM  1/2-1 tablet daily for gout     ALPRAZolam 0.5 MG tablet  Commonly known as:  XANAX  1/2-1 pil up to 3 times a day for anxiety     amitriptyline 10 MG tablet  Commonly known as:  ELAVIL  Take 1 tablet (10 mg total) by mouth 3 (three) times daily as needed.     aspirin 81 MG tablet  Take 81 mg by mouth daily.      citalopram 40 MG tablet  Commonly known as:  CELEXA  TAKE 1 TABLET BY MOUTH TWICE A  DAY FOR MOOD     colchicine 0.6 MG tablet  Take 1 tablet (0.6 mg total) by mouth daily.     cyclobenzaprine 10 MG tablet  Commonly known as:  FLEXERIL  Take 1 tablet (10 mg total) by mouth 3 (three) times daily as needed for muscle spasms.     doxycycline 100 MG capsule  Commonly known as:  VIBRAMYCIN  Take 1 capsule (100 mg total) by mouth 2 (two) times daily.     ferrous sulfate 325 (65 FE) MG tablet  TAKE 1 TABLET TWICE DAILY     Fish Oil 1000 MG Caps  Take 1,000 mg by mouth 2 (two) times daily.     fluconazole 100 MG tablet  Commonly known as:  DIFLUCAN  Take 1 tablet (100 mg total) by mouth daily.     furosemide 40 MG tablet  Commonly known as:  LASIX  Take 1-2 tablets (40-80 mg total) by mouth 2 (two) times daily. 80 mg in am and 40 mg in pm     gabapentin 300 MG capsule  Commonly known as:  NEURONTIN  take 1 capsule by mouth in the morning, 1 capsule in the afternoon and 1-3 capsules at night     guaiFENesin 100 MG/5ML liquid  Commonly known as:  ROBITUSSIN  Take 5-10 mLs (100-200 mg total) by mouth every 4 (four) hours as needed for cough.     ipratropium 0.02 % nebulizer solution  Commonly known as:  ATROVENT  use 1 vial in nebulizer EVERY 4 HOURS AS NEEDED FOR WHEEZING OR SHORTNESS OF BREATH     Magnesium 250 MG Tabs  Take 250 mg by mouth daily at 6 (six) AM.     metFORMIN 500 MG 24 hr tablet  Commonly known as:  GLUCOPHAGE-XR  TAKE 2 TABLETS BY MOUTH TWICE DAILY AFTER MEALS     metolazone 5 MG tablet  Commonly known as:  ZAROXOLYN  Take 1 tablet (5 mg total) by mouth once a week. Take once a week with lasix or as directed for fluid overload     mupirocin cream 2 %  Commonly known as:  BACTROBAN  Apply 1 application topically 3 (three) times daily. For 7-14 days     nadolol 40 MG tablet  Commonly known as:  CORGARD  Take 1 tablet (40 mg total) by mouth daily.  nystatin 100000 UNIT/ML suspension  Commonly known as:  MYCOSTATIN  Take 5 mLs (500,000 Units total) by mouth 4 (four) times daily. Swish and swallow     ondansetron 4 MG tablet  Commonly known as:  ZOFRAN  Take 1 tablet (4 mg total) by mouth daily as needed for nausea or vomiting.     OVER THE COUNTER MEDICATION  Take 1-2 tablets by mouth as needed (constipation). Natural Veg Laxative     OXYGEN  Inhale 3 L/min into the lungs continuous as needed. On exertion and at home     pantoprazole 40 MG tablet  Commonly known as:  PROTONIX  Take 1 tablet (40 mg total) by mouth 2 (two) times daily.     potassium chloride 10 MEQ tablet  Commonly known as:  K-DUR  Take 2 tablets (20 mEq total) by mouth daily.     predniSONE 10 MG tablet  Commonly known as:  DELTASONE  Take 4 tablets (40 mg total) by mouth daily. 40 mg daily for 2 days then 20 mg daily for 2 days then 10 mg daily until seen by your PCP     spironolactone 25 MG tablet  Commonly known as:  ALDACTONE  Take 1 tablet (25 mg total) by mouth 3 times a day. For fluids and swelling     tamoxifen 20 MG tablet  Commonly known as:  NOLVADEX  Take 1 tablet (20 mg total) by mouth daily.     Vitamin D-3 1000 units Caps  Take 10,000 Units by mouth daily.       Follow-up Information    Follow up with Alesia Richards, MD. Schedule an appointment as soon as possible for a visit on 08/16/2015.   Specialty:  Internal Medicine   Why:  Appointment with Dr. Melford Aase is on 08/16/15 at 3:30   Contact information:   [address] [place] [address] [phone]      The results of significant diagnostics from this hospitalization (including imaging, microbiology, ancillary and laboratory) are listed below for reference.    Significant Diagnostic Studies: Dg Chest 2 View  07/26/2015  CLINICAL DATA:  Shortness of Breath EXAM: CHEST  2 VIEW COMPARISON:  May 09, 2015 FINDINGS: There is no edema or consolidation.  The heart size and pulmonary vascularity are normal. No adenopathy. There is evidence of a total shoulder replacement on the right with remodeling in the proximal right humerus. There is postoperative change in the lower cervical spine region. IMPRESSION: No edema or consolidation. Electronically Signed   By: Lowella Grip III M.D.   On: 07/26/2015 13:47   Dg Abd Acute W/chest  08/07/2015  CLINICAL DATA:  Constipation and nausea since last night. EXAM: DG ABDOMEN ACUTE W/ 1V CHEST COMPARISON:  Chest x-ray from 07/26/2015 FINDINGS: The lungs are clear wiithout focal pneumonia, edema, pneumothorax or pleural effusion. The cardiopericardial silhouette is within normal limits for size. Bones are demineralized in the patient is status post right shoulder replacement. Upright film shows no evidence for intraperitoneal free air. There is no evidence for gaseous bowel dilation to suggest obstruction. 5-6 mm stones project over the right kidney. Surgical clips in the right upper quadrant compatible with prior cholecystectomy. Patient is status post lower lumbar fusion. Bilateral hip prostheses are evident. IMPRESSION: 1. No acute cardiopulmonary findings. 2. No evidence for bowel obstruction or perforation. 3. Probable right renal stones. Electronically Signed   By: Misty Stanley M.D.   On: 08/07/2015 15:08    Microbiology:  No results found for this or any previous visit (from the past 240 hour(s)).   Labs: Basic Metabolic Panel:  Recent Labs Lab 08/07/15 1423 08/07/15 2041 08/08/15 0730 08/09/15 0705  NA 133*  --  134* 136  K 2.7*  --  3.8 3.7  CL 78*  --  88* 90*  CO2 38*  --  30 33*  GLUCOSE 148*  --  205* 377*  BUN 40*  --  36* 37*  CREATININE 1.68*  --  1.42* 1.22*  CALCIUM 10.0  --  9.4 9.8  MG  --  2.2  --   --    Liver Function Tests:  Recent Labs Lab 08/07/15 1423 08/08/15 0730  AST 47* 44*  ALT 35 35  ALKPHOS 113 94  BILITOT 1.0 1.3*  PROT 6.7 5.8*  ALBUMIN 3.7 3.3*     Recent Labs Lab 08/07/15 1423  LIPASE 41   CBC:  Recent Labs Lab 08/07/15 1423 08/08/15 0730 08/09/15 0705  WBC 11.9* 7.5 14.9*  HGB 14.5 13.3 11.9*  HCT 43.0 39.5 36.6  MCV 96.4 97.1 96.3  PLT 99* 66* 76*   BNP: BNP (last 3 results)  Recent Labs  07/26/15 1300  BNP 58.8     Signed:  Katerina Zurn  [facility] 08/09/2015, 3:37 PM

## 2015-08-09 NOTE — Plan of Care (Signed)
Problem: Food- and Nutrition-Related Knowledge Deficit (NB-1.1) Goal: Nutrition education Formal process to instruct or train a patient/client in a skill or to impart knowledge to help patients/clients voluntarily manage or modify food choices and eating behavior to maintain or improve health. Outcome: Adequate for Discharge Nutrition Education Note  RD consulted for nutrition education regarding a Heart Healthy diet.   Lipid Panel     Component Value Date/Time    CHOL 232* 07/22/2015 1218    TRIG 460* 07/22/2015 1218    HDL 29* 07/22/2015 1218    CHOLHDL 8.0* 07/22/2015 1218    VLDL NOT CALC 07/22/2015 1218    LDLCALC NOT CALC 07/22/2015 1218   Spoke with pt and husband at bedside. Both report that they have been advised to limit salt, carbohydrates, and fat in the past, however, have not been compliant with this advice. PTA they were using salt to season foods when cooking and occasionally at the table. Husband reports they often choose high sodium items such as canned vegetables. Pt reports the she enjoys the foods available on the low sodium diet, especially the Mrs Deliah Boston seasoning packets.   RD provided "Heart Healthy Nutrition Therapy" and "Sodium Free Seasonings" handouts from the [employer]. Reviewed patient's dietary recall. Provided examples on ways to decrease sodium and fat intake in diet. Discouraged intake of processed foods and use of salt shaker. Encouraged fresh fruits and vegetables as well as whole grain sources of carbohydrates to maximize fiber intake. Teach back method used.  Expect fair compliance.  Body mass index is 31.93 kg/(m^2). Pt meets criteria for obesity, class I based on current BMI.  Current diet order is Heart Healthy, patient is consuming approximately 60-95% of meals at this time. Labs and medications reviewed. No further nutrition interventions warranted at this time. RD contact information provided. If additional nutrition issues  arise, please re-consult RD.  Veera Stapleton A. Jimmye Norman, RD, LDN, CDE Pager: [phone] After hours Pager: [phone]

## 2015-08-16 ENCOUNTER — Encounter: Payer: Self-pay | Admitting: Internal Medicine

## 2015-08-16 ENCOUNTER — Ambulatory Visit (INDEPENDENT_AMBULATORY_CARE_PROVIDER_SITE_OTHER): Payer: PPO | Admitting: Internal Medicine

## 2015-08-16 VITALS — BP 124/64 | HR 76 | Temp 97.3°F | Resp 16 | Ht 62.5 in | Wt 179.4 lb

## 2015-08-16 DIAGNOSIS — K7469 Other cirrhosis of liver: Secondary | ICD-10-CM | POA: Diagnosis not present

## 2015-08-16 DIAGNOSIS — I10 Essential (primary) hypertension: Secondary | ICD-10-CM | POA: Diagnosis not present

## 2015-08-16 DIAGNOSIS — N183 Chronic kidney disease, stage 3 unspecified: Secondary | ICD-10-CM | POA: Insufficient documentation

## 2015-08-16 DIAGNOSIS — Z79899 Other long term (current) drug therapy: Secondary | ICD-10-CM

## 2015-08-16 DIAGNOSIS — E1122 Type 2 diabetes mellitus with diabetic chronic kidney disease: Secondary | ICD-10-CM | POA: Diagnosis not present

## 2015-08-16 NOTE — Progress Notes (Signed)
Subjective:    Patient ID: [name], female    DOB: [birth year], 70 y.o.   MRN: [id number]  HPI  Patient has HTN, T2_DM w/ CKD3 and NASH w/Cirrhosis & hx/o esophageal & gastric varices who was recently hospitalized after a 20+ weight loss after  decompensation following  diuresis with just a few doses of Metolazone and consequent hypokalemia at 2.7 and sl elevation of creat to 1.68 with both correcting back to her baseline with IVF rescue. Also note patient has EndStage O2 dependent COPD and probably [place]. Pt's husband reports wts have been stable over the last 7 days betw 174-174 # at home. Note patient did finally quit smoking Apr 2016 when she was started on O2.   Medication Sig  . acetaminophen-codeine (TYLENOL #3) 300-30 MG tablet TAKE ONE-HALF TO ONE TABLET BY MOUTH FOUR TIMES DAILY AS NEEDED  . allopurinol (ZYLOPRIM) 300 MG tablet 1/2-1 tablet daily for gout (Patient taking differently: Take 300 mg by mouth daily. )  . ALPRAZolam (XANAX) 0.5 MG tablet 1/2-1 pil up to 3 times a day for anxiety (Patient taking differently: Take 0.25-5 mg by mouth 3 (three) times daily as needed for anxiety. )  . amitriptyline (ELAVIL) 10 MG tablet Take 1 tablet (10 mg total) by mouth 3 (three) times daily as needed. (Patient taking differently: Take 10 mg by mouth 3 (three) times daily as needed for sleep. )  . aspirin 81 MG tablet Take 81 mg by mouth daily.  . Cholecalciferol (VITAMIN D-3) 1000 UNITS CAPS Take 10,000 Units by mouth daily.   . citalopram (CELEXA) 40 MG tablet TAKE 1 TABLET BY MOUTH TWICE A  DAY FOR MOOD  . colchicine 0.6 MG tablet Take 1 tablet (0.6 mg total) by mouth daily.  . cyclobenzaprine (FLEXERIL) 10 MG tablet Take 1 tablet (10 mg total) by mouth 3 (three) times daily as needed for muscle spasms.  . ferrous sulfate 325 (65 FE) MG tablet TAKE 1 TABLET TWICE DAILY (Patient taking differently: TAKE 1 TABLET ONCE DAILY)  . fluconazole (DIFLUCAN) 100 MG tablet Take 1 tablet (100 mg  total) by mouth daily.  . furosemide (LASIX) 40 MG tablet Take 1-2 tablets (40-80 mg total) by mouth 2 (two) times daily. 80 mg in am and 40 mg in pm  . gabapentin (NEURONTIN) 300 MG capsule take 1 capsule by mouth in the morning, 1 capsule in the afternoon and 1-3 capsules at night (Patient taking differently: Take 300 mg by mouth 3 (three) times daily. )  . guaiFENesin (ROBITUSSIN) 100 MG/5ML liquid Take 5-10 mLs (100-200 mg total) by mouth every 4 (four) hours as needed for cough.  Marland Kitchen ipratropium (ATROVENT) 0.02 % nebulizer solution use 1 vial in nebulizer EVERY 4 HOURS AS NEEDED FOR WHEEZING OR SHORTNESS OF BREATH  . Magnesium 250 MG TABS Take 250 mg by mouth daily at 6 (six) AM.  . metFORMIN (GLUCOPHAGE-XR) 500 MG 24 hr tablet TAKE 2 TABLETS BY MOUTH TWICE DAILY AFTER MEALS  . metolazone (ZAROXOLYN) 5 MG tablet Take 1 tablet (5 mg total) by mouth once a week. Take once a week with lasix or as directed for fluid overload  . mupirocin cream (BACTROBAN) 2 % Apply 1 application topically 3 (three) times daily. For 7-14 days  . nadolol (CORGARD) 40 MG tablet Take 1 tablet (40 mg total) by mouth daily.  Marland Kitchen nystatin (MYCOSTATIN) 100000 UNIT/ML suspension Take 5 mLs (500,000 Units total) by mouth 4 (four) times daily. Swish and swallow  .  Omega-3 Fatty Acids (FISH OIL) 1000 MG CAPS Take 1,000 mg by mouth 2 (two) times daily.  . ondansetron (ZOFRAN) 4 MG tablet Take 1 tablet (4 mg total) by mouth daily as needed for nausea or vomiting.  Marland Kitchen OVER THE COUNTER MEDICATION Take 1-2 tablets by mouth as needed (constipation). Natural Veg Laxative  . OXYGEN Inhale 3 L/min into the lungs continuous as needed. On exertion and at home  . pantoprazole (PROTONIX) 40 MG tablet Take 1 tablet (40 mg total) by mouth 2 (two) times daily.  . potassium chloride (K-DUR) 10 MEQ tablet Take 2 tablets (20 mEq total) by mouth daily.  . predniSONE (DELTASONE) 10 MG tablet Take 4 tablets (40 mg total) by mouth daily. 40 mg daily for  2 days then 20 mg daily for 2 days then 10 mg daily until seen by your PCP  . spironolactone (ALDACTONE) 25 MG tablet Take 1 tablet (25 mg total) by mouth 3 times a day. For fluids and swelling  . tamoxifen (NOLVADEX) 20 MG tablet Take 1 tablet (20 mg total) by mouth daily.  Marland Kitchen doxycycline (VIBRAMYCIN) 100 MG capsule Take 1 capsule (100 mg total) by mouth 2 (two) times daily.   No facility-administered medications prior to visit.   Allergies  Allergen Reactions  . Atorvastatin Other (See Comments)    Doesn't remember  . Diphenhydramine Hcl (Sleep) Hives  . Gemfibrozil Other (See Comments)    Doesn't remember  . Hydrocodone-Acetaminophen Other (See Comments)    Doesn't remember  . Loratadine Hives  . Lorazepam Hives  . Simvastatin   . Sulfamethoxazole Hives  . Sulfonamide Derivatives    Past Medical History  Diagnosis Date  . Hx of shoulder replacement   . Hypertension   . Hyperlipidemia   . COPD (chronic obstructive pulmonary disease) ([place])   . Vitamin D deficiency   . IBS (irritable bowel syndrome)   . Fibromyalgia   . Hepatic cirrhosis ([place])    Review of Systems 10 point systems review negative except as above.    Objective:   Physical Exam  BP 124/64 mmHg  Pulse 76  Temp(Src) 97.3 F (36.3 C)  Resp 16  Ht 5' 2.5" (1.588 m)  Wt 179 lb 6.4 oz (81.375 kg)  BMI 32.27 kg/m2   Elderly obese WF on nasal O2 in a wheelchair.   HEENT - Eac's patent. TM's Nl. EOM's full. PERRLA. NasoOroPharynx clear. Neck - supple. Nl Thyroid. Carotids 2+ & No bruits, nodes, JVD Chest - Clear equal BS w/o Rales, rhonchi, wheezes. Cor - HS soft distant w/ RRR w/o sig MGR. PP 1(+). 1-2  (+)  edema. Abd - protuberant w/o definite palp organomegaly. BS nl.  MS- FROM w/o deformities. Generalized decrease in muscle power, tone and bulk Nl. In W/C. Neuro - No obvious Cr N abnormalities. Sensory, motor and Cerebellar functions appear Nl w/o focal abnormalities. Psyche - No delusions, ideations  or obvious mood abnormalities.    Assessment & Plan:   1. Essential hypertension   2. CKD stage 3 due to type 2 diabetes mellitus ([place])   3. Cirrhosis of liver ([place])   4. Medication management  - CBC with Differential/Platelet - BASIC METABOLIC PANEL WITH GFR  Over 30 minutes of exam, counseling, chart review and high complex critical decision making was performed in evaluating patient & meds & recent labs. Patient has upcoming Cardiology & Pulmonary consults to evaluate abn 2D EC and help eval for Pulm HTN.

## 2015-08-17 LAB — CBC WITH DIFFERENTIAL/PLATELET
BASOS ABS: 0 10*3/uL (ref 0.0–0.1)
Basophils Relative: 0 % (ref 0–1)
Eosinophils Absolute: 0.1 10*3/uL (ref 0.0–0.7)
Eosinophils Relative: 2 % (ref 0–5)
HEMATOCRIT: 36.5 % (ref 36.0–46.0)
HEMOGLOBIN: 11.9 g/dL — AB (ref 12.0–15.0)
LYMPHS ABS: 1.5 10*3/uL (ref 0.7–4.0)
LYMPHS PCT: 22 % (ref 12–46)
MCH: 32.2 pg (ref 26.0–34.0)
MCHC: 32.6 g/dL (ref 30.0–36.0)
MCV: 98.9 fL (ref 78.0–100.0)
MONOS PCT: 6 % (ref 3–12)
MPV: 11.8 fL (ref 8.6–12.4)
Monocytes Absolute: 0.4 10*3/uL (ref 0.1–1.0)
NEUTROS ABS: 4.8 10*3/uL (ref 1.7–7.7)
Neutrophils Relative %: 70 % (ref 43–77)
PLATELETS: 71 10*3/uL — AB (ref 150–400)
RBC: 3.69 MIL/uL — ABNORMAL LOW (ref 3.87–5.11)
RDW: 16.1 % — ABNORMAL HIGH (ref 11.5–15.5)
WBC: 6.8 10*3/uL (ref 4.0–10.5)

## 2015-08-17 LAB — BASIC METABOLIC PANEL WITH GFR
BUN: 26 mg/dL — AB (ref 7–25)
CHLORIDE: 95 mmol/L — AB (ref 98–110)
CO2: 35 mmol/L — AB (ref 20–31)
CREATININE: 1.09 mg/dL — AB (ref 0.50–0.99)
Calcium: 8.9 mg/dL (ref 8.6–10.4)
GFR, Est African American: 60 mL/min (ref >=60)
GFR, Est Non African American: 52 mL/min — ABNORMAL LOW (ref >=60)
Glucose, Bld: 233 mg/dL — ABNORMAL HIGH (ref 65–99)
POTASSIUM: 4.6 mmol/L (ref 3.5–5.3)
Sodium: 139 mmol/L (ref 135–146)

## 2015-08-20 DIAGNOSIS — J449 Chronic obstructive pulmonary disease, unspecified: Secondary | ICD-10-CM | POA: Diagnosis not present

## 2015-08-24 ENCOUNTER — Other Ambulatory Visit: Payer: Self-pay | Admitting: *Deleted

## 2015-08-24 MED ORDER — IPRATROPIUM BROMIDE 0.02 % IN SOLN: RESPIRATORY_TRACT | Status: DC

## 2015-08-29 ENCOUNTER — Encounter: Payer: Self-pay | Admitting: Internal Medicine

## 2015-08-29 ENCOUNTER — Ambulatory Visit (INDEPENDENT_AMBULATORY_CARE_PROVIDER_SITE_OTHER): Payer: PPO | Admitting: Internal Medicine

## 2015-08-29 VITALS — BP 112/72 | HR 78 | Ht 62.5 in | Wt 180.4 lb

## 2015-08-29 DIAGNOSIS — J42 Unspecified chronic bronchitis: Secondary | ICD-10-CM | POA: Diagnosis not present

## 2015-08-29 DIAGNOSIS — J9611 Chronic respiratory failure with hypoxia: Secondary | ICD-10-CM

## 2015-08-29 DIAGNOSIS — I10 Essential (primary) hypertension: Secondary | ICD-10-CM | POA: Diagnosis not present

## 2015-08-29 DIAGNOSIS — J9612 Chronic respiratory failure with hypercapnia: Secondary | ICD-10-CM

## 2015-08-29 DIAGNOSIS — J449 Chronic obstructive pulmonary disease, unspecified: Secondary | ICD-10-CM

## 2015-08-29 MED ORDER — BISOPROLOL FUMARATE 10 MG PO TABS: 10.0000 mg | ORAL_TABLET | Freq: Every day | ORAL | Status: DC

## 2015-08-29 NOTE — Progress Notes (Signed)
Subjective:    Patient ID: [name], female    DOB: [birth year],    MRN: [id number]  HPI  89 yowf with sob x 2014 dx as copd > quit smoking 11/05/14 at wt 160  Did some  better with chronic doe  but still needed 3lpm 24/7 then worse around tgiving 2016 and downhill since then so referred to pulmonary clinic 08/29/2015 by Dr Franne Forts     08/29/2015 1st [facility] office visit/ [name]  maint duoneb tid/ 02 3lpm 24/7  Chief Complaint  Patient presents with  . Pulmonary Consult    Referred for chronic bronchitis. Recent labs/cxr.   present doe x across the room but also limited by back pain and leg weakness chronically/ mostly c/w bound/ duoneb helps some  Cough worse in am but no excess/ purulent sputum or mucus plugs    No obvious  day to day or daytime variabilty or assoc  cp or chest tightness, subjective wheeze overt sinus or hb symptoms. No unusual exp hx or h/o childhood pna/ asthma or knowledge of premature birth.  Sleeping ok without nocturnal  or early am exacerbation  of respiratory  c/o's or need for noct saba. Also denies any obvious fluctuation of symptoms with weather or environmental changes or other aggravating or alleviating factors except as outlined above   Current Medications, Allergies, Complete Past Medical History, Past Surgical History, Family History, and Social History were reviewed in [employer] record.              Review of Systems  Constitutional: Positive for unexpected weight change. Negative for fever.  HENT: Negative for congestion, dental problem, ear pain, nosebleeds, postnasal drip, rhinorrhea, sinus pressure, sneezing, sore throat and trouble swallowing.   Eyes: Negative for redness and itching.  Respiratory: Positive for shortness of breath. Negative for cough, chest tightness and wheezing.   Cardiovascular: Positive for chest pain. Negative for palpitations and leg swelling.  Gastrointestinal: Positive for  abdominal pain. Negative for nausea and vomiting.  Genitourinary: Negative for dysuria.  Musculoskeletal: Positive for arthralgias. Negative for joint swelling.  Skin: Negative for rash.  Neurological: Negative for headaches.  Hematological: Does not bruise/bleed easily.  Psychiatric/Behavioral: Positive for dysphoric mood. The patient is nervous/anxious.        Objective:   Physical Exam  W/c bound elderly wf nad on 3lpm pulsed   Wt Readings from Last 3 Encounters:  08/29/15 180 lb 6.4 oz (81.829 kg)  08/16/15 179 lb 6.4 oz (81.375 kg)  08/09/15 174 lb 9.7 oz (79.2 kg)    Vital signs reviewed  HEENT: nl dentition, turbinates, and oropharynx. Nl external ear canals without cough reflex   NECK :  without JVD/Nodes/TM/ nl carotid upstrokes bilaterally   LUNGS: no acc muscle use,  slt barrel contour, distant bs with faint exp wheeze bilaterally and mod increased exp time   CV:  RRR  no s3 or murmur or increase in P2, no edema   ABD:  soft and nontender with nl inspiratory excursion in the supine position. No bruits or organomegaly, bowel sounds nl  MS:  Nl gait/ ext warm without deformities, calf tenderness, cyanosis or clubbing No obvious joint restrictions   SKIN: warm and dry without lesions    NEURO:  alert, approp, nl sensorium with  no motor deficits     I personally reviewed images and agree with radiology impression as follows:  CXR:  07/26/15 No edema or consolidation.  Assessment & Plan:

## 2015-08-29 NOTE — Patient Instructions (Addendum)
Stop corgard and start bisoprolol  10 mg daily   Be sure you take pantoprazole 40 mg Take 30- 60 min before your first and last meals of the day   GERD (REFLUX)  is an extremely common cause of respiratory symptoms just like yours , many times with no obvious heartburn at all.    It can be treated with medication, but also with lifestyle changes including elevation of the head of your bed (ideally with 6 inch  bed blocks),  Smoking cessation, avoidance of late meals, excessive alcohol, and avoid fatty foods, chocolate, peppermint, colas, red wine, and acidic juices such as orange juice.  NO MINT OR MENTHOL PRODUCTS SO NO COUGH DROPS  USE SUGARLESS CANDY INSTEAD (Jolley ranchers or Stover's or Life Savers) or even ice chips will also do - the key is to swallow to prevent all throat clearing. NO OIL BASED VITAMINS - use powdered substitutes.   Nebulizer ok to use up to four times daily   Please schedule a follow up office visit in 8 weeks, call sooner if needed with pfts on return

## 2015-08-30 ENCOUNTER — Encounter: Payer: Self-pay | Admitting: Internal Medicine

## 2015-08-30 DIAGNOSIS — J449 Chronic obstructive pulmonary disease, unspecified: Secondary | ICD-10-CM | POA: Insufficient documentation

## 2015-08-30 NOTE — Assessment & Plan Note (Addendum)
Appears to be quite severe though hard to really judge as limited from walking chronically by non pulmonary factors but in any case her symptoms are very difficult to control and dz complicated by hypercabia and hypoxemia   DDX of  difficult airways management almost all start with A and  include Adherence, Ace Inhibitors, Acid Reflux, Active Sinus Disease, Alpha 1 Antitripsin deficiency, Anxiety masquerading as Airways dz,  ABPA,  Allergy(esp in young), Aspiration (esp in elderly), Adverse effects of meds,  Active smokers, A bunch of PE's (a small clot burden can't cause this syndrome unless there is already severe underlying pulm or vascular dz with poor reserve) plus two Bs  = Bronchiectasis and Beta blocker use..and one C= CHF   Adherence is always the initial "prime suspect" and is a multilayered concern that requires a "trust but verify" approach in every patient - starting with knowing how to use medications, especially inhalers, correctly, keeping up with refills and understanding the fundamental difference between maintenance and prns vs those medications only taken for a very short course and then stopped and not refilled.   ? Acid (or non-acid) GERD > always difficult to exclude as up to 75% of pts in some series report no assoc GI/ Heartburn symptoms> rec continue max (24h)  acid suppression and diet restrictions/ reviewed      ? Active smoking > denies/ reinforced impt of maint abstinence.  ? Beta blocker effect > try off corgard (see separate a/p)   I had an extended discussion with the patient reviewing all relevant studies completed to date and  lasting 85mnutes of a 60 minute visit    Each maintenance medication was reviewed in detail including most importantly the difference between maintenance and prns and under what circumstances the prns are to be triggered using an action plan format that is not reflected in the computer generated alphabetically organized AVS.    Please see  instructions for details which were reviewed in writing and the patient given a copy highlighting the part that I personally wrote and discussed at today's ov.

## 2015-08-30 NOTE — Assessment & Plan Note (Signed)
Strongly prefer in this setting: Bystolic, the most beta -1  selective Beta blocker available in sample form, with bisoprolol the most selective generic choice  on the market.   Try bisoprolol 10 mg daily until returns for pfts

## 2015-08-30 NOTE — Assessment & Plan Note (Signed)
02 3lpm 24/7 per Dr Lawanna Kobus - HC03  08/16/15 = 35   sats in low 90s acceptable in the setting of moderate hypercarbia

## 2015-09-04 NOTE — Progress Notes (Signed)
Cardiology Office Note   Date:  09/06/2015   ID:  MAHIKA VANVOORHIS, DOB 01-01-1946, MRN [id number]  PCP:  Alesia Richards, MD  Cardiologist:   Minus Breeding, MD   Chief Complaint  Patient presents with  . New Patient (Initial Visit)      History of Present Illness: [name] is a 70 y.o. female who presents for evaluation after hospitalization. She was admitted with generalized weakness felt to be related to dehydration. She had hypokalemia. She also had iatrogenic renal insufficiency having run out of chronic steroids that she had been taking. Her issues with hypovolemia had occurred after being treated with diuresis for increasing lower extremity edema.  She had been treated with doxycycline for possible cellulitis. Of note she likely has multifactorial edema. She does have cirrhosis with volume overload.  I am asked to see her because of evidence of an abnormal echocardiogram. She does have moderate left ventricular hypertrophy with well-preserved systolic function.  The patient has a low functional level. She is limited by back and joint pains. She gets around with a walker at home. She's on continuous oxygen. She's not describing chest pressure, PND or orthopnea. She's not describing palpitations, presyncope or syncope. She says her breathing has been slowly getting worse over the last 8 or 9 months. She did stop smoking last year. She just recently started reducing her salt intake. She does not drink an excessive amount of fluid. However, she does sit with her feet on the floor most of the time because she has difficulty keeping them raised with her back pain and immobility.   Past Medical History  Diagnosis Date  . Hx of shoulder replacement   . Hypertension   . Hyperlipidemia   . COPD (chronic obstructive pulmonary disease) ([place])   . Vitamin D deficiency   . IBS (irritable bowel syndrome)   . Fibromyalgia   . Hepatic cirrhosis [facility])     Past Surgical History    Procedure Laterality Date  . Hip arthroplasty Bilateral   . Shoulder surgery Right   . Back surgery    . Cholecystectomy    . Neck surgery    . Esophagogastroduodenoscopy  07/16/2012    Procedure: ESOPHAGOGASTRODUODENOSCOPY (EGD);  Surgeon: Inda Castle, MD;  Location: [facility];  Service: Endoscopy;  Laterality: N/A;  . Gastric varices banding  07/16/2012    Procedure: GASTRIC VARICES BANDING;  Surgeon: Inda Castle, MD;  Location: [facility] ENDOSCOPY;  Service: Endoscopy;  Laterality: N/A;     Current Outpatient Prescriptions  Medication Sig Dispense Refill  . acetaminophen-codeine (TYLENOL #3) 300-30 MG tablet TAKE ONE-HALF TO ONE TABLET BY MOUTH FOUR TIMES DAILY AS NEEDED 120 tablet 3  . allopurinol (ZYLOPRIM) 300 MG tablet 1/2-1 tablet daily for gout (Patient taking differently: Take 300 mg by mouth daily. ) 90 tablet 1  . ALPRAZolam (XANAX) 0.5 MG tablet 1/2-1 pil up to 3 times a day for anxiety (Patient taking differently: Take 0.25-5 mg by mouth 3 (three) times daily as needed for anxiety. ) 60 tablet 0  . amitriptyline (ELAVIL) 10 MG tablet Take 1 tablet (10 mg total) by mouth 3 (three) times daily as needed. (Patient taking differently: Take 10 mg by mouth 3 (three) times daily as needed for sleep. ) 270 tablet 1  . aspirin 81 MG tablet Take 81 mg by mouth daily.    . bisoprolol (ZEBETA) 10 MG tablet Take 1 tablet (10 mg total) by mouth daily. 30 tablet  11  . Cholecalciferol (VITAMIN D-3) 1000 UNITS CAPS Take 10,000 Units by mouth daily.     . citalopram (CELEXA) 40 MG tablet TAKE 1 TABLET BY MOUTH TWICE A  DAY FOR MOOD 60 tablet 3  . cyclobenzaprine (FLEXERIL) 10 MG tablet Take 1 tablet (10 mg total) by mouth 3 (three) times daily as needed for muscle spasms. 30 tablet 0  . ferrous sulfate 325 (65 FE) MG tablet TAKE 1 TABLET TWICE DAILY (Patient taking differently: TAKE 1 TABLET ONCE DAILY) 60 tablet 5  . furosemide (LASIX) 40 MG tablet Take 1-2 tablets (40-80 mg total) by  mouth 2 (two) times daily. 80 mg in am and 40 mg in pm 60 tablet 0  . gabapentin (NEURONTIN) 300 MG capsule take 1 capsule by mouth in the morning, 1 capsule in the afternoon and 1-3 capsules at night (Patient taking differently: Take 300 mg by mouth 3 (three) times daily. ) 120 capsule 2  . guaiFENesin (ROBITUSSIN) 100 MG/5ML liquid Take 5-10 mLs (100-200 mg total) by mouth every 4 (four) hours as needed for cough. 60 mL 0  . ipratropium (ATROVENT) 0.02 % nebulizer solution use 1 vial in nebulizer EVERY 4 HOURS AS NEEDED FOR WHEEZING OR SHORTNESS OF BREATH 75 mL 3  . Magnesium 250 MG TABS Take 250 mg by mouth daily at 6 (six) AM.    . metFORMIN (GLUCOPHAGE-XR) 500 MG 24 hr tablet TAKE 2 TABLETS BY MOUTH TWICE DAILY AFTER MEALS 360 tablet 3  . nystatin (MYCOSTATIN) 100000 UNIT/ML suspension Take 5 mLs (500,000 Units total) by mouth 4 (four) times daily. Swish and swallow 240 mL 1  . Omega-3 Fatty Acids (FISH OIL) 1000 MG CAPS Take 1,000 mg by mouth 2 (two) times daily.    . ondansetron (ZOFRAN) 4 MG tablet Take 1 tablet (4 mg total) by mouth daily as needed for nausea or vomiting. 30 tablet 1  . OVER THE COUNTER MEDICATION Take 1-2 tablets by mouth as needed (constipation). Natural Veg Laxative    . OXYGEN Inhale 3 L/min into the lungs continuous as needed. On exertion and at home    . tamoxifen (NOLVADEX) 20 MG tablet Take 1 tablet (20 mg total) by mouth daily. 90 tablet 2  . pantoprazole (PROTONIX) 40 MG tablet Take 1 tablet (40 mg total) by mouth 2 (two) times daily. 180 tablet 1  . potassium chloride (K-DUR) 10 MEQ tablet Take 2 tablets (20 mEq total) by mouth daily. 60 tablet 2  . spironolactone (ALDACTONE) 25 MG tablet TAKE 1 TABLET BY MOUTH THREE TIMES DAILY FOR FLUIDS AND SWELLING 90 tablet 1   No current facility-administered medications for this visit.    Allergies:   Atorvastatin; Diphenhydramine hcl (sleep); Gemfibrozil; Hydrocodone-acetaminophen; Loratadine; Lorazepam; Simvastatin;  Sulfamethoxazole; and Sulfonamide derivatives    Social History:  The patient  reports that she quit smoking about 10 months ago. Her smoking use included Cigarettes. She has a 50 pack-year smoking history. She has never used smokeless tobacco. She reports that she drinks alcohol. She reports that she does not use illicit drugs.   Family History:  The patient's family history includes Diabetes in her brother; Heart disease in her mother and sister. There is no history of Colon cancer.    ROS:  Please see the history of present illness.   Otherwise, review of systems are positive for none.   All other systems are reviewed and negative.    PHYSICAL EXAM: VS:  BP 102/58 mmHg  Pulse 60  Ht 5' 2.5" (1.588 m)  Wt 179 lb 9.6 oz (81.466 kg)  BMI 32.31 kg/m2 , BMI Body mass index is 32.31 kg/(m^2). GEN:  No distress, chronically ill-appearing  NECK:  No jugular venous distention at 90 degrees, waveform within normal limits, carotid upstroke brisk and symmetric, no bruits, no thyromegaly LYMPHATICS:  No cervical adenopathy LUNGS:  Clear to auscultation bilaterally BACK:  No CVA tenderness CHEST:  Unremarkable HEART:  S1 and S2 within normal limits, no S3, no S4, no clicks, no rubs, no murmurs ABD:  Positive bowel sounds normal in frequency in pitch, no bruits, no rebound, no guarding, unable to assess midline mass or bruit with the patient seated. EXT:  2 plus pulses throughout, moderate left greater than right edema, no cyanosis no clubbing SKIN:  No rashes no nodules NEURO:  Cranial nerves II through XII grossly intact, motor grossly intact throughout PSYCH:  Cognitively intact, oriented to person place and time   EKG:  EKG is not ordered today. The ekg ordered 1/1/17demonstrates NSR, RBBB QT prolonged, nonspecific ST T wave changes.     Recent Labs: 07/22/2015: TSH 3.144 07/26/2015: B Natriuretic Peptide 58.8 08/07/2015: Magnesium 2.2 08/08/2015: ALT 35 08/16/2015: BUN 26*; Creat 1.09*;  Hemoglobin 11.9*; Platelets 71*; Potassium 4.6; Sodium 139    Lipid Panel    Component Value Date/Time   CHOL 232* 07/22/2015 1218   TRIG 460* 07/22/2015 1218   HDL 29* 07/22/2015 1218   CHOLHDL 8.0* 07/22/2015 1218   VLDL NOT CALC 07/22/2015 1218   LDLCALC NOT CALC 07/22/2015 1218      Wt Readings from Last 3 Encounters:  09/05/15 179 lb 9.6 oz (81.466 kg)  08/29/15 180 lb 6.4 oz (81.829 kg)  08/16/15 179 lb 6.4 oz (81.375 kg)      Other studies Reviewed: Additional studies/ records that were reviewed today include: Extensive hospital records. Review of the above records demonstrates:  Please see elsewhere in the note.     ASSESSMENT AND PLAN:  LVH:  The patient does have some diastolic dysfunction which may contribute to some of her volume issues. However, this needs to be treated with conservative measures and we spent a long time talking about salt restriction, fluid restriction, compression stockings and keeping her feet elevated. The bigger concern is over diuresis which has been a problem before. I encouraged her to make sure she has daily weights.  EDEMA:   I gave her a prescription for a lift chair. She also was given the name of an outlet for compression stockings.  HYPOKALEMIA:  Her potassium was normal on recent check.  Continue current therapy.  COPD:  She saw Dr. Melvyn Novas and she was thought to have severe COPD.    QT PROLONGATION:  This is noted on the EKG.  The patient has no symptoms related to this.  This could be related to medications.  Magnesium at the time this was taken was normal.  No further QT prolonging medications should be used.   This can be followed with EKGs.  If she has any symptoms of presyncope/syncope further evaluation would be indicated.    Current medicines are reviewed at length with the patient today.  The patient does not have concerns regarding medicines.  The following changes have been made:  no change  Labs/ tests ordered today  include:    Orders Placed This Encounter  Procedures  . DME Other see comment     Disposition:   FU with me in six months.  Signed, Minus Breeding, MD  09/06/2015 8:24 AM    [place]   '

## 2015-09-05 ENCOUNTER — Other Ambulatory Visit: Payer: Self-pay | Admitting: Internal Medicine

## 2015-09-05 ENCOUNTER — Encounter: Payer: Self-pay | Admitting: Cardiology

## 2015-09-05 ENCOUNTER — Ambulatory Visit (INDEPENDENT_AMBULATORY_CARE_PROVIDER_SITE_OTHER): Payer: PPO | Admitting: Cardiology

## 2015-09-05 ENCOUNTER — Other Ambulatory Visit: Payer: Self-pay | Admitting: *Deleted

## 2015-09-05 VITALS — BP 102/58 | HR 60 | Ht 62.5 in | Wt 179.6 lb

## 2015-09-05 DIAGNOSIS — M549 Dorsalgia, unspecified: Secondary | ICD-10-CM

## 2015-09-05 MED ORDER — POTASSIUM CHLORIDE ER 10 MEQ PO TBCR: 20.0000 meq | EXTENDED_RELEASE_TABLET | Freq: Every day | ORAL | Status: DC

## 2015-09-05 NOTE — Patient Instructions (Addendum)
Your physician wants you to follow-up in: 6 months or sooner if needed.  You will receive a reminder letter in the mail two months in advance. If you don't receive a letter, please call our office to schedule the follow-up appointment.  How to Use Compression Stockings Compression stockings are elastic socks that squeeze the legs. They help to increase blood flow to the legs, decrease swelling in the legs, and reduce the chance of developing blood clots in the lower legs. Compression stockings are often used by people who:  Are recovering from surgery.  Have poor circulation in their legs.  Are prone to getting blood clots in their legs.  Have varicose veins.  Sit or stay in bed for long periods of time. HOW TO USE COMPRESSION STOCKINGS Before you put on your compression stockings:  Make sure that they are the correct size. If you do not know your size, ask your health care provider.  Make sure that they are clean, dry, and in good condition.  Check them for rips and tears. Do not put them on if they are ripped or torn. Put your stockings on first thing in the morning, before you get out of bed. Keep them on for as long as your health care provider advises. When you are wearing your stockings:  Keep them as smooth as possible. Do not allow them to bunch up. It is especially important to prevent the stockings from bunching up around your toes or behind your knees.  Do not roll the stockings downward and leave them rolled down. This can decrease blood flow to your leg.  Change them right away if they become wet or dirty. When you take off your stockings, inspect your legs and feet. Anything that does not seem normal may require medical attention. Look for:  Open sores.  Red spots.  Swelling. INFORMATION AND TIPS  Do not stop wearing your compression stockings without talking to your health care provider first.  Wash your stockings everyday with mild detergent in cold or warm  water. Do not use bleach. Air-dry your stockings or dry them in a clothes dryer on low heat.  Replace your stockings every 3-6 months.  If skin moisturizing is part of your treatment plan, apply lotion or cream at night so that your skin will be dry when you put on the stockings in the morning. It is harder to put the stockings on when you have lotion on your legs or feet. SEEK MEDICAL CARE IF: Remove your stockings and seek medical care if:  You have a feeling of pins and needles in your feet or legs.  You have any new changes in your skin.  You have skin lesions that are getting worse.  You have swelling or pain that is getting worse. SEEK IMMEDIATE MEDICAL CARE IF:  You have numbness or tingling in your lower legs that does not get better immediately after you take the stockings off.  Your toes or feet become cold and blue.  You develop open sores or red spots on your legs that do not go away.  You see or feel a warm spot on your leg.  You have new swelling or soreness in your leg.  You are short of breath or you have chest pain for no reason.  You have a rapid or irregular heartbeat.  You feel light-headed or dizzy.   This information is not intended to replace advice given to you by your health care provider. Make sure you  discuss any questions you have with your health care provider.   Document Released: 05/20/2009 Document Revised: 12/07/2014 Document Reviewed: 06/30/2014 Elsevier Interactive Patient Education [employer].

## 2015-09-13 ENCOUNTER — Ambulatory Visit (INDEPENDENT_AMBULATORY_CARE_PROVIDER_SITE_OTHER): Payer: PPO | Admitting: Internal Medicine

## 2015-09-13 ENCOUNTER — Encounter: Payer: Self-pay | Admitting: Internal Medicine

## 2015-09-13 VITALS — BP 120/70 | HR 77 | Temp 97.6°F | Resp 14 | Ht 62.5 in | Wt 185.8 lb

## 2015-09-13 DIAGNOSIS — R609 Edema, unspecified: Secondary | ICD-10-CM | POA: Diagnosis not present

## 2015-09-13 DIAGNOSIS — K7469 Other cirrhosis of liver: Secondary | ICD-10-CM

## 2015-09-13 DIAGNOSIS — E1122 Type 2 diabetes mellitus with diabetic chronic kidney disease: Secondary | ICD-10-CM | POA: Diagnosis not present

## 2015-09-13 DIAGNOSIS — I10 Essential (primary) hypertension: Secondary | ICD-10-CM

## 2015-09-13 DIAGNOSIS — Z79899 Other long term (current) drug therapy: Secondary | ICD-10-CM

## 2015-09-13 DIAGNOSIS — N183 Chronic kidney disease, stage 3 unspecified: Secondary | ICD-10-CM

## 2015-09-13 DIAGNOSIS — I509 Heart failure, unspecified: Secondary | ICD-10-CM

## 2015-09-13 MED ORDER — BISOPROLOL FUMARATE 10 MG PO TABS: 10.0000 mg | ORAL_TABLET | Freq: Every day | ORAL | Status: DC

## 2015-09-13 MED ORDER — METOLAZONE 5 MG PO TABS: ORAL_TABLET | ORAL | Status: DC

## 2015-09-13 NOTE — Progress Notes (Deleted)
Patient ID: [name], female   DOB: [birth year], 70 y.o.   MRN: [id number]

## 2015-09-13 NOTE — Progress Notes (Signed)
Subjective:    Patient ID: [name], female    DOB: 05-18-1946, 70 y.o.   MRN: [id number]  HPI  This unfortunate MWF with multifactorial edema due to NASH/Cirrhosis, Chronic combined R&L CHF and venous insufficiency with co-morbidities of HTN, T2_DM/CKD3, Esophageal varices and end stage O2 Dependent COPD w/PVH returns by spouse for 5# wt gain over the last several days. Patient recently had rescue hospitalization for iatrogenic dehydration & hypokalemia with an aggressive diuresis and husband not comprehending the significance of her rapid weight loss. And she ended in hospitalization for "gentle" rehydration.  Currently she denies Orthopnea, PND and she is on 3 L/M for her COPD ands per husb she is satting at about 93-95%.   Medication Sig  . acetaminophen-codeine (TYLENOL #3) 300-30 MG tablet TAKE ONE-HALF TO ONE TABLET BY MOUTH FOUR TIMES DAILY AS NEEDED  . allopurinol (ZYLOPRIM) 300 MG tablet 1/2-1 tablet daily for gout (Patient taking differently: Take 300 mg by mouth daily. )  . ALPRAZolam (XANAX) 0.5 MG tablet 1/2-1 pil up to 3 times a day for anxiety (Patient taking differently: Take 0.25-5 mg by mouth 3 (three) times daily as needed for anxiety. )  . amitriptyline (ELAVIL) 10 MG tablet Take 1 tablet (10 mg total) by mouth 3 (three) times daily as needed. (Patient taking differently: Take 10 mg by mouth 3 (three) times daily as needed for sleep. )  . aspirin 81 MG tablet Take 81 mg by mouth daily.  . Cholecalciferol (VITAMIN D-3) 1000 UNITS CAPS Take 10,000 Units by mouth daily.   . citalopram (CELEXA) 40 MG tablet TAKE 1 TABLET BY MOUTH TWICE A  DAY FOR MOOD  . cyclobenzaprine (FLEXERIL) 10 MG tablet Take 1 tablet (10 mg total) by mouth 3 (three) times daily as needed for muscle spasms.  . ferrous sulfate 325 (65 FE) MG tablet TAKE 1 TABLET TWICE DAILY (Patient taking differently: TAKE 1 TABLET ONCE DAILY)  . furosemide (LASIX) 40 MG tablet Take 1-2 tablets (40-80 mg total) by mouth  2 (two) times daily. 80 mg in am and 40 mg in pm  . gabapentin (NEURONTIN) 300 MG capsule take 1 capsule by mouth in the morning, 1 capsule in the afternoon and 1-3 capsules at night (Patient taking differently: Take 300 mg by mouth 3 (three) times daily. )  . ipratropium (ATROVENT) 0.02 % nebulizer solution use 1 vial in nebulizer EVERY 4 HOURS AS NEEDED FOR WHEEZING OR SHORTNESS OF BREATH  . Magnesium 250 MG TABS Take 250 mg by mouth daily at 6 (six) AM.  . metFORMIN (GLUCOPHAGE-XR) 500 MG 24 hr tablet TAKE 2 TABLETS BY MOUTH TWICE DAILY AFTER MEALS  . nystatin (MYCOSTATIN) 100000 UNIT/ML suspension Take 5 mLs (500,000 Units total) by mouth 4 (four) times daily. Swish and swallow  . Omega-3 Fatty Acids (FISH OIL) 1000 MG CAPS Take 1,000 mg by mouth 2 (two) times daily.  . ondansetron (ZOFRAN) 4 MG tablet Take 1 tablet (4 mg total) by mouth daily as needed for nausea or vomiting.  Marland Kitchen OVER THE COUNTER MEDICATION Take 1-2 tablets by mouth as needed (constipation). Natural Veg Laxative  . OXYGEN Inhale 3 L/min into the lungs continuous as needed. On exertion and at home  . pantoprazole (PROTONIX) 40 MG tablet Take 1 tablet (40 mg total) by mouth 2 (two) times daily.  . potassium chloride (K-DUR) 10 MEQ tablet Take 2 tablets (20 mEq total) by mouth daily.  Marland Kitchen spironolactone (ALDACTONE) 25 MG tablet TAKE 1 TABLET  BY MOUTH THREE TIMES DAILY FOR FLUIDS AND SWELLING  . tamoxifen (NOLVADEX) 20 MG tablet Take 1 tablet (20 mg total) by mouth daily.  . bisoprolol (ZEBETA) 10 MG tablet Take 1 tablet (10 mg total) by mouth daily.  Marland Kitchen guaiFENesin (ROBITUSSIN) 100 MG/5ML liquid Take 5-10 mLs (100-200 mg total) by mouth every 4 (four) hours as needed for cough. (Patient not taking: Reported on 09/13/2015)   Allergies  Allergen Reactions  . Atorvastatin Other (See Comments)    Doesn't remember  . Diphenhydramine Hcl (Sleep) Hives  . Gemfibrozil Other (See Comments)    Doesn't remember  . Hydrocodone-Acetaminophen  Other (See Comments)    Doesn't remember  . Loratadine Hives  . Lorazepam Hives  . Simvastatin   . Sulfamethoxazole Hives  . Sulfonamide Derivatives    Past Medical History  Diagnosis Date  . Hx of shoulder replacement   . Hypertension   . Hyperlipidemia   . COPD (chronic obstructive pulmonary disease) ([place])   . Vitamin D deficiency   . IBS (irritable bowel syndrome)   . Fibromyalgia   . Hepatic cirrhosis [facility])    Past Surgical History  Procedure Laterality Date  . Hip arthroplasty Bilateral   . Shoulder surgery Right   . Back surgery    . Cholecystectomy    . Neck surgery    . Esophagogastroduodenoscopy  07/16/2012    Procedure: ESOPHAGOGASTRODUODENOSCOPY (EGD);  Surgeon: Inda Castle, MD;  Location: [facility];  Service: Endoscopy;  Laterality: N/A;  . Gastric varices banding  07/16/2012    Procedure: GASTRIC VARICES BANDING;  Surgeon: Inda Castle, MD;  Location: [facility] ENDOSCOPY;  Service: Endoscopy;  Laterality: N/A;   Review of Systems  10 point systems review negative except as above.    Objective:   Physical Exam  BP 120/70 mmHg  Pulse 77  Temp(Src) 97.6 F (36.4 C)  Resp 14  Ht 5' 2.5" (1.588 m)  Wt 185 lb 12.8 oz (84.278 kg)  BMI 33.42 kg/m2  SpO2 98%  In No Distress in w/C on 3 Lit O2.  HEENT - Eac's patent. TM's Nl. EOM's full. PERRLA. NasoOroPharynx clear. Neck - supple. Nl Thyroid. Carotids 2+ & No bruits, nodes, JVD Chest - BS decreased w/o RRW. No respiratory distress.  Cor - Nl HS. RRR w/o sig MGR. PP obscured by 2-3 + pretibial edema to the prox shins bialy. No calf tenderness.  Abd - Rotund & distended vs "Doughy". Soft w/o tenderness. BS active.  MS- FROM w/o deformities.  Neuro - No obvious Cr N abnormalities. Sensory, motor and Cerebellar functions appear Nl w/o focal abnormalities. Psyche - Mental status dullede.  No psychosis.    Assessment & Plan:   1. Essential hypertension   2. Edema, unspecified type  - metolazone  (ZAROXOLYN) 5 MG tablet; Take daily with lasix or as directed for fluid over load  Dispense: 30 tablet; Refill: 0  - recc start 1 tablet today , then 1/2 tab qod  And weighing daily and withholding Zaroxolyn if drop over 2# in 24 hr period.   3. CKD stage 3 due to type 2 diabetes mellitus ([place])   4. Other cirrhosis of liver ([place])   5. Congestive heart failure, unspecified congestive heart failure chronicity, unspecified congestive heart failure type ([place])   6. Medication management  - CBC with Differential/Platelet - BASIC METABOLIC PANEL WITH GFR  - Discussed meds/SE's.   - ROV 1 week to recheck labs.

## 2015-09-13 NOTE — Patient Instructions (Signed)
Take 1 whole Zaroxolyn today,   then take 1/2 tablet every other day   and see back in office 1 week next Wednesday

## 2015-09-14 LAB — BASIC METABOLIC PANEL WITH GFR
BUN: 15 mg/dL (ref 7–25)
CALCIUM: 9 mg/dL (ref 8.6–10.4)
CO2: 29 mmol/L (ref 20–31)
CREATININE: 1.13 mg/dL — AB (ref 0.50–0.99)
Chloride: 96 mmol/L — ABNORMAL LOW (ref 98–110)
GFR, EST AFRICAN AMERICAN: 58 mL/min — AB (ref >=60)
GFR, Est Non African American: 50 mL/min — ABNORMAL LOW (ref >=60)
Glucose, Bld: 148 mg/dL — ABNORMAL HIGH (ref 65–99)
Potassium: 4 mmol/L (ref 3.5–5.3)
SODIUM: 137 mmol/L (ref 135–146)

## 2015-09-14 LAB — CBC WITH DIFFERENTIAL/PLATELET
BASOS PCT: 1 % (ref 0–1)
Basophils Absolute: 0.1 10*3/uL (ref 0.0–0.1)
EOS ABS: 0.3 10*3/uL (ref 0.0–0.7)
Eosinophils Relative: 6 % — ABNORMAL HIGH (ref 0–5)
HCT: 30.7 % — ABNORMAL LOW (ref 36.0–46.0)
Hemoglobin: 10 g/dL — ABNORMAL LOW (ref 12.0–15.0)
Lymphocytes Relative: 22 % (ref 12–46)
Lymphs Abs: 1.1 10*3/uL (ref 0.7–4.0)
MCH: 32.5 pg (ref 26.0–34.0)
MCHC: 32.6 g/dL (ref 30.0–36.0)
MCV: 99.7 fL (ref 78.0–100.0)
MONO ABS: 0.5 10*3/uL (ref 0.1–1.0)
MONOS PCT: 9 % (ref 3–12)
MPV: 11.2 fL (ref 8.6–12.4)
NEUTROS PCT: 62 % (ref 43–77)
Neutro Abs: 3.2 10*3/uL (ref 1.7–7.7)
PLATELETS: 98 10*3/uL — AB (ref 150–400)
RBC: 3.08 MIL/uL — ABNORMAL LOW (ref 3.87–5.11)
RDW: 15.7 % — AB (ref 11.5–15.5)
WBC: 5.2 10*3/uL (ref 4.0–10.5)

## 2015-09-15 ENCOUNTER — Other Ambulatory Visit: Payer: Self-pay

## 2015-09-15 NOTE — Patient Outreach (Signed)
[facility]) Care Management  09/15/2015  [name] 1945/11/23 [id number]   Telephone Screen  Referral Date: 09/14/15 Referral Source: [place](HTA) Referral Reason: "RN for education/support. Patient has dx of CHF, recent wgt gain r/t diet. Spouse stated patient drinking V8 and saltine crackers daily. Hx of HTN, COPD, CKD and cirrhosis of liver"   Outreach attempt # 1 to patient. Spoke with spouse who reported patient was still asleep. He states that its best to reach patient in the afternoons.   Plan: RN CM will make outreach attempt to patient within a week.  Enzo Montgomery, RN,BSN,CCM [facility] Telephonic Care Management Coordinator Direct Phone: [phone] Toll Free: [phone] Fax: [phone]

## 2015-09-20 ENCOUNTER — Ambulatory Visit: Payer: Self-pay | Admitting: Internal Medicine

## 2015-09-20 DIAGNOSIS — J449 Chronic obstructive pulmonary disease, unspecified: Secondary | ICD-10-CM | POA: Diagnosis not present

## 2015-09-21 ENCOUNTER — Ambulatory Visit: Payer: Self-pay | Admitting: Internal Medicine

## 2015-09-21 ENCOUNTER — Inpatient Hospital Stay (HOSPITAL_COMMUNITY): Payer: PPO

## 2015-09-21 ENCOUNTER — Emergency Department (HOSPITAL_COMMUNITY): Payer: PPO

## 2015-09-21 ENCOUNTER — Inpatient Hospital Stay (HOSPITAL_COMMUNITY)
Admission: EM | Admit: 2015-09-21 | Discharge: 2015-09-24 | DRG: 442 | Disposition: A | Discharge status: Home / Self Care | Payer: PPO | Attending: Internal Medicine | Admitting: Internal Medicine

## 2015-09-21 ENCOUNTER — Encounter (HOSPITAL_COMMUNITY): Payer: Self-pay | Admitting: Family Medicine

## 2015-09-21 DIAGNOSIS — I8510 Secondary esophageal varices without bleeding: Secondary | ICD-10-CM

## 2015-09-21 DIAGNOSIS — R296 Repeated falls: Secondary | ICD-10-CM | POA: Diagnosis present

## 2015-09-21 DIAGNOSIS — K589 Irritable bowel syndrome without diarrhea: Secondary | ICD-10-CM | POA: Diagnosis present

## 2015-09-21 DIAGNOSIS — J449 Chronic obstructive pulmonary disease, unspecified: Secondary | ICD-10-CM | POA: Diagnosis not present

## 2015-09-21 DIAGNOSIS — E860 Dehydration: Secondary | ICD-10-CM | POA: Diagnosis present

## 2015-09-21 DIAGNOSIS — D6959 Other secondary thrombocytopenia: Secondary | ICD-10-CM | POA: Diagnosis not present

## 2015-09-21 DIAGNOSIS — Z96643 Presence of artificial hip joint, bilateral: Secondary | ICD-10-CM | POA: Diagnosis present

## 2015-09-21 DIAGNOSIS — N182 Chronic kidney disease, stage 2 (mild): Secondary | ICD-10-CM | POA: Diagnosis present

## 2015-09-21 DIAGNOSIS — K7460 Unspecified cirrhosis of liver: Secondary | ICD-10-CM | POA: Diagnosis present

## 2015-09-21 DIAGNOSIS — Z7982 Long term (current) use of aspirin: Secondary | ICD-10-CM | POA: Diagnosis not present

## 2015-09-21 DIAGNOSIS — K7682 Hepatic encephalopathy: Secondary | ICD-10-CM | POA: Diagnosis present

## 2015-09-21 DIAGNOSIS — J9612 Chronic respiratory failure with hypercapnia: Secondary | ICD-10-CM

## 2015-09-21 DIAGNOSIS — R630 Anorexia: Secondary | ICD-10-CM | POA: Diagnosis not present

## 2015-09-21 DIAGNOSIS — J9610 Chronic respiratory failure, unspecified whether with hypoxia or hypercapnia: Secondary | ICD-10-CM | POA: Diagnosis present

## 2015-09-21 DIAGNOSIS — J9611 Chronic respiratory failure with hypoxia: Secondary | ICD-10-CM

## 2015-09-21 DIAGNOSIS — R278 Other lack of coordination: Secondary | ICD-10-CM | POA: Diagnosis present

## 2015-09-21 DIAGNOSIS — Z79899 Other long term (current) drug therapy: Secondary | ICD-10-CM | POA: Diagnosis not present

## 2015-09-21 DIAGNOSIS — N179 Acute kidney failure, unspecified: Secondary | ICD-10-CM | POA: Diagnosis present

## 2015-09-21 DIAGNOSIS — M109 Gout, unspecified: Secondary | ICD-10-CM | POA: Diagnosis not present

## 2015-09-21 DIAGNOSIS — E1122 Type 2 diabetes mellitus with diabetic chronic kidney disease: Secondary | ICD-10-CM | POA: Diagnosis not present

## 2015-09-21 DIAGNOSIS — E876 Hypokalemia: Secondary | ICD-10-CM | POA: Diagnosis not present

## 2015-09-21 DIAGNOSIS — Z87891 Personal history of nicotine dependence: Secondary | ICD-10-CM | POA: Diagnosis not present

## 2015-09-21 DIAGNOSIS — I5032 Chronic diastolic (congestive) heart failure: Secondary | ICD-10-CM | POA: Diagnosis not present

## 2015-09-21 DIAGNOSIS — Z7984 Long term (current) use of oral hypoglycemic drugs: Secondary | ICD-10-CM | POA: Diagnosis not present

## 2015-09-21 DIAGNOSIS — S0990XA Unspecified injury of head, initial encounter: Secondary | ICD-10-CM | POA: Diagnosis not present

## 2015-09-21 DIAGNOSIS — J42 Unspecified chronic bronchitis: Secondary | ICD-10-CM

## 2015-09-21 DIAGNOSIS — E785 Hyperlipidemia, unspecified: Secondary | ICD-10-CM | POA: Diagnosis present

## 2015-09-21 DIAGNOSIS — Z885 Allergy status to narcotic agent status: Secondary | ICD-10-CM | POA: Diagnosis not present

## 2015-09-21 DIAGNOSIS — N183 Chronic kidney disease, stage 3 unspecified: Secondary | ICD-10-CM | POA: Diagnosis present

## 2015-09-21 DIAGNOSIS — Z882 Allergy status to sulfonamides status: Secondary | ICD-10-CM | POA: Diagnosis not present

## 2015-09-21 DIAGNOSIS — E872 Acidosis: Secondary | ICD-10-CM | POA: Diagnosis present

## 2015-09-21 DIAGNOSIS — K7581 Nonalcoholic steatohepatitis (NASH): Secondary | ICD-10-CM | POA: Diagnosis not present

## 2015-09-21 DIAGNOSIS — I4510 Unspecified right bundle-branch block: Secondary | ICD-10-CM | POA: Diagnosis not present

## 2015-09-21 DIAGNOSIS — Z9981 Dependence on supplemental oxygen: Secondary | ICD-10-CM | POA: Diagnosis not present

## 2015-09-21 DIAGNOSIS — E1165 Type 2 diabetes mellitus with hyperglycemia: Secondary | ICD-10-CM | POA: Diagnosis present

## 2015-09-21 DIAGNOSIS — E669 Obesity, unspecified: Secondary | ICD-10-CM

## 2015-09-21 DIAGNOSIS — I130 Hypertensive heart and chronic kidney disease with heart failure and stage 1 through stage 4 chronic kidney disease, or unspecified chronic kidney disease: Secondary | ICD-10-CM | POA: Diagnosis not present

## 2015-09-21 DIAGNOSIS — K7290 Hepatic failure, unspecified without coma: Secondary | ICD-10-CM | POA: Diagnosis not present

## 2015-09-21 DIAGNOSIS — Z888 Allergy status to other drugs, medicaments and biological substances status: Secondary | ICD-10-CM | POA: Diagnosis not present

## 2015-09-21 DIAGNOSIS — K7469 Other cirrhosis of liver: Secondary | ICD-10-CM

## 2015-09-21 DIAGNOSIS — D696 Thrombocytopenia, unspecified: Secondary | ICD-10-CM | POA: Diagnosis present

## 2015-09-21 DIAGNOSIS — D539 Nutritional anemia, unspecified: Secondary | ICD-10-CM | POA: Diagnosis present

## 2015-09-21 DIAGNOSIS — Z0389 Encounter for observation for other suspected diseases and conditions ruled out: Secondary | ICD-10-CM | POA: Diagnosis not present

## 2015-09-21 DIAGNOSIS — R1084 Generalized abdominal pain: Secondary | ICD-10-CM | POA: Diagnosis not present

## 2015-09-21 DIAGNOSIS — D649 Anemia, unspecified: Secondary | ICD-10-CM | POA: Diagnosis present

## 2015-09-21 DIAGNOSIS — R188 Other ascites: Secondary | ICD-10-CM

## 2015-09-21 DIAGNOSIS — J9621 Acute and chronic respiratory failure with hypoxia: Secondary | ICD-10-CM | POA: Diagnosis present

## 2015-09-21 DIAGNOSIS — I10 Essential (primary) hypertension: Secondary | ICD-10-CM | POA: Diagnosis present

## 2015-09-21 DIAGNOSIS — E559 Vitamin D deficiency, unspecified: Secondary | ICD-10-CM | POA: Diagnosis present

## 2015-09-21 DIAGNOSIS — R531 Weakness: Secondary | ICD-10-CM | POA: Diagnosis not present

## 2015-09-21 DIAGNOSIS — M797 Fibromyalgia: Secondary | ICD-10-CM | POA: Diagnosis not present

## 2015-09-21 DIAGNOSIS — I8500 Esophageal varices without bleeding: Secondary | ICD-10-CM | POA: Diagnosis present

## 2015-09-21 LAB — GLUCOSE, CAPILLARY: Glucose-Capillary: 192 mg/dL — ABNORMAL HIGH (ref 65–99)

## 2015-09-21 LAB — I-STAT ARTERIAL BLOOD GAS, ED
ACID-BASE EXCESS: 16 mmol/L — AB (ref 0.0–2.0)
BICARBONATE: 41 meq/L — AB (ref 20.0–24.0)
O2 SAT: 98 %
PO2 ART: 106 mmHg — AB (ref 80.0–100.0)
TCO2: 43 mmol/L (ref 0–100)
pCO2 arterial: 52.5 mmHg — ABNORMAL HIGH (ref 35.0–45.0)
pH, Arterial: 7.501 — ABNORMAL HIGH (ref 7.350–7.450)

## 2015-09-21 LAB — URINALYSIS, ROUTINE W REFLEX MICROSCOPIC
Bilirubin Urine: NEGATIVE
GLUCOSE, UA: NEGATIVE mg/dL
Hgb urine dipstick: NEGATIVE
Ketones, ur: NEGATIVE mg/dL
LEUKOCYTES UA: NEGATIVE
NITRITE: NEGATIVE
PH: 7 (ref 5.0–8.0)
Protein, ur: NEGATIVE mg/dL
SPECIFIC GRAVITY, URINE: 1.009 (ref 1.005–1.030)

## 2015-09-21 LAB — COMPREHENSIVE METABOLIC PANEL
ALBUMIN: 3.4 g/dL — AB (ref 3.5–5.0)
ALT: 19 U/L (ref 14–54)
ANION GAP: 18 — AB (ref 5–15)
AST: 34 U/L (ref 15–41)
Alkaline Phosphatase: 87 U/L (ref 38–126)
BILIRUBIN TOTAL: 1 mg/dL (ref 0.3–1.2)
BUN: 27 mg/dL — ABNORMAL HIGH (ref 6–20)
CHLORIDE: 89 mmol/L — AB (ref 101–111)
CO2: 31 mmol/L (ref 22–32)
Calcium: 9.6 mg/dL (ref 8.9–10.3)
Creatinine, Ser: 1.52 mg/dL — ABNORMAL HIGH (ref 0.44–1.00)
GFR calc Af Amer: 40 mL/min — ABNORMAL LOW (ref >60)
GFR calc non Af Amer: 34 mL/min — ABNORMAL LOW (ref >60)
GLUCOSE: 198 mg/dL — AB (ref 65–99)
POTASSIUM: 3.3 mmol/L — AB (ref 3.5–5.1)
Sodium: 138 mmol/L (ref 135–145)
TOTAL PROTEIN: 5.7 g/dL — AB (ref 6.5–8.1)

## 2015-09-21 LAB — ACETAMINOPHEN LEVEL: Acetaminophen (Tylenol), Serum: <10 ug/mL — ABNORMAL LOW (ref 10–30)

## 2015-09-21 LAB — CBC
HEMATOCRIT: 32.5 % — AB (ref 36.0–46.0)
HEMOGLOBIN: 10.2 g/dL — AB (ref 12.0–15.0)
MCH: 31.9 pg (ref 26.0–34.0)
MCHC: 31.4 g/dL (ref 30.0–36.0)
MCV: 101.6 fL — ABNORMAL HIGH (ref 78.0–100.0)
Platelets: 97 10*3/uL — ABNORMAL LOW (ref 150–400)
RBC: 3.2 MIL/uL — ABNORMAL LOW (ref 3.87–5.11)
RDW: 15.8 % — ABNORMAL HIGH (ref 11.5–15.5)
WBC: 6 10*3/uL (ref 4.0–10.5)

## 2015-09-21 LAB — I-STAT CG4 LACTIC ACID, ED
LACTIC ACID, VENOUS: 3.04 mmol/L — AB (ref 0.5–2.0)
Lactic Acid, Venous: 2.36 mmol/L (ref 0.5–2.0)

## 2015-09-21 LAB — SALICYLATE LEVEL: Salicylate Lvl: <4 mg/dL (ref 2.8–30.0)

## 2015-09-21 LAB — BRAIN NATRIURETIC PEPTIDE: B NATRIURETIC PEPTIDE 5: 27.1 pg/mL (ref 0.0–100.0)

## 2015-09-21 LAB — AMMONIA: Ammonia: 83 umol/L — ABNORMAL HIGH (ref 9–35)

## 2015-09-21 LAB — LIPASE, BLOOD: LIPASE: 37 U/L (ref 11–51)

## 2015-09-21 LAB — TROPONIN I

## 2015-09-21 LAB — ETHANOL

## 2015-09-21 MED ORDER — POTASSIUM CHLORIDE CRYS ER 20 MEQ PO TBCR
20.0000 meq | EXTENDED_RELEASE_TABLET | Freq: Two times a day (BID) | ORAL | Status: DC
  Administered 2015-09-21: 20 meq via ORAL
  Filled 2015-09-21: qty 1

## 2015-09-21 MED ORDER — ALPRAZOLAM 0.25 MG PO TABS
0.2500 mg | ORAL_TABLET | Freq: Three times a day (TID) | ORAL | Status: DC | PRN
  Administered 2015-09-22: 0.5 mg via ORAL
  Administered 2015-09-23: 0.25 mg via ORAL
  Filled 2015-09-21: qty 2
  Filled 2015-09-21: qty 1

## 2015-09-21 MED ORDER — SODIUM CHLORIDE 0.9 % IV BOLUS (SEPSIS)
500.0000 mL | Freq: Once | INTRAVENOUS | Status: AC
  Administered 2015-09-21: 500 mL via INTRAVENOUS

## 2015-09-21 MED ORDER — ALLOPURINOL 300 MG PO TABS
300.0000 mg | ORAL_TABLET | Freq: Every day | ORAL | Status: DC
  Administered 2015-09-22 – 2015-09-24 (×3): 300 mg via ORAL
  Filled 2015-09-21 (×3): qty 1

## 2015-09-21 MED ORDER — INSULIN ASPART 100 UNIT/ML ~~LOC~~ SOLN
0.0000 [IU] | Freq: Every day | SUBCUTANEOUS | Status: DC
  Administered 2015-09-22: 3 [IU] via SUBCUTANEOUS
  Administered 2015-09-23: 2 [IU] via SUBCUTANEOUS

## 2015-09-21 MED ORDER — TAMOXIFEN CITRATE 10 MG PO TABS
20.0000 mg | ORAL_TABLET | Freq: Every day | ORAL | Status: DC
  Administered 2015-09-22 – 2015-09-24 (×3): 20 mg via ORAL
  Filled 2015-09-21 (×3): qty 2

## 2015-09-21 MED ORDER — NALOXONE HCL 0.4 MG/ML IJ SOLN
0.4000 mg | Freq: Once | INTRAMUSCULAR | Status: DC
Start: 2015-09-21 — End: 2015-09-21

## 2015-09-21 MED ORDER — ALBUTEROL SULFATE (2.5 MG/3ML) 0.083% IN NEBU: 2.5000 mg | INHALATION_SOLUTION | RESPIRATORY_TRACT | Status: DC | PRN

## 2015-09-21 MED ORDER — ASPIRIN 81 MG PO CHEW
81.0000 mg | CHEWABLE_TABLET | Freq: Every day | ORAL | Status: DC
  Administered 2015-09-21 – 2015-09-24 (×4): 81 mg via ORAL
  Filled 2015-09-21 (×4): qty 1

## 2015-09-21 MED ORDER — LACTULOSE 10 GM/15ML PO SOLN
30.0000 g | Freq: Once | ORAL | Status: AC
  Administered 2015-09-21: 30 g via ORAL
  Filled 2015-09-21: qty 45

## 2015-09-21 MED ORDER — LACTULOSE 10 GM/15ML PO SOLN
20.0000 g | ORAL | Status: DC
  Administered 2015-09-21 – 2015-09-22 (×4): 20 g via ORAL
  Filled 2015-09-21 (×4): qty 30

## 2015-09-21 MED ORDER — PANTOPRAZOLE SODIUM 40 MG PO TBEC
40.0000 mg | DELAYED_RELEASE_TABLET | Freq: Two times a day (BID) | ORAL | Status: DC
  Administered 2015-09-22 – 2015-09-24 (×5): 40 mg via ORAL
  Filled 2015-09-21 (×5): qty 1

## 2015-09-21 MED ORDER — CITALOPRAM HYDROBROMIDE 20 MG PO TABS
40.0000 mg | ORAL_TABLET | Freq: Every day | ORAL | Status: DC
  Administered 2015-09-21 – 2015-09-23 (×3): 40 mg via ORAL
  Filled 2015-09-21 (×3): qty 2

## 2015-09-21 MED ORDER — BISOPROLOL FUMARATE 5 MG PO TABS
10.0000 mg | ORAL_TABLET | Freq: Every day | ORAL | Status: DC
  Administered 2015-09-21 – 2015-09-24 (×4): 10 mg via ORAL
  Filled 2015-09-21 (×4): qty 2

## 2015-09-21 MED ORDER — FERROUS SULFATE 325 (65 FE) MG PO TABS
325.0000 mg | ORAL_TABLET | Freq: Two times a day (BID) | ORAL | Status: DC
  Administered 2015-09-21 – 2015-09-24 (×6): 325 mg via ORAL
  Filled 2015-09-21 (×6): qty 1

## 2015-09-21 MED ORDER — GABAPENTIN 300 MG PO CAPS
300.0000 mg | ORAL_CAPSULE | Freq: Three times a day (TID) | ORAL | Status: DC
  Administered 2015-09-21 – 2015-09-24 (×8): 300 mg via ORAL
  Filled 2015-09-21 (×8): qty 1

## 2015-09-21 MED ORDER — ACETAMINOPHEN-CODEINE #3 300-30 MG PO TABS
0.5000 | ORAL_TABLET | Freq: Four times a day (QID) | ORAL | Status: DC | PRN
  Administered 2015-09-22 – 2015-09-24 (×4): 1 via ORAL
  Filled 2015-09-21 (×4): qty 1

## 2015-09-21 MED ORDER — INSULIN ASPART 100 UNIT/ML ~~LOC~~ SOLN
0.0000 [IU] | Freq: Three times a day (TID) | SUBCUTANEOUS | Status: DC
  Administered 2015-09-22: 2 [IU] via SUBCUTANEOUS
  Administered 2015-09-22 (×2): 1 [IU] via SUBCUTANEOUS
  Administered 2015-09-23: 2 [IU] via SUBCUTANEOUS
  Administered 2015-09-23: 1 [IU] via SUBCUTANEOUS
  Administered 2015-09-23: 3 [IU] via SUBCUTANEOUS
  Administered 2015-09-24: 5 [IU] via SUBCUTANEOUS

## 2015-09-21 MED ORDER — SPIRONOLACTONE 25 MG PO TABS: 25.0000 mg | ORAL_TABLET | Freq: Every day | ORAL | Status: DC

## 2015-09-21 MED ORDER — FUROSEMIDE 40 MG PO TABS: 40.0000 mg | ORAL_TABLET | Freq: Every day | ORAL | Status: DC

## 2015-09-21 MED ORDER — AMITRIPTYLINE HCL 10 MG PO TABS
10.0000 mg | ORAL_TABLET | Freq: Two times a day (BID) | ORAL | Status: DC
  Administered 2015-09-21 – 2015-09-24 (×6): 10 mg via ORAL
  Filled 2015-09-21 (×6): qty 1

## 2015-09-21 MED ORDER — SODIUM CHLORIDE 0.9 % IV SOLN
INTRAVENOUS | Status: DC
  Administered 2015-09-21: 20:00:00 via INTRAVENOUS

## 2015-09-21 NOTE — ED Provider Notes (Signed)
CSN: [id number]     Arrival date & time 09/21/15  1150 History   First MD Initiated Contact with Patient 09/21/15 1540     Chief Complaint  Patient presents with  . Weakness  . Fall     (Consider location/radiation/quality/duration/timing/severity/associated sxs/prior Treatment) HPI Comments: Level 5 caveat for altered mental status. Patient from home with 3 day history of generalized weakness, recurrent falls, "legs giving out". Also with nausea and increased confusion. Husband at bedside states patient has been more confused over the past several days with frequent falls due to leg weakness. Denies hitting her head or losing consciousness. She does not take any blood thinners. Patient has a history of nonalcoholic cirrhosis and states compliance with her medications. She has diffuse abdominal pain which is chronic. They states she's never had a paracentesis. No fever, chills or vomiting. She does have nausea and a poor appetite. No diarrhea, chest pain. Her shortness of breath is at baseline on her home 3 L. No cough. She has taken some Tylenol with codeine but no other pain medication.  Patient is a 70 y.o. female presenting with fall. The history is provided by the patient.  Fall    Past Medical History  Diagnosis Date  . Hx of shoulder replacement   . Hypertension   . Hyperlipidemia   . COPD (chronic obstructive pulmonary disease) ([place])   . Vitamin D deficiency   . IBS (irritable bowel syndrome)   . Fibromyalgia   . Hepatic cirrhosis [facility])    Past Surgical History  Procedure Laterality Date  . Hip arthroplasty Bilateral   . Shoulder surgery Right   . Back surgery    . Cholecystectomy    . Neck surgery    . Esophagogastroduodenoscopy  07/16/2012    Procedure: ESOPHAGOGASTRODUODENOSCOPY (EGD);  Surgeon: Inda Castle, MD;  Location: [facility];  Service: Endoscopy;  Laterality: N/A;  . Gastric varices banding  07/16/2012    Procedure: GASTRIC VARICES BANDING;  Surgeon:  Inda Castle, MD;  Location: [facility];  Service: Endoscopy;  Laterality: N/A;   Family History  Problem Relation Age of Onset  . Colon cancer Neg Hx   . Diabetes Brother   . Heart disease Sister     A Fib  . Heart disease Mother     CHF   Social History  Substance Use Topics  . Smoking status: Former Smoker -- 1.00 packs/day for 50 years    Types: Cigarettes    Quit date: 11/05/2014  . Smokeless tobacco: Never Used     Comment: Quit April 2016  . Alcohol Use: 0.0 oz/week    0 Standard drinks or equivalent per week     Comment: rarely   OB History    No data available     Review of Systems  Unable to perform ROS: Mental status change  Neurological: Positive for weakness.      Allergies  Atorvastatin; Diphenhydramine hcl (sleep); Gemfibrozil; Hydrocodone-acetaminophen; Loratadine; Lorazepam; Simvastatin; Sulfamethoxazole; and Sulfonamide derivatives  Home Medications   Prior to Admission medications   Medication Sig Start Date End Date Taking? Authorizing Provider  acetaminophen-codeine (TYLENOL #3) 300-30 MG tablet TAKE ONE-HALF TO ONE TABLET BY MOUTH FOUR TIMES DAILY AS NEEDED 05/26/15  Yes Unk Pinto, MD  allopurinol (ZYLOPRIM) 300 MG tablet 1/2-1 tablet daily for gout Patient taking differently: Take 300 mg by mouth daily.  03/03/15 03/02/18 Yes Unk Pinto, MD  ALPRAZolam Duanne Moron) 0.5 MG tablet 1/2-1 pil up to 3 times a  day for anxiety Patient taking differently: Take 0.25-5 mg by mouth 3 (three) times daily as needed for anxiety.  07/28/15  Yes Vicie Mutters, PA-C  amitriptyline (ELAVIL) 10 MG tablet Take 1 tablet (10 mg total) by mouth 3 (three) times daily as needed. Patient taking differently: Take 10 mg by mouth 2 (two) times daily.  06/23/15  Yes Unk Pinto, MD  aspirin 81 MG tablet Take 81 mg by mouth daily.   Yes Historical Provider, MD  bisoprolol (ZEBETA) 10 MG tablet Take 1 tablet (10 mg total) by mouth daily. 09/13/15  Yes Unk Pinto, MD  Cholecalciferol 5000 units capsule Take 10,000 Units by mouth daily.   Yes Historical Provider, MD  citalopram (CELEXA) 40 MG tablet TAKE 1 TABLET BY MOUTH TWICE A  DAY FOR MOOD 05/23/15  Yes Unk Pinto, MD  cyclobenzaprine (FLEXERIL) 10 MG tablet Take 1 tablet (10 mg total) by mouth 3 (three) times daily as needed for muscle spasms. 11/17/14  Yes Courtney Forcucci, PA-C  ferrous sulfate 325 (65 FE) MG tablet TAKE 1 TABLET TWICE DAILY Patient taking differently: TAKE 1 TABLET ONCE DAILY 01/19/13  Yes Inda Castle, MD  furosemide (LASIX) 40 MG tablet Take 1-2 tablets (40-80 mg total) by mouth 2 (two) times daily. 80 mg in am and 40 mg in pm Patient taking differently: Take 80 mg by mouth 2 (two) times daily.  08/09/15  Yes Costin Karlyne Greenspan, MD  gabapentin (NEURONTIN) 300 MG capsule take 1 capsule by mouth in the morning, 1 capsule in the afternoon and 1-3 capsules at night Patient taking differently: Take 300 mg by mouth 3 (three) times daily.  07/27/15  Yes Vicie Mutters, PA-C  guaiFENesin (ROBITUSSIN) 100 MG/5ML liquid Take 5-10 mLs (100-200 mg total) by mouth every 4 (four) hours as needed for cough. 07/26/15  Yes Shary Decamp, PA-C  ipratropium (ATROVENT) 0.02 % nebulizer solution use 1 vial in nebulizer EVERY 4 HOURS AS NEEDED FOR WHEEZING OR SHORTNESS OF BREATH 08/24/15  Yes Unk Pinto, MD  Magnesium 250 MG TABS Take 250 mg by mouth daily at 6 (six) AM.   Yes Historical Provider, MD  metFORMIN (GLUCOPHAGE-XR) 500 MG 24 hr tablet TAKE 2 TABLETS BY MOUTH TWICE DAILY AFTER MEALS Patient taking differently: TAKE 1 TABLET BY MOUTH TWICE DAILY AFTER MEALS 12/21/14  Yes Unk Pinto, MD  metolazone (ZAROXOLYN) 5 MG tablet Take daily with lasix or as directed for fluid over load Patient taking differently: Take 5 mg by mouth every other day. Take as directed for fluid over load 09/13/15  Yes Unk Pinto, MD  Omega-3 Fatty Acids (FISH OIL) 1000 MG CAPS Take 1,000 mg by mouth 2  (two) times daily.   Yes Historical Provider, MD  OVER THE COUNTER MEDICATION Take 1-2 tablets by mouth as needed (constipation). Natural Veg Laxative   Yes Historical Provider, MD  OXYGEN Inhale 3 L/min into the lungs daily as needed (On exertion and at home).    Yes Historical Provider, MD  pantoprazole (PROTONIX) 40 MG tablet Take 1 tablet (40 mg total) by mouth 2 (two) times daily. 09/05/15  Yes Vicie Mutters, PA-C  potassium chloride (K-DUR) 10 MEQ tablet Take 2 tablets (20 mEq total) by mouth daily. 09/05/15  Yes Unk Pinto, MD  spironolactone (ALDACTONE) 25 MG tablet TAKE 1 TABLET BY MOUTH THREE TIMES DAILY FOR FLUIDS AND SWELLING Patient taking differently: TAKE 1 TABLET BY MOUTH ONCE DAILY 09/05/15  Yes Vicie Mutters, PA-C  tamoxifen (NOLVADEX) 20 MG  tablet Take 1 tablet (20 mg total) by mouth daily. 04/26/15  Yes Courtney Forcucci, PA-C   BP 134/50 mmHg  Pulse 85  Temp(Src) 98.3 F (36.8 C) (Oral)  Resp 16  Ht 5' 2"  (1.575 m)  Wt 183 lb 3.2 oz (83.099 kg)  BMI 33.50 kg/m2  SpO2 98% Physical Exam  Constitutional: She is oriented to person, place, and time. She appears well-developed and well-nourished. No distress.  Somnolent, opens eyes to voice, answers questions appropriately, oriented to person and place.  HENT:  Head: Normocephalic and atraumatic.  Mouth/Throat: Oropharynx is clear and moist. No oropharyngeal exudate.  Dry mucous membranes  Eyes: Conjunctivae and EOM are normal. Pupils are equal, round, and reactive to light.  Neck: Normal range of motion. Neck supple.  C spine scar.  No midline tenderness  Cardiovascular: Normal rate, regular rhythm, normal heart sounds and intact distal pulses.   No murmur heard. Pulmonary/Chest: Effort normal and breath sounds normal. No respiratory distress. She exhibits no tenderness.  Abdominal: Soft. She exhibits distension. There is tenderness. There is no rebound and no guarding.  Mild diffuse tenderness, no guarding or  rebound  Musculoskeletal: Normal range of motion. She exhibits no edema or tenderness.  Neurological: She is alert and oriented to person, place, and time. No cranial nerve deficit. She exhibits normal muscle tone. Coordination normal.  No ataxia on finger to nose bilaterally. No pronator drift. 5/5 strength throughout. CN 2-12 intact.Equal grip strength. Sensation intact.  Mildly tremulous, positive asterixis Unable to stand, general weakness.   Skin: Skin is warm.  Psychiatric: She has a normal mood and affect. Her behavior is normal.  Nursing note and vitals reviewed.   ED Course  Procedures (including critical care time) Labs Review Labs Reviewed  COMPREHENSIVE METABOLIC PANEL - Abnormal; Notable for the following:    Potassium 3.3 (*)    Chloride 89 (*)    Glucose, Bld 198 (*)    BUN 27 (*)    Creatinine, Ser 1.52 (*)    Total Protein 5.7 (*)    Albumin 3.4 (*)    GFR calc non Af Amer 34 (*)    GFR calc Af Amer 40 (*)    Anion gap 18 (*)    All other components within normal limits  CBC - Abnormal; Notable for the following:    RBC 3.20 (*)    Hemoglobin 10.2 (*)    HCT 32.5 (*)    MCV 101.6 (*)    RDW 15.8 (*)    Platelets 97 (*)    All other components within normal limits  URINALYSIS, ROUTINE W REFLEX MICROSCOPIC (NOT AT [facility]) - Abnormal; Notable for the following:    APPearance HAZY (*)    All other components within normal limits  AMMONIA - Abnormal; Notable for the following:    Ammonia 83 (*)    All other components within normal limits  ACETAMINOPHEN LEVEL - Abnormal; Notable for the following:    Acetaminophen (Tylenol), Serum <10 (*)    All other components within normal limits  GLUCOSE, CAPILLARY - Abnormal; Notable for the following:    Glucose-Capillary 192 (*)    All other components within normal limits  I-STAT ARTERIAL BLOOD GAS, ED - Abnormal; Notable for the following:    pH, Arterial 7.501 (*)    pCO2 arterial 52.5 (*)    pO2, Arterial 106.0  (*)    Bicarbonate 41.0 (*)    Acid-Base Excess 16.0 (*)    All other  components within normal limits  I-STAT CG4 LACTIC ACID, ED - Abnormal; Notable for the following:    Lactic Acid, Venous 2.36 (*)    All other components within normal limits  I-STAT CG4 LACTIC ACID, ED - Abnormal; Notable for the following:    Lactic Acid, Venous 3.04 (*)    All other components within normal limits  LIPASE, BLOOD  BRAIN NATRIURETIC PEPTIDE  TROPONIN I  SALICYLATE LEVEL  ETHANOL  LACTIC ACID, PLASMA  VITAMIN B12  FOLATE RBC  RETICULOCYTES  TSH  COMPREHENSIVE METABOLIC PANEL  CBC  PROTIME-INR  OCCULT BLOOD X 1 CARD TO LAB, STOOL  MAGNESIUM    Imaging Review Dg Chest 2 View  09/21/2015  CLINICAL DATA:  Weakness.  Nausea and dehydration. EXAM: CHEST  2 VIEW COMPARISON:  08/07/2015 acute abdomen series. FINDINGS: Multifactorial degradation involves the lateral view. Arm position and overlying soft tissues. Cervical spine fixation. Right shoulder arthroplasty.Midline trachea. Normal heart size. Atherosclerosis in the transverse aorta. No pleural effusion or pneumothorax. Clear lungs. IMPRESSION: No acute cardiopulmonary disease. Aortic atherosclerosis. Electronically Signed   By: Abigail Miyamoto M.D.   On: 09/21/2015 16:10   Ct Head Wo Contrast  09/21/2015  CLINICAL DATA:  PT STS SHE FELL OUT OF BED THIS AM BUT DOESN'T KNOW IF HURT HERSELF OR NOT STS "JUST DOESN'T FEEL GOOD" PT IS A BIT CONFUSED THOUGH AS SHE COULD TELL ME HER BDAY BUT NOT HER NAME EXAM: CT HEAD WITHOUT CONTRAST TECHNIQUE: Contiguous axial images were obtained from the base of the skull through the vertex without intravenous contrast. COMPARISON:  CT 09/03/2014 FINDINGS: No acute intracranial hemorrhage. No focal mass lesion. No CT evidence of acute infarction. No midline shift or mass effect. No hydrocephalus. Basilar cisterns are patent. Paranasal sinuses and  mastoid air cells are clear. IMPRESSION: No acute intracranial findings.  Electronically Signed   By: Suzy Bouchard M.D.   On: 09/21/2015 16:34   US Abdomen Limited  09/21/2015  CLINICAL DATA:  70 year old female with possible ascites EXAM: LIMITED ABDOMEN ULTRASOUND FOR ASCITES TECHNIQUE: Limited ultrasound survey for ascites was performed in all four abdominal quadrants. COMPARISON:  None. FINDINGS: Sonographic evaluation of the 4 quadrants of the abdomen demonstrates no evidence of ascites. IMPRESSION: Negative for ascites. Electronically Signed   By: Jacqulynn Cadet M.D.   On: 09/21/2015 22:30   I have personally reviewed and evaluated these images and lab results as part of my medical decision-making.   EKG Interpretation   Date/Time:  Wednesday September 21 2015 15:50:50 EST Ventricular Rate:  76 PR Interval:  155 QRS Duration: 134 QT Interval:  471 QTC Calculation: 530 R Axis:   73 Text Interpretation:  Sinus rhythm Right bundle branch block Baseline  wander in lead(s) V5 wandering baseline Nonspecific T wave abnormality  Confirmed by [facility] [phone]) on 09/21/2015 3:55:53 PM      MDM   Final diagnoses:  Hepatic encephalopathy ([place])   Patient with increase in confusion, falls and generalized weakness over the past several days history of nonalcoholic cirrhosis. Possible asterixis on exam generalized weakness.  Labs show stable hemoglobin and thrombocytopenia.  CT head negative, UA and CXR negative. Mild lactate elevation, likely from dehydration.  Will gently hydrate as she may be overdiuresed. No CO2 retention on ABG. Ammonia 83, likely source of AMS. Lactulose given. No fever or leukocytosis, no ascites seen, doubt SBP.  Admission dw Dr. Loleta Books.  Ezequiel Essex, MD 09/22/15 9147

## 2015-09-21 NOTE — H&P (Signed)
History and Physical  Patient Name: [name]     VCB:[id number]    DOB: [birth year]    DOA: 09/21/2015 Referring physician: Ezequiel Essex, MD PCP: Alesia Richards, MD      Chief Complaint: Confusion, weakness  HPI: [name] is a 70 y.o. female with a past medical history significant for NASH cirrhosis with varices, HTN, fibromyalgia, CKD, and COPD on chronic home O2 who presents with confusion and weakness.   The patient was admitted 6 weeks ago with hypokalemia from overdiuresis (diuresis appears to be targeted at venous insufficiency as well as ascites/cirrhosis?).  In a recent cardiology evaluation, it was thought that her volume overload symptoms were less related to congestive heart failure, and more amenable to nonpharmacologic approaches to reducing leg swelling.  In the last 3 weeks, the patient's weight has increased 180 pounds, her PCP recently added metolazone and increased her furosemide one week ago, and now in the last 4 days, she has been increasingly weak, fell out of bed once, nauseated, with decreased appetite, intermittently confused, and having "jerks" while sipping her coffee for example. Today her husband thought she didn't know where she was and might be hallucinating, so he brought her to the ER.  In the ED, she was afebrile and hemodynamically stable, with slightly low blood pressure. K3.3, HCO3 31, Cr 1.5 (baseline 1.0-1.6), albumin 3.4, AST/ALT normal, the BB C6, hemoglobin 10.2, platelets 97, BNP normal, ammonia 83, alcohol negative, lactate 2.3.  CT head without contrast and CXR were unremarkable.  Slow fluids were administered and lactulose, and TRH were asked to evaluate for admission.     Review of Systems:  Pt complains of jerking, weakness, confusion, nausea, decreased appetite, feeling slow, feeling weak, constipation, chronic back pain. Pt denies any fever, vomiting, focal weakness, slurred speech, seizure, passing out, neck pain,  increased swelling, weight gain, cough, dysuria.  All other systems negative except as just noted or noted in the history of present illness.  Allergies  Allergen Reactions  . Atorvastatin Other (See Comments)    Doesn't remember  . Diphenhydramine Hcl (Sleep) Hives  . Gemfibrozil Other (See Comments)    Doesn't remember  . Hydrocodone-Acetaminophen Other (See Comments)    Doesn't remember  . Loratadine Hives  . Lorazepam Hives  . Simvastatin Other (See Comments)  . Sulfamethoxazole Hives  . Sulfonamide Derivatives Hives    Prior to Admission medications   Medication Sig Start Date End Date Taking? Authorizing Provider  acetaminophen-codeine (TYLENOL #3) 300-30 MG tablet TAKE ONE-HALF TO ONE TABLET BY MOUTH FOUR TIMES DAILY AS NEEDED 05/26/15  Yes Unk Pinto, MD  allopurinol (ZYLOPRIM) 300 MG tablet 1/2-1 tablet daily for gout Patient taking differently: Take 300 mg by mouth daily.  03/03/15 03/02/18 Yes Unk Pinto, MD  ALPRAZolam Duanne Moron) 0.5 MG tablet 1/2-1 pil up to 3 times a day for anxiety Patient taking differently: Take 0.25-5 mg by mouth 3 (three) times daily as needed for anxiety.  07/28/15  Yes Vicie Mutters, PA-C  amitriptyline (ELAVIL) 10 MG tablet Take 1 tablet (10 mg total) by mouth 3 (three) times daily as needed. Patient taking differently: Take 10 mg by mouth 2 (two) times daily.  06/23/15  Yes Unk Pinto, MD  aspirin 81 MG tablet Take 81 mg by mouth daily.   Yes Historical Provider, MD  bisoprolol (ZEBETA) 10 MG tablet Take 1 tablet (10 mg total) by mouth daily. 09/13/15  Yes Unk Pinto, MD  Cholecalciferol 5000 units capsule Take 10,000  Units by mouth daily.   Yes Historical Provider, MD  citalopram (CELEXA) 40 MG tablet TAKE 1 TABLET BY MOUTH TWICE A  DAY FOR MOOD 05/23/15  Yes Unk Pinto, MD  cyclobenzaprine (FLEXERIL) 10 MG tablet Take 1 tablet (10 mg total) by mouth 3 (three) times daily as needed for muscle spasms. 11/17/14  Yes Courtney  Forcucci, PA-C  ferrous sulfate 325 (65 FE) MG tablet TAKE 1 TABLET TWICE DAILY Patient taking differently: TAKE 1 TABLET ONCE DAILY 01/19/13  Yes Inda Castle, MD  furosemide (LASIX) 40 MG tablet Take 1-2 tablets (40-80 mg total) by mouth 2 (two) times daily. 80 mg in am and 40 mg in pm Patient taking differently: Take 80 mg by mouth 2 (two) times daily.  08/09/15  Yes Costin Karlyne Greenspan, MD  gabapentin (NEURONTIN) 300 MG capsule take 1 capsule by mouth in the morning, 1 capsule in the afternoon and 1-3 capsules at night Patient taking differently: Take 300 mg by mouth 3 (three) times daily.  07/27/15  Yes Vicie Mutters, PA-C  guaiFENesin (ROBITUSSIN) 100 MG/5ML liquid Take 5-10 mLs (100-200 mg total) by mouth every 4 (four) hours as needed for cough. 07/26/15  Yes Shary Decamp, PA-C  ipratropium (ATROVENT) 0.02 % nebulizer solution use 1 vial in nebulizer EVERY 4 HOURS AS NEEDED FOR WHEEZING OR SHORTNESS OF BREATH 08/24/15  Yes Unk Pinto, MD  Magnesium 250 MG TABS Take 250 mg by mouth daily at 6 (six) AM.   Yes Historical Provider, MD  metFORMIN (GLUCOPHAGE-XR) 500 MG 24 hr tablet TAKE 2 TABLETS BY MOUTH TWICE DAILY AFTER MEALS Patient taking differently: TAKE 1 TABLET BY MOUTH TWICE DAILY AFTER MEALS 12/21/14  Yes Unk Pinto, MD  metolazone (ZAROXOLYN) 5 MG tablet Take daily with lasix or as directed for fluid over load Patient taking differently: Take 5 mg by mouth every other day. Take as directed for fluid over load 09/13/15  Yes Unk Pinto, MD  Omega-3 Fatty Acids (FISH OIL) 1000 MG CAPS Take 1,000 mg by mouth 2 (two) times daily.   Yes Historical Provider, MD  ondansetron (ZOFRAN) 4 MG tablet Take 1 tablet (4 mg total) by mouth daily as needed for nausea or vomiting. 08/05/15 08/04/16 Yes Vicie Mutters, PA-C  OVER THE COUNTER MEDICATION Take 1-2 tablets by mouth as needed (constipation). Natural Veg Laxative   Yes Historical Provider, MD  OXYGEN Inhale 3 L/min into the lungs daily  as needed (On exertion and at home).    Yes Historical Provider, MD  pantoprazole (PROTONIX) 40 MG tablet Take 1 tablet (40 mg total) by mouth 2 (two) times daily. 09/05/15  Yes Vicie Mutters, PA-C  potassium chloride (K-DUR) 10 MEQ tablet Take 2 tablets (20 mEq total) by mouth daily. 09/05/15  Yes Unk Pinto, MD  spironolactone (ALDACTONE) 25 MG tablet TAKE 1 TABLET BY MOUTH THREE TIMES DAILY FOR FLUIDS AND SWELLING Patient taking differently: TAKE 1 TABLET BY MOUTH ONCE DAILY 09/05/15  Yes Vicie Mutters, PA-C  tamoxifen (NOLVADEX) 20 MG tablet Take 1 tablet (20 mg total) by mouth daily. 04/26/15  Yes Courtney Forcucci, PA-C  nystatin (MYCOSTATIN) 100000 UNIT/ML suspension Take 5 mLs (500,000 Units total) by mouth 4 (four) times daily. Swish and swallow 08/02/15   Vicie Mutters, PA-C    Past Medical History  Diagnosis Date  . Hx of shoulder replacement   . Hypertension   . Hyperlipidemia   . COPD (chronic obstructive pulmonary disease) ([place])   . Vitamin D deficiency   .  IBS (irritable bowel syndrome)   . Fibromyalgia   . Hepatic cirrhosis [facility])     Past Surgical History  Procedure Laterality Date  . Hip arthroplasty Bilateral   . Shoulder surgery Right   . Back surgery    . Cholecystectomy    . Neck surgery    . Esophagogastroduodenoscopy  07/16/2012    Procedure: ESOPHAGOGASTRODUODENOSCOPY (EGD);  Surgeon: Inda Castle, MD;  Location: [facility];  Service: Endoscopy;  Laterality: N/A;  . Gastric varices banding  07/16/2012    Procedure: GASTRIC VARICES BANDING;  Surgeon: Inda Castle, MD;  Location: [facility] ENDOSCOPY;  Service: Endoscopy;  Laterality: N/A;    Family history: family history includes Diabetes in her brother; Heart disease in her mother and sister. There is no history of Colon cancer.  Social History: Patient lives with her husband. She walks with a walker or cane. She quit smoking about one year ago.       Physical Exam: BP 144/107 mmHg  Pulse 83   Temp(Src) 99 F (37.2 C) (Oral)  Resp 15  SpO2 99% General appearance: Obese adult female, awake but sleepy and in no acute distress.  Responses slowed.   Eyes: Anicteric, conjunctiva pink, lids and lashes normal.     ENT: No nasal deformity, discharge, or epistaxis.  OP moist without lesions.  Dentures.   Lymph: No cervical or supraclavicular lymphadenopathy, limited by habitus. Skin: Warm and dry.  Palmar erythema, no caput.  Chronic venous stasis changes to bilateral legs, appears WNL. Cardiac: RRR, nl S1-S2, no murmurs appreciated.  Capillary refill is brisk.  JVP not visible.  1+ LE edema.  Radial and DP pulses 2+ and symmetric. Respiratory: Normal respiratory rate and rhythm.  Poor inspiratory effort/ability given habitus, scarce wheeze, no rales. Abdomen: Abdomen large, round.  Not tense, but distended, possible fluid.  Mild TTP on R, no guarding. MSK: No deformities or effusions. Neuro: Mental slowing.  HOH.  Oriented to year, month, day of week, place.  Attention diminished.  Speech is fluent.  Normal coordination, strength equal, right arm movement limited by old surgery, FTN normal, asterixis present, globally weak. Psych: Behavior slowed and dazed.  Affect constrained.  No evidence of aural or visual hallucinations or delusions.       Labs on Admission:  The metabolic panel shows mild hypokalemia, stage III CKD, hyperglycemia, low albumin, normal transaminases and bilirubin. Anion gap is elevated. Lipase normal. Troponin normal. Urinalysis without pyuria or hematuria or bacteria. BMP normal. Ammonia 83. Alcohol negative. Salicylates and acetaminophen undetectable. Lactic acid 2.3 The complete blood count shows no leukocytosis. Slightly worse macrocytic anemia than usual (baseline 12-14), thrombocytopenia stable.   Radiological Exams on Admission: Personally reviewed: Dg Chest 2 View 09/21/2015   Stable lung scarring, no focal opacity compared with previous last fall.   Old right shoulder prosthesis.   Ct Head Wo Contrast 09/21/2015   NAICP   EKG: Independently reviewed. Right bundle branch block, rate 76, QTc 5:30, sinus rhythm. No ST changes.   Echocardiogram January 2017: Ejection fraction 55%, LVH, no significant for disease.  Last upper endoscopy 2015: Grade 2-3 varices    Assessment/Plan 1. Altered mental status, confusion, suspect Hepatic Encephalopathy:  This is new.  There is elevated ammonia, nausea, inattention, weakness, and asterixis.  Other diagnostic considerations include: EtOH is negative, no new medicines that might cause AMS, dehydration may be contributing given recent increase in diuretics.  Intracranial bleed/mass ruled out with CT head normal.  Electrolytes otherwise normal  and no overt infection.  Will evaluate for ascites and if present rule out SBP.   -Lactulose 30 g given, continue 20 g hourly until soft BM and then lactulose 20 g TID titrate to 2-3 BM daily -Consult to GI, appreciate cares   2. AKI/dehydration:  This is new.  The diuretics were recently increased.   -Will hold furosemide and spironolactone tonight -Restart spironolactone tomorrow -Restart furosemide at 40 mg daily tomorrow (this had been increased one week ago due to weight gain to ~180lbs from baseline 150lbs) -Trend BMP  3. Anemia, macrocytic:  -Check Vitamin B12, folate, TSH -Continue iron  4. Elevated lactate:  Likely poor clearance, infection doubted at present. -Hold diuretics tonight and repeat lactate tomorrow  5. Hypokalemia:  -Check magnesium -Replace orally  6. Cirrhosis from NASH:  -Continue home spironolactone and go back to old dose of furosemide, starting tomorrow -Consult to GI appreciate cares -Lactulose started for first time, as above  7. Chronic diastolic CHF:  No clinical CHF at present, this diagnosis is questioned.  Recent EF 55% without valvular disease.  BNP normal.  8. COPD: Does not appear clinically active at  present -Albuterol available PRN  9. HTN: -Continue bisoprolol, furosemide, and spironolactone and aspirin  10. Elevated QTc: QTc 530, chronic. -Avoid QTc prolonging agents.  11. NIDDM: Hyperglycemic at admission -Hold metformin -Sliding scale corrections  12. Fibromyalgia and chronic pain: -Continue citalopram (QT prolonging), amitriptyline, gabapentin, alprazolam, and Tylenol 3  13. Gout: -Continue allopurinol  14. Thrombocytopenia: Chronic.  From cirrhosis.       DVT PPx: SCDs Diet: Diabetic Consultants: GI Code Status: FULL Family Communication: Husband at bedside.  CODE STATUS confirmed.  HE explained.  Overnight plan discussed.  Medical decision making: What exists of the patient's previous chart was reviewed in depth and the case was discussed with Dr. Wyvonnia Dusky. Patient seen 7:48 PM on 09/21/2015.  Disposition Plan:  I recommend admission to med surg inpatient status.  Clinical condition: stable.  Anticipate treatment of HE over 2-3 days, evaluation by subspecialty service, abdomen US for ascites and then discharge when mentally clearer and follow up of HE in place.      Edwin Dada [facility] Pager [phone]

## 2015-09-21 NOTE — ED Notes (Signed)
Attempted report 

## 2015-09-21 NOTE — ED Notes (Signed)
Pt here for overall weakness and falling due to legs giving out. sts some nausea and dehydration. sts shaking.

## 2015-09-21 NOTE — ED Notes (Signed)
Pt placed into gown and on to monitor upon arrival to room. Pt monitored by blood pressure and pulse ox. DR. Wyvonnia Dusky at bedside.

## 2015-09-22 ENCOUNTER — Ambulatory Visit: Payer: Self-pay

## 2015-09-22 DIAGNOSIS — J9611 Chronic respiratory failure with hypoxia: Secondary | ICD-10-CM | POA: Diagnosis not present

## 2015-09-22 DIAGNOSIS — J449 Chronic obstructive pulmonary disease, unspecified: Secondary | ICD-10-CM | POA: Diagnosis not present

## 2015-09-22 DIAGNOSIS — E1122 Type 2 diabetes mellitus with diabetic chronic kidney disease: Secondary | ICD-10-CM

## 2015-09-22 DIAGNOSIS — J9612 Chronic respiratory failure with hypercapnia: Secondary | ICD-10-CM

## 2015-09-22 DIAGNOSIS — K7290 Hepatic failure, unspecified without coma: Principal | ICD-10-CM

## 2015-09-22 DIAGNOSIS — N183 Chronic kidney disease, stage 3 (moderate): Secondary | ICD-10-CM

## 2015-09-22 DIAGNOSIS — I10 Essential (primary) hypertension: Secondary | ICD-10-CM

## 2015-09-22 LAB — GLUCOSE, CAPILLARY
GLUCOSE-CAPILLARY: 150 mg/dL — AB (ref 65–99)
Glucose-Capillary: 131 mg/dL — ABNORMAL HIGH (ref 65–99)
Glucose-Capillary: 164 mg/dL — ABNORMAL HIGH (ref 65–99)
Glucose-Capillary: 257 mg/dL — ABNORMAL HIGH (ref 65–99)

## 2015-09-22 LAB — COMPREHENSIVE METABOLIC PANEL
ALK PHOS: 86 U/L (ref 38–126)
ALT: 22 U/L (ref 14–54)
ANION GAP: 15 (ref 5–15)
AST: 40 U/L (ref 15–41)
Albumin: 3.6 g/dL (ref 3.5–5.0)
BILIRUBIN TOTAL: 1 mg/dL (ref 0.3–1.2)
BUN: 24 mg/dL — ABNORMAL HIGH (ref 6–20)
CALCIUM: 10 mg/dL (ref 8.9–10.3)
CO2: 31 mmol/L (ref 22–32)
CREATININE: 1.34 mg/dL — AB (ref 0.44–1.00)
Chloride: 96 mmol/L — ABNORMAL LOW (ref 101–111)
GFR, EST AFRICAN AMERICAN: 46 mL/min — AB (ref >60)
GFR, EST NON AFRICAN AMERICAN: 40 mL/min — AB (ref >60)
Glucose, Bld: 171 mg/dL — ABNORMAL HIGH (ref 65–99)
Potassium: 3.5 mmol/L (ref 3.5–5.1)
SODIUM: 142 mmol/L (ref 135–145)
TOTAL PROTEIN: 6.2 g/dL — AB (ref 6.5–8.1)

## 2015-09-22 LAB — CBC
HCT: 36 % (ref 36.0–46.0)
HEMOGLOBIN: 11 g/dL — AB (ref 12.0–15.0)
MCH: 31.5 pg (ref 26.0–34.0)
MCHC: 30.6 g/dL (ref 30.0–36.0)
MCV: 103.2 fL — ABNORMAL HIGH (ref 78.0–100.0)
PLATELETS: 106 10*3/uL — AB (ref 150–400)
RBC: 3.49 MIL/uL — AB (ref 3.87–5.11)
RDW: 16.2 % — ABNORMAL HIGH (ref 11.5–15.5)
WBC: 7 10*3/uL (ref 4.0–10.5)

## 2015-09-22 LAB — VITAMIN B12: VITAMIN B 12: 846 pg/mL (ref 180–914)

## 2015-09-22 LAB — PROTIME-INR
INR: 1.11 (ref 0.00–1.49)
Prothrombin Time: 14.5 seconds (ref 11.6–15.2)

## 2015-09-22 LAB — RETICULOCYTES
RBC.: 3.49 MIL/uL — ABNORMAL LOW (ref 3.87–5.11)
RETIC CT PCT: 3.6 % — AB (ref 0.4–3.1)
Retic Count, Absolute: 125.6 10*3/uL (ref 19.0–186.0)

## 2015-09-22 LAB — LACTIC ACID, PLASMA
LACTIC ACID, VENOUS: 3.4 mmol/L — AB (ref 0.5–2.0)
Lactic Acid, Venous: 2.5 mmol/L (ref 0.5–2.0)

## 2015-09-22 LAB — OCCULT BLOOD X 1 CARD TO LAB, STOOL: FECAL OCCULT BLD: NEGATIVE

## 2015-09-22 LAB — TSH: TSH: 1.152 u[IU]/mL (ref 0.350–4.500)

## 2015-09-22 LAB — MAGNESIUM: MAGNESIUM: 2.4 mg/dL (ref 1.7–2.4)

## 2015-09-22 MED ORDER — LACTULOSE 10 GM/15ML PO SOLN
20.0000 g | Freq: Two times a day (BID) | ORAL | Status: DC
  Administered 2015-09-22 – 2015-09-23 (×3): 20 g via ORAL
  Filled 2015-09-22 (×3): qty 30

## 2015-09-22 MED ORDER — HYDROXYZINE HCL 10 MG PO TABS
10.0000 mg | ORAL_TABLET | Freq: Three times a day (TID) | ORAL | Status: DC | PRN
  Administered 2015-09-22 – 2015-09-23 (×2): 10 mg via ORAL
  Filled 2015-09-22 (×3): qty 1

## 2015-09-22 MED ORDER — CAMPHOR-MENTHOL 0.5-0.5 % EX LOTN
TOPICAL_LOTION | CUTANEOUS | Status: DC | PRN
  Administered 2015-09-22: 20:00:00 via TOPICAL
  Filled 2015-09-22: qty 222

## 2015-09-22 MED ORDER — SODIUM CHLORIDE 0.9 % IV BOLUS (SEPSIS)
1000.0000 mL | Freq: Once | INTRAVENOUS | Status: AC
  Administered 2015-09-22: 1000 mL via INTRAVENOUS

## 2015-09-22 MED ORDER — POTASSIUM CHLORIDE CRYS ER 20 MEQ PO TBCR
20.0000 meq | EXTENDED_RELEASE_TABLET | Freq: Once | ORAL | Status: AC
  Administered 2015-09-22: 20 meq via ORAL
  Filled 2015-09-22: qty 1

## 2015-09-22 NOTE — Progress Notes (Signed)
PROGRESS NOTE  [name] NLZ:[id number] DOB: 01-Mar-1946 DOA: 09/21/2015 PCP: Alesia Richards, MD Brief History 70 year old female with a history of NASH cirrhosis with varices, hypertension, CKD, chronic respiratory failure/COPD on chronic home oxygen presented with 4 day history of anorexia, increasing generalized weakness, nausea, and confusion. According to the patient's husband, the patient had some myoclonic jerks all chicken coffee as well as having hallucinations. The patient recently followed up with Dr. Percival Spanish on 09/06/15 who felt that the patient's peripheral edema while may be partly attributable to her diastolic dysfunction, other etiologies need to be considered. He also felt that overdiuresis continues to be a concern and other nonpharmacologic therapies for her peripheral edema may need to be pursued.  At the time of her office visit, the patient's weight was 179 pounds. More recently, her primary care provider started the patient on metolazone 5 mg daily for peripheral edema.  Assessment/Plan: Acute encephalopathy/hepatic encephalopathy -Ammonia 83 at the time of admission -Continue lactulose -CT brain negative -Urinalysis is negative for pyuria -improving  CKD stage II-III -Baseline creatinine 1.0-1.4 -Presenting creatinine 1.52  Lactic acidosis -Likely a degree of volume depletion as well as metformin usage in the setting of worsening renal function -Hold diuretics and monitor clinically  Chronic diastolic CHF/LE Edema -90/24/0973 echo EF 55-60%, grade 1 DD -Daily weights -Continue bisoprolol -clinically compensated at this time -no peripheral edema on exam -hold diuretics today  NASH cirrhosis -Continue lactulose -We'll ultimately need to restart diuretics, possibly a lower dose -09/21/2015-limited abdominal ultrasound--NO ascites  Chronic respiratory failure/COPD -Clinically compensated -Continue albuterol when necessary -home oxygen  at 3L  Diabetes mellitus type 2 -Hold metformin--may need to discontinue altogether given the patient's fluctuations in renal function -NovoLog sliding scale -07/22/2015 hemoglobin A1c 5.9  Hypokalemia -Repleted -Check magnesium--2.4  Prolonged QTC -Avoid QTC prolonging agents when possible  Macrocytic anemia -B12--846 -folate--pending -Baseline hemoglobin 10-11  Thrombocytopenia -Chronic secondary to her liver disease -Monitor for objective signs of bleeding   Family Communication:   Pt at beside Disposition Plan:   Home 1-2 days       Procedures/Studies: Dg Chest 2 View  09/21/2015  CLINICAL DATA:  Weakness.  Nausea and dehydration. EXAM: CHEST  2 VIEW COMPARISON:  08/07/2015 acute abdomen series. FINDINGS: Multifactorial degradation involves the lateral view. Arm position and overlying soft tissues. Cervical spine fixation. Right shoulder arthroplasty.Midline trachea. Normal heart size. Atherosclerosis in the transverse aorta. No pleural effusion or pneumothorax. Clear lungs. IMPRESSION: No acute cardiopulmonary disease. Aortic atherosclerosis. Electronically Signed   By: Abigail Miyamoto M.D.   On: 09/21/2015 16:10   Ct Head Wo Contrast  09/21/2015  CLINICAL DATA:  PT STS SHE FELL OUT OF BED THIS AM BUT DOESN'T KNOW IF HURT HERSELF OR NOT STS "JUST DOESN'T FEEL GOOD" PT IS A BIT CONFUSED THOUGH AS SHE COULD TELL ME HER BDAY BUT NOT HER NAME EXAM: CT HEAD WITHOUT CONTRAST TECHNIQUE: Contiguous axial images were obtained from the base of the skull through the vertex without intravenous contrast. COMPARISON:  CT 09/03/2014 FINDINGS: No acute intracranial hemorrhage. No focal mass lesion. No CT evidence of acute infarction. No midline shift or mass effect. No hydrocephalus. Basilar cisterns are patent. Paranasal sinuses and  mastoid air cells are clear. IMPRESSION: No acute intracranial findings. Electronically Signed   By: Suzy Bouchard M.D.   On: 09/21/2015 16:34   US Abdomen  Limited  09/21/2015  CLINICAL DATA:  70 year old female with possible  ascites EXAM: LIMITED ABDOMEN ULTRASOUND FOR ASCITES TECHNIQUE: Limited ultrasound survey for ascites was performed in all four abdominal quadrants. COMPARISON:  None. FINDINGS: Sonographic evaluation of the 4 quadrants of the abdomen demonstrates no evidence of ascites. IMPRESSION: Negative for ascites. Electronically Signed   By: Jacqulynn Cadet M.D.   On: 09/21/2015 22:30         Subjective: Patient denies fevers, chills, headache, chest pain, dyspnea, nausea, vomiting, diarrhea, abdominal pain, dysuria, hematuria. She has some mild dyspnea on exertion.   Objective: Filed Vitals:   09/21/15 1945 09/21/15 2000 09/21/15 2115 09/22/15 0644  BP: 115/65 124/56 134/50 109/84  Pulse: 84 83 85 84  Temp:   98.3 F (36.8 C) 98.1 F (36.7 C)  TempSrc:   Oral   Resp: 14 15 16 16   Height:   5' 2"  (1.575 m)   Weight:   83.099 kg (183 lb 3.2 oz)   SpO2: 98% 97% 98% 97%    Intake/Output Summary (Last 24 hours) at 09/22/15 6045 Last data filed at 09/22/15 0500  Gross per 24 hour  Intake 762.75 ml  Output    600 ml  Net 162.75 ml   Weight change:  Exam:   General:  Pt is alert, follows commands appropriately, not in acute distress  HEENT: No icterus, No thrush, No neck mass, [place]/AT  Cardiovascular: RRR, S1/S2, no rubs, no gallops  Respiratory: Diminished breath sounds, but clear to auscultation  Abdomen: Soft/+BS, non tender, non distended, no guarding  Extremities: No edema, No lymphangitis, No petechiae, No rashes, no synovitis; no clubbing or cyanosis  Data Reviewed: Basic Metabolic Panel:  Recent Labs Lab 09/21/15 1234 09/22/15 0550  NA 138 142  K 3.3* 3.5  CL 89* 96*  CO2 31 31  GLUCOSE 198* 171*  BUN 27* 24*  CREATININE 1.52* 1.34*  CALCIUM 9.6 10.0  MG  --  2.4   Liver Function Tests:  Recent Labs Lab 09/21/15 1234 09/22/15 0550  AST 34 40  ALT 19 22  ALKPHOS 87 86  BILITOT 1.0  1.0  PROT 5.7* 6.2*  ALBUMIN 3.4* 3.6    Recent Labs Lab 09/21/15 1234  LIPASE 37    Recent Labs Lab 09/21/15 1650  AMMONIA 83*   CBC:  Recent Labs Lab 09/21/15 1234 09/22/15 0550  WBC 6.0 7.0  HGB 10.2* 11.0*  HCT 32.5* 36.0  MCV 101.6* 103.2*  PLT 97* 106*   Cardiac Enzymes:  Recent Labs Lab 09/21/15 1650  TROPONINI <0.03   BNP: Invalid input(s): POCBNP CBG:  Recent Labs Lab 09/21/15 2325  GLUCAP 192*    No results found for this or any previous visit (from the past 240 hour(s)).   Scheduled Meds: . allopurinol  300 mg Oral Daily  . amitriptyline  10 mg Oral BID  . aspirin  81 mg Oral Daily  . bisoprolol  10 mg Oral Daily  . citalopram  40 mg Oral Daily  . ferrous sulfate  325 mg Oral BID  . furosemide  40 mg Oral Daily  . gabapentin  300 mg Oral TID  . insulin aspart  0-5 Units Subcutaneous QHS  . insulin aspart  0-9 Units Subcutaneous TID WC  . lactulose  20 g Oral BID  . pantoprazole  40 mg Oral BID AC  . potassium chloride  20 mEq Oral BID  . spironolactone  25 mg Oral Daily  . tamoxifen  20 mg Oral Daily   Continuous Infusions:    Taylor Levick,  DO  [facility] Pager [phone]  If 7PM-7AM, please contact night-coverage [email or web address] Password [email or web address] 09/22/2015, 8:08 AM   LOS: 1 day

## 2015-09-22 NOTE — Progress Notes (Addendum)
Patient just had a large soft, brown,green BM after 4 doses of lactulose (every hour dose).  Patient relieved but abdomen still distended.  Denies any pain.  Stool sample for blood occult card obtained and sent to lab per order. Patient back to bed resting.

## 2015-09-22 NOTE — Care Management Note (Signed)
Case Management Note  Patient Details  Name: FRANCELIA MCLAREN MRN: [id number] Date of Birth: 1945-09-01  Subjective/Objective:                    Action/Plan: Initial UR completed   Expected Discharge Date:                  Expected Discharge Plan:  Home/Self Care  In-House Referral:     Discharge planning Services     Post Acute Care Choice:    Choice offered to:     DME Arranged:    DME Agency:     HH Arranged:    [place] Agency:     Status of Service:  In process, will continue to follow  Medicare Important Message Given:    Date Medicare IM Given:    Medicare IM give by:    Date Additional Medicare IM Given:    Additional Medicare Important Message give by:     If discussed at [place] of Stay Meetings, dates discussed:    Additional Comments:  Marilu Favre, RN 09/22/2015, 8:00 AM

## 2015-09-22 NOTE — Progress Notes (Signed)
CRITICAL VALUE ALERT  Critical value received:  Lactic Acid 3.4  Date of notification:  09/22/15  Time of notification:  0630  Critical value read back: yes  Nurse who received alert:  Cailin Gebel, RN  MD notified (1st page):  C. Danford  Time of first page:  [phone]  MD notified (2nd page):  Time of second page:  Responding MD:  C. Danford  Time MD responded:  [phone]

## 2015-09-22 NOTE — Progress Notes (Signed)
Received patient from ED. Patient AOx3, VS stable, O2Sat at 98% on 2L via [place], denies any pain and husband at bedside.  Patient oriented to room, bed controls and call light.  Patient went for US Abdomen a few minutes later while husband went home.  Patient ate graham crackers with peanut butter and drank coke.  As of this writing, patient had no BM yet while 2 Lactulose already given.  Patient resting on bed comfortably with both eyes closed.  Patient understands that she will be taking the hourly Lactulose dose until having a BM and she will just be awaken for the hourly dose.

## 2015-09-23 DIAGNOSIS — J449 Chronic obstructive pulmonary disease, unspecified: Secondary | ICD-10-CM | POA: Diagnosis not present

## 2015-09-23 DIAGNOSIS — J9612 Chronic respiratory failure with hypercapnia: Secondary | ICD-10-CM | POA: Diagnosis not present

## 2015-09-23 DIAGNOSIS — J9611 Chronic respiratory failure with hypoxia: Secondary | ICD-10-CM | POA: Diagnosis not present

## 2015-09-23 DIAGNOSIS — E1122 Type 2 diabetes mellitus with diabetic chronic kidney disease: Secondary | ICD-10-CM | POA: Diagnosis not present

## 2015-09-23 DIAGNOSIS — K7290 Hepatic failure, unspecified without coma: Secondary | ICD-10-CM | POA: Diagnosis not present

## 2015-09-23 LAB — COMPREHENSIVE METABOLIC PANEL
ALBUMIN: 3.1 g/dL — AB (ref 3.5–5.0)
ALK PHOS: 80 U/L (ref 38–126)
ALT: 19 U/L (ref 14–54)
AST: 39 U/L (ref 15–41)
Anion gap: 10 (ref 5–15)
BILIRUBIN TOTAL: 1 mg/dL (ref 0.3–1.2)
BUN: 19 mg/dL (ref 6–20)
CALCIUM: 9.1 mg/dL (ref 8.9–10.3)
CO2: 28 mmol/L (ref 22–32)
CREATININE: 1.24 mg/dL — AB (ref 0.44–1.00)
Chloride: 101 mmol/L (ref 101–111)
GFR calc Af Amer: 51 mL/min — ABNORMAL LOW (ref >60)
GFR calc non Af Amer: 44 mL/min — ABNORMAL LOW (ref >60)
GLUCOSE: 165 mg/dL — AB (ref 65–99)
Potassium: 3.9 mmol/L (ref 3.5–5.1)
SODIUM: 139 mmol/L (ref 135–145)
Total Protein: 5.3 g/dL — ABNORMAL LOW (ref 6.5–8.1)

## 2015-09-23 LAB — GLUCOSE, CAPILLARY
GLUCOSE-CAPILLARY: 142 mg/dL — AB (ref 65–99)
GLUCOSE-CAPILLARY: 187 mg/dL — AB (ref 65–99)
GLUCOSE-CAPILLARY: 207 mg/dL — AB (ref 65–99)
Glucose-Capillary: 230 mg/dL — ABNORMAL HIGH (ref 65–99)

## 2015-09-23 LAB — AMMONIA: Ammonia: 59 umol/L — ABNORMAL HIGH (ref 9–35)

## 2015-09-23 LAB — FOLATE RBC
Folate, Hemolysate: >620 ng/mL
HEMATOCRIT: 34 % (ref 34.0–46.6)

## 2015-09-23 MED ORDER — LACTULOSE 10 GM/15ML PO SOLN: 20.0000 g | Freq: Three times a day (TID) | ORAL | Status: DC

## 2015-09-23 MED ORDER — CITALOPRAM HYDROBROMIDE 20 MG PO TABS
20.0000 mg | ORAL_TABLET | Freq: Every day | ORAL | Status: DC
  Administered 2015-09-24: 20 mg via ORAL
  Filled 2015-09-23: qty 1

## 2015-09-23 MED ORDER — LACTULOSE 10 GM/15ML PO SOLN
20.0000 g | Freq: Three times a day (TID) | ORAL | Status: DC
  Administered 2015-09-24: 20 g via ORAL
  Filled 2015-09-23: qty 30

## 2015-09-23 MED ORDER — LACTULOSE 10 GM/15ML PO SOLN
30.0000 g | Freq: Once | ORAL | Status: AC
  Administered 2015-09-23: 30 g via ORAL
  Filled 2015-09-23: qty 45

## 2015-09-23 MED ORDER — ONDANSETRON HCL 4 MG/2ML IJ SOLN
4.0000 mg | Freq: Once | INTRAMUSCULAR | Status: AC
  Administered 2015-09-23: 4 mg via INTRAVENOUS
  Filled 2015-09-23: qty 2

## 2015-09-23 NOTE — Care Management Important Message (Signed)
Important Message  Patient Details  Name: [name] MRN: [id number] Date of Birth: [birth year]   Medicare Important Message Given:  Yes    Cherise Fedder P Zubair Lofton 09/23/2015, 11:54 AM

## 2015-09-23 NOTE — Progress Notes (Signed)
PROGRESS NOTE  [name] QZR:[id number] DOB: 15-Oct-1945 DOA: 09/21/2015 PCP: Alesia Richards, MD  Brief History 70 year old female with a history of NASH cirrhosis with varices, hypertension, CKD, chronic respiratory failure/COPD on chronic home oxygen presented with 4 day history of anorexia, increasing generalized weakness, nausea, and confusion. According to the patient's husband, the patient had some myoclonic jerks all chicken coffee as well as having hallucinations. The patient recently followed up with Dr. Percival Spanish on 09/06/15 who felt that the patient's peripheral edema while may be partly attributable to her diastolic dysfunction, other etiologies need to be considered. He also felt that overdiuresis continues to be a concern and other nonpharmacologic therapies for her peripheral edema may need to be pursued. At the time of her office visit, the patient's weight was 179 pounds. More recently, her primary care provider started the patient on metolazone 5 mg daily for peripheral edema.  Assessment/Plan: Acute encephalopathy/hepatic encephalopathy -Ammonia 83 at the time of admission--> 59 -Continue lactulose -CT brain negative -Urinalysis is negative for pyuria -husband states she is back to baseline -Serum B12--846 -TSH 1.152  -increase lactulose to tid   CKD stage II-III -Baseline creatinine 1.0-1.4 -Presenting creatinine 1.52  Lactic acidosis -Likely a degree of volume depletion as well as metformin usage in the setting of worsening renal function -Hold diuretics and monitor clinically--> improving   Chronic diastolic CHF/LE Edema -35/45/6256 echo EF 55-60%, grade 1 DD -Daily weights -Continue bisoprolol -clinically compensated at this time -no peripheral edema on exam -hold diuretics today  NASH cirrhosis -Continue lactulose -We'll ultimately need to restart diuretics, possibly a lower dose -09/21/2015-limited abdominal ultrasound--NO  ascites  Chronic respiratory failure/COPD -Clinically compensated -Continue albuterol when necessary -home oxygen at 3L  Diabetes mellitus type 2 -Hold metformin--may need to discontinue altogether given the patient's fluctuations in renal function -NovoLog sliding scale -07/22/2015 hemoglobin A1c 5.9  Hypokalemia -Repleted -Check magnesium--2.4  Prolonged QTC -Avoid QTC prolonging agents when possible  Macrocytic anemia -B12--846 -folate--pending -Baseline hemoglobin 10-11  Thrombocytopenia -Chronic secondary to her liver disease -Monitor for objective signs of bleeding   Family Communication: updated husband at beside Disposition Plan: Home 09/24/15 if blood cultures remain neg and pt stable   Procedures/Studies: Dg Chest 2 View  09/21/2015  CLINICAL DATA:  Weakness.  Nausea and dehydration. EXAM: CHEST  2 VIEW COMPARISON:  08/07/2015 acute abdomen series. FINDINGS: Multifactorial degradation involves the lateral view. Arm position and overlying soft tissues. Cervical spine fixation. Right shoulder arthroplasty.Midline trachea. Normal heart size. Atherosclerosis in the transverse aorta. No pleural effusion or pneumothorax. Clear lungs. IMPRESSION: No acute cardiopulmonary disease. Aortic atherosclerosis. Electronically Signed   By: Abigail Miyamoto M.D.   On: 09/21/2015 16:10   Ct Head Wo Contrast  09/21/2015  CLINICAL DATA:  PT STS SHE FELL OUT OF BED THIS AM BUT DOESN'T KNOW IF HURT HERSELF OR NOT STS "JUST DOESN'T FEEL GOOD" PT IS A BIT CONFUSED THOUGH AS SHE COULD TELL ME HER BDAY BUT NOT HER NAME EXAM: CT HEAD WITHOUT CONTRAST TECHNIQUE: Contiguous axial images were obtained from the base of the skull through the vertex without intravenous contrast. COMPARISON:  CT 09/03/2014 FINDINGS: No acute intracranial hemorrhage. No focal mass lesion. No CT evidence of acute infarction. No midline shift or mass effect. No hydrocephalus. Basilar cisterns are patent. Paranasal sinuses  and  mastoid air cells are clear. IMPRESSION: No acute intracranial findings. Electronically Signed   By: Helane Gunther.D.  On: 09/21/2015 16:34   US Abdomen Limited  09/21/2015  CLINICAL DATA:  70 year old female with possible ascites EXAM: LIMITED ABDOMEN ULTRASOUND FOR ASCITES TECHNIQUE: Limited ultrasound survey for ascites was performed in all four abdominal quadrants. COMPARISON:  None. FINDINGS: Sonographic evaluation of the 4 quadrants of the abdomen demonstrates no evidence of ascites. IMPRESSION: Negative for ascites. Electronically Signed   By: Jacqulynn Cadet M.D.   On: 09/21/2015 22:30         Subjective: Patient denies fevers, chills, headache, chest pain, dyspnea, nausea, vomiting,abdominal pain, dysuria, hematuria.  No BM today   Objective: Filed Vitals:   09/22/15 1400 09/22/15 2000 09/23/15 0417 09/23/15 1349  BP: 122/66 119/53 128/61 108/65  Pulse: 68 77 80 81  Temp: 98 F (36.7 C) 98.3 F (36.8 C) 98.2 F (36.8 C) 98.3 F (36.8 C)  TempSrc: Oral Oral Oral Oral  Resp: 18 19 19 18   Height:      Weight:      SpO2: 96% 97% 98% 98%    Intake/Output Summary (Last 24 hours) at 09/23/15 1906 Last data filed at 09/23/15 1351  Gross per 24 hour  Intake    840 ml  Output    500 ml  Net    340 ml   Weight change:  Exam:   General:  Pt is alert, follows commands appropriately, not in acute distress  HEENT: No icterus, No thrush, No neck mass, [place]/AT  Cardiovascular: RRR, S1/S2, no rubs, no gallops  Respiratory: CTA bilaterally, no wheezing, no crackles, no rhonchi  Abdomen: Soft/+BS, non tender, non distended, no guarding; no hepatosplenomegaly   Extremities: No edema, No lymphangitis, No petechiae, No rashes, no synovitis; no cyanosis or clubbing   Data Reviewed: Basic Metabolic Panel:  Recent Labs Lab 09/21/15 1234 09/22/15 0550 09/23/15 0647  NA 138 142 139  K 3.3* 3.5 3.9  CL 89* 96* 101  CO2 31 31 28   GLUCOSE 198* 171* 165*  BUN  27* 24* 19  CREATININE 1.52* 1.34* 1.24*  CALCIUM 9.6 10.0 9.1  MG  --  2.4  --    Liver Function Tests:  Recent Labs Lab 09/21/15 1234 09/22/15 0550 09/23/15 0647  AST 34 40 39  ALT 19 22 19   ALKPHOS 87 86 80  BILITOT 1.0 1.0 1.0  PROT 5.7* 6.2* 5.3*  ALBUMIN 3.4* 3.6 3.1*    Recent Labs Lab 09/21/15 1234  LIPASE 37    Recent Labs Lab 09/21/15 1650 09/23/15 0647  AMMONIA 83* 59*   CBC:  Recent Labs Lab 09/21/15 1234 09/22/15 0523 09/22/15 0550  WBC 6.0  --  7.0  HGB 10.2*  --  11.0*  HCT 32.5* 34.0 36.0  MCV 101.6*  --  103.2*  PLT 97*  --  106*   Cardiac Enzymes:  Recent Labs Lab 09/21/15 1650  TROPONINI <0.03   BNP: Invalid input(s): POCBNP CBG:  Recent Labs Lab 09/22/15 1734 09/22/15 2120 09/23/15 0711 09/23/15 1134 09/23/15 1656  GLUCAP 164* 257* 142* 187* 207*    Recent Results (from the past 240 hour(s))  Culture, blood (routine x 2)     Status: None (Preliminary result)   Collection Time: 09/22/15  7:30 AM  Result Value Ref Range Status   Specimen Description BLOOD BLOOD LEFT FOREARM  Final   Special Requests BOTTLES DRAWN AEROBIC ONLY 5CC  Final   Culture NO GROWTH 1 DAY  Final   Report Status PENDING  Incomplete  Culture, blood (routine x 2)  Status: None (Preliminary result)   Collection Time: 09/22/15  7:31 AM  Result Value Ref Range Status   Specimen Description BLOOD LEFT WRIST  Final   Special Requests BOTTLES DRAWN AEROBIC ONLY 5CC  Final   Culture NO GROWTH 1 DAY  Final   Report Status PENDING  Incomplete     Scheduled Meds: . allopurinol  300 mg Oral Daily  . amitriptyline  10 mg Oral BID  . aspirin  81 mg Oral Daily  . bisoprolol  10 mg Oral Daily  . citalopram  40 mg Oral Daily  . ferrous sulfate  325 mg Oral BID  . gabapentin  300 mg Oral TID  . insulin aspart  0-5 Units Subcutaneous QHS  . insulin aspart  0-9 Units Subcutaneous TID WC  . lactulose  20 g Oral BID  . pantoprazole  40 mg Oral BID AC   . tamoxifen  20 mg Oral Daily   Continuous Infusions:    Kerry Vinton, DO  [facility] Pager [phone]  If 7PM-7AM, please contact night-coverage [email or web address] Password [email or web address] 09/23/2015, 7:06 PM   LOS: 2 days

## 2015-09-24 DIAGNOSIS — J449 Chronic obstructive pulmonary disease, unspecified: Secondary | ICD-10-CM | POA: Diagnosis not present

## 2015-09-24 DIAGNOSIS — J9611 Chronic respiratory failure with hypoxia: Secondary | ICD-10-CM | POA: Diagnosis not present

## 2015-09-24 DIAGNOSIS — K7290 Hepatic failure, unspecified without coma: Secondary | ICD-10-CM | POA: Diagnosis not present

## 2015-09-24 DIAGNOSIS — E1122 Type 2 diabetes mellitus with diabetic chronic kidney disease: Secondary | ICD-10-CM | POA: Diagnosis not present

## 2015-09-24 DIAGNOSIS — J9612 Chronic respiratory failure with hypercapnia: Secondary | ICD-10-CM | POA: Diagnosis not present

## 2015-09-24 LAB — GLUCOSE, CAPILLARY
Glucose-Capillary: 114 mg/dL — ABNORMAL HIGH (ref 65–99)
Glucose-Capillary: 266 mg/dL — ABNORMAL HIGH (ref 65–99)

## 2015-09-24 LAB — AMMONIA: Ammonia: 44 umol/L — ABNORMAL HIGH (ref 9–35)

## 2015-09-24 LAB — BASIC METABOLIC PANEL
Anion gap: 11 (ref 5–15)
BUN: 22 mg/dL — AB (ref 6–20)
CALCIUM: 8.7 mg/dL — AB (ref 8.9–10.3)
CO2: 28 mmol/L (ref 22–32)
CREATININE: 1.2 mg/dL — AB (ref 0.44–1.00)
Chloride: 99 mmol/L — ABNORMAL LOW (ref 101–111)
GFR calc Af Amer: 53 mL/min — ABNORMAL LOW (ref >60)
GFR, EST NON AFRICAN AMERICAN: 45 mL/min — AB (ref >60)
GLUCOSE: 123 mg/dL — AB (ref 65–99)
Potassium: 3.6 mmol/L (ref 3.5–5.1)
Sodium: 138 mmol/L (ref 135–145)

## 2015-09-24 MED ORDER — IPRATROPIUM-ALBUTEROL 0.5-2.5 (3) MG/3ML IN SOLN: 3.0000 mL | Freq: Three times a day (TID) | RESPIRATORY_TRACT | Status: DC

## 2015-09-24 MED ORDER — LACTULOSE 10 GM/15ML PO SOLN: 20.0000 g | Freq: Two times a day (BID) | ORAL | Status: DC

## 2015-09-24 MED ORDER — FUROSEMIDE 80 MG PO TABS
80.0000 mg | ORAL_TABLET | Freq: Every day | ORAL | Status: DC
  Administered 2015-09-24: 80 mg via ORAL
  Filled 2015-09-24: qty 1

## 2015-09-24 MED ORDER — ALPRAZOLAM 0.25 MG PO TABS: 0.2500 mg | ORAL_TABLET | Freq: Three times a day (TID) | ORAL | Status: DC | PRN

## 2015-09-24 MED ORDER — CITALOPRAM HYDROBROMIDE 20 MG PO TABS: 20.0000 mg | ORAL_TABLET | Freq: Every day | ORAL | Status: DC

## 2015-09-24 MED ORDER — IPRATROPIUM-ALBUTEROL 0.5-2.5 (3) MG/3ML IN SOLN
3.0000 mL | RESPIRATORY_TRACT | Status: DC | PRN
  Administered 2015-09-24: 3 mL via RESPIRATORY_TRACT
  Filled 2015-09-24: qty 3

## 2015-09-24 NOTE — Discharge Summary (Signed)
Physician Discharge Summary  HOA BRIGGS TYO:[id number] DOB: Mar 04, 1946 DOA: 09/21/2015  PCP: Alesia Richards, MD  Admit date: 09/21/2015 Discharge date: 09/24/2015  Recommendations for Outpatient Follow-up:  1. Pt will need to follow up with PCP in 2 weeks post discharge 2. Please obtain BMP and ammonia in one week  Discharge Diagnoses:   Acute encephalopathy/hepatic encephalopathy -Ammonia 83 at the time of admission--> 59-->44 -Continue lactulose -CT brain negative -Urinalysis is negative for pyuria -husband states she is back to baseline -Serum B12--846 -TSH 1.152  -home with lactulose bid to have 1-2 BMs per day  CKD stage II-III -Baseline creatinine 1.0-1.4 -Presenting creatinine 1.52 -serum creatinine 1.20 at time of d/c  Lactic acidosis -Likely a degree of volume depletion as well as metformin usage in the setting of worsening renal function -Hold diuretics and monitor clinically--> improving   Chronic diastolic CHF/LE Edema -74/14/2395 echo EF 55-60%, grade 1 DD -Daily weights -Continue bisoprolol -clinically compensated at this time -no peripheral edema on exam -restart home dose furosemide but hold zaroxolyn   NASH cirrhosis -Continue lactulose -09/21/2015-limited abdominal ultrasound--NO ascites  Chronic respiratory failure/COPD -Clinically compensated -Continue albuterol when necessary -home oxygen at 3L  Diabetes mellitus type 2 -Hold metformin--may need to discontinue altogether given the patient's fluctuations in renal function and lactic acidosis -NovoLog sliding scale -07/22/2015 hemoglobin A1c 5.9 -follow up with PCP for adjustment of regimen  Hypokalemia -Repleted -Check magnesium--2.4  Prolonged QTC -Avoid QTC prolonging agents when possible  Macrocytic anemia -B12--846 -folate--pending -Baseline hemoglobin 10-11  Thrombocytopenia -Chronic secondary to her liver disease -Monitor for objective signs of  bleeding  Discharge Condition: stable  Disposition:   Diet:low sodium Wt Readings from Last 3 Encounters:  09/21/15 83.099 kg (183 lb 3.2 oz)  09/13/15 84.278 kg (185 lb 12.8 oz)  09/05/15 81.466 kg (179 lb 9.6 oz)    History of present illness:  70 year old female with a history of NASH cirrhosis with varices, hypertension, CKD, chronic respiratory failure/COPD on chronic home oxygen presented with 4 day history of anorexia, increasing generalized weakness, nausea, and confusion. According to the patient's husband, the patient had some myoclonic jerks all chicken coffee as well as having hallucinations. The patient recently followed up with Dr. Percival Spanish on 09/06/15 who felt that the patient's peripheral edema while may be partly attributable to her diastolic dysfunction, other etiologies need to be considered. He also felt that overdiuresis continues to be a concern and other nonpharmacologic therapies for her peripheral edema may need to be pursued. At the time of her office visit, the patient's weight was 179 pounds. More recently, her primary care provider started the patient on metolazone 5 mg daily for peripheral edema.  Discharge Exam: Filed Vitals:   09/23/15 2025 09/24/15 0542  BP: 126/43 124/64  Pulse: 90 84  Temp: 99 F (37.2 C) 97.7 F (36.5 C)  Resp: 19 18   Filed Vitals:   09/23/15 0417 09/23/15 1349 09/23/15 2025 09/24/15 0542  BP: 128/61 108/65 126/43 124/64  Pulse: 80 81 90 84  Temp: 98.2 F (36.8 C) 98.3 F (36.8 C) 99 F (37.2 C) 97.7 F (36.5 C)  TempSrc: Oral Oral Oral Oral  Resp: 19 18 19 18   Height:      Weight:      SpO2: 98% 98% 98% 99%   General: A&O x 3, NAD, pleasant, cooperative Cardiovascular: RRR, no rub, no gallop, no S3 Respiratory: diminished breath sounds without wheeze Abdomen:soft, nontender, nondistended, positive bowel sounds Extremities: No edema, No  lymphangitis, no petechiae  Discharge Instructions      Discharge  Instructions    Diet - low sodium heart healthy    Complete by:  As directed      Increase activity slowly    Complete by:  As directed             Medication List    STOP taking these medications        metFORMIN 500 MG 24 hr tablet  Commonly known as:  GLUCOPHAGE-XR     metolazone 5 MG tablet  Commonly known as:  ZAROXOLYN      TAKE these medications        acetaminophen-codeine 300-30 MG tablet  Commonly known as:  TYLENOL #3  TAKE ONE-HALF TO ONE TABLET BY MOUTH FOUR TIMES DAILY AS NEEDED     allopurinol 300 MG tablet  Commonly known as:  ZYLOPRIM  1/2-1 tablet daily for gout     ALPRAZolam 0.5 MG tablet  Commonly known as:  XANAX  1/2-1 pil up to 3 times a day for anxiety     amitriptyline 10 MG tablet  Commonly known as:  ELAVIL  Take 1 tablet (10 mg total) by mouth 3 (three) times daily as needed.     aspirin 81 MG tablet  Take 81 mg by mouth daily.     bisoprolol 10 MG tablet  Commonly known as:  ZEBETA  Take 1 tablet (10 mg total) by mouth daily.     Cholecalciferol 5000 units capsule  Take 10,000 Units by mouth daily.     citalopram 20 MG tablet  Commonly known as:  CELEXA  Take 1 tablet (20 mg total) by mouth daily.     cyclobenzaprine 10 MG tablet  Commonly known as:  FLEXERIL  Take 1 tablet (10 mg total) by mouth 3 (three) times daily as needed for muscle spasms.     ferrous sulfate 325 (65 FE) MG tablet  TAKE 1 TABLET TWICE DAILY     Fish Oil 1000 MG Caps  Take 1,000 mg by mouth 2 (two) times daily.     furosemide 40 MG tablet  Commonly known as:  LASIX  Take 1-2 tablets (40-80 mg total) by mouth 2 (two) times daily. 80 mg in am and 40 mg in pm     gabapentin 300 MG capsule  Commonly known as:  NEURONTIN  take 1 capsule by mouth in the morning, 1 capsule in the afternoon and 1-3 capsules at night     guaiFENesin 100 MG/5ML liquid  Commonly known as:  ROBITUSSIN  Take 5-10 mLs (100-200 mg total) by mouth every 4 (four) hours as  needed for cough.     ipratropium 0.02 % nebulizer solution  Commonly known as:  ATROVENT  use 1 vial in nebulizer EVERY 4 HOURS AS NEEDED FOR WHEEZING OR SHORTNESS OF BREATH     lactulose 10 GM/15ML solution  Commonly known as:  CHRONULAC  Take 30 mLs (20 g total) by mouth 2 (two) times daily.     Magnesium 250 MG Tabs  Take 250 mg by mouth daily at 6 (six) AM.     OVER THE COUNTER MEDICATION  Take 1-2 tablets by mouth as needed (constipation). Natural Veg Laxative     OXYGEN  Inhale 3 L/min into the lungs daily as needed (On exertion and at home).     pantoprazole 40 MG tablet  Commonly known as:  PROTONIX  Take 1 tablet (40 mg total)  by mouth 2 (two) times daily.     potassium chloride 10 MEQ tablet  Commonly known as:  K-DUR  Take 2 tablets (20 mEq total) by mouth daily.     spironolactone 25 MG tablet  Commonly known as:  ALDACTONE  TAKE 1 TABLET BY MOUTH THREE TIMES DAILY FOR FLUIDS AND SWELLING     tamoxifen 20 MG tablet  Commonly known as:  NOLVADEX  Take 1 tablet (20 mg total) by mouth daily.         The results of significant diagnostics from this hospitalization (including imaging, microbiology, ancillary and laboratory) are listed below for reference.    Significant Diagnostic Studies: Dg Chest 2 View  09/21/2015  CLINICAL DATA:  Weakness.  Nausea and dehydration. EXAM: CHEST  2 VIEW COMPARISON:  08/07/2015 acute abdomen series. FINDINGS: Multifactorial degradation involves the lateral view. Arm position and overlying soft tissues. Cervical spine fixation. Right shoulder arthroplasty.Midline trachea. Normal heart size. Atherosclerosis in the transverse aorta. No pleural effusion or pneumothorax. Clear lungs. IMPRESSION: No acute cardiopulmonary disease. Aortic atherosclerosis. Electronically Signed   By: Abigail Miyamoto M.D.   On: 09/21/2015 16:10   Ct Head Wo Contrast  09/21/2015  CLINICAL DATA:  PT STS SHE FELL OUT OF BED THIS AM BUT DOESN'T KNOW IF HURT  HERSELF OR NOT STS "JUST DOESN'T FEEL GOOD" PT IS A BIT CONFUSED THOUGH AS SHE COULD TELL ME HER BDAY BUT NOT HER NAME EXAM: CT HEAD WITHOUT CONTRAST TECHNIQUE: Contiguous axial images were obtained from the base of the skull through the vertex without intravenous contrast. COMPARISON:  CT 09/03/2014 FINDINGS: No acute intracranial hemorrhage. No focal mass lesion. No CT evidence of acute infarction. No midline shift or mass effect. No hydrocephalus. Basilar cisterns are patent. Paranasal sinuses and  mastoid air cells are clear. IMPRESSION: No acute intracranial findings. Electronically Signed   By: Suzy Bouchard M.D.   On: 09/21/2015 16:34   US Abdomen Limited  09/21/2015  CLINICAL DATA:  70 year old female with possible ascites EXAM: LIMITED ABDOMEN ULTRASOUND FOR ASCITES TECHNIQUE: Limited ultrasound survey for ascites was performed in all four abdominal quadrants. COMPARISON:  None. FINDINGS: Sonographic evaluation of the 4 quadrants of the abdomen demonstrates no evidence of ascites. IMPRESSION: Negative for ascites. Electronically Signed   By: Jacqulynn Cadet M.D.   On: 09/21/2015 22:30     Microbiology: Recent Results (from the past 240 hour(s))  Culture, blood (routine x 2)     Status: None (Preliminary result)   Collection Time: 09/22/15  7:30 AM  Result Value Ref Range Status   Specimen Description BLOOD BLOOD LEFT FOREARM  Final   Special Requests BOTTLES DRAWN AEROBIC ONLY 5CC  Final   Culture NO GROWTH 1 DAY  Final   Report Status PENDING  Incomplete  Culture, blood (routine x 2)     Status: None (Preliminary result)   Collection Time: 09/22/15  7:31 AM  Result Value Ref Range Status   Specimen Description BLOOD LEFT WRIST  Final   Special Requests BOTTLES DRAWN AEROBIC ONLY 5CC  Final   Culture NO GROWTH 1 DAY  Final   Report Status PENDING  Incomplete     Labs: Basic Metabolic Panel:  Recent Labs Lab 09/21/15 1234 09/22/15 0550 09/23/15 0647 09/24/15 0533  NA  138 142 139 138  K 3.3* 3.5 3.9 3.6  CL 89* 96* 101 99*  CO2 31 31 28 28   GLUCOSE 198* 171* 165* 123*  BUN 27* 24* 19 22*  CREATININE 1.52* 1.34* 1.24* 1.20*  CALCIUM 9.6 10.0 9.1 8.7*  MG  --  2.4  --   --    Liver Function Tests:  Recent Labs Lab 09/21/15 1234 09/22/15 0550 09/23/15 0647  AST 34 40 39  ALT 19 22 19   ALKPHOS 87 86 80  BILITOT 1.0 1.0 1.0  PROT 5.7* 6.2* 5.3*  ALBUMIN 3.4* 3.6 3.1*    Recent Labs Lab 09/21/15 1234  LIPASE 37    Recent Labs Lab 09/21/15 1650 09/23/15 0647 09/24/15 0533  AMMONIA 83* 59* 44*   CBC:  Recent Labs Lab 09/21/15 1234 09/22/15 0523 09/22/15 0550  WBC 6.0  --  7.0  HGB 10.2*  --  11.0*  HCT 32.5* 34.0 36.0  MCV 101.6*  --  103.2*  PLT 97*  --  106*   Cardiac Enzymes:  Recent Labs Lab 09/21/15 1650  TROPONINI <0.03   BNP: Invalid input(s): POCBNP CBG:  Recent Labs Lab 09/23/15 1134 09/23/15 1656 09/23/15 2137 09/24/15 0715 09/24/15 1123  GLUCAP 187* 207* 230* 114* 266*    Time coordinating discharge:  Greater than 30 minutes  Signed:  Kahli Fitzgerald, DO [facility] Pager: [phone] 09/24/2015, 11:48 AM

## 2015-09-24 NOTE — Discharge Planning (Signed)
AVS to patient and husband who verbalizes understanding.  Dc'd per w/c to private car with all personal belongings accompanied by husband at 29.

## 2015-09-26 ENCOUNTER — Other Ambulatory Visit: Payer: Self-pay

## 2015-09-26 DIAGNOSIS — I509 Heart failure, unspecified: Secondary | ICD-10-CM

## 2015-09-26 NOTE — Patient Outreach (Signed)
[facility]) Care Management  09/26/2015  [name] 1945-10-20 [id number]   Telephone Screen  Referral Date: 09/14/15 Referral Source: [employer](HTA) Referral Reason: "RN for education/support. Patient has dx of CHF, recent wgt gain r/t diet. Spouse stated patient drinking V8 and saltine crackers daily. Hx of HTN, COPD, CKD and cirrhosis of liver"   Outreach attempt #2 to patient. Patient gave verbal consent to speak with spouse who manages her care.   Social: Patient resides in her home with spouse. He assists her with ADLs/IADLS. Patient uses walker primarily for ambulation. Other DME in the home includes: oxygen and scooter. He states patient fell on last week trying to get out of bed. Spouse provides transportation for patient.  Conditions: Patient has h/o: NASH/cirrhosis, edema, CHF, esophageal varices,CKD, DM, COPD(oxygen dependent), HTN , HLD and fibromyalgia. She was inpatient from 2/15-2/18 for elevated ammonia levels. She was also inpatient last month.  Spouse states while hospitalized her diuretics were not given which has called wgt gain. Since discharge home he has since resumed diuretics and is monitoring patient's weight.   Medications: Spouse denies any issues with affording meds.   Consent: Patient/spouse gave verbal consent for [facility] services.  Plan: RN CM will notify [facility] administrative assistant of case status. RN CM will send [facility] community referral for TOC case. RN CM provided spouse with [facility] 24hr Nurse Line contact info. RN CM confirmed spouse is aware of when to seek medical attention for changes in condition.  Enzo Montgomery, RN,BSN,CCM [employer] Telephonic Care Management Coordinator Direct Phone: [phone] Toll Free: [phone] Fax: [phone]

## 2015-09-27 ENCOUNTER — Encounter: Payer: Self-pay | Admitting: Internal Medicine

## 2015-09-27 ENCOUNTER — Ambulatory Visit: Payer: PPO | Admitting: Internal Medicine

## 2015-09-27 ENCOUNTER — Other Ambulatory Visit: Payer: Self-pay

## 2015-09-27 ENCOUNTER — Other Ambulatory Visit: Payer: Self-pay | Admitting: Physician Assistant

## 2015-09-27 VITALS — BP 132/68 | HR 74 | Temp 98.2°F | Resp 18 | Ht 62.5 in | Wt 185.0 lb

## 2015-09-27 DIAGNOSIS — K7581 Nonalcoholic steatohepatitis (NASH): Secondary | ICD-10-CM | POA: Diagnosis not present

## 2015-09-27 DIAGNOSIS — J418 Mixed simple and mucopurulent chronic bronchitis: Secondary | ICD-10-CM | POA: Diagnosis not present

## 2015-09-27 DIAGNOSIS — K7290 Hepatic failure, unspecified without coma: Secondary | ICD-10-CM | POA: Diagnosis not present

## 2015-09-27 DIAGNOSIS — K7460 Unspecified cirrhosis of liver: Secondary | ICD-10-CM

## 2015-09-27 DIAGNOSIS — J9611 Chronic respiratory failure with hypoxia: Secondary | ICD-10-CM | POA: Diagnosis not present

## 2015-09-27 DIAGNOSIS — J9612 Chronic respiratory failure with hypercapnia: Secondary | ICD-10-CM

## 2015-09-27 DIAGNOSIS — K7682 Hepatic encephalopathy: Secondary | ICD-10-CM

## 2015-09-27 LAB — CULTURE, BLOOD (ROUTINE X 2)
CULTURE: NO GROWTH
Culture: NO GROWTH

## 2015-09-27 MED ORDER — GABAPENTIN 300 MG PO CAPS: ORAL_CAPSULE | ORAL | Status: DC

## 2015-09-27 MED ORDER — TRAMADOL HCL 50 MG PO TABS: 50.0000 mg | ORAL_TABLET | Freq: Four times a day (QID) | ORAL | Status: DC | PRN

## 2015-09-27 NOTE — Patient Outreach (Signed)
Initial telephone assessment for transition of care week #1 and assessment of need for community care coordination. patient states her date of birth and address to satisfy HIPPA identifiers. Patient was agreeable to this initial assessment.  Patient identified her husband as her primary caregiver and who assist her with her ADLs and IADLs. Patient also states her husband is responsible for laying out her medications   Patient stated she would like to be more active and the pain in her shoulders and back better as case management goals. Patient and this RNCM plan to explore her eligibility for the [facility] Medicaid PCS Program more during the initial home visit next week

## 2015-09-27 NOTE — Progress Notes (Signed)
Subjective:    Patient ID: [name], female    DOB: [birth year], 70 y.o.   MRN: [id number]  HPI  Patient with history of NASH, COPD with chronic O2 use, and likely diastolic congestive heart failure who presents for post hospital visit.  She is doing okay.  She is going to see somebody at [facility] in April of 2017.  She used to see Dr. Deatra Ina.  She is doing okay with the lactulose currently she is having 2 bowel movements per day.  She is having an increase in weight. Her husband reports that they never restarted the fluid pills when they left the hospital.  Her usual weight weight prior to the hospitalization was 182.  Weight after hospitalization has increased to 185 per our scales.  She is currently taking 80 mg of lasix in the morning and 80 mg in the evening.  She is taking potassium with the lasix.  She was taken off the metolazone.  She feels swollen and bloated.    Review of Systems  Constitutional: Negative for fever, chills and fatigue.  Respiratory: Positive for shortness of breath. Negative for cough, chest tightness and wheezing.   Gastrointestinal: Positive for abdominal pain and diarrhea. Negative for nausea, vomiting, constipation, blood in stool and anal bleeding.  Genitourinary: Negative for dysuria, urgency, hematuria and difficulty urinating.       Objective:   Physical Exam  Constitutional: She is oriented to person, place, and time. She appears ill. No distress. Nasal cannula in place.  HENT:  Head: Normocephalic.  Mouth/Throat: Oropharynx is clear and moist. No oropharyngeal exudate.  Eyes: Conjunctivae are normal. No scleral icterus.  Neck: Normal range of motion. Neck supple. No JVD present. No thyromegaly present.  Cardiovascular: Normal rate, regular rhythm, normal heart sounds and intact distal pulses.  Exam reveals no gallop and no friction rub.   No murmur heard. Trace peripheral edema  Pulmonary/Chest: Effort normal and breath sounds normal. No  respiratory distress. She has no wheezes. She has no rales. She exhibits no tenderness.  Lung sounds distant A&P.  Pulse [place] O2 on at 3L  Abdominal: Soft. Normal appearance and bowel sounds are normal. She exhibits no distension and no mass. There is generalized tenderness. There is no rebound and no guarding.  No fluid wave on abdomen  Musculoskeletal: Normal range of motion.  Lymphadenopathy:    She has no cervical adenopathy.  Neurological: She is alert and oriented to person, place, and time. No cranial nerve deficit. Coordination normal.  Skin: Skin is warm and dry. She is not diaphoretic.  Psychiatric: She has a normal mood and affect. Her behavior is normal. Judgment and thought content normal.  Nursing note and vitals reviewed.   Filed Vitals:   09/27/15 1618  BP: 132/68  Pulse: 74  Temp: 98.2 F (36.8 C)  Resp: 18         Assessment & Plan:    1. Liver cirrhosis secondary to NASH -MELD score of 10 -continue lactulose BID -continue lasix 80 mg BID -zaroxolyn 2.5 mg Wednesday and Sunday only -hold zaroxolyn if more than 2 lbs of weight loss with use of lasix only -increase to 60 meq of potassium on days with zaroxolyn Currently has appointment set up with [facility] - BASIC METABOLIC PANEL WITH GFR - Hepatic function panel - CBC with Differential/Platelet - AMMONIA   Given severe COPD with cirrhosis secondary to NASH with esophageal varices history and likely a component of mild heart  failure secondary to Grade 1 diastolic dysfunction feel that this patient likely will have an increased decline in function.  She already cannot do most tasks of daily living and her husband is taking excellent care of her.  She has had multiple hospitalizations and at this point we are maximazing medical therapy with no relief of symptoms.  Will attempt to contact hospice for evaluation of care and possible palliative treatment.  Patient seemed very interested, but husband is hesitant.   They would like to meet with somebody to see what is available to them.  Will recheck in 1 week.

## 2015-09-27 NOTE — Patient Instructions (Signed)
Please start taking metolazone 1/2 tablet on Wednesday and Sundays.  Please continue to take your lasix 80 mg in the morning and the evening.  On days when you take the metolazone please take 60 mEq of the potassium tablet.  On days when you just take lasix stay at the 20 mEq.   Hold the metolazone if you lose more than 2lbs in 1 day on lasix alone.    Please monitor weight daily at home.    Please stop tylenol #3.    Please continue the lactulose. Your goal is to have 2 bowel movements a day.

## 2015-09-28 LAB — HEPATIC FUNCTION PANEL
ALT: 22 U/L (ref 6–29)
AST: 35 U/L (ref 10–35)
Albumin: 3.3 g/dL — ABNORMAL LOW (ref 3.6–5.1)
Alkaline Phosphatase: 96 U/L (ref 33–130)
BILIRUBIN INDIRECT: 0.5 mg/dL (ref 0.2–1.2)
Bilirubin, Direct: 0.2 mg/dL (ref <=0.2)
TOTAL PROTEIN: 5.3 g/dL — AB (ref 6.1–8.1)
Total Bilirubin: 0.7 mg/dL (ref 0.2–1.2)

## 2015-09-28 LAB — CBC WITH DIFFERENTIAL/PLATELET
BASOS ABS: 0.1 10*3/uL (ref 0.0–0.1)
BASOS PCT: 1 % (ref 0–1)
EOS ABS: 1.3 10*3/uL — AB (ref 0.0–0.7)
Eosinophils Relative: 24 % — ABNORMAL HIGH (ref 0–5)
HCT: 28.6 % — ABNORMAL LOW (ref 36.0–46.0)
Hemoglobin: 9.5 g/dL — ABNORMAL LOW (ref 12.0–15.0)
LYMPHS ABS: 1.2 10*3/uL (ref 0.7–4.0)
Lymphocytes Relative: 22 % (ref 12–46)
MCH: 33.1 pg (ref 26.0–34.0)
MCHC: 33.2 g/dL (ref 30.0–36.0)
MCV: 99.7 fL (ref 78.0–100.0)
MPV: 11.6 fL (ref 8.6–12.4)
Monocytes Absolute: 0.4 10*3/uL (ref 0.1–1.0)
Monocytes Relative: 7 % (ref 3–12)
NEUTROS PCT: 46 % (ref 43–77)
Neutro Abs: 2.6 10*3/uL (ref 1.7–7.7)
PLATELETS: 93 10*3/uL — AB (ref 150–400)
RBC: 2.87 MIL/uL — AB (ref 3.87–5.11)
RDW: 16.4 % — ABNORMAL HIGH (ref 11.5–15.5)
WBC: 5.6 10*3/uL (ref 4.0–10.5)

## 2015-09-28 LAB — BASIC METABOLIC PANEL WITH GFR
BUN: 21 mg/dL (ref 7–25)
CHLORIDE: 98 mmol/L (ref 98–110)
CO2: 32 mmol/L — ABNORMAL HIGH (ref 20–31)
CREATININE: 1.25 mg/dL — AB (ref 0.50–0.99)
Calcium: 9 mg/dL (ref 8.6–10.4)
GFR, EST AFRICAN AMERICAN: 51 mL/min — AB (ref >=60)
GFR, Est Non African American: 44 mL/min — ABNORMAL LOW (ref >=60)
Glucose, Bld: 121 mg/dL — ABNORMAL HIGH (ref 65–99)
POTASSIUM: 4.3 mmol/L (ref 3.5–5.3)
SODIUM: 140 mmol/L (ref 135–146)

## 2015-09-28 LAB — AMMONIA: AMMONIA: 65 umol/L — AB (ref 16–53)

## 2015-10-03 ENCOUNTER — Other Ambulatory Visit: Payer: Self-pay

## 2015-10-03 ENCOUNTER — Telehealth: Payer: Self-pay | Admitting: Internal Medicine

## 2015-10-03 MED ORDER — CEPHALEXIN 500 MG PO CAPS: 500.0000 mg | ORAL_CAPSULE | Freq: Four times a day (QID) | ORAL | Status: DC

## 2015-10-03 NOTE — Patient Outreach (Signed)
This RNCM return telepone call to patient's husband who provided patinet's date of birth and address to satisfy HIPPA. Patient consented for husband to receive and receive information.  Husband advised this RNCM it is kind of tricky to get to his house, so he wanted to provided this RNCM with clear directions on where to come and where to part,  [name] also given brief description of purpose of visit, benefits offered by [facility] and ownership of [facility] as directed in the [place].   Plan: Home visit later this week

## 2015-10-03 NOTE — Telephone Encounter (Signed)
Patient's husband called stating that the patient has arm swelling and redness bilaterally.  She was recently in the office and had bruising bilaterally due to difficulty starting IVs and drawing blood in the hospital.  Will send in Keflex for possible cellulitis coverage and do recommend warm compresses to the skin and will have her keep her Wednesday appointment with Dr. Melford Aase.  If she has worsening fevers, nausea, vomiting, or any other concerning factors please call the office and if you cannot get a hold of the office please go to the ER.

## 2015-10-05 ENCOUNTER — Other Ambulatory Visit: Payer: Self-pay

## 2015-10-05 ENCOUNTER — Ambulatory Visit (INDEPENDENT_AMBULATORY_CARE_PROVIDER_SITE_OTHER): Payer: PPO | Admitting: Internal Medicine

## 2015-10-05 ENCOUNTER — Encounter: Payer: Self-pay | Admitting: Internal Medicine

## 2015-10-05 ENCOUNTER — Other Ambulatory Visit: Payer: Self-pay | Admitting: *Deleted

## 2015-10-05 ENCOUNTER — Other Ambulatory Visit: Payer: Self-pay | Admitting: Internal Medicine

## 2015-10-05 VITALS — BP 110/64 | HR 80 | Temp 97.5°F | Resp 16

## 2015-10-05 DIAGNOSIS — K7581 Nonalcoholic steatohepatitis (NASH): Principal | ICD-10-CM

## 2015-10-05 DIAGNOSIS — K7682 Hepatic encephalopathy: Secondary | ICD-10-CM

## 2015-10-05 DIAGNOSIS — Z79899 Other long term (current) drug therapy: Secondary | ICD-10-CM | POA: Diagnosis not present

## 2015-10-05 DIAGNOSIS — L03114 Cellulitis of left upper limb: Secondary | ICD-10-CM

## 2015-10-05 DIAGNOSIS — K7290 Hepatic failure, unspecified without coma: Secondary | ICD-10-CM

## 2015-10-05 DIAGNOSIS — K7460 Unspecified cirrhosis of liver: Secondary | ICD-10-CM

## 2015-10-05 DIAGNOSIS — R609 Edema, unspecified: Secondary | ICD-10-CM | POA: Diagnosis not present

## 2015-10-05 LAB — BASIC METABOLIC PANEL WITH GFR
BUN: 22 mg/dL (ref 7–25)
CALCIUM: 9 mg/dL (ref 8.6–10.4)
CO2: 32 mmol/L — AB (ref 20–31)
CREATININE: 1.27 mg/dL — AB (ref 0.50–0.99)
Chloride: 89 mmol/L — ABNORMAL LOW (ref 98–110)
GFR, EST AFRICAN AMERICAN: 50 mL/min — AB (ref >=60)
GFR, EST NON AFRICAN AMERICAN: 43 mL/min — AB (ref >=60)
GLUCOSE: 110 mg/dL — AB (ref 65–99)
Potassium: 3.9 mmol/L (ref 3.5–5.3)
SODIUM: 135 mmol/L (ref 135–146)

## 2015-10-05 LAB — CBC WITH DIFFERENTIAL/PLATELET
BASOS PCT: 0 % (ref 0–1)
Basophils Absolute: 0 10*3/uL (ref 0.0–0.1)
Eosinophils Absolute: 1.7 10*3/uL — ABNORMAL HIGH (ref 0.0–0.7)
Eosinophils Relative: 23 % — ABNORMAL HIGH (ref 0–5)
HCT: 29.7 % — ABNORMAL LOW (ref 36.0–46.0)
Hemoglobin: 9.8 g/dL — ABNORMAL LOW (ref 12.0–15.0)
LYMPHS PCT: 18 % (ref 12–46)
Lymphs Abs: 1.3 10*3/uL (ref 0.7–4.0)
MCH: 32.3 pg (ref 26.0–34.0)
MCHC: 33 g/dL (ref 30.0–36.0)
MCV: 98 fL (ref 78.0–100.0)
MONO ABS: 0.3 10*3/uL (ref 0.1–1.0)
MONOS PCT: 4 % (ref 3–12)
MPV: 11.5 fL (ref 8.6–12.4)
NEUTROS ABS: 4.1 10*3/uL (ref 1.7–7.7)
Neutrophils Relative %: 55 % (ref 43–77)
Platelets: 103 10*3/uL — ABNORMAL LOW (ref 150–400)
RBC: 3.03 MIL/uL — AB (ref 3.87–5.11)
RDW: 16.1 % — AB (ref 11.5–15.5)
WBC: 7.4 10*3/uL (ref 4.0–10.5)

## 2015-10-05 MED ORDER — LACTULOSE 10 GM/15ML PO SOLN: ORAL | Status: DC

## 2015-10-05 MED ORDER — DOXYCYCLINE HYCLATE 100 MG PO CAPS: ORAL_CAPSULE | ORAL | Status: DC

## 2015-10-05 NOTE — Progress Notes (Signed)
Subjective:    Patient ID: [name], female    DOB: Nov 17, 1945, 70 y.o.   MRN: [id number]  HPI This unfortunate 70 yo MWF with multiple endstage dz's including O2 dependent COPD, NASH w/ Cirrhosis/Ascites/Esophag. Varices, and also has  HTN, HLD, PreDM w/Insulin Resistance, GERD, Vit D deficiency has had several recent hospitalizations for sepsis, dehydration and has also been closely followed for management of her fragile fluid balance status with venous insufficiency and ascites compromised by gentle diuresis with low dose Zaroxolyn facilitating her Spironolsctone and Furosemide . She has lost ~ 6# over the last week dropping from 187# to 181# and she returns today with and obvious cellulutis of the LUE to the deltoid area felt likely due to her multiple venipunctures during recent hospitalizations.  She denies any fever, chills, or sweats.   Medication Sig  . allopurinol (ZYLOPRIM) 300 MG tablet 1/2-1 tablet daily for gout (Patient taking differently: Take 300 mg by mouth daily. )  . ALPRAZolam (XANAX) 0.5 MG tablet Patient taking differently: Take 0.25-5 mg  3  times daily as needed for anxiety  . aspirin 81 MG tablet Take 81 mg by mouth daily.  . bisoprolol (ZEBETA) 10 MG tablet Take 1 tablet (10 mg total) by mouth daily.  . cephALEXin (KEFLEX) 500 MG capsule Take 1 capsule (500 mg total) by mouth 4 (four) times daily.  . Cholecalciferol 5000 units capsule Take 10,000 Units by mouth daily.  . citalopram (CELEXA) 20 MG tablet Take 1 tablet (20 mg total) by mouth daily.  . cyclobenzaprine (FLEXERIL) 10 MG tablet Take 1 tablet (10 mg total) by mouth 3 (three) times daily as needed for muscle spasms.  . ferrous sulfate 325 (65 FE) MG tablet Patient taking differently: TAKE 1 TABLET ONCE DAILY  . furosemide (LASIX) 40 MG tablet Patient taking differently: Take 80 mg by mouth 2 (two) times daily.   Marland Kitchen gabapentin 300 MG capsule take 1 capsule by mouth in the morning, 1 capsule in the afternoon  and 1-3 capsules at night  . guaiFENesin  100 MG/5ML liquid Take 5-10 mLs (100-200 mg total) by mouth every 4 (four) hours as needed for cough.  Marland Kitchen ipratropium (ATROVENT) 0.02 % neb soln use 1 vial in nebulizer EVERY 4 HOURS AS NEEDED FOR WHEEZING OR SHORTNESS OF BREATH  . Magnesium 250 MG TABS Take 250 mg by mouth daily at 6 (six) AM.  . Omega-3 Fatty Acids (FISH OIL) 1000 MG  Take 1,000 mg by mouth 2 (two) times daily.  Orie Fisherman Veg Laxative Take 1-2 tablets by mouth as needed (constipation).   . OXYGEN Inhale 3 L/min into the lungs daily as needed (On exertion and at home).   . pantoprazole (PROTONIX) 40 MG tablet Take 1 tablet (40 mg total) by mouth 2 (two) times daily.  . potassium chloride (K-DUR) 10 MEQ tablet Take 2 tablets (20 mEq total) by mouth daily.  Marland Kitchen spironolactone (ALDACTONE) 25 MG tablet Patient taking differently: TAKE 1 TABLET BY MOUTH TWICE A DAY  . tamoxifen (NOLVADEX) 20 MG tablet Take 1 tablet (20 mg total) by mouth daily.  . traMADol (ULTRAM) 50 MG tablet Take 1 tablet (50 mg total) by mouth every 6 (six) hours as needed.  Marland Kitchen amitriptyline (ELAVIL) 10 MG tablet Patient taking differently: Take 10 mg by mouth 2 (two) times daily.   Marland Kitchen lactulose (CHRONULAC) 10 GM/15ML soln Take 30 mLs (20 g total) by mouth 2 (two) times daily.   Allergies  Allergen Reactions  .  Atorvastatin Other (See Comments)    Doesn't remember  . Diphenhydramine Hcl (Sleep) Hives  . Gemfibrozil Other (See Comments)    Doesn't remember  . Hydrocodone-Acetaminophen Other (See Comments)    Doesn't remember  . Loratadine Hives  . Lorazepam Hives  . Simvastatin Other (See Comments)  . Sulfamethoxazole Hives  . Sulfonamide Derivatives Hives   Past Medical History  Diagnosis Date  . Hx of shoulder replacement   . Hypertension   . Hyperlipidemia   . COPD (chronic obstructive pulmonary disease) ([place])   . Vitamin D deficiency   . IBS (irritable bowel syndrome)   . Fibromyalgia   . Hepatic  cirrhosis ([place])    Review of Systems  10 point systems review negative except as above.    Objective:   Physical Exam  BP 110/64 mmHg  Pulse 80  Temp(Src) 97.5 F (36.4 C)  Resp 16  In No Distress in W/C on 2 Lit O2.   HEENT - Eac's patent. TM's Nl. EOM's full. PERRLA. NasoOroPharynx clear. Neck - supple. Nl Thyroid. Carotids 2+ & No bruits, nodes, JVD Chest - BS decreased w/o RRW. No respiratory distress.  Cor - Nl HS. RRR w/o sig MGR. PP obscured by 2-3 + pretibial edema to the prox shins bialy. No calf tenderness.  Abd - Rotund & distended vs "Doughy". Soft w/o tenderness. BS active.  MS- FROM w/o deformities.  Neuro - No obvious Cr N abnormalities. Sensory, motor and Cerebellar functions appear Nl w/o focal abnormalities. No asterixis.  Psyche - Mental status dullede.  No psychosis. Skin - there is erythematous swelling or lymphedema of the LUE  From the hand to the  shoulder w/tenderness to touch. No discrete rash, ulcerations, or scabbing or purulent  lesions.      Assessment & Plan:   1. Liver cirrhosis secondary to NASH  - Ambulatory referral to Gastroenterology - Ambulatory referral to Gastroenterology to re-establish since Dr Kelby Fam Departure.  2. Hepatic encephalopathy (HCC)  - Ammonia - lactulose (CHRONULAC) 10 GM/15ML solution; Take 30 ml (1 ounce) 2 x day  Dispense: 1892 mL; Refill: 99 - Ambulatory referral to Gastroenterology to re-establish since Dr Kelby Fam Departure.   3. Edema, unspecified type   4. Medication management  - CBC with Differential/Platelet - BASIC METABOLIC PANEL WITH GFR  5. Cellulitis of left upper extremity  - Rx Doxycycline 100 mg - Take 2 caps now then 1 Cap bid pc. - Discussed meds/SE's.  - ROV 1 week

## 2015-10-06 ENCOUNTER — Other Ambulatory Visit: Payer: Self-pay

## 2015-10-06 LAB — AMMONIA: Ammonia: 76 umol/L — ABNORMAL HIGH (ref 16–53)

## 2015-10-06 MED ORDER — ONDANSETRON HCL 4 MG PO TABS: 4.0000 mg | ORAL_TABLET | Freq: Every day | ORAL | Status: AC | PRN

## 2015-10-06 NOTE — Patient Outreach (Signed)
[facility]) Care Management  10/06/2015  [name] [birth year] [id number]   Initial home visit for Heart Failure and COPD Education. Patient consented to her husband, [name] being present as he is her primary care giver.  Patient, husband and RNCM reviewed patient's diet and both denies using sodium. Patient does report leading a  Very sedentary lifestyle, spending most of the day watching TV.  All present viewed EMMI video on Heart Failure and COPD. Patient presented weights recorded since being discharged from acute care.  There is a recorded steady decrease in weights.  Daily Weight:  2/22  187 2/23  183 2/24 183 2/25 185 2/27 183 2/28 183 3/01 181 3/02 179   Plan: telephone contact next week for continued chronic disease education.

## 2015-10-12 ENCOUNTER — Ambulatory Visit (INDEPENDENT_AMBULATORY_CARE_PROVIDER_SITE_OTHER): Payer: PPO | Admitting: Internal Medicine

## 2015-10-12 ENCOUNTER — Encounter: Payer: Self-pay | Admitting: Internal Medicine

## 2015-10-12 ENCOUNTER — Other Ambulatory Visit: Payer: Self-pay

## 2015-10-12 VITALS — BP 110/74 | HR 76 | Temp 97.5°F | Resp 18 | Ht 62.5 in | Wt 180.0 lb

## 2015-10-12 DIAGNOSIS — J449 Chronic obstructive pulmonary disease, unspecified: Secondary | ICD-10-CM | POA: Diagnosis not present

## 2015-10-12 DIAGNOSIS — K7460 Unspecified cirrhosis of liver: Secondary | ICD-10-CM

## 2015-10-12 DIAGNOSIS — I890 Lymphedema, not elsewhere classified: Secondary | ICD-10-CM

## 2015-10-12 DIAGNOSIS — Z79899 Other long term (current) drug therapy: Secondary | ICD-10-CM

## 2015-10-12 DIAGNOSIS — K7581 Nonalcoholic steatohepatitis (NASH): Secondary | ICD-10-CM | POA: Diagnosis not present

## 2015-10-12 LAB — AMMONIA: Ammonia: 63 umol/L — ABNORMAL HIGH (ref 16–53)

## 2015-10-12 NOTE — Progress Notes (Signed)
(  OV from 10/05/2014) This unfortunate 70 yo MWF with multiple endstage dz's including O2 dependent COPD, NASH w/ Cirrhosis/Ascites/Esophag. Varices, and also has  HTN, HLD, PreDM w/Insulin Resistance, GERD, Vit D deficiency has had several recent hospitalizations for sepsis, dehydration and has also been closely followed for management of her fragile fluid balance status with venous insufficiency and ascites compromised by gentle diuresis with low dose Zaroxolyn facilitating her Spironolsctone and Furosemide  Subjective:    Patient ID: [name], female    DOB: 1946/02/15, 70 y.o.   MRN: [id number]  HPI Patient returns for f/u to evaluate possible cellulitis of LUE. Doughy lymphedema type swelling of bilat UE persists w/ L>>R. Pt's weight has been stable since last OV.     Review of Systems   10 point systems review negative except as above.    Objective:   Physical Exam  BP 110/74 mmHg  Pulse 76  Temp(Src) 97.5 F (36.4 C)  Resp 18  Ht 5' 2.5" (1.588 m)  Wt 180 lb (81.647 kg)  BMI 32.38 kg/m2  In No Distress in W/C on 2 Lit O2.   HEENT - Eac's patent. TM's Nl. EOM's full. PERRLA. NasoOroPharynx clear. Neck - supple. Nl Thyroid. Carotids 2+ & No bruits, nodes, JVD Chest - BS decreased w/o RRW. No respiratory distress.  Cor - Nl HS. RRR w/o sig MGR. PP obscured by 2-3 + pretibial edema to the prox shins bialy. No calf tenderness.  Abd - Rotund & Soft w/o tenderness. BS active.  MS- FROM w/o deformities. Noted Lymphedema of L>>RUE. Erythema of LUE w/o calor or signs of cellulitis/lymphangitis. In W/C.  Neuro - No obvious Cr N abnormalities. Sensory, motor and Cerebellar functions appear Nl w/o focal abnormalities. No asterixis.  Psyche - Mental status sad & tearful discussing End of Life Issues.   Skin  No discrete rash, ulcerations, or scabbing or purulent  lesions.      Assessment & Plan:  / 1. Liver cirrhosis secondary to NASH  - Ammonia - Protime-INR  2.  Lymphedema   3. Medication management  - Palliative Care Consult as patient & spouse are not willing to accept Hospice at this time.  - CBC with Differential/Platelet - BASIC METABOLIC PANEL WITH GFR - Hepatic function panel  4. COPD, very severe ([place])  -Over 30 minutes of exam, counseling, chart review and high complex critical decision making was performed.  - Both PA's - Wilburn Mylar & myself had a frank, but reassuring interchange with patient and husband wrt the severity of her severe COPD & Liver Disease abnd they are agreeable to a Palliative care consult and wife requests counseling.

## 2015-10-12 NOTE — Patient Instructions (Addendum)
Cirrhosis   Cirrhosis is long-term (chronic) liver injury. The liver is your largest internal organ, and it performs many functions. The liver converts food into energy, removes toxic material from your blood, makes important proteins, and absorbs necessary vitamins from your diet. If you have cirrhosis, it means many of your healthy liver cells have been replaced by scar tissue. This prevents blood from flowing through your liver, which makes it difficult for your liver to function. This scarring is not reversible, but treatment can prevent it from getting worse.   CAUSES  . Other causes include:  Nonalcoholic fatty liver disease.  Autoimmune hepatitis.  Diseases that cause blockage of ducts inside the liver.  Inherited liver diseases.  Reactions to certain long-term medicines.  Parasitic infections.  Long-term exposure to certain toxins.   SYMPTOMS  You may not have any signs and symptoms at first. Symptoms may not develop until the damage to your liver starts to get worse. Signs and symptoms of cirrhosis may include:   Tenderness in the right-upper part of your abdomen.  Weakness and tiredness (fatigue).  Loss of appetite.  Nausea.  Weight loss and muscle loss.  Itchiness.  Yellow skin and eyes (jaundice).  Buildup of fluid in the abdomen (ascites).  Swelling of the feet and ankles (edema).  Appearance of tiny blood vessels under the skin.  Mental confusion.  Easy bruising and bleeding. DIAGNOSIS  Your health care provider may suspect cirrhosis based on your symptoms and medical history, especially if you have other medical conditions or a history of alcohol abuse. Your health care provider will do a physical exam to feel your liver and check for signs of cirrhosis. Your health care provider may perform other tests, including:   Blood tests to check:    Kidney function.  Liver function.  TREATMENT  Treatment depends on how damaged your liver is and  what caused the damage. Treatment may include treating cirrhosis symptoms or treating the underlying causes of the condition to try to slow the progression of the damage. Treatment may include:  Making lifestyle changes, such as:   Eating a healthy diet.  Restricting salt intake.  Maintaining a healthy weight.   Not abusing drugs or alcohol.  Taking medicines to:  Treat liver infections or other infections.  Control itching.  Reduce fluid buildup.  Reduce certain blood toxins.  Reduce risk of bleeding from enlarged blood vessels in the stomach or esophagus (varices).  If varices are causing bleeding problems, you may need treatment with a procedure that ties up the vessels causing them to fall off (band ligation).  If cirrhosis is causing your liver to fail, your health care provider may recommend a liver transplant.  Other treatments may be recommended depending on any complications of cirrhosis, such as liver-related kidney failure (hepatorenal syndrome).   HOME CARE INSTRUCTIONS   Take medicines only as directed by your health care provider. Do not use drugs that are toxic to your liver. Ask your health care provider before taking any new medicines, including over-the-counter medicines.   Rest as needed.  Eat a well-balanced diet. Ask your health care provider or dietitian for more information.   You may have to follow a low-salt diet or restrict your water intake as directed.  Do not drink alcohol. This is especially important if you are taking acetaminophen.  Keep all follow-up visits as directed by your health care provider. This is important. SEEK MEDICAL CARE IF:  You have fatigue or weakness that is  getting worse.  You develop swelling of the hands, feet, legs, or face.  You have a fever.  You develop loss of appetite.  You have nausea or vomiting.  You develop jaundice.  You develop easy bruising or bleeding. SEEK IMMEDIATE MEDICAL CARE  IF:  You vomit bright red blood or a material that looks like coffee grounds.  You have blood in your stools.  Your stools appear black and tarry.  You become confused.  You have chest pain or trouble breathing.    ++++++++++++++++++++++++++++++++++++++++++++++++ Cirrhosis   Cirrhosis is a long-term liver disease. Your liver is important for functions such as absorbing nutrients from food, removing waste from the body, and making proteins and substances that help your blood clot. Cirrhosis destroys the tubelike structures (bile ducts) inside your liver that produce the digestive fluid called bile. Bile is necessary for absorbing fats, cholesterol, and fat-soluble vitamins. As bile ducts are destroyed, bile backs up in your liver and causes liver damage. It can lead to scarring of the liver (cirrhosis). CAUSES  The exact cause of primary biliary cirrhosis is not known. It may be an autoimmune disease. If you have an autoimmune disease, your body's defense system (immune system) mistakenly attacks normal cells. In primary biliary cirrhosis, the immune system attacks the bile ducts. RISK FACTORS You may be at risk for primary biliary cirrhosis if you:  Have a family history of the disease. If you have inherited the genes for the disease, it is likely that something must trigger the genes to become active. Possible triggers include:  Smoking.  Exposure to toxic chemicals.  Infection.  Are a woman.  Are 28-53 years old. SIGNS AND SYMPTOMS  In the early stages of the disease, you might not show any signs or symptoms. The earliest symptoms are:   Fatigue.  Very itchy skin.  Dry eyes or mouth.  Abdominal discomfort under your right rib.  Slight yellow appearance in the whites of your eyes or in your skin (jaundice). Later signs and symptoms develop as problems related to liver damage occur. These can include:  Swelling of the feet and legs (edema).  Swelling of the belly  (ascites).  Mental confusion and impaired mental functioning (hepatic encephalopathy).  Darkening and increased yellowing of the skin.  Fatty bowel movements.  Worsening right-sided belly pain.  Vomiting blood. DIAGNOSIS  Your health care provider may suspect primary biliary cirrhosis if you have abnormal results in a blood test for liver function. This test is often done as part of a routine physical exam. Your health care provider may also check to see if:  Your liver is enlarged.  Your spleen is enlarged.  You have changes in skin color.  You have swelling in your legs or belly.  . TREATMENT  You should avoid:  Alcohol.  Hepatotoxic medicines. These are medicines that are damaging to the liver when taken too often or in excessive amounts. These can include:  Medicines.  Herbal treatments.  Vitamins.  Diet supplements. As  cirrhosis progresses, you may need other treatments, such as:  Antihistamines to relieve itching.  Artificial tears and saliva to relieve dry eyes and mouth.  Medicines that lower blood pressure and reduce swelling (diuretics).  A laxative (lactulose) to eliminate toxic substances that cause hepatic encephalopathy.  A liver transplant if primary biliary cirrhosis develops into cirrhosis. HOME CARE INSTRUCTIONS  Learn as much as you can about your disease, and work closely with your health care providers.  Take medicines only  as directed by your health care provider.  Work with a dietitian to come up with a meal plan that gives you the right amount of protein and nutrition with less salt.  Drink enough fluid to keep your urine clear or pale yellow.  Take a daily walk. Ask your health care provider what the right amount of exercise is for you.  Do not use any tobacco products, including cigarettes, chewing tobacco, or electronic cigarettes. If you need help quitting, ask your health care provider.  Do not drink alcohol.  Do not eat  raw shellfish. SEEK MEDICAL CARE IF: Your symptoms are gradually getting worse.  SEEK IMMEDIATE MEDICAL CARE IF:  You vomit blood.  Your skin itching suddenly worsens or stops.  You develop new jaundice.  You develop confusion or pass out.  You develop sudden new swelling in your legs or belly.    Lymphedema Lymphedema is swelling that is caused by the abnormal collection of lymph under the skin. Lymph is fluid from the tissues in your body that travels in the lymphatic system. This system is part of the immune system and includes lymph nodes and lymph vessels. The lymph vessels collect and carry the excess fluid, fats, proteins, and wastes from the tissues of the body to the bloodstream. This system also works to clean and remove bacteria and waste products from the body. Lymphedema occurs when the lymphatic system is blocked. When the lymph vessels or lymph nodes are blocked or damaged, lymph does not drain properly, causing an abnormal buildup of lymph. This leads to swelling in the arms or legs. Lymphedema cannot be cured by medicines, but various methods can be used to help reduce the swelling. CAUSES  There are two types of lymphedema. Primary lymphedema is caused by the absence or abnormality of the lymph vessel at birth. Secondary lymphedema is more common. It occurs when the lymph vessel is damaged or blocked. Common causes of lymph vessel blockage include:   Formation of scar tissue.   SYMPTOMS Symptoms of this condition include:  Swelling of the arm or leg.  A heavy or tight feeling in the arm or leg.  Swelling of the feet, toes, or fingers. Shoes or rings may fit more tightly than before.  Redness of the skin over the affected area.  Limited movement of the affected limb.  Sensitivity to touch or discomfort in the affected limb. DIAGNOSIS This condition may be diagnosed with:  A physical exam.  Medical history.  TREATMENT Treatment for this condition may  depend on the cause. Treatment may include:  Exercise. Certain exercises can help fluid move out of the affected limb.  Massage. Gentle massage of the affected limb can help move the fluid out of the area.  Compression. Various methods may be used to apply pressure to the affected limb in order to reduce the swelling.  Wearing compression stockings or sleeves on the affected limb.  Bandaging the affected limb.  Using an external pump that is attached to a sleeve that alternates between applying pressure and releasing pressure.  Surgery. This is usually only done for severe cases. For example, surgery may be done if you have trouble moving the limb or if the swelling does not get better with other treatments. If an underlying condition is causing the lymphedema, treatment for that condition is needed. For example, antibiotic medicines may be used to treat an infection. HOME CARE INSTRUCTIONS Activities  Exercise regularly as directed by your health care provider.  Do  not sit with your legs crossed.  When possible, keep the affected limb raised (elevated) above the level of your heart.  Avoid carrying things with an arm that is affected by lymphedema.  Remember that the affected area is more likely to become injured or infected.  Take these steps to help prevent infection:  Keep the affected area clean and dry.  Protect your skin from cuts. For example, you should use gloves while cooking or gardening. Do not walk barefoot. If you shave the affected area, use an Copy. General Instructions  Take medicines only as directed by your health care provider.  Eat a healthy diet that includes a lot of fruits and vegetables.  Do not wear tight clothes, shoes, or jewelry.  Do not use heating pads over the affected area.  Avoid having blood pressure checked on the affected limb.  Keep all follow-up visits as directed by your health care provider. This is important. SEEK  MEDICAL CARE IF:  You continue to have swelling in your limb.  You have a fever.  You have a cut that does not heal.  You have redness or pain in the affected area.  You have new swelling in your limb that comes on suddenly.  You develop purplish spots or sores (lesions) on your limb. SEEK IMMEDIATE MEDICAL CARE IF:  You have a skin rash.  You have chills or sweats.  You have shortness of breath.

## 2015-10-12 NOTE — Patient Outreach (Signed)
This RNCM was successful in making contact with patient and husband via telephone. Patient deferred to husband as she stated she felt sleepy because she had just got back from 28 office after identifying herself by providing date of birth and address.    Husband stated their visit was to her primary  care physician.  Stated her primary care physician informed them her Hepatic status would not get better, the generalized swelling she was experiencing was due to the irreversible condition of the liver.    Husband stated Dr. Melford Aase had ordered  Palliative Care to assist her for end of life planning, comfort care.    Husband stated he and patient had expected this and he was okay right now.  He stated further his wife was okay, just tired.  Plan: Telephone contact next week for follow up with Palliative Care Consult

## 2015-10-13 LAB — CBC WITH DIFFERENTIAL/PLATELET
Basophils Absolute: 0.1 10*3/uL (ref 0.0–0.1)
Basophils Relative: 1 % (ref 0–1)
Eosinophils Absolute: 2.4 10*3/uL — ABNORMAL HIGH (ref 0.0–0.7)
Eosinophils Relative: 25 % — ABNORMAL HIGH (ref 0–5)
HEMATOCRIT: 33.2 % — AB (ref 36.0–46.0)
HEMOGLOBIN: 11.1 g/dL — AB (ref 12.0–15.0)
LYMPHS PCT: 19 % (ref 12–46)
Lymphs Abs: 1.8 10*3/uL (ref 0.7–4.0)
MCH: 33.7 pg (ref 26.0–34.0)
MCHC: 33.4 g/dL (ref 30.0–36.0)
MCV: 100.9 fL — ABNORMAL HIGH (ref 78.0–100.0)
MONO ABS: 0.7 10*3/uL (ref 0.1–1.0)
MPV: 11.2 fL (ref 8.6–12.4)
Monocytes Relative: 7 % (ref 3–12)
NEUTROS ABS: 4.6 10*3/uL (ref 1.7–7.7)
NEUTROS PCT: 48 % (ref 43–77)
Platelets: 124 10*3/uL — ABNORMAL LOW (ref 150–400)
RBC: 3.29 MIL/uL — AB (ref 3.87–5.11)
RDW: 16.4 % — ABNORMAL HIGH (ref 11.5–15.5)
WBC: 9.6 10*3/uL (ref 4.0–10.5)

## 2015-10-13 LAB — HEPATIC FUNCTION PANEL
ALK PHOS: 111 U/L (ref 33–130)
ALT: 17 U/L (ref 6–29)
AST: 30 U/L (ref 10–35)
Albumin: 3.7 g/dL (ref 3.6–5.1)
BILIRUBIN DIRECT: 0.2 mg/dL (ref <=0.2)
BILIRUBIN INDIRECT: 0.6 mg/dL (ref 0.2–1.2)
TOTAL PROTEIN: 5.8 g/dL — AB (ref 6.1–8.1)
Total Bilirubin: 0.8 mg/dL (ref 0.2–1.2)

## 2015-10-13 LAB — BASIC METABOLIC PANEL WITH GFR
BUN: 27 mg/dL — ABNORMAL HIGH (ref 7–25)
CHLORIDE: 92 mmol/L — AB (ref 98–110)
CO2: 37 mmol/L — AB (ref 20–31)
Calcium: 9.6 mg/dL (ref 8.6–10.4)
Creat: 1.43 mg/dL — ABNORMAL HIGH (ref 0.50–0.99)
GFR, EST NON AFRICAN AMERICAN: 38 mL/min — AB (ref >=60)
GFR, Est African American: 43 mL/min — ABNORMAL LOW (ref >=60)
GLUCOSE: 120 mg/dL — AB (ref 65–99)
POTASSIUM: 4.1 mmol/L (ref 3.5–5.3)
Sodium: 137 mmol/L (ref 135–146)

## 2015-10-13 LAB — PROTIME-INR
INR: 1.1 (ref <1.50)
PROTHROMBIN TIME: 14.3 s (ref 11.6–15.2)

## 2015-10-13 LAB — AMMONIA

## 2015-10-17 DIAGNOSIS — R531 Weakness: Secondary | ICD-10-CM | POA: Diagnosis not present

## 2015-10-17 DIAGNOSIS — M7542 Impingement syndrome of left shoulder: Secondary | ICD-10-CM | POA: Diagnosis not present

## 2015-10-18 DIAGNOSIS — J449 Chronic obstructive pulmonary disease, unspecified: Secondary | ICD-10-CM | POA: Diagnosis not present

## 2015-10-19 ENCOUNTER — Other Ambulatory Visit: Payer: Self-pay

## 2015-10-19 ENCOUNTER — Other Ambulatory Visit: Payer: Self-pay | Admitting: Physician Assistant

## 2015-10-19 MED ORDER — ALPRAZOLAM 0.5 MG PO TABS: ORAL_TABLET | ORAL | Status: DC

## 2015-10-19 NOTE — Progress Notes (Signed)
RX CALLED INTO [facility]

## 2015-10-19 NOTE — Patient Outreach (Signed)
Discharge call made to patient and husband to discuss decision related to Palliative Care. This RNCM spoke with patient's husband who confirms having Palliative Care Consult and acceptance of benefits.  Husband states he understands that things are not going to get any better, will need the end of life support he expects will be needed for wife.  Plan: Discharge from caseload due to patient and husband electing Palliative Care benefits.

## 2015-10-22 ENCOUNTER — Other Ambulatory Visit: Payer: Self-pay | Admitting: Internal Medicine

## 2015-10-28 ENCOUNTER — Telehealth: Payer: Self-pay | Admitting: *Deleted

## 2015-10-28 DIAGNOSIS — R531 Weakness: Secondary | ICD-10-CM | POA: Diagnosis not present

## 2015-10-28 NOTE — Telephone Encounter (Signed)
Wynell Balloon, NP with [facility] called with update on her consult with patient today.  She states she was told that patient had an episode yesterday, 10/27/15 of shaking and irritability.  Rodena Piety was told that patient's husband gave her an Ativan and it helped with her symptoms.  Rodena Piety reports patient is very lethargic and tired today and is concerned her Ammonia level is elevated. Spoke with Starlyn Skeans, PA-C and advised Rodena Piety to increase Lactulose to TID and we will bring patient into office Monday, 10/31/15 for office visit to check Ammonia level.  Advised her if any change in symptoms she will need to got to ER for further E/T over the weekend.

## 2015-10-29 ENCOUNTER — Inpatient Hospital Stay (HOSPITAL_COMMUNITY)
Admission: EM | Admit: 2015-10-29 | Discharge: 2015-10-31 | DRG: 442 | Disposition: A | Discharge status: Home / Self Care | Payer: PPO | Attending: Internal Medicine | Admitting: Internal Medicine

## 2015-10-29 ENCOUNTER — Encounter (HOSPITAL_COMMUNITY): Payer: Self-pay

## 2015-10-29 DIAGNOSIS — Z833 Family history of diabetes mellitus: Secondary | ICD-10-CM | POA: Diagnosis not present

## 2015-10-29 DIAGNOSIS — Z87891 Personal history of nicotine dependence: Secondary | ICD-10-CM

## 2015-10-29 DIAGNOSIS — D649 Anemia, unspecified: Secondary | ICD-10-CM | POA: Diagnosis present

## 2015-10-29 DIAGNOSIS — Z79899 Other long term (current) drug therapy: Secondary | ICD-10-CM

## 2015-10-29 DIAGNOSIS — M797 Fibromyalgia: Secondary | ICD-10-CM | POA: Diagnosis present

## 2015-10-29 DIAGNOSIS — F329 Major depressive disorder, single episode, unspecified: Secondary | ICD-10-CM | POA: Diagnosis not present

## 2015-10-29 DIAGNOSIS — J449 Chronic obstructive pulmonary disease, unspecified: Secondary | ICD-10-CM | POA: Diagnosis not present

## 2015-10-29 DIAGNOSIS — K7682 Hepatic encephalopathy: Secondary | ICD-10-CM | POA: Diagnosis present

## 2015-10-29 DIAGNOSIS — I10 Essential (primary) hypertension: Secondary | ICD-10-CM | POA: Diagnosis present

## 2015-10-29 DIAGNOSIS — I5032 Chronic diastolic (congestive) heart failure: Secondary | ICD-10-CM | POA: Diagnosis not present

## 2015-10-29 DIAGNOSIS — Z7982 Long term (current) use of aspirin: Secondary | ICD-10-CM

## 2015-10-29 DIAGNOSIS — E559 Vitamin D deficiency, unspecified: Secondary | ICD-10-CM | POA: Diagnosis present

## 2015-10-29 DIAGNOSIS — Z888 Allergy status to other drugs, medicaments and biological substances status: Secondary | ICD-10-CM | POA: Diagnosis not present

## 2015-10-29 DIAGNOSIS — Z9981 Dependence on supplemental oxygen: Secondary | ICD-10-CM | POA: Diagnosis not present

## 2015-10-29 DIAGNOSIS — I129 Hypertensive chronic kidney disease with stage 1 through stage 4 chronic kidney disease, or unspecified chronic kidney disease: Secondary | ICD-10-CM | POA: Diagnosis not present

## 2015-10-29 DIAGNOSIS — Z96611 Presence of right artificial shoulder joint: Secondary | ICD-10-CM | POA: Diagnosis present

## 2015-10-29 DIAGNOSIS — E785 Hyperlipidemia, unspecified: Secondary | ICD-10-CM | POA: Diagnosis present

## 2015-10-29 DIAGNOSIS — Z96643 Presence of artificial hip joint, bilateral: Secondary | ICD-10-CM | POA: Diagnosis present

## 2015-10-29 DIAGNOSIS — F419 Anxiety disorder, unspecified: Secondary | ICD-10-CM | POA: Diagnosis not present

## 2015-10-29 DIAGNOSIS — D696 Thrombocytopenia, unspecified: Secondary | ICD-10-CM | POA: Diagnosis present

## 2015-10-29 DIAGNOSIS — K2100 Gastro-esophageal reflux disease with esophagitis, without bleeding: Secondary | ICD-10-CM | POA: Diagnosis present

## 2015-10-29 DIAGNOSIS — K589 Irritable bowel syndrome without diarrhea: Secondary | ICD-10-CM | POA: Diagnosis present

## 2015-10-29 DIAGNOSIS — K219 Gastro-esophageal reflux disease without esophagitis: Secondary | ICD-10-CM | POA: Diagnosis present

## 2015-10-29 DIAGNOSIS — N183 Chronic kidney disease, stage 3 (moderate): Secondary | ICD-10-CM | POA: Diagnosis not present

## 2015-10-29 DIAGNOSIS — K7581 Nonalcoholic steatohepatitis (NASH): Principal | ICD-10-CM | POA: Diagnosis present

## 2015-10-29 DIAGNOSIS — J418 Mixed simple and mucopurulent chronic bronchitis: Secondary | ICD-10-CM | POA: Diagnosis not present

## 2015-10-29 DIAGNOSIS — Z882 Allergy status to sulfonamides status: Secondary | ICD-10-CM | POA: Diagnosis not present

## 2015-10-29 DIAGNOSIS — Z8249 Family history of ischemic heart disease and other diseases of the circulatory system: Secondary | ICD-10-CM | POA: Diagnosis not present

## 2015-10-29 DIAGNOSIS — K7460 Unspecified cirrhosis of liver: Secondary | ICD-10-CM | POA: Diagnosis present

## 2015-10-29 DIAGNOSIS — Z885 Allergy status to narcotic agent status: Secondary | ICD-10-CM | POA: Diagnosis not present

## 2015-10-29 DIAGNOSIS — E1122 Type 2 diabetes mellitus with diabetic chronic kidney disease: Secondary | ICD-10-CM | POA: Diagnosis not present

## 2015-10-29 DIAGNOSIS — K7290 Hepatic failure, unspecified without coma: Secondary | ICD-10-CM | POA: Diagnosis present

## 2015-10-29 LAB — CBC
HCT: 37.8 % (ref 36.0–46.0)
Hemoglobin: 12.4 g/dL (ref 12.0–15.0)
MCH: 32.9 pg (ref 26.0–34.0)
MCHC: 32.8 g/dL (ref 30.0–36.0)
MCV: 100.3 fL — ABNORMAL HIGH (ref 78.0–100.0)
PLATELETS: 95 10*3/uL — AB (ref 150–400)
RBC: 3.77 MIL/uL — AB (ref 3.87–5.11)
RDW: 15.1 % (ref 11.5–15.5)
WBC: 9.8 10*3/uL (ref 4.0–10.5)

## 2015-10-29 LAB — COMPREHENSIVE METABOLIC PANEL
ALT: 37 U/L (ref 14–54)
AST: 44 U/L — ABNORMAL HIGH (ref 15–41)
Albumin: 3.9 g/dL (ref 3.5–5.0)
Alkaline Phosphatase: 100 U/L (ref 38–126)
Anion gap: 15 (ref 5–15)
BUN: 38 mg/dL — ABNORMAL HIGH (ref 6–20)
CHLORIDE: 90 mmol/L — AB (ref 101–111)
CO2: 31 mmol/L (ref 22–32)
CREATININE: 1.46 mg/dL — AB (ref 0.44–1.00)
Calcium: 9.7 mg/dL (ref 8.9–10.3)
GFR, EST AFRICAN AMERICAN: 41 mL/min — AB (ref >60)
GFR, EST NON AFRICAN AMERICAN: 36 mL/min — AB (ref >60)
Glucose, Bld: 177 mg/dL — ABNORMAL HIGH (ref 65–99)
Potassium: 3.7 mmol/L (ref 3.5–5.1)
Sodium: 136 mmol/L (ref 135–145)
TOTAL PROTEIN: 6.8 g/dL (ref 6.5–8.1)
Total Bilirubin: 0.9 mg/dL (ref 0.3–1.2)

## 2015-10-29 LAB — LIPASE, BLOOD: LIPASE: 71 U/L — AB (ref 11–51)

## 2015-10-29 LAB — AMMONIA: AMMONIA: 108 umol/L — AB (ref 9–35)

## 2015-10-29 MED ORDER — ONDANSETRON HCL 4 MG PO TABS
4.0000 mg | ORAL_TABLET | Freq: Every day | ORAL | Status: DC | PRN
  Administered 2015-10-30: 4 mg via ORAL
  Filled 2015-10-29: qty 1

## 2015-10-29 MED ORDER — METOLAZONE 2.5 MG PO TABS
2.5000 mg | ORAL_TABLET | ORAL | Status: DC
  Administered 2015-10-30: 2.5 mg via ORAL
  Filled 2015-10-29: qty 1

## 2015-10-29 MED ORDER — FUROSEMIDE 40 MG PO TABS
80.0000 mg | ORAL_TABLET | Freq: Two times a day (BID) | ORAL | Status: DC
  Administered 2015-10-30 – 2015-10-31 (×4): 80 mg via ORAL
  Filled 2015-10-29 (×4): qty 2

## 2015-10-29 MED ORDER — METFORMIN HCL 500 MG PO TABS
500.0000 mg | ORAL_TABLET | Freq: Two times a day (BID) | ORAL | Status: DC
  Administered 2015-10-30: 500 mg via ORAL
  Filled 2015-10-29 (×2): qty 1

## 2015-10-29 MED ORDER — PANTOPRAZOLE SODIUM 40 MG PO TBEC
40.0000 mg | DELAYED_RELEASE_TABLET | Freq: Two times a day (BID) | ORAL | Status: DC
  Administered 2015-10-30 – 2015-10-31 (×4): 40 mg via ORAL
  Filled 2015-10-29 (×5): qty 1

## 2015-10-29 MED ORDER — MAGNESIUM OXIDE 400 (241.3 MG) MG PO TABS
200.0000 mg | ORAL_TABLET | Freq: Every day | ORAL | Status: DC
  Administered 2015-10-30 – 2015-10-31 (×3): 200 mg via ORAL
  Filled 2015-10-29 (×3): qty 0.5

## 2015-10-29 MED ORDER — ASPIRIN 81 MG PO CHEW
81.0000 mg | CHEWABLE_TABLET | Freq: Every day | ORAL | Status: DC
  Administered 2015-10-30 – 2015-10-31 (×3): 81 mg via ORAL
  Filled 2015-10-29 (×3): qty 1

## 2015-10-29 MED ORDER — POTASSIUM CHLORIDE ER 10 MEQ PO TBCR
20.0000 meq | EXTENDED_RELEASE_TABLET | Freq: Every day | ORAL | Status: DC
  Administered 2015-10-30 – 2015-10-31 (×3): 20 meq via ORAL
  Filled 2015-10-29 (×4): qty 2

## 2015-10-29 MED ORDER — IPRATROPIUM BROMIDE 0.02 % IN SOLN
0.5000 mg | Freq: Four times a day (QID) | RESPIRATORY_TRACT | Status: DC | PRN
  Administered 2015-10-30: 0.5 mg via RESPIRATORY_TRACT
  Filled 2015-10-29: qty 2.5

## 2015-10-29 MED ORDER — ALLOPURINOL 300 MG PO TABS
300.0000 mg | ORAL_TABLET | Freq: Every day | ORAL | Status: DC
  Administered 2015-10-30 – 2015-10-31 (×3): 300 mg via ORAL
  Filled 2015-10-29 (×3): qty 1

## 2015-10-29 MED ORDER — LACTULOSE 10 GM/15ML PO SOLN
30.0000 g | Freq: Once | ORAL | Status: AC
  Administered 2015-10-29: 30 g via ORAL
  Filled 2015-10-29: qty 45

## 2015-10-29 MED ORDER — TAMOXIFEN CITRATE 10 MG PO TABS
20.0000 mg | ORAL_TABLET | Freq: Every day | ORAL | Status: DC
  Administered 2015-10-30 – 2015-10-31 (×3): 20 mg via ORAL
  Filled 2015-10-29 (×3): qty 2

## 2015-10-29 MED ORDER — BISACODYL 10 MG RE SUPP: 10.0000 mg | Freq: Every day | RECTAL | Status: DC | PRN

## 2015-10-29 MED ORDER — LACTULOSE 10 GM/15ML PO SOLN
30.0000 g | Freq: Two times a day (BID) | ORAL | Status: DC
  Administered 2015-10-30 – 2015-10-31 (×3): 30 g via ORAL
  Filled 2015-10-29 (×4): qty 45

## 2015-10-29 MED ORDER — SPIRONOLACTONE 25 MG PO TABS
25.0000 mg | ORAL_TABLET | Freq: Every day | ORAL | Status: DC
  Administered 2015-10-30 – 2015-10-31 (×3): 25 mg via ORAL
  Filled 2015-10-29 (×3): qty 1

## 2015-10-29 MED ORDER — CITALOPRAM HYDROBROMIDE 40 MG PO TABS
40.0000 mg | ORAL_TABLET | Freq: Two times a day (BID) | ORAL | Status: DC
  Administered 2015-10-30 – 2015-10-31 (×4): 40 mg via ORAL
  Filled 2015-10-29 (×4): qty 1

## 2015-10-29 MED ORDER — BISOPROLOL FUMARATE 10 MG PO TABS
10.0000 mg | ORAL_TABLET | Freq: Every day | ORAL | Status: DC
  Administered 2015-10-30 – 2015-10-31 (×3): 10 mg via ORAL
  Filled 2015-10-29 (×3): qty 1

## 2015-10-29 MED ORDER — METOLAZONE 2.5 MG PO TABS: 2.5000 mg | ORAL_TABLET | ORAL | Status: DC

## 2015-10-29 MED ORDER — BENZONATATE 100 MG PO CAPS: 100.0000 mg | ORAL_CAPSULE | Freq: Three times a day (TID) | ORAL | Status: DC | PRN

## 2015-10-29 NOTE — ED Notes (Signed)
Bed: WA25 Expected date:  Expected time:  Means of arrival:  Comments: 

## 2015-10-29 NOTE — ED Provider Notes (Signed)
CSN: [id number]     Arrival date & time 10/29/15  1825 History   First MD Initiated Contact with Patient 10/29/15 2018     Chief Complaint  Patient presents with  . Abdominal Pain  . Fatigue     (Consider location/radiation/quality/duration/timing/severity/associated sxs/prior Treatment) The history is provided by the spouse and the patient.  [name] is a 70 y.o. female hx of COPD, cirrhosis, hepatic encephalopathy here with confusion. Patient has a history of hepatic encephalopathy. Patient has been taking lactulose 30 g twice daily. Patient has been confused for the last 3 days as per husband. Patient also has been diffusely weak. Patient's husband tried to increase lactulose to 30 g three times daily since yesterday. Had large BM yesterday but none today. Denies vomiting. Was admitted recently for hepatic encephalopathy.   Level V caveat- confusion    Past Medical History  Diagnosis Date  . Hx of shoulder replacement   . Hypertension   . Hyperlipidemia   . COPD (chronic obstructive pulmonary disease) ([place])   . Vitamin D deficiency   . IBS (irritable bowel syndrome)   . Fibromyalgia   . Hepatic cirrhosis [facility])    Past Surgical History  Procedure Laterality Date  . Hip arthroplasty Bilateral   . Shoulder surgery Right   . Back surgery    . Cholecystectomy    . Neck surgery    . Esophagogastroduodenoscopy  07/16/2012    Procedure: ESOPHAGOGASTRODUODENOSCOPY (EGD);  Surgeon: Inda Castle, MD;  Location: [facility];  Service: Endoscopy;  Laterality: N/A;  . Gastric varices banding  07/16/2012    Procedure: GASTRIC VARICES BANDING;  Surgeon: Inda Castle, MD;  Location: [facility] END[facility]COPY;  Service: Endoscopy;  Laterality: N/A;   Family History  Problem Relation Age of Onset  . Colon cancer Neg Hx   . Diabetes Brother   . Heart disease Sister     A Fib  . Heart disease Mother     CHF   Social History  Substance Use Topics  . Smoking status: Former Smoker --  1.00 packs/day for 50 years    Types: Cigarettes    Quit date: 11/05/2014  . Smokeless tobacco: Never Used     Comment: Quit April 2016  . Alcohol Use: 0.0 oz/week    0 Standard drinks or equivalent per week     Comment: rarely   OB History    No data available     Review of Systems  Neurological: Positive for weakness.  All other systems reviewed and are negative.     Allergies  Atorvastatin; Diphenhydramine hcl (sleep); Gemfibrozil; Hydrocodone-acetaminophen; Loratadine; Lorazepam; Simvastatin; Sulfamethoxazole; and Sulfonamide derivatives  Home Medications   Prior to Admission medications   Medication Sig Start Date End Date Taking? Authorizing Provider  allopurinol (ZYLOPRIM) 300 MG tablet 1/2-1 tablet daily for gout Patient taking differently: Take 300 mg by mouth daily.  03/03/15 03/02/18  Unk Pinto, MD  ALPRAZolam Duanne Moron) 0.5 MG tablet 1/2-1 pil up to 3 times a day for anxiety 10/19/15   Vicie Mutters, PA-C  amitriptyline (ELAVIL) 10 MG tablet Take 1 tablet (10 mg total) by mouth 3 (three) times daily as needed. 10/05/15   Unk Pinto, MD  aspirin 81 MG tablet Take 81 mg by mouth daily.    Historical Provider, MD  bisoprolol (ZEBETA) 10 MG tablet Take 1 tablet (10 mg total) by mouth daily. 09/13/15   Unk Pinto, MD  Cholecalciferol 5000 units capsule Take 10,000 Units by mouth  daily.    Historical Provider, MD  citalopram (CELEXA) 20 MG tablet Take 1 tablet (20 mg total) by mouth daily. 09/24/15   Orson Eva, MD  cyclobenzaprine (FLEXERIL) 10 MG tablet Take 1 tablet (10 mg total) by mouth 3 (three) times daily as needed for muscle spasms. 11/17/14   Courtney Forcucci, PA-C  doxycycline (VIBRAMYCIN) 100 MG capsule Take 2 capsules at once on a full stomach, then take 1 capsule after supper today ,  then 1 capsule   2 x day on a full stomach for infection 10/05/15 11/05/15  Unk Pinto, MD  ferrous sulfate 325 (65 FE) MG tablet TAKE 1 TABLET TWICE DAILY Patient taking  differently: TAKE 1 TABLET ONCE DAILY 01/19/13   Inda Castle, MD  furosemide (LASIX) 40 MG tablet Take 1-2 tablets (40-80 mg total) by mouth 2 (two) times daily. 80 mg in am and 40 mg in pm Patient taking differently: Take 80 mg by mouth 2 (two) times daily.  08/09/15   Costin Karlyne Greenspan, MD  gabapentin (NEURONTIN) 300 MG capsule take 1 capsule by mouth in the morning, 1 capsule in the afternoon and 1-3 capsules at night 09/27/15   Vicie Mutters, PA-C  guaiFENesin (ROBITUSSIN) 100 MG/5ML liquid Take 5-10 mLs (100-200 mg total) by mouth every 4 (four) hours as needed for cough. 07/26/15   Shary Decamp, PA-C  ipratropium (ATROVENT) 0.02 % nebulizer solution use 1 vial in nebulizer EVERY 4 HOURS AS NEEDED FOR WHEEZING OR SHORTNESS OF BREATH 08/24/15   Unk Pinto, MD  lactulose [facility]) 10 GM/15ML solution Take 30 ml (1 ounce) 2 x day 10/05/15 10/04/16  Unk Pinto, MD  Magnesium 250 MG TABS Take 250 mg by mouth daily at 6 (six) AM.    Historical Provider, MD  Omega-3 Fatty Acids (FISH OIL) 1000 MG CAPS Take 1,000 mg by mouth 2 (two) times daily. Reported on 10/06/2015    Historical Provider, MD  ondansetron (ZOFRAN) 4 MG tablet Take 1 tablet (4 mg total) by mouth daily as needed for nausea or vomiting. 10/06/15 10/05/16  Vicie Mutters, PA-C  OVER THE COUNTER MEDICATION Take 1-2 tablets by mouth as needed (constipation). Natural Veg Laxative    Historical Provider, MD  OXYGEN Inhale 3 L/min into the lungs daily as needed (On exertion and at home).     Historical Provider, MD  pantoprazole (PROTONIX) 40 MG tablet Take 1 tablet (40 mg total) by mouth 2 (two) times daily. 09/05/15   Vicie Mutters, PA-C  potassium chloride (K-DUR) 10 MEQ tablet Take 2 tablets (20 mEq total) by mouth daily. 10/23/15   Vicie Mutters, PA-C  spironolactone (ALDACTONE) 25 MG tablet TAKE 1 TABLET BY MOUTH THREE TIMES DAILY FOR FLUIDS AND SWELLING Patient taking differently: TAKE 1 TABLET BY MOUTH TWICE A DAY 09/05/15   Vicie Mutters, PA-C  tamoxifen (NOLVADEX) 20 MG tablet Take 1 tablet (20 mg total) by mouth daily. 04/26/15   Courtney Forcucci, PA-C  traMADol (ULTRAM) 50 MG tablet Take 1 tablet (50 mg total) by mouth every 6 (six) hours as needed. 09/27/15 09/26/16  Courtney Forcucci, PA-C   BP 133/70 mmHg  Pulse 72  Temp(Src) 98.3 F (36.8 C) (Oral)  Resp 18  SpO2 100% Physical Exam  Constitutional: She appears well-developed.  Confused   HENT:  Head: Normocephalic.  Mouth/Throat: Oropharynx is clear and moist.  Eyes: Conjunctivae are normal. Pupils are equal, round, and reactive to light.  Neck: Normal range of motion. Neck supple.  Cardiovascular: Normal  rate, regular rhythm and normal heart sounds.   Pulmonary/Chest: Effort normal and breath sounds normal. No respiratory distress. She has no wheezes. She has no rales.  Abdominal: Soft. Bowel sounds are normal.  + cirrhosis, + fluid wave. Nontender   Musculoskeletal: Normal range of motion. She exhibits no edema or tenderness.  Neurological: She is alert.  Confused. No obvious asterixis.   Skin: Skin is warm and dry.  Psychiatric:  Confused   Nursing note and vitals reviewed.   ED Course  Procedures (including critical care time)  CRITICAL CARE Performed by: Darl Householder, Aamina Skiff   Total critical care time: 30 minutes  Critical care time was exclusive of separately billable procedures and treating other patients.  Critical care was necessary to treat or prevent imminent or life-threatening deterioration.  Critical care was time spent personally by me on the following activities: development of treatment plan with patient and/or surrogate as well as nursing, discussions with consultants, evaluation of patient's response to treatment, examination of patient, obtaining history from patient or surrogate, ordering and performing treatments and interventions, ordering and review of laboratory studies, ordering and review of radiographic studies, pulse  oximetry and re-evaluation of patient's condition.   Labs Review Labs Reviewed  LIPASE, BLOOD - Abnormal; Notable for the following:    Lipase 71 (*)    All other components within normal limits  COMPREHENSIVE METABOLIC PANEL - Abnormal; Notable for the following:    Chloride 90 (*)    Glucose, Bld 177 (*)    BUN 38 (*)    Creatinine, Ser 1.46 (*)    AST 44 (*)    GFR calc non Af Amer 36 (*)    GFR calc Af Amer 41 (*)    All other components within normal limits  CBC - Abnormal; Notable for the following:    RBC 3.77 (*)    MCV 100.3 (*)    Platelets 95 (*)    All other components within normal limits  AMMONIA - Abnormal; Notable for the following:    Ammonia 108 (*)    All other components within normal limits  URINALYSIS, ROUTINE W REFLEX MICROSCOPIC (NOT AT [facility])    Imaging Review No results found. I have personally reviewed and evaluated these images and lab results as part of my medical decision-making.   EKG Interpretation None      MDM   Final diagnoses:  None   [name] is a 70 y.o. female here with confusion. Likely hepatic encephalopathy. There is fluid wave but nontender abdomen. Will get labs, LFTs, ammonia level.   9:02 PM Ammonia level 108. Given lactulose PO. Will admit to tele.  Wandra Arthurs, MD 10/29/15 [phone]

## 2015-10-29 NOTE — ED Notes (Signed)
Pt here last month for same.  Has hx of cirrhosis of liver.  Gets elevated ammonia levels.  Because weak and confused at times.  Symptoms started on Thursday.  Pt taking her lactulose.  Pt had one stool yesterday and none today.  Abdominal pain.

## 2015-10-29 NOTE — ED Notes (Signed)
Pt taken to restroom with no success for UA.

## 2015-10-30 DIAGNOSIS — E1122 Type 2 diabetes mellitus with diabetic chronic kidney disease: Secondary | ICD-10-CM | POA: Diagnosis present

## 2015-10-30 DIAGNOSIS — K7290 Hepatic failure, unspecified without coma: Secondary | ICD-10-CM

## 2015-10-30 DIAGNOSIS — N183 Chronic kidney disease, stage 3 (moderate): Secondary | ICD-10-CM

## 2015-10-30 LAB — AMMONIA: AMMONIA: 48 umol/L — AB (ref 9–35)

## 2015-10-30 LAB — COMPREHENSIVE METABOLIC PANEL
ALBUMIN: 3.8 g/dL (ref 3.5–5.0)
ALT: 38 U/L (ref 14–54)
AST: 41 U/L (ref 15–41)
Alkaline Phosphatase: 94 U/L (ref 38–126)
Anion gap: 15 (ref 5–15)
BUN: 37 mg/dL — AB (ref 6–20)
CALCIUM: 9.7 mg/dL (ref 8.9–10.3)
CO2: 31 mmol/L (ref 22–32)
CREATININE: 1.47 mg/dL — AB (ref 0.44–1.00)
Chloride: 92 mmol/L — ABNORMAL LOW (ref 101–111)
GFR calc Af Amer: 41 mL/min — ABNORMAL LOW (ref >60)
GFR calc non Af Amer: 35 mL/min — ABNORMAL LOW (ref >60)
GLUCOSE: 157 mg/dL — AB (ref 65–99)
POTASSIUM: 3.5 mmol/L (ref 3.5–5.1)
SODIUM: 138 mmol/L (ref 135–145)
TOTAL PROTEIN: 6.6 g/dL (ref 6.5–8.1)
Total Bilirubin: 1.1 mg/dL (ref 0.3–1.2)

## 2015-10-30 LAB — GLUCOSE, CAPILLARY
GLUCOSE-CAPILLARY: 158 mg/dL — AB (ref 65–99)
Glucose-Capillary: 110 mg/dL — ABNORMAL HIGH (ref 65–99)
Glucose-Capillary: 128 mg/dL — ABNORMAL HIGH (ref 65–99)
Glucose-Capillary: 152 mg/dL — ABNORMAL HIGH (ref 65–99)

## 2015-10-30 LAB — URINALYSIS, ROUTINE W REFLEX MICROSCOPIC
BILIRUBIN URINE: NEGATIVE
Glucose, UA: NEGATIVE mg/dL
Ketones, ur: NEGATIVE mg/dL
NITRITE: NEGATIVE
PH: 6.5 (ref 5.0–8.0)
Protein, ur: >300 mg/dL — AB
SPECIFIC GRAVITY, URINE: 1.012 (ref 1.005–1.030)

## 2015-10-30 LAB — CBC WITH DIFFERENTIAL/PLATELET
BASOS PCT: 0 %
Basophils Absolute: 0 10*3/uL (ref 0.0–0.1)
EOS ABS: 0.2 10*3/uL (ref 0.0–0.7)
EOS PCT: 2 %
HCT: 37.6 % (ref 36.0–46.0)
Hemoglobin: 12.1 g/dL (ref 12.0–15.0)
LYMPHS ABS: 1.3 10*3/uL (ref 0.7–4.0)
Lymphocytes Relative: 16 %
MCH: 32.9 pg (ref 26.0–34.0)
MCHC: 32.2 g/dL (ref 30.0–36.0)
MCV: 102.2 fL — ABNORMAL HIGH (ref 78.0–100.0)
Monocytes Absolute: 0.6 10*3/uL (ref 0.1–1.0)
Monocytes Relative: 7 %
Neutro Abs: 6.1 10*3/uL (ref 1.7–7.7)
Neutrophils Relative %: 75 %
PLATELETS: 100 10*3/uL — AB (ref 150–400)
RBC: 3.68 MIL/uL — AB (ref 3.87–5.11)
RDW: 15.2 % (ref 11.5–15.5)
WBC: 8.2 10*3/uL (ref 4.0–10.5)

## 2015-10-30 LAB — URINE MICROSCOPIC-ADD ON

## 2015-10-30 MED ORDER — INSULIN ASPART 100 UNIT/ML ~~LOC~~ SOLN
0.0000 [IU] | Freq: Three times a day (TID) | SUBCUTANEOUS | Status: DC
  Administered 2015-10-30: 2 [IU] via SUBCUTANEOUS
  Administered 2015-10-30: 1 [IU] via SUBCUTANEOUS

## 2015-10-30 MED ORDER — ALPRAZOLAM 0.5 MG PO TABS
0.5000 mg | ORAL_TABLET | Freq: Three times a day (TID) | ORAL | Status: DC | PRN
  Administered 2015-10-30 – 2015-10-31 (×2): 0.5 mg via ORAL
  Filled 2015-10-30 (×3): qty 1

## 2015-10-30 MED ORDER — TRAMADOL HCL 50 MG PO TABS
50.0000 mg | ORAL_TABLET | Freq: Four times a day (QID) | ORAL | Status: DC | PRN
  Administered 2015-10-30: 50 mg via ORAL
  Filled 2015-10-30: qty 1

## 2015-10-30 NOTE — H&P (Signed)
[facility] History and Physical  [name] NOB:[id number] DOB: 01-06-1946 DOA: 10/29/2015  Referring physician: Shirlyn Russell, M.D. PCP: Kerry Richards, MD   Chief Complaint: Elevated ammonia.  HPI: [name] is a 70 y.o. female with below past medical history who was brought by her husband to the emergency department due to increased confusion since Thursday. This is associated with decreased Frequency of bowel movements. In the past 3 days, the patient has only had 1 BM a performed, but mildly soft stools despite taking lactulose 30 g twice a day. There has not been any fever, chills, productive cough, chest pain, nausea, emesis, melena or hematochezia. There has not been any GU symptoms.  Workup in the emergency department shows an ammonia level of 108 mmol per liter. Her husband states that symptoms are similar to previous episodes of elevated ammonia level.  Review of Systems:  Unable to review.  Past Medical History  Diagnosis Date  . Hx of shoulder replacement   . Hypertension   . Hyperlipidemia   . COPD (chronic obstructive pulmonary disease) ([facility])   . Vitamin D deficiency   . IBS (irritable bowel syndrome)   . Fibromyalgia   . Hepatic cirrhosis [facility])    Past Surgical History  Procedure Laterality Date  . Hip arthroplasty Bilateral   . Shoulder surgery Right   . Back surgery    . Cholecystectomy    . Neck surgery    . Esophagogastroduodenoscopy  07/16/2012    Procedure: ESOPHAGOGASTRODUODENOSCOPY (EGD);  Surgeon: Inda Castle, MD;  Location: [facility];  Service: Endoscopy;  Laterality: N/A;  . Gastric varices banding  07/16/2012    Procedure: GASTRIC VARICES BANDING;  Surgeon: Inda Castle, MD;  Location: [facility];  Service: Endoscopy;  Laterality: N/A;   Social History:  reports that she quit smoking about a year ago. Her smoking use included Cigarettes. She has a 50 pack-year smoking history. She has never used smokeless  tobacco. She reports that she drinks alcohol. She reports that she does not use illicit drugs.  Allergies  Allergen Reactions  . Atorvastatin Other (See Comments)    Doesn't remember  . Diphenhydramine Hcl (Sleep) Hives  . Gemfibrozil Other (See Comments)    Doesn't remember  . Hydrocodone-Acetaminophen Other (See Comments)    Doesn't remember  . Loratadine Hives  . Lorazepam Hives  . Simvastatin Other (See Comments)  . Sulfamethoxazole Hives  . Sulfonamide Derivatives Hives    Family History  Problem Relation Age of Onset  . Colon cancer Neg Hx   . Diabetes Brother   . Heart disease Sister     A Fib  . Heart disease Mother     CHF    Prior to Admission medications   Medication Sig Start Date End Date Taking? Authorizing Provider  allopurinol (ZYLOPRIM) 300 MG tablet 1/2-1 tablet daily for gout Patient taking differently: Take 300 mg by mouth daily.  03/03/15 03/02/18 Yes Unk Pinto, MD  ALPRAZolam Duanne Moron) 0.5 MG tablet 1/2-1 pil up to 3 times a day for anxiety Patient taking differently: Take 1.5 mg by mouth 3 (three) times daily as needed for anxiety. 1/2-1 pil up to 3 times a day for anxiety 10/19/15  Yes Vicie Mutters, PA-C  amitriptyline (ELAVIL) 10 MG tablet Take 1 tablet (10 mg total) by mouth 3 (three) times daily as needed. Patient taking differently: Take 1 tablet (10 mg total) nightly. 10/05/15  Yes Unk Pinto, MD  aspirin 81 MG tablet Take  81 mg by mouth daily.   Yes Historical Provider, MD  benzonatate (TESSALON) 100 MG capsule Take 100 mg by mouth 3 (three) times daily as needed for cough.   Yes Historical Provider, MD  bisoprolol (ZEBETA) 10 MG tablet Take 1 tablet (10 mg total) by mouth daily. 09/13/15  Yes Unk Pinto, MD  citalopram (CELEXA) 40 MG tablet Take 40 mg by mouth 2 (two) times daily.   Yes Historical Provider, MD  cyclobenzaprine (FLEXERIL) 10 MG tablet Take 1 tablet (10 mg total) by mouth 3 (three) times daily as needed for muscle spasms.  11/17/14  Yes Courtney Forcucci, PA-C  ferrous sulfate 325 (65 FE) MG tablet TAKE 1 TABLET TWICE DAILY Patient taking differently: TAKE 1 TABLET ONCE DAILY 01/19/13  Yes Inda Castle, MD  furosemide (LASIX) 80 MG tablet Take 80 mg by mouth 2 (two) times daily.   Yes Historical Provider, MD  gabapentin (NEURONTIN) 300 MG capsule take 1 capsule by mouth in the morning, 1 capsule in the afternoon and 1-3 capsules at night Patient taking differently: Take 300 mg by mouth 3 (three) times daily.  09/27/15  Yes Vicie Mutters, PA-C  ipratropium (ATROVENT) 0.02 % nebulizer solution use 1 vial in nebulizer EVERY 4 HOURS AS NEEDED FOR WHEEZING OR SHORTNESS OF BREATH 08/24/15  Yes Unk Pinto, MD  lactulose [facility]) 10 GM/15ML solution Take 30 ml (1 ounce) 2 x day 10/05/15 10/04/16 Yes Unk Pinto, MD  Magnesium 250 MG TABS Take 250 mg by mouth daily at 6 (six) AM.   Yes Historical Provider, MD  metFORMIN (GLUCOPHAGE) 500 MG tablet Take 500 mg by mouth 2 (two) times daily.    Yes Historical Provider, MD  metolazone (ZAROXOLYN) 5 MG tablet Take 2.5 mg by mouth 2 (two) times a week. Wed and Sun   Yes Historical Provider, MD  ondansetron (ZOFRAN) 4 MG tablet Take 1 tablet (4 mg total) by mouth daily as needed for nausea or vomiting. 10/06/15 10/05/16 Yes Vicie Mutters, PA-C  OXYGEN Inhale 3 L/min into the lungs daily as needed (On exertion and at home).    Yes Historical Provider, MD  pantoprazole (PROTONIX) 40 MG tablet Take 1 tablet (40 mg total) by mouth 2 (two) times daily. 09/05/15  Yes Vicie Mutters, PA-C  potassium chloride (K-DUR) 10 MEQ tablet Take 2 tablets (20 mEq total) by mouth daily. 10/23/15  Yes Vicie Mutters, PA-C  spironolactone (ALDACTONE) 25 MG tablet TAKE 1 TABLET BY MOUTH THREE TIMES DAILY FOR FLUIDS AND SWELLING Patient taking differently: TAKE 1 TABLET BY MOUTH ONCE DAILY. 09/05/15  Yes Vicie Mutters, PA-C  tamoxifen (NOLVADEX) 20 MG tablet Take 1 tablet (20 mg total) by mouth daily.  04/26/15  Yes Courtney Forcucci, PA-C  traMADol (ULTRAM) 50 MG tablet Take 1 tablet (50 mg total) by mouth every 6 (six) hours as needed. Patient taking differently: Take 50 mg by mouth every 6 (six) hours as needed for moderate pain.  09/27/15 09/26/16 Yes Courtney Forcucci, PA-C  citalopram (CELEXA) 20 MG tablet Take 1 tablet (20 mg total) by mouth daily. 09/24/15   Orson Eva, MD  doxycycline (VIBRAMYCIN) 100 MG capsule Take 2 capsules at once on a full stomach, then take 1 capsule after supper today ,  then 1 capsule   2 x day on a full stomach for infection Patient not taking: Reported on 10/29/2015 10/05/15 11/05/15  Unk Pinto, MD  furosemide (LASIX) 40 MG tablet Take 1-2 tablets (40-80 mg total) by mouth 2 (two)  times daily. 80 mg in am and 40 mg in pm Patient not taking: Reported on 10/29/2015 08/09/15   Caren Griffins, MD  guaiFENesin (ROBITUSSIN) 100 MG/5ML liquid Take 5-10 mLs (100-200 mg total) by mouth every 4 (four) hours as needed for cough. Patient not taking: Reported on 10/29/2015 07/26/15   Shary Decamp, PA-C   Physical Exam: Filed Vitals:   10/29/15 1833 10/29/15 2213 10/29/15 2303  BP: 133/70 133/70 154/54  Pulse: 72 72 80  Temp: 98.3 F (36.8 C) 98.6 F (37 C) 98 F (36.7 C)  TempSrc: Oral Oral Oral  Resp: 18 18 18   Height:   5' 2.5" (1.588 m)  Weight:   77.1 kg (169 lb 15.6 oz)  SpO2: 100% 100% 100%    Wt Readings from Last 3 Encounters:  10/29/15 77.1 kg (169 lb 15.6 oz)  10/12/15 81.647 kg (180 lb)  09/27/15 83.915 kg (185 lb)    General:  Appears calm and comfortable Eyes: PERRL, normal lids, irises & conjunctiva ENT: grossly normal hearing, lips & tongue Neck: no LAD, masses or thyromegaly Cardiovascular: RRR, no m/r/g. No LE edema. Telemetry: SR, no arrhythmias  Respiratory: CTA bilaterally, no w/r/r. Mildly decreased respiratory effort. Abdomen: Distended, ascites, soft, mild diffuse tenderness, no guarding, no rebound. Skin: no rash or induration seen on  limited exam Musculoskeletal: grossly normal tone BUE/BLE Psychiatric: Mildly drowsy Neurologic: Awake, alert, partially oriented to time and situation, grossly non-focal.          Labs on Admission:  Basic Metabolic Panel:  Recent Labs Lab 10/29/15 1930  NA 136  K 3.7  CL 90*  CO2 31  GLUCOSE 177*  BUN 38*  CREATININE 1.46*  CALCIUM 9.7   Liver Function Tests:  Recent Labs Lab 10/29/15 1930  AST 44*  ALT 37  ALKPHOS 100  BILITOT 0.9  PROT 6.8  ALBUMIN 3.9    Recent Labs Lab 10/29/15 1930  LIPASE 71*    Recent Labs Lab 10/29/15 1939  AMMONIA 108*   CBC:  Recent Labs Lab 10/29/15 1930  WBC 9.8  HGB 12.4  HCT 37.8  MCV 100.3*  PLT 95*    BNP (last 3 results)  Recent Labs  07/26/15 1300 09/21/15 1650  BNP 58.8 27.1       Assessment/Plan Principal Problem:   Hepatic encephalopathy (HCC) Admit to telemetry. Continue supplemental oxygen. Continue lactulose. Held medications that would cause drowsiness. Dulcolax suppository when necessary. Tapwater enema when necessary. Check ammonia level in the morning.  Active Problems:   Anemia   Essential hypertension Continue bisoprolol 10 mg by mouth daily. Continue diuretics. Monitor blood pressure, BUN/creatinine and electrolytes.    COPD (chronic obstructive pulmonary disease) (HCC) Stable. Continue supplemental oxygen. Bronchodilators when needed.    GERD Continue proton pump and give her.    DM type 2 causing CKD stage 3 (HCC) CBG monitoring. Continue metformin.   Code Status: Full code. DVT Prophylaxis:SCDs. Family Communication: Her husband was present in the room. Disposition Plan: Admit to monitor closely, continue lactulose and induce BM if needed.  Time spent: Over 70 minutes were spent in the process of this admission.  Reubin Milan, M.D. [facility] Pager [phone].

## 2015-10-30 NOTE — Progress Notes (Signed)
   [facility]                                                                              Patient Demographics  [name], is a 70 y.o. female, DOB - [birth year], OZH:[id number]  Admit date - 10/29/2015   Admitting Physician Reubin Milan, MD  Outpatient Primary MD for the patient is Alesia Richards, MD  LOS - 1   Chief Complaint  Patient presents with  . Abdominal Pain  . Fatigue      HPI on 10/30/2015 by Dr. Gerri Lins [name] is a 70 y.o. female with below past medical history who was brought by her husband to the emergency department due to increased confusion since Thursday. This is associated with decreased Frequency of bowel movements. In the past 3 days, the patient has only had 1 BM a performed, but mildly soft stools despite taking lactulose 30 g twice a day. There has not been any fever, chills, productive cough, chest pain, nausea, emesis, melena or hematochezia. There has not been any GU symptoms.  Workup in the emergency department shows an ammonia level of 108 mmol per liter. Her husband states that symptoms are similar to previous episodes of elevated ammonia level.  Assessment & Plan   Patient admitted earlier today by Dr. Tennis Must.  See H&P for details. Agree with assessment and plan.  Hepatic encephalopathy secondary to NASH cirrhosis -Upon admission, ammonia level 108, currently 48 -Continue lactulose -Continue to monitor ammonia level -Continue Lasix, spironolactone, metolazone  Essential hypertension -Continue bisoprolol, Lasix, spironolactone, metolazone  COPD -Currently stable, no wheezing on exam -Continue nebs as needed  GERD -Continue PPI  Chronic diastolic heart failure -Echocardiogram 08/07/2005 showed EF of 62-95%, grade 1 diastolic dysfunction -Continue diuretics and bisoprolol -Currently euvolemic -Monitor intake and output, daily weight  Diabetes mellitus, type II -Will hold metformin -Place  patient on insulin sliding scale CBG monitoring  Chronic kidney disease, stage III -Baseline creatinine approximately 1.4 -Currently stable, continue to monitor BMP  Depression/anxiety -Continue Celexa -Will restart Xanax as needed  Thrombocytopenia, chronic -Secondary to liver disease -No signs of bleeding  -continue to monitor CBC  Code Status: Full  Family Communication: None at bedside  Disposition Plan: Admitted  Time Spent in minutes   30 minutes  Procedures  None  Consults   None  DVT Prophylaxis  SCDs  Arvle Grabe D.O. on 10/30/2015 at 10:45 AM  Between 7am to 7pm - Pager - [phone]  After 7pm go to [email or web address] - password [email or web address]  And look for the night coverage person covering for me after hours  [facility] Office  [phone]

## 2015-10-31 DIAGNOSIS — I10 Essential (primary) hypertension: Secondary | ICD-10-CM | POA: Diagnosis not present

## 2015-10-31 DIAGNOSIS — E1122 Type 2 diabetes mellitus with diabetic chronic kidney disease: Secondary | ICD-10-CM | POA: Diagnosis not present

## 2015-10-31 DIAGNOSIS — K210 Gastro-esophageal reflux disease with esophagitis: Secondary | ICD-10-CM

## 2015-10-31 DIAGNOSIS — J418 Mixed simple and mucopurulent chronic bronchitis: Secondary | ICD-10-CM

## 2015-10-31 DIAGNOSIS — N183 Chronic kidney disease, stage 3 (moderate): Secondary | ICD-10-CM

## 2015-10-31 DIAGNOSIS — K7290 Hepatic failure, unspecified without coma: Secondary | ICD-10-CM | POA: Diagnosis not present

## 2015-10-31 LAB — COMPREHENSIVE METABOLIC PANEL
ALT: 34 U/L (ref 14–54)
AST: 34 U/L (ref 15–41)
Albumin: 3.7 g/dL (ref 3.5–5.0)
Alkaline Phosphatase: 84 U/L (ref 38–126)
Anion gap: 11 (ref 5–15)
BILIRUBIN TOTAL: 1.1 mg/dL (ref 0.3–1.2)
BUN: 36 mg/dL — AB (ref 6–20)
CHLORIDE: 93 mmol/L — AB (ref 101–111)
CO2: 35 mmol/L — ABNORMAL HIGH (ref 22–32)
Calcium: 9.2 mg/dL (ref 8.9–10.3)
Creatinine, Ser: 1.61 mg/dL — ABNORMAL HIGH (ref 0.44–1.00)
GFR, EST AFRICAN AMERICAN: 37 mL/min — AB (ref >60)
GFR, EST NON AFRICAN AMERICAN: 32 mL/min — AB (ref >60)
Glucose, Bld: 114 mg/dL — ABNORMAL HIGH (ref 65–99)
POTASSIUM: 3.5 mmol/L (ref 3.5–5.1)
SODIUM: 139 mmol/L (ref 135–145)
TOTAL PROTEIN: 6.1 g/dL — AB (ref 6.5–8.1)

## 2015-10-31 LAB — CBC
HCT: 36 % (ref 36.0–46.0)
Hemoglobin: 11.7 g/dL — ABNORMAL LOW (ref 12.0–15.0)
MCH: 33.4 pg (ref 26.0–34.0)
MCHC: 32.5 g/dL (ref 30.0–36.0)
MCV: 102.9 fL — ABNORMAL HIGH (ref 78.0–100.0)
PLATELETS: 89 10*3/uL — AB (ref 150–400)
RBC: 3.5 MIL/uL — ABNORMAL LOW (ref 3.87–5.11)
RDW: 15.2 % (ref 11.5–15.5)
WBC: 9.8 10*3/uL (ref 4.0–10.5)

## 2015-10-31 LAB — GLUCOSE, CAPILLARY: GLUCOSE-CAPILLARY: 104 mg/dL — AB (ref 65–99)

## 2015-10-31 LAB — AMMONIA: AMMONIA: 41 umol/L — AB (ref 9–35)

## 2015-10-31 NOTE — Discharge Summary (Signed)
Physician Discharge Summary  CORALIE STANKE WLN:[id number] DOB: March 14, 1946 DOA: 10/29/2015  PCP: Alesia Richards, MD  Admit date: 10/29/2015 Discharge date: 10/31/2015  Time spent: 45 minutes  Recommendations for Outpatient Follow-up:  Patient will be discharged to home.  Patient will need to follow up with primary care provider within one week of discharge.  Patient should continue medications as prescribed.  Patient should follow a heart healthy/carb modified diet.   Discharge Diagnoses:  Hepatic Encephalopathy secondary to [facility] cirrhosis Essential hypertension COPD GERD Chronic diastolic heart failure Diabetes mellitus, type II Chronic kidney disease stage III Depression/anxiety Thrombocytopenia  Discharge Condition: Stable  Diet recommendation: Heart healthy/carb modified  Filed Weights   10/29/15 2303  Weight: 77.1 kg (169 lb 15.6 oz)    History of present illness:  on 10/30/2015 by Dr. Gerri Lins Lakeya Mulka is a 70 y.o. female with below past medical history who was brought by her husband to the emergency department due to increased confusion since Thursday. This is associated with decreased Frequency of bowel movements. In the past 3 days, the patient has only had 1 BM a performed, but mildly soft stools despite taking lactulose 30 g twice a day. There has not been any fever, chills, productive cough, chest pain, nausea, emesis, melena or hematochezia. There has not been any GU symptoms.  Workup in the emergency department shows an ammonia level of 108 mmol per liter. Her husband states that symptoms are similar to previous episodes of elevated ammonia level.  Hospital Course:  Hepatic encephalopathy secondary to NASH cirrhosis -Upon admission, ammonia level 108, currently 41 -Continue lactulose- spoke to patient regarding continue lactulose to maintain 2-3 loose bowel movements per day. -Continue Lasix, spironolactone, metolazone -Follow up with  gastroenterologist  Essential hypertension -Continue bisoprolol, Lasix, spironolactone, metolazone  COPD -Currently stable, no wheezing on exam -Continue nebs as needed  GERD -Continue PPI  Chronic diastolic heart failure -Echocardiogram 08/07/2005 showed EF of 74-08%, grade 1 diastolic dysfunction -Continue diuretics and bisoprolol -Currently euvolemic -Monitor intake and output, daily weight  Diabetes mellitus, type II -Metformin held, may restart upon discharge -Follow up with PCP given CKD  Chronic kidney disease, stage III -Baseline creatinine approximately 1.4 -Currently stable  Depression/anxiety -Continue Celexa -Continue Xanax as needed  Thrombocytopenia, chronic -Secondary to liver disease -No signs of bleeding   Procedures  None  Consults  None  Discharge Exam: Filed Vitals:   10/30/15 2200 10/31/15 0421  BP: 116/46 93/51  Pulse: 72 70  Temp: 98.4 F (36.9 C) 98.4 F (36.9 C)  Resp: 18 18     General: Well developed, well nourished, NAD  HEENT: NCAT,mucous membranes moist.  Cardiovascular: S1 S2 auscultated, RRR, no murmurs  Respiratory: Clear to auscultation bilaterally   Abdomen: Soft, nontender, nondistended, + bowel sounds  Extremities: warm dry without cyanosis clubbing or edema  Neuro: AAOx3, nonfocal  Psych: Normal affect and demeanor  Discharge Instructions      Discharge Instructions    Discharge instructions    Complete by:  As directed   Patient will be discharged to home.  Patient will need to follow up with primary care provider within one week of discharge.  Patient should continue medications as prescribed.  Patient should follow a heart healthy/carb modified diet.            Medication List    STOP taking these medications        doxycycline 100 MG capsule  Commonly known as:  VIBRAMYCIN  guaiFENesin 100 MG/5ML liquid  Commonly known as:  ROBITUSSIN      TAKE these medications         allopurinol 300 MG tablet  Commonly known as:  ZYLOPRIM  1/2-1 tablet daily for gout     ALPRAZolam 0.5 MG tablet  Commonly known as:  XANAX  1/2-1 pil up to 3 times a day for anxiety     amitriptyline 10 MG tablet  Commonly known as:  ELAVIL  Take 1 tablet (10 mg total) by mouth 3 (three) times daily as needed.     aspirin 81 MG tablet  Take 81 mg by mouth daily.     benzonatate 100 MG capsule  Commonly known as:  TESSALON  Take 100 mg by mouth 3 (three) times daily as needed for cough.     bisoprolol 10 MG tablet  Commonly known as:  ZEBETA  Take 1 tablet (10 mg total) by mouth daily.     citalopram 40 MG tablet  Commonly known as:  CELEXA  Take 40 mg by mouth 2 (two) times daily.     cyclobenzaprine 10 MG tablet  Commonly known as:  FLEXERIL  Take 1 tablet (10 mg total) by mouth 3 (three) times daily as needed for muscle spasms.     ferrous sulfate 325 (65 FE) MG tablet  TAKE 1 TABLET TWICE DAILY     furosemide 80 MG tablet  Commonly known as:  LASIX  Take 80 mg by mouth 2 (two) times daily.     gabapentin 300 MG capsule  Commonly known as:  NEURONTIN  take 1 capsule by mouth in the morning, 1 capsule in the afternoon and 1-3 capsules at night     ipratropium 0.02 % nebulizer solution  Commonly known as:  ATROVENT  use 1 vial in nebulizer EVERY 4 HOURS AS NEEDED FOR WHEEZING OR SHORTNESS OF BREATH     lactulose 10 GM/15ML solution  Commonly known as:  CHRONULAC  Take 30 ml (1 ounce) 2 x day     Magnesium 250 MG Tabs  Take 250 mg by mouth daily at 6 (six) AM.     metFORMIN 500 MG tablet  Commonly known as:  GLUCOPHAGE  Take 500 mg by mouth 2 (two) times daily.     metolazone 5 MG tablet  Commonly known as:  ZAROXOLYN  Take 2.5 mg by mouth 2 (two) times a week. Wed and Sun     ondansetron 4 MG tablet  Commonly known as:  ZOFRAN  Take 1 tablet (4 mg total) by mouth daily as needed for nausea or vomiting.     OXYGEN  Inhale 3 L/min into the lungs daily  as needed (On exertion and at home).     pantoprazole 40 MG tablet  Commonly known as:  PROTONIX  Take 1 tablet (40 mg total) by mouth 2 (two) times daily.     potassium chloride 10 MEQ tablet  Commonly known as:  K-DUR  Take 2 tablets (20 mEq total) by mouth daily.     spironolactone 25 MG tablet  Commonly known as:  ALDACTONE  TAKE 1 TABLET BY MOUTH THREE TIMES DAILY FOR FLUIDS AND SWELLING     tamoxifen 20 MG tablet  Commonly known as:  NOLVADEX  Take 1 tablet (20 mg total) by mouth daily.     traMADol 50 MG tablet  Commonly known as:  ULTRAM  Take 1 tablet (50 mg total) by mouth every 6 (six) hours  as needed.       Allergies  Allergen Reactions  . Atorvastatin Other (See Comments)    Doesn't remember  . Diphenhydramine Hcl (Sleep) Hives  . Gemfibrozil Other (See Comments)    Doesn't remember  . Hydrocodone-Acetaminophen Other (See Comments)    Doesn't remember  . Loratadine Hives  . Lorazepam Hives  . Simvastatin Other (See Comments)  . Sulfamethoxazole Hives  . Sulfonamide Derivatives Hives   Follow-up Information    Follow up with MCKEOWN,WILLIAM DAVID, MD. Schedule an appointment as soon as possible for a visit in 1 week.   Specialty:  Internal Medicine   Why:  Hospital follow up   Contact information:   [address] [place] [address] [phone]        The results of significant diagnostics from this hospitalization (including imaging, microbiology, ancillary and laboratory) are listed below for reference.    Significant Diagnostic Studies: No results found.  Microbiology: No results found for this or any previous visit (from the past 240 hour(s)).   Labs: Basic Metabolic Panel:  Recent Labs Lab 10/29/15 1930 10/30/15 0453 10/31/15 0540  NA 136 138 139  K 3.7 3.5 3.5  CL 90* 92* 93*  CO2 31 31 35*  GLUCOSE 177* 157* 114*  BUN 38* 37* 36*  CREATININE 1.46* 1.47* 1.61*  CALCIUM 9.7 9.7 9.2   Liver Function  Tests:  Recent Labs Lab 10/29/15 1930 10/30/15 0453 10/31/15 0540  AST 44* 41 34  ALT 37 38 34  ALKPHOS 100 94 84  BILITOT 0.9 1.1 1.1  PROT 6.8 6.6 6.1*  ALBUMIN 3.9 3.8 3.7    Recent Labs Lab 10/29/15 1930  LIPASE 71*    Recent Labs Lab 10/29/15 1939 10/30/15 0453 10/31/15 0540  AMMONIA 108* 48* 41*   CBC:  Recent Labs Lab 10/29/15 1930 10/30/15 0453 10/31/15 0540  WBC 9.8 8.2 9.8  NEUTROABS  --  6.1  --   HGB 12.4 12.1 11.7*  HCT 37.8 37.6 36.0  MCV 100.3* 102.2* 102.9*  PLT 95* 100* 89*   Cardiac Enzymes: No results for input(s): CKTOTAL, CKMB, CKMBINDEX, TROPONINI in the last 168 hours. BNP: BNP (last 3 results)  Recent Labs  07/26/15 1300 09/21/15 1650  BNP 58.8 27.1    ProBNP (last 3 results) No results for input(s): PROBNP in the last 8760 hours.  CBG:  Recent Labs Lab 10/30/15 0731 10/30/15 1202 10/30/15 1706 10/30/15 2128 10/31/15 0738  GLUCAP 110* 158* 128* 152* 104*       Signed:  Jordany Russett  [facility] 10/31/2015, 10:41 AM

## 2015-10-31 NOTE — Discharge Instructions (Signed)
Hepatic Encephalopathy Hepatic encephalopathy is a loss of brain function from advanced liver disease. The effects of the condition depend on the type of liver damage and how severe it is. In some cases, hepatic encephalopathy can be reversed. CAUSES The exact cause of hepatic encephalopathy is not known. RISK FACTORS You have a higher risk of getting this condition if your liver is damaged. When the liver is damaged harmful substances called toxins can build up in the body. Certain toxins, such as ammonia, can harm your brain. Conditions that can cause liver damage include:  An infection.  Dehydration.  Intestinal bleeding.  Drinking too much alcohol.  Taking certain medicines, including tranquilizers, water pills (diuretics), antidepressants, or sleeping pills. SIGNS AND SYMPTOMS Signs and symptoms may develop suddenly. Or, they may develop slowly and get worse gradually. Symptoms can range from mild to severe. Mild Hepatic Encephalopathy  Mild confusion.  Personality and mood changes.  Anxiety and agitation.  Drowsiness.  Loss of mental abilities.  Musty or sweet-smelling breath. Worsening or Severe Hepatic Encephalopathy  Slowed movement.  Slurred speech.  Extreme personality changes.  Disorientation.  Abnormal shaking or flapping of the hands.  Coma. DIAGNOSIS To make a diagnosis, your health care provider will do a physical exam. To rule out other causes of your signs and symptoms, he or she may order tests. You may have:  Blood tests. These may be done to check your ammonia level, measure how long it takes your blood to clot, and check for infection.  Liver function tests. These may be done to check how well your liver is working.  MRI and CT scans. These may be done to check for a brain disorder.  Electroencephalogram (EEG). This may be done to measure the electrical activity in your brain. TREATMENT The first step in treatment is identifying and  treating possible triggers. The next step is involves taking medicine to lower the level of toxins in the body and to prevent ammonia from building up. You may need to take:  Antibiotics to reduce the ammonia-producing bacteria in your gut.  Lactulose to help flush ammonia from the gut. HOME CARE INSTRUCTIONS Eating and Drinking  Follow a low-protein diet that includes plenty of fruits, vegetables, and whole grains, as directed by your health care provider. Ammonia is produced when you digest high-protein foods.  Work with a Microbiologist or with your health care provider to make sure you are getting the right balance of protein and minerals.  Drink enough fluids to keep your urine clear or pale yellow. Drinking plenty of water helps prevent constipation.  Do not drink alcohol or use illegal drugs. Medicines  Only take medicine as directed by your health care provider.  If you were prescribed an antibiotic medicine, finish it all even if you start to feel better.  Do not start any new medicines, including over-the-counter medicines, without first checking with your health care provider. SEEK MEDICAL CARE IF:  You have new symptoms.  Your symptoms change.  Your symptoms get worse.  You have a fever.  You are constipated.  You have persistent nausea, vomiting, or diarrhea. SEEK IMMEDIATE MEDICAL CARE IF:  You become very confused or drowsy.  You vomit blood or material that looks like coffee grounds.  Your stool is bloody or black or looks like tar.   This information is not intended to replace advice given to you by your health care provider. Make sure you discuss any questions you have with your health care provider.  Document Released: 10/02/2006 Document Revised: 08/13/2014 Document Reviewed: 03/10/2014 Elsevier Interactive Patient Education [employer].

## 2015-11-02 ENCOUNTER — Ambulatory Visit (INDEPENDENT_AMBULATORY_CARE_PROVIDER_SITE_OTHER): Payer: PPO | Admitting: Internal Medicine

## 2015-11-02 ENCOUNTER — Encounter: Payer: Self-pay | Admitting: Internal Medicine

## 2015-11-02 VITALS — BP 126/60 | HR 76 | Temp 98.2°F | Resp 18

## 2015-11-02 DIAGNOSIS — K7469 Other cirrhosis of liver: Secondary | ICD-10-CM

## 2015-11-02 DIAGNOSIS — K7460 Unspecified cirrhosis of liver: Secondary | ICD-10-CM | POA: Diagnosis not present

## 2015-11-02 MED ORDER — CLOTRIMAZOLE-BETAMETHASONE 1-0.05 % EX CREA: TOPICAL_CREAM | CUTANEOUS | Status: DC

## 2015-11-02 NOTE — Progress Notes (Signed)
Assessment and Plan:   1. Other cirrhosis of liver (HCC) -cont lactulose - Ammonia  2.  Yeast dermatitis -clotrimazole betamethasone cream -cont nystatin powder once rash resolves.    HPI 70 y.o.female presents for post hospital follow-up for admission of hyperammonia secondary to NASH cirrhosis.  Patient was admitted with ammonia level of 108 secondary to not having bowel movements on lactulose.  The hospital was able to lower ammonia levels to 48.   Patient reports that they have been doing well.  She has been taking lactulose 2-3 doses per day.  She is having loose stools at least twice daily, although sometimes she is having them in the night which can make a mess.  She is currently having a night time dose of lactulose.  She does have a rash on her groin and around the line of the diaper.  They did give her nystatin powder in the hopsital and it has improved.  She cannot go without the diapers though.   She  is taking their medication.  She is not having difficulty with their medications.  They report no adverse reactions.  They are going to see Dr. Havery Moros in a couple weeks.  They are curious about liver transplant if a family member was willing to give part of her liver.      Past Medical History  Diagnosis Date  . Hx of shoulder replacement   . Hypertension   . Hyperlipidemia   . COPD (chronic obstructive pulmonary disease) ([place])   . Vitamin D deficiency   . IBS (irritable bowel syndrome)   . Fibromyalgia   . Hepatic cirrhosis (HCC)      Allergies  Allergen Reactions  . Atorvastatin Other (See Comments)    Doesn't remember  . Diphenhydramine Hcl (Sleep) Hives  . Gemfibrozil Other (See Comments)    Doesn't remember  . Hydrocodone-Acetaminophen Other (See Comments)    Doesn't remember  . Loratadine Hives  . Lorazepam Hives  . Simvastatin Other (See Comments)  . Sulfamethoxazole Hives  . Sulfonamide Derivatives Hives      Current Outpatient Prescriptions on File  Prior to Visit  Medication Sig Dispense Refill  . allopurinol (ZYLOPRIM) 300 MG tablet 1/2-1 tablet daily for gout (Patient taking differently: Take 300 mg by mouth daily. ) 90 tablet 1  . ALPRAZolam (XANAX) 0.5 MG tablet 1/2-1 pil up to 3 times a day for anxiety (Patient taking differently: Take 1.5 mg by mouth 3 (three) times daily as needed for anxiety. 1/2-1 pil up to 3 times a day for anxiety) 60 tablet 2  . amitriptyline (ELAVIL) 10 MG tablet Take 1 tablet (10 mg total) by mouth 3 (three) times daily as needed. (Patient taking differently: Take 1 tablet (10 mg total) nightly.) 90 tablet 1  . aspirin 81 MG tablet Take 81 mg by mouth daily.    . benzonatate (TESSALON) 100 MG capsule Take 100 mg by mouth 3 (three) times daily as needed for cough.    . bisoprolol (ZEBETA) 10 MG tablet Take 1 tablet (10 mg total) by mouth daily. 30 tablet 11  . citalopram (CELEXA) 40 MG tablet Take 40 mg by mouth 2 (two) times daily.    . cyclobenzaprine (FLEXERIL) 10 MG tablet Take 1 tablet (10 mg total) by mouth 3 (three) times daily as needed for muscle spasms. 30 tablet 0  . ferrous sulfate 325 (65 FE) MG tablet TAKE 1 TABLET TWICE DAILY (Patient taking differently: TAKE 1 TABLET ONCE DAILY) 60 tablet 5  .  furosemide (LASIX) 80 MG tablet Take 80 mg by mouth 2 (two) times daily.    Marland Kitchen gabapentin (NEURONTIN) 300 MG capsule take 1 capsule by mouth in the morning, 1 capsule in the afternoon and 1-3 capsules at night (Patient taking differently: Take 300 mg by mouth 3 (three) times daily. ) 120 capsule 2  . ipratropium (ATROVENT) 0.02 % nebulizer solution use 1 vial in nebulizer EVERY 4 HOURS AS NEEDED FOR WHEEZING OR SHORTNESS OF BREATH 75 mL 3  . lactulose (CHRONULAC) 10 GM/15ML solution Take 30 ml (1 ounce) 2 x day 1892 mL 99  . Magnesium 250 MG TABS Take 250 mg by mouth daily at 6 (six) AM.    . metFORMIN (GLUCOPHAGE) 500 MG tablet Take 500 mg by mouth 2 (two) times daily.     . metolazone (ZAROXOLYN) 5 MG tablet  Take 2.5 mg by mouth 2 (two) times a week. Wed and Sun    . ondansetron (ZOFRAN) 4 MG tablet Take 1 tablet (4 mg total) by mouth daily as needed for nausea or vomiting. 30 tablet 1  . OXYGEN Inhale 3 L/min into the lungs daily as needed (On exertion and at home).     . pantoprazole (PROTONIX) 40 MG tablet Take 1 tablet (40 mg total) by mouth 2 (two) times daily. 180 tablet 1  . potassium chloride (K-DUR) 10 MEQ tablet Take 2 tablets (20 mEq total) by mouth daily. 60 tablet 6  . spironolactone (ALDACTONE) 25 MG tablet TAKE 1 TABLET BY MOUTH THREE TIMES DAILY FOR FLUIDS AND SWELLING (Patient taking differently: TAKE 1 TABLET BY MOUTH ONCE DAILY.) 90 tablet 1  . tamoxifen (NOLVADEX) 20 MG tablet Take 1 tablet (20 mg total) by mouth daily. 90 tablet 2  . traMADol (ULTRAM) 50 MG tablet Take 1 tablet (50 mg total) by mouth every 6 (six) hours as needed. (Patient taking differently: Take 50 mg by mouth every 6 (six) hours as needed for moderate pain. ) 90 tablet 0   No current facility-administered medications on file prior to visit.    ROS: all negative except above.   Physical Exam: There were no vitals filed for this visit. BP 126/60 mmHg  Pulse 76  Temp(Src) 98.2 F (36.8 C) (Temporal)  Resp 18 General Appearance: Chronically ill and fatigued appearing, non-toxic appearing in no apparent distress.  She is on pulse O2 nasal canula,also in wheel chair Eyes: PERRLA, EOMs, conjunctiva w/ no swelling or erythema or discharge Sinuses: No Frontal/maxillary tenderness ENT/Mouth: Ear canals clear without swelling or erythema.  TM's normal bilaterally with no retractions, bulging, or loss of landmarks.   Neck: Supple, thyroid normal, no notable JVD  Respiratory: Respiratory effort with some belly breathing but at patient's baseline, Clear breath sounds anteriorly and posteriorly bilaterally without rales, rhonchi, wheezing or stridor. No retractions or accessory muscle usage. Cardio: RRR with no MRGs.    Abdomen: Soft, + BS.  Mild generalized tenderness, no fluid wave, no guarding, rebound, hernias, masses.  Musculoskeletal: Full ROM, 5/5 strength, normal gait.  Skin: Warm, dry without rashes  Neuro: Awake and oriented X 3, Cranial nerves intact. Normal muscle tone, no cerebellar symptoms. Sensation intact.  Psych: normal affect, Insight and Judgment appropriate.     Starlyn Skeans, PA-C 2:36 PM [facility]

## 2015-11-03 LAB — AMMONIA: AMMONIA: 125 umol/L — AB (ref 16–53)

## 2015-11-04 ENCOUNTER — Ambulatory Visit (INDEPENDENT_AMBULATORY_CARE_PROVIDER_SITE_OTHER): Payer: PPO | Admitting: Internal Medicine

## 2015-11-04 ENCOUNTER — Encounter: Payer: Self-pay | Admitting: Internal Medicine

## 2015-11-04 VITALS — BP 112/60 | HR 74 | Ht 62.0 in | Wt 177.0 lb

## 2015-11-04 DIAGNOSIS — J449 Chronic obstructive pulmonary disease, unspecified: Secondary | ICD-10-CM

## 2015-11-04 DIAGNOSIS — J42 Unspecified chronic bronchitis: Secondary | ICD-10-CM

## 2015-11-04 DIAGNOSIS — J9611 Chronic respiratory failure with hypoxia: Secondary | ICD-10-CM | POA: Diagnosis not present

## 2015-11-04 DIAGNOSIS — I10 Essential (primary) hypertension: Secondary | ICD-10-CM

## 2015-11-04 DIAGNOSIS — J9612 Chronic respiratory failure with hypercapnia: Secondary | ICD-10-CM | POA: Diagnosis not present

## 2015-11-04 LAB — PULMONARY FUNCTION TEST
FEF 25-75 POST: 0.55 L/s
FEF 25-75 PRE: 0.33 L/s
FEF2575-%Change-Post: 66 %
FEF2575-%PRED-POST: 30 %
FEF2575-%Pred-Pre: 18 %
FEV1-%Change-Post: 20 %
FEV1-%Pred-Post: 42 %
FEV1-%Pred-Pre: 35 %
FEV1-PRE: 0.75 L
FEV1-Post: 0.9 L
FEV1FVC-%Change-Post: 1 %
FEV1FVC-%PRED-PRE: 71 %
FEV6-%CHANGE-POST: 18 %
FEV6-%PRED-POST: 61 %
FEV6-%Pred-Pre: 51 %
FEV6-POST: 1.63 L
FEV6-Pre: 1.37 L
FEV6FVC-%CHANGE-POST: 0 %
FEV6FVC-%PRED-POST: 103 %
FEV6FVC-%Pred-Pre: 104 %
FVC-%CHANGE-POST: 19 %
FVC-%PRED-POST: 58 %
FVC-%Pred-Pre: 49 %
FVC-Post: 1.64 L
FVC-Pre: 1.38 L
POST FEV1/FVC RATIO: 55 %
Post FEV6/FVC ratio: 99 %
Pre FEV1/FVC ratio: 54 %
Pre FEV6/FVC Ratio: 100 %

## 2015-11-04 MED ORDER — ALBUTEROL SULFATE (2.5 MG/3ML) 0.083% IN NEBU: 2.5000 mg | INHALATION_SOLUTION | RESPIRATORY_TRACT | Status: DC | PRN

## 2015-11-04 NOTE — Patient Instructions (Signed)
02 3lpm 24/7  Only use your albuterol/atrovent neb  as a rescue medication to be used if you can't catch your breath by resting or doing a relaxed purse lip breathing pattern.  - The less you use it, the better it will work when you need it. - Ok to use up to  every 4 hours if you must but call for immediate appointment if use goes up over your usual need or it stops working as well   If you are satisfied with your treatment plan,  let your doctor know and he/she can either refill your medications or you can return here when your prescription runs out.     If in any way you are not 100% satisfied,  please tell us.  If 100% better, tell your friends!  Pulmonary follow up is as needed

## 2015-11-04 NOTE — Progress Notes (Signed)
Spirometry pre and post done today. 

## 2015-11-04 NOTE — Progress Notes (Signed)
Subjective:    Patient ID: [name], female    DOB: Dec 25, 1945,    MRN: [id number]   Brief patient profile:  69 yowf with sob x 2014 dx as copd > quit smoking 11/05/14 at wt 160  Did some  better with chronic doe  but still needed 3lpm 24/7 then worse around tgiving 2016 and downhill since then so referred to pulmonary clinic 08/29/2015 by Dr Franne Forts    History of Present Illness  08/29/2015 1st [facility] office visit/ [name]  maint duoneb tid/ 02 3lpm 24/7  Chief Complaint  Patient presents with  . Pulmonary Consult    Referred for chronic bronchitis. Recent labs/cxr.   present doe x across the room but also limited by back pain and leg weakness chronically/ mostly c/w bound/ duoneb helps some Cough worse in am but no excess/ purulent sputum or mucus plugs rec Stop corgard and start bisoprolol  10 mg daily  Be sure you take pantoprazole 40 mg Take 30- 60 min before your first and last meals of the day  GERD diet.  Nebulizer ok to use up to four times daily    11/04/2015  f/u ov/[name] re: GOLD III copd / 02 dep Chief Complaint  Patient presents with  . Follow-up    Review PFT today. Pt reports having increased exertional SOB wihtout O2 - pt reports not using her O2 "all the time" at home.   feeling much better when uses 02 vs anything she's used before    No more cough on gerd rx/ rare need for prn atrovent   No obvious day to day or daytime variability or assoc excess/ purulent sputum or mucus plugs or hemoptysis or cp or chest tightness, subjective wheeze or overt sinus or hb symptoms. No unusual exp hx or h/o childhood pna/ asthma or knowledge of premature birth.  Sleeping ok without nocturnal  or early am exacerbation  of respiratory  c/o's or need for noct saba. Also denies any obvious fluctuation of symptoms with weather or environmental changes or other aggravating or alleviating factors except as outlined above   Current Medications, Allergies, Complete Past  Medical History, Past Surgical History, Family History, and Social History were reviewed in [employer] record.  ROS  The following are not active complaints unless bolded sore throat, dysphagia, dental problems, itching, sneezing,  nasal congestion or excess/ purulent secretions, ear ache,   fever, chills, sweats, unintended wt loss, classically pleuritic or exertional cp,  orthopnea pnd or leg swelling, presyncope, palpitations, abdominal pain, anorexia, nausea, vomiting, diarrhea  or change in bowel or bladder habits, change in stools or urine, dysuria,hematuria,  rash, arthralgias, visual complaints, headache, numbness, weakness or ataxia or problems with walking or coordination,  change in mood/affect or memory.               Objective:   Physical Exam  W/c bound elderly wf nad on 3lpm pulsed   11/04/2015        177   08/29/15 180 lb 6.4 oz (81.829 kg)  08/16/15 179 lb 6.4 oz (81.375 kg)  08/09/15 174 lb 9.7 oz (79.2 kg)    Vital signs reviewed  HEENT: nl dentition, turbinates, and oropharynx. Nl external ear canals without cough reflex   NECK :  without JVD/Nodes/TM/ nl carotid upstrokes bilaterally   LUNGS: no acc muscle use,  slt barrel contour, distant bs with very  faint end exp wheeze bilaterally and mod increased exp time  CV:  RRR  no s3 or murmur or increase in P2, no edema   ABD:  soft and nontender with nl inspiratory excursion in the supine position. No bruits or organomegaly, bowel sounds nl  MS:  Nl gait/ ext warm without deformities, calf tenderness, cyanosis or clubbing No obvious joint restrictions   SKIN: warm and dry without lesions    NEURO:  alert, approp, nl sensorium with  no motor deficits     I personally reviewed images and agree with radiology impression as follows:  CXR:   09/21/15  No acute cardiopulmonary disease. Aortic atherosclerosis.      Assessment & Plan:   Outpatient Encounter Prescriptions as of  11/04/2015  Medication Sig  . allopurinol (ZYLOPRIM) 300 MG tablet 1/2-1 tablet daily for gout (Patient taking differently: Take 300 mg by mouth daily. )  . ALPRAZolam (XANAX) 0.5 MG tablet 1/2-1 pil up to 3 times a day for anxiety (Patient taking differently: Take 1.5 mg by mouth 3 (three) times daily as needed for anxiety. 1/2-1 pil up to 3 times a day for anxiety)  . amitriptyline (ELAVIL) 10 MG tablet Take 1 tablet (10 mg total) by mouth 3 (three) times daily as needed. (Patient taking differently: Take 1 tablet (10 mg total) nightly.)  . aspirin 81 MG tablet Take 81 mg by mouth daily.  . benzonatate (TESSALON) 100 MG capsule Take 100 mg by mouth 3 (three) times daily as needed for cough.  . bisoprolol (ZEBETA) 10 MG tablet Take 1 tablet (10 mg total) by mouth daily.  . citalopram (CELEXA) 40 MG tablet Take 40 mg by mouth 2 (two) times daily.  . clotrimazole-betamethasone (LOTRISONE) cream Apply to affected area 2 times daily  . cyclobenzaprine (FLEXERIL) 10 MG tablet Take 1 tablet (10 mg total) by mouth 3 (three) times daily as needed for muscle spasms.  . ferrous sulfate 325 (65 FE) MG tablet TAKE 1 TABLET TWICE DAILY (Patient taking differently: TAKE 1 TABLET ONCE DAILY)  . furosemide (LASIX) 80 MG tablet Take 80 mg by mouth 2 (two) times daily.  Marland Kitchen gabapentin (NEURONTIN) 300 MG capsule take 1 capsule by mouth in the morning, 1 capsule in the afternoon and 1-3 capsules at night (Patient taking differently: Take 300 mg by mouth 3 (three) times daily. )  . ipratropium (ATROVENT) 0.02 % nebulizer solution use 1 vial in nebulizer EVERY 4 HOURS AS NEEDED FOR WHEEZING OR SHORTNESS OF BREATH  . lactulose (CHRONULAC) 10 GM/15ML solution Take 30 ml (1 ounce) 2 x day  . Magnesium 250 MG TABS Take 250 mg by mouth daily at 6 (six) AM.  . metFORMIN (GLUCOPHAGE) 500 MG tablet Take 500 mg by mouth 2 (two) times daily.   . metolazone (ZAROXOLYN) 5 MG tablet Take 2.5 mg by mouth 2 (two) times a week. Wed and  Sun  . ondansetron (ZOFRAN) 4 MG tablet Take 1 tablet (4 mg total) by mouth daily as needed for nausea or vomiting.  . OXYGEN Inhale 3 L/min into the lungs daily as needed (On exertion and at home).   . pantoprazole (PROTONIX) 40 MG tablet Take 1 tablet (40 mg total) by mouth 2 (two) times daily.  . potassium chloride (K-DUR) 10 MEQ tablet Take 2 tablets (20 mEq total) by mouth daily.  Marland Kitchen spironolactone (ALDACTONE) 25 MG tablet TAKE 1 TABLET BY MOUTH THREE TIMES DAILY FOR FLUIDS AND SWELLING (Patient taking differently: TAKE 1 TABLET BY MOUTH ONCE DAILY.)  . tamoxifen (NOLVADEX) 20 MG tablet  Take 1 tablet (20 mg total) by mouth daily.  . traMADol (ULTRAM) 50 MG tablet Take 1 tablet (50 mg total) by mouth every 6 (six) hours as needed. (Patient taking differently: Take 50 mg by mouth every 6 (six) hours as needed for moderate pain. )  . albuterol (PROVENTIL) (2.5 MG/3ML) 0.083% nebulizer solution Take 3 mLs (2.5 mg total) by nebulization every 4 (four) hours as needed for wheezing or shortness of breath.   No facility-administered encounter medications on file as of 11/04/2015.

## 2015-11-05 NOTE — Assessment & Plan Note (Signed)
Trial off corgard 08/29/2015 >>> - .PFT's  11/04/2015  FEV1 0.90 (42 % ) ratio 55  p 20 % improvement from saba p no rx prior to study   Since she does have reversibility reasonable to use the most specific BB here - (see separate a/p)   As I explained to this patient/husband in detail:  although there is severe copd present, it may not be clinically relevant:   it does not appear to be limiting activity tolerance any more than a set of worn tires limits someone from driving a car  around a parking lot.  A new set of Michelins might look good but would have no perceived impact on the performance of the car and would not be worth the cost and this is a major concern for them.  They can get alb/atrovent very cheaply thru [facility] if needed   This pt is so sedentary I don't recommend any more aggressive pulmonary rx than duoneb prn at this point unless limiting symptoms arise or acute exacerbations become as issue, neither of which is the case now.  I asked the patient to contact this office at any time in the future should either of these problems arise.    I had an extended discussion with the patient reviewing all relevant studies completed to date and  lasting 15 to 20 minutes of a 25 minute visit    Each maintenance medication was reviewed in detail including most importantly the difference between maintenance and prns and under what circumstances the prns are to be triggered using an action plan format that is not reflected in the computer generated alphabetically organized AVS.    Please see instructions for details which were reviewed in writing and the patient given a copy highlighting the part that I personally wrote and discussed at today's ov.

## 2015-11-05 NOTE — Assessment & Plan Note (Signed)
Changed corgard to bisoprolol 08/29/2015 due to copd/ab  Although the asthmatic component is minimal, doing well on the most specific generic BB on the market. Follow up per Primary Care planned

## 2015-11-05 NOTE — Assessment & Plan Note (Signed)
02 3lpm 24/7 per Dr Lawanna Kobus - [facility]03  08/16/15 = 35   Adequate control on present rx, reviewed > no change in rx needed

## 2015-11-09 ENCOUNTER — Ambulatory Visit: Payer: Self-pay | Admitting: Internal Medicine

## 2015-11-10 ENCOUNTER — Telehealth: Payer: Self-pay | Admitting: Gastroenterology

## 2015-11-10 ENCOUNTER — Ambulatory Visit (INDEPENDENT_AMBULATORY_CARE_PROVIDER_SITE_OTHER): Payer: PPO | Admitting: Gastroenterology

## 2015-11-10 ENCOUNTER — Other Ambulatory Visit (INDEPENDENT_AMBULATORY_CARE_PROVIDER_SITE_OTHER): Payer: PPO

## 2015-11-10 ENCOUNTER — Encounter: Payer: Self-pay | Admitting: Gastroenterology

## 2015-11-10 VITALS — BP 120/60 | HR 68

## 2015-11-10 DIAGNOSIS — K7469 Other cirrhosis of liver: Secondary | ICD-10-CM

## 2015-11-10 DIAGNOSIS — K7290 Hepatic failure, unspecified without coma: Secondary | ICD-10-CM | POA: Diagnosis not present

## 2015-11-10 DIAGNOSIS — I8500 Esophageal varices without bleeding: Secondary | ICD-10-CM | POA: Diagnosis not present

## 2015-11-10 DIAGNOSIS — K7682 Hepatic encephalopathy: Secondary | ICD-10-CM

## 2015-11-10 LAB — PROTIME-INR
INR: 1.1 ratio — AB (ref 0.8–1.0)
PROTHROMBIN TIME: 11.4 s (ref 9.6–13.1)

## 2015-11-10 LAB — COMPREHENSIVE METABOLIC PANEL
ALBUMIN: 3.7 g/dL (ref 3.5–5.2)
ALK PHOS: 86 U/L (ref 39–117)
ALT: 23 U/L (ref 0–35)
AST: 27 U/L (ref 0–37)
BUN: 25 mg/dL — AB (ref 6–23)
CALCIUM: 10.4 mg/dL (ref 8.4–10.5)
CO2: 37 mEq/L — ABNORMAL HIGH (ref 19–32)
Chloride: 87 mEq/L — ABNORMAL LOW (ref 96–112)
Creatinine, Ser: 1.48 mg/dL — ABNORMAL HIGH (ref 0.40–1.20)
GFR: 37.17 mL/min — AB (ref >60.00)
Glucose, Bld: 110 mg/dL — ABNORMAL HIGH (ref 70–99)
POTASSIUM: 3.6 meq/L (ref 3.5–5.1)
Sodium: 134 mEq/L — ABNORMAL LOW (ref 135–145)
TOTAL PROTEIN: 6.2 g/dL (ref 6.0–8.3)
Total Bilirubin: 0.7 mg/dL (ref 0.2–1.2)

## 2015-11-10 MED ORDER — RIFAXIMIN 550 MG PO TABS: 550.0000 mg | ORAL_TABLET | Freq: Two times a day (BID) | ORAL | Status: DC

## 2015-11-10 NOTE — Progress Notes (Signed)
HPI :  70 y/o female here for follow up visit for her cirrhosis. Former patient of Dr. Deatra Ina, she is new to me.   In short this patient has decompesnated cirrhosis, thought to be related to NASH. Decompensated with history of hepatic encephalopathy and esophageal varices (no bleeding). She denies any history of alcohol use.  She has been hospitalized 3 times since January for multiple medical problems. She has had electrolytes abnormalities and dehydration previously while on diuretics, also thought to have a component of adrenal insufficiency coming off of steroids, admitted in January. She was in February with weakness and again in later March with confusion and elevated ammonia levels, and concern for hepatic encephalopathy. She also had pnuemonia in February. She had not been taking lactulose prior to these hospitalizations. She was discharged from her last admission on the 27th of March.  .  She was admitted for mental status changes and thought to be hepatic encephalopathy, admitted for 2 days. Prior to the hospitalization she had been taking lactulose, but husband does not think enough and she was missing doses. She quickly improved with lactulose when admitted and her mental status normalized per husband. She did not have had a prior paracentesis while admitted. She has had some ongoing abdominal distension thought to be due to ascites, and LE edema, but this is not currently bothering her. Since discharge she has been doing okay, no confusion per husband. She has not been taking lactulose when out of the house and can miss doses if she leaves the house. When she is takes lactulose she will have loose stools, a few times per day. Swelling in the abdomen is chronic and stable.  No fevers. She has a history of anxiety and on Xanax, she has not been taking this recently. Husband reports she is sleeping well. She is prescribed Elavil to take PRN previously but she is not taking it recently.   In  regards to her medication regimen, she is takinglasix 37m BID 5 days per week, and 447m Daily on 2 days per week. She is also on spironolactone 7535maily. This is the dosing she was placed on after hospitalizations for electrolyte abnormalities on different dosing, but patient and husband were not clear what prior dosing she was on.  She had been on nadolol 35m69mily for esophageal varices however given her COPD, this was changed to bisoprolol recently. She wears oxygen for COPD. She has been on this for 1.5 years. She is also taking protonix 35mg37m.  She does not drive. She has not been on Rifaximin previously.   Last Echo Jan 2017 - EF 55-60%  Most recent upper / lower endoscopy EGD 10/22/13 - grace 2 varices, no stricture, started on nadolol Colonoscopy 06/2008 - multiple colon polyps, - adenomas / tubulovillous adenoma   Past Medical History  Diagnosis Date  . Hx of shoulder replacement   . Hypertension   . Hyperlipidemia   . COPD (chronic obstructive pulmonary disease) (HCC) [place] Vitamin D deficiency   . IBS (irritable bowel syndrome)   . Fibromyalgia   . Hepatic cirrhosis (HCC) [place] Esophageal varices (HCC) [place] Hepatic encephalopathy (HCC)[facility]  Past Surgical History  Procedure Laterality Date  . Hip arthroplasty Bilateral   . Shoulder surgery Right   . Back surgery    . Cholecystectomy    . Neck surgery    . Esophagogastroduodenoscopy  07/16/2012    Procedure: ESOPHAGOGASTRODUODENOSCOPY (  EGD);  Surgeon: Inda Castle, MD;  Location: [facility];  Service: Endoscopy;  Laterality: N/A;  . Gastric varices banding  07/16/2012    Procedure: GASTRIC VARICES BANDING;  Surgeon: Inda Castle, MD;  Location: [facility];  Service: Endoscopy;  Laterality: N/A;   Family History  Problem Relation Age of Onset  . Colon cancer Neg Hx   . Diabetes Brother   . Heart disease Sister     A Fib  . Heart disease Mother     CHF   Social History  Substance Use Topics  .  Smoking status: Former Smoker -- 1.00 packs/day for 50 years    Types: Cigarettes    Quit date: 11/05/2014  . Smokeless tobacco: Never Used     Comment: Quit April 2016  . Alcohol Use: No   Current Outpatient Prescriptions  Medication Sig Dispense Refill  . allopurinol (ZYLOPRIM) 300 MG tablet 1/2-1 tablet daily for gout (Patient taking differently: Take 300 mg by mouth daily. ) 90 tablet 1  . aspirin 81 MG tablet Take 81 mg by mouth daily.    . benzonatate (TESSALON) 100 MG capsule Take 100 mg by mouth 3 (three) times daily as needed for cough.    . bisoprolol (ZEBETA) 10 MG tablet Take 1 tablet (10 mg total) by mouth daily. 30 tablet 11  . citalopram (CELEXA) 40 MG tablet Take 40 mg by mouth 2 (two) times daily.    . cyclobenzaprine (FLEXERIL) 10 MG tablet Take 1 tablet (10 mg total) by mouth 3 (three) times daily as needed for muscle spasms. 30 tablet 0  . ferrous sulfate 325 (65 FE) MG tablet TAKE 1 TABLET TWICE DAILY (Patient taking differently: TAKE 1 TABLET ONCE DAILY) 60 tablet 5  . furosemide (LASIX) 80 MG tablet Take 80 mg by mouth 2 (two) times daily. Patient takes 40 mg daily on Wednesday    . gabapentin (NEURONTIN) 300 MG capsule take 1 capsule by mouth in the morning, 1 capsule in the afternoon and 1-3 capsules at night (Patient taking differently: Take 300 mg by mouth 3 (three) times daily. ) 120 capsule 2  . ipratropium (ATROVENT) 0.02 % nebulizer solution use 1 vial in nebulizer EVERY 4 HOURS AS NEEDED FOR WHEEZING OR SHORTNESS OF BREATH 75 mL 3  . lactulose (CHRONULAC) 10 GM/15ML solution Take 30 ml (1 ounce) 2 x day 1892 mL 99  . Magnesium 250 MG TABS Take 250 mg by mouth daily at 6 (six) AM.    . metFORMIN (GLUCOPHAGE) 500 MG tablet Take 1,000 mg by mouth 2 (two) times daily.     . metolazone (ZAROXOLYN) 5 MG tablet Take 2.5 mg by mouth 2 (two) times a week. Wed and Sun    . ondansetron (ZOFRAN) 4 MG tablet Take 1 tablet (4 mg total) by mouth daily as needed for nausea or  vomiting. 30 tablet 1  . OXYGEN Inhale 3 L/min into the lungs daily as needed (On exertion and at home).     . pantoprazole (PROTONIX) 40 MG tablet Take 1 tablet (40 mg total) by mouth 2 (two) times daily. 180 tablet 1  . potassium chloride (K-DUR) 10 MEQ tablet Take 2 tablets (20 mEq total) by mouth daily. 60 tablet 6  . spironolactone (ALDACTONE) 25 MG tablet TAKE 1 TABLET BY MOUTH THREE TIMES DAILY FOR FLUIDS AND SWELLING (Patient taking differently: TAKE 1 TABLET BY MOUTH ONCE DAILY.) 90 tablet 1  . tamoxifen (NOLVADEX) 20 MG tablet Take 1  tablet (20 mg total) by mouth daily. 90 tablet 2  . traMADol (ULTRAM) 50 MG tablet Take 1 tablet (50 mg total) by mouth every 6 (six) hours as needed. (Patient taking differently: Take 50 mg by mouth every 6 (six) hours as needed for moderate pain. ) 90 tablet 0  . rifaximin (XIFAXAN) 550 MG TABS tablet Take 1 tablet (550 mg total) by mouth 2 (two) times daily. 60 tablet 3   No current facility-administered medications for this visit.   Allergies  Allergen Reactions  . Atorvastatin Other (See Comments)    Doesn't remember  . Diphenhydramine Hcl (Sleep) Hives  . Hydrocodone-Acetaminophen Other (See Comments)    Doesn't remember  . Lopid [Gemfibrozil] Other (See Comments)    Doesn't remember  . Loratadine Hives  . Lorazepam Hives  . Simvastatin Other (See Comments)  . Sulfamethoxazole Hives  . Sulfonamide Derivatives Hives     Review of Systems: All systems reviewed and negative except where noted in HPI.   Lab Results  Component Value Date   CREATININE 1.61* 10/31/2015   BUN 36* 10/31/2015   NA 139 10/31/2015   K 3.5 10/31/2015   CL 93* 10/31/2015   CO2 35* 10/31/2015    Lab Results  Component Value Date   ALT 34 10/31/2015   AST 34 10/31/2015   ALKPHOS 84 10/31/2015   BILITOT 1.1 10/31/2015    Lab Results  Component Value Date   WBC 9.8 10/31/2015   HGB 11.7* 10/31/2015   HCT 36.0 10/31/2015   MCV 102.9* 10/31/2015   PLT  89* 10/31/2015   Lab Results  Component Value Date   INR 1.10 10/12/2015   INR 1.11 09/22/2015   INR 1.08 08/08/2015     Physical Exam: BP 120/60 mmHg  Pulse 68  Ht   Wt  Constitutional: Pleasant, female in no acute distress., wearing oxygen HEENT: Normocephalic and atraumatic. Conjunctivae are normal. No scleral icterus. Neck supple.  Cardiovascular: Normal rate, regular rhythm.  Pulmonary/chest: decreased BS B, relatively clear otherwise Abdominal: Soft, distended, nontender. Bowel sounds active throughout. There are no masses palpable.  Extremities: trace to (+) 1 edema B, LE Lymphadenopathy: No cervical adenopathy noted. Neurological: Alert and oriented to person place and time. Mild asterixis Skin: Skin is warm and dry. No rashes noted. Psychiatric: Normal mood and affect. Behavior is normal.   ASSESSMENT AND PLAN: 70 y/o female with oxygen dependant COPD and comorbidities as above, here for follow up for decompensated cirrhosis which has been presumably due to NASH. MELD currently 12 (due to renal function). Multiple recent admissions over the last 3 months for a variety of issues, most recently due to suspected hepatic encephalopathy She responded well to lactulose, it appears this developed due to noncompliance. She states she has had ascites in the past but last imaging of the liver in October (MRI) and February ([place]) did not show ascites. Her abdomen is protuberant in general, hard to say if she has ascites on exam. She has previously had significant LE edema however none significant on exam today. She is on an unusual dose of diuretics, from what her husband says this has been adjusted following her multiple hospitalizations, she has had previous issues with dehydration and electrolyte abnormalities. She also has a history of nonbleeding esophageal varices.  She is new to me today, I reviewed her recent history with her and discussed cirrhosis, and long term risks of  complications at length. Unfortunately due to her other medical problems and her  age she is not a good transplant candidate. At this time I recommend the following interventions:  -start Rifaximin 550 mg BID for hepatic encephalopathy and continue lactulose given recent hospitalization for this. She should not be driving and has not been. -discontinue Xanax and Elavil, she should avoid these classes of medications if possible history of hepatic encephalopathy -CMP and INR today. She has had a rising creatinine over the past month and may need to decrease lasix. Will notify her of recommendations pending this lab -US abdomen with AFP for [place] screening, assess for ascites. She would warrant paracentesis to rule out SBP if ascites is noted -low sodium diet, provided counseling and handout about this -continue beta blocker for esophageal varices, regimen changed recently per Dr. Melvyn Novas given her COPD, nadolol was changed to bisoprolol. She is high risk for endoscopy given her pulmonary issues, will not survey varices at this time as she is being treated with beta blocker. In this light, do not think she is a good candidate for surveillance colonoscopy for history of colon polyps due to her medical issues -check hepatitis A and B ABs for immunity, she has been previously vaccinated, will see if she responded -continue PPI as prescribed, NO NSAIDs  She will f/u in 1 month for reassessment. We will contact her with results of labs and recommendations in the interim. If she has any concerns in the interim she will contact us.  Total > 50 minutes spent with patient and family in exam room.  [place] Cellar, MD [facility] Pager [phone]  CC: Unk Pinto, MD

## 2015-11-10 NOTE — Patient Instructions (Addendum)
Your physician has requested that you go to the basement for lab work before leaving today.  We have sent your demographic information and a prescription for Xifaxan to [facility]. This pharmacy is able to get medication approved through insurance and get you the lowest copay possible. If you have not heard from them within 1 week, please call our office at [phone] to let us know.  Please stop taking Elavil and Xanax.  You have been scheduled for an abdominal ultrasound at [facility] (1st floor of hospital) on 11/14/15 at 8:00am. Please arrive 15 minutes prior to your appointment for registration. Make certain not to have anything to eat or drink 6 hours prior to your appointment. Should you need to reschedule your appointment, please contact radiology at [phone]. This test typically takes about 30 minutes to perform.  Please remain on a low sodium diet.   Please follow up with Dr. Havery Moros on 12/15/15 at 9:45am.

## 2015-11-11 ENCOUNTER — Other Ambulatory Visit: Payer: Self-pay | Admitting: *Deleted

## 2015-11-11 DIAGNOSIS — K7460 Unspecified cirrhosis of liver: Secondary | ICD-10-CM

## 2015-11-11 LAB — HEPATITIS B SURFACE ANTIBODY,QUALITATIVE: HEP B S AB: NEGATIVE

## 2015-11-11 LAB — AFP TUMOR MARKER: AFP-Tumor Marker: 5.9 ng/mL (ref <6.1)

## 2015-11-11 LAB — HEPATITIS A ANTIBODY, TOTAL: Hep A Total Ab: REACTIVE — AB

## 2015-11-11 NOTE — Telephone Encounter (Signed)
Patient's insurance, [employer]	[place] has approved Xifaxan from 11/10/15-05/08/16.

## 2015-11-11 NOTE — Telephone Encounter (Signed)
Noted  

## 2015-11-14 ENCOUNTER — Other Ambulatory Visit: Payer: Self-pay

## 2015-11-14 ENCOUNTER — Ambulatory Visit (HOSPITAL_COMMUNITY)
Admission: RE | Admit: 2015-11-14 | Discharge: 2015-11-14 | Disposition: A | Discharge status: Home / Self Care | Payer: PPO | Source: Ambulatory Visit | Attending: Gastroenterology | Admitting: Gastroenterology

## 2015-11-14 ENCOUNTER — Ambulatory Visit (INDEPENDENT_AMBULATORY_CARE_PROVIDER_SITE_OTHER): Payer: PPO | Admitting: Internal Medicine

## 2015-11-14 VITALS — BP 126/68 | HR 88 | Temp 97.9°F | Resp 18 | Wt 175.0 lb

## 2015-11-14 DIAGNOSIS — I8510 Secondary esophageal varices without bleeding: Secondary | ICD-10-CM

## 2015-11-14 DIAGNOSIS — K7469 Other cirrhosis of liver: Secondary | ICD-10-CM

## 2015-11-14 DIAGNOSIS — M797 Fibromyalgia: Secondary | ICD-10-CM

## 2015-11-14 DIAGNOSIS — D696 Thrombocytopenia, unspecified: Secondary | ICD-10-CM

## 2015-11-14 DIAGNOSIS — I10 Essential (primary) hypertension: Secondary | ICD-10-CM

## 2015-11-14 DIAGNOSIS — E1129 Type 2 diabetes mellitus with other diabetic kidney complication: Secondary | ICD-10-CM | POA: Diagnosis not present

## 2015-11-14 DIAGNOSIS — I8500 Esophageal varices without bleeding: Secondary | ICD-10-CM | POA: Insufficient documentation

## 2015-11-14 DIAGNOSIS — E559 Vitamin D deficiency, unspecified: Secondary | ICD-10-CM

## 2015-11-14 DIAGNOSIS — K7460 Unspecified cirrhosis of liver: Secondary | ICD-10-CM | POA: Diagnosis not present

## 2015-11-14 DIAGNOSIS — E785 Hyperlipidemia, unspecified: Secondary | ICD-10-CM | POA: Diagnosis not present

## 2015-11-14 DIAGNOSIS — J441 Chronic obstructive pulmonary disease with (acute) exacerbation: Secondary | ICD-10-CM | POA: Diagnosis not present

## 2015-11-14 DIAGNOSIS — R296 Repeated falls: Secondary | ICD-10-CM

## 2015-11-14 DIAGNOSIS — K209 Esophagitis, unspecified without bleeding: Secondary | ICD-10-CM

## 2015-11-14 DIAGNOSIS — K7290 Hepatic failure, unspecified without coma: Secondary | ICD-10-CM | POA: Diagnosis not present

## 2015-11-14 DIAGNOSIS — K2100 Gastro-esophageal reflux disease with esophagitis, without bleeding: Secondary | ICD-10-CM

## 2015-11-14 DIAGNOSIS — J418 Mixed simple and mucopurulent chronic bronchitis: Secondary | ICD-10-CM

## 2015-11-14 DIAGNOSIS — R161 Splenomegaly, not elsewhere classified: Secondary | ICD-10-CM | POA: Insufficient documentation

## 2015-11-14 DIAGNOSIS — R6889 Other general symptoms and signs: Secondary | ICD-10-CM | POA: Diagnosis not present

## 2015-11-14 DIAGNOSIS — K5730 Diverticulosis of large intestine without perforation or abscess without bleeding: Secondary | ICD-10-CM

## 2015-11-14 DIAGNOSIS — E876 Hypokalemia: Secondary | ICD-10-CM

## 2015-11-14 DIAGNOSIS — E1122 Type 2 diabetes mellitus with diabetic chronic kidney disease: Secondary | ICD-10-CM

## 2015-11-14 DIAGNOSIS — Z0000 Encounter for general adult medical examination without abnormal findings: Secondary | ICD-10-CM

## 2015-11-14 DIAGNOSIS — I872 Venous insufficiency (chronic) (peripheral): Secondary | ICD-10-CM | POA: Diagnosis not present

## 2015-11-14 DIAGNOSIS — N183 Chronic kidney disease, stage 3 unspecified: Secondary | ICD-10-CM

## 2015-11-14 DIAGNOSIS — K7682 Hepatic encephalopathy: Secondary | ICD-10-CM

## 2015-11-14 DIAGNOSIS — Z9181 History of falling: Secondary | ICD-10-CM

## 2015-11-14 DIAGNOSIS — Z0001 Encounter for general adult medical examination with abnormal findings: Secondary | ICD-10-CM | POA: Diagnosis not present

## 2015-11-14 DIAGNOSIS — J9612 Chronic respiratory failure with hypercapnia: Secondary | ICD-10-CM

## 2015-11-14 DIAGNOSIS — J9611 Chronic respiratory failure with hypoxia: Secondary | ICD-10-CM | POA: Diagnosis not present

## 2015-11-14 DIAGNOSIS — E669 Obesity, unspecified: Secondary | ICD-10-CM

## 2015-11-14 DIAGNOSIS — J449 Chronic obstructive pulmonary disease, unspecified: Secondary | ICD-10-CM

## 2015-11-14 DIAGNOSIS — Z79899 Other long term (current) drug therapy: Secondary | ICD-10-CM | POA: Diagnosis not present

## 2015-11-14 DIAGNOSIS — K589 Irritable bowel syndrome without diarrhea: Secondary | ICD-10-CM

## 2015-11-14 DIAGNOSIS — D126 Benign neoplasm of colon, unspecified: Secondary | ICD-10-CM

## 2015-11-14 LAB — CBC WITH DIFFERENTIAL/PLATELET
BASOS ABS: 0 {cells}/uL (ref 0–200)
Basophils Relative: 0 %
EOS PCT: 6 %
Eosinophils Absolute: 342 cells/uL (ref 15–500)
HEMATOCRIT: 31.1 % — AB (ref 35.0–45.0)
HEMOGLOBIN: 10 g/dL — AB (ref 11.7–15.5)
LYMPHS ABS: 1026 {cells}/uL (ref 850–3900)
Lymphocytes Relative: 18 %
MCH: 32.7 pg (ref 27.0–33.0)
MCHC: 32.2 g/dL (ref 32.0–36.0)
MCV: 101.6 fL — ABNORMAL HIGH (ref 80.0–100.0)
MONO ABS: 171 {cells}/uL — AB (ref 200–950)
MPV: 11.4 fL (ref 7.5–12.5)
Monocytes Relative: 3 %
NEUTROS ABS: 4161 {cells}/uL (ref 1500–7800)
NEUTROS PCT: 73 %
Platelets: 111 10*3/uL — ABNORMAL LOW (ref 140–400)
RBC: 3.06 MIL/uL — AB (ref 3.80–5.10)
RDW: 15.7 % — ABNORMAL HIGH (ref 11.0–15.0)
WBC: 5.7 10*3/uL (ref 3.8–10.8)

## 2015-11-14 LAB — LIPID PANEL
CHOL/HDL RATIO: 5.7 ratio — AB (ref <=5.0)
Cholesterol: 176 mg/dL (ref 125–200)
HDL: 31 mg/dL — ABNORMAL LOW (ref >=46)
LDL CALC: 81 mg/dL (ref <130)
Triglycerides: 321 mg/dL — ABNORMAL HIGH (ref <150)
VLDL: 64 mg/dL — ABNORMAL HIGH (ref <30)

## 2015-11-14 LAB — HEMOGLOBIN A1C
HEMOGLOBIN A1C: 6.7 % — AB (ref <5.7)
MEAN PLASMA GLUCOSE: 146 mg/dL

## 2015-11-14 LAB — HEPATIC FUNCTION PANEL
ALK PHOS: 80 U/L (ref 33–130)
ALT: 20 U/L (ref 6–29)
AST: 23 U/L (ref 10–35)
Albumin: 3.4 g/dL — ABNORMAL LOW (ref 3.6–5.1)
BILIRUBIN DIRECT: 0.2 mg/dL (ref <=0.2)
BILIRUBIN INDIRECT: 0.4 mg/dL (ref 0.2–1.2)
TOTAL PROTEIN: 5.4 g/dL — AB (ref 6.1–8.1)
Total Bilirubin: 0.6 mg/dL (ref 0.2–1.2)

## 2015-11-14 LAB — BASIC METABOLIC PANEL WITH GFR
BUN: 27 mg/dL — ABNORMAL HIGH (ref 7–25)
CHLORIDE: 89 mmol/L — AB (ref 98–110)
CO2: 32 mmol/L — AB (ref 20–31)
Calcium: 9.5 mg/dL (ref 8.6–10.4)
Creat: 1.56 mg/dL — ABNORMAL HIGH (ref 0.50–0.99)
GFR, EST NON AFRICAN AMERICAN: 34 mL/min — AB (ref >=60)
GFR, Est African American: 39 mL/min — ABNORMAL LOW (ref >=60)
GLUCOSE: 267 mg/dL — AB (ref 65–99)
Potassium: 4.1 mmol/L (ref 3.5–5.3)
SODIUM: 134 mmol/L — AB (ref 135–146)

## 2015-11-14 LAB — MAGNESIUM: Magnesium: 2 mg/dL (ref 1.5–2.5)

## 2015-11-14 LAB — TSH: TSH: 0.88 m[IU]/L

## 2015-11-14 NOTE — Progress Notes (Signed)
MEDICARE ANNUAL WELLNESS VISIT AND FOLLOW UP  Assessment:    1. Essential hypertension -Well controlled - TSH -cont meds -diet and exercise -monitor at home  2. Mixed simple and mucopurulent chronic bronchitis ([place]) -followed by pulmonology  3. Type 2 diabetes mellitus with stage 3 chronic kidney disease, without long-term current use of insulin (HCC) -cont diet and exercise - Hemoglobin A1c  4. Other cirrhosis of liver (HCC) -monitored by GI -cont lactulose -cont xifaximin - AMMONIA  5. Hyperlipidemia -cont meds -diet and exercise - Lipid panel  6. Vitamin D deficiency -cont Vit D supplement  7. Medication management  - CBC with Differential/Platelet - BASIC METABOLIC PANEL WITH GFR - Hepatic function panel - Magnesium  8. Secondary esophageal varices without bleeding (HCC) -cont to monitor with GI  9. Venous insufficiency -cont lasix  10. COPD exacerbation ([place]) -followed by pulmonology  11. Chronic respiratory failure with hypoxia and hypercapnia (HCC) -cont O2 -followed by pulmonology  12. COPD GOLD II with min reversiblity  -followed by pulmonology  13. Benign neoplasm of colon, unspecified part of colon -cont screening colonoscopy -sees GI  14. Esophagitis -followed by GI  15. Diverticulosis of large intestine without hemorrhage -without current infection or hemorrhage -monitor on colonoscopy  16. GERD -cont meds  17. IBS -cont meds -followed by GI  18. Hepatic encephalopathy (HCC) -cont lactulose and xifaximin  19. Fibromyalgia -stop elavil per GI  20. CKD stage 3 due to type 2 diabetes mellitus (HCC) -cont meds -diet and exercise as tolerated  21. Obesity -cont diet and exercise  22. At high risk for falls -home health  23. Thrombocytopenia (HCC) -monitor with CBC  24. Hypokalemia -resolved -monitor BMET  25. Encounter for Medicare annual wellness exam -due next year     Over 30 minutes of exam, counseling,  chart review, and critical decision making was performed  Plan:   During the course of the visit the patient was educated and counseled about appropriate screening and preventive services including:    Pneumococcal vaccine   Influenza vaccine  Td vaccine  Prevnar 13  Screening electrocardiogram  Screening mammography  Bone densitometry screening  Colorectal cancer screening  Diabetes screening  Glaucoma screening  Nutrition counseling   Advanced directives: given info/requested copies  Conditions/risks identified: Diabetes is at goal, ACE/ARB therapy: No, Reason not on Ace Inhibitor/ARB therapy:  hypotension Urinary Incontinence is an issue: discussed non pharmacology and pharmacology options. Patient declined further treatment Fall risk: high- discussed PT, home fall assessment, medications.    Subjective:   [name] is a 70 y.o. female who presents for Medicare Annual Wellness Visit and 3 month follow up on hypertension, diabetes, hyperlipidemia, vitamin D def.  Date of last medicare wellness visit is unknown.   Her blood pressure has been controlled at home, today their BP is   She does not workout. She denies chest pain, shortness of breath, dizziness.  She is not on cholesterol medication and denies myalgias. Her cholesterol is not at goal. The cholesterol last visit was:   Lab Results  Component Value Date   CHOL 232* 07/22/2015   HDL 29* 07/22/2015   LDLCALC NOT CALC 07/22/2015   TRIG 460* 07/22/2015   CHOLHDL 8.0* 07/22/2015   She has not been working on diet and exercise for diabetes, and denies foot ulcerations, hyperglycemia, hypoglycemia , increased appetite, nausea, paresthesia of the feet, polydipsia, polyuria, visual disturbances, vomiting and weight loss. Last A1C in the office was:  Lab Results  Component Value Date   HGBA1C 5.9* 07/22/2015   Last GFR NonAA   Lab Results  Component Value Date   GFRNONAA 32* 10/31/2015   AA  Lab  Results  Component Value Date   GFRAA 37* 10/31/2015   Patient is on Vitamin D supplement. Lab Results  Component Value Date   VD25OH 53 04/15/2015      She recently saw Dr. Havery Moros who has ordered a workup for her NASH.  She had an ultrasound yesterday and was ordered to start xifaxamin which she hasn't started yet as they needed to special order it from the pharmacy.  She is also have hepatitis screening.  She is going to have the medication mailed to her.  He did recommend stopping the elavil.   They had also seen Dr. Melvyn Novas who changed naldalol to bisoprolol.  Her lung function is stable.  She did have repeat PFTs.    She reports that over the weekend she has been having some redness and some swelling.  She has been scratching at it.  She has not been elevating legs lately.  She has had some blistering.    Medication Review Current Outpatient Prescriptions on File Prior to Visit  Medication Sig Dispense Refill  . allopurinol (ZYLOPRIM) 300 MG tablet 1/2-1 tablet daily for gout (Patient taking differently: Take 300 mg by mouth daily. ) 90 tablet 1  . aspirin 81 MG tablet Take 81 mg by mouth daily.    . benzonatate (TESSALON) 100 MG capsule Take 100 mg by mouth 3 (three) times daily as needed for cough.    . bisoprolol (ZEBETA) 10 MG tablet Take 1 tablet (10 mg total) by mouth daily. 30 tablet 11  . citalopram (CELEXA) 40 MG tablet Take 40 mg by mouth 2 (two) times daily.    . cyclobenzaprine (FLEXERIL) 10 MG tablet Take 1 tablet (10 mg total) by mouth 3 (three) times daily as needed for muscle spasms. 30 tablet 0  . ferrous sulfate 325 (65 FE) MG tablet TAKE 1 TABLET TWICE DAILY (Patient taking differently: TAKE 1 TABLET ONCE DAILY) 60 tablet 5  . furosemide (LASIX) 80 MG tablet Take 80 mg by mouth 2 (two) times daily. Patient takes 40 mg daily on Wednesday    . gabapentin (NEURONTIN) 300 MG capsule take 1 capsule by mouth in the morning, 1 capsule in the afternoon and 1-3 capsules at  night (Patient taking differently: Take 300 mg by mouth 3 (three) times daily. ) 120 capsule 2  . ipratropium (ATROVENT) 0.02 % nebulizer solution use 1 vial in nebulizer EVERY 4 HOURS AS NEEDED FOR WHEEZING OR SHORTNESS OF BREATH 75 mL 3  . lactulose (CHRONULAC) 10 GM/15ML solution Take 30 ml (1 ounce) 2 x day 1892 mL 99  . Magnesium 250 MG TABS Take 250 mg by mouth daily at 6 (six) AM.    . metFORMIN (GLUCOPHAGE) 500 MG tablet Take 1,000 mg by mouth 2 (two) times daily.     . metolazone (ZAROXOLYN) 5 MG tablet Take 2.5 mg by mouth 2 (two) times a week. Wed and Sun    . ondansetron (ZOFRAN) 4 MG tablet Take 1 tablet (4 mg total) by mouth daily as needed for nausea or vomiting. 30 tablet 1  . OXYGEN Inhale 3 L/min into the lungs daily as needed (On exertion and at home).     . pantoprazole (PROTONIX) 40 MG tablet Take 1 tablet (40 mg total) by mouth 2 (two) times daily. 180 tablet  1  . potassium chloride (K-DUR) 10 MEQ tablet Take 2 tablets (20 mEq total) by mouth daily. 60 tablet 6  . rifaximin (XIFAXAN) 550 MG TABS tablet Take 1 tablet (550 mg total) by mouth 2 (two) times daily. 60 tablet 3  . spironolactone (ALDACTONE) 25 MG tablet TAKE 1 TABLET BY MOUTH THREE TIMES DAILY FOR FLUIDS AND SWELLING (Patient taking differently: TAKE 1 TABLET BY MOUTH ONCE DAILY.) 90 tablet 1  . tamoxifen (NOLVADEX) 20 MG tablet Take 1 tablet (20 mg total) by mouth daily. 90 tablet 2  . traMADol (ULTRAM) 50 MG tablet Take 1 tablet (50 mg total) by mouth every 6 (six) hours as needed. (Patient taking differently: Take 50 mg by mouth every 6 (six) hours as needed for moderate pain. ) 90 tablet 0   No current facility-administered medications on file prior to visit.    Current Problems (verified) Patient Active Problem List   Diagnosis Date Noted  . DM type 2 causing CKD stage 3 ([place]) 10/30/2015  . Hepatic encephalopathy ([place]) 09/21/2015  . Hypokalemia 09/21/2015  . COPD GOLD II with min reversiblity  08/30/2015   . CKD stage 3 due to type 2 diabetes mellitus ([place]) 08/16/2015  . Chronic respiratory failure with hypoxia and hypercapnia ([place]) 08/07/2015  . Thrombocytopenia ([place]) 08/07/2015  . Venous insufficiency 07/26/2015  . Encounter for Medicare annual wellness exam 02/16/2015  . At high risk for falls 01/13/2015  . Obesity 09/21/2014  . Medication management 12/14/2013  . Essential hypertension   . Hyperlipidemia   . COPD (chronic obstructive pulmonary disease) ([place])   . GERD   . Vitamin D deficiency   . IBS   . Fibromyalgia   . Esophageal varices ([place]) 07/08/2012  . COLONIC POLYPS 11/11/2008  . Anemia 05/28/2008  . Hepatic cirrhosis ([place]) 05/28/2008  . Esophagitis 05/27/2008  . Diverticulosis of large intestine 05/27/2008    Screening Tests Immunization History  Administered Date(s) Administered  . Hep A / Hep B 11/05/2013, 11/12/2013, 12/07/2013, 12/31/2014  . Influenza Split 05/26/2013, 06/17/2014  . Influenza, High Dose Seasonal PF 06/15/2015  . Pneumococcal Conjugate-13 07/22/2015  . Pneumococcal Polysaccharide-23 08/28/2013  . Tdap 11/19/2012    Preventative care: Last colonoscopy: 2015 Last mammogram: 2012 per epic chart review, is followed by Dr. Lindi Adie for breast cancer history  DEXA:2016  Prior vaccinations: TD or Tdap: 2014  Influenza: 2016  Pneumococcal: 2015 Prevnar13: 2016 Shingles/Zostavax: Declines, not indicated due to other chronic conditions  Names of Other Physician/Practitioners you currently use: 1. [facility]- here for primary care 2. Dr. Patrici Ranks, eye doctor, last visit several years ago 22. Dr. Johnella Moloney, dentist, last visit 2016 Patient Care Team: Unk Pinto, MD as PCP - General (Internal Medicine) Inda Castle, MD as Consulting Physician (Gastroenterology) Izora Gala, MD as Consulting Physician (Otolaryngology)  Past Surgical History  Procedure Laterality Date  . Hip arthroplasty Bilateral   .  Shoulder surgery Right   . Back surgery    . Cholecystectomy    . Neck surgery    . Esophagogastroduodenoscopy  07/16/2012    Procedure: ESOPHAGOGASTRODUODENOSCOPY (EGD);  Surgeon: Inda Castle, MD;  Location: [facility];  Service: Endoscopy;  Laterality: N/A;  . Gastric varices banding  07/16/2012    Procedure: GASTRIC VARICES BANDING;  Surgeon: Inda Castle, MD;  Location: [facility];  Service: Endoscopy;  Laterality: N/A;   Family History  Problem Relation Age of Onset  . Colon cancer Neg Hx   .  Diabetes Brother   . Heart disease Sister     A Fib  . Heart disease Mother     CHF   Social History  Substance Use Topics  . Smoking status: Former Smoker -- 1.00 packs/day for 50 years    Types: Cigarettes    Quit date: 11/05/2014  . Smokeless tobacco: Never Used     Comment: Quit April 2016  . Alcohol Use: No    MEDICARE WELLNESS OBJECTIVES: Tobacco use: She does not smoke.  Patient is not a former smoker. If yes, counseling given Alcohol Current alcohol use: none Osteoporosis: postmenopausal estrogen deficiency, History of fracture in the past year: no Fall risk: High Risk Hearing: normal Visual acuity: normal,  does not perform annual eye exam Diet: well balanced Physical activity: Current Exercise Habits: The patient does not participate in regular exercise at present, Exercise limited by: orthopedic condition(s);respiratory conditions(s);neurologic condition(s) Cardiac risk factors: Cardiac Risk Factors include: advanced age (>80mn, >>56women);diabetes mellitus;dyslipidemia;hypertension;family history of premature cardiovascular disease;sedentary lifestyle Depression/mood screen:   Depression screen [facility] 11/14/2015  Decreased Interest 0  Down, Depressed, Hopeless 1  PHQ - 2 Score 1  Altered sleeping 0  Tired, decreased energy 2  Change in appetite 0  Feeling bad or failure about yourself  0  Trouble concentrating 0  Moving slowly or fidgety/restless 0   Suicidal thoughts 0  PHQ-9 Score 3    ADLs:  In your present state of health, do you have any difficulty performing the following activities: 11/14/2015 10/29/2015  Hearing? N Y  Vision? N N  Difficulty concentrating or making decisions? Y N  Walking or climbing stairs? Y Y  Dressing or bathing? Y Y  Doing errands, shopping? YTempie Donning Preparing Food and eating ? Y -  Using the Toilet? Y -  In the past six months, have you accidently leaked urine? Y -  Do you have problems with loss of bowel control? Y -  Managing your Medications? Y -  Managing your Finances? N -  Housekeeping or managing your Housekeeping? Y -     Cognitive Testing  Alert? Yes  Normal Appearance?Yes  Oriented to person? Yes  Place? Yes   Time? Yes  Recall of three objects?  Yes  Can perform simple calculations? Yes  Displays appropriate judgment?Yes  Can read the correct time from a watch face?Yes  EOL planning: Does patient have an advance directive?: No Would patient like information on creating an advanced directive?: Yes - Educational materials given   Objective:   There were no vitals filed for this visit. There is no weight on file to calculate BMI.  General appearance: alert, no distress, WD/WN,  female HEENT: normocephalic, sclerae anicteric, TMs pearly, nares patent, no discharge or erythema, pharynx normal Oral cavity: MMM, no lesions Neck: supple, no lymphadenopathy, no thyromegaly, no masses Heart: RRR, normal S1, S2, no murmurs Lungs: CTA bilaterally, no wheezes, rhonchi, or rales Abdomen: +bs, soft, non tender, non distended, no masses, no hepatomegaly, no splenomegaly Musculoskeletal: nontender, no swelling, no obvious deformity Extremities: no edema, no cyanosis, no clubbing Pulses: 2+ symmetric, upper and lower extremities, normal cap refill Neurological: alert, oriented x 3, CN2-12 intact, strength normal upper extremities and lower extremities, sensation normal throughout, DTRs 2+  throughout, no cerebellar signs, gait normal Psychiatric: normal affect, behavior normal, pleasant  Breast: defer Gyn: defer Rectal: defer   Medicare Attestation I have personally reviewed: The patient's medical and social history Their use of alcohol, tobacco or  illicit drugs Their current medications and supplements The patient's functional ability including ADLs,fall risks, home safety risks, cognitive, and hearing and visual impairment Diet and physical activities Evidence for depression or mood disorders  The patient's weight, height, BMI, and visual acuity have been recorded in the chart.  I have made referrals, counseling, and provided education to the patient based on review of the above and I have provided the patient with a written personalized care plan for preventive services.     Starlyn Skeans, PA-C   11/14/2015

## 2015-11-14 NOTE — Patient Instructions (Signed)
Please take 1 100 mg tablet daily of the doxycycline tablets daily with food.  Continue to use the anti nausea medication before meals.  Take lactulose either with meals or after meals.    Please make sure you get all your doses of the lactulose in daily to have 2-3 soft bowel movements per day.    Call if you have any issues.  Stop the elavil three times a day.

## 2015-11-15 ENCOUNTER — Other Ambulatory Visit: Payer: Self-pay | Admitting: *Deleted

## 2015-11-15 LAB — AMMONIA: Ammonia: 79 umol/L — ABNORMAL HIGH (ref 16–53)

## 2015-11-15 MED ORDER — TAMOXIFEN CITRATE 20 MG PO TABS: ORAL_TABLET | ORAL | Status: DC

## 2015-11-15 MED ORDER — SPIRONOLACTONE 25 MG PO TABS: ORAL_TABLET | ORAL | Status: DC

## 2015-11-17 ENCOUNTER — Telehealth: Payer: Self-pay | Admitting: *Deleted

## 2015-11-17 ENCOUNTER — Telehealth: Payer: Self-pay | Admitting: Gastroenterology

## 2015-11-17 ENCOUNTER — Other Ambulatory Visit (INDEPENDENT_AMBULATORY_CARE_PROVIDER_SITE_OTHER): Payer: PPO

## 2015-11-17 ENCOUNTER — Ambulatory Visit (INDEPENDENT_AMBULATORY_CARE_PROVIDER_SITE_OTHER): Payer: PPO | Admitting: Gastroenterology

## 2015-11-17 DIAGNOSIS — R2689 Other abnormalities of gait and mobility: Secondary | ICD-10-CM | POA: Diagnosis not present

## 2015-11-17 DIAGNOSIS — J9611 Chronic respiratory failure with hypoxia: Secondary | ICD-10-CM | POA: Diagnosis not present

## 2015-11-17 DIAGNOSIS — K7460 Unspecified cirrhosis of liver: Secondary | ICD-10-CM | POA: Diagnosis not present

## 2015-11-17 DIAGNOSIS — N183 Chronic kidney disease, stage 3 (moderate): Secondary | ICD-10-CM | POA: Diagnosis not present

## 2015-11-17 DIAGNOSIS — K7469 Other cirrhosis of liver: Secondary | ICD-10-CM | POA: Diagnosis not present

## 2015-11-17 DIAGNOSIS — Z23 Encounter for immunization: Secondary | ICD-10-CM

## 2015-11-17 DIAGNOSIS — Z7984 Long term (current) use of oral hypoglycemic drugs: Secondary | ICD-10-CM | POA: Diagnosis not present

## 2015-11-17 DIAGNOSIS — J449 Chronic obstructive pulmonary disease, unspecified: Secondary | ICD-10-CM | POA: Diagnosis not present

## 2015-11-17 DIAGNOSIS — Z9181 History of falling: Secondary | ICD-10-CM | POA: Diagnosis not present

## 2015-11-17 DIAGNOSIS — I872 Venous insufficiency (chronic) (peripheral): Secondary | ICD-10-CM | POA: Diagnosis not present

## 2015-11-17 DIAGNOSIS — Z9981 Dependence on supplemental oxygen: Secondary | ICD-10-CM | POA: Diagnosis not present

## 2015-11-17 DIAGNOSIS — E1122 Type 2 diabetes mellitus with diabetic chronic kidney disease: Secondary | ICD-10-CM | POA: Diagnosis not present

## 2015-11-17 DIAGNOSIS — I129 Hypertensive chronic kidney disease with stage 1 through stage 4 chronic kidney disease, or unspecified chronic kidney disease: Secondary | ICD-10-CM | POA: Diagnosis not present

## 2015-11-17 LAB — BASIC METABOLIC PANEL
BUN: 24 mg/dL — ABNORMAL HIGH (ref 6–23)
CALCIUM: 9.4 mg/dL (ref 8.4–10.5)
CO2: 33 meq/L — AB (ref 19–32)
CREATININE: 1.41 mg/dL — AB (ref 0.40–1.20)
Chloride: 93 mEq/L — ABNORMAL LOW (ref 96–112)
GFR: 39.3 mL/min — ABNORMAL LOW (ref >60.00)
Glucose, Bld: 170 mg/dL — ABNORMAL HIGH (ref 70–99)
Potassium: 3.8 mEq/L (ref 3.5–5.1)
Sodium: 134 mEq/L — ABNORMAL LOW (ref 135–145)

## 2015-11-17 NOTE — Telephone Encounter (Signed)
Spoke with Sheppard Coil at [employer] which informed me that he is going to fax a form to the patient and our office to fill out for patient to possibly get the medication free through [facility] depending on patient's financial status. This process could take up to two weeks depending how fast the patient fills out the form. I asked if Sheppard Coil could have the patient contact our office if he is about to run out of medication so that we can try to get samples for the patient if need be. Sheppard Coil states he will go over all of this with the patient and will fax the form to [place] as soon as the patient fills out there part of the form.

## 2015-11-17 NOTE — Progress Notes (Signed)
Patient here for hepatitis B injection. Tolerated injection without difficulty.

## 2015-11-17 NOTE — Telephone Encounter (Signed)
Physical therapy called and asked about possible med interactions.  After reviewing the patient's list of meds, OK to continue with the meds as listed per Christell Faith.

## 2015-11-18 DIAGNOSIS — J449 Chronic obstructive pulmonary disease, unspecified: Secondary | ICD-10-CM | POA: Diagnosis not present

## 2015-11-21 ENCOUNTER — Other Ambulatory Visit: Payer: Self-pay | Admitting: *Deleted

## 2015-11-21 DIAGNOSIS — K7469 Other cirrhosis of liver: Secondary | ICD-10-CM

## 2015-11-28 ENCOUNTER — Other Ambulatory Visit: Payer: Self-pay | Admitting: *Deleted

## 2015-11-28 MED ORDER — TRAMADOL HCL 50 MG PO TABS: 50.0000 mg | ORAL_TABLET | Freq: Three times a day (TID) | ORAL | Status: DC | PRN

## 2015-11-29 ENCOUNTER — Other Ambulatory Visit: Payer: Self-pay | Admitting: Internal Medicine

## 2015-11-30 ENCOUNTER — Ambulatory Visit: Payer: Self-pay

## 2015-12-01 ENCOUNTER — Telehealth: Payer: Self-pay | Admitting: *Deleted

## 2015-12-01 ENCOUNTER — Other Ambulatory Visit: Payer: Self-pay

## 2015-12-01 NOTE — Telephone Encounter (Signed)
-----   Message from Hulan Saas, RN sent at 11/21/2015  8:59 AM EDT ----- Call and remind due for BMP on 12/05/15 SA

## 2015-12-01 NOTE — Patient Outreach (Signed)
[facility]) Care Management  12/01/2015  [name] 06/20/1946 [id number]   Telephone Screen  Referral Date:  11/24/2015 Source:  [employer]  Issue: Disease and symptom management.  Admissions: 3.  Last d/c date 10/31/2015.   Outreach call #1 to patient.   Patient's husband reached and confirmed patient is able to talk on the phone but is busy right now.   RN CM left HIPAA compliant message with name and number.  RN CM advised will schedule for call back within one week.   Mariann Laster, RN, BSN, [facility], CCM  [employer] Management Coordinator [phone] Direct [phone] Cell [phone] Office [phone] Fax

## 2015-12-01 NOTE — Telephone Encounter (Signed)
Patient husband will have patient come for labs.

## 2015-12-06 ENCOUNTER — Other Ambulatory Visit (INDEPENDENT_AMBULATORY_CARE_PROVIDER_SITE_OTHER): Payer: PPO

## 2015-12-06 ENCOUNTER — Other Ambulatory Visit: Payer: Self-pay

## 2015-12-06 DIAGNOSIS — K7469 Other cirrhosis of liver: Secondary | ICD-10-CM | POA: Diagnosis not present

## 2015-12-06 LAB — BASIC METABOLIC PANEL
BUN: 27 mg/dL — AB (ref 6–23)
CO2: 34 mEq/L — ABNORMAL HIGH (ref 19–32)
CREATININE: 1.53 mg/dL — AB (ref 0.40–1.20)
Calcium: 10 mg/dL (ref 8.4–10.5)
Chloride: 91 mEq/L — ABNORMAL LOW (ref 96–112)
GFR: 35.76 mL/min — ABNORMAL LOW (ref >60.00)
GLUCOSE: 137 mg/dL — AB (ref 70–99)
POTASSIUM: 3.8 meq/L (ref 3.5–5.1)
Sodium: 135 mEq/L (ref 135–145)

## 2015-12-06 NOTE — Patient Outreach (Signed)
[facility]) Care Management  12/06/2015  KATHRYN COSBY Oct 23, 1945 [id number]   Telephone Screen  Referral Date: 11/24/2015 Source: [employer]  Issue: Disease and symptom management. Admissions: 3. Last discharge date 10/31/2015.  H/o Discharge Diagnoses:  Hepatic Encephalopathy secondary to [facility] cirrhosis Essential hypertension COPD GERD Chronic diastolic heart failure Diabetes mellitus, type II   Chronic kidney disease stage III Depression/anxiety Thrombocytopenia  Outreach call #2 to patient.Patient reached and initiated call but ask RN CM to complete call with husband as he manages her healthcare needs and can answer questions more easily.  Patient gave consent to discuss all PHI and complete call with husband.     Providers: Primary MD: Dr. Melford Aase -  last appt:  About 1 1/2 weeks ago    next appt:  12/26/15 Pulmonologist: yes Cardiologist:  In the past  HH: PT services with provider:  [facility]  Insurance: [employer]  Social: Patient lives in her home with her caregiver/husband.   Mobility: Ambulates with walker.  Poor balance post last 10/31/15 discharge and PT ordered; has 2 appt's left / has completed 6.  Husband states improvement in balance noted.  Falls: last fall 09/2015 due to weakness associated with PNA.  Pain: none  Depression: none  Transportation: yes / husband  Caregiver: husband  Consent:  No.  Husband states they have no needs and no education needs.  States HH PT is active, keeping MD appt's and has someone calling them from [employer] to check in on them.   DME: walker, home oxygen ([facility]), CBG meter.   H/o multiple Co-morbidities:   ER visits: 1 and Admissions: 3 over past 6 months.  Hepatic Encephalopathy secondary to [facility] cirrhosis Essential hypertension COPD GERD Chronic diastolic heart failure Diabetes mellitus, type II - (A1C: 6.7 on 11/14/15) Chronic kidney disease stage  III Depression/anxiety Thrombocytopenia  Weight 175 BP 126/68  Objective:   Medications:  Patient taking  less than 15 medications  Co-pay cost issues: no  Flu Vaccine: 06/15/15 Pneumonia Vaccine:  PCV13 07/22/15 and Pneumovax 08/28/2013  Encounter Medications:  Outpatient Encounter Prescriptions as of 12/06/2015  Medication Sig  . allopurinol (ZYLOPRIM) 300 MG tablet 1/2-1 tablet daily for gout (Patient taking differently: Take 300 mg by mouth daily. )  . aspirin 81 MG tablet Take 81 mg by mouth daily.  . benzonatate (TESSALON) 100 MG capsule Take 100 mg by mouth 3 (three) times daily as needed for cough.  . bisoprolol (ZEBETA) 10 MG tablet Take 1 tablet (10 mg total) by mouth daily.  . citalopram (CELEXA) 40 MG tablet Take 40 mg by mouth 2 (two) times daily.  . cyclobenzaprine (FLEXERIL) 10 MG tablet Take 1 tablet (10 mg total) by mouth 3 (three) times daily as needed for muscle spasms.  . ferrous sulfate 325 (65 FE) MG tablet TAKE 1 TABLET TWICE DAILY (Patient taking differently: TAKE 1 TABLET ONCE DAILY)  . furosemide (LASIX) 80 MG tablet Take 80 mg by mouth 2 (two) times daily. Patient takes 40 mg daily on Wednesday  . gabapentin (NEURONTIN) 300 MG capsule take 1 capsule by mouth in the morning, 1 capsule in the afternoon and 1-3 capsules at night (Patient taking differently: Take 300 mg by mouth 3 (three) times daily. )  . ipratropium (ATROVENT) 0.02 % nebulizer solution use 1 vial in nebulizer EVERY 4 HOURS AS NEEDED FOR WHEEZING OR SHORTNESS OF BREATH  . lactulose (CHRONULAC) 10 GM/15ML solution Take 30 ml (1 ounce) 2 x day  .  Magnesium 250 MG TABS Take 250 mg by mouth daily at 6 (six) AM.  . metFORMIN (GLUCOPHAGE) 500 MG tablet Take 1,000 mg by mouth 2 (two) times daily.   . metolazone (ZAROXOLYN) 5 MG tablet Take 2.5 mg by mouth 2 (two) times a week. Wed and Sun  . ondansetron (ZOFRAN) 4 MG tablet Take 1 tablet (4 mg total) by mouth daily as needed for nausea or vomiting.  .  OXYGEN Inhale 3 L/min into the lungs daily as needed (On exertion and at home).   . pantoprazole (PROTONIX) 40 MG tablet Take 1 tablet (40 mg total) by mouth 2 (two) times daily.  . potassium chloride (K-DUR) 10 MEQ tablet Take 2 tablets (20 mEq total) by mouth daily.  . rifaximin (XIFAXAN) 550 MG TABS tablet Take 1 tablet (550 mg total) by mouth 2 (two) times daily.  Marland Kitchen spironolactone (ALDACTONE) 25 MG tablet TAKE 1 TABLET BY MOUTH THREE TIMES DAILY FOR FLUIDS AND SWELLING  . tamoxifen (NOLVADEX) 20 MG tablet Take 1 tablet (20 mg total) by mouth daily.  . traMADol (ULTRAM) 50 MG tablet Take 1 tablet (50 mg total) by mouth every 8 (eight) hours as needed for moderate pain.   No facility-administered encounter medications on file as of 12/06/2015.    Functional Status:  In your present state of health, do you have any difficulty performing the following activities: 12/06/2015 11/14/2015  Hearing? N N  Vision? N N  Difficulty concentrating or making decisions? Tempie Donning  Walking or climbing stairs? Y Y  Dressing or bathing? N Y  Doing errands, shopping? Tempie Donning  Preparing Food and eating ? N Y  Using the Toilet? N Y  In the past six months, have you accidently leaked urine? N Y  Do you have problems with loss of bowel control? N Y  Managing your Medications? Y Y  Managing your Finances? Y N  Housekeeping or managing your Housekeeping? Tempie Donning    Fall/Depression Screening: PHQ 2/9 Scores 12/06/2015 11/14/2015 10/12/2015 09/27/2015 01/13/2015  PHQ - 2 Score 0 1 4 1  0  PHQ- 9 Score - 3 15 - -   Fall Risk  12/06/2015 11/14/2015 10/12/2015 10/06/2015 09/27/2015  Falls in the past year? Yes Yes Yes - Yes  Number falls in past yr: 1 2 or more 2 or more - 2 or more  Injury with Fall? No No No - Yes  Risk Factor Category  - High Fall Risk High Fall Risk - High Fall Risk  Risk for fall due to : History of fall(s);Impaired balance/gait;Impaired mobility History of fall(s);Impaired balance/gait;Impaired mobility;Medication side  effect;Mental status change History of fall(s);Impaired balance/gait;Impaired mobility Impaired mobility History of fall(s);Impaired balance/gait;Impaired mobility;Impaired vision;Medication side effect  Risk for fall due to (comments): uses a walker.  - - - -  Follow up Falls prevention discussed;Falls evaluation completed Falls evaluation completed Falls evaluation completed - Follow up appointment   Assessment / Intervention / Plan:  Case Closed:   -Assessed but patient and husband declined services stating no needs.   -[facility] Management Assistant notified.  RN CM advised to contact [facility] should questions or concerns arise.   RN CM sent case closure letter to patient.  RN CM sent Physician Enrollment/Barriers Letter and Initial Assessment to Primary MD  RN CM advised to please notify MD of any changes in condition prior to scheduled appt's.   RN CM provided contact name and # [phone] or main office # [phone] and 24-hour nurse line #  [phone].  RN CM confirmed patient is aware of 911 services for urgent emergency needs.    Mariann Laster, RN, BSN, [facility], CCM  [employer] Management Coordinator [phone] Direct [phone] Cell [phone] Office [phone] Fax

## 2015-12-07 DIAGNOSIS — I129 Hypertensive chronic kidney disease with stage 1 through stage 4 chronic kidney disease, or unspecified chronic kidney disease: Secondary | ICD-10-CM | POA: Diagnosis not present

## 2015-12-07 DIAGNOSIS — I872 Venous insufficiency (chronic) (peripheral): Secondary | ICD-10-CM | POA: Diagnosis not present

## 2015-12-07 DIAGNOSIS — E1122 Type 2 diabetes mellitus with diabetic chronic kidney disease: Secondary | ICD-10-CM | POA: Diagnosis not present

## 2015-12-07 DIAGNOSIS — R2689 Other abnormalities of gait and mobility: Secondary | ICD-10-CM | POA: Diagnosis not present

## 2015-12-07 DIAGNOSIS — Z9981 Dependence on supplemental oxygen: Secondary | ICD-10-CM | POA: Diagnosis not present

## 2015-12-07 DIAGNOSIS — N183 Chronic kidney disease, stage 3 (moderate): Secondary | ICD-10-CM | POA: Diagnosis not present

## 2015-12-07 DIAGNOSIS — J449 Chronic obstructive pulmonary disease, unspecified: Secondary | ICD-10-CM | POA: Diagnosis not present

## 2015-12-07 DIAGNOSIS — Z7984 Long term (current) use of oral hypoglycemic drugs: Secondary | ICD-10-CM | POA: Diagnosis not present

## 2015-12-07 DIAGNOSIS — Z9181 History of falling: Secondary | ICD-10-CM | POA: Diagnosis not present

## 2015-12-07 DIAGNOSIS — J9611 Chronic respiratory failure with hypoxia: Secondary | ICD-10-CM | POA: Diagnosis not present

## 2015-12-08 ENCOUNTER — Encounter: Payer: Self-pay | Admitting: *Deleted

## 2015-12-12 ENCOUNTER — Other Ambulatory Visit: Payer: Self-pay | Admitting: *Deleted

## 2015-12-12 MED ORDER — PANTOPRAZOLE SODIUM 40 MG PO TBEC: DELAYED_RELEASE_TABLET | ORAL | Status: DC

## 2015-12-13 DIAGNOSIS — I872 Venous insufficiency (chronic) (peripheral): Secondary | ICD-10-CM | POA: Diagnosis not present

## 2015-12-13 DIAGNOSIS — J9611 Chronic respiratory failure with hypoxia: Secondary | ICD-10-CM | POA: Diagnosis not present

## 2015-12-13 DIAGNOSIS — Z7984 Long term (current) use of oral hypoglycemic drugs: Secondary | ICD-10-CM | POA: Diagnosis not present

## 2015-12-13 DIAGNOSIS — N183 Chronic kidney disease, stage 3 (moderate): Secondary | ICD-10-CM | POA: Diagnosis not present

## 2015-12-13 DIAGNOSIS — J449 Chronic obstructive pulmonary disease, unspecified: Secondary | ICD-10-CM | POA: Diagnosis not present

## 2015-12-13 DIAGNOSIS — Z9181 History of falling: Secondary | ICD-10-CM | POA: Diagnosis not present

## 2015-12-13 DIAGNOSIS — I129 Hypertensive chronic kidney disease with stage 1 through stage 4 chronic kidney disease, or unspecified chronic kidney disease: Secondary | ICD-10-CM | POA: Diagnosis not present

## 2015-12-13 DIAGNOSIS — Z9981 Dependence on supplemental oxygen: Secondary | ICD-10-CM | POA: Diagnosis not present

## 2015-12-13 DIAGNOSIS — E1122 Type 2 diabetes mellitus with diabetic chronic kidney disease: Secondary | ICD-10-CM | POA: Diagnosis not present

## 2015-12-13 DIAGNOSIS — R2689 Other abnormalities of gait and mobility: Secondary | ICD-10-CM | POA: Diagnosis not present

## 2015-12-15 ENCOUNTER — Ambulatory Visit (INDEPENDENT_AMBULATORY_CARE_PROVIDER_SITE_OTHER): Payer: PPO | Admitting: Gastroenterology

## 2015-12-15 ENCOUNTER — Encounter: Payer: Self-pay | Admitting: Gastroenterology

## 2015-12-15 VITALS — BP 120/60 | HR 72 | Ht 62.0 in | Wt 177.1 lb

## 2015-12-15 DIAGNOSIS — L0390 Cellulitis, unspecified: Secondary | ICD-10-CM

## 2015-12-15 DIAGNOSIS — K7290 Hepatic failure, unspecified without coma: Secondary | ICD-10-CM | POA: Diagnosis not present

## 2015-12-15 DIAGNOSIS — K7460 Unspecified cirrhosis of liver: Secondary | ICD-10-CM | POA: Diagnosis not present

## 2015-12-15 DIAGNOSIS — I8510 Secondary esophageal varices without bleeding: Secondary | ICD-10-CM

## 2015-12-15 DIAGNOSIS — K7682 Hepatic encephalopathy: Secondary | ICD-10-CM

## 2015-12-15 MED ORDER — DOXYCYCLINE HYCLATE 100 MG PO TABS: 100.0000 mg | ORAL_TABLET | Freq: Two times a day (BID) | ORAL | Status: DC

## 2015-12-15 NOTE — Patient Instructions (Signed)
Please continue current medications.  We have sent the following medications to your pharmacy for you to pick up at your convenience: Doxycycline 100 mg twice daily x 10 days  Your physician has requested that you go to the basement for the following lab work before around 12/28/15: BMP  We will contact you once we get samples of xifaxan.  If you are age 69 or older, your body mass index should be between 23-30. Your Body mass index is 32.39 kg/(m^2). If this is out of the aforementioned range listed, please consider follow up with your Primary Care Provider.  If you are age 7 or younger, your body mass index should be between 19-25. Your Body mass index is 32.39 kg/(m^2). If this is out of the aformentioned range listed, please consider follow up with your Primary Care Provider.

## 2015-12-15 NOTE — Progress Notes (Signed)
HPI :  INTAKE HISTORY 70 y/o female here for follow up visit for her cirrhosis. Former patient of Dr. Deatra Ina, she is new to me.   In short this patient has decompesnated cirrhosis, thought to be related to NASH. Decompensated with history of hepatic encephalopathy and esophageal varices (no bleeding). She denies any history of alcohol use. She has been hospitalized 3 times since January for multiple medical problems. She has had electrolytes abnormalities and dehydration previously while on diuretics, also thought to have a component of adrenal insufficiency coming off of steroids, admitted in January. She was in February with weakness and again in later March with confusion and elevated ammonia levels, and concern for hepatic encephalopathy. She also had pnuemonia in February. She had not been taking lactulose prior to these hospitalizations. She was discharged from her last admission on the 27th of March. .  She was admitted for mental status changes and thought to be hepatic encephalopathy, admitted for 2 days. Prior to the hospitalization she had been taking lactulose, but husband does not think enough and she was missing doses. She quickly improved with lactulose when admitted and her mental status normalized per husband. She did not have had a prior paracentesis while admitted. She has had some ongoing abdominal distension thought to be due to ascites, and LE edema, but this is not currently bothering her. Since discharge she has been doing okay, no confusion per husband. She has not been taking lactulose when out of the house and can miss doses if she leaves the house. When she is takes lactulose she will have loose stools, a few times per day. Swelling in the abdomen is chronic and stable. No fevers. She has a history of anxiety and on Xanax, she has not been taking this recently. Husband reports she is sleeping well. She is prescribed Elavil to take PRN previously but she is not taking it  recently.   In regards to her medication regimen, she is takinglasix 68m BID 5 days per week, and 455mDaily on 2 days per week. She is also on spironolactone 7528maily. This is the dosing she was placed on after hospitalizations for electrolyte abnormalities on different dosing, but patient and husband were not clear what prior dosing she was on. She had been on nadolol 97m83mily for esophageal varices however given her COPD, this was changed to bisoprolol recently. She wears oxygen for COPD. She has been on this for 1.5 years. She is also taking protonix 97mg5m.  She does not drive. She has not been on Rifaximin previously.   Last Echo Jan 2017 - EF 55-60%  Most recent upper / lower endoscopy EGD 10/22/13 - grace 2 varices, no stricture, started on nadolol Colonoscopy 06/2008 - multiple colon polyps, - adenomas / tubulovillous adenoma  SINCE LAST VISIT:  She is here for follow up. She has been having cellulitis on the right lower leg for the past 3-4 days. She has been treated with doxycycline in the past which has helped this. The leg is tender and sore to the touch. She took some extra doxycycline at home and started taking it 3 days ago, took 100mg 77my, which husband says is getting better thus far. Edema better and overall improved but not resolved. She denies any fevers.   I had hoped to put her on Rifaximin at the last visit but she could not afford. We sent the paperwork to help with finances with this but husband states they don't want  to do it and it may increase the cost of other medications she has. Husband reports no active issues with hepatic encephalopathy. She has been taking lactulose at least once to twice daily. Husband has been titrated to keep loose stools. The lactulose can make her nauseated at times and uses Zofran to help with this.   She has stopped the Xanax and elavil since last visit as I requested  She is taking the bisoprolol medication every day and  tolerating it.   US liver done in April for [facility] screening which showed cirrhotic changes within the liver. No discrete mass is observed. Splenomegaly. No ascites.  She has also been back for vaccination for viral hepatitis.   She continues to take lasix daily. She had been on 64m BID, and we have since titrated to 882mAM and 4049mM for the past few days. She is also taking metolazone 2.5mg75mD. She is also taking aldactone 75mg42mly. The swelling in her legs generally has been well controlled on this regimen and they are happy with this. She has been on this regimen since hospitalization.      Past Medical History  Diagnosis Date  . Hx of shoulder replacement   . Hypertension   . Hyperlipidemia   . COPD (chronic obstructive pulmonary disease) (HCC) [place] Vitamin D deficiency   . IBS (irritable bowel syndrome)   . Fibromyalgia   . Hepatic cirrhosis (HCC) [place] Esophageal varices (HCC) [place] Hepatic encephalopathy (HCC) [place] Gastritis   . Esophageal varices (HCC) [place] Splenomegaly      Past Surgical History  Procedure Laterality Date  . Hip arthroplasty Bilateral   . Shoulder surgery Right   . Back surgery    . Cholecystectomy    . Neck surgery    . Esophagogastroduodenoscopy  07/16/2012    Procedure: ESOPHAGOGASTRODUODENOSCOPY (EGD);  Surgeon: RoberInda Castle  [facility];  Service: Endoscopy;  Laterality: N/A;  . Gastric varices banding  07/16/2012    Procedure: GASTRIC VARICES BANDING;  Surgeon: RoberInda Castle  Location: [facility];  Service: Endoscopy;  Laterality: N/A;   Family History  Problem Relation Age of Onset  . Colon cancer Neg Hx   . Diabetes Brother     deceased  . Heart disease Sister     A Fib  . Heart disease Mother     CHF  . Atrial fibrillation Sister    Social History  Substance Use Topics  . Smoking status: Former Smoker -- 1.00 packs/day for 50 years    Types: Cigarettes    Quit date: 11/05/2014  . Smokeless tobacco:  Never Used     Comment: Quit April 2016  . Alcohol Use: No   Current Outpatient Prescriptions  Medication Sig Dispense Refill  . allopurinol (ZYLOPRIM) 300 MG tablet 1/2-1 tablet daily for gout (Patient taking differently: Take 150 mg by mouth daily. ) 90 tablet 1  . aspirin 81 MG tablet Take 81 mg by mouth daily.    . benzonatate (TESSALON) 100 MG capsule Take 100 mg by mouth 3 (three) times daily as needed for cough.    . bisoprolol (ZEBETA) 10 MG tablet Take 1 tablet (10 mg total) by mouth daily. 30 tablet 11  . citalopram (CELEXA) 40 MG tablet Take 40 mg by mouth 2 (two) times daily.    . cyclobenzaprine (FLEXERIL) 10 MG tablet Take 1 tablet (10 mg total) by mouth 3 (  three) times daily as needed for muscle spasms. 30 tablet 0  . ferrous sulfate 325 (65 FE) MG tablet TAKE 1 TABLET TWICE DAILY (Patient taking differently: TAKE 1 TABLET ONCE DAILY) 60 tablet 5  . furosemide (LASIX) 80 MG tablet Take 80 mg by mouth 2 (two) times daily. Patient takes 40 mg daily on Wednesday    . gabapentin (NEURONTIN) 300 MG capsule take 1 capsule by mouth in the morning, 1 capsule in the afternoon and 1-3 capsules at night (Patient taking differently: Take 300 mg by mouth 3 (three) times daily. ) 120 capsule 2  . ipratropium (ATROVENT) 0.02 % nebulizer solution use 1 vial in nebulizer EVERY 4 HOURS AS NEEDED FOR WHEEZING OR SHORTNESS OF BREATH 75 mL 3  . lactulose (CHRONULAC) 10 GM/15ML solution Take 30 ml (1 ounce) 2 x day 1892 mL 99  . Magnesium 250 MG TABS Take 250 mg by mouth daily at 6 (six) AM.    . metFORMIN (GLUCOPHAGE) 500 MG tablet Take 1,000 mg by mouth 2 (two) times daily.     . metolazone (ZAROXOLYN) 5 MG tablet Take 2.5 mg by mouth 2 (two) times a week. Wed and Sun    . ondansetron (ZOFRAN) 4 MG tablet Take 1 tablet (4 mg total) by mouth daily as needed for nausea or vomiting. 30 tablet 1  . OXYGEN Inhale 3 L/min into the lungs daily as needed (On exertion and at home).     . pantoprazole  (PROTONIX) 40 MG tablet Take 1 tablet (40 mg total) by mouth 2 (two) times daily. 180 tablet 1  . potassium chloride (K-DUR) 10 MEQ tablet Take 2 tablets (20 mEq total) by mouth daily. 60 tablet 6  . rifaximin (XIFAXAN) 550 MG TABS tablet Take 1 tablet (550 mg total) by mouth 2 (two) times daily. 60 tablet 3  . spironolactone (ALDACTONE) 25 MG tablet TAKE 1 TABLET BY MOUTH THREE TIMES DAILY FOR FLUIDS AND SWELLING 90 tablet 1  . tamoxifen (NOLVADEX) 20 MG tablet Take 1 tablet (20 mg total) by mouth daily. 90 tablet 2  . traMADol (ULTRAM) 50 MG tablet Take 1 tablet (50 mg total) by mouth every 8 (eight) hours as needed for moderate pain. 90 tablet 1   No current facility-administered medications for this visit.   Allergies  Allergen Reactions  . Atorvastatin Other (See Comments)    Doesn't remember  . Diphenhydramine Hcl (Sleep) Hives  . Hydrocodone-Acetaminophen Other (See Comments)    Doesn't remember  . Lopid [Gemfibrozil] Other (See Comments)    Doesn't remember  . Loratadine Hives  . Lorazepam Hives  . Simvastatin Other (See Comments)  . Sulfamethoxazole Hives  . Sulfonamide Derivatives Hives     Review of Systems: All systems reviewed and negative except where noted in HPI.   Lab Results  Component Value Date   WBC 5.7 11/14/2015   HGB 10.0* 11/14/2015   HCT 31.1* 11/14/2015   MCV 101.6* 11/14/2015   PLT 111* 11/14/2015    Lab Results  Component Value Date   ALT 20 11/14/2015   AST 23 11/14/2015   ALKPHOS 80 11/14/2015   BILITOT 0.6 11/14/2015    Lab Results  Component Value Date   CREATININE 1.53* 12/06/2015   BUN 27* 12/06/2015   NA 135 12/06/2015   K 3.8 12/06/2015   CL 91* 12/06/2015   CO2 34* 12/06/2015   No results found for: AFP   Physical Exam: BP 120/60 mmHg  Pulse 72  Ht  5' 2"  (1.575 m)  Wt 177 lb 2 oz (80.343 kg)  BMI 32.39 kg/m2 Constitutional: Pleasant, female in no acute distress, wearing oxygen, in wheelchair HEENT: Normocephalic  and atraumatic. Conjunctivae are normal. No scleral icterus. Neck supple.  Cardiovascular: Normal rate, regular rhythm.  Pulmonary/chest: Effort normal and breath sounds mildly decreased at BL bases. No wheezing, rales or rhonchi. Abdominal: Soft, nondistended, nontender. Bowel sounds active throughout. Extremities: (+) 1 edema BL LE, cellulitis noted on anterior RLE with erythema noted that blanches to palpation, tenderness to palpation Neurological: Alert and oriented to person place and time. No asterixis Skin: Skin is warm and dry.Marland Kitchen Psychiatric: Normal mood and affect. Behavior is normal.   ASSESSMENT AND PLAN: 70 y/o female with oxygen dependant COPD and comorbidities as above, here for follow up for decompensated cirrhosis which has been presumably due to NASH. MELD currently 12 (due to renal function). Multiple recent admissions over the last 4 months for a variety of issues, most recently due to suspected hepatic encephalopathy. She responded well to lactulose, it appears this developed due to noncompliance. [place] screening up to date. No ascites on [place]. She has previously had significant LE edema however none significant on exam today. She is on a very unusual dose of diuretics, from what her husband says this has been adjusted following her multiple hospitalizations and they are hesitant to change it since it works, however I am concerned about her renal function over time, she has had previous issues with dehydration and electrolyte abnormalities. She also has a history of nonbleeding esophageal varices on beta blocker at this time. I again reviewed her recent history with her and discussed cirrhosis, and long term risks of complications at length. Unfortunately due to her other medical problems and her age she is not a good transplant candidate.   At this time I recommend the following interventions: -doxycycline 135m BID for 10 days for cellulitis (took for the past 3 days at 1036mdaily  and seems to be improving). If fevers or worsening she needs to seek re-evaluation -continue lactulose. When compliant this seems to work, and they agree to continue it given they have a hard time affording rifaximin at this time. I will try to get her some samples of rifaximin to take if she has any breakthrough while on lactulose. She should not be driving at all at this time.  -decrease lasix to 807m AM and 62m19mM. May likely stop metolazone, will await repeat BMP. They strongly wish to continue this regimen as above. Monitor LE edema closely -low sodium diet -continue HCC screening every 6 months -continue beta blocker for esophageal varices, regimen changed recently per Dr. WertMelvyn Novasen her COPD, nadolol was changed to bisoprolol. She is high risk for endoscopy given her pulmonary issues, will not survey varices at this time as she is being treated with beta blocker. In this light, do not think she is a good candidate for surveillance colonoscopy for history of colon polyps due to her medical issues -continue PPI as prescribed, NO NSAIDs  She will f/u in 3 months for reassessment or sooner with questions / concerns.   StevCarolina Cellar [facility] Pager [phone]

## 2015-12-16 ENCOUNTER — Telehealth: Payer: Self-pay | Admitting: Gastroenterology

## 2015-12-16 NOTE — Telephone Encounter (Signed)
Okay. Will repeat labs in 2 weeks. May consider another change pending her course. Thanks

## 2015-12-16 NOTE — Telephone Encounter (Signed)
Labs are in EPIC already.

## 2015-12-16 NOTE — Telephone Encounter (Signed)
Spoke with patient's husband and patient's legs are swelling last night and today. He is going to increase her Lasix back up to 80 mg BID.

## 2015-12-18 DIAGNOSIS — J449 Chronic obstructive pulmonary disease, unspecified: Secondary | ICD-10-CM | POA: Diagnosis not present

## 2015-12-19 ENCOUNTER — Ambulatory Visit (INDEPENDENT_AMBULATORY_CARE_PROVIDER_SITE_OTHER): Payer: PPO | Admitting: Gastroenterology

## 2015-12-19 DIAGNOSIS — Z23 Encounter for immunization: Secondary | ICD-10-CM | POA: Diagnosis not present

## 2015-12-26 ENCOUNTER — Ambulatory Visit: Payer: PPO | Admitting: Internal Medicine

## 2015-12-26 ENCOUNTER — Encounter: Payer: Self-pay | Admitting: Internal Medicine

## 2015-12-26 VITALS — BP 118/60 | HR 68 | Temp 97.8°F | Resp 16

## 2015-12-26 DIAGNOSIS — M545 Low back pain, unspecified: Secondary | ICD-10-CM

## 2015-12-26 DIAGNOSIS — R21 Rash and other nonspecific skin eruption: Secondary | ICD-10-CM | POA: Diagnosis not present

## 2015-12-26 DIAGNOSIS — K7469 Other cirrhosis of liver: Secondary | ICD-10-CM

## 2015-12-26 MED ORDER — TRIAMCINOLONE ACETONIDE 0.1 % EX CREA: 1.0000 "application " | TOPICAL_CREAM | Freq: Three times a day (TID) | CUTANEOUS | Status: DC

## 2015-12-26 MED ORDER — HYDROCORTISONE 2.5 % RE CREA: TOPICAL_CREAM | RECTAL | Status: DC

## 2015-12-26 MED ORDER — DEXAMETHASONE SODIUM PHOSPHATE 100 MG/10ML IJ SOLN
10.0000 mg | Freq: Once | INTRAMUSCULAR | Status: AC
  Administered 2015-12-26: 10 mg via INTRAMUSCULAR

## 2015-12-26 NOTE — Patient Instructions (Signed)
Please use triamcinolone on your regular skin to help prevent itching.    Use cool compresses or ice on the areas that are severely itchy.  Please take claritin daily to help stop itching.  Use proctozone cream on the skin break down on your bottom as needed.  Use A&D ointment on top of the proctozone cream.    Use johnsons baby shampoo.

## 2015-12-26 NOTE — Progress Notes (Signed)
Assessment and Plan:   1. Right-sided low back pain without sciatica -likely arthritis -not good candidate for pain medication due to cirrhosis - Ambulatory referral to Neurosurgery  2. Other cirrhosis of liver (HCC) -appears to be stable on current regimen -will defer labs to Dr. Havery Moros -recommended tight compliance with lactulose  3. Rash and nonspecific skin eruption -unkown origin -vaseline -kenalog cream -claritin - dexamethasone (DECADRON) injection 10 mg; Inject 1 mL (10 mg total) into the muscle once.      HPI 70 y.o.female presents for 1 month follow up of cirrhosis of the liver.  She reports that she has been doing well recently.  She reports that she has been very itchy recently.  She has been using hydrocortisone cream but nothing has helped with it.  She reports that she has no changes in her medications and no changes in personal products.  She reports that she is unsure whether this is coming from her liver.  Patient reports that they have been doing well.  female is taking their medication. She has been taking her lasix 80 mg BID and half tablet of zaroxolyn Monday and Friday.  She reports that her legs do feel a little bit tighter than usual.  She is having at least 2 bowel movements a day.  She has some rawness on her bottom from wiping too often.  She did have to back off on her medication.    They are not having difficulty with their medications.    She reports that her back has been bothering her a lot recently.  She can barely walk a few feet without having severe pain.  She would like to go see Dr. Joya Salm for a second opinion.  She has a history of chronic back pain.    She is also having continued shoulder pain.  IA injection helped a little bit but is not helping much any more.  She is not doing PT on it curretly.    Past Medical History  Diagnosis Date  . Hx of shoulder replacement   . Hypertension   . Hyperlipidemia   . COPD (chronic obstructive  pulmonary disease) ([place])   . Vitamin D deficiency   . IBS (irritable bowel syndrome)   . Fibromyalgia   . Hepatic cirrhosis ([place])   . Esophageal varices ([place])   . Hepatic encephalopathy ([place])   . Gastritis   . Esophageal varices ([place])   . Splenomegaly      Allergies  Allergen Reactions  . Atorvastatin Other (See Comments)    Doesn't remember  . Diphenhydramine Hcl (Sleep) Hives  . Hydrocodone-Acetaminophen Other (See Comments)    Doesn't remember  . Lopid [Gemfibrozil] Other (See Comments)    Doesn't remember  . Loratadine Hives  . Lorazepam Hives  . Simvastatin Other (See Comments)  . Sulfamethoxazole Hives  . Sulfonamide Derivatives Hives      Current Outpatient Prescriptions on File Prior to Visit  Medication Sig Dispense Refill  . allopurinol (ZYLOPRIM) 300 MG tablet 1/2-1 tablet daily for gout (Patient taking differently: Take 150 mg by mouth daily. ) 90 tablet 1  . aspirin 81 MG tablet Take 81 mg by mouth daily.    . benzonatate (TESSALON) 100 MG capsule Take 100 mg by mouth 3 (three) times daily as needed for cough.    . bisoprolol (ZEBETA) 10 MG tablet Take 1 tablet (10 mg total) by mouth daily. 30 tablet 11  . citalopram (CELEXA) 40 MG tablet Take 40 mg by  mouth 2 (two) times daily.    . cyclobenzaprine (FLEXERIL) 10 MG tablet Take 1 tablet (10 mg total) by mouth 3 (three) times daily as needed for muscle spasms. 30 tablet 0  . doxycycline (VIBRA-TABS) 100 MG tablet Take 1 tablet (100 mg total) by mouth 2 (two) times daily. 20 tablet 0  . ferrous sulfate 325 (65 FE) MG tablet TAKE 1 TABLET TWICE DAILY (Patient taking differently: TAKE 1 TABLET ONCE DAILY) 60 tablet 5  . furosemide (LASIX) 80 MG tablet Take 80 mg by mouth 2 (two) times daily. Patient takes 40 mg daily on Wednesday    . gabapentin (NEURONTIN) 300 MG capsule take 1 capsule by mouth in the morning, 1 capsule in the afternoon and 1-3 capsules at night (Patient taking differently: Take 300 mg by mouth 3  (three) times daily. ) 120 capsule 2  . ipratropium (ATROVENT) 0.02 % nebulizer solution use 1 vial in nebulizer EVERY 4 HOURS AS NEEDED FOR WHEEZING OR SHORTNESS OF BREATH 75 mL 3  . lactulose (CHRONULAC) 10 GM/15ML solution Take 30 ml (1 ounce) 2 x day 1892 mL 99  . Magnesium 250 MG TABS Take 250 mg by mouth daily at 6 (six) AM.    . metFORMIN (GLUCOPHAGE) 500 MG tablet Take 1,000 mg by mouth 2 (two) times daily.     . metolazone (ZAROXOLYN) 5 MG tablet Take 2.5 mg by mouth 2 (two) times a week. Wed and Sun    . ondansetron (ZOFRAN) 4 MG tablet Take 1 tablet (4 mg total) by mouth daily as needed for nausea or vomiting. 30 tablet 1  . OXYGEN Inhale 3 L/min into the lungs daily as needed (On exertion and at home).     . pantoprazole (PROTONIX) 40 MG tablet Take 1 tablet (40 mg total) by mouth 2 (two) times daily. 180 tablet 1  . potassium chloride (K-DUR) 10 MEQ tablet Take 2 tablets (20 mEq total) by mouth daily. 60 tablet 6  . rifaximin (XIFAXAN) 550 MG TABS tablet Take 1 tablet (550 mg total) by mouth 2 (two) times daily. 60 tablet 3  . spironolactone (ALDACTONE) 25 MG tablet TAKE 1 TABLET BY MOUTH THREE TIMES DAILY FOR FLUIDS AND SWELLING 90 tablet 1  . tamoxifen (NOLVADEX) 20 MG tablet Take 1 tablet (20 mg total) by mouth daily. 90 tablet 2  . traMADol (ULTRAM) 50 MG tablet Take 1 tablet (50 mg total) by mouth every 8 (eight) hours as needed for moderate pain. 90 tablet 1   No current facility-administered medications on file prior to visit.    ROS: Rash, rectal skin break down, itching, redness, diarrhea, back pain, shoulder pain  all negative except above.   Physical Exam: There were no vitals filed for this visit. BP 118/60 mmHg  Pulse 68  Temp(Src) 97.8 F (36.6 C) (Temporal)  Resp 16 General Appearance: Wheelchair bound, [place] O2 in place, Well developed well nourished, non-toxic appearing in no apparent distress. Eyes: PERRLA, EOMs, conjunctiva w/ no swelling or erythema or  discharge Sinuses: No Frontal/maxillary tenderness ENT/Mouth: Ear canals clear without swelling or erythema.  TM's normal bilaterally with no retractions, bulging, or loss of landmarks.   Neck: Supple, thyroid normal, no notable JVD  Respiratory: Respiratory effort normal, Occasional scattered wheeze with decreased breath sounds at the bases bilaterally. No retractions or accessory muscle usage. Cardio: RRR with no MRGs.   Abdomen: Soft, + BS.  Non tender, no guarding, rebound, hernias, masses.  Musculoskeletal: Full ROM, 5/5 strength,  normal gait.  Skin: Warm, dry with scattered excoriated macular rashes to the abdomen, legs, and flank.  Lesions are approximate 3 cms in size and without surrounding redness.  Skin dry and scaling to the touch.   Neuro: Awake and oriented X 3, Cranial nerves intact. Normal muscle tone, no cerebellar symptoms. Sensation intact.  Psych: normal affect, Insight and Judgment appropriate.     Starlyn Skeans, PA-C 11:37 AM [facility]

## 2015-12-27 ENCOUNTER — Telehealth: Payer: Self-pay | Admitting: *Deleted

## 2015-12-27 MED ORDER — RIFAXIMIN 550 MG PO TABS
550.0000 mg | ORAL_TABLET | Freq: Two times a day (BID) | ORAL | Status: DC
Start: 2015-12-27 — End: 2016-01-19

## 2015-12-27 NOTE — Telephone Encounter (Signed)
-----   Message from Manus Gunning, MD sent at 12/27/2015  9:54 AM EDT ----- Faythe Ghee thanks. We can give it to her and tell husband to only take if he thinks the lactulose is not working. No benefit to giving it for a few days (it is for hepatic encephalopathy), but would only use it when she is not doing well on lactulose. Thanks  ----- Message -----    From: Larina Bras, CMA    Sent: 12/27/2015   8:43 AM      To: Manus Gunning, MD  Dr Havery Moros- We finally got a few Xifaxan samples in for this patient (you had asked that I let you know when we got any). Unfortunately, it is only a 6 day supply but at least we can try her on it...Marland KitchenMarland Kitchen

## 2015-12-27 NOTE — Telephone Encounter (Signed)
I have spoken to Mr Billiot (patient's husband) and have advised of Dr Doyne Keel recommendations for Xifaxan. He verbalizes understanding and will pick up samples at front desk.

## 2015-12-28 ENCOUNTER — Other Ambulatory Visit: Payer: Self-pay | Admitting: Internal Medicine

## 2015-12-30 ENCOUNTER — Other Ambulatory Visit (INDEPENDENT_AMBULATORY_CARE_PROVIDER_SITE_OTHER): Payer: PPO

## 2015-12-30 DIAGNOSIS — K7290 Hepatic failure, unspecified without coma: Secondary | ICD-10-CM | POA: Diagnosis not present

## 2015-12-30 DIAGNOSIS — K7682 Hepatic encephalopathy: Secondary | ICD-10-CM

## 2015-12-30 DIAGNOSIS — K7460 Unspecified cirrhosis of liver: Secondary | ICD-10-CM

## 2015-12-30 DIAGNOSIS — L0390 Cellulitis, unspecified: Secondary | ICD-10-CM | POA: Diagnosis not present

## 2015-12-30 LAB — BASIC METABOLIC PANEL
BUN: 21 mg/dL (ref 6–23)
CALCIUM: 9.7 mg/dL (ref 8.4–10.5)
CHLORIDE: 92 meq/L — AB (ref 96–112)
CO2: 35 meq/L — AB (ref 19–32)
Creatinine, Ser: 1.19 mg/dL (ref 0.40–1.20)
GFR: 47.78 mL/min — ABNORMAL LOW (ref >60.00)
Glucose, Bld: 135 mg/dL — ABNORMAL HIGH (ref 70–99)
Potassium: 3.8 mEq/L (ref 3.5–5.1)
SODIUM: 133 meq/L — AB (ref 135–145)

## 2016-01-03 ENCOUNTER — Other Ambulatory Visit: Payer: Self-pay | Admitting: Internal Medicine

## 2016-01-03 ENCOUNTER — Other Ambulatory Visit: Payer: Self-pay | Admitting: *Deleted

## 2016-01-03 DIAGNOSIS — K7460 Unspecified cirrhosis of liver: Secondary | ICD-10-CM

## 2016-01-05 DIAGNOSIS — E1122 Type 2 diabetes mellitus with diabetic chronic kidney disease: Secondary | ICD-10-CM | POA: Diagnosis not present

## 2016-01-05 DIAGNOSIS — Z7984 Long term (current) use of oral hypoglycemic drugs: Secondary | ICD-10-CM | POA: Diagnosis not present

## 2016-01-05 DIAGNOSIS — N183 Chronic kidney disease, stage 3 (moderate): Secondary | ICD-10-CM | POA: Diagnosis not present

## 2016-01-05 DIAGNOSIS — Z9181 History of falling: Secondary | ICD-10-CM | POA: Diagnosis not present

## 2016-01-05 DIAGNOSIS — Z9981 Dependence on supplemental oxygen: Secondary | ICD-10-CM | POA: Diagnosis not present

## 2016-01-05 DIAGNOSIS — J9611 Chronic respiratory failure with hypoxia: Secondary | ICD-10-CM | POA: Diagnosis not present

## 2016-01-05 DIAGNOSIS — I872 Venous insufficiency (chronic) (peripheral): Secondary | ICD-10-CM | POA: Diagnosis not present

## 2016-01-05 DIAGNOSIS — J449 Chronic obstructive pulmonary disease, unspecified: Secondary | ICD-10-CM | POA: Diagnosis not present

## 2016-01-05 DIAGNOSIS — R2689 Other abnormalities of gait and mobility: Secondary | ICD-10-CM | POA: Diagnosis not present

## 2016-01-05 DIAGNOSIS — I129 Hypertensive chronic kidney disease with stage 1 through stage 4 chronic kidney disease, or unspecified chronic kidney disease: Secondary | ICD-10-CM | POA: Diagnosis not present

## 2016-01-16 DIAGNOSIS — M7542 Impingement syndrome of left shoulder: Secondary | ICD-10-CM | POA: Diagnosis not present

## 2016-01-18 ENCOUNTER — Ambulatory Visit: Payer: PPO | Admitting: Physician Assistant

## 2016-01-18 ENCOUNTER — Encounter: Payer: Self-pay | Admitting: Physician Assistant

## 2016-01-18 VITALS — BP 120/60 | HR 69 | Temp 98.1°F | Resp 16 | Ht 62.0 in | Wt 180.0 lb

## 2016-01-18 DIAGNOSIS — Z9181 History of falling: Secondary | ICD-10-CM

## 2016-01-18 DIAGNOSIS — K7290 Hepatic failure, unspecified without coma: Secondary | ICD-10-CM

## 2016-01-18 DIAGNOSIS — K7682 Hepatic encephalopathy: Secondary | ICD-10-CM

## 2016-01-18 DIAGNOSIS — D696 Thrombocytopenia, unspecified: Secondary | ICD-10-CM

## 2016-01-18 DIAGNOSIS — N183 Chronic kidney disease, stage 3 unspecified: Secondary | ICD-10-CM

## 2016-01-18 DIAGNOSIS — E1122 Type 2 diabetes mellitus with diabetic chronic kidney disease: Secondary | ICD-10-CM | POA: Diagnosis not present

## 2016-01-18 DIAGNOSIS — K7581 Nonalcoholic steatohepatitis (NASH): Secondary | ICD-10-CM | POA: Diagnosis not present

## 2016-01-18 DIAGNOSIS — M797 Fibromyalgia: Secondary | ICD-10-CM | POA: Diagnosis not present

## 2016-01-18 DIAGNOSIS — E876 Hypokalemia: Secondary | ICD-10-CM | POA: Diagnosis not present

## 2016-01-18 DIAGNOSIS — M109 Gout, unspecified: Secondary | ICD-10-CM

## 2016-01-18 DIAGNOSIS — E785 Hyperlipidemia, unspecified: Secondary | ICD-10-CM

## 2016-01-18 DIAGNOSIS — Z0000 Encounter for general adult medical examination without abnormal findings: Secondary | ICD-10-CM | POA: Diagnosis not present

## 2016-01-18 DIAGNOSIS — K589 Irritable bowel syndrome without diarrhea: Secondary | ICD-10-CM

## 2016-01-18 DIAGNOSIS — J441 Chronic obstructive pulmonary disease with (acute) exacerbation: Secondary | ICD-10-CM

## 2016-01-18 DIAGNOSIS — J449 Chronic obstructive pulmonary disease, unspecified: Secondary | ICD-10-CM | POA: Diagnosis not present

## 2016-01-18 DIAGNOSIS — K209 Esophagitis, unspecified without bleeding: Secondary | ICD-10-CM

## 2016-01-18 DIAGNOSIS — I10 Essential (primary) hypertension: Secondary | ICD-10-CM | POA: Diagnosis not present

## 2016-01-18 DIAGNOSIS — K7460 Unspecified cirrhosis of liver: Secondary | ICD-10-CM

## 2016-01-18 DIAGNOSIS — Z79899 Other long term (current) drug therapy: Secondary | ICD-10-CM | POA: Diagnosis not present

## 2016-01-18 DIAGNOSIS — J418 Mixed simple and mucopurulent chronic bronchitis: Secondary | ICD-10-CM

## 2016-01-18 DIAGNOSIS — I872 Venous insufficiency (chronic) (peripheral): Secondary | ICD-10-CM

## 2016-01-18 DIAGNOSIS — J9612 Chronic respiratory failure with hypercapnia: Secondary | ICD-10-CM

## 2016-01-18 DIAGNOSIS — E559 Vitamin D deficiency, unspecified: Secondary | ICD-10-CM

## 2016-01-18 DIAGNOSIS — J9611 Chronic respiratory failure with hypoxia: Secondary | ICD-10-CM

## 2016-01-18 DIAGNOSIS — K5730 Diverticulosis of large intestine without perforation or abscess without bleeding: Secondary | ICD-10-CM

## 2016-01-18 DIAGNOSIS — K2100 Gastro-esophageal reflux disease with esophagitis, without bleeding: Secondary | ICD-10-CM

## 2016-01-18 DIAGNOSIS — D126 Benign neoplasm of colon, unspecified: Secondary | ICD-10-CM

## 2016-01-18 DIAGNOSIS — E669 Obesity, unspecified: Secondary | ICD-10-CM

## 2016-01-18 DIAGNOSIS — E1129 Type 2 diabetes mellitus with other diabetic kidney complication: Secondary | ICD-10-CM | POA: Diagnosis not present

## 2016-01-18 DIAGNOSIS — I8510 Secondary esophageal varices without bleeding: Secondary | ICD-10-CM

## 2016-01-18 MED ORDER — AZITHROMYCIN 250 MG PO TABS: ORAL_TABLET | ORAL | Status: DC

## 2016-01-18 NOTE — Patient Instructions (Signed)
Continue the lasix 80 mg twice a day, increase zaroxyln to 2.77m mon, wed AND Friday, monitor weight Decrease salt/sugars  Will check labs.   Get on zpak

## 2016-01-18 NOTE — Progress Notes (Signed)
CPE AND FOLLOW UP  Assessment:   1. Essential hypertension -Well controlled - TSH -cont meds -diet and exercise -monitor at home  2. Mixed simple and mucopurulent chronic bronchitis ([place]) -followed by pulmonology  3. Type 2 diabetes mellitus with stage 3 chronic kidney disease, without long-term current use of insulin (HCC) -cont diet and exercise - Hemoglobin A1c  4. Other cirrhosis of liver (HCC) -monitored by GI -cont lactulose -cont xifaximin - AMMONIA  5. Hyperlipidemia -cont meds -diet and exercise - Lipid panel  6. Vitamin D deficiency -cont Vit D supplement  7. Medication management  - CBC with Differential/Platelet - BASIC METABOLIC PANEL WITH GFR - Hepatic function panel - Magnesium  8. Secondary esophageal varices without bleeding (HCC) -cont to monitor with GI  9. Venous insufficiency -cont lasix  10. COPD exacerbation ([place]) -followed by pulmonology  11. Chronic respiratory failure with hypoxia and hypercapnia (HCC) -cont O2 -followed by pulmonology  12. COPD GOLD II with min reversiblity  -followed by pulmonology  13. Benign neoplasm of colon, unspecified part of colon -cont screening colonoscopy -sees GI  14. Esophagitis -followed by GI  15. Diverticulosis of large intestine without hemorrhage -without current infection or hemorrhage -monitor on colonoscopy  16. GERD -cont meds  17. IBS -cont meds -followed by GI  18. Hepatic encephalopathy (HCC) -cont lactulose and xifaximin - check lab  19. Fibromyalgia Off elevil? If need to increase gabapentin to 4 times a day  20. CKD stage 3 due to type 2 diabetes mellitus (HCC) -cont meds -diet and exercise as tolerated  21. Obesity -cont diet and exercise  22. At high risk for falls  A power wheelchair would greatly improve and assist the patient with her ADL's and make him more independent.  23. Thrombocytopenia ([place]) -monitor with CBC  24.  Hypokalemia -resolved -monitor BMET  25. Gout Will check uric acid, has increase gout med from 1/2 to whole for the past week    Over 30 minutes of exam, counseling, chart review, and critical decision making was performed   Subjective:   [name] is a 70 y.o. female who presents for CPE and 3 month follow up on hypertension, diabetes, hyperlipidemia, vitamin D def.    Her blood pressure has been controlled at home, today their BP is BP: 120/60 mmHg She does not workout. She denies chest pain, shortness of breath, dizziness.  She is not on cholesterol medication and denies myalgias. Her cholesterol is not at goal. The cholesterol last visit was:   Lab Results  Component Value Date   CHOL 176 11/14/2015   HDL 31* 11/14/2015   LDLCALC 81 11/14/2015   TRIG 321* 11/14/2015   CHOLHDL 5.7* 11/14/2015   She has not been working on diet and exercise for diabetes, and denies foot ulcerations, hyperglycemia, hypoglycemia , increased appetite, nausea, paresthesia of the feet, polydipsia, polyuria, visual disturbances, vomiting and weight loss. Last A1C in the office was:  Lab Results  Component Value Date   HGBA1C 6.7* 11/14/2015   Last GFR  Lab Results  Component Value Date   GFRNONAA 34* 11/14/2015   Patient is on Vitamin D supplement. Lab Results  Component Value Date   VD25OH 53 04/15/2015      She recently saw Dr. Havery Moros who has ordered a workup for her NASH, she has history of esophageal varices and but is on bisoprolol rather than naldalol due to lung function. She has had several admissions for encephalopathy, she has been taking her  lactulose regularly per her husband.    Her husband states that her weight has been increasing, she is on 78m twice a day and getting 2.5 of the metolazone two days week, her husband has been giving her a whole for 1 week, she is drinking. Has had increase in ice craving, has been drinking 2 glasses of OJ in the AM and drinking a  few glasses of sweet tea. Her weight is up 3-5 lbs and she   Patient has poor endurance and balance and has had several falls at home. Even with a walker she experiences poor endurance only able to ambulate 30-40 feet with a walker. Her quality of life is severely limited by his lack of mobility in achieving his personal ADL's and mobility through out her apartment/home. Also her mobility to travel for physician appointments is limited.  Patient is unable to use a manual wheelchair due to shoulder issues which prevent her from self-propelling.  A power wheelchair would greatly improve and assist the patient with her ADL's and make him more independent. Complains of bilateral shoulder pain, was given diclofenac gel for this, follows with ortho.    Medication Review Current Outpatient Prescriptions on File Prior to Visit  Medication Sig Dispense Refill  . allopurinol (ZYLOPRIM) 300 MG tablet 1/2-1 tablet daily for gout 90 tablet 1  . aspirin 81 MG tablet Take 81 mg by mouth daily.    . bisoprolol (ZEBETA) 10 MG tablet Take 1 tablet (10 mg total) by mouth daily. 30 tablet 11  . citalopram (CELEXA) 40 MG tablet Take 40 mg by mouth 2 (two) times daily.    . cyclobenzaprine (FLEXERIL) 10 MG tablet Take 1 tablet (10 mg total) by mouth 3 (three) times daily as needed for muscle spasms. 30 tablet 0  . ferrous sulfate 325 (65 FE) MG tablet TAKE 1 TABLET TWICE DAILY (Patient taking differently: TAKE 1 TABLET ONCE DAILY) 60 tablet 5  . furosemide (LASIX) 80 MG tablet Take 80 mg by mouth 2 (two) times daily. Patient takes 40 mg daily on Wednesday    . gabapentin (NEURONTIN) 300 MG capsule take 1 capsule by mouth in the morning, 1 capsule in the afternoon and 1-3 capsules at night (Patient taking differently: Take 300 mg by mouth 3 (three) times daily. ) 120 capsule 2  . hydrocortisone (ANUSOL-HC) 2.5 % rectal cream Apply rectally 2 times daily 30 g 1  . ipratropium (ATROVENT) 0.02 % nebulizer solution use 1  vial in nebulizer EVERY 4 HOURS AS NEEDED FOR WHEEZING OR SHORTNESS OF BREATH 75 mL 3  . lactulose (CHRONULAC) 10 GM/15ML solution Take 30 ml (1 ounce) 2 x day 1892 mL 99  . Magnesium 250 MG TABS Take 250 mg by mouth daily at 6 (six) AM.    . metFORMIN (GLUCOPHAGE) 500 MG tablet Take 1,000 mg by mouth 2 (two) times daily.     . metolazone (ZAROXOLYN) 5 MG tablet Take 2.5 mg by mouth 2 (two) times a week. Wed and Sun    . ondansetron (ZOFRAN) 4 MG tablet Take 1 tablet (4 mg total) by mouth daily as needed for nausea or vomiting. 30 tablet 1  . OXYGEN Inhale 3 L/min into the lungs daily as needed (On exertion and at home).     . pantoprazole (PROTONIX) 40 MG tablet Take 1 tablet (40 mg total) by mouth 2 (two) times daily. 180 tablet 1  . potassium chloride (K-DUR) 10 MEQ tablet Take 2 tablets (20 mEq total)  by mouth daily. 60 tablet 6  . rifaximin (XIFAXAN) 550 MG TABS tablet Take 1 tablet (550 mg total) by mouth 2 (two) times daily. If lactulose is not working 12 tablet 0  . spironolactone (ALDACTONE) 25 MG tablet TAKE 1 TABLET BY MOUTH THREE TIMES DAILY FOR FLUIDS AND SWELLING 90 tablet 1  . tamoxifen (NOLVADEX) 20 MG tablet Take 1 tablet (20 mg total) by mouth daily. 90 tablet 2  . traMADol (ULTRAM) 50 MG tablet take 1 TABLET BY MOUTH EVERY 8 HOURS 90 tablet 1  . triamcinolone cream (KENALOG) 0.1 % Apply 1 application topically 3 (three) times daily. 45 g 0   No current facility-administered medications on file prior to visit.    Current Problems (verified) Patient Active Problem List   Diagnosis Date Noted  . DM type 2 causing CKD stage 3 ([place]) 10/30/2015  . Hepatic encephalopathy ([place]) 09/21/2015  . Hypokalemia 09/21/2015  . COPD GOLD II with min reversiblity  08/30/2015  . CKD stage 3 due to type 2 diabetes mellitus ([place]) 08/16/2015  . Chronic respiratory failure with hypoxia and hypercapnia ([place]) 08/07/2015  . Thrombocytopenia ([place]) 08/07/2015  . Venous insufficiency 07/26/2015  .  Encounter for Medicare annual wellness exam 02/16/2015  . At high risk for falls 01/13/2015  . Obesity 09/21/2014  . Medication management 12/14/2013  . Essential hypertension   . Hyperlipidemia   . COPD (chronic obstructive pulmonary disease) ([place])   . GERD   . Vitamin D deficiency   . IBS   . Fibromyalgia   . Esophageal varices ([place]) 07/08/2012  . COLONIC POLYPS 11/11/2008  . Anemia 05/28/2008  . Hepatic cirrhosis ([place]) 05/28/2008  . Esophagitis 05/27/2008  . Diverticulosis of large intestine 05/27/2008    Screening Tests Immunization History  Administered Date(s) Administered  . Hep A / Hep B 11/05/2013, 11/12/2013, 12/07/2013, 12/31/2014  . Hepatitis B, adult 12/19/2015  . Hepatitis B, ped/adol 11/17/2015  . Influenza Split 05/26/2013, 06/17/2014  . Influenza, High Dose Seasonal PF 06/15/2015  . Pneumococcal Conjugate-13 07/22/2015  . Pneumococcal Polysaccharide-23 08/28/2013  . Tdap 11/19/2012    Preventative care: Last colonoscopy: 2015 Last mammogram: 2012 per epic chart review, is followed by Dr. Lindi Adie for breast cancer history  DEXA:2016  Prior vaccinations: TD or Tdap: 2014  Influenza: 2016  Pneumococcal: 2015 Prevnar13: 2016 Shingles/Zostavax: Declines, not indicated due to other chronic conditions  Names of Other Physician/Practitioners you currently use: 1. [facility]- here for primary care 2. Dr. Patrici Ranks, eye doctor, last visit several years ago 79. Dr. Johnella Moloney, dentist, last visit 2016 Patient Care Team: Unk Pinto, MD as PCP - General (Internal Medicine) Inda Castle, MD as Consulting Physician (Gastroenterology) Izora Gala, MD as Consulting Physician (Otolaryngology)  Past Surgical History  Procedure Laterality Date  . Hip arthroplasty Bilateral   . Shoulder surgery Right   . Back surgery    . Cholecystectomy    . Neck surgery    . Esophagogastroduodenoscopy  07/16/2012    Procedure:  ESOPHAGOGASTRODUODENOSCOPY (EGD);  Surgeon: Inda Castle, MD;  Location: [facility];  Service: Endoscopy;  Laterality: N/A;  . Gastric varices banding  07/16/2012    Procedure: GASTRIC VARICES BANDING;  Surgeon: Inda Castle, MD;  Location: [facility];  Service: Endoscopy;  Laterality: N/A;   Family History  Problem Relation Age of Onset  . Colon cancer Neg Hx   . Diabetes Brother     deceased  . Heart disease Sister  A Fib  . Heart disease Mother     CHF  . Atrial fibrillation Sister    Social History  Substance Use Topics  . Smoking status: Former Smoker -- 1.00 packs/day for 50 years    Types: Cigarettes    Quit date: 11/05/2014  . Smokeless tobacco: Never Used     Comment: Quit April 2016  . Alcohol Use: No    Objective:   Today's Vitals   01/18/16 1017  BP: 120/60  Pulse: 69  Temp: 98.1 F (36.7 C)  TempSrc: Temporal  Resp: 16  Height: 5' 2"  (1.575 m)  Weight: 180 lb (81.647 kg)  SpO2: 97%   Body mass index is 32.91 kg/(m^2). General Appearance: Well nourished, in no apparent distress. Eyes: PERRLA, EOMs, conjunctiva no swelling or erythema Sinuses: No Frontal/maxillary tenderness ENT/Mouth: Ext aud canals clear, TMs without erythema, bulging. + erythema on post pharynx without swelling, or exudate.  Tonsils not swollen or erythematous. Neck: Supple, thyroid normal.  Respiratory: 3L O2 via [place], diffuse decreased breath sounds, BS equal bilaterally without rales, rhonchi, wheezing or stridor.  Cardio: RRR with 2/6 systolic murmur. Brisk peripheral pulses with bilateral edema, with stasis dermatitis, depdendent rubor Abdomen: Soft, + BS, Distended, LUQ/epigastric tender, no guarding, rebound, hernias, masses. Lymphatics: + bilateral tender cervical lymphadenopathy.  Musculoskeletal: Full ROM, 4/5 strength, in wheelchair, Decreased ROM left shoulder due to pain. Anterior right shin with healing scabs and erythema, no weeping.  Skin: Warm, dry without  rashes, lesions, ecchymosis.  Neuro: Cranial nerves intact. No cerebellar symptoms.  Psych: Awake and oriented X 3, normal affect, Insight and Judgment appropriate.    Vicie Mutters, PA-C   01/18/2016

## 2016-01-19 ENCOUNTER — Telehealth: Payer: Self-pay | Admitting: Physician Assistant

## 2016-01-19 ENCOUNTER — Emergency Department (HOSPITAL_COMMUNITY): Payer: PPO

## 2016-01-19 ENCOUNTER — Inpatient Hospital Stay (HOSPITAL_COMMUNITY)
Admission: EM | Admit: 2016-01-19 | Discharge: 2016-01-24 | DRG: 190 | Disposition: A | Discharge status: Home Health | Payer: PPO | Attending: Internal Medicine | Admitting: Internal Medicine

## 2016-01-19 ENCOUNTER — Encounter (HOSPITAL_COMMUNITY): Payer: Self-pay | Admitting: Family Medicine

## 2016-01-19 DIAGNOSIS — J9622 Acute and chronic respiratory failure with hypercapnia: Secondary | ICD-10-CM | POA: Diagnosis not present

## 2016-01-19 DIAGNOSIS — Z87891 Personal history of nicotine dependence: Secondary | ICD-10-CM | POA: Diagnosis not present

## 2016-01-19 DIAGNOSIS — J9621 Acute and chronic respiratory failure with hypoxia: Secondary | ICD-10-CM | POA: Diagnosis present

## 2016-01-19 DIAGNOSIS — K7469 Other cirrhosis of liver: Secondary | ICD-10-CM | POA: Diagnosis not present

## 2016-01-19 DIAGNOSIS — I4510 Unspecified right bundle-branch block: Secondary | ICD-10-CM | POA: Diagnosis present

## 2016-01-19 DIAGNOSIS — T380X5A Adverse effect of glucocorticoids and synthetic analogues, initial encounter: Secondary | ICD-10-CM | POA: Diagnosis present

## 2016-01-19 DIAGNOSIS — K2100 Gastro-esophageal reflux disease with esophagitis, without bleeding: Secondary | ICD-10-CM | POA: Diagnosis present

## 2016-01-19 DIAGNOSIS — M109 Gout, unspecified: Secondary | ICD-10-CM | POA: Diagnosis present

## 2016-01-19 DIAGNOSIS — M797 Fibromyalgia: Secondary | ICD-10-CM | POA: Diagnosis present

## 2016-01-19 DIAGNOSIS — E1165 Type 2 diabetes mellitus with hyperglycemia: Secondary | ICD-10-CM | POA: Diagnosis not present

## 2016-01-19 DIAGNOSIS — E669 Obesity, unspecified: Secondary | ICD-10-CM | POA: Diagnosis present

## 2016-01-19 DIAGNOSIS — I4891 Unspecified atrial fibrillation: Secondary | ICD-10-CM | POA: Diagnosis present

## 2016-01-19 DIAGNOSIS — I471 Supraventricular tachycardia: Secondary | ICD-10-CM

## 2016-01-19 DIAGNOSIS — K7581 Nonalcoholic steatohepatitis (NASH): Secondary | ICD-10-CM | POA: Diagnosis not present

## 2016-01-19 DIAGNOSIS — E876 Hypokalemia: Secondary | ICD-10-CM | POA: Diagnosis not present

## 2016-01-19 DIAGNOSIS — Z9981 Dependence on supplemental oxygen: Secondary | ICD-10-CM

## 2016-01-19 DIAGNOSIS — K7460 Unspecified cirrhosis of liver: Secondary | ICD-10-CM | POA: Diagnosis present

## 2016-01-19 DIAGNOSIS — D696 Thrombocytopenia, unspecified: Secondary | ICD-10-CM | POA: Diagnosis not present

## 2016-01-19 DIAGNOSIS — Z6834 Body mass index (BMI) 34.0-34.9, adult: Secondary | ICD-10-CM | POA: Diagnosis not present

## 2016-01-19 DIAGNOSIS — I4581 Long QT syndrome: Secondary | ICD-10-CM | POA: Diagnosis not present

## 2016-01-19 DIAGNOSIS — I480 Paroxysmal atrial fibrillation: Secondary | ICD-10-CM

## 2016-01-19 DIAGNOSIS — I130 Hypertensive heart and chronic kidney disease with heart failure and stage 1 through stage 4 chronic kidney disease, or unspecified chronic kidney disease: Secondary | ICD-10-CM | POA: Diagnosis not present

## 2016-01-19 DIAGNOSIS — E860 Dehydration: Secondary | ICD-10-CM | POA: Diagnosis present

## 2016-01-19 DIAGNOSIS — Z833 Family history of diabetes mellitus: Secondary | ICD-10-CM | POA: Diagnosis not present

## 2016-01-19 DIAGNOSIS — D6959 Other secondary thrombocytopenia: Secondary | ICD-10-CM | POA: Diagnosis present

## 2016-01-19 DIAGNOSIS — E559 Vitamin D deficiency, unspecified: Secondary | ICD-10-CM | POA: Diagnosis present

## 2016-01-19 DIAGNOSIS — I959 Hypotension, unspecified: Secondary | ICD-10-CM | POA: Diagnosis present

## 2016-01-19 DIAGNOSIS — N179 Acute kidney failure, unspecified: Secondary | ICD-10-CM | POA: Diagnosis not present

## 2016-01-19 DIAGNOSIS — E785 Hyperlipidemia, unspecified: Secondary | ICD-10-CM | POA: Diagnosis present

## 2016-01-19 DIAGNOSIS — I4719 Other supraventricular tachycardia: Secondary | ICD-10-CM

## 2016-01-19 DIAGNOSIS — I5032 Chronic diastolic (congestive) heart failure: Secondary | ICD-10-CM | POA: Diagnosis not present

## 2016-01-19 DIAGNOSIS — J209 Acute bronchitis, unspecified: Secondary | ICD-10-CM | POA: Diagnosis not present

## 2016-01-19 DIAGNOSIS — J690 Pneumonitis due to inhalation of food and vomit: Secondary | ICD-10-CM | POA: Diagnosis not present

## 2016-01-19 DIAGNOSIS — Z79899 Other long term (current) drug therapy: Secondary | ICD-10-CM | POA: Diagnosis not present

## 2016-01-19 DIAGNOSIS — Z7982 Long term (current) use of aspirin: Secondary | ICD-10-CM | POA: Diagnosis not present

## 2016-01-19 DIAGNOSIS — E872 Acidosis: Secondary | ICD-10-CM | POA: Diagnosis present

## 2016-01-19 DIAGNOSIS — J181 Lobar pneumonia, unspecified organism: Secondary | ICD-10-CM | POA: Diagnosis present

## 2016-01-19 DIAGNOSIS — N183 Chronic kidney disease, stage 3 unspecified: Secondary | ICD-10-CM | POA: Diagnosis present

## 2016-01-19 DIAGNOSIS — E1122 Type 2 diabetes mellitus with diabetic chronic kidney disease: Secondary | ICD-10-CM | POA: Diagnosis not present

## 2016-01-19 DIAGNOSIS — E871 Hypo-osmolality and hyponatremia: Secondary | ICD-10-CM | POA: Diagnosis not present

## 2016-01-19 DIAGNOSIS — D649 Anemia, unspecified: Secondary | ICD-10-CM | POA: Diagnosis not present

## 2016-01-19 DIAGNOSIS — R0602 Shortness of breath: Secondary | ICD-10-CM | POA: Diagnosis not present

## 2016-01-19 DIAGNOSIS — J441 Chronic obstructive pulmonary disease with (acute) exacerbation: Secondary | ICD-10-CM | POA: Diagnosis present

## 2016-01-19 DIAGNOSIS — Z882 Allergy status to sulfonamides status: Secondary | ICD-10-CM | POA: Diagnosis not present

## 2016-01-19 DIAGNOSIS — K589 Irritable bowel syndrome without diarrhea: Secondary | ICD-10-CM | POA: Diagnosis present

## 2016-01-19 DIAGNOSIS — Z96643 Presence of artificial hip joint, bilateral: Secondary | ICD-10-CM | POA: Diagnosis present

## 2016-01-19 DIAGNOSIS — Z885 Allergy status to narcotic agent status: Secondary | ICD-10-CM

## 2016-01-19 DIAGNOSIS — Z8249 Family history of ischemic heart disease and other diseases of the circulatory system: Secondary | ICD-10-CM

## 2016-01-19 DIAGNOSIS — Z888 Allergy status to other drugs, medicaments and biological substances status: Secondary | ICD-10-CM | POA: Diagnosis not present

## 2016-01-19 DIAGNOSIS — R079 Chest pain, unspecified: Secondary | ICD-10-CM | POA: Diagnosis not present

## 2016-01-19 DIAGNOSIS — J9611 Chronic respiratory failure with hypoxia: Secondary | ICD-10-CM | POA: Diagnosis not present

## 2016-01-19 DIAGNOSIS — R0789 Other chest pain: Secondary | ICD-10-CM | POA: Diagnosis not present

## 2016-01-19 DIAGNOSIS — J440 Chronic obstructive pulmonary disease with acute lower respiratory infection: Principal | ICD-10-CM | POA: Diagnosis present

## 2016-01-19 DIAGNOSIS — J9620 Acute and chronic respiratory failure, unspecified whether with hypoxia or hypercapnia: Secondary | ICD-10-CM | POA: Diagnosis not present

## 2016-01-19 DIAGNOSIS — K219 Gastro-esophageal reflux disease without esophagitis: Secondary | ICD-10-CM | POA: Diagnosis not present

## 2016-01-19 DIAGNOSIS — J9610 Chronic respiratory failure, unspecified whether with hypoxia or hypercapnia: Secondary | ICD-10-CM | POA: Diagnosis present

## 2016-01-19 LAB — COMPREHENSIVE METABOLIC PANEL
ALBUMIN: 3.2 g/dL — AB (ref 3.5–5.0)
ALT: 26 U/L (ref 14–54)
AST: 110 U/L — AB (ref 15–41)
Alkaline Phosphatase: 77 U/L (ref 38–126)
Anion gap: 17 — ABNORMAL HIGH (ref 5–15)
BUN: 20 mg/dL (ref 6–20)
CHLORIDE: 74 mmol/L — AB (ref 101–111)
CO2: 34 mmol/L — ABNORMAL HIGH (ref 22–32)
CREATININE: 1.35 mg/dL — AB (ref 0.44–1.00)
Calcium: 9.6 mg/dL (ref 8.9–10.3)
GFR calc Af Amer: 45 mL/min — ABNORMAL LOW (ref >60)
GFR, EST NON AFRICAN AMERICAN: 39 mL/min — AB (ref >60)
GLUCOSE: 142 mg/dL — AB (ref 65–99)
Potassium: 3 mmol/L — ABNORMAL LOW (ref 3.5–5.1)
Sodium: 125 mmol/L — ABNORMAL LOW (ref 135–145)
Total Bilirubin: 1.1 mg/dL (ref 0.3–1.2)
Total Protein: 5.4 g/dL — ABNORMAL LOW (ref 6.5–8.1)

## 2016-01-19 LAB — CBC WITH DIFFERENTIAL/PLATELET
BASOS ABS: 0 {cells}/uL (ref 0–200)
BASOS ABS: 0 10*3/uL (ref 0.0–0.1)
BASOS PCT: 0 %
Basophils Relative: 0 %
EOS ABS: 216 {cells}/uL (ref 15–500)
EOS PCT: 4 %
EOS PCT: 5 %
Eosinophils Absolute: 0.3 10*3/uL (ref 0.0–0.7)
HCT: 28.5 % — ABNORMAL LOW (ref 35.0–45.0)
HEMATOCRIT: 28.5 % — AB (ref 36.0–46.0)
Hemoglobin: 9.8 g/dL — ABNORMAL LOW (ref 11.7–15.5)
Hemoglobin: 9.6 g/dL — ABNORMAL LOW (ref 12.0–15.0)
LYMPHS PCT: 13 %
LYMPHS PCT: 20 %
Lymphs Abs: 702 cells/uL — ABNORMAL LOW (ref 850–3900)
Lymphs Abs: 1.1 10*3/uL (ref 0.7–4.0)
MCH: 33 pg (ref 27.0–33.0)
MCH: 32.7 pg (ref 26.0–34.0)
MCHC: 34.4 g/dL (ref 32.0–36.0)
MCHC: 33.7 g/dL (ref 30.0–36.0)
MCV: 96 fL (ref 80.0–100.0)
MCV: 96.9 fL (ref 78.0–100.0)
MONOS PCT: 7 %
MPV: 10.4 fL (ref 7.5–12.5)
Monocytes Absolute: 378 cells/uL (ref 200–950)
Monocytes Absolute: 0.3 10*3/uL (ref 0.1–1.0)
Monocytes Relative: 5 %
NEUTROS ABS: 3.9 10*3/uL (ref 1.7–7.7)
NEUTROS PCT: 76 %
Neutro Abs: 4104 cells/uL (ref 1500–7800)
Neutrophils Relative %: 70 %
PLATELETS: 97 10*3/uL — AB (ref 140–400)
PLATELETS: 107 10*3/uL — AB (ref 150–400)
RBC: 2.97 MIL/uL — ABNORMAL LOW (ref 3.80–5.10)
RBC: 2.94 MIL/uL — AB (ref 3.87–5.11)
RDW: 15.5 % — ABNORMAL HIGH (ref 11.0–15.0)
RDW: 14.4 % (ref 11.5–15.5)
WBC: 5.4 10*3/uL (ref 3.8–10.8)
WBC: 5.5 10*3/uL (ref 4.0–10.5)

## 2016-01-19 LAB — BASIC METABOLIC PANEL WITH GFR
BUN: 22 mg/dL (ref 7–25)
CALCIUM: 9.2 mg/dL (ref 8.6–10.4)
CO2: 27 mmol/L (ref 20–31)
CREATININE: 1.24 mg/dL — AB (ref 0.50–0.99)
GFR, Est African American: 51 mL/min — ABNORMAL LOW (ref >=60)
GFR, Est Non African American: 44 mL/min — ABNORMAL LOW (ref >=60)
Glucose, Bld: 104 mg/dL — ABNORMAL HIGH (ref 65–99)
Potassium: 2.7 mmol/L — CL (ref 3.5–5.3)
Sodium: 120 mmol/L — CL (ref 135–146)

## 2016-01-19 LAB — VITAMIN D 25 HYDROXY (VIT D DEFICIENCY, FRACTURES): VIT D 25 HYDROXY: 56 ng/mL (ref 30–100)

## 2016-01-19 LAB — FERRITIN: Ferritin: 101 ng/mL (ref 20–288)

## 2016-01-19 LAB — HEPATIC FUNCTION PANEL
ALBUMIN: 3.7 g/dL (ref 3.6–5.1)
ALT: 23 U/L (ref 6–29)
AST: 79 U/L — AB (ref 10–35)
Alkaline Phosphatase: 80 U/L (ref 33–130)
BILIRUBIN TOTAL: 1 mg/dL (ref 0.2–1.2)
Bilirubin, Direct: 0.3 mg/dL — ABNORMAL HIGH (ref <=0.2)
Indirect Bilirubin: 0.7 mg/dL (ref 0.2–1.2)
Total Protein: 5.5 g/dL — ABNORMAL LOW (ref 6.1–8.1)

## 2016-01-19 LAB — I-STAT ARTERIAL BLOOD GAS, ED
Acid-Base Excess: 8 mmol/L — ABNORMAL HIGH (ref 0.0–2.0)
Bicarbonate: 35.2 mEq/L — ABNORMAL HIGH (ref 20.0–24.0)
O2 SAT: 26 %
PCO2 ART: 59.6 mmHg — AB (ref 35.0–45.0)
PH ART: 7.379 (ref 7.350–7.450)
PO2 ART: 19 mmHg — AB (ref 80.0–100.0)
Patient temperature: 98.6
TCO2: 37 mmol/L (ref 0–100)

## 2016-01-19 LAB — IRON AND TIBC
%SAT: 20 % (ref 11–50)
IRON: 77 ug/dL (ref 45–160)
TIBC: 379 ug/dL (ref 250–450)
UIBC: 302 ug/dL (ref 125–400)

## 2016-01-19 LAB — BRAIN NATRIURETIC PEPTIDE: B NATRIURETIC PEPTIDE 5: 72.2 pg/mL (ref 0.0–100.0)

## 2016-01-19 LAB — I-STAT VENOUS BLOOD GAS, ED
Acid-Base Excess: 9 mmol/L — ABNORMAL HIGH (ref 0.0–2.0)
BICARBONATE: 36 meq/L — AB (ref 20.0–24.0)
O2 Saturation: 34 %
PCO2 VEN: 60.5 mmHg — AB (ref 45.0–50.0)
PH VEN: 7.382 — AB (ref 7.250–7.300)
PO2 VEN: 22 mmHg — AB (ref 31.0–45.0)
TCO2: 38 mmol/L (ref 0–100)

## 2016-01-19 LAB — MAGNESIUM
Magnesium: 1.9 mg/dL (ref 1.5–2.5)
Magnesium: 2.1 mg/dL (ref 1.7–2.4)

## 2016-01-19 LAB — GLUCOSE, CAPILLARY
GLUCOSE-CAPILLARY: 422 mg/dL — AB (ref 65–99)
Glucose-Capillary: 403 mg/dL — ABNORMAL HIGH (ref 65–99)
Glucose-Capillary: 441 mg/dL — ABNORMAL HIGH (ref 65–99)

## 2016-01-19 LAB — URINALYSIS, ROUTINE W REFLEX MICROSCOPIC
Bilirubin Urine: NEGATIVE
Glucose, UA: NEGATIVE
Hgb urine dipstick: NEGATIVE
Ketones, ur: NEGATIVE
LEUKOCYTES UA: NEGATIVE
NITRITE: NEGATIVE
PH: 7 (ref 5.0–8.0)
Protein, ur: NEGATIVE
SPECIFIC GRAVITY, URINE: 1.011 (ref 1.001–1.035)

## 2016-01-19 LAB — I-STAT TROPONIN, ED: Troponin i, poc: 0 ng/mL (ref 0.00–0.08)

## 2016-01-19 LAB — LIPID PANEL
CHOLESTEROL: 163 mg/dL (ref 125–200)
HDL: 51 mg/dL (ref >=46)
LDL Cholesterol: 76 mg/dL (ref <130)
Total CHOL/HDL Ratio: 3.2 Ratio (ref <=5.0)
Triglycerides: 181 mg/dL — ABNORMAL HIGH (ref <150)
VLDL: 36 mg/dL — ABNORMAL HIGH (ref <30)

## 2016-01-19 LAB — AMMONIA
AMMONIA: 75 umol/L — AB (ref 16–53)
AMMONIA: 46 umol/L — AB (ref 9–35)

## 2016-01-19 LAB — HEMOGLOBIN A1C
HEMOGLOBIN A1C: 6.2 % — AB (ref <5.7)
MEAN PLASMA GLUCOSE: 131 mg/dL

## 2016-01-19 LAB — TSH: TSH: 1.44 m[IU]/L

## 2016-01-19 LAB — URIC ACID: Uric Acid, Serum: 8 mg/dL — ABNORMAL HIGH (ref 2.5–7.0)

## 2016-01-19 MED ORDER — POTASSIUM CHLORIDE CRYS ER 10 MEQ PO TBCR
20.0000 meq | EXTENDED_RELEASE_TABLET | Freq: Two times a day (BID) | ORAL | Status: DC
  Administered 2016-01-19 – 2016-01-24 (×11): 20 meq via ORAL
  Filled 2016-01-19 (×15): qty 2

## 2016-01-19 MED ORDER — BISOPROLOL FUMARATE 5 MG PO TABS
10.0000 mg | ORAL_TABLET | Freq: Every day | ORAL | Status: DC
  Administered 2016-01-19 – 2016-01-24 (×6): 10 mg via ORAL
  Filled 2016-01-19 (×6): qty 2

## 2016-01-19 MED ORDER — POTASSIUM CHLORIDE CRYS ER 20 MEQ PO TBCR
40.0000 meq | EXTENDED_RELEASE_TABLET | Freq: Once | ORAL | Status: AC
  Administered 2016-01-19: 40 meq via ORAL
  Filled 2016-01-19: qty 2

## 2016-01-19 MED ORDER — DIGOXIN 0.25 MG/ML IJ SOLN: 0.2500 mg | Freq: Once | INTRAMUSCULAR | Status: DC

## 2016-01-19 MED ORDER — DEXTROSE 5 % IV SOLN
1.0000 g | INTRAVENOUS | Status: DC
  Administered 2016-01-19 – 2016-01-20 (×2): 1 g via INTRAVENOUS
  Filled 2016-01-19 (×3): qty 10

## 2016-01-19 MED ORDER — OXYCODONE-ACETAMINOPHEN 5-325 MG PO TABS
1.0000 | ORAL_TABLET | Freq: Once | ORAL | Status: AC
  Administered 2016-01-20: 1 via ORAL
  Filled 2016-01-19: qty 1

## 2016-01-19 MED ORDER — PANTOPRAZOLE SODIUM 40 MG PO TBEC
40.0000 mg | DELAYED_RELEASE_TABLET | Freq: Every day | ORAL | Status: DC
  Administered 2016-01-19 – 2016-01-24 (×6): 40 mg via ORAL
  Filled 2016-01-19 (×6): qty 1

## 2016-01-19 MED ORDER — SODIUM CHLORIDE 0.9% FLUSH
3.0000 mL | Freq: Two times a day (BID) | INTRAVENOUS | Status: DC
  Administered 2016-01-20 – 2016-01-23 (×8): 3 mL via INTRAVENOUS

## 2016-01-19 MED ORDER — SODIUM CHLORIDE 0.9 % IV SOLN
INTRAVENOUS | Status: AC
  Administered 2016-01-19: 17:00:00 via INTRAVENOUS

## 2016-01-19 MED ORDER — ALBUTEROL (5 MG/ML) CONTINUOUS INHALATION SOLN
10.0000 mg/h | INHALATION_SOLUTION | Freq: Once | RESPIRATORY_TRACT | Status: AC
  Administered 2016-01-19: 10 mg/h via RESPIRATORY_TRACT
  Filled 2016-01-19: qty 20

## 2016-01-19 MED ORDER — FERROUS SULFATE 325 (65 FE) MG PO TABS
325.0000 mg | ORAL_TABLET | Freq: Every day | ORAL | Status: DC
  Administered 2016-01-19 – 2016-01-24 (×6): 325 mg via ORAL
  Filled 2016-01-19 (×6): qty 1

## 2016-01-19 MED ORDER — METHYLPREDNISOLONE SODIUM SUCC 125 MG IJ SOLR
60.0000 mg | Freq: Four times a day (QID) | INTRAMUSCULAR | Status: DC
  Administered 2016-01-19 – 2016-01-20 (×3): 60 mg via INTRAVENOUS
  Filled 2016-01-19 (×4): qty 2

## 2016-01-19 MED ORDER — INSULIN ASPART 100 UNIT/ML ~~LOC~~ SOLN: 0.0000 [IU] | Freq: Three times a day (TID) | SUBCUTANEOUS | Status: DC

## 2016-01-19 MED ORDER — LEVALBUTEROL HCL 1.25 MG/0.5ML IN NEBU
1.2500 mg | INHALATION_SOLUTION | Freq: Once | RESPIRATORY_TRACT | Status: AC
  Administered 2016-01-19: 1.25 mg via RESPIRATORY_TRACT
  Filled 2016-01-19 (×2): qty 0.5

## 2016-01-19 MED ORDER — IPRATROPIUM BROMIDE 0.02 % IN SOLN
0.5000 mg | Freq: Four times a day (QID) | RESPIRATORY_TRACT | Status: DC
  Administered 2016-01-19: 0.5 mg via RESPIRATORY_TRACT
  Filled 2016-01-19: qty 2.5

## 2016-01-19 MED ORDER — ASPIRIN 81 MG PO CHEW
81.0000 mg | CHEWABLE_TABLET | Freq: Every day | ORAL | Status: DC
  Administered 2016-01-19 – 2016-01-24 (×6): 81 mg via ORAL
  Filled 2016-01-19 (×6): qty 1

## 2016-01-19 MED ORDER — TAMOXIFEN CITRATE 10 MG PO TABS
20.0000 mg | ORAL_TABLET | Freq: Every day | ORAL | Status: DC
  Administered 2016-01-19 – 2016-01-24 (×6): 20 mg via ORAL
  Filled 2016-01-19 (×6): qty 2

## 2016-01-19 MED ORDER — LACTULOSE 10 GM/15ML PO SOLN
20.0000 g | Freq: Two times a day (BID) | ORAL | Status: DC
  Administered 2016-01-19 – 2016-01-20 (×2): 20 g via ORAL
  Filled 2016-01-19 (×3): qty 30

## 2016-01-19 MED ORDER — IPRATROPIUM BROMIDE 0.02 % IN SOLN
0.5000 mg | Freq: Once | RESPIRATORY_TRACT | Status: AC
  Administered 2016-01-19: 0.5 mg via RESPIRATORY_TRACT
  Filled 2016-01-19: qty 2.5

## 2016-01-19 MED ORDER — SODIUM CHLORIDE 0.9 % IV BOLUS (SEPSIS)
500.0000 mL | Freq: Once | INTRAVENOUS | Status: AC
  Administered 2016-01-19: 500 mL via INTRAVENOUS

## 2016-01-19 MED ORDER — LEVALBUTEROL HCL 0.63 MG/3ML IN NEBU: 0.6300 mg | INHALATION_SOLUTION | Freq: Four times a day (QID) | RESPIRATORY_TRACT | Status: DC | PRN

## 2016-01-19 MED ORDER — METHYLPREDNISOLONE SODIUM SUCC 125 MG IJ SOLR
125.0000 mg | Freq: Once | INTRAMUSCULAR | Status: AC
  Administered 2016-01-19: 125 mg via INTRAVENOUS
  Filled 2016-01-19: qty 2

## 2016-01-19 MED ORDER — INSULIN ASPART 100 UNIT/ML ~~LOC~~ SOLN: 0.0000 [IU] | Freq: Every day | SUBCUTANEOUS | Status: DC

## 2016-01-19 MED ORDER — ALLOPURINOL 100 MG PO TABS
100.0000 mg | ORAL_TABLET | Freq: Every day | ORAL | Status: DC
  Administered 2016-01-19 – 2016-01-24 (×6): 100 mg via ORAL
  Filled 2016-01-19 (×6): qty 1

## 2016-01-19 MED ORDER — POTASSIUM CHLORIDE 10 MEQ/100ML IV SOLN
10.0000 meq | Freq: Once | INTRAVENOUS | Status: AC
  Administered 2016-01-19: 10 meq via INTRAVENOUS
  Filled 2016-01-19: qty 100

## 2016-01-19 MED ORDER — MAGNESIUM GLUCONATE 500 MG PO TABS
250.0000 mg | ORAL_TABLET | Freq: Every day | ORAL | Status: DC
  Administered 2016-01-20 – 2016-01-24 (×5): 250 mg via ORAL
  Filled 2016-01-19 (×6): qty 1

## 2016-01-19 MED ORDER — INSULIN ASPART 100 UNIT/ML ~~LOC~~ SOLN
0.0000 [IU] | Freq: Three times a day (TID) | SUBCUTANEOUS | Status: DC
  Administered 2016-01-19: 15 [IU] via SUBCUTANEOUS

## 2016-01-19 NOTE — Consult Note (Signed)
CARDIOLOGY CONSULT NOTE   Patient ID: [name] MRN: [id number] DOB/AGE: 1946/05/01 70 y.o.  Admit date: 01/19/2016  Primary Physician   Unk Pinto DAVID, MD Primary Cardiologist   Dr. Percival Spanish Reason for Consultation   Arrythymias Requesting Physician  Dr. Waldron Labs  HPI: [name] is a 70 y.o. female with a history of HTN, HL, chronic diastolic heart failure, COPD, chronic respiratory failure with hypoxia and hypercapnia on 3L oxygen 24/7, DM, cirrhosis of liver from NASH, fibromyalgia, esophageal varices, thrombocytopenia and CKD stage 3 who seen by PCP yesterday for regular follow up which was significant for Hyponatremia and hypokalemia and told to go to ED for further evaluation this morning 6/15. Upon arrival patient was severely dyspneic and wheezing and she developed a significant tachycardia on albuterol  -> changes to Xopenex. ABG shows CO2 retention. She was given IV steroid and nebulizer treatment. No improvement in present. He was also given supplement for low potassium and 500 mL fluid bolus x 2 for hyponatremia. Na 120-->125. K 2.7-->3.0. Scr 1.24-->1.35. Hgb A1c. Chest x-ray showed no acute abnormality. EKG on presentation at 7:55 AM showed sinus rhythm at rate of 77 bpm and right bundle-branch block which appears chronic compared to prior EKG. However repeat EKG 11:42 shows A. fib versus atrial tachycardia at rate of 123 bmp. Now cardiology asked for further evaluation.  For the past few days she has been feeling fatigued and weak. Last night was worse when she would not able to stand up. Admits to having worsening shortness of breath. Intermittent chest pain questionable as substernal area versus chest tightness. He has a chronic edema due to multiple factors. Symptoms at baseline. Admits to having diffuse abdominal pain and tightness. Intermittent dizziness. He uses only one pillow at night. He denies orthopnea, PND, syncope.  She was seen by Dr. Percival Spanish  09/04/2015 in clinic after abnormal echocardiogram. Which showed moderate LVH, grade 1 diastolic dysfunction, borderline elevated left ventricular filling pressure and normal Lv function to 55-60%. Plan made to treat her with conservative measures --> salt restriction, fluid restriction, compression stockings. Advised to avoid QT prolonging agents.   She has been hospitalized 3 times previously since January for multiple medical problems. She has had electrolytes abnormalities and dehydration previously while on diuretics, also thought to have a component of adrenal insufficiency coming off of steroids, admitted in January. She was in February with weakness and again in later March with confusion and elevated ammonia levels, and concern for hepatic encephalopathy. She also had pnuemonia in February.   Past Medical History  Diagnosis Date  . Hypertension   . Hyperlipidemia   . COPD (chronic obstructive pulmonary disease) ([place])   . Vitamin D deficiency   . IBS (irritable bowel syndrome)   . Fibromyalgia   . Hepatic cirrhosis ([place])   . Esophageal varices ([place])   . Hepatic encephalopathy ([place])   . Gastritis   . Splenomegaly      Past Surgical History  Procedure Laterality Date  . Hip arthroplasty Bilateral   . Shoulder surgery Right   . Back surgery    . Cholecystectomy    . Neck surgery    . Esophagogastroduodenoscopy  07/16/2012    Procedure: ESOPHAGOGASTRODUODENOSCOPY (EGD);  Surgeon: Inda Castle, MD;  Location: [facility];  Service: Endoscopy;  Laterality: N/A;  . Gastric varices banding  07/16/2012    Procedure: GASTRIC VARICES BANDING;  Surgeon: Inda Castle, MD;  Location: [facility];  Service: Endoscopy;  Laterality: N/A;  .  Abdominal hysterectomy      Allergies  Allergen Reactions  . Atorvastatin Other (See Comments)    Doesn't remember  . Diphenhydramine Hcl (Sleep) Hives  . Hydrocodone-Acetaminophen Other (See Comments)    Doesn't remember  . Lopid  [Gemfibrozil] Other (See Comments)    Doesn't remember  . Loratadine Hives  . Lorazepam Hives  . Simvastatin Other (See Comments)  . Sulfamethoxazole Hives  . Sulfonamide Derivatives Hives    I have reviewed the patient's current medications . allopurinol  100 mg Oral Daily  . aspirin  81 mg Oral Daily  . bisoprolol  10 mg Oral Daily  . cefTRIAXone (ROCEPHIN)  IV  1 g Intravenous Q24H  . ferrous sulfate  325 mg Oral Daily  . insulin aspart  0-9 Units Subcutaneous TID WC  . ipratropium  0.5 mg Nebulization Q6H  . lactulose  20 g Oral BID  . [START ON 01/20/2016] magnesium gluconate  250 mg Oral Q0600  . methylPREDNISolone (SOLU-MEDROL) injection  60 mg Intravenous Q6H  . pantoprazole  40 mg Oral Daily  . potassium chloride  20 mEq Oral BID  . potassium chloride  40 mEq Oral Once  . sodium chloride flush  3 mL Intravenous Q12H  . tamoxifen  20 mg Oral Daily   . sodium chloride     levalbuterol  Prior to Admission medications   Medication Sig Start Date End Date Taking? Authorizing Provider  allopurinol (ZYLOPRIM) 300 MG tablet 1/2-1 tablet daily for gout 01/03/16  Yes Unk Pinto, MD  aspirin 81 MG tablet Take 81 mg by mouth daily.   Yes Historical Provider, MD  bisoprolol (ZEBETA) 10 MG tablet Take 1 tablet (10 mg total) by mouth daily. 09/13/15  Yes Unk Pinto, MD  citalopram (CELEXA) 40 MG tablet Take 40 mg by mouth 2 (two) times daily.   Yes Historical Provider, MD  cyclobenzaprine (FLEXERIL) 10 MG tablet Take 1 tablet (10 mg total) by mouth 3 (three) times daily as needed for muscle spasms. 11/17/14  Yes Courtney Forcucci, PA-C  diclofenac sodium (VOLTAREN) 1 % GEL Apply 1 application topically 4 (four) times daily as needed. pain 01/16/16  Yes Historical Provider, MD  ferrous sulfate 325 (65 FE) MG tablet TAKE 1 TABLET TWICE DAILY Patient taking differently: TAKE 1 TABLET ONCE DAILY 01/19/13  Yes Inda Castle, MD  furosemide (LASIX) 80 MG tablet Take 80 mg by mouth 2  (two) times daily.    Yes Historical Provider, MD  gabapentin (NEURONTIN) 300 MG capsule take 1 capsule by mouth in the morning, 1 capsule in the afternoon and 1-3 capsules at night Patient taking differently: Take 300 mg by mouth 3 (three) times daily.  09/27/15  Yes Vicie Mutters, PA-C  ipratropium (ATROVENT) 0.02 % nebulizer solution use 1 vial in nebulizer EVERY 4 HOURS AS NEEDED FOR WHEEZING OR SHORTNESS OF BREATH 08/24/15  Yes Unk Pinto, MD  lactulose (CHRONULAC) 10 GM/15ML solution Take 30 ml (1 ounce) 2 x day Patient taking differently: Take 20 g by mouth 2 (two) times daily.  10/05/15 10/04/16 Yes Unk Pinto, MD  Magnesium 250 MG TABS Take 250 mg by mouth daily at 6 (six) AM.   Yes Historical Provider, MD  metFORMIN (GLUCOPHAGE) 500 MG tablet Take 1,000 mg by mouth 2 (two) times daily.    Yes Historical Provider, MD  metolazone (ZAROXOLYN) 5 MG tablet Take 2.5 mg by mouth 3 (three) times a week. Wed, Fri and Sun   Yes Historical Provider, MD  ondansetron (ZOFRAN) 4 MG tablet Take 1 tablet (4 mg total) by mouth daily as needed for nausea or vomiting. 10/06/15 10/05/16 Yes Vicie Mutters, PA-C  OXYGEN Inhale 3 L/min into the lungs daily as needed (On exertion and at home).    Yes Historical Provider, MD  pantoprazole (PROTONIX) 40 MG tablet Take 1 tablet (40 mg total) by mouth 2 (two) times daily. 12/12/15  Yes Unk Pinto, MD  potassium chloride (K-DUR) 10 MEQ tablet Take 2 tablets (20 mEq total) by mouth daily. Patient taking differently: take 1 tab (67mq) by mouth twice daily 10/23/15  Yes AVicie Mutters PA-C  spironolactone (ALDACTONE) 25 MG tablet TAKE 1 TABLET BY MOUTH THREE TIMES DAILY FOR FLUIDS AND SWELLING Patient taking differently: Take 25 mg by mouth daily.  11/15/15  Yes WUnk Pinto MD  tamoxifen (NOLVADEX) 20 MG tablet Take 1 tablet (20 mg total) by mouth daily. 11/15/15  Yes WUnk Pinto MD  traMADol (ULTRAM) 50 MG tablet take 1 TABLET BY MOUTH EVERY 8  HOURS Patient taking differently: take 1 TABLET BY MOUTH EVERY 8 HOURS AS NEEDED FOR PAIN 12/28/15  Yes Courtney Forcucci, PA-C  azithromycin (ZITHROMAX) 250 MG tablet Take 2 tablets (500 mg) on  Day 1,  followed by 1 tablet (250 mg) once daily on Days 2 through 5. 01/18/16 01/23/16  AVicie Mutters PA-C     Social History   Social History  . Marital Status: Married    Spouse Name: JJeneen Rinks . Number of Children: 5  . Years of Education: N/A   Occupational History  . Housewife    Social History Main Topics  . Smoking status: Former Smoker -- 1.00 packs/day for 50 years    Types: Cigarettes    Quit date: 11/05/2014  . Smokeless tobacco: Never Used     Comment: Quit April 2016  . Alcohol Use: No  . Drug Use: No  . Sexual Activity: Not on file   Other Topics Concern  . Not on file   Social History Narrative    No family status information on file.   Family History  Problem Relation Age of Onset  . Colon cancer Neg Hx   . Diabetes Brother     deceased  . Heart disease Sister     A Fib  . Heart disease Mother     CHF  . Atrial fibrillation Sister       ROS:  Full 14 point review of systems complete and found to be negative unless listed above.  Physical Exam: Blood pressure 102/48, pulse 159, resp. rate 14, SpO2 95 %.  General: Well developed, well nourished, female in no acute distress. On nasal cannula. Head: Eyes PERRLA, No xanthomas. Normocephalic and atraumatic, oropharynx without edema or exudate.  Lungs: Resp regular and unlabored. Diminished breath sounds throughout with diffuse wheezing. Heart: irregular tachycaric no s3, s4, or murmurs..   Neck: No carotid bruits. No lymphadenopathy. No  JVD. Abdomen: Bowel sounds present, Distended and tender.  Msk:  No spine or cva tenderness. No weakness, no joint deformities or effusions. Extremities: No clubbing, cyanosis. Trace edema with vascular changes.  DP/PT/Radials 2+ and equal bilaterally. Neuro: Alert and  oriented X 3. No focal deficits noted. Psych:  Good affect, responds appropriately Skin: No rashes or lesions noted.  Labs:   Lab Results  Component Value Date   WBC 5.5 01/19/2016   HGB 9.6* 01/19/2016   HCT 28.5* 01/19/2016   MCV 96.9 01/19/2016   PLT 107* 01/19/2016  No results for input(s): INR in the last 72 hours.   Recent Labs Lab 01/19/16 0820  NA 125*  K 3.0*  CL 74*  CO2 34*  BUN 20  CREATININE 1.35*  CALCIUM 9.6  PROT 5.4*  BILITOT 1.1  ALKPHOS 77  ALT 26  AST 110*  GLUCOSE 142*  ALBUMIN 3.2*   MAGNESIUM  Date Value Ref Range Status  01/19/2016 2.1 1.7 - 2.4 mg/dL Final   No results for input(s): CKTOTAL, CKMB, TROPONINI in the last 72 hours.  Recent Labs  01/19/16 0830  TROPIPOC 0.00   PRO B NATRIURETIC PEPTIDE (BNP)  Date/Time Value Ref Range Status  10/23/2008 04:02 PM <30.0 0.0 - 100.0 pg/mL Final  10/22/2008 12:37 PM <30.0 0.0 - 100.0 pg/mL Final   Lab Results  Component Value Date   CHOL 163 01/18/2016   HDL 51 01/18/2016   LDLCALC 76 01/18/2016   TRIG 181* 01/18/2016   Lab Results  Component Value Date   DDIMER 0.39 12/17/2014   LIPASE  Date/Time Value Ref Range Status  10/29/2015 07:30 PM 71* 11 - 51 U/L Final   TSH  Date/Time Value Ref Range Status  01/18/2016 11:34 AM 1.44 mIU/L Final    Comment:      Reference Range   > or = 20 Years  0.40-4.50   Pregnancy Range First trimester  0.26-2.66 Second trimester 0.55-2.73 Third trimester  0.43-2.91     09/22/2015 05:23 AM 1.152 0.350 - 4.500 uIU/mL Final   VITAMIN B-12  Date/Time Value Ref Range Status  09/22/2015 05:50 AM 846 180 - 914 pg/mL Final    Comment:    (NOTE) This assay is not validated for testing neonatal or myeloproliferative syndrome specimens for Vitamin B12 levels.    FERRITIN  Date/Time Value Ref Range Status  01/18/2016 11:34 AM 101 20 - 288 ng/mL Final   TIBC  Date/Time Value Ref Range Status  01/18/2016 11:34 AM 379 250 - 450 ug/dL  Final   IRON  Date/Time Value Ref Range Status  01/18/2016 11:34 AM 77 45 - 160 ug/dL Final   RETIC CT PCT  Date/Time Value Ref Range Status  09/22/2015 05:50 AM 3.6* 0.4 - 3.1 % Final    Echo: 08/08/15  LV EF: 55% - 60%  ------------------------------------------------------------------- Indications: Dyspnea 786.09.  ------------------------------------------------------------------- History: PMH: Sleep Apnea. Bilateral Lower Extremity Edema. Chronic obstructive pulmonary disease. Risk factors: Hypertension. Dyslipidemia.  ------------------------------------------------------------------- Study Conclusions  - Procedure narrative: Transthoracic echocardiography. Image  quality was suboptimal. The study was technically difficult, as a  result of poor acoustic windows and poor sound wave transmission. - Left ventricle: The cavity size was normal. Wall thickness was  increased in a pattern of moderate LVH. Systolic function was  normal. The estimated ejection fraction was in the range of 55%  to 60%. Doppler parameters are consistent with abnormal left  ventricular relaxation (grade 1 diastolic dysfunction). The E/e&'  ratio is between 8-15, suggesting indeterminate LV filling  pressure. - Left atrium: The atrium was normal in size.  Impressions:  - Technically difficult study. LVEF 55-60%, moderate LVH, diastolic  dysfunction, borderline elevated LV filling pressure, normal LA  size.   ECG:  EKG on presentation at 7:55 AM showed sinus rhythm at rate of 77 bpm and right bundle-branch block which appears chronic compared to prior EKG. However repeat EKG 11:42 shows A. fib versus atrial tachycardia at rate of 123 bmp.   Radiology:  Dg Chest Port 1 View  01/19/2016  CLINICAL  DATA:  Shortness of breath and weakness for 2 days, history hypertension, former smoker, COPD, fibromyalgia, cirrhosis EXAM: PORTABLE CHEST 1 VIEW COMPARISON:  Portable exam  0937 hours compared to 09/21/2015 FINDINGS: Normal heart size, mediastinal contours, and pulmonary vascularity. Lungs clear. No pleural effusion or pneumothorax. Prior cervical spine fusion and RIGHT shoulder arthroplasty. IMPRESSION: No acute abnormalities. Electronically Signed   By: Lavonia Dana M.D.   On: 01/19/2016 08:48    ASSESSMENT AND PLAN:     1. Arrhythmia - Arrhythmia started after given albuterol in ED. Concerning for A. fib versus atrial tachycardia. Denies any palpitation or skipping beat. Rate in 120s. Will review with MD. Continue Bisopolol.   2. Prolonged QT - Correct any electrolyte abnormality. Avoid QT prolonging agent.  3. Chronic diastolic heart failure - Appears euvolemic. Held diuretics due to electrolyte abnormality.  4. Acute on chronic respiratory failure with hypoxia and hypercapnia - Secondary to COPD exacerbation and acute bronchitis. On IV steroid, nebulizer treatment and abx . Avoid albuterol to prevent tachycardia.   5. Hypokalemia and hyponatremia - Improved. On supplement. Mg 2.1.Her electrolytes abnormalities and dehydration previously while on diuretics, also thought to have a component of adrenal insufficiency coming off of steroids.    Signed: Minus Breeding, MD 01/19/2016, 4:10 PM Pager [phone]  Co-Sign MD  History and all data above reviewed.  Patient examined.  I agree with the findings as above. The patient presented primarily with weakness and is admitted with hypokalemia and hyponatremia.    The patient exam reveals XAJ:OINOMVEHM  ,  Lungs: Diffuse wheezing  ,  Abd:  Distended, Ext Diffuse edema  .  All available labs, radiology testing, previous records reviewed. Agree with documented assessment and plan. Arrhythmia: Atrial fib.  Rate control will need to center around improved breathing and correction of her acute underlying problems.  She would be very reluctant to take anticoagulation as she has liver disease and low platelets with easy  bruising .  I will discuss this further but agree she would be high risk for chronic anticoagulation.    [name] Ritu Gagliardo  4:23 PM  01/19/2016

## 2016-01-19 NOTE — H&P (Signed)
TRH H&P   Patient Demographics:    [name], is a 70 y.o. female  MRN: [id number]   DOB - 1945-09-30  Admit Date - 01/19/2016  Outpatient Primary MD for the patient is Alesia Richards, MD  Referring MD/NP/PA: Dr.Nguyen  Outpatient Specialists: Cardiology Dr. Telford Nab, GI Dr. Havery Moros, pulmonary doctor worked  Patient coming from:Home  Chief Complaint  Patient presents with  . Shortness of Breath      HPI:    [name]  is a 70 y.o. female,with a past medical history significant for NASH cirrhosis with varices, HTN, fibromyalgia, CKD, and COPD on chronic home O2 who presents with shortness of breath and weakness, she was at PCP office yesterday, blood work was significant for hyponatremia and hypokalemia, hold by them to come to ED, but patient reports she does present secondary to her dyspnea, reports worsening over last 2 days, as well reports some cough, started on azithromycin by her PCP yesterday with no much help, in ED patient was significant dyspnea, wheezing on presentation, she developed significant tachycardia to albuterol, currently on Xopenex, ABG showing CO2 retention, she denies any fever, chills, nausea, vomiting, hemoptysis, worsening leg edema, significant improvement after receiving IV Solu-Medrol, lab significant for improving hyponatremia, with sodium 125, improving hypokalemia with potassium of 3, Thrombocytopenia at baseline , platlet of 107K.    Review of systems:    In addition to the HPI above,  No Fever-chills, No Headache, No changes with Vision or hearing, No problems swallowing food or Liquids, No Chest pain, reports Cough And worsening Shortness of Breath, No Abdominal pain, No Nausea or Vommitting, Bowel movements are regular, No Blood in stool or Urine, No dysuria, No new skin rashes or bruises, No new joints pains-aches,  No  new focal weakness, tingling, numbness in any extremity, but reports generalized weakness and fatigue No recent weight gain or loss, No polyuria, polydypsia or polyphagia, No significant Mental Stressors.  A full 10 point Review of Systems was done, except as stated above, all other Review of Systems were negative.   With Past History of the following :    Past Medical History  Diagnosis Date  . Hx of shoulder replacement   . Hypertension   . Hyperlipidemia   . COPD (chronic obstructive pulmonary disease) ([place])   . Vitamin D deficiency   . IBS (irritable bowel syndrome)   . Fibromyalgia   . Hepatic cirrhosis ([place])   . Esophageal varices ([place])   . Hepatic encephalopathy ([place])   . Gastritis   . Esophageal varices ([place])   . Splenomegaly       Past Surgical History  Procedure Laterality Date  . Hip arthroplasty Bilateral   . Shoulder surgery Right   . Back surgery    . Cholecystectomy    . Neck surgery    . Esophagogastroduodenoscopy  07/16/2012    Procedure: ESOPHAGOGASTRODUODENOSCOPY (EGD);  Surgeon: Inda Castle, MD;  Location: [facility];  Service: Endoscopy;  Laterality: N/A;  . Gastric varices banding  07/16/2012    Procedure: GASTRIC VARICES BANDING;  Surgeon: Inda Castle, MD;  Location: [facility];  Service: Endoscopy;  Laterality: N/A;      Social History:     Social History  Substance Use Topics  . Smoking status: Former Smoker -- 1.00 packs/day for 50 years    Types: Cigarettes    Quit date: 11/05/2014  . Smokeless tobacco: Never Used     Comment: Quit April 2016  . Alcohol Use: No     Lives - At home  Mobility - independent     Family History :     Family History  Problem Relation Age of Onset  . Colon cancer Neg Hx   . Diabetes Brother     deceased  . Heart disease Sister     A Fib  . Heart disease Mother     CHF  . Atrial fibrillation Sister       Home Medications:   Prior to Admission medications   Medication Sig  Start Date End Date Taking? Authorizing Provider  allopurinol (ZYLOPRIM) 300 MG tablet 1/2-1 tablet daily for gout 01/03/16  Yes Unk Pinto, MD  aspirin 81 MG tablet Take 81 mg by mouth daily.   Yes Historical Provider, MD  bisoprolol (ZEBETA) 10 MG tablet Take 1 tablet (10 mg total) by mouth daily. 09/13/15  Yes Unk Pinto, MD  citalopram (CELEXA) 40 MG tablet Take 40 mg by mouth 2 (two) times daily.   Yes Historical Provider, MD  cyclobenzaprine (FLEXERIL) 10 MG tablet Take 1 tablet (10 mg total) by mouth 3 (three) times daily as needed for muscle spasms. 11/17/14  Yes Courtney Forcucci, PA-C  diclofenac sodium (VOLTAREN) 1 % GEL Apply 1 application topically 4 (four) times daily as needed. pain 01/16/16  Yes Historical Provider, MD  ferrous sulfate 325 (65 FE) MG tablet TAKE 1 TABLET TWICE DAILY Patient taking differently: TAKE 1 TABLET ONCE DAILY 01/19/13  Yes Inda Castle, MD  furosemide (LASIX) 80 MG tablet Take 80 mg by mouth 2 (two) times daily.    Yes Historical Provider, MD  gabapentin (NEURONTIN) 300 MG capsule take 1 capsule by mouth in the morning, 1 capsule in the afternoon and 1-3 capsules at night Patient taking differently: Take 300 mg by mouth 3 (three) times daily.  09/27/15  Yes Vicie Mutters, PA-C  ipratropium (ATROVENT) 0.02 % nebulizer solution use 1 vial in nebulizer EVERY 4 HOURS AS NEEDED FOR WHEEZING OR SHORTNESS OF BREATH 08/24/15  Yes Unk Pinto, MD  lactulose (CHRONULAC) 10 GM/15ML solution Take 30 ml (1 ounce) 2 x day Patient taking differently: Take 20 g by mouth 2 (two) times daily.  10/05/15 10/04/16 Yes Unk Pinto, MD  Magnesium 250 MG TABS Take 250 mg by mouth daily at 6 (six) AM.   Yes Historical Provider, MD  metFORMIN (GLUCOPHAGE) 500 MG tablet Take 1,000 mg by mouth 2 (two) times daily.    Yes Historical Provider, MD  metolazone (ZAROXOLYN) 5 MG tablet Take 2.5 mg by mouth 3 (three) times a week. Wed, Fri and Sun   Yes Historical Provider, MD    ondansetron (ZOFRAN) 4 MG tablet Take 1 tablet (4 mg total) by mouth daily as needed for nausea or vomiting. 10/06/15 10/05/16 Yes Vicie Mutters, PA-C  OXYGEN Inhale 3 L/min into the lungs daily as needed (On exertion and at  home).    Yes Historical Provider, MD  pantoprazole (PROTONIX) 40 MG tablet Take 1 tablet (40 mg total) by mouth 2 (two) times daily. 12/12/15  Yes Unk Pinto, MD  potassium chloride (K-DUR) 10 MEQ tablet Take 2 tablets (20 mEq total) by mouth daily. Patient taking differently: take 1 tab (17mq) by mouth twice daily 10/23/15  Yes AVicie Mutters PA-C  spironolactone (ALDACTONE) 25 MG tablet TAKE 1 TABLET BY MOUTH THREE TIMES DAILY FOR FLUIDS AND SWELLING Patient taking differently: Take 25 mg by mouth daily.  11/15/15  Yes WUnk Pinto MD  tamoxifen (NOLVADEX) 20 MG tablet Take 1 tablet (20 mg total) by mouth daily. 11/15/15  Yes WUnk Pinto MD  traMADol (ULTRAM) 50 MG tablet take 1 TABLET BY MOUTH EVERY 8 HOURS Patient taking differently: take 1 TABLET BY MOUTH EVERY 8 HOURS AS NEEDED FOR PAIN 12/28/15  Yes Courtney Forcucci, PA-C  azithromycin (ZITHROMAX) 250 MG tablet Take 2 tablets (500 mg) on  Day 1,  followed by 1 tablet (250 mg) once daily on Days 2 through 5. 01/18/16 01/23/16  AVicie Mutters PA-C     Allergies:     Allergies  Allergen Reactions  . Atorvastatin Other (See Comments)    Doesn't remember  . Diphenhydramine Hcl (Sleep) Hives  . Hydrocodone-Acetaminophen Other (See Comments)    Doesn't remember  . Lopid [Gemfibrozil] Other (See Comments)    Doesn't remember  . Loratadine Hives  . Lorazepam Hives  . Simvastatin Other (See Comments)  . Sulfamethoxazole Hives  . Sulfonamide Derivatives Hives     Physical Exam:   Vitals  Blood pressure 108/57, pulse 135, resp. rate 11, SpO2 95 %.   1. General Frail elderly female lying in bed in NAD,   2. Normal affect and insight, Not Suicidal or Homicidal, Awake Alert, Oriented X 3.  3. No F.N  deficits, ALL C.Nerves Intact, Strength 5/5 all 4 extremities, Sensation intact all 4 extremities, Plantars down going.  4. Ears and Eyes appear Normal, Conjunctivae clear, PERRLA. Moist Oral Mucosa.  5. Supple Neck, No JVD, No cervical lymphadenopathy appriciated, No Carotid Bruits.  6. Symmetrical Chest wall movement, diminished air movement bilaterally, scattered wheezing.  7. Irregular irregular, tachycardic, No Gallops, Rubs or Murmurs, No Parasternal Heave.  8. Positive Bowel Sounds, Abdomen Soft, No tenderness, No organomegaly appriciated,No rebound -guarding or rigidity.  9.  No Cyanosis, Normal Skin Turgor, No Skin Rash or Bruise.  10. Good muscle tone,  joints appear normal , no effusions, chronic lower extremity skin changes.  11. No Palpable Lymph Nodes in Neck or Axillae     Data Review:    CBC  Recent Labs Lab 01/18/16 1134 01/19/16 0820  WBC 5.4 5.5  HGB 9.8* 9.6*  HCT 28.5* 28.5*  PLT 97* 107*  MCV 96.0 96.9  MCH 33.0 32.7  MCHC 34.4 33.7  RDW 15.5* 14.4  LYMPHSABS 702* 1.1  MONOABS 378 0.3  EOSABS 216 0.3  BASOSABS 0 0.0   ------------------------------------------------------------------------------------------------------------------  Chemistries   Recent Labs Lab 01/18/16 1134 01/19/16 0820  NA 120* 125*  K 2.7* 3.0*  CL <80* 74*  CO2 27 34*  GLUCOSE 104* 142*  BUN 22 20  CREATININE 1.24* 1.35*  CALCIUM 9.2 9.6  MG 1.9  --   AST 79* 110*  ALT 23 26  ALKPHOS 80 77  BILITOT 1.0 1.1   ------------------------------------------------------------------------------------------------------------------ estimated creatinine clearance is 38.9 mL/min (by C-G formula based on Cr of 1.35). ------------------------------------------------------------------------------------------------------------------  Recent Labs  01/18/16 1134  TSH 1.44    Coagulation profile No results for input(s): INR, PROTIME in the last 168  hours. ------------------------------------------------------------------------------------------------------------------- No results for input(s): DDIMER in the last 72 hours. -------------------------------------------------------------------------------------------------------------------  Cardiac Enzymes No results for input(s): CKMB, TROPONINI, MYOGLOBIN in the last 168 hours.  Invalid input(s): CK ------------------------------------------------------------------------------------------------------------------    Component Value Date/Time   BNP 72.2 01/19/2016 0820   BNP 43.2 12/17/2014 1204     ---------------------------------------------------------------------------------------------------------------  Urinalysis    Component Value Date/Time   COLORURINE YELLOW 01/18/2016 1134   APPEARANCEUR CLEAR 01/18/2016 1134   LABSPEC 1.011 01/18/2016 1134   PHURINE 7.0 01/18/2016 1134   GLUCOSEU NEGATIVE 01/18/2016 1134   HGBUR NEGATIVE 01/18/2016 1134   [place] 01/18/2016 1134   KETONESUR NEGATIVE 01/18/2016 1134   PROTEINUR NEGATIVE 01/18/2016 1134   UROBILINOGEN 1 01/13/2015 1145   NITRITE NEGATIVE 01/18/2016 1134   LEUKOCYTESUR NEGATIVE 01/18/2016 1134    ----------------------------------------------------------------------------------------------------------------   Imaging Results:    Dg Chest Port 1 View  01/19/2016  CLINICAL DATA:  Shortness of breath and weakness for 2 days, history hypertension, former smoker, COPD, fibromyalgia, cirrhosis EXAM: PORTABLE CHEST 1 VIEW COMPARISON:  Portable exam 0937 hours compared to 09/21/2015 FINDINGS: Normal heart size, mediastinal contours, and pulmonary vascularity. Lungs clear. No pleural effusion or pneumothorax. Prior cervical spine fusion and RIGHT shoulder arthroplasty. IMPRESSION: No acute abnormalities. Electronically Signed   By: Lavonia Dana M.D.   On: 01/19/2016 08:48    My personal review of EKG: Rhythm  Regular, Rate  123 /min, QTc 558 , no Acute ST changes   Assessment & Plan:    Active Problems:   Anemia   Hepatic cirrhosis (HCC)   GERD   Thrombocytopenia (HCC)   CKD stage 3 due to type 2 diabetes mellitus (HCC)   Hypokalemia   DM type 2 causing CKD stage 3 (HCC)   COPD exacerbation (HCC)   Acute on chronic respiratory failure (HCC)   Acute on chronic respiratory failure, hypoxic and hypercapnic - Patient on baseline nasal cannula 2 L nasal cannula at home, presents with dyspnea - Secondary to COPD exacerbation and acute bronchitis  COPD exacerbation - Continue IV steroids, we'll continue with ipratropium, will continue Xopenex, no albuterol given significant tachycardia in ED with albuterol - continue with Rocephin for acute bronchitis, no azithromycin or levofloxacin given prolonged QTC  Hyponatremia - Secondary to volume depletion, continue to hold diuresis, continue with IV fluids   hypokalemia - Repleted, check magnesium  CKD stage III - appears at baseline, but patient is clinically volume depleted,Continue to hold diuresis, continue with gentle hydration  Arrhythmia - Patient rhythm on monitor questionable A. Fib versus MAT, iscussed with patient's primary cardiologist Dr. Telford Nab will continue to monitor  prolonged QTC - We'll correct hypokalemia, will check magnesium, will monitor on telemetry, recheck electrolytes in a.m., hold azithromycin and citalopram for now  Cirrhosis from [place] - Continue to hold Lasix and Aldactone today, will restart tomorrow - Ammonia slightly on the higher side, but mentation at baseline, continue with home dose lactulose  Hypertension - continue with bisoprolol  chronic diastolic CHF - appears to be compensated, actually on the dry side, and tinea to monitor closely as on IV fluids  Diabetes mellitus - We'll hold metformin metformin, will start an insulin sliding scale   Thrombocytopenia - Secondary to liver cirrhosis,  continue to monitor CBC  Gout - Continue with allopurinol   DVT Prophylaxis  SCDs   AM Labs Ordered, also  please review Full Orders  Family Communication: Admission, patients condition and plan of care including tests being ordered have been discussed with the patient and husband who indicate understanding and agree with the plan and Code Status.  Code Status full  Likely DC to  Home  Condition GUARDED    Consults called: D/W cardiology Dr Telford Nab  Admission status: inpatient  Time spent in minutes : 65 minutes   Esias Mory M.D on 01/19/2016 at 11:56 AM  Between 7am to 7pm - Pager - [phone]. After 7pm go to [email or web address] - password [facility]  [facility] - Office  [phone]

## 2016-01-19 NOTE — ED Notes (Signed)
Dr. Alfonse Spruce aware of fluctuations in BP, 550m bolus to be ordered. Pt A&O, breathing is less labored than arrival.  Patient repositioned.

## 2016-01-19 NOTE — ED Notes (Signed)
Called pharmacy regarding delay in [place] delivery.

## 2016-01-19 NOTE — ED Notes (Signed)
Pt presents with shortness of breath onset last night.  Husband at bedside and states did not give any home meds throughout the night/this morning.  Patient speaking in short phrases, audible wheezes, lungs significantly diminished throughout, abdomen distended and taught.  Hx COPD and Cirrhosis.

## 2016-01-19 NOTE — ED Provider Notes (Signed)
CSN: [id number]     Arrival date & time 01/19/16  0747 History   First MD Initiated Contact with Patient 01/19/16 [phone]     Chief Complaint  Patient presents with  . Shortness of Breath     (Consider location/radiation/quality/duration/timing/severity/associated sxs/prior Treatment) HPI Comments: 70 year old female with, located past medical history including liver cirrhosis, COPD presents for shortness of breath. The patient and her husband state that yesterday the patient had a physical examination and was sent for laboratory studies. They were called last night and told that her sodium and potassium were very low. At that time the patient had felt okay but then overnight she became increasingly short of breath with increasing audible wheezes. She denies fever. She has had a mild cough.  Patient is a 70 y.o. female presenting with shortness of breath.  Shortness of Breath Associated symptoms: cough and wheezing   Associated symptoms: no abdominal pain, no chest pain, no fever, no headaches, no rash and no vomiting     Past Medical History  Diagnosis Date  . Hx of shoulder replacement   . Hypertension   . Hyperlipidemia   . COPD (chronic obstructive pulmonary disease) ([place])   . Vitamin D deficiency   . IBS (irritable bowel syndrome)   . Fibromyalgia   . Hepatic cirrhosis ([place])   . Esophageal varices ([place])   . Hepatic encephalopathy ([place])   . Gastritis   . Esophageal varices ([place])   . Splenomegaly    Past Surgical History  Procedure Laterality Date  . Hip arthroplasty Bilateral   . Shoulder surgery Right   . Back surgery    . Cholecystectomy    . Neck surgery    . Esophagogastroduodenoscopy  07/16/2012    Procedure: ESOPHAGOGASTRODUODENOSCOPY (EGD);  Surgeon: Inda Castle, MD;  Location: [facility];  Service: Endoscopy;  Laterality: N/A;  . Gastric varices banding  07/16/2012    Procedure: GASTRIC VARICES BANDING;  Surgeon: Inda Castle, MD;  Location: [facility];   Service: Endoscopy;  Laterality: N/A;   Family History  Problem Relation Age of Onset  . Colon cancer Neg Hx   . Diabetes Brother     deceased  . Heart disease Sister     A Fib  . Heart disease Mother     CHF  . Atrial fibrillation Sister    Social History  Substance Use Topics  . Smoking status: Former Smoker -- 1.00 packs/day for 50 years    Types: Cigarettes    Quit date: 11/05/2014  . Smokeless tobacco: Never Used     Comment: Quit April 2016  . Alcohol Use: No   OB History    No data available     Review of Systems  Constitutional: Positive for fatigue. Negative for fever and chills.  HENT: Negative for congestion, postnasal drip, rhinorrhea and sinus pressure.   Eyes: Negative for visual disturbance.  Respiratory: Positive for cough, shortness of breath and wheezing. Negative for chest tightness.   Cardiovascular: Positive for leg swelling. Negative for chest pain and palpitations.  Gastrointestinal: Positive for abdominal distention. Negative for nausea, vomiting, abdominal pain, diarrhea and constipation.  Genitourinary: Negative for dysuria, urgency and frequency.  Musculoskeletal: Negative for myalgias and back pain.  Skin: Negative for rash.  Neurological: Negative for dizziness, weakness, light-headedness and headaches.  Hematological: Does not bruise/bleed easily.      Allergies  Atorvastatin; Diphenhydramine hcl (sleep); Hydrocodone-acetaminophen; Lopid; Loratadine; Lorazepam; Simvastatin; Sulfamethoxazole; and Sulfonamide derivatives  Home Medications  Prior to Admission medications   Medication Sig Start Date End Date Taking? Authorizing Provider  allopurinol (ZYLOPRIM) 300 MG tablet 1/2-1 tablet daily for gout 01/03/16  Yes Unk Pinto, MD  aspirin 81 MG tablet Take 81 mg by mouth daily.   Yes Historical Provider, MD  bisoprolol (ZEBETA) 10 MG tablet Take 1 tablet (10 mg total) by mouth daily. 09/13/15  Yes Unk Pinto, MD  citalopram  (CELEXA) 40 MG tablet Take 40 mg by mouth 2 (two) times daily.   Yes Historical Provider, MD  cyclobenzaprine (FLEXERIL) 10 MG tablet Take 1 tablet (10 mg total) by mouth 3 (three) times daily as needed for muscle spasms. 11/17/14  Yes Courtney Forcucci, PA-C  diclofenac sodium (VOLTAREN) 1 % GEL Apply 1 application topically 4 (four) times daily as needed. pain 01/16/16  Yes Historical Provider, MD  ferrous sulfate 325 (65 FE) MG tablet TAKE 1 TABLET TWICE DAILY Patient taking differently: TAKE 1 TABLET ONCE DAILY 01/19/13  Yes Inda Castle, MD  furosemide (LASIX) 80 MG tablet Take 80 mg by mouth 2 (two) times daily.    Yes Historical Provider, MD  gabapentin (NEURONTIN) 300 MG capsule take 1 capsule by mouth in the morning, 1 capsule in the afternoon and 1-3 capsules at night Patient taking differently: Take 300 mg by mouth 3 (three) times daily.  09/27/15  Yes Vicie Mutters, PA-C  ipratropium (ATROVENT) 0.02 % nebulizer solution use 1 vial in nebulizer EVERY 4 HOURS AS NEEDED FOR WHEEZING OR SHORTNESS OF BREATH 08/24/15  Yes Unk Pinto, MD  lactulose (CHRONULAC) 10 GM/15ML solution Take 30 ml (1 ounce) 2 x day Patient taking differently: Take 20 g by mouth 2 (two) times daily.  10/05/15 10/04/16 Yes Unk Pinto, MD  Magnesium 250 MG TABS Take 250 mg by mouth daily at 6 (six) AM.   Yes Historical Provider, MD  metFORMIN (GLUCOPHAGE) 500 MG tablet Take 1,000 mg by mouth 2 (two) times daily.    Yes Historical Provider, MD  metolazone (ZAROXOLYN) 5 MG tablet Take 2.5 mg by mouth 3 (three) times a week. Wed, Fri and Sun   Yes Historical Provider, MD  ondansetron (ZOFRAN) 4 MG tablet Take 1 tablet (4 mg total) by mouth daily as needed for nausea or vomiting. 10/06/15 10/05/16 Yes Vicie Mutters, PA-C  OXYGEN Inhale 3 L/min into the lungs daily as needed (On exertion and at home).    Yes Historical Provider, MD  pantoprazole (PROTONIX) 40 MG tablet Take 1 tablet (40 mg total) by mouth 2 (two) times  daily. 12/12/15  Yes Unk Pinto, MD  potassium chloride (K-DUR) 10 MEQ tablet Take 2 tablets (20 mEq total) by mouth daily. Patient taking differently: take 1 tab (3mq) by mouth twice daily 10/23/15  Yes AVicie Mutters PA-C  spironolactone (ALDACTONE) 25 MG tablet TAKE 1 TABLET BY MOUTH THREE TIMES DAILY FOR FLUIDS AND SWELLING Patient taking differently: Take 25 mg by mouth daily.  11/15/15  Yes WUnk Pinto MD  tamoxifen (NOLVADEX) 20 MG tablet Take 1 tablet (20 mg total) by mouth daily. 11/15/15  Yes WUnk Pinto MD  traMADol (ULTRAM) 50 MG tablet take 1 TABLET BY MOUTH EVERY 8 HOURS Patient taking differently: take 1 TABLET BY MOUTH EVERY 8 HOURS AS NEEDED FOR PAIN 12/28/15  Yes Courtney Forcucci, PA-C  azithromycin (ZITHROMAX) 250 MG tablet Take 2 tablets (500 mg) on  Day 1,  followed by 1 tablet (250 mg) once daily on Days 2 through 5. 01/18/16 01/23/16  AEstill Bamberg  Silverio Lay, PA-C  hydrocortisone (ANUSOL-HC) 2.5 % rectal cream Apply rectally 2 times daily Patient not taking: Reported on 01/19/2016 12/26/15 12/25/16  Loma Sousa Forcucci, PA-C  rifaximin (XIFAXAN) 550 MG TABS tablet Take 1 tablet (550 mg total) by mouth 2 (two) times daily. If lactulose is not working Patient not taking: Reported on 01/19/2016 12/27/15   Manus Gunning, MD  triamcinolone cream (KENALOG) 0.1 % Apply 1 application topically 3 (three) times daily. Patient not taking: Reported on 01/19/2016 12/26/15   Courtney Forcucci, PA-C   BP 102/31 mmHg  Pulse 106  Resp 15  SpO2 94% Physical Exam  Constitutional: She is oriented to person, place, and time. She appears distressed (respiratory).  HENT:  Head: Normocephalic and atraumatic.  Right Ear: External ear normal.  Left Ear: External ear normal.  Eyes: EOM are normal. Pupils are equal, round, and reactive to light.  Neck: Normal range of motion. Neck supple.  Cardiovascular: Normal rate, regular rhythm and intact distal pulses.   Pulmonary/Chest: She is in  respiratory distress. She has wheezes.  Abdominal: She exhibits distension. There is no tenderness.  Musculoskeletal: She exhibits edema (2+ bilateral lower extremities, symmetric).  Neurological: She is alert and oriented to person, place, and time.  Skin: Skin is warm. She is diaphoretic.  Vitals reviewed.   ED Course  Procedures (including critical care time)  CRITICAL CARE Performed by: Earlie Server   Total critical care time: 35 minutes  Critical care time was exclusive of separately billable procedures and treating other patients.  Critical care was necessary to treat or prevent imminent or life-threatening deterioration.  Critical care was time spent personally by me on the following activities: development of treatment plan with patient and/or surrogate as well as nursing, discussions with consultants, evaluation of patient's response to treatment, examination of patient, obtaining history from patient or surrogate, ordering and performing treatments and interventions, ordering and review of laboratory studies, ordering and review of radiographic studies, pulse oximetry and re-evaluation of patient's condition.  Labs Review Labs Reviewed  CBC WITH DIFFERENTIAL/PLATELET - Abnormal; Notable for the following:    RBC 2.94 (*)    Hemoglobin 9.6 (*)    HCT 28.5 (*)    Platelets 107 (*)    All other components within normal limits  COMPREHENSIVE METABOLIC PANEL - Abnormal; Notable for the following:    Sodium 125 (*)    Potassium 3.0 (*)    Chloride 74 (*)    CO2 34 (*)    Glucose, Bld 142 (*)    Creatinine, Ser 1.35 (*)    Total Protein 5.4 (*)    Albumin 3.2 (*)    AST 110 (*)    GFR calc non Af Amer 39 (*)    GFR calc Af Amer 45 (*)    Anion gap 17 (*)    All other components within normal limits  AMMONIA - Abnormal; Notable for the following:    Ammonia 46 (*)    All other components within normal limits  I-STAT ARTERIAL BLOOD GAS, ED - Abnormal; Notable for  the following:    pCO2 arterial 59.6 (*)    pO2, Arterial 19.0 (*)    Bicarbonate 35.2 (*)    Acid-Base Excess 8.0 (*)    All other components within normal limits  I-STAT VENOUS BLOOD GAS, ED - Abnormal; Notable for the following:    pH, Ven 7.382 (*)    pCO2, Ven 60.5 (*)    pO2, Ven 22.0 (*)  Bicarbonate 36.0 (*)    Acid-Base Excess 9.0 (*)    All other components within normal limits  BRAIN NATRIURETIC PEPTIDE  BLOOD GAS, ARTERIAL  I-STAT TROPOININ, ED    Imaging Review Dg Chest Port 1 View  01/19/2016  CLINICAL DATA:  Shortness of breath and weakness for 2 days, history hypertension, former smoker, COPD, fibromyalgia, cirrhosis EXAM: PORTABLE CHEST 1 VIEW COMPARISON:  Portable exam 0937 hours compared to 09/21/2015 FINDINGS: Normal heart size, mediastinal contours, and pulmonary vascularity. Lungs clear. No pleural effusion or pneumothorax. Prior cervical spine fusion and RIGHT shoulder arthroplasty. IMPRESSION: No acute abnormalities. Electronically Signed   By: Lavonia Dana M.D.   On: 01/19/2016 08:48   I have personally reviewed and evaluated these images and lab results as part of my medical decision-making.   EKG Interpretation   Date/Time:  Thursday January 19 2016 07:55:49 EDT Ventricular Rate:  77 PR Interval:  167 QRS Duration: 142 QT Interval:  542 QTC Calculation: 614 R Axis:   83 Text Interpretation:  Sinus rhythm Right bundle branch block No  significant change since last tracing Confirmed by NGUYEN, EMILY ([id number])  on 01/19/2016 8:00:41 AM      MDM  Patient seen and evaluated at bedside with husband present. Initially in respiratory distress on exam. Patient with hypokalemia and hyponatremia on results. She did have some soft blood pressures that improved with 2 fluid boluses. She was given a continuous albuterol treatment but did not tolerate it well with a heart rate elevating to the 140s 150s. For this reason albuterol was discontinued and she was given a  Xopenex treatment. She was also given a dose of Solu-Medrol. Her potassium was supplemented. Patient is chronically ill and complicated at baseline. The hospitalist was consult at for admission. Patient and husband were updated on results and plan of care. Patient was admitted in stable condition for further treatment. Final diagnoses:  SOB (shortness of breath)    1. COPD exacerbation 2. Hypokalemia 3. Hyponatremia    Harvel Quale, MD 01/19/16 [phone]

## 2016-01-19 NOTE — ED Notes (Signed)
Trying to call report to [facility]. Unable to reach anyone. Will continue to try

## 2016-01-19 NOTE — ED Notes (Signed)
Tried calling report. Nurse busy and will call me back.

## 2016-01-19 NOTE — Telephone Encounter (Signed)
Patient with history of hepatic cirrhosis with multiple admission for encephalopathy, COPD with chronic respiratory failure, DM with CKD stage 3, presented to office yesterday with up to 5 lb weight gain, SOB. Reviewed labs this morning that had critically low potassium of 2.7 and low sodium of 120. Called the patient and spoke with her husband and instructed him to take her to the ER via private vehicle for potassium and sodium replacement. He expressed understanding and is taking her at this time.

## 2016-01-20 DIAGNOSIS — J9621 Acute and chronic respiratory failure with hypoxia: Secondary | ICD-10-CM | POA: Diagnosis not present

## 2016-01-20 DIAGNOSIS — J440 Chronic obstructive pulmonary disease with acute lower respiratory infection: Secondary | ICD-10-CM | POA: Diagnosis not present

## 2016-01-20 DIAGNOSIS — J9622 Acute and chronic respiratory failure with hypercapnia: Secondary | ICD-10-CM | POA: Diagnosis not present

## 2016-01-20 DIAGNOSIS — J690 Pneumonitis due to inhalation of food and vomit: Secondary | ICD-10-CM | POA: Diagnosis not present

## 2016-01-20 DIAGNOSIS — I471 Supraventricular tachycardia: Secondary | ICD-10-CM | POA: Diagnosis not present

## 2016-01-20 DIAGNOSIS — N183 Chronic kidney disease, stage 3 (moderate): Secondary | ICD-10-CM | POA: Diagnosis not present

## 2016-01-20 DIAGNOSIS — N179 Acute kidney failure, unspecified: Secondary | ICD-10-CM | POA: Diagnosis not present

## 2016-01-20 LAB — COMPREHENSIVE METABOLIC PANEL
ALT: 31 U/L (ref 14–54)
AST: 92 U/L — AB (ref 15–41)
Albumin: 3.3 g/dL — ABNORMAL LOW (ref 3.5–5.0)
Alkaline Phosphatase: 72 U/L (ref 38–126)
Anion gap: 15 (ref 5–15)
BUN: 35 mg/dL — AB (ref 6–20)
CHLORIDE: 83 mmol/L — AB (ref 101–111)
CO2: 27 mmol/L (ref 22–32)
CREATININE: 2.25 mg/dL — AB (ref 0.44–1.00)
Calcium: 8.8 mg/dL — ABNORMAL LOW (ref 8.9–10.3)
GFR calc non Af Amer: 21 mL/min — ABNORMAL LOW (ref >60)
GFR, EST AFRICAN AMERICAN: 24 mL/min — AB (ref >60)
Glucose, Bld: 198 mg/dL — ABNORMAL HIGH (ref 65–99)
POTASSIUM: 4.4 mmol/L (ref 3.5–5.1)
SODIUM: 125 mmol/L — AB (ref 135–145)
Total Bilirubin: 0.8 mg/dL (ref 0.3–1.2)
Total Protein: 5.1 g/dL — ABNORMAL LOW (ref 6.5–8.1)

## 2016-01-20 LAB — CBC
HEMATOCRIT: 28.4 % — AB (ref 36.0–46.0)
Hemoglobin: 9.4 g/dL — ABNORMAL LOW (ref 12.0–15.0)
MCH: 32.2 pg (ref 26.0–34.0)
MCHC: 33.1 g/dL (ref 30.0–36.0)
MCV: 97.3 fL (ref 78.0–100.0)
PLATELETS: 104 10*3/uL — AB (ref 150–400)
RBC: 2.92 MIL/uL — AB (ref 3.87–5.11)
RDW: 14.8 % (ref 11.5–15.5)
WBC: 20 10*3/uL — ABNORMAL HIGH (ref 4.0–10.5)

## 2016-01-20 LAB — MICROALBUMIN / CREATININE URINE RATIO
Creatinine, Urine: 44 mg/dL (ref 20–320)
MICROALB UR: 0.2 mg/dL
MICROALB/CREAT RATIO: 5 ug/mg{creat} (ref <30)

## 2016-01-20 LAB — GLUCOSE, CAPILLARY
GLUCOSE-CAPILLARY: 209 mg/dL — AB (ref 65–99)
Glucose-Capillary: 215 mg/dL — ABNORMAL HIGH (ref 65–99)
Glucose-Capillary: 286 mg/dL — ABNORMAL HIGH (ref 65–99)
Glucose-Capillary: 263 mg/dL — ABNORMAL HIGH (ref 65–99)

## 2016-01-20 LAB — CREATININE, URINE, RANDOM: Creatinine, Urine: 128.68 mg/dL

## 2016-01-20 LAB — OSMOLALITY, URINE: OSMOLALITY UR: 280 mosm/kg — AB (ref 300–900)

## 2016-01-20 LAB — SODIUM, URINE, RANDOM: Sodium, Ur: 17 mmol/L

## 2016-01-20 MED ORDER — LACTULOSE 10 GM/15ML PO SOLN
30.0000 g | Freq: Three times a day (TID) | ORAL | Status: DC
  Administered 2016-01-20 (×2): 30 g via ORAL
  Filled 2016-01-20 (×3): qty 45

## 2016-01-20 MED ORDER — INSULIN ASPART 100 UNIT/ML ~~LOC~~ SOLN
0.0000 [IU] | Freq: Three times a day (TID) | SUBCUTANEOUS | Status: DC
  Administered 2016-01-20: 8 [IU] via SUBCUTANEOUS
  Administered 2016-01-21 (×3): 3 [IU] via SUBCUTANEOUS
  Administered 2016-01-22: 5 [IU] via SUBCUTANEOUS
  Administered 2016-01-22: 3 [IU] via SUBCUTANEOUS
  Administered 2016-01-22 – 2016-01-23 (×2): 2 [IU] via SUBCUTANEOUS
  Administered 2016-01-23: 3 [IU] via SUBCUTANEOUS
  Administered 2016-01-23: 2 [IU] via SUBCUTANEOUS

## 2016-01-20 MED ORDER — INSULIN ASPART 100 UNIT/ML ~~LOC~~ SOLN
8.0000 [IU] | Freq: Once | SUBCUTANEOUS | Status: AC
  Administered 2016-01-20: 8 [IU] via SUBCUTANEOUS

## 2016-01-20 MED ORDER — LEVALBUTEROL HCL 0.63 MG/3ML IN NEBU
0.6300 mg | INHALATION_SOLUTION | RESPIRATORY_TRACT | Status: DC
  Administered 2016-01-20 – 2016-01-22 (×15): 0.63 mg via RESPIRATORY_TRACT
  Filled 2016-01-20 (×15): qty 3

## 2016-01-20 MED ORDER — METHYLPREDNISOLONE SODIUM SUCC 125 MG IJ SOLR
60.0000 mg | Freq: Three times a day (TID) | INTRAMUSCULAR | Status: DC
  Administered 2016-01-20 – 2016-01-22 (×6): 60 mg via INTRAVENOUS
  Filled 2016-01-20 (×5): qty 2

## 2016-01-20 MED ORDER — INSULIN GLARGINE 100 UNIT/ML ~~LOC~~ SOLN
15.0000 [IU] | Freq: Every day | SUBCUTANEOUS | Status: DC
  Administered 2016-01-20 – 2016-01-23 (×4): 15 [IU] via SUBCUTANEOUS
  Filled 2016-01-20 (×5): qty 0.15

## 2016-01-20 MED ORDER — INSULIN ASPART 100 UNIT/ML ~~LOC~~ SOLN
0.0000 [IU] | Freq: Every day | SUBCUTANEOUS | Status: DC
  Administered 2016-01-20 – 2016-01-21 (×2): 2 [IU] via SUBCUTANEOUS
  Administered 2016-01-22: 3 [IU] via SUBCUTANEOUS

## 2016-01-20 MED ORDER — OXYCODONE-ACETAMINOPHEN 5-325 MG PO TABS
2.0000 | ORAL_TABLET | Freq: Four times a day (QID) | ORAL | Status: DC | PRN
  Administered 2016-01-20 – 2016-01-22 (×4): 2 via ORAL
  Filled 2016-01-20 (×4): qty 2

## 2016-01-20 MED ORDER — INSULIN ASPART 100 UNIT/ML ~~LOC~~ SOLN: 0.0000 [IU] | Freq: Three times a day (TID) | SUBCUTANEOUS | Status: DC

## 2016-01-20 MED ORDER — LEVALBUTEROL HCL 0.63 MG/3ML IN NEBU
0.6300 mg | INHALATION_SOLUTION | Freq: Two times a day (BID) | RESPIRATORY_TRACT | Status: DC
  Administered 2016-01-20: 0.63 mg via RESPIRATORY_TRACT
  Filled 2016-01-20: qty 3

## 2016-01-20 MED ORDER — IPRATROPIUM BROMIDE 0.02 % IN SOLN
0.5000 mg | RESPIRATORY_TRACT | Status: DC
  Administered 2016-01-20 – 2016-01-22 (×15): 0.5 mg via RESPIRATORY_TRACT
  Filled 2016-01-20 (×15): qty 2.5

## 2016-01-20 NOTE — Progress Notes (Signed)
PROGRESS NOTE  Subjective:   70 y.o. female with a history of HTN, HL, chronic diastolic heart failure, COPD, chronic respiratory failure with hypoxia and hypercapnia on 3L oxygen 24/7, DM, cirrhosis of liver from NASH, fibromyalgia, esophageal varices, thrombocytopenia and CKD stage 3 who seen by PCP yesterday for regular follow up which was significant for Hyponatremia and hypokalemia and told to go to ED for further evaluation this morning 6/15  She was found to have PAF vs. Atrial tach after getting an albuterol treatment   - with HR of 123    Objective:    Vital Signs:   Temp:  [97.6 F (36.4 C)-97.9 F (36.6 C)] 97.7 F (36.5 C) (06/16 0758) Pulse Rate:  [73-103] 73 (06/16 0758) Resp:  [16] 16 (06/16 0758) BP: (95-116)/(31-53) 95/31 mmHg (06/16 0758) SpO2:  [94 %-99 %] 97 % (06/16 1122) FiO2 (%):  [36 %] 36 % (06/16 1122)  Last BM Date: 01/19/16   24-hour weight change: Weight change:   Weight trends: There were no vitals filed for this visit.  Intake/Output:  06/15 0701 - 06/16 0700 In: 123 [P.O.:120; I.V.:3] Out: -      Physical Exam: BP 95/31 mmHg  Pulse 73  Temp(Src) 97.7 F (36.5 C) (Oral)  Resp 16  SpO2 97%  Wt Readings from Last 3 Encounters:  01/18/16 180 lb (81.647 kg)  12/15/15 177 lb 2 oz (80.343 kg)  11/14/15 175 lb (79.379 kg)    General: Vital signs reviewed and noted.  Chronically ill appearing , actively wheezing   Head: Normocephalic, atraumatic.  Eyes: conjunctivae/corneas clear.  EOM's intact.   Throat: normal  Neck:  thick   Lungs:    bilateral wheezing   Heart:  RR   Abdomen:  Soft, non-tender, non-distended  , obest   Extremities: Trace edema , no c/c    Neurologic: A&O X3, CN II - XII are grossly intact.   Psych: Normal     Labs: BMET:  Recent Labs  01/18/16 1134 01/19/16 0820 01/20/16 0838  NA 120* 125* 125*  K 2.7* 3.0* 4.4  CL <80* 74* 83*  CO2 27 34* 27  GLUCOSE 104* 142* 198*  BUN 22 20 35*    CREATININE 1.24* 1.35* 2.25*  CALCIUM 9.2 9.6 8.8*  MG 1.9 2.1  --     Liver function tests:  Recent Labs  01/19/16 0820 01/20/16 0838  AST 110* 92*  ALT 26 31  ALKPHOS 77 72  BILITOT 1.1 0.8  PROT 5.4* 5.1*  ALBUMIN 3.2* 3.3*   No results for input(s): LIPASE, AMYLASE in the last 72 hours.  CBC:  Recent Labs  01/18/16 1134 01/19/16 0820 01/20/16 0838  WBC 5.4 5.5 20.0*  NEUTROABS 4104 3.9  --   HGB 9.8* 9.6* 9.4*  HCT 28.5* 28.5* 28.4*  MCV 96.0 96.9 97.3  PLT 97* 107* 104*    Cardiac Enzymes: No results for input(s): CKTOTAL, CKMB, TROPONINI in the last 72 hours.  Coagulation Studies: No results for input(s): LABPROT, INR in the last 72 hours.  Other: Invalid input(s): POCBNP No results for input(s): DDIMER in the last 72 hours.  Recent Labs  01/18/16 1134  HGBA1C 6.2*    Recent Labs  01/18/16 1134  CHOL 163  HDL 51  LDLCALC 76  TRIG 181*  CHOLHDL 3.2    Recent Labs  01/18/16 1134  TSH 1.44    Recent Labs  01/18/16 1134  FERRITIN 101  TIBC 379  IRON 77     Other results:     ( personally reviewed )  - tele :   NSR   Medications:    Infusions:    Scheduled Medications: . allopurinol  100 mg Oral Daily  . aspirin  81 mg Oral Daily  . bisoprolol  10 mg Oral Daily  . cefTRIAXone (ROCEPHIN)  IV  1 g Intravenous Q24H  . ferrous sulfate  325 mg Oral Daily  . insulin aspart  0-15 Units Subcutaneous TID WC  . insulin aspart  0-5 Units Subcutaneous QHS  . ipratropium  0.5 mg Nebulization Q4H  . lactulose  30 g Oral TID  . levalbuterol  0.63 mg Nebulization Q4H  . magnesium gluconate  250 mg Oral Q0600  . methylPREDNISolone (SOLU-MEDROL) injection  60 mg Intravenous Q8H  . pantoprazole  40 mg Oral Daily  . potassium chloride  20 mEq Oral BID  . sodium chloride flush  3 mL Intravenous Q12H  . tamoxifen  20 mg Oral Daily    Assessment/ Plan:   Active Problems:   Anemia   Hepatic cirrhosis (HCC)   GERD    Thrombocytopenia (HCC)   CKD stage 3 due to type 2 diabetes mellitus (HCC)   Hypokalemia   DM type 2 causing CKD stage 3 (HCC)   COPD exacerbation ([facility]CC)   Acute on chronic respiratory failure ([place])   1. Arrhythmia :   Atrial fib vs. MAT Difficult to tell.   Clearly in NSR currently. Seems to have occurred with an albuteral treatment. Would suggest changing to xopenex to minimize tachyarrhythmias As noted by Dr. Percival Spanish,  She is a not a candidate for chronic anticoagulation   2. Chronic diastolic CHF:   Appears stable  3. COPD :   Is having a COPD exacerbation ;   Needs much more aggressive treatment  Disposition:  Length of Stay: 1  Thayer Headings, Brooke Bonito., MD, [facility] 01/20/2016, 1:34 PM Office [phone] Pager [phone]

## 2016-01-20 NOTE — Care Management Note (Signed)
Case Management Note  Patient Details  Name: TASNIM BALENTINE MRN: [id number] Date of Birth: [birth year]  Subjective/Objective:       CM following for progression and d/c planning.             Action/Plan: 01/20/2016 Noted PT eval recommending HHPT, will follow for orders etc. Pt appears to have DME . Noted that pt insurance will require a copay for all [facility] services per visit. Will informed pt of this prior to arranging services.   Expected Discharge Date:                  Expected Discharge Plan:  [place]  In-House Referral:  NA  Discharge planning Services  CM Consult  Post Acute Care Choice:    Choice offered to:     DME Arranged:    DME Agency:     HH Arranged:    HH Agency:     Status of Service:  In process, will continue to follow  Medicare Important Message Given:  Yes Date Medicare IM Given:    Medicare IM give by:    Date Additional Medicare IM Given:    Additional Medicare Important Message give by:     If discussed at [place] of Stay Meetings, dates discussed:    Additional Comments:  Adron Bene, RN 01/20/2016, 1:51 PM

## 2016-01-20 NOTE — Progress Notes (Signed)
Inpatient Diabetes Program Recommendations  AACE/ADA: New Consensus Statement on Inpatient Glycemic Control (2015)  Target Ranges:  Prepandial:   less than 140 mg/dL      Peak postprandial:   less than 180 mg/dL (1-2 hours)      Critically ill patients:  140 - 180 mg/dL   Lab Results  Component Value Date   GLUCAP 286* 01/20/2016   HGBA1C 6.2* 01/18/2016    Review of Glycemic Control  Results for [name], [name] (MRN [id number]) as of 01/20/2016 13:14  Ref. Range 01/19/2016 18:07 01/19/2016 20:33 01/19/2016 20:34 01/20/2016 07:43 01/20/2016 12:04  Glucose-Capillary Latest Ref Range: 65-99 mg/dL 403 (H) 441 (H) 422 (H) 215 (H) 286 (H)    Diabetes history: A1C 6.7% on 11/14/15 Outpatient Diabetes medications: Metformin 1064m bid Current orders for Inpatient glycemic control: Novolog 0-15 units tid with meals, Novolog 0-5 units qhs  * steroids  Inpatient Diabetes Program Recommendations:  Consider starting low dose basal insulin while patient is on steroids.  Consider 16 units Lantus insulin (0.2units/kg) qhs beginning tonight.  Patient will likely require resistant Novolog correction (0-20 units tid) while she's on steroids.   JGentry Fitz RN, BA, MHA, CDE Diabetes Coordinator Inpatient Diabetes Program  [phone] Pager) [phone]([place] 01/20/2016 1:31 PM

## 2016-01-20 NOTE — Care Management Important Message (Signed)
Important Message  Patient Details  Name: [name] MRN: [id number] Date of Birth: 1945/12/14   Medicare Important Message Given:  Yes    Loann Quill 01/20/2016, 9:32 AM

## 2016-01-20 NOTE — Evaluation (Signed)
Physical Therapy Evaluation Patient Details Name: [name] MRN: [id number] DOB: 06-30-1946 Today's Date: 01/20/2016   History of Present Illness  70 yo female with onset of COPD flare and chronic O2 use 3L at home.  Low BP and current L ankle gouty flare.  PMHx:  encephalopathy, NASH cirrhosis, thrombocytopenia, B hip surgeries,   Clinical Impression  Pt is up to walk with PT and has drop in O2 from 97% to 93% with effort.  Her plan is to follow up with therapy and work here to strengthen and increase her standing/gait as vitals and tolerance for activity allow.   Will need to consider if outpatient follow up at cardiac rehab might benefit to work on regulated controlled strengthening with monitoring.  Continue physical therapy acutely.    Follow Up Recommendations Home health PT    Equipment Recommendations  None recommended by PT    Recommendations for Other Services Rehab consult     Precautions / Restrictions Precautions Precautions: Fall (telemetry) Restrictions Weight Bearing Restrictions: No      Mobility  Bed Mobility Overal bed mobility: Needs Assistance Bed Mobility: Supine to Sit;Sit to Supine     Supine to sit: Min guard Sit to supine: Min guard      Transfers Overall transfer level: Needs assistance Equipment used: Rolling walker (2 wheeled) Transfers: Sit to/from [facility] Sit to Stand: Min guard Stand pivot transfers: Min guard;Min assist       General transfer comment: reminders for hand placment and safety  Ambulation/Gait Ambulation/Gait assistance: Min guard;Min assist Ambulation Distance (Feet): 6 Feet Assistive device: Rolling walker (2 wheeled);1 person hand held assist Gait Pattern/deviations: Step-to pattern;Step-through pattern;Decreased stride length;Wide base of support;Drifts right/left Gait velocity: reduced Gait velocity interpretation: Below normal speed for age/gender General Gait Details: has brief walk  due to awkward use of LLE with gout in ankle  Stairs            Wheelchair Mobility    Modified Rankin (Stroke Patients Only)       Balance Overall balance assessment: Needs assistance Sitting-balance support: Feet supported Sitting balance-Leahy Scale: Good   Postural control: Posterior lean Standing balance support: Bilateral upper extremity supported Standing balance-Leahy Scale: Fair                               Pertinent Vitals/Pain Pain Assessment: Faces Faces Pain Scale: Hurts even more Pain Location: L ankle with standing and gait Pain Descriptors / Indicators: Aching;Tender Pain Intervention(s): Limited activity within patient's tolerance;Monitored during session;Repositioned    Home Living Family/patient expects to be discharged to:: Private residence Living Arrangements: Spouse/significant other Available Help at Discharge: Family;Available 24 hours/day Type of Home: House Home Access: Stairs to enter Entrance Stairs-Rails: Right Entrance Stairs-Number of Steps: 2 Home Layout: One level Home Equipment: Walker - 2 wheels;Bedside commode;Shower seat;Electric scooter;Other (comment)      Prior Function Level of Independence: Independent with assistive device(s)         Comments: uses RW household distances and power chair for community distances     Hand Dominance        Extremity/Trunk Assessment   Upper Extremity Assessment: Overall WFL for tasks assessed           Lower Extremity Assessment: Generalized weakness      Cervical / Trunk Assessment: Normal  Communication   Communication: No difficulties  Cognition Arousal/Alertness: Awake/alert Behavior During Therapy: WFL for tasks  assessed/performed Overall Cognitive Status: Within Functional Limits for tasks assessed                      General Comments General comments (skin integrity, edema, etc.): Pt is up to take a few steps with 3L O2 via nasal  cannula but cannot walk far due to pain.  Has plan to get home with husband and is comfortable with HHPT coming out to see her.    Exercises        Assessment/Plan    PT Assessment Patient needs continued PT services  PT Diagnosis Generalized weakness;Difficulty walking   PT Problem List Decreased strength;Decreased range of motion;Decreased activity tolerance;Decreased balance;Decreased mobility;Decreased coordination;Decreased safety awareness;Cardiopulmonary status limiting activity;Obesity;Pain;Decreased skin integrity  PT Treatment Interventions DME instruction;Gait training;Stair training;Functional mobility training;Therapeutic activities;Therapeutic exercise;Balance training;Neuromuscular re-education;Patient/family education   PT Goals (Current goals can be found in the Care Plan section) Acute Rehab PT Goals Patient Stated Goal: to get home with husband (really helps me) PT Goal Formulation: With patient Time For Goal Achievement: 02/03/16 Potential to Achieve Goals: Good    Frequency Min 2X/week   Barriers to discharge Inaccessible home environment stairs on her door    Co-evaluation               End of Session Equipment Utilized During Treatment: Oxygen Activity Tolerance: Patient tolerated treatment well;Patient limited by pain Patient left: in bed;with call bell/phone within reach;with bed alarm set Nurse Communication: Mobility status         Time: 0174-9449 PT Time Calculation (min) (ACUTE ONLY): 28 min   Charges:   PT Evaluation $PT Eval Moderate Complexity: 1 Procedure PT Treatments $Gait Training: 8-22 mins   PT G CodesRamond Dial 02/11/2016, 10:41 AM    Mee Hives, PT MS Acute Rehab Dept. Number: [place] [facility] [place]

## 2016-01-20 NOTE — Progress Notes (Addendum)
PROGRESS NOTE                                                                                                                                                                                                             Patient Demographics:    [name], is a 70 y.o. female, DOB - 1946-04-09, KXF:[id number]  Admit date - 01/19/2016   Admitting Physician Albertine Patricia, MD  Outpatient Primary MD for the patient is Alesia Richards, MD  LOS - 1  Outpatient Specialists: none  Chief Complaint  Patient presents with  . Shortness of Breath       Brief Narrative  70 year old female with [name] cirrhosis and varices, CK D, COPD on chronic home O2 (3 L), fibromyalgia, see daily stage III seen at PCP office with progressive weakness and shortness of breath. Blood work done showed significant hyponatremia and hypokalemia and was sent to the ED. Patient was started on azithromycin for acute bronchitis symptoms by her PCP without much help. In the ED she was significantly short of breath with wheezing and developed tachycardia after receiving treatment with albuterol and went into transient A. fib. Patient also became hypotensive in the ED with systolic blood pressure in the 80s. Blood work showed CO2 retention. Sodium of 125 and potassium of 3 along with cytopenia at baseline. Admitted to telemetry for acute on chronic respiratory failure due to COPD exacerbation and cardiology consulted.    Subjective:   Patient reports ongoing shortness of breath. Heart rate improved on telemetry.   Assessment  & Plan :    Principal problem  acute on chronic hypoxic and hypercapnic respiratory failure Secondary to COPD exacerbation with acute bronchitis. Placed on scheduled IV Solu-Medrol. Albuterol switched to Xopenex twice a day given ongoing tachycardia. Will increase open next every 4 hours and also add scheduled Atrovent every 4  hours given worsened symptoms. Add antitussives. Empiric Rocephin. Continue O2 via nasal cannula. Pulmonary toilet.  Acute on chronic kidney disease stage III (baseline creatinine 1.3-1.4) Possibly prerenal with hypotension. Hold Lasix, Zaroxolyn and metformin. Continue with IV hydration.  Hyponatremia Possibly prerenal with hypovolemia and diuretic use. Check urine sodium and osmolality. Monitor with IV fluids.  A. fib versus MAT Likely transient due to albuterol use. Switch to Xopenex. Heart rate better controlled now. Seen by cardiology and recommend that she  is not a good candidate for anticoagulation. Continue aspirin and bisoprolol.  Hypokalemia Repleted. Normal magnesium.  NASH cirrhosis Continue to hold Lasix and Aldactone. Ammonia elevated on admission. Mental status at baseline. Has not had bowel movement in 2 days. Will increase her dose of lactulose to titrate to at least 3 loose bowel movements daily.   Chronic diastolic CHF Appears hypovolemic. Monitor with IV fluids.  Type 2 diabetes mellitus, uncontrolled Holding metformin. CBG elevated due to steroid use. Will add Lantus. Continue moderate sliding scale coverage.  Chronic Thrombcytopenia Secondary to cirrhosis. Stable.  Acute gouty arthritis Cannot use NSAIDs or colchicine given her renal insufficiency. Empiric Solu-Medrol which showed improve her symptoms. Add when necessary Percocet  Prolonged QTC Discontinue azithromycin and citalopram held. Magnesium normal. Check repeat EKG.  Leukocytosis Possibly due to steroid use.   Physical deconditioning  PT evaluation   Code Status : Full code  Family Communication  : Husband at bedside  Disposition Plan  : Home once clinically improved, possibly in the next 48 haven't 72 hours  Barriers For Discharge : Active symptoms  Consults  :  Cardiology  Procedures  : None  DVT Prophylaxis  :SCDs   Lab Results  Component Value Date   PLT 104* 01/20/2016     Antibiotics  :    Anti-infectives    Start     Dose/Rate Route Frequency Ordered Stop   01/19/16 1430  cefTRIAXone (ROCEPHIN) 1 g in dextrose 5 % 50 mL IVPB     1 g 100 mL/hr over 30 Minutes Intravenous Every 24 hours 01/19/16 1421          Objective:   Filed Vitals:   01/20/16 0758 01/20/16 0818 01/20/16 1122 01/20/16 1406  BP: 95/31   120/56  Pulse: 73   73  Temp: 97.7 F (36.5 C)     TempSrc: Oral     Resp: 16   20  SpO2: 94% 95% 97% 96%    Wt Readings from Last 3 Encounters:  01/18/16 81.647 kg (180 lb)  12/15/15 80.343 kg (177 lb 2 oz)  11/14/15 79.379 kg (175 lb)     Intake/Output Summary (Last 24 hours) at 01/20/16 1428 Last data filed at 01/20/16 0006  Gross per 24 hour  Intake    123 ml  Output      0 ml  Net    123 ml     Physical Exam  Gen: not in distress, Appears fatigued HEENT: no pallor, moist mucosa, supple neck, no JVD Chest: Diminished bilateral breath sounds with diffuse scattered wheezing CVS: N S1&S2, no murmurs, rubs or gallop GI: soft, NT, ND, BS+ Musculoskeletal: warm, no edema, short right arm CNS: Alert and oriented, no tremors    Data Review:    CBC  Recent Labs Lab 01/18/16 1134 01/19/16 0820 01/20/16 0838  WBC 5.4 5.5 20.0*  HGB 9.8* 9.6* 9.4*  HCT 28.5* 28.5* 28.4*  PLT 97* 107* 104*  MCV 96.0 96.9 97.3  MCH 33.0 32.7 32.2  MCHC 34.4 33.7 33.1  RDW 15.5* 14.4 14.8  LYMPHSABS 702* 1.1  --   MONOABS 378 0.3  --   EOSABS 216 0.3  --   BASOSABS 0 0.0  --     Chemistries   Recent Labs Lab 01/18/16 1134 01/19/16 0820 01/20/16 0838  NA 120* 125* 125*  K 2.7* 3.0* 4.4  CL <80* 74* 83*  CO2 27 34* 27  GLUCOSE 104* 142* 198*  BUN 22 20 35*  CREATININE 1.24* 1.35* 2.25*  CALCIUM 9.2 9.6 8.8*  MG 1.9 2.1  --   AST 79* 110* 92*  ALT 23 26 31   ALKPHOS 80 77 72  BILITOT 1.0 1.1 0.8    ------------------------------------------------------------------------------------------------------------------  Recent Labs  01/18/16 1134  CHOL 163  HDL 51  LDLCALC 76  TRIG 181*  CHOLHDL 3.2    Lab Results  Component Value Date   HGBA1C 6.2* 01/18/2016   ------------------------------------------------------------------------------------------------------------------  Recent Labs  01/18/16 1134  TSH 1.44   ------------------------------------------------------------------------------------------------------------------  Recent Labs  01/18/16 1134  FERRITIN 101  TIBC 379  IRON 77    Coagulation profile No results for input(s): INR, PROTIME in the last 168 hours.  No results for input(s): DDIMER in the last 72 hours.  Cardiac Enzymes No results for input(s): CKMB, TROPONINI, MYOGLOBIN in the last 168 hours.  Invalid input(s): CK ------------------------------------------------------------------------------------------------------------------    Component Value Date/Time   BNP 72.2 01/19/2016 0820   BNP 43.2 12/17/2014 1204    Inpatient Medications  Scheduled Meds: . allopurinol  100 mg Oral Daily  . aspirin  81 mg Oral Daily  . bisoprolol  10 mg Oral Daily  . cefTRIAXone (ROCEPHIN)  IV  1 g Intravenous Q24H  . ferrous sulfate  325 mg Oral Daily  . insulin aspart  0-15 Units Subcutaneous TID WC  . insulin aspart  0-5 Units Subcutaneous QHS  . ipratropium  0.5 mg Nebulization Q4H  . lactulose  30 g Oral TID  . levalbuterol  0.63 mg Nebulization Q4H  . magnesium gluconate  250 mg Oral Q0600  . methylPREDNISolone (SOLU-MEDROL) injection  60 mg Intravenous Q8H  . pantoprazole  40 mg Oral Daily  . potassium chloride  20 mEq Oral BID  . sodium chloride flush  3 mL Intravenous Q12H  . tamoxifen  20 mg Oral Daily   Continuous Infusions:  PRN Meds:.  Micro Results No results found for this or any previous visit (from the past 240  hour(s)).  Radiology Reports Dg Chest Port 1 View  01/19/2016  CLINICAL DATA:  Shortness of breath and weakness for 2 days, history hypertension, former smoker, COPD, fibromyalgia, cirrhosis EXAM: PORTABLE CHEST 1 VIEW COMPARISON:  Portable exam 0937 hours compared to 09/21/2015 FINDINGS: Normal heart size, mediastinal contours, and pulmonary vascularity. Lungs clear. No pleural effusion or pneumothorax. Prior cervical spine fusion and RIGHT shoulder arthroplasty. IMPRESSION: No acute abnormalities. Electronically Signed   By: Lavonia Dana M.D.   On: 01/19/2016 08:48    Time Spent in minutes  25   Louellen Molder M.D on 01/20/2016 at 2:28 PM  Between 7am to 7pm - Pager - [phone]  After 7pm go to [email or web address] - password [email or web address]  [facility] -  Office  [phone]

## 2016-01-21 ENCOUNTER — Inpatient Hospital Stay (HOSPITAL_COMMUNITY): Payer: PPO

## 2016-01-21 DIAGNOSIS — E871 Hypo-osmolality and hyponatremia: Secondary | ICD-10-CM | POA: Diagnosis not present

## 2016-01-21 DIAGNOSIS — D696 Thrombocytopenia, unspecified: Secondary | ICD-10-CM

## 2016-01-21 DIAGNOSIS — J690 Pneumonitis due to inhalation of food and vomit: Secondary | ICD-10-CM

## 2016-01-21 DIAGNOSIS — N179 Acute kidney failure, unspecified: Secondary | ICD-10-CM | POA: Diagnosis not present

## 2016-01-21 DIAGNOSIS — J441 Chronic obstructive pulmonary disease with (acute) exacerbation: Secondary | ICD-10-CM | POA: Diagnosis not present

## 2016-01-21 DIAGNOSIS — J9611 Chronic respiratory failure with hypoxia: Secondary | ICD-10-CM | POA: Diagnosis not present

## 2016-01-21 DIAGNOSIS — I4581 Long QT syndrome: Secondary | ICD-10-CM

## 2016-01-21 DIAGNOSIS — R0602 Shortness of breath: Secondary | ICD-10-CM | POA: Diagnosis not present

## 2016-01-21 DIAGNOSIS — J9622 Acute and chronic respiratory failure with hypercapnia: Secondary | ICD-10-CM

## 2016-01-21 DIAGNOSIS — I480 Paroxysmal atrial fibrillation: Secondary | ICD-10-CM | POA: Diagnosis not present

## 2016-01-21 DIAGNOSIS — R0789 Other chest pain: Secondary | ICD-10-CM | POA: Diagnosis not present

## 2016-01-21 DIAGNOSIS — J9621 Acute and chronic respiratory failure with hypoxia: Secondary | ICD-10-CM | POA: Diagnosis not present

## 2016-01-21 LAB — BASIC METABOLIC PANEL
Anion gap: 14 (ref 5–15)
BUN: 50 mg/dL — AB (ref 6–20)
CHLORIDE: 84 mmol/L — AB (ref 101–111)
CO2: 25 mmol/L (ref 22–32)
CREATININE: 2.15 mg/dL — AB (ref 0.44–1.00)
Calcium: 8.8 mg/dL — ABNORMAL LOW (ref 8.9–10.3)
GFR calc Af Amer: 26 mL/min — ABNORMAL LOW (ref >60)
GFR calc non Af Amer: 22 mL/min — ABNORMAL LOW (ref >60)
GLUCOSE: 175 mg/dL — AB (ref 65–99)
POTASSIUM: 4.9 mmol/L (ref 3.5–5.1)
SODIUM: 123 mmol/L — AB (ref 135–145)

## 2016-01-21 LAB — GLUCOSE, CAPILLARY
GLUCOSE-CAPILLARY: 194 mg/dL — AB (ref 65–99)
GLUCOSE-CAPILLARY: 191 mg/dL — AB (ref 65–99)
GLUCOSE-CAPILLARY: 212 mg/dL — AB (ref 65–99)
Glucose-Capillary: 174 mg/dL — ABNORMAL HIGH (ref 65–99)

## 2016-01-21 LAB — MAGNESIUM: Magnesium: 2.6 mg/dL — ABNORMAL HIGH (ref 1.7–2.4)

## 2016-01-21 LAB — CBC
HEMATOCRIT: 27.2 % — AB (ref 36.0–46.0)
Hemoglobin: 8.8 g/dL — ABNORMAL LOW (ref 12.0–15.0)
MCH: 31.9 pg (ref 26.0–34.0)
MCHC: 32.4 g/dL (ref 30.0–36.0)
MCV: 98.6 fL (ref 78.0–100.0)
Platelets: 99 10*3/uL — ABNORMAL LOW (ref 150–400)
RBC: 2.76 MIL/uL — ABNORMAL LOW (ref 3.87–5.11)
RDW: 15.3 % (ref 11.5–15.5)
WBC: 13 10*3/uL — AB (ref 4.0–10.5)

## 2016-01-21 LAB — BLOOD GAS, ARTERIAL
Acid-Base Excess: 4.3 mmol/L — ABNORMAL HIGH (ref 0.0–2.0)
BICARBONATE: 28.7 meq/L — AB (ref 20.0–24.0)
Drawn by: 105521
O2 Content: 4 L/min
O2 Saturation: 93.5 %
PATIENT TEMPERATURE: 98.6
PCO2 ART: 46.2 mmHg — AB (ref 35.0–45.0)
PH ART: 7.411 (ref 7.350–7.450)
TCO2: 30.2 mmol/L (ref 0–100)
pO2, Arterial: 70.9 mmHg — ABNORMAL LOW (ref 80.0–100.0)

## 2016-01-21 MED ORDER — SODIUM CHLORIDE 0.9 % IV SOLN
3.0000 g | Freq: Two times a day (BID) | INTRAVENOUS | Status: DC
  Administered 2016-01-21 – 2016-01-23 (×5): 3 g via INTRAVENOUS
  Filled 2016-01-21 (×6): qty 3

## 2016-01-21 MED ORDER — FUROSEMIDE 10 MG/ML IJ SOLN
60.0000 mg | Freq: Once | INTRAMUSCULAR | Status: AC
  Administered 2016-01-21: 60 mg via INTRAVENOUS
  Filled 2016-01-21: qty 6

## 2016-01-21 MED ORDER — LACTULOSE 10 GM/15ML PO SOLN
30.0000 g | Freq: Every day | ORAL | Status: DC
  Administered 2016-01-21 – 2016-01-24 (×4): 30 g via ORAL
  Filled 2016-01-21 (×3): qty 45

## 2016-01-21 MED ORDER — SODIUM CHLORIDE 0.9 % IV SOLN
3.0000 g | Freq: Four times a day (QID) | INTRAVENOUS | Status: DC
  Filled 2016-01-21 (×2): qty 3

## 2016-01-21 NOTE — Progress Notes (Signed)
PROGRESS NOTE                                                                                                                                                                                                             Patient Demographics:    [name], is a 70 y.o. female, DOB - [birth year], YFR:[id number]  Admit date - 01/19/2016   Admitting Physician Albertine Patricia, MD  Outpatient Primary MD for the patient is Alesia Richards, MD  LOS - 2  Outpatient Specialists: none  Chief Complaint  Patient presents with  . Shortness of Breath       Brief Narrative  70 year old female with [name] cirrhosis and varices, CK D, COPD on chronic home O2 (3 L), fibromyalgia, see daily stage III seen at PCP office with progressive weakness and shortness of breath. Blood work done showed significant hyponatremia and hypokalemia and was sent to the ED. Patient was started on azithromycin for acute bronchitis symptoms by her PCP without much help. In the ED she was significantly short of breath with wheezing and developed tachycardia after receiving treatment with albuterol and went into transient A. fib. Patient also became hypotensive in the ED with systolic blood pressure in the 80s. Blood work showed CO2 retention. Sodium of 125 and potassium of 3 along with cytopenia at baseline. Admitted to telemetry for acute on chronic respiratory failure due to COPD exacerbation and cardiology consulted.    Subjective:   She appears very tired and wheezy. Heart rate better on telemetry.   Assessment  & Plan :    Principal problem  acute on chronic hypoxic and hypercapnic respiratory failure Secondary to COPD exacerbation with acute bronchitis.Patient still very wheezy. Placed on scheduled IV Solu-Medrol. Continue scheduled Xopenex and Atrovent nebs . Maintaining sats on 3 L. Repeat ABG shows mild hypercapnia. Continue Solu-Medrol and  antitussives. Pulmonary consult if patient has persistent symptoms.  Right lobar pneumonia As seen on chest x-ray. Ocean mild aspiration. Switch antibiotics to Unasyn. Swallow evaluation.  Acute on chronic kidney disease stage III (baseline creatinine 1.3-1.4) Possibly prerenal with hypotension. Held Lasix, Zaroxolyn and metformin admission.. Patient appears mildly overloaded. Will discontinue fluids and give her a dose of IV Lasix.  Hyponatremia Initially thought to be prerenal and given IV fluids however sodium worsened to 123 today. Has low urine sodium and  osmolality. Will give a dose of IV Lasix and monitor. Strict fluid restriction. Monitor strict I/O.  A. fib versus MAT Likely transient due to albuterol use. Heart rate better control.  Hypokalemia Repleted. Recheck magnesium.  Prolonged QTC >600 on EKG. Normal K. Check magnesium. Avoid Qt longing agents (Celexa and azithromycin discontinued)  NASH cirrhosis Holding Aldactone. Ordered Lasix 1.. Having multiple loose bowel movement with increased dose of lactulose. Dose adjusted..   Chronic diastolic CHF Appears hypovolemic. Monitor with IV fluids.  Type 2 diabetes mellitus, uncontrolled Holding metformin. CBG elevated due to steroid use. Added Lantus. Continue moderate sliding scale coverage.  Chronic Thrombcytopenia Secondary to cirrhosis. Stable.  Acute gouty arthritis Cannot use NSAIDs or colchicine given her renal insufficiency. Empiric Solu-Medrol which showed improve her symptoms. Added when necessary Percocet    Leukocytosis Possibly due to steroid use.   Physical deconditioning  PT evaluation   Code Status : Full code  Family Communication  : Husband at bedside  Disposition Plan  : Home once clinically improved,   Barriers For Discharge : Active symptoms  Consults  :  Cardiology  Procedures  : None  DVT Prophylaxis  :SCDs   Lab Results  Component Value Date   PLT 99* 01/21/2016     Antibiotics  :    Anti-infectives    Start     Dose/Rate Route Frequency Ordered Stop   01/19/16 1430  cefTRIAXone (ROCEPHIN) 1 g in dextrose 5 % 50 mL IVPB  Status:  Discontinued     1 g 100 mL/hr over 30 Minutes Intravenous Every 24 hours 01/19/16 1421 01/21/16 1331        Objective:   Filed Vitals:   01/20/16 2027 01/21/16 0452 01/21/16 0947 01/21/16 1250  BP: 129/70 130/53 129/54   Pulse: 79 82 85   Temp: 98 F (36.7 C) 97.7 F (36.5 C) 97.9 F (36.6 C)   TempSrc: Oral Oral Oral   Resp: 18 18 22    Weight: 87.7 kg (193 lb 5.5 oz)     SpO2: 91% 92% 93% 94%    Wt Readings from Last 3 Encounters:  01/20/16 87.7 kg (193 lb 5.5 oz)  01/18/16 81.647 kg (180 lb)  12/15/15 80.343 kg (177 lb 2 oz)     Intake/Output Summary (Last 24 hours) at 01/21/16 1331 Last data filed at 01/21/16 0858  Gross per 24 hour  Intake    480 ml  Output      0 ml  Net    480 ml     Physical Exam  Gen: not in distress, Appears fatigued HEENT: no pallor, moist mucosa, supple neck, no JVD Chest: Diffuse bilateral wheezing with diminished breath sounds CVS: N S1&S2, no murmurs, rubs or gallop GI: soft, NT, ND, BS+ Musculoskeletal: warm, no edema, short right arm CNS: Alert and oriented, no tremors    Data Review:    CBC  Recent Labs Lab 01/18/16 1134 01/19/16 0820 01/20/16 0838 01/21/16 0540  WBC 5.4 5.5 20.0* 13.0*  HGB 9.8* 9.6* 9.4* 8.8*  HCT 28.5* 28.5* 28.4* 27.2*  PLT 97* 107* 104* 99*  MCV 96.0 96.9 97.3 98.6  MCH 33.0 32.7 32.2 31.9  MCHC 34.4 33.7 33.1 32.4  RDW 15.5* 14.4 14.8 15.3  LYMPHSABS 702* 1.1  --   --   MONOABS 378 0.3  --   --   EOSABS 216 0.3  --   --   BASOSABS 0 0.0  --   --  Chemistries   Recent Labs Lab 01/18/16 1134 01/19/16 0820 01/20/16 0838 01/21/16 0540  NA 120* 125* 125* 123*  K 2.7* 3.0* 4.4 4.9  CL <80* 74* 83* 84*  CO2 27 34* 27 25  GLUCOSE 104* 142* 198* 175*  BUN 22 20 35* 50*  CREATININE 1.24* 1.35* 2.25* 2.15*   CALCIUM 9.2 9.6 8.8* 8.8*  MG 1.9 2.1  --   --   AST 79* 110* 92*  --   ALT 23 26 31   --   ALKPHOS 80 77 72  --   BILITOT 1.0 1.1 0.8  --    ------------------------------------------------------------------------------------------------------------------ No results for input(s): CHOL, HDL, LDLCALC, TRIG, CHOLHDL, LDLDIRECT in the last 72 hours.  Lab Results  Component Value Date   HGBA1C 6.2* 01/18/2016   ------------------------------------------------------------------------------------------------------------------ No results for input(s): TSH, T4TOTAL, T3FREE, THYROIDAB in the last 72 hours.  Invalid input(s): FREET3 ------------------------------------------------------------------------------------------------------------------ No results for input(s): VITAMINB12, FOLATE, FERRITIN, TIBC, IRON, RETICCTPCT in the last 72 hours.  Coagulation profile No results for input(s): INR, PROTIME in the last 168 hours.  No results for input(s): DDIMER in the last 72 hours.  Cardiac Enzymes No results for input(s): CKMB, TROPONINI, MYOGLOBIN in the last 168 hours.  Invalid input(s): CK ------------------------------------------------------------------------------------------------------------------    Component Value Date/Time   BNP 72.2 01/19/2016 0820   BNP 43.2 12/17/2014 1204    Inpatient Medications  Scheduled Meds: . allopurinol  100 mg Oral Daily  . aspirin  81 mg Oral Daily  . bisoprolol  10 mg Oral Daily  . ferrous sulfate  325 mg Oral Daily  . insulin aspart  0-15 Units Subcutaneous TID WC  . insulin aspart  0-5 Units Subcutaneous QHS  . insulin glargine  15 Units Subcutaneous QHS  . ipratropium  0.5 mg Nebulization Q4H  . lactulose  30 g Oral Daily  . levalbuterol  0.63 mg Nebulization Q4H  . magnesium gluconate  250 mg Oral Q0600  . methylPREDNISolone (SOLU-MEDROL) injection  60 mg Intravenous Q8H  . pantoprazole  40 mg Oral Daily  . potassium chloride  20  mEq Oral BID  . sodium chloride flush  3 mL Intravenous Q12H  . tamoxifen  20 mg Oral Daily   Continuous Infusions:  PRN Meds:.  Micro Results No results found for this or any previous visit (from the past 240 hour(s)).  Radiology Reports Dg Chest Port 1 View  01/21/2016  CLINICAL DATA:  Shortness of breath EXAM: PORTABLE CHEST 1 VIEW COMPARISON:  01/19/2016 chest radiograph. FINDINGS: Surgical hardware overlies the visualized cervical spine and right proximal humerus. Stable cardiomediastinal silhouette with mild cardiomegaly. No pneumothorax. No pleural effusion. Patchy consolidation at both lung bases is new. No pulmonary edema. IMPRESSION: New patchy bibasilar lung consolidation suspicious for aspiration and/ or pneumonia. Electronically Signed   By: Ilona Sorrel M.D.   On: 01/21/2016 11:56   Dg Chest Port 1 View  01/19/2016  CLINICAL DATA:  Shortness of breath and weakness for 2 days, history hypertension, former smoker, COPD, fibromyalgia, cirrhosis EXAM: PORTABLE CHEST 1 VIEW COMPARISON:  Portable exam 0937 hours compared to 09/21/2015 FINDINGS: Normal heart size, mediastinal contours, and pulmonary vascularity. Lungs clear. No pleural effusion or pneumothorax. Prior cervical spine fusion and RIGHT shoulder arthroplasty. IMPRESSION: No acute abnormalities. Electronically Signed   By: Lavonia Dana M.D.   On: 01/19/2016 08:48    Time Spent in minutes  35   Louellen Molder M.D on 01/21/2016 at 1:31 PM  Between 7am to 7pm -  Pager - [phone]  After 7pm go to [email or web address] - password [facility]  [facility] -  Office  [phone]

## 2016-01-21 NOTE — Progress Notes (Signed)
Subjective:  She says that she was up all night going to the bathroom in response to a laxative.  Had some fecal incontinence.  Still mildly short of breath.  No chest pain.  Objective:  Vital Signs in the last 24 hours: BP 130/53 mmHg  Pulse 82  Temp(Src) 97.7 F (36.5 C) (Oral)  Resp 18  Wt 87.7 kg (193 lb 5.5 oz)  SpO2 92%  Physical Exam: Obese female with mild tachypnea Lungs:  Expiratory prolonged wheezing  Cardiac:  Regular rhythm, normal S1 and S2, no S3 Extremities:  No edema present  Intake/Output from previous day: 06/16 0701 - 06/17 0700 In: 600 [P.O.:600] Out: 0  Weight Filed Weights   01/20/16 2027  Weight: 87.7 kg (193 lb 5.5 oz)    Lab Results: Basic Metabolic Panel:  Recent Labs  01/20/16 0838 01/21/16 0540  NA 125* 123*  K 4.4 4.9  CL 83* 84*  CO2 27 25  GLUCOSE 198* 175*  BUN 35* 50*  CREATININE 2.25* 2.15*    CBC:  Recent Labs  01/18/16 1134 01/19/16 0820 01/20/16 0838 01/21/16 0540  WBC 5.4 5.5 20.0* 13.0*  NEUTROABS 4104 3.9  --   --   HGB 9.8* 9.6* 9.4* 8.8*  HCT 28.5* 28.5* 28.4* 27.2*  MCV 96.0 96.9 97.3 98.6  PLT 97* 107* 104* 99*    BNP    Component Value Date/Time   BNP 72.2 01/19/2016 0820   BNP 43.2 12/17/2014 1204   Telemetry: Sinus rhythm, heart rate is slower at the present time  Assessment/Plan:  1.  Atrial arrhythmia that has improved-currently in sinus rhythm 2.  Chronic diastolic heart failure currently compensated 3.  COPD with exacerbation still with dyspnea 4.  Obesity  Recommendations:  Continue current medications and treatment of COPD.  Minimize adrenergic agents.     Kerry Hough  MD [facility]  01/21/2016, 8:13 AM

## 2016-01-21 NOTE — Progress Notes (Signed)
Pharmacy Antibiotic Note  [name] is a 70 y.o. female admitted on 01/19/2016 with sx of acute bronchitis.Marland Kitchen  Pharmacy has been consulted for Unasyn dosing for aspiration pneumonia.  WBC elevated to 13, afebrile. Poor renal function with CrCl ~25 ml/min.  Plan: Unasyn 3 g IV q12h (adjusted for renal function) Monitor renal function, LOT Adjust dose if CrCl improve to > 30 ml/min  Weight: 193 lb 5.5 oz (87.7 kg)  Temp (24hrs), Avg:98 F (36.7 C), Min:97.7 F (36.5 C), Max:98.3 F (36.8 C)   Recent Labs Lab 01/18/16 1134 01/19/16 0820 01/20/16 0838 01/21/16 0540  WBC 5.4 5.5 20.0* 13.0*  CREATININE 1.24* 1.35* 2.25* 2.15*    Estimated Creatinine Clearance: 25.4 mL/min (by C-G formula based on Cr of 2.15).    Allergies  Allergen Reactions  . Atorvastatin Other (See Comments)    Doesn't remember  . Diphenhydramine Hcl (Sleep) Hives  . Hydrocodone-Acetaminophen Other (See Comments)    Doesn't remember  . Lopid [Gemfibrozil] Other (See Comments)    Doesn't remember  . Loratadine Hives  . Lorazepam Hives  . Simvastatin Other (See Comments)  . Sulfamethoxazole Hives  . Sulfonamide Derivatives Hives    Antimicrobials this admission: Unasyn 6/17 >>    Dose adjustments this admission: n/a  Microbiology results: none  Thank you for allowing pharmacy to be a part of this patient's care.  Joya San, PharmD Clinical Pharmacy Resident Pager # [phone] 01/21/2016 1:43 PM

## 2016-01-21 NOTE — Plan of Care (Signed)
Problem: Fluid Volume: Goal: Ability to maintain a balanced intake and output will improve Outcome: Progressing MD ordered ABG and CXR. Monitoring wheezes. Reapiratory treatments every 4 hours. Generalized edema. Monitoring strict intake and output.

## 2016-01-22 ENCOUNTER — Inpatient Hospital Stay (HOSPITAL_COMMUNITY): Payer: PPO

## 2016-01-22 ENCOUNTER — Other Ambulatory Visit: Payer: Self-pay

## 2016-01-22 DIAGNOSIS — J9611 Chronic respiratory failure with hypoxia: Secondary | ICD-10-CM | POA: Diagnosis not present

## 2016-01-22 DIAGNOSIS — J9622 Acute and chronic respiratory failure with hypercapnia: Secondary | ICD-10-CM | POA: Diagnosis not present

## 2016-01-22 DIAGNOSIS — J441 Chronic obstructive pulmonary disease with (acute) exacerbation: Secondary | ICD-10-CM

## 2016-01-22 DIAGNOSIS — R079 Chest pain, unspecified: Secondary | ICD-10-CM | POA: Diagnosis not present

## 2016-01-22 DIAGNOSIS — N179 Acute kidney failure, unspecified: Secondary | ICD-10-CM | POA: Diagnosis not present

## 2016-01-22 DIAGNOSIS — I480 Paroxysmal atrial fibrillation: Secondary | ICD-10-CM | POA: Diagnosis not present

## 2016-01-22 DIAGNOSIS — E871 Hypo-osmolality and hyponatremia: Secondary | ICD-10-CM | POA: Diagnosis not present

## 2016-01-22 DIAGNOSIS — D696 Thrombocytopenia, unspecified: Secondary | ICD-10-CM | POA: Diagnosis not present

## 2016-01-22 DIAGNOSIS — J690 Pneumonitis due to inhalation of food and vomit: Secondary | ICD-10-CM | POA: Diagnosis not present

## 2016-01-22 DIAGNOSIS — R0789 Other chest pain: Secondary | ICD-10-CM | POA: Diagnosis not present

## 2016-01-22 DIAGNOSIS — J440 Chronic obstructive pulmonary disease with acute lower respiratory infection: Secondary | ICD-10-CM | POA: Diagnosis not present

## 2016-01-22 DIAGNOSIS — I4581 Long QT syndrome: Secondary | ICD-10-CM | POA: Diagnosis not present

## 2016-01-22 DIAGNOSIS — J9621 Acute and chronic respiratory failure with hypoxia: Secondary | ICD-10-CM | POA: Diagnosis not present

## 2016-01-22 LAB — TROPONIN I
TROPONIN I: 0.45 ng/mL — AB (ref <0.031)
Troponin I: 0.41 ng/mL — ABNORMAL HIGH (ref <0.031)
Troponin I: 0.39 ng/mL — ABNORMAL HIGH (ref <0.031)

## 2016-01-22 LAB — BASIC METABOLIC PANEL
Anion gap: 11 (ref 5–15)
BUN: 53 mg/dL — AB (ref 6–20)
CO2: 29 mmol/L (ref 22–32)
CREATININE: 1.81 mg/dL — AB (ref 0.44–1.00)
Calcium: 9 mg/dL (ref 8.9–10.3)
Chloride: 86 mmol/L — ABNORMAL LOW (ref 101–111)
GFR, EST AFRICAN AMERICAN: 32 mL/min — AB (ref >60)
GFR, EST NON AFRICAN AMERICAN: 27 mL/min — AB (ref >60)
Glucose, Bld: 143 mg/dL — ABNORMAL HIGH (ref 65–99)
Potassium: 4.2 mmol/L (ref 3.5–5.1)
SODIUM: 126 mmol/L — AB (ref 135–145)

## 2016-01-22 LAB — GLUCOSE, CAPILLARY
Glucose-Capillary: 140 mg/dL — ABNORMAL HIGH (ref 65–99)
Glucose-Capillary: 205 mg/dL — ABNORMAL HIGH (ref 65–99)
Glucose-Capillary: 197 mg/dL — ABNORMAL HIGH (ref 65–99)
Glucose-Capillary: 285 mg/dL — ABNORMAL HIGH (ref 65–99)

## 2016-01-22 MED ORDER — LEVALBUTEROL HCL 0.63 MG/3ML IN NEBU
0.6300 mg | INHALATION_SOLUTION | Freq: Four times a day (QID) | RESPIRATORY_TRACT | Status: DC
  Administered 2016-01-23 (×2): 0.63 mg via RESPIRATORY_TRACT
  Filled 2016-01-22 (×3): qty 3

## 2016-01-22 MED ORDER — IPRATROPIUM BROMIDE 0.02 % IN SOLN
0.5000 mg | Freq: Four times a day (QID) | RESPIRATORY_TRACT | Status: DC
  Administered 2016-01-23 (×2): 0.5 mg via RESPIRATORY_TRACT
  Filled 2016-01-22 (×3): qty 2.5

## 2016-01-22 MED ORDER — LEVALBUTEROL HCL 1.25 MG/0.5ML IN NEBU: 1.2500 mg | INHALATION_SOLUTION | Freq: Four times a day (QID) | RESPIRATORY_TRACT | Status: DC | PRN

## 2016-01-22 MED ORDER — METHYLPREDNISOLONE SODIUM SUCC 125 MG IJ SOLR
60.0000 mg | Freq: Two times a day (BID) | INTRAMUSCULAR | Status: DC
  Administered 2016-01-23: 60 mg via INTRAVENOUS
  Filled 2016-01-22 (×2): qty 2

## 2016-01-22 MED ORDER — FENTANYL CITRATE (PF) 100 MCG/2ML IJ SOLN
12.5000 ug | Freq: Once | INTRAMUSCULAR | Status: AC
  Administered 2016-01-22: 12.5 ug via INTRAVENOUS
  Filled 2016-01-22: qty 2

## 2016-01-22 MED ORDER — ISOSORBIDE MONONITRATE ER 30 MG PO TB24
30.0000 mg | ORAL_TABLET | Freq: Every day | ORAL | Status: DC
  Administered 2016-01-22 – 2016-01-24 (×3): 30 mg via ORAL
  Filled 2016-01-22 (×3): qty 1

## 2016-01-22 NOTE — Progress Notes (Signed)
PROGRESS NOTE                                                                                                                                                                                                             Patient Demographics:    [name], is a 70 y.o. female, DOB - [birth year], OZH:[id number]  Admit date - 01/19/2016   Admitting Physician Albertine Patricia, MD  Outpatient Primary MD for the patient is Alesia Richards, MD  LOS - 3  Outpatient Specialists: none  Chief Complaint  Patient presents with  . Shortness of Breath       Brief Narrative  70 year old female with [name] cirrhosis and varices, CK D, COPD on chronic home O2 (3 L), fibromyalgia, see daily stage III seen at PCP office with progressive weakness and shortness of breath. Blood work done showed significant hyponatremia and hypokalemia and was sent to the ED. Patient was started on azithromycin for acute bronchitis symptoms by her PCP without much help. In the ED she was significantly short of breath with wheezing and developed tachycardia after receiving treatment with albuterol and went into transient A. fib. Patient also became hypotensive in the ED with systolic blood pressure in the 80s. Blood work showed CO2 retention. Sodium of 125 and potassium of 3 along with cytopenia at baseline. Admitted to telemetry for acute on chronic respiratory failure due to COPD exacerbation and cardiology consulted.    Subjective:   Complaint of substernal chest pain. EKG unremarkable. Had mildly elevated troponin. No further symptoms this morning. Breathing better.   Assessment  & Plan :    Principal problem  acute on chronic hypoxic and hypercapnic respiratory failure Secondary to COPD exacerbation with acute bronchitis.Wheezing improved. - scheduled IV Solu-Medrol,  scheduled Xopenex and Atrovent nebs . Maintaining sats on 3 L. Repeat ABG  shows mild hypercapnia. Continue antitussives.   Right lobar pneumonia As seen on chest x-ray. Possible for mild aspiration. Empiric Unasyn.   Acute on chronic kidney disease stage III (baseline creatinine 1.3-1.4) Possibly prerenal with hypotension. Held Lasix, Zaroxolyn and metformin admission. Fluid now discontinued and given a dose of IV Lasix. Renal function improved this morning.  Hyponatremia Received IV fluids initially suspicion for hypovolemia. Now kept on fluid restriction and received a dose of IV Lasix yesterday. Sodium slowly improving.  Chest pain  on 6/17. EKG unremarkable but cannot rule out possible angina. Troponin peaked at 0.45. Appreciate cardiology evaluation. Recommend to cycle enzymes, and Imdur and sublingual nitrate. Continue telemetry monitoring.  A. fib versus MAT Likely transient due to albuterol use. Heart rate better controlled. Appreciate cardiology follow-up.  Hypokalemia Repleted. Normal magnesium.  Prolonged QTC >600 on EKG. normal K and magnesium. Avoid Qt longing agents (Celexa and azithromycin discontinued). QTC normalized on subsequent EKG.   NASH cirrhosis Holding Aldactone. Continue lactulose at home dose.  Chronic diastolic CHF Hypovolemic on presentation. DC fluids. Holding diuretics, resume once his renal function returns to baseline.  Type 2 diabetes mellitus, uncontrolled Holding metformin. CBG elevated due to steroid use. Added Lantus. Continue moderate sliding scale coverage.  Chronic Thrombcytopenia Secondary to cirrhosis. Stable.  Acute gouty arthritis Cannot use NSAIDs or colchicine given her renal insufficiency. Empiric Solu-Medrol which showed improve her symptoms. Added when necessary Percocet . Symptoms better today.    Leukocytosis Possibly due to steroid use.   Physical deconditioning  PT evaluation   Code Status : Full code  Family Communication  : Husband at bedside  Disposition Plan  : Home once  clinically improved,   Barriers For Discharge : Active symptoms  Consults  :  Cardiology  Procedures  : None  DVT Prophylaxis  :SCDs   Lab Results  Component Value Date   PLT 99* 01/21/2016    Antibiotics  :    Anti-infectives    Start     Dose/Rate Route Frequency Ordered Stop   01/21/16 1345  Ampicillin-Sulbactam (UNASYN) 3 g in sodium chloride 0.9 % 100 mL IVPB  Status:  Discontinued     3 g 100 mL/hr over 60 Minutes Intravenous Every 6 hours 01/21/16 1340 01/21/16 1341   01/21/16 1345  Ampicillin-Sulbactam (UNASYN) 3 g in sodium chloride 0.9 % 100 mL IVPB     3 g 100 mL/hr over 60 Minutes Intravenous Every 12 hours 01/21/16 1341     01/19/16 1430  cefTRIAXone (ROCEPHIN) 1 g in dextrose 5 % 50 mL IVPB  Status:  Discontinued     1 g 100 mL/hr over 30 Minutes Intravenous Every 24 hours 01/19/16 1421 01/21/16 1331        Objective:   Filed Vitals:   01/22/16 0648 01/22/16 0713 01/22/16 0846 01/22/16 1153  BP: 132/65  141/66   Pulse: 82  88   Temp:   97.7 F (36.5 C)   TempSrc:   Oral   Resp: 22  18   Weight:      SpO2: 98% 97% 98% 90%    Wt Readings from Last 3 Encounters:  01/20/16 87.7 kg (193 lb 5.5 oz)  01/18/16 81.647 kg (180 lb)  12/15/15 80.343 kg (177 lb 2 oz)     Intake/Output Summary (Last 24 hours) at 01/22/16 1424 Last data filed at 01/22/16 1047  Gross per 24 hour  Intake   1040 ml  Output   1550 ml  Net   -510 ml     Physical Exam  Gen: not in distress, Appears fatigued HEENT: no pallor, moist mucosa, supple neck, no JVD Chest: Clear breath sounds bilaterally, no wheezing or rhonchi CVS: N S1&S2, no murmurs, rubs or gallop GI: soft, NT, ND, BS+ Musculoskeletal: warm, no edema, short right arm CNS: Alert and oriented    Data Review:    CBC  Recent Labs Lab 01/18/16 1134 01/19/16 0820 01/20/16 0838 01/21/16 0540  WBC 5.4 5.5 20.0* 13.0*  HGB  9.8* 9.6* 9.4* 8.8*  HCT 28.5* 28.5* 28.4* 27.2*  PLT 97* 107* 104* 99*  MCV  96.0 96.9 97.3 98.6  MCH 33.0 32.7 32.2 31.9  MCHC 34.4 33.7 33.1 32.4  RDW 15.5* 14.4 14.8 15.3  LYMPHSABS 702* 1.1  --   --   MONOABS 378 0.3  --   --   EOSABS 216 0.3  --   --   BASOSABS 0 0.0  --   --     Chemistries   Recent Labs Lab 01/18/16 1134 01/19/16 0820 01/20/16 0838 01/21/16 0540 01/21/16 1355 01/22/16 0403  NA 120* 125* 125* 123*  --  126*  K 2.7* 3.0* 4.4 4.9  --  4.2  CL <80* 74* 83* 84*  --  86*  CO2 27 34* 27 25  --  29  GLUCOSE 104* 142* 198* 175*  --  143*  BUN 22 20 35* 50*  --  53*  CREATININE 1.24* 1.35* 2.25* 2.15*  --  1.81*  CALCIUM 9.2 9.6 8.8* 8.8*  --  9.0  MG 1.9 2.1  --   --  2.6*  --   AST 79* 110* 92*  --   --   --   ALT 23 26 31   --   --   --   ALKPHOS 80 77 72  --   --   --   BILITOT 1.0 1.1 0.8  --   --   --    ------------------------------------------------------------------------------------------------------------------ No results for input(s): CHOL, HDL, LDLCALC, TRIG, CHOLHDL, LDLDIRECT in the last 72 hours.  Lab Results  Component Value Date   HGBA1C 6.2* 01/18/2016   ------------------------------------------------------------------------------------------------------------------ No results for input(s): TSH, T4TOTAL, T3FREE, THYROIDAB in the last 72 hours.  Invalid input(s): FREET3 ------------------------------------------------------------------------------------------------------------------ No results for input(s): VITAMINB12, FOLATE, FERRITIN, TIBC, IRON, RETICCTPCT in the last 72 hours.  Coagulation profile No results for input(s): INR, PROTIME in the last 168 hours.  No results for input(s): DDIMER in the last 72 hours.  Cardiac Enzymes  Recent Labs Lab 01/22/16 0703 01/22/16 1054  TROPONINI 0.45* 0.41*   ------------------------------------------------------------------------------------------------------------------    Component Value Date/Time   BNP 72.2 01/19/2016 0820   BNP 43.2 12/17/2014 1204      Inpatient Medications  Scheduled Meds: . allopurinol  100 mg Oral Daily  . ampicillin-sulbactam (UNASYN) IV  3 g Intravenous Q12H  . aspirin  81 mg Oral Daily  . bisoprolol  10 mg Oral Daily  . ferrous sulfate  325 mg Oral Daily  . insulin aspart  0-15 Units Subcutaneous TID WC  . insulin aspart  0-5 Units Subcutaneous QHS  . insulin glargine  15 Units Subcutaneous QHS  . ipratropium  0.5 mg Nebulization Q4H  . isosorbide mononitrate  30 mg Oral Daily  . lactulose  30 g Oral Daily  . levalbuterol  0.63 mg Nebulization Q4H  . magnesium gluconate  250 mg Oral Q0600  . methylPREDNISolone (SOLU-MEDROL) injection  60 mg Intravenous Q8H  . pantoprazole  40 mg Oral Daily  . potassium chloride  20 mEq Oral BID  . sodium chloride flush  3 mL Intravenous Q12H  . tamoxifen  20 mg Oral Daily   Continuous Infusions:  PRN Meds:.  Micro Results No results found for this or any previous visit (from the past 240 hour(s)).  Radiology Reports Dg Chest Port 1 View  01/22/2016  CLINICAL DATA:  Chest pain. EXAM: PORTABLE CHEST 1 VIEW COMPARISON:  Radiograph of January 21, 2016. FINDINGS: The  heart size and mediastinal contours are within normal limits. No pneumothorax is noted. Increased right basilar opacity is noted concerning for worsening pneumonia. Stable mild left basilar opacity is noted concerning for atelectasis or pneumonia. No significant pleural effusion is noted. Status post right shoulder arthroplasty and surgical fusion of cervical spine. IMPRESSION: Increased right basilar opacity is noted concerning for worsening pneumonia. Stable left basilar subsegmental atelectasis or pneumonia. Electronically Signed   By: Marijo Conception, M.D.   On: 01/22/2016 07:17   Dg Chest Port 1 View  01/21/2016  CLINICAL DATA:  Shortness of breath EXAM: PORTABLE CHEST 1 VIEW COMPARISON:  01/19/2016 chest radiograph. FINDINGS: Surgical hardware overlies the visualized cervical spine and right proximal humerus.  Stable cardiomediastinal silhouette with mild cardiomegaly. No pneumothorax. No pleural effusion. Patchy consolidation at both lung bases is new. No pulmonary edema. IMPRESSION: New patchy bibasilar lung consolidation suspicious for aspiration and/ or pneumonia. Electronically Signed   By: Ilona Sorrel M.D.   On: 01/21/2016 11:56   Dg Chest Port 1 View  01/19/2016  CLINICAL DATA:  Shortness of breath and weakness for 2 days, history hypertension, former smoker, COPD, fibromyalgia, cirrhosis EXAM: PORTABLE CHEST 1 VIEW COMPARISON:  Portable exam 0937 hours compared to 09/21/2015 FINDINGS: Normal heart size, mediastinal contours, and pulmonary vascularity. Lungs clear. No pleural effusion or pneumothorax. Prior cervical spine fusion and RIGHT shoulder arthroplasty. IMPRESSION: No acute abnormalities. Electronically Signed   By: Lavonia Dana M.D.   On: 01/19/2016 08:48    Time Spent in minutes  25   Louellen Molder M.D on 01/22/2016 at 2:24 PM  Between 7am to 7pm - Pager - [phone]  After 7pm go to [email or web address] - password [facility]  [facility] -  Office  [phone]

## 2016-01-22 NOTE — Progress Notes (Addendum)
Subjective:  Currently sitting on the bedside commode but states she feels better with no acute changes.  Respiratory status is better.  She had an episode of substernal chest discomfort last evening described as somebody sitting on her chest and then became stinging.  An EKG showed a right bundle branch block with normal sinus rhythm.  Troponins were cycled and the first one is 0.45.  She feels fine today.  Objective:  Vital Signs in the last 24 hours: BP 141/66 mmHg  Pulse 88  Temp(Src) 97.7 F (36.5 C) (Oral)  Resp 18  Wt 87.7 kg (193 lb 5.5 oz)  SpO2 98%  Physical Exam: Obese female currently in no acute distress.   Lungs:  Expiratory prolonged wheezing  Cardiac:  Regular rhythm, normal S1 and S2, no S3 Extremities:  No edema present  Intake/Output from previous day: 06/17 0701 - 06/18 0700 In: 1280 [P.O.:1080; IV Piggyback:200] Out: 1300 [Urine:1300] Weight Filed Weights   01/20/16 2027  Weight: 87.7 kg (193 lb 5.5 oz)    Lab Results: Basic Metabolic Panel:  Recent Labs  01/21/16 0540 01/22/16 0403  NA 123* 126*  K 4.9 4.2  CL 84* 86*  CO2 25 29  GLUCOSE 175* 143*  BUN 50* 53*  CREATININE 2.15* 1.81*    CBC:  Recent Labs  01/20/16 0838 01/21/16 0540  WBC 20.0* 13.0*  HGB 9.4* 8.8*  HCT 28.4* 27.2*  MCV 97.3 98.6  PLT 104* 99*    BNP    Component Value Date/Time   BNP 72.2 01/19/2016 0820   BNP 43.2 12/17/2014 1204   Telemetry: Sinus rhythm, heart rate is slower at the present time  Assessment/Plan:   1.  Prolonged chest discomfort last night that could have been angina.  This could be demand exacerbated by anemia as well as her current respiratory status.  I would continue to cycle troponins and place her on some nitrates.   2.  Chronic diastolic heart failure currently compensated 3.  COPD with exacerbation still with dyspnea 4.  Obesity 5.  History of atrial arrhythmia-currently in sinus rhythm  Recommendations:  Continue to cycle  troponins, add Imdur, nitroglycerin at bedside to use.  Repeat EKG.     Kerry Hough  MD [facility]  01/22/2016, 11:27 AM

## 2016-01-22 NOTE — Evaluation (Signed)
Occupational Therapy Evaluation Patient Details Name: [name] MRN: [id number] DOB: [birth year] Today's Date: 01/22/2016    History of Present Illness 70 yo female with onset of COPD flare and chronic O2 use 3L at home.  Low BP and current L ankle gouty flare.  PMHx:  encephalopathy, NASH cirrhosis, thrombocytopenia, B hip surgeries,    Clinical Impression   Pt admitted with above. She demonstrates the below listed deficits and will benefit from continued OT to maximize safety and independence with BADLs.  Pt presents to OT with significant deconditioning and poor activity tolerance.  She demonstrates DOE 4/4 with minimal ADL activity.  Overall, she requires mod- max A for ADLs.  Spouse is supportive and has been assisting her at home and both pt and spouse wish for discharge home.  They have all needed DME.  Recommend HHOT      Follow Up Recommendations  Home health OT;Supervision/Assistance - 24 hour    Equipment Recommendations  None recommended by OT    Recommendations for Other Services       Precautions / Restrictions Precautions Precautions: Fall      Mobility Bed Mobility                  Transfers Overall transfer level: Needs assistance Equipment used: Rolling walker (2 wheeled) Transfers: Sit to/from [employer] Sit to Stand: Min assist Stand pivot transfers: Min assist       General transfer comment: Min A to move into standing and min A for balance     Balance Overall balance assessment: Needs assistance Sitting-balance support: Feet supported Sitting balance-Leahy Scale: Fair     Standing balance support: Bilateral upper extremity supported Standing balance-Leahy Scale: Poor Standing balance comment: reliant on UE support                             ADL Overall ADL's : Needs assistance/impaired Eating/Feeding: Set up;Sitting   Grooming: Wash/dry hands;Wash/dry face;Oral care;Brushing hair;Minimal  assistance;Sitting   Upper Body Bathing: Moderate assistance;Sitting   Lower Body Bathing: Maximal assistance;Sit to/from stand   Upper Body Dressing : Sitting;Maximal assistance   Lower Body Dressing: Total assistance;Sit to/from stand   Toilet Transfer: Minimal assistance;+2 for safety/equipment;Ambulation;Comfort height toilet;Grab bars;RW   Toileting- Clothing Manipulation and Hygiene: Total assistance;Sit to/from stand       Functional mobility during ADLs: Minimal assistance;+2 for safety/equipment;Rolling walker General ADL Comments: Pt fatigues quickly with minimal activity thus requiring assistance.  Spouse is very supportive      Estate agent      Pertinent Vitals/Pain Pain Assessment: Faces Faces Pain Scale: No hurt     Hand Dominance Right   Extremity/Trunk Assessment Upper Extremity Assessment Upper Extremity Assessment: Generalized weakness   Lower Extremity Assessment Lower Extremity Assessment: Generalized weakness   Cervical / Trunk Assessment Cervical / Trunk Assessment: Other exceptions Cervical / Trunk Exceptions: Pt maintains flexed posture    Communication Communication Communication: No difficulties   Cognition Arousal/Alertness: Awake/alert Behavior During Therapy: Anxious Overall Cognitive Status: Within Functional Limits for tasks assessed                     General Comments       Exercises       Shoulder Instructions      Home Living Family/patient expects to be discharged to:: Private residence Living Arrangements: Spouse/significant other Available  Help at Discharge: Family;Available 24 hours/day Type of Home: House Home Access: Stairs to enter [employer] of Steps: 2 Entrance Stairs-Rails: Right Home Layout: One level     Bathroom Shower/Tub: Occupational psychologist: Standard Bathroom Accessibility: Yes   Home Equipment: Environmental consultant - 2 wheels;Bedside commode;Shower  seat;Electric scooter;Other (comment)          Prior Functioning/Environment Level of Independence: Needs assistance  Gait / Transfers Assistance Needed: ambulates with RW mod I ADL's / Homemaking Assistance Needed: Spouse assists her with LB ADLs, as well as shower transfer.  She performs toileting mod I.  Spouse does all IADLs    Comments: uses RW household distances and power chair for community distances    OT Diagnosis: Generalized weakness   OT Problem List: Decreased strength;Decreased activity tolerance;Impaired balance (sitting and/or standing);Decreased safety awareness;Decreased knowledge of use of DME or AE;Cardiopulmonary status limiting activity;Obesity   OT Treatment/Interventions: Self-care/ADL training;Therapeutic exercise;Energy conservation;DME and/or AE instruction;Therapeutic activities;Patient/family education;Balance training    OT Goals(Current goals can be found in the care plan section) Acute Rehab OT Goals Patient Stated Goal: to go home  OT Goal Formulation: With patient/family Time For Goal Achievement: 02/05/16 Potential to Achieve Goals: Good ADL Goals Pt Will Perform Upper Body Bathing: with supervision;sitting Pt Will Perform Upper Body Dressing: with supervision;sitting Pt Will Transfer to Toilet: with min guard assist;ambulating;regular height toilet;grab bars;bedside commode Pt Will Perform Toileting - Clothing Manipulation and hygiene: with min guard assist;sit to/from stand Pt/caregiver will Perform Home Exercise Program: Increased strength;Right Upper extremity;Left upper extremity;With Supervision;With written HEP provided;With theraband  OT Frequency: Min 2X/week   Barriers to D/C:            Co-evaluation              End of Session Equipment Utilized During Treatment: Oxygen;Rolling walker Nurse Communication: Mobility status  Activity Tolerance: Patient limited by fatigue Patient left: in chair;with call bell/phone within  reach;with family/visitor present   Time: 2919-1660 OT Time Calculation (min): 18 min Charges:  OT General Charges $OT Visit: 1 Procedure OT Evaluation $OT Eval Moderate Complexity: 1 Procedure G-Codes:    Deja Kaigler M 2016-02-06, 5:04 PM

## 2016-01-22 NOTE — Progress Notes (Signed)
[facility] progress note. Chief complaint. Chest pain. History of present illness. This elderly female in hospital with acute on chronic hypoxic and hypercapnic respiratory failure with right lobar pneumonia. She complained to nursing of left upper chest pain just over the left breast which she describes as burning and then pain radiating to the back. No pain radiating to the left jaw or left arm. No diaphoresis or nausea associated. The patient has no listed history of coronary artery disease. Physical exam. Vital signs. Temperature 97.7, pulse 81, respiration 22, blood pressure 131/73. O2 sats 100%. General appearance. Obese female who is alert and in no distress. Cardiac. Rate and rhythm regular. Lungs. Breath sounds reduced but clear. Abdomen. Obese and protuberant with mild diffuse pain. Impression/plan. Problem #1. Chest pain. 12-lead EKG shows a bundle branch block which is not new. There is no evidence of acute ischemia observed. Nonetheless, I suspect the patient has several risk factors and I think it's reasonable to obtain troponin now and then every 6 hours for total of 3 sets. I will also repeat an EKG in 6 hours to evaluate for any changes.

## 2016-01-23 ENCOUNTER — Other Ambulatory Visit: Payer: Self-pay

## 2016-01-23 DIAGNOSIS — D696 Thrombocytopenia, unspecified: Secondary | ICD-10-CM | POA: Diagnosis not present

## 2016-01-23 DIAGNOSIS — E871 Hypo-osmolality and hyponatremia: Secondary | ICD-10-CM | POA: Diagnosis not present

## 2016-01-23 DIAGNOSIS — J440 Chronic obstructive pulmonary disease with acute lower respiratory infection: Secondary | ICD-10-CM | POA: Diagnosis not present

## 2016-01-23 DIAGNOSIS — J9621 Acute and chronic respiratory failure with hypoxia: Secondary | ICD-10-CM | POA: Diagnosis not present

## 2016-01-23 DIAGNOSIS — I471 Supraventricular tachycardia: Secondary | ICD-10-CM | POA: Diagnosis not present

## 2016-01-23 DIAGNOSIS — J690 Pneumonitis due to inhalation of food and vomit: Secondary | ICD-10-CM | POA: Diagnosis not present

## 2016-01-23 DIAGNOSIS — J9620 Acute and chronic respiratory failure, unspecified whether with hypoxia or hypercapnia: Secondary | ICD-10-CM | POA: Diagnosis not present

## 2016-01-23 DIAGNOSIS — E1122 Type 2 diabetes mellitus with diabetic chronic kidney disease: Secondary | ICD-10-CM | POA: Diagnosis not present

## 2016-01-23 DIAGNOSIS — J441 Chronic obstructive pulmonary disease with (acute) exacerbation: Secondary | ICD-10-CM | POA: Diagnosis not present

## 2016-01-23 DIAGNOSIS — J9622 Acute and chronic respiratory failure with hypercapnia: Secondary | ICD-10-CM | POA: Diagnosis not present

## 2016-01-23 DIAGNOSIS — N183 Chronic kidney disease, stage 3 (moderate): Secondary | ICD-10-CM | POA: Diagnosis not present

## 2016-01-23 DIAGNOSIS — N179 Acute kidney failure, unspecified: Secondary | ICD-10-CM | POA: Diagnosis present

## 2016-01-23 DIAGNOSIS — J9611 Chronic respiratory failure with hypoxia: Secondary | ICD-10-CM | POA: Diagnosis not present

## 2016-01-23 DIAGNOSIS — R0789 Other chest pain: Secondary | ICD-10-CM | POA: Diagnosis not present

## 2016-01-23 DIAGNOSIS — I4581 Long QT syndrome: Secondary | ICD-10-CM | POA: Diagnosis not present

## 2016-01-23 LAB — BASIC METABOLIC PANEL
ANION GAP: 9 (ref 5–15)
BUN: 48 mg/dL — ABNORMAL HIGH (ref 6–20)
CALCIUM: 8.8 mg/dL — AB (ref 8.9–10.3)
CO2: 29 mmol/L (ref 22–32)
Chloride: 92 mmol/L — ABNORMAL LOW (ref 101–111)
Creatinine, Ser: 1.52 mg/dL — ABNORMAL HIGH (ref 0.44–1.00)
GFR calc Af Amer: 39 mL/min — ABNORMAL LOW (ref >60)
GFR, EST NON AFRICAN AMERICAN: 34 mL/min — AB (ref >60)
GLUCOSE: 135 mg/dL — AB (ref 65–99)
POTASSIUM: 4.2 mmol/L (ref 3.5–5.1)
SODIUM: 130 mmol/L — AB (ref 135–145)

## 2016-01-23 LAB — GLUCOSE, CAPILLARY
GLUCOSE-CAPILLARY: 133 mg/dL — AB (ref 65–99)
GLUCOSE-CAPILLARY: 180 mg/dL — AB (ref 65–99)
Glucose-Capillary: 145 mg/dL — ABNORMAL HIGH (ref 65–99)
Glucose-Capillary: 174 mg/dL — ABNORMAL HIGH (ref 65–99)

## 2016-01-23 MED ORDER — SODIUM CHLORIDE 0.9 % IV SOLN
3.0000 g | Freq: Three times a day (TID) | INTRAVENOUS | Status: DC
  Administered 2016-01-23 – 2016-01-24 (×2): 3 g via INTRAVENOUS
  Filled 2016-01-23 (×4): qty 3

## 2016-01-23 MED ORDER — FUROSEMIDE 40 MG PO TABS
40.0000 mg | ORAL_TABLET | Freq: Once | ORAL | Status: AC
  Administered 2016-01-23: 40 mg via ORAL
  Filled 2016-01-23: qty 1

## 2016-01-23 MED ORDER — LEVALBUTEROL HCL 0.63 MG/3ML IN NEBU
0.6300 mg | INHALATION_SOLUTION | Freq: Two times a day (BID) | RESPIRATORY_TRACT | Status: DC
  Administered 2016-01-23: 0.63 mg via RESPIRATORY_TRACT
  Filled 2016-01-23 (×2): qty 3

## 2016-01-23 MED ORDER — PREDNISONE 50 MG PO TABS
50.0000 mg | ORAL_TABLET | Freq: Every day | ORAL | Status: DC
  Administered 2016-01-24: 50 mg via ORAL
  Filled 2016-01-23: qty 1

## 2016-01-23 MED ORDER — IPRATROPIUM BROMIDE 0.02 % IN SOLN
0.5000 mg | Freq: Three times a day (TID) | RESPIRATORY_TRACT | Status: DC
  Administered 2016-01-23: 0.5 mg via RESPIRATORY_TRACT
  Filled 2016-01-23 (×2): qty 2.5

## 2016-01-23 NOTE — Evaluation (Signed)
Clinical/Bedside Swallow Evaluation Patient Details  Name: [name] MRN: [id number] Date of Birth: Sep 30, 1945  Today's Date: 01/23/2016 Time:        Past Medical History:  Past Medical History  Diagnosis Date  . Hypertension   . Hyperlipidemia   . COPD (chronic obstructive pulmonary disease) ([place])   . Vitamin D deficiency   . IBS (irritable bowel syndrome)   . Fibromyalgia   . Hepatic cirrhosis ([place])   . Esophageal varices ([place])   . Hepatic encephalopathy ([place])   . Gastritis   . Splenomegaly    Past Surgical History:  Past Surgical History  Procedure Laterality Date  . Hip arthroplasty Bilateral   . Shoulder surgery Right   . Back surgery    . Cholecystectomy    . Neck surgery    . Esophagogastroduodenoscopy  07/16/2012    Procedure: ESOPHAGOGASTRODUODENOSCOPY (EGD);  Surgeon: Inda Castle, MD;  Location: [facility];  Service: Endoscopy;  Laterality: N/A;  . Gastric varices banding  07/16/2012    Procedure: GASTRIC VARICES BANDING;  Surgeon: Inda Castle, MD;  Location: [facility];  Service: Endoscopy;  Laterality: N/A;  . Abdominal hysterectomy     HPI:  70 y.o. female,with a past medical history significant for NASH cirrhosis with varices, esophageal varices, gastritis, HTN, fibromyalgia, CKD, and COPD on chronic home O2 who presents with shortness of breath and weakness. Diagnosis acute on chronic hypoxic hypercapnic respiratory failure secondary to COPD exacerbation. CXR increased right basilar opacity is noted concerning for worsening pneumonia. Stable left basilar subsegmental atelectasis or pneumonia. BSE 08/08/15 with normal swallow function and difficulty orally transiting pills (longstanding).   Assessment / Plan / Recommendation Clinical Impression  Pt and spouse admit to coughing at home with meals (mild). Prior to po's pt wheezing slightly and dyspneic. Pt with pharygneal globus sensation (pt reports GERD) and vocal hoarseness (prior to  hospitalization). She would benefit from [facility] given COPD, intermittent dysphagia and esophageal history with GERD and varices. Continue current regular texture, thin liquids until MBS 6/20 (try to schedule in am).      Aspiration Risk  Moderate aspiration risk    Diet Recommendation Regular;Thin liquid   Medication Administration: Whole meds with puree Supervision: Patient able to self feed;Intermittent supervision to cue for compensatory strategies Compensations: Slow rate;Small sips/bites    Other  Recommendations Oral Care Recommendations: Oral care BID   Follow up Recommendations   (TBD)    Frequency and Duration min 2x/week  2 weeks       Prognosis Prognosis for Safe Diet Advancement:  (fair-good)      Swallow Study   General HPI: 70 y.o. female,with a past medical history significant for NASH cirrhosis with varices, esophageal varices, gastritis, HTN, fibromyalgia, CKD, and COPD on chronic home O2 who presents with shortness of breath and weakness. Diagnosis acute on chronic hypoxic hypercapnic respiratory failure secondary to COPD exacerbation. CXR increased right basilar opacity is noted concerning for worsening pneumonia. Stable left basilar subsegmental atelectasis or pneumonia. BSE 08/08/15 with normal swallow function and difficulty orally transiting pills (longstanding). Previous Swallow Assessment:  (none) Respiratory Status: Nasal cannula History of Recent Intubation: No Behavior/Cognition: Alert;Cooperative;Pleasant mood Oral Care Completed by SLP: No Oral Cavity - Dentition: Dentures, top (natural lower) Vision: Functional for self-feeding Baseline Vocal Quality: Hoarse Volitional Cough: Strong Volitional Swallow: Able to elicit    Oral/Motor/Sensory Function Overall Oral Motor/Sensory Function: Within functional limits   Ice Chips Ice chips: Not tested   Thin Liquid  Thin Liquid: Impaired Presentation: Cup;Straw Oral Phase Impairments:  (none) Oral Phase  Functional Implications:  (none) Pharyngeal  Phase Impairments: Change in Vital Signs (increased respirations)    Nectar Thick Nectar Thick Liquid: Not tested   Honey Thick Honey Thick Liquid: Not tested   Puree Puree: Impaired Pharyngeal Phase Impairments:  (globus sensation )   Solid   GO   Solid: Not tested        Houston Siren 01/23/2016,3:21 PM   Orbie Pyo Colvin Caroli.Ed [employer] [phone]

## 2016-01-23 NOTE — Progress Notes (Addendum)
Occupational Therapy Treatment Patient Details Name: [name] MRN: [id number] DOB: 1945-11-23 Today's Date: 01/23/2016    History of present illness 70 y.o. female with onset of COPD flare and chronic O2 use 3L at home.  Low BP and current L ankle gouty flare.  PMHx:  encephalopathy, NASH cirrhosis, thrombocytopenia, B hip surgeries,    OT comments  Pt progressing. Continue to recommend [place] upon d/c. Feel pt will continue to benefit from acute OT to increase independence prior to d/c.   Follow Up Recommendations  Home health OT;Supervision/Assistance - 24 hour    Equipment Recommendations  None recommended by OT    Recommendations for Other Services      Precautions / Restrictions Precautions Precautions: Fall Restrictions Weight Bearing Restrictions: No       Mobility Bed Mobility Overal bed mobility: Needs Assistance Bed Mobility: Supine to Sit;Sit to Supine     Supine to sit: Supervision Sit to supine: Supervision      Transfers Overall transfer level: Needs assistance   Transfers: Sit to/from Stand Sit to Stand: Min guard         General transfer comment: RW in front of pt. Cues for hand placement.    Balance    Min guard for ambulation with RW.                                ADL Overall ADL's : Needs assistance/impaired     Grooming: Applying deodorant;Wash/dry hands;Brushing hair;Sitting;Set up;Supervision/safety (applied sanitizer to hands)   Upper Body Bathing: Moderate assistance;Sitting Upper Body Bathing Details (indicate cue type and reason): washed under armpits             Toilet Transfer: Min guard;Ambulation;RW;BSC   Toileting- Clothing Manipulation and Hygiene: Maximal assistance;Sit to/from stand       Functional mobility during ADLs: Min guard;Rolling walker-pt ambulated to sink. General ADL Comments: Instructed to elevate RUE. Mentioned using long handled sponge/loofah as well as alternative technique to  help reach under UE.  Educated on energy conservation.       Vision                     Perception     Praxis      Cognition  Awake/Alert Behavior During Therapy: WFL for tasks assessed/performed Overall Cognitive Status: Within Functional Limits for tasks assessed                       Extremity/Trunk Assessment               Exercises     Shoulder Instructions       General Comments      Pertinent Vitals/ Pain       Pain Assessment: 0-10 Pain Score: 8  Pain Location: LUE and back  Pain Descriptors / Indicators: Sore Pain Intervention(s): Monitored during session;Repositioned  Home Living                                          Prior Functioning/Environment              Frequency Min 2X/week     Progress Toward Goals  OT Goals(current goals can now be found in the care plan section)  Progress towards OT goals: Progressing toward goals-added a goal  Acute Rehab OT Goals Patient Stated Goal: not stated OT Goal Formulation: With patient/family Time For Goal Achievement: 02/05/16 Potential to Achieve Goals: Good ADL Goals Pt Will Perform Grooming: with min assist;standing (3 tasks while standing) Pt Will Perform Upper Body Bathing: with supervision;sitting Pt Will Perform Upper Body Dressing: with supervision;sitting Pt Will Transfer to Toilet: with min guard assist;ambulating;regular height toilet;grab bars;bedside commode Pt Will Perform Toileting - Clothing Manipulation and hygiene: with min guard assist;sit to/from stand Pt/caregiver will Perform Home Exercise Program: Increased strength;Right Upper extremity;Left upper extremity;With Supervision;With written HEP provided;With theraband  Plan Discharge plan remains appropriate    Co-evaluation                 End of Session Equipment Utilized During Treatment: Oxygen;Gait belt;Rolling walker   Activity Tolerance Patient limited by pain;Patient  limited by fatigue   Patient Left in bed;with call bell/phone within reach;with bed alarm set   Nurse Communication          Time: [phone] OT Time Calculation (min): 20 min  Charges: OT General Charges $OT Visit: 1 Procedure OT Treatments $Self Care/Home Management : 8-22 mins  Dujuan Stankowski L 01/23/2016, 10:07 AM

## 2016-01-23 NOTE — Progress Notes (Signed)
Patient Name: [name] Date of Encounter: 01/23/2016   SUBJECTIVE  No further chest pain. SOB stable.   CURRENT MEDS . allopurinol  100 mg Oral Daily  . ampicillin-sulbactam (UNASYN) IV  3 g Intravenous Q12H  . aspirin  81 mg Oral Daily  . bisoprolol  10 mg Oral Daily  . ferrous sulfate  325 mg Oral Daily  . insulin aspart  0-15 Units Subcutaneous TID WC  . insulin aspart  0-5 Units Subcutaneous QHS  . insulin glargine  15 Units Subcutaneous QHS  . ipratropium  0.5 mg Nebulization QID  . isosorbide mononitrate  30 mg Oral Daily  . lactulose  30 g Oral Daily  . levalbuterol  0.63 mg Nebulization QID  . magnesium gluconate  250 mg Oral Q0600  . pantoprazole  40 mg Oral Daily  . potassium chloride  20 mEq Oral BID  . [START ON 01/24/2016] predniSONE  50 mg Oral Q breakfast  . sodium chloride flush  3 mL Intravenous Q12H  . tamoxifen  20 mg Oral Daily    OBJECTIVE  Filed Vitals:   01/22/16 1956 01/23/16 0457 01/23/16 0732 01/23/16 0900  BP: 124/55 125/60  114/47  Pulse: 85 85  79  Temp: 97.7 F (36.5 C) 97.5 F (36.4 C)  97.8 F (36.6 C)  TempSrc: Oral Oral  Oral  Resp: 19 20  20   Weight:      SpO2: 96% 91% 93% 98%    Intake/Output Summary (Last 24 hours) at 01/23/16 1100 Last data filed at 01/23/16 0900  Gross per 24 hour  Intake   1100 ml  Output    876 ml  Net    224 ml   Filed Weights   01/20/16 2027  Weight: 193 lb 5.5 oz (87.7 kg)    PHYSICAL EXAM  General: Pleasant, female sitting on the bedside commode, no  NAD. Neuro: Alert and oriented X 3. Moves all extremities spontaneously. Psych: Normal affect. HEENT:  Normal  Neck: Supple without bruits or JVD. Lungs:  Resp regular and unlabored, Diffuse wheezing Heart: RRR no s3, s4, or murmurs. Abdomen: Soft, non-tender, non-distended, BS + x 4.  Extremities: No clubbing, cyanosis. Trace  edema. DP/PT/Radials 2+ and equal bilaterally.  Accessory Clinical Findings  CBC  Recent Labs  01/21/16 0540  WBC 13.0*  HGB 8.8*  HCT 27.2*  MCV 98.6  PLT 99*   Basic Metabolic Panel  Recent Labs  01/21/16 1355 01/22/16 0403 01/23/16 0535  NA  --  126* 130*  K  --  4.2 4.2  CL  --  86* 92*  CO2  --  29 29  GLUCOSE  --  143* 135*  BUN  --  53* 48*  CREATININE  --  1.81* 1.52*  CALCIUM  --  9.0 8.8*  MG 2.6*  --   --    Liver Function Tests No results for input(s): AST, ALT, ALKPHOS, BILITOT, PROT, ALBUMIN in the last 72 hours. No results for input(s): LIPASE, AMYLASE in the last 72 hours. Cardiac Enzymes  Recent Labs  01/22/16 0703 01/22/16 1054 01/22/16 1809  TROPONINI 0.45* 0.41* 0.39*   BNP Invalid input(s): POCBNP D-Dimer No results for input(s): DDIMER in the last 72 hours. Hemoglobin A1C No results for input(s): HGBA1C in the last 72 hours. Fasting Lipid Panel No results for input(s): CHOL, HDL, LDLCALC, TRIG, CHOLHDL, LDLDIRECT in the last 72 hours. Thyroid Function Tests No results for input(s): TSH, T4TOTAL, T3FREE, THYROIDAB in the  last 72 hours.  Invalid input(s): FREET3  TELE  Sinus rhythm  Radiology/Studies  Dg Chest Port 1 View  01/22/2016  CLINICAL DATA:  Chest pain. EXAM: PORTABLE CHEST 1 VIEW COMPARISON:  Radiograph of January 21, 2016. FINDINGS: The heart size and mediastinal contours are within normal limits. No pneumothorax is noted. Increased right basilar opacity is noted concerning for worsening pneumonia. Stable mild left basilar opacity is noted concerning for atelectasis or pneumonia. No significant pleural effusion is noted. Status post right shoulder arthroplasty and surgical fusion of cervical spine. IMPRESSION: Increased right basilar opacity is noted concerning for worsening pneumonia. Stable left basilar subsegmental atelectasis or pneumonia. Electronically Signed   By: Marijo Conception, M.D.   On: 01/22/2016 07:17   Dg Chest Port 1 View  01/21/2016  CLINICAL DATA:  Shortness of breath EXAM: PORTABLE CHEST 1 VIEW  COMPARISON:  01/19/2016 chest radiograph. FINDINGS: Surgical hardware overlies the visualized cervical spine and right proximal humerus. Stable cardiomediastinal silhouette with mild cardiomegaly. No pneumothorax. No pleural effusion. Patchy consolidation at both lung bases is new. No pulmonary edema. IMPRESSION: New patchy bibasilar lung consolidation suspicious for aspiration and/ or pneumonia. Electronically Signed   By: Ilona Sorrel M.D.   On: 01/21/2016 11:56   Dg Chest Port 1 View  01/19/2016  CLINICAL DATA:  Shortness of breath and weakness for 2 days, history hypertension, former smoker, COPD, fibromyalgia, cirrhosis EXAM: PORTABLE CHEST 1 VIEW COMPARISON:  Portable exam 0937 hours compared to 09/21/2015 FINDINGS: Normal heart size, mediastinal contours, and pulmonary vascularity. Lungs clear. No pleural effusion or pneumothorax. Prior cervical spine fusion and RIGHT shoulder arthroplasty. IMPRESSION: No acute abnormalities. Electronically Signed   By: Lavonia Dana M.D.   On: 01/19/2016 08:48    ASSESSMENT AND PLAN   1. Arrhythmia - Arrhythmia started after given albuterol in ED.  A. fib versus atrial tachycardia. Maintaining sinus rhythm. As noted by Dr. Percival Spanish, She is a not a candidate for chronic anticoagulation. Continue Bisopolol.    2. Chronic diastolic heart failure - Appears euvolemic. Home dose of lasix is on hold since admission due to electrolyte abnormality (prior hx as well) --> will review with MD when to resume.   3. Chest pain.  - Currently chest pain free. Troponin trend of 0.45-->0.41-->0.39. EKG without acute changes. Continue current medications. could be demand exacerbated by anemia as well as her current respiratory status. No inpatient work up. If ongoing symptoms --> will do as outpatient.   Jarrett Soho PA-C Pager [phone]  Patient seen and examined and history reviewed. Agree with above findings and plan. Feeling much better today. Breathing  improved and no further chest pain. She remains in NSR. Troponin mildly elevated but with flat trend. Ecg shows chronic RBBB without acute change. Troponin elevation most likely related to ARF and anemia.   Cionna Collantes [place], [place] 01/23/2016 11:32 AM

## 2016-01-23 NOTE — Care Management Important Message (Signed)
Important Message  Patient Details  Name: [name] MRN: [id number] Date of Birth: [birth year]   Medicare Important Message Given:  Yes    Loann Quill 01/23/2016, 10:29 AM

## 2016-01-23 NOTE — Progress Notes (Signed)
PROGRESS NOTE                                                                                                                                                                                                             Patient Demographics:    [name], is a 70 y.o. female, DOB - 03/29/1946, WUG:[id number]  Admit date - 01/19/2016   Admitting Physician Albertine Patricia, MD  Outpatient Primary MD for the patient is Alesia Richards, MD  LOS - 4  Outpatient Specialists: none  Chief Complaint  Patient presents with  . Shortness of Breath       Brief Narrative  70 year old female with NASH cirrhosis and varices, CK D, COPD on chronic home O2 (3 L), fibromyalgia, see daily stage III seen at PCP office with progressive weakness and shortness of breath. Blood work done showed significant hyponatremia and hypokalemia and was sent to the ED. Patient was started on azithromycin for acute bronchitis symptoms by her PCP without much help. In the ED she was significantly short of breath with wheezing and developed tachycardia after receiving treatment with albuterol and went into transient A. fib. Patient also became hypotensive in the ED with systolic blood pressure in the 80s. Blood work showed CO2 retention. Sodium of 125 and potassium of 3 along with cytopenia at baseline. Admitted to telemetry for acute on chronic respiratory failure due to COPD exacerbation and cardiology consulted.    Subjective:   Shortness of breath much better. No further chest pain symptoms   Assessment  & Plan :    Principal problem  acute on chronic hypoxic and hypercapnic respiratory failure Secondary to COPD exacerbation with acute bronchitis.Improving. Switch to oral prednisone. Continue scheduled nebs and antitussives. On chronic 2 L via nasal cannula. Discharge home tomorrow if breathing continues to improve.  Right lobar  pneumonia As seen on chest x-ray. Possible for mild aspiration. Empiric Unasyn.   Acute on chronic kidney disease stage III (baseline creatinine 1.3-1.4) Possibly prerenal with hypotension. Held Lasix, Zaroxolyn and metformin admission. Fluid now discontinued and given a dose of IV Lasix. Renal function improving.  Hyponatremia Received IV fluids initially suspicion for hypovolemia. Fluid discontinued and kept on restriction. Given when necessary Lasix with improvement.  Chest pain on 6/17. EKG unremarkable.Troponin peaked at 0.45. Appreciate cardiology evaluation. Possibly due to dyspnea and  acute kidney injury. Has remained stable on telemetry.  A. fib versus MAT Likely transient due to albuterol use. Heart rate better controlled. Appreciate cardiology follow-up.  Hypokalemia Repleted. Normal magnesium.  Prolonged QTC  Avoid Qt longing agents (Celexa and azithromycin discontinued). Now normalized   NASH cirrhosis Holding Aldactone. Continue lactulose at home dose.  Chronic diastolic CHF Hypovolemic on presentation. Now discontinued fluids. Getting when necessary Lasix.  Type 2 diabetes mellitus, uncontrolled Holding metformin. CBG elevated due to steroid use. Added Lantus. Continue moderate sliding scale coverage.  Chronic Thrombcytopenia Secondary to cirrhosis. Stable.  Acute gouty arthritis Cannot use NSAIDs or colchicine given her renal insufficiency. Empiric Solu-Medrol which showed improve her symptoms. Added when necessary Percocet .now resolved.    Leukocytosis Possibly due to steroid use.   Physical deconditioning  PT and OT evaluation recommended home health   Code Status : Full code  Family Communication  : None at bedside  Disposition Plan  : Home in a.m. if continues to improve  Barriers For Discharge : Active symptoms  Consults  :  Cardiology  Procedures  : None  DVT Prophylaxis  :SCDs   Lab Results  Component Value Date   PLT 99*  01/21/2016    Antibiotics  :    Anti-infectives    Start     Dose/Rate Route Frequency Ordered Stop   01/21/16 1345  Ampicillin-Sulbactam (UNASYN) 3 g in sodium chloride 0.9 % 100 mL IVPB  Status:  Discontinued     3 g 100 mL/hr over 60 Minutes Intravenous Every 6 hours 01/21/16 1340 01/21/16 1341   01/21/16 1345  Ampicillin-Sulbactam (UNASYN) 3 g in sodium chloride 0.9 % 100 mL IVPB     3 g 100 mL/hr over 60 Minutes Intravenous Every 12 hours 01/21/16 1341     01/19/16 1430  cefTRIAXone (ROCEPHIN) 1 g in dextrose 5 % 50 mL IVPB  Status:  Discontinued     1 g 100 mL/hr over 30 Minutes Intravenous Every 24 hours 01/19/16 1421 01/21/16 1331        Objective:   Filed Vitals:   01/23/16 0457 01/23/16 0732 01/23/16 0900 01/23/16 1146  BP: 125/60  114/47   Pulse: 85  79   Temp: 97.5 F (36.4 C)  97.8 F (36.6 C)   TempSrc: Oral  Oral   Resp: 20  20   Weight:      SpO2: 91% 93% 98% 96%    Wt Readings from Last 3 Encounters:  01/20/16 87.7 kg (193 lb 5.5 oz)  01/18/16 81.647 kg (180 lb)  12/15/15 80.343 kg (177 lb 2 oz)     Intake/Output Summary (Last 24 hours) at 01/23/16 1155 Last data filed at 01/23/16 0900  Gross per 24 hour  Intake   1100 ml  Output    876 ml  Net    224 ml     Physical Exam  Gen: not in distress, HEENT: moist mucosa, supple neck Chest: Clear breath sounds bilaterally,  CVS: N S1&S2, no murmurs, rubs or gallop GI: soft, NT, ND, BS+ Musculoskeletal: warm, no edema,      Data Review:    CBC  Recent Labs Lab 01/18/16 1134 01/19/16 0820 01/20/16 0838 01/21/16 0540  WBC 5.4 5.5 20.0* 13.0*  HGB 9.8* 9.6* 9.4* 8.8*  HCT 28.5* 28.5* 28.4* 27.2*  PLT 97* 107* 104* 99*  MCV 96.0 96.9 97.3 98.6  MCH 33.0 32.7 32.2 31.9  MCHC 34.4 33.7 33.1 32.4  RDW 15.5* 14.4 14.8  15.3  LYMPHSABS 702* 1.1  --   --   MONOABS 378 0.3  --   --   EOSABS 216 0.3  --   --   BASOSABS 0 0.0  --   --     Chemistries   Recent Labs Lab 01/18/16 1134  01/19/16 0820 01/20/16 0838 01/21/16 0540 01/21/16 1355 01/22/16 0403 01/23/16 0535  NA 120* 125* 125* 123*  --  126* 130*  K 2.7* 3.0* 4.4 4.9  --  4.2 4.2  CL <80* 74* 83* 84*  --  86* 92*  CO2 27 34* 27 25  --  29 29  GLUCOSE 104* 142* 198* 175*  --  143* 135*  BUN 22 20 35* 50*  --  53* 48*  CREATININE 1.24* 1.35* 2.25* 2.15*  --  1.81* 1.52*  CALCIUM 9.2 9.6 8.8* 8.8*  --  9.0 8.8*  MG 1.9 2.1  --   --  2.6*  --   --   AST 79* 110* 92*  --   --   --   --   ALT 23 26 31   --   --   --   --   ALKPHOS 80 77 72  --   --   --   --   BILITOT 1.0 1.1 0.8  --   --   --   --    ------------------------------------------------------------------------------------------------------------------ No results for input(s): CHOL, HDL, LDLCALC, TRIG, CHOLHDL, LDLDIRECT in the last 72 hours.  Lab Results  Component Value Date   HGBA1C 6.2* 01/18/2016   ------------------------------------------------------------------------------------------------------------------ No results for input(s): TSH, T4TOTAL, T3FREE, THYROIDAB in the last 72 hours.  Invalid input(s): FREET3 ------------------------------------------------------------------------------------------------------------------ No results for input(s): VITAMINB12, FOLATE, FERRITIN, TIBC, IRON, RETICCTPCT in the last 72 hours.  Coagulation profile No results for input(s): INR, PROTIME in the last 168 hours.  No results for input(s): DDIMER in the last 72 hours.  Cardiac Enzymes  Recent Labs Lab 01/22/16 0703 01/22/16 1054 01/22/16 1809  TROPONINI 0.45* 0.41* 0.39*   ------------------------------------------------------------------------------------------------------------------    Component Value Date/Time   BNP 72.2 01/19/2016 0820   BNP 43.2 12/17/2014 1204    Inpatient Medications  Scheduled Meds: . allopurinol  100 mg Oral Daily  . ampicillin-sulbactam (UNASYN) IV  3 g Intravenous Q12H  . aspirin  81 mg Oral Daily   . bisoprolol  10 mg Oral Daily  . ferrous sulfate  325 mg Oral Daily  . insulin aspart  0-15 Units Subcutaneous TID WC  . insulin aspart  0-5 Units Subcutaneous QHS  . insulin glargine  15 Units Subcutaneous QHS  . ipratropium  0.5 mg Nebulization QID  . isosorbide mononitrate  30 mg Oral Daily  . lactulose  30 g Oral Daily  . levalbuterol  0.63 mg Nebulization QID  . magnesium gluconate  250 mg Oral Q0600  . pantoprazole  40 mg Oral Daily  . potassium chloride  20 mEq Oral BID  . [START ON 01/24/2016] predniSONE  50 mg Oral Q breakfast  . sodium chloride flush  3 mL Intravenous Q12H  . tamoxifen  20 mg Oral Daily   Continuous Infusions:  PRN Meds:.  Micro Results No results found for this or any previous visit (from the past 240 hour(s)).  Radiology Reports Dg Chest Port 1 View  01/22/2016  CLINICAL DATA:  Chest pain. EXAM: PORTABLE CHEST 1 VIEW COMPARISON:  Radiograph of January 21, 2016. FINDINGS: The heart size and mediastinal contours are within normal limits. No  pneumothorax is noted. Increased right basilar opacity is noted concerning for worsening pneumonia. Stable mild left basilar opacity is noted concerning for atelectasis or pneumonia. No significant pleural effusion is noted. Status post right shoulder arthroplasty and surgical fusion of cervical spine. IMPRESSION: Increased right basilar opacity is noted concerning for worsening pneumonia. Stable left basilar subsegmental atelectasis or pneumonia. Electronically Signed   By: Marijo Conception, M.D.   On: 01/22/2016 07:17   Dg Chest Port 1 View  01/21/2016  CLINICAL DATA:  Shortness of breath EXAM: PORTABLE CHEST 1 VIEW COMPARISON:  01/19/2016 chest radiograph. FINDINGS: Surgical hardware overlies the visualized cervical spine and right proximal humerus. Stable cardiomediastinal silhouette with mild cardiomegaly. No pneumothorax. No pleural effusion. Patchy consolidation at both lung bases is new. No pulmonary edema. IMPRESSION:  New patchy bibasilar lung consolidation suspicious for aspiration and/ or pneumonia. Electronically Signed   By: Ilona Sorrel M.D.   On: 01/21/2016 11:56   Dg Chest Port 1 View  01/19/2016  CLINICAL DATA:  Shortness of breath and weakness for 2 days, history hypertension, former smoker, COPD, fibromyalgia, cirrhosis EXAM: PORTABLE CHEST 1 VIEW COMPARISON:  Portable exam 0937 hours compared to 09/21/2015 FINDINGS: Normal heart size, mediastinal contours, and pulmonary vascularity. Lungs clear. No pleural effusion or pneumothorax. Prior cervical spine fusion and RIGHT shoulder arthroplasty. IMPRESSION: No acute abnormalities. Electronically Signed   By: Lavonia Dana M.D.   On: 01/19/2016 08:48    Time Spent in minutes  25   Louellen Molder M.D on 01/23/2016 at 11:55 AM  Between 7am to 7pm - Pager - [phone]  After 7pm go to [email or web address] - password [email or web address]  [facility] -  Office  [phone]

## 2016-01-23 NOTE — Progress Notes (Signed)
Pharmacy Antibiotic Note  [name] is a 70 y.o. female admitted on 01/19/2016 with COPD exacerbation and acute bronchitis.  Pharmacy has been consulted for Unasyn dosing for aspiration pneumonia coverage.  Afebrile, WBC 13.0 on 6/17. Creatinine peaked at 2.25, now down to 1.52.    Plan:  Adjust Unasyn 3gm IV from q12 to q8hrs, for improved renal status.  Follow up renal function, progress.  Bmet and CBC in am.  Height: 5' 2"  (157.5 cm) Weight: 193 lb 5.5 oz (87.7 kg) IBW/kg (Calculated) : 50.1  Temp (24hrs), Avg:97.9 F (36.6 C), Min:97.5 F (36.4 C), Max:98.4 F (36.9 C)   Recent Labs Lab 01/18/16 1134 01/19/16 0820 01/20/16 0838 01/21/16 0540 01/22/16 0403 01/23/16 0535  WBC 5.4 5.5 20.0* 13.0*  --   --   CREATININE 1.24* 1.35* 2.25* 2.15* 1.81* 1.52*    Estimated Creatinine Clearance: 35.9 mL/min (by C-G formula based on Cr of 1.52).    Allergies  Allergen Reactions  . Atorvastatin Other (See Comments)    Doesn't remember  . Diphenhydramine Hcl (Sleep) Hives  . Hydrocodone-Acetaminophen Other (See Comments)    Doesn't remember  . Lopid [Gemfibrozil] Other (See Comments)    Doesn't remember  . Loratadine Hives  . Lorazepam Hives  . Simvastatin Other (See Comments)  . Sulfamethoxazole Hives  . Sulfonamide Derivatives Hives    Antimicrobials this admission: Unasyn 6/17 >> CTX 6/15>>6/16 (dc'd 6/17 before dose given)  Dose adjustments this admission:  Unasyn changed from q12h to q8hrs on 6/19 with improved renal status  Microbiology results:  none   Arty Baumgartner, [place] Pager: [phone] 01/23/2016 4:28 PM

## 2016-01-24 ENCOUNTER — Inpatient Hospital Stay (HOSPITAL_COMMUNITY): Payer: PPO

## 2016-01-24 DIAGNOSIS — I4581 Long QT syndrome: Secondary | ICD-10-CM | POA: Diagnosis not present

## 2016-01-24 DIAGNOSIS — J441 Chronic obstructive pulmonary disease with (acute) exacerbation: Secondary | ICD-10-CM | POA: Diagnosis not present

## 2016-01-24 DIAGNOSIS — R0789 Other chest pain: Secondary | ICD-10-CM | POA: Diagnosis not present

## 2016-01-24 DIAGNOSIS — E871 Hypo-osmolality and hyponatremia: Secondary | ICD-10-CM | POA: Diagnosis not present

## 2016-01-24 DIAGNOSIS — J9611 Chronic respiratory failure with hypoxia: Secondary | ICD-10-CM | POA: Diagnosis not present

## 2016-01-24 DIAGNOSIS — D696 Thrombocytopenia, unspecified: Secondary | ICD-10-CM | POA: Diagnosis not present

## 2016-01-24 DIAGNOSIS — J9621 Acute and chronic respiratory failure with hypoxia: Secondary | ICD-10-CM | POA: Diagnosis not present

## 2016-01-24 DIAGNOSIS — N179 Acute kidney failure, unspecified: Secondary | ICD-10-CM | POA: Diagnosis not present

## 2016-01-24 DIAGNOSIS — J690 Pneumonitis due to inhalation of food and vomit: Secondary | ICD-10-CM | POA: Diagnosis not present

## 2016-01-24 LAB — CBC
HEMATOCRIT: 28.8 % — AB (ref 36.0–46.0)
HEMOGLOBIN: 9 g/dL — AB (ref 12.0–15.0)
MCH: 32.6 pg (ref 26.0–34.0)
MCHC: 31.3 g/dL (ref 30.0–36.0)
MCV: 104.3 fL — ABNORMAL HIGH (ref 78.0–100.0)
Platelets: 94 10*3/uL — ABNORMAL LOW (ref 150–400)
RBC: 2.76 MIL/uL — AB (ref 3.87–5.11)
RDW: 15.7 % — ABNORMAL HIGH (ref 11.5–15.5)
WBC: 6.1 10*3/uL (ref 4.0–10.5)

## 2016-01-24 LAB — BASIC METABOLIC PANEL
ANION GAP: 8 (ref 5–15)
BUN: 41 mg/dL — ABNORMAL HIGH (ref 6–20)
CALCIUM: 8.9 mg/dL (ref 8.9–10.3)
CO2: 30 mmol/L (ref 22–32)
Chloride: 101 mmol/L (ref 101–111)
Creatinine, Ser: 1.43 mg/dL — ABNORMAL HIGH (ref 0.44–1.00)
GFR, EST AFRICAN AMERICAN: 42 mL/min — AB (ref >60)
GFR, EST NON AFRICAN AMERICAN: 36 mL/min — AB (ref >60)
Glucose, Bld: 90 mg/dL (ref 65–99)
POTASSIUM: 4 mmol/L (ref 3.5–5.1)
SODIUM: 139 mmol/L (ref 135–145)

## 2016-01-24 LAB — GLUCOSE, CAPILLARY
GLUCOSE-CAPILLARY: 97 mg/dL (ref 65–99)
GLUCOSE-CAPILLARY: 114 mg/dL — AB (ref 65–99)

## 2016-01-24 MED ORDER — PREDNISONE 20 MG PO TABS: 20.0000 mg | ORAL_TABLET | Freq: Every day | ORAL | Status: DC

## 2016-01-24 MED ORDER — FUROSEMIDE 80 MG PO TABS: 40.0000 mg | ORAL_TABLET | Freq: Every day | ORAL | Status: DC

## 2016-01-24 MED ORDER — AMOXICILLIN-POT CLAVULANATE 875-125 MG PO TABS
1.0000 | ORAL_TABLET | Freq: Two times a day (BID) | ORAL | Status: AC
Start: 2016-01-24 — End: 2016-01-28

## 2016-01-24 NOTE — Discharge Summary (Signed)
Physician Discharge Summary  [name]:[id number] DOB: Feb 12, 1946 DOA: 01/19/2016  PCP: Alesia Richards, MD  Admit date: 01/19/2016 Discharge date: 01/24/2016  Admitted From: Home Disposition:  Home with home health  Recommendations for Outpatient Follow-up:  1. Follow up with PCP in 1-2 weeks. Completes 7 days of antibiotics on 6/24. Please check renal function during outpatient follow-up and if normalized may resume metformin. 2. Outpatient cardiology follow-up arranged for next week.   Home Health: PT/RN Equipment/Devices: Bedside commode, has home O2 (3 L via nasal cannula)  Discharge Condition: fair CODE STATUS:Full code Diet recommendation:  Carb Modified     Discharge Diagnoses:  Principal Problem:   Acute on chronic respiratory failure with hypoxia (HCC)  Active Problems:     COPD with exacerbation (HCC)   Acute renal failure superimposed on stage 3 chronic kidney disease (HCC)     Aspiration pneumonia (HCC)   Hepatic cirrhosis (HCC)   GERD   Thrombocytopenia (HCC)   CKD stage 3 due to type 2 diabetes mellitus (HCC)   Hypokalemia   DM type 2 causing CKD stage 3 (HCC)   Hyponatremia   Chest pain    atrial tachycardia  Brief narrative 70 year old female with NASH cirrhosis and varices, CK D, COPD on chronic home O2 (3 L), fibromyalgia, see daily stage III seen at PCP office with progressive weakness and shortness of breath. Blood work done showed significant hyponatremia and hypokalemia and was sent to the ED. Patient was started on azithromycin for acute bronchitis symptoms by her PCP without much help. In the ED she was significantly short of breath with wheezing and developed tachycardia after receiving treatment with albuterol and went into transient A. fib. Patient also became hypotensive in the ED with systolic blood pressure in the 80s. Blood work showed CO2 retention. Sodium of 125 and potassium of 3 along with cytopenia at baseline. Admitted  to telemetry for acute on chronic respiratory failure due to COPD exacerbation and cardiology consulted.  Hospital course Principal problem  acute on chronic hypoxic and hypercapnic respiratory failure Secondary to COPD exacerbation . Developed possible aspiration pneumonia while in the hospital. Improved with IV Solu-Medrol which was switched to oral prednisone and DuoNeb's. Continue home O2 (3LNC). I will discharge her on tapering dose of oral prednisone over the next 12 days. Continue home inhaler and nebulizer.  Active problems Right lobar pneumonia As seen on chest x-ray. Possible for mild aspiration. Started on empiric Unasyn. Remains afebrile and dyspnea improved. Underwent MBS and cleared for regular food. Discharge on oral Augmentin to complete a seven-day course of antibiotic.  Acute on chronic kidney disease stage III (baseline creatinine 1.3-1.4) Possibly prerenal with hypotension. Held Lasix, Zaroxolyn and Aldactone and metformin admission.  Renal function improving towards normal. We'll resume Lasix at a lower dose (40 mg daily), hold Zaroxolyn, Aldactone and metformin onto function normal as outpatient.  Hyponatremia Received IV fluids initially suspicion for hypovolemia. Patient then was kept inflated restriction and given when necessary Lasix with improvement.  Chest pain on 6/17. EKG unremarkable.Troponin peaked at 0.45. Appreciate cardiology evaluation. Possibly due to dyspnea and acute kidney injury. Has remained stable on telemetry. Outpatient follow-up.  A. fib versus MAT Likely transient due to albuterol use. Heart rate better controlled. Audiology will arrange outpatient follow-up.  Hypokalemia Repleted. Normal magnesium.  Prolonged QTC Avoid Qt longing agents (Celexa and azithromycin discontinued). Now normalized   NASH cirrhosis Holding Aldactone until renal function normalized.. Continue lactulose at home dose.  Chronic diastolic  CHF Hypovolemic on  presentation. Received IV fluids. Will discharge on low-dose Lasix. Hold Zaroxolyn and Aldactone given worsened renal function. Continue beta blocker.  Type 2 diabetes mellitus, uncontrolled Required Lantus while in the hospital he CBG elevated due to steroid. Holding metformin due to worsening renal function. Patient uncomfortable with being discharged on insulin, cannot use sulfonylureas due to allergy. Instructed to keep a log of her blood glucose. Once she is tapered on prednisone CBG should be better controlled. Metformin can be resumed once function better control.  Chronic Thrombcytopenia Secondary to cirrhosis. Stable.  Acute gouty arthritis Improved with empiric Solu-Medrol and redness at the Percocet.    Leukocytosis Possibly due to steroid use.   Physical deconditioning  PT and OT evaluation recommended home health   Code Status : Full code  Family Communication : Husband at bedside  Disposition Plan : Home with home health    Consults : Cardiology  Procedures :  MBS     Discharge Instructions     Medication List    STOP taking these medications        azithromycin 250 MG tablet  Commonly known as:  ZITHROMAX     metFORMIN 500 MG tablet  Commonly known as:  GLUCOPHAGE     metolazone 5 MG tablet  Commonly known as:  ZAROXOLYN     spironolactone 25 MG tablet  Commonly known as:  ALDACTONE      TAKE these medications        allopurinol 300 MG tablet  Commonly known as:  ZYLOPRIM  1/2-1 tablet daily for gout     amoxicillin-clavulanate 875-125 MG tablet  Commonly known as:  AUGMENTIN  Take 1 tablet by mouth 2 (two) times daily.     aspirin 81 MG tablet  Take 81 mg by mouth daily.     bisoprolol 10 MG tablet  Commonly known as:  ZEBETA  Take 1 tablet (10 mg total) by mouth daily.     citalopram 40 MG tablet  Commonly known as:  CELEXA  Take 40 mg by mouth 2 (two) times daily.     cyclobenzaprine 10 MG tablet  Commonly  known as:  FLEXERIL  Take 1 tablet (10 mg total) by mouth 3 (three) times daily as needed for muscle spasms.     diclofenac sodium 1 % Gel  Commonly known as:  VOLTAREN  Apply 1 application topically 4 (four) times daily as needed. pain     ferrous sulfate 325 (65 FE) MG tablet  TAKE 1 TABLET TWICE DAILY     furosemide 80 MG tablet  Commonly known as:  LASIX  Take 0.5 tablets (40 mg total) by mouth daily.     gabapentin 300 MG capsule  Commonly known as:  NEURONTIN  take 1 capsule by mouth in the morning, 1 capsule in the afternoon and 1-3 capsules at night     ipratropium 0.02 % nebulizer solution  Commonly known as:  ATROVENT  use 1 vial in nebulizer EVERY 4 HOURS AS NEEDED FOR WHEEZING OR SHORTNESS OF BREATH     lactulose 10 GM/15ML solution  Commonly known as:  CHRONULAC  Take 30 ml (1 ounce) 2 x day     Magnesium 250 MG Tabs  Take 250 mg by mouth daily at 6 (six) AM.     ondansetron 4 MG tablet  Commonly known as:  ZOFRAN  Take 1 tablet (4 mg total) by mouth daily as needed for nausea or vomiting.  OXYGEN  Inhale 3 L/min into the lungs daily as needed (On exertion and at home).     pantoprazole 40 MG tablet  Commonly known as:  PROTONIX  Take 1 tablet (40 mg total) by mouth 2 (two) times daily.     potassium chloride 10 MEQ tablet  Commonly known as:  K-DUR  Take 2 tablets (20 mEq total) by mouth daily.     predniSONE 20 MG tablet  Commonly known as:  DELTASONE  Take 1 tablet (20 mg total) by mouth daily with breakfast.     tamoxifen 20 MG tablet  Commonly known as:  NOLVADEX  Take 1 tablet (20 mg total) by mouth daily.     traMADol 50 MG tablet  Commonly known as:  ULTRAM  take 1 TABLET BY MOUTH EVERY 8 HOURS           Follow-up Information    Follow up with Barrett, Suanne Marker, PA-C. Go on 02/02/2016.   Specialties:  Cardiology, Radiology   Why:  @8 :30 am post hospital    Contact information:   [address] [place]  [address] [phone]       Follow up with Unk Pinto DAVID, MD. Schedule an appointment as soon as possible for a visit in 1 week.   Specialty:  Internal Medicine   Contact information:   [address] [place]   [address] [phone]      Allergies  Allergen Reactions  . Atorvastatin Other (See Comments)    Doesn't remember  . Diphenhydramine Hcl (Sleep) Hives  . Hydrocodone-Acetaminophen Other (See Comments)    Doesn't remember  . Lopid [Gemfibrozil] Other (See Comments)    Doesn't remember  . Loratadine Hives  . Lorazepam Hives  . Simvastatin Other (See Comments)  . Sulfamethoxazole Hives  . Sulfonamide Derivatives Hives      Procedures/Studies: Dg Chest Port 1 View  01/22/2016  CLINICAL DATA:  Chest pain. EXAM: PORTABLE CHEST 1 VIEW COMPARISON:  Radiograph of January 21, 2016. FINDINGS: The heart size and mediastinal contours are within normal limits. No pneumothorax is noted. Increased right basilar opacity is noted concerning for worsening pneumonia. Stable mild left basilar opacity is noted concerning for atelectasis or pneumonia. No significant pleural effusion is noted. Status post right shoulder arthroplasty and surgical fusion of cervical spine. IMPRESSION: Increased right basilar opacity is noted concerning for worsening pneumonia. Stable left basilar subsegmental atelectasis or pneumonia. Electronically Signed   By: Marijo Conception, M.D.   On: 01/22/2016 07:17   Dg Chest Port 1 View  01/21/2016  CLINICAL DATA:  Shortness of breath EXAM: PORTABLE CHEST 1 VIEW COMPARISON:  01/19/2016 chest radiograph. FINDINGS: Surgical hardware overlies the visualized cervical spine and right proximal humerus. Stable cardiomediastinal silhouette with mild cardiomegaly. No pneumothorax. No pleural effusion. Patchy consolidation at both lung bases is new. No pulmonary edema. IMPRESSION: New patchy bibasilar lung consolidation suspicious for aspiration and/ or  pneumonia. Electronically Signed   By: Ilona Sorrel M.D.   On: 01/21/2016 11:56   Dg Chest Port 1 View  01/19/2016  CLINICAL DATA:  Shortness of breath and weakness for 2 days, history hypertension, former smoker, COPD, fibromyalgia, cirrhosis EXAM: PORTABLE CHEST 1 VIEW COMPARISON:  Portable exam 0937 hours compared to 09/21/2015 FINDINGS: Normal heart size, mediastinal contours, and pulmonary vascularity. Lungs clear. No pleural effusion or pneumothorax. Prior cervical spine fusion and RIGHT shoulder arthroplasty. IMPRESSION: No acute abnormalities. Electronically Signed   By: Crist Infante.D.  On: 01/19/2016 08:48   Dg Swallowing Func-speech Pathology  01/24/2016  Objective Swallowing Evaluation: Type of Study: MBS-Modified Barium Swallow Study Patient Details Name: [name] MRN: [id number] Date of Birth: April 29, 1946 Today's Date: 01/24/2016 Time: SLP Start Time (ACUTE ONLY): 0952-SLP Stop Time (ACUTE ONLY): 1007 SLP Time Calculation (min) (ACUTE ONLY): 15 min Past Medical History: Past Medical History Diagnosis Date . Hypertension  . Hyperlipidemia  . COPD (chronic obstructive pulmonary disease) ([place])  . Vitamin D deficiency  . IBS (irritable bowel syndrome)  . Fibromyalgia  . Hepatic cirrhosis ([place])  . Esophageal varices ([place])  . Hepatic encephalopathy ([place])  . Gastritis  . Splenomegaly  Past Surgical History: Past Surgical History Procedure Laterality Date . Hip arthroplasty Bilateral  . Shoulder surgery Right  . Back surgery   . Cholecystectomy   . Neck surgery   . Esophagogastroduodenoscopy  07/16/2012   Procedure: ESOPHAGOGASTRODUODENOSCOPY (EGD);  Surgeon: Inda Castle, MD;  Location: [facility];  Service: Endoscopy;  Laterality: N/A; . Gastric varices banding  07/16/2012   Procedure: GASTRIC VARICES BANDING;  Surgeon: Inda Castle, MD;  Location: [facility];  Service: Endoscopy;  Laterality: N/A; . Abdominal hysterectomy   HPI: 70 y.o. female,with a past medical history significant for  NASH cirrhosis with varices, esophageal varices, gastritis, HTN, fibromyalgia, CKD, and COPD on chronic home O2 who presents with shortness of breath and weakness. Diagnosis acute on chronic hypoxic hypercapnic respiratory failure secondary to COPD exacerbation, anterior and posterior ACDF. CXR increased right basilar opacity is noted concerning for worsening pneumonia. Stable left basilar subsegmental atelectasis or pneumonia. BSE 08/08/15 with normal swallow function and difficulty orally transiting pills (longstanding). No Data Recorded Assessment / Plan / Recommendation CHL IP CLINICAL IMPRESSIONS 01/24/2016 Therapy Diagnosis WFL Clinical Impression Overall, oral and swallow function was within functional limits with thin and solid consistencies. Mild oral transit delay/hesitancy suspect for safe timing of swallow initiation. Mild delayed swalllow at the pyriform sinuses with 1st sip thin barium. MBS does not diagnose below the UES, however brief esophageal scan did not reveal overt abnormalities. Educated pt to defer po's during instances of shortness of breath, small sips, stay upright after meals (hx of GERD per pt). Continue regular texture and thin liquids.  Impact on safety and function (No Data)   CHL IP TREATMENT RECOMMENDATION 01/24/2016 Treatment Recommendations No treatment recommended at this time   Prognosis 01/23/2016 Prognosis for Safe Diet Advancement (No Data) Barriers to Reach Goals -- Barriers/Prognosis Comment -- CHL IP DIET RECOMMENDATION 01/24/2016 SLP Diet Recommendations Regular solids;Thin liquid Liquid Administration via Straw;Cup Medication Administration Whole meds with liquid Compensations Slow rate;Small sips/bites Postural Changes Remain semi-upright after after feeds/meals (Comment);Seated upright at 90 degrees   CHL IP OTHER RECOMMENDATIONS 01/24/2016 Recommended Consults -- Oral Care Recommendations Oral care BID Other Recommendations --   CHL IP FOLLOW UP RECOMMENDATIONS 01/24/2016  Follow up Recommendations None   CHL IP FREQUENCY AND DURATION 01/23/2016 Speech Therapy Frequency (ACUTE ONLY) min 2x/week Treatment Duration 2 weeks      CHL IP ORAL PHASE 01/24/2016 Oral Phase WFL Oral - Pudding Teaspoon -- Oral - Pudding Cup -- Oral - Honey Teaspoon -- Oral - Honey Cup -- Oral - Nectar Teaspoon -- Oral - Nectar Cup -- Oral - Nectar Straw -- Oral - Thin Teaspoon -- Oral - Thin Cup -- Oral - Thin Straw -- Oral - Puree -- Oral - Mech Soft -- Oral - Regular -- Oral - Multi-Consistency -- Oral - Pill -- Oral  Phase - Comment --  CHL IP PHARYNGEAL PHASE 01/24/2016 Pharyngeal Phase WFL Pharyngeal- Pudding Teaspoon -- Pharyngeal -- Pharyngeal- Pudding Cup -- Pharyngeal -- Pharyngeal- Honey Teaspoon -- Pharyngeal -- Pharyngeal- Honey Cup -- Pharyngeal -- Pharyngeal- Nectar Teaspoon -- Pharyngeal -- Pharyngeal- Nectar Cup -- Pharyngeal -- Pharyngeal- Nectar Straw -- Pharyngeal -- Pharyngeal- Thin Teaspoon -- Pharyngeal -- Pharyngeal- Thin Cup -- Pharyngeal -- Pharyngeal- Thin Straw -- Pharyngeal -- Pharyngeal- Puree -- Pharyngeal -- Pharyngeal- Mechanical Soft -- Pharyngeal -- Pharyngeal- Regular -- Pharyngeal -- Pharyngeal- Multi-consistency -- Pharyngeal -- Pharyngeal- Pill -- Pharyngeal -- Pharyngeal Comment --  CHL IP CERVICAL ESOPHAGEAL PHASE 01/24/2016 Cervical Esophageal Phase WFL Pudding Teaspoon -- Pudding Cup -- Honey Teaspoon -- Honey Cup -- Nectar Teaspoon -- Nectar Cup -- Nectar Straw -- Thin Teaspoon -- Thin Cup -- Thin Straw -- Puree -- Mechanical Soft -- Regular -- Multi-consistency -- Pill -- Cervical Esophageal Comment -- No flowsheet data found. Houston Siren 01/24/2016, 10:32 AM  Orbie Pyo Colvin Caroli.Ed CCC-SLP Pager [phone]                Subjective:   Discharge Exam: Filed Vitals:   01/24/16 0432 01/24/16 0851  BP: 125/57 135/58  Pulse: 86 82  Temp: 98.2 F (36.8 C) 97.7 F (36.5 C)  Resp: 18 18   Filed Vitals:   01/23/16 1945 01/23/16 2103 01/24/16 0432  01/24/16 0851  BP: 127/69  125/57 135/58  Pulse: 79  86 82  Temp: 98.2 F (36.8 C)  98.2 F (36.8 C) 97.7 F (36.5 C)  TempSrc:    Oral  Resp: 21  18 18   Height:      Weight:  85.4 kg (188 lb 4.4 oz)    SpO2: 100%  98% 97%    Gen: not in distress, HEENT: moist mucosa, supple neck Chest: Clear breath sounds bilaterally,  CVS: N S1&S2, no murmurs, rubs or gallop GI: soft, NT, ND, BS+ Musculoskeletal: warm, no edema    The results of significant diagnostics from this hospitalization (including imaging, microbiology, ancillary and laboratory) are listed below for reference.     Microbiology: No results found for this or any previous visit (from the past 240 hour(s)).   Labs: BNP (last 3 results)  Recent Labs  07/26/15 1300 09/21/15 1650 01/19/16 0820  BNP 58.8 27.1 94.4   Basic Metabolic Panel:  Recent Labs Lab 01/18/16 1134  01/19/16 0820 01/20/16 0838 01/21/16 0540 01/21/16 1355 01/22/16 0403 01/23/16 0535 01/24/16 0431  NA 120*  --  125* 125* 123*  --  126* 130* 139  K 2.7*  --  3.0* 4.4 4.9  --  4.2 4.2 4.0  CL <80*  --  74* 83* 84*  --  86* 92* 101  CO2 27  --  34* 27 25  --  29 29 30   GLUCOSE 104*  --  142* 198* 175*  --  143* 135* 90  BUN 22  --  20 35* 50*  --  53* 48* 41*  CREATININE 1.24*  < > 1.35* 2.25* 2.15*  --  1.81* 1.52* 1.43*  CALCIUM 9.2  --  9.6 8.8* 8.8*  --  9.0 8.8* 8.9  MG 1.9  --  2.1  --   --  2.6*  --   --   --   < > = values in this interval not displayed. Liver Function Tests:  Recent Labs Lab 01/18/16 1134 01/19/16 0820 01/20/16 0838  AST 79* 110* 92*  ALT  23 26 31   ALKPHOS 80 77 72  BILITOT 1.0 1.1 0.8  PROT 5.5* 5.4* 5.1*  ALBUMIN 3.7 3.2* 3.3*   No results for input(s): LIPASE, AMYLASE in the last 168 hours.  Recent Labs Lab 01/19/16 0820  AMMONIA 46*   CBC:  Recent Labs Lab 01/18/16 1134 01/19/16 0820 01/20/16 0838 01/21/16 0540 01/24/16 0431  WBC 5.4 5.5 20.0* 13.0* 6.1  NEUTROABS 4104 3.9  --    --   --   HGB 9.8* 9.6* 9.4* 8.8* 9.0*  HCT 28.5* 28.5* 28.4* 27.2* 28.8*  MCV 96.0 96.9 97.3 98.6 104.3*  PLT 97* 107* 104* 99* 94*   Cardiac Enzymes:  Recent Labs Lab 01/22/16 0703 01/22/16 1054 01/22/16 1809  TROPONINI 0.45* 0.41* 0.39*   BNP: Invalid input(s): POCBNP CBG:  Recent Labs Lab 01/23/16 1137 01/23/16 1730 01/23/16 2101 01/24/16 0740 01/24/16 1139  GLUCAP 174* 133* 180* 97 114*   D-Dimer No results for input(s): DDIMER in the last 72 hours. Hgb A1c No results for input(s): HGBA1C in the last 72 hours. Lipid Profile No results for input(s): CHOL, HDL, LDLCALC, TRIG, CHOLHDL, LDLDIRECT in the last 72 hours. Thyroid function studies No results for input(s): TSH, T4TOTAL, T3FREE, THYROIDAB in the last 72 hours.  Invalid input(s): FREET3 Anemia work up No results for input(s): VITAMINB12, FOLATE, FERRITIN, TIBC, IRON, RETICCTPCT in the last 72 hours. Urinalysis    Component Value Date/Time   COLORURINE YELLOW 01/18/2016 1134   APPEARANCEUR CLEAR 01/18/2016 1134   LABSPEC 1.011 01/18/2016 1134   PHURINE 7.0 01/18/2016 1134   GLUCOSEU NEGATIVE 01/18/2016 1134   HGBUR NEGATIVE 01/18/2016 1134   [place] 01/18/2016 1134   KETONESUR NEGATIVE 01/18/2016 1134   PROTEINUR NEGATIVE 01/18/2016 1134   UROBILINOGEN 1 01/13/2015 1145   NITRITE NEGATIVE 01/18/2016 1134   LEUKOCYTESUR NEGATIVE 01/18/2016 1134   Sepsis Labs Invalid input(s): PROCALCITONIN,  WBC,  LACTICIDVEN Microbiology No results found for this or any previous visit (from the past 240 hour(s)).   Time coordinating discharge: Over 30 minutes  SIGNED:   Louellen Molder, MD  [facility] 01/24/2016, 12:07 PM Pager   If 7PM-7AM, please contact night-coverage [email or web address] Password [email or web address]

## 2016-01-24 NOTE — Progress Notes (Signed)
Discharge instructions  follow up appts and RXs  explained and provided to patient, verbalized understanding. Patient left floor via wheelchair accompanied by volunteers. No c/o pain or shortness of breath at d/c.  Alayziah Tangeman, Tivis Ringer, RN

## 2016-01-24 NOTE — Discharge Instructions (Signed)
Blood Glucose Monitoring, Adult ° °Monitoring your blood glucose (also know as blood sugar) helps you to manage your diabetes. It also helps you and your health care provider monitor your diabetes and determine how well your treatment plan is working. °WHY SHOULD YOU MONITOR YOUR BLOOD GLUCOSE? °· It can help you understand how food, exercise, and medicine affect your blood glucose. °· It allows you to know what your blood glucose is at any given moment. You can quickly tell if you are having low blood glucose (hypoglycemia) or high blood glucose (hyperglycemia). °· It can help you and your health care provider know how to adjust your medicines. °· It can help you understand how to manage an illness or adjust medicine for exercise. °WHEN SHOULD YOU TEST? °Your health care provider will help you decide how often you should check your blood glucose. This may depend on the type of diabetes you have, your diabetes control, or the types of medicines you are taking. Be sure to write down all of your blood glucose readings so that this information can be reviewed with your health care provider. See below for examples of testing times that your health care provider may suggest. °Type 1 Diabetes °· Test at least 2 times per day if your diabetes is well controlled, if you are using an insulin pump, or if you perform multiple daily injections. °· If your diabetes is not well controlled or if you are sick, you may need to test more often. °· It is a good idea to also test: °¨ Before every insulin injection. °¨ Before and after exercise. °¨ Between meals and 2 hours after a meal. °¨ Occasionally between 2:00 a.m. and 3:00 a.m. °Type 2 Diabetes °· If you are taking insulin, test at least 2 times per day. However, it is best to test before every insulin injection. °· If you take medicines by mouth (orally), test 2 times a day. °· If you are on a controlled diet, test once a day. °· If your diabetes is not well controlled or if you  are sick, you may need to monitor more often. °HOW TO MONITOR YOUR BLOOD GLUCOSE °Supplies Needed °· Blood glucose meter. °· Test strips for your meter. Each meter has its own strips. You must use the strips that go with your own meter. °· A pricking needle (lancet). °· A device that holds the lancet (lancing device). °· A journal or log book to write down your results. °Procedure °· Wash your hands with soap and water. Alcohol is not preferred. °· Prick the side of your finger (not the tip) with the lancet. °· Gently milk the finger until a small drop of blood appears. °· Follow the instructions that come with your meter for inserting the test strip, applying blood to the strip, and using your blood glucose meter. °Other Areas to Get Blood for Testing °Some meters allow you to use other areas of your body (other than your finger) to test your blood. These areas are called alternative sites. The most common alternative sites are: °· The forearm. °· The thigh. °· The back area of the lower leg. °· The palm of the hand. °The blood flow in these areas is slower. Therefore, the blood glucose values you get may be delayed, and the numbers are different from what you would get from your fingers. Do not use alternative sites if you think you are having hypoglycemia. Your reading will not be accurate. Always use a finger if you   are having hypoglycemia. Also, if you cannot feel your lows (hypoglycemia unawareness), always use your fingers for your blood glucose checks. °ADDITIONAL TIPS FOR GLUCOSE MONITORING °· Do not reuse lancets. °· Always carry your supplies with you. °· All blood glucose meters have a 24-hour "hotline" number to call if you have questions or need help. °· Adjust (calibrate) your blood glucose meter with a control solution after finishing a few boxes of strips. °BLOOD GLUCOSE RECORD KEEPING °It is a good idea to keep a daily record or log of your blood glucose readings. Most glucose meters, if not all,  keep your glucose records stored in the meter. Some meters come with the ability to download your records to your home computer. Keeping a record of your blood glucose readings is especially helpful if you are wanting to look for patterns. Make notes to go along with the blood glucose readings because you might forget what happened at that exact time. Keeping good records helps you and your health care provider to work together to achieve good diabetes management.  °  °This information is not intended to replace advice given to you by your health care provider. Make sure you discuss any questions you have with your health care provider. °  °Document Released: 07/26/2003 Document Revised: 08/13/2014 Document Reviewed: 12/15/2012 °[employer]vier Interactive Patient Education ©2016 [employer] ° °

## 2016-01-24 NOTE — Care Management Note (Signed)
Case Management Note  Patient Details  Name: [name] MRN: [id number] Date of Birth: January 23, 1946  Subjective/Objective:                 CM following for progression and d/c planning.   Action/Plan: 01/24/2016 Met with pt and husband. Pt very sleepy, pt husband states that pt was recently active with [facility] for HHPT and they wish to continue these services. Pt has walkers, wheelchair, 3:1, therefore for DME needs at this time. Mr Hellmer states that he plans to take the pt home and care for her as he has in the past with assistance of home health serv ices. This CM notified [facility] rep, Mirna Mires of pending orders for [facility] services.   Expected Discharge Date:       01/24/2016           Expected Discharge Plan:  [place]  In-House Referral:  NA  Discharge planning Services  CM Consult  Post Acute Care Choice:  Home Health Choice offered to:  Spouse  DME Arranged:  N/A DME Agency:  NA  HH Arranged:  RN, PT [place] Agency:  [place] ([facility])  Status of Service:  In process, will continue to follow  If discussed at Long Length of Stay Meetings, dates discussed:    Additional Comments:  Adron Bene, RN 01/24/2016, 12:19 PM

## 2016-01-24 NOTE — Progress Notes (Signed)
Physical Therapy Treatment Patient Details Name: [name] MRN: [id number] DOB: 1946/06/20 Today's Date: 01/24/2016    History of Present Illness 70 y.o. female with onset of COPD flare and chronic O2 use 3L at home.  Low BP and current L ankle gouty flare.  PMHx:  encephalopathy, NASH cirrhosis, thrombocytopenia, B hip surgeries,     PT Comments    Making slow progress with functional mobility and activity tolerance; needs continuing cues for focused breathing with activity; walked 12 ft on 4 L O2 with marked DOE; O2 sats decr to 91%;   It may be worth considering a short-term post acute rehab stay to maximize independence and safety with mobility prior to dc home; at her current functional level if she went home, would need a wheelchair  Follow Up Recommendations  Home health PT;SNF;Supervision/Assistance - 24 hour;Other (comment) (Would like to see better progress before going home; if slow progress, we will need to consider SNF)     Equipment Recommendations  Rolling walker with 5" wheels;3in1 (PT)    Recommendations for Other Services Other (comment) (Will consider SW consutl)     Precautions / Restrictions Precautions Precautions: Fall Restrictions Weight Bearing Restrictions: No    Mobility  Bed Mobility Overal bed mobility: Needs Assistance Bed Mobility: Supine to Sit;Sit to Supine     Supine to sit: Supervision Sit to supine: Min assist   General bed mobility comments: Min assist to help LEs into bed   Transfers Overall transfer level: Needs assistance Equipment used: Rolling walker (2 wheeled) Transfers: Sit to/from Stand Sit to Stand: Mod assist         General transfer comment: Mod assist to power up; cues for safety and hand placement  Ambulation/Gait Ambulation/Gait assistance: Min assist;+2 safety/equipment (IV, O2, Dinamap) Ambulation Distance (Feet): 12 Feet Assistive device: Rolling walker (2 wheeled) Gait Pattern/deviations:  Shuffle;Decreased step length - right;Decreased step length - left;Decreased stance time - right;Decreased stance time - left Gait velocity: reduced   General Gait Details: Cues to self-monitor for activity tolerance; tending to stop and stand; audibly incr work of breathing; min assist to keep RW progressing further and encourage continued walking to get to safe place to sit   Stairs            Wheelchair Mobility    Modified Rankin (Stroke Patients Only)       Balance     Sitting balance-Leahy Scale: Fair       Standing balance-Leahy Scale: Poor                      Cognition Arousal/Alertness: Awake/alert (but sleepy, yawning) Behavior During Therapy: WFL for tasks assessed/performed Overall Cognitive Status: Within Functional Limits for tasks assessed                      Exercises      General Comments General comments (skin integrity, edema, etc.): Continued significant DOE with walking around bed; walked on 4 L O2 and O2 sats read 91-94%      Pertinent Vitals/Pain Pain Assessment: No/denies pain Pain Location: Reports is just very tired and sleepy    Home Living                      Prior Function            PT Goals (current goals can now be found in the care plan section) Acute Rehab PT Goals Patient  Stated Goal: not stated PT Goal Formulation: With patient Time For Goal Achievement: 02/03/16 Potential to Achieve Goals: Good Progress towards PT goals: Progressing toward goals (but extremely slowly)    Frequency  Min 3X/week    PT Plan Other (comment);Frequency needs to be updated    Co-evaluation             End of Session Equipment Utilized During Treatment: Oxygen Activity Tolerance: Other (comment) (Limited by DOE) Patient left: in chair;with call bell/phone within reach;Other (comment) (about to transport to [facility])     Time: 6859-9234 PT Time Calculation (min) (ACUTE ONLY): 15 min  Charges:  $Gait  Training: 8-22 mins                    G Codes:      Roney Marion Hamff 01/24/2016, 11:22 AM  Roney Marion, [place] Pager [phone] Office [phone]

## 2016-01-24 NOTE — Progress Notes (Signed)
MBSS complete. Full report located under chart review in imaging section.   Overall, oral and swallow function was within functional limits with thin and solid consistencies. Mild oral transit delay/hesitancy suspect for safe timing of swallow initiation. Mild delayed swalllow at the pyriform sinuses with 1st sip thin barium. MBS does not diagnose below the UES, however brief esophageal scan did not reveal overt abnormalities. Educated pt to defer po's during instances of shortness of breath, small sips, stay upright after meals (hx of GERD per pt). Continue regular texture and thin liquids.    [name] Antonito.Ed [employer] [phone]

## 2016-01-25 DIAGNOSIS — E1122 Type 2 diabetes mellitus with diabetic chronic kidney disease: Secondary | ICD-10-CM | POA: Diagnosis not present

## 2016-01-25 DIAGNOSIS — N183 Chronic kidney disease, stage 3 (moderate): Secondary | ICD-10-CM | POA: Diagnosis not present

## 2016-01-25 DIAGNOSIS — J9621 Acute and chronic respiratory failure with hypoxia: Secondary | ICD-10-CM | POA: Diagnosis not present

## 2016-01-25 DIAGNOSIS — J449 Chronic obstructive pulmonary disease, unspecified: Secondary | ICD-10-CM | POA: Diagnosis not present

## 2016-01-25 DIAGNOSIS — J181 Lobar pneumonia, unspecified organism: Secondary | ICD-10-CM | POA: Diagnosis not present

## 2016-01-25 DIAGNOSIS — I129 Hypertensive chronic kidney disease with stage 1 through stage 4 chronic kidney disease, or unspecified chronic kidney disease: Secondary | ICD-10-CM | POA: Diagnosis not present

## 2016-01-25 DIAGNOSIS — M6281 Muscle weakness (generalized): Secondary | ICD-10-CM | POA: Diagnosis not present

## 2016-01-25 DIAGNOSIS — R261 Paralytic gait: Secondary | ICD-10-CM | POA: Diagnosis not present

## 2016-01-25 DIAGNOSIS — K7581 Nonalcoholic steatohepatitis (NASH): Secondary | ICD-10-CM | POA: Diagnosis not present

## 2016-01-25 DIAGNOSIS — E1165 Type 2 diabetes mellitus with hyperglycemia: Secondary | ICD-10-CM | POA: Diagnosis not present

## 2016-01-31 ENCOUNTER — Ambulatory Visit (INDEPENDENT_AMBULATORY_CARE_PROVIDER_SITE_OTHER): Payer: PPO | Admitting: Internal Medicine

## 2016-01-31 VITALS — BP 136/68 | HR 60 | Temp 97.7°F | Resp 16 | Ht 62.0 in | Wt 186.0 lb

## 2016-01-31 DIAGNOSIS — J42 Unspecified chronic bronchitis: Secondary | ICD-10-CM | POA: Diagnosis not present

## 2016-01-31 DIAGNOSIS — Z79899 Other long term (current) drug therapy: Secondary | ICD-10-CM | POA: Diagnosis not present

## 2016-01-31 DIAGNOSIS — N183 Chronic kidney disease, stage 3 unspecified: Secondary | ICD-10-CM

## 2016-01-31 DIAGNOSIS — E871 Hypo-osmolality and hyponatremia: Secondary | ICD-10-CM | POA: Diagnosis not present

## 2016-01-31 DIAGNOSIS — J9620 Acute and chronic respiratory failure, unspecified whether with hypoxia or hypercapnia: Secondary | ICD-10-CM

## 2016-01-31 DIAGNOSIS — E1122 Type 2 diabetes mellitus with diabetic chronic kidney disease: Secondary | ICD-10-CM

## 2016-01-31 DIAGNOSIS — R188 Other ascites: Secondary | ICD-10-CM

## 2016-01-31 DIAGNOSIS — E876 Hypokalemia: Secondary | ICD-10-CM | POA: Diagnosis not present

## 2016-01-31 DIAGNOSIS — I10 Essential (primary) hypertension: Secondary | ICD-10-CM

## 2016-01-31 DIAGNOSIS — K7460 Unspecified cirrhosis of liver: Secondary | ICD-10-CM | POA: Diagnosis not present

## 2016-02-01 ENCOUNTER — Encounter: Payer: Self-pay | Admitting: Internal Medicine

## 2016-02-01 DIAGNOSIS — N183 Chronic kidney disease, stage 3 (moderate): Secondary | ICD-10-CM | POA: Diagnosis not present

## 2016-02-01 DIAGNOSIS — E1165 Type 2 diabetes mellitus with hyperglycemia: Secondary | ICD-10-CM | POA: Diagnosis not present

## 2016-02-01 DIAGNOSIS — R261 Paralytic gait: Secondary | ICD-10-CM | POA: Diagnosis not present

## 2016-02-01 DIAGNOSIS — J9621 Acute and chronic respiratory failure with hypoxia: Secondary | ICD-10-CM | POA: Diagnosis not present

## 2016-02-01 DIAGNOSIS — J181 Lobar pneumonia, unspecified organism: Secondary | ICD-10-CM | POA: Diagnosis not present

## 2016-02-01 DIAGNOSIS — J449 Chronic obstructive pulmonary disease, unspecified: Secondary | ICD-10-CM | POA: Diagnosis not present

## 2016-02-01 DIAGNOSIS — K7581 Nonalcoholic steatohepatitis (NASH): Secondary | ICD-10-CM | POA: Diagnosis not present

## 2016-02-01 DIAGNOSIS — I129 Hypertensive chronic kidney disease with stage 1 through stage 4 chronic kidney disease, or unspecified chronic kidney disease: Secondary | ICD-10-CM | POA: Diagnosis not present

## 2016-02-01 DIAGNOSIS — M6281 Muscle weakness (generalized): Secondary | ICD-10-CM | POA: Diagnosis not present

## 2016-02-01 DIAGNOSIS — E1122 Type 2 diabetes mellitus with diabetic chronic kidney disease: Secondary | ICD-10-CM | POA: Diagnosis not present

## 2016-02-01 LAB — CBC WITH DIFFERENTIAL/PLATELET
Basophils Absolute: 0 cells/uL (ref 0–200)
Basophils Relative: 0 %
EOS PCT: 1 %
Eosinophils Absolute: 84 cells/uL (ref 15–500)
HCT: 31.5 % — ABNORMAL LOW (ref 35.0–45.0)
HEMOGLOBIN: 10.1 g/dL — AB (ref 11.7–15.5)
LYMPHS ABS: 1176 {cells}/uL (ref 850–3900)
Lymphocytes Relative: 14 %
MCH: 32 pg (ref 27.0–33.0)
MCHC: 32.1 g/dL (ref 32.0–36.0)
MCV: 99.7 fL (ref 80.0–100.0)
MPV: 10.2 fL (ref 7.5–12.5)
Monocytes Absolute: 504 cells/uL (ref 200–950)
Monocytes Relative: 6 %
NEUTROS ABS: 6636 {cells}/uL (ref 1500–7800)
Neutrophils Relative %: 79 %
PLATELETS: 88 10*3/uL — AB (ref 140–400)
RBC: 3.16 MIL/uL — AB (ref 3.80–5.10)
RDW: 15.2 % — ABNORMAL HIGH (ref 11.0–15.0)
WBC: 8.4 10*3/uL (ref 3.8–10.8)

## 2016-02-01 LAB — HEPATIC FUNCTION PANEL
ALT: 101 U/L — AB (ref 6–29)
AST: 49 U/L — AB (ref 10–35)
Albumin: 3.2 g/dL — ABNORMAL LOW (ref 3.6–5.1)
Alkaline Phosphatase: 88 U/L (ref 33–130)
BILIRUBIN DIRECT: 0.2 mg/dL (ref <=0.2)
BILIRUBIN INDIRECT: 0.5 mg/dL (ref 0.2–1.2)
BILIRUBIN TOTAL: 0.7 mg/dL (ref 0.2–1.2)
Total Protein: 5 g/dL — ABNORMAL LOW (ref 6.1–8.1)

## 2016-02-01 LAB — BASIC METABOLIC PANEL WITH GFR
BUN: 12 mg/dL (ref 7–25)
CALCIUM: 8.6 mg/dL (ref 8.6–10.4)
CHLORIDE: 97 mmol/L — AB (ref 98–110)
CO2: 33 mmol/L — AB (ref 20–31)
Creat: 1.06 mg/dL — ABNORMAL HIGH (ref 0.50–0.99)
GFR, EST NON AFRICAN AMERICAN: 54 mL/min — AB (ref >=60)
GFR, Est African American: 62 mL/min (ref >=60)
Glucose, Bld: 78 mg/dL (ref 65–99)
Potassium: 3.3 mmol/L — ABNORMAL LOW (ref 3.5–5.3)
SODIUM: 141 mmol/L (ref 135–146)

## 2016-02-01 LAB — PROTIME-INR
INR: 1
Prothrombin Time: 11 s (ref 9.0–11.5)

## 2016-02-01 LAB — AMMONIA: Ammonia: 52 umol/L (ref 16–53)

## 2016-02-01 NOTE — Progress Notes (Signed)
Patient ID: [name], female   DOB: [birth year], 70 y.o.   MRN: [id number]  [facility] ADULT & ADOLESCENT INTERNAL MEDIC[facility]                       Unk Pinto, M.D.        Uvaldo Bristle. Silverio Lay, P.A.-C       Starlyn Skeans, P.A.-C   [facility]                [address] [place] [address] Telephone [phone] Telefax [phone] ______________________________________________________________________     This very nice, but unfortunate 70 y.o. MWF presents for 1 week post hospital f/u and had been contacted post discharge to schedule f/u .  Patient has multiple  multiple endstage dz's including O2 dependent COPD, NASH w/ Cirrhosis/Ascites/Esophag. Varices and also has  HTN, HLD, PreDM w/Insulin Resistance, GERD and  Vit D deficiency. Patient had been hospitalized 6/15-6/20/2017 for multiple life threatening problems including hyponatremia (Na = 120) and Hypokalemia (K=2.7) and volume contraction with acute on chronic kidney disease as she was hypotensive, systolic BP 80 on presentation and she also had an episode of paroxysmal Afib noted in the ER. She was felt to have had an aspiration pneumonia with complicating acute on her chronic respiratory failure with her continuous O2 supplementation at home. and was treated with parenteral Abx's and d/c'd home on Augmentin. As she was felt dehydrated on admission and she was slowly rehydrated during hospitalization, her diuretics - eg, Lasix, Zaroxolyn & Aldactone were held at discharge. Patient also has marginally compensated cirrhosis/liver failure with hepatic encephalopathy and husband titrates her lactulose to maintain frequent diarrheal BM's averting GI tract ammonia build up.      Patient is treated for labile HTN & BP has been controlled at home. Today's BP: 136/68 mmHg. Patient has had no complaints of any cardiac type chest pain, palpitations, orthopnea/PND, dizziness, claudication,  but doe wax & wmane with variable degrees of dependent edema.  dependent edema.     Hyperlipidemia is controlled with diet & meds. Patient denies myalgias or other med SE's. Last Lipids were 01/18/2016: Cholesterol 163; HDL 51; LDL Cholesterol 76; Triglycerides 181*     Also, the patient has history of T2_NIDDM PreDiabetes and has had no symptoms of reactive hypoglycemia, diabetic polys, paresthesias or visual blurring.  Last A1c was 01/18/2016: Hgb A1c MFr Bld 6.2% on       Further, the patient also has history of Vitamin D Deficiency and supplements vitamin D without any suspected side-effects. Last vitamin D was  56 on6/14/2017.    Medication Sig  . allopurinol  300 MG tablet 1/2-1 tablet daily for gout  . aspirin 81 MG  Take 81 mg by mouth daily.  . bisoprolol  10 MG tablet Take 1 tablet (10 mg total) by mouth daily.  . Citalopram  40 MG tablet Take 40 mg by mouth 2 (two) times daily.  . cyclobenzaprine  10 MG tablet Take 1 tablet (10 mg total) by mouth 3 (three) times daily as needed for muscle spasms.  . diclofenac  1 % GEL Apply 1 application topically 4 (four) times daily as needed. pain  . ferrous sulfate 325 MG tablet TAKE 1 TABLET TWICE DAILY (Patient taking differently: TAKE 1 TABLET ONCE DAILY)  . furosemide  80 MG tablet Take 0.5 tablets (40 mg total)  by mouth daily.  Marland Kitchen gabapentin  300 MG capsule Take 300 mg by mouth 3 (three) times daily. )  . ipratropium  0.02 % neb soln use 1 vial in nebulizer EVERY 4 HOURS AS NEEDED FOR WHEEZING OR SHORTNESS OF BREATH  . lactulose  10 GM/15ML solution Take 30 ml (1 ounce) 2 x day (Patient taking differently: Take 20 g by mouth 2 (two) times daily. )  . Magnesium 250 MG TABS Take 250 mg by mouth daily at 6 (six) AM.  . Ondansetron 4 MG tablet Take 1 tablet (4 mg total) by mouth daily as needed for nausea or vomiting.  . OXYGEN Inhale 3 L/min into the lungs daily as needed (On exertion and at home).   . pantoprazole  40 MG tablet Take 1 tablet  (40 mg total) by mouth 2 (two) times daily.  . potassium chloride0 MEQ tablet Take 2 tablets (20 mEq total) by mouth daily. (Patient taking differently: take 1 tab (64mq) by mouth twice daily)  . predniSONE 20 MG tablet Take 1 tablet (20 mg total) by mouth daily with breakfast.  . tamoxifen 20 MG tablet Take 1 tablet (20 mg total) by mouth daily.  . traMADol 50 MG tablet take 1 TABLET BY MOUTH EVERY 8 HOURS (Patient taking differently: take 1 TABLET BY MOUTH EVERY 8 HOURS AS NEEDED FOR PAIN)   Allergies  Allergen Reactions  . Atorvastatin Other (See Comments)    Doesn't remember  . Diphenhydramine Hcl (Sleep) Hives  . Hydrocodone-Acetaminophen Other (See Comments)    Doesn't remember  . Lopid [Gemfibrozil] Other (See Comments)    Doesn't remember  . Loratadine Hives  . Lorazepam Hives  . Simvastatin Other (See Comments)  . Sulfamethoxazole Hives  . Sulfonamide Derivatives Hives   PMHx:   Past Medical History  Diagnosis Date  . Hypertension   . Hyperlipidemia   . COPD (chronic obstructive pulmonary disease) ([place]   . Vitamin D deficiency   . IBS (irritable bowel syndrome)   . Fibromyalgia   . Hepatic cirrhosis ([place]   . Esophageal varices ([place]   . Hepatic encephalopathy ([place]   . Gastritis   . Splenomegaly    Immunization History  Administered Date(s) Administered  . Hep A / Hep B 11/05/2013, 11/12/2013, 12/07/2013, 12/31/2014  . Hepatitis B, adult 12/19/2015  . Hepatitis B, ped/adol 11/17/2015  . Influenza Split 05/26/2013, 06/17/2014  . Influenza, High Dose Seasonal PF 06/15/2015  . Pneumococcal Conjugate-13 07/22/2015  . Pneumococcal Polysaccharide-23 08/28/2013  . Tdap 11/19/2012   Past Surgical History  Procedure Laterality Date  . Hip arthroplasty Bilateral   . Shoulder surgery Right   . Back surgery    . Cholecystectomy    . Neck surgery    . Esophagogastroduodenoscopy  07/16/2012    Procedure: ESOPHAGOGASTRODUODENOSCOPY (EGD);  Surgeon: RInda Castle  MD;  [facility];  Service: Endoscopy;  Laterality: N/A;  . Gastric varices banding  07/16/2012    Procedure: GASTRIC VARICES BANDING;  Surgeon: RInda Castle MD;  Location: [facility];  Service: Endoscopy;  Laterality: N/A;  . Abdominal hysterectomy     FHx:    Reviewed / unchanged  SHx:    Reviewed / unchanged  Systems Review:  Constitutional: Denies fever, chills, wt changes, headaches, insomnia, fatigue, night sweats, change in appetite. Eyes: Denies redness, blurred vision, diplopia, discharge, itchy, watery eyes.  ENT: Denies discharge, congestion, post nasal drip, epistaxis, sore throat, earache, hearing loss, dental pain, tinnitus, vertigo, sinus pain,  snoring.  CV: Denies chest pain, palpitations, irregular heartbeat, syncope, dyspnea, diaphoresis, orthopnea, PND, claudication or edema. Respiratory: denies cough, dyspnea, DOE, pleurisy, hoarseness, laryngitis, wheezing.  Gastrointestinal: Denies dysphagia, odynophagia, heartburn, reflux, water brash, abdominal pain or cramps, nausea, vomiting, bloating, diarrhea, constipation, hematemesis, melena, hematochezia  or hemorrhoids. Genitourinary: Denies dysuria, frequency, urgency, nocturia, hesitancy, discharge, hematuria or flank pain. Musculoskeletal: Denies arthralgias, myalgias, stiffness, jt. swelling, pain, limping or strain/sprain.  Skin: Denies pruritus, rash, hives, warts, acne, eczema or change in skin lesion(s). Neuro: No weakness, tremor, incoordination, spasms, paresthesia or pain. Psychiatric: Denies confusion, memory loss or sensory loss. Endo: Denies change in weight, skin or hair change.  Heme/Lymph: No excessive bleeding, bruising or enlarged lymph nodes.  Physical Exam  BP 136/68 mmHg  Pulse 60  Temp(Src) 97.7 F (36.5 C)  Resp 16  Ht 5' 2"  (1.575 m)  Wt 186 lb (84.369 kg)  BMI 34.01 kg/m2  O2 sat 96% on 2 L/Min  Appears chronically ill, but in no distress. Oxygen nasal prongs in place.    Eyes: PERRLA, EOMs nl, ,mild chemosis of conjunctiva. Sinuses: No frontal/maxillary tenderness ENT/Mouth: EAC's clear, TM's nl w/o erythema, bulging. Nares clear w/o erythema, swelling, exudates. Oropharynx clear without erythema or exudates.  Tongue normal, non obstructing. Hearing intact.  Neck: Supple. Thyroid nl. Car 1+/1+ without bruits, nodes or JVD. Chest: Respirations distant with BS clear & equal w/o rales, rhonchi, wheezing or stridor.  Cor: Heart sounds very soft w/ regular rate and rhythm without sig. murmurs, gallops, clicks, or rubs. Peripheral pulses normal and equal in UE's and PP obscured by pedal edema.   Abdomen: Soft, rotund and doughy w/o masses , tenderness , guarding or reboung & bowel sounds normal. Non-tender . Lymphatics: Unremarkable.  Musculoskeletal: Full ROM all peripheral extremities, joint stability and normal gait.  Skin: Warm, dry without exposed rashes, lesions, cyanosis, icterus or ecchymosis apparent.  Neuro: Cranial nerves intact, reflexes equal bilaterally. Sensory-motor testing grossly intact. Tendon reflexes flat through out. No Asterixis.  Pysch: Alert & oriented x 3.  Insight and judgement nl & appropriate. No ideations.  Assessment and Plan:  1. Acute on chronic respiratory failure, unspecified whether with hypoxia or hypercapnia (HCC)  - Continue diet, exercise, lifestyle modifications. Monitor appropriate labs  2. Hyponatremia   3. Hypokalemia  - Continue supplementation.  4. Type 2 diabetes mellitus with stage 3 chronic kidney disease, without long-term current use of insulin ([place])   5. Chronic bronchitis, unspecified chronic bronchitis type ([place])   6. Essential hypertension  - Continue monitor blood pressure at home. Continue diet/meds same.  7. Medication management  - CBC with Differential/Platelet - BASIC METABOLIC PANEL WITH GFR - Hepatic function panel  8. Cirrhosis of liver with ascites, unspecified hepatic cirrhosis  type ([place])  - Continue diet/meds, exercise,& lifestyle modifications. Continue monitor periodic cholesterol/liver & renal functions  - Protime-INR - AMMONIA   Recommended regular exercise, BP monitoring, weight control, and discussed med and SE's. Recommended labs to assess and monitor clinical status. Further disposition pending results of labs. Over 30 minutes of exam, counseling, chart review was performed. ROV 2 week intervals for now to hopefully avoid hospitalizations. Poor prognosis was discussed with patient's spouse due to multiple endstage and patient's husband still is not accepting her dismal prognosis and is deferring calling in hospice at this time.

## 2016-02-02 ENCOUNTER — Other Ambulatory Visit: Payer: Self-pay | Admitting: *Deleted

## 2016-02-02 ENCOUNTER — Ambulatory Visit: Payer: PPO | Admitting: Physician Assistant

## 2016-02-02 MED ORDER — GABAPENTIN 300 MG PO CAPS: ORAL_CAPSULE | ORAL | Status: DC

## 2016-02-08 ENCOUNTER — Other Ambulatory Visit: Payer: Self-pay | Admitting: Internal Medicine

## 2016-02-08 ENCOUNTER — Telehealth: Payer: Self-pay | Admitting: *Deleted

## 2016-02-08 NOTE — Telephone Encounter (Signed)
Spouse called and states the patient's weight has been increasing by about 1-2 pounds every other day.  Per Dr Melford Aase, continue the Lasix 40 mg 1/2 daily, but on the days she gains 1-2 pounds, increase the Lasix 40 mg to 1 and 1/2 tablet.

## 2016-02-08 NOTE — Telephone Encounter (Signed)
After speaking with the patient's spouse and learning the patient has gained 9 pounds since 02/02/2016, Dr Melford Aase advised the patient to take Lasix 40 mg 1 and 1/2 tablet daily until her weight goes back to his baseline weight.  The patient has an appointment here 02/13/2016.

## 2016-02-09 ENCOUNTER — Telehealth: Payer: Self-pay | Admitting: *Deleted

## 2016-02-09 DIAGNOSIS — E1165 Type 2 diabetes mellitus with hyperglycemia: Secondary | ICD-10-CM | POA: Diagnosis not present

## 2016-02-09 DIAGNOSIS — M6281 Muscle weakness (generalized): Secondary | ICD-10-CM | POA: Diagnosis not present

## 2016-02-09 DIAGNOSIS — J181 Lobar pneumonia, unspecified organism: Secondary | ICD-10-CM | POA: Diagnosis not present

## 2016-02-09 DIAGNOSIS — R261 Paralytic gait: Secondary | ICD-10-CM | POA: Diagnosis not present

## 2016-02-09 DIAGNOSIS — N183 Chronic kidney disease, stage 3 (moderate): Secondary | ICD-10-CM | POA: Diagnosis not present

## 2016-02-09 DIAGNOSIS — I129 Hypertensive chronic kidney disease with stage 1 through stage 4 chronic kidney disease, or unspecified chronic kidney disease: Secondary | ICD-10-CM | POA: Diagnosis not present

## 2016-02-09 DIAGNOSIS — J449 Chronic obstructive pulmonary disease, unspecified: Secondary | ICD-10-CM | POA: Diagnosis not present

## 2016-02-09 DIAGNOSIS — K7581 Nonalcoholic steatohepatitis (NASH): Secondary | ICD-10-CM | POA: Diagnosis not present

## 2016-02-09 DIAGNOSIS — J9621 Acute and chronic respiratory failure with hypoxia: Secondary | ICD-10-CM | POA: Diagnosis not present

## 2016-02-09 DIAGNOSIS — E1122 Type 2 diabetes mellitus with diabetic chronic kidney disease: Secondary | ICD-10-CM | POA: Diagnosis not present

## 2016-02-09 NOTE — Telephone Encounter (Signed)
Patient's spouse called and reported the patient's weight went down 1 pound after taking Furosemide 60 mg around 2 PM.  Per Dr Melford Aase, give the patient 80 mg of Furosemide after supper today and 80 mg of Furosemide at 8 Am and call with her weight at 11:30 tomorrow.  Spouse is aware .

## 2016-02-09 NOTE — Telephone Encounter (Signed)
[name], with [facility], called and reported the patient's weight is 194# this PM.  The patient took 40 mg Furosemide at 2 PM.  Per Dr Melford Aase, her spouse should monitor her urine output at call the office prior to 5 Pm with the patient's weight following the Furosemide.  He suggest the patient take 1/2 tablet of the Furosemide in the Am's and 1 tablet after lunch, with weight checks at 8 AM and 4 PM.  [name] aware and will advise patient and spouse.

## 2016-02-10 ENCOUNTER — Ambulatory Visit: Payer: PPO | Admitting: Physician Assistant

## 2016-02-10 ENCOUNTER — Telehealth: Payer: Self-pay | Admitting: *Deleted

## 2016-02-10 NOTE — Telephone Encounter (Signed)
Spouse called and reported the patient took Furosemide 80 mg last PM and this morning.  He reported her wight went from 194 yesterday to 191.2 at 11:00 this AM.  Per Dr Melford Aase, continue Furosemide 80 mg every morning until her appointment here on 02/13/2016.  Spouse was informed.

## 2016-02-13 ENCOUNTER — Encounter: Payer: Self-pay | Admitting: Internal Medicine

## 2016-02-13 ENCOUNTER — Ambulatory Visit (INDEPENDENT_AMBULATORY_CARE_PROVIDER_SITE_OTHER): Payer: PPO | Admitting: Internal Medicine

## 2016-02-13 VITALS — BP 118/76 | HR 78 | Temp 98.2°F | Resp 18 | Ht 62.0 in | Wt 197.0 lb

## 2016-02-13 DIAGNOSIS — E871 Hypo-osmolality and hyponatremia: Secondary | ICD-10-CM

## 2016-02-13 DIAGNOSIS — E876 Hypokalemia: Secondary | ICD-10-CM | POA: Diagnosis not present

## 2016-02-13 DIAGNOSIS — R188 Other ascites: Secondary | ICD-10-CM

## 2016-02-13 DIAGNOSIS — K7460 Unspecified cirrhosis of liver: Secondary | ICD-10-CM

## 2016-02-13 LAB — BASIC METABOLIC PANEL WITH GFR
BUN: 10 mg/dL (ref 7–25)
CHLORIDE: 101 mmol/L (ref 98–110)
CO2: 31 mmol/L (ref 20–31)
Calcium: 8 mg/dL — ABNORMAL LOW (ref 8.6–10.4)
Creat: 1.22 mg/dL — ABNORMAL HIGH (ref 0.50–0.99)
GFR, EST AFRICAN AMERICAN: 52 mL/min — AB (ref >=60)
GFR, EST NON AFRICAN AMERICAN: 45 mL/min — AB (ref >=60)
Glucose, Bld: 138 mg/dL — ABNORMAL HIGH (ref 65–99)
POTASSIUM: 3.9 mmol/L (ref 3.5–5.3)
SODIUM: 139 mmol/L (ref 135–146)

## 2016-02-13 NOTE — Progress Notes (Signed)
Subjective:    Patient ID: [name], female    DOB: August 18, 1945, 70 y.o.   MRN: [id number]  HPI  Patient presents to the office for evaluation of non-alcoholic cirrhosis with management of diuretics and hyponatremia.  Patient did have her weight go up to 194 lbs on 02/08/16.  She was told to take 80 mg of lasix daily until seen by me today in the office.  She is up as high as 197.  She has been taking 80 mg of lasix daily.  She has been using lactulose.  She reports that she has been having 2 stools per day.  She reports that she is short of breath with activity.  She is having to sleep in a bed with the [facility] raised.  She feels miserable.  She can't get her shoes on.  She reports that she is not waking up in the night to urinate.    Review of Systems  Constitutional: Negative for fever, chills and fatigue.  Respiratory: Negative for chest tightness and shortness of breath.        Objective:   Physical Exam  Constitutional: She is oriented to person, place, and time. She appears well-developed and well-nourished. No distress. Nasal cannula in place.  HENT:  Head: Normocephalic.  Mouth/Throat: Oropharynx is clear and moist. No oropharyngeal exudate.  Moon facies   Eyes: Conjunctivae are normal. No scleral icterus.  Neck: Normal range of motion. Neck supple. No JVD present. No thyromegaly present.  Cardiovascular: Normal rate, regular rhythm, normal heart sounds and intact distal pulses.  Exam reveals no gallop and no friction rub.   No murmur heard. Pulmonary/Chest: Effort normal and breath sounds normal. No respiratory distress. She has no wheezes. She has no rales. She exhibits no tenderness.  Abdominal: Soft. Bowel sounds are normal. She exhibits ascites. She exhibits no distension, no fluid wave and no mass. There is generalized tenderness. There is no rigidity, no rebound, no guarding, no tenderness at McBurney's point and negative Murphy's sign.  Abdomen tight and distended.   Musculoskeletal: Normal range of motion.  Lymphadenopathy:    She has no cervical adenopathy.  Neurological: She is alert and oriented to person, place, and time.  Skin: Skin is warm and dry. She is not diaphoretic.  Psychiatric: She has a normal mood and affect. Her behavior is normal. Judgment and thought content normal.  Nursing note and vitals reviewed.   Wt Readings from Last 3 Encounters:  02/13/16 197 lb (89.359 kg)  01/31/16 186 lb (84.369 kg)  01/23/16 188 lb 4.4 oz (85.4 kg)   Filed Vitals:   02/13/16 1533  BP: 118/76  Pulse: 78  Temp: 98.2 F (36.8 C)  Resp: 18          Assessment & Plan:  Patient has had large increase in weight which is likely directly related to decrease in diuretics.  Will get stat BMET and start diuresis tomorrow morning with 80 mg of lasix and 5 mg zaroxolyn with 40 mEq daily of potassium along with the use of a salt substitute.  She is to come back on Thursday for BMET recheck and also to recheck weight.  Plan discussed with Dr. Melford Aase who agrees.    1. Hypokalemia  - BASIC METABOLIC PANEL WITH GFR - Ammonia  2. Hyponatremia  - BASIC METABOLIC PANEL WITH GFR - Ammonia  3. Cirrhosis of liver with ascites, unspecified hepatic cirrhosis type (HCC)  - BASIC METABOLIC PANEL WITH GFR - Ammonia

## 2016-02-13 NOTE — Patient Instructions (Signed)
Please take 4 tablets (40 mEq) of Potassium daily.  Please take 80 mg of lasix daily ( 1 tablet in the am).  Please take a full tablet of zaroxolyn (fluid pill booster)   Weight yourself daily.  Please call if you have severe muscle cramping, shortness of breath, chest pain, or any other concerning symptoms.    We will see you back on Thursday.

## 2016-02-14 ENCOUNTER — Other Ambulatory Visit: Payer: Self-pay | Admitting: Internal Medicine

## 2016-02-14 DIAGNOSIS — H02834 Dermatochalasis of left upper eyelid: Secondary | ICD-10-CM | POA: Diagnosis not present

## 2016-02-14 DIAGNOSIS — H25813 Combined forms of age-related cataract, bilateral: Secondary | ICD-10-CM | POA: Diagnosis not present

## 2016-02-14 DIAGNOSIS — E119 Type 2 diabetes mellitus without complications: Secondary | ICD-10-CM | POA: Diagnosis not present

## 2016-02-14 DIAGNOSIS — H02831 Dermatochalasis of right upper eyelid: Secondary | ICD-10-CM | POA: Diagnosis not present

## 2016-02-14 LAB — AMMONIA: AMMONIA: 69 umol/L — AB (ref 16–53)

## 2016-02-16 ENCOUNTER — Encounter: Payer: Self-pay | Admitting: Internal Medicine

## 2016-02-16 ENCOUNTER — Ambulatory Visit (INDEPENDENT_AMBULATORY_CARE_PROVIDER_SITE_OTHER): Payer: PPO | Admitting: Internal Medicine

## 2016-02-16 VITALS — BP 122/60 | HR 96 | Temp 97.9°F | Resp 16 | Ht 62.0 in | Wt 191.8 lb

## 2016-02-16 DIAGNOSIS — K7469 Other cirrhosis of liver: Secondary | ICD-10-CM

## 2016-02-16 DIAGNOSIS — D696 Thrombocytopenia, unspecified: Secondary | ICD-10-CM

## 2016-02-16 MED ORDER — CYCLOBENZAPRINE HCL 10 MG PO TABS: 10.0000 mg | ORAL_TABLET | Freq: Three times a day (TID) | ORAL | Status: DC | PRN

## 2016-02-16 NOTE — Patient Instructions (Signed)
Please take lasix 80 mg in the morning and then 40 mg in the evening.  Please stop the fluid booster.

## 2016-02-16 NOTE — Progress Notes (Signed)
Assessment and Plan:   1. Other cirrhosis of liver ([place]) -stop zaroxolyn -lasix 80 mg in the morning and 40 mg in the evening -cont potassium mEq.  May need to increase this -also considering increasing magnesium secondary to recent potassium deficiency. - CBC with Differential/Platelet - BASIC METABOLIC PANEL WITH GFR - Ammonia  2. Thrombocytopenia ([place]) -recheck CBC     HPI 70 y.o.female presents for 3 day follow up of cirrhosis with fluid overload.  She has gotten 9 lbs of fluid off in the last 2.5 days.  She did not have severe muscle cramps or pain.  She is having some itching of her skin.  She has not other complaints.  Breathing has improved.  She is up to 30 mL of her lactulose due to elevated ammonia levels.  No tremors or asterixis. Patient reports that they have been doing well.  female is taking their medication.  They are not having difficulty with their medications.  They report no adverse reactions.    Past Medical History  Diagnosis Date  . Hypertension   . Hyperlipidemia   . COPD (chronic obstructive pulmonary disease) ([place])   . Vitamin D deficiency   . IBS (irritable bowel syndrome)   . Fibromyalgia   . Hepatic cirrhosis ([place])   . Esophageal varices ([place])   . Hepatic encephalopathy ([place])   . Gastritis   . Splenomegaly      Allergies  Allergen Reactions  . Atorvastatin Other (See Comments)    Doesn't remember  . Diphenhydramine Hcl (Sleep) Hives  . Hydrocodone-Acetaminophen Other (See Comments)    Doesn't remember  . Lopid [Gemfibrozil] Other (See Comments)    Doesn't remember  . Loratadine Hives  . Lorazepam Hives  . Simvastatin Other (See Comments)  . Sulfamethoxazole Hives  . Sulfonamide Derivatives Hives      Current Outpatient Prescriptions on File Prior to Visit  Medication Sig Dispense Refill  . allopurinol (ZYLOPRIM) 300 MG tablet 1/2-1 tablet daily for gout 90 tablet 1  . aspirin 81 MG tablet Take 81 mg by mouth daily.    . bisoprolol  (ZEBETA) 10 MG tablet Take 1 tablet (10 mg total) by mouth daily. 30 tablet 11  . citalopram (CELEXA) 40 MG tablet Take 40 mg by mouth 2 (two) times daily.    . cyclobenzaprine (FLEXERIL) 10 MG tablet Take 1 tablet (10 mg total) by mouth 3 (three) times daily as needed for muscle spasms. 30 tablet 0  . diclofenac sodium (VOLTAREN) 1 % GEL Apply 1 application topically 4 (four) times daily as needed. pain  4  . ferrous sulfate 325 (65 FE) MG tablet TAKE 1 TABLET TWICE DAILY (Patient taking differently: TAKE 1 TABLET ONCE DAILY) 60 tablet 5  . furosemide (LASIX) 80 MG tablet Take 0.5 tablets (40 mg total) by mouth daily. (Patient taking differently: Take 80 mg by mouth daily. ) 30 tablet 0  . gabapentin (NEURONTIN) 300 MG capsule take 1 capsule by mouth in the morning, 1 capsule in the afternoon and 1-3 capsules at night 120 capsule 2  . ipratropium (ATROVENT) 0.02 % nebulizer solution use 1 vial in nebulizer EVERY 4 HOURS AS NEEDED FOR WHEEZING OR SHORTNESS OF BREATH 75 mL 2  . lactulose (CHRONULAC) 10 GM/15ML solution Take 30 ml (1 ounce) 2 x day (Patient taking differently: Take 20 g by mouth 2 (two) times daily. ) 1892 mL 99  . Magnesium 250 MG TABS Take 250 mg by mouth daily at 6 (six) AM.    .  ondansetron (ZOFRAN) 4 MG tablet Take 1 tablet (4 mg total) by mouth daily as needed for nausea or vomiting. 30 tablet 1  . OXYGEN Inhale 3 L/min into the lungs daily as needed (On exertion and at home).     . pantoprazole (PROTONIX) 40 MG tablet Take 1 tablet (40 mg total) by mouth 2 (two) times daily. 180 tablet 1  . potassium chloride (K-DUR) 10 MEQ tablet Take 2 tablets (20 mEq total) by mouth daily. (Patient taking differently: take 1 tab (62mq) by mouth twice daily) 60 tablet 6  . tamoxifen (NOLVADEX) 20 MG tablet Take 1 tablet (20 mg total) by mouth daily. 90 tablet 2  . traMADol (ULTRAM) 50 MG tablet take 1 TABLET BY MOUTH EVERY 8 HOURS (Patient taking differently: take 1 TABLET BY MOUTH EVERY 8  HOURS AS NEEDED FOR PAIN) 90 tablet 1   No current facility-administered medications on file prior to visit.    ROS: all negative except above.   Physical Exam: Filed Weights   02/16/16 1447  Weight: 191 lb 12.8 oz (87 kg)   Wt Readings from Last 3 Encounters:  02/16/16 191 lb 12.8 oz (87 kg)  02/13/16 197 lb (89.359 kg)  01/31/16 186 lb (84.369 kg)    BP 122/60 mmHg  Pulse 96  Temp(Src) 97.9 F (36.6 C) (Temporal)  Resp 16  Ht 5' 2"  (1.575 m)  Wt 191 lb 12.8 oz (87 kg)  BMI 35.07 kg/m2  SpO2 96% General Appearance: Chronically ill appearing on [place] O2, Well developed well nourished, non-toxic appearing in no apparent distress. Eyes: PERRLA, EOMs, conjunctiva w/ no swelling or erythema or discharge Sinuses: No Frontal/maxillary tenderness ENT/Mouth: Ear canals clear without swelling or erythema.  TM's normal bilaterally with no retractions, bulging, or loss of landmarks.   Neck: Supple, thyroid normal, no notable JVD  Respiratory: Respiratory effort normal with [place] O2 in place, Clear breath sounds anteriorly and posteriorly bilaterally without rales, rhonchi, wheezing or stridor. No retractions or accessory muscle usage. Cardio: RRR with no MRGs.  2+ peripheral edema still present.     Abdomen: Soft, + BS.  Non tender, no guarding, rebound, hernias, masses.  Musculoskeletal: Full ROM, 5/5 strength, normal gait.  Skin: Warm, dry without rashes  Neuro: Awake and oriented X 3, Cranial nerves intact. Normal muscle tone, no cerebellar symptoms. Sensation intact.  Psych: normal affect, Insight and Judgment appropriate.     CStarlyn Skeans PA-C 3:25 PM [facility]

## 2016-02-17 DIAGNOSIS — J449 Chronic obstructive pulmonary disease, unspecified: Secondary | ICD-10-CM | POA: Diagnosis not present

## 2016-02-17 LAB — CBC WITH DIFFERENTIAL/PLATELET
BASOS PCT: 0 %
Basophils Absolute: 0 cells/uL (ref 0–200)
EOS ABS: 230 {cells}/uL (ref 15–500)
Eosinophils Relative: 5 %
HCT: 30.7 % — ABNORMAL LOW (ref 35.0–45.0)
Hemoglobin: 9.9 g/dL — ABNORMAL LOW (ref 11.7–15.5)
LYMPHS PCT: 19 %
Lymphs Abs: 874 cells/uL (ref 850–3900)
MCH: 31.9 pg (ref 27.0–33.0)
MCHC: 32.2 g/dL (ref 32.0–36.0)
MCV: 99 fL (ref 80.0–100.0)
MONOS PCT: 8 %
MPV: 11.6 fL (ref 7.5–12.5)
Monocytes Absolute: 368 cells/uL (ref 200–950)
Neutro Abs: 3128 cells/uL (ref 1500–7800)
Neutrophils Relative %: 68 %
PLATELETS: 103 10*3/uL — AB (ref 140–400)
RBC: 3.1 MIL/uL — AB (ref 3.80–5.10)
RDW: 14.9 % (ref 11.0–15.0)
WBC: 4.6 10*3/uL (ref 3.8–10.8)

## 2016-02-17 LAB — BASIC METABOLIC PANEL WITH GFR
BUN: 10 mg/dL (ref 7–25)
CALCIUM: 9 mg/dL (ref 8.6–10.4)
CO2: 36 mmol/L — ABNORMAL HIGH (ref 20–31)
Chloride: 92 mmol/L — ABNORMAL LOW (ref 98–110)
Creat: 1.2 mg/dL — ABNORMAL HIGH (ref 0.50–0.99)
GFR, Est African American: 53 mL/min — ABNORMAL LOW (ref >=60)
GFR, Est Non African American: 46 mL/min — ABNORMAL LOW (ref >=60)
Glucose, Bld: 167 mg/dL — ABNORMAL HIGH (ref 65–99)
POTASSIUM: 2.7 mmol/L — AB (ref 3.5–5.3)
Sodium: 141 mmol/L (ref 135–146)

## 2016-02-17 LAB — AMMONIA: Ammonia: 62 umol/L — ABNORMAL HIGH (ref 16–53)

## 2016-02-20 ENCOUNTER — Other Ambulatory Visit: Payer: PPO

## 2016-02-20 DIAGNOSIS — D649 Anemia, unspecified: Secondary | ICD-10-CM

## 2016-02-20 DIAGNOSIS — K7290 Hepatic failure, unspecified without coma: Secondary | ICD-10-CM

## 2016-02-20 DIAGNOSIS — Z79899 Other long term (current) drug therapy: Secondary | ICD-10-CM | POA: Diagnosis not present

## 2016-02-20 DIAGNOSIS — K7682 Hepatic encephalopathy: Secondary | ICD-10-CM

## 2016-02-20 MED ORDER — POTASSIUM CHLORIDE ER 10 MEQ PO TBCR: EXTENDED_RELEASE_TABLET | ORAL | Status: DC

## 2016-02-21 ENCOUNTER — Other Ambulatory Visit: Payer: Self-pay | Admitting: Internal Medicine

## 2016-02-21 LAB — CBC WITH DIFFERENTIAL/PLATELET
BASOS ABS: 45 {cells}/uL (ref 0–200)
Basophils Relative: 1 %
EOS ABS: 180 {cells}/uL (ref 15–500)
Eosinophils Relative: 4 %
HCT: 30.4 % — ABNORMAL LOW (ref 35.0–45.0)
Hemoglobin: 9.6 g/dL — ABNORMAL LOW (ref 11.7–15.5)
LYMPHS PCT: 20 %
Lymphs Abs: 900 cells/uL (ref 850–3900)
MCH: 31.1 pg (ref 27.0–33.0)
MCHC: 31.6 g/dL — ABNORMAL LOW (ref 32.0–36.0)
MCV: 98.4 fL (ref 80.0–100.0)
MONOS PCT: 8 %
MPV: 10.8 fL (ref 7.5–12.5)
Monocytes Absolute: 360 cells/uL (ref 200–950)
Neutro Abs: 3015 cells/uL (ref 1500–7800)
Neutrophils Relative %: 67 %
PLATELETS: 90 10*3/uL — AB (ref 140–400)
RBC: 3.09 MIL/uL — ABNORMAL LOW (ref 3.80–5.10)
RDW: 15.2 % — AB (ref 11.0–15.0)
WBC: 4.5 10*3/uL (ref 3.8–10.8)

## 2016-02-21 LAB — AMMONIA: Ammonia: 66 umol/L — ABNORMAL HIGH (ref 16–53)

## 2016-02-21 LAB — BASIC METABOLIC PANEL WITH GFR
BUN: 10 mg/dL (ref 7–25)
CO2: 31 mmol/L (ref 20–31)
Calcium: 8.6 mg/dL (ref 8.6–10.4)
Chloride: 95 mmol/L — ABNORMAL LOW (ref 98–110)
Creat: 1.24 mg/dL — ABNORMAL HIGH (ref 0.50–0.99)
GFR, EST AFRICAN AMERICAN: 51 mL/min — AB (ref >=60)
GFR, EST NON AFRICAN AMERICAN: 44 mL/min — AB (ref >=60)
GLUCOSE: 164 mg/dL — AB (ref 65–99)
Potassium: 3.3 mmol/L — ABNORMAL LOW (ref 3.5–5.3)
Sodium: 143 mmol/L (ref 135–146)

## 2016-02-21 LAB — MAGNESIUM: MAGNESIUM: 2 mg/dL (ref 1.5–2.5)

## 2016-02-28 ENCOUNTER — Encounter: Payer: Self-pay | Admitting: Physician Assistant

## 2016-02-28 ENCOUNTER — Ambulatory Visit (INDEPENDENT_AMBULATORY_CARE_PROVIDER_SITE_OTHER): Payer: PPO | Admitting: Physician Assistant

## 2016-02-28 VITALS — BP 122/60 | HR 69 | Temp 99.3°F | Resp 16 | Ht 62.0 in | Wt 189.4 lb

## 2016-02-28 DIAGNOSIS — L820 Inflamed seborrheic keratosis: Secondary | ICD-10-CM | POA: Diagnosis not present

## 2016-02-28 DIAGNOSIS — E1122 Type 2 diabetes mellitus with diabetic chronic kidney disease: Secondary | ICD-10-CM

## 2016-02-28 DIAGNOSIS — R188 Other ascites: Secondary | ICD-10-CM

## 2016-02-28 DIAGNOSIS — K7460 Unspecified cirrhosis of liver: Secondary | ICD-10-CM | POA: Diagnosis not present

## 2016-02-28 DIAGNOSIS — I8510 Secondary esophageal varices without bleeding: Secondary | ICD-10-CM

## 2016-02-28 DIAGNOSIS — D696 Thrombocytopenia, unspecified: Secondary | ICD-10-CM

## 2016-02-28 DIAGNOSIS — J441 Chronic obstructive pulmonary disease with (acute) exacerbation: Secondary | ICD-10-CM

## 2016-02-28 DIAGNOSIS — N183 Chronic kidney disease, stage 3 (moderate): Secondary | ICD-10-CM | POA: Diagnosis not present

## 2016-02-28 MED ORDER — PANTOPRAZOLE SODIUM 40 MG PO TBEC: 40.0000 mg | DELAYED_RELEASE_TABLET | Freq: Every day | ORAL | 1 refills | Status: DC

## 2016-02-28 NOTE — Progress Notes (Signed)
Subjective:    Patient ID: [name], female    DOB: May 25, 1946, 70 y.o.   MRN: [id number]  HPI 70 y.o. WF with complicated history of COPD, DM2 with CKD stage 3, liver cirrhosis with esophageal varices and thrombocytopenia presents for follow up. Her and her husband would like 3 seb keratosis removed that she scratches and causes bleeding.  Her weight is stable, she is on lasix and not on the zaroxyln, her last potassium had improved but was still low normal, she is on 6 potassium a day.  Her weight is stable but her husband, [name] states that he gave her 1/4 of the zaroxyln this weekend due to swelling and her weight went down. She complains of AB GERD, heart burn, and nausea 1 hour after food. She is off protonix.  Wt Readings from Last 3 Encounters:  02/28/16 189 lb 6.4 oz (85.9 kg)  02/16/16 191 lb 12.8 oz (87 kg)  02/13/16 197 lb (89.4 kg)    Lab Results  Component Value Date   CREATININE 1.24 (H) 02/20/2016   BUN 10 02/20/2016   NA 143 02/20/2016   K 3.3 (L) 02/20/2016   CL 95 (L) 02/20/2016   CO2 31 02/20/2016    Blood pressure 122/60, pulse 69, temperature 99.3 F (37.4 C), temperature source Temporal, resp. rate 16, height 5' 2"  (1.575 m), weight 189 lb 6.4 oz (85.9 kg), SpO2 97 %.  Medications Current Outpatient Prescriptions on File Prior to Visit  Medication Sig  . allopurinol (ZYLOPRIM) 300 MG tablet 1/2-1 tablet daily for gout  . aspirin 81 MG tablet Take 81 mg by mouth daily.  . bisoprolol (ZEBETA) 10 MG tablet Take 1 tablet (10 mg total) by mouth daily.  . citalopram (CELEXA) 40 MG tablet Take 40 mg by mouth 2 (two) times daily.  . cyclobenzaprine (FLEXERIL) 10 MG tablet Take 1 tablet (10 mg total) by mouth 3 (three) times daily as needed for muscle spasms.  . diclofenac sodium (VOLTAREN) 1 % GEL Apply 1 application topically 4 (four) times daily as needed. pain  . ferrous sulfate 325 (65 FE) MG tablet TAKE 1 TABLET TWICE DAILY (Patient taking differently:  TAKE 1 TABLET ONCE DAILY)  . furosemide (LASIX) 80 MG tablet Take 0.5 tablets (40 mg total) by mouth daily. (Patient taking differently: Take 80 mg by mouth daily. )  . gabapentin (NEURONTIN) 300 MG capsule take 1 capsule by mouth in the morning, 1 capsule in the afternoon and 1-3 capsules at night  . ipratropium (ATROVENT) 0.02 % nebulizer solution use 1 vial in nebulizer EVERY 4 HOURS AS NEEDED FOR WHEEZING OR SHORTNESS OF BREATH  . lactulose (CHRONULAC) 10 GM/15ML solution Take 30 ml (1 ounce) 2 x day (Patient taking differently: Take 20 g by mouth 2 (two) times daily. )  . Magnesium 250 MG TABS Take 250 mg by mouth daily at 6 (six) AM.  . ondansetron (ZOFRAN) 4 MG tablet Take 1 tablet (4 mg total) by mouth daily as needed for nausea or vomiting.  . OXYGEN Inhale 3 L/min into the lungs daily as needed (On exertion and at home).   . pantoprazole (PROTONIX) 40 MG tablet Take 1 tablet (40 mg total) by mouth 2 (two) times daily.  . potassium chloride (K-DUR) 10 MEQ tablet Take 6 tablets (60 mEq total) by mouth daily.  . tamoxifen (NOLVADEX) 20 MG tablet Take 1 tablet (20 mg total) by mouth daily.  . traMADol (ULTRAM) 50 MG tablet take 1  TABLET BY MOUTH EVERY 8 HOURS (Patient taking differently: take 1 TABLET BY MOUTH EVERY 8 HOURS AS NEEDED FOR PAIN)   No current facility-administered medications on file prior to visit.     Problem list She has COLONIC POLYPS; Anemia; Esophagitis; Diverticulosis of large intestine; Hepatic cirrhosis ([place]); Esophageal varices ([place]); Essential hypertension; Hyperlipidemia; COPD (chronic obstructive pulmonary disease) ([place]); GERD; Vitamin D deficiency; IBS; Fibromyalgia; Medication management; Obesity; At high risk for falls; Encounter for Medicare annual wellness exam; Venous insufficiency; Chronic respiratory failure with hypoxia and hypercapnia ([place]); Thrombocytopenia ([place]); CKD stage 3 due to type 2 diabetes mellitus ([place]); COPD GOLD II with min reversiblity ;  Hepatic encephalopathy ([place]); Hypokalemia; DM type 2 causing CKD stage 3 ([place]); Acute on chronic respiratory failure ([place]); Chronic respiratory failure with hypoxia ([place]); COPD with exacerbation ([place]); Acute renal failure superimposed on stage 3 chronic kidney disease ([place]); Hyponatremia; and Aspiration pneumonia ([place]) on her problem list.   Review of Systems  Constitutional: Negative for chills, fatigue and fever.  Respiratory: Positive for shortness of breath. Negative for cough, chest tightness and wheezing.   Gastrointestinal: Positive for abdominal pain, diarrhea and nausea. Negative for anal bleeding, blood in stool, constipation and vomiting.  Genitourinary: Negative for difficulty urinating, dysuria, hematuria and urgency.       Objective:   Physical Exam  Constitutional: She is oriented to person, place, and time. She appears well-developed and well-nourished. No distress. Nasal cannula in place.  HENT:  Head: Normocephalic.  Mouth/Throat: Oropharynx is clear and moist. No oropharyngeal exudate.  Moon facies  Eyes: Conjunctivae are normal. No scleral icterus.  Neck: Normal range of motion. Neck supple. No JVD present. No thyromegaly present.  Cardiovascular: Normal rate, regular rhythm, normal heart sounds and intact distal pulses.  Exam reveals no gallop and no friction rub.   No murmur heard. Pulmonary/Chest: Effort normal and breath sounds normal. No respiratory distress. She has no wheezes. She has no rales. She exhibits no tenderness.  Abdominal: Soft. Bowel sounds are normal. She exhibits ascites. She exhibits no distension, no fluid wave and no mass. There is generalized tenderness. There is no rigidity, no rebound, no guarding, no tenderness at McBurney's point and negative Murphy's sign.  Abdomen tight and distended.  Musculoskeletal: Normal range of motion.  Patient in a wheelchair  Lymphadenopathy:    She has no cervical adenopathy.  Neurological: She is alert and  oriented to person, place, and time.  Skin: Skin is warm and dry. She is not diaphoretic.  Several dry scaly brown stuck on seb keratosis on right leg, Ab, and right axilla.   Psychiatric: She has a normal mood and affect. Her behavior is normal. Judgment and thought content normal.  Nursing note and vitals reviewed.     Assessment & Plan:  1. COPD with exacerbation (HCC) Monitor breathing  2. Cirrhosis of liver with ascites, unspecified hepatic cirrhosis type (HCC) - Hepatic function panel - Ammonia - check potassium - continue lasix  3. Secondary esophageal varices without bleeding (HCC) Check CBC  4. CKD stage 3 due to type 2 diabetes mellitus ([place])  avoid NSAIDS, monitor sugars, will monitor - BASIC METABOLIC PANEL WITH GFR - Magnesium  5. Thrombocytopenia (HCC) - CBC with Differential/Platelet  6. Seborrheic keratoses, inflamed 3 freeze and thaw technique was used for 6 seb keratosis and 2 actinic keratosis on her head   Future Appointments Date Time Provider [place]  03/01/2016 11:30 AM Starlyn Skeans, PA-C [facility] None  03/05/2016 11:45 AM  Minus Breeding, MD [facility]  04/19/2016 9:45 AM Unk Pinto, MD [facility] None  01/21/2017 10:00 AM Vicie Mutters, PA-C [facility] None

## 2016-02-29 ENCOUNTER — Other Ambulatory Visit: Payer: Self-pay | Admitting: *Deleted

## 2016-02-29 LAB — CBC WITH DIFFERENTIAL/PLATELET
BASOS ABS: 0 {cells}/uL (ref 0–200)
Basophils Relative: 0 %
EOS ABS: 240 {cells}/uL (ref 15–500)
Eosinophils Relative: 4 %
HEMATOCRIT: 30.7 % — AB (ref 35.0–45.0)
Hemoglobin: 9.8 g/dL — ABNORMAL LOW (ref 11.7–15.5)
LYMPHS PCT: 16 %
Lymphs Abs: 960 cells/uL (ref 850–3900)
MCH: 31.7 pg (ref 27.0–33.0)
MCHC: 31.9 g/dL — ABNORMAL LOW (ref 32.0–36.0)
MCV: 99.4 fL (ref 80.0–100.0)
MONO ABS: 420 {cells}/uL (ref 200–950)
MONOS PCT: 7 %
MPV: 10.6 fL (ref 7.5–12.5)
NEUTROS PCT: 73 %
Neutro Abs: 4380 cells/uL (ref 1500–7800)
PLATELETS: 98 10*3/uL — AB (ref 140–400)
RBC: 3.09 MIL/uL — ABNORMAL LOW (ref 3.80–5.10)
RDW: 15.4 % — AB (ref 11.0–15.0)
WBC: 6 10*3/uL (ref 3.8–10.8)

## 2016-02-29 LAB — BASIC METABOLIC PANEL WITH GFR
BUN: 11 mg/dL (ref 7–25)
CHLORIDE: 97 mmol/L — AB (ref 98–110)
CO2: 37 mmol/L — ABNORMAL HIGH (ref 20–31)
CREATININE: 1.25 mg/dL — AB (ref 0.50–0.99)
Calcium: 8.7 mg/dL (ref 8.6–10.4)
GFR, EST AFRICAN AMERICAN: 51 mL/min — AB (ref >=60)
GFR, Est Non African American: 44 mL/min — ABNORMAL LOW (ref >=60)
GLUCOSE: 142 mg/dL — AB (ref 65–99)
POTASSIUM: 2.8 mmol/L — AB (ref 3.5–5.3)
Sodium: 142 mmol/L (ref 135–146)

## 2016-02-29 LAB — HEPATIC FUNCTION PANEL
ALBUMIN: 3.2 g/dL — AB (ref 3.6–5.1)
ALK PHOS: 82 U/L (ref 33–130)
ALT: 10 U/L (ref 6–29)
AST: 23 U/L (ref 10–35)
BILIRUBIN TOTAL: 0.8 mg/dL (ref 0.2–1.2)
Bilirubin, Direct: 0.2 mg/dL (ref <=0.2)
Indirect Bilirubin: 0.6 mg/dL (ref 0.2–1.2)
TOTAL PROTEIN: 4.9 g/dL — AB (ref 6.1–8.1)

## 2016-02-29 LAB — AMMONIA: Ammonia: 57 umol/L — ABNORMAL HIGH (ref 16–53)

## 2016-02-29 LAB — MAGNESIUM: Magnesium: 1.9 mg/dL (ref 1.5–2.5)

## 2016-02-29 MED ORDER — CITALOPRAM HYDROBROMIDE 40 MG PO TABS: 40.0000 mg | ORAL_TABLET | Freq: Two times a day (BID) | ORAL | 3 refills | Status: DC

## 2016-03-01 ENCOUNTER — Ambulatory Visit: Payer: Self-pay | Admitting: Internal Medicine

## 2016-03-02 ENCOUNTER — Other Ambulatory Visit: Payer: PPO

## 2016-03-02 DIAGNOSIS — E876 Hypokalemia: Secondary | ICD-10-CM | POA: Diagnosis not present

## 2016-03-02 LAB — BASIC METABOLIC PANEL WITH GFR
BUN: 9 mg/dL (ref 7–25)
CHLORIDE: 103 mmol/L (ref 98–110)
CO2: 32 mmol/L — AB (ref 20–31)
CREATININE: 1.14 mg/dL — AB (ref 0.50–0.99)
Calcium: 8.5 mg/dL — ABNORMAL LOW (ref 8.6–10.4)
GFR, Est African American: 57 mL/min — ABNORMAL LOW (ref >=60)
GFR, Est Non African American: 49 mL/min — ABNORMAL LOW (ref >=60)
Glucose, Bld: 152 mg/dL — ABNORMAL HIGH (ref 65–99)
Potassium: 4.1 mmol/L (ref 3.5–5.3)
Sodium: 143 mmol/L (ref 135–146)

## 2016-03-02 LAB — MAGNESIUM: MAGNESIUM: 2 mg/dL (ref 1.5–2.5)

## 2016-03-04 NOTE — Progress Notes (Signed)
Cardiology Office Note   Date:  03/04/2016   ID:  [name], DOB 1946-05-01, MRN [id number]  PCP:  Alesia Richards, MD  Cardiologist:   Minus Breeding, MD   No chief complaint on file.     History of Present Illness: [name] is a 70 y.o. female who presents for evaluation of edema and LVH on echo.  I saw her first in Jan of this year.  Since then she has again been in the hospital in June and I have reviewed these records.  She was in the hospital with respiratory failure related to COPD and pneumonia.  She did have a very mild troponin elevation that was thought to be demand ischemia.  She did have transient MAT vs atrial fib.  She returns for follow up of this.   She is chronically ill and frail. She gets around in a wheelchair and has continuous oxygen. Her husband takes excellent care of her once a week routinely. Her weights are typically 186 at home though 190 today. Her primary office following her very closely and has frequent blood work. They've recommended Zaroxolyn when necessary and increased potassium. She had her blood work checked on Friday and I reviewed this and her creatinine and potassium were okay. She says her breathing is at baseline. She sleeps with her head up a little bit in adjustable bed and she's not having any new PND or orthopnea. She has chronic lower extremity swelling. She denies any chest pressure, neck or arm discomfort. She's had no palpitations suggest ablation. She watches her salt restriction fluid.   Past Medical History:  Diagnosis Date  . COPD (chronic obstructive pulmonary disease) ([place])   . Esophageal varices ([place])   . Fibromyalgia   . Gastritis   . Hepatic cirrhosis ([place])   . Hepatic encephalopathy ([place])   . Hyperlipidemia   . Hypertension   . IBS (irritable bowel syndrome)   . Splenomegaly   . Vitamin D deficiency     Past Surgical History:  Procedure Laterality Date  . ABDOMINAL HYSTERECTOMY    . BACK SURGERY     . CHOLECYSTECTOMY    . ESOPHAGOGASTRODUODENOSCOPY  07/16/2012   Procedure: ESOPHAGOGASTRODUODENOSCOPY (EGD);  Surgeon: Inda Castle, MD;  Location: [facility];  Service: Endoscopy;  Laterality: N/A;  . GASTRIC VARICES BANDING  07/16/2012   Procedure: GASTRIC VARICES BANDING;  Surgeon: Inda Castle, MD;  Location: [facility];  Service: Endoscopy;  Laterality: N/A;  . HIP ARTHROPLASTY Bilateral   . NECK SURGERY    . SHOULDER SURGERY Right      Current Outpatient Prescriptions  Medication Sig Dispense Refill  . allopurinol (ZYLOPRIM) 300 MG tablet 1/2-1 tablet daily for gout 90 tablet 1  . aspirin 81 MG tablet Take 81 mg by mouth daily.    . bisoprolol (ZEBETA) 10 MG tablet Take 1 tablet (10 mg total) by mouth daily. 30 tablet 11  . citalopram (CELEXA) 40 MG tablet Take 1 tablet (40 mg total) by mouth 2 (two) times daily. 60 tablet 3  . cyclobenzaprine (FLEXERIL) 10 MG tablet Take 1 tablet (10 mg total) by mouth 3 (three) times daily as needed for muscle spasms. 30 tablet 0  . diclofenac sodium (VOLTAREN) 1 % GEL Apply 1 application topically 4 (four) times daily as needed. pain  4  . ferrous sulfate 325 (65 FE) MG tablet TAKE 1 TABLET TWICE DAILY (Patient taking differently: TAKE 1 TABLET ONCE DAILY) 60 tablet 5  .  furosemide (LASIX) 80 MG tablet Take 0.5 tablets (40 mg total) by mouth daily. (Patient taking differently: Take 80 mg by mouth daily. ) 30 tablet 0  . gabapentin (NEURONTIN) 300 MG capsule take 1 capsule by mouth in the morning, 1 capsule in the afternoon and 1-3 capsules at night 120 capsule 2  . ipratropium (ATROVENT) 0.02 % nebulizer solution use 1 vial in nebulizer EVERY 4 HOURS AS NEEDED FOR WHEEZING OR SHORTNESS OF BREATH 75 mL 2  . lactulose (CHRONULAC) 10 GM/15ML solution Take 30 ml (1 ounce) 2 x day (Patient taking differently: Take 20 g by mouth 2 (two) times daily. ) 1892 mL 99  . Magnesium 250 MG TABS Take 250 mg by mouth daily at 6 (six) AM.    .  ondansetron (ZOFRAN) 4 MG tablet Take 1 tablet (4 mg total) by mouth daily as needed for nausea or vomiting. 30 tablet 1  . OXYGEN Inhale 3 L/min into the lungs daily as needed (On exertion and at home).     . pantoprazole (PROTONIX) 40 MG tablet Take 1 tablet (40 mg total) by mouth daily. 30 tablet 1  . potassium chloride (K-DUR) 10 MEQ tablet Take 6 tablets (60 mEq total) by mouth daily. 180 tablet 1  . tamoxifen (NOLVADEX) 20 MG tablet Take 1 tablet (20 mg total) by mouth daily. 90 tablet 2  . traMADol (ULTRAM) 50 MG tablet take 1 TABLET BY MOUTH EVERY 8 HOURS (Patient taking differently: take 1 TABLET BY MOUTH EVERY 8 HOURS AS NEEDED FOR PAIN) 90 tablet 1   No current facility-administered medications for this visit.     Allergies:   Atorvastatin; Diphenhydramine hcl (sleep); Hydrocodone-acetaminophen; Lopid [gemfibrozil]; Loratadine; Lorazepam; Simvastatin; Sulfamethoxazole; and Sulfonamide derivatives    ROS:  Please see the history of present illness.   Otherwise, review of systems are positive for cataracts.   All other systems are reviewed and negative.    PHYSICAL EXAM: VS:  There were no vitals taken for this visit. , BMI There is no height or weight on file to calculate BMI. GEN:  No distress, chronically ill-appearing  NECK:  No jugular venous distention at 90 degrees, waveform within normal limits, carotid upstroke brisk and symmetric, no bruits, no thyromegaly LYMPHATICS:  No cervical adenopathy LUNGS:  Clear to auscultation bilaterally BACK:  No CVA tenderness CHEST:  Unremarkable HEART:  S1 and S2 within normal limits, no S3, no S4, no clicks, no rubs, no murmurs ABD:  Positive bowel sounds normal in frequency in pitch, no bruits, no rebound, no guarding, unable to assess midline mass or bruit with the patient seated. EXT:  2 plus pulses throughout, moderate left greater than right edema, no cyanosis no clubbing SKIN:  No rashes no nodules NEURO:  Cranial nerves II  through XII grossly intact, motor grossly intact throughout PSYCH:  Cognitively intact, oriented to person place and time   EKG:  EKG is not  ordered today.    Recent Labs: 01/18/2016: TSH 1.44 01/19/2016: B Natriuretic Peptide 72.2 02/28/2016: ALT 10; Hemoglobin 9.8; Platelets 98 03/02/2016: BUN 9; Creat 1.14; Magnesium 2.0; Potassium 4.1; Sodium 143    Lipid Panel    Component Value Date/Time   CHOL 163 01/18/2016 1134   TRIG 181 (H) 01/18/2016 1134   HDL 51 01/18/2016 1134   CHOLHDL 3.2 01/18/2016 1134   VLDL 36 (H) 01/18/2016 1134   LDLCALC 76 01/18/2016 1134      Wt Readings from Last 3 Encounters:  02/28/16 189  lb 6.4 oz (85.9 kg)  02/16/16 191 lb 12.8 oz (87 kg)  02/13/16 197 lb (89.4 kg)      Other studies Reviewed: Additional studies/ records that were reviewed today include: Extensive hospital records from June. Review of the above records demonstrates:  Please see elsewhere in the note.     ASSESSMENT AND PLAN:  ATRIAL ARRHYTHMIA:  This been no symptomatic evidence of this. Regardless she would not be an anticoagulation candidate so further monitoring is not indicated. No change in therapy is planned.  LVH:  The patient does have some diastolic dysfunction which may contribute to some of her volume issues. However, this needs to be treated with conservative measures and we spent a long time talking about salt restriction, fluid restriction, compression stockings and keeping her feet elevated. They're very compliant now with hazy weight. She can keep her feet elevated as much as I would like to cause of discomfort back. She's having close follow-up of her labs by her primary provider.  EDEMA:   As above.   HYPOKALEMIA:  Her potassium was normal on recent check Friday.  Continue current therapy.  COPD:  She saw Dr. Melvyn Novas and she was thought to have severe COPD.    QT PROLONGATION:  This is noted on the EKG.  I reviewed the most recent from June and this was  still present.     The patient has no symptoms related to this.  This could be related to medications.    No further QT prolonging medications should be used.   This can be followed with EKGs.  If she has any symptoms of presyncope/syncope further evaluation would be indicated.    PREOP:  The patient would be at acceptable risk for the planned cataract procedure.    Current medicines are reviewed at length with the patient today.  The patient does not have concerns regarding medicines.  The following changes have been made:  no change  Labs/ tests ordered today include:    No orders of the defined types were placed in this encounter.    Disposition:   FU with me in 6 months.       Signed, Minus Breeding, MD  03/04/2016 11:26 AM    [facility]

## 2016-03-05 ENCOUNTER — Ambulatory Visit (INDEPENDENT_AMBULATORY_CARE_PROVIDER_SITE_OTHER): Payer: PPO | Admitting: Cardiology

## 2016-03-05 ENCOUNTER — Encounter: Payer: Self-pay | Admitting: Physician Assistant

## 2016-03-05 ENCOUNTER — Other Ambulatory Visit: Payer: Self-pay | Admitting: *Deleted

## 2016-03-05 ENCOUNTER — Encounter: Payer: Self-pay | Admitting: Cardiology

## 2016-03-05 VITALS — BP 134/66 | HR 76 | Ht 62.0 in | Wt 190.8 lb

## 2016-03-05 DIAGNOSIS — I5031 Acute diastolic (congestive) heart failure: Secondary | ICD-10-CM | POA: Diagnosis not present

## 2016-03-05 NOTE — Patient Instructions (Signed)
Your physician wants you to follow-up in: 6 Months You will receive a reminder letter in the mail two months in advance. If you don't receive a letter, please call our office to schedule the follow-up appointment.  

## 2016-03-06 ENCOUNTER — Other Ambulatory Visit: Payer: Self-pay | Admitting: Physician Assistant

## 2016-03-06 ENCOUNTER — Other Ambulatory Visit: Payer: Self-pay | Admitting: *Deleted

## 2016-03-06 MED ORDER — FUROSEMIDE 80 MG PO TABS: ORAL_TABLET | ORAL | 6 refills | Status: DC

## 2016-03-06 MED ORDER — CYCLOBENZAPRINE HCL 10 MG PO TABS: 10.0000 mg | ORAL_TABLET | Freq: Three times a day (TID) | ORAL | 2 refills | Status: AC | PRN

## 2016-03-08 DIAGNOSIS — H25811 Combined forms of age-related cataract, right eye: Secondary | ICD-10-CM | POA: Diagnosis not present

## 2016-03-08 DIAGNOSIS — H2511 Age-related nuclear cataract, right eye: Secondary | ICD-10-CM | POA: Diagnosis not present

## 2016-03-08 DIAGNOSIS — H25011 Cortical age-related cataract, right eye: Secondary | ICD-10-CM | POA: Diagnosis not present

## 2016-03-14 DIAGNOSIS — K7581 Nonalcoholic steatohepatitis (NASH): Secondary | ICD-10-CM | POA: Diagnosis not present

## 2016-03-14 DIAGNOSIS — I129 Hypertensive chronic kidney disease with stage 1 through stage 4 chronic kidney disease, or unspecified chronic kidney disease: Secondary | ICD-10-CM | POA: Diagnosis not present

## 2016-03-14 DIAGNOSIS — R261 Paralytic gait: Secondary | ICD-10-CM | POA: Diagnosis not present

## 2016-03-14 DIAGNOSIS — J449 Chronic obstructive pulmonary disease, unspecified: Secondary | ICD-10-CM | POA: Diagnosis not present

## 2016-03-14 DIAGNOSIS — J9621 Acute and chronic respiratory failure with hypoxia: Secondary | ICD-10-CM | POA: Diagnosis not present

## 2016-03-14 DIAGNOSIS — E1122 Type 2 diabetes mellitus with diabetic chronic kidney disease: Secondary | ICD-10-CM | POA: Diagnosis not present

## 2016-03-14 DIAGNOSIS — J181 Lobar pneumonia, unspecified organism: Secondary | ICD-10-CM | POA: Diagnosis not present

## 2016-03-14 DIAGNOSIS — M6281 Muscle weakness (generalized): Secondary | ICD-10-CM | POA: Diagnosis not present

## 2016-03-14 DIAGNOSIS — N183 Chronic kidney disease, stage 3 (moderate): Secondary | ICD-10-CM | POA: Diagnosis not present

## 2016-03-14 DIAGNOSIS — E1165 Type 2 diabetes mellitus with hyperglycemia: Secondary | ICD-10-CM | POA: Diagnosis not present

## 2016-03-19 ENCOUNTER — Other Ambulatory Visit: Payer: Self-pay | Admitting: Internal Medicine

## 2016-03-19 DIAGNOSIS — J449 Chronic obstructive pulmonary disease, unspecified: Secondary | ICD-10-CM | POA: Diagnosis not present

## 2016-03-21 DIAGNOSIS — M7542 Impingement syndrome of left shoulder: Secondary | ICD-10-CM | POA: Diagnosis not present

## 2016-03-22 ENCOUNTER — Telehealth: Payer: Self-pay | Admitting: *Deleted

## 2016-03-22 ENCOUNTER — Other Ambulatory Visit: Payer: Self-pay | Admitting: *Deleted

## 2016-03-22 DIAGNOSIS — J449 Chronic obstructive pulmonary disease, unspecified: Secondary | ICD-10-CM | POA: Diagnosis not present

## 2016-03-22 DIAGNOSIS — J9621 Acute and chronic respiratory failure with hypoxia: Secondary | ICD-10-CM | POA: Diagnosis not present

## 2016-03-22 DIAGNOSIS — J181 Lobar pneumonia, unspecified organism: Secondary | ICD-10-CM | POA: Diagnosis not present

## 2016-03-22 DIAGNOSIS — I129 Hypertensive chronic kidney disease with stage 1 through stage 4 chronic kidney disease, or unspecified chronic kidney disease: Secondary | ICD-10-CM | POA: Diagnosis not present

## 2016-03-22 DIAGNOSIS — E1165 Type 2 diabetes mellitus with hyperglycemia: Secondary | ICD-10-CM | POA: Diagnosis not present

## 2016-03-22 DIAGNOSIS — R261 Paralytic gait: Secondary | ICD-10-CM | POA: Diagnosis not present

## 2016-03-22 DIAGNOSIS — N183 Chronic kidney disease, stage 3 (moderate): Secondary | ICD-10-CM | POA: Diagnosis not present

## 2016-03-22 DIAGNOSIS — K7581 Nonalcoholic steatohepatitis (NASH): Secondary | ICD-10-CM | POA: Diagnosis not present

## 2016-03-22 DIAGNOSIS — M6281 Muscle weakness (generalized): Secondary | ICD-10-CM | POA: Diagnosis not present

## 2016-03-22 DIAGNOSIS — E1122 Type 2 diabetes mellitus with diabetic chronic kidney disease: Secondary | ICD-10-CM | POA: Diagnosis not present

## 2016-03-22 MED ORDER — TRIAMCINOLONE ACETONIDE 0.1 % EX CREA: 1.0000 "application " | TOPICAL_CREAM | Freq: Three times a day (TID) | CUTANEOUS | 0 refills | Status: DC

## 2016-03-22 NOTE — Telephone Encounter (Signed)
Souse called and reported the patient is having shoulder pain and the orthopaedist is suggesting am MRI to evaluate the shoulder. The spouse asked if Dr Melford Aase thought the patient would be able to have surgery.  Per Dr Melford Aase, the spouse was advised he thought she would not be a candidate for elective surgery.  The spouse states he will contact the cardiology and pulmonary office for their advise.

## 2016-03-25 DIAGNOSIS — J449 Chronic obstructive pulmonary disease, unspecified: Secondary | ICD-10-CM | POA: Diagnosis not present

## 2016-03-25 DIAGNOSIS — E1122 Type 2 diabetes mellitus with diabetic chronic kidney disease: Secondary | ICD-10-CM | POA: Diagnosis not present

## 2016-03-25 DIAGNOSIS — Z87891 Personal history of nicotine dependence: Secondary | ICD-10-CM | POA: Diagnosis not present

## 2016-03-25 DIAGNOSIS — M6281 Muscle weakness (generalized): Secondary | ICD-10-CM | POA: Diagnosis not present

## 2016-03-25 DIAGNOSIS — I129 Hypertensive chronic kidney disease with stage 1 through stage 4 chronic kidney disease, or unspecified chronic kidney disease: Secondary | ICD-10-CM | POA: Diagnosis not present

## 2016-03-25 DIAGNOSIS — N183 Chronic kidney disease, stage 3 (moderate): Secondary | ICD-10-CM | POA: Diagnosis not present

## 2016-03-25 DIAGNOSIS — K7581 Nonalcoholic steatohepatitis (NASH): Secondary | ICD-10-CM | POA: Diagnosis not present

## 2016-03-25 DIAGNOSIS — R261 Paralytic gait: Secondary | ICD-10-CM | POA: Diagnosis not present

## 2016-03-25 DIAGNOSIS — Z9981 Dependence on supplemental oxygen: Secondary | ICD-10-CM | POA: Diagnosis not present

## 2016-03-25 DIAGNOSIS — Z7982 Long term (current) use of aspirin: Secondary | ICD-10-CM | POA: Diagnosis not present

## 2016-03-25 DIAGNOSIS — E1165 Type 2 diabetes mellitus with hyperglycemia: Secondary | ICD-10-CM | POA: Diagnosis not present

## 2016-03-25 DIAGNOSIS — J9621 Acute and chronic respiratory failure with hypoxia: Secondary | ICD-10-CM | POA: Diagnosis not present

## 2016-03-25 DIAGNOSIS — Z9181 History of falling: Secondary | ICD-10-CM

## 2016-03-29 DIAGNOSIS — K7581 Nonalcoholic steatohepatitis (NASH): Secondary | ICD-10-CM | POA: Diagnosis not present

## 2016-03-29 DIAGNOSIS — J449 Chronic obstructive pulmonary disease, unspecified: Secondary | ICD-10-CM | POA: Diagnosis not present

## 2016-03-29 DIAGNOSIS — M6281 Muscle weakness (generalized): Secondary | ICD-10-CM | POA: Diagnosis not present

## 2016-03-29 DIAGNOSIS — R261 Paralytic gait: Secondary | ICD-10-CM | POA: Diagnosis not present

## 2016-03-29 DIAGNOSIS — Z7982 Long term (current) use of aspirin: Secondary | ICD-10-CM | POA: Diagnosis not present

## 2016-03-29 DIAGNOSIS — E1165 Type 2 diabetes mellitus with hyperglycemia: Secondary | ICD-10-CM | POA: Diagnosis not present

## 2016-03-29 DIAGNOSIS — N183 Chronic kidney disease, stage 3 (moderate): Secondary | ICD-10-CM | POA: Diagnosis not present

## 2016-03-29 DIAGNOSIS — I129 Hypertensive chronic kidney disease with stage 1 through stage 4 chronic kidney disease, or unspecified chronic kidney disease: Secondary | ICD-10-CM | POA: Diagnosis not present

## 2016-03-29 DIAGNOSIS — E1122 Type 2 diabetes mellitus with diabetic chronic kidney disease: Secondary | ICD-10-CM | POA: Diagnosis not present

## 2016-03-29 DIAGNOSIS — J9621 Acute and chronic respiratory failure with hypoxia: Secondary | ICD-10-CM | POA: Diagnosis not present

## 2016-04-10 ENCOUNTER — Other Ambulatory Visit: Payer: Self-pay | Admitting: Internal Medicine

## 2016-04-10 ENCOUNTER — Telehealth: Payer: Self-pay | Admitting: Internal Medicine

## 2016-04-10 NOTE — Telephone Encounter (Signed)
Spoke with pt's husband. States that pt is having left shoulder pain. [place] wants to do an MRI of her shoulder and possible do surgery if needed. Pt's husband did not want to go through with the MRI if the pt is not eligible for surgery. Advised him that she would need an appointment for surgical clearance. Pt has been scheduled for an appointment with SG on 04/12/16 at 10am. Nothing further was needed at this time.

## 2016-04-12 ENCOUNTER — Encounter: Payer: Self-pay | Admitting: Acute Care

## 2016-04-12 ENCOUNTER — Ambulatory Visit (INDEPENDENT_AMBULATORY_CARE_PROVIDER_SITE_OTHER): Payer: PPO | Admitting: Acute Care

## 2016-04-12 DIAGNOSIS — J9621 Acute and chronic respiratory failure with hypoxia: Secondary | ICD-10-CM | POA: Diagnosis not present

## 2016-04-12 DIAGNOSIS — R261 Paralytic gait: Secondary | ICD-10-CM | POA: Diagnosis not present

## 2016-04-12 DIAGNOSIS — M6281 Muscle weakness (generalized): Secondary | ICD-10-CM | POA: Diagnosis not present

## 2016-04-12 DIAGNOSIS — I129 Hypertensive chronic kidney disease with stage 1 through stage 4 chronic kidney disease, or unspecified chronic kidney disease: Secondary | ICD-10-CM | POA: Diagnosis not present

## 2016-04-12 DIAGNOSIS — J9612 Chronic respiratory failure with hypercapnia: Secondary | ICD-10-CM | POA: Diagnosis not present

## 2016-04-12 DIAGNOSIS — N183 Chronic kidney disease, stage 3 (moderate): Secondary | ICD-10-CM | POA: Diagnosis not present

## 2016-04-12 DIAGNOSIS — K7581 Nonalcoholic steatohepatitis (NASH): Secondary | ICD-10-CM | POA: Diagnosis not present

## 2016-04-12 DIAGNOSIS — J449 Chronic obstructive pulmonary disease, unspecified: Secondary | ICD-10-CM | POA: Diagnosis not present

## 2016-04-12 DIAGNOSIS — J42 Unspecified chronic bronchitis: Secondary | ICD-10-CM

## 2016-04-12 DIAGNOSIS — J9611 Chronic respiratory failure with hypoxia: Secondary | ICD-10-CM

## 2016-04-12 DIAGNOSIS — Z7982 Long term (current) use of aspirin: Secondary | ICD-10-CM | POA: Diagnosis not present

## 2016-04-12 DIAGNOSIS — E1165 Type 2 diabetes mellitus with hyperglycemia: Secondary | ICD-10-CM | POA: Diagnosis not present

## 2016-04-12 DIAGNOSIS — E1122 Type 2 diabetes mellitus with diabetic chronic kidney disease: Secondary | ICD-10-CM | POA: Diagnosis not present

## 2016-04-12 NOTE — Assessment & Plan Note (Addendum)
Continue oxygen at 3L [place] Albuterol rescue inhaler as needed up to every 4-6 hours for shortness of breath of wheezing.

## 2016-04-12 NOTE — Progress Notes (Signed)
Chart and office note reviewed in detail  > agree with a/p as outlined    

## 2016-04-12 NOTE — Progress Notes (Signed)
History of Present Illness [name] is a 70 y.o. female former smoker with COPD Gold Stage II with minimal reversibility, oxygen 3 L 24/7  seen once by Dr. Melvyn Novas 10/2015.  Brief patient profile:  58 yowf with sob x 2014 dx as copd > quit smoking 11/05/14 at wt 160  Did some  better with chronic doe  but still needed 3lpm 24/7 then worse around tgiving 2016 and downhill since then so referred to pulmonary clinic 08/29/2015 by Dr Franne Forts   04/12/2016 Follow Up OV: Pt presents today for  Surgical Clearance for rotator cuff surgery. She has right arm and shoulder pain that she states she can hardly tolerate.She states she was put to sleep 7 years ago for a surgical procedure  prior to being placed on oxygen, and had no problems.. Since that time her functional status and COPD have progressively worsened. She is now requiring 3 L [place] oxygen 24/7, and has very limited functional status. FEV1 is 0.9 .She presents today in a wheel chair and per both she and her husband, she could do more than she does in regard to activity. She is not on maintenance COPD medications, and rarely has to use her rescue inhaler. Per her husband this is because he does not exert herself. She is getting ready to become a great grandmother and would like to be able to hold this new great grandchild.  She did see an orthopedic provider, who told her the next step would be an MRI. She did not want to have the MRI done unless she knew she would be cleared for surgery.  We had a long discussion about the fact that she is at high risk for pulmonary complications.I explained that the MRI was necessary to determine the type of shoulder problem she has and the possible treatment options ( There may be a non-surgical option). I told her we would need to know type and length  of surgery in order to adequately assess her for risk, and that she would need to be evaluated by a surgeon before they would agree to performing a procedure.. I also  explained that she would need to  optimize pulmonary function pre op, if she chooses  to have surgery despite her high risk for pulmonary complications.We discussed her functional status and the risks her limited mobility pose to recovery, and that she needs to work hard on doing everything she can for herself. She verbalized understanding of the above. The plan is as follows.  -MRI and consultation with surgeon for specific treatment/ procedure required/ willingness of surgeon to take her to the  operating room -Evaluation after MRI for non-surgical options for treatment/ pain relief with orthopedics.  -Follow up with Dr. Melvyn Novas for clearance with specifics of procedure, including type and length of procedure if surgeon is  on  board recognizing  her high risk of pulmonary complications.  Tests: PFT's  11/04/2015  FEV1 0.90 (42 % ) ratio 55  p 20 % improvement from saba p no rx prior to study   Past medical hx Past Medical History:  Diagnosis Date  . COPD (chronic obstructive pulmonary disease) ([place])   . Esophageal varices ([place])   . Fibromyalgia   . Gastritis   . Hepatic cirrhosis ([place])   . Hepatic encephalopathy ([place])   . Hyperlipidemia   . Hypertension   . IBS (irritable bowel syndrome)   . Splenomegaly   . Vitamin D deficiency      Past  surgical hx, Family hx, Social hx all reviewed.  Current Outpatient Prescriptions on File Prior to Visit  Medication Sig  . allopurinol (ZYLOPRIM) 300 MG tablet take 1/2-1 tablet by mouth daily for gout  . ALPRAZolam (XANAX) 1 MG tablet Take 1 mg by mouth at bedtime as needed for anxiety.  Marland Kitchen aspirin 81 MG tablet Take 81 mg by mouth daily.  . benzonatate (TESSALON) 100 MG capsule Take by mouth daily as needed for cough.  . bisoprolol (ZEBETA) 10 MG tablet Take 1 tablet (10 mg total) by mouth daily.  . citalopram (CELEXA) 40 MG tablet Take 1 tablet (40 mg total) by mouth 2 (two) times daily.  . cyclobenzaprine (FLEXERIL) 10 MG tablet Take 1 tablet  (10 mg total) by mouth 3 (three) times daily as needed for muscle spasms.  . diclofenac sodium (VOLTAREN) 1 % GEL Apply 1 application topically 4 (four) times daily as needed. pain  . ferrous sulfate 325 (65 FE) MG tablet Take 325 mg by mouth daily with breakfast.  . furosemide (LASIX) 80 MG tablet Take 80 mg in the morning and one-half tablet (40 mg) in the evenings  . gabapentin (NEURONTIN) 300 MG capsule take 1 capsule by mouth in the morning, 1 capsule in the afternoon and 1-3 capsules at night  . ipratropium (ATROVENT) 0.02 % nebulizer solution use 1 vial in nebulizer EVERY 4 HOURS AS NEEDED FOR WHEEZING OR SHORTNESS OF BREATH  . lactulose (CHRONULAC) 10 GM/15ML solution Take 30 ml (1 ounce) 2 x day (Patient taking differently: Take 20 g by mouth 2 (two) times daily. )  . Magnesium 250 MG TABS Take 250 mg by mouth daily at 6 (six) AM.  . ondansetron (ZOFRAN) 4 MG tablet Take 1 tablet (4 mg total) by mouth daily as needed for nausea or vomiting.  . OXYGEN Inhale 3 L/min into the lungs daily as needed (On exertion and at home).   . pantoprazole (PROTONIX) 40 MG tablet Take 1 tablet (40 mg total) by mouth daily.  . potassium chloride (K-DUR) 10 MEQ tablet Take 6 tablets (60 mEq total) by mouth daily.  . tamoxifen (NOLVADEX) 20 MG tablet Take 1 tablet (20 mg total) by mouth daily.  . traMADol (ULTRAM) 50 MG tablet take 1 TABLET BY MOUTH EVERY 8 HOURS (Patient taking differently: take 1 TABLET BY MOUTH EVERY 8 HOURS AS NEEDED FOR PAIN)  . triamcinolone cream (KENALOG) 0.1 % Apply 1 application topically 3 (three) times daily.   No current facility-administered medications on file prior to visit.      Allergies  Allergen Reactions  . Atorvastatin Other (See Comments)    Doesn't remember  . Diphenhydramine Hcl (Sleep) Hives  . Hydrocodone-Acetaminophen Other (See Comments)    Doesn't remember  . Lopid [Gemfibrozil] Other (See Comments)    Doesn't remember  . Loratadine Hives  . Lorazepam  Hives  . Simvastatin Other (See Comments)  . Sulfamethoxazole Hives  . Sulfonamide Derivatives Hives    Review Of Systems:  Constitutional:   No  weight loss, night sweats,  Fevers, chills, fatigue, or  lassitude.  HEENT:   No headaches,  Difficulty swallowing,  Tooth/dental problems, or  Sore throat,                No sneezing, itching, ear ache, nasal congestion, post nasal drip,   CV:  No chest pain,  Orthopnea, PND, swelling in lower extremities, anasarca, dizziness, palpitations, syncope.   GI  No heartburn, indigestion, abdominal pain, nausea,  vomiting, diarrhea, change in bowel habits, loss of appetite, bloody stools.   Resp: + shortness of breath with exertion less at rest.  No excess mucus, no productive cough,  No non-productive cough,  No coughing up of blood.  No change in color of mucus.  +  Wheezing with exertional activity,  No chest wall deformity  Skin: no rash or lesions.  GU: no dysuria, change in color of urine, no urgency or frequency.  No flank pain, no hematuria   MS:  No joint pain or swelling.  No decreased range of motion.  No back pain.  Psych:  No change in mood or affect. No depression or anxiety.  No memory loss.   Vital Signs BP 122/60 (BP Location: Left Arm, Cuff Size: Normal)   Pulse 71   Ht 5' 2.5" (1.588 m)   Wt 182 lb 9.6 oz (82.8 kg)   SpO2 95%   BMI 32.87 kg/m    Physical Exam:  General- No distress,  A&Ox3, obese and severely deconditioned ENT: No sinus tenderness, TM clear, pale nasal mucosa, no oral exudate,no post nasal drip, no LAN Cardiac: S1, S2, regular rate and rhythm, no murmur, trace lower extremity edema. Chest: No wheeze/ rales/ dullness; no accessory muscle use, no nasal flaring, no sternal retractions Abd.: Soft Non-tender Ext: No clubbing cyanosis, trace bilateral lower extremity edema Neuro:  Deconditioned at baseline, MAE x 4, wheelchair bound. Skin: No rashes, warm and dry Psych: normal mood and  behavior   Assessment/Plan  Chronic respiratory failure with hypoxia and hypercapnia (HCC) Continue oxygen at 3L [place] Albuterol rescue inhaler as needed up to every 4-6 hours for shortness of breath of wheezing.  COPD (chronic obstructive pulmonary disease) ([place]) Pt. Presents for surgical clearance for possible surgery on shoulder/rotator cuff Plan: You are at high risk for pulmonary complications with surgery due to your pulmonary status, and deconditioning. Continue to work on increasing your activity at home as able. Schedule an office visit with Dr. Melvyn Novas when you have more information about diagnosis of your shoulder condition and type and length  of surgery. We need this information to adequately assess you for surgery. -MRI and consultation with surgeon for specific treatment/ procedure required/ willingness of surgeon to take her to the  operating room -Evaluation after MRI for non-surgical options for treatment/ pain relief with orthopedics.  Please contact office for sooner follow up if symptoms do not improve or worsen or seek emergency care      Magdalen Spatz, NP 04/12/2016  1:15 PM

## 2016-04-12 NOTE — Patient Instructions (Addendum)
It is good to meet you today. You are at high risk for pulmonary complications with surgery due to your pulmonary status. Continue to work on increasing your activity at home as able. Schedule an office visit with Dr. Melvyn Novas when you have more information about diagnosis of your shoulder condition and type and length  of surgery. Please contact office for sooner follow up if symptoms do not improve or worsen or seek emergency care

## 2016-04-12 NOTE — Assessment & Plan Note (Signed)
Pt. Presents for surgical clearance for possible surgery on shoulder/rotator cuff Plan: You are at high risk for pulmonary complications with surgery due to your pulmonary status, and deconditioning. Continue to work on increasing your activity at home as able. Schedule an office visit with Dr. Melvyn Novas when you have more information about diagnosis of your shoulder condition and type and length  of surgery. We need this information to adequately assess you for surgery. -MRI and consultation with surgeon for specific treatment/ procedure required/ willingness of surgeon to take her to the  operating room -Evaluation after MRI for non-surgical options for treatment/ pain relief with orthopedics.  Please contact office for sooner follow up if symptoms do not improve or worsen or seek emergency care

## 2016-04-19 ENCOUNTER — Ambulatory Visit (INDEPENDENT_AMBULATORY_CARE_PROVIDER_SITE_OTHER): Payer: PPO | Admitting: Internal Medicine

## 2016-04-19 ENCOUNTER — Encounter: Payer: Self-pay | Admitting: Internal Medicine

## 2016-04-19 VITALS — BP 114/58 | HR 72 | Temp 98.1°F | Resp 16 | Ht 62.0 in | Wt 180.0 lb

## 2016-04-19 DIAGNOSIS — E559 Vitamin D deficiency, unspecified: Secondary | ICD-10-CM

## 2016-04-19 DIAGNOSIS — E1122 Type 2 diabetes mellitus with diabetic chronic kidney disease: Secondary | ICD-10-CM

## 2016-04-19 DIAGNOSIS — K7460 Unspecified cirrhosis of liver: Secondary | ICD-10-CM

## 2016-04-19 DIAGNOSIS — Z79899 Other long term (current) drug therapy: Secondary | ICD-10-CM | POA: Diagnosis not present

## 2016-04-19 DIAGNOSIS — I10 Essential (primary) hypertension: Secondary | ICD-10-CM

## 2016-04-19 DIAGNOSIS — I4719 Other supraventricular tachycardia: Secondary | ICD-10-CM

## 2016-04-19 DIAGNOSIS — E785 Hyperlipidemia, unspecified: Secondary | ICD-10-CM | POA: Diagnosis not present

## 2016-04-19 DIAGNOSIS — R188 Other ascites: Secondary | ICD-10-CM

## 2016-04-19 DIAGNOSIS — I471 Supraventricular tachycardia: Secondary | ICD-10-CM

## 2016-04-19 DIAGNOSIS — N183 Chronic kidney disease, stage 3 (moderate): Secondary | ICD-10-CM

## 2016-04-19 DIAGNOSIS — K7290 Hepatic failure, unspecified without coma: Secondary | ICD-10-CM

## 2016-04-19 DIAGNOSIS — M7542 Impingement syndrome of left shoulder: Secondary | ICD-10-CM | POA: Diagnosis not present

## 2016-04-19 DIAGNOSIS — J42 Unspecified chronic bronchitis: Secondary | ICD-10-CM | POA: Diagnosis not present

## 2016-04-19 DIAGNOSIS — M1000 Idiopathic gout, unspecified site: Secondary | ICD-10-CM

## 2016-04-19 DIAGNOSIS — J449 Chronic obstructive pulmonary disease, unspecified: Secondary | ICD-10-CM | POA: Diagnosis not present

## 2016-04-19 DIAGNOSIS — K7682 Hepatic encephalopathy: Secondary | ICD-10-CM

## 2016-04-19 LAB — CBC WITH DIFFERENTIAL/PLATELET
BASOS PCT: 0 %
Basophils Absolute: 0 cells/uL (ref 0–200)
EOS PCT: 5 %
Eosinophils Absolute: 230 cells/uL (ref 15–500)
HCT: 33 % — ABNORMAL LOW (ref 35.0–45.0)
Hemoglobin: 10.7 g/dL — ABNORMAL LOW (ref 11.7–15.5)
Lymphocytes Relative: 17 %
Lymphs Abs: 782 cells/uL (ref 850–3900)
MCH: 30.7 pg (ref 27.0–33.0)
MCHC: 32.4 g/dL (ref 32.0–36.0)
MCV: 94.6 fL (ref 80.0–100.0)
MONOS PCT: 6 %
MPV: 12.5 fL (ref 7.5–12.5)
Monocytes Absolute: 276 cells/uL (ref 200–950)
NEUTROS ABS: 3312 {cells}/uL (ref 1500–7800)
Neutrophils Relative %: 72 %
PLATELETS: 92 10*3/uL — AB (ref 140–400)
RBC: 3.49 MIL/uL — ABNORMAL LOW (ref 3.80–5.10)
RDW: 14.8 % (ref 11.0–15.0)
WBC: 4.6 10*3/uL (ref 3.8–10.8)

## 2016-04-19 LAB — TSH: TSH: 1.89 mIU/L

## 2016-04-19 NOTE — Patient Instructions (Addendum)

## 2016-04-19 NOTE — Progress Notes (Signed)
[facility]                       Unk Pinto, M.D.        Uvaldo Bristle. Silverio Lay, P.A.-C       Starlyn Skeans, P.A.-C  [facility] [address] [place] [address] Telephone [phone] Telefax [phone] ______________________________________________________________________     This very nice 70 y.o.MWF presents for  follow up with multiple late stage to end stage processes including Hypertension, Hyperlipidemia, Pre-Diabetes/Insulin Resistance, GERD, O2 dependent COPD, NASH w/ Cirrhosis/Ascites/Esophag. Varices and Vitamin D Deficiency. Patient has had multiple hospitalizations for electrolyte & fluid imbalances related to her end stage liver disease complicated by severe O2 dependent COPD. Patient also has hx/o hepatic encephalopathy. Last hospitalization was 6/18-20 with Encephalopathy and suspected CAP.     Patient is treated for HTN & BP has been controlled at home. Today's BP: (!) 114/58. Patient has had no complaints of any cardiac type chest pain, palpitations, dyspnea/orthopnea/PND, dizziness, claudication, or dependent edema.     Hyperlipidemia is controlled with diet (as patient is Statin Intolerant).  Last Lipids were at goal: Lab Results  Component Value Date   CHOL 163 01/18/2016   HDL 51 01/18/2016   LDLCALC 76 01/18/2016   TRIG 181 (H) 01/18/2016   CHOLHDL 3.2 01/18/2016      Also, the patient has history of PreDiabetes and has had no symptoms of reactive hypoglycemia, diabetic polys, paresthesias or visual blurring.  Last A1c was  Lab Results  Component Value Date   HGBA1C 6.2 (H) 01/18/2016      Further, the patient also has history of Vitamin D Deficiency and supplements vitamin D without any suspected side-effects. Last vitamin D was   Lab Results  Component Value Date   VD25OH 62 01/18/2016   Current Outpatient Prescriptions on File Prior to Visit   Medication Sig  . allopurinol (ZYLOPRIM) 300 MG tablet take 1/2-1 tablet by mouth daily for gout  . ALPRAZolam (XANAX) 1 MG tablet Take 1 mg by mouth at bedtime as needed for anxiety.  Marland Kitchen aspirin 81 MG tablet Take 81 mg by mouth daily.  . benzonatate (TESSALON) 100 MG capsule Take by mouth daily as needed for cough.  . bisoprolol (ZEBETA) 10 MG tablet Take 1 tablet (10 mg total) by mouth daily.  . citalopram (CELEXA) 40 MG tablet Take 1 tablet (40 mg total) by mouth 2 (two) times daily.  . cyclobenzaprine (FLEXERIL) 10 MG tablet Take 1 tablet (10 mg total) by mouth 3 (three) times daily as needed for muscle spasms.  . diclofenac sodium (VOLTAREN) 1 % GEL Apply 1 application topically 4 (four) times daily as needed. pain  . ferrous sulfate 325 (65 FE) MG tablet Take 325 mg by mouth daily with breakfast.  . furosemide (LASIX) 80 MG tablet Take 80 mg in the morning and one-half tablet (40 mg) in the evenings  . gabapentin (NEURONTIN) 300 MG capsule take 1 capsule by mouth in the morning, 1 capsule in the afternoon and 1-3 capsules at night  . ipratropium (ATROVENT) 0.02 % nebulizer solution use 1 vial in nebulizer EVERY 4 HOURS AS NEEDED FOR WHEEZING OR SHORTNESS OF BREATH  . lactulose (CHRONULAC) 10 GM/15ML solution Take 30 ml (1 ounce)  2 x day (Patient taking differently: Take 20 g by mouth 2 (two) times daily. )  . Magnesium 250 MG TABS Take 250 mg by mouth daily at 6 (six) AM.  . ondansetron (ZOFRAN) 4 MG tablet Take 1 tablet (4 mg total) by mouth daily as needed for nausea or vomiting.  . OXYGEN Inhale 3 L/min into the lungs daily as needed (On exertion and at home).   . pantoprazole (PROTONIX) 40 MG tablet Take 1 tablet (40 mg total) by mouth daily.  . potassium chloride (K-DUR) 10 MEQ tablet Take 6 tablets (60 mEq total) by mouth daily.  . tamoxifen (NOLVADEX) 20 MG tablet Take 1 tablet (20 mg total) by mouth daily.  . traMADol (ULTRAM) 50 MG tablet take 1 TABLET BY MOUTH EVERY 8 HOURS  (Patient taking differently: take 1 TABLET BY MOUTH EVERY 8 HOURS AS NEEDED FOR PAIN)  . triamcinolone cream (KENALOG) 0.1 % Apply 1 application topically 3 (three) times daily.   No current facility-administered medications on file prior to visit.    Allergies  Allergen Reactions  . Atorvastatin Other (See Comments)    Doesn't remember  . Diphenhydramine Hcl (Sleep) Hives  . Hydrocodone-Acetaminophen Other (See Comments)    Doesn't remember  . Lopid [Gemfibrozil] Other (See Comments)    Doesn't remember  . Loratadine Hives  . Lorazepam Hives  . Simvastatin Other (See Comments)  . Sulfamethoxazole Hives  . Sulfonamide Derivatives Hives   PMHx:   Past Medical History:  Diagnosis Date  . COPD (chronic obstructive pulmonary disease) ([place])   . Esophageal varices ([place])   . Fibromyalgia   . Gastritis   . Hepatic cirrhosis ([place])   . Hepatic encephalopathy ([place])   . Hyperlipidemia   . Hypertension   . IBS (irritable bowel syndrome)   . Splenomegaly   . Vitamin D deficiency    Immunization History  Administered Date(s) Administered  . Hep A / Hep B 11/05/2013, 11/12/2013, 12/07/2013, 12/31/2014  . Hepatitis B, adult 12/19/2015  . Hepatitis B, ped/adol 11/17/2015  . Influenza Split 05/26/2013, 06/17/2014  . Influenza, High Dose Seasonal PF 06/15/2015  . Pneumococcal Conjugate-13 07/22/2015  . Pneumococcal Polysaccharide-23 08/28/2013  . Tdap 11/19/2012   Past Surgical History:  Procedure Laterality Date  . ABDOMINAL HYSTERECTOMY    . BACK SURGERY    . CHOLECYSTECTOMY    . ESOPHAGOGASTRODUODENOSCOPY  07/16/2012   Procedure: ESOPHAGOGASTRODUODENOSCOPY (EGD);  Surgeon: Inda Castle, MD;  Location: [facility];  Service: Endoscopy;  Laterality: N/A;  . GASTRIC VARICES BANDING  07/16/2012   Procedure: GASTRIC VARICES BANDING;  Surgeon: Inda Castle, MD;  Location: [facility];  Service: Endoscopy;  Laterality: N/A;  . HIP ARTHROPLASTY Bilateral   . NECK SURGERY    .  SHOULDER SURGERY Right    FHx:    Reviewed / unchanged  SHx:    Reviewed / unchanged  Systems Review:  Constitutional: Denies fever, chills, wt changes, headaches, insomnia, fatigue, night sweats, change in appetite. Eyes: Denies redness, blurred vision, diplopia, discharge, itchy, watery eyes.  ENT: Denies discharge, congestion, post nasal drip, epistaxis, sore throat, earache, hearing loss, dental pain, tinnitus, vertigo, sinus pain, snoring.  CV: Denies chest pain, palpitations, irregular heartbeat, syncope, dyspnea, diaphoresis, orthopnea, PND, claudication or edema. Respiratory: denies cough, dyspnea, DOE, pleurisy, hoarseness, laryngitis, wheezing.  Gastrointestinal: Denies dysphagia, odynophagia, heartburn, reflux, water brash, abdominal pain or cramps, nausea, vomiting, bloating, diarrhea, constipation, hematemesis, melena, hematochezia  or hemorrhoids. Genitourinary: Denies dysuria, frequency, urgency, nocturia, hesitancy,  discharge, hematuria or flank pain. Musculoskeletal: Denies arthralgias, myalgias, stiffness, jt. swelling, pain, limping or strain/sprain.  Skin: Denies pruritus, rash, hives, warts, acne, eczema or change in skin lesion(s). Neuro: No weakness, tremor, incoordination, spasms, paresthesia or pain. Psychiatric: Denies confusion, memory loss or sensory loss. Endo: Denies change in weight, skin or hair change.  Heme/Lymph: No excessive bleeding, bruising or enlarged lymph nodes.  Physical Exam BP (!) 114/58   Pulse 72   Temp 98.1 F (36.7 C)   Resp 16   Ht 5' 2"  (1.575 m)   Wt 180 lb (81.6 kg)   BMI 32.92 kg/m   Appears over nourished and in no distress.  Eyes: PERRLA, EOMs, conjunctiva no swelling or erythema. Sinuses: No frontal/maxillary tenderness ENT/Mouth: EAC's clear, TM's nl w/o erythema, bulging. Nares clear w/o erythema, swelling, exudates. Oropharynx clear without erythema or exudates. Oral hygiene is good. Tongue normal, non obstructing.  Hearing intact.  Neck: Supple. Thyroid nl. Car 2+/2+ without bruits, nodes or JVD. Chest: Respirations nl with BS clear & equal w/o rales, rhonchi, wheezing or stridor.  Cor: Heart sounds normal w/ regular rate and rhythm without sig. murmurs, gallops, clicks, or rubs. Peripheral pulses normal and equal  without edema.  Abdomen: Soft & bowel sounds normal. Non-tender w/o guarding, rebound, hernias, masses, or organomegaly.  Lymphatics: Unremarkable.  Musculoskeletal:  Non -Ambulatory in a wheel chair. Skin: Warm, dry without exposed rashes, lesions or ecchymosis apparent.  Neuro: Cranial nerves intact, reflexes equal bilaterally. Sensory-motor testing grossly intact. Tendon reflexes grossly intact. No Asterixis today.   Pysch: Awake & oriented x 3.  Insight dulled. Affect flat.   Assessment and Plan:  1. Essential hypertension  - Continue medication, monitor blood pressure at home. Continue DASH diet. Reminder to go to the ER if any CP, SOB, nausea, dizziness, severe HA, changes vision/speech, left arm numbness and tingling and jaw pain. - TSH  2. Hyperlipidemia  - Continue diet/meds, exercise,& lifestyle modifications. Continue monitor periodic cholesterol/liver & renal functions  - Lipid panel - TSH  3. Type 2 diabetes mellitus with stage 3 chronic kidney disease, without long-term current use of insulin (HCC)  - Continue diet, exercise, lifestyle modifications. Monitor appropriate labs. - Hemoglobin A1c - Insulin, random  4. Vitamin D deficiency  - Continue supplementation. - VITAMIN D 25 Hydroxy   5. Cirrhosis of liver with ascites(HCC)  - AMMONIA  6. Chronic bronchitis ([place])   7. Hepatic encephalopathy ([place])   8. Idiopathic gout  - Uric acid  9. Medication management  - CBC with Differential/Platelet - BASIC METABOLIC PANEL WITH GFR - Hepatic function panel - Magnesium      Recommended regular exercise, BP monitoring, weight control, and discussed med and  SE's. Recommended labs to assess and monitor clinical status. Further disposition pending results of labs. Over 30 minutes of exam, counseling, chart review was performed

## 2016-04-20 LAB — AMMONIA: AMMONIA: 90 umol/L — AB (ref <=47)

## 2016-04-20 LAB — HEMOGLOBIN A1C
Hgb A1c MFr Bld: 6.4 % — ABNORMAL HIGH (ref <5.7)
MEAN PLASMA GLUCOSE: 137 mg/dL

## 2016-04-20 LAB — MAGNESIUM: MAGNESIUM: 2 mg/dL (ref 1.5–2.5)

## 2016-04-20 LAB — HEPATIC FUNCTION PANEL
ALK PHOS: 111 U/L (ref 33–130)
ALT: 13 U/L (ref 6–29)
AST: 30 U/L (ref 10–35)
Albumin: 3.2 g/dL — ABNORMAL LOW (ref 3.6–5.1)
BILIRUBIN DIRECT: 0.2 mg/dL (ref <=0.2)
Indirect Bilirubin: 0.5 mg/dL (ref 0.2–1.2)
Total Bilirubin: 0.7 mg/dL (ref 0.2–1.2)
Total Protein: 5.1 g/dL — ABNORMAL LOW (ref 6.1–8.1)

## 2016-04-20 LAB — BASIC METABOLIC PANEL WITH GFR
BUN: 13 mg/dL (ref 7–25)
CHLORIDE: 94 mmol/L — AB (ref 98–110)
CO2: 33 mmol/L — ABNORMAL HIGH (ref 20–31)
CREATININE: 1.27 mg/dL — AB (ref 0.50–0.99)
Calcium: 8.9 mg/dL (ref 8.6–10.4)
GFR, Est African American: 50 mL/min — ABNORMAL LOW (ref >=60)
GFR, Est Non African American: 43 mL/min — ABNORMAL LOW (ref >=60)
Glucose, Bld: 185 mg/dL — ABNORMAL HIGH (ref 65–99)
POTASSIUM: 3.2 mmol/L — AB (ref 3.5–5.3)
SODIUM: 138 mmol/L (ref 135–146)

## 2016-04-20 LAB — LIPID PANEL
CHOLESTEROL: 186 mg/dL (ref 125–200)
HDL: 30 mg/dL — ABNORMAL LOW (ref >=46)
LDL Cholesterol: 85 mg/dL (ref <130)
TRIGLYCERIDES: 356 mg/dL — AB (ref <150)
Total CHOL/HDL Ratio: 6.2 Ratio — ABNORMAL HIGH (ref <=5.0)
VLDL: 71 mg/dL — ABNORMAL HIGH (ref <30)

## 2016-04-20 LAB — VITAMIN D 25 HYDROXY (VIT D DEFICIENCY, FRACTURES): VIT D 25 HYDROXY: 42 ng/mL (ref 30–100)

## 2016-04-20 LAB — INSULIN, RANDOM: INSULIN: 73.2 u[IU]/mL — AB (ref 2.0–19.6)

## 2016-04-20 LAB — URIC ACID: URIC ACID, SERUM: 5.9 mg/dL (ref 2.5–7.0)

## 2016-04-26 DIAGNOSIS — H2512 Age-related nuclear cataract, left eye: Secondary | ICD-10-CM | POA: Diagnosis not present

## 2016-04-26 DIAGNOSIS — H25812 Combined forms of age-related cataract, left eye: Secondary | ICD-10-CM | POA: Diagnosis not present

## 2016-04-26 DIAGNOSIS — H268 Other specified cataract: Secondary | ICD-10-CM | POA: Diagnosis not present

## 2016-04-30 DIAGNOSIS — M7542 Impingement syndrome of left shoulder: Secondary | ICD-10-CM | POA: Diagnosis not present

## 2016-04-30 DIAGNOSIS — M25512 Pain in left shoulder: Secondary | ICD-10-CM | POA: Diagnosis not present

## 2016-05-15 DIAGNOSIS — N183 Chronic kidney disease, stage 3 (moderate): Secondary | ICD-10-CM | POA: Diagnosis not present

## 2016-05-15 DIAGNOSIS — K7581 Nonalcoholic steatohepatitis (NASH): Secondary | ICD-10-CM | POA: Diagnosis not present

## 2016-05-15 DIAGNOSIS — E1122 Type 2 diabetes mellitus with diabetic chronic kidney disease: Secondary | ICD-10-CM | POA: Diagnosis not present

## 2016-05-15 DIAGNOSIS — J9621 Acute and chronic respiratory failure with hypoxia: Secondary | ICD-10-CM | POA: Diagnosis not present

## 2016-05-15 DIAGNOSIS — R261 Paralytic gait: Secondary | ICD-10-CM | POA: Diagnosis not present

## 2016-05-15 DIAGNOSIS — I129 Hypertensive chronic kidney disease with stage 1 through stage 4 chronic kidney disease, or unspecified chronic kidney disease: Secondary | ICD-10-CM | POA: Diagnosis not present

## 2016-05-15 DIAGNOSIS — Z7982 Long term (current) use of aspirin: Secondary | ICD-10-CM | POA: Diagnosis not present

## 2016-05-15 DIAGNOSIS — E1165 Type 2 diabetes mellitus with hyperglycemia: Secondary | ICD-10-CM | POA: Diagnosis not present

## 2016-05-15 DIAGNOSIS — J449 Chronic obstructive pulmonary disease, unspecified: Secondary | ICD-10-CM | POA: Diagnosis not present

## 2016-05-15 DIAGNOSIS — M6281 Muscle weakness (generalized): Secondary | ICD-10-CM | POA: Diagnosis not present

## 2016-05-19 DIAGNOSIS — J449 Chronic obstructive pulmonary disease, unspecified: Secondary | ICD-10-CM | POA: Diagnosis not present

## 2016-05-21 ENCOUNTER — Ambulatory Visit (INDEPENDENT_AMBULATORY_CARE_PROVIDER_SITE_OTHER): Payer: PPO | Admitting: Gastroenterology

## 2016-05-21 DIAGNOSIS — K7469 Other cirrhosis of liver: Secondary | ICD-10-CM | POA: Diagnosis not present

## 2016-05-21 DIAGNOSIS — Z23 Encounter for immunization: Secondary | ICD-10-CM

## 2016-05-22 ENCOUNTER — Encounter: Payer: Self-pay | Admitting: Internal Medicine

## 2016-05-22 ENCOUNTER — Ambulatory Visit (INDEPENDENT_AMBULATORY_CARE_PROVIDER_SITE_OTHER): Payer: PPO | Admitting: Internal Medicine

## 2016-05-22 ENCOUNTER — Ambulatory Visit: Payer: Self-pay | Admitting: Internal Medicine

## 2016-05-22 VITALS — BP 128/66 | HR 68 | Temp 98.6°F | Resp 16 | Ht 62.0 in

## 2016-05-22 DIAGNOSIS — Z23 Encounter for immunization: Secondary | ICD-10-CM

## 2016-05-22 DIAGNOSIS — Z79899 Other long term (current) drug therapy: Secondary | ICD-10-CM | POA: Diagnosis not present

## 2016-05-22 DIAGNOSIS — K7460 Unspecified cirrhosis of liver: Secondary | ICD-10-CM | POA: Diagnosis not present

## 2016-05-22 DIAGNOSIS — R7989 Other specified abnormal findings of blood chemistry: Secondary | ICD-10-CM

## 2016-05-22 LAB — CBC WITH DIFFERENTIAL/PLATELET
BASOS PCT: 1 %
Basophils Absolute: 58 cells/uL (ref 0–200)
EOS PCT: 8 %
Eosinophils Absolute: 464 cells/uL (ref 15–500)
HCT: 35.3 % (ref 35.0–45.0)
HEMOGLOBIN: 11.4 g/dL — AB (ref 11.7–15.5)
LYMPHS ABS: 1218 {cells}/uL (ref 850–3900)
Lymphocytes Relative: 21 %
MCH: 30.7 pg (ref 27.0–33.0)
MCHC: 32.3 g/dL (ref 32.0–36.0)
MCV: 95.1 fL (ref 80.0–100.0)
MONOS PCT: 5 %
MPV: 12.3 fL (ref 7.5–12.5)
Monocytes Absolute: 290 cells/uL (ref 200–950)
NEUTROS ABS: 3770 {cells}/uL (ref 1500–7800)
Neutrophils Relative %: 65 %
PLATELETS: 114 10*3/uL — AB (ref 140–400)
RBC: 3.71 MIL/uL — AB (ref 3.80–5.10)
RDW: 15 % (ref 11.0–15.0)
WBC: 5.8 10*3/uL (ref 3.8–10.8)

## 2016-05-22 NOTE — Progress Notes (Signed)
Assessment and Plan:   1. Need for prophylactic vaccination and inoculation against influenza  - Flu Vaccine QUAD with presevative  2. Increased ammonia level  - AMMONIA  3. Cirrhosis of liver without ascites, unspecified hepatic cirrhosis type (HCC) -lactulose to be dosed at least twice nightly - AMMONIA  4. Medication management  - CBC with Differential/Platelet - BASIC METABOLIC PANEL WITH GFR - Hepatic function panel    HPI 70 y.o.female presents for 1 month follow up of hepatic cirrhosis and increasing ammonia levels . Patient reports that they have been doing well.  She is still only taking 1 dose of lactulose a day.  She is still doing PT at home which is helping.  She reports that overall she feels pretty good.   female is taking their medication.  She is having no new issues.  Her weights and blood sugars are staying stable at home.  She rarely needs to take extra fluid pills currently.  She has not been to see Dr. Havery Moros recently.   Past Medical History:  Diagnosis Date  . COPD (chronic obstructive pulmonary disease) ([place])   . Esophageal varices ([place])   . Fibromyalgia   . Gastritis   . Hepatic cirrhosis ([place])   . Hepatic encephalopathy ([place])   . Hyperlipidemia   . Hypertension   . IBS (irritable bowel syndrome)   . Splenomegaly   . Vitamin D deficiency      Allergies  Allergen Reactions  . Atorvastatin Other (See Comments)    Doesn't remember  . Diphenhydramine Hcl (Sleep) Hives  . Hydrocodone-Acetaminophen Other (See Comments)    Doesn't remember  . Lopid [Gemfibrozil] Other (See Comments)    Doesn't remember  . Loratadine Hives  . Lorazepam Hives  . Simvastatin Other (See Comments)  . Sulfamethoxazole Hives  . Sulfonamide Derivatives Hives      Current Outpatient Prescriptions on File Prior to Visit  Medication Sig Dispense Refill  . albuterol (PROVENTIL) (2.5 MG/3ML) 0.083% nebulizer solution     . allopurinol (ZYLOPRIM) 300 MG tablet take  1/2-1 tablet by mouth daily for gout 90 tablet 1  . ALPRAZolam (XANAX) 1 MG tablet Take 1 mg by mouth at bedtime as needed for anxiety.    Marland Kitchen aspirin 81 MG tablet Take 81 mg by mouth daily.    . benzonatate (TESSALON) 100 MG capsule Take by mouth daily as needed for cough.    . bisoprolol (ZEBETA) 10 MG tablet Take 1 tablet (10 mg total) by mouth daily. 30 tablet 11  . citalopram (CELEXA) 40 MG tablet Take 1 tablet (40 mg total) by mouth 2 (two) times daily. 60 tablet 3  . cyclobenzaprine (FLEXERIL) 10 MG tablet Take 1 tablet (10 mg total) by mouth 3 (three) times daily as needed for muscle spasms. 90 tablet 2  . diclofenac sodium (VOLTAREN) 1 % GEL Apply 1 application topically 4 (four) times daily as needed. pain  4  . ferrous sulfate 325 (65 FE) MG tablet Take 325 mg by mouth daily with breakfast.    . furosemide (LASIX) 80 MG tablet Take 80 mg in the morning and one-half tablet (40 mg) in the evenings 60 tablet 6  . gabapentin (NEURONTIN) 300 MG capsule take 1 capsule by mouth in the morning, 1 capsule in the afternoon and 1-3 capsules at night 120 capsule 2  . ipratropium (ATROVENT) 0.02 % nebulizer solution use 1 vial in nebulizer EVERY 4 HOURS AS NEEDED FOR WHEEZING OR SHORTNESS OF BREATH 75 mL  2  . lactulose (CHRONULAC) 10 GM/15ML solution Take 30 ml (1 ounce) 2 x day (Patient taking differently: Take 20 g by mouth 2 (two) times daily. ) 1892 mL 99  . Magnesium 250 MG TABS Take 250 mg by mouth daily at 6 (six) AM.    . ondansetron (ZOFRAN) 4 MG tablet Take 1 tablet (4 mg total) by mouth daily as needed for nausea or vomiting. 30 tablet 1  . OXYGEN Inhale 3 L/min into the lungs daily as needed (On exertion and at home).     . pantoprazole (PROTONIX) 40 MG tablet Take 1 tablet (40 mg total) by mouth daily. 30 tablet 1  . potassium chloride (K-DUR) 10 MEQ tablet Take 6 tablets (60 mEq total) by mouth daily. 180 tablet 3  . tamoxifen (NOLVADEX) 20 MG tablet Take 1 tablet (20 mg total) by mouth  daily. 90 tablet 2  . traMADol (ULTRAM) 50 MG tablet take 1 TABLET BY MOUTH EVERY 8 HOURS (Patient taking differently: take 1 TABLET BY MOUTH EVERY 8 HOURS AS NEEDED FOR PAIN) 90 tablet 1  . triamcinolone cream (KENALOG) 0.1 % Apply 1 application topically 3 (three) times daily. 45 g 0   No current facility-administered medications on file prior to visit.     ROS: all negative except above.   Physical Exam: There were no vitals filed for this visit. BP 128/66   Pulse 68   Temp 98.6 F (37 C) (Temporal)   Resp 16   Ht 5' 2"  (1.575 m)  General Appearance: Chronically ill appearing on [place] O2, Well developed well nourished, non-toxic appearing in no apparent distress. Eyes: PERRLA, EOMs, conjunctiva w/ no swelling or erythema or discharge Sinuses: No Frontal/maxillary tenderness ENT/Mouth: Ear canals clear without swelling or erythema.  TM's normal bilaterally with no retractions, bulging, or loss of landmarks.   Neck: Supple, thyroid normal, no notable JVD  Respiratory: Respiratory effort normal, Clear breath sounds anteriorly and posteriorly bilaterally without rales, rhonchi, or stridor.  Scattered end expiratory wheezes bilaterally, No retractions or accessory muscle usage. Cardio: RRR with no RGs.   Abdomen: Soft, + BS.  Non tender, no guarding, rebound, hernias, masses.  Musculoskeletal: Full ROM, 5/5 strength, normal gait.  Skin: Warm, dry without rashes  Neuro: Awake and oriented X 3, Cranial nerves intact. Normal muscle tone, no cerebellar symptoms. Sensation intact.  Psych: normal affect, Insight and Judgment appropriate.     Starlyn Skeans, PA-C 4:04 PM [facility]

## 2016-05-23 LAB — HEPATIC FUNCTION PANEL
ALBUMIN: 3.6 g/dL (ref 3.6–5.1)
ALK PHOS: 103 U/L (ref 33–130)
ALT: 14 U/L (ref 6–29)
AST: 32 U/L (ref 10–35)
BILIRUBIN DIRECT: 0.3 mg/dL — AB (ref <=0.2)
BILIRUBIN TOTAL: 1 mg/dL (ref 0.2–1.2)
Indirect Bilirubin: 0.7 mg/dL (ref 0.2–1.2)
Total Protein: 5.7 g/dL — ABNORMAL LOW (ref 6.1–8.1)

## 2016-05-23 LAB — BASIC METABOLIC PANEL WITH GFR
BUN: 13 mg/dL (ref 7–25)
CHLORIDE: 94 mmol/L — AB (ref 98–110)
CO2: 34 mmol/L — AB (ref 20–31)
CREATININE: 1.04 mg/dL — AB (ref 0.50–0.99)
Calcium: 8.9 mg/dL (ref 8.6–10.4)
GFR, EST NON AFRICAN AMERICAN: 55 mL/min — AB (ref >=60)
GFR, Est African American: 63 mL/min (ref >=60)
Glucose, Bld: 174 mg/dL — ABNORMAL HIGH (ref 65–99)
Potassium: 3.1 mmol/L — ABNORMAL LOW (ref 3.5–5.3)
SODIUM: 141 mmol/L (ref 135–146)

## 2016-05-23 LAB — AMMONIA: Ammonia: 63 umol/L — ABNORMAL HIGH (ref <=47)

## 2016-05-24 DIAGNOSIS — Z9181 History of falling: Secondary | ICD-10-CM

## 2016-05-24 DIAGNOSIS — Z87891 Personal history of nicotine dependence: Secondary | ICD-10-CM | POA: Diagnosis not present

## 2016-05-24 DIAGNOSIS — R261 Paralytic gait: Secondary | ICD-10-CM | POA: Diagnosis not present

## 2016-05-24 DIAGNOSIS — N183 Chronic kidney disease, stage 3 (moderate): Secondary | ICD-10-CM | POA: Diagnosis not present

## 2016-05-24 DIAGNOSIS — E1122 Type 2 diabetes mellitus with diabetic chronic kidney disease: Secondary | ICD-10-CM | POA: Diagnosis not present

## 2016-05-24 DIAGNOSIS — E1165 Type 2 diabetes mellitus with hyperglycemia: Secondary | ICD-10-CM | POA: Diagnosis not present

## 2016-05-24 DIAGNOSIS — I129 Hypertensive chronic kidney disease with stage 1 through stage 4 chronic kidney disease, or unspecified chronic kidney disease: Secondary | ICD-10-CM | POA: Diagnosis not present

## 2016-05-24 DIAGNOSIS — Z9981 Dependence on supplemental oxygen: Secondary | ICD-10-CM | POA: Diagnosis not present

## 2016-05-24 DIAGNOSIS — J9621 Acute and chronic respiratory failure with hypoxia: Secondary | ICD-10-CM | POA: Diagnosis not present

## 2016-05-24 DIAGNOSIS — M6281 Muscle weakness (generalized): Secondary | ICD-10-CM | POA: Diagnosis not present

## 2016-05-24 DIAGNOSIS — J449 Chronic obstructive pulmonary disease, unspecified: Secondary | ICD-10-CM | POA: Diagnosis not present

## 2016-05-24 DIAGNOSIS — Z7982 Long term (current) use of aspirin: Secondary | ICD-10-CM | POA: Diagnosis not present

## 2016-05-24 DIAGNOSIS — K7581 Nonalcoholic steatohepatitis (NASH): Secondary | ICD-10-CM | POA: Diagnosis not present

## 2016-05-26 ENCOUNTER — Encounter (HOSPITAL_COMMUNITY): Payer: Self-pay | Admitting: Nurse Practitioner

## 2016-05-26 DIAGNOSIS — I10 Essential (primary) hypertension: Secondary | ICD-10-CM | POA: Diagnosis not present

## 2016-05-26 DIAGNOSIS — Z7982 Long term (current) use of aspirin: Secondary | ICD-10-CM | POA: Insufficient documentation

## 2016-05-26 DIAGNOSIS — K769 Liver disease, unspecified: Secondary | ICD-10-CM | POA: Diagnosis not present

## 2016-05-26 DIAGNOSIS — J189 Pneumonia, unspecified organism: Secondary | ICD-10-CM | POA: Diagnosis not present

## 2016-05-26 DIAGNOSIS — Z87891 Personal history of nicotine dependence: Secondary | ICD-10-CM | POA: Insufficient documentation

## 2016-05-26 DIAGNOSIS — Z79899 Other long term (current) drug therapy: Secondary | ICD-10-CM | POA: Diagnosis not present

## 2016-05-26 DIAGNOSIS — J449 Chronic obstructive pulmonary disease, unspecified: Secondary | ICD-10-CM | POA: Insufficient documentation

## 2016-05-26 NOTE — ED Triage Notes (Signed)
Pt reports fever and malaise since receiving a flu shot. Hx of liver disease.

## 2016-05-27 ENCOUNTER — Emergency Department (HOSPITAL_COMMUNITY)
Admission: EM | Admit: 2016-05-27 | Discharge: 2016-05-27 | Disposition: A | Discharge status: Home / Self Care | Payer: PPO | Attending: Emergency Medicine | Admitting: Emergency Medicine

## 2016-05-27 ENCOUNTER — Emergency Department (HOSPITAL_COMMUNITY): Payer: PPO

## 2016-05-27 DIAGNOSIS — J189 Pneumonia, unspecified organism: Secondary | ICD-10-CM

## 2016-05-27 LAB — URINALYSIS, ROUTINE W REFLEX MICROSCOPIC
BILIRUBIN URINE: NEGATIVE
GLUCOSE, UA: NEGATIVE mg/dL
Hgb urine dipstick: NEGATIVE
KETONES UR: NEGATIVE mg/dL
LEUKOCYTES UA: NEGATIVE
Nitrite: NEGATIVE
PH: 6 (ref 5.0–8.0)
Protein, ur: NEGATIVE mg/dL
SPECIFIC GRAVITY, URINE: 1.014 (ref 1.005–1.030)

## 2016-05-27 LAB — CBC WITH DIFFERENTIAL/PLATELET
Basophils Absolute: 0 10*3/uL (ref 0.0–0.1)
Basophils Relative: 0 %
Eosinophils Absolute: 0.2 10*3/uL (ref 0.0–0.7)
Eosinophils Relative: 2 %
HEMATOCRIT: 34.6 % — AB (ref 36.0–46.0)
HEMOGLOBIN: 10.8 g/dL — AB (ref 12.0–15.0)
LYMPHS PCT: 9 %
Lymphs Abs: 0.8 10*3/uL (ref 0.7–4.0)
MCH: 31.9 pg (ref 26.0–34.0)
MCHC: 31.2 g/dL (ref 30.0–36.0)
MCV: 102.1 fL — AB (ref 78.0–100.0)
MONO ABS: 0.5 10*3/uL (ref 0.1–1.0)
MONOS PCT: 5 %
NEUTROS ABS: 7.6 10*3/uL (ref 1.7–7.7)
NEUTROS PCT: 84 %
Platelets: 79 10*3/uL — ABNORMAL LOW (ref 150–400)
RBC: 3.39 MIL/uL — ABNORMAL LOW (ref 3.87–5.11)
RDW: 15.5 % (ref 11.5–15.5)
WBC: 9 10*3/uL (ref 4.0–10.5)

## 2016-05-27 LAB — COMPREHENSIVE METABOLIC PANEL
ALBUMIN: 3.3 g/dL — AB (ref 3.5–5.0)
ALT: 17 U/L (ref 14–54)
ANION GAP: 6 (ref 5–15)
AST: 33 U/L (ref 15–41)
Alkaline Phosphatase: 94 U/L (ref 38–126)
BUN: 14 mg/dL (ref 6–20)
CHLORIDE: 103 mmol/L (ref 101–111)
CO2: 29 mmol/L (ref 22–32)
Calcium: 8.5 mg/dL — ABNORMAL LOW (ref 8.9–10.3)
Creatinine, Ser: 1.1 mg/dL — ABNORMAL HIGH (ref 0.44–1.00)
GFR calc non Af Amer: 50 mL/min — ABNORMAL LOW (ref >60)
GFR, EST AFRICAN AMERICAN: 58 mL/min — AB (ref >60)
GLUCOSE: 152 mg/dL — AB (ref 65–99)
POTASSIUM: 4.2 mmol/L (ref 3.5–5.1)
SODIUM: 138 mmol/L (ref 135–145)
Total Bilirubin: 2.2 mg/dL — ABNORMAL HIGH (ref 0.3–1.2)
Total Protein: 6 g/dL — ABNORMAL LOW (ref 6.5–8.1)

## 2016-05-27 LAB — PROTIME-INR
INR: 1.14
Prothrombin Time: 14.7 seconds (ref 11.4–15.2)

## 2016-05-27 LAB — AMMONIA: Ammonia: 26 umol/L (ref 9–35)

## 2016-05-27 LAB — APTT: APTT: 29 s (ref 24–36)

## 2016-05-27 LAB — I-STAT CG4 LACTIC ACID, ED: LACTIC ACID, VENOUS: 1.68 mmol/L (ref 0.5–1.9)

## 2016-05-27 MED ORDER — LEVOFLOXACIN 750 MG PO TABS: 750.0000 mg | ORAL_TABLET | Freq: Every day | ORAL | 0 refills | Status: AC

## 2016-05-27 MED ORDER — SODIUM CHLORIDE 0.9 % IV BOLUS (SEPSIS)
1000.0000 mL | Freq: Once | INTRAVENOUS | Status: AC
  Administered 2016-05-27: 1000 mL via INTRAVENOUS

## 2016-05-27 MED ORDER — LEVOFLOXACIN IN D5W 750 MG/150ML IV SOLN
750.0000 mg | Freq: Once | INTRAVENOUS | Status: AC
  Administered 2016-05-27: 750 mg via INTRAVENOUS
  Filled 2016-05-27: qty 150

## 2016-05-27 MED ORDER — LEVALBUTEROL HCL 1.25 MG/0.5ML IN NEBU
1.2500 mg | INHALATION_SOLUTION | Freq: Once | RESPIRATORY_TRACT | Status: AC
  Administered 2016-05-27: 1.25 mg via RESPIRATORY_TRACT
  Filled 2016-05-27: qty 0.5

## 2016-05-27 MED ORDER — IPRATROPIUM BROMIDE 0.02 % IN SOLN
1.0000 mg | Freq: Once | RESPIRATORY_TRACT | Status: AC
  Administered 2016-05-27: 1 mg via RESPIRATORY_TRACT
  Filled 2016-05-27: qty 5

## 2016-05-27 MED ORDER — IBUPROFEN 800 MG PO TABS
800.0000 mg | ORAL_TABLET | Freq: Once | ORAL | Status: AC
  Administered 2016-05-27: 800 mg via ORAL
  Filled 2016-05-27: qty 1

## 2016-05-27 MED ORDER — ALBUTEROL SULFATE HFA 108 (90 BASE) MCG/ACT IN AERS: 2.0000 | INHALATION_SPRAY | Freq: Four times a day (QID) | RESPIRATORY_TRACT | 2 refills | Status: AC | PRN

## 2016-05-27 NOTE — ED Provider Notes (Signed)
By signing my name below, I, Gwenlyn Fudge, attest that this documentation has been prepared under the direction and in the presence of [place], DO. Electronically Signed: Gwenlyn Fudge, ED Scribe. 05/27/16. 1:12 AM.   TIME SEEN: 12:40 AM   CHIEF COMPLAINT: General Malaise  HPI: [name] is a 70 y.o. female with PMHx of COPD, Gastritis, Hepatic Cirrhosis 2/2 NASH, HTN and HLD who presents to the Emergency Department complaining of gradual onset, constant generalized discomfort onset 2 days PTA. Describes it as "not feeling well".  She is currently taking Lactulose, but was only able to take 1 of her normal 2 doses today. Husband denies that she has been confused. Pt reports associated headache, nausea, fever, chills, cough, diarrhea (which is baseline for pt).  Has chronic abdominal distention that is unchanged. No abdominal pain. Has never had a paracentesis. Denies any headache currently. No neck pain or neck stiffness. No rash.  No vomiting. Has had nausea but this has improved.  Pt took Tylenol at 9 PM.   Pt is on 3 L/min oxygen at home.  Husband reports that her son was around them recently and he has had similar symptoms with cough and fever.  ROS: See HPI Constitutional: fever  Eyes: no drainage  ENT: no runny nose   Cardiovascular:  no chest pain  Resp: no SOB  GI: no vomiting; positive nausea GU: no dysuria Integumentary: no rash  Allergy: no hives  Musculoskeletal: no leg swelling  Neurological: no slurred speech ROS otherwise negative  PAST MEDICAL HISTORY/PAST SURGICAL HISTORY:  Past Medical History:  Diagnosis Date  . COPD (chronic obstructive pulmonary disease) ([place])   . Esophageal varices ([place])   . Fibromyalgia   . Gastritis   . Hepatic cirrhosis ([place])   . Hepatic encephalopathy ([place])   . Hyperlipidemia   . Hypertension   . IBS (irritable bowel syndrome)   . Splenomegaly   . Vitamin D deficiency     MEDICATIONS:  Prior to Admission medications    Medication Sig Start Date End Date Taking? Authorizing Provider  albuterol (PROVENTIL) (2.5 MG/3ML) 0.083% nebulizer solution  04/11/16   Historical Provider, MD  allopurinol (ZYLOPRIM) 300 MG tablet take 1/2-1 tablet by mouth daily for gout 04/10/16   Unk Pinto, MD  ALPRAZolam Duanne Moron) 1 MG tablet Take 1 mg by mouth at bedtime as needed for anxiety.    Historical Provider, MD  aspirin 81 MG tablet Take 81 mg by mouth daily.    Historical Provider, MD  benzonatate (TESSALON) 100 MG capsule Take by mouth daily as needed for cough.    Historical Provider, MD  bisoprolol (ZEBETA) 10 MG tablet Take 1 tablet (10 mg total) by mouth daily. 09/13/15   Unk Pinto, MD  citalopram (CELEXA) 40 MG tablet Take 1 tablet (40 mg total) by mouth 2 (two) times daily. 02/29/16   Vicie Mutters, PA-C  cyclobenzaprine (FLEXERIL) 10 MG tablet Take 1 tablet (10 mg total) by mouth 3 (three) times daily as needed for muscle spasms. 03/06/16   Unk Pinto, MD  diclofenac sodium (VOLTAREN) 1 % GEL Apply 1 application topically 4 (four) times daily as needed. pain 01/16/16   Historical Provider, MD  ferrous sulfate 325 (65 FE) MG tablet Take 325 mg by mouth daily with breakfast.    Historical Provider, MD  furosemide (LASIX) 80 MG tablet Take 80 mg in the morning and one-half tablet (40 mg) in the evenings 03/06/16   Vicie Mutters, PA-C  gabapentin (  NEURONTIN) 300 MG capsule take 1 capsule by mouth in the morning, 1 capsule in the afternoon and 1-3 capsules at night 02/02/16   Unk Pinto, MD  ipratropium (ATROVENT) 0.02 % nebulizer solution use 1 vial in nebulizer EVERY 4 HOURS AS NEEDED FOR WHEEZING OR SHORTNESS OF BREATH 02/14/16   Unk Pinto, MD  lactulose (CHRONULAC) 10 GM/15ML solution Take 30 ml (1 ounce) 2 x day Patient taking differently: Take 20 g by mouth 2 (two) times daily.  10/05/15 10/04/16  Unk Pinto, MD  Magnesium 250 MG TABS Take 250 mg by mouth daily at 6 (six) AM.    Historical Provider, MD   ondansetron (ZOFRAN) 4 MG tablet Take 1 tablet (4 mg total) by mouth daily as needed for nausea or vomiting. 10/06/15 10/05/16  Vicie Mutters, PA-C  OXYGEN Inhale 3 L/min into the lungs daily as needed (On exertion and at home).     Historical Provider, MD  pantoprazole (PROTONIX) 40 MG tablet Take 1 tablet (40 mg total) by mouth daily. 02/28/16 02/27/17  Vicie Mutters, PA-C  potassium chloride (K-DUR) 10 MEQ tablet Take 6 tablets (60 mEq total) by mouth daily. 03/19/16   Unk Pinto, MD  tamoxifen (NOLVADEX) 20 MG tablet Take 1 tablet (20 mg total) by mouth daily. 11/15/15   Unk Pinto, MD  traMADol (ULTRAM) 50 MG tablet take 1 TABLET BY MOUTH EVERY 8 HOURS Patient taking differently: take 1 TABLET BY MOUTH EVERY 8 HOURS AS NEEDED FOR PAIN 12/28/15   Loma Sousa Forcucci, PA-C  triamcinolone cream (KENALOG) 0.1 % Apply 1 application topically 3 (three) times daily. 03/22/16   Unk Pinto, MD    ALLERGIES:  Allergies  Allergen Reactions  . Atorvastatin Other (See Comments)    Doesn't remember  . Diphenhydramine Hcl (Sleep) Hives  . Hydrocodone-Acetaminophen Other (See Comments)    Doesn't remember  . Lopid [Gemfibrozil] Other (See Comments)    Doesn't remember  . Loratadine Hives  . Lorazepam Hives  . Simvastatin Other (See Comments)  . Sulfamethoxazole Hives  . Sulfonamide Derivatives Hives    SOCIAL HISTORY:  Social History  Substance Use Topics  . Smoking status: Former Smoker    Packs/day: 1.00    Years: 50.00    Types: Cigarettes    Quit date: 11/05/2014  . Smokeless tobacco: Never Used     Comment: Quit April 2016  . Alcohol use No    FAMILY HISTORY: Family History  Problem Relation Age of Onset  . Diabetes Brother     deceased  . Heart disease Sister     A Fib  . Heart disease Mother     CHF  . Atrial fibrillation Sister   . Colon cancer Neg Hx     EXAM: BP (!) 140/43 (BP Location: Left Arm)   Pulse 74   Temp 100.2 F (37.9 C) (Oral)   Resp 20    SpO2 98%  CONSTITUTIONAL: Alert and oriented x3 and responds appropriately to questions. Chronically ill-appearing; well-nourished, elderly, in no distress HEAD: Normocephalic EYES: Conjunctivae clear, PERRL ENT: normal nose; no rhinorrhea; moist mucous membranes NECK: Supple, no meningismus, no LAD  CARD: RRR; S1 and S2 appreciated; no murmurs, no clicks, no rubs, no gallops RESP: Normal chest excursion without splinting or tachypnea; breath sounds equal bilaterally; diffuse expiratory wheezes, no rhonchi, no rales, no hypoxia or respiratory distress, speaking full sentences ABD/GI: Normal bowel sounds; distended with fluid wave, but not tight; soft, non-tender, no rebound, no guarding, no peritoneal signs BACK:  The back appears normal and is non-tender to palpation, there is no CVA tenderness EXT: Normal ROM in all joints; non-tender to palpation; no edema; normal capillary refill; no cyanosis, no calf tenderness or swelling    SKIN: Normal color for age and race; warm; no rash NEURO: Moves all extremities equally, sensation to light touch intact diffusely, cranial nerves II through XII intact PSYCH: The patient's mood and manner are appropriate. Grooming and personal hygiene are appropriate.  MEDICAL DECISION MAKING: Patient here with fever, cough, nausea. Reports her son recently around her with cough. Recently had a flu vaccination. Has had headache, but this is resolved. No signs of meningismus on exam. Diffuse expiratory wheezing on exam but no new hypoxia. We'll give breathing treatments and reassess. We'll obtain labs, cultures, chest x-ray, urine. Abdomen soft and nontender, although mildly distended but not tight. No previous history of paracentesis or SBP.  ED PROGRESS: 2:00 AM  The patient has a rectal temperature of 101.9, but no leukocytosis and normal lactate. Labs otherwise unremarkable. LFTs show no significant change from baseline. Normal coags. Normal ammonia. Lungs are now  clear after breathing treatment and she reports she feels that she is breathing better. No respiratory distress or hypoxia on her normal 3 L. X-ray does show right-sided pneumonia. We'll treat with Levaquin. She would like discharge home if possible with PCP follow-up, and I feel this is a reasonable plan. Urine pending.   2:50 AM  Pt's urine shows no sign of infection. She is still well appearing without any respiratory distress and clear lungs. I feel she is safe to be discharged home with close outpatient follow-up. Patient and husband are comfortable with this plan and would prefer to go home over admission to the hospital. [facility] discharge with albuterol inhaler, Levaquin. Discussed with her at length. Return precautions. She is comfortable with this plan.   At this time, I do not feel there is any life-threatening condition present. I have reviewed and discussed all results (EKG, imaging, lab, urine as appropriate), exam findings with patient/family. I have reviewed nursing notes and appropriate previous records.  I feel the patient is safe to be discharged home without further emergent workup and can continue workup as an outpatient as needed. Discussed usual and customary return precautions. Patient/family verbalize understanding and are comfortable with this plan.  Outpatient follow-up has been provided. All questions have been answered.   EKG Interpretation  Date/Time:  Sunday May 27 2016 01:34:27 EDT Ventricular Rate:  90 PR Interval:    QRS Duration: 127 QT Interval:  420 QTC Calculation: 514 R Axis:   58 Text Interpretation:  Sinus rhythm Right bundle branch block No significant change since last tracing Confirmed by WARD,  DO, KRISTEN ([id number]) on 05/27/2016 1:52:25 AM        I personally performed the services described in this documentation, which was scribed in my presence. The recorded information has been reviewed and is accurate.    [place], DO 05/27/16 [phone]

## 2016-05-27 NOTE — ED Notes (Signed)
Patient transported to X-ray 

## 2016-05-27 NOTE — ED Notes (Addendum)
Disregard 2nd blood culture collection. Only able to obtain 1 set of cultures. See note at [place].

## 2016-05-28 LAB — URINE CULTURE: CULTURE: NO GROWTH

## 2016-05-30 ENCOUNTER — Other Ambulatory Visit: Payer: Self-pay | Admitting: Internal Medicine

## 2016-05-30 ENCOUNTER — Encounter: Payer: Self-pay | Admitting: Internal Medicine

## 2016-05-30 ENCOUNTER — Ambulatory Visit (INDEPENDENT_AMBULATORY_CARE_PROVIDER_SITE_OTHER): Payer: PPO | Admitting: Internal Medicine

## 2016-05-30 VITALS — BP 124/62 | HR 68 | Temp 97.5°F | Resp 18 | Ht 62.0 in | Wt 177.0 lb

## 2016-05-30 DIAGNOSIS — J189 Pneumonia, unspecified organism: Secondary | ICD-10-CM

## 2016-05-30 DIAGNOSIS — J181 Lobar pneumonia, unspecified organism: Secondary | ICD-10-CM | POA: Diagnosis not present

## 2016-05-30 DIAGNOSIS — J449 Chronic obstructive pulmonary disease, unspecified: Secondary | ICD-10-CM

## 2016-05-30 DIAGNOSIS — R0902 Hypoxemia: Principal | ICD-10-CM

## 2016-05-30 NOTE — Progress Notes (Signed)
[facility]   Unk Pinto, M.D.    Uvaldo Bristle. Silverio Lay, P.A.-C      Starlyn Skeans, P.A.-C  [facility]                [address] [place] [address] Telephone [phone] Telefax [phone] Subjective:    Patient ID: [name], female    DOB: 11/21/1945, 70 y.o.   MRN: [id number]  HPI  This 70 yo MWF with NASH/ES Cirrhosis, O2 dep COPD, was treated in the ER on 69.22.2017 for a query RUL PNA with Levaquin and has completed 3 /7 days of therapy and apparently has significantly improved defervesing and stable on her 2 lit O2.  Husband closely monitors daily weights and titrates her Lactulose and diuretics with weight down from 182# on 10.22.2017 to today's wt of 177#.   Medication Sig  . albuterol  HFA inhaler  2 puffs  every 6  hours as needed  . albuterol  (2.5 MG/3ML)  neb soln Take 2.5 mg by neb every 4 (four) hours as needed   . allopurinol  300 MG tablet take 1 tab daily  . ALPRAZolam  1 MG tablet Take  at bedtime as needed for anxiety.  Marland Kitchen aspirin 81 MG tablet Take  at bedtime.   . benzonatate  100 MG capsule Take  daily as needed for cough.  . bisoprolol (ZEBETA) 10 MG tablet Take 1 tab daily.  . citalopram (CELEXA) 40 MG tablet Take 1 tab 2 (two) times daily.  . cyclobenzaprine  10 MG tablet Take 1 tab 3  times daily as needed for muscle spasms.  . ferrous sulfate 325 (65 FE) MG tablet Take daily with breakfast.  . furosemide 80 MG tablet Take 80 mg  morning and 40 mg evenings  . Gabapentin 300 MG capsule take 1 cap morning & afternoon and 1-3 cap at night  . ATROVENT 0.02 % neb soln  1 vial in neb EVERY 4 HRS AS NEEDED   . lactulose  10 GM/15ML solution Take 1 ounce 2 x day   . levofloxacin (LEVAQUIN) 750 MG tablet Take 1 tablet daily For 4 more days  . Magnesium 250 MG TABS Take  at bedtime.   . ondansetron (ZOFRAN) 4 MG tablet Take 1 tab daily as needed for nausea or  vomiting.  . OXYGEN Inhale 3 L/min On exertion  . pantoprazole  40 MG tablet Take 1 tab daily.  . potassium cl  10 MEQ tablet TAKE 20 MEQ  3 x DAILY  . tamoxifen 20 MG tablet Take 1 tablet (20 mg total) by mouth daily.  . traMADol  50 MG tablet TAKE EVERY 8 HOURS AS NEEDED FOR PAIN   Allergies  Allergen Reactions  . Atorvastatin Other (See Comments)    Doesn't remember  . Diphenhydramine Hcl (Sleep) Hives  . Hydrocodone-Acetaminophen Other (See Comments)    Doesn't remember  . Lopid [Gemfibrozil] Other (See Comments)    Doesn't remember  . Loratadine Hives  . Lorazepam Hives  . Simvastatin Other (See Comments)  . Sulfamethoxazole Hives  . Sulfonamide Derivatives Hives   Past Medical History:  Diagnosis Date  . COPD (chronic obstructive pulmonary disease) ([place])   . Esophageal varices ([place])   . Fibromyalgia   . Gastritis   . Hepatic cirrhosis ([place])   . Hepatic encephalopathy ([place])   .  Hyperlipidemia   . Hypertension   . IBS (irritable bowel syndrome)   . Splenomegaly   . Vitamin D deficiency    Review of Systems     10 point systems review negative except as above.    Objective:   Physical Exam  BP 124/62   Pulse 68   Temp 97.5 F (36.4 C)   Resp 18   Ht 5' 2"  (1.575 m)   Wt 177 lb (80.3 kg)   BMI 32.37 kg/m   In No Distress. O2 sat 96% HEENT - Eac's patent. TM's Nl. EOM's full. PERRLA. NasoOroPharynx clear. Neck - supple. Nl Thyroid. Carotids 2+ & No bruits, nodes, JVD Chest - Decreased BS w/o Rales, rhonchi, wheezes. Cor - Nl HS. RRR w/o sig MGR.    1-2+ankle edema. Abd - Soft w/o  palpable organomegaly, masses or tenderness. BS nl. MS- FROM w/o deformities. Muscle power, tone and bulk Nl. In Wheelchair Neuro - No obvious Cr N abnormalities.  Nl w/o focal abnormalities.    Assessment & Plan:   1. Pneumonia of right upper lobe due to infectious organism ([place])  - continue Levaquin & ROV 5 days to recheck

## 2016-05-31 ENCOUNTER — Other Ambulatory Visit: Payer: Self-pay | Admitting: Internal Medicine

## 2016-05-31 ENCOUNTER — Telehealth: Payer: Self-pay | Admitting: Internal Medicine

## 2016-05-31 DIAGNOSIS — Z7982 Long term (current) use of aspirin: Secondary | ICD-10-CM | POA: Diagnosis not present

## 2016-05-31 DIAGNOSIS — R261 Paralytic gait: Secondary | ICD-10-CM | POA: Diagnosis not present

## 2016-05-31 DIAGNOSIS — E1122 Type 2 diabetes mellitus with diabetic chronic kidney disease: Secondary | ICD-10-CM | POA: Diagnosis not present

## 2016-05-31 DIAGNOSIS — J9621 Acute and chronic respiratory failure with hypoxia: Secondary | ICD-10-CM | POA: Diagnosis not present

## 2016-05-31 DIAGNOSIS — M6281 Muscle weakness (generalized): Secondary | ICD-10-CM | POA: Diagnosis not present

## 2016-05-31 DIAGNOSIS — I129 Hypertensive chronic kidney disease with stage 1 through stage 4 chronic kidney disease, or unspecified chronic kidney disease: Secondary | ICD-10-CM | POA: Diagnosis not present

## 2016-05-31 DIAGNOSIS — J449 Chronic obstructive pulmonary disease, unspecified: Secondary | ICD-10-CM | POA: Diagnosis not present

## 2016-05-31 DIAGNOSIS — N183 Chronic kidney disease, stage 3 (moderate): Secondary | ICD-10-CM | POA: Diagnosis not present

## 2016-05-31 DIAGNOSIS — E1165 Type 2 diabetes mellitus with hyperglycemia: Secondary | ICD-10-CM | POA: Diagnosis not present

## 2016-05-31 DIAGNOSIS — K7581 Nonalcoholic steatohepatitis (NASH): Secondary | ICD-10-CM | POA: Diagnosis not present

## 2016-05-31 NOTE — Telephone Encounter (Signed)
Spoke with Mr Jorgenson, prefers [facility] for O2 supplies. Contacted Jason at [facility], he retrieved POC order from [place]. Will schedule evaluation and administration of POC (portable oxygen concentrator).  [name]

## 2016-06-01 LAB — CULTURE, BLOOD (ROUTINE X 2): Culture: NO GROWTH

## 2016-06-04 DIAGNOSIS — M961 Postlaminectomy syndrome, not elsewhere classified: Secondary | ICD-10-CM | POA: Diagnosis not present

## 2016-06-04 DIAGNOSIS — M5136 Other intervertebral disc degeneration, lumbar region: Secondary | ICD-10-CM | POA: Diagnosis not present

## 2016-06-05 ENCOUNTER — Ambulatory Visit: Payer: Self-pay | Admitting: Internal Medicine

## 2016-06-05 ENCOUNTER — Other Ambulatory Visit: Payer: Self-pay | Admitting: Physical Medicine and Rehabilitation

## 2016-06-05 DIAGNOSIS — M545 Low back pain: Principal | ICD-10-CM

## 2016-06-05 DIAGNOSIS — G8929 Other chronic pain: Secondary | ICD-10-CM

## 2016-06-06 ENCOUNTER — Other Ambulatory Visit: Payer: PPO

## 2016-06-06 ENCOUNTER — Other Ambulatory Visit: Payer: Self-pay | Admitting: Internal Medicine

## 2016-06-06 DIAGNOSIS — E1165 Type 2 diabetes mellitus with hyperglycemia: Secondary | ICD-10-CM | POA: Diagnosis not present

## 2016-06-06 DIAGNOSIS — J449 Chronic obstructive pulmonary disease, unspecified: Secondary | ICD-10-CM | POA: Diagnosis not present

## 2016-06-06 DIAGNOSIS — R261 Paralytic gait: Secondary | ICD-10-CM | POA: Diagnosis not present

## 2016-06-06 DIAGNOSIS — K7581 Nonalcoholic steatohepatitis (NASH): Secondary | ICD-10-CM | POA: Diagnosis not present

## 2016-06-06 DIAGNOSIS — I129 Hypertensive chronic kidney disease with stage 1 through stage 4 chronic kidney disease, or unspecified chronic kidney disease: Secondary | ICD-10-CM | POA: Diagnosis not present

## 2016-06-06 DIAGNOSIS — J9621 Acute and chronic respiratory failure with hypoxia: Secondary | ICD-10-CM | POA: Diagnosis not present

## 2016-06-06 DIAGNOSIS — E1122 Type 2 diabetes mellitus with diabetic chronic kidney disease: Secondary | ICD-10-CM | POA: Diagnosis not present

## 2016-06-06 DIAGNOSIS — M6281 Muscle weakness (generalized): Secondary | ICD-10-CM | POA: Diagnosis not present

## 2016-06-06 DIAGNOSIS — N183 Chronic kidney disease, stage 3 (moderate): Secondary | ICD-10-CM | POA: Diagnosis not present

## 2016-06-06 DIAGNOSIS — Z7982 Long term (current) use of aspirin: Secondary | ICD-10-CM | POA: Diagnosis not present

## 2016-06-07 DIAGNOSIS — M5136 Other intervertebral disc degeneration, lumbar region: Secondary | ICD-10-CM | POA: Diagnosis not present

## 2016-06-10 ENCOUNTER — Encounter: Payer: Self-pay | Admitting: *Deleted

## 2016-06-11 ENCOUNTER — Emergency Department (HOSPITAL_COMMUNITY): Payer: PPO

## 2016-06-11 ENCOUNTER — Encounter (HOSPITAL_COMMUNITY): Payer: Self-pay

## 2016-06-11 ENCOUNTER — Emergency Department (HOSPITAL_COMMUNITY)
Admission: EM | Admit: 2016-06-11 | Discharge: 2016-06-12 | Disposition: A | Discharge status: Home / Self Care | Payer: PPO | Attending: Emergency Medicine | Admitting: Emergency Medicine

## 2016-06-11 DIAGNOSIS — E7220 Disorder of urea cycle metabolism, unspecified: Secondary | ICD-10-CM | POA: Insufficient documentation

## 2016-06-11 DIAGNOSIS — R1084 Generalized abdominal pain: Secondary | ICD-10-CM | POA: Insufficient documentation

## 2016-06-11 DIAGNOSIS — Z87891 Personal history of nicotine dependence: Secondary | ICD-10-CM | POA: Diagnosis not present

## 2016-06-11 DIAGNOSIS — R4182 Altered mental status, unspecified: Secondary | ICD-10-CM | POA: Diagnosis not present

## 2016-06-11 DIAGNOSIS — N183 Chronic kidney disease, stage 3 (moderate): Secondary | ICD-10-CM | POA: Insufficient documentation

## 2016-06-11 DIAGNOSIS — Z7982 Long term (current) use of aspirin: Secondary | ICD-10-CM | POA: Insufficient documentation

## 2016-06-11 DIAGNOSIS — Z96643 Presence of artificial hip joint, bilateral: Secondary | ICD-10-CM | POA: Insufficient documentation

## 2016-06-11 DIAGNOSIS — I129 Hypertensive chronic kidney disease with stage 1 through stage 4 chronic kidney disease, or unspecified chronic kidney disease: Secondary | ICD-10-CM | POA: Diagnosis not present

## 2016-06-11 DIAGNOSIS — J449 Chronic obstructive pulmonary disease, unspecified: Secondary | ICD-10-CM | POA: Diagnosis not present

## 2016-06-11 DIAGNOSIS — K760 Fatty (change of) liver, not elsewhere classified: Secondary | ICD-10-CM | POA: Diagnosis not present

## 2016-06-11 DIAGNOSIS — R7989 Other specified abnormal findings of blood chemistry: Secondary | ICD-10-CM

## 2016-06-11 DIAGNOSIS — E1122 Type 2 diabetes mellitus with diabetic chronic kidney disease: Secondary | ICD-10-CM | POA: Insufficient documentation

## 2016-06-11 DIAGNOSIS — E876 Hypokalemia: Secondary | ICD-10-CM | POA: Insufficient documentation

## 2016-06-11 LAB — COMPREHENSIVE METABOLIC PANEL
ALBUMIN: 3.1 g/dL — AB (ref 3.5–5.0)
ALT: 11 U/L — ABNORMAL LOW (ref 14–54)
ANION GAP: 11 (ref 5–15)
AST: 30 U/L (ref 15–41)
Alkaline Phosphatase: 94 U/L (ref 38–126)
BILIRUBIN TOTAL: 1.1 mg/dL (ref 0.3–1.2)
BUN: 15 mg/dL (ref 6–20)
CO2: 36 mmol/L — AB (ref 22–32)
Calcium: 8.8 mg/dL — ABNORMAL LOW (ref 8.9–10.3)
Chloride: 91 mmol/L — ABNORMAL LOW (ref 101–111)
Creatinine, Ser: 1.27 mg/dL — ABNORMAL HIGH (ref 0.44–1.00)
GFR calc Af Amer: 49 mL/min — ABNORMAL LOW (ref >60)
GFR calc non Af Amer: 42 mL/min — ABNORMAL LOW (ref >60)
GLUCOSE: 141 mg/dL — AB (ref 65–99)
POTASSIUM: 2.9 mmol/L — AB (ref 3.5–5.1)
SODIUM: 138 mmol/L (ref 135–145)
TOTAL PROTEIN: 5.7 g/dL — AB (ref 6.5–8.1)

## 2016-06-11 LAB — LIPASE, BLOOD: LIPASE: 40 U/L (ref 11–51)

## 2016-06-11 LAB — URINALYSIS, ROUTINE W REFLEX MICROSCOPIC
Bilirubin Urine: NEGATIVE
Glucose, UA: NEGATIVE mg/dL
HGB URINE DIPSTICK: NEGATIVE
Ketones, ur: NEGATIVE mg/dL
LEUKOCYTES UA: NEGATIVE
Nitrite: NEGATIVE
Protein, ur: NEGATIVE mg/dL
SPECIFIC GRAVITY, URINE: 1.01 (ref 1.005–1.030)
pH: 7.5 (ref 5.0–8.0)

## 2016-06-11 LAB — CBC
HEMATOCRIT: 33.5 % — AB (ref 36.0–46.0)
HEMOGLOBIN: 10.6 g/dL — AB (ref 12.0–15.0)
MCH: 30.8 pg (ref 26.0–34.0)
MCHC: 31.6 g/dL (ref 30.0–36.0)
MCV: 97.4 fL (ref 78.0–100.0)
Platelets: 132 10*3/uL — ABNORMAL LOW (ref 150–400)
RBC: 3.44 MIL/uL — AB (ref 3.87–5.11)
RDW: 15.4 % (ref 11.5–15.5)
WBC: 6.7 10*3/uL (ref 4.0–10.5)

## 2016-06-11 LAB — AMMONIA: AMMONIA: 65 umol/L — AB (ref 9–35)

## 2016-06-11 LAB — CBG MONITORING, ED: Glucose-Capillary: 128 mg/dL — ABNORMAL HIGH (ref 65–99)

## 2016-06-11 MED ORDER — LACTULOSE 10 GM/15ML PO SOLN
30.0000 g | Freq: Once | ORAL | Status: AC
  Administered 2016-06-12: 30 g via ORAL
  Filled 2016-06-11: qty 45

## 2016-06-11 MED ORDER — IOPAMIDOL (ISOVUE-300) INJECTION 61%
75.0000 mL | Freq: Once | INTRAVENOUS | Status: AC | PRN
  Administered 2016-06-11: 75 mL via INTRAVENOUS

## 2016-06-11 MED ORDER — POTASSIUM CHLORIDE CRYS ER 20 MEQ PO TBCR
40.0000 meq | EXTENDED_RELEASE_TABLET | Freq: Once | ORAL | Status: AC
  Administered 2016-06-12: 40 meq via ORAL
  Filled 2016-06-11: qty 2

## 2016-06-11 NOTE — ED Triage Notes (Signed)
Pt fell two weeks ago and is still having a lot of pain from that, she states that she can't walk

## 2016-06-11 NOTE — ED Triage Notes (Signed)
Pt's family member states that she's been confused and hallucinating for two days, she had pneumonia two weeks ago and has been treated for that. Pt states that she has intermittent headaches and feels lightheaded

## 2016-06-11 NOTE — ED Notes (Signed)
One IV start attempt

## 2016-06-11 NOTE — ED Notes (Signed)
Lab will be processing lipase as an add on

## 2016-06-11 NOTE — ED Notes (Signed)
CBG 128

## 2016-06-11 NOTE — ED Notes (Signed)
Returned from CT.

## 2016-06-11 NOTE — ED Provider Notes (Signed)
[facility] DEPT Provider Note   CSN: [id number] Arrival date & time: 06/11/16  1859 By signing my name below, I, Dyke Brackett, attest that this documentation has been prepared under the direction and in the presence of non-physician practitioner, Gay Filler, PA-C  Electronically Signed: Dyke Brackett, Scribe. 06/11/2016. 9:20 PM.   History   Chief Complaint Chief Complaint  Patient presents with  . Altered Mental Status   HPI XENIA NILE is a 70 y.o. female with hx of COPD on home O2, gastritis, hepatic cirrhosis, who presents to the Emergency Department complaining of gradually improving, intermittent altered mental status for two days. Pt's husband states that she has been experiencing hallucinations and confusion. He states she has experienced this before when her ammonia or potassium levels are abnormal. Pt and husband note associated intermittent headaches and lightheadednes. Pt reports chronic abdominal distension that has progressively worsened. Husband does report some variation in lactulose dosing last week. Pt was diagnosed with pneumonia two weeks ago and has been treated for this with improvement. Patient also reports low back pain s/p fall two weeks ago, describes the pain as muscle spasms. Ambulation and lying flat make the back pain worse. Pt is followed by Dr. Rolena Infante. Per pt husband, patient has had MRI, has fracture in lumbar spine and will likely need surgery. In review of medical records No loss of bowel or bladder function, no numbness, weakness.  No hx of cardiac failure or kidney disease. No fever, abdominal pain, nausea, vomiting, diarrhea, constipation, new SOB, chest pain, cough, numbness, weakness, dysuria, hematuria,  blurred vision, or LOC.   The history is provided by the patient and the spouse. No language interpreter was used.    Past Medical History:  Diagnosis Date  . COPD (chronic obstructive pulmonary disease) ([place])   . Esophageal varices ([place])     . Fibromyalgia   . Gastritis   . Hepatic cirrhosis ([place])   . Hepatic encephalopathy ([place])   . Hyperlipidemia   . Hypertension   . IBS (irritable bowel syndrome)   . Splenomegaly   . Vitamin D deficiency     Patient Active Problem List   Diagnosis Date Noted  . Atrial tachycardia, multifocal ([place])   . QT prolongation 03/05/2016  . Aspiration pneumonia ([place]) 01/24/2016  . Chronic respiratory failure with hypoxia ([place])   . COPD with exacerbation ([place])   . Acute renal failure superimposed on stage 3 chronic kidney disease ([place])   . Hyponatremia   . Acute on chronic respiratory failure ([place]) 01/19/2016  . DM type 2 causing CKD stage 3 ([place]) 10/30/2015  . Hepatic encephalopathy ([place]) 09/21/2015  . Hypokalemia 09/21/2015  . COPD GOLD II with min reversiblity  08/30/2015  . CKD stage 3 due to type 2 diabetes mellitus ([place]) 08/16/2015  . Chronic respiratory failure with hypoxia and hypercapnia ([place]) 08/07/2015  . Thrombocytopenia ([place]) 08/07/2015  . Venous insufficiency 07/26/2015  . Encounter for Medicare annual wellness exam 02/16/2015  . At high risk for falls 01/13/2015  . Obesity 09/21/2014  . Medication management 12/14/2013  . Essential hypertension   . Hyperlipidemia   . COPD (chronic obstructive pulmonary disease) ([place])   . GERD   . Vitamin D deficiency   . IBS   . Fibromyalgia   . Esophageal varices ([place]) 07/08/2012  . COLONIC POLYPS 11/11/2008  . Anemia 05/28/2008  . Hepatic cirrhosis ([place]) 05/28/2008  . Esophagitis 05/27/2008  . Diverticulosis of large intestine 05/27/2008    Past Surgical History:  Procedure Laterality Date  . ABDOMINAL HYSTERECTOMY    . BACK SURGERY    . CHOLECYSTECTOMY    . ESOPHAGOGASTRODUODENOSCOPY  07/16/2012   Procedure: ESOPHAGOGASTRODUODENOSCOPY (EGD);  Surgeon: Inda Castle, MD;  Location: [facility];  Service: Endoscopy;  Laterality: N/A;  . GASTRIC VARICES BANDING  07/16/2012   Procedure: GASTRIC VARICES BANDING;  Surgeon:  Inda Castle, MD;  Location: [facility] ENDOSCOPY;  Service: Endoscopy;  Laterality: N/A;  . HIP ARTHROPLASTY Bilateral   . NECK SURGERY    . SHOULDER SURGERY Right     OB History    No data available       Home Medications    Prior to Admission medications   Medication Sig Start Date End Date Taking? Authorizing Provider  allopurinol (ZYLOPRIM) 300 MG tablet take 1/2-1 tablet by mouth daily for gout Patient taking differently: TAKE 300 MG BY MOUTH DAILY 04/10/16  Yes Unk Pinto, MD  aspirin 81 MG tablet Take 81 mg by mouth at bedtime.    Yes Historical Provider, MD  bisoprolol (ZEBETA) 10 MG tablet Take 1 tablet (10 mg total) by mouth daily. 09/13/15  Yes Unk Pinto, MD  citalopram (CELEXA) 40 MG tablet Take 1 tablet (40 mg total) by mouth 2 (two) times daily. 02/29/16  Yes Vicie Mutters, PA-C  ferrous sulfate 325 (65 FE) MG tablet Take 325 mg by mouth daily with breakfast.   Yes Historical Provider, MD  furosemide (LASIX) 80 MG tablet Take 80 mg in the morning and one-half tablet (40 mg) in the evenings 03/06/16  Yes Vicie Mutters, PA-C  gabapentin (NEURONTIN) 300 MG capsule take 1 capsule by mouth in the morning, 1 capsule in the afternoon and 1-3 capsules at night Patient taking differently: take 1 capsule by mouth TID 05/30/16  Yes Vicie Mutters, PA-C  lactulose (CHRONULAC) 10 GM/15ML solution Take 30 ml (1 ounce) 2 x day Patient taking differently: Take 20 g by mouth 2 (two) times daily.  10/05/15 10/04/16 Yes Unk Pinto, MD  Magnesium 250 MG TABS Take 250 mg by mouth at bedtime.    Yes Historical Provider, MD  oxycodone (OXY-IR) 5 MG capsule Take 5 mg by mouth 3 (three) times daily.   Yes Historical Provider, MD  OXYGEN Inhale 3 L/min into the lungs daily as needed (On exertion and at home).    Yes Historical Provider, MD  pantoprazole (PROTONIX) 40 MG tablet Take 1 tablet (40 mg total) by mouth daily. 02/28/16 02/27/17 Yes Vicie Mutters, PA-C  potassium chloride (K-DUR) 10 MEQ  tablet Take 6 tablets (60 mEq total) by mouth daily. Patient taking differently: TAKE 20 MEQ BY MOUTH THREE TIMES DAILY 03/19/16  Yes Unk Pinto, MD  rifaximin (XIFAXAN) 550 MG TABS tablet Take 550 mg by mouth daily as needed.   Yes Historical Provider, MD  tamoxifen (NOLVADEX) 20 MG tablet Take 1 tablet (20 mg total) by mouth daily. 11/15/15  Yes Unk Pinto, MD  traMADol (ULTRAM) 50 MG tablet take 1 TABLET BY MOUTH EVERY 8 HOURS Patient taking differently: TAKE 50 MG BY MOUTH EVERY 8 HOURS AS NEEDED FOR PAIN 12/28/15  Yes Courtney Forcucci, PA-C  albuterol (PROVENTIL HFA;VENTOLIN HFA) 108 (90 Base) MCG/ACT inhaler Inhale 2 puffs into the lungs every 6 (six) hours as needed for wheezing or shortness of breath. 05/27/16   Kristen N Ward, DO  albuterol (PROVENTIL) (2.5 MG/3ML) 0.083% nebulizer solution Take 2.5 mg by nebulization every 4 (four) hours as needed for wheezing or shortness of breath.  04/11/16   Historical Provider, MD  ALPRAZolam Duanne Moron) 1 MG tablet Take 1 mg by mouth at bedtime as needed for anxiety.    Historical Provider, MD  benzonatate (TESSALON) 100 MG capsule Take by mouth daily as needed for cough.    Historical Provider, MD  cyclobenzaprine (FLEXERIL) 10 MG tablet Take 1 tablet (10 mg total) by mouth 3 (three) times daily as needed for muscle spasms. 03/06/16   Unk Pinto, MD  ipratropium (ATROVENT) 0.02 % nebulizer solution use 1 vial in nebulizer EVERY 4 HOURS AS NEEDED FOR WHEEZING OR SHORTNESS OF BREATH 05/31/16   Vicie Mutters, PA-C  ondansetron (ZOFRAN) 4 MG tablet Take 1 tablet (4 mg total) by mouth daily as needed for nausea or vomiting. 10/06/15 10/05/16  Vicie Mutters, PA-C  oxyCODONE (OXY IR/ROXICODONE) 5 MG immediate release tablet Take 5 mg by mouth every 4 (four) hours as needed for severe pain.    Historical Provider, MD  triamcinolone cream (KENALOG) 0.1 % Apply 1 application topically 3 (three) times daily. 03/22/16   Historical Provider, MD    Family  History Family History  Problem Relation Age of Onset  . Diabetes Brother     deceased  . Heart disease Sister     A Fib  . Heart disease Mother     CHF  . Atrial fibrillation Sister   . Colon cancer Neg Hx     Social History Social History  Substance Use Topics  . Smoking status: Former Smoker    Packs/day: 1.00    Years: 50.00    Types: Cigarettes    Quit date: 11/05/2014  . Smokeless tobacco: Never Used     Comment: Quit April 2016  . Alcohol use No     Allergies   Atorvastatin; Diphenhydramine hcl (sleep); Hydrocodone-acetaminophen; Lopid [gemfibrozil]; Loratadine; Lorazepam; Simvastatin; Sulfamethoxazole; and Sulfonamide derivatives   Review of Systems Review of Systems  Constitutional: Negative for chills, diaphoresis and fever.  HENT: Negative for trouble swallowing.   Eyes: Negative for visual disturbance.  Respiratory: Negative for cough and shortness of breath.   Cardiovascular: Negative for chest pain.  Gastrointestinal: Positive for abdominal distention. Negative for abdominal pain, blood in stool, constipation, diarrhea, nausea and vomiting.  Genitourinary: Negative for dysuria and hematuria.  Musculoskeletal: Positive for back pain. Negative for neck pain.  Skin: Negative for rash.  Neurological: Positive for light-headedness and headaches ( intermittent, none currnetly). Negative for syncope, weakness and numbness.  Psychiatric/Behavioral: Positive for confusion ( resolving) and hallucinations ( resolving).    Physical Exam Updated Vital Signs BP 120/61   Pulse (!) 59   Temp 98.3 F (36.8 C) (Oral)   Resp 16   Ht 5' 2.5" (1.588 m)   Wt 79.8 kg   SpO2 94%   BMI 31.68 kg/m   Physical Exam  Constitutional: She appears well-developed and well-nourished. No distress.  HENT:  Head: Normocephalic and atraumatic.  Mouth/Throat: Oropharynx is clear and moist. No oropharyngeal exudate.  Eyes: Conjunctivae and EOM are normal. Pupils are equal, round,  and reactive to light. Right eye exhibits no discharge. Left eye exhibits no discharge. No scleral icterus.  Neck: Normal range of motion and phonation normal. Neck supple. No neck rigidity. Normal range of motion present.  Cardiovascular: Normal rate, regular rhythm, normal heart sounds and intact distal pulses.   No murmur heard. Pulmonary/Chest: Effort normal and breath sounds normal. No stridor. No respiratory distress. She has no wheezes. She has no rales.  Abdominal: Soft. Bowel sounds  are normal. She exhibits distension. There is tenderness (diffusely ). There is no rigidity, no rebound, no guarding and no CVA tenderness.  Abdomen is mildly distended. Remains soft, diffusely tender.   Musculoskeletal: Normal range of motion. She exhibits edema (2 + pitting edema).  B/l lower extremity 1-2+ pitting edema with chronic skin changes.   Lymphadenopathy:    She has no cervical adenopathy.  Neurological: She is alert. She has normal strength. She is not disoriented. No cranial nerve deficit ( II-XII grossly normal) or sensory deficit. Coordination normal. GCS eye subscore is 4. GCS verbal subscore is 5. GCS motor subscore is 6.  Skin: Skin is warm and dry. She is not diaphoretic.  Psychiatric: She has a normal mood and affect. Her behavior is normal.    ED Treatments / Results  DIAGNOSTIC STUDIES:  Oxygen Saturation is 100% on RA, normal by my interpretation.    COORDINATION OF CARE:  9:15 PM Discussed treatment plan with pt at bedside and pt agreed to plan.  Labs (all labs ordered are listed, but only abnormal results are displayed) Labs Reviewed  COMPREHENSIVE METABOLIC PANEL - Abnormal; Notable for the following:       Result Value   Potassium 2.9 (*)    Chloride 91 (*)    CO2 36 (*)    Glucose, Bld 141 (*)    Creatinine, Ser 1.27 (*)    Calcium 8.8 (*)    Total Protein 5.7 (*)    Albumin 3.1 (*)    ALT 11 (*)    GFR calc non Af Amer 42 (*)    GFR calc Af Amer 49 (*)    All  other components within normal limits  CBC - Abnormal; Notable for the following:    RBC 3.44 (*)    Hemoglobin 10.6 (*)    HCT 33.5 (*)    Platelets 132 (*)    All other components within normal limits  URINALYSIS, ROUTINE W REFLEX MICROSCOPIC (NOT AT [facility]) - Abnormal; Notable for the following:    APPearance CLOUDY (*)    All other components within normal limits  AMMONIA - Abnormal; Notable for the following:    Ammonia 65 (*)    All other components within normal limits  CBG MONITORING, ED - Abnormal; Notable for the following:    Glucose-Capillary 128 (*)    All other components within normal limits  LIPASE, BLOOD    EKG  EKG Interpretation  Date/Time:  Monday June 11 2016 20:36:40 EST Ventricular Rate:  54 PR Interval:    QRS Duration: 140 QT Interval:  561 QTC Calculation: 532 R Axis:   69 Text Interpretation:  Sinus rhythm Right bundle branch block Confirmed by YELVERTON  MD, DAVID ([id number]) on 06/12/2016 5:18:09 PM       Radiology Ct Abdomen Pelvis W Contrast  Result Date: 06/11/2016 CLINICAL DATA:  Diffuse abdominal pain EXAM: CT ABDOMEN AND PELVIS WITH CONTRAST TECHNIQUE: Multidetector CT imaging of the abdomen and pelvis was performed using the standard protocol following bolus administration of intravenous contrast. CONTRAST:  77m ISOVUE-300 IOPAMIDOL (ISOVUE-300) INJECTION 61% COMPARISON:  Abdominal CT 07/01/2012 FINDINGS: Lower chest: There is right middle lobe and lingular atelectasis, incompletely visualized. There is a small left pleural effusion. No pulmonary nodules or masses are seen. Hepatobiliary: The left hepatic lobe is enlarged. There is a diffusely nodular liver contour. The gallbladder is surgically absent. There is no biliary dilatation. No discrete hepatic lesions. No ascites. Pancreas: Normal pancreatic contours and enhancement.  No peripancreatic fluid collection or pancreatic ductal dilatation. Spleen: Spleen is enlarged, measuring 15.6 cm in  craniocaudal dimension. No focal internal abnormality. Adrenals/Urinary Tract: Normal adrenal glands. Right renal calcification measures 5.3 mm. There are multiple bilateral renal cysts, which measure up to 2 cm. No hydronephrosis. Stomach/Bowel: No abnormal bowel dilatation. No bowel wall thickening or adjacent fat stranding to indicate acute inflammation. No abdominal fluid collection. Despite review of multiplanar reformatted images in 3 orthogonal planes, the appendix could not be adequately visualized. However, there is no inflammatory stranding or free fluid within the right lower quadrant. Vascular/Lymphatic: There is atherosclerotic calcification of the non aneurysmal abdominal aorta. The portal vein, superior mesenteric vein and splenic vein are patent. No abdominal or pelvic adenopathy. There are perisplenic varices. Reproductive: Visualization of the pelvic organs is limited by streak artifact from bilateral hip prostheses. Musculoskeletal: L4-S1 posterior spinal fusion without focal hardware abnormality. Bilateral hip prostheses. No advanced bony spinal canal stenosis. Multiple right posterior healed rib fractures. Normal visualized extrathoracic and extraperitoneal soft tissues. Other: Mild mesenteric edema. IMPRESSION: 1. Hepatic cirrhosis and findings of portal hypertension, including splenomegaly and perisplenic varices. 2. Right nephrolithiasis with unchanged 5 mm peripheral right-sided renal calculus. 3. Small pleural effusion. Electronically Signed   By: Ulyses Jarred M.D.   On: 06/11/2016 23:18    Procedures Procedures (including critical care time)  Medications Ordered in ED Medications  iopamidol (ISOVUE-300) 61 % injection 75 mL (75 mLs Intravenous Contrast Given 06/11/16 2245)  potassium chloride SA (K-DUR,KLOR-CON) CR tablet 40 mEq (40 mEq Oral Given 06/12/16 0012)  lactulose (CHRONULAC) 10 GM/15ML solution 30 g (30 g Oral Given 06/12/16 0016)  oxyCODONE (Oxy IR/ROXICODONE) immediate  release tablet 5 mg (5 mg Oral Given 06/12/16 0010)     Initial Impression / Assessment and Plan / ED Course  I have reviewed the triage vital signs and the nursing notes.  Pertinent labs & imaging results that were available during my care of the patient were reviewed by me and considered in my medical decision making (see chart for details).  Clinical Course as of Jun 12 2328  Mon Jun 11, 2016  2325 Reviewed CT Abdomen Pelvis W Contrast [AM]    Clinical Course User Index [AM] Roxanna Mew, PA-C    Patient presents to ED with complaint of AMS that is resolving. Pt reports when ammonia or potassium are abnormal patient beings hallucinating, reports onset approximately 2 days ago. He reports she is improving. Patient is afebrile and non-toxic appearing in NAD. VSS. Physical remarkable for abdominal distension and diffuse abdominal tenderness. Patient is alert and answering questions and following commands. No focal neuro deficits on exam. Will check basic labs. Given h/o cirrhosis will check ammonia. With abdominal pain will CT abd/pelvis to r/o intra-abdominal pathology. Discussed patient with Dr. Regenia Skeeter, who also saw patient, agrees with plan.   Hemoglobin and plts stable. No leukocytosis. U/A shows no evidence of infection. Lipase nml, doubt pancreatitis. Potassium low at 2.9; EKG shows sinus rhythm with RBBB, review of previous EKG shows RBBB at that time as well; prolonged QT; pt has h/o of prolonged QT. Pt has h/o hypokalemia likely secondary to medication - on loop diuretic and lactulose increases stooling, pt on home potassium. PO potassium given in ED. Mild bump in creatinine, encourage hydration. Ammonia slightly elevated at 65. CT abd/pelvis shows change liver cirrhosis with evidence of portal hypertension, unchanged nephrolithiasis, and small left pleural effusion. Suspect AMS secondary to elevated ammonia. Lactulose ordered. Patient is  afebrile, non-toxic appearing in NAD and  hemodynamically stable. AMS resolving. Will consult hospitalist regarding dispo.   Spoke with [facility], greatly appreciate their time and input. Considering resolving AMS and stable vital signs d/c home with increased lactulose dose to 33m TID and close follow up with PCP.  Discussed results and plan with patient. Take lactulose 239mTID. Call PCP in am for follow up appointment and recheck ammonia. Stay well hydrated.  Take home medications as prescribed. Return precautions given. Pt and husband voiced understanding and are agreeable.   Final Clinical Impressions(s) / ED Diagnoses   Final diagnoses:  Hypokalemia  Increased ammonia level    New Prescriptions Discharge Medication List as of 06/12/2016  1:02 AM    I personally performed the services described in this documentation, which was scribed in my presence. The recorded information has been reviewed and is accurate.    AsRoxanna MewPA-C 06/13/16 [id number]  ScSherwood GamblerMD 06/13/16 2229

## 2016-06-12 DIAGNOSIS — S32010A Wedge compression fracture of first lumbar vertebra, initial encounter for closed fracture: Secondary | ICD-10-CM | POA: Diagnosis not present

## 2016-06-12 MED ORDER — OXYCODONE HCL 5 MG PO TABS
5.0000 mg | ORAL_TABLET | Freq: Once | ORAL | Status: AC
  Administered 2016-06-12: 5 mg via ORAL
  Filled 2016-06-12: qty 1

## 2016-06-12 NOTE — Discharge Instructions (Signed)
Read the information below.  Your ammonia level is elevated slightly. Your potassium was mildly low. Imaging of your abdomen did not show any acute abdominal pathology.   Increase your lactulose to 75m three times daily. Please call your primary doctor in the morning to be seen and re-evaluated.  Continue to take your potassium medication as prescribed. Be sure to stay well hydrated and drink plenty of fluids.  Use the prescribed medication as directed.  Please discuss all new medications with your pharmacist.   You may return to the Emergency Department at any time for worsening condition or any new symptoms that concern you. Return if develop fever, confusion, chest pain, shortness of breath, worsening abdominal pain, inability to keep food/fluids down, or any other new/concerning symptoms.

## 2016-06-12 NOTE — ED Notes (Signed)
ED Provider at bedside. 

## 2016-06-18 ENCOUNTER — Other Ambulatory Visit: Payer: Self-pay | Admitting: *Deleted

## 2016-06-18 MED ORDER — TERCONAZOLE 0.4 % VA CREA: 1.0000 | TOPICAL_CREAM | Freq: Every day | VAGINAL | 0 refills | Status: DC

## 2016-06-19 DIAGNOSIS — M5136 Other intervertebral disc degeneration, lumbar region: Secondary | ICD-10-CM | POA: Diagnosis not present

## 2016-06-19 DIAGNOSIS — S32010A Wedge compression fracture of first lumbar vertebra, initial encounter for closed fracture: Secondary | ICD-10-CM | POA: Diagnosis not present

## 2016-06-19 DIAGNOSIS — J449 Chronic obstructive pulmonary disease, unspecified: Secondary | ICD-10-CM | POA: Diagnosis not present

## 2016-06-19 DIAGNOSIS — M961 Postlaminectomy syndrome, not elsewhere classified: Secondary | ICD-10-CM | POA: Diagnosis not present

## 2016-06-21 ENCOUNTER — Other Ambulatory Visit (HOSPITAL_COMMUNITY): Payer: Self-pay | Admitting: Interventional Radiology

## 2016-06-21 DIAGNOSIS — IMO0001 Reserved for inherently not codable concepts without codable children: Secondary | ICD-10-CM

## 2016-06-21 DIAGNOSIS — M4850XS Collapsed vertebra, not elsewhere classified, site unspecified, sequela of fracture: Principal | ICD-10-CM

## 2016-06-25 ENCOUNTER — Ambulatory Visit (HOSPITAL_COMMUNITY)
Admission: RE | Admit: 2016-06-25 | Discharge: 2016-06-25 | Disposition: A | Discharge status: Home / Self Care | Payer: PPO | Source: Ambulatory Visit | Attending: Interventional Radiology | Admitting: Interventional Radiology

## 2016-06-25 ENCOUNTER — Other Ambulatory Visit: Payer: Self-pay | Admitting: Internal Medicine

## 2016-06-25 DIAGNOSIS — S32010A Wedge compression fracture of first lumbar vertebra, initial encounter for closed fracture: Secondary | ICD-10-CM | POA: Diagnosis not present

## 2016-06-25 DIAGNOSIS — IMO0001 Reserved for inherently not codable concepts without codable children: Secondary | ICD-10-CM

## 2016-06-25 DIAGNOSIS — M4850XS Collapsed vertebra, not elsewhere classified, site unspecified, sequela of fracture: Principal | ICD-10-CM

## 2016-06-25 HISTORY — PX: IR GENERIC HISTORICAL: IMG1180011

## 2016-06-26 ENCOUNTER — Other Ambulatory Visit (HOSPITAL_COMMUNITY): Payer: Self-pay | Admitting: Interventional Radiology

## 2016-06-26 ENCOUNTER — Other Ambulatory Visit: Payer: Self-pay | Admitting: Radiology

## 2016-06-26 ENCOUNTER — Other Ambulatory Visit: Payer: Self-pay | Admitting: Internal Medicine

## 2016-06-26 ENCOUNTER — Encounter (HOSPITAL_COMMUNITY): Payer: Self-pay | Admitting: Interventional Radiology

## 2016-06-26 ENCOUNTER — Ambulatory Visit (INDEPENDENT_AMBULATORY_CARE_PROVIDER_SITE_OTHER): Payer: PPO | Admitting: Physician Assistant

## 2016-06-26 VITALS — BP 114/68 | HR 56 | Temp 98.4°F | Resp 16 | Ht 62.0 in | Wt 176.0 lb

## 2016-06-26 DIAGNOSIS — M79662 Pain in left lower leg: Secondary | ICD-10-CM

## 2016-06-26 DIAGNOSIS — M7989 Other specified soft tissue disorders: Secondary | ICD-10-CM | POA: Diagnosis not present

## 2016-06-26 DIAGNOSIS — J42 Unspecified chronic bronchitis: Secondary | ICD-10-CM | POA: Diagnosis not present

## 2016-06-26 DIAGNOSIS — N183 Chronic kidney disease, stage 3 (moderate): Secondary | ICD-10-CM | POA: Diagnosis not present

## 2016-06-26 DIAGNOSIS — IMO0001 Reserved for inherently not codable concepts without codable children: Secondary | ICD-10-CM

## 2016-06-26 DIAGNOSIS — I10 Essential (primary) hypertension: Secondary | ICD-10-CM

## 2016-06-26 DIAGNOSIS — K7682 Hepatic encephalopathy: Secondary | ICD-10-CM

## 2016-06-26 DIAGNOSIS — E1122 Type 2 diabetes mellitus with diabetic chronic kidney disease: Secondary | ICD-10-CM

## 2016-06-26 DIAGNOSIS — K7290 Hepatic failure, unspecified without coma: Secondary | ICD-10-CM | POA: Diagnosis not present

## 2016-06-26 DIAGNOSIS — I8510 Secondary esophageal varices without bleeding: Secondary | ICD-10-CM | POA: Diagnosis not present

## 2016-06-26 DIAGNOSIS — M4850XS Collapsed vertebra, not elsewhere classified, site unspecified, sequela of fracture: Principal | ICD-10-CM

## 2016-06-26 DIAGNOSIS — K7460 Unspecified cirrhosis of liver: Secondary | ICD-10-CM | POA: Diagnosis not present

## 2016-06-26 DIAGNOSIS — R188 Other ascites: Secondary | ICD-10-CM

## 2016-06-26 LAB — HEPATIC FUNCTION PANEL
ALK PHOS: 135 U/L — AB (ref 33–130)
ALT: 12 U/L (ref 6–29)
AST: 29 U/L (ref 10–35)
Albumin: 3.1 g/dL — ABNORMAL LOW (ref 3.6–5.1)
BILIRUBIN DIRECT: 0.3 mg/dL — AB (ref <=0.2)
BILIRUBIN INDIRECT: 0.7 mg/dL (ref 0.2–1.2)
Total Bilirubin: 1 mg/dL (ref 0.2–1.2)
Total Protein: 5.3 g/dL — ABNORMAL LOW (ref 6.1–8.1)

## 2016-06-26 LAB — CBC WITH DIFFERENTIAL/PLATELET
BASOS ABS: 45 {cells}/uL (ref 0–200)
Basophils Relative: 1 %
EOS ABS: 360 {cells}/uL (ref 15–500)
Eosinophils Relative: 8 %
HEMATOCRIT: 33.4 % — AB (ref 35.0–45.0)
HEMOGLOBIN: 10.5 g/dL — AB (ref 11.7–15.5)
LYMPHS PCT: 23 %
Lymphs Abs: 1035 cells/uL (ref 850–3900)
MCH: 30.9 pg (ref 27.0–33.0)
MCHC: 31.4 g/dL — AB (ref 32.0–36.0)
MCV: 98.2 fL (ref 80.0–100.0)
MONO ABS: 270 {cells}/uL (ref 200–950)
MPV: 11.7 fL (ref 7.5–12.5)
Monocytes Relative: 6 %
NEUTROS PCT: 62 %
Neutro Abs: 2790 cells/uL (ref 1500–7800)
Platelets: 92 10*3/uL — ABNORMAL LOW (ref 140–400)
RBC: 3.4 MIL/uL — ABNORMAL LOW (ref 3.80–5.10)
RDW: 16.1 % — AB (ref 11.0–15.0)
WBC: 4.5 10*3/uL (ref 3.8–10.8)

## 2016-06-26 LAB — BASIC METABOLIC PANEL WITH GFR
BUN: 14 mg/dL (ref 7–25)
CHLORIDE: 103 mmol/L (ref 98–110)
CO2: 34 mmol/L — AB (ref 20–31)
CREATININE: 1.26 mg/dL — AB (ref 0.50–0.99)
Calcium: 8.5 mg/dL — ABNORMAL LOW (ref 8.6–10.4)
GFR, Est African American: 50 mL/min — ABNORMAL LOW (ref >=60)
GFR, Est Non African American: 44 mL/min — ABNORMAL LOW (ref >=60)
Glucose, Bld: 97 mg/dL (ref 65–99)
POTASSIUM: 3.7 mmol/L (ref 3.5–5.3)
SODIUM: 143 mmol/L (ref 135–146)

## 2016-06-26 LAB — AMMONIA: Ammonia: 51 umol/L — ABNORMAL HIGH (ref <=47)

## 2016-06-26 NOTE — Progress Notes (Signed)
Subjective:    Patient ID: [name], female    DOB: Jun 09, 1946, 70 y.o.   MRN: [id number]  HPI 70 y.o. WF with complicated history of COPD on home O2, DM2 with CKD stage 3, liver cirrhosis with esophageal varices and thrombocytopenia presents for follow up after an ER visit for AMS. She was found to have an elevated ammonia level, and her lactulose was increased to 17m TID. She is on potassium 6 pills a day.  She has history of compression fracture and is following with Dr. BRolena Infante she will have surgery/cementing on Wednesday, she is on oxycodone from Dr. RNelva Bush She has had left leg swelling x several months, some pain in it, no redness/warmth.  Her weight is stable, still with swelling bilateral legs, wears compression stockings. .  Wt Readings from Last 3 Encounters:  06/11/16 176 lb (79.8 kg)  05/30/16 177 lb (80.3 kg)  05/27/16 180 lb (81.6 kg)   Last Ammonia was 65.   Lab Results  Component Value Date   CREATININE 1.27 (H) 06/11/2016   BUN 15 06/11/2016   NA 138 06/11/2016   K 2.9 (L) 06/11/2016   CL 91 (L) 06/11/2016   CO2 36 (H) 06/11/2016    There were no vitals taken for this visit.  Medications Current Outpatient Prescriptions on File Prior to Visit  Medication Sig  . albuterol (PROVENTIL HFA;VENTOLIN HFA) 108 (90 Base) MCG/ACT inhaler Inhale 2 puffs into the lungs every 6 (six) hours as needed for wheezing or shortness of breath.  .Marland Kitchenalbuterol (PROVENTIL) (2.5 MG/3ML) 0.083% nebulizer solution Take 2.5 mg by nebulization every 4 (four) hours as needed for wheezing or shortness of breath.   . allopurinol (ZYLOPRIM) 300 MG tablet take 1/2-1 tablet by mouth daily for gout (Patient taking differently: TAKE 300 MG BY MOUTH DAILY)  . ALPRAZolam (XANAX) 1 MG tablet Take 1 mg by mouth at bedtime as needed for anxiety.  .Marland Kitchenaspirin 81 MG tablet Take 81 mg by mouth at bedtime.   . benzonatate (TESSALON) 100 MG capsule Take by mouth daily as needed for cough.  . bisoprolol  (ZEBETA) 10 MG tablet Take 1 tablet (10 mg total) by mouth daily.  . citalopram (CELEXA) 40 MG tablet Take 1 tablet (40 mg total) by mouth 2 (two) times daily.  . cyclobenzaprine (FLEXERIL) 10 MG tablet Take 1 tablet (10 mg total) by mouth 3 (three) times daily as needed for muscle spasms.  . ferrous sulfate 325 (65 FE) MG tablet Take 325 mg by mouth daily with breakfast.  . furosemide (LASIX) 80 MG tablet Take 80 mg in the morning and one-half tablet (40 mg) in the evenings  . gabapentin (NEURONTIN) 300 MG capsule take 1 capsule by mouth in the morning, 1 capsule in the afternoon and 1-3 capsules at night (Patient taking differently: take 1 capsule by mouth TID)  . ipratropium (ATROVENT) 0.02 % nebulizer solution use 1 vial in nebulizer EVERY 4 HOURS AS NEEDED FOR WHEEZING OR SHORTNESS OF BREATH  . lactulose (CHRONULAC) 10 GM/15ML solution Take 30 ml (1 ounce) 2 x day (Patient taking differently: Take 20 g by mouth 2 (two) times daily. )  . Magnesium 250 MG TABS Take 250 mg by mouth at bedtime.   . ondansetron (ZOFRAN) 4 MG tablet Take 1 tablet (4 mg total) by mouth daily as needed for nausea or vomiting.  .Marland KitchenoxyCODONE (OXY IR/ROXICODONE) 5 MG immediate release tablet Take 5 mg by mouth every 4 (four) hours  as needed for severe pain.  Marland Kitchen oxycodone (OXY-IR) 5 MG capsule Take 5 mg by mouth 3 (three) times daily.  . OXYGEN Inhale 3 L/min into the lungs daily as needed (On exertion and at home).   . pantoprazole (PROTONIX) 40 MG tablet Take 1 tablet (40 mg total) by mouth daily.  . pantoprazole (PROTONIX) 40 MG tablet Take 1 tablet (40 mg total) by mouth 2 (two) times daily.  . potassium chloride (K-DUR) 10 MEQ tablet Take 6 tablets (60 mEq total) by mouth daily. (Patient taking differently: TAKE 20 MEQ BY MOUTH THREE TIMES DAILY)  . rifaximin (XIFAXAN) 550 MG TABS tablet Take 550 mg by mouth daily as needed.  . tamoxifen (NOLVADEX) 20 MG tablet Take 1 tablet (20 mg total) by mouth daily.  Marland Kitchen terconazole  (TERAZOL 7) 0.4 % vaginal cream Place 1 applicator vaginally at bedtime.  . traMADol (ULTRAM) 50 MG tablet take 1 TABLET BY MOUTH EVERY 8 HOURS (Patient taking differently: TAKE 50 MG BY MOUTH EVERY 8 HOURS AS NEEDED FOR PAIN)  . triamcinolone cream (KENALOG) 0.1 % Apply 1 application topically 3 (three) times daily.   No current facility-administered medications on file prior to visit.     Problem list She has COLONIC POLYPS; Anemia; Esophagitis; Diverticulosis of large intestine; Hepatic cirrhosis ([place]); Esophageal varices ([place]); Essential hypertension; Hyperlipidemia; COPD (chronic obstructive pulmonary disease) ([place]); GERD; Vitamin D deficiency; IBS; Fibromyalgia; Medication management; Obesity; At high risk for falls; Encounter for Medicare annual wellness exam; Venous insufficiency; Chronic respiratory failure with hypoxia and hypercapnia ([place]); Thrombocytopenia ([place]); CKD stage 3 due to type 2 diabetes mellitus ([place]); COPD GOLD II with min reversiblity ; Hepatic encephalopathy ([place]); Hypokalemia; DM type 2 causing CKD stage 3 ([place]); Acute on chronic respiratory failure ([place]); Chronic respiratory failure with hypoxia ([place]); COPD with exacerbation ([place]); Acute renal failure superimposed on stage 3 chronic kidney disease ([place]); Hyponatremia; Aspiration pneumonia ([place]); QT prolongation; and Atrial tachycardia, multifocal (HCC) on her problem list.   Review of Systems  Constitutional: Negative for chills, fatigue and fever.  Respiratory: Positive for shortness of breath. Negative for cough, chest tightness and wheezing.   Gastrointestinal: Positive for abdominal pain, diarrhea and nausea. Negative for anal bleeding, blood in stool, constipation and vomiting.  Genitourinary: Negative for difficulty urinating, dysuria, hematuria and urgency.       Objective:   Physical Exam  Constitutional: She is oriented to person, place, and time. She appears well-developed and well-nourished. No distress.  Nasal cannula in place.  HENT:  Head: Normocephalic.  Mouth/Throat: Oropharynx is clear and moist. No oropharyngeal exudate.  Moon facies  Eyes: Conjunctivae are normal. No scleral icterus.  Neck: Normal range of motion. Neck supple. No JVD present. No thyromegaly present.  Cardiovascular: Normal rate, regular rhythm, normal heart sounds and intact distal pulses.  Exam reveals no gallop and no friction rub.   No murmur heard. Pulmonary/Chest: Effort normal and breath sounds normal. No respiratory distress. She has no wheezes. She has no rales. She exhibits no tenderness.  Abdominal: Soft. Bowel sounds are normal. She exhibits ascites. She exhibits no distension, no fluid wave and no mass. There is generalized tenderness. There is no rigidity, no rebound, no guarding, no tenderness at McBurney's point and negative Murphy's sign.  Abdomen tight and distended.  Musculoskeletal: Normal range of motion. She exhibits tenderness (appears uncomfortable in the chair due to back pain).  Patient in a wheelchair  Lymphadenopathy:    She has no cervical adenopathy.  Neurological:  She is alert and oriented to person, place, and time.  Skin: Skin is warm and dry. She is not diaphoretic.  Psychiatric: She has a normal mood and affect. Her behavior is normal. Judgment and thought content normal.  Nursing note and vitals reviewed.     Assessment & Plan:  COPD  Monitor breathing, continue O2  Cirrhosis of liver with ascites, unspecified hepatic cirrhosis type ([place]) - Hepatic function panel - Ammonia - check potassium - continue lasix  Secondary esophageal varices without bleeding (HCC) Check CBC  CKD stage 3 due to type 2 diabetes mellitus ([place])  avoid NSAIDS, monitor sugars, will monitor - BASIC METABOLIC PANEL WITH GFR - Magnesium   Left leg swelling On tamoxifen, immobile, check [place] left leg Go to ER if any worsening SOB/CP   Future Appointments Date Time Provider [place]   06/26/2016 11:30 AM Vicie Mutters, PA-C [facility] None  07/27/2016 11:45 AM Unk Pinto, MD [facility] None  01/21/2017 10:00 AM Vicie Mutters, PA-C [facility] None

## 2016-06-27 ENCOUNTER — Other Ambulatory Visit (HOSPITAL_COMMUNITY): Payer: Self-pay | Admitting: Interventional Radiology

## 2016-06-27 ENCOUNTER — Encounter (HOSPITAL_COMMUNITY): Payer: Self-pay

## 2016-06-27 ENCOUNTER — Ambulatory Visit (HOSPITAL_COMMUNITY)
Admission: RE | Admit: 2016-06-27 | Discharge: 2016-06-27 | Disposition: A | Discharge status: Home / Self Care | Payer: PPO | Source: Ambulatory Visit | Attending: Physician Assistant | Admitting: Physician Assistant

## 2016-06-27 ENCOUNTER — Ambulatory Visit (HOSPITAL_COMMUNITY)
Admission: RE | Admit: 2016-06-27 | Discharge: 2016-06-27 | Disposition: A | Discharge status: Home / Self Care | Payer: PPO | Source: Ambulatory Visit | Attending: Interventional Radiology | Admitting: Interventional Radiology

## 2016-06-27 DIAGNOSIS — S32019A Unspecified fracture of first lumbar vertebra, initial encounter for closed fracture: Secondary | ICD-10-CM | POA: Diagnosis not present

## 2016-06-27 DIAGNOSIS — M4850XS Collapsed vertebra, not elsewhere classified, site unspecified, sequela of fracture: Principal | ICD-10-CM

## 2016-06-27 DIAGNOSIS — M7989 Other specified soft tissue disorders: Principal | ICD-10-CM

## 2016-06-27 DIAGNOSIS — M545 Low back pain: Secondary | ICD-10-CM | POA: Diagnosis not present

## 2016-06-27 DIAGNOSIS — E559 Vitamin D deficiency, unspecified: Secondary | ICD-10-CM | POA: Insufficient documentation

## 2016-06-27 DIAGNOSIS — Z79899 Other long term (current) drug therapy: Secondary | ICD-10-CM | POA: Diagnosis not present

## 2016-06-27 DIAGNOSIS — K7460 Unspecified cirrhosis of liver: Secondary | ICD-10-CM | POA: Insufficient documentation

## 2016-06-27 DIAGNOSIS — I10 Essential (primary) hypertension: Secondary | ICD-10-CM | POA: Insufficient documentation

## 2016-06-27 DIAGNOSIS — Z833 Family history of diabetes mellitus: Secondary | ICD-10-CM | POA: Insufficient documentation

## 2016-06-27 DIAGNOSIS — J449 Chronic obstructive pulmonary disease, unspecified: Secondary | ICD-10-CM | POA: Insufficient documentation

## 2016-06-27 DIAGNOSIS — W19XXXA Unspecified fall, initial encounter: Secondary | ICD-10-CM | POA: Diagnosis not present

## 2016-06-27 DIAGNOSIS — IMO0001 Reserved for inherently not codable concepts without codable children: Secondary | ICD-10-CM

## 2016-06-27 DIAGNOSIS — Z8249 Family history of ischemic heart disease and other diseases of the circulatory system: Secondary | ICD-10-CM | POA: Diagnosis not present

## 2016-06-27 DIAGNOSIS — M4856XA Collapsed vertebra, not elsewhere classified, lumbar region, initial encounter for fracture: Secondary | ICD-10-CM | POA: Diagnosis not present

## 2016-06-27 DIAGNOSIS — Z87891 Personal history of nicotine dependence: Secondary | ICD-10-CM | POA: Insufficient documentation

## 2016-06-27 DIAGNOSIS — E785 Hyperlipidemia, unspecified: Secondary | ICD-10-CM | POA: Insufficient documentation

## 2016-06-27 DIAGNOSIS — M79662 Pain in left lower leg: Secondary | ICD-10-CM

## 2016-06-27 HISTORY — PX: IR GENERIC HISTORICAL: IMG1180011

## 2016-06-27 LAB — BASIC METABOLIC PANEL
ANION GAP: 6 (ref 5–15)
BUN: 11 mg/dL (ref 6–20)
CHLORIDE: 104 mmol/L (ref 101–111)
CO2: 33 mmol/L — ABNORMAL HIGH (ref 22–32)
Calcium: 8.9 mg/dL (ref 8.9–10.3)
Creatinine, Ser: 1.29 mg/dL — ABNORMAL HIGH (ref 0.44–1.00)
GFR calc non Af Amer: 41 mL/min — ABNORMAL LOW (ref >60)
GFR, EST AFRICAN AMERICAN: 48 mL/min — AB (ref >60)
Glucose, Bld: 129 mg/dL — ABNORMAL HIGH (ref 65–99)
POTASSIUM: 4.2 mmol/L (ref 3.5–5.1)
SODIUM: 143 mmol/L (ref 135–145)

## 2016-06-27 LAB — PROTIME-INR
INR: 1.07
Prothrombin Time: 14 seconds (ref 11.4–15.2)

## 2016-06-27 LAB — CBC
HEMATOCRIT: 32.9 % — AB (ref 36.0–46.0)
HEMOGLOBIN: 10.2 g/dL — AB (ref 12.0–15.0)
MCH: 31.5 pg (ref 26.0–34.0)
MCHC: 31 g/dL (ref 30.0–36.0)
MCV: 101.5 fL — AB (ref 78.0–100.0)
PLATELETS: 80 10*3/uL — AB (ref 150–400)
RBC: 3.24 MIL/uL — AB (ref 3.87–5.11)
RDW: 15.9 % — ABNORMAL HIGH (ref 11.5–15.5)
WBC: 4.6 10*3/uL (ref 4.0–10.5)

## 2016-06-27 LAB — APTT: aPTT: 26 seconds (ref 24–36)

## 2016-06-27 MED ORDER — TOBRAMYCIN SULFATE 1.2 G IJ SOLR
INTRAMUSCULAR | Status: AC
  Administered 2016-06-27: 0.2 g
  Filled 2016-06-27: qty 1.2

## 2016-06-27 MED ORDER — SODIUM CHLORIDE 0.9 % IV SOLN
INTRAVENOUS | Status: DC
  Administered 2016-06-27: 09:00:00 via INTRAVENOUS

## 2016-06-27 MED ORDER — HYDROMORPHONE HCL 1 MG/ML IJ SOLN
INTRAMUSCULAR | Status: AC
  Filled 2016-06-27: qty 1

## 2016-06-27 MED ORDER — SODIUM CHLORIDE 0.9 % IV SOLN: INTRAVENOUS | Status: AC

## 2016-06-27 MED ORDER — FENTANYL CITRATE (PF) 100 MCG/2ML IJ SOLN
INTRAMUSCULAR | Status: AC
  Filled 2016-06-27: qty 4

## 2016-06-27 MED ORDER — IOPAMIDOL (ISOVUE-300) INJECTION 61%
INTRAVENOUS | Status: AC
  Administered 2016-06-27: 10 mL
  Filled 2016-06-27: qty 50

## 2016-06-27 MED ORDER — FENTANYL CITRATE (PF) 100 MCG/2ML IJ SOLN
INTRAMUSCULAR | Status: AC | PRN
  Administered 2016-06-27 (×3): 25 ug via INTRAVENOUS

## 2016-06-27 MED ORDER — CEFAZOLIN SODIUM-DEXTROSE 2-4 GM/100ML-% IV SOLN
2.0000 g | INTRAVENOUS | Status: AC
  Administered 2016-06-27: 2 g via INTRAVENOUS

## 2016-06-27 MED ORDER — MIDAZOLAM HCL 2 MG/2ML IJ SOLN
INTRAMUSCULAR | Status: AC
  Filled 2016-06-27: qty 4

## 2016-06-27 MED ORDER — BUPIVACAINE HCL (PF) 0.25 % IJ SOLN
INTRAMUSCULAR | Status: AC
  Administered 2016-06-27: 15 mL
  Filled 2016-06-27: qty 30

## 2016-06-27 MED ORDER — MIDAZOLAM HCL 2 MG/2ML IJ SOLN
INTRAMUSCULAR | Status: AC | PRN
  Administered 2016-06-27 (×2): 1 mg via INTRAVENOUS

## 2016-06-27 MED ORDER — CEFAZOLIN SODIUM-DEXTROSE 2-4 GM/100ML-% IV SOLN
INTRAVENOUS | Status: AC
  Filled 2016-06-27: qty 100

## 2016-06-27 NOTE — H&P (Signed)
Chief Complaint: Patient was seen in consultation today for Lumbar 1 vertebroplasty/kyphoplasty at the request of Dr Melina Schools  Referring Physician(s): Dr Melina Schools  Supervising Physician: Luanne Bras  Patient Status: [name] - Out-pt  History of Present Illness: [name] is a 70 y.o. female   Pt fell at home Oct 21. Has had worsening back pain and muscular spasms since then. Pain meds are without relief. Was referred to Dr Estanislado Pandy for evaluation and possible treatment. Was seen in consultation 06/25/16  The patient's MRI scan the lumbosacral spine is not available at this time. The report is. This was performed at an outside institution which mentions an L1 fracture. She had a CT scan of the abdomen on 06/11/2016 which does depict the presence of a compression fracture at L1.  Now scheduled for Lumbar 1 vertebroplasty/kyphoplasty  Past Medical History:  Diagnosis Date  . COPD (chronic obstructive pulmonary disease) ([place])   . Esophageal varices ([place])   . Fibromyalgia   . Gastritis   . Hepatic cirrhosis ([place])   . Hepatic encephalopathy ([place])   . Hyperlipidemia   . Hypertension   . IBS (irritable bowel syndrome)   . Splenomegaly   . Vitamin D deficiency     Past Surgical History:  Procedure Laterality Date  . ABDOMINAL HYSTERECTOMY    . BACK SURGERY    . CHOLECYSTECTOMY    . ESOPHAGOGASTRODUODENOSCOPY  07/16/2012   Procedure: ESOPHAGOGASTRODUODENOSCOPY (EGD);  Surgeon: Inda Castle, MD;  Location: [facility];  Service: Endoscopy;  Laterality: N/A;  . GASTRIC VARICES BANDING  07/16/2012   Procedure: GASTRIC VARICES BANDING;  Surgeon: Inda Castle, MD;  Location: [facility];  Service: Endoscopy;  Laterality: N/A;  . HIP ARTHROPLASTY Bilateral   . IR GENERIC HISTORICAL  06/25/2016   IR RADIOLOGIST EVAL & MGMT 06/25/2016 [facility] RAD  . NECK SURGERY    . SHOULDER SURGERY Right     Allergies: Atorvastatin; Diphenhydramine hcl  (sleep); Hydrocodone-acetaminophen; Lopid [gemfibrozil]; Loratadine; Lorazepam; Simvastatin; Sulfamethoxazole; and Sulfonamide derivatives  Medications: Prior to Admission medications   Medication Sig Start Date End Date Taking? Authorizing Provider  albuterol (PROVENTIL) (2.5 MG/3ML) 0.083% nebulizer solution Take 2.5 mg by nebulization every 4 (four) hours as needed for wheezing or shortness of breath.  04/11/16  Yes Historical Provider, MD  allopurinol (ZYLOPRIM) 300 MG tablet take 1/2-1 tablet by mouth daily for gout Patient taking differently: TAKE 300 MG BY MOUTH DAILY 04/10/16  Yes Unk Pinto, MD  ALPRAZolam Duanne Moron) 1 MG tablet Take 1 mg by mouth at bedtime as needed for anxiety.   Yes Historical Provider, MD  benzonatate (TESSALON) 100 MG capsule Take 100 mg by mouth daily as needed for cough.    Yes Historical Provider, MD  bisoprolol (ZEBETA) 10 MG tablet Take 1 tablet (10 mg total) by mouth daily. 09/13/15  Yes Unk Pinto, MD  calcitonin, salmon, (MIACALCIN/FORTICAL) 200 UNIT/ACT nasal spray Place 1 spray into alternate nostrils daily. 06/13/16  Yes Historical Provider, MD  citalopram (CELEXA) 40 MG tablet Take 1 tablet (40 mg total) by mouth 2 (two) times daily. 02/29/16  Yes Vicie Mutters, PA-C  cyclobenzaprine (FLEXERIL) 10 MG tablet Take 1 tablet (10 mg total) by mouth 3 (three) times daily as needed for muscle spasms. 03/06/16  Yes Unk Pinto, MD  ferrous sulfate 325 (65 FE) MG tablet Take 325 mg by mouth daily with breakfast.   Yes Historical Provider, MD  furosemide (LASIX) 80 MG tablet Take 80 mg in  the morning and one-half tablet (40 mg) in the evenings 03/06/16  Yes Vicie Mutters, PA-C  gabapentin (NEURONTIN) 300 MG capsule take 1 capsule by mouth in the morning, 1 capsule in the afternoon and 1-3 capsules at night Patient taking differently: take 1 capsule by mouth TID 05/30/16  Yes Vicie Mutters, PA-C  ipratropium (ATROVENT) 0.02 % nebulizer solution use 1 vial in  nebulizer EVERY 4 HOURS AS NEEDED FOR WHEEZING OR SHORTNESS OF BREATH 05/31/16  Yes Vicie Mutters, PA-C  lactulose (CHRONULAC) 10 GM/15ML solution Take 30 ml (1 ounce) 2 x day Patient taking differently: Take 20 g by mouth 2 (two) times daily.  10/05/15 10/04/16 Yes Unk Pinto, MD  Magnesium 250 MG TABS Take 250 mg by mouth at bedtime.    Yes Historical Provider, MD  ondansetron (ZOFRAN) 4 MG tablet Take 1 tablet (4 mg total) by mouth daily as needed for nausea or vomiting. 10/06/15 10/05/16 Yes Vicie Mutters, PA-C  oxyCODONE (OXY IR/ROXICODONE) 5 MG immediate release tablet Take 5 mg by mouth every 4 (four) hours as needed for severe pain.   Yes Historical Provider, MD  OXYGEN Inhale 3 L/min into the lungs continuous.    Yes Historical Provider, MD  pantoprazole (PROTONIX) 40 MG tablet Take 1 tablet (40 mg total) by mouth 2 (two) times daily. 06/25/16  Yes Unk Pinto, MD  potassium chloride (K-DUR) 10 MEQ tablet Take 6 tablets (60 mEq total) by mouth daily. Patient taking differently: Take 2 tablets (40 mEq total) by mouth three time daily. 06/26/16  Yes Unk Pinto, MD  rifaximin (XIFAXAN) 550 MG TABS tablet Take 550 mg by mouth daily as needed (IBS/diarrhea symptoms).    Yes Historical Provider, MD  tamoxifen (NOLVADEX) 20 MG tablet Take 1 tablet (20 mg total) by mouth daily. 11/15/15  Yes Unk Pinto, MD  terconazole (TERAZOL 7) 0.4 % vaginal cream Place 1 applicator vaginally at bedtime. Patient taking differently: Place 1 applicator vaginally daily as needed (for vaginal discomfort).  06/18/16  Yes Vicie Mutters, PA-C  traMADol (ULTRAM) 50 MG tablet take 1 TABLET BY MOUTH EVERY 8 HOURS Patient taking differently: TAKE 50 MG BY MOUTH EVERY 8 HOURS AS NEEDED FOR PAIN 12/28/15  Yes Courtney Forcucci, PA-C  triamcinolone cream (KENALOG) 0.1 % Apply 1 application topically 3 (three) times daily as needed for itching. 03/22/16  Yes Historical Provider, MD  albuterol (PROVENTIL HFA;VENTOLIN  HFA) 108 (90 Base) MCG/ACT inhaler Inhale 2 puffs into the lungs every 6 (six) hours as needed for wheezing or shortness of breath. 05/27/16   Kristen N Ward, DO  aspirin 81 MG tablet Take 81 mg by mouth at bedtime.     Historical Provider, MD     Family History  Problem Relation Age of Onset  . Diabetes Brother     deceased  . Heart disease Sister     A Fib  . Heart disease Mother     CHF  . Atrial fibrillation Sister   . Colon cancer Neg Hx     Social History   Social History  . Marital status: Married    Spouse name: [name]  . Number of children: 5  . Years of education: N/A   Occupational History  . Housewife Unemployed   Social History Main Topics  . Smoking status: Former Smoker    Packs/day: 1.00    Years: 50.00    Types: Cigarettes    Quit date: 11/05/2014  . Smokeless tobacco: Never Used  Comment: Quit April 2016  . Alcohol use No  . Drug use: No  . Sexual activity: Not Asked   Other Topics Concern  . None   Social History Narrative  . None    Review of Systems: A 12 point ROS discussed and pertinent positives are indicated in the HPI above.  All other systems are negative.  Review of Systems  Constitutional: Positive for activity change and appetite change. Negative for fever.  Respiratory: Positive for shortness of breath.   Gastrointestinal: Negative for abdominal pain.  Musculoskeletal: Positive for back pain and gait problem.  Neurological: Positive for weakness.  Psychiatric/Behavioral: Negative for behavioral problems and confusion.    Vital Signs: BP (!) 147/54   Pulse 61   Temp 98.7 F (37.1 C) (Oral)   Resp 16   Ht 5' 2.5" (1.588 m)   Wt 176 lb (79.8 kg)   SpO2 97% Comment: 3 liters  BMI 31.68 kg/m   Physical Exam  Cardiovascular: Normal rate, regular rhythm and normal heart sounds.   Pulmonary/Chest: Effort normal and breath sounds normal.  Abdominal: Soft. Bowel sounds are normal.  Musculoskeletal: Normal range of motion.  She exhibits tenderness.  Low back pain  Skin: Skin is warm and dry.  Psychiatric: She has a normal mood and affect. Her behavior is normal. Judgment and thought content normal.  Nursing note and vitals reviewed.   Mallampati Score:  MD Evaluation Airway: WNL Heart: WNL Abdomen: WNL Chest/ Lungs: WNL ASA  Classification: 3 Mallampati/Airway Score: Two  Imaging: Ct Abdomen Pelvis W Contrast  Result Date: 06/11/2016 CLINICAL DATA:  Diffuse abdominal pain EXAM: CT ABDOMEN AND PELVIS WITH CONTRAST TECHNIQUE: Multidetector CT imaging of the abdomen and pelvis was performed using the standard protocol following bolus administration of intravenous contrast. CONTRAST:  82m ISOVUE-300 IOPAMIDOL (ISOVUE-300) INJECTION 61% COMPARISON:  Abdominal CT 07/01/2012 FINDINGS: Lower chest: There is right middle lobe and lingular atelectasis, incompletely visualized. There is a small left pleural effusion. No pulmonary nodules or masses are seen. Hepatobiliary: The left hepatic lobe is enlarged. There is a diffusely nodular liver contour. The gallbladder is surgically absent. There is no biliary dilatation. No discrete hepatic lesions. No ascites. Pancreas: Normal pancreatic contours and enhancement. No peripancreatic fluid collection or pancreatic ductal dilatation. Spleen: Spleen is enlarged, measuring 15.6 cm in craniocaudal dimension. No focal internal abnormality. Adrenals/Urinary Tract: Normal adrenal glands. Right renal calcification measures 5.3 mm. There are multiple bilateral renal cysts, which measure up to 2 cm. No hydronephrosis. Stomach/Bowel: No abnormal bowel dilatation. No bowel wall thickening or adjacent fat stranding to indicate acute inflammation. No abdominal fluid collection. Despite review of multiplanar reformatted images in 3 orthogonal planes, the appendix could not be adequately visualized. However, there is no inflammatory stranding or free fluid within the right lower quadrant.  Vascular/Lymphatic: There is atherosclerotic calcification of the non aneurysmal abdominal aorta. The portal vein, superior mesenteric vein and splenic vein are patent. No abdominal or pelvic adenopathy. There are perisplenic varices. Reproductive: Visualization of the pelvic organs is limited by streak artifact from bilateral hip prostheses. Musculoskeletal: L4-S1 posterior spinal fusion without focal hardware abnormality. Bilateral hip prostheses. No advanced bony spinal canal stenosis. Multiple right posterior healed rib fractures. Normal visualized extrathoracic and extraperitoneal soft tissues. Other: Mild mesenteric edema. IMPRESSION: 1. Hepatic cirrhosis and findings of portal hypertension, including splenomegaly and perisplenic varices. 2. Right nephrolithiasis with unchanged 5 mm peripheral right-sided renal calculus. 3. Small pleural effusion. Electronically Signed   By: KUlyses Jarred  M.D.   On: 06/11/2016 23:18   Ir Radiologist Eval & Mgmt  Result Date: 06/26/2016 EXAM: NEW PATIENT OFFICE VISIT CHIEF COMPLAINT: Severe low back pain secondary to compression fracture at L1. Current Pain Level: 1-10 HISTORY OF PRESENT ILLNESS: The patient is a 70 year old right-handed lady who has been referred for evaluation of pain relief due to compression fracture at L1. The patient is accompanied by her husband. Basically the patient reports symptoms emanating right after a fall on her buttocks in early October of this year. The pain was almost immediate. The pain has since been constant almost a 10 out of 10 without any pain medications. She has difficulty ambulating standing, sitting and even sleeping at night time. She denies any autonomic dysfunction of her bowel or bladder activities. She denies recent chills, fever or rigors. Denies any UTI-like symptoms of dysuria, hematuria or polyuria. She reports radiation of achiness in her upper thighs but there is no radicular distribution of pain, numbness or  paresthesias below her knees. Her gait has been severely hampered on account of the pain. She ambulates with a walker a few steps. However, she has to sit down for pain relief. Pain relief is also provided by taking pain killers especially cyclobenzaprine which is a muscle relaxant, gabapentin and tramadol. The patient has been using a walker even prior to her fall resulting in the fracture. According to her she last normally walked approximately 6-7 years ago. She states she does get breathless on walking on account of her COPD despite being on an oxygen tank most the time. She is unable to sleep at night mostly on account of her severe pain. She denies any recent difficulty breathing such as wheezing, coughing or spitting blood or phlegm. Past Medical History: Cirrhosis of the liver. Splenomegaly. Left impingement syndrome. Left knee pain. Bursitis left hip. Left shoulder pain. Injury to her right foot. Lumbar post laminectomy with fusion by neuro surgery. Leg swelling on the left. Previous history of left-sided sciatica. Past Surgical History: Spinal fusion in the neck and lower back. Rotator cuff repair. Total hip replacement bilaterally. Previous spinal surgery, neck surgery, gallbladder surgery, breast biopsy bilaterally. Leg recirculation surgery. Hysterectomy complete. Medications: Calcitonin, levofloxacin, aspirin, magnesium gluconate, ferrous sulfate, spironolactone, amitriptyline, vitamin D3, cyclobenzaprine, tamoxifen, pantoprazole, tylenol with codeine, gabapentin, citalopram, stool softener, allopurinol, colchicine, Amitiza, benzonatate, Zofran, furosemide 80 mg a day, Lactulose, bisoprolol, tramadol. As needed medications: Alprazolam, Oxycodone. Allergies: Lopid, Vicodin, Lipitor, sulfamethoxazole, Ativan, Benadryl, Claritin, niacin. Social History: Married. Has 3 sons alive and well. Two siblings alive and well. Denies drinking alcohol or cigarette smoking or illicit drugs. Does not smoke. Family  History: Mother deceased due to congestive heart failure. Brothers deceased at age 52 sepsis. Father deceased age 23 lung carcinoma. REVIEW OF SYSTEMS: As mentioned above. PHYSICAL EXAMINATION: Appears to be in severe pain on account pain in her low back region. Affect mildly depressed. Neurologically grossly intact. Station and gait not tested but patient in a wheelchair. Patient is severely tender overlying the L1 region on palpation of the back. ASSESSMENT AND PLAN: The patient's MRI scan the lumbosacral spine is not available at this time. The report is. This was performed at an outside institution which mentions an L1 fracture. She had a CT scan of the abdomen on 06/11/2016 which does depict the presence of a compression fracture at L1. Surgical fusion noted at L4-5 and S1. The option of vertebral body augmentation to prevent further collapse and to alleviate pain was reviewed in detail.  The procedure, the risks, benefits were all reviewed. Questions were answered to their satisfaction. The patient and the family would like to proceed with the L1 vertebral body augmentation. The husband was asked to bring the MRI scan that was done recently. The patient has been asked to refrain from stooping, bending or lifting anything above 10 pounds and to ambulate only as tolerated with a walker. They were asked to call should they have any concerns or questions. The procedure of the vertebral body augmentation will be scheduled as soon as possible. Electronically Signed   By: Luanne Bras M.D.   On: 06/25/2016 18:41    Labs:  CBC:  Recent Labs  05/27/16 0110 06/11/16 1949 06/26/16 1205 06/27/16 0850  WBC 9.0 6.7 4.5 4.6  HGB 10.8* 10.6* 10.5* 10.2*  HCT 34.6* 33.5* 33.4* 32.9*  PLT 79* 132* 92* PENDING    COAGS:  Recent Labs  11/10/15 1309 01/31/16 1642 05/27/16 0110 06/27/16 0850  INR 1.1* 1.0 1.14 1.07  APTT  --   --  29 26    BMP:  Recent Labs  05/27/16 0110 06/11/16 1949  06/26/16 1205 06/27/16 0850  NA 138 138 143 143  K 4.2 2.9* 3.7 4.2  CL 103 91* 103 104  CO2 29 36* 34* 33*  GLUCOSE 152* 141* 97 129*  BUN 14 15 14 11   CALCIUM 8.5* 8.8* 8.5* 8.9  CREATININE 1.10* 1.27* 1.26* 1.29*  GFRNONAA 50* 42* 44* 41*  GFRAA 58* 49* 50* 48*    LIVER FUNCTION TESTS:  Recent Labs  05/22/16 1609 05/27/16 0110 06/11/16 1949 06/26/16 1205  BILITOT 1.0 2.2* 1.1 1.0  AST 32 33 30 29  ALT 14 17 11* 12  ALKPHOS 103 94 94 135*  PROT 5.7* 6.0* 5.7* 5.3*  ALBUMIN 3.6 3.3* 3.1* 3.1*    TUMOR MARKERS:  Recent Labs  11/10/15 1309  AFPTM 5.9    Assessment and Plan:  [place] at home last month Pain meds are without relief MRI was performed in outside facility Report does reflect acute L1 fracture CT Abd 11/6 also shows fracture. Now scheduled for vertebroplasty/kyphoplasty Risks and Benefits discussed with the patient including, but not limited to education regarding the natural healing process of compression fractures without intervention, bleeding, infection, cement migration which may cause spinal cord damage, paralysis, pulmonary embolism or even death. All of the patient's questions were answered, patient is agreeable to proceed. Consent signed and in chart.  Thank you for this interesting consult.  I greatly enjoyed meeting IVORI STORR and look forward to participating in their care.  A copy of this report was sent to the requesting provider on this date.  Electronically Signed: Tonda Wiederhold A 06/27/2016, 9:45 AM   I spent a total of  30 Minutes   in face to face in clinical consultation, greater than 50% of which was counseling/coordinating care for Lumbar 1 VP/KP

## 2016-06-27 NOTE — Procedures (Signed)
S/P L1 Balloon KP

## 2016-06-27 NOTE — Discharge Instructions (Signed)
KYPHOPLASTY/VERTEBROPLASTY DISCHARGE INSTRUCTIONS  Medications: (check all that apply)     Resume all home medications as before procedure.                      Continue your pain medications as prescribed as needed.  Over the next 3-5 days, decrease your pain medication as tolerated.  Over the counter medications (i.e. Tylenol, ibuprofen, and aleve) may be substituted once severe/moderate pain symptoms have subsided.   Wound Care: - Bandages may be removed the day following your procedure.  You may get your incision wet once bandages are removed.  Bandaids may be used to cover the incisions until scab formation.  Topical ointments are optional.  - If you develop a fever greater than 101 degrees, have increased skin redness at the incision sites or pus-like oozing from incisions occurring within 1 week of the procedure, contact radiology at [phone] or [phone].  - Ice pack to back for 15-20 minutes 2-3 time per day for first 2-3 days post procedure.  The ice will expedite muscle healing and help with the pain from the incisions.   Activity: - Bedrest today with limited activity for 24 hours post procedure.  - No driving for 48 hours.  - Increase your activity as tolerated after bedrest (with assistance if necessary).  - Refrain from any strenuous activity or heavy lifting (greater than 10 lbs.).   Follow up: - Contact radiology at [phone] or [phone] if any questions/concerns.  - A physician assistant from radiology will contact you in approximately 1 week.  - If a biopsy was performed at the time of your procedure, your referring physician should receive the results in usually 2-3 days.        1.No stooping,bending or lifting more than 1- lbs for 2 weeks. 2.Use walker to ambulate for 2 weeks. 3.RTC PRN 2 weeks

## 2016-06-29 ENCOUNTER — Ambulatory Visit (HOSPITAL_COMMUNITY)
Admission: RE | Admit: 2016-06-29 | Discharge: 2016-06-29 | Disposition: A | Discharge status: Home / Self Care | Payer: PPO | Source: Ambulatory Visit | Attending: Physician Assistant | Admitting: Physician Assistant

## 2016-06-29 DIAGNOSIS — M7989 Other specified soft tissue disorders: Secondary | ICD-10-CM | POA: Insufficient documentation

## 2016-06-29 DIAGNOSIS — Z79899 Other long term (current) drug therapy: Secondary | ICD-10-CM | POA: Insufficient documentation

## 2016-06-29 DIAGNOSIS — M79662 Pain in left lower leg: Secondary | ICD-10-CM | POA: Diagnosis not present

## 2016-06-29 NOTE — Progress Notes (Signed)
VASCULAR LAB PRELIMINARY  PRELIMINARY  PRELIMINARY  PRELIMINARY  Left lower extremity venous duplex completed.    Preliminary report:  Left:  No evidence of DVT, superficial thrombosis, or Baker's cyst.  [name], RVS 06/29/2016, 1:51 PM

## 2016-06-29 NOTE — Progress Notes (Signed)
06/29/2016 02:00 pm Attempted to call report to Vicie Mutters PA. No answer. Results in progress noe in EPIC - Negative Lt Le venous [place]

## 2016-07-02 ENCOUNTER — Encounter (HOSPITAL_COMMUNITY): Payer: Self-pay | Admitting: Interventional Radiology

## 2016-07-02 ENCOUNTER — Other Ambulatory Visit (HOSPITAL_COMMUNITY): Payer: Self-pay | Admitting: Interventional Radiology

## 2016-07-02 DIAGNOSIS — IMO0001 Reserved for inherently not codable concepts without codable children: Secondary | ICD-10-CM

## 2016-07-02 DIAGNOSIS — M4850XS Collapsed vertebra, not elsewhere classified, site unspecified, sequela of fracture: Principal | ICD-10-CM

## 2016-07-03 ENCOUNTER — Ambulatory Visit (HOSPITAL_COMMUNITY): Payer: PPO

## 2016-07-11 ENCOUNTER — Emergency Department (HOSPITAL_COMMUNITY): Payer: PPO

## 2016-07-11 ENCOUNTER — Ambulatory Visit (INDEPENDENT_AMBULATORY_CARE_PROVIDER_SITE_OTHER): Payer: PPO | Admitting: Internal Medicine

## 2016-07-11 ENCOUNTER — Inpatient Hospital Stay (HOSPITAL_COMMUNITY)
Admission: EM | Admit: 2016-07-11 | Discharge: 2016-07-13 | DRG: 641 | Disposition: A | Discharge status: Home / Self Care | Payer: PPO | Attending: Internal Medicine | Admitting: Internal Medicine

## 2016-07-11 ENCOUNTER — Encounter: Payer: Self-pay | Admitting: Internal Medicine

## 2016-07-11 ENCOUNTER — Encounter (HOSPITAL_COMMUNITY): Payer: Self-pay | Admitting: *Deleted

## 2016-07-11 VITALS — BP 114/60 | HR 64 | Temp 101.2°F | Resp 16 | Ht 62.0 in

## 2016-07-11 DIAGNOSIS — Z9981 Dependence on supplemental oxygen: Secondary | ICD-10-CM

## 2016-07-11 DIAGNOSIS — R1031 Right lower quadrant pain: Secondary | ICD-10-CM

## 2016-07-11 DIAGNOSIS — T501X5A Adverse effect of loop [high-ceiling] diuretics, initial encounter: Secondary | ICD-10-CM | POA: Diagnosis not present

## 2016-07-11 DIAGNOSIS — K766 Portal hypertension: Secondary | ICD-10-CM | POA: Diagnosis not present

## 2016-07-11 DIAGNOSIS — K7460 Unspecified cirrhosis of liver: Secondary | ICD-10-CM | POA: Diagnosis not present

## 2016-07-11 DIAGNOSIS — Z888 Allergy status to other drugs, medicaments and biological substances status: Secondary | ICD-10-CM

## 2016-07-11 DIAGNOSIS — K589 Irritable bowel syndrome without diarrhea: Secondary | ICD-10-CM | POA: Diagnosis not present

## 2016-07-11 DIAGNOSIS — Z882 Allergy status to sulfonamides status: Secondary | ICD-10-CM | POA: Diagnosis not present

## 2016-07-11 DIAGNOSIS — I129 Hypertensive chronic kidney disease with stage 1 through stage 4 chronic kidney disease, or unspecified chronic kidney disease: Secondary | ICD-10-CM | POA: Diagnosis present

## 2016-07-11 DIAGNOSIS — E669 Obesity, unspecified: Secondary | ICD-10-CM | POA: Diagnosis not present

## 2016-07-11 DIAGNOSIS — K7581 Nonalcoholic steatohepatitis (NASH): Secondary | ICD-10-CM | POA: Diagnosis present

## 2016-07-11 DIAGNOSIS — N183 Chronic kidney disease, stage 3 unspecified: Secondary | ICD-10-CM | POA: Diagnosis present

## 2016-07-11 DIAGNOSIS — D696 Thrombocytopenia, unspecified: Secondary | ICD-10-CM | POA: Diagnosis not present

## 2016-07-11 DIAGNOSIS — M797 Fibromyalgia: Secondary | ICD-10-CM | POA: Diagnosis not present

## 2016-07-11 DIAGNOSIS — E876 Hypokalemia: Secondary | ICD-10-CM | POA: Diagnosis present

## 2016-07-11 DIAGNOSIS — I8500 Esophageal varices without bleeding: Secondary | ICD-10-CM | POA: Diagnosis not present

## 2016-07-11 DIAGNOSIS — E785 Hyperlipidemia, unspecified: Secondary | ICD-10-CM | POA: Diagnosis not present

## 2016-07-11 DIAGNOSIS — N390 Urinary tract infection, site not specified: Secondary | ICD-10-CM | POA: Diagnosis not present

## 2016-07-11 DIAGNOSIS — I10 Essential (primary) hypertension: Secondary | ICD-10-CM | POA: Diagnosis not present

## 2016-07-11 DIAGNOSIS — Z79899 Other long term (current) drug therapy: Secondary | ICD-10-CM

## 2016-07-11 DIAGNOSIS — Z833 Family history of diabetes mellitus: Secondary | ICD-10-CM

## 2016-07-11 DIAGNOSIS — R1011 Right upper quadrant pain: Secondary | ICD-10-CM | POA: Diagnosis not present

## 2016-07-11 DIAGNOSIS — E872 Acidosis, unspecified: Secondary | ICD-10-CM | POA: Diagnosis present

## 2016-07-11 DIAGNOSIS — R52 Pain, unspecified: Secondary | ICD-10-CM | POA: Diagnosis not present

## 2016-07-11 DIAGNOSIS — Z6832 Body mass index (BMI) 32.0-32.9, adult: Secondary | ICD-10-CM

## 2016-07-11 DIAGNOSIS — D638 Anemia in other chronic diseases classified elsewhere: Secondary | ICD-10-CM | POA: Diagnosis not present

## 2016-07-11 DIAGNOSIS — R740 Nonspecific elevation of levels of transaminase and lactic acid dehydrogenase [LDH]: Secondary | ICD-10-CM | POA: Diagnosis not present

## 2016-07-11 DIAGNOSIS — Z7982 Long term (current) use of aspirin: Secondary | ICD-10-CM

## 2016-07-11 DIAGNOSIS — J449 Chronic obstructive pulmonary disease, unspecified: Secondary | ICD-10-CM | POA: Diagnosis not present

## 2016-07-11 DIAGNOSIS — E1122 Type 2 diabetes mellitus with diabetic chronic kidney disease: Secondary | ICD-10-CM | POA: Diagnosis present

## 2016-07-11 DIAGNOSIS — R7989 Other specified abnormal findings of blood chemistry: Secondary | ICD-10-CM

## 2016-07-11 DIAGNOSIS — Z86010 Personal history of colonic polyps: Secondary | ICD-10-CM

## 2016-07-11 DIAGNOSIS — Z87891 Personal history of nicotine dependence: Secondary | ICD-10-CM

## 2016-07-11 DIAGNOSIS — Z8249 Family history of ischemic heart disease and other diseases of the circulatory system: Secondary | ICD-10-CM

## 2016-07-11 LAB — CBC WITH DIFFERENTIAL/PLATELET
BASOS PCT: 0 %
Basophils Absolute: 0 10*3/uL (ref 0.0–0.1)
EOS ABS: 0.5 10*3/uL (ref 0.0–0.7)
EOS PCT: 10 %
HCT: 35.3 % — ABNORMAL LOW (ref 36.0–46.0)
Hemoglobin: 11.3 g/dL — ABNORMAL LOW (ref 12.0–15.0)
Lymphocytes Relative: 23 %
Lymphs Abs: 1.1 10*3/uL (ref 0.7–4.0)
MCH: 31.7 pg (ref 26.0–34.0)
MCHC: 32 g/dL (ref 30.0–36.0)
MCV: 99.2 fL (ref 78.0–100.0)
MONO ABS: 0.3 10*3/uL (ref 0.1–1.0)
MONOS PCT: 6 %
Neutro Abs: 3 10*3/uL (ref 1.7–7.7)
Neutrophils Relative %: 61 %
Platelets: 98 10*3/uL — ABNORMAL LOW (ref 150–400)
RBC: 3.56 MIL/uL — ABNORMAL LOW (ref 3.87–5.11)
RDW: 15.3 % (ref 11.5–15.5)
WBC: 5 10*3/uL (ref 4.0–10.5)

## 2016-07-11 LAB — URINALYSIS, ROUTINE W REFLEX MICROSCOPIC
BILIRUBIN URINE: NEGATIVE
GLUCOSE, UA: NEGATIVE mg/dL
KETONES UR: NEGATIVE mg/dL
LEUKOCYTES UA: NEGATIVE
NITRITE: NEGATIVE
PH: 7 (ref 5.0–8.0)
Protein, ur: NEGATIVE mg/dL
Specific Gravity, Urine: 1.005 (ref 1.005–1.030)

## 2016-07-11 LAB — COMPREHENSIVE METABOLIC PANEL
ALK PHOS: 146 U/L — AB (ref 38–126)
ALT: 18 U/L (ref 14–54)
ANION GAP: 11 (ref 5–15)
AST: 39 U/L (ref 15–41)
Albumin: 3.3 g/dL — ABNORMAL LOW (ref 3.5–5.0)
BILIRUBIN TOTAL: 1 mg/dL (ref 0.3–1.2)
BUN: 13 mg/dL (ref 6–20)
CALCIUM: 9.3 mg/dL (ref 8.9–10.3)
CO2: 33 mmol/L — AB (ref 22–32)
Chloride: 94 mmol/L — ABNORMAL LOW (ref 101–111)
Creatinine, Ser: 1.23 mg/dL — ABNORMAL HIGH (ref 0.44–1.00)
GFR calc non Af Amer: 44 mL/min — ABNORMAL LOW (ref >60)
GFR, EST AFRICAN AMERICAN: 51 mL/min — AB (ref >60)
Glucose, Bld: 94 mg/dL (ref 65–99)
POTASSIUM: 3.6 mmol/L (ref 3.5–5.1)
SODIUM: 138 mmol/L (ref 135–145)
TOTAL PROTEIN: 5.8 g/dL — AB (ref 6.5–8.1)

## 2016-07-11 LAB — PROTIME-INR
INR: 1.09
Prothrombin Time: 14.1 seconds (ref 11.4–15.2)

## 2016-07-11 LAB — I-STAT CG4 LACTIC ACID, ED
LACTIC ACID, VENOUS: 1.69 mmol/L (ref 0.5–1.9)
LACTIC ACID, VENOUS: 3.04 mmol/L — AB (ref 0.5–1.9)

## 2016-07-11 LAB — LIPASE, BLOOD: Lipase: 36 U/L (ref 11–51)

## 2016-07-11 MED ORDER — DEXTROSE 5 % IV SOLN
1.0000 g | Freq: Once | INTRAVENOUS | Status: AC
  Administered 2016-07-11: 1 g via INTRAVENOUS
  Filled 2016-07-11: qty 10

## 2016-07-11 MED ORDER — IOPAMIDOL (ISOVUE-300) INJECTION 61%
INTRAVENOUS | Status: AC
  Administered 2016-07-11: 80 mL
  Filled 2016-07-11: qty 100

## 2016-07-11 MED ORDER — SODIUM CHLORIDE 0.9 % IV BOLUS (SEPSIS)
500.0000 mL | Freq: Once | INTRAVENOUS | Status: AC
  Administered 2016-07-11: 500 mL via INTRAVENOUS

## 2016-07-11 MED ORDER — MORPHINE SULFATE (PF) 4 MG/ML IV SOLN
4.0000 mg | Freq: Once | INTRAVENOUS | Status: AC
  Administered 2016-07-11: 4 mg via INTRAVENOUS
  Filled 2016-07-11: qty 1

## 2016-07-11 NOTE — ED Triage Notes (Signed)
Pt reports hx of liver disease. Having right side abd pain for a few weeks but more severe since yesterday. Reports having a fever and mild nausea. Denies vomiting, diarrhea or urinary symptoms. Sent here from pcp to r/o spontaneous bacterial peritonitis.

## 2016-07-11 NOTE — ED Provider Notes (Signed)
Emergency Department Provider Note   I have reviewed the triage vital signs and the nursing notes.   HISTORY  Chief Complaint Abdominal Pain and Fever   HPI [name] is a 70 y.o. female with PMH of COPD, esophageal varices, HLD, HTN, and IBS presents to the emergency department for evaluation of 1 week of intermittent right sided abdominal pain. The patient's had similar pain in the past but has been more severe over the past several days. She went to her primary care physician today who referred her to the emergency department for rule out of SBP. The patient has known liver disease but has never had her ascites fluid drained or otherwise tested. The patient denies any associated nausea, vomiting, diarrhea. She denies chest pain or difficulty breathing.  Past Medical History:  Diagnosis Date  . Asthma   . COPD (chronic obstructive pulmonary disease) ([place])   . Esophageal varices ([place])   . Fibromyalgia   . Gastritis   . Hepatic cirrhosis ([place])   . Hepatic encephalopathy ([place])   . Hyperlipidemia   . Hypertension   . IBS (irritable bowel syndrome)   . Splenomegaly   . Vitamin D deficiency     Patient Active Problem List   Diagnosis Date Noted  . Atrial tachycardia, multifocal ([place])   . QT prolongation 03/05/2016  . Aspiration pneumonia ([place]) 01/24/2016  . Chronic respiratory failure with hypoxia ([place])   . COPD with exacerbation ([place])   . Acute renal failure superimposed on stage 3 chronic kidney disease ([place])   . Hyponatremia   . Acute on chronic respiratory failure ([place]) 01/19/2016  . DM type 2 causing CKD stage 3 ([place]) 10/30/2015  . Hepatic encephalopathy ([place]) 09/21/2015  . Hypokalemia 09/21/2015  . COPD GOLD II with min reversiblity  08/30/2015  . CKD stage 3 due to type 2 diabetes mellitus ([place]) 08/16/2015  . Chronic respiratory failure with hypoxia and hypercapnia ([place]) 08/07/2015  . Thrombocytopenia ([place]) 08/07/2015  . Venous insufficiency 07/26/2015    . Encounter for Medicare annual wellness exam 02/16/2015  . At high risk for falls 01/13/2015  . Obesity 09/21/2014  . Medication management 12/14/2013  . Essential hypertension   . Hyperlipidemia   . COPD (chronic obstructive pulmonary disease) ([place])   . GERD   . Vitamin D deficiency   . IBS   . Fibromyalgia   . Esophageal varices ([place]) 07/08/2012  . COLONIC POLYPS 11/11/2008  . Anemia 05/28/2008  . Hepatic cirrhosis ([place]) 05/28/2008  . Esophagitis 05/27/2008  . Diverticulosis of large intestine 05/27/2008    Past Surgical History:  Procedure Laterality Date  . ABDOMINAL HYSTERECTOMY    . BACK SURGERY    . CHOLECYSTECTOMY    . ESOPHAGOGASTRODUODENOSCOPY  07/16/2012   Procedure: ESOPHAGOGASTRODUODENOSCOPY (EGD);  Surgeon: Inda Castle, MD;  Location: [facility];  Service: Endoscopy;  Laterality: N/A;  . GASTRIC VARICES BANDING  07/16/2012   Procedure: GASTRIC VARICES BANDING;  Surgeon: Inda Castle, MD;  Location: [facility];  Service: Endoscopy;  Laterality: N/A;  . HIP ARTHROPLASTY Bilateral   . IR GENERIC HISTORICAL  06/25/2016   IR RADIOLOGIST EVAL & MGMT 06/25/2016 [facility]  . IR GENERIC HISTORICAL  06/27/2016   IR KYPHO LUMBAR INC FX REDUCE BONE BX UNI/BIL CANNULATION INC/IMAGING 06/27/2016 Luanne Bras, MD [facility]  . NECK SURGERY    . SHOULDER SURGERY Right     Current Outpatient Rx  . Order #: [id number] Class: Print  . Order #: [id number] Class:  Historical Med  . Order #: [id number] Class: Normal  . Order #: [id number] Class: Historical Med  . Order #: [id number] Class: Historical Med  . Order #: [id number] Class: Historical Med  . Order #: [id number] Class: Normal  . Order #: [id number] Class: Historical Med  . Order #: [id number] Class: Normal  . Order #: [id number] Class: Normal  . Order #: [id number] Class: Historical Med  . Order #: [id number] Class: Normal  . Order #: [id number] Class: Normal  . Order #: [id number] Class: Normal  . Order #:  [id number] Class: Historical Med  . Order #: [id number] Class: Normal  . Order #: [id number] Class: Historical Med  . Order #: [id number] Class: Normal  . Order #: [id number] Class: Normal  . Order #: [id number] Class: Historical Med  . Order #: [id number] Class: Phone In  . Order #: [id number] Class: Historical Med  . Order #: [id number] Class: Normal  . Order #: [id number] Class: Normal  . Order #: [id number] Class: Normal    Allergies Atorvastatin; Diphenhydramine hcl (sleep); Hydrocodone-acetaminophen; Lopid [gemfibrozil]; Loratadine; Lorazepam; Simvastatin; Sulfamethoxazole; and Sulfonamide derivatives  Family History  Problem Relation Age of Onset  . Diabetes Brother     deceased  . Heart disease Sister     A Fib  . Heart disease Mother     CHF  . Atrial fibrillation Sister   . Colon cancer Neg Hx     Social History Social History  Substance Use Topics  . Smoking status: Former Smoker    Packs/day: 1.00    Years: 50.00    Types: Cigarettes    Quit date: 11/05/2014  . Smokeless tobacco: Never Used     Comment: Quit April 2016  . Alcohol use No    Review of Systems  Constitutional: No chills. Positive fever.  Eyes: No visual changes. ENT: No sore throat. Cardiovascular: Denies chest pain. Respiratory: Denies shortness of breath. Gastrointestinal: Right sided abdominal pain.  No nausea, no vomiting.  No diarrhea.  No constipation. Genitourinary: Negative for dysuria. Musculoskeletal: Negative for back pain. Skin: Negative for rash. Neurological: Negative for headaches, focal weakness or numbness.  10-point ROS otherwise negative.  ____________________________________________   PHYSICAL EXAM:  VITAL SIGNS: ED Triage Vitals  Enc Vitals Group     BP 07/11/16 1528 138/56     Pulse Rate 07/11/16 1528 63     Resp 07/11/16 1528 18     Temp 07/11/16 1528 98.1 F (36.7 C)     Temp Source 07/11/16 1528 Oral     SpO2 07/11/16 1528 95 %     Pain Score 07/11/16 1937 7    Constitutional: Alert and oriented. Well appearing and in no acute distress. Eyes: Conjunctivae are normal. Head: Atraumatic. Nose: No congestion/rhinnorhea. Mouth/Throat: Mucous membranes are moist.  Oropharynx non-erythematous. Neck: No stridor.   Cardiovascular: Normal rate, regular rhythm. Good peripheral circulation. Grossly normal heart sounds.   Respiratory: Normal respiratory effort.  No retractions. Lungs CTAB. Gastrointestinal: Soft and nontender. Mild distension with right sided tenderness.  Musculoskeletal: No lower extremity tenderness nor edema. No gross deformities of extremities. Neurologic:  Normal speech and language. No gross focal neurologic deficits are appreciated.  Skin:  Skin is warm, dry and intact. No rash noted.  ____________________________________________   LABS (all labs ordered are listed, but only abnormal results are displayed)  Labs Reviewed  COMPREHENSIVE METABOLIC PANEL - Abnormal; Notable for the following:       Result Value   Chloride 94 (*)    CO2 33 (*)    Creatinine, Ser 1.23 (*)    Total Protein 5.8 (*)  Albumin 3.3 (*)    Alkaline Phosphatase 146 (*)    GFR calc non Af Amer 44 (*)    GFR calc Af Amer 51 (*)    All other components within normal limits  URINALYSIS, ROUTINE W REFLEX MICROSCOPIC - Abnormal; Notable for the following:    APPearance HAZY (*)    Hgb urine dipstick SMALL (*)    Bacteria, UA MANY (*)    Squamous Epithelial / LPF 6-30 (*)    All other components within normal limits  CBC WITH DIFFERENTIAL/PLATELET - Abnormal; Notable for the following:    RBC 3.56 (*)    Hemoglobin 11.3 (*)    HCT 35.3 (*)    Platelets 98 (*)    All other components within normal limits  I-STAT CG4 LACTIC ACID, ED - Abnormal; Notable for the following:    Lactic Acid, Venous 3.04 (*)    All other components within normal limits  LIPASE, BLOOD  PROTIME-INR  I-STAT CG4 LACTIC ACID, ED    ____________________________________________  RADIOLOGY  Ct Abdomen Pelvis W Contrast  Result Date: 07/11/2016 CLINICAL DATA:  Right-sided abdominal pain. Nausea. Symptoms for several weeks. EXAM: CT ABDOMEN AND PELVIS WITH CONTRAST TECHNIQUE: Multidetector CT imaging of the abdomen and pelvis was performed using the standard protocol following bolus administration of intravenous contrast. CONTRAST:  80 cc Isovue 300 IV COMPARISON:  06/11/2016, MRI 05/25/2015 FINDINGS: Lower chest: Chronic lingular and right middle lobe atelectasis. No pleural fluid, previous left pleural effusion has resolved. Hepatobiliary: Post cholecystectomy. Cirrhotic hepatic morphology with nodular contours and left lobe enlargement. Diffusely decreased density. Tiny 5 mm hypodensity within segment 6 is too small to accurately characterize but unchanged from prior MRI and consistent with cyst. No biliary dilatation. Pancreas: No ductal dilatation or inflammation. Spleen: Enlarged measuring 16.5 x 8.2 x 13.4 cm (volume = 950 cm^3). No focal splenic lesion. Adrenals/Urinary Tract: Chronic right renal pelvis dilatation with ureteral decompression. Again seen nonobstructing right renal stone and parenchymal calcification. Bilateral renal cysts are unchanged. Urinary bladder is physiologically distended. Stomach/Bowel: Stomach is decompressed with equivocal gastric wall thickening. No small or large bowel dilatation. Mild submucosal fatty infiltration involving the cecum and ascending colon suggesting chronic inflammation, no acute inflammatory change at this time. Appendix is not visualized. There is sigmoid colonic tortuosity. Vascular/Lymphatic: Perisplenic varices are again seen small paraesophageal varices. There is recannulization of the umbilical vein with periumbilical and anterior abdominal wall varices. Additional portosystemic collaterals noted within the abdomen. Atherosclerosis of the abdominal aorta without aneurysm. Small  porta hepatis nodes may be reactive. Portal vein is patent. Reproductive: Status post hysterectomy. No adnexal masses. Streak artifact from hip prostheses limit pelvic evaluation. Other: Similar mesenteric edema.  Trace ascites in the pelvis. Musculoskeletal: Enteral vertebral augmentation of L1. Posterior fusion L4 through S1. Bilateral hip prosthesis in place. IMPRESSION: 1. Cirrhosis with portal hypertension, varices and splenomegaly. This appears unchanged from recent prior exam. Trace pelvic ascites. 2. Equivocal gastric wall thickening, can be seen with gastritis. 3. Resolved left pleural effusion. 4. Additional stable chronic findings. Electronically Signed   By: Jeb Levering M.D.   On: 07/11/2016 22:14    ____________________________________________   PROCEDURES  Procedure(s) performed:   Procedures  Abdominal Exam: Limited Ultrasound of the abdomen.  Multiple views of the abdomen and pericardium are obtained with a multi-frequency probe.  EMERGENCY DEPARTMENT [place] FAST EXAM  INDICATIONS: Evaluation of ascites   PERFORMED BY: Myself  IMAGES ARCHIVED?: Yes  FINDINGS: No significant abdominal fluid.  LIMITATIONS:  None  INTERPRETATION:  Normal exam. No ascites pocket appreciated.   CPT Codes: cardiac [id number], abdomen [phone] (study includes both codes) ____________________________________________   INITIAL IMPRESSION / ASSESSMENT AND PLAN / ED COURSE  Pertinent labs & imaging results that were available during my care of the patient were reviewed by me and considered in my medical decision making (see chart for details).  Patient resents emergency department for evaluation of right-sided abdominal pain and fever. Patient has had intermittent pain for the last week. She has mild tenderness to palpation. No significant pocket of ascites on bedside ultrasound. Patient may have a small amount that is not appreciable on the mind bedside study. Given her pain and fever I  plan for CT scan of the abdomen and pelvis with IV contrast for better characterization of the patient's abdominal pain and possible fever source. We'll give IV fluids. Lactate elevated at 3. No leukocytosis. Febrile at the outpatient office but afebrile here.   10:39 PM CT scan with trace ascites fluid. No other clear reason for the patient's pain. She has a fever earlier today and lactate of 3. Plan to admit for IV fluids and lactate trending. I started ceftriaxone to cover SBP.   Discussed patient's case with hospitalist, Dr. Loleta Books.  Recommend admission to med-surg, obs bed.  I will place holding orders per their request. Patient and family (if present) updated with plan. Care transferred to hospitalist service.  I reviewed all nursing notes, vitals, pertinent old records, EKGs, labs, imaging (as available).  ____________________________________________  FINAL CLINICAL IMPRESSION(S) / ED DIAGNOSES  Final diagnoses:  Right lower quadrant abdominal pain  Elevated lactic acid level     MEDICATIONS GIVEN DURING THIS VISIT:  Medications  cefTRIAXone (ROCEPHIN) 1 g in dextrose 5 % 50 mL IVPB (not administered)  sodium chloride 0.9 % bolus 500 mL (0 mLs Intravenous Stopped 07/11/16 2214)  iopamidol (ISOVUE-300) 61 % injection (80 mLs  Contrast Given 07/11/16 2142)  morphine 4 MG/ML injection 4 mg (4 mg Intravenous Given 07/11/16 2213)     NEW OUTPATIENT MEDICATIONS STARTED DURING THIS VISIT:  None   Note:  This document was prepared using Dragon voice recognition software and may include unintentional dictation errors.  Nanda Quinton, MD Emergency Medicine   Margette Fast, MD 07/11/16 2258

## 2016-07-11 NOTE — ED Notes (Signed)
IV team staff member at bedside at this time.

## 2016-07-11 NOTE — Progress Notes (Signed)
Subjective:    Patient ID: [name], female    DOB: March 20, 1946, 70 y.o.   MRN: [id number]  HPI  Patient presents to the office for evaluation of right sided abdominal pain which has radiated to her back.  She has been having pain there off and on for the last couple months but it got signficantly worse yesterday.  She reports that the pain is sharp and stabbing and radiates to her back.  She reports that the pain is constant.  She reports that she screamed this morning when it woke her up.  She reports that it really hurts when she takes a deep inspiration.  She has not been coughing.  She has not had any aspirations.  She reports that currently has an 8/10 pain.  She has not used the bathroom today.  She reports that her urinary habits are the same.  She reports that her last BM was yesterday.  It was soft and loose.  She reports that she always have dark stools due to iron intake.  She has a fever today.  She does not think she had one yesterday.  She is having some nausea.  She usually has some nausea with lactulose but she does not vomit.  She reports that her breathing has been doing okay.     Review of Systems  Constitutional: Positive for chills, fatigue and fever.  Respiratory: Negative for chest tightness, shortness of breath and wheezing.   Cardiovascular: Negative for chest pain, palpitations and leg swelling.  Gastrointestinal: Positive for abdominal distention, abdominal pain, diarrhea and nausea. Negative for anal bleeding, constipation and vomiting.  Genitourinary: Negative for dysuria, frequency, hematuria, urgency, vaginal bleeding, vaginal discharge and vaginal pain.  Musculoskeletal: Positive for back pain.  Neurological: Negative for dizziness, weakness and numbness.  Psychiatric/Behavioral: Negative for confusion and hallucinations.       Objective:   Physical Exam  Constitutional: She is oriented to person, place, and time. She has a sickly appearance. No  distress.  Mildly clammy to the touch  HENT:  Head: Normocephalic.  Mouth/Throat: Oropharynx is clear and moist. No oropharyngeal exudate.  Eyes: Conjunctivae are normal. No scleral icterus.  Neck: Normal range of motion. Neck supple. No JVD present. No thyromegaly present.  Cardiovascular: Normal rate.  Exam reveals no gallop and no friction rub.   No murmur heard. Pulmonary/Chest: Effort normal and breath sounds normal. No respiratory distress. She has no wheezes. She has no rales. She exhibits no tenderness.  Pulse O2 [place] in place.  Currently on 3L  Abdominal: Bowel sounds are normal. She exhibits distension. She exhibits no mass. There is generalized tenderness. There is guarding. There is no rigidity, no rebound, no tenderness at McBurney's point and negative Murphy's sign.  There is some abdominal distension on examination.  Patient cried out with gentle palpation of the bilateral lower quadrants.  She is actively guarding in the RUQ.    Musculoskeletal: Normal range of motion.  Lymphadenopathy:    She has no cervical adenopathy.  Neurological: She is alert and oriented to person, place, and time.  Skin: Skin is warm and dry. She is not diaphoretic.  Psychiatric: She has a normal mood and affect. Her behavior is normal. Judgment and thought content normal.  Nursing note and vitals reviewed.   Vitals:   07/11/16 1417  BP: 114/60  Pulse: 64  Resp: 16  Temp: (!) 101.2 F (38.4 C)          Assessment &  Plan:    1. RUQ pain -severe abdominal pain with active guarding in the office.  Given history of ascites, febrile, there is real concern for SBP.   Will need blood work.  Differential includes SBP, HCAP, Pyelonephritis, and less likely pancreatitis.  Patient is S/p cholescystectomy and appendectomy so no concern for this.  Will send to the ER for further evaluation.  Given recent kyphoplasty suggest blood culture, UA, Urine culture, CBC with Diff, CMET, CXR, and possible CT  abdomen and pelvis and possible peritoneal tap if warrented.  Patient aware and ER form is sent.  Patient will likely need pain control as well.

## 2016-07-11 NOTE — ED Notes (Signed)
Patient transported to CT via stretcher.

## 2016-07-12 ENCOUNTER — Observation Stay (HOSPITAL_COMMUNITY): Payer: PPO

## 2016-07-12 ENCOUNTER — Encounter (HOSPITAL_COMMUNITY): Payer: Self-pay | Admitting: Family Medicine

## 2016-07-12 DIAGNOSIS — Z9981 Dependence on supplemental oxygen: Secondary | ICD-10-CM | POA: Diagnosis not present

## 2016-07-12 DIAGNOSIS — D638 Anemia in other chronic diseases classified elsewhere: Secondary | ICD-10-CM | POA: Diagnosis not present

## 2016-07-12 DIAGNOSIS — K7460 Unspecified cirrhosis of liver: Secondary | ICD-10-CM | POA: Diagnosis not present

## 2016-07-12 DIAGNOSIS — E872 Acidosis: Principal | ICD-10-CM

## 2016-07-12 DIAGNOSIS — K766 Portal hypertension: Secondary | ICD-10-CM | POA: Diagnosis not present

## 2016-07-12 DIAGNOSIS — Z833 Family history of diabetes mellitus: Secondary | ICD-10-CM | POA: Diagnosis not present

## 2016-07-12 DIAGNOSIS — I8500 Esophageal varices without bleeding: Secondary | ICD-10-CM | POA: Diagnosis not present

## 2016-07-12 DIAGNOSIS — I129 Hypertensive chronic kidney disease with stage 1 through stage 4 chronic kidney disease, or unspecified chronic kidney disease: Secondary | ICD-10-CM | POA: Diagnosis not present

## 2016-07-12 DIAGNOSIS — D696 Thrombocytopenia, unspecified: Secondary | ICD-10-CM

## 2016-07-12 DIAGNOSIS — M797 Fibromyalgia: Secondary | ICD-10-CM | POA: Diagnosis not present

## 2016-07-12 DIAGNOSIS — E1122 Type 2 diabetes mellitus with diabetic chronic kidney disease: Secondary | ICD-10-CM | POA: Diagnosis not present

## 2016-07-12 DIAGNOSIS — R740 Nonspecific elevation of levels of transaminase and lactic acid dehydrogenase [LDH]: Secondary | ICD-10-CM | POA: Diagnosis not present

## 2016-07-12 DIAGNOSIS — E876 Hypokalemia: Secondary | ICD-10-CM | POA: Diagnosis not present

## 2016-07-12 DIAGNOSIS — N183 Chronic kidney disease, stage 3 (moderate): Secondary | ICD-10-CM | POA: Diagnosis not present

## 2016-07-12 DIAGNOSIS — N390 Urinary tract infection, site not specified: Secondary | ICD-10-CM | POA: Diagnosis not present

## 2016-07-12 DIAGNOSIS — Z888 Allergy status to other drugs, medicaments and biological substances status: Secondary | ICD-10-CM | POA: Diagnosis not present

## 2016-07-12 DIAGNOSIS — J449 Chronic obstructive pulmonary disease, unspecified: Secondary | ICD-10-CM | POA: Diagnosis not present

## 2016-07-12 DIAGNOSIS — R1031 Right lower quadrant pain: Secondary | ICD-10-CM | POA: Diagnosis not present

## 2016-07-12 DIAGNOSIS — Z882 Allergy status to sulfonamides status: Secondary | ICD-10-CM | POA: Diagnosis not present

## 2016-07-12 DIAGNOSIS — R52 Pain, unspecified: Secondary | ICD-10-CM | POA: Diagnosis not present

## 2016-07-12 DIAGNOSIS — K589 Irritable bowel syndrome without diarrhea: Secondary | ICD-10-CM | POA: Diagnosis not present

## 2016-07-12 DIAGNOSIS — E785 Hyperlipidemia, unspecified: Secondary | ICD-10-CM | POA: Diagnosis not present

## 2016-07-12 DIAGNOSIS — Z87891 Personal history of nicotine dependence: Secondary | ICD-10-CM | POA: Diagnosis not present

## 2016-07-12 DIAGNOSIS — K7581 Nonalcoholic steatohepatitis (NASH): Secondary | ICD-10-CM

## 2016-07-12 DIAGNOSIS — T501X5A Adverse effect of loop [high-ceiling] diuretics, initial encounter: Secondary | ICD-10-CM | POA: Diagnosis not present

## 2016-07-12 DIAGNOSIS — E669 Obesity, unspecified: Secondary | ICD-10-CM | POA: Diagnosis not present

## 2016-07-12 LAB — COMPREHENSIVE METABOLIC PANEL
ALBUMIN: 3 g/dL — AB (ref 3.5–5.0)
ALK PHOS: 137 U/L — AB (ref 38–126)
ALT: 18 U/L (ref 14–54)
ANION GAP: 11 (ref 5–15)
AST: 41 U/L (ref 15–41)
BILIRUBIN TOTAL: 1.1 mg/dL (ref 0.3–1.2)
BUN: 13 mg/dL (ref 6–20)
CALCIUM: 8.7 mg/dL — AB (ref 8.9–10.3)
CO2: 29 mmol/L (ref 22–32)
Chloride: 97 mmol/L — ABNORMAL LOW (ref 101–111)
Creatinine, Ser: 1.16 mg/dL — ABNORMAL HIGH (ref 0.44–1.00)
GFR calc Af Amer: 54 mL/min — ABNORMAL LOW (ref >60)
GFR calc non Af Amer: 47 mL/min — ABNORMAL LOW (ref >60)
GLUCOSE: 120 mg/dL — AB (ref 65–99)
Potassium: 3.2 mmol/L — ABNORMAL LOW (ref 3.5–5.1)
SODIUM: 137 mmol/L (ref 135–145)
Total Protein: 5.3 g/dL — ABNORMAL LOW (ref 6.5–8.1)

## 2016-07-12 LAB — CBC
HCT: 32.6 % — ABNORMAL LOW (ref 36.0–46.0)
HEMOGLOBIN: 10.5 g/dL — AB (ref 12.0–15.0)
MCH: 31.7 pg (ref 26.0–34.0)
MCHC: 32.2 g/dL (ref 30.0–36.0)
MCV: 98.5 fL (ref 78.0–100.0)
Platelets: 96 10*3/uL — ABNORMAL LOW (ref 150–400)
RBC: 3.31 MIL/uL — ABNORMAL LOW (ref 3.87–5.11)
RDW: 15.2 % (ref 11.5–15.5)
WBC: 4.8 10*3/uL (ref 4.0–10.5)

## 2016-07-12 LAB — LACTIC ACID, PLASMA
Lactic Acid, Venous: 2.3 mmol/L (ref 0.5–1.9)
Lactic Acid, Venous: 2.1 mmol/L (ref 0.5–1.9)

## 2016-07-12 MED ORDER — LACTULOSE 10 GM/15ML PO SOLN
20.0000 g | Freq: Two times a day (BID) | ORAL | Status: DC
  Administered 2016-07-12 – 2016-07-13 (×4): 20 g via ORAL
  Filled 2016-07-12 (×4): qty 30

## 2016-07-12 MED ORDER — RIFAXIMIN 550 MG PO TABS
550.0000 mg | ORAL_TABLET | Freq: Every day | ORAL | Status: DC | PRN
  Filled 2016-07-12: qty 1

## 2016-07-12 MED ORDER — MAGNESIUM OXIDE 400 (241.3 MG) MG PO TABS
200.0000 mg | ORAL_TABLET | Freq: Every day | ORAL | Status: DC
  Administered 2016-07-12 (×2): 200 mg via ORAL
  Filled 2016-07-12 (×2): qty 1

## 2016-07-12 MED ORDER — ACETAMINOPHEN 325 MG PO TABS
650.0000 mg | ORAL_TABLET | Freq: Four times a day (QID) | ORAL | Status: DC | PRN
  Administered 2016-07-12: 650 mg via ORAL
  Filled 2016-07-12: qty 2

## 2016-07-12 MED ORDER — ALLOPURINOL 300 MG PO TABS
300.0000 mg | ORAL_TABLET | Freq: Every day | ORAL | Status: DC
  Administered 2016-07-12 – 2016-07-13 (×2): 300 mg via ORAL
  Filled 2016-07-12 (×2): qty 1

## 2016-07-12 MED ORDER — ASPIRIN 81 MG PO CHEW
81.0000 mg | CHEWABLE_TABLET | Freq: Every day | ORAL | Status: DC
  Administered 2016-07-12 (×2): 81 mg via ORAL
  Filled 2016-07-12 (×2): qty 1

## 2016-07-12 MED ORDER — SODIUM CHLORIDE 0.9 % IV SOLN
INTRAVENOUS | Status: DC
  Administered 2016-07-12: 01:00:00 via INTRAVENOUS
  Administered 2016-07-13: 125 mL/h via INTRAVENOUS
  Administered 2016-07-13: 10:00:00 via INTRAVENOUS

## 2016-07-12 MED ORDER — ORAL CARE MOUTH RINSE
15.0000 mL | Freq: Two times a day (BID) | OROMUCOSAL | Status: DC
  Administered 2016-07-12 – 2016-07-13 (×2): 15 mL via OROMUCOSAL

## 2016-07-12 MED ORDER — POTASSIUM CHLORIDE CRYS ER 10 MEQ PO TBCR
60.0000 meq | EXTENDED_RELEASE_TABLET | Freq: Every day | ORAL | Status: DC
  Administered 2016-07-12 – 2016-07-13 (×2): 60 meq via ORAL
  Filled 2016-07-12 (×2): qty 6

## 2016-07-12 MED ORDER — HYDROMORPHONE HCL 2 MG/ML IJ SOLN
1.0000 mg | INTRAMUSCULAR | Status: DC | PRN
  Administered 2016-07-12 (×2): 1 mg via INTRAVENOUS
  Filled 2016-07-12 (×2): qty 1

## 2016-07-12 MED ORDER — FERROUS SULFATE 325 (65 FE) MG PO TABS
325.0000 mg | ORAL_TABLET | Freq: Every day | ORAL | Status: DC
  Administered 2016-07-12 – 2016-07-13 (×2): 325 mg via ORAL
  Filled 2016-07-12 (×2): qty 1

## 2016-07-12 MED ORDER — POTASSIUM CHLORIDE CRYS ER 20 MEQ PO TBCR
20.0000 meq | EXTENDED_RELEASE_TABLET | Freq: Once | ORAL | Status: AC
  Administered 2016-07-12: 20 meq via ORAL
  Filled 2016-07-12: qty 1

## 2016-07-12 MED ORDER — FUROSEMIDE 40 MG PO TABS
80.0000 mg | ORAL_TABLET | Freq: Every day | ORAL | Status: DC
  Administered 2016-07-12 – 2016-07-13 (×2): 80 mg via ORAL
  Filled 2016-07-12 (×3): qty 2

## 2016-07-12 MED ORDER — ALPRAZOLAM 0.5 MG PO TABS
1.0000 mg | ORAL_TABLET | Freq: Every evening | ORAL | Status: DC | PRN
  Administered 2016-07-12 (×2): 1 mg via ORAL
  Filled 2016-07-12 (×2): qty 2

## 2016-07-12 MED ORDER — BISOPROLOL FUMARATE 10 MG PO TABS
10.0000 mg | ORAL_TABLET | Freq: Every day | ORAL | Status: DC
  Administered 2016-07-12 – 2016-07-13 (×2): 10 mg via ORAL
  Filled 2016-07-12 (×2): qty 1

## 2016-07-12 MED ORDER — ACETAMINOPHEN 650 MG RE SUPP: 650.0000 mg | Freq: Four times a day (QID) | RECTAL | Status: DC | PRN

## 2016-07-12 MED ORDER — DEXTROSE 5 % IV SOLN
2.0000 g | INTRAVENOUS | Status: DC
  Administered 2016-07-12: 2 g via INTRAVENOUS
  Filled 2016-07-12 (×2): qty 2

## 2016-07-12 MED ORDER — PANTOPRAZOLE SODIUM 40 MG PO TBEC
40.0000 mg | DELAYED_RELEASE_TABLET | Freq: Two times a day (BID) | ORAL | Status: DC
  Administered 2016-07-12 – 2016-07-13 (×4): 40 mg via ORAL
  Filled 2016-07-12 (×5): qty 1

## 2016-07-12 MED ORDER — FUROSEMIDE 40 MG PO TABS
40.0000 mg | ORAL_TABLET | Freq: Every day | ORAL | Status: DC
  Administered 2016-07-12: 40 mg via ORAL
  Filled 2016-07-12: qty 1

## 2016-07-12 MED ORDER — DEXTROSE 5 % IV SOLN
1.0000 g | Freq: Once | INTRAVENOUS | Status: AC
  Administered 2016-07-12: 1 g via INTRAVENOUS
  Filled 2016-07-12: qty 10

## 2016-07-12 NOTE — H&P (Signed)
History and Physical  Patient Name: [name]     DZH:[id number]    DOB: [birth year]    DOA: 07/11/2016 PCP: Alesia Richards, MD   Patient coming from: Home  Chief Complaint: Abdominal pain  HPI: [name] is a 70 y.o. female with a past medical history significant for NASH cirrhosis with varices, HTN, fibromyalgia, CKD, and COPD on chronic home O2 who presents with abdominal pain for 1 day.  The patient was in her usual state of health until this morning when she "woke up screaming" from new right sided abdominal pain.  This was a severe, stabbing, constant pain, in the right upper quadrant, radiating to the back.  She had some fever at home as well as nausea without vomiting, so she went to her PCP's office.  There they were concerned about SBP and referred her to the ER.  ED course: -Afebrile, heart rate 65, respirations and pulse ox normal on her home O2, BP 138/56 -Na 138, K 3.6, Cr 1.23 (baseline 1.2), WBC 5K, Hgb 11.3 -Lipase normal -Lactic acid initially 1.69 --> 3.0 -UA with bacteria, no pyuria or hematuria -CT abdomen showed cirrhosis with portal hypertension, possible gastritis, but no findings to explain her pain and no ascites -A bedside US was attempted to obtain diagnostic paracentesis, but no fluid collections were found -She was given ceftriaxone for SBP and TRH were asked to observe given lactic acidosis of unclear significance         ROS: Review of Systems  Constitutional: Positive for fever.  Gastrointestinal: Positive for abdominal pain. Negative for blood in stool, constipation, diarrhea, melena, nausea and vomiting.  Genitourinary: Negative for dysuria and hematuria.       Dark urine          Past Medical History:  Diagnosis Date  . Asthma   . COPD (chronic obstructive pulmonary disease) ([place])   . Esophageal varices ([place])   . Fibromyalgia   . Gastritis   . Hepatic cirrhosis ([place])   . Hepatic encephalopathy ([place])   .  Hyperlipidemia   . Hypertension   . IBS (irritable bowel syndrome)   . Splenomegaly   . Vitamin D deficiency     Past Surgical History:  Procedure Laterality Date  . ABDOMINAL HYSTERECTOMY    . BACK SURGERY    . CHOLECYSTECTOMY    . ESOPHAGOGASTRODUODENOSCOPY  07/16/2012   Procedure: ESOPHAGOGASTRODUODENOSCOPY (EGD);  Surgeon: Inda Castle, MD;  Location: [facility];  Service: Endoscopy;  Laterality: N/A;  . GASTRIC VARICES BANDING  07/16/2012   Procedure: GASTRIC VARICES BANDING;  Surgeon: Inda Castle, MD;  Location: [facility];  Service: Endoscopy;  Laterality: N/A;  . HIP ARTHROPLASTY Bilateral   . IR GENERIC HISTORICAL  06/25/2016   IR RADIOLOGIST EVAL & MGMT 06/25/2016 [facility]  . IR GENERIC HISTORICAL  06/27/2016   IR KYPHO LUMBAR INC FX REDUCE BONE BX UNI/BIL CANNULATION INC/IMAGING 06/27/2016 Luanne Bras, MD [facility]  . NECK SURGERY    . SHOULDER SURGERY Right     Social History: Patient lives with her husband.  The patient walks with a walker or more often uses a wheelchair.  She is a former smoker.      Allergies  Allergen Reactions  . Atorvastatin Other (See Comments)    Doesn't remember  . Diphenhydramine Hcl (Sleep) Hives  . Hydrocodone-Acetaminophen Other (See Comments)    Doesn't remember  . Lopid [Gemfibrozil] Other (See Comments)    Doesn't remember  .  Loratadine Hives  . Lorazepam Hives  . Simvastatin Other (See Comments)  . Sulfamethoxazole Hives  . Sulfonamide Derivatives Hives    Family history: family history includes Atrial fibrillation in her sister; Diabetes in her brother; Heart disease in her mother and sister.  Prior to Admission medications   Medication Sig Start Date End Date Taking? Authorizing Provider  albuterol (PROVENTIL HFA;VENTOLIN HFA) 108 (90 Base) MCG/ACT inhaler Inhale 2 puffs into the lungs every 6 (six) hours as needed for wheezing or shortness of breath. 05/27/16  Yes Kristen N Ward, DO  albuterol  (PROVENTIL) (2.5 MG/3ML) 0.083% nebulizer solution Take 2.5 mg by nebulization every 4 (four) hours as needed for wheezing or shortness of breath.  04/11/16  Yes Historical Provider, MD  allopurinol (ZYLOPRIM) 300 MG tablet take 1/2-1 tablet by mouth daily for gout Patient taking differently: TAKE 300 MG BY MOUTH DAILY 04/10/16  Yes Unk Pinto, MD  ALPRAZolam Duanne Moron) 1 MG tablet Take 1 mg by mouth at bedtime as needed for anxiety.   Yes Historical Provider, MD  aspirin 81 MG tablet Take 81 mg by mouth at bedtime.    Yes Historical Provider, MD  benzonatate (TESSALON) 100 MG capsule Take 100 mg by mouth daily as needed for cough.    Yes Historical Provider, MD  bisoprolol (ZEBETA) 10 MG tablet Take 1 tablet (10 mg total) by mouth daily. 09/13/15  Yes Unk Pinto, MD  calcitonin, salmon, (MIACALCIN/FORTICAL) 200 UNIT/ACT nasal spray Place 1 spray into alternate nostrils daily. 06/13/16  Yes Historical Provider, MD  citalopram (CELEXA) 40 MG tablet Take 1 tablet (40 mg total) by mouth 2 (two) times daily. 02/29/16  Yes Vicie Mutters, PA-C  cyclobenzaprine (FLEXERIL) 10 MG tablet Take 1 tablet (10 mg total) by mouth 3 (three) times daily as needed for muscle spasms. 03/06/16  Yes Unk Pinto, MD  ferrous sulfate 325 (65 FE) MG tablet Take 325 mg by mouth daily with breakfast.   Yes Historical Provider, MD  furosemide (LASIX) 80 MG tablet Take 80 mg in the morning and one-half tablet (40 mg) in the evenings 03/06/16  Yes Vicie Mutters, PA-C  ipratropium (ATROVENT) 0.02 % nebulizer solution use 1 vial in nebulizer EVERY 4 HOURS AS NEEDED FOR WHEEZING OR SHORTNESS OF BREATH 05/31/16  Yes Vicie Mutters, PA-C  lactulose (CHRONULAC) 10 GM/15ML solution Take 30 ml (1 ounce) 2 x day Patient taking differently: Take 20 g by mouth 2 (two) times daily.  10/05/15 10/04/16 Yes Unk Pinto, MD  Magnesium 250 MG TABS Take 250 mg by mouth at bedtime.    Yes Historical Provider, MD  ondansetron (ZOFRAN) 4 MG tablet  Take 1 tablet (4 mg total) by mouth daily as needed for nausea or vomiting. 10/06/15 10/05/16 Yes Vicie Mutters, PA-C  OXYGEN Inhale 3 L/min into the lungs continuous.    Yes Historical Provider, MD  pantoprazole (PROTONIX) 40 MG tablet Take 1 tablet (40 mg total) by mouth 2 (two) times daily. 06/25/16  Yes Unk Pinto, MD  potassium chloride (K-DUR) 10 MEQ tablet Take 6 tablets (60 mEq total) by mouth daily. Patient taking differently: Take 2 tablets (40 mEq total) by mouth three time daily. 06/26/16  Yes Unk Pinto, MD  rifaximin (XIFAXAN) 550 MG TABS tablet Take 550 mg by mouth daily as needed (IBS/diarrhea symptoms).    Yes Historical Provider, MD  traMADol (ULTRAM) 50 MG tablet take 1 TABLET BY MOUTH EVERY 8 HOURS Patient taking differently: TAKE 50 MG BY MOUTH EVERY 8 HOURS  AS NEEDED FOR PAIN 12/28/15  Yes Courtney Forcucci, PA-C  triamcinolone cream (KENALOG) 0.1 % Apply 1 application topically 3 (three) times daily as needed for itching. 03/22/16  Yes Historical Provider, MD  gabapentin (NEURONTIN) 300 MG capsule take 1 capsule by mouth in the morning, 1 capsule in the afternoon and 1-3 capsules at night Patient not taking: Reported on 07/11/2016 05/30/16   Vicie Mutters, PA-C  tamoxifen (NOLVADEX) 20 MG tablet Take 1 tablet (20 mg total) by mouth daily. Patient not taking: Reported on 07/11/2016 11/15/15   Unk Pinto, MD  terconazole (TERAZOL 7) 0.4 % vaginal cream Place 1 applicator vaginally at bedtime. Patient not taking: Reported on 07/11/2016 06/18/16   Vicie Mutters, PA-C       Physical Exam: BP 121/65   Pulse 60   Temp 98.1 F (36.7 C) (Oral)   Resp 20   SpO2 100%  General appearance: Obese elderly adult female, alert and in moderate distress from pain.   Eyes: Anicteric, conjunctiva pink, lids and lashes normal. PERRL.    ENT: No nasal deformity, discharge, epistaxis.  Hearing normal. OP moist without lesions.   Neck: No neck masses.  Trachea midline.  No  thyromegaly/tenderness. Lymph: No cervical or supraclavicular lymphadenopathy. Skin: Warm and diaphoretic.  No jaundice.  No suspicious rashes or lesions. Cardiac: RRR, nl S1-S2, no murmurs appreciated.  Capillary refill is brisk.  JVP not visible.  No LE edema.  Radial and DP pulses 2+ and symmetric. Respiratory: Normal respiratory rate and rhythm.  CTAB without rales or wheezes. Abdomen: Abdomen with moderate to severe TTP on the epigastrium and right side. Voluntary guarding limits exam. Abdomen somewhat distended.  No ascites. MSK: No deformities or effusions.  No cyanosis or clubbing. Neuro: Cranial nerves normal.  Sensation intact to light touch. Speech is fluent.  Muscle strength normal.    Psych: Sensorium intact and responding to questions, attention normal.  Behavior appropriate.  Affect anxious.  Judgment and insight appear normal.     Labs on Admission:  I have personally reviewed following labs and imaging studies: CBC:  Recent Labs Lab 07/11/16 1538  WBC 5.0  NEUTROABS 3.0  HGB 11.3*  HCT 35.3*  MCV 99.2  PLT 98*   Basic Metabolic Panel:  Recent Labs Lab 07/11/16 1538  NA 138  K 3.6  CL 94*  CO2 33*  GLUCOSE 94  BUN 13  CREATININE 1.23*  CALCIUM 9.3   GFR: CrCl cannot be calculated (Unknown ideal weight.).  Liver Function Tests:  Recent Labs Lab 07/11/16 1538  AST 39  ALT 18  ALKPHOS 146*  BILITOT 1.0  PROT 5.8*  ALBUMIN 3.3*    Recent Labs Lab 07/11/16 1538  LIPASE 36   No results for input(s): AMMONIA in the last 168 hours. Coagulation Profile:  Recent Labs Lab 07/11/16 1934  INR 1.09   Cardiac Enzymes: No results for input(s): CKTOTAL, CKMB, CKMBINDEX, TROPONINI in the last 168 hours. BNP (last 3 results) No results for input(s): PROBNP in the last 8760 hours. HbA1C: No results for input(s): HGBA1C in the last 72 hours. CBG: No results for input(s): GLUCAP in the last 168 hours. Lipid Profile: No results for input(s):  CHOL, HDL, LDLCALC, TRIG, CHOLHDL, LDLDIRECT in the last 72 hours. Thyroid Function Tests: No results for input(s): TSH, T4TOTAL, FREET4, T3FREE, THYROIDAB in the last 72 hours. Anemia Panel: No results for input(s): VITAMINB12, FOLATE, FERRITIN, TIBC, IRON, RETICCTPCT in the last 72 hours. Sepsis Labs: Lactic acid 1.6 -->  3.0 --> 2.3 Invalid input(s): PROCALCITONIN, LACTICIDVEN No results found for this or any previous visit (from the past 240 hour(s)).       Radiological Exams on Admission: Personally reviewed CT report: Ct Abdomen Pelvis W Contrast  Result Date: 07/11/2016 CLINICAL DATA:  Right-sided abdominal pain. Nausea. Symptoms for several weeks. EXAM: CT ABDOMEN AND PELVIS WITH CONTRAST TECHNIQUE: Multidetector CT imaging of the abdomen and pelvis was performed using the standard protocol following bolus administration of intravenous contrast. CONTRAST:  80 cc Isovue 300 IV COMPARISON:  06/11/2016, MRI 05/25/2015 FINDINGS: Lower chest: Chronic lingular and right middle lobe atelectasis. No pleural fluid, previous left pleural effusion has resolved. Hepatobiliary: Post cholecystectomy. Cirrhotic hepatic morphology with nodular contours and left lobe enlargement. Diffusely decreased density. Tiny 5 mm hypodensity within segment 6 is too small to accurately characterize but unchanged from prior MRI and consistent with cyst. No biliary dilatation. Pancreas: No ductal dilatation or inflammation. Spleen: Enlarged measuring 16.5 x 8.2 x 13.4 cm (volume = 950 cm^3). No focal splenic lesion. Adrenals/Urinary Tract: Chronic right renal pelvis dilatation with ureteral decompression. Again seen nonobstructing right renal stone and parenchymal calcification. Bilateral renal cysts are unchanged. Urinary bladder is physiologically distended. Stomach/Bowel: Stomach is decompressed with equivocal gastric wall thickening. No small or large bowel dilatation. Mild submucosal fatty infiltration involving the  cecum and ascending colon suggesting chronic inflammation, no acute inflammatory change at this time. Appendix is not visualized. There is sigmoid colonic tortuosity. Vascular/Lymphatic: Perisplenic varices are again seen small paraesophageal varices. There is recannulization of the umbilical vein with periumbilical and anterior abdominal wall varices. Additional portosystemic collaterals noted within the abdomen. Atherosclerosis of the abdominal aorta without aneurysm. Small porta hepatis nodes may be reactive. Portal vein is patent. Reproductive: Status post hysterectomy. No adnexal masses. Streak artifact from hip prostheses limit pelvic evaluation. Other: Similar mesenteric edema.  Trace ascites in the pelvis. Musculoskeletal: Enteral vertebral augmentation of L1. Posterior fusion L4 through S1. Bilateral hip prosthesis in place. IMPRESSION: 1. Cirrhosis with portal hypertension, varices and splenomegaly. This appears unchanged from recent prior exam. Trace pelvic ascites. 2. Equivocal gastric wall thickening, can be seen with gastritis. 3. Resolved left pleural effusion. 4. Additional stable chronic findings. Electronically Signed   By: Jeb Levering M.D.   On: 07/11/2016 22:14           Assessment/Plan  1. Fever and lactic acidosis:  Diagnosis somewhat unclear.  Does not have fever, leukocytosis, tachycardia to suggest sepsis.  Reports fever at home, and appears diaphoretic and uncomfortable, so will treat with antibiotics for now.    -Follow cultures -Ceftriaxone for presumed UTI -Check mesenteric duplex to rule out mesenteric ischemia -Trend lactic acid with fluids  2. Cirrhosis with varices:  -Continue furosemide -Continue lactulose, rifaximin  3. HTN:  -Continue bisoprolol  4. COPD:  Clinically inactive  5. CKD:  Stable at baseline  6. Other medications:  -Continue allopurinol -Continue alprazolam -Continue iron  7. Thrombocytopenia: Stable, from  cirrhosis.        DVT prophylaxis: SCds  Code Status: FULL  Family Communication: Husband at bedside  Disposition Plan: Anticipate IV fluids and antibiotics and trend lactic acid.  Check mesenteric duplex. FOllow cultures. Consults called: None Admission status: OBS       Medical decision making: Patient seen at 11:40 AM on 07/11/2016.  The patient was discussed with Dr. Laverta Baltimore.  What exists of the patient's chart was reviewed in depth and summarized above.  Clinical condition: appaers hemodynamically stable  and stable from respiratory standpoint.        Edwin Dada [facility] Pager [phone]

## 2016-07-12 NOTE — Progress Notes (Signed)
Pharmacy Antibiotic Note  [name] is a 70 y.o. female admitted on 07/11/2016 with lactic acidosis w/ concern for urosepsis.  Pharmacy has been consulted for Rocephin dosing.  Plan: Rocephin 2g IV Q24H; will change to 1g Q24H if sepsis r/o.   Temp (24hrs), Avg:99.1 F (37.3 C), Min:98.1 F (36.7 C), Max:101.2 F (38.4 C)   Recent Labs Lab 07/11/16 1538 07/11/16 1554 07/11/16 1949  WBC 5.0  --   --   CREATININE 1.23*  --   --   LATICACIDVEN  --  1.69 3.04*     Allergies  Allergen Reactions  . Atorvastatin Other (See Comments)    Doesn't remember  . Diphenhydramine Hcl (Sleep) Hives  . Hydrocodone-Acetaminophen Other (See Comments)    Doesn't remember  . Lopid [Gemfibrozil] Other (See Comments)    Doesn't remember  . Loratadine Hives  . Lorazepam Hives  . Simvastatin Other (See Comments)  . Sulfamethoxazole Hives  . Sulfonamide Derivatives Hives     Thank you for allowing pharmacy to be a part of this patient's care.  Wynona Neat, PharmD, BCPS  07/12/2016 12:44 AM

## 2016-07-12 NOTE — Progress Notes (Signed)
Patient ID: [name], female   DOB: 04/25/1946, 70 y.o.   MRN: [id number]  PROGRESS NOTE    [name]  BPZ:[id number] DOB: 02-07-1946 DOA: 07/11/2016  PCP: Alesia Richards, MD   Brief Narrative:  70 year old female with past medical history significant for NASH cirrhosis with varices, HTN, fibromyalgia, CKD, COPD on chronic home O2 who presented with abdominal pain and pain along the right side of the rib cage. Patient reported severe, stabbing and constant pain, 10 out of 10 in intensity and radiating onto the back. Patient was hemodynamically stable on the admission. Her creatinine was 1.23 which is at baseline value of 1.2, lipase was within normal limits, lactic acid was 1.69 and then 3. Urinalysis showed bacteria but no pyuria or hematuria. CT abdomen showed cirrhosis with portal hypertension, possible gastritis but no other findings to explain her pain. At the bedside, ultrasound was attempted to obtain diagnostic paracentesis but no fluid collection was found. She was started on empiric ceftriaxone for possible SBP.   Assessment & Plan:   Abdominal pain / right side rib cage pain - Unclear etiology - CT abdomen showed cirrhosis, portal hypertension and possible gastritis but no other findings to explain patient's pain - We'll continue supportive care with analgesia as needed - Treat with Rocephin for possible SBP  NASH  / Cirrhosis with varices - Continue bisoprolol 10 mg daily, Lasix 80 mg daily and 40 mg at bedtime - Continue lactulose 20 mg twice a day - Continue Protonix twice daily  Lactic acidosis - Possibly related to liver cirrhosis, SBP - Lactic acid slowly trending down  Hypokalemia - Secondary to Lasix - Continue to supplement - Follow-up BMP tomorrow morning  Thrombocytopenia - Secondary to bone marrow suppression from liver cirrhosis - Platelets 96 - Monitor daily CBC  Anemia of chronic disease - Related to bone marrow suppression from  liver cirrhosis - Hemoglobin stable at 10.5  Chronic kidney disease, stage III - Baseline creatinine 1.27 in November 2017 - Creatinine on this admission within baseline values   DVT prophylaxis: SCDs bilaterally Code Status: full code  Family Communication: Husband at the bedside Disposition Plan: home tomorrow if pain controlled    Consultants:   None   Procedures:   None  Antimicrobials:   Rocephin 07/11/2016 -->   Subjective: Pain on the right side of the rib cage.    Objective: Vitals:   07/12/16 0000 07/12/16 0040 07/12/16 0158 07/12/16 1352  BP: 121/65 (!) 129/52  (!) 96/33  Pulse: 60 67  (!) 52  Resp:  17  16  Temp:  98 F (36.7 C)  97.5 F (36.4 C)  TempSrc:  Oral    SpO2: 100% 96%  100%  Weight:   81.1 kg (178 lb 11.2 oz)   Height:   5' 2"  (1.575 m)     Intake/Output Summary (Last 24 hours) at 07/12/16 1908 Last data filed at 07/12/16 1300  Gross per 24 hour  Intake          2273.02 ml  Output              800 ml  Net          1473.02 ml   Filed Weights   07/12/16 0158  Weight: 81.1 kg (178 lb 11.2 oz)    Examination:  General exam: Appears calm and comfortable  Respiratory system: Clear to auscultation. Respiratory effort normal. Cardiovascular system: S1 & S2 heard, RRR.  Gastrointestinal system: Abdomen is  nondistended, soft and nontender. No organomegaly or masses felt. Normal bowel sounds heard. Central nervous system: Alert and oriented. No focal neurological deficits. Extremities: Symmetric 5 x 5 power. Skin: No rashes, lesions or ulcers Psychiatry: Judgement and insight appear normal. Mood & affect appropriate.   Data Reviewed: I have personally reviewed following labs and imaging studies  CBC:  Recent Labs Lab 07/11/16 1538 07/12/16 0424  WBC 5.0 4.8  NEUTROABS 3.0  --   HGB 11.3* 10.5*  HCT 35.3* 32.6*  MCV 99.2 98.5  PLT 98* 96*   Basic Metabolic Panel:  Recent Labs Lab 07/11/16 1538 07/12/16 0424  NA 138 137   K 3.6 3.2*  CL 94* 97*  CO2 33* 29  GLUCOSE 94 120*  BUN 13 13  CREATININE 1.23* 1.16*  CALCIUM 9.3 8.7*   GFR: Estimated Creatinine Clearance: 45.2 mL/min (by C-G formula based on SCr of 1.16 mg/dL (H)). Liver Function Tests:  Recent Labs Lab 07/11/16 1538 07/12/16 0424  AST 39 41  ALT 18 18  ALKPHOS 146* 137*  BILITOT 1.0 1.1  PROT 5.8* 5.3*  ALBUMIN 3.3* 3.0*    Recent Labs Lab 07/11/16 1538  LIPASE 36   No results for input(s): AMMONIA in the last 168 hours. Coagulation Profile:  Recent Labs Lab 07/11/16 1934  INR 1.09   Cardiac Enzymes: No results for input(s): CKTOTAL, CKMB, CKMBINDEX, TROPONINI in the last 168 hours. BNP (last 3 results) No results for input(s): PROBNP in the last 8760 hours. HbA1C: No results for input(s): HGBA1C in the last 72 hours. CBG: No results for input(s): GLUCAP in the last 168 hours. Lipid Profile: No results for input(s): CHOL, HDL, LDLCALC, TRIG, CHOLHDL, LDLDIRECT in the last 72 hours. Thyroid Function Tests: No results for input(s): TSH, T4TOTAL, FREET4, T3FREE, THYROIDAB in the last 72 hours. Anemia Panel: No results for input(s): VITAMINB12, FOLATE, FERRITIN, TIBC, IRON, RETICCTPCT in the last 72 hours. Urine analysis:    Component Value Date/Time   COLORURINE YELLOW 07/11/2016 2114   APPEARANCEUR HAZY (A) 07/11/2016 2114   LABSPEC 1.005 07/11/2016 2114   PHURINE 7.0 07/11/2016 2114   GLUCOSEU NEGATIVE 07/11/2016 2114   HGBUR SMALL (A) 07/11/2016 2114   BILIRUBINUR NEGATIVE 07/11/2016 2114   KETONESUR NEGATIVE 07/11/2016 2114   PROTEINUR NEGATIVE 07/11/2016 2114   UROBILINOGEN 1 01/13/2015 1145   NITRITE NEGATIVE 07/11/2016 2114   LEUKOCYTESUR NEGATIVE 07/11/2016 2114   Sepsis Labs: @LABRCNTIP (procalcitonin:4,lacticidven:4)   )No results found for this or any previous visit (from the past 240 hour(s)).    Radiology Studies: Ct Abdomen Pelvis W Contrast Result Date: 07/11/2016 1. Cirrhosis with  portal hypertension, varices and splenomegaly. This appears unchanged from recent prior exam. Trace pelvic ascites. 2. Equivocal gastric wall thickening, can be seen with gastritis. 3. Resolved left pleural effusion. 4. Additional stable chronic findings. Electronically Signed   By: Jeb Levering M.D.   On: 07/11/2016 22:14      Scheduled Meds: . allopurinol  300 mg Oral Daily  . aspirin  81 mg Oral QHS  . bisoprolol  10 mg Oral Daily  . cefTRIAXone (ROCEPHIN)  IV  2 g Intravenous Q24H  . ferrous sulfate  325 mg Oral Q breakfast  . furosemide  40 mg Oral q1800  . furosemide  80 mg Oral Daily  . lactulose  20 g Oral BID  . magnesium oxide  200 mg Oral QHS  . mouth rinse  15 mL Mouth Rinse BID  . pantoprazole  40 mg  Oral BID  . potassium chloride  60 mEq Oral Daily   Continuous Infusions: . sodium chloride 125 mL/hr at 07/12/16 0117     LOS: 0 days    Time spent: 25 minutes  Greater than 50% of the time spent on counseling and coordinating the care.   Leisa Lenz, MD [facility] Pager [phone]  If 7PM-7AM, please contact night-coverage [email or web address] Password [email or web address] 07/12/2016, 7:08 PM

## 2016-07-12 NOTE — Progress Notes (Signed)
Preliminary results by tech - Mesenteric Duplex Completed. The SMA appeared open and patent without significant stenosis. The Celiac artery is patent with mildly elevated velocities in the proximal segment suggestive of equal to or greater than 50% stenosis. The inferior mesenteric artery was not clearly visualized due to patient's body habitus.  [name], BS, RDMS, RVT

## 2016-07-12 NOTE — Care Management Obs Status (Signed)
[place] NOTIFICATION   Patient Details  Name: [name] MRN: [id number] Date of Birth: 01/06/1946   Medicare Observation Status Notification Given:  Yes    Ninfa Meeker, RN 07/12/2016, 12:41 PM

## 2016-07-13 DIAGNOSIS — N183 Chronic kidney disease, stage 3 (moderate): Secondary | ICD-10-CM

## 2016-07-13 LAB — LACTIC ACID, PLASMA: Lactic Acid, Venous: 1.3 mmol/L (ref 0.5–1.9)

## 2016-07-13 LAB — URINE CULTURE: Special Requests: NORMAL

## 2016-07-13 LAB — CBC
HEMATOCRIT: 33.6 % — AB (ref 36.0–46.0)
Hemoglobin: 10.4 g/dL — ABNORMAL LOW (ref 12.0–15.0)
MCH: 32 pg (ref 26.0–34.0)
MCHC: 31 g/dL (ref 30.0–36.0)
MCV: 103.4 fL — AB (ref 78.0–100.0)
Platelets: 76 10*3/uL — ABNORMAL LOW (ref 150–400)
RBC: 3.25 MIL/uL — AB (ref 3.87–5.11)
RDW: 15.4 % (ref 11.5–15.5)
WBC: 3.6 10*3/uL — AB (ref 4.0–10.5)

## 2016-07-13 LAB — BASIC METABOLIC PANEL
Anion gap: 10 (ref 5–15)
BUN: 11 mg/dL (ref 6–20)
CHLORIDE: 107 mmol/L (ref 101–111)
CO2: 26 mmol/L (ref 22–32)
CREATININE: 1.11 mg/dL — AB (ref 0.44–1.00)
Calcium: 8.5 mg/dL — ABNORMAL LOW (ref 8.9–10.3)
GFR calc Af Amer: 57 mL/min — ABNORMAL LOW (ref >60)
GFR calc non Af Amer: 49 mL/min — ABNORMAL LOW (ref >60)
Glucose, Bld: 115 mg/dL — ABNORMAL HIGH (ref 65–99)
POTASSIUM: 3.7 mmol/L (ref 3.5–5.1)
SODIUM: 143 mmol/L (ref 135–145)

## 2016-07-13 MED ORDER — CIPROFLOXACIN HCL 500 MG PO TABS: 500.0000 mg | ORAL_TABLET | Freq: Two times a day (BID) | ORAL | 0 refills | Status: DC

## 2016-07-13 NOTE — Progress Notes (Signed)
Patient and patient's spouse verbalized understanding of discharge instructions and were able to teach back information. Volunteer wheelchair service placed.IV removed.

## 2016-07-13 NOTE — Care Management (Signed)
Patient was active with [facility] prior to admission.

## 2016-07-13 NOTE — Discharge Instructions (Addendum)

## 2016-07-13 NOTE — Discharge Summary (Addendum)
Physician Discharge Summary  [name] NOB:[id number] DOB: 29-Oct-1945 DOA: 07/11/2016  PCP: Alesia Richards, MD  Admit date: 07/11/2016 Discharge date: 07/13/2016  Recommendations for Outpatient Follow-up:  1. Check CBC and BMP with PCP during next visit 2. Take Cipro for 7 days on discharge for possible SBP  Discharge Diagnoses:  Principal Problem:   Lactic acidosis Active Problems:   Hepatic cirrhosis (HCC)   Essential hypertension   CKD stage 3 due to type 2 diabetes mellitus (HCC)   COPD GOLD II with min reversiblity     Discharge Condition: stable, pt insisting on going home today; spoke with pt husband and he agrees pt can go home.   Diet recommendation: as tolerated   History of present illness:  70 year old female with past medical history significant for NASH cirrhosis with varices, HTN, fibromyalgia, CKD, COPD on chronic home O2who presented with abdominal pain and pain along the right side of the rib cage. Patient reported severe, stabbing and constant pain, 10 out of 10 in intensity and radiating onto the back. Patient was hemodynamically stable on the admission. Her creatinine was 1.23 which is at baseline value of 1.2, lipase was within normal limits, lactic acid was 1.69 and then 3. Urinalysis showed bacteria but no pyuria or hematuria. CT abdomen showed cirrhosis with portal hypertension, possible gastritis but no other findings to explain her pain. At the bedside, ultrasound was attempted to obtain diagnostic paracentesis but no fluid collection was found. She was started on empiric ceftriaxone for possible SBP.  Hospital Course:   Assessment & Plan:   Abdominal pain / right side rib cage pain - Unclear etiology, possible congestive hepatopathy  - CT abdomen showed cirrhosis, portal hypertension and possible gastritis but no other findings to explain patient's pain - Treated with Rocephin for possible SBP in hospital - Cipro for 7 days on discharge    NASH  / Cirrhosis with varices - Continue bisoprolol 10 mg daily, Lasix 80 mg daily and 40 mg at bedtime - Continue lactulose 20 mg twice a day - Continue Protonix twice daily  Lactic acidosis - Possibly related to liver cirrhosis, SBP - Lactic acid now WNL  Hypokalemia - Secondary to Lasix - Supplemented and WNL  Thrombocytopenia - Secondary to bone marrow suppression from liver cirrhosis - Platelets down to 76 this am - No bleeding - Pt insisting on going home today, no further blood work   Anemia of chronic disease - Related to bone marrow suppression from liver cirrhosis - Hemoglobin stable  Chronic kidney disease, stage III - Baseline creatinine 1.27 in November 2017 - Creatinine on this admission within baseline values   DVT prophylaxis: SCDs bilaterally Code Status: full code  Family Communication: Husband at the bedside    Consultants:   None   Procedures:   None  Antimicrobials:   Rocephin 07/11/2016 --> 07/13/2016   Signed:  Leisa Lenz, MD  [facility] 07/13/2016, 12:17 PM  Pager #: [phone]  Time spent in minutes: less than 30 minutes   Discharge Exam: Vitals:   07/13/16 0552 07/13/16 1206  BP: (!) 112/50 (!) 131/49  Pulse: 72   Resp: 16 18  Temp: 97.7 F (36.5 C) 98.5 F (36.9 C)   Vitals:   07/12/16 1352 07/12/16 2004 07/13/16 0552 07/13/16 1206  BP: (!) 96/33 (!) 116/49 (!) 112/50 (!) 131/49  Pulse: (!) 52 66 72   Resp: 16 17 16 18   Temp: 97.5 F (36.4 C) 98.2 F (36.8 C) 97.7 F (  36.5 C) 98.5 F (36.9 C)  TempSrc:  Oral Axillary Oral  SpO2: 100% 99% 93% 97%  Weight:      Height:        General: Pt is alert, follows commands appropriately, not in acute distress Cardiovascular: Regular rate and rhythm, S1/S2 +, no murmurs Respiratory: Clear to auscultation bilaterally, no wheezing, no crackles, no rhonchi Abdominal: Soft, non tender, non distended, bowel sounds +, no guarding Extremities: no  edema, no cyanosis, pulses palpable bilaterally DP and PT Neuro: Grossly nonfocal  Discharge Instructions  Discharge Instructions    Call MD for:  persistant nausea and vomiting    Complete by:  As directed    Call MD for:  redness, tenderness, or signs of infection (pain, swelling, redness, odor or green/yellow discharge around incision site)    Complete by:  As directed    Call MD for:  severe uncontrolled pain    Complete by:  As directed    Diet - low sodium heart healthy    Complete by:  As directed    Increase activity slowly    Complete by:  As directed        Medication List    TAKE these medications   albuterol (2.5 MG/3ML) 0.083% nebulizer solution Commonly known as:  PROVENTIL Take 2.5 mg by nebulization every 4 (four) hours as needed for wheezing or shortness of breath.   albuterol 108 (90 Base) MCG/ACT inhaler Commonly known as:  PROVENTIL HFA;VENTOLIN HFA Inhale 2 puffs into the lungs every 6 (six) hours as needed for wheezing or shortness of breath.   allopurinol 300 MG tablet Commonly known as:  ZYLOPRIM take 1/2-1 tablet by mouth daily for gout What changed:  See the new instructions.   ALPRAZolam 1 MG tablet Commonly known as:  XANAX Take 1 mg by mouth at bedtime as needed for anxiety.   aspirin 81 MG tablet Take 81 mg by mouth at bedtime.   benzonatate 100 MG capsule Commonly known as:  TESSALON Take 100 mg by mouth daily as needed for cough.   bisoprolol 10 MG tablet Commonly known as:  ZEBETA Take 1 tablet (10 mg total) by mouth daily.   calcitonin (salmon) 200 UNIT/ACT nasal spray Commonly known as:  MIACALCIN/FORTICAL Place 1 spray into alternate nostrils daily.   citalopram 40 MG tablet Commonly known as:  CELEXA Take 1 tablet (40 mg total) by mouth 2 (two) times daily.   cyclobenzaprine 10 MG tablet Commonly known as:  FLEXERIL Take 1 tablet (10 mg total) by mouth 3 (three) times daily as needed for muscle spasms.   ferrous  sulfate 325 (65 FE) MG tablet Take 325 mg by mouth daily with breakfast.   furosemide 80 MG tablet Commonly known as:  LASIX Take 80 mg in the morning and one-half tablet (40 mg) in the evenings   ipratropium 0.02 % nebulizer solution Commonly known as:  ATROVENT use 1 vial in nebulizer EVERY 4 HOURS AS NEEDED FOR WHEEZING OR SHORTNESS OF BREATH   lactulose 10 GM/15ML solution Commonly known as:  CHRONULAC Take 30 ml (1 ounce) 2 x day What changed:  how much to take  how to take this  when to take this  additional instructions   Magnesium 250 MG Tabs Take 250 mg by mouth at bedtime.   ondansetron 4 MG tablet Commonly known as:  ZOFRAN Take 1 tablet (4 mg total) by mouth daily as needed for nausea or vomiting.   OXYGEN Inhale 3  L/min into the lungs continuous.   pantoprazole 40 MG tablet Commonly known as:  PROTONIX Take 1 tablet (40 mg total) by mouth 2 (two) times daily.   potassium chloride 10 MEQ tablet Commonly known as:  K-DUR Take 6 tablets (60 mEq total) by mouth daily. What changed:  See the new instructions.   rifaximin 550 MG Tabs tablet Commonly known as:  XIFAXAN Take 550 mg by mouth daily as needed (IBS/diarrhea symptoms).   traMADol 50 MG tablet Commonly known as:  ULTRAM take 1 TABLET BY MOUTH EVERY 8 HOURS What changed:  See the new instructions.   triamcinolone cream 0.1 % Commonly known as:  KENALOG Apply 1 application topically 3 (three) times daily as needed for itching.      Follow-up Information    MCKEOWN,WILLIAM DAVID, MD. Schedule an appointment as soon as possible for a visit in 1 week(s).   Specialty:  Internal Medicine Contact information: [address] [place] [address] [phone]            The results of significant diagnostics from this hospitalization (including imaging, microbiology, ancillary and laboratory) are listed below for reference.    Significant Diagnostic Studies: Ct Abdomen  Pelvis W Contrast  Result Date: 07/11/2016 CLINICAL DATA:  Right-sided abdominal pain. Nausea. Symptoms for several weeks. EXAM: CT ABDOMEN AND PELVIS WITH CONTRAST TECHNIQUE: Multidetector CT imaging of the abdomen and pelvis was performed using the standard protocol following bolus administration of intravenous contrast. CONTRAST:  80 cc Isovue 300 IV COMPARISON:  06/11/2016, MRI 05/25/2015 FINDINGS: Lower chest: Chronic lingular and right middle lobe atelectasis. No pleural fluid, previous left pleural effusion has resolved. Hepatobiliary: Post cholecystectomy. Cirrhotic hepatic morphology with nodular contours and left lobe enlargement. Diffusely decreased density. Tiny 5 mm hypodensity within segment 6 is too small to accurately characterize but unchanged from prior MRI and consistent with cyst. No biliary dilatation. Pancreas: No ductal dilatation or inflammation. Spleen: Enlarged measuring 16.5 x 8.2 x 13.4 cm (volume = 950 cm^3). No focal splenic lesion. Adrenals/Urinary Tract: Chronic right renal pelvis dilatation with ureteral decompression. Again seen nonobstructing right renal stone and parenchymal calcification. Bilateral renal cysts are unchanged. Urinary bladder is physiologically distended. Stomach/Bowel: Stomach is decompressed with equivocal gastric wall thickening. No small or large bowel dilatation. Mild submucosal fatty infiltration involving the cecum and ascending colon suggesting chronic inflammation, no acute inflammatory change at this time. Appendix is not visualized. There is sigmoid colonic tortuosity. Vascular/Lymphatic: Perisplenic varices are again seen small paraesophageal varices. There is recannulization of the umbilical vein with periumbilical and anterior abdominal wall varices. Additional portosystemic collaterals noted within the abdomen. Atherosclerosis of the abdominal aorta without aneurysm. Small porta hepatis nodes may be reactive. Portal vein is patent. Reproductive:  Status post hysterectomy. No adnexal masses. Streak artifact from hip prostheses limit pelvic evaluation. Other: Similar mesenteric edema.  Trace ascites in the pelvis. Musculoskeletal: Enteral vertebral augmentation of L1. Posterior fusion L4 through S1. Bilateral hip prosthesis in place. IMPRESSION: 1. Cirrhosis with portal hypertension, varices and splenomegaly. This appears unchanged from recent prior exam. Trace pelvic ascites. 2. Equivocal gastric wall thickening, can be seen with gastritis. 3. Resolved left pleural effusion. 4. Additional stable chronic findings. Electronically Signed   By: Jeb Levering M.D.   On: 07/11/2016 22:14   Ir Kypho Lumbar Inc Fx Reduce Bone Bx Uni/bil Cannulation Inc/imaging  Result Date: 07/02/2016 INDICATION: Severe low back pain secondary to compression fracture at L1. EXAM: IR KYPHO VERTEBRAL LUMBAR AUGMENTATION  COMPARISON:  MRI scan the lumbosacral spine performed recently from an outside institution. MEDICATIONS: As antibiotic prophylaxis, Ancef 2 g IV was ordered pre-procedure and administered intravenously within 1 hour of incision. ANESTHESIA/SEDATION: Moderate (conscious) sedation was employed during this procedure. A total of Versed mg and Fentanyl mcg was administered intravenously. Moderate Sedation Time: 30 minutes. The patient's level of consciousness and vital signs were monitored continuously by radiology nursing throughout the procedure under my direct supervision. FLUOROSCOPY TIME:  Fluoroscopy Time: 10 minutes 52 seconds ( mGy) COMPLICATIONS: None immediate. PROCEDURE: Following a full explanation of the procedure along with the potential associated complications, an informed witnessed consent was obtained. The patient was placed prone on the fluoroscopic table. The skin overlying the lumbar region was then prepped and draped in the usual sterile fashion. The right pedicle at L1 was then infiltrated with 0.25% bupivacaine followed by the advancement of  an 11-gauge Jamshidi needle through the right pedicle into the posterior one-third at L1. This was then exchanged for a Kyphon advanced osteo introducer system comprised of a working cannula and a Kyphon osteo drill. This combination was then advanced over a Kyphon osteo bone pin until the tip of the Kyphon osteo drill was in the posterior third at L1. At this time, the bone pin was removed. In a medial trajectory, the combination was advanced until the tip of the working cannula was inside the posterior one-third at L1. The osteo drill was removed. Through the working cannula, a Kyphon inflatable bone tamp 20 x 3 was advanced and positioned with the distal marker 5 mm from the anterior aspect of L1. Crossing of the midline was seen on the AP projection. At this time, the balloon was expanded using contrast via a Kyphon inflation syringe device via microtubing. Inflations were continued until there was apposition with the superior endplate. At this time, methylmethacrylate mixture was reconstituted with Tobramycin in the Kyphon bone mixing device system. This was then loaded onto the Kyphon bone fillers. The balloon was deflated and removed followed by the instillation of 5 and half bone filler equivalents of methylmethacrylate mixture at L1 with excellent filling in the AP and lateral projections. No extravasation was noted in the disk spaces or posteriorly into the spinal canal. No epidural venous contamination was seen. The working cannula and the bone filler were then retrieved and removed. Hemostasis was achieved at the skin entry site. Patient tolerated procedure well. There were no acute complications. IMPRESSION: 1. Status post vertebral body augmentation using balloon kyphoplasty at L1 as described without event. Electronically Signed   By: Luanne Bras M.D.   On: 06/27/2016 12:47   Ir Radiologist Eval & Mgmt  Result Date: 06/26/2016 EXAM: NEW PATIENT OFFICE VISIT CHIEF COMPLAINT: Severe low back  pain secondary to compression fracture at L1. Current Pain Level: 1-10 HISTORY OF PRESENT ILLNESS: The patient is a 70 year old right-handed lady who has been referred for evaluation of pain relief due to compression fracture at L1. The patient is accompanied by her husband. Basically the patient reports symptoms emanating right after a fall on her buttocks in early October of this year. The pain was almost immediate. The pain has since been constant almost a 10 out of 10 without any pain medications. She has difficulty ambulating standing, sitting and even sleeping at night time. She denies any autonomic dysfunction of her bowel or bladder activities. She denies recent chills, fever or rigors. Denies any UTI-like symptoms of dysuria, hematuria or polyuria. She reports radiation of  achiness in her upper thighs but there is no radicular distribution of pain, numbness or paresthesias below her knees. Her gait has been severely hampered on account of the pain. She ambulates with a walker a few steps. However, she has to sit down for pain relief. Pain relief is also provided by taking pain killers especially cyclobenzaprine which is a muscle relaxant, gabapentin and tramadol. The patient has been using a walker even prior to her fall resulting in the fracture. According to her she last normally walked approximately 6-7 years ago. She states she does get breathless on walking on account of her COPD despite being on an oxygen tank most the time. She is unable to sleep at night mostly on account of her severe pain. She denies any recent difficulty breathing such as wheezing, coughing or spitting blood or phlegm. Past Medical History: Cirrhosis of the liver. Splenomegaly. Left impingement syndrome. Left knee pain. Bursitis left hip. Left shoulder pain. Injury to her right foot. Lumbar post laminectomy with fusion by neuro surgery. Leg swelling on the left. Previous history of left-sided sciatica. Past Surgical History:  Spinal fusion in the neck and lower back. Rotator cuff repair. Total hip replacement bilaterally. Previous spinal surgery, neck surgery, gallbladder surgery, breast biopsy bilaterally. Leg recirculation surgery. Hysterectomy complete. Medications: Calcitonin, levofloxacin, aspirin, magnesium gluconate, ferrous sulfate, spironolactone, amitriptyline, vitamin D3, cyclobenzaprine, tamoxifen, pantoprazole, tylenol with codeine, gabapentin, citalopram, stool softener, allopurinol, colchicine, Amitiza, benzonatate, Zofran, furosemide 80 mg a day, Lactulose, bisoprolol, tramadol. As needed medications: Alprazolam, Oxycodone. Allergies: Lopid, Vicodin, Lipitor, sulfamethoxazole, Ativan, Benadryl, Claritin, niacin. Social History: Married. Has 3 sons alive and well. Two siblings alive and well. Denies drinking alcohol or cigarette smoking or illicit drugs. Does not smoke. Family History: Mother deceased due to congestive heart failure. Brothers deceased at age 44 sepsis. Father deceased age 50 lung carcinoma. REVIEW OF SYSTEMS: As mentioned above. PHYSICAL EXAMINATION: Appears to be in severe pain on account pain in her low back region. Affect mildly depressed. Neurologically grossly intact. Station and gait not tested but patient in a wheelchair. Patient is severely tender overlying the L1 region on palpation of the back. ASSESSMENT AND PLAN: The patient's MRI scan the lumbosacral spine is not available at this time. The report is. This was performed at an outside institution which mentions an L1 fracture. She had a CT scan of the abdomen on 06/11/2016 which does depict the presence of a compression fracture at L1. Surgical fusion noted at L4-5 and S1. The option of vertebral body augmentation to prevent further collapse and to alleviate pain was reviewed in detail. The procedure, the risks, benefits were all reviewed. Questions were answered to their satisfaction. The patient and the family would like to proceed with the L1  vertebral body augmentation. The husband was asked to bring the MRI scan that was done recently. The patient has been asked to refrain from stooping, bending or lifting anything above 10 pounds and to ambulate only as tolerated with a walker. They were asked to call should they have any concerns or questions. The procedure of the vertebral body augmentation will be scheduled as soon as possible. Electronically Signed   By: Luanne Bras M.D.   On: 06/25/2016 18:41    Microbiology: Recent Results (from the past 240 hour(s))  Urine culture     Status: Abnormal   Collection Time: 07/11/16  9:14 PM  Result Value Ref Range Status   Specimen Description URINE, CLEAN CATCH  Final   Special Requests  Normal  Final   Culture MULTIPLE SPECIES PRESENT, SUGGEST RECOLLECTION (A)  Final   Report Status 07/13/2016 FINAL  Final  Culture, blood (routine x 2)     Status: None (Preliminary result)   Collection Time: 07/11/16 11:30 PM  Result Value Ref Range Status   Specimen Description BLOOD LEFT HAND  Final   Special Requests IN PEDIATRIC BOTTLE 1ML  Final   Culture NO GROWTH 1 DAY  Final   Report Status PENDING  Incomplete  Culture, blood (routine x 2)     Status: None (Preliminary result)   Collection Time: 07/11/16 11:37 PM  Result Value Ref Range Status   Specimen Description BLOOD RIGHT HAND  Final   Special Requests IN PEDIATRIC BOTTLE 1ML  Final   Culture NO GROWTH 1 DAY  Final   Report Status PENDING  Incomplete     Labs: Basic Metabolic Panel:  Recent Labs Lab 07/11/16 1538 07/12/16 0424 07/13/16 0533  NA 138 137 143  K 3.6 3.2* 3.7  CL 94* 97* 107  CO2 33* 29 26  GLUCOSE 94 120* 115*  BUN 13 13 11   CREATININE 1.23* 1.16* 1.11*  CALCIUM 9.3 8.7* 8.5*   Liver Function Tests:  Recent Labs Lab 07/11/16 1538 07/12/16 0424  AST 39 41  ALT 18 18  ALKPHOS 146* 137*  BILITOT 1.0 1.1  PROT 5.8* 5.3*  ALBUMIN 3.3* 3.0*    Recent Labs Lab 07/11/16 1538  LIPASE 36   No  results for input(s): AMMONIA in the last 168 hours. CBC:  Recent Labs Lab 07/11/16 1538 07/12/16 0424 07/13/16 0533  WBC 5.0 4.8 3.6*  NEUTROABS 3.0  --   --   HGB 11.3* 10.5* 10.4*  HCT 35.3* 32.6* 33.6*  MCV 99.2 98.5 103.4*  PLT 98* 96* 76*   Cardiac Enzymes: No results for input(s): CKTOTAL, CKMB, CKMBINDEX, TROPONINI in the last 168 hours. BNP: BNP (last 3 results)  Recent Labs  07/26/15 1300 09/21/15 1650 01/19/16 0820  BNP 58.8 27.1 72.2    ProBNP (last 3 results) No results for input(s): PROBNP in the last 8760 hours.  CBG: No results for input(s): GLUCAP in the last 168 hours.

## 2016-07-16 ENCOUNTER — Telehealth: Payer: Self-pay | Admitting: *Deleted

## 2016-07-16 NOTE — Telephone Encounter (Signed)
Admit Date: 07/11/2016 Discharge Date: 07/13/2016  Admission Dx: RLQ pain, elevated lactic acid   Patient was given Cipro 500 mg BID.  Patient denies needing any further instructions or clarification on her medication instructions.  Patient very clear on how to take Rx.  Patient denies needing any further clarification on her discharge instructions.  Patient has husband at home to aid and assist her.  Transferred patient up front to schedule hospital f/u appt with Dr. Melford Aase this week.

## 2016-07-17 ENCOUNTER — Ambulatory Visit (HOSPITAL_COMMUNITY)
Admission: RE | Admit: 2016-07-17 | Discharge: 2016-07-17 | Disposition: A | Discharge status: Home / Self Care | Payer: PPO | Source: Ambulatory Visit | Attending: Interventional Radiology | Admitting: Interventional Radiology

## 2016-07-17 DIAGNOSIS — J9621 Acute and chronic respiratory failure with hypoxia: Secondary | ICD-10-CM | POA: Diagnosis not present

## 2016-07-17 DIAGNOSIS — IMO0001 Reserved for inherently not codable concepts without codable children: Secondary | ICD-10-CM

## 2016-07-17 DIAGNOSIS — M6281 Muscle weakness (generalized): Secondary | ICD-10-CM | POA: Diagnosis not present

## 2016-07-17 DIAGNOSIS — Z7982 Long term (current) use of aspirin: Secondary | ICD-10-CM | POA: Diagnosis not present

## 2016-07-17 DIAGNOSIS — N183 Chronic kidney disease, stage 3 (moderate): Secondary | ICD-10-CM | POA: Diagnosis not present

## 2016-07-17 DIAGNOSIS — K7581 Nonalcoholic steatohepatitis (NASH): Secondary | ICD-10-CM | POA: Diagnosis not present

## 2016-07-17 DIAGNOSIS — R261 Paralytic gait: Secondary | ICD-10-CM | POA: Diagnosis not present

## 2016-07-17 DIAGNOSIS — J449 Chronic obstructive pulmonary disease, unspecified: Secondary | ICD-10-CM | POA: Diagnosis not present

## 2016-07-17 DIAGNOSIS — I129 Hypertensive chronic kidney disease with stage 1 through stage 4 chronic kidney disease, or unspecified chronic kidney disease: Secondary | ICD-10-CM | POA: Diagnosis not present

## 2016-07-17 DIAGNOSIS — S32000S Wedge compression fracture of unspecified lumbar vertebra, sequela: Secondary | ICD-10-CM | POA: Diagnosis not present

## 2016-07-17 DIAGNOSIS — E1165 Type 2 diabetes mellitus with hyperglycemia: Secondary | ICD-10-CM | POA: Diagnosis not present

## 2016-07-17 DIAGNOSIS — M4850XS Collapsed vertebra, not elsewhere classified, site unspecified, sequela of fracture: Principal | ICD-10-CM

## 2016-07-17 DIAGNOSIS — E1122 Type 2 diabetes mellitus with diabetic chronic kidney disease: Secondary | ICD-10-CM | POA: Diagnosis not present

## 2016-07-17 HISTORY — PX: IR GENERIC HISTORICAL: IMG1180011

## 2016-07-17 LAB — CULTURE, BLOOD (ROUTINE X 2)
Culture: NO GROWTH
Culture: NO GROWTH

## 2016-07-18 ENCOUNTER — Encounter (HOSPITAL_COMMUNITY): Payer: Self-pay | Admitting: Interventional Radiology

## 2016-07-18 ENCOUNTER — Telehealth: Payer: Self-pay | Admitting: *Deleted

## 2016-07-18 NOTE — Telephone Encounter (Signed)
Pete from [facility] called .  He requested an order to re-certify the patient for physical therapy following her hospital discharge.  He also reported that 2 of the patient's medications have possible interactions with her Cipro RX.  Per Dr Melford Aase, it is OK for PT and she can continue to take the Cipro. Laurey Arrow is aware.

## 2016-07-19 ENCOUNTER — Ambulatory Visit (INDEPENDENT_AMBULATORY_CARE_PROVIDER_SITE_OTHER): Payer: PPO | Admitting: Internal Medicine

## 2016-07-19 ENCOUNTER — Encounter: Payer: Self-pay | Admitting: Internal Medicine

## 2016-07-19 VITALS — BP 126/58 | HR 80 | Temp 97.7°F | Resp 16 | Ht 62.0 in | Wt 174.0 lb

## 2016-07-19 DIAGNOSIS — K7460 Unspecified cirrhosis of liver: Secondary | ICD-10-CM

## 2016-07-19 DIAGNOSIS — K7581 Nonalcoholic steatohepatitis (NASH): Secondary | ICD-10-CM

## 2016-07-19 DIAGNOSIS — K652 Spontaneous bacterial peritonitis: Secondary | ICD-10-CM

## 2016-07-19 DIAGNOSIS — J449 Chronic obstructive pulmonary disease, unspecified: Secondary | ICD-10-CM | POA: Diagnosis not present

## 2016-07-19 DIAGNOSIS — Z79899 Other long term (current) drug therapy: Secondary | ICD-10-CM

## 2016-07-19 LAB — CBC WITH DIFFERENTIAL/PLATELET
BASOS PCT: 0 %
Basophils Absolute: 0 cells/uL (ref 0–200)
Eosinophils Absolute: 598 cells/uL — ABNORMAL HIGH (ref 15–500)
Eosinophils Relative: 13 %
HCT: 33.8 % — ABNORMAL LOW (ref 35.0–45.0)
Hemoglobin: 10.9 g/dL — ABNORMAL LOW (ref 11.7–15.5)
LYMPHS PCT: 21 %
Lymphs Abs: 966 cells/uL (ref 850–3900)
MCH: 31.6 pg (ref 27.0–33.0)
MCHC: 32.2 g/dL (ref 32.0–36.0)
MCV: 98 fL (ref 80.0–100.0)
MONO ABS: 230 {cells}/uL (ref 200–950)
MONOS PCT: 5 %
MPV: 11.4 fL (ref 7.5–12.5)
Neutro Abs: 2806 cells/uL (ref 1500–7800)
Neutrophils Relative %: 61 %
PLATELETS: 80 10*3/uL — AB (ref 140–400)
RBC: 3.45 MIL/uL — AB (ref 3.80–5.10)
RDW: 15.3 % — AB (ref 11.0–15.0)
WBC: 4.6 10*3/uL (ref 3.8–10.8)

## 2016-07-19 LAB — BASIC METABOLIC PANEL WITH GFR
BUN: 10 mg/dL (ref 7–25)
CO2: 34 mmol/L — ABNORMAL HIGH (ref 20–31)
Calcium: 9 mg/dL (ref 8.6–10.4)
Chloride: 101 mmol/L (ref 98–110)
Creat: 1.11 mg/dL — ABNORMAL HIGH (ref 0.50–0.99)
GFR, EST AFRICAN AMERICAN: 59 mL/min — AB (ref >=60)
GFR, EST NON AFRICAN AMERICAN: 51 mL/min — AB (ref >=60)
Glucose, Bld: 132 mg/dL — ABNORMAL HIGH (ref 65–99)
POTASSIUM: 4.2 mmol/L (ref 3.5–5.3)
SODIUM: 142 mmol/L (ref 135–146)

## 2016-07-19 LAB — AMMONIA: AMMONIA: 42 umol/L (ref <=47)

## 2016-07-19 LAB — PROTIME-INR
INR: 1
PROTHROMBIN TIME: 11.1 s (ref 9.0–11.5)

## 2016-07-19 NOTE — Progress Notes (Signed)
[facility] Unk Pinto, M.D.        Uvaldo Bristle. Silverio Lay, P.A.-C       Starlyn Skeans, P.A.-C  [facility]                [address] [place], [place] [address] Telephone [phone] Telefax [phone] ______________________________________________________________________     This very nice 70 y.o.female presents for post hospital  follow up having been incarcerated 12/6-8/2017with acute abdominal pain suspected SBP and she receive IV Rocephin in the hospital and was discharged on oral Cipro. Patient rapidly Improved in the hospital and has felt well at home the last 6 days w/o c/o abdominal pain , fever/chills, rash, N/V . Patient is follow=ed closely with NASH, Cirrhosis with portal HTN and esophageal varices. Spouse closely follows her weight for approximation of fluid balance to titrate her diuretics and also regulates her Lactulose to try to maintain daily diarrhea to avoid hepatic encephalopathy. Patient is also followed for HTN, HLD, COPD on O2,  Pre-Diabetes/ Insulin Resistance, GERD and Vitamin D Deficiency.      Patient is treated for HTN & BP has been controlled at home. Today's BP is 126/58. Patient has had no complaints of any cardiac type chest pain, palpitations, orthopnea/PND, dizziness, claudication, or dependent edema.     Hyperlipidemia is controlled with diet. Patient denies myalgias or other med SE's. Last Lipids were at goal albeit elevated Trig's: Lab Results  Component Value Date   CHOL 186 04/19/2016   HDL 30 (L) 04/19/2016   LDLCALC 85 04/19/2016   TRIG 356 (H) 04/19/2016   CHOLHDL 6.2 (H) 04/19/2016      Also, the patient has history of PreDiabetes/Injsulin Resistance and has had no symptoms of reactive hypoglycemia, diabetic polys, paresthesias or visual blurring.  Last A1c was not at goal: Lab Results  Component Value Date   HGBA1C 6.4 (H) 04/19/2016      Further, the  patient also has history of Vitamin D Deficiency and supplements vitamin D without any suspected side-effects. Last vitamin D was still low (goal 70-100) Lab Results  Component Value Date   VD25OH 42 04/19/2016   Current Outpatient Prescriptions on File Prior to Visit  Medication Sig  . albuterol (PROVENTIL HFA;VENTOLIN HFA) 108 (90 Base) MCG/ACT inhaler Inhale 2 puffs into the lungs every 6 (six) hours as needed for wheezing or shortness of breath.  Marland Kitchen albuterol (PROVENTIL) (2.5 MG/3ML) 0.083% nebulizer solution Take 2.5 mg by nebulization every 4 (four) hours as needed for wheezing or shortness of breath.   . allopurinol (ZYLOPRIM) 300 MG tablet take 1/2-1 tablet by mouth daily for gout (Patient taking differently: TAKE 300 MG BY MOUTH DAILY)  . ALPRAZolam (XANAX) 1 MG tablet Take 1 mg by mouth at bedtime as needed for anxiety.  Marland Kitchen aspirin 81 MG tablet Take 81 mg by mouth at bedtime.   . benzonatate (TESSALON) 100 MG capsule Take 100 mg by mouth daily as needed for cough.   . bisoprolol (ZEBETA) 10 MG tablet Take 1 tablet (10 mg total) by mouth daily.  . calcitonin, salmon, (MIACALCIN/FORTICAL) 200 UNIT/ACT nasal spray Place 1 spray into alternate nostrils daily.  . ciprofloxacin (CIPRO) 500 MG tablet Take 1 tablet (500 mg total) by mouth 2 (two) times daily.  . citalopram (CELEXA) 40 MG tablet Take 1 tablet (40 mg total) by mouth 2 (two) times  daily.  . cyclobenzaprine (FLEXERIL) 10 MG tablet Take 1 tablet (10 mg total) by mouth 3 (three) times daily as needed for muscle spasms.  . ferrous sulfate 325 (65 FE) MG tablet Take 325 mg by mouth daily with breakfast.  . furosemide (LASIX) 80 MG tablet Take 80 mg in the morning and one-half tablet (40 mg) in the evenings  . ipratropium (ATROVENT) 0.02 % nebulizer solution use 1 vial in nebulizer EVERY 4 HOURS AS NEEDED FOR WHEEZING OR SHORTNESS OF BREATH  . lactulose (CHRONULAC) 10 GM/15ML solution Take 30 ml (1 ounce) 2 x day (Patient taking  differently: Take 20 g by mouth 2 (two) times daily. )  . Magnesium 250 MG TABS Take 250 mg by mouth at bedtime.   . ondansetron (ZOFRAN) 4 MG tablet Take 1 tablet (4 mg total) by mouth daily as needed for nausea or vomiting.  . OXYGEN Inhale 3 L/min into the lungs continuous.   . pantoprazole (PROTONIX) 40 MG tablet Take 1 tablet (40 mg total) by mouth 2 (two) times daily.  . potassium chloride (K-DUR) 10 MEQ tablet Take 6 tablets (60 mEq total) by mouth daily. (Patient taking differently: Take 2 tablets (40 mEq total) by mouth three time daily.)  . rifaximin (XIFAXAN) 550 MG TABS tablet Take 550 mg by mouth daily as needed (IBS/diarrhea symptoms).   . traMADol (ULTRAM) 50 MG tablet take 1 TABLET BY MOUTH EVERY 8 HOURS (Patient taking differently: TAKE 50 MG BY MOUTH EVERY 8 HOURS AS NEEDED FOR PAIN)  . triamcinolone cream (KENALOG) 0.1 % Apply 1 application topically 3 (three) times daily as needed for itching.   No current facility-administered medications on file prior to visit.    Allergies  Allergen Reactions  . Atorvastatin Other (See Comments)    Doesn't remember  . Diphenhydramine Hcl (Sleep) Hives  . Hydrocodone-Acetaminophen Other (See Comments)    Doesn't remember  . Lopid [Gemfibrozil] Other (See Comments)    Doesn't remember  . Loratadine Hives  . Lorazepam Hives  . Simvastatin Other (See Comments)  . Sulfamethoxazole Hives  . Sulfonamide Derivatives Hives   PMHx:   Past Medical History:  Diagnosis Date  . Asthma   . COPD (chronic obstructive pulmonary disease) ([place])   . Esophageal varices ([place])   . Fibromyalgia   . Gastritis   . Hepatic cirrhosis ([place])   . Hepatic encephalopathy ([place])   . Hyperlipidemia   . Hypertension   . IBS (irritable bowel syndrome)   . Splenomegaly   . Vitamin D deficiency    Immunization History  Administered Date(s) Administered  . Hep A / Hep B 11/05/2013, 11/12/2013, 12/07/2013, 12/31/2014  . Hepatitis B, adult 12/19/2015  .  Hepatitis B, ped/adol 11/17/2015, 05/21/2016  . Influenza Split 05/26/2013, 06/17/2014  . Influenza, High Dose Seasonal PF 06/15/2015  . Influenza,inj,quad, With Preservative 05/22/2016  . Pneumococcal Conjugate-13 07/22/2015  . Pneumococcal Polysaccharide-23 08/28/2013  . Tdap 11/19/2012   Past Surgical History:  Procedure Laterality Date  . ABDOMINAL HYSTERECTOMY    . BACK SURGERY    . CHOLECYSTECTOMY    . ESOPHAGOGASTRODUODENOSCOPY  07/16/2012   Procedure: ESOPHAGOGASTRODUODENOSCOPY (EGD);  Surgeon: Inda Castle, MD;  Location: [facility];  Service: Endoscopy;  Laterality: N/A;  . GASTRIC VARICES BANDING  07/16/2012   Procedure: GASTRIC VARICES BANDING;  Surgeon: Inda Castle, MD;  Location: [facility];  Service: Endoscopy;  Laterality: N/A;  . HIP ARTHROPLASTY Bilateral   . IR GENERIC HISTORICAL  06/25/2016  IR RADIOLOGIST EVAL & MGMT 06/25/2016 [facility]  . IR GENERIC HISTORICAL  06/27/2016   IR KYPHO LUMBAR INC FX REDUCE BONE BX UNI/BIL CANNULATION INC/IMAGING 06/27/2016 Luanne Bras, MD [facility]  . IR GENERIC HISTORICAL  07/17/2016   IR RADIOLOGIST EVAL & MGMT 07/17/2016 [facility]  . NECK SURGERY    . SHOULDER SURGERY Right    FHx:    Reviewed / unchanged  SHx:    Reviewed / unchanged  Systems Review:  Constitutional: Denies fever, chills, wt changes, headaches, insomnia, fatigue, night sweats, change in appetite. Eyes: Denies redness, blurred vision, diplopia, discharge, itchy, watery eyes.  ENT: Denies discharge, congestion, post nasal drip, epistaxis, sore throat, earache, hearing loss, dental pain, tinnitus, vertigo, sinus pain, snoring.  CV: Denies chest pain, palpitations, irregular heartbeat, syncope, dyspnea, diaphoresis, orthopnea, PND, claudication or edema. Respiratory: denies cough, dyspnea, DOE, pleurisy, hoarseness, laryngitis, wheezing.  Gastrointestinal: Denies dysphagia, odynophagia, heartburn, reflux, water brash, abdominal  pain or cramps, nausea, vomiting, bloating, diarrhea, constipation, hematemesis, melena, hematochezia  or hemorrhoids. Genitourinary: Denies dysuria, frequency, urgency, nocturia, hesitancy, discharge, hematuria or flank pain. Musculoskeletal: Denies arthralgias, myalgias, stiffness, jt. swelling, pain, limping or strain/sprain.  Skin: Denies pruritus, rash, hives, warts, acne, eczema or change in skin lesion(s). Neuro: No weakness, tremor, incoordination, spasms, paresthesia or pain. Psychiatric: Denies confusion, memory loss or sensory loss. Endo: Denies change in weight, skin or hair change.  Heme/Lymph: No excessive bleeding, bruising or enlarged lymph nodes.  Physical Exam BP (!) 126/58   Pulse 80   Temp 97.7 F (36.5 C)   Resp 16   Ht 5' 2"  (1.575 m)   Wt 174 lb (78.9 kg)   BMI 31.83 kg/m   Appears over nourished, chronically ill on nasal O2 and in no distress.  Eyes: PERRLA, EOMs, conjunctiva no swelling or erythema. Sinuses: No frontal/maxillary tenderness ENT/Mouth: EAC's clear, TM's nl w/o erythema, bulging. Nares clear w/o erythema, swelling, exudates. Oropharynx clear without erythema or exudates. Oral hygiene is good. Tongue normal, non obstructing. Hearing intact.  Neck: Supple. Thyroid nl. Car 2+/2+ without bruits, nodes or JVD. Chest: Respirations nl with BS clear & equal w/o rales, rhonchi, wheezing or stridor.  Cor: Heart sounds normal w/ regular rate and rhythm without sig. murmurs, gallops, clicks, or rubs. Peripheral pulses normal and equal  without edema.  Abdomen: Soft, protuberant & bowel sounds normal. Non-tender w/o guarding, rebound, hernias, masses, or organomegaly.  Lymphatics: Unremarkable.  Musculoskeletal: Full ROM all peripheral extremities, joint stability, 5/5 strength, and normal gait.  Skin: Warm, dry without exposed rashes, lesions or ecchymosis apparent.  Neuro: Cranial nerves intact, reflexes equal bilaterally. Sensory-motor testing grossly  intact. Tendon reflexes grossly intact.  Pysch: Alert & oriented x 3.  Insight and judgement nl very limited.   Assessment and Plan:  1. SBP (spontaneous bacterial peritonitis) ([place])  - CBC with Differential/Platelet  2. Cirrhosis of liver without ascites ([place])   Reminder to go to the ER if any CP, SOB, nausea,  dizziness, severe HA, changes vision/speech, left arm / mental status. - Protime-INR - [facility] - Monitor appropriate labs.  3. NASH (nonalcoholic steatohepatitis)   4. Medication management  - CBC with Differential/Platelet - BASIC METABOLIC PANEL WITH GFR - Protime-INR       Recommended regular exercise, BP monitoring, weight control, and discussed med and SE's. Recommended labs to assess and monitor clinical status. Further disposition pending results of labs. Over 30 minutes of exam, counseling, chart review was performed

## 2016-07-19 NOTE — Patient Instructions (Addendum)
Cirrhosis Cirrhosis is long-term (chronic) liver injury. The liver is your largest internal organ, and it performs many functions. The liver converts food into energy, removes toxic material from your blood, makes important proteins, and absorbs necessary vitamins from your diet. If you have cirrhosis, it means many of your healthy liver cells have been replaced by scar tissue. This prevents blood from flowing through your liver, which makes it difficult for your liver to function. This scarring is not reversible, but treatment can prevent it from getting worse. What are the causes? Hepatitis C and long-term alcohol abuse are the most common causes of cirrhosis. Other causes include:  Nonalcoholic fatty liver disease.  Hepatitis B infection.  Autoimmune hepatitis.  Diseases that cause blockage of ducts inside the liver.  Inherited liver diseases.  Reactions to certain long-term medicines.  Parasitic infections.  Long-term exposure to certain toxins. What increases the risk? You may have a higher risk of cirrhosis if you:  Have certain hepatitis viruses.  Abuse alcohol, especially if you are female.  Are overweight.  Share needles.  Have unprotected sex with someone who has hepatitis. What are the signs or symptoms? You may not have any signs and symptoms at first. Symptoms may not develop until the damage to your liver starts to get worse. Signs and symptoms of cirrhosis may include:  Tenderness in the right-upper part of your abdomen.  Weakness and tiredness (fatigue).  Loss of appetite.  Nausea.  Weight loss and muscle loss.  Itchiness.  Yellow skin and eyes (jaundice).  Buildup of fluid in the abdomen (ascites).  Swelling of the feet and ankles (edema).  Appearance of tiny blood vessels under the skin.  Mental confusion.  Easy bruising and bleeding. How is this diagnosed? Your health care provider may suspect cirrhosis based on your symptoms and medical  history, especially if you have other medical conditions or a history of alcohol abuse. Your health care provider will do a physical exam to feel your liver and check for signs of cirrhosis. Your health care provider may perform other tests, including:  Blood tests to check:  Whether you have hepatitis B or C.  Kidney function.  Liver function.  Imaging tests such as:  MRI or CT scan to look for changes seen in advanced cirrhosis.  Ultrasound to see if normal liver tissue is being replaced by scar tissue.  A procedure using a long needle to take a sample of liver tissue (biopsy) for examination under a microscope. Liver biopsy can confirm the diagnosis of cirrhosis. How is this treated? Treatment depends on how damaged your liver is and what caused the damage. Treatment may include treating cirrhosis symptoms or treating the underlying causes of the condition to try to slow the progression of the damage. Treatment may include:  Making lifestyle changes, such as:  Eating a healthy diet.  Restricting salt intake.  Maintaining a healthy weight.  Not abusing drugs or alcohol.  Taking medicines to:  Treat liver infections or other infections.  Control itching.  Reduce fluid buildup.  Reduce certain blood toxins.  Reduce risk of bleeding from enlarged blood vessels in the stomach or esophagus (varices).  If varices are causing bleeding problems, you may need treatment with a procedure that ties up the vessels causing them to fall off (band ligation).  If cirrhosis is causing your liver to fail, your health care provider may recommend a liver transplant.  Other treatments may be recommended depending on any complications of cirrhosis, such as  liver-related kidney failure (hepatorenal syndrome). Follow these instructions at home:  Take medicines only as directed by your health care provider. Do not use drugs that are toxic to your liver. Ask your health care provider before  taking any new medicines, including over-the-counter medicines.  Rest as needed.  Eat a well-balanced diet. Ask your health care provider or dietitian for more information.  You may have to follow a low-salt diet or restrict your water intake as directed.  Do not drink alcohol. This is especially important if you are taking acetaminophen.  Keep all follow-up visits as directed by your health care provider. This is important. Contact a health care provider if:  You have fatigue or weakness that is getting worse.  You develop swelling of the hands, feet, legs, or face.  You have a fever.  You develop loss of appetite.  You have nausea or vomiting.  You develop jaundice.  You develop easy bruising or bleeding. Get help right away if:  You vomit bright red blood or a material that looks like coffee grounds.  You have blood in your stools.  Your stools appear black and tarry.  You become confused.  You have chest pain or trouble breathing. This information is not intended to replace advice given to you by your health care provider. Make sure you discuss any questions you have with your health care provider. Document Released: 07/23/2005 Document Revised: 12/01/2015 Document Reviewed: 03/31/2014 Elsevier Interactive Patient Education  2017 [employer] Inc.  ========================== Nonalcoholic Fatty Liver Disease Diet Introduction Nonalcoholic fatty liver disease is a condition that causes fat to accumulate in and around the liver. The disease makes it harder for the liver to work the way that it should. Following a healthy diet can help to keep nonalcoholic fatty liver disease under control. It can also help to prevent or improve conditions that are associated with the disease, such as heart disease, diabetes, high blood pressure, and abnormal cholesterol levels. Along with regular exercise, this diet:  Promotes weight loss.  Helps to control blood sugar levels.  Helps  to improve the way that the body uses insulin. What do I need to know about this diet?  Use the glycemic index (GI) to plan your meals. The index tells you how quickly a food will raise your blood sugar. Choose low-GI foods. These foods take a longer time to raise blood sugar.  Keep track of how many calories you take in. Eating the right amount of calories will help you to achieve a healthy weight.  You may want to follow a Mediterranean diet. This diet includes a lot of vegetables, lean meats or fish, whole grains, fruits, and healthy oils and fats. What foods can I eat? Grains  Whole grains, such as whole-wheat or whole-grain breads, crackers, tortillas, cereals, and pasta. Stone-ground whole wheat. Pumpernickel bread. Unsweetened oatmeal. Bulgur. Barley. Quinoa. Brown or wild rice. Corn or whole-wheat flour tortillas. Vegetables  Lettuce. Spinach. Peas. Beets. Cauliflower. Cabbage. Broccoli. Carrots. Tomatoes. Squash. Eggplant. Herbs. Peppers. Onions. Cucumbers. Brussels sprouts. Yams and sweet potatoes. Beans. Lentils. Fruits  Bananas. Apples. Oranges. Grapes. Papaya. Mango. Pomegranate. Kiwi. Grapefruit. Cherries. Meats and Other Protein Sources  Seafood and shellfish. Lean meats. Poultry. Tofu. Dairy  Low-fat or fat-free dairy products, such as yogurt, cottage cheese, and cheese. Beverages  Water. Sugar-free drinks. Tea. Coffee. Low-fat or skim milk. Milk alternatives, such as soy or almond milk. Real fruit juice. Condiments  Mustard. Relish. Low-fat, low-sugar ketchup and barbecue sauce. Low-fat or fat-free  mayonnaise. Sweets and Desserts  Sugar-free sweets. Fats and Oils  Avocado. Canola or olive oil. Nuts and nut butters. Seeds. The items listed above may not be a complete list of recommended foods or beverages. Contact your dietitian for more options.  What foods are not recommended? Palm oil and coconut oil. Processed foods. Fried foods. Sweetened drinks, such as sweet tea,  milkshakes, snow cones, iced sweet drinks, and sodas. Alcohol. Sweets. Foods that contain a lot of salt or sodium. The items listed above may not be a complete list of foods and beverages to avoid. Contact your dietitian for more information.  This information is not intended to replace advice given to you by your health care provider. Make sure you discuss any questions you have with your health care provider. Document Released: 12/07/2014 Document Revised: 12/29/2015 Document Reviewed: 08/17/2014  2017 Elsevier

## 2016-07-20 DIAGNOSIS — M6281 Muscle weakness (generalized): Secondary | ICD-10-CM | POA: Diagnosis not present

## 2016-07-20 DIAGNOSIS — N183 Chronic kidney disease, stage 3 (moderate): Secondary | ICD-10-CM | POA: Diagnosis not present

## 2016-07-20 DIAGNOSIS — J9621 Acute and chronic respiratory failure with hypoxia: Secondary | ICD-10-CM | POA: Diagnosis not present

## 2016-07-20 DIAGNOSIS — K7581 Nonalcoholic steatohepatitis (NASH): Secondary | ICD-10-CM | POA: Diagnosis not present

## 2016-07-20 DIAGNOSIS — R261 Paralytic gait: Secondary | ICD-10-CM | POA: Diagnosis not present

## 2016-07-20 DIAGNOSIS — E1122 Type 2 diabetes mellitus with diabetic chronic kidney disease: Secondary | ICD-10-CM | POA: Diagnosis not present

## 2016-07-20 DIAGNOSIS — I129 Hypertensive chronic kidney disease with stage 1 through stage 4 chronic kidney disease, or unspecified chronic kidney disease: Secondary | ICD-10-CM | POA: Diagnosis not present

## 2016-07-20 DIAGNOSIS — Z7982 Long term (current) use of aspirin: Secondary | ICD-10-CM | POA: Diagnosis not present

## 2016-07-20 DIAGNOSIS — J449 Chronic obstructive pulmonary disease, unspecified: Secondary | ICD-10-CM | POA: Diagnosis not present

## 2016-07-20 DIAGNOSIS — E1165 Type 2 diabetes mellitus with hyperglycemia: Secondary | ICD-10-CM | POA: Diagnosis not present

## 2016-07-21 DIAGNOSIS — J449 Chronic obstructive pulmonary disease, unspecified: Secondary | ICD-10-CM | POA: Diagnosis not present

## 2016-07-23 DIAGNOSIS — E1122 Type 2 diabetes mellitus with diabetic chronic kidney disease: Secondary | ICD-10-CM | POA: Diagnosis not present

## 2016-07-23 DIAGNOSIS — J9611 Chronic respiratory failure with hypoxia: Secondary | ICD-10-CM | POA: Diagnosis not present

## 2016-07-23 DIAGNOSIS — I129 Hypertensive chronic kidney disease with stage 1 through stage 4 chronic kidney disease, or unspecified chronic kidney disease: Secondary | ICD-10-CM | POA: Diagnosis not present

## 2016-07-23 DIAGNOSIS — R261 Paralytic gait: Secondary | ICD-10-CM | POA: Diagnosis not present

## 2016-07-24 ENCOUNTER — Telehealth: Payer: Self-pay | Admitting: *Deleted

## 2016-07-24 NOTE — Telephone Encounter (Signed)
Peter, from [facility], called and reported the patient will received physical therapy 1 time a week and 1 time every other week for 2 weeks.

## 2016-07-25 ENCOUNTER — Other Ambulatory Visit: Payer: Self-pay | Admitting: *Deleted

## 2016-07-25 MED ORDER — GABAPENTIN 300 MG PO CAPS
ORAL_CAPSULE | ORAL | 1 refills | Status: DC
Start: 2016-07-25 — End: 2016-10-01

## 2016-07-26 DIAGNOSIS — M6281 Muscle weakness (generalized): Secondary | ICD-10-CM | POA: Diagnosis not present

## 2016-07-26 DIAGNOSIS — J449 Chronic obstructive pulmonary disease, unspecified: Secondary | ICD-10-CM | POA: Diagnosis not present

## 2016-07-26 DIAGNOSIS — K7581 Nonalcoholic steatohepatitis (NASH): Secondary | ICD-10-CM | POA: Diagnosis not present

## 2016-07-26 DIAGNOSIS — E1165 Type 2 diabetes mellitus with hyperglycemia: Secondary | ICD-10-CM | POA: Diagnosis not present

## 2016-07-26 DIAGNOSIS — N183 Chronic kidney disease, stage 3 (moderate): Secondary | ICD-10-CM | POA: Diagnosis not present

## 2016-07-26 DIAGNOSIS — M545 Low back pain: Secondary | ICD-10-CM | POA: Diagnosis not present

## 2016-07-26 DIAGNOSIS — E1122 Type 2 diabetes mellitus with diabetic chronic kidney disease: Secondary | ICD-10-CM | POA: Diagnosis not present

## 2016-07-26 DIAGNOSIS — I129 Hypertensive chronic kidney disease with stage 1 through stage 4 chronic kidney disease, or unspecified chronic kidney disease: Secondary | ICD-10-CM | POA: Diagnosis not present

## 2016-07-26 DIAGNOSIS — R261 Paralytic gait: Secondary | ICD-10-CM | POA: Diagnosis not present

## 2016-07-26 DIAGNOSIS — J9611 Chronic respiratory failure with hypoxia: Secondary | ICD-10-CM | POA: Diagnosis not present

## 2016-07-27 ENCOUNTER — Ambulatory Visit: Payer: Self-pay | Admitting: Internal Medicine

## 2016-08-02 ENCOUNTER — Telehealth: Payer: Self-pay | Admitting: Internal Medicine

## 2016-08-02 NOTE — Telephone Encounter (Signed)
recvd fax auth for [facility] to [facility] for paralytic gait

## 2016-08-09 DIAGNOSIS — J449 Chronic obstructive pulmonary disease, unspecified: Secondary | ICD-10-CM | POA: Diagnosis not present

## 2016-08-09 DIAGNOSIS — E1122 Type 2 diabetes mellitus with diabetic chronic kidney disease: Secondary | ICD-10-CM | POA: Diagnosis not present

## 2016-08-09 DIAGNOSIS — R261 Paralytic gait: Secondary | ICD-10-CM | POA: Diagnosis not present

## 2016-08-09 DIAGNOSIS — J9611 Chronic respiratory failure with hypoxia: Secondary | ICD-10-CM | POA: Diagnosis not present

## 2016-08-09 DIAGNOSIS — K7581 Nonalcoholic steatohepatitis (NASH): Secondary | ICD-10-CM | POA: Diagnosis not present

## 2016-08-09 DIAGNOSIS — M6281 Muscle weakness (generalized): Secondary | ICD-10-CM | POA: Diagnosis not present

## 2016-08-09 DIAGNOSIS — E1165 Type 2 diabetes mellitus with hyperglycemia: Secondary | ICD-10-CM | POA: Diagnosis not present

## 2016-08-09 DIAGNOSIS — M545 Low back pain: Secondary | ICD-10-CM | POA: Diagnosis not present

## 2016-08-09 DIAGNOSIS — I129 Hypertensive chronic kidney disease with stage 1 through stage 4 chronic kidney disease, or unspecified chronic kidney disease: Secondary | ICD-10-CM | POA: Diagnosis not present

## 2016-08-09 DIAGNOSIS — N183 Chronic kidney disease, stage 3 (moderate): Secondary | ICD-10-CM | POA: Diagnosis not present

## 2016-08-17 DIAGNOSIS — R261 Paralytic gait: Secondary | ICD-10-CM | POA: Diagnosis not present

## 2016-08-17 DIAGNOSIS — K7581 Nonalcoholic steatohepatitis (NASH): Secondary | ICD-10-CM | POA: Diagnosis not present

## 2016-08-17 DIAGNOSIS — J449 Chronic obstructive pulmonary disease, unspecified: Secondary | ICD-10-CM | POA: Diagnosis not present

## 2016-08-17 DIAGNOSIS — M545 Low back pain: Secondary | ICD-10-CM | POA: Diagnosis not present

## 2016-08-17 DIAGNOSIS — E1165 Type 2 diabetes mellitus with hyperglycemia: Secondary | ICD-10-CM | POA: Diagnosis not present

## 2016-08-17 DIAGNOSIS — J9611 Chronic respiratory failure with hypoxia: Secondary | ICD-10-CM | POA: Diagnosis not present

## 2016-08-17 DIAGNOSIS — N183 Chronic kidney disease, stage 3 (moderate): Secondary | ICD-10-CM | POA: Diagnosis not present

## 2016-08-17 DIAGNOSIS — I129 Hypertensive chronic kidney disease with stage 1 through stage 4 chronic kidney disease, or unspecified chronic kidney disease: Secondary | ICD-10-CM | POA: Diagnosis not present

## 2016-08-17 DIAGNOSIS — M6281 Muscle weakness (generalized): Secondary | ICD-10-CM | POA: Diagnosis not present

## 2016-08-17 DIAGNOSIS — E1122 Type 2 diabetes mellitus with diabetic chronic kidney disease: Secondary | ICD-10-CM | POA: Diagnosis not present

## 2016-08-19 DIAGNOSIS — J449 Chronic obstructive pulmonary disease, unspecified: Secondary | ICD-10-CM | POA: Diagnosis not present

## 2016-08-20 ENCOUNTER — Other Ambulatory Visit: Payer: Self-pay | Admitting: *Deleted

## 2016-08-20 MED ORDER — IPRATROPIUM BROMIDE 0.02 % IN SOLN
RESPIRATORY_TRACT | 0 refills | Status: DC
Start: 2016-08-20 — End: 2016-10-14

## 2016-08-22 ENCOUNTER — Ambulatory Visit: Payer: Self-pay | Admitting: Physician Assistant

## 2016-08-29 ENCOUNTER — Ambulatory Visit (INDEPENDENT_AMBULATORY_CARE_PROVIDER_SITE_OTHER): Payer: PPO | Admitting: Physician Assistant

## 2016-08-29 ENCOUNTER — Encounter: Payer: Self-pay | Admitting: Physician Assistant

## 2016-08-29 VITALS — BP 110/66 | HR 76 | Temp 98.9°F | Resp 16 | Ht 62.0 in | Wt 174.0 lb

## 2016-08-29 DIAGNOSIS — E785 Hyperlipidemia, unspecified: Secondary | ICD-10-CM

## 2016-08-29 DIAGNOSIS — M546 Pain in thoracic spine: Secondary | ICD-10-CM

## 2016-08-29 DIAGNOSIS — R590 Localized enlarged lymph nodes: Secondary | ICD-10-CM | POA: Diagnosis not present

## 2016-08-29 DIAGNOSIS — M1000 Idiopathic gout, unspecified site: Secondary | ICD-10-CM | POA: Diagnosis not present

## 2016-08-29 DIAGNOSIS — Z79899 Other long term (current) drug therapy: Secondary | ICD-10-CM | POA: Diagnosis not present

## 2016-08-29 DIAGNOSIS — K652 Spontaneous bacterial peritonitis: Secondary | ICD-10-CM

## 2016-08-29 DIAGNOSIS — K7682 Hepatic encephalopathy: Secondary | ICD-10-CM

## 2016-08-29 DIAGNOSIS — D696 Thrombocytopenia, unspecified: Secondary | ICD-10-CM | POA: Diagnosis not present

## 2016-08-29 DIAGNOSIS — K7290 Hepatic failure, unspecified without coma: Secondary | ICD-10-CM | POA: Diagnosis not present

## 2016-08-29 DIAGNOSIS — E1122 Type 2 diabetes mellitus with diabetic chronic kidney disease: Secondary | ICD-10-CM | POA: Diagnosis not present

## 2016-08-29 DIAGNOSIS — I10 Essential (primary) hypertension: Secondary | ICD-10-CM

## 2016-08-29 DIAGNOSIS — N183 Chronic kidney disease, stage 3 (moderate): Secondary | ICD-10-CM | POA: Diagnosis not present

## 2016-08-29 DIAGNOSIS — J42 Unspecified chronic bronchitis: Secondary | ICD-10-CM | POA: Diagnosis not present

## 2016-08-29 DIAGNOSIS — K7460 Unspecified cirrhosis of liver: Secondary | ICD-10-CM

## 2016-08-29 DIAGNOSIS — H8113 Benign paroxysmal vertigo, bilateral: Secondary | ICD-10-CM

## 2016-08-29 LAB — CBC WITH DIFFERENTIAL/PLATELET
BASOS ABS: 40 {cells}/uL (ref 0–200)
BASOS PCT: 1 %
EOS PCT: 7 %
Eosinophils Absolute: 280 cells/uL (ref 15–500)
HCT: 31.3 % — ABNORMAL LOW (ref 35.0–45.0)
Hemoglobin: 10.3 g/dL — ABNORMAL LOW (ref 11.7–15.5)
Lymphocytes Relative: 21 %
Lymphs Abs: 840 cells/uL — ABNORMAL LOW (ref 850–3900)
MCH: 32.2 pg (ref 27.0–33.0)
MCHC: 32.9 g/dL (ref 32.0–36.0)
MCV: 97.8 fL (ref 80.0–100.0)
MONOS PCT: 6 %
MPV: 11.5 fL (ref 7.5–12.5)
Monocytes Absolute: 240 cells/uL (ref 200–950)
NEUTROS ABS: 2600 {cells}/uL (ref 1500–7800)
Neutrophils Relative %: 65 %
PLATELETS: 90 10*3/uL — AB (ref 140–400)
RBC: 3.2 MIL/uL — ABNORMAL LOW (ref 3.80–5.10)
RDW: 15.3 % — ABNORMAL HIGH (ref 11.0–15.0)
WBC: 4 10*3/uL (ref 3.8–10.8)

## 2016-08-29 LAB — LIPID PANEL
CHOL/HDL RATIO: 5.1 ratio — AB (ref <5.0)
Cholesterol: 183 mg/dL (ref <200)
HDL: 36 mg/dL — AB (ref >50)
LDL Cholesterol: 102 mg/dL — ABNORMAL HIGH (ref <100)
TRIGLYCERIDES: 226 mg/dL — AB (ref <150)
VLDL: 45 mg/dL — ABNORMAL HIGH (ref <30)

## 2016-08-29 LAB — BASIC METABOLIC PANEL WITH GFR
BUN: 14 mg/dL (ref 7–25)
CHLORIDE: 96 mmol/L — AB (ref 98–110)
CO2: 36 mmol/L — ABNORMAL HIGH (ref 20–31)
CREATININE: 1.14 mg/dL — AB (ref 0.50–0.99)
Calcium: 9.1 mg/dL (ref 8.6–10.4)
GFR, EST AFRICAN AMERICAN: 57 mL/min — AB (ref >=60)
GFR, Est Non African American: 49 mL/min — ABNORMAL LOW (ref >=60)
Glucose, Bld: 112 mg/dL — ABNORMAL HIGH (ref 65–99)
Potassium: 3.4 mmol/L — ABNORMAL LOW (ref 3.5–5.3)
SODIUM: 139 mmol/L (ref 135–146)

## 2016-08-29 LAB — HEPATIC FUNCTION PANEL
ALT: 14 U/L (ref 6–29)
AST: 31 U/L (ref 10–35)
Albumin: 3.2 g/dL — ABNORMAL LOW (ref 3.6–5.1)
Alkaline Phosphatase: 133 U/L — ABNORMAL HIGH (ref 33–130)
BILIRUBIN TOTAL: 1.1 mg/dL (ref 0.2–1.2)
Bilirubin, Direct: 0.3 mg/dL — ABNORMAL HIGH (ref <=0.2)
Indirect Bilirubin: 0.8 mg/dL (ref 0.2–1.2)
Total Protein: 5.4 g/dL — ABNORMAL LOW (ref 6.1–8.1)

## 2016-08-29 LAB — VITAMIN B12: Vitamin B-12: 631 pg/mL (ref 200–1100)

## 2016-08-29 LAB — MAGNESIUM: Magnesium: 2.1 mg/dL (ref 1.5–2.5)

## 2016-08-29 LAB — TSH: TSH: 1.04 m[IU]/L

## 2016-08-29 NOTE — Patient Instructions (Signed)
Dizziness Dizziness is a common problem. It is a feeling of unsteadiness or light-headedness. You may feel like you are about to faint. Dizziness can lead to injury if you stumble or fall. Anyone can become dizzy, but dizziness is more common in older adults. This condition can be caused by a number of things, including medicines, dehydration, or illness. Follow these instructions at home: Taking these steps may help with your condition: Eating and drinking  Drink enough fluid to keep your urine clear or pale yellow. This helps to keep you from becoming dehydrated. Try to drink more clear fluids, such as water.  Do not drink alcohol.  Limit your caffeine intake if directed by your health care provider.  Limit your salt intake if directed by your health care provider. Activity  Avoid making quick movements.  Rise slowly from chairs and steady yourself until you feel okay.  In the morning, first sit up on the side of the bed. When you feel okay, stand slowly while you hold onto something until you know that your balance is fine.  Move your legs often if you need to stand in one place for a long time. Tighten and relax your muscles in your legs while you are standing.  Do not drive or operate heavy machinery if you feel dizzy.  Avoid bending down if you feel dizzy. Place items in your home so that they are easy for you to reach without leaning over. Lifestyle  Do not use any tobacco products, including cigarettes, chewing tobacco, or electronic cigarettes. If you need help quitting, ask your health care provider.  Try to reduce your stress level, such as with yoga or meditation. Talk with your health care provider if you need help. General instructions  Watch your dizziness for any changes.  Take medicines only as directed by your health care provider. Talk with your health care provider if you think that your dizziness is caused by a medicine that you are taking.  Tell a friend or  a family member that you are feeling dizzy. If he or she notices any changes in your behavior, have this person call your health care provider.  Keep all follow-up visits as directed by your health care provider. This is important. Contact a health care provider if:  Your dizziness does not go away.  Your dizziness or light-headedness gets worse.  You feel nauseous.  You have reduced hearing.  You have new symptoms.  You are unsteady on your feet or you feel like the room is spinning. Get help right away if:  You vomit or have diarrhea and are unable to eat or drink anything.  You have problems talking, walking, swallowing, or using your arms, hands, or legs.  You feel generally weak.  You are not thinking clearly or you have trouble forming sentences. It may take a friend or family member to notice this.  You have chest pain, abdominal pain, shortness of breath, or sweating.  Your vision changes.  You notice any bleeding.  You have a headache.  You have neck pain or a stiff neck.  You have a fever. This information is not intended to replace advice given to you by your health care provider. Make sure you discuss any questions you have with your health care provider. Document Released: 01/16/2001 Document Revised: 12/29/2015 Document Reviewed: 07/19/2014 Elsevier Interactive Patient Education  2017 [employer].   Spinal Compression Fracture A spinal compression fracture is a collapse of the bones that form the  spine (vertebrae). With this type of fracture, the vertebrae become squashed (compressed) into a wedge shape. Most compression fractures happen in the middle or lower part of the spine. What are the causes? This condition may be caused by:  Thinning and loss of density in the bones (osteoporosis). This is the most common cause.  A fall.  A car or motorcycle accident.  Cancer.  Trauma, such as a heavy, direct hit to the head. What increases the  risk? You may be at greater risk for a spinal compression fracture if you:  Are 32 years old or older.  Have osteoporosis.  Have certain types of cancer, including:  Multiple myeloma.  Lymphoma.  Prostate cancer.  Lung cancer.  Breast cancer. What are the signs or symptoms? Symptoms of this condition include:  Severe pain.  Pain that gets worse over time.  Pain that is worse when you stand, walk, sit, or bend.  Sudden pain that is so bad that it is hard for you to move.  Bending or humping of the spine.  Gradual loss of height.  Numbness, tingling, or weakness in the back and legs.  Trouble walking. Your symptoms will depend on the cause of the fracture and how quickly it develops. For example, fractures that are caused by osteoporosis can cause few symptoms, no symptoms, or symptoms that develop slowly over time. How is this diagnosed? This condition may be diagnosed based on symptoms, medical history, and a physical exam. During the physical exam, your health care provider may tap along the length of your spine to check for tenderness. Tests may be done to confirm the diagnosis. They may include:  A bone density test to check for osteoporosis.  Imaging tests, such as a spine X-ray, a CT scan, or MRI. How is this treated? Treatment for this condition depends on the cause and severity of the condition.Some fractures, such as those that are caused by osteoporosis, may heal on their own with supportive care. This may include:  Pain medicine.  Rest.  A back brace.  Physical therapy exercises.  Medicine that reduces bone pain.  Calcium and vitamin D supplements. Fractures that cause the back to become misshapen, cause nerve pain or weakness, or do not respond to other treatment may be treated with a surgical procedure, such as:  Vertebroplasty. In this procedure, bone cement is injected into the collapsed vertebrae to stabilize them.  Balloon kyphoplasty. In  this procedure, the collapsed vertebrae are expanded with a balloon and then bone cement is injected into them.  Spinal fusion. In this procedure, the collapsed vertebrae are connected (fused) to normal vertebrae. Follow these instructions at home: General instructions  Take medicines only as directed by your health care provider.  Do not drive or operate heavy machinery while taking pain medicine.  If directed, apply ice to the injured area:  Put ice in a plastic bag.  Place a towel between your skin and the bag.  Leave the ice on for 30 minutes every two hours at first. Then apply the ice as needed.  Wear your neck brace or back brace as directed by your health care provider.  Do not drink alcohol. Alcohol can interfere with your treatment.  Keep all follow-up visits as directed by your health care provider. This is important. It can help to prevent permanent injury, disability, and long-lasting (chronic) pain. Activity  Stay in bed (on bed rest) only as directed by your health care provider. Being on bed rest for  too long can make your condition worse.  Return to your normal activities as directed by your health care provider. Ask what activities are safe for you.  Do exercises to improve motion and strength in your back (physical therapy), as recommended by your health care provider.  Exercise regularly as directed by your health care provider. Contact a health care provider if:  You have a fever.  You develop a cough that makes your pain worse.  Your pain medicine is not helping.  Your pain does not get better over time.  You cannot return to your normal activities as planned or expected. Get help right away if:  Your pain is very bad and it suddenly gets worse.  You are unable to move any body part (paralysis) that is below the level of your injury.  You have numbness, tingling, or weakness in any body part that is below the level of your injury.  You cannot  control your bladder or bowels. This information is not intended to replace advice given to you by your health care provider. Make sure you discuss any questions you have with your health care provider. Document Released: 07/23/2005 Document Revised: 03/20/2016 Document Reviewed: 07/27/2014 Elsevier Interactive Patient Education  2017 [employer].

## 2016-08-29 NOTE — Progress Notes (Signed)
3 month FOLLOW UP  Assessment:    Essential hypertension - TSH -cont meds -diet and exercise -monitor at home  Type 2 diabetes mellitus with stage 3 chronic kidney disease, without long-term current use of insulin (HCC) -cont diet and exercise - Hemoglobin A1c   Other cirrhosis of liver (HCC) -monitored by GI -cont lactulose -cont xifaximin - AMMONIA   Hyperlipidemia -cont meds -diet and exercise - Lipid panel   Medication management - CBC with Differential/Platelet - BASIC METABOLIC PANEL WITH GFR - Hepatic function panel - Magnesium  COPD GOLD II with min reversiblity  -followed by pulmonology, continue O2  Hepatic encephalopathy (HCC) -cont lactulose and xifaximin - check lab   mrbid Obesity -cont diet and exercise   Thrombocytopenia (HCC) -monitor with CBC  Gout Will check uric acid, has increase gout med from 1/2 to whole for the past week  Thoracic back pain Unable to stand long due to pain, pain radiation around to front, worse with bowel movement, known compression fracture in the past, will get CT scan thoracic rule out fracture  Vertigo  ? From dehydration/enchephalpathy, normal CT head 2016, may need MRI but normal neuro at this time, check labs.   Right cervical lymphadenopathy US right neck larger, will repeat US  SBP Improved, will check labs  Over 30 minutes of exam, counseling, chart review, and critical decision making was performed   Subjective:   [name] is a 71 y.o. female who presents 3 month follow up on hypertension, diabetes, hyperlipidemia, vitamin D def.   Her blood pressure has been controlled at home, today their BP is BP: 110/66 She does not workout. She denies chest pain, shortness of breath, dizziness.  She is not on cholesterol medication and denies myalgias. Her cholesterol is not at goal. The cholesterol last visit was:   Lab Results  Component Value Date   CHOL 186 04/19/2016   HDL 30 (L) 04/19/2016   LDLCALC 85 04/19/2016   TRIG 356 (H) 04/19/2016   CHOLHDL 6.2 (H) 04/19/2016   She has not been working on diet and exercise for diabetes, and denies foot ulcerations, hyperglycemia, hypoglycemia , increased appetite, nausea, paresthesia of the feet, polydipsia, polyuria, visual disturbances, vomiting and weight loss. Last A1C in the office was:  Lab Results  Component Value Date   HGBA1C 6.4 (H) 04/19/2016   Last GFR  Lab Results  Component Value Date   GFRNONAA 51 (L) 07/19/2016   Patient is on Vitamin D supplement. Lab Results  Component Value Date   VD25OH 42 04/19/2016      She follows with Dr. Havery Moros, NASH, she has history of esophageal varices and but is on bisoprolol rather than naldalol due to lung function. She has had several admissions for encephalopathy, she has been taking her lactulose regularly per her husband.  Most recently had admission for SBP, treated with rocephin, and doing better.   Weight has been stable, she is on lasix 80 in the morning and 40 in the evening, getting 2.5 of the metolazone when her weight gets to 175 with extra potassium pill, last given 2 days ago.   She had cementing in Nov for compression fracture, she states that at that time a doctor "hit down her back" to check for "other fractures" and since that time she has had pain in her lower back that radiates around to her AB, no falls since that time, states that worse with BM, will have back pain with pain radiation around  to her AB.   She also states she has had vertigo, intermittent x several months, has to lay down/hold on to things, Worse with sitting up in the bed in the AM and occ with sitting to standing from the couch. CT head normal 09/2015.   Medication Review Current Outpatient Prescriptions on File Prior to Visit  Medication Sig Dispense Refill  . albuterol (PROVENTIL HFA;VENTOLIN HFA) 108 (90 Base) MCG/ACT inhaler Inhale 2 puffs into the lungs every 6 (six) hours as needed for  wheezing or shortness of breath. 1 Inhaler 2  . albuterol (PROVENTIL) (2.5 MG/3ML) 0.083% nebulizer solution Take 2.5 mg by nebulization every 4 (four) hours as needed for wheezing or shortness of breath.     . allopurinol (ZYLOPRIM) 300 MG tablet take 1/2-1 tablet by mouth daily for gout (Patient taking differently: TAKE 300 MG BY MOUTH DAILY) 90 tablet 1  . ALPRAZolam (XANAX) 1 MG tablet Take 1 mg by mouth at bedtime as needed for anxiety.    Marland Kitchen aspirin 81 MG tablet Take 81 mg by mouth at bedtime.     . benzonatate (TESSALON) 100 MG capsule Take 100 mg by mouth daily as needed for cough.     . bisoprolol (ZEBETA) 10 MG tablet Take 1 tablet (10 mg total) by mouth daily. 30 tablet 11  . calcitonin, salmon, (MIACALCIN/FORTICAL) 200 UNIT/ACT nasal spray Place 1 spray into alternate nostrils daily.    . citalopram (CELEXA) 40 MG tablet Take 1 tablet (40 mg total) by mouth 2 (two) times daily. 60 tablet 3  . cyclobenzaprine (FLEXERIL) 10 MG tablet Take 1 tablet (10 mg total) by mouth 3 (three) times daily as needed for muscle spasms. 90 tablet 2  . ferrous sulfate 325 (65 FE) MG tablet Take 325 mg by mouth daily with breakfast.    . furosemide (LASIX) 80 MG tablet Take 80 mg in the morning and one-half tablet (40 mg) in the evenings 60 tablet 6  . gabapentin (NEURONTIN) 300 MG capsule Take 1 capsule by mouth in the AM, 1 capsule in the afternoon and 1-3 capsules at night.120 120 capsule 1  . ipratropium (ATROVENT) 0.02 % nebulizer solution use 1 vial in nebulizer EVERY 4 HOURS AS NEEDED FOR WHEEZING OR SHORTNESS OF BREATH 75 mL 0  . lactulose (CHRONULAC) 10 GM/15ML solution Take 30 ml (1 ounce) 2 x day (Patient taking differently: Take 20 g by mouth 2 (two) times daily. ) 1892 mL 99  . Magnesium 250 MG TABS Take 250 mg by mouth at bedtime.     . ondansetron (ZOFRAN) 4 MG tablet Take 1 tablet (4 mg total) by mouth daily as needed for nausea or vomiting. 30 tablet 1  . OXYGEN Inhale 3 L/min into the lungs  continuous.     . pantoprazole (PROTONIX) 40 MG tablet Take 1 tablet (40 mg total) by mouth 2 (two) times daily. 180 tablet 1  . potassium chloride (K-DUR) 10 MEQ tablet Take 6 tablets (60 mEq total) by mouth daily. (Patient taking differently: Take 2 tablets (40 mEq total) by mouth three time daily.) 180 tablet 3  . rifaximin (XIFAXAN) 550 MG TABS tablet Take 550 mg by mouth daily as needed (IBS/diarrhea symptoms).     . traMADol (ULTRAM) 50 MG tablet take 1 TABLET BY MOUTH EVERY 8 HOURS (Patient taking differently: TAKE 50 MG BY MOUTH EVERY 8 HOURS AS NEEDED FOR PAIN) 90 tablet 1  . triamcinolone cream (KENALOG) 0.1 % Apply 1 application topically 3 (  three) times daily as needed for itching.  0   No current facility-administered medications on file prior to visit.     Current Problems (verified) Patient Active Problem List   Diagnosis Date Noted  . Lactic acidosis 07/11/2016  . Atrial tachycardia, multifocal ([place])   . QT prolongation 03/05/2016  . Aspiration pneumonia ([place]) 01/24/2016  . Chronic respiratory failure with hypoxia ([place])   . COPD with exacerbation ([place])   . Acute renal failure superimposed on stage 3 chronic kidney disease ([place])   . Hyponatremia   . Acute on chronic respiratory failure ([place]) 01/19/2016  . DM type 2 causing CKD stage 3 ([place]) 10/30/2015  . Hepatic encephalopathy ([place]) 09/21/2015  . Hypokalemia 09/21/2015  . COPD GOLD II with min reversiblity  08/30/2015  . CKD stage 3 due to type 2 diabetes mellitus ([place]) 08/16/2015  . Chronic respiratory failure with hypoxia and hypercapnia ([place]) 08/07/2015  . Thrombocytopenia ([place]) 08/07/2015  . Venous insufficiency 07/26/2015  . Encounter for Medicare annual wellness exam 02/16/2015  . At high risk for falls 01/13/2015  . Obesity 09/21/2014  . Medication management 12/14/2013  . Essential hypertension   . Hyperlipidemia   . GERD   . Vitamin D deficiency   . IBS   . Fibromyalgia   . Esophageal varices ([place])  07/08/2012  . COLONIC POLYPS 11/11/2008  . Anemia 05/28/2008  . Hepatic cirrhosis ([place]) 05/28/2008  . Esophagitis 05/27/2008  . Diverticulosis of large intestine 05/27/2008      Objective:   Today's Vitals   08/29/16 1134  BP: 110/66  Pulse: 76  Resp: 16  Temp: 98.9 F (37.2 C)  SpO2: 95%  Weight: 174 lb (78.9 kg)  Height: 5' 2"  (1.575 m)   Body mass index is 31.83 kg/m. General Appearance: Well nourished, in no apparent distress. Eyes: PERRLA, EOMs, conjunctiva no swelling or erythema Sinuses: No Frontal/maxillary tenderness ENT/Mouth: Ext aud canals clear, TMs without erythema.Tonsils not swollen or erythematous. Neck: Supple, thyroid normal.  Respiratory: 3L O2 via [place], diffuse decreased breath sounds, BS equal bilaterally without rales, rhonchi, wheezing or stridor.  Cardio: RRR with 2/6 systolic murmur. Brisk peripheral pulses with bilateral edema, with stasis dermatitis, depdendent rubor Abdomen: Soft, + BS, Distended, LUQ/epigastric tender, no guarding, rebound, hernias, masses. Lymphatics: + bilateral tender cervical lymphadenopathy.  Musculoskeletal: Full ROM, 4/5 strength, in wheelchair, Decreased ROM left shoulder due to pain. + tenderness T 10-11 Anterior left shin with healing scab, and erythema, no weeping.  Skin: Warm, dry without rashes, lesions, ecchymosis.  Neuro: Cranial nerves intact. No cerebellar symptoms.  Psych: Awake and oriented X 3, normal affect, Insight and Judgment appropriate.    Vicie Mutters, PA-C   08/29/2016

## 2016-08-30 LAB — AMMONIA: AMMONIA: 59 umol/L — AB (ref <=47)

## 2016-08-30 LAB — HEMOGLOBIN A1C
HEMOGLOBIN A1C: 5.8 % — AB (ref <5.7)
Mean Plasma Glucose: 120 mg/dL

## 2016-09-03 ENCOUNTER — Telehealth: Payer: Self-pay | Admitting: *Deleted

## 2016-09-03 NOTE — Telephone Encounter (Signed)
[name], with [facility], called and reported the patient has been having vertigo x 1-2 weeks and reports increase in pain level to 8.  Per Dr Melford Aase, the patient needs an OV to be evaluated.

## 2016-09-04 ENCOUNTER — Ambulatory Visit (INDEPENDENT_AMBULATORY_CARE_PROVIDER_SITE_OTHER): Payer: PPO | Admitting: Internal Medicine

## 2016-09-04 VITALS — BP 118/66 | HR 64 | Temp 97.7°F | Resp 16 | Ht 62.0 in | Wt 172.0 lb

## 2016-09-04 DIAGNOSIS — H8110 Benign paroxysmal vertigo, unspecified ear: Secondary | ICD-10-CM

## 2016-09-04 MED ORDER — PREDNISONE 10 MG PO TABS: ORAL_TABLET | ORAL | 0 refills | Status: DC

## 2016-09-04 MED ORDER — MECLIZINE HCL 25 MG PO TABS: ORAL_TABLET | ORAL | 1 refills | Status: DC

## 2016-09-04 NOTE — Progress Notes (Signed)
Subjective:    Patient ID: [name], female    DOB: 1946-06-24, 71 y.o.   MRN: [id number]  HPI   This verty nice 71 yo MWF with NASH/ End stage Cirrhosis, O2 dependent COPD presents today with c/o typical positional type Vertigo. Has had some associated N/V. But denies any other sx's as N/V.   Outpatient Medications Prior to Visit  Medication Sig  . albuterol (PROVENTIL HFA;VENTOLIN HFA) 108 (90 Base) MCG/ACT inhaler Inhale 2 puffs into the lungs every 6 (six) hours as needed for wheezing or shortness of breath.  Marland Kitchen albuterol (PROVENTIL) (2.5 MG/3ML) 0.083% nebulizer solution Take 2.5 mg by nebulization every 4 (four) hours as needed for wheezing or shortness of breath.   . allopurinol (ZYLOPRIM) 300 MG tablet take 1/2-1 tablet by mouth daily for gout (Patient taking differently: TAKE 300 MG BY MOUTH DAILY)  . ALPRAZolam (XANAX) 1 MG tablet Take 1 mg by mouth at bedtime as needed for anxiety.  Marland Kitchen aspirin 81 MG tablet Take 81 mg by mouth at bedtime.   . benzonatate (TESSALON) 100 MG capsule Take 100 mg by mouth daily as needed for cough.   . bisoprolol (ZEBETA) 10 MG tablet Take 1 tablet (10 mg total) by mouth daily.  . calcitonin, salmon, (MIACALCIN/FORTICAL) 200 UNIT/ACT nasal spray Place 1 spray into alternate nostrils daily.  . citalopram (CELEXA) 40 MG tablet Take 1 tablet (40 mg total) by mouth 2 (two) times daily.  . cyclobenzaprine (FLEXERIL) 10 MG tablet Take 1 tablet (10 mg total) by mouth 3 (three) times daily as needed for muscle spasms.  . ferrous sulfate 325 (65 FE) MG tablet Take 325 mg by mouth daily with breakfast.  . furosemide (LASIX) 80 MG tablet Take 80 mg in the morning and one-half tablet (40 mg) in the evenings  . gabapentin (NEURONTIN) 300 MG capsule Take 1 capsule by mouth in the AM, 1 capsule in the afternoon and 1-3 capsules at night.120  . ipratropium (ATROVENT) 0.02 % nebulizer solution use 1 vial in nebulizer EVERY 4 HOURS AS NEEDED FOR WHEEZING OR SHORTNESS OF  BREATH  . lactulose (CHRONULAC) 10 GM/15ML solution Take 30 ml (1 ounce) 2 x day (Patient taking differently: Take 20 g by mouth 2 (two) times daily. )  . Magnesium 250 MG TABS Take 250 mg by mouth at bedtime.   . ondansetron (ZOFRAN) 4 MG tablet Take 1 tablet (4 mg total) by mouth daily as needed for nausea or vomiting.  . OXYGEN Inhale 3 L/min into the lungs continuous.   . pantoprazole (PROTONIX) 40 MG tablet Take 1 tablet (40 mg total) by mouth 2 (two) times daily.  . potassium chloride (K-DUR) 10 MEQ tablet Take 6 tablets (60 mEq total) by mouth daily. (Patient taking differently: Take 2 tablets (40 mEq total) by mouth three time daily.)  . rifaximin (XIFAXAN) 550 MG TABS tablet Take 550 mg by mouth daily as needed (IBS/diarrhea symptoms).   . traMADol (ULTRAM) 50 MG tablet take 1 TABLET BY MOUTH EVERY 8 HOURS (Patient taking differently: TAKE 50 MG BY MOUTH EVERY 8 HOURS AS NEEDED FOR PAIN)  . triamcinolone cream (KENALOG) 0.1 % Apply 1 application topically 3 (three) times daily as needed for itching.   No facility-administered medications prior to visit.    Allergies  Allergen Reactions  . Atorvastatin Other (See Comments)    Doesn't remember  . Diphenhydramine Hcl (Sleep) Hives  . Hydrocodone-Acetaminophen Other (See Comments)    Doesn't remember  .  Lopid [Gemfibrozil] Other (See Comments)    Doesn't remember  . Loratadine Hives  . Lorazepam Hives  . Simvastatin Other (See Comments)  . Sulfamethoxazole Hives  . Sulfonamide Derivatives Hives   Past Medical History:  Diagnosis Date  . Asthma   . COPD (chronic obstructive pulmonary disease) ([place])   . Esophageal varices ([place])   . Fibromyalgia   . Gastritis   . Hepatic cirrhosis ([place])   . Hepatic encephalopathy ([place])   . Hyperlipidemia   . Hypertension   . IBS (irritable bowel syndrome)   . Splenomegaly   . Vitamin D deficiency    Past Surgical History:  Procedure Laterality Date  . ABDOMINAL HYSTERECTOMY    . BACK  SURGERY    . CHOLECYSTECTOMY    . ESOPHAGOGASTRODUODENOSCOPY  07/16/2012   Procedure: ESOPHAGOGASTRODUODENOSCOPY (EGD);  Surgeon: Inda Castle, MD;  Location: [facility];  Service: Endoscopy;  Laterality: N/A;  . GASTRIC VARICES BANDING  07/16/2012   Procedure: GASTRIC VARICES BANDING;  Surgeon: Inda Castle, MD;  Location: [facility];  Service: Endoscopy;  Laterality: N/A;  . HIP ARTHROPLASTY Bilateral   . IR GENERIC HISTORICAL  06/25/2016   IR RADIOLOGIST EVAL & MGMT 06/25/2016 [facility]  . IR GENERIC HISTORICAL  06/27/2016   IR KYPHO LUMBAR INC FX REDUCE BONE BX UNI/BIL CANNULATION INC/IMAGING 06/27/2016 Luanne Bras, MD [facility]  . IR GENERIC HISTORICAL  07/17/2016   IR RADIOLOGIST EVAL & MGMT 07/17/2016 [facility]  . NECK SURGERY    . SHOULDER SURGERY Right    Review of Systems  10 point systems review negative except as above.     Objective:   Physical Exam   BP 118/66   Pulse 64   Temp 97.7 F (36.5 C)   Resp 16   Ht 5' 2"  (1.575 m)   Wt 172 lb (78 kg)   BMI 31.46 kg/m   HEENT - Eac's patent. TM's Nl. EOM's full. PERRLA. NasoOroPharynx clear. Neck - supple. Nl Thyroid. Carotids 2+ & No bruits, nodes, JVD Chest - Clear equal BS w/o Rales, rhonchi, wheezes. Cor - Nl HS. RRR w/o sig MGR. PP 1(+). No edema. Abd - Tender, protuberant, but soft.  MS- FROM w/o deformities. Muscle power, tone and bulk Nl. In wheelchair.  Neuro - No obvious Cr N abnormalities. Sensory, motor and Cerebellar functions appear Nl w/o focal abnormalities. Psyche - Mental status normal & appropriate.     Assessment & Plan:   1. Benign paroxysmal positional vertigo, unspecified laterality  - meclizine (ANTIVERT) 25 MG tablet; Take 1/2 to 12 tablet 2 or 3 x/ day if needed for Vertigo  Dispense: 90 tablet; Refill: 1  - predniSONE (DELTASONE) 10 MG tablet; 1 tab 3 x day for 3 days, then 1 tab 2 x day for 3 days, then 1 tab 1 x day for 5 days  Dispense: 20 tablet

## 2016-09-05 ENCOUNTER — Encounter: Payer: Self-pay | Admitting: Internal Medicine

## 2016-09-05 NOTE — Patient Instructions (Signed)
Benign Positional Vertigo Introduction Vertigo is the feeling that you or your surroundings are moving when they are not. Benign positional vertigo is the most common form of vertigo. The cause of this condition is not serious (is benign). This condition is triggered by certain movements and positions (is positional). This condition can be dangerous if it occurs while you are doing something that could endanger you or others, such as driving. What are the causes? In many cases, the cause of this condition is not known. It may be caused by a disturbance in an area of the inner ear that helps your brain to sense movement and balance. This disturbance can be caused by a viral infection (labyrinthitis), head injury, or repetitive motion. What increases the risk? This condition is more likely to develop in:  Women.  People who are 50 years of age or older. What are the signs or symptoms? Symptoms of this condition usually happen when you move your head or your eyes in different directions. Symptoms may start suddenly, and they usually last for less than a minute. Symptoms may include:  Loss of balance and falling.  Feeling like you are spinning or moving.  Feeling like your surroundings are spinning or moving.  Nausea and vomiting.  Blurred vision.  Dizziness.  Involuntary eye movement (nystagmus). Symptoms can be mild and cause only slight annoyance, or they can be severe and interfere with daily life. Episodes of benign positional vertigo may return (recur) over time, and they may be triggered by certain movements. Symptoms may improve over time. How is this diagnosed? This condition is usually diagnosed by medical history and a physical exam of the head, neck, and ears. You may be referred to a health care provider who specializes in ear, nose, and throat (ENT) problems (otolaryngologist) or a provider who specializes in disorders of the nervous system (neurologist). You may have  additional testing, including:  MRI.  A CT scan.  Eye movement tests. Your health care provider may ask you to change positions quickly while he or she watches you for symptoms of benign positional vertigo, such as nystagmus. Eye movement may be tested with an electronystagmogram (ENG), caloric stimulation, the Dix-Hallpike test, or the roll test.  An electroencephalogram (EEG). This records electrical activity in your brain.  Hearing tests. How is this treated? Usually, your health care provider will treat this by moving your head in specific positions to adjust your inner ear back to normal. Surgery may be needed in severe cases, but this is rare. In some cases, benign positional vertigo may resolve on its own in 2-4 weeks. Follow these instructions at home: Safety  Move slowly.Avoid sudden body or head movements.  Avoid driving.  Avoid operating heavy machinery.  Avoid doing any tasks that would be dangerous to you or others if a vertigo episode would occur.  If you have trouble walking or keeping your balance, try using a cane for stability. If you feel dizzy or unstable, sit down right away.  Return to your normal activities as told by your health care provider. Ask your health care provider what activities are safe for you. General instructions  Take over-the-counter and prescription medicines only as told by your health care provider.  Avoid certain positions or movements as told by your health care provider.  Drink enough fluid to keep your urine clear or pale yellow.  Keep all follow-up visits as told by your health care provider. This is important. Contact a health care provider if:    You have a fever.  Your condition gets worse or you develop new symptoms.  Your family or friends notice any behavioral changes.  Your nausea or vomiting gets worse.  You have numbness or a "pins and needles" sensation. Get help right away if:  You have difficulty speaking or  moving.  You are always dizzy.  You faint.  You develop severe headaches.  You have weakness in your legs or arms.  You have changes in your hearing or vision.  You develop a stiff neck.  You develop sensitivity to light. This information is not intended to replace advice given to you by your health care provider. Make sure you discuss any questions you have with your health care provider. Document Released: 04/30/2006 Document Revised: 12/29/2015 Document Reviewed: 11/15/2014  2017 Elsevier  

## 2016-09-13 DIAGNOSIS — J449 Chronic obstructive pulmonary disease, unspecified: Secondary | ICD-10-CM | POA: Diagnosis not present

## 2016-09-13 DIAGNOSIS — R261 Paralytic gait: Secondary | ICD-10-CM | POA: Diagnosis not present

## 2016-09-13 DIAGNOSIS — J9611 Chronic respiratory failure with hypoxia: Secondary | ICD-10-CM | POA: Diagnosis not present

## 2016-09-13 DIAGNOSIS — M6281 Muscle weakness (generalized): Secondary | ICD-10-CM | POA: Diagnosis not present

## 2016-09-13 DIAGNOSIS — M545 Low back pain: Secondary | ICD-10-CM | POA: Diagnosis not present

## 2016-09-13 DIAGNOSIS — E1165 Type 2 diabetes mellitus with hyperglycemia: Secondary | ICD-10-CM | POA: Diagnosis not present

## 2016-09-13 DIAGNOSIS — N183 Chronic kidney disease, stage 3 (moderate): Secondary | ICD-10-CM | POA: Diagnosis not present

## 2016-09-13 DIAGNOSIS — E1122 Type 2 diabetes mellitus with diabetic chronic kidney disease: Secondary | ICD-10-CM | POA: Diagnosis not present

## 2016-09-13 DIAGNOSIS — K7581 Nonalcoholic steatohepatitis (NASH): Secondary | ICD-10-CM | POA: Diagnosis not present

## 2016-09-13 DIAGNOSIS — I129 Hypertensive chronic kidney disease with stage 1 through stage 4 chronic kidney disease, or unspecified chronic kidney disease: Secondary | ICD-10-CM | POA: Diagnosis not present

## 2016-09-17 ENCOUNTER — Ambulatory Visit
Admission: RE | Admit: 2016-09-17 | Discharge: 2016-09-17 | Disposition: A | Discharge status: Home / Self Care | Payer: PPO | Source: Ambulatory Visit | Attending: Physician Assistant | Admitting: Physician Assistant

## 2016-09-17 DIAGNOSIS — M546 Pain in thoracic spine: Secondary | ICD-10-CM

## 2016-09-17 DIAGNOSIS — R590 Localized enlarged lymph nodes: Secondary | ICD-10-CM | POA: Diagnosis not present

## 2016-09-17 DIAGNOSIS — S22080A Wedge compression fracture of T11-T12 vertebra, initial encounter for closed fracture: Secondary | ICD-10-CM | POA: Diagnosis not present

## 2016-09-17 NOTE — Progress Notes (Signed)
Pt aware of lab results & voiced understanding of those results.

## 2016-09-17 NOTE — Progress Notes (Signed)
LVM for pt to return office call for LAB results.

## 2016-09-19 ENCOUNTER — Other Ambulatory Visit: Payer: Self-pay | Admitting: *Deleted

## 2016-09-19 DIAGNOSIS — J449 Chronic obstructive pulmonary disease, unspecified: Secondary | ICD-10-CM | POA: Diagnosis not present

## 2016-09-19 MED ORDER — CITALOPRAM HYDROBROMIDE 40 MG PO TABS: 40.0000 mg | ORAL_TABLET | Freq: Two times a day (BID) | ORAL | 3 refills | Status: DC

## 2016-09-25 DIAGNOSIS — E1165 Type 2 diabetes mellitus with hyperglycemia: Secondary | ICD-10-CM | POA: Diagnosis not present

## 2016-09-25 DIAGNOSIS — J449 Chronic obstructive pulmonary disease, unspecified: Secondary | ICD-10-CM | POA: Diagnosis not present

## 2016-09-25 DIAGNOSIS — J9611 Chronic respiratory failure with hypoxia: Secondary | ICD-10-CM | POA: Diagnosis not present

## 2016-09-25 DIAGNOSIS — M545 Low back pain: Secondary | ICD-10-CM | POA: Diagnosis not present

## 2016-09-25 DIAGNOSIS — N183 Chronic kidney disease, stage 3 (moderate): Secondary | ICD-10-CM | POA: Diagnosis not present

## 2016-09-25 DIAGNOSIS — R261 Paralytic gait: Secondary | ICD-10-CM | POA: Diagnosis not present

## 2016-09-25 DIAGNOSIS — I129 Hypertensive chronic kidney disease with stage 1 through stage 4 chronic kidney disease, or unspecified chronic kidney disease: Secondary | ICD-10-CM | POA: Diagnosis not present

## 2016-09-25 DIAGNOSIS — E1122 Type 2 diabetes mellitus with diabetic chronic kidney disease: Secondary | ICD-10-CM | POA: Diagnosis not present

## 2016-09-25 DIAGNOSIS — K7581 Nonalcoholic steatohepatitis (NASH): Secondary | ICD-10-CM | POA: Diagnosis not present

## 2016-09-25 DIAGNOSIS — M6281 Muscle weakness (generalized): Secondary | ICD-10-CM | POA: Diagnosis not present

## 2016-10-01 ENCOUNTER — Other Ambulatory Visit: Payer: Self-pay | Admitting: Internal Medicine

## 2016-10-04 DIAGNOSIS — E1122 Type 2 diabetes mellitus with diabetic chronic kidney disease: Secondary | ICD-10-CM | POA: Diagnosis not present

## 2016-10-04 DIAGNOSIS — K7581 Nonalcoholic steatohepatitis (NASH): Secondary | ICD-10-CM | POA: Diagnosis not present

## 2016-10-04 DIAGNOSIS — J9611 Chronic respiratory failure with hypoxia: Secondary | ICD-10-CM | POA: Diagnosis not present

## 2016-10-04 DIAGNOSIS — N183 Chronic kidney disease, stage 3 (moderate): Secondary | ICD-10-CM | POA: Diagnosis not present

## 2016-10-04 DIAGNOSIS — M545 Low back pain: Secondary | ICD-10-CM | POA: Diagnosis not present

## 2016-10-04 DIAGNOSIS — R261 Paralytic gait: Secondary | ICD-10-CM | POA: Diagnosis not present

## 2016-10-04 DIAGNOSIS — M6281 Muscle weakness (generalized): Secondary | ICD-10-CM | POA: Diagnosis not present

## 2016-10-04 DIAGNOSIS — I129 Hypertensive chronic kidney disease with stage 1 through stage 4 chronic kidney disease, or unspecified chronic kidney disease: Secondary | ICD-10-CM | POA: Diagnosis not present

## 2016-10-04 DIAGNOSIS — J449 Chronic obstructive pulmonary disease, unspecified: Secondary | ICD-10-CM | POA: Diagnosis not present

## 2016-10-04 DIAGNOSIS — E1165 Type 2 diabetes mellitus with hyperglycemia: Secondary | ICD-10-CM | POA: Diagnosis not present

## 2016-10-09 ENCOUNTER — Other Ambulatory Visit: Payer: Self-pay | Admitting: Internal Medicine

## 2016-10-09 DIAGNOSIS — M25562 Pain in left knee: Secondary | ICD-10-CM | POA: Diagnosis not present

## 2016-10-09 DIAGNOSIS — M1712 Unilateral primary osteoarthritis, left knee: Secondary | ICD-10-CM | POA: Diagnosis not present

## 2016-10-12 ENCOUNTER — Inpatient Hospital Stay (HOSPITAL_COMMUNITY): Payer: PPO

## 2016-10-12 ENCOUNTER — Emergency Department (HOSPITAL_COMMUNITY): Payer: PPO

## 2016-10-12 ENCOUNTER — Telehealth: Payer: Self-pay | Admitting: *Deleted

## 2016-10-12 ENCOUNTER — Encounter (HOSPITAL_COMMUNITY): Payer: Self-pay | Admitting: Emergency Medicine

## 2016-10-12 ENCOUNTER — Inpatient Hospital Stay (HOSPITAL_COMMUNITY)
Admission: EM | Admit: 2016-10-12 | Discharge: 2016-10-14 | DRG: 194 | Disposition: A | Discharge status: Home / Self Care | Payer: PPO | Attending: Internal Medicine | Admitting: Internal Medicine

## 2016-10-12 DIAGNOSIS — K7581 Nonalcoholic steatohepatitis (NASH): Secondary | ICD-10-CM | POA: Diagnosis not present

## 2016-10-12 DIAGNOSIS — K7460 Unspecified cirrhosis of liver: Secondary | ICD-10-CM | POA: Diagnosis not present

## 2016-10-12 DIAGNOSIS — I5032 Chronic diastolic (congestive) heart failure: Secondary | ICD-10-CM | POA: Diagnosis not present

## 2016-10-12 DIAGNOSIS — Z882 Allergy status to sulfonamides status: Secondary | ICD-10-CM

## 2016-10-12 DIAGNOSIS — E872 Acidosis, unspecified: Secondary | ICD-10-CM | POA: Diagnosis present

## 2016-10-12 DIAGNOSIS — G8929 Other chronic pain: Secondary | ICD-10-CM | POA: Diagnosis present

## 2016-10-12 DIAGNOSIS — D696 Thrombocytopenia, unspecified: Secondary | ICD-10-CM | POA: Diagnosis not present

## 2016-10-12 DIAGNOSIS — E669 Obesity, unspecified: Secondary | ICD-10-CM | POA: Diagnosis not present

## 2016-10-12 DIAGNOSIS — Z9981 Dependence on supplemental oxygen: Secondary | ICD-10-CM

## 2016-10-12 DIAGNOSIS — A419 Sepsis, unspecified organism: Secondary | ICD-10-CM | POA: Diagnosis not present

## 2016-10-12 DIAGNOSIS — Z7982 Long term (current) use of aspirin: Secondary | ICD-10-CM

## 2016-10-12 DIAGNOSIS — E1122 Type 2 diabetes mellitus with diabetic chronic kidney disease: Secondary | ICD-10-CM | POA: Diagnosis present

## 2016-10-12 DIAGNOSIS — E876 Hypokalemia: Secondary | ICD-10-CM | POA: Diagnosis not present

## 2016-10-12 DIAGNOSIS — Z90710 Acquired absence of both cervix and uterus: Secondary | ICD-10-CM

## 2016-10-12 DIAGNOSIS — I10 Essential (primary) hypertension: Secondary | ICD-10-CM

## 2016-10-12 DIAGNOSIS — E559 Vitamin D deficiency, unspecified: Secondary | ICD-10-CM | POA: Diagnosis present

## 2016-10-12 DIAGNOSIS — R509 Fever, unspecified: Secondary | ICD-10-CM | POA: Diagnosis not present

## 2016-10-12 DIAGNOSIS — N183 Chronic kidney disease, stage 3 unspecified: Secondary | ICD-10-CM | POA: Diagnosis present

## 2016-10-12 DIAGNOSIS — Z7952 Long term (current) use of systemic steroids: Secondary | ICD-10-CM

## 2016-10-12 DIAGNOSIS — E7220 Disorder of urea cycle metabolism, unspecified: Secondary | ICD-10-CM | POA: Diagnosis present

## 2016-10-12 DIAGNOSIS — Y95 Nosocomial condition: Secondary | ICD-10-CM | POA: Diagnosis present

## 2016-10-12 DIAGNOSIS — K580 Irritable bowel syndrome with diarrhea: Secondary | ICD-10-CM | POA: Diagnosis not present

## 2016-10-12 DIAGNOSIS — Z833 Family history of diabetes mellitus: Secondary | ICD-10-CM | POA: Diagnosis not present

## 2016-10-12 DIAGNOSIS — E785 Hyperlipidemia, unspecified: Secondary | ICD-10-CM | POA: Diagnosis not present

## 2016-10-12 DIAGNOSIS — J9612 Chronic respiratory failure with hypercapnia: Secondary | ICD-10-CM

## 2016-10-12 DIAGNOSIS — J329 Chronic sinusitis, unspecified: Secondary | ICD-10-CM | POA: Diagnosis not present

## 2016-10-12 DIAGNOSIS — I130 Hypertensive heart and chronic kidney disease with heart failure and stage 1 through stage 4 chronic kidney disease, or unspecified chronic kidney disease: Secondary | ICD-10-CM | POA: Diagnosis not present

## 2016-10-12 DIAGNOSIS — D6959 Other secondary thrombocytopenia: Secondary | ICD-10-CM | POA: Diagnosis not present

## 2016-10-12 DIAGNOSIS — Z8249 Family history of ischemic heart disease and other diseases of the circulatory system: Secondary | ICD-10-CM

## 2016-10-12 DIAGNOSIS — Z87891 Personal history of nicotine dependence: Secondary | ICD-10-CM

## 2016-10-12 DIAGNOSIS — J3489 Other specified disorders of nose and nasal sinuses: Secondary | ICD-10-CM | POA: Diagnosis not present

## 2016-10-12 DIAGNOSIS — Z885 Allergy status to narcotic agent status: Secondary | ICD-10-CM

## 2016-10-12 DIAGNOSIS — J189 Pneumonia, unspecified organism: Secondary | ICD-10-CM | POA: Diagnosis not present

## 2016-10-12 DIAGNOSIS — M797 Fibromyalgia: Secondary | ICD-10-CM | POA: Diagnosis present

## 2016-10-12 DIAGNOSIS — J9621 Acute and chronic respiratory failure with hypoxia: Secondary | ICD-10-CM | POA: Diagnosis present

## 2016-10-12 DIAGNOSIS — J9611 Chronic respiratory failure with hypoxia: Secondary | ICD-10-CM

## 2016-10-12 DIAGNOSIS — H8110 Benign paroxysmal vertigo, unspecified ear: Secondary | ICD-10-CM

## 2016-10-12 DIAGNOSIS — Z888 Allergy status to other drugs, medicaments and biological substances status: Secondary | ICD-10-CM

## 2016-10-12 DIAGNOSIS — J440 Chronic obstructive pulmonary disease with acute lower respiratory infection: Secondary | ICD-10-CM | POA: Diagnosis not present

## 2016-10-12 DIAGNOSIS — Z79899 Other long term (current) drug therapy: Secondary | ICD-10-CM

## 2016-10-12 DIAGNOSIS — Z6830 Body mass index (BMI) 30.0-30.9, adult: Secondary | ICD-10-CM

## 2016-10-12 DIAGNOSIS — E66811 Obesity, class 1: Secondary | ICD-10-CM | POA: Diagnosis present

## 2016-10-12 LAB — COMPREHENSIVE METABOLIC PANEL
ALK PHOS: 133 U/L — AB (ref 38–126)
ALT: 19 U/L (ref 14–54)
AST: 40 U/L (ref 15–41)
Albumin: 2.9 g/dL — ABNORMAL LOW (ref 3.5–5.0)
Anion gap: 10 (ref 5–15)
BILIRUBIN TOTAL: 1.1 mg/dL (ref 0.3–1.2)
BUN: 13 mg/dL (ref 6–20)
CALCIUM: 9 mg/dL (ref 8.9–10.3)
CO2: 35 mmol/L — ABNORMAL HIGH (ref 22–32)
CREATININE: 1.24 mg/dL — AB (ref 0.44–1.00)
Chloride: 90 mmol/L — ABNORMAL LOW (ref 101–111)
GFR calc Af Amer: 50 mL/min — ABNORMAL LOW (ref >60)
GFR, EST NON AFRICAN AMERICAN: 43 mL/min — AB (ref >60)
Glucose, Bld: 194 mg/dL — ABNORMAL HIGH (ref 65–99)
POTASSIUM: 2.9 mmol/L — AB (ref 3.5–5.1)
Sodium: 135 mmol/L (ref 135–145)
TOTAL PROTEIN: 5.4 g/dL — AB (ref 6.5–8.1)

## 2016-10-12 LAB — CBC WITH DIFFERENTIAL/PLATELET
BASOS PCT: 0 %
Basophils Absolute: 0 10*3/uL (ref 0.0–0.1)
EOS ABS: 0.2 10*3/uL (ref 0.0–0.7)
EOS PCT: 3 %
HCT: 32.7 % — ABNORMAL LOW (ref 36.0–46.0)
Hemoglobin: 10.4 g/dL — ABNORMAL LOW (ref 12.0–15.0)
Lymphocytes Relative: 16 %
Lymphs Abs: 0.9 10*3/uL (ref 0.7–4.0)
MCH: 32 pg (ref 26.0–34.0)
MCHC: 31.8 g/dL (ref 30.0–36.0)
MCV: 100.6 fL — ABNORMAL HIGH (ref 78.0–100.0)
MONO ABS: 0.4 10*3/uL (ref 0.1–1.0)
MONOS PCT: 6 %
NEUTROS PCT: 76 %
Neutro Abs: 4.6 10*3/uL (ref 1.7–7.7)
Platelets: 76 10*3/uL — ABNORMAL LOW (ref 150–400)
RBC: 3.25 MIL/uL — ABNORMAL LOW (ref 3.87–5.11)
RDW: 15 % (ref 11.5–15.5)
WBC: 6 10*3/uL (ref 4.0–10.5)

## 2016-10-12 LAB — GLUCOSE, CAPILLARY: GLUCOSE-CAPILLARY: 204 mg/dL — AB (ref 65–99)

## 2016-10-12 LAB — MAGNESIUM: Magnesium: 1.9 mg/dL (ref 1.7–2.4)

## 2016-10-12 LAB — URINALYSIS, ROUTINE W REFLEX MICROSCOPIC
Bilirubin Urine: NEGATIVE
GLUCOSE, UA: NEGATIVE mg/dL
HGB URINE DIPSTICK: NEGATIVE
KETONES UR: NEGATIVE mg/dL
Leukocytes, UA: NEGATIVE
Nitrite: NEGATIVE
PROTEIN: NEGATIVE mg/dL
Specific Gravity, Urine: 1.005 (ref 1.005–1.030)
pH: 7 (ref 5.0–8.0)

## 2016-10-12 LAB — I-STAT TROPONIN, ED: Troponin i, poc: 0 ng/mL (ref 0.00–0.08)

## 2016-10-12 LAB — I-STAT CG4 LACTIC ACID, ED: LACTIC ACID, VENOUS: 2.86 mmol/L — AB (ref 0.5–1.9)

## 2016-10-12 LAB — PROTIME-INR
INR: 1.12
Prothrombin Time: 14.5 seconds (ref 11.4–15.2)

## 2016-10-12 LAB — AMMONIA: AMMONIA: 78 umol/L — AB (ref 9–35)

## 2016-10-12 LAB — TROPONIN I: Troponin I: <0.03 ng/mL (ref <0.03)

## 2016-10-12 MED ORDER — ALPRAZOLAM 0.5 MG PO TABS: 0.5000 mg | ORAL_TABLET | Freq: Every day | ORAL | Status: DC | PRN

## 2016-10-12 MED ORDER — RIFAXIMIN 550 MG PO TABS
550.0000 mg | ORAL_TABLET | Freq: Every day | ORAL | Status: DC | PRN
Start: 2016-10-12 — End: 2016-10-12

## 2016-10-12 MED ORDER — VANCOMYCIN HCL IN DEXTROSE 750-5 MG/150ML-% IV SOLN
750.0000 mg | Freq: Two times a day (BID) | INTRAVENOUS | Status: DC
  Administered 2016-10-13 – 2016-10-14 (×3): 750 mg via INTRAVENOUS
  Filled 2016-10-12 (×3): qty 150

## 2016-10-12 MED ORDER — IPRATROPIUM BROMIDE 0.02 % IN SOLN: 0.5000 mg | RESPIRATORY_TRACT | Status: DC | PRN

## 2016-10-12 MED ORDER — ORAL CARE MOUTH RINSE
15.0000 mL | Freq: Two times a day (BID) | OROMUCOSAL | Status: DC
  Administered 2016-10-13: 15 mL via OROMUCOSAL

## 2016-10-12 MED ORDER — LACTULOSE 10 GM/15ML PO SOLN: 30.0000 g | Freq: Every day | ORAL | Status: DC | PRN

## 2016-10-12 MED ORDER — INSULIN ASPART 100 UNIT/ML ~~LOC~~ SOLN
0.0000 [IU] | Freq: Three times a day (TID) | SUBCUTANEOUS | Status: DC
  Administered 2016-10-13: 3 [IU] via SUBCUTANEOUS
  Administered 2016-10-14: 1 [IU] via SUBCUTANEOUS

## 2016-10-12 MED ORDER — CALCITONIN (SALMON) 200 UNIT/ACT NA SOLN: 1.0000 | Freq: Every day | NASAL | Status: DC

## 2016-10-12 MED ORDER — ALLOPURINOL 300 MG PO TABS
300.0000 mg | ORAL_TABLET | Freq: Every day | ORAL | Status: DC
  Administered 2016-10-13 – 2016-10-14 (×2): 300 mg via ORAL
  Filled 2016-10-12 (×2): qty 1

## 2016-10-12 MED ORDER — TRAMADOL HCL 50 MG PO TABS: 50.0000 mg | ORAL_TABLET | Freq: Three times a day (TID) | ORAL | Status: DC | PRN

## 2016-10-12 MED ORDER — INSULIN ASPART 100 UNIT/ML ~~LOC~~ SOLN
0.0000 [IU] | Freq: Every day | SUBCUTANEOUS | Status: DC
  Administered 2016-10-12: 2 [IU] via SUBCUTANEOUS
  Administered 2016-10-13: 3 [IU] via SUBCUTANEOUS

## 2016-10-12 MED ORDER — FERROUS SULFATE 325 (65 FE) MG PO TABS
325.0000 mg | ORAL_TABLET | Freq: Every day | ORAL | Status: DC
  Administered 2016-10-13: 325 mg via ORAL
  Filled 2016-10-12: qty 1

## 2016-10-12 MED ORDER — SODIUM CHLORIDE 0.9% FLUSH: 3.0000 mL | INTRAVENOUS | Status: DC | PRN

## 2016-10-12 MED ORDER — PIPERACILLIN-TAZOBACTAM 4.5 G IVPB
4.5000 g | Freq: Once | INTRAVENOUS | Status: AC
  Administered 2016-10-12: 4.5 g via INTRAVENOUS
  Filled 2016-10-12: qty 100

## 2016-10-12 MED ORDER — ASPIRIN EC 81 MG PO TBEC
81.0000 mg | DELAYED_RELEASE_TABLET | Freq: Every day | ORAL | Status: DC
  Administered 2016-10-12 – 2016-10-14 (×2): 81 mg via ORAL
  Filled 2016-10-12 (×2): qty 1

## 2016-10-12 MED ORDER — POTASSIUM CHLORIDE CRYS ER 20 MEQ PO TBCR
40.0000 meq | EXTENDED_RELEASE_TABLET | Freq: Two times a day (BID) | ORAL | Status: DC
  Administered 2016-10-12 – 2016-10-14 (×4): 40 meq via ORAL
  Filled 2016-10-12 (×4): qty 2

## 2016-10-12 MED ORDER — BISOPROLOL FUMARATE 5 MG PO TABS
10.0000 mg | ORAL_TABLET | Freq: Every day | ORAL | Status: DC
  Administered 2016-10-13 – 2016-10-14 (×2): 10 mg via ORAL
  Filled 2016-10-12 (×2): qty 2

## 2016-10-12 MED ORDER — SODIUM CHLORIDE 0.9% FLUSH: 3.0000 mL | Freq: Two times a day (BID) | INTRAVENOUS | Status: DC

## 2016-10-12 MED ORDER — POTASSIUM CHLORIDE CRYS ER 20 MEQ PO TBCR
40.0000 meq | EXTENDED_RELEASE_TABLET | Freq: Once | ORAL | Status: AC
  Administered 2016-10-12: 40 meq via ORAL
  Filled 2016-10-12: qty 2

## 2016-10-12 MED ORDER — SODIUM CHLORIDE 0.9 % IV SOLN: 250.0000 mL | INTRAVENOUS | Status: DC | PRN

## 2016-10-12 MED ORDER — ALPRAZOLAM 0.5 MG PO TABS: 1.0000 mg | ORAL_TABLET | Freq: Every evening | ORAL | Status: DC | PRN

## 2016-10-12 MED ORDER — GABAPENTIN 300 MG PO CAPS
300.0000 mg | ORAL_CAPSULE | Freq: Three times a day (TID) | ORAL | Status: DC
  Administered 2016-10-12 – 2016-10-14 (×5): 300 mg via ORAL
  Filled 2016-10-12 (×5): qty 1

## 2016-10-12 MED ORDER — ALBUTEROL SULFATE (2.5 MG/3ML) 0.083% IN NEBU: 2.5000 mg | INHALATION_SOLUTION | RESPIRATORY_TRACT | Status: DC | PRN

## 2016-10-12 MED ORDER — VANCOMYCIN HCL 10 G IV SOLR
1500.0000 mg | Freq: Once | INTRAVENOUS | Status: AC
  Administered 2016-10-12: 1500 mg via INTRAVENOUS
  Filled 2016-10-12: qty 1500

## 2016-10-12 MED ORDER — PIPERACILLIN-TAZOBACTAM 3.375 G IVPB
3.3750 g | Freq: Three times a day (TID) | INTRAVENOUS | Status: DC
  Administered 2016-10-13 – 2016-10-14 (×5): 3.375 g via INTRAVENOUS
  Filled 2016-10-12 (×6): qty 50

## 2016-10-12 NOTE — H&P (Signed)
History and Physical:    [name]   WUJ:[id number] DOB: June 26, 1946 DOA: 10/12/2016  Referring MD/provider: Dr. Tamera Punt PCP: Alesia Richards, MD   Patient coming from: Home  Chief Complaint: Fever  History of Present Illness:   [name] is an 71 y.o. female with a PMH of COPD and chronic respiratory failure on 3 L of oxygen, hypertension, hyperlipidemia, NASH/cirrhosis of the liver on diuretic therapy, stage II chronic kidney disease, chronic diastolic CHF (grade 1 diastolic dysfunction, EF 95-62 percent on recent echo) and prediabetes on metformin who presents today at the direction of her PCP for evaluation of fever.  The patient's physical therapist noted that she was febrile today, and called her PCP. The patient reports that she recently had a root canal done, and had a problem with lower jaw irritation from poorly fitting dentures and report that she had "blisters" on her lower gum line. She has not had a cough, and she is chronically short of breath.  She has chronic abdominal pain, but this is unchanged.    ED Course:  The patient's Lab work was significant for a potassium of 2.9, creatinine of 1.24, a WBC of 6.0, platelet count 76, ammonia 78, lactic acid 2.86, urinalysis negative for nitrites and leukocytes. Chest x-ray showed mild right lower lobe airspace disease. Vital signs were stable and the patient was afebrile.  ROS:   Review of Systems  Constitutional: Positive for fever and malaise/fatigue.  HENT:       Lower jaw pain.  Eyes: Negative.   Respiratory: Positive for shortness of breath. Negative for cough, hemoptysis, sputum production and wheezing.   Cardiovascular:       Chest pressure  Gastrointestinal: Positive for abdominal pain, diarrhea and nausea. Negative for blood in stool, constipation and melena.  Genitourinary: Negative.   Musculoskeletal: Negative.   Skin: Negative.   Neurological: Negative.   Endo/Heme/Allergies: Negative.     Psychiatric/Behavioral: Negative.     Past Medical History:   Past Medical History:  Diagnosis Date  . Asthma   . Esophageal varices ([place])   . Fibromyalgia   . Gastritis   . Hepatic cirrhosis ([place])   . Hepatic encephalopathy ([place])   . Hyperlipidemia   . Hypertension   . IBS (irritable bowel syndrome)   . Splenomegaly   . Vitamin D deficiency     Past Surgical History:   Past Surgical History:  Procedure Laterality Date  . ABDOMINAL HYSTERECTOMY    . BACK SURGERY    . CHOLECYSTECTOMY    . ESOPHAGOGASTRODUODENOSCOPY  07/16/2012   Procedure: ESOPHAGOGASTRODUODENOSCOPY (EGD);  Surgeon: Inda Castle, MD;  Location: [facility];  Service: Endoscopy;  Laterality: N/A;  . GASTRIC VARICES BANDING  07/16/2012   Procedure: GASTRIC VARICES BANDING;  Surgeon: Inda Castle, MD;  Location: [facility];  Service: Endoscopy;  Laterality: N/A;  . HIP ARTHROPLASTY Bilateral   . IR GENERIC HISTORICAL  06/25/2016   IR RADIOLOGIST EVAL & MGMT 06/25/2016 [facility]  . IR GENERIC HISTORICAL  06/27/2016   IR KYPHO LUMBAR INC FX REDUCE BONE BX UNI/BIL CANNULATION INC/IMAGING 06/27/2016 Luanne Bras, MD [facility]  . IR GENERIC HISTORICAL  07/17/2016   IR RADIOLOGIST EVAL & MGMT 07/17/2016 [facility]  . NECK SURGERY    . SHOULDER SURGERY Right     Social History:   Social History   Social History  . Marital status: Married    Spouse name: [name]  . Number of children: 5  .  Years of education: N/A   Occupational History  . Housewife Unemployed   Social History Main Topics  . Smoking status: Former Smoker    Packs/day: 1.00    Years: 50.00    Types: Cigarettes    Quit date: 11/05/2014  . Smokeless tobacco: Never Used     Comment: Quit April 2016  . Alcohol use No  . Drug use: No  . Sexual activity: Not on file   Other Topics Concern  . Not on file   Social History Narrative  . No narrative on file    Allergies   Atorvastatin; Diphenhydramine hcl  (sleep); Hydrocodone-acetaminophen; Lopid [gemfibrozil]; Loratadine; Lorazepam; Simvastatin; Sulfamethoxazole; and Sulfonamide derivatives  Family history:   Family History  Problem Relation Age of Onset  . Diabetes Brother     deceased  . Heart disease Sister     A Fib  . Heart disease Mother     CHF  . Atrial fibrillation Sister   . Colon cancer Neg Hx     Current Medications:   Prior to Admission medications   Medication Sig Start Date End Date Taking? Authorizing Provider  albuterol (PROVENTIL HFA;VENTOLIN HFA) 108 (90 Base) MCG/ACT inhaler Inhale 2 puffs into the lungs every 6 (six) hours as needed for wheezing or shortness of breath. 05/27/16   Kristen N Ward, DO  albuterol (PROVENTIL) (2.5 MG/3ML) 0.083% nebulizer solution Take 2.5 mg by nebulization every 4 (four) hours as needed for wheezing or shortness of breath.  04/11/16   Historical Provider, MD  allopurinol (ZYLOPRIM) 300 MG tablet take 1/2-1 tablet by mouth daily for gout Patient taking differently: TAKE 300 MG BY MOUTH DAILY 04/10/16   Unk Pinto, MD  ALPRAZolam Duanne Moron) 1 MG tablet Take 1 mg by mouth at bedtime as needed for anxiety.    Historical Provider, MD  aspirin 81 MG tablet Take 81 mg by mouth at bedtime.     Historical Provider, MD  benzonatate (TESSALON) 100 MG capsule Take 100 mg by mouth daily as needed for cough.     Historical Provider, MD  bisoprolol (ZEBETA) 10 MG tablet Take 1 tablet (10 mg total) by mouth daily. 09/13/15   Unk Pinto, MD  calcitonin, salmon, (MIACALCIN/FORTICAL) 200 UNIT/ACT nasal spray Place 1 spray into alternate nostrils daily. 06/13/16   Historical Provider, MD  citalopram (CELEXA) 40 MG tablet Take 1 tablet (40 mg total) by mouth 2 (two) times daily. 09/19/16   Unk Pinto, MD  cyclobenzaprine (FLEXERIL) 10 MG tablet Take 1 tablet (10 mg total) by mouth 3 (three) times daily as needed for muscle spasms. 03/06/16   Unk Pinto, MD  ferrous sulfate 325 (65 FE) MG tablet  Take 325 mg by mouth daily with breakfast.    Historical Provider, MD  furosemide (LASIX) 80 MG tablet Take 80 mg in the morning and one-half tablet (40 mg) in the evenings 03/06/16   Vicie Mutters, PA-C  gabapentin (NEURONTIN) 300 MG capsule Take 1 capsule by mouth in the AM, 1 capsule in the afternoon and 1-3 capsules at night.120 10/01/16   Unk Pinto, MD  ipratropium (ATROVENT) 0.02 % nebulizer solution use 1 vial in nebulizer EVERY 4 HOURS AS NEEDED FOR WHEEZING OR SHORTNESS OF BREATH 08/20/16   Unk Pinto, MD  Magnesium 250 MG TABS Take 250 mg by mouth at bedtime.     Historical Provider, MD  meclizine (ANTIVERT) 25 MG tablet Take 1/2 to 12 tablet 2 or 3 x/ day if needed for Vertigo 09/04/16  Unk Pinto, MD  metolazone (ZAROXOLYN) 5 MG tablet take daily with lasix or use as directed for fluid overload 10/09/16   Vicie Mutters, PA-C  OXYGEN Inhale 3 L/min into the lungs continuous.     Historical Provider, MD  pantoprazole (PROTONIX) 40 MG tablet Take 1 tablet (40 mg total) by mouth 2 (two) times daily. 06/25/16   Unk Pinto, MD  potassium chloride (K-DUR) 10 MEQ tablet Take 6 tablets (60 mEq total) by mouth daily. Patient taking differently: Take 2 tablets (40 mEq total) by mouth three time daily. 06/26/16   Unk Pinto, MD  predniSONE (DELTASONE) 10 MG tablet 1 tab 3 x day for 3 days, then 1 tab 2 x day for 3 days, then 1 tab 1 x day for 5 days 09/04/16   Unk Pinto, MD  rifaximin (XIFAXAN) 550 MG TABS tablet Take 550 mg by mouth daily as needed (IBS/diarrhea symptoms).     Historical Provider, MD  traMADol (ULTRAM) 50 MG tablet take 1 TABLET BY MOUTH EVERY 8 HOURS Patient taking differently: TAKE 50 MG BY MOUTH EVERY 8 HOURS AS NEEDED FOR PAIN 12/28/15   Loma Sousa Forcucci, PA-C  triamcinolone cream (KENALOG) 0.1 % Apply 1 application topically 3 (three) times daily as needed for itching. 03/22/16   Historical Provider, MD    Physical Exam:   Vitals:   10/12/16 1416  10/12/16 1538  BP: (!) 126/49   Pulse: (!) 58   Resp: 18   Temp: 98.2 F (36.8 C) 98 F (36.7 C)  TempSrc: Oral Oral  SpO2: 98%   Weight: 78 kg (172 lb)   Height: 5' 2.5" (1.588 m)      Physical Exam: Blood pressure (!) 126/49, pulse (!) 58, temperature 98 F (36.7 C), temperature source Oral, resp. rate 18, height 5' 2.5" (1.588 m), weight 78 kg (172 lb), SpO2 98 %. Gen: Obese female in no acute distress. Head: Normocephalic, atraumatic. Eyes: Pupils equal, round and reactive to light with lens implants noted. Extraocular movements intact.  Sclerae nonicteric. No lid lag. Mouth: Oropharynx reveals mildly dry mucous membranes. Dentition is poor with missing teeth to the lower jaw and a denture on the upper jaw. Neck: Supple, no thyromegaly, no lymphadenopathy, no jugular venous distention. Chest: Diminished in the bases with crackles on the right. No rhonchi or wheezes.  CV: Heart sounds are regular with an S1, S2. No murmurs, rubs, clicks, or gallops.  Abdomen: Soft, diffusely tender, mildly distended with normal active bowel sounds. No hepatosplenomegaly or palpable masses. Extremities: Extremities are without clubbing, or cyanosis. Trace edema. Pedal pulses 2+.  Skin: Warm and dry. No rashes, lesions or wounds. Neuro: Alert and oriented times 3; grossly nonfocal.  Psych: Insight is good and judgment is appropriate. Mood and affect normal.   Data Review:    Labs: Basic Metabolic Panel:  Recent Labs Lab 10/12/16 1425  NA 135  K 2.9*  CL 90*  CO2 35*  GLUCOSE 194*  BUN 13  CREATININE 1.24*  CALCIUM 9.0   Liver Function Tests:  Recent Labs Lab 10/12/16 1425  AST 40  ALT 19  ALKPHOS 133*  BILITOT 1.1  PROT 5.4*  ALBUMIN 2.9*   No results for input(s): LIPASE, AMYLASE in the last 168 hours.  Recent Labs Lab 10/12/16 1437  AMMONIA 78*   CBC:  Recent Labs Lab 10/12/16 1425  WBC 6.0  NEUTROABS 4.6  HGB 10.4*  HCT 32.7*  MCV 100.6*  PLT 76*    Cardiac Enzymes:  No results for input(s): CKTOTAL, CKMB, CKMBINDEX, TROPONINI in the last 168 hours.  BNP (last 3 results) No results for input(s): PROBNP in the last 8760 hours. CBG: No results for input(s): GLUCAP in the last 168 hours.  Urinalysis    Component Value Date/Time   COLORURINE YELLOW 10/12/2016 1553   APPEARANCEUR HAZY (A) 10/12/2016 1553   LABSPEC 1.005 10/12/2016 1553   PHURINE 7.0 10/12/2016 1553   GLUCOSEU NEGATIVE 10/12/2016 1553   HGBUR NEGATIVE 10/12/2016 1553   BILIRUBINUR NEGATIVE 10/12/2016 1553   KETONESUR NEGATIVE 10/12/2016 1553   PROTEINUR NEGATIVE 10/12/2016 1553   UROBILINOGEN 1 01/13/2015 1145   NITRITE NEGATIVE 10/12/2016 1553   LEUKOCYTESUR NEGATIVE 10/12/2016 1553      Radiographic Studies: Dg Chest 2 View  Result Date: 10/12/2016 CLINICAL DATA:  Fever for 1 week.  Nausea. EXAM: CHEST  2 VIEW COMPARISON:  01/19/2016 FINDINGS: There is mild right lower lobe airspace disease which may reflect atelectasis versus pneumonia. There is no pleural effusion or pneumothorax. The heart and mediastinal contours are unremarkable. The osseous structures are unremarkable. IMPRESSION: Mild right lower lobe airspace disease which may reflect atelectasis versus pneumonia. Followup PA and lateral chest X-ray is recommended in 3-4 weeks following trial of antibiotic therapy to ensure resolution and exclude underlying malignancy. Electronically Signed   By: Kathreen Devoid   On: 10/12/2016 14:59    EKG: Independently reviewed. Sinus rhythm at 63 bpm with a right bundle branch block. QTC is 524 ms.   Assessment/Plan:   Principal Problem:   Rule out Sepsis (HCC)/lactic acidosis/rule out HCAP, SBP and dental infection Patient noted to be febrile in her home earlier today and on presentation has lactic acidosis. Has multiple potential etiologies for infection including HCAP, SBP or an oral infection. Blood cultures have been sent. Currently hemodynamically stable  and afebrile. Abdominal ultrasound ordered to rule out ascites, and if present, will need a paracentesis to rule out SBP. Although she has chronic shortness of breath, there has not been any change in her respiratory status from baseline and she does not have any new cough or sputum production making HCAP less likely, although chest x-ray does show mild right lower lobe airspace disease and she does report some chest pressure. Given her recent root canal, will obtain a limited maxillofacial CT for evaluation of possible dental infection/sinusitis. We'll continue vancomycin and Zosyn for now. Narrow antibiotics based on culture data when able.   Active Problems:   Hepatic cirrhosis (HCC)/hyperammonemia The patient is on chronic Lasix, rifaximin, Zaroxolyn, lactulose. Hold Lasix and Zaroxolyn given severe hypokalemia. Elevated ammonia level noted, but patient currently alert. Monitor closely for developing encephalopathy.    Essential hypertension Hold diuretics for now given severe hypokalemia.    Chronic respiratory failure with hypoxia and hypercapnia (HCC) Continue supplemental oxygen.    Thrombocytopenia ([place]) Secondary to cirrhosis. Monitor for signs of bleeding. Continue rifaximin and lactulose.    Hypokalemia  Likely from chronic diuretic and lactulose use. Check magnesium. Replete.    DM type 2 causing CKD stage 3 (HCC) Baseline creatinine appears to be around 1.1. Current creatinine 1.24. SSI, insulin sensitive scale ordered.    Obesity (BMI 30-39.9) Body mass index is 30.96 kg/m.    Attestation regarding necessity of inpatient status:   The appropriate admission status for this patient is INPATIENT. Inpatient status is judged to be reasonable and necessary in order to provide the required intensity of service to ensure the patient's safety. The patient's presenting symptoms, physical  exam findings, and initial radiographic and laboratory data in the context of their chronic  comorbidities is felt to place them at high risk for further clinical deterioration. Furthermore, it is not anticipated that the patient will be medically stable for discharge from the hospital within 2 midnights of admission. The following factors support the admission status of inpatient.   -The patient's presenting symptoms include fever, weakness, dyspnea, abdominal pain, jaw pain. -The worrisome physical exam findings include distended abdomen, poor dentition, crackles right base. -The initial radiographic and laboratory data are worrisome because of RLL infiltrate, elevated ammonia, elevated lactate, severe hypokalemia. -The chronic co-morbidities include cirrhosis, chronic respiratory failure, CKD, obesity, thrombocytopenia.  * I certify that at the point of admission it is my clinical judgment that the patient will require inpatient hospital care spanning beyond 2 midnights from the point of admission due to high intensity of service, high risk for further deterioration and high frequency of surveillance required.*    Other information:   DVT prophylaxis: SCDs ordered. Code Status: Full code. Family Communication: Husband at the bedside. Disposition Plan: Home in 48-72 hours. Consults called: None. Admission status: Inpatient.   The medical decision making on this patient was of high complexity and the patient is at high risk for clinical deterioration, therefore this is a level 3 visit.   Jennifier Smitherman [facility] Pager [phone] Cell: [phone]   If 7PM-7AM, please contact night-coverage [email or web address] Password [email or web address] 10/12/2016, 5:51 PM

## 2016-10-12 NOTE — ED Triage Notes (Signed)
Pt here for fever x 1 week; pt denies any other sx but is on home O2

## 2016-10-12 NOTE — ED Provider Notes (Signed)
[facility] DEPT Provider Note   CSN: [id number] Arrival date & time: 10/12/16  1409     History   Chief Complaint Chief Complaint  Patient presents with  . Fever    HPI [name] is a 71 y.o. female.  71 year old woman with past medical history GERD, gout, COPD GOLD II, NASH, and spontaneous bacterial peritonitis presents with 1 week of fever. Her physical therapist has had a temperature of 99 - 100 with Tmax 101 this week. She continued to have fever today and was advised to see her primary care provider but there office was closed so her husband brought her to the ED. she has had sinus pressure and nasal congestion but denies cough, wheeze, myalgia. She denies dysuria and says that her urinary frequency is stable She does report diffuse abdominal pain and distension which she has at baseline relates to NASH, she is on lactulose and diarrhea is stable. 3 weeks ago she developed chest pressure over the center of her chest which is nonradiating. The pressure is intermittent and worse with eating. It worsens slightly with exertion and breathing. Pressures associated with dyspnea on exertion. Baseline she does not have dyspnea on exertion. She is not currently having chest pain.       Past Medical History:  Diagnosis Date  . Asthma   . Esophageal varices ([place])   . Fibromyalgia   . Gastritis   . Hepatic cirrhosis ([place])   . Hepatic encephalopathy ([place])   . Hyperlipidemia   . Hypertension   . IBS (irritable bowel syndrome)   . Splenomegaly   . Vitamin D deficiency     Patient Active Problem List   Diagnosis Date Noted  . Lactic acidosis 07/11/2016  . Atrial tachycardia, multifocal ([place])   . QT prolongation 03/05/2016  . Aspiration pneumonia ([place]) 01/24/2016  . Acute renal failure superimposed on stage 3 chronic kidney disease ([place])   . Hyponatremia   . Acute on chronic respiratory failure ([place]) 01/19/2016  . DM type 2 causing CKD stage 3 ([place]) 10/30/2015  .  Hepatic encephalopathy ([place]) 09/21/2015  . Hypokalemia 09/21/2015  . COPD GOLD II with min reversiblity  08/30/2015  . CKD stage 3 due to type 2 diabetes mellitus ([place]) 08/16/2015  . Chronic respiratory failure with hypoxia and hypercapnia ([place]) 08/07/2015  . Thrombocytopenia ([place]) 08/07/2015  . Venous insufficiency 07/26/2015  . Encounter for Medicare annual wellness exam 02/16/2015  . Obesity 09/21/2014  . Medication management 12/14/2013  . Essential hypertension   . Hyperlipidemia   . GERD   . Vitamin D deficiency   . IBS   . Fibromyalgia   . Esophageal varices ([place]) 07/08/2012  . COLONIC POLYPS 11/11/2008  . Anemia 05/28/2008  . Hepatic cirrhosis ([place]) 05/28/2008  . Esophagitis 05/27/2008  . Diverticulosis of large intestine 05/27/2008    Past Surgical History:  Procedure Laterality Date  . ABDOMINAL HYSTERECTOMY    . BACK SURGERY    . CHOLECYSTECTOMY    . ESOPHAGOGASTRODUODENOSCOPY  07/16/2012   Procedure: ESOPHAGOGASTRODUODENOSCOPY (EGD);  Surgeon: Inda Castle, MD;  Location: [facility];  Service: Endoscopy;  Laterality: N/A;  . GASTRIC VARICES BANDING  07/16/2012   Procedure: GASTRIC VARICES BANDING;  Surgeon: Inda Castle, MD;  Location: [facility];  Service: Endoscopy;  Laterality: N/A;  . HIP ARTHROPLASTY Bilateral   . IR GENERIC HISTORICAL  06/25/2016   IR RADIOLOGIST EVAL & MGMT 06/25/2016 [facility]  . IR GENERIC HISTORICAL  06/27/2016   IR KYPHO  LUMBAR INC FX REDUCE BONE BX UNI/BIL CANNULATION INC/IMAGING 06/27/2016 Luanne Bras, MD [facility]  . IR GENERIC HISTORICAL  07/17/2016   IR RADIOLOGIST EVAL & MGMT 07/17/2016 [facility]  . NECK SURGERY    . SHOULDER SURGERY Right     OB History    No data available       Home Medications    Prior to Admission medications   Medication Sig Start Date End Date Taking? Authorizing Provider  albuterol (PROVENTIL HFA;VENTOLIN HFA) 108 (90 Base) MCG/ACT inhaler Inhale 2 puffs into the  lungs every 6 (six) hours as needed for wheezing or shortness of breath. 05/27/16   Kristen N Ward, DO  albuterol (PROVENTIL) (2.5 MG/3ML) 0.083% nebulizer solution Take 2.5 mg by nebulization every 4 (four) hours as needed for wheezing or shortness of breath.  04/11/16   Historical Provider, MD  allopurinol (ZYLOPRIM) 300 MG tablet take 1/2-1 tablet by mouth daily for gout Patient taking differently: TAKE 300 MG BY MOUTH DAILY 04/10/16   Unk Pinto, MD  ALPRAZolam Duanne Moron) 1 MG tablet Take 1 mg by mouth at bedtime as needed for anxiety.    Historical Provider, MD  aspirin 81 MG tablet Take 81 mg by mouth at bedtime.     Historical Provider, MD  benzonatate (TESSALON) 100 MG capsule Take 100 mg by mouth daily as needed for cough.     Historical Provider, MD  bisoprolol (ZEBETA) 10 MG tablet Take 1 tablet (10 mg total) by mouth daily. 09/13/15   Unk Pinto, MD  calcitonin, salmon, (MIACALCIN/FORTICAL) 200 UNIT/ACT nasal spray Place 1 spray into alternate nostrils daily. 06/13/16   Historical Provider, MD  citalopram (CELEXA) 40 MG tablet Take 1 tablet (40 mg total) by mouth 2 (two) times daily. 09/19/16   Unk Pinto, MD  cyclobenzaprine (FLEXERIL) 10 MG tablet Take 1 tablet (10 mg total) by mouth 3 (three) times daily as needed for muscle spasms. 03/06/16   Unk Pinto, MD  ferrous sulfate 325 (65 FE) MG tablet Take 325 mg by mouth daily with breakfast.    Historical Provider, MD  furosemide (LASIX) 80 MG tablet Take 80 mg in the morning and one-half tablet (40 mg) in the evenings 03/06/16   Vicie Mutters, PA-C  gabapentin (NEURONTIN) 300 MG capsule Take 1 capsule by mouth in the AM, 1 capsule in the afternoon and 1-3 capsules at night.120 10/01/16   Unk Pinto, MD  ipratropium (ATROVENT) 0.02 % nebulizer solution use 1 vial in nebulizer EVERY 4 HOURS AS NEEDED FOR WHEEZING OR SHORTNESS OF BREATH 08/20/16   Unk Pinto, MD  Magnesium 250 MG TABS Take 250 mg by mouth at bedtime.      Historical Provider, MD  meclizine (ANTIVERT) 25 MG tablet Take 1/2 to 12 tablet 2 or 3 x/ day if needed for Vertigo 09/04/16   Unk Pinto, MD  metolazone (ZAROXOLYN) 5 MG tablet take daily with lasix or use as directed for fluid overload 10/09/16   Vicie Mutters, PA-C  OXYGEN Inhale 3 L/min into the lungs continuous.     Historical Provider, MD  pantoprazole (PROTONIX) 40 MG tablet Take 1 tablet (40 mg total) by mouth 2 (two) times daily. 06/25/16   Unk Pinto, MD  potassium chloride (K-DUR) 10 MEQ tablet Take 6 tablets (60 mEq total) by mouth daily. Patient taking differently: Take 2 tablets (40 mEq total) by mouth three time daily. 06/26/16   Unk Pinto, MD  predniSONE (DELTASONE) 10 MG tablet 1 tab 3 x  day for 3 days, then 1 tab 2 x day for 3 days, then 1 tab 1 x day for 5 days 09/04/16   Unk Pinto, MD  rifaximin (XIFAXAN) 550 MG TABS tablet Take 550 mg by mouth daily as needed (IBS/diarrhea symptoms).     Historical Provider, MD  traMADol (ULTRAM) 50 MG tablet take 1 TABLET BY MOUTH EVERY 8 HOURS Patient taking differently: TAKE 50 MG BY MOUTH EVERY 8 HOURS AS NEEDED FOR PAIN 12/28/15   Loma Sousa Forcucci, PA-C  triamcinolone cream (KENALOG) 0.1 % Apply 1 application topically 3 (three) times daily as needed for itching. 03/22/16   Historical Provider, MD    Family History Family History  Problem Relation Age of Onset  . Diabetes Brother     deceased  . Heart disease Sister     A Fib  . Heart disease Mother     CHF  . Atrial fibrillation Sister   . Colon cancer Neg Hx     Social History Social History  Substance Use Topics  . Smoking status: Former Smoker    Packs/day: 1.00    Years: 50.00    Types: Cigarettes    Quit date: 11/05/2014  . Smokeless tobacco: Never Used     Comment: Quit April 2016  . Alcohol use No     Allergies   Atorvastatin; Diphenhydramine hcl (sleep); Hydrocodone-acetaminophen; Lopid [gemfibrozil]; Loratadine; Lorazepam; Simvastatin;  Sulfamethoxazole; and Sulfonamide derivatives   Review of Systems Review of Systems  Constitutional: Positive for fever.  HENT: Positive for congestion, rhinorrhea and sinus pressure.   Respiratory: Positive for shortness of breath. Negative for cough and wheezing.   Cardiovascular: Positive for chest pain.  Gastrointestinal: Positive for abdominal pain.  Genitourinary: Negative for dysuria.  Musculoskeletal: Negative for myalgias.  All other systems reviewed and are negative.    Physical Exam Updated Vital Signs BP (!) 126/49 (BP Location: Left Arm)   Pulse (!) 58   Temp 98 F (36.7 C) (Oral)   Resp 18   Ht 5' 2.5" (1.588 m)   Wt 78 kg   SpO2 98%   BMI 30.96 kg/m   Physical Exam  Constitutional: She is oriented to person, place, and time. She appears well-developed and well-nourished. No distress.  Sitting comfortably in bed, appears stated age   HENT:  Head: Normocephalic and atraumatic.  Eyes: Conjunctivae are normal. No scleral icterus.  Cardiovascular: Normal rate and regular rhythm.   No murmur heard. Pulmonary/Chest: Effort normal. No respiratory distress. She has no wheezes. She exhibits tenderness.  Right basilar fine crackles   Abdominal: Bowel sounds are normal. She exhibits distension. There is tenderness. There is no guarding.  No fluid wave  Neurological: She is alert and oriented to person, place, and time.  Skin: Skin is warm and dry. She is not diaphoretic.  Psychiatric: She has a normal mood and affect. Her behavior is normal.    ED Treatments / Results  Labs (all labs ordered are listed, but only abnormal results are displayed) Labs Reviewed  COMPREHENSIVE METABOLIC PANEL - Abnormal; Notable for the following:       Result Value   Potassium 2.9 (*)    Chloride 90 (*)    CO2 35 (*)    Glucose, Bld 194 (*)    Creatinine, Ser 1.24 (*)    Total Protein 5.4 (*)    Albumin 2.9 (*)    Alkaline Phosphatase 133 (*)    GFR calc non Af Amer 43 (*)  GFR calc Af Amer 50 (*)    All other components within normal limits  CBC WITH DIFFERENTIAL/PLATELET - Abnormal; Notable for the following:    RBC 3.25 (*)    Hemoglobin 10.4 (*)    HCT 32.7 (*)    MCV 100.6 (*)    Platelets 76 (*)    All other components within normal limits  AMMONIA - Abnormal; Notable for the following:    Ammonia 78 (*)    All other components within normal limits  I-STAT CG4 LACTIC ACID, ED - Abnormal; Notable for the following:    Lactic Acid, Venous 2.86 (*)    All other components within normal limits  PROTIME-INR  URINALYSIS, ROUTINE W REFLEX MICROSCOPIC    EKG  EKG Interpretation None       Radiology Dg Chest 2 View  Result Date: 10/12/2016 CLINICAL DATA:  Fever for 1 week.  Nausea. EXAM: CHEST  2 VIEW COMPARISON:  01/19/2016 FINDINGS: There is mild right lower lobe airspace disease which may reflect atelectasis versus pneumonia. There is no pleural effusion or pneumothorax. The heart and mediastinal contours are unremarkable. The osseous structures are unremarkable. IMPRESSION: Mild right lower lobe airspace disease which may reflect atelectasis versus pneumonia. Followup PA and lateral chest X-ray is recommended in 3-4 weeks following trial of antibiotic therapy to ensure resolution and exclude underlying malignancy. Electronically Signed   By: Kathreen Devoid   On: 10/12/2016 14:59    Procedures Procedures (including critical care time)  Medications Ordered in ED Medications - No data to display   Initial Impression / Assessment and Plan / ED Course  I have reviewed the triage vital signs and the nursing notes.  Pertinent labs & imaging results that were available during my care of the patient were reviewed by me and considered in my medical decision making (see chart for details).   71 year old woman with PMH GERD, gout, COPD GOLD II, diastolic dysfunction, and NASH presents with 1 week of temperature with tmax 101 at home. Lactic acid on  arrival 2.8.  She denies symptoms of UTI, urinalysis revealed negative nitrites and leukocytes. Reports symptoms of sinusitis and was found to have RRL abnormality that could be related to pneumonia vs atelectasis. Of note, she was last hospitalized 07/2016 for possible SBP and was treated with ceftriaxone. It is possible that she has developed hospital pneumonia. Ordered blood cultures, vancomycin and zosyn.  Ordered abdominal ultrasound to evaluate for peritonitis.  Chest pressure which changed with exertion and relieved with rest was concerning for ACS, EKG showed sinus rhythm with RBBB unchanged from prior, I stat troponin negative.  Spoke with [facility]ad hospitalist, they will admit.   Final Clinical Impressions(s) / ED Diagnoses   Final diagnoses:  None    New Prescriptions New Prescriptions   No medications on file     Ledell Noss, MD 10/12/16 1749    Malvin Johns, MD 10/12/16 2036

## 2016-10-12 NOTE — Progress Notes (Addendum)
Pharmacy Antibiotic Note  [name] is a 71 y.o. female admitted on 10/12/2016 with pneumonia.  Pharmacy has been consulted for vancomycin dosing.  Patient is afebrile with WBC of 6.0 on admission to ED. CrCL ~ 41 mL/min, with serum creatinine slightly higher than baseline.  Plan: Vancomycin 1531m IV x1, then 7550mIV every 12 hours Zosyn 4.5gm IV x1 given in ED, follow up plans for gram negative coverage Monitor clinical progress, renal function, VT as indicated  Height: 5' 2.5" (158.8 cm) Weight: 172 lb (78 kg) IBW/kg (Calculated) : 51.25  Temp (24hrs), Avg:98.1 F (36.7 C), Min:98 F (36.7 C), Max:98.2 F (36.8 C)   Recent Labs Lab 10/12/16 1425 10/12/16 1439  WBC 6.0  --   CREATININE 1.24*  --   LATICACIDVEN  --  2.86*    Estimated Creatinine Clearance: 41.9 mL/min (by C-G formula based on SCr of 1.24 mg/dL (H)).    Allergies  Allergen Reactions  . Atorvastatin Other (See Comments)    Doesn't remember  . Diphenhydramine Hcl (Sleep) Hives  . Hydrocodone-Acetaminophen Other (See Comments)    Doesn't remember  . Lopid [Gemfibrozil] Other (See Comments)    Doesn't remember  . Loratadine Hives  . Lorazepam Hives  . Simvastatin Other (See Comments)  . Sulfamethoxazole Hives  . Sulfonamide Derivatives Hives    Antimicrobials this admission: 3/9 zosyn x1 3/9 vanco >>   Thank you for allowing pharmacy to be a part of this patient's care.  MaDemetrius CharityPharmD Acute Care Pharmacy Resident  Pager: [phone]/04/2017   ADDENDUM:  Pharmacy to start Zosyn as well. Given Zosyn x 1 in the ED around 1800.  Plan: Start Zosyn 3.375 gm IV q8h (4 hour infusion)  NaElenor QuinonesPharmD, BCMoundview Mem Hsptl And Clinicslinical Pharmacist Pager [phone]/04/2017 6:59 PM

## 2016-10-12 NOTE — Telephone Encounter (Signed)
Pete from [facility] was visiting the patient and reported a temperature of 100.9 degrees.  Per Dr Carylon Perches patient needs to go to the ER to be evaluated, due to her medical conditions.  Laurey Arrow and the patient's spouse are aware.

## 2016-10-13 ENCOUNTER — Encounter (HOSPITAL_COMMUNITY): Payer: Self-pay | Admitting: *Deleted

## 2016-10-13 LAB — LACTIC ACID, PLASMA: Lactic Acid, Venous: 1.8 mmol/L (ref 0.5–1.9)

## 2016-10-13 LAB — HEMOGLOBIN A1C
Hgb A1c MFr Bld: 6.9 % — ABNORMAL HIGH (ref 4.8–5.6)
Mean Plasma Glucose: 151 mg/dL

## 2016-10-13 LAB — BASIC METABOLIC PANEL
ANION GAP: 8 (ref 5–15)
BUN: 14 mg/dL (ref 6–20)
CALCIUM: 8.6 mg/dL — AB (ref 8.9–10.3)
CO2: 36 mmol/L — ABNORMAL HIGH (ref 22–32)
Chloride: 96 mmol/L — ABNORMAL LOW (ref 101–111)
Creatinine, Ser: 1.24 mg/dL — ABNORMAL HIGH (ref 0.44–1.00)
GFR calc Af Amer: 50 mL/min — ABNORMAL LOW (ref >60)
GFR calc non Af Amer: 43 mL/min — ABNORMAL LOW (ref >60)
Glucose, Bld: 137 mg/dL — ABNORMAL HIGH (ref 65–99)
POTASSIUM: 3.2 mmol/L — AB (ref 3.5–5.1)
SODIUM: 140 mmol/L (ref 135–145)

## 2016-10-13 LAB — TROPONIN I: Troponin I: <0.03 ng/mL (ref <0.03)

## 2016-10-13 LAB — GLUCOSE, CAPILLARY
GLUCOSE-CAPILLARY: 127 mg/dL — AB (ref 65–99)
GLUCOSE-CAPILLARY: 200 mg/dL — AB (ref 65–99)
GLUCOSE-CAPILLARY: 218 mg/dL — AB (ref 65–99)
Glucose-Capillary: 254 mg/dL — ABNORMAL HIGH (ref 65–99)

## 2016-10-13 NOTE — Progress Notes (Signed)
Progress Note    [name]  JQZ:[id number] DOB: 26-May-1946  DOA: 10/12/2016 PCP: Alesia Richards, MD    Brief Narrative:   Chief complaint: Follow-up fever concerning for sepsis  Medical records reviewed and are as summarized below:  [name] is an 71 y.o. female with a PMH of COPD and chronic respiratory failure on 3 L of oxygen, hypertension, hyperlipidemia, NASH/cirrhosis of the liver on diuretic therapy, stage II chronic kidney disease, chronic diastolic CHF (grade 1 diastolic dysfunction, EF 62-26 percent on recent echo) and prediabetes on metformin who Was admitted 10/12/16 with concerns for sepsis given fever. The patient's Lab work was significant for a potassium of 2.9, creatinine of 1.24, a WBC of 6.0, platelet count 76, ammonia 78, lactic acid 2.86, urinalysis negative for nitrites and leukocytes. Chest x-ray showed mild right lower lobe airspace disease. Vital signs were stable and the patient was afebrile.  Assessment/Plan:   Principal Problem:   Rule out Sepsis (HCC)/lactic acidosis/probable HCAP Patient noted to be febrile in her home earlier today and on presentation had lactic acidosis. Has multiple potential etiologies for infection including HCAP, SBP or an oral infection. Blood cultures were sent. Abdominal ultrasound negative for ascites. Although she has chronic shortness of breath, there has not been any change in her respiratory status from baseline and she does not have any new cough or sputum production making HCAP less likely, although chest x-ray does show mild right lower lobe airspace disease and she does report some chest pressure. Llimited maxillofacial CT only showed sequelae of chronic otomastoiditis. We'll continue vancomycin and Zosyn for now. Narrow antibiotics based on culture data when able.    Active Problems:   Hepatic cirrhosis (HCC)/hyperammonemia The patient is on chronic Lasix, rifaximin, Zaroxolyn, lactulose. Hold Lasix and  Zaroxolyn given severe hypokalemia. Elevated ammonia level noted, but patient currently alert. Monitor closely for developing encephalopathy.    Essential hypertension Hold diuretics for now given severe hypokalemia.    Chronic respiratory failure with hypoxia and hypercapnia (HCC) Continue supplemental oxygen.    Thrombocytopenia ([place]) Secondary to cirrhosis. Monitor for signs of bleeding. Continue rifaximin and lactulose.    Hypokalemia  Likely from chronic diuretic and lactulose use. Magnesium OK. Repleting.    DM type 2 causing CKD stage 3 (HCC) Baseline creatinine appears to be around 1.1. Current creatinine 1.24. SSI, insulin sensitive scale ordered. Hemoglobin A1C is 6.9%.    Obesity (BMI 30-39.9) Body mass index is 30.96 kg/m.   Family Communication/Anticipated D/C date and plan/Code Status   DVT prophylaxis: Lovenox ordered. Code Status: Full Code.  Family Communication: No family at bedside. Disposition Plan: Home 10/14/16 if she remains afebrile & culture data negative.   Medical Consultants:    None.   Procedures:    None  Anti-Infectives:    Vancomycin 10/12/16--->  Zosyn 10/12/16--->  Subjective:   Feels OK. Reports some tightness of the chest and an occasional cough.  Abdominal pain improved.  No nausea or vomiting.  Objective:    Vitals:   10/12/16 1800 10/12/16 2133 10/13/16 0513 10/13/16 1005  BP: 128/57 122/66 (!) 118/48 (!) 114/43  Pulse:  66 68 66  Resp:  18 18 18   Temp:  98 F (36.7 C) 98.9 F (37.2 C) 98.8 F (37.1 C)  TempSrc:  Oral Oral Oral  SpO2:  100% 97% 98%  Weight:  78.5 kg (173 lb 1.6 oz)    Height:  5' 2"  (1.575 m)  Intake/Output Summary (Last 24 hours) at 10/13/16 1036 Last data filed at 10/13/16 0900  Gross per 24 hour  Intake              680 ml  Output              450 ml  Net              230 ml   Filed Weights   10/12/16 1416 10/12/16 2133  Weight: 78 kg (172 lb) 78.5 kg (173 lb 1.6 oz)     Exam: General exam: Appears calm and comfortable.  Respiratory system: Clear to auscultation. Respiratory effort normal. Cardiovascular system: S1 & S2 heard, RRR. No JVD,  rubs, gallops or clicks. No murmurs. Gastrointestinal system: Abdomen is nondistended, soft and nontender. No organomegaly or masses felt. Normal bowel sounds heard. Central nervous system: Alert and oriented. No focal neurological deficits. Extremities: No clubbing,  or cyanosis. No edema. Skin: No rashes, lesions or ulcers. Psychiatry: Judgement and insight appear normal. Mood & affect appropriate.   Data Reviewed:   I have personally reviewed following labs and imaging studies:  Labs: Basic Metabolic Panel:  Recent Labs Lab 10/12/16 1425 10/12/16 1902 10/13/16 0656  NA 135  --  140  K 2.9*  --  3.2*  CL 90*  --  96*  CO2 35*  --  36*  GLUCOSE 194*  --  137*  BUN 13  --  14  CREATININE 1.24*  --  1.24*  CALCIUM 9.0  --  8.6*  MG  --  1.9  --    GFR Estimated Creatinine Clearance: 41.6 mL/min (by C-G formula based on SCr of 1.24 mg/dL (H)). Liver Function Tests:  Recent Labs Lab 10/12/16 1425  AST 40  ALT 19  ALKPHOS 133*  BILITOT 1.1  PROT 5.4*  ALBUMIN 2.9*   No results for input(s): LIPASE, AMYLASE in the last 168 hours.  Recent Labs Lab 10/12/16 1437  AMMONIA 78*   Coagulation profile  Recent Labs Lab 10/12/16 1425  INR 1.12    CBC:  Recent Labs Lab 10/12/16 1425  WBC 6.0  NEUTROABS 4.6  HGB 10.4*  HCT 32.7*  MCV 100.6*  PLT 76*   Cardiac Enzymes:  Recent Labs Lab 10/12/16 1902 10/13/16 0033 10/13/16 0656  TROPONINI <0.03 <0.03 <0.03   BNP (last 3 results) No results for input(s): PROBNP in the last 8760 hours. CBG:  Recent Labs Lab 10/12/16 2135 10/13/16 0744  GLUCAP 204* 127*   D-Dimer: No results for input(s): DDIMER in the last 72 hours. Hgb A1c:  Recent Labs  10/12/16 1902  HGBA1C 6.9*   Lipid Profile: No results for input(s):  CHOL, HDL, LDLCALC, TRIG, CHOLHDL, LDLDIRECT in the last 72 hours. Thyroid function studies: No results for input(s): TSH, T4TOTAL, T3FREE, THYROIDAB in the last 72 hours.  Invalid input(s): FREET3 Anemia work up: No results for input(s): VITAMINB12, FOLATE, FERRITIN, TIBC, IRON, RETICCTPCT in the last 72 hours. Sepsis Labs:  Recent Labs Lab 10/12/16 1425 10/12/16 1439  WBC 6.0  --   LATICACIDVEN  --  2.86*    Microbiology Recent Results (from the past 240 hour(s))  Blood culture (routine x 2)     Status: None (Preliminary result)   Collection Time: 10/12/16  5:04 PM  Result Value Ref Range Status   Specimen Description BLOOD LEFT ANTECUBITAL  Final   Special Requests BOTTLES DRAWN AEROBIC AND ANAEROBIC 5CC  Final   Culture NO GROWTH <  24 HOURS  Final   Report Status PENDING  Incomplete  Blood culture (routine x 2)     Status: None (Preliminary result)   Collection Time: 10/12/16  5:25 PM  Result Value Ref Range Status   Specimen Description BLOOD LEFT ARM  Final   Special Requests BOTTLES DRAWN AEROBIC AND ANAEROBIC 5CC  Final   Culture NO GROWTH < 24 HOURS  Final   Report Status PENDING  Incomplete    Radiology: Dg Chest 2 View  Result Date: 10/12/2016 CLINICAL DATA:  Fever for 1 week.  Nausea. EXAM: CHEST  2 VIEW COMPARISON:  01/19/2016 FINDINGS: There is mild right lower lobe airspace disease which may reflect atelectasis versus pneumonia. There is no pleural effusion or pneumothorax. The heart and mediastinal contours are unremarkable. The osseous structures are unremarkable. IMPRESSION: Mild right lower lobe airspace disease which may reflect atelectasis versus pneumonia. Followup PA and lateral chest X-ray is recommended in 3-4 weeks following trial of antibiotic therapy to ensure resolution and exclude underlying malignancy. Electronically Signed   By: Kathreen Devoid   On: 10/12/2016 14:59   US Abdomen Limited  Result Date: 10/12/2016 CLINICAL DATA:  Cirrhosis with  paraesophageal varices and splenomegaly. Abdominal pain. EXAM: LIMITED ABDOMEN ULTRASOUND FOR ASCITES TECHNIQUE: Limited ultrasound survey for ascites was performed in all four abdominal quadrants. COMPARISON:  Abdominal CT 07/11/2016 FINDINGS: No varices are present. An extra renal collecting system is again seen in the right kidney. The spleen is enlarged. IMPRESSION: 1. No abdominal ascites. 2. 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 3. Splenomegaly. Electronically Signed   By: San Morelle M.D.   On: 10/12/2016 20:21   Ct Maxillofacial Wo Contrast  Result Date: 10/12/2016 CLINICAL DATA:  71 y/o F; recent root canal with complaint of lower jaw soreness and sinus pain. EXAM: CT MAXILLOFACIAL WITHOUT CONTRAST TECHNIQUE: Multidetector CT imaging of the maxillofacial structures was performed. Multiplanar CT image reconstructions were also generated. A small metallic BB was placed on the right temple in order to reliably differentiate right from left. COMPARISON:  09/03/2014 CT of the head. FINDINGS: Osseous: No fracture or mandibular dislocation. No destructive process. Orbits: No traumatic or inflammatory finding. Bilateral intra-ocular lens replacement. Sinuses: Bilateral mastoid tip opacification and sclerosis compatible sequelae of chronic otomastoiditis. Mastoid air cells are otherwise normally aerated. Borderline high-riding right jugular bulb. Paranasal sinuses are normally aerated. Soft tissues: Negative. Limited intracranial: No significant or unexpected finding. IMPRESSION: 1. No bony fracture or destructive process. Normal soft tissues. Normally aerated paranasal sinuses. 2. No finding as explanation for pain identified. Electronically Signed   By: Kristine Garbe M.D.   On: 10/12/2016 22:47    Medications:   . allopurinol  300 mg Oral Daily  . aspirin EC  81 mg Oral QHS  . bisoprolol  10 mg Oral Daily  . ferrous sulfate  325 mg Oral Q1200  . gabapentin  300 mg  Oral TID  . insulin aspart  0-5 Units Subcutaneous QHS  . insulin aspart  0-9 Units Subcutaneous TID WC  . mouth rinse  15 mL Mouth Rinse BID  . piperacillin-tazobactam (ZOSYN)  IV  3.375 g Intravenous Q8H  . potassium chloride  40 mEq Oral BID  . sodium chloride flush  3 mL Intravenous Q12H  . vancomycin  750 mg Intravenous Q12H   Continuous Infusions:  Medical decision making is of high complexity and this patient is at high risk of deterioration, therefore this is a level 3 visit.  (>  4 problem points, 2 data points, high risk)    LOS: 1 day   Aaiden Depoy  [facility] Pager [phone]. If unable to reach me by pager, please call my cell phone at [phone].  *Please refer to [email or web address], password [email or web address] to get updated schedule on who will round on this patient, as hospitalists switch teams weekly. If 7PM-7AM, please contact night-coverage at [email or web address], password [email or web address] for any overnight needs.  10/13/2016, 10:36 AM

## 2016-10-14 LAB — BASIC METABOLIC PANEL
ANION GAP: 5 (ref 5–15)
BUN: 14 mg/dL (ref 6–20)
CALCIUM: 8.6 mg/dL — AB (ref 8.9–10.3)
CO2: 35 mmol/L — ABNORMAL HIGH (ref 22–32)
CREATININE: 1.3 mg/dL — AB (ref 0.44–1.00)
Chloride: 102 mmol/L (ref 101–111)
GFR calc Af Amer: 47 mL/min — ABNORMAL LOW (ref >60)
GFR, EST NON AFRICAN AMERICAN: 41 mL/min — AB (ref >60)
GLUCOSE: 161 mg/dL — AB (ref 65–99)
Potassium: 3.9 mmol/L (ref 3.5–5.1)
Sodium: 142 mmol/L (ref 135–145)

## 2016-10-14 LAB — GLUCOSE, CAPILLARY: Glucose-Capillary: 142 mg/dL — ABNORMAL HIGH (ref 65–99)

## 2016-10-14 MED ORDER — LEVOFLOXACIN 750 MG PO TABS: 750.0000 mg | ORAL_TABLET | Freq: Every day | ORAL | 0 refills | Status: DC

## 2016-10-14 MED ORDER — CITALOPRAM HYDROBROMIDE 40 MG PO TABS: 40.0000 mg | ORAL_TABLET | Freq: Every day | ORAL | Status: DC

## 2016-10-14 MED ORDER — GABAPENTIN 300 MG PO CAPS: 300.0000 mg | ORAL_CAPSULE | Freq: Three times a day (TID) | ORAL | Status: DC

## 2016-10-14 MED ORDER — FUROSEMIDE 80 MG PO TABS: 40.0000 mg | ORAL_TABLET | ORAL | Status: DC

## 2016-10-14 MED ORDER — MECLIZINE HCL 25 MG PO TABS: 25.0000 mg | ORAL_TABLET | Freq: Two times a day (BID) | ORAL | Status: DC

## 2016-10-14 MED ORDER — AMOXICILLIN-POT CLAVULANATE 875-125 MG PO TABS: 1.0000 | ORAL_TABLET | Freq: Two times a day (BID) | ORAL | 0 refills | Status: DC

## 2016-10-14 MED ORDER — METOLAZONE 5 MG PO TABS: ORAL_TABLET | ORAL | 1 refills | Status: DC

## 2016-10-14 MED ORDER — ALLOPURINOL 300 MG PO TABS: ORAL_TABLET | ORAL | 1 refills | Status: DC

## 2016-10-14 MED ORDER — IPRATROPIUM BROMIDE 0.02 % IN SOLN: 0.5000 mg | RESPIRATORY_TRACT | Status: DC | PRN

## 2016-10-14 MED ORDER — POTASSIUM CHLORIDE ER 10 MEQ PO TBCR: EXTENDED_RELEASE_TABLET | ORAL | 3 refills | Status: DC

## 2016-10-14 MED ORDER — TRAMADOL HCL 50 MG PO TABS: ORAL_TABLET | ORAL | 1 refills | Status: DC

## 2016-10-14 MED ORDER — PANTOPRAZOLE SODIUM 40 MG PO TBEC: DELAYED_RELEASE_TABLET | ORAL | 1 refills | Status: DC

## 2016-10-14 MED ORDER — LEVOFLOXACIN 750 MG PO TABS: 750.0000 mg | ORAL_TABLET | Freq: Every day | ORAL | Status: DC

## 2016-10-14 MED ORDER — AMOXICILLIN-POT CLAVULANATE 875-125 MG PO TABS
1.0000 | ORAL_TABLET | Freq: Two times a day (BID) | ORAL | Status: DC
  Administered 2016-10-14: 1 via ORAL
  Filled 2016-10-14: qty 1

## 2016-10-14 MED ORDER — POTASSIUM CHLORIDE ER 20 MEQ PO TBCR: 40.0000 meq | EXTENDED_RELEASE_TABLET | Freq: Two times a day (BID) | ORAL | Status: DC

## 2016-10-14 NOTE — Progress Notes (Signed)
Patient discharge teaching given, including activity, diet, follow-up appoints, and medications. Patient verbalized understanding of all discharge instructions. IV access was d/c'd. Vitals are stable. Skin is intact except as charted in most recent assessments. Pt to be escorted out by NT, to be driven home by family.  Jillyn Ledger, MBA, BSN, RN

## 2016-10-14 NOTE — Discharge Summary (Signed)
Physician Discharge Summary  [name] AJO:[id number] DOB: 1945/10/05 DOA: 10/12/2016  PCP: Alesia Richards, MD  Admit date: 10/12/2016 Discharge date: 10/14/2016  Admitted From: Home Discharge disposition: Home   Recommendations for Outpatient Follow-Up:   1. F/U with PCP in 1 week. Please F/U on final blood culture results and re-check potassium.   Discharge Diagnosis:   Principal Problem:    HCAP (healthcare-associated pneumonia), sepsis ruled out Active Problems:    Hepatic cirrhosis ([place])    Essential hypertension    Chronic respiratory failure with hypoxia and hypercapnia (HCC)    Thrombocytopenia (HCC)    Hypokalemia    DM type 2 causing CKD stage 3 (HCC)    Lactic acidosis    Obesity (BMI 30-39.9)    Fever    Hyperammonemia ([place])   Discharge Condition: Improved.  Diet recommendation: Low sodium, heart healthy.    History of Present Illness:   [name] is an 71 y.o. female with a PMH of COPD and chronic respiratory failure on 3 L of oxygen, hypertension, hyperlipidemia, NASH/cirrhosis of the liver on diuretic therapy, stage II chronic kidney disease, chronic diastolic CHF (grade 1 diastolic dysfunction, EF 94-70 percent on recent echo) and prediabetes on metformin who Was admitted 10/12/16 with concerns for sepsis given fever. The patient's Lab work was significant for a potassium of 2.9, creatinine of 1.24, a WBC of 6.0, platelet count 76, ammonia 78, lactic acid 2.86, urinalysis negative for nitrites and leukocytes. Chest x-ray showed mild right lower lobe airspace disease. Vital signs were stable and the patient was afebrile.  Hospital Course by Problem:   Principal Problem: HCAP with lactic acidosis, sepsis ruled out Patient noted to be febrile in her home earlier today and on presentation had lactic acidosis but no documented fever, tachycardia or tachypnea. Blood cultures negative to date. Abdominal ultrasound negative for ascites.  Chest x-ray did show mild right lower lobe airspace disease and she did report some chest pressure. Llimited maxillofacial CT only showed sequelae of chronic otomastoiditis. Treated with vancomycin and Zosyn while in the hospital, discharge home on an additional 5 days of Augmentin.    Active Problems: Hepatic cirrhosis (HCC)/hyperammonemia The patient is on chronic Lasix, rifaximin, Zaroxolyn, lactulose.We held her Lasix and Zaroxolyn given her severe hypokalemia. Elevated ammonia level noted, but patient did not have any evidence of encephalopathy.  Essential hypertension OK to resume diuretics at discharge.  Chronic respiratory failure with hypoxia and hypercapnia (HCC) Continue supplemental oxygen.  Thrombocytopenia ([place]) Secondary to cirrhosis. No signs of bleeding. Continue lactulose.  Hypokalemia Likely from chronic diuretic and lactulose use. Magnesium OK. Repleted.  DM type 2 causing CKD stage 3 (HCC) Baseline creatinine appears to be around 1.1. Current creatinine 1.24. SSI, insulin sensitive scale ordered. Hemoglobin A1C is 6.9%.  Obesity (BMI 30-39.9) Body mass index is 30.96 kg/m.   Medical Consultants:    None.   Discharge Exam:   Vitals:   10/13/16 2145 10/14/16 0525  BP: (!) 120/42 (!) 120/51  Pulse: 74 67  Resp: 18 18  Temp: 99.4 F (37.4 C) 98.5 F (36.9 C)   Vitals:   10/13/16 1005 10/13/16 1652 10/13/16 2145 10/14/16 0525  BP: (!) 114/43 (!) 112/47 (!) 120/42 (!) 120/51  Pulse: 66 68 74 67  Resp: 18 18 18 18   Temp: 98.8 F (37.1 C) 98.7 F (37.1 C) 99.4 F (37.4 C) 98.5 F (36.9 C)  TempSrc: Oral Oral Oral Oral  SpO2: 98% 98% 99% 98%  Weight:  79.5 kg (175 lb 3.2 oz)   Height:        General exam: Appears calm and comfortable.  Respiratory system: Clear to auscultation. Respiratory effort normal. Cardiovascular system: S1 & S2 heard, RRR. No JVD,  rubs, gallops or clicks. No murmurs. Gastrointestinal system:  Abdomen is nondistended, soft and nontender. No organomegaly or masses felt. Normal bowel sounds heard. Central nervous system: Alert and oriented. No focal neurological deficits. Extremities: No clubbing,  or cyanosis. 1+ LE edema. Skin: Venous stasis to BLE. Psychiatry: Judgement and insight appear normal. Mood & affect appropriate.    The results of significant diagnostics from this hospitalization (including imaging, microbiology, ancillary and laboratory) are listed below for reference.     Procedures and Diagnostic Studies:   Dg Chest 2 View  Result Date: 10/12/2016 CLINICAL DATA:  Fever for 1 week.  Nausea. EXAM: CHEST  2 VIEW COMPARISON:  01/19/2016 FINDINGS: There is mild right lower lobe airspace disease which may reflect atelectasis versus pneumonia. There is no pleural effusion or pneumothorax. The heart and mediastinal contours are unremarkable. The osseous structures are unremarkable. IMPRESSION: Mild right lower lobe airspace disease which may reflect atelectasis versus pneumonia. Followup PA and lateral chest X-ray is recommended in 3-4 weeks following trial of antibiotic therapy to ensure resolution and exclude underlying malignancy. Electronically Signed   By: Kathreen Devoid   On: 10/12/2016 14:59   US Abdomen Limited  Result Date: 10/12/2016 CLINICAL DATA:  Cirrhosis with paraesophageal varices and splenomegaly. Abdominal pain. EXAM: LIMITED ABDOMEN ULTRASOUND FOR ASCITES TECHNIQUE: Limited ultrasound survey for ascites was performed in all four abdominal quadrants. COMPARISON:  Abdominal CT 07/11/2016 FINDINGS: No varices are present. An extra renal collecting system is again seen in the right kidney. The spleen is enlarged. IMPRESSION: 1. No abdominal ascites. 2. 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 3. Splenomegaly. Electronically Signed   By: San Morelle M.D.   On: 10/12/2016 20:21   Ct Maxillofacial Wo Contrast  Result Date: 10/12/2016 CLINICAL DATA:   71 y/o F; recent root canal with complaint of lower jaw soreness and sinus pain. EXAM: CT MAXILLOFACIAL WITHOUT CONTRAST TECHNIQUE: Multidetector CT imaging of the maxillofacial structures was performed. Multiplanar CT image reconstructions were also generated. A small metallic BB was placed on the right temple in order to reliably differentiate right from left. COMPARISON:  09/03/2014 CT of the head. FINDINGS: Osseous: No fracture or mandibular dislocation. No destructive process. Orbits: No traumatic or inflammatory finding. Bilateral intra-ocular lens replacement. Sinuses: Bilateral mastoid tip opacification and sclerosis compatible sequelae of chronic otomastoiditis. Mastoid air cells are otherwise normally aerated. Borderline high-riding right jugular bulb. Paranasal sinuses are normally aerated. Soft tissues: Negative. Limited intracranial: No significant or unexpected finding. IMPRESSION: 1. No bony fracture or destructive process. Normal soft tissues. Normally aerated paranasal sinuses. 2. No finding as explanation for pain identified. Electronically Signed   By: Kristine Garbe M.D.   On: 10/12/2016 22:47     Labs:   Basic Metabolic Panel:  Recent Labs Lab 10/12/16 1425 10/12/16 1902 10/13/16 0656 10/14/16 0611  NA 135  --  140 142  K 2.9*  --  3.2* 3.9  CL 90*  --  96* 102  CO2 35*  --  36* 35*  GLUCOSE 194*  --  137* 161*  BUN 13  --  14 14  CREATININE 1.24*  --  1.24* 1.30*  CALCIUM 9.0  --  8.6* 8.6*  MG  --  1.9  --   --  GFR Estimated Creatinine Clearance: 39.9 mL/min (by C-G formula based on SCr of 1.3 mg/dL (H)). Liver Function Tests:  Recent Labs Lab 10/12/16 1425  AST 40  ALT 19  ALKPHOS 133*  BILITOT 1.1  PROT 5.4*  ALBUMIN 2.9*    Recent Labs Lab 10/12/16 1437  AMMONIA 78*   Coagulation profile  Recent Labs Lab 10/12/16 1425  INR 1.12    CBC:  Recent Labs Lab 10/12/16 1425  WBC 6.0  NEUTROABS 4.6  HGB 10.4*  HCT 32.7*  MCV  100.6*  PLT 76*   Cardiac Enzymes:  Recent Labs Lab 10/12/16 1902 10/13/16 0033 10/13/16 0656  TROPONINI <0.03 <0.03 <0.03  CBG:  Recent Labs Lab 10/13/16 0744 10/13/16 1236 10/13/16 1649 10/13/16 2142 10/14/16 0754  GLUCAP 127* 200* 218* 254* 142*   Hgb A1c  Recent Labs  10/12/16 1902  HGBA1C 6.9*   Microbiology Recent Results (from the past 240 hour(s))  Blood culture (routine x 2)     Status: None (Preliminary result)   Collection Time: 10/12/16  5:04 PM  Result Value Ref Range Status   Specimen Description BLOOD LEFT ANTECUBITAL  Final   Special Requests BOTTLES DRAWN AEROBIC AND ANAEROBIC 5CC  Final   Culture NO GROWTH < 24 HOURS  Final   Report Status PENDING  Incomplete  Blood culture (routine x 2)     Status: None (Preliminary result)   Collection Time: 10/12/16  5:25 PM  Result Value Ref Range Status   Specimen Description BLOOD LEFT ARM  Final   Special Requests BOTTLES DRAWN AEROBIC AND ANAEROBIC 5CC  Final   Culture NO GROWTH < 24 HOURS  Final   Report Status PENDING  Incomplete     Discharge Instructions:   Discharge Instructions    Call MD for:  extreme fatigue    Complete by:  As directed    Call MD for:  persistant dizziness or light-headedness    Complete by:  As directed    Call MD for:  persistant nausea and vomiting    Complete by:  As directed    Call MD for:  severe uncontrolled pain    Complete by:  As directed    Call MD for:  temperature >100.4    Complete by:  As directed    Diet - low sodium heart healthy    Complete by:  As directed    Increase activity slowly    Complete by:  As directed      Allergies as of 10/14/2016      Reactions   Atorvastatin Other (See Comments)   Doesn't remember   Diphenhydramine Hcl (sleep) Hives   Hydrocodone-acetaminophen Other (See Comments)   Doesn't remember   Lopid [gemfibrozil] Other (See Comments)   Doesn't remember   Loratadine Hives   Lorazepam Hives   Simvastatin Other (See  Comments)   Unknown reaction   Sulfamethoxazole Hives   Sulfonamide Derivatives Hives      Medication List    TAKE these medications   acetaminophen 500 MG tablet Commonly known as:  TYLENOL Take 500 mg by mouth every 6 (six) hours as needed for fever or headache (pain).   albuterol (2.5 MG/3ML) 0.083% nebulizer solution Commonly known as:  PROVENTIL Take 2.5 mg by nebulization every 4 (four) hours as needed for wheezing or shortness of breath.   albuterol 108 (90 Base) MCG/ACT inhaler Commonly known as:  PROVENTIL HFA;VENTOLIN HFA Inhale 2 puffs into the lungs every 6 (six) hours as  needed for wheezing or shortness of breath.   allopurinol 300 MG tablet Commonly known as:  ZYLOPRIM take 1 tablet by mouth daily for gout What changed:  See the new instructions.   ALPRAZolam 0.5 MG tablet Commonly known as:  XANAX Take 0.5 mg by mouth daily as needed for anxiety or sleep.   amoxicillin 500 MG capsule Commonly known as:  AMOXIL Take 2,000 mg by mouth See admin instructions. Take 4 capsules (2000 mg) by mouth one hour prior to dental appointment   amoxicillin-clavulanate 875-125 MG tablet Commonly known as:  AUGMENTIN Take 1 tablet by mouth every 12 (twelve) hours.   aspirin EC 81 MG tablet Take 81 mg by mouth at bedtime.   bisoprolol 10 MG tablet Commonly known as:  ZEBETA Take 1 tablet (10 mg total) by mouth daily.   citalopram 40 MG tablet Commonly known as:  CELEXA Take 1 tablet (40 mg total) by mouth daily.   cyclobenzaprine 10 MG tablet Commonly known as:  FLEXERIL Take 1 tablet (10 mg total) by mouth 3 (three) times daily as needed for muscle spasms.   diclofenac sodium 1 % Gel Commonly known as:  VOLTAREN Apply 1 application topically at bedtime as needed.   ferrous sulfate 325 (65 FE) MG tablet Take 325 mg by mouth daily.   furosemide 80 MG tablet Commonly known as:  LASIX Take 0.5-1 tablets (40-80 mg total) by mouth See admin instructions. Take 1  tablet (80 mg) by mouth every morning and 1/2 tablet (40 mg) after supper What changed:  how much to take  how to take this  when to take this  additional instructions   gabapentin 300 MG capsule Commonly known as:  NEURONTIN Take 1 capsule (300 mg total) by mouth 3 (three) times daily. What changed:  See the new instructions.   ICY HOT EX Apply 1 application topically at bedtime as needed (pain).   ipratropium 0.02 % nebulizer solution Commonly known as:  ATROVENT Take 2.5 mLs (0.5 mg total) by nebulization every 4 (four) hours as needed for wheezing or shortness of breath.   lactulose 10 GM/15ML solution Commonly known as:  CHRONULAC Take 30 g by mouth daily as needed for mild constipation (to regulate ammonia levels).   Magnesium 250 MG Tabs Take 250 mg by mouth at bedtime.   meclizine 25 MG tablet Commonly known as:  ANTIVERT Take 1 tablet (25 mg total) by mouth 2 (two) times daily.   metolazone 5 MG tablet Commonly known as:  ZAROXOLYN take daily with lasix or use as directed for fluid overload What changed:  See the new instructions.   ondansetron 4 MG tablet Commonly known as:  ZOFRAN Take 4 mg by mouth every 6 (six) hours as needed for nausea or vomiting.   OXYGEN Inhale 2 L into the lungs continuous.   pantoprazole 40 MG tablet Commonly known as:  PROTONIX Take 1 tablet (40 mg total) by mouth 2 (two) times daily as needed for heartburn What changed:  See the new instructions.   Potassium Chloride ER 20 MEQ Tbcr Take 40 mEq by mouth 2 (two) times daily. Take 3 tablets (30 meq) by mouth every morning and 2 tablets (20 meq) every evening and at bedtime What changed:  See the new instructions.   traMADol 50 MG tablet Commonly known as:  ULTRAM TAKE 50 MG BY MOUTH EVERY 8 HOURS AS NEEDED FOR PAIN What changed:  See the new instructions.   triamcinolone cream 0.1 %  Commonly known as:  KENALOG Apply 1 application topically 3 (three) times daily as  needed for itching.      Follow-up Information    MCKEOWN,WILLIAM DAVID, MD. Schedule an appointment as soon as possible for a visit in 1 week(s).   Specialty:  Internal Medicine Why:  To re-check your potassium level and make sure there is no evidence of ongoing infection. Contact information: [address] [place] [address] [phone]            Time coordinating discharge: 35 minutes.  Signed:  RAMA,CHRISTINA  Pager [phone] [facility] 10/14/2016, 9:32 AM

## 2016-10-14 NOTE — Progress Notes (Signed)
Attention: [facility]  The patient is being discharged on Augmentin rather than Levaquin. Please do not fill the Levaquin.   Jacquelynn Cree MD

## 2016-10-14 NOTE — Discharge Instructions (Signed)
We have increased your potassium dose to 40 mEq twice a day.  Your potassium level was very low when you came into the hospital.  You will take Augmentin twice a day for 5 days, start this evening. This is to treat your pneumonia.  Notify your doctor if you develop severe diarrhea.  Thank you for letting us take care of you.  Please do not hesitate to contact us if you have any problems at home.  Your nurse can page me if you have any questions after you leave.

## 2016-10-16 ENCOUNTER — Other Ambulatory Visit: Payer: Self-pay

## 2016-10-16 ENCOUNTER — Telehealth: Payer: Self-pay | Admitting: *Deleted

## 2016-10-16 MED ORDER — ONDANSETRON HCL 4 MG PO TABS: 4.0000 mg | ORAL_TABLET | Freq: Four times a day (QID) | ORAL | 1 refills | Status: DC | PRN

## 2016-10-16 NOTE — Telephone Encounter (Signed)
CALLED SCHEDULED PT FOR A HOSP FU ON THURS

## 2016-10-17 ENCOUNTER — Telehealth: Payer: Self-pay | Admitting: *Deleted

## 2016-10-17 DIAGNOSIS — J449 Chronic obstructive pulmonary disease, unspecified: Secondary | ICD-10-CM | POA: Diagnosis not present

## 2016-10-17 LAB — CULTURE, BLOOD (ROUTINE X 2)
CULTURE: NO GROWTH
Culture: NO GROWTH

## 2016-10-17 NOTE — Telephone Encounter (Signed)
Per Dr Melford Aase, it is OK for home health to restart and it is OK for a nursing evaluation.  Pete with [facility] is aware.

## 2016-10-18 ENCOUNTER — Ambulatory Visit (INDEPENDENT_AMBULATORY_CARE_PROVIDER_SITE_OTHER): Payer: PPO | Admitting: Internal Medicine

## 2016-10-18 ENCOUNTER — Encounter: Payer: Self-pay | Admitting: Internal Medicine

## 2016-10-18 VITALS — BP 130/58 | HR 66 | Temp 98.8°F | Resp 18 | Ht 62.0 in | Wt 174.0 lb

## 2016-10-18 DIAGNOSIS — Z79899 Other long term (current) drug therapy: Secondary | ICD-10-CM | POA: Diagnosis not present

## 2016-10-18 DIAGNOSIS — R188 Other ascites: Secondary | ICD-10-CM

## 2016-10-18 DIAGNOSIS — N179 Acute kidney failure, unspecified: Secondary | ICD-10-CM

## 2016-10-18 DIAGNOSIS — J189 Pneumonia, unspecified organism: Secondary | ICD-10-CM | POA: Diagnosis not present

## 2016-10-18 DIAGNOSIS — N183 Chronic kidney disease, stage 3 (moderate): Secondary | ICD-10-CM | POA: Diagnosis not present

## 2016-10-18 DIAGNOSIS — K7460 Unspecified cirrhosis of liver: Secondary | ICD-10-CM | POA: Diagnosis not present

## 2016-10-18 LAB — HEPATIC FUNCTION PANEL
ALK PHOS: 150 U/L — AB (ref 33–130)
ALT: 18 U/L (ref 6–29)
AST: 30 U/L (ref 10–35)
Albumin: 3.3 g/dL — ABNORMAL LOW (ref 3.6–5.1)
BILIRUBIN DIRECT: 0.3 mg/dL — AB (ref <=0.2)
BILIRUBIN INDIRECT: 0.7 mg/dL (ref 0.2–1.2)
Total Bilirubin: 1 mg/dL (ref 0.2–1.2)
Total Protein: 5.1 g/dL — ABNORMAL LOW (ref 6.1–8.1)

## 2016-10-18 LAB — BASIC METABOLIC PANEL WITH GFR
BUN: 11 mg/dL (ref 7–25)
CHLORIDE: 96 mmol/L — AB (ref 98–110)
CO2: 32 mmol/L — ABNORMAL HIGH (ref 20–31)
Calcium: 8.9 mg/dL (ref 8.6–10.4)
Creat: 1 mg/dL — ABNORMAL HIGH (ref 0.50–0.99)
GFR, Est African American: 66 mL/min (ref >=60)
GFR, Est Non African American: 58 mL/min — ABNORMAL LOW (ref >=60)
Glucose, Bld: 198 mg/dL — ABNORMAL HIGH (ref 65–99)
POTASSIUM: 3.1 mmol/L — AB (ref 3.5–5.3)
SODIUM: 137 mmol/L (ref 135–146)

## 2016-10-18 LAB — CBC WITH DIFFERENTIAL/PLATELET
BASOS ABS: 0 {cells}/uL (ref 0–200)
BASOS PCT: 0 %
EOS PCT: 3 %
Eosinophils Absolute: 162 cells/uL (ref 15–500)
HCT: 32.4 % — ABNORMAL LOW (ref 35.0–45.0)
Hemoglobin: 10.9 g/dL — ABNORMAL LOW (ref 11.7–15.5)
LYMPHS PCT: 16 %
Lymphs Abs: 864 cells/uL (ref 850–3900)
MCH: 33 pg (ref 27.0–33.0)
MCHC: 33.6 g/dL (ref 32.0–36.0)
MCV: 98.2 fL (ref 80.0–100.0)
MONOS PCT: 5 %
MPV: 12 fL (ref 7.5–12.5)
Monocytes Absolute: 270 cells/uL (ref 200–950)
NEUTROS ABS: 4104 {cells}/uL (ref 1500–7800)
Neutrophils Relative %: 76 %
Platelets: 80 10*3/uL — ABNORMAL LOW (ref 140–400)
RBC: 3.3 MIL/uL — ABNORMAL LOW (ref 3.80–5.10)
RDW: 15.4 % — ABNORMAL HIGH (ref 11.0–15.0)
WBC: 5.4 10*3/uL (ref 3.8–10.8)

## 2016-10-18 NOTE — Progress Notes (Signed)
Patient ID: [name], female   DOB: 1946-08-02, 71 y.o.   MRN: [id number]  Assessment and Plan:   1. HCAP (healthcare-associated pneumonia) -patient admitted for HCAP which was based off CXR.  Patient was treated in the hospital with IV vanc and zosyn and discharged on augmentin.  She has a single day left of augmentin prior to finishing course.  Fevers seem to be improving.  I did recommend using guanfenison without tylenol to help thin out mucous production -Augmentin is likely source of worsening diarrhea although she is on lactulose which certainly contributes to it.  She should see improvement with stopping augmentin  2. Medication management -Potassium was low in hospital and diuretics were held.  She has restarted diuretics will recheck this today. -BMET showed some chronic anemia and will reheck this today as some of that may have been hemodilution. - CBC with Differential/Platelet - BASIC METABOLIC PANEL WITH GFR - Hepatic function panel  3. Cirrhosis of liver with ascites, unspecified hepatic cirrhosis type ([place]) -patient has not been compliant with lactulose given diarrhea from augmentin causing more accidents.  -have discussed the lactulose is only option and if she does not take it she will not excrete the ammonia.   - AMMONIA  4. Acute renal failure superimposed on stage 3 chronic kidney disease, unspecified acute renal failure type ([place]) -improved after rehydration and temporary hold on diuretics.  Will recheck kidney functions today.      Over 40 minutes of exam, counseling, chart review, and complex, high/moderate level critical decision making was performed this visit.   HPI 71 y.o.female presents for follow up from the hospital. Admit date to the hospital was 10/12/16, patient was discharged from the hospital on 10/14/16 and our office contacted the office the day after discharge to set up a follow up appointment, patient was admitted for concerns of sepsis.  She was  found to have RLL airspace disease which was treated with IV vancomycin and zosyn with improvement.  Blood cultures were negative.  Patient did improve.  She was also found to be hypokalemic and hyperammonia.  Diuretics were held while she was  In the hopsital and potassium levels were repleted.  She was discharged from the hospital with augmentin for 5 days.  She was also restarted on her regular routine of diuretics.  Since being home form the hospital she has been doing okay.  She had been having some mild low grade temps at home at 99.4 or 99.8 and then her husband will give her some tylenol.   She does get some mild chest congestion from time to time.  She is not taking any mucinex.  She is taking tessalon as needed and is taking some meclizine.  She reports that she is taking this twice daily.  She is still seeing a home physical therapist.  He is who identified the fever in the first place.  She is frustrated that her life is so regulated by her diarrhea caused by lactulose and she is tired of dealing with it.    Images while in the hospital: Dg Chest 2 View  Result Date: 10/12/2016 CLINICAL DATA:  Fever for 1 week.  Nausea. EXAM: CHEST  2 VIEW COMPARISON:  01/19/2016 FINDINGS: There is mild right lower lobe airspace disease which may reflect atelectasis versus pneumonia. There is no pleural effusion or pneumothorax. The heart and mediastinal contours are unremarkable. The osseous structures are unremarkable. IMPRESSION: Mild right lower lobe airspace disease which may reflect atelectasis  versus pneumonia. Followup PA and lateral chest X-ray is recommended in 3-4 weeks following trial of antibiotic therapy to ensure resolution and exclude underlying malignancy. Electronically Signed   By: Kathreen Devoid   On: 10/12/2016 14:59   US Abdomen Limited  Result Date: 10/12/2016 CLINICAL DATA:  Cirrhosis with paraesophageal varices and splenomegaly. Abdominal pain. EXAM: LIMITED ABDOMEN ULTRASOUND FOR ASCITES  TECHNIQUE: Limited ultrasound survey for ascites was performed in all four abdominal quadrants. COMPARISON:  Abdominal CT 07/11/2016 FINDINGS: No varices are present. An extra renal collecting system is again seen in the right kidney. The spleen is enlarged. IMPRESSION: 1. No abdominal ascites. 2. 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 3. Splenomegaly. Electronically Signed   By: San Morelle M.D.   On: 10/12/2016 20:21   Ct Maxillofacial Wo Contrast  Result Date: 10/12/2016 CLINICAL DATA:  70 y/o F; recent root canal with complaint of lower jaw soreness and sinus pain. EXAM: CT MAXILLOFACIAL WITHOUT CONTRAST TECHNIQUE: Multidetector CT imaging of the maxillofacial structures was performed. Multiplanar CT image reconstructions were also generated. A small metallic BB was placed on the right temple in order to reliably differentiate right from left. COMPARISON:  09/03/2014 CT of the head. FINDINGS: Osseous: No fracture or mandibular dislocation. No destructive process. Orbits: No traumatic or inflammatory finding. Bilateral intra-ocular lens replacement. Sinuses: Bilateral mastoid tip opacification and sclerosis compatible sequelae of chronic otomastoiditis. Mastoid air cells are otherwise normally aerated. Borderline high-riding right jugular bulb. Paranasal sinuses are normally aerated. Soft tissues: Negative. Limited intracranial: No significant or unexpected finding. IMPRESSION: 1. No bony fracture or destructive process. Normal soft tissues. Normally aerated paranasal sinuses. 2. No finding as explanation for pain identified. Electronically Signed   By: Kristine Garbe M.D.   On: 10/12/2016 22:47    Past Medical History:  Diagnosis Date  . Asthma   . Esophageal varices ([place])   . Fibromyalgia   . Gastritis   . Hepatic cirrhosis ([place])   . Hepatic encephalopathy ([place])   . Hyperlipidemia   . Hypertension   . IBS (irritable bowel syndrome)   . Splenomegaly   . Vitamin  D deficiency      Allergies  Allergen Reactions  . Atorvastatin Other (See Comments)    Doesn't remember  . Diphenhydramine Hcl (Sleep) Hives  . Hydrocodone-Acetaminophen Other (See Comments)    Doesn't remember  . Lopid [Gemfibrozil] Other (See Comments)    Doesn't remember  . Loratadine Hives  . Lorazepam Hives  . Simvastatin Other (See Comments)    Unknown reaction  . Sulfamethoxazole Hives  . Sulfonamide Derivatives Hives      Current Outpatient Prescriptions on File Prior to Visit  Medication Sig Dispense Refill  . acetaminophen (TYLENOL) 500 MG tablet Take 500 mg by mouth every 6 (six) hours as needed for fever or headache (pain).    Marland Kitchen albuterol (PROVENTIL HFA;VENTOLIN HFA) 108 (90 Base) MCG/ACT inhaler Inhale 2 puffs into the lungs every 6 (six) hours as needed for wheezing or shortness of breath. 1 Inhaler 2  . albuterol (PROVENTIL) (2.5 MG/3ML) 0.083% nebulizer solution Take 2.5 mg by nebulization every 4 (four) hours as needed for wheezing or shortness of breath.     . allopurinol (ZYLOPRIM) 300 MG tablet take 1 tablet by mouth daily for gout 90 tablet 1  . ALPRAZolam (XANAX) 0.5 MG tablet Take 0.5 mg by mouth daily as needed for anxiety or sleep.    Marland Kitchen amoxicillin (AMOXIL) 500 MG capsule Take 2,000 mg by  mouth See admin instructions. Take 4 capsules (2000 mg) by mouth one hour prior to dental appointment    . amoxicillin-clavulanate (AUGMENTIN) 875-125 MG tablet Take 1 tablet by mouth every 12 (twelve) hours. 10 tablet 0  . aspirin EC 81 MG tablet Take 81 mg by mouth at bedtime.    . bisoprolol (ZEBETA) 10 MG tablet Take 1 tablet (10 mg total) by mouth daily. 30 tablet 11  . citalopram (CELEXA) 40 MG tablet Take 1 tablet (40 mg total) by mouth daily.    . cyclobenzaprine (FLEXERIL) 10 MG tablet Take 1 tablet (10 mg total) by mouth 3 (three) times daily as needed for muscle spasms. 90 tablet 2  . diclofenac sodium (VOLTAREN) 1 % GEL Apply 1 application topically at bedtime  as needed.    . ferrous sulfate 325 (65 FE) MG tablet Take 325 mg by mouth daily.     . furosemide (LASIX) 80 MG tablet Take 0.5-1 tablets (40-80 mg total) by mouth See admin instructions. Take 1 tablet (80 mg) by mouth every morning and 1/2 tablet (40 mg) after supper    . gabapentin (NEURONTIN) 300 MG capsule Take 1 capsule (300 mg total) by mouth 3 (three) times daily.    Marland Kitchen ipratropium (ATROVENT) 0.02 % nebulizer solution Take 2.5 mLs (0.5 mg total) by nebulization every 4 (four) hours as needed for wheezing or shortness of breath.    . lactulose (CHRONULAC) 10 GM/15ML solution Take 30 g by mouth daily as needed for mild constipation (to regulate ammonia levels).    . Magnesium 250 MG TABS Take 250 mg by mouth at bedtime.     . meclizine (ANTIVERT) 25 MG tablet Take 1 tablet (25 mg total) by mouth 2 (two) times daily.    . Menthol, Topical Analgesic, (ICY HOT EX) Apply 1 application topically at bedtime as needed (pain).    Marland Kitchen metolazone (ZAROXOLYN) 5 MG tablet take daily with lasix or use as directed for fluid overload 30 tablet 1  . ondansetron (ZOFRAN) 4 MG tablet Take 1 tablet (4 mg total) by mouth every 6 (six) hours as needed for nausea or vomiting. 30 tablet 1  . OXYGEN Inhale 2 L into the lungs continuous.     . pantoprazole (PROTONIX) 40 MG tablet Take 1 tablet (40 mg total) by mouth 2 (two) times daily as needed for heartburn 180 tablet 1  . potassium chloride 20 MEQ TBCR Take 40 mEq by mouth 2 (two) times daily. Take 3 tablets (30 meq) by mouth every morning and 2 tablets (20 meq) every evening and at bedtime    . traMADol (ULTRAM) 50 MG tablet TAKE 50 MG BY MOUTH EVERY 8 HOURS AS NEEDED FOR PAIN 90 tablet 1  . triamcinolone cream (KENALOG) 0.1 % Apply 1 application topically 3 (three) times daily as needed for itching.  0   No current facility-administered medications on file prior to visit.     Review of Systems  Constitutional: Positive for chills, fever and malaise/fatigue.   Respiratory: Positive for sputum production. Negative for cough, hemoptysis, shortness of breath and wheezing.   Cardiovascular: Negative for chest pain, palpitations and leg swelling.  Gastrointestinal: Positive for diarrhea. Negative for abdominal pain, blood in stool, constipation, heartburn, melena, nausea and vomiting.  Genitourinary: Negative.   Musculoskeletal: Negative for falls.  Skin: Negative.   Neurological: Positive for dizziness. Negative for tremors, sensory change and loss of consciousness.  Psychiatric/Behavioral: Positive for depression. The patient is not nervous/anxious and does  not have insomnia.       Physical Exam: There were no vitals filed for this visit. There were no vitals taken for this visit. General Appearance: Cronically ill appearing WF in wheel chair with pulse O2 [place] in place, appears older than stated age.   Eyes: PERRLA, EOMs, conjunctiva no swelling or erythema Sinuses: No Frontal/maxillary tenderness ENT/Mouth: Ext aud canals clear, TMs without erythema, there are mild effusions to the middle ear bilaterally. No erythema, swelling, or exudate on post pharynx.  Tonsils not swollen or erythematous. Hearing normal.  Neck: Supple, thyroid normal.  Respiratory: Respiratory effort normal, pulse [place] O2 on at 3L/min.  BS distant and mildly diminished at bilateral bases.  No appreciable rhonchi, wheeze, or rales. Cardio: RRR with no MRGs. 1+ peripheral pulses  With trace LE edema bilaterally.  Abdomen: Soft, + BS.  RUQ tenderness with mild hepatomegaly, no guarding, rebound, hernias, masses. Lymphatics: Non tender without lymphadenopathy.  Musculoskeletal: Full ROM, 5/5 strength, normal gait.  Skin: Warm, dry without rashes, lesions, ecchymosis.  Neuro: Cranial nerves intact. Normal muscle tone, no cerebellar symptoms. Sensation intact.  Psych: emotionally labile and intermittent crying, mildly depressed affect, Insight and Judgment appropriate.     Starlyn Skeans, PA-C 2:06 PM [facility]

## 2016-10-19 DIAGNOSIS — K7460 Unspecified cirrhosis of liver: Secondary | ICD-10-CM | POA: Diagnosis not present

## 2016-10-19 LAB — AMMONIA: AMMONIA: 115 umol/L — AB (ref <=47)

## 2016-10-22 ENCOUNTER — Other Ambulatory Visit: Payer: Self-pay | Admitting: Internal Medicine

## 2016-10-22 DIAGNOSIS — K7290 Hepatic failure, unspecified without coma: Secondary | ICD-10-CM

## 2016-10-22 DIAGNOSIS — K7682 Hepatic encephalopathy: Secondary | ICD-10-CM

## 2016-10-24 ENCOUNTER — Other Ambulatory Visit: Payer: Self-pay | Admitting: Internal Medicine

## 2016-10-27 DIAGNOSIS — I129 Hypertensive chronic kidney disease with stage 1 through stage 4 chronic kidney disease, or unspecified chronic kidney disease: Secondary | ICD-10-CM | POA: Diagnosis not present

## 2016-10-27 DIAGNOSIS — M6281 Muscle weakness (generalized): Secondary | ICD-10-CM | POA: Diagnosis not present

## 2016-10-27 DIAGNOSIS — J449 Chronic obstructive pulmonary disease, unspecified: Secondary | ICD-10-CM | POA: Diagnosis not present

## 2016-10-27 DIAGNOSIS — J9611 Chronic respiratory failure with hypoxia: Secondary | ICD-10-CM | POA: Diagnosis not present

## 2016-10-27 DIAGNOSIS — E1122 Type 2 diabetes mellitus with diabetic chronic kidney disease: Secondary | ICD-10-CM | POA: Diagnosis not present

## 2016-10-27 DIAGNOSIS — N183 Chronic kidney disease, stage 3 (moderate): Secondary | ICD-10-CM | POA: Diagnosis not present

## 2016-10-27 DIAGNOSIS — R261 Paralytic gait: Secondary | ICD-10-CM | POA: Diagnosis not present

## 2016-10-27 DIAGNOSIS — K7581 Nonalcoholic steatohepatitis (NASH): Secondary | ICD-10-CM | POA: Diagnosis not present

## 2016-10-27 DIAGNOSIS — M545 Low back pain: Secondary | ICD-10-CM | POA: Diagnosis not present

## 2016-10-27 DIAGNOSIS — E1165 Type 2 diabetes mellitus with hyperglycemia: Secondary | ICD-10-CM | POA: Diagnosis not present

## 2016-10-29 ENCOUNTER — Other Ambulatory Visit: Payer: Self-pay | Admitting: Internal Medicine

## 2016-10-29 DIAGNOSIS — H8110 Benign paroxysmal vertigo, unspecified ear: Secondary | ICD-10-CM

## 2016-10-30 ENCOUNTER — Other Ambulatory Visit: Payer: Self-pay | Admitting: Internal Medicine

## 2016-10-30 DIAGNOSIS — K7581 Nonalcoholic steatohepatitis (NASH): Secondary | ICD-10-CM | POA: Diagnosis not present

## 2016-10-30 DIAGNOSIS — I129 Hypertensive chronic kidney disease with stage 1 through stage 4 chronic kidney disease, or unspecified chronic kidney disease: Secondary | ICD-10-CM | POA: Diagnosis not present

## 2016-10-30 DIAGNOSIS — E1122 Type 2 diabetes mellitus with diabetic chronic kidney disease: Secondary | ICD-10-CM | POA: Diagnosis not present

## 2016-10-30 DIAGNOSIS — N183 Chronic kidney disease, stage 3 (moderate): Secondary | ICD-10-CM | POA: Diagnosis not present

## 2016-10-30 DIAGNOSIS — J449 Chronic obstructive pulmonary disease, unspecified: Secondary | ICD-10-CM | POA: Diagnosis not present

## 2016-10-30 DIAGNOSIS — M545 Low back pain: Secondary | ICD-10-CM | POA: Diagnosis not present

## 2016-10-30 DIAGNOSIS — E1165 Type 2 diabetes mellitus with hyperglycemia: Secondary | ICD-10-CM | POA: Diagnosis not present

## 2016-10-30 DIAGNOSIS — R261 Paralytic gait: Secondary | ICD-10-CM | POA: Diagnosis not present

## 2016-10-30 DIAGNOSIS — M6281 Muscle weakness (generalized): Secondary | ICD-10-CM | POA: Diagnosis not present

## 2016-10-30 DIAGNOSIS — J9611 Chronic respiratory failure with hypoxia: Secondary | ICD-10-CM | POA: Diagnosis not present

## 2016-10-31 ENCOUNTER — Other Ambulatory Visit: Payer: Self-pay | Admitting: Internal Medicine

## 2016-10-31 ENCOUNTER — Ambulatory Visit (HOSPITAL_COMMUNITY)
Admission: RE | Admit: 2016-10-31 | Discharge: 2016-10-31 | Disposition: A | Discharge status: Home / Self Care | Payer: PPO | Source: Ambulatory Visit | Attending: Internal Medicine | Admitting: Internal Medicine

## 2016-10-31 DIAGNOSIS — J9811 Atelectasis: Secondary | ICD-10-CM | POA: Insufficient documentation

## 2016-10-31 DIAGNOSIS — J189 Pneumonia, unspecified organism: Secondary | ICD-10-CM | POA: Diagnosis not present

## 2016-11-07 ENCOUNTER — Inpatient Hospital Stay (HOSPITAL_COMMUNITY)
Admission: EM | Admit: 2016-11-07 | Discharge: 2016-11-11 | DRG: 193 | Disposition: A | Discharge status: Home Health | Payer: PPO | Attending: Internal Medicine | Admitting: Internal Medicine

## 2016-11-07 ENCOUNTER — Encounter (HOSPITAL_COMMUNITY): Payer: Self-pay | Admitting: Emergency Medicine

## 2016-11-07 ENCOUNTER — Ambulatory Visit (INDEPENDENT_AMBULATORY_CARE_PROVIDER_SITE_OTHER): Payer: PPO | Admitting: Physician Assistant

## 2016-11-07 ENCOUNTER — Emergency Department (HOSPITAL_COMMUNITY): Payer: PPO

## 2016-11-07 ENCOUNTER — Encounter: Payer: Self-pay | Admitting: Physician Assistant

## 2016-11-07 VITALS — BP 116/74 | HR 78 | Temp 100.8°F | Resp 14 | Ht 62.0 in | Wt 174.0 lb

## 2016-11-07 DIAGNOSIS — I272 Pulmonary hypertension, unspecified: Secondary | ICD-10-CM | POA: Diagnosis present

## 2016-11-07 DIAGNOSIS — M797 Fibromyalgia: Secondary | ICD-10-CM

## 2016-11-07 DIAGNOSIS — D126 Benign neoplasm of colon, unspecified: Secondary | ICD-10-CM

## 2016-11-07 DIAGNOSIS — F419 Anxiety disorder, unspecified: Secondary | ICD-10-CM | POA: Diagnosis present

## 2016-11-07 DIAGNOSIS — E876 Hypokalemia: Secondary | ICD-10-CM | POA: Diagnosis not present

## 2016-11-07 DIAGNOSIS — E669 Obesity, unspecified: Secondary | ICD-10-CM

## 2016-11-07 DIAGNOSIS — J440 Chronic obstructive pulmonary disease with acute lower respiratory infection: Secondary | ICD-10-CM | POA: Diagnosis present

## 2016-11-07 DIAGNOSIS — R443 Hallucinations, unspecified: Secondary | ICD-10-CM

## 2016-11-07 DIAGNOSIS — Z87891 Personal history of nicotine dependence: Secondary | ICD-10-CM

## 2016-11-07 DIAGNOSIS — K589 Irritable bowel syndrome without diarrhea: Secondary | ICD-10-CM | POA: Diagnosis not present

## 2016-11-07 DIAGNOSIS — Z0001 Encounter for general adult medical examination with abnormal findings: Secondary | ICD-10-CM | POA: Diagnosis not present

## 2016-11-07 DIAGNOSIS — J181 Lobar pneumonia, unspecified organism: Secondary | ICD-10-CM | POA: Diagnosis not present

## 2016-11-07 DIAGNOSIS — N183 Chronic kidney disease, stage 3 unspecified: Secondary | ICD-10-CM

## 2016-11-07 DIAGNOSIS — R6889 Other general symptoms and signs: Secondary | ICD-10-CM

## 2016-11-07 DIAGNOSIS — D6959 Other secondary thrombocytopenia: Secondary | ICD-10-CM | POA: Diagnosis not present

## 2016-11-07 DIAGNOSIS — E871 Hypo-osmolality and hyponatremia: Secondary | ICD-10-CM

## 2016-11-07 DIAGNOSIS — I8500 Esophageal varices without bleeding: Secondary | ICD-10-CM | POA: Diagnosis present

## 2016-11-07 DIAGNOSIS — Z79899 Other long term (current) drug therapy: Secondary | ICD-10-CM

## 2016-11-07 DIAGNOSIS — Z0000 Encounter for general adult medical examination without abnormal findings: Secondary | ICD-10-CM

## 2016-11-07 DIAGNOSIS — I4719 Other supraventricular tachycardia: Secondary | ICD-10-CM

## 2016-11-07 DIAGNOSIS — J9611 Chronic respiratory failure with hypoxia: Secondary | ICD-10-CM

## 2016-11-07 DIAGNOSIS — J449 Chronic obstructive pulmonary disease, unspecified: Secondary | ICD-10-CM | POA: Diagnosis not present

## 2016-11-07 DIAGNOSIS — I872 Venous insufficiency (chronic) (peripheral): Secondary | ICD-10-CM | POA: Diagnosis not present

## 2016-11-07 DIAGNOSIS — K209 Esophagitis, unspecified without bleeding: Secondary | ICD-10-CM

## 2016-11-07 DIAGNOSIS — J9621 Acute and chronic respiratory failure with hypoxia: Secondary | ICD-10-CM | POA: Diagnosis present

## 2016-11-07 DIAGNOSIS — Z888 Allergy status to other drugs, medicaments and biological substances status: Secondary | ICD-10-CM

## 2016-11-07 DIAGNOSIS — K7460 Unspecified cirrhosis of liver: Secondary | ICD-10-CM | POA: Diagnosis present

## 2016-11-07 DIAGNOSIS — J441 Chronic obstructive pulmonary disease with (acute) exacerbation: Secondary | ICD-10-CM | POA: Diagnosis present

## 2016-11-07 DIAGNOSIS — R6510 Systemic inflammatory response syndrome (SIRS) of non-infectious origin without acute organ dysfunction: Secondary | ICD-10-CM | POA: Diagnosis not present

## 2016-11-07 DIAGNOSIS — D696 Thrombocytopenia, unspecified: Secondary | ICD-10-CM | POA: Diagnosis present

## 2016-11-07 DIAGNOSIS — R05 Cough: Secondary | ICD-10-CM | POA: Diagnosis not present

## 2016-11-07 DIAGNOSIS — D61818 Other pancytopenia: Secondary | ICD-10-CM | POA: Diagnosis not present

## 2016-11-07 DIAGNOSIS — I130 Hypertensive heart and chronic kidney disease with heart failure and stage 1 through stage 4 chronic kidney disease, or unspecified chronic kidney disease: Secondary | ICD-10-CM | POA: Diagnosis not present

## 2016-11-07 DIAGNOSIS — Z833 Family history of diabetes mellitus: Secondary | ICD-10-CM

## 2016-11-07 DIAGNOSIS — E785 Hyperlipidemia, unspecified: Secondary | ICD-10-CM | POA: Diagnosis present

## 2016-11-07 DIAGNOSIS — Z9981 Dependence on supplemental oxygen: Secondary | ICD-10-CM | POA: Diagnosis not present

## 2016-11-07 DIAGNOSIS — I471 Supraventricular tachycardia: Secondary | ICD-10-CM | POA: Diagnosis not present

## 2016-11-07 DIAGNOSIS — K2100 Gastro-esophageal reflux disease with esophagitis, without bleeding: Secondary | ICD-10-CM

## 2016-11-07 DIAGNOSIS — I10 Essential (primary) hypertension: Secondary | ICD-10-CM | POA: Diagnosis not present

## 2016-11-07 DIAGNOSIS — K219 Gastro-esophageal reflux disease without esophagitis: Secondary | ICD-10-CM | POA: Diagnosis not present

## 2016-11-07 DIAGNOSIS — F329 Major depressive disorder, single episode, unspecified: Secondary | ICD-10-CM | POA: Diagnosis present

## 2016-11-07 DIAGNOSIS — E559 Vitamin D deficiency, unspecified: Secondary | ICD-10-CM

## 2016-11-07 DIAGNOSIS — K7290 Hepatic failure, unspecified without coma: Secondary | ICD-10-CM | POA: Diagnosis present

## 2016-11-07 DIAGNOSIS — I8510 Secondary esophageal varices without bleeding: Secondary | ICD-10-CM

## 2016-11-07 DIAGNOSIS — R188 Other ascites: Secondary | ICD-10-CM

## 2016-11-07 DIAGNOSIS — K7682 Hepatic encephalopathy: Secondary | ICD-10-CM

## 2016-11-07 DIAGNOSIS — J101 Influenza due to other identified influenza virus with other respiratory manifestations: Secondary | ICD-10-CM | POA: Diagnosis present

## 2016-11-07 DIAGNOSIS — R9431 Abnormal electrocardiogram [ECG] [EKG]: Secondary | ICD-10-CM

## 2016-11-07 DIAGNOSIS — D649 Anemia, unspecified: Secondary | ICD-10-CM

## 2016-11-07 DIAGNOSIS — Z882 Allergy status to sulfonamides status: Secondary | ICD-10-CM

## 2016-11-07 DIAGNOSIS — Z7982 Long term (current) use of aspirin: Secondary | ICD-10-CM

## 2016-11-07 DIAGNOSIS — R0600 Dyspnea, unspecified: Secondary | ICD-10-CM

## 2016-11-07 DIAGNOSIS — J189 Pneumonia, unspecified organism: Secondary | ICD-10-CM | POA: Diagnosis not present

## 2016-11-07 DIAGNOSIS — J9612 Chronic respiratory failure with hypercapnia: Secondary | ICD-10-CM

## 2016-11-07 DIAGNOSIS — K5730 Diverticulosis of large intestine without perforation or abscess without bleeding: Secondary | ICD-10-CM | POA: Diagnosis not present

## 2016-11-07 DIAGNOSIS — N179 Acute kidney failure, unspecified: Secondary | ICD-10-CM

## 2016-11-07 DIAGNOSIS — E7220 Disorder of urea cycle metabolism, unspecified: Secondary | ICD-10-CM

## 2016-11-07 DIAGNOSIS — Y95 Nosocomial condition: Secondary | ICD-10-CM | POA: Diagnosis not present

## 2016-11-07 DIAGNOSIS — E1122 Type 2 diabetes mellitus with diabetic chronic kidney disease: Secondary | ICD-10-CM | POA: Diagnosis present

## 2016-11-07 DIAGNOSIS — J1008 Influenza due to other identified influenza virus with other specified pneumonia: Secondary | ICD-10-CM | POA: Diagnosis present

## 2016-11-07 DIAGNOSIS — R0602 Shortness of breath: Secondary | ICD-10-CM | POA: Diagnosis not present

## 2016-11-07 LAB — BASIC METABOLIC PANEL
ANION GAP: 8 (ref 5–15)
BUN: 10 mg/dL (ref 6–20)
CHLORIDE: 93 mmol/L — AB (ref 101–111)
CO2: 31 mmol/L (ref 22–32)
CREATININE: 1.26 mg/dL — AB (ref 0.44–1.00)
Calcium: 8.7 mg/dL — ABNORMAL LOW (ref 8.9–10.3)
GFR calc non Af Amer: 42 mL/min — ABNORMAL LOW (ref >60)
GFR, EST AFRICAN AMERICAN: 49 mL/min — AB (ref >60)
Glucose, Bld: 132 mg/dL — ABNORMAL HIGH (ref 65–99)
Potassium: 4.3 mmol/L (ref 3.5–5.1)
SODIUM: 132 mmol/L — AB (ref 135–145)

## 2016-11-07 LAB — I-STAT VENOUS BLOOD GAS, ED
ACID-BASE EXCESS: 10 mmol/L — AB (ref 0.0–2.0)
Bicarbonate: 34.2 mmol/L — ABNORMAL HIGH (ref 20.0–28.0)
O2 SAT: 86 %
PCO2 VEN: 44.8 mmHg (ref 44.0–60.0)
TCO2: 36 mmol/L (ref 0–100)
pH, Ven: 7.491 — ABNORMAL HIGH (ref 7.250–7.430)
pO2, Ven: 48 mmHg — ABNORMAL HIGH (ref 32.0–45.0)

## 2016-11-07 LAB — CBC
HCT: 33.9 % — ABNORMAL LOW (ref 36.0–46.0)
HEMOGLOBIN: 10.9 g/dL — AB (ref 12.0–15.0)
MCH: 33.4 pg (ref 26.0–34.0)
MCHC: 32.2 g/dL (ref 30.0–36.0)
MCV: 104 fL — AB (ref 78.0–100.0)
PLATELETS: 70 10*3/uL — AB (ref 150–400)
RBC: 3.26 MIL/uL — AB (ref 3.87–5.11)
RDW: 14.9 % (ref 11.5–15.5)
WBC: 3.6 10*3/uL — AB (ref 4.0–10.5)

## 2016-11-07 LAB — CBC WITH DIFFERENTIAL/PLATELET
Basophils Absolute: 0 10*3/uL (ref 0.0–0.1)
Basophils Relative: 0 %
EOS PCT: 3 %
Eosinophils Absolute: 0.1 10*3/uL (ref 0.0–0.7)
HEMATOCRIT: 33.5 % — AB (ref 36.0–46.0)
HEMOGLOBIN: 10.4 g/dL — AB (ref 12.0–15.0)
Lymphocytes Relative: 22 %
Lymphs Abs: 0.8 10*3/uL (ref 0.7–4.0)
MCH: 32.2 pg (ref 26.0–34.0)
MCHC: 31 g/dL (ref 30.0–36.0)
MCV: 103.7 fL — ABNORMAL HIGH (ref 78.0–100.0)
MONO ABS: 0.3 10*3/uL (ref 0.1–1.0)
MONOS PCT: 8 %
Neutro Abs: 2.3 10*3/uL (ref 1.7–7.7)
Neutrophils Relative %: 66 %
Platelets: 64 10*3/uL — ABNORMAL LOW (ref 150–400)
RBC: 3.23 MIL/uL — ABNORMAL LOW (ref 3.87–5.11)
RDW: 14.6 % (ref 11.5–15.5)
WBC: 3.5 10*3/uL — ABNORMAL LOW (ref 4.0–10.5)

## 2016-11-07 LAB — URINALYSIS, ROUTINE W REFLEX MICROSCOPIC
BILIRUBIN URINE: NEGATIVE
GLUCOSE, UA: NEGATIVE mg/dL
KETONES UR: NEGATIVE mg/dL
Leukocytes, UA: NEGATIVE
NITRITE: NEGATIVE
PROTEIN: NEGATIVE mg/dL
Specific Gravity, Urine: 1.014 (ref 1.005–1.030)
pH: 6 (ref 5.0–8.0)

## 2016-11-07 LAB — I-STAT TROPONIN, ED: TROPONIN I, POC: 0 ng/mL (ref 0.00–0.08)

## 2016-11-07 LAB — AMMONIA: Ammonia: 45 umol/L — ABNORMAL HIGH (ref 9–35)

## 2016-11-07 LAB — I-STAT CG4 LACTIC ACID, ED: LACTIC ACID, VENOUS: 1.15 mmol/L (ref 0.5–1.9)

## 2016-11-07 MED ORDER — SODIUM CHLORIDE 0.9 % IV BOLUS (SEPSIS)
500.0000 mL | Freq: Once | INTRAVENOUS | Status: AC
  Administered 2016-11-07: 500 mL via INTRAVENOUS

## 2016-11-07 MED ORDER — CEFEPIME HCL 2 G IJ SOLR
2.0000 g | Freq: Once | INTRAMUSCULAR | Status: AC
  Administered 2016-11-07: 2 g via INTRAVENOUS
  Filled 2016-11-07: qty 2

## 2016-11-07 MED ORDER — VANCOMYCIN HCL 10 G IV SOLR
1500.0000 mg | Freq: Once | INTRAVENOUS | Status: AC
  Administered 2016-11-07: 1500 mg via INTRAVENOUS
  Filled 2016-11-07 (×2): qty 1500

## 2016-11-07 MED ORDER — VANCOMYCIN HCL 10 G IV SOLR
1250.0000 mg | INTRAVENOUS | Status: DC
  Administered 2016-11-08: 1250 mg via INTRAVENOUS
  Filled 2016-11-07 (×2): qty 1250

## 2016-11-07 MED ORDER — VANCOMYCIN HCL IN DEXTROSE 1-5 GM/200ML-% IV SOLN: 1000.0000 mg | Freq: Once | INTRAVENOUS | Status: DC

## 2016-11-07 MED ORDER — DEXTROSE 5 % IV SOLN
1.0000 g | Freq: Two times a day (BID) | INTRAVENOUS | Status: DC
  Administered 2016-11-08 – 2016-11-09 (×3): 1 g via INTRAVENOUS
  Filled 2016-11-07 (×5): qty 1

## 2016-11-07 MED ORDER — ALBUTEROL SULFATE (2.5 MG/3ML) 0.083% IN NEBU
2.5000 mg | INHALATION_SOLUTION | Freq: Once | RESPIRATORY_TRACT | Status: AC
  Administered 2016-11-07: 2.5 mg via RESPIRATORY_TRACT
  Filled 2016-11-07: qty 3

## 2016-11-07 MED ORDER — METHYLPREDNISOLONE SODIUM SUCC 125 MG IJ SOLR: 125.0000 mg | Freq: Once | INTRAMUSCULAR | Status: DC

## 2016-11-07 MED ORDER — METHYLPREDNISOLONE SODIUM SUCC 125 MG IJ SOLR
125.0000 mg | Freq: Once | INTRAMUSCULAR | Status: AC
  Administered 2016-11-07: 125 mg via INTRAVENOUS
  Filled 2016-11-07: qty 2

## 2016-11-07 NOTE — Progress Notes (Addendum)
Pharmacy Antibiotic Note  [name] is a 71 y.o. female admitted on 11/07/2016 with pneumonia.  Pharmacy has been consulted for vancomycin and cefepime dosing.  Plan: Vancomycin 1500 IV*1 Vancomycin 1250 IV every 24 hours.  Goal trough 15-20 mcg/mL. Cefepime 1 gram every 12 hours  Follow renal function, culture results, LOT plans Vancomycin trough as indicated    Temp (24hrs), Avg:99.7 F (37.6 C), Min:98.6 F (37 C), Max:100.8 F (38.2 C)   Recent Labs Lab 11/07/16 1823  WBC 3.6*  CREATININE 1.26*    Estimated Creatinine Clearance: 41 mL/min (A) (by C-G formula based on SCr of 1.26 mg/dL (H)).    Allergies  Allergen Reactions  . Atorvastatin Other (See Comments)    Doesn't remember  . Diphenhydramine Hcl (Sleep) Hives  . Hydrocodone-Acetaminophen Other (See Comments)    Doesn't remember  . Lopid [Gemfibrozil] Other (See Comments)    Doesn't remember  . Loratadine Hives  . Lorazepam Hives  . Simvastatin Other (See Comments)    Unknown reaction  . Sulfamethoxazole Hives  . Sulfonamide Derivatives Hives    Antimicrobials this admission: 4/4 vanc >>  4/4 cefepime >>   Dose adjustments this admission:   Microbiology results:  Thank you for allowing pharmacy to be a part of this patient's care.  Melburn Popper 11/07/2016 8:12 PM

## 2016-11-07 NOTE — Progress Notes (Signed)
MEDICARE ANNUAL WELLNESS VISIT AND FOLLOW UP  Assessment:   SENT PATIENT AND HUSBAND TO ER FOR DYSPNEA  Dyspnea, unspecified type Has history of aspiration pneumonia, history of COPD, right now weight is stable, likely more infection related with fever, however rule out fluid overload Patient stable enough at this time to go via private vehicle with husband to [facility] ER  Hallucinations  + Asterixis, has not been consistent with lactulose  Will refer to ER for evaluation for possible encephalopathy   Essential hypertension -Well controlled - TSH -cont meds -diet and exercise -monitor at home   Mixed simple and mucopurulent chronic bronchitis ([place]) -followed by pulmonology  Type 2 diabetes mellitus with stage 3 chronic kidney disease, without long-term current use of insulin (HCC) -cont diet and exercise - Hemoglobin A1c   Other cirrhosis of liver (HCC) -monitored by GI -cont lactulose -cont xifaximin  Hyperlipidemia -cont meds -diet and exercise   Vitamin D deficiency -cont Vit D supplement  Medication management   Secondary esophageal varices without bleeding ([place]) -cont to monitor with GI   Venous insufficiency -cont lasix  Chronic respiratory failure with hypoxia and hypercapnia (HCC) -cont O2 -followed by pulmonology  COPD GOLD II with min reversiblity  -followed by pulmonology  Benign neoplasm of colon, unspecified part of colon -cont screening colonoscopy -sees GI   Esophagitis -followed by GI   Diverticulosis of large intestine without hemorrhage -without current infection or hemorrhage - increase fiber -monitor on colonoscopy   GERD -cont meds   IBS -cont meds -followed by GI   Hepatic encephalopathy (HCC) -cont lactulose and xifaximin   Fibromyalgia -stop elavil per GI   CKD stage 3 due to type 2 diabetes mellitus (HCC) -cont meds -diet and exercise as tolerated  Morbid Obesity -cont diet and exercise  At high risk for  falls -home health  Thrombocytopenia ([place]) -monitor with CBC  Hypokalemia -resolved -monitor BMET   Encounter for Medicare annual wellness exam -due next year  Atrial tachycardia, multifocal ([place])   CKD stage 3 due to type 2 diabetes mellitus ([place]) Discussed general issues about diabetes pathophysiology and management., Educational material distributed., Suggested low cholesterol diet., Encouraged aerobic exercise., Discussed foot care., Reminded to get yearly retinal exam.  Type 2 diabetes mellitus with stage 3 chronic kidney disease, without long-term current use of insulin ([place]) Discussed general issues about diabetes pathophysiology and management., Educational material distributed., Suggested low cholesterol diet., Encouraged aerobic exercise., Discussed foot care., Reminded to get yearly retinal exam.  Acute renal failure superimposed on stage 3 chronic kidney disease, unspecified acute renal failure type ([place]) Check kidney function  Fibromyalgia Continue meds  QT prolongation Need monitoring with history of electrolyte abnormalities  Morbid Obesity with co morbidities - long discussion about weight loss, diet, and exercise  Hyperammonemia (HCC) Check levels    Over 30 minutes of exam, counseling, chart review, and critical decision making was performed  Plan:   During the course of the visit the patient was educated and counseled about appropriate screening and preventive services including:    Pneumococcal vaccine   Influenza vaccine  Td vaccine  Prevnar 13  Screening electrocardiogram  Screening mammography  Bone densitometry screening  Colorectal cancer screening  Diabetes screening  Glaucoma screening  Nutrition counseling   Advanced directives: given info/requested copies  Subjective:   [name] is a 71 y.o. female who presents for Medicare Annual Wellness Visit and acute visit.  She follows with Dr. Havery Moros, NASH, she  has history  of esophageal varices and but is on bisoprolol rather than naldalol due to lung function. She has had several admissions for encephalopathy, she has been taking her lactulose and Xiphaxn regularly per her husband.  Most recently had admission for SBP, treated with rocephin.  Presents here today with FEVER, chills, cough, weight was up but husband gave her a fluid pill that helped get her weight back down, she is on 2 L of fluids a day, she is having some some confusion here in the room per husband, has not been on lactulose x 1.5 days, she has severe weakness.   She also has end stage COPD, on chronic O2 decreased to 2L, has diastolic CHF/right sided heart failure due to this, on lasix in the AM and 40 in the evening, on potassium pill 6 pills a day, last metolazone was given yesterday, has had history of low potassium in the past due to medicaitons.  Wt Readings from Last 3 Encounters:  11/07/16 174 lb (78.9 kg)  10/18/16 174 lb (78.9 kg)  10/13/16 175 lb 3.2 oz (79.5 kg)    She had cementing in Nov for compression fracture, still in a wheelchair due to breath difficulties, requires help for ADL's from husband.  [facility], HAVE DISCUSSED. Her blood pressure has been controlled at home, today their BP is BP: 116/74 She does not workout. She denies chest pain, shortness of breath, dizziness.  She is not on cholesterol medication and denies myalgias. Her cholesterol is not at goal. The cholesterol last visit was:   Lab Results  Component Value Date   CHOL 183 08/29/2016   HDL 36 (L) 08/29/2016   LDLCALC 102 (H) 08/29/2016   TRIG 226 (H) 08/29/2016   CHOLHDL 5.1 (H) 08/29/2016   She has not been working on diet and exercise for diabetes with CKD, and denies foot ulcerations, hyperglycemia, hypoglycemia , increased appetite, nausea, paresthesia of the feet, polydipsia, polyuria, visual disturbances, vomiting and weight loss. Last A1C in the office was:  Lab Results  Component  Value Date   HGBA1C 6.9 (H) 10/12/2016   Last GFR Lab Results  Component Value Date   GFRNONAA 58 (L) 10/18/2016   Patient is on Vitamin D supplement. Lab Results  Component Value Date   VD25OH 42 04/19/2016     Patient is on allopurinol for gout and does not report a recent flare.  Lab Results  Component Value Date   LABURIC 5.9 04/19/2016   She is on celexa for depression, in partial remission.  BMI is Body mass index is 31.83 kg/m., she is working on diet and exercise. Wt Readings from Last 3 Encounters:  11/07/16 174 lb (78.9 kg)  10/18/16 174 lb (78.9 kg)  10/13/16 175 lb 3.2 oz (79.5 kg)    Medication Review Current Outpatient Prescriptions on File Prior to Visit  Medication Sig Dispense Refill  . acetaminophen (TYLENOL) 500 MG tablet Take 500 mg by mouth every 6 (six) hours as needed for fever or headache (pain).    Marland Kitchen albuterol (PROVENTIL HFA;VENTOLIN HFA) 108 (90 Base) MCG/ACT inhaler Inhale 2 puffs into the lungs every 6 (six) hours as needed for wheezing or shortness of breath. 1 Inhaler 2  . albuterol (PROVENTIL) (2.5 MG/3ML) 0.083% nebulizer solution Take 2.5 mg by nebulization every 4 (four) hours as needed for wheezing or shortness of breath.     . allopurinol (ZYLOPRIM) 300 MG tablet take 1 tablet by mouth daily for gout 90 tablet 1  . allopurinol (ZYLOPRIM)  300 MG tablet take 1/2-1 tablet by mouth daily for gout 90 tablet 1  . ALPRAZolam (XANAX) 0.5 MG tablet Take 0.5 mg by mouth daily as needed for anxiety or sleep.    Marland Kitchen amoxicillin (AMOXIL) 500 MG capsule Take 2,000 mg by mouth See admin instructions. Take 4 capsules (2000 mg) by mouth one hour prior to dental appointment    . amoxicillin-clavulanate (AUGMENTIN) 875-125 MG tablet Take 1 tablet by mouth every 12 (twelve) hours. 10 tablet 0  . aspirin EC 81 MG tablet Take 81 mg by mouth at bedtime.    . bisoprolol (ZEBETA) 10 MG tablet Take 1 tablet (10 mg total) by mouth daily. 30 tablet 0  . citalopram  (CELEXA) 40 MG tablet Take 1 tablet (40 mg total) by mouth daily.    . cyclobenzaprine (FLEXERIL) 10 MG tablet Take 1 tablet (10 mg total) by mouth 3 (three) times daily as needed for muscle spasms. 90 tablet 2  . diclofenac sodium (VOLTAREN) 1 % GEL Apply 1 application topically at bedtime as needed.    . ferrous sulfate 325 (65 FE) MG tablet Take 325 mg by mouth daily.     . furosemide (LASIX) 80 MG tablet Take 0.5-1 tablets (40-80 mg total) by mouth See admin instructions. Take 1 tablet (80 mg) by mouth every morning and 1/2 tablet (40 mg) after supper    . gabapentin (NEURONTIN) 300 MG capsule Take 1 capsule (300 mg total) by mouth 3 (three) times daily.    Marland Kitchen ipratropium (ATROVENT) 0.02 % nebulizer solution Take 2.5 mLs (0.5 mg total) by nebulization every 4 (four) hours as needed for wheezing or shortness of breath.    . lactulose (CHRONULAC) 10 GM/15ML solution Take 30 g by mouth daily as needed for mild constipation (to regulate ammonia levels).    . Magnesium 250 MG TABS Take 250 mg by mouth at bedtime.     . meclizine (ANTIVERT) 25 MG tablet Take 1 tablet (25 mg total) by mouth 2 (two) times daily.    . meclizine (ANTIVERT) 25 MG tablet Take 1/2 to 1 tablet 2-3 times a day if needed for Vertigo 90 tablet 0  . Menthol, Topical Analgesic, (ICY HOT EX) Apply 1 application topically at bedtime as needed (pain).    Marland Kitchen metolazone (ZAROXOLYN) 5 MG tablet take daily with lasix or use as directed for fluid overload 30 tablet 1  . ondansetron (ZOFRAN) 4 MG tablet Take 1 tablet (4 mg total) by mouth every 6 (six) hours as needed for nausea or vomiting. 30 tablet 1  . OXYGEN Inhale 2 L into the lungs continuous.     . pantoprazole (PROTONIX) 40 MG tablet Take 1 tablet (40 mg total) by mouth 2 (two) times daily as needed for heartburn 180 tablet 1  . potassium chloride 20 MEQ TBCR Take 40 mEq by mouth 2 (two) times daily. Take 3 tablets (30 meq) by mouth every morning and 2 tablets (20 meq) every evening  and at bedtime    . traMADol (ULTRAM) 50 MG tablet take 1 TABLET BY MOUTH EVERY 8 HOURS 90 tablet 1  . triamcinolone cream (KENALOG) 0.1 % Apply 1 application topically 3 (three) times daily as needed for itching.  0   No current facility-administered medications on file prior to visit.     Current Problems (verified) Patient Active Problem List   Diagnosis Date Noted  . Obesity (BMI 30-39.9) 10/12/2016  . Hyperammonemia ([place]) 10/12/2016  . Atrial tachycardia, multifocal ([place])   .  QT prolongation 03/05/2016  . Acute renal failure superimposed on stage 3 chronic kidney disease ([place])   . Hyponatremia   . Acute on chronic respiratory failure ([place]) 01/19/2016  . DM type 2 causing CKD stage 3 ([place]) 10/30/2015  . Hepatic encephalopathy ([place]) 09/21/2015  . Hypokalemia 09/21/2015  . COPD GOLD II with min reversiblity  08/30/2015  . CKD stage 3 due to type 2 diabetes mellitus ([place]) 08/16/2015  . Chronic respiratory failure with hypoxia and hypercapnia ([place]) 08/07/2015  . Thrombocytopenia ([place]) 08/07/2015  . Venous insufficiency 07/26/2015  . Encounter for Medicare annual wellness exam 02/16/2015  . Medication management 12/14/2013  . Essential hypertension   . Hyperlipidemia   . GERD   . Vitamin D deficiency   . IBS   . Fibromyalgia   . Esophageal varices ([place]) 07/08/2012  . COLONIC POLYPS 11/11/2008  . Anemia 05/28/2008  . Hepatic cirrhosis ([place]) 05/28/2008  . Esophagitis 05/27/2008  . Diverticulosis of large intestine 05/27/2008    Screening Tests Immunization History  Administered Date(s) Administered  . Hep A / Hep B 11/05/2013, 11/12/2013, 12/07/2013, 12/31/2014  . Hepatitis B, adult 12/19/2015  . Hepatitis B, ped/adol 11/17/2015, 05/21/2016  . Influenza Split 05/26/2013, 06/17/2014  . Influenza, High Dose Seasonal PF 06/15/2015  . Influenza,inj,quad, With Preservative 05/22/2016  . Pneumococcal Conjugate-13 07/22/2015  . Pneumococcal Polysaccharide-23 08/28/2013  .  Tdap 11/19/2012    Preventative care: Last colonoscopy: 2015 Last mammogram: 2012 per epic chart review, is followed by Dr. Lindi Adie for breast cancer history  DEXA:2016  Prior vaccinations: TD or Tdap: 2014  Influenza: 2016  Pneumococcal: 2015 Prevnar13: 2016 Shingles/Zostavax: Declines, not indicated due to other chronic conditions  Names of Other Physician/Practitioners you currently use: 1. [facility]- here for primary care 2. Dr. Patrici Ranks, eye doctor, last visit several years ago, has upcoming appt 3. Dr. Johnella Moloney, dentist, last visit march/2018 Patient Care Team: Unk Pinto, MD as PCP - General (Internal Medicine) Inda Castle, MD as Consulting Physician (Gastroenterology) Izora Gala, MD as Consulting Physician (Otolaryngology)  Allergies Allergies  Allergen Reactions  . Atorvastatin Other (See Comments)    Doesn't remember  . Diphenhydramine Hcl (Sleep) Hives  . Hydrocodone-Acetaminophen Other (See Comments)    Doesn't remember  . Lopid [Gemfibrozil] Other (See Comments)    Doesn't remember  . Loratadine Hives  . Lorazepam Hives  . Simvastatin Other (See Comments)    Unknown reaction  . Sulfamethoxazole Hives  . Sulfonamide Derivatives Hives    SURGICAL HISTORY She  has a past surgical history that includes Hip Arthroplasty (Bilateral); Shoulder surgery (Right); Back surgery; Cholecystectomy; Neck surgery; Esophagogastroduodenoscopy (07/16/2012); gastric varices banding (07/16/2012); Abdominal hysterectomy; ir generic historical (06/25/2016); ir generic historical (06/27/2016); and ir generic historical (07/17/2016). FAMILY HISTORY Her family history includes Atrial fibrillation in her sister; Diabetes in her brother; Heart disease in her mother and sister. SOCIAL HISTORY She  reports that she quit smoking about 2 years ago. Her smoking use included Cigarettes. She has a 50.00 pack-year smoking history. She has never used  smokeless tobacco. She reports that she does not drink alcohol or use drugs.  MEDICARE WELLNESS OBJECTIVES: Physical activity:   Cardiac risk factors:   Depression/mood screen:   Depression screen [facility] 2/9 07/19/2016  Decreased Interest 0  Down, Depressed, Hopeless 0  PHQ - 2 Score 0  Altered sleeping -  Tired, decreased energy -  Change in appetite -  Feeling bad or failure about yourself  -  Trouble concentrating -  Moving slowly or fidgety/restless -  Suicidal thoughts -  PHQ-9 Score -  Some recent data might be hidden    ADLs:  In your present state of health, do you have any difficulty performing the following activities: 10/13/2016 07/19/2016  Hearing? N N  Vision? N N  Difficulty concentrating or making decisions? N Y  Walking or climbing stairs? N Y  Dressing or bathing? N Y  Doing errands, shopping? N Y  Conservation officer, nature and eating ? - -  Using the Toilet? - -  In the past six months, have you accidently leaked urine? - -  Do you have problems with loss of bowel control? - -  Managing your Medications? - -  Managing your Finances? - -  Housekeeping or managing your Housekeeping? - -  Some recent data might be hidden     Cognitive Testing  Alert? Yes  Normal Appearance?Yes  Oriented to person? Yes  Place? Yes   Time? Yes  Recall of three objects?  No  Can perform simple calculations? No  Displays appropriate judgment?Yes  Can read the correct time from a watch face? No  EOL planning: Does Patient Have a Medical Advance Directive?: No   Objective:   Today's Vitals   11/07/16 1623  BP: 116/74  Pulse: 78  Resp: 14  Temp: (!) 100.8 F (38.2 C)  SpO2: 97%  Weight: 174 lb (78.9 kg)  Height: 5' 2"  (1.575 m)  PainSc: 9    Body mass index is 31.83 kg/m.  General Appearance: Appears ill, no acute distress Eyes: PERRLA, EOMs, conjunctiva no swelling or erythema Sinuses: No Frontal/maxillary tenderness ENT/Mouth: Ext aud canals clear, TMs without  erythema.Tonsils not swollen or erythematous, dry mucosa Neck: Supple, thyroid normal.  Respiratory: SL O2 via [place], diffuse decreased breath sounds, rhonchi bilateral lower lobes, worse LLL, BS equal bilaterally without rales, wheezing or stridor.  Cardio: RRR with 2/6 systolic murmur. Brisk peripheral pulses with trace edema Abdomen: Soft, + BS, Distended, LUQ/epigastric tenderness, no guarding, rebound, hernias, masses. Lymphatics: + bilateral tender cervical lymphadenopathy.  Musculoskeletal: Full ROM, 3/5 strength, in wheelchair, Decreased ROM left shoulder due to pain.  Skin: Warm, dry without rashes, lesions, ecchymosis.  Neuro: Cranial nerves intact. + Asterixis Psych: Awake and oriented X 2, normal affect, Insight and Judgment appropriate.    Medicare Attestation I have personally reviewed: The patient's medical and social history Their use of alcohol, tobacco or illicit drugs Their current medications and supplements The patient's functional ability including ADLs,fall risks, home safety risks, cognitive, and hearing and visual impairment Diet and physical activities Evidence for depression or mood disorders  The patient's weight, height, BMI, and visual acuity have been recorded in the chart.  I have made referrals, counseling, and provided education to the patient based on review of the above and I have provided the patient with a written personalized care plan for preventive services.     Vicie Mutters, PA-C   11/07/2016

## 2016-11-07 NOTE — H&P (Addendum)
History and Physical    [name] ELF:[id number] DOB: [birth year] DOA: 11/07/2016  Referring MD/NP/PA:   PCP: Alesia Richards, MD   Patient coming from:  The patient is coming from home.  At baseline, pt is independent for most of ADL.   Chief Complaint: Chest pain, shortness of breath, fever and chills  HPI: [name] is a 71 y.o. female with medical history significant of COPD, asthma on 2 L oxygen at home, hypertension, hyperlipidemia, GERD, gout, depression, anxiety, splenomegaly, IBS, liver cirrhosis, hepatic encephalopathy, esophageal varices, thrombocytopenia, CKD-3, who presents with chest pain, shortness of breath, fever and chills.  Patient states that she has been having chest pain, shortness breath andcough in the past 3 days, which has been progressively getting worse. She uses 2 L oxygen at home, which needed to be titrated up due to shortness breath. Her chest pain is located in the left central chest, mild, congestion feeling, aggravated by deep breath. Denies tenderness in the calf areas. Patient also has fever and chills. She coughs up little clear mucus. Patient denies nausea, vomiting. She does not have abdominal pain at rest, but stating severe coughing induced some abdominal pain. She has loose stool which she contributes to lactulose use. Patient denies symptoms of UTI or unilateral weakness. Patient is mentally clear, no confusion.  ED Course: pt was found to have positive PCR for Flu B , WBC 8.6, lactic acid 1.15, negative urinalysis, stable renal function, temperature 100.8, tachypnea, oxygen saturation 94% on 3 L oxygen, chest x-ray showed patchy right base and lingular infiltration. Patient is admitted to stepdown as inpatient.   Review of Systems:   General: has fevers, chills, no changes in body weight, has poor appetite, has fatigue HEENT: no blurry vision, hearing changes or sore throat Respiratory: has dyspnea, coughing, wheezing CV: has chest  pain, no palpitations GI: no nausea, vomiting, abdominal pain, constipation GU: no dysuria, burning on urination, increased urinary frequency, hematuria  Ext: mild leg edema Neuro: no unilateral weakness, numbness, or tingling, no vision change or hearing loss Skin: no rash, no skin tear. MSK: No muscle spasm, no deformity, no limitation of range of movement in spin Heme: No easy bruising.  Travel history: No recent long distant travel.  Allergy:  Allergies  Allergen Reactions  . Atorvastatin Other (See Comments)    Doesn't remember  . Diphenhydramine Hcl (Sleep) Hives  . Hydrocodone-Acetaminophen Other (See Comments)    Doesn't remember  . Lopid [Gemfibrozil] Other (See Comments)    Doesn't remember  . Loratadine Hives  . Lorazepam Hives  . Simvastatin Other (See Comments)    Unknown reaction  . Sulfamethoxazole Hives  . Sulfonamide Derivatives Hives    Past Medical History:  Diagnosis Date  . Asthma   . Esophageal varices ([place])   . Fibromyalgia   . Gastritis   . Hepatic cirrhosis ([place])   . Hepatic encephalopathy ([place])   . Hyperlipidemia   . Hypertension   . IBS (irritable bowel syndrome)   . Splenomegaly   . Vitamin D deficiency     Past Surgical History:  Procedure Laterality Date  . ABDOMINAL HYSTERECTOMY    . BACK SURGERY    . CHOLECYSTECTOMY    . ESOPHAGOGASTRODUODENOSCOPY  07/16/2012   Procedure: ESOPHAGOGASTRODUODENOSCOPY (EGD);  Surgeon: Inda Castle, MD;  Location: [facility];  Service: Endoscopy;  Laterality: N/A;  . GASTRIC VARICES BANDING  07/16/2012   Procedure: GASTRIC VARICES BANDING;  Surgeon: Inda Castle, MD;  Location: [facility];  Service: Endoscopy;  Laterality: N/A;  . HIP ARTHROPLASTY Bilateral   . IR GENERIC HISTORICAL  06/25/2016   IR RADIOLOGIST EVAL & MGMT 06/25/2016 [facility]  . IR GENERIC HISTORICAL  06/27/2016   IR KYPHO LUMBAR INC FX REDUCE BONE BX UNI/BIL CANNULATION INC/IMAGING 06/27/2016 Luanne Bras, MD  [facility]  . IR GENERIC HISTORICAL  07/17/2016   IR RADIOLOGIST EVAL & MGMT 07/17/2016 [facility]  . NECK SURGERY    . SHOULDER SURGERY Right     Social History:  reports that she quit smoking about 2 years ago. Her smoking use included Cigarettes. She has a 50.00 pack-year smoking history. She has never used smokeless tobacco. She reports that she does not drink alcohol or use drugs.  Family History:  Family History  Problem Relation Age of Onset  . Diabetes Brother     deceased  . Heart disease Sister     A Fib  . Heart disease Mother     CHF  . Atrial fibrillation Sister   . Colon cancer Neg Hx      Prior to Admission medications   Medication Sig Start Date End Date Taking? Authorizing Provider  acetaminophen (TYLENOL) 500 MG tablet Take 500 mg by mouth every 6 (six) hours as needed for fever or headache (pain).   Yes Historical Provider, MD  albuterol (PROVENTIL HFA;VENTOLIN HFA) 108 (90 Base) MCG/ACT inhaler Inhale 2 puffs into the lungs every 6 (six) hours as needed for wheezing or shortness of breath. 05/27/16  Yes Kristen N Ward, DO  albuterol (PROVENTIL) (2.5 MG/3ML) 0.083% nebulizer solution Take 2.5 mg by nebulization every 4 (four) hours as needed for wheezing or shortness of breath.  04/11/16  Yes Historical Provider, MD  allopurinol (ZYLOPRIM) 300 MG tablet take 1 tablet by mouth daily for gout 10/14/16  Yes Venetia Maxon Rama, MD  ALPRAZolam Duanne Moron) 0.5 MG tablet Take 0.5 mg by mouth daily as needed for anxiety or sleep.   Yes Historical Provider, MD  amoxicillin (AMOXIL) 500 MG capsule Take 2,000 mg by mouth See admin instructions. Take 4 capsules (2000 mg) by mouth one hour prior to dental appointment   Yes Historical Provider, MD  aspirin EC 81 MG tablet Take 81 mg by mouth at bedtime.   Yes Historical Provider, MD  bisoprolol (ZEBETA) 10 MG tablet Take 1 tablet (10 mg total) by mouth daily. 10/29/16  Yes Vicie Mutters, PA-C  citalopram (CELEXA) 40 MG tablet Take 1  tablet (40 mg total) by mouth daily. 10/14/16  Yes Venetia Maxon Rama, MD  cyclobenzaprine (FLEXERIL) 10 MG tablet Take 1 tablet (10 mg total) by mouth 3 (three) times daily as needed for muscle spasms. 03/06/16  Yes Unk Pinto, MD  ferrous sulfate 325 (65 FE) MG tablet Take 325 mg by mouth daily.    Yes Historical Provider, MD  furosemide (LASIX) 80 MG tablet Take 0.5-1 tablets (40-80 mg total) by mouth See admin instructions. Take 1 tablet (80 mg) by mouth every morning and 1/2 tablet (40 mg) after supper 10/14/16  Yes Christina P Rama, MD  gabapentin (NEURONTIN) 300 MG capsule Take 1 capsule (300 mg total) by mouth 3 (three) times daily. 10/14/16  Yes Christina P Rama, MD  ipratropium (ATROVENT) 0.02 % nebulizer solution Take 2.5 mLs (0.5 mg total) by nebulization every 4 (four) hours as needed for wheezing or shortness of breath. 10/14/16  Yes Christina P Rama, MD  lactulose (CHRONULAC) 10 GM/15ML solution Take 30 g by mouth  daily as needed for mild constipation (to regulate ammonia levels).   Yes Historical Provider, MD  Magnesium 250 MG TABS Take 250 mg by mouth at bedtime.    Yes Historical Provider, MD  meclizine (ANTIVERT) 25 MG tablet Take 1 tablet (25 mg total) by mouth 2 (two) times daily. 10/14/16  Yes Venetia Maxon Rama, MD  ondansetron (ZOFRAN) 4 MG tablet Take 1 tablet (4 mg total) by mouth every 6 (six) hours as needed for nausea or vomiting. 10/16/16  Yes Vicie Mutters, PA-C  pantoprazole (PROTONIX) 40 MG tablet Take 1 tablet (40 mg total) by mouth 2 (two) times daily as needed for heartburn 10/14/16  Yes Christina P Rama, MD  potassium chloride 20 MEQ TBCR Take 40 mEq by mouth 2 (two) times daily. Take 3 tablets (30 meq) by mouth every morning and 2 tablets (20 meq) every evening and at bedtime Patient taking differently: Take 40-60 mEq by mouth See admin instructions. Take 3 tablets by mouth every morning then 2 tablets every evening and at bedtime 10/14/16  Yes Christina P Rama, MD    traMADol (ULTRAM) 50 MG tablet take 1 TABLET BY MOUTH EVERY 8 HOURS 10/24/16  Yes Vicie Mutters, PA-C  triamcinolone cream (KENALOG) 0.1 % Apply 1 application topically 3 (three) times daily as needed for itching. 03/22/16  Yes Historical Provider, MD  allopurinol (ZYLOPRIM) 300 MG tablet take 1/2-1 tablet by mouth daily for gout Patient not taking: Reported on 11/07/2016 10/30/16   Unk Pinto, MD  amoxicillin-clavulanate (AUGMENTIN) 875-125 MG tablet Take 1 tablet by mouth every 12 (twelve) hours. Patient not taking: Reported on 11/07/2016 10/14/16   Venetia Maxon Rama, MD  meclizine (ANTIVERT) 25 MG tablet Take 1/2 to 1 tablet 2-3 times a day if needed for Vertigo Patient not taking: Reported on 11/07/2016 10/29/16   Starlyn Skeans, PA-C  metolazone (ZAROXOLYN) 5 MG tablet take daily with lasix or use as directed for fluid overload Patient not taking: Reported on 11/07/2016 10/14/16   Venetia Maxon Rama, MD  OXYGEN Inhale 2 L into the lungs continuous.     Historical Provider, MD    Physical Exam: Vitals:   11/08/16 0100 11/08/16 0307 11/08/16 0400 11/08/16 0430  BP: (!) 147/67 (!) 131/56 (!) 120/58 (!) 128/51  Pulse: 83 98 91 92  Resp: 16 17 15 15   Temp:    97.8 F (36.6 C)  TempSrc:    Oral  SpO2: 95% 96%  96%  Weight:    80.9 kg (178 lb 5.6 oz)  Height:    5' 2"  (1.575 m)   General: Not in acute distress HEENT:       Eyes: PERRL, EOMI, no scleral icterus.       ENT: No discharge from the ears and nose, no pharynx injection, no tonsillar enlargement.        Neck: No JVD, no bruit, no mass felt. Heme: No neck lymph node enlargement. Cardiac: S1/S2, RRR, No murmurs, No gallops or rubs. Respiratory: Has wheezing and rhonchi bilaterally  GI: Soft, nondistended, nontender, no rebound pain, no organomegaly, BS present. GU: No hematuria Ext: Has venous insufficiency change in legs and trace leg edema bilaterally. 2+DP/PT pulse bilaterally. Musculoskeletal: No joint deformities, No joint  redness or warmth, no limitation of ROM in spin. Skin: No rashes.  Neuro: Alert, oriented X3, cranial nerves II-XII grossly intact, moves all extremities normally.  Psych: Patient is not psychotic, no suicidal or hemocidal ideation.  Labs on Admission: I have personally reviewed  following labs and imaging studies  CBC:  Recent Labs Lab 11/07/16 1823 11/07/16 2043  WBC 3.6* 3.5*  NEUTROABS  --  2.3  HGB 10.9* 10.4*  HCT 33.9* 33.5*  MCV 104.0* 103.7*  PLT 70* 64*   Basic Metabolic Panel:  Recent Labs Lab 11/07/16 1823  NA 132*  K 4.3  CL 93*  CO2 31  GLUCOSE 132*  BUN 10  CREATININE 1.26*  CALCIUM 8.7*   GFR: Estimated Creatinine Clearance: 41.5 mL/min (A) (by C-G formula based on SCr of 1.26 mg/dL (H)). Liver Function Tests: No results for input(s): AST, ALT, ALKPHOS, BILITOT, PROT, ALBUMIN in the last 168 hours. No results for input(s): LIPASE, AMYLASE in the last 168 hours.  Recent Labs Lab 11/07/16 2019  AMMONIA 45*   Coagulation Profile: No results for input(s): INR, PROTIME in the last 168 hours. Cardiac Enzymes:  Recent Labs Lab 11/08/16 0000  TROPONINI <0.03   BNP (last 3 results) No results for input(s): PROBNP in the last 8760 hours. HbA1C: No results for input(s): HGBA1C in the last 72 hours. CBG: No results for input(s): GLUCAP in the last 168 hours. Lipid Profile: No results for input(s): CHOL, HDL, LDLCALC, TRIG, CHOLHDL, LDLDIRECT in the last 72 hours. Thyroid Function Tests: No results for input(s): TSH, T4TOTAL, FREET4, T3FREE, THYROIDAB in the last 72 hours. Anemia Panel: No results for input(s): VITAMINB12, FOLATE, FERRITIN, TIBC, IRON, RETICCTPCT in the last 72 hours. Urine analysis:    Component Value Date/Time   COLORURINE YELLOW 11/07/2016 2116   APPEARANCEUR CLEAR 11/07/2016 2116   LABSPEC 1.014 11/07/2016 2116   PHURINE 6.0 11/07/2016 2116   GLUCOSEU NEGATIVE 11/07/2016 2116   HGBUR SMALL (A) 11/07/2016 2116    BILIRUBINUR NEGATIVE 11/07/2016 2116   [place] NEGATIVE 11/07/2016 2116   PROTEINUR NEGATIVE 11/07/2016 2116   UROBILINOGEN 1 01/13/2015 1145   NITRITE NEGATIVE 11/07/2016 2116   LEUKOCYTESUR NEGATIVE 11/07/2016 2116   Sepsis Labs: @LABRCNTIP (procalcitonin:4,lacticidven:4) )No results found for this or any previous visit (from the past 240 hour(s)).   Radiological Exams on Admission: Dg Chest 2 View  Result Date: 11/07/2016 CLINICAL DATA:  Shortness of breath with productive cough EXAM: CHEST  2 VIEW COMPARISON:  10/31/2016 FINDINGS: Status post right shoulder replacement. Partially visualized hardware in the cervical spine. Patchy infiltrate at the lingula and right base. No effusion. Stable cardiomediastinal silhouette with atherosclerosis. No pneumothorax. Surgical clips in the right upper quadrant. Post augmentation changes of L1. IMPRESSION: Patchy infiltrates within the lingula and right lung base. Electronically Signed   By: Donavan Foil M.D.   On: 11/07/2016 19:23     EKG: Independently reviewed.  Sinus rhythm, QTC 494, incomplete right bundle blockage which is old.   Assessment/Plan Principal Problem:   Acute on chronic respiratory failure with hypoxia (HCC) Active Problems:   Hepatic cirrhosis (HCC)   Esophageal varices (HCC)   Essential hypertension   GERD   Thrombocytopenia (HCC)   CKD stage 3 due to type 2 diabetes mellitus (HCC)   COPD with acute exacerbation ([place])   HCAP (healthcare-associated pneumonia)   Influenza B   Acute on chronic respiratory failure with hypoxia due to COPD exacerbation, Flu B and possible HCAP: Patient is positive for flu B. Chest x-ray showed patchy infiltration in right base and lingula indicating possible HCAP. Lactic acid is normal. Clinically nonseptic. Hemodynamically stable.  - Will admit to SDU as inpt - IV Vancomycin and cefepime - start tamilfu  - Mucinex for cough  - prn  Albuterol Nebs, DuoNeb, for SOB - Solu-Medrol 60 mg  3 times a day - Urine legionella and S. pneumococcal antigen - Follow up blood culture x2, sputum culture  Mild Chest pain: Likely due to pneumonia and coughing. Patient has pleuritic chest pain, aggravated by deep breath. -Troponin 3 -continue ASA, zebeta  Hepatic cirrhosis: Mental status normal. Ammonia slightly elevated 45, but no signs of HEPATIC encephalopathy. -Continue lactulose and Zebeta   Chronic diastolic congestive heart failure: 2-D echo 08/08/15 showed EF 55% with grade 1 diastolic dysfunction. Patient does not have leg edema. CHF seems to be compensated. -hold lasix since pt is at risk of developing sepsis. -Continue aspirin and Zebeta -check BNP  HTN: -continue Zebeta -IV hydralazine when necessary  GERD: -Protonix  Thrombocytopenia: Likely due to liver cirrhosis. Platelet 70. No bleeding tendency -Follow-up by CBC  CKD-III: stable. Baseline creatinine 1.0-1.2. Her creatinine is 1.26 on admission. -Follow-up by BMP   DVT ppx: SCD Code Status: Partial Code (I discussed that with patient about her CODE STATUS. I explained meaning of CODE STATUS to patient clearly, she wants to be partial code, OK for CPR, but no intubation). Family Communication: Yes, patient's husband at bed side Disposition Plan:  Anticipate discharge back to previous home environment Consults called:  none Admission status: SDU/inpation       Date of Service 11/08/2016    Ivor Costa [facility] Pager [phone]  If 7PM-7AM, please contact night-coverage [email or web address] Password [email or web address] 11/08/2016, 5:29 AM

## 2016-11-07 NOTE — ED Triage Notes (Signed)
Diagnosed with pneumonia 4 weeks ago, states left sided chest pain that began a few days ago. Pt on 2L O2 at home. Pt also reports worsening sob.

## 2016-11-07 NOTE — ED Provider Notes (Signed)
[facility] DEPT Provider Note   CSN: [id number] Arrival date & time: 11/07/16  1807     History   Chief Complaint Chief Complaint  Patient presents with  . Chest Pain    HPI [name] is a 71 y.o. female.  HPI  This is a 71 year old female with history of COPD, liver failure, pulmonary hypertension who is on home oxygen with recent admission for healthcare associated pneumonia discharged home on Augmentin 10/14/2016. She states that she initially got better at home. If this Monday she began having some nasal congestion, sneezing, and sore throat. That has progressed to cough and dyspnea. She has had some subjective fever at home. She has been eating without vomiting but describes some loose stools. She reports having a flu shot this year and does not think that she has had the flu. Her husband did have some similar symptoms prior to her getting sick. After his office. Her primary care doctor is Dr. Vicente Serene. She reports that she had had some increased oxygen requirement that she was decreased back to due to CO2 retention. She denies abdominal pain, urinary tract infection symptoms, or rash. She endorses that she has had some chest pain with coughing and has some left lower leg swelling that is at baseline with some increased erythema of the left lower extremity which is also at baseline. She lives at home with her husband who is present with her at bedside.  Past Medical History:  Diagnosis Date  . Asthma   . Esophageal varices ([place])   . Fibromyalgia   . Gastritis   . Hepatic cirrhosis ([place])   . Hepatic encephalopathy ([place])   . Hyperlipidemia   . Hypertension   . IBS (irritable bowel syndrome)   . Splenomegaly   . Vitamin D deficiency     Patient Active Problem List   Diagnosis Date Noted  . Obesity (BMI 30-39.9) 10/12/2016  . Hyperammonemia ([place]) 10/12/2016  . Atrial tachycardia, multifocal ([place])   . QT prolongation 03/05/2016  . Acute renal failure superimposed on  stage 3 chronic kidney disease ([place])   . Hyponatremia   . Acute on chronic respiratory failure ([place]) 01/19/2016  . DM type 2 causing CKD stage 3 ([place]) 10/30/2015  . Hepatic encephalopathy ([place]) 09/21/2015  . Hypokalemia 09/21/2015  . COPD GOLD II with min reversiblity  08/30/2015  . CKD stage 3 due to type 2 diabetes mellitus ([place]) 08/16/2015  . Chronic respiratory failure with hypoxia and hypercapnia ([place]) 08/07/2015  . Thrombocytopenia ([place]) 08/07/2015  . Venous insufficiency 07/26/2015  . Encounter for Medicare annual wellness exam 02/16/2015  . Medication management 12/14/2013  . Essential hypertension   . Hyperlipidemia   . GERD   . Vitamin D deficiency   . IBS   . Fibromyalgia   . Esophageal varices ([place]) 07/08/2012  . COLONIC POLYPS 11/11/2008  . Anemia 05/28/2008  . Hepatic cirrhosis ([place]) 05/28/2008  . Esophagitis 05/27/2008  . Diverticulosis of large intestine 05/27/2008    Past Surgical History:  Procedure Laterality Date  . ABDOMINAL HYSTERECTOMY    . BACK SURGERY    . CHOLECYSTECTOMY    . ESOPHAGOGASTRODUODENOSCOPY  07/16/2012   Procedure: ESOPHAGOGASTRODUODENOSCOPY (EGD);  Surgeon: Inda Castle, MD;  Location: [facility];  Service: Endoscopy;  Laterality: N/A;  . GASTRIC VARICES BANDING  07/16/2012   Procedure: GASTRIC VARICES BANDING;  Surgeon: Inda Castle, MD;  Location: [facility];  Service: Endoscopy;  Laterality: N/A;  . HIP ARTHROPLASTY Bilateral   .  IR GENERIC HISTORICAL  06/25/2016   IR RADIOLOGIST EVAL & MGMT 06/25/2016 [facility]  . IR GENERIC HISTORICAL  06/27/2016   IR KYPHO LUMBAR INC FX REDUCE BONE BX UNI/BIL CANNULATION INC/IMAGING 06/27/2016 Luanne Bras, MD [facility]  . IR GENERIC HISTORICAL  07/17/2016   IR RADIOLOGIST EVAL & MGMT 07/17/2016 [facility]  . NECK SURGERY    . SHOULDER SURGERY Right     OB History    No data available       Home Medications    Prior to Admission medications   Medication Sig  Start Date End Date Taking? Authorizing Provider  acetaminophen (TYLENOL) 500 MG tablet Take 500 mg by mouth every 6 (six) hours as needed for fever or headache (pain).    Historical Provider, MD  albuterol (PROVENTIL HFA;VENTOLIN HFA) 108 (90 Base) MCG/ACT inhaler Inhale 2 puffs into the lungs every 6 (six) hours as needed for wheezing or shortness of breath. 05/27/16   Kristen N Ward, DO  albuterol (PROVENTIL) (2.5 MG/3ML) 0.083% nebulizer solution Take 2.5 mg by nebulization every 4 (four) hours as needed for wheezing or shortness of breath.  04/11/16   Historical Provider, MD  allopurinol (ZYLOPRIM) 300 MG tablet take 1 tablet by mouth daily for gout 10/14/16   Venetia Maxon Rama, MD  allopurinol (ZYLOPRIM) 300 MG tablet take 1/2-1 tablet by mouth daily for gout 10/30/16   Unk Pinto, MD  ALPRAZolam Duanne Moron) 0.5 MG tablet Take 0.5 mg by mouth daily as needed for anxiety or sleep.    Historical Provider, MD  amoxicillin (AMOXIL) 500 MG capsule Take 2,000 mg by mouth See admin instructions. Take 4 capsules (2000 mg) by mouth one hour prior to dental appointment    Historical Provider, MD  amoxicillin-clavulanate (AUGMENTIN) 875-125 MG tablet Take 1 tablet by mouth every 12 (twelve) hours. 10/14/16   Venetia Maxon Rama, MD  aspirin EC 81 MG tablet Take 81 mg by mouth at bedtime.    Historical Provider, MD  bisoprolol (ZEBETA) 10 MG tablet Take 1 tablet (10 mg total) by mouth daily. 10/29/16   Vicie Mutters, PA-C  citalopram (CELEXA) 40 MG tablet Take 1 tablet (40 mg total) by mouth daily. 10/14/16   Venetia Maxon Rama, MD  cyclobenzaprine (FLEXERIL) 10 MG tablet Take 1 tablet (10 mg total) by mouth 3 (three) times daily as needed for muscle spasms. 03/06/16   Unk Pinto, MD  diclofenac sodium (VOLTAREN) 1 % GEL Apply 1 application topically at bedtime as needed. 08/13/16   Historical Provider, MD  ferrous sulfate 325 (65 FE) MG tablet Take 325 mg by mouth daily.     Historical Provider, MD  furosemide  (LASIX) 80 MG tablet Take 0.5-1 tablets (40-80 mg total) by mouth See admin instructions. Take 1 tablet (80 mg) by mouth every morning and 1/2 tablet (40 mg) after supper 10/14/16   Venetia Maxon Rama, MD  gabapentin (NEURONTIN) 300 MG capsule Take 1 capsule (300 mg total) by mouth 3 (three) times daily. 10/14/16   Christina P Rama, MD  ipratropium (ATROVENT) 0.02 % nebulizer solution Take 2.5 mLs (0.5 mg total) by nebulization every 4 (four) hours as needed for wheezing or shortness of breath. 10/14/16   Venetia Maxon Rama, MD  lactulose (CHRONULAC) 10 GM/15ML solution Take 30 g by mouth daily as needed for mild constipation (to regulate ammonia levels).    Historical Provider, MD  Magnesium 250 MG TABS Take 250 mg by mouth at bedtime.     Historical  Provider, MD  meclizine (ANTIVERT) 25 MG tablet Take 1 tablet (25 mg total) by mouth 2 (two) times daily. 10/14/16   Venetia Maxon Rama, MD  meclizine (ANTIVERT) 25 MG tablet Take 1/2 to 1 tablet 2-3 times a day if needed for Vertigo 10/29/16   Loma Sousa Forcucci, PA-C  Menthol, Topical Analgesic, (ICY HOT EX) Apply 1 application topically at bedtime as needed (pain).    Historical Provider, MD  metolazone (ZAROXOLYN) 5 MG tablet take daily with lasix or use as directed for fluid overload 10/14/16   Venetia Maxon Rama, MD  ondansetron (ZOFRAN) 4 MG tablet Take 1 tablet (4 mg total) by mouth every 6 (six) hours as needed for nausea or vomiting. 10/16/16   Vicie Mutters, PA-C  OXYGEN Inhale 2 L into the lungs continuous.     Historical Provider, MD  pantoprazole (PROTONIX) 40 MG tablet Take 1 tablet (40 mg total) by mouth 2 (two) times daily as needed for heartburn 10/14/16   Venetia Maxon Rama, MD  potassium chloride 20 MEQ TBCR Take 40 mEq by mouth 2 (two) times daily. Take 3 tablets (30 meq) by mouth every morning and 2 tablets (20 meq) every evening and at bedtime 10/14/16   Venetia Maxon Rama, MD  traMADol (ULTRAM) 50 MG tablet take 1 TABLET BY MOUTH EVERY 8 HOURS 10/24/16    Vicie Mutters, PA-C  triamcinolone cream (KENALOG) 0.1 % Apply 1 application topically 3 (three) times daily as needed for itching. 03/22/16   Historical Provider, MD    Family History Family History  Problem Relation Age of Onset  . Diabetes Brother     deceased  . Heart disease Sister     A Fib  . Heart disease Mother     CHF  . Atrial fibrillation Sister   . Colon cancer Neg Hx     Social History Social History  Substance Use Topics  . Smoking status: Former Smoker    Packs/day: 1.00    Years: 50.00    Types: Cigarettes    Quit date: 11/05/2014  . Smokeless tobacco: Never Used     Comment: Quit April 2016  . Alcohol use No     Allergies   Atorvastatin; Diphenhydramine hcl (sleep); Hydrocodone-acetaminophen; Lopid [gemfibrozil]; Loratadine; Lorazepam; Simvastatin; Sulfamethoxazole; and Sulfonamide derivatives   Review of Systems Review of Systems  All other systems reviewed and are negative.    Physical Exam Updated Vital Signs BP (!) 141/55 (BP Location: Left Arm)   Pulse 73   Temp 98.6 F (37 C)   Resp 18   SpO2 99%   Physical Exam  Constitutional: She is oriented to person, place, and time. She appears well-developed and well-nourished. She appears distressed.  HENT:  Head: Normocephalic and atraumatic.  Right Ear: External ear normal.  Left Ear: External ear normal.  Eyes: EOM are normal. Pupils are equal, round, and reactive to light.  Neck: Normal range of motion.  Cardiovascular: Normal rate.   Pulmonary/Chest:  Increased work of breathing Diffuse rhonchi Expiratory wheezes but patient also has some stridorous sounds making it difficult to ascertain  Abdominal: Soft. Bowel sounds are normal.  Musculoskeletal: She exhibits edema.  Bilateral lower extremity edema with some weakness of both legs with some erythema consistent with chronic vascular insufficiency of left lower leg  Neurological: She is alert and oriented to person, place, and time.  She displays normal reflexes. No cranial nerve deficit or sensory deficit. She exhibits normal muscle tone. Coordination normal.  Skin: Skin  is warm and dry. Capillary refill takes less than 2 seconds.  Psychiatric: She has a normal mood and affect.  Nursing note and vitals reviewed.  Initial temperature on presentation 100.8 orally 97% history rate 14-21 blood pressure systolic 101-751 and diastolically 02-58  ED Treatments / Results  Labs (all labs ordered are listed, but only abnormal results are displayed) Labs Reviewed  BASIC METABOLIC PANEL - Abnormal; Notable for the following:       Result Value   Sodium 132 (*)    Chloride 93 (*)    Glucose, Bld 132 (*)    Creatinine, Ser 1.26 (*)    Calcium 8.7 (*)    GFR calc non Af Amer 42 (*)    GFR calc Af Amer 49 (*)    All other components within normal limits  CBC - Abnormal; Notable for the following:    WBC 3.6 (*)    RBC 3.26 (*)    Hemoglobin 10.9 (*)    HCT 33.9 (*)    MCV 104.0 (*)    Platelets 70 (*)    All other components within normal limits  I-STAT TROPOININ, ED    EKG  EKG Interpretation  Date/Time:  Wednesday November 07 2016 18:23:10 EDT Ventricular Rate:  66 PR Interval:  148 QRS Duration: 114 QT Interval:  472 QTC Calculation: 494 R Axis:   80 Text Interpretation:  Normal sinus rhythm Incomplete right bundle branch block Prolonged QT Abnormal ECG Confirmed by Melissaann Dizdarevic MD, Andee Poles [phone]) on 11/07/2016 7:37:19 PM       Radiology Dg Chest 2 View  Result Date: 11/07/2016 CLINICAL DATA:  Shortness of breath with productive cough EXAM: CHEST  2 VIEW COMPARISON:  10/31/2016 FINDINGS: Status post right shoulder replacement. Partially visualized hardware in the cervical spine. Patchy infiltrate at the lingula and right base. No effusion. Stable cardiomediastinal silhouette with atherosclerosis. No pneumothorax. Surgical clips in the right upper quadrant. Post augmentation changes of L1. IMPRESSION: Patchy infiltrates  within the lingula and right lung base. Electronically Signed   By: Donavan Foil M.D.   On: 11/07/2016 19:23    Procedures Procedures (including critical care time)  Medications Ordered in ED Medications - No data to display   Initial Impression / Assessment and Plan / ED Course  I have reviewed the triage vital signs and the nursing notes.  Pertinent labs & imaging results that were available during my care of the patient were reviewed by me and considered in my medical decision making (see chart for details).     This is a 71 year old female with COPD and hepatic cirrhosis who presents today with right lingual or right lower lobe infiltrate consistent with pneumonia. She is also wheezing and has exacerbation of her COPD. She was recently admitted for pneumonia is being treated here for healthcare associated pneumonia. Heart rate and blood pressure are stable here.  VBG   Ph 7.49/pco2 82 Patietn with history of hepatic encephalopathy. Here she is awake and alert and oriented. Her ammonia level today is 45 Discussed patient's care with Dr.Niu and he will see and admit. Final Clinical Impressions(s) / ED Diagnoses   Final diagnoses:  HCAP (healthcare-associated pneumonia)  COPD exacerbation ([place])    New Prescriptions New Prescriptions   No medications on file     Pattricia Boss, MD 11/07/16 2308

## 2016-11-08 DIAGNOSIS — J189 Pneumonia, unspecified organism: Secondary | ICD-10-CM

## 2016-11-08 DIAGNOSIS — R6510 Systemic inflammatory response syndrome (SIRS) of non-infectious origin without acute organ dysfunction: Secondary | ICD-10-CM

## 2016-11-08 DIAGNOSIS — J441 Chronic obstructive pulmonary disease with (acute) exacerbation: Secondary | ICD-10-CM

## 2016-11-08 DIAGNOSIS — J101 Influenza due to other identified influenza virus with other respiratory manifestations: Secondary | ICD-10-CM

## 2016-11-08 DIAGNOSIS — J9621 Acute and chronic respiratory failure with hypoxia: Secondary | ICD-10-CM

## 2016-11-08 LAB — BRAIN NATRIURETIC PEPTIDE: B Natriuretic Peptide: 118.7 pg/mL — ABNORMAL HIGH (ref 0.0–100.0)

## 2016-11-08 LAB — TROPONIN I: Troponin I: <0.03 ng/mL (ref <0.03)

## 2016-11-08 LAB — STREP PNEUMONIAE URINARY ANTIGEN: STREP PNEUMO URINARY ANTIGEN: NEGATIVE

## 2016-11-08 LAB — I-STAT CG4 LACTIC ACID, ED: Lactic Acid, Venous: 1.05 mmol/L (ref 0.5–1.9)

## 2016-11-08 LAB — INFLUENZA PANEL BY PCR (TYPE A & B)
INFLBPCR: POSITIVE — AB
Influenza A By PCR: NEGATIVE

## 2016-11-08 LAB — MRSA PCR SCREENING: MRSA BY PCR: NEGATIVE

## 2016-11-08 LAB — HIV ANTIBODY (ROUTINE TESTING W REFLEX): HIV Screen 4th Generation wRfx: NONREACTIVE

## 2016-11-08 MED ORDER — MAGNESIUM OXIDE 400 (241.3 MG) MG PO TABS
200.0000 mg | ORAL_TABLET | Freq: Every day | ORAL | Status: DC
  Administered 2016-11-08 – 2016-11-10 (×3): 200 mg via ORAL
  Filled 2016-11-08 (×3): qty 1

## 2016-11-08 MED ORDER — IPRATROPIUM-ALBUTEROL 0.5-2.5 (3) MG/3ML IN SOLN
3.0000 mL | RESPIRATORY_TRACT | Status: DC
  Administered 2016-11-08 (×5): 3 mL via RESPIRATORY_TRACT
  Filled 2016-11-08 (×5): qty 3

## 2016-11-08 MED ORDER — LACTULOSE 10 GM/15ML PO SOLN
30.0000 g | Freq: Every day | ORAL | Status: DC
  Administered 2016-11-08 – 2016-11-11 (×4): 30 g via ORAL
  Filled 2016-11-08 (×5): qty 45

## 2016-11-08 MED ORDER — ACETAMINOPHEN 500 MG PO TABS: 500.0000 mg | ORAL_TABLET | Freq: Four times a day (QID) | ORAL | Status: DC | PRN

## 2016-11-08 MED ORDER — TRAMADOL HCL 50 MG PO TABS
50.0000 mg | ORAL_TABLET | Freq: Three times a day (TID) | ORAL | Status: DC
  Administered 2016-11-08 – 2016-11-11 (×11): 50 mg via ORAL
  Filled 2016-11-08 (×11): qty 1

## 2016-11-08 MED ORDER — FERROUS SULFATE 325 (65 FE) MG PO TABS
325.0000 mg | ORAL_TABLET | Freq: Every day | ORAL | Status: DC
  Administered 2016-11-08: 325 mg via ORAL
  Filled 2016-11-08 (×3): qty 1

## 2016-11-08 MED ORDER — BISOPROLOL FUMARATE 5 MG PO TABS
10.0000 mg | ORAL_TABLET | Freq: Every day | ORAL | Status: DC
  Administered 2016-11-08 – 2016-11-11 (×4): 10 mg via ORAL
  Filled 2016-11-08: qty 1
  Filled 2016-11-08 (×4): qty 2

## 2016-11-08 MED ORDER — METHYLPREDNISOLONE SODIUM SUCC 125 MG IJ SOLR
60.0000 mg | Freq: Three times a day (TID) | INTRAMUSCULAR | Status: DC
  Administered 2016-11-08 – 2016-11-10 (×7): 60 mg via INTRAVENOUS
  Filled 2016-11-08 (×7): qty 2

## 2016-11-08 MED ORDER — OSELTAMIVIR PHOSPHATE 75 MG PO CAPS: 75.0000 mg | ORAL_CAPSULE | Freq: Two times a day (BID) | ORAL | Status: DC

## 2016-11-08 MED ORDER — ASPIRIN EC 81 MG PO TBEC
81.0000 mg | DELAYED_RELEASE_TABLET | Freq: Every day | ORAL | Status: DC
  Administered 2016-11-08 – 2016-11-10 (×4): 81 mg via ORAL
  Filled 2016-11-08 (×4): qty 1

## 2016-11-08 MED ORDER — OSELTAMIVIR PHOSPHATE 30 MG PO CAPS
30.0000 mg | ORAL_CAPSULE | Freq: Two times a day (BID) | ORAL | Status: DC
Start: 2016-11-08 — End: 2016-11-11
  Administered 2016-11-08 – 2016-11-11 (×8): 30 mg via ORAL
  Filled 2016-11-08 (×8): qty 1

## 2016-11-08 MED ORDER — MAGNESIUM 250 MG PO TABS: 250.0000 mg | ORAL_TABLET | Freq: Every day | ORAL | Status: DC

## 2016-11-08 MED ORDER — ALLOPURINOL 300 MG PO TABS
300.0000 mg | ORAL_TABLET | Freq: Every day | ORAL | Status: DC
  Administered 2016-11-08 – 2016-11-11 (×4): 300 mg via ORAL
  Filled 2016-11-08 (×7): qty 1

## 2016-11-08 MED ORDER — IPRATROPIUM-ALBUTEROL 0.5-2.5 (3) MG/3ML IN SOLN
3.0000 mL | Freq: Four times a day (QID) | RESPIRATORY_TRACT | Status: DC
  Administered 2016-11-09: 3 mL via RESPIRATORY_TRACT
  Filled 2016-11-08 (×3): qty 3

## 2016-11-08 MED ORDER — CITALOPRAM HYDROBROMIDE 20 MG PO TABS
40.0000 mg | ORAL_TABLET | Freq: Every day | ORAL | Status: DC
  Administered 2016-11-08 – 2016-11-11 (×4): 40 mg via ORAL
  Filled 2016-11-08 (×5): qty 2

## 2016-11-08 MED ORDER — ALPRAZOLAM 0.5 MG PO TABS
0.5000 mg | ORAL_TABLET | Freq: Every day | ORAL | Status: DC | PRN
  Administered 2016-11-08: 0.5 mg via ORAL
  Filled 2016-11-08: qty 1

## 2016-11-08 MED ORDER — HYDRALAZINE HCL 20 MG/ML IJ SOLN: 5.0000 mg | INTRAMUSCULAR | Status: DC | PRN

## 2016-11-08 MED ORDER — CYCLOBENZAPRINE HCL 10 MG PO TABS: 10.0000 mg | ORAL_TABLET | Freq: Three times a day (TID) | ORAL | Status: DC | PRN

## 2016-11-08 MED ORDER — DM-GUAIFENESIN ER 30-600 MG PO TB12
1.0000 | ORAL_TABLET | Freq: Two times a day (BID) | ORAL | Status: DC
  Administered 2016-11-08 – 2016-11-11 (×8): 1 via ORAL
  Filled 2016-11-08 (×8): qty 1

## 2016-11-08 MED ORDER — PANTOPRAZOLE SODIUM 40 MG PO TBEC
40.0000 mg | DELAYED_RELEASE_TABLET | Freq: Every day | ORAL | Status: DC
  Administered 2016-11-08 – 2016-11-11 (×4): 40 mg via ORAL
  Filled 2016-11-08 (×4): qty 1

## 2016-11-08 MED ORDER — TRIAMCINOLONE ACETONIDE 0.1 % EX CREA
TOPICAL_CREAM | Freq: Two times a day (BID) | CUTANEOUS | Status: DC
  Administered 2016-11-08 – 2016-11-10 (×5): via TOPICAL
  Filled 2016-11-08: qty 15

## 2016-11-08 MED ORDER — GABAPENTIN 300 MG PO CAPS
300.0000 mg | ORAL_CAPSULE | Freq: Three times a day (TID) | ORAL | Status: DC
  Administered 2016-11-08 – 2016-11-11 (×10): 300 mg via ORAL
  Filled 2016-11-08 (×10): qty 1

## 2016-11-08 MED ORDER — ALBUTEROL SULFATE (2.5 MG/3ML) 0.083% IN NEBU
2.5000 mg | INHALATION_SOLUTION | RESPIRATORY_TRACT | Status: DC | PRN
  Administered 2016-11-10 – 2016-11-11 (×2): 2.5 mg via RESPIRATORY_TRACT
  Filled 2016-11-08 (×2): qty 3

## 2016-11-08 MED ORDER — MECLIZINE HCL 25 MG PO TABS
25.0000 mg | ORAL_TABLET | Freq: Two times a day (BID) | ORAL | Status: DC
Start: 2016-11-08 — End: 2016-11-11
  Administered 2016-11-08 – 2016-11-11 (×8): 25 mg via ORAL
  Filled 2016-11-08 (×9): qty 1

## 2016-11-08 NOTE — Care Management Note (Signed)
Case Management Note  Patient Details  Name: [name] MRN: [id number] Date of Birth: June 07, 1946  Subjective/Objective:  From home with spouse, has home oxygen, presents with resp failure, copd ex, flu B, poss hcap, NCM will cont to follow for dc needs.                   Action/Plan:   Expected Discharge Date:                  Expected Discharge Plan:  Home/Self Care  In-House Referral:     Discharge planning Services  CM Consult  Post Acute Care Choice:    Choice offered to:     DME Arranged:    DME Agency:     HH Arranged:    HH Agency:     Status of Service:  In process, will continue to follow  If discussed at Long Length of Stay Meetings, dates discussed:    Additional Comments:  Zenon Mayo, RN 11/08/2016, 4:54 PM

## 2016-11-08 NOTE — Progress Notes (Signed)
Pt transferred to 3E09 stable and patient spouse made aware

## 2016-11-08 NOTE — Progress Notes (Signed)
Patient received as transfer from [facility] to unit.  Patient with VSS, no distress.  Patient up to weigh and up to [facility] with one standby assist.  Heart monitor initiated, patient on 2 liter via nasal cannula as she is at home.

## 2016-11-08 NOTE — ED Notes (Signed)
Pt c/o chest pain upon palpation and coughing. And states that when she got up to the bedside commode and came back to bed she was noted to be sweating. Temperature taken with result of 98.65F. All of this was relayed to Dr. [place] Hamper, who states pt will now be going to step down instead of telemetry

## 2016-11-08 NOTE — Progress Notes (Signed)
PHARMACY NOTE:  ANTIMICROBIAL RENAL DOSAGE ADJUSTMENT  Current antimicrobial regimen includes a mismatch between antimicrobial dosage and estimated renal function.  As per policy approved by the Pharmacy & Therapeutics and Medical Executive Committees, the antimicrobial dosage will be adjusted accordingly.  Current antimicrobial dosage:  Tamiflu 64m po BID  Indication: Flu  Renal Function:  Estimated Creatinine Clearance: 41 mL/min (A) (by C-G formula based on SCr of 1.26 mg/dL (H)).    Antimicrobial dosage has been changed to:  Tamiflu 351mpo BID  Thank you for allowing pharmacy to be a part of this patient's care.  CaSherlon HandingPharmD, BCPS Clinical pharmacist, pager [phone]/12/2016 12:59 AM

## 2016-11-08 NOTE — Progress Notes (Signed)
PROGRESS NOTE                                                                                                                                                                                                             Patient Demographics:    [name], is a 71 y.o. female, DOB - October 08, 1945, [id number]  Admit date - 11/07/2016   Admitting Physician Ivor Costa, MD  Outpatient Primary MD for the patient is Alesia Richards, MD  LOS - 1  Outpatient Specialists: NONE  Chief Complaint  Patient presents with  . Chest Pain       Brief Narrative  71 year old female with history of COPD and asthma (on 2 L home O2), decompensated liver cirrhosis with history of hepatic encephalopathy, esophageal varices and thrombocytopenia, CK D III, hypertension, hyperlipidemia, gout, depression, anxiety, GERD, presented to the ED with chest pain associated with shortness of breath, fevers and chills for past 3 days. Also requiring more oxygen than usual. Patient was found to have SIRS with positive influenza B and possible lobar pneumonia. (Patchy infiltrates right lung base). Admitted to stepdown unit for further management.   Subjective:    Reports her breathing to be slightly better. Very anxious.   Assessment  & Plan :    Principal Problem:   SIRS with Acute on chronic respiratory failure with hypoxia (HCC) Secondary to combination of influenza B, right lobar pneumonia and COPD exacerbation. Admitted to stepdown.  -droplet  precautions. -Started Tamiflu and empiric IV vancomycin and cefepime. -IV Solu-Medrol and scheduled DuoNeb's. -Continue supportive oxygen and Mucinex. Follow cultures.  Transfer  to telemetry.  Active Problems: Decompensated hepatic cirrhosis. Has chronic pancytopenia. Elevated ammonia but with good mental status. Continue lactulose and zebeta.     Essential hypertension Continue Zebeta    CKD stage 3 due to type 2 diabetes mellitus ([place]) Mildly worsened function on admission. Avoid nephrotoxic agents.  Chronic diastolic CHF Compensated at present. Continue aspirin and beta blocker. Holding Lasix at present.  Chest pain Appears to be pleuritic due to pneumonia. No further symptoms. Serial troponin negative and stable on telemetry.  Anxiety and depression Resume home medications    Code Status : Partial cord (DO NOT INTUBATE)  Family Communication  : None at bedside  Disposition Plan  : Home once improved  Barriers For Discharge : Active  symptoms  Consults  :  None  Procedures  : None  DVT Prophylaxis  :  SCDs  Lab Results  Component Value Date   PLT 64 (L) 11/07/2016    Antibiotics  :    Anti-infectives    Start     Dose/Rate Route Frequency Ordered Stop   11/08/16 2000  vancomycin (VANCOCIN) 1,250 mg in sodium chloride 0.9 % 250 mL IVPB     1,250 mg 166.7 mL/hr over 90 Minutes Intravenous Every 24 hours 11/07/16 2012     11/08/16 0800  ceFEPIme (MAXIPIME) 1 g in dextrose 5 % 50 mL IVPB     1 g 100 mL/hr over 30 Minutes Intravenous Every 12 hours 11/07/16 2012     11/08/16 0100  oseltamivir (TAMIFLU) capsule 75 mg  Status:  Discontinued     75 mg Oral 2 times daily 11/08/16 0052 11/08/16 0058   11/08/16 0100  oseltamivir (TAMIFLU) capsule 30 mg     30 mg Oral 2 times daily 11/08/16 0059 11/12/16 2159   11/07/16 2015  vancomycin (VANCOCIN) 1,500 mg in sodium chloride 0.9 % 500 mL IVPB     1,500 mg 250 mL/hr over 120 Minutes Intravenous  Once 11/07/16 2007 11/07/16 2310   11/07/16 2000  ceFEPIme (MAXIPIME) 2 g in dextrose 5 % 50 mL IVPB     2 g 100 mL/hr over 30 Minutes Intravenous  Once 11/07/16 1957 11/07/16 2141   11/07/16 2000  vancomycin (VANCOCIN) IVPB 1000 mg/200 mL premix  Status:  Discontinued     1,000 mg 200 mL/hr over 60 Minutes Intravenous  Once 11/07/16 1957 11/07/16 2007        Objective:   Vitals:   11/08/16 0430 11/08/16  0724 11/08/16 0727 11/08/16 1225  BP: (!) 128/51 137/66    Pulse: 92 84    Resp: 15 16    Temp: 97.8 F (36.6 C) 97.8 F (36.6 C)    TempSrc: Oral Oral    SpO2: 96% 96% 98% 98%  Weight: 80.9 kg (178 lb 5.6 oz)     Height: 5' 2"  (1.575 m)       Wt Readings from Last 3 Encounters:  11/08/16 80.9 kg (178 lb 5.6 oz)  11/07/16 78.9 kg (174 lb)  10/18/16 78.9 kg (174 lb)     Intake/Output Summary (Last 24 hours) at 11/08/16 1319 Last data filed at 11/08/16 0828  Gross per 24 hour  Intake              500 ml  Output                2 ml  Net              498 ml     Physical Exam  Gen: not in distress,a nxious HEENT: , moist mucosa, supple neck Chest: Scattered rhonchi bilaterally CVS: N S1&S2, no murmurs, GI: soft, NT, ND,  Musculoskeletal: warm, no edema CNS: Alert and oriented, nonfocal    Data Review:    CBC  Recent Labs Lab 11/07/16 1823 11/07/16 2043  WBC 3.6* 3.5*  HGB 10.9* 10.4*  HCT 33.9* 33.5*  PLT 70* 64*  MCV 104.0* 103.7*  MCH 33.4 32.2  MCHC 32.2 31.0  RDW 14.9 14.6  LYMPHSABS  --  0.8  MONOABS  --  0.3  EOSABS  --  0.1  BASOSABS  --  0.0    Chemistries   Recent Labs Lab 11/07/16 1823  NA 132*  K 4.3  CL 93*  CO2 31  GLUCOSE 132*  BUN 10  CREATININE 1.26*  CALCIUM 8.7*   ------------------------------------------------------------------------------------------------------------------ No results for input(s): CHOL, HDL, LDLCALC, TRIG, CHOLHDL, LDLDIRECT in the last 72 hours.  Lab Results  Component Value Date   HGBA1C 6.9 (H) 10/12/2016   ------------------------------------------------------------------------------------------------------------------ No results for input(s): TSH, T4TOTAL, T3FREE, THYROIDAB in the last 72 hours.  Invalid input(s): FREET3 ------------------------------------------------------------------------------------------------------------------ No results for input(s): VITAMINB12, FOLATE, FERRITIN,  TIBC, IRON, RETICCTPCT in the last 72 hours.  Coagulation profile No results for input(s): INR, PROTIME in the last 168 hours.  No results for input(s): DDIMER in the last 72 hours.  Cardiac Enzymes  Recent Labs Lab 11/08/16 0000 11/08/16 0630 11/08/16 1148  TROPONINI <0.03 <0.03 <0.03   ------------------------------------------------------------------------------------------------------------------    Component Value Date/Time   BNP 118.7 (H) 11/08/2016 0434   BNP 43.2 12/17/2014 1204    Inpatient Medications  Scheduled Meds: . allopurinol  300 mg Oral Daily  . aspirin EC  81 mg Oral QHS  . bisoprolol  10 mg Oral Daily  . ceFEPime (MAXIPIME) IV  1 g Intravenous Q12H  . citalopram  40 mg Oral Daily  . dextromethorphan-guaiFENesin  1 tablet Oral BID  . ferrous sulfate  325 mg Oral Daily  . gabapentin  300 mg Oral TID  . ipratropium-albuterol  3 mL Nebulization Q4H  . lactulose  30 g Oral Daily  . magnesium oxide  200 mg Oral QHS  . meclizine  25 mg Oral BID  . methylPREDNISolone (SOLU-MEDROL) injection  60 mg Intravenous Q8H  . oseltamivir  30 mg Oral BID  . pantoprazole  40 mg Oral Daily  . traMADol  50 mg Oral Q8H  . triamcinolone cream   Topical BID  . vancomycin  1,250 mg Intravenous Q24H   Continuous Infusions: PRN Meds:.acetaminophen, albuterol, ALPRAZolam, cyclobenzaprine, hydrALAZINE  Micro Results Recent Results (from the past 240 hour(s))  MRSA PCR Screening     Status: None   Collection Time: 11/08/16  4:45 AM  Result Value Ref Range Status   MRSA by PCR NEGATIVE NEGATIVE Final    Comment:        The GeneXpert MRSA Assay (FDA approved for NASAL specimens only), is one component of a comprehensive MRSA colonization surveillance program. It is not intended to diagnose MRSA infection nor to guide or monitor treatment for MRSA infections.     Radiology Reports Dg Chest 2 View  Result Date: 11/07/2016 CLINICAL DATA:  Shortness of breath with  productive cough EXAM: CHEST  2 VIEW COMPARISON:  10/31/2016 FINDINGS: Status post right shoulder replacement. Partially visualized hardware in the cervical spine. Patchy infiltrate at the lingula and right base. No effusion. Stable cardiomediastinal silhouette with atherosclerosis. No pneumothorax. Surgical clips in the right upper quadrant. Post augmentation changes of L1. IMPRESSION: Patchy infiltrates within the lingula and right lung base. Electronically Signed   By: Donavan Foil M.D.   On: 11/07/2016 19:23   Dg Chest 2 View  Result Date: 10/31/2016 CLINICAL DATA:  Former smoker, recent pneumonia, history COPD, cirrhosis, hypertension EXAM: CHEST  2 VIEW COMPARISON:  10/12/2016 FINDINGS: Minimal enlargement of cardiac silhouette. Atherosclerotic calcification aorta. Mediastinal contours and pulmonary vascularity normal. Persistent subsegmental RIGHT middle lobe on lateral view. Lungs otherwise clear. No pleural effusion or pneumothorax. Bones demineralized with evidence of prior cervical spine fusion, RIGHT shoulder arthroplasty, and L1 vertebral augmentation. IMPRESSION: Persistent subsegmental atelectasis RIGHT middle lobe. Electronically Signed   By: Lavonia Dana  M.D.   On: 10/31/2016 16:12   Dg Chest 2 View  Result Date: 10/12/2016 CLINICAL DATA:  Fever for 1 week.  Nausea. EXAM: CHEST  2 VIEW COMPARISON:  01/19/2016 FINDINGS: There is mild right lower lobe airspace disease which may reflect atelectasis versus pneumonia. There is no pleural effusion or pneumothorax. The heart and mediastinal contours are unremarkable. The osseous structures are unremarkable. IMPRESSION: Mild right lower lobe airspace disease which may reflect atelectasis versus pneumonia. Followup PA and lateral chest X-ray is recommended in 3-4 weeks following trial of antibiotic therapy to ensure resolution and exclude underlying malignancy. Electronically Signed   By: Kathreen Devoid   On: 10/12/2016 14:59   US Abdomen  Limited  Result Date: 10/12/2016 CLINICAL DATA:  Cirrhosis with paraesophageal varices and splenomegaly. Abdominal pain. EXAM: LIMITED ABDOMEN ULTRASOUND FOR ASCITES TECHNIQUE: Limited ultrasound survey for ascites was performed in all four abdominal quadrants. COMPARISON:  Abdominal CT 07/11/2016 FINDINGS: No varices are present. An extra renal collecting system is again seen in the right kidney. The spleen is enlarged. IMPRESSION: 1. No abdominal ascites. 2. 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 3. Splenomegaly. Electronically Signed   By: San Morelle M.D.   On: 10/12/2016 20:21   Ct Maxillofacial Wo Contrast  Result Date: 10/12/2016 CLINICAL DATA:  71 y/o F; recent root canal with complaint of lower jaw soreness and sinus pain. EXAM: CT MAXILLOFACIAL WITHOUT CONTRAST TECHNIQUE: Multidetector CT imaging of the maxillofacial structures was performed. Multiplanar CT image reconstructions were also generated. A small metallic BB was placed on the right temple in order to reliably differentiate right from left. COMPARISON:  09/03/2014 CT of the head. FINDINGS: Osseous: No fracture or mandibular dislocation. No destructive process. Orbits: No traumatic or inflammatory finding. Bilateral intra-ocular lens replacement. Sinuses: Bilateral mastoid tip opacification and sclerosis compatible sequelae of chronic otomastoiditis. Mastoid air cells are otherwise normally aerated. Borderline high-riding right jugular bulb. Paranasal sinuses are normally aerated. Soft tissues: Negative. Limited intracranial: No significant or unexpected finding. IMPRESSION: 1. No bony fracture or destructive process. Normal soft tissues. Normally aerated paranasal sinuses. 2. No finding as explanation for pain identified. Electronically Signed   By: Kristine Garbe M.D.   On: 10/12/2016 22:47    Time Spent in minutes  35   Louellen Molder M.D on 11/08/2016 at 1:19 PM  Between 7am to 7pm - Pager -  [phone]  After 7pm go to [email or web address] - password [facility]  [facility] -  Office  [phone]

## 2016-11-09 DIAGNOSIS — N183 Chronic kidney disease, stage 3 (moderate): Secondary | ICD-10-CM

## 2016-11-09 DIAGNOSIS — E1122 Type 2 diabetes mellitus with diabetic chronic kidney disease: Secondary | ICD-10-CM

## 2016-11-09 LAB — LEGIONELLA PNEUMOPHILA SEROGP 1 UR AG: L. pneumophila Serogp 1 Ur Ag: NEGATIVE

## 2016-11-09 MED ORDER — LEVOFLOXACIN 750 MG PO TABS
750.0000 mg | ORAL_TABLET | ORAL | Status: DC
  Administered 2016-11-09: 750 mg via ORAL
  Filled 2016-11-09: qty 1

## 2016-11-09 MED ORDER — IPRATROPIUM-ALBUTEROL 0.5-2.5 (3) MG/3ML IN SOLN
3.0000 mL | RESPIRATORY_TRACT | Status: DC
  Administered 2016-11-09 – 2016-11-10 (×7): 3 mL via RESPIRATORY_TRACT
  Filled 2016-11-09 (×8): qty 3

## 2016-11-09 MED ORDER — FERROUS SULFATE 325 (65 FE) MG PO TABS
325.0000 mg | ORAL_TABLET | Freq: Every day | ORAL | Status: DC
  Administered 2016-11-10 – 2016-11-11 (×2): 325 mg via ORAL
  Filled 2016-11-09 (×2): qty 1

## 2016-11-09 NOTE — Progress Notes (Signed)
PROGRESS NOTE                                                                                                                                                                                                             Patient Demographics:    [name], is a 71 y.o. female, DOB - Mar 26, 1946, YQM:[id number]  Admit date - 11/07/2016   Admitting Physician Ivor Costa, MD  Outpatient Primary MD for the patient is Alesia Richards, MD  LOS - 2  Outpatient Specialists: NONE  Chief Complaint  Patient presents with  . Chest Pain       Brief Narrative  71 year old female with history of COPD and asthma (on 2 L home O2), decompensated liver cirrhosis with history of hepatic encephalopathy, esophageal varices and thrombocytopenia, CK D III, hypertension, hyperlipidemia, gout, depression, anxiety, GERD, presented to the ED with chest pain associated with shortness of breath, fevers and chills for past 3 days. Also requiring more oxygen than usual. Patient was found to have SIRS with positive influenza B and possible lobar pneumonia. (Patchy infiltrates right lung base). Admitted to stepdown unit for further management.   Subjective:    Reports her breathing to be better. Still wheezy on exam. Wants to go home by Sunday since its her 71th birthday   Assessment  & Plan :    Principal Problem:   SIRS with Acute on chronic respiratory failure with hypoxia ([place]) Secondary to combination of influenza B, right lobar pneumonia and COPD exacerbation. Admitted to stepdown.  -droplet  precautions. -on Tamiflu . cx negative. Narrow abx to levaquin. -IV Solu-Medrol and scheduled DuoNeb's. -Continue supportive oxygen and Mucinex.     Active Problems: Decompensated hepatic cirrhosis. Has chronic pancytopenia. Elevated ammonia but with good mental status. Continue lactulose and zebeta.     Essential hypertension Continue  Zebeta     CKD stage 3 due to type 2 diabetes mellitus ([place]) Mildly worsened function on admission. Recheck in am  Chronic diastolic CHF Compensated at present. Continue aspirin and beta blocker. Holding Lasix at present.  Chest pain Appears to be pleuritic due to pneumonia. No further symptoms. Serial troponin negative and stable on telemetry.  Anxiety and depression Continue home medications    Code Status : Partial cord (DO NOT INTUBATE)  Family Communication  : None at bedside  Disposition Plan  :  Home once improved ; possibly 4/8  Barriers For Discharge : Active symptoms  Consults  :  None  Procedures  : None  DVT Prophylaxis  :  SCDs  Lab Results  Component Value Date   PLT 64 (L) 11/07/2016    Antibiotics  :    Anti-infectives    Start     Dose/Rate Route Frequency Ordered Stop   11/08/16 2000  vancomycin (VANCOCIN) 1,250 mg in sodium chloride 0.9 % 250 mL IVPB     1,250 mg 166.7 mL/hr over 90 Minutes Intravenous Every 24 hours 11/07/16 2012     11/08/16 0800  ceFEPIme (MAXIPIME) 1 g in dextrose 5 % 50 mL IVPB     1 g 100 mL/hr over 30 Minutes Intravenous Every 12 hours 11/07/16 2012     11/08/16 0100  oseltamivir (TAMIFLU) capsule 75 mg  Status:  Discontinued     75 mg Oral 2 times daily 11/08/16 0052 11/08/16 0058   11/08/16 0100  oseltamivir (TAMIFLU) capsule 30 mg     30 mg Oral 2 times daily 11/08/16 0059 11/12/16 2159   11/07/16 2015  vancomycin (VANCOCIN) 1,500 mg in sodium chloride 0.9 % 500 mL IVPB     1,500 mg 250 mL/hr over 120 Minutes Intravenous  Once 11/07/16 2007 11/07/16 2310   11/07/16 2000  ceFEPIme (MAXIPIME) 2 g in dextrose 5 % 50 mL IVPB     2 g 100 mL/hr over 30 Minutes Intravenous  Once 11/07/16 1957 11/07/16 2141   11/07/16 2000  vancomycin (VANCOCIN) IVPB 1000 mg/200 mL premix  Status:  Discontinued     1,000 mg 200 mL/hr over 60 Minutes Intravenous  Once 11/07/16 1957 11/07/16 2007        Objective:   Vitals:    11/08/16 2055 11/09/16 0017 11/09/16 0315 11/09/16 0825  BP: (!) 133/48 (!) 137/56 (!) 113/55   Pulse: 88 88 86   Resp: 18 18 18    Temp: 98.1 F (36.7 C) 98 F (36.7 C) 98.2 F (36.8 C)   TempSrc: Oral Oral Oral   SpO2: 99% 97% 92% 93%  Weight:   81.5 kg (179 lb 11.2 oz)   Height:        Wt Readings from Last 3 Encounters:  11/09/16 81.5 kg (179 lb 11.2 oz)  11/07/16 78.9 kg (174 lb)  10/18/16 78.9 kg (174 lb)     Intake/Output Summary (Last 24 hours) at 11/09/16 0918 Last data filed at 11/09/16 0600  Gross per 24 hour  Intake              870 ml  Output              400 ml  Net              470 ml     Physical Exam  Gen: not in distress HEENT: , moist mucosa, supple neck Chest: diffuse wheezing b/l CVS: N S1&S2, no murmurs, GI: soft, NT, ND,  Musculoskeletal: warm, no edema     Data Review:    CBC  Recent Labs Lab 11/07/16 1823 11/07/16 2043  WBC 3.6* 3.5*  HGB 10.9* 10.4*  HCT 33.9* 33.5*  PLT 70* 64*  MCV 104.0* 103.7*  MCH 33.4 32.2  MCHC 32.2 31.0  RDW 14.9 14.6  LYMPHSABS  --  0.8  MONOABS  --  0.3  EOSABS  --  0.1  BASOSABS  --  0.0    Chemistries   Recent Labs  Lab 11/07/16 1823  NA 132*  K 4.3  CL 93*  CO2 31  GLUCOSE 132*  BUN 10  CREATININE 1.26*  CALCIUM 8.7*   ------------------------------------------------------------------------------------------------------------------ No results for input(s): CHOL, HDL, LDLCALC, TRIG, CHOLHDL, LDLDIRECT in the last 72 hours.  Lab Results  Component Value Date   HGBA1C 6.9 (H) 10/12/2016   ------------------------------------------------------------------------------------------------------------------ No results for input(s): TSH, T4TOTAL, T3FREE, THYROIDAB in the last 72 hours.  Invalid input(s): FREET3 ------------------------------------------------------------------------------------------------------------------ No results for input(s): VITAMINB12, FOLATE, FERRITIN, TIBC,  IRON, RETICCTPCT in the last 72 hours.  Coagulation profile No results for input(s): INR, PROTIME in the last 168 hours.  No results for input(s): DDIMER in the last 72 hours.  Cardiac Enzymes  Recent Labs Lab 11/08/16 0000 11/08/16 0630 11/08/16 1148  TROPONINI <0.03 <0.03 <0.03   ------------------------------------------------------------------------------------------------------------------    Component Value Date/Time   BNP 118.7 (H) 11/08/2016 0434   BNP 43.2 12/17/2014 1204    Inpatient Medications  Scheduled Meds: . allopurinol  300 mg Oral Daily  . aspirin EC  81 mg Oral QHS  . bisoprolol  10 mg Oral Daily  . ceFEPime (MAXIPIME) IV  1 g Intravenous Q12H  . citalopram  40 mg Oral Daily  . dextromethorphan-guaiFENesin  1 tablet Oral BID  . ferrous sulfate  325 mg Oral Daily  . gabapentin  300 mg Oral TID  . ipratropium-albuterol  3 mL Nebulization QID  . lactulose  30 g Oral Daily  . magnesium oxide  200 mg Oral QHS  . meclizine  25 mg Oral BID  . methylPREDNISolone (SOLU-MEDROL) injection  60 mg Intravenous Q8H  . oseltamivir  30 mg Oral BID  . pantoprazole  40 mg Oral Daily  . traMADol  50 mg Oral Q8H  . triamcinolone cream   Topical BID  . vancomycin  1,250 mg Intravenous Q24H   Continuous Infusions: PRN Meds:.acetaminophen, albuterol, ALPRAZolam, cyclobenzaprine, hydrALAZINE  Micro Results Recent Results (from the past 240 hour(s))  Blood Culture (routine x 2)     Status: None (Preliminary result)   Collection Time: 11/07/16  8:35 PM  Result Value Ref Range Status   Specimen Description BLOOD RIGHT HAND  Final   Special Requests IN PEDIATRIC BOTTLE Blood Culture adequate volume  Final   Culture NO GROWTH < 24 HOURS  Final   Report Status PENDING  Incomplete  Blood Culture (routine x 2)     Status: None (Preliminary result)   Collection Time: 11/07/16  8:43 PM  Result Value Ref Range Status   Specimen Description BLOOD LEFT ARM  Final   Special  Requests   Final    BOTTLES DRAWN AEROBIC AND ANAEROBIC Blood Culture adequate volume   Culture NO GROWTH < 24 HOURS  Final   Report Status PENDING  Incomplete  MRSA PCR Screening     Status: None   Collection Time: 11/08/16  4:45 AM  Result Value Ref Range Status   MRSA by PCR NEGATIVE NEGATIVE Final    Comment:        The GeneXpert MRSA Assay (FDA approved for NASAL specimens only), is one component of a comprehensive MRSA colonization surveillance program. It is not intended to diagnose MRSA infection nor to guide or monitor treatment for MRSA infections.     Radiology Reports Dg Chest 2 View  Result Date: 11/07/2016 CLINICAL DATA:  Shortness of breath with productive cough EXAM: CHEST  2 VIEW COMPARISON:  10/31/2016 FINDINGS: Status post right shoulder replacement. Partially visualized hardware in  the cervical spine. Patchy infiltrate at the lingula and right base. No effusion. Stable cardiomediastinal silhouette with atherosclerosis. No pneumothorax. Surgical clips in the right upper quadrant. Post augmentation changes of L1. IMPRESSION: Patchy infiltrates within the lingula and right lung base. Electronically Signed   By: Donavan Foil M.D.   On: 11/07/2016 19:23   Dg Chest 2 View  Result Date: 10/31/2016 CLINICAL DATA:  Former smoker, recent pneumonia, history COPD, cirrhosis, hypertension EXAM: CHEST  2 VIEW COMPARISON:  10/12/2016 FINDINGS: Minimal enlargement of cardiac silhouette. Atherosclerotic calcification aorta. Mediastinal contours and pulmonary vascularity normal. Persistent subsegmental RIGHT middle lobe on lateral view. Lungs otherwise clear. No pleural effusion or pneumothorax. Bones demineralized with evidence of prior cervical spine fusion, RIGHT shoulder arthroplasty, and L1 vertebral augmentation. IMPRESSION: Persistent subsegmental atelectasis RIGHT middle lobe. Electronically Signed   By: Lavonia Dana M.D.   On: 10/31/2016 16:12   Dg Chest 2 View  Result Date:  10/12/2016 CLINICAL DATA:  Fever for 1 week.  Nausea. EXAM: CHEST  2 VIEW COMPARISON:  01/19/2016 FINDINGS: There is mild right lower lobe airspace disease which may reflect atelectasis versus pneumonia. There is no pleural effusion or pneumothorax. The heart and mediastinal contours are unremarkable. The osseous structures are unremarkable. IMPRESSION: Mild right lower lobe airspace disease which may reflect atelectasis versus pneumonia. Followup PA and lateral chest X-ray is recommended in 3-4 weeks following trial of antibiotic therapy to ensure resolution and exclude underlying malignancy. Electronically Signed   By: Kathreen Devoid   On: 10/12/2016 14:59   US Abdomen Limited  Result Date: 10/12/2016 CLINICAL DATA:  Cirrhosis with paraesophageal varices and splenomegaly. Abdominal pain. EXAM: LIMITED ABDOMEN ULTRASOUND FOR ASCITES TECHNIQUE: Limited ultrasound survey for ascites was performed in all four abdominal quadrants. COMPARISON:  Abdominal CT 07/11/2016 FINDINGS: No varices are present. An extra renal collecting system is again seen in the right kidney. The spleen is enlarged. IMPRESSION: 1. No abdominal ascites. 2. 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 3. Splenomegaly. Electronically Signed   By: San Morelle M.D.   On: 10/12/2016 20:21   Ct Maxillofacial Wo Contrast  Result Date: 10/12/2016 CLINICAL DATA:  71 y/o F; recent root canal with complaint of lower jaw soreness and sinus pain. EXAM: CT MAXILLOFACIAL WITHOUT CONTRAST TECHNIQUE: Multidetector CT imaging of the maxillofacial structures was performed. Multiplanar CT image reconstructions were also generated. A small metallic BB was placed on the right temple in order to reliably differentiate right from left. COMPARISON:  09/03/2014 CT of the head. FINDINGS: Osseous: No fracture or mandibular dislocation. No destructive process. Orbits: No traumatic or inflammatory finding. Bilateral intra-ocular lens replacement. Sinuses:  Bilateral mastoid tip opacification and sclerosis compatible sequelae of chronic otomastoiditis. Mastoid air cells are otherwise normally aerated. Borderline high-riding right jugular bulb. Paranasal sinuses are normally aerated. Soft tissues: Negative. Limited intracranial: No significant or unexpected finding. IMPRESSION: 1. No bony fracture or destructive process. Normal soft tissues. Normally aerated paranasal sinuses. 2. No finding as explanation for pain identified. Electronically Signed   By: Kristine Garbe M.D.   On: 10/12/2016 22:47    Time Spent in minutes  35   Louellen Molder M.D on 11/09/2016 at 9:18 AM  Between 7am to 7pm - Pager - [phone]  After 7pm go to [email or web address] - password [facility]  [facility] -  Office  [phone]

## 2016-11-09 NOTE — Progress Notes (Signed)
Patient stable last night, did mention she was feeling depressed (per nurse tech).

## 2016-11-09 NOTE — Progress Notes (Signed)
Pharmacy Antibiotic Note  [name] is a 71 y.o. female admitted on 11/07/2016 with pneumonia.    Vancomycin / Cefepime changed to Levaquin   Plan: Antibiotics changed to Levaquin 750 mg po Q 48 hours for additional 5 days  Pharmacy to sign off and follow peripherally for changes in renal function    Height: 5' 2.5" (158.8 cm) Weight: 179 lb 11.2 oz (81.5 kg) (scale a) IBW/kg (Calculated) : 51.25  Temp (24hrs), Avg:98.1 F (36.7 C), Min:97.4 F (36.3 C), Max:98.6 F (37 C)   Recent Labs Lab 11/07/16 1823 11/07/16 2043 11/07/16 2050 11/08/16 0025  WBC 3.6* 3.5*  --   --   CREATININE 1.26*  --   --   --   LATICACIDVEN  --   --  1.15 1.05    Estimated Creatinine Clearance: 42.2 mL/min (A) (by C-G formula based on SCr of 1.26 mg/dL (H)).    Allergies  Allergen Reactions  . Atorvastatin Other (See Comments)    Doesn't remember  . Diphenhydramine Hcl (Sleep) Hives  . Hydrocodone-Acetaminophen Other (See Comments)    Doesn't remember  . Lopid [Gemfibrozil] Other (See Comments)    Doesn't remember  . Loratadine Hives  . Lorazepam Hives  . Simvastatin Other (See Comments)    Unknown reaction  . Sulfamethoxazole Hives  . Sulfonamide Derivatives Hives    Thank you Anette Guarneri, PharmD [phone] 11/09/2016 9:43 AM

## 2016-11-09 NOTE — Progress Notes (Signed)
Called pharmacy to confirm no interaction with new PO antibiotic, Levaquin. Pharmacy stated iron and Levaquin have to be given 2 hours apart and she will re schedule appropriately  [name]

## 2016-11-09 NOTE — Consult Note (Signed)
   [facility] Inpatient Consult   11/09/2016  [name] May 21, 1946 [id number]    Patient was assessed for multiple admissions and re-admission.  Chart review reveals the patient is [name] is a 71 y.o. female with medical history significant of COPD, asthma on 2 L oxygen at home, hypertension, hyperlipidemia, GERD, gout, depression, anxiety, splenomegaly, IBS, liver cirrhosis, hepatic encephalopathy, esophageal varices, thrombocytopenia, CKD-3, who presents with chest pain, shortness of breath, fever and chills per MD H&P notes.  Admitted with Flu B.  Met with the patient at the bedside.  Patient states she has been wheezing and coughing a lot.  She uses home oxygen and lives with her husband.  She states she has home health coming out which recently started.  Explained [facility] for post hospital follow up.  Patient has been assigned EMMI phone calls for COPD.  She states she usually have her husband answer her calls.  She states sheis hopeful for going home by Sunday as Sunday is her birthday. Patient will have post hospital EMMI calls and [facility] Telephonic will follow up if patient has symptoms. Patient was given a brochure and contact information with the 24 hour nurse advise line.  For questions, please contact:  Natividad Brood, RN BSN [facility] Liaison  [phone] business mobile phone Toll free office [phone]

## 2016-11-09 NOTE — Care Management Note (Signed)
Case Management Note  Patient Details  Name: [name] MRN: [id number] Date of Birth: 26-Dec-1945  Subjective/Objective:     Admitted with Acute on Chronic Resp Failure              Action/Plan: Patient lives at home with spouse; PCP: Alesia Richards, MD; has private insurance with [employer] with prescription drug coverage; she is active with [facility] as prior to admission HHRN/ PT; has home oxygen; CM will continue to follow for DCP  Expected Discharge Date:    possibly 11/11/2016              Expected Discharge Plan:  [name]  In-House Referral:   [facility]  Discharge planning Services  CM Consult  Choice offered to:  Patient, Spouse    HH Arranged:  RN, Disease Management, PT HH Agency:  [place]  Status of Service:  In process, will continue to follow  Sherrilyn Rist [phone] 11/09/2016, 12:02 PM

## 2016-11-10 DIAGNOSIS — E876 Hypokalemia: Secondary | ICD-10-CM

## 2016-11-10 LAB — BASIC METABOLIC PANEL
ANION GAP: 11 (ref 5–15)
BUN: 24 mg/dL — ABNORMAL HIGH (ref 6–20)
CALCIUM: 9 mg/dL (ref 8.9–10.3)
CO2: 26 mmol/L (ref 22–32)
Chloride: 95 mmol/L — ABNORMAL LOW (ref 101–111)
Creatinine, Ser: 1.25 mg/dL — ABNORMAL HIGH (ref 0.44–1.00)
GFR calc Af Amer: 50 mL/min — ABNORMAL LOW (ref >60)
GFR, EST NON AFRICAN AMERICAN: 43 mL/min — AB (ref >60)
Glucose, Bld: 356 mg/dL — ABNORMAL HIGH (ref 65–99)
Potassium: 2.8 mmol/L — ABNORMAL LOW (ref 3.5–5.1)
Sodium: 132 mmol/L — ABNORMAL LOW (ref 135–145)

## 2016-11-10 LAB — GLUCOSE, CAPILLARY
GLUCOSE-CAPILLARY: 305 mg/dL — AB (ref 65–99)
GLUCOSE-CAPILLARY: 358 mg/dL — AB (ref 65–99)
Glucose-Capillary: 333 mg/dL — ABNORMAL HIGH (ref 65–99)
Glucose-Capillary: 302 mg/dL — ABNORMAL HIGH (ref 65–99)

## 2016-11-10 LAB — CBC
HEMATOCRIT: 30.8 % — AB (ref 36.0–46.0)
Hemoglobin: 9.8 g/dL — ABNORMAL LOW (ref 12.0–15.0)
MCH: 31.8 pg (ref 26.0–34.0)
MCHC: 31.8 g/dL (ref 30.0–36.0)
MCV: 100 fL (ref 78.0–100.0)
PLATELETS: 63 10*3/uL — AB (ref 150–400)
RBC: 3.08 MIL/uL — ABNORMAL LOW (ref 3.87–5.11)
RDW: 14.1 % (ref 11.5–15.5)
WBC: 5 10*3/uL (ref 4.0–10.5)

## 2016-11-10 LAB — MAGNESIUM: MAGNESIUM: 2.1 mg/dL (ref 1.7–2.4)

## 2016-11-10 MED ORDER — PREDNISONE 20 MG PO TABS: 40.0000 mg | ORAL_TABLET | Freq: Every day | ORAL | Status: DC

## 2016-11-10 MED ORDER — INSULIN ASPART 100 UNIT/ML ~~LOC~~ SOLN
0.0000 [IU] | Freq: Three times a day (TID) | SUBCUTANEOUS | Status: DC
  Administered 2016-11-10: 7 [IU] via SUBCUTANEOUS
  Administered 2016-11-10: 9 [IU] via SUBCUTANEOUS

## 2016-11-10 MED ORDER — POTASSIUM CHLORIDE CRYS ER 20 MEQ PO TBCR
40.0000 meq | EXTENDED_RELEASE_TABLET | ORAL | Status: AC
  Administered 2016-11-10 (×2): 40 meq via ORAL
  Filled 2016-11-10 (×2): qty 2

## 2016-11-10 MED ORDER — PREDNISONE 20 MG PO TABS
40.0000 mg | ORAL_TABLET | Freq: Every day | ORAL | Status: DC
  Administered 2016-11-10 – 2016-11-11 (×2): 40 mg via ORAL
  Filled 2016-11-10 (×2): qty 2

## 2016-11-10 MED ORDER — IPRATROPIUM-ALBUTEROL 0.5-2.5 (3) MG/3ML IN SOLN
3.0000 mL | Freq: Four times a day (QID) | RESPIRATORY_TRACT | Status: DC
  Filled 2016-11-10: qty 3

## 2016-11-10 NOTE — Progress Notes (Signed)
Talked to MD in hallway, he stated he would add oral prednisone and discharge pt in the morning. MD aware no morning insulin coverage ordered and coverage begins at lunch. No change in insulin order. MD aware pt has home oxygen set up and nebulizers per pt  [name]

## 2016-11-10 NOTE — Progress Notes (Addendum)
PROGRESS NOTE                                                                                                                                                                                                             Patient Demographics:    [name], is a 71 y.o. female, DOB - [birth year], QMV:[id number]  Admit date - 11/07/2016   Admitting Physician Ivor Costa, MD  Outpatient Primary MD for the patient is Alesia Richards, MD  LOS - 3  Outpatient Specialists: NONE  Chief Complaint  Patient presents with  . Chest Pain       Brief Narrative  71 year old female with history of COPD and asthma (on 2 L home O2), decompensated liver cirrhosis with history of hepatic encephalopathy, esophageal varices and thrombocytopenia, CK D III, hypertension, hyperlipidemia, gout, depression, anxiety, GERD, presented to the ED with chest pain associated with shortness of breath, fevers and chills for past 3 days. Also requiring more oxygen than usual. Patient was found to have SIRS with positive influenza B and possible lobar pneumonia. (Patchy infiltrates right lung base). Admitted to stepdown unit for further management.   Subjective:   Breathing improving. Still wants to go home in am for her birthday.   Assessment  & Plan :    Principal Problem:   SIRS with Acute on chronic respiratory failure with hypoxia (HCC) Secondary to combination of influenza B, right lobar pneumonia and COPD exacerbation. Admitted to stepdown.  -droplet  precautions. -on Tamiflu. cx negative. Narrowed abx to levaquin. -narrow to oral prednisone and scheduled DuoNeb's. -Continue supportive oxygen and Mucinex.     Active Problems:  Hypokalemia Replenish  Decompensated hepatic cirrhosis. Has chronic pancytopenia. Elevated ammonia but with good mental status. Continue lactulose and zebeta.   Essential hypertension Continue Zebeta    CKD stage 3 due to type 2 diabetes mellitus ([place]) Mildly worsened function on admission. Recheck in am  Chronic diastolic CHF Compensated at present. Continue aspirin and beta blocker. Resume lasix.    Anxiety and depression Continue home medications    Code Status : Partial cord (DO NOT INTUBATE)  Family Communication  : None at bedside  Disposition Plan  : Home  In am if improved  Barriers For Discharge : Active symptoms  Consults  :  None  Procedures  : None  DVT Prophylaxis  :  SCDs  Lab Results  Component Value Date   PLT 63 (L) 11/10/2016    Antibiotics  :    Anti-infectives    Start     Dose/Rate Route Frequency Ordered Stop   11/09/16 1400  levofloxacin (LEVAQUIN) tablet 750 mg     750 mg Oral Every 48 hours 11/09/16 0940 11/15/16 1359   11/08/16 2000  vancomycin (VANCOCIN) 1,250 mg in sodium chloride 0.9 % 250 mL IVPB  Status:  Discontinued     1,250 mg 166.7 mL/hr over 90 Minutes Intravenous Every 24 hours 11/07/16 2012 11/09/16 0935   11/08/16 0800  ceFEPIme (MAXIPIME) 1 g in dextrose 5 % 50 mL IVPB  Status:  Discontinued     1 g 100 mL/hr over 30 Minutes Intravenous Every 12 hours 11/07/16 2012 11/09/16 0935   11/08/16 0100  oseltamivir (TAMIFLU) capsule 75 mg  Status:  Discontinued     75 mg Oral 2 times daily 11/08/16 0052 11/08/16 0058   11/08/16 0100  oseltamivir (TAMIFLU) capsule 30 mg     30 mg Oral 2 times daily 11/08/16 0059 11/12/16 2159   11/07/16 2015  vancomycin (VANCOCIN) 1,500 mg in sodium chloride 0.9 % 500 mL IVPB     1,500 mg 250 mL/hr over 120 Minutes Intravenous  Once 11/07/16 2007 11/07/16 2310   11/07/16 2000  ceFEPIme (MAXIPIME) 2 g in dextrose 5 % 50 mL IVPB     2 g 100 mL/hr over 30 Minutes Intravenous  Once 11/07/16 1957 11/07/16 2141   11/07/16 2000  vancomycin (VANCOCIN) IVPB 1000 mg/200 mL premix  Status:  Discontinued     1,000 mg 200 mL/hr over 60 Minutes Intravenous  Once 11/07/16 1957 11/07/16 2007         Objective:   Vitals:   11/09/16 2123 11/10/16 0520 11/10/16 0734 11/10/16 0940  BP:  (!) 135/56  (!) 130/48  Pulse:  94  92  Resp:  18  20  Temp:  98.2 F (36.8 C)    TempSrc:  Oral    SpO2: 98% 96% 99% 94%  Weight:  83.2 kg (183 lb 6.4 oz)    Height:        Wt Readings from Last 3 Encounters:  11/10/16 83.2 kg (183 lb 6.4 oz)  11/07/16 78.9 kg (174 lb)  10/18/16 78.9 kg (174 lb)     Intake/Output Summary (Last 24 hours) at 11/10/16 1053 Last data filed at 11/10/16 0900  Gross per 24 hour  Intake             1400 ml  Output              701 ml  Net              699 ml     Physical Exam  Gen: not in distress HEENT: , moist mucosa, supple neck Chest: scattered wheezing b/l CVS: N S1&S2, no murmurs, GI: soft, NT, ND,  Musculoskeletal: warm, no edema     Data Review:    CBC  Recent Labs Lab 11/07/16 1823 11/07/16 2043 11/10/16 0325  WBC 3.6* 3.5* 5.0  HGB 10.9* 10.4* 9.8*  HCT 33.9* 33.5* 30.8*  PLT 70* 64* 63*  MCV 104.0* 103.7* 100.0  MCH 33.4 32.2 31.8  MCHC 32.2 31.0 31.8  RDW 14.9 14.6 14.1  LYMPHSABS  --  0.8  --   MONOABS  --  0.3  --   EOSABS  --  0.1  --  BASOSABS  --  0.0  --     Chemistries   Recent Labs Lab 11/07/16 1823 11/10/16 0325  NA 132* 132*  K 4.3 2.8*  CL 93* 95*  CO2 31 26  GLUCOSE 132* 356*  BUN 10 24*  CREATININE 1.26* 1.25*  CALCIUM 8.7* 9.0  MG  --  2.1   ------------------------------------------------------------------------------------------------------------------ No results for input(s): CHOL, HDL, LDLCALC, TRIG, CHOLHDL, LDLDIRECT in the last 72 hours.  Lab Results  Component Value Date   HGBA1C 6.9 (H) 10/12/2016   ------------------------------------------------------------------------------------------------------------------ No results for input(s): TSH, T4TOTAL, T3FREE, THYROIDAB in the last 72 hours.  Invalid input(s):  FREET3 ------------------------------------------------------------------------------------------------------------------ No results for input(s): VITAMINB12, FOLATE, FERRITIN, TIBC, IRON, RETICCTPCT in the last 72 hours.  Coagulation profile No results for input(s): INR, PROTIME in the last 168 hours.  No results for input(s): DDIMER in the last 72 hours.  Cardiac Enzymes  Recent Labs Lab 11/08/16 0000 11/08/16 0630 11/08/16 1148  TROPONINI <0.03 <0.03 <0.03   ------------------------------------------------------------------------------------------------------------------    Component Value Date/Time   BNP 118.7 (H) 11/08/2016 0434   BNP 43.2 12/17/2014 1204    Inpatient Medications  Scheduled Meds: . allopurinol  300 mg Oral Daily  . aspirin EC  81 mg Oral QHS  . bisoprolol  10 mg Oral Daily  . citalopram  40 mg Oral Daily  . dextromethorphan-guaiFENesin  1 tablet Oral BID  . ferrous sulfate  325 mg Oral Q breakfast  . gabapentin  300 mg Oral TID  . insulin aspart  0-9 Units Subcutaneous TID WC  . ipratropium-albuterol  3 mL Nebulization Q4H  . lactulose  30 g Oral Daily  . levofloxacin  750 mg Oral Q48H  . magnesium oxide  200 mg Oral QHS  . meclizine  25 mg Oral BID  . oseltamivir  30 mg Oral BID  . pantoprazole  40 mg Oral Daily  . potassium chloride  40 mEq Oral Q4H  . predniSONE  40 mg Oral Q breakfast  . traMADol  50 mg Oral Q8H  . triamcinolone cream   Topical BID   Continuous Infusions: PRN Meds:.acetaminophen, albuterol, ALPRAZolam, cyclobenzaprine, hydrALAZINE  Micro Results Recent Results (from the past 240 hour(s))  Blood Culture (routine x 2)     Status: None (Preliminary result)   Collection Time: 11/07/16  8:35 PM  Result Value Ref Range Status   Specimen Description BLOOD RIGHT HAND  Final   Special Requests IN PEDIATRIC BOTTLE Blood Culture adequate volume  Final   Culture NO GROWTH 2 DAYS  Final   Report Status PENDING  Incomplete   Blood Culture (routine x 2)     Status: None (Preliminary result)   Collection Time: 11/07/16  8:43 PM  Result Value Ref Range Status   Specimen Description BLOOD LEFT ARM  Final   Special Requests   Final    BOTTLES DRAWN AEROBIC AND ANAEROBIC Blood Culture adequate volume   Culture NO GROWTH 2 DAYS  Final   Report Status PENDING  Incomplete  MRSA PCR Screening     Status: None   Collection Time: 11/08/16  4:45 AM  Result Value Ref Range Status   MRSA by PCR NEGATIVE NEGATIVE Final    Comment:        The GeneXpert MRSA Assay (FDA approved for NASAL specimens only), is one component of a comprehensive MRSA colonization surveillance program. It is not intended to diagnose MRSA infection nor to guide or monitor treatment for MRSA infections.  Radiology Reports Dg Chest 2 View  Result Date: 11/07/2016 CLINICAL DATA:  Shortness of breath with productive cough EXAM: CHEST  2 VIEW COMPARISON:  10/31/2016 FINDINGS: Status post right shoulder replacement. Partially visualized hardware in the cervical spine. Patchy infiltrate at the lingula and right base. No effusion. Stable cardiomediastinal silhouette with atherosclerosis. No pneumothorax. Surgical clips in the right upper quadrant. Post augmentation changes of L1. IMPRESSION: Patchy infiltrates within the lingula and right lung base. Electronically Signed   By: Donavan Foil M.D.   On: 11/07/2016 19:23   Dg Chest 2 View  Result Date: 10/31/2016 CLINICAL DATA:  Former smoker, recent pneumonia, history COPD, cirrhosis, hypertension EXAM: CHEST  2 VIEW COMPARISON:  10/12/2016 FINDINGS: Minimal enlargement of cardiac silhouette. Atherosclerotic calcification aorta. Mediastinal contours and pulmonary vascularity normal. Persistent subsegmental RIGHT middle lobe on lateral view. Lungs otherwise clear. No pleural effusion or pneumothorax. Bones demineralized with evidence of prior cervical spine fusion, RIGHT shoulder arthroplasty, and L1  vertebral augmentation. IMPRESSION: Persistent subsegmental atelectasis RIGHT middle lobe. Electronically Signed   By: Lavonia Dana M.D.   On: 10/31/2016 16:12   Dg Chest 2 View  Result Date: 10/12/2016 CLINICAL DATA:  Fever for 1 week.  Nausea. EXAM: CHEST  2 VIEW COMPARISON:  01/19/2016 FINDINGS: There is mild right lower lobe airspace disease which may reflect atelectasis versus pneumonia. There is no pleural effusion or pneumothorax. The heart and mediastinal contours are unremarkable. The osseous structures are unremarkable. IMPRESSION: Mild right lower lobe airspace disease which may reflect atelectasis versus pneumonia. Followup PA and lateral chest X-ray is recommended in 3-4 weeks following trial of antibiotic therapy to ensure resolution and exclude underlying malignancy. Electronically Signed   By: Kathreen Devoid   On: 10/12/2016 14:59   US Abdomen Limited  Result Date: 10/12/2016 CLINICAL DATA:  Cirrhosis with paraesophageal varices and splenomegaly. Abdominal pain. EXAM: LIMITED ABDOMEN ULTRASOUND FOR ASCITES TECHNIQUE: Limited ultrasound survey for ascites was performed in all four abdominal quadrants. COMPARISON:  Abdominal CT 07/11/2016 FINDINGS: No varices are present. An extra renal collecting system is again seen in the right kidney. The spleen is enlarged. IMPRESSION: 1. No abdominal ascites. 2. 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 3. Splenomegaly. Electronically Signed   By: San Morelle M.D.   On: 10/12/2016 20:21   Ct Maxillofacial Wo Contrast  Result Date: 10/12/2016 CLINICAL DATA:  71 y/o F; recent root canal with complaint of lower jaw soreness and sinus pain. EXAM: CT MAXILLOFACIAL WITHOUT CONTRAST TECHNIQUE: Multidetector CT imaging of the maxillofacial structures was performed. Multiplanar CT image reconstructions were also generated. A small metallic BB was placed on the right temple in order to reliably differentiate right from left. COMPARISON:   09/03/2014 CT of the head. FINDINGS: Osseous: No fracture or mandibular dislocation. No destructive process. Orbits: No traumatic or inflammatory finding. Bilateral intra-ocular lens replacement. Sinuses: Bilateral mastoid tip opacification and sclerosis compatible sequelae of chronic otomastoiditis. Mastoid air cells are otherwise normally aerated. Borderline high-riding right jugular bulb. Paranasal sinuses are normally aerated. Soft tissues: Negative. Limited intracranial: No significant or unexpected finding. IMPRESSION: 1. No bony fracture or destructive process. Normal soft tissues. Normally aerated paranasal sinuses. 2. No finding as explanation for pain identified. Electronically Signed   By: Kristine Garbe M.D.   On: 10/12/2016 22:47    Time Spent in minutes  35   Louellen Molder M.D on 11/10/2016 at 10:53 AM  Between 7am to 7pm - Pager - [phone]  After 7pm go  to [email or web address] - password [facility]  [facility] -  Office  [phone]

## 2016-11-11 DIAGNOSIS — D696 Thrombocytopenia, unspecified: Secondary | ICD-10-CM

## 2016-11-11 LAB — BASIC METABOLIC PANEL
ANION GAP: 7 (ref 5–15)
BUN: 24 mg/dL — ABNORMAL HIGH (ref 6–20)
CHLORIDE: 100 mmol/L — AB (ref 101–111)
CO2: 28 mmol/L (ref 22–32)
Calcium: 8.9 mg/dL (ref 8.9–10.3)
Creatinine, Ser: 1.28 mg/dL — ABNORMAL HIGH (ref 0.44–1.00)
GFR calc non Af Amer: 41 mL/min — ABNORMAL LOW (ref >60)
GFR, EST AFRICAN AMERICAN: 48 mL/min — AB (ref >60)
Glucose, Bld: 264 mg/dL — ABNORMAL HIGH (ref 65–99)
POTASSIUM: 3.8 mmol/L (ref 3.5–5.1)
Sodium: 135 mmol/L (ref 135–145)

## 2016-11-11 LAB — GLUCOSE, CAPILLARY: Glucose-Capillary: 172 mg/dL — ABNORMAL HIGH (ref 65–99)

## 2016-11-11 MED ORDER — OSELTAMIVIR PHOSPHATE 30 MG PO CAPS: 30.0000 mg | ORAL_CAPSULE | Freq: Two times a day (BID) | ORAL | 0 refills | Status: AC

## 2016-11-11 MED ORDER — PREDNISONE 20 MG PO TABS: 20.0000 mg | ORAL_TABLET | Freq: Every day | ORAL | 0 refills | Status: DC

## 2016-11-11 MED ORDER — DM-GUAIFENESIN ER 30-600 MG PO TB12: 1.0000 | ORAL_TABLET | Freq: Two times a day (BID) | ORAL | 0 refills | Status: DC

## 2016-11-11 MED ORDER — LEVOFLOXACIN 750 MG PO TABS: 750.0000 mg | ORAL_TABLET | ORAL | 0 refills | Status: AC

## 2016-11-11 NOTE — Progress Notes (Signed)
Pt has orders to be discharged. Discharge instructions given and pt has no additional questions at this time. Medication regimen reviewed and pt educated. Pt verbalized understanding and has no additional questions. Telemetry box removed. IV removed and site in good condition. Pt stable and waiting for transportation.  Raniyah Curenton RN 

## 2016-11-11 NOTE — Discharge Summary (Signed)
Physician Discharge Summary  [name] JOI:[id number] DOB: Apr 16, 1946 DOA: 11/07/2016  PCP: Alesia Richards, MD  Admit date: 11/07/2016 Discharge date: 11/11/2016  Admitted From: Home Disposition:  Home  Recommendations for Outpatient Follow-up:  1. Follow up with PCP in 1-2 weeks 2. Patient was completely total 5 day course of antibiotics on 4/10 3. Patient will complete 5 day course of Tamiflu on 4/10.  Home Health: None Equipment/Devices: Oxygen (2 L via nasal cannula continuously)  Discharge Condition: Fair CODE STATUS: Partial (DO NOT INTUBATE) Diet recommendation: carb modified    Discharge Diagnoses:  Principal Problem:   Acute on chronic respiratory failure with hypoxia (HCC)   Active Problems:   COPD with acute exacerbation (HCC)   HCAP (healthcare-associated pneumonia)   Influenza B   Hepatic cirrhosis (HCC)   Esophageal varices (HCC)   Essential hypertension   GERD   Thrombocytopenia (HCC)   CKD stage 3 due to type 2 diabetes mellitus (HCC)   Hypokalemia   DM type 2 causing CKD stage 3 ([place])   Brief narrative/history of present illness 71 year old female with history of COPD and asthma (on 2 L home O2), decompensated liver cirrhosis with history of hepatic encephalopathy, esophageal varices and thrombocytopenia, CK D III, hypertension, hyperlipidemia, gout, depression, anxiety, GERD, presented to the ED with chest pain associated with shortness of breath, fevers and chills for past 3 days. Also requiring more oxygen than usual. Patient was found to have SIRS with positive influenza B and possible lobar pneumonia. (Patchy infiltrates right lung base). Admitted to stepdown unit for further management.   Principal Problem:   SIRS with Acute on chronic respiratory failure with hypoxia (HCC) Secondary to combination of influenza B, right lobar pneumonia and COPD exacerbation. Admitted to stepdown.  -droplet  precautions. -Placed on Tamiflu. cx  negative. Narrowed abx to levaquin. -Symptoms improving. Now transitioned to oral prednisone with taper over the next 12 days. Will prescribe Levaquin and Tamiflu to complete 5 day course.  Stable for discharge home. Continue as needed nebulizer at home.    Active Problems:  Hypokalemia Replenished  Decompensated hepatic cirrhosis. Has chronic pancytopenia. Elevated ammonia but with good mental status. Continue lactulose and zebeta.   Essential hypertension Continue Zebeta     CKD stage 3 due to type 2 diabetes mellitus ([place]) Mildly worsened function on admission. Now improved.  Chronic diastolic CHF Compensated at present. Continue aspirin and beta blocker. Resume lasix.    Anxiety and depression Continue home medications      Family Communication  : None at bedside  Disposition Plan  : Home   Consults  :  None  Procedures  : None   Discharge Instructions   Allergies as of 11/11/2016      Reactions   Atorvastatin Other (See Comments)   Doesn't remember   Diphenhydramine Hcl (sleep) Hives   Hydrocodone-acetaminophen Other (See Comments)   Doesn't remember   Lopid [gemfibrozil] Other (See Comments)   Doesn't remember   Loratadine Hives   Lorazepam Hives   Simvastatin Other (See Comments)   Unknown reaction   Sulfamethoxazole Hives   Sulfonamide Derivatives Hives      Medication List    STOP taking these medications   amoxicillin 500 MG capsule Commonly known as:  AMOXIL   amoxicillin-clavulanate 875-125 MG tablet Commonly known as:  AUGMENTIN   metolazone 5 MG tablet Commonly known as:  ZAROXOLYN     TAKE these medications   acetaminophen 500 MG tablet Commonly known as:  TYLENOL Take 500 mg by mouth every 6 (six) hours as needed for fever or headache (pain).   albuterol (2.5 MG/3ML) 0.083% nebulizer solution Commonly known as:  PROVENTIL Take 2.5 mg by nebulization every 4 (four) hours as needed for wheezing or  shortness of breath.   albuterol 108 (90 Base) MCG/ACT inhaler Commonly known as:  PROVENTIL HFA;VENTOLIN HFA Inhale 2 puffs into the lungs every 6 (six) hours as needed for wheezing or shortness of breath.   allopurinol 300 MG tablet Commonly known as:  ZYLOPRIM take 1 tablet by mouth daily for gout   allopurinol 300 MG tablet Commonly known as:  ZYLOPRIM take 1/2-1 tablet by mouth daily for gout   ALPRAZolam 0.5 MG tablet Commonly known as:  XANAX Take 0.5 mg by mouth daily as needed for anxiety or sleep.   aspirin EC 81 MG tablet Take 81 mg by mouth at bedtime.   bisoprolol 10 MG tablet Commonly known as:  ZEBETA Take 1 tablet (10 mg total) by mouth daily.   citalopram 40 MG tablet Commonly known as:  CELEXA Take 1 tablet (40 mg total) by mouth daily.   cyclobenzaprine 10 MG tablet Commonly known as:  FLEXERIL Take 1 tablet (10 mg total) by mouth 3 (three) times daily as needed for muscle spasms.   dextromethorphan-guaiFENesin 30-600 MG 12hr tablet Commonly known as:  MUCINEX DM Take 1 tablet by mouth 2 (two) times daily.   ferrous sulfate 325 (65 FE) MG tablet Take 325 mg by mouth daily.   furosemide 80 MG tablet Commonly known as:  LASIX Take 0.5-1 tablets (40-80 mg total) by mouth See admin instructions. Take 1 tablet (80 mg) by mouth every morning and 1/2 tablet (40 mg) after supper   gabapentin 300 MG capsule Commonly known as:  NEURONTIN Take 1 capsule (300 mg total) by mouth 3 (three) times daily.   ipratropium 0.02 % nebulizer solution Commonly known as:  ATROVENT Take 2.5 mLs (0.5 mg total) by nebulization every 4 (four) hours as needed for wheezing or shortness of breath.   lactulose 10 GM/15ML solution Commonly known as:  CHRONULAC Take 30 g by mouth daily as needed for mild constipation (to regulate ammonia levels).   levofloxacin 750 MG tablet Commonly known as:  LEVAQUIN Take 1 tablet (750 mg total) by mouth every other day.   Magnesium 250  MG Tabs Take 250 mg by mouth at bedtime.   meclizine 25 MG tablet Commonly known as:  ANTIVERT Take 1 tablet (25 mg total) by mouth 2 (two) times daily. What changed:  Another medication with the same name was removed. Continue taking this medication, and follow the directions you see here.   ondansetron 4 MG tablet Commonly known as:  ZOFRAN Take 1 tablet (4 mg total) by mouth every 6 (six) hours as needed for nausea or vomiting.   oseltamivir 30 MG capsule Commonly known as:  TAMIFLU Take 1 capsule (30 mg total) by mouth 2 (two) times daily.   OXYGEN Inhale 2 L into the lungs continuous.   pantoprazole 40 MG tablet Commonly known as:  PROTONIX Take 1 tablet (40 mg total) by mouth 2 (two) times daily as needed for heartburn   Potassium Chloride ER 20 MEQ Tbcr Take 40 mEq by mouth 2 (two) times daily. Take 3 tablets (30 meq) by mouth every morning and 2 tablets (20 meq) every evening and at bedtime What changed:  how much to take  when to take this  additional  instructions   predniSONE 20 MG tablet Commonly known as:  DELTASONE Take 1 tablet (20 mg total) by mouth daily with breakfast.   traMADol 50 MG tablet Commonly known as:  ULTRAM take 1 TABLET BY MOUTH EVERY 8 HOURS   triamcinolone cream 0.1 % Commonly known as:  KENALOG Apply 1 application topically 3 (three) times daily as needed for itching.      Follow-up Information    [facility] Follow up.   Specialty:  [place] Why:  They will continue to do your home health care at your home Contact information: [place] [address] [phone]        MCKEOWN,WILLIAM DAVID, MD. Schedule an appointment as soon as possible for a visit in 1 week(s).   Specialty:  Internal Medicine Contact information: [address] [place] [address] [phone]          Allergies  Allergen Reactions  . Atorvastatin Other (See Comments)    Doesn't  remember  . Diphenhydramine Hcl (Sleep) Hives  . Hydrocodone-Acetaminophen Other (See Comments)    Doesn't remember  . Lopid [Gemfibrozil] Other (See Comments)    Doesn't remember  . Loratadine Hives  . Lorazepam Hives  . Simvastatin Other (See Comments)    Unknown reaction  . Sulfamethoxazole Hives  . Sulfonamide Derivatives Hives     Procedures/Studies: Dg Chest 2 View  Result Date: 11/07/2016 CLINICAL DATA:  Shortness of breath with productive cough EXAM: CHEST  2 VIEW COMPARISON:  10/31/2016 FINDINGS: Status post right shoulder replacement. Partially visualized hardware in the cervical spine. Patchy infiltrate at the lingula and right base. No effusion. Stable cardiomediastinal silhouette with atherosclerosis. No pneumothorax. Surgical clips in the right upper quadrant. Post augmentation changes of L1. IMPRESSION: Patchy infiltrates within the lingula and right lung base. Electronically Signed   By: Donavan Foil M.D.   On: 11/07/2016 19:23   Dg Chest 2 View  Result Date: 10/31/2016 CLINICAL DATA:  Former smoker, recent pneumonia, history COPD, cirrhosis, hypertension EXAM: CHEST  2 VIEW COMPARISON:  10/12/2016 FINDINGS: Minimal enlargement of cardiac silhouette. Atherosclerotic calcification aorta. Mediastinal contours and pulmonary vascularity normal. Persistent subsegmental RIGHT middle lobe on lateral view. Lungs otherwise clear. No pleural effusion or pneumothorax. Bones demineralized with evidence of prior cervical spine fusion, RIGHT shoulder arthroplasty, and L1 vertebral augmentation. IMPRESSION: Persistent subsegmental atelectasis RIGHT middle lobe. Electronically Signed   By: Lavonia Dana M.D.   On: 10/31/2016 16:12   Dg Chest 2 View  Result Date: 10/12/2016 CLINICAL DATA:  Fever for 1 week.  Nausea. EXAM: CHEST  2 VIEW COMPARISON:  01/19/2016 FINDINGS: There is mild right lower lobe airspace disease which may reflect atelectasis versus pneumonia. There is no pleural effusion or  pneumothorax. The heart and mediastinal contours are unremarkable. The osseous structures are unremarkable. IMPRESSION: Mild right lower lobe airspace disease which may reflect atelectasis versus pneumonia. Followup PA and lateral chest X-ray is recommended in 3-4 weeks following trial of antibiotic therapy to ensure resolution and exclude underlying malignancy. Electronically Signed   By: Kathreen Devoid   On: 10/12/2016 14:59   US Abdomen Limited  Result Date: 10/12/2016 CLINICAL DATA:  Cirrhosis with paraesophageal varices and splenomegaly. Abdominal pain. EXAM: LIMITED ABDOMEN ULTRASOUND FOR ASCITES TECHNIQUE: Limited ultrasound survey for ascites was performed in all four abdominal quadrants. COMPARISON:  Abdominal CT 07/11/2016 FINDINGS: No varices are present. An extra renal collecting system is again seen in the right kidney. The spleen  is enlarged. IMPRESSION: 1. No abdominal ascites. 2. 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 3. Splenomegaly. Electronically Signed   By: San Morelle M.D.   On: 10/12/2016 20:21   Ct Maxillofacial Wo Contrast  Result Date: 10/12/2016 CLINICAL DATA:  71 y/o F; recent root canal with complaint of lower jaw soreness and sinus pain. EXAM: CT MAXILLOFACIAL WITHOUT CONTRAST TECHNIQUE: Multidetector CT imaging of the maxillofacial structures was performed. Multiplanar CT image reconstructions were also generated. A small metallic BB was placed on the right temple in order to reliably differentiate right from left. COMPARISON:  09/03/2014 CT of the head. FINDINGS: Osseous: No fracture or mandibular dislocation. No destructive process. Orbits: No traumatic or inflammatory finding. Bilateral intra-ocular lens replacement. Sinuses: Bilateral mastoid tip opacification and sclerosis compatible sequelae of chronic otomastoiditis. Mastoid air cells are otherwise normally aerated. Borderline high-riding right jugular bulb. Paranasal sinuses are normally aerated. Soft  tissues: Negative. Limited intracranial: No significant or unexpected finding. IMPRESSION: 1. No bony fracture or destructive process. Normal soft tissues. Normally aerated paranasal sinuses. 2. No finding as explanation for pain identified. Electronically Signed   By: Kristine Garbe M.D.   On: 10/12/2016 22:47    (Echo, Carotid, EGD, Colonoscopy, ERCP)    Subjective:   Discharge Exam: Vitals:   11/10/16 1920 11/11/16 0402  BP: 140/60 129/63  Pulse: 88 92  Resp: 17 (!) 29  Temp: 98 F (36.7 C) 98.7 F (37.1 C)   Vitals:   11/10/16 1526 11/10/16 1920 11/10/16 2007 11/11/16 0402  BP:  140/60  129/63  Pulse:  88  92  Resp:  17  (!) 29  Temp:  98 F (36.7 C)  98.7 F (37.1 C)  TempSrc:  Oral  Oral  SpO2: 99% 99% 97% 98%  Weight:    83.4 kg (183 lb 14.4 oz)  Height:        Gen: not in distress HEENT: , moist mucosa, supple neck Chest: scattered wheezing b/l (improved from yesterday) CVS: N S1&S2, no murmurs, GI: soft, NT, ND,  Musculoskeletal: warm, no edema     The results of significant diagnostics from this hospitalization (including imaging, microbiology, ancillary and laboratory) are listed below for reference.     Microbiology: Recent Results (from the past 240 hour(s))  Blood Culture (routine x 2)     Status: None (Preliminary result)   Collection Time: 11/07/16  8:35 PM  Result Value Ref Range Status   Specimen Description BLOOD RIGHT HAND  Final   Special Requests IN PEDIATRIC BOTTLE Blood Culture adequate volume  Final   Culture NO GROWTH 3 DAYS  Final   Report Status PENDING  Incomplete  Blood Culture (routine x 2)     Status: None (Preliminary result)   Collection Time: 11/07/16  8:43 PM  Result Value Ref Range Status   Specimen Description BLOOD LEFT ARM  Final   Special Requests   Final    BOTTLES DRAWN AEROBIC AND ANAEROBIC Blood Culture adequate volume   Culture NO GROWTH 3 DAYS  Final   Report Status PENDING  Incomplete  MRSA PCR  Screening     Status: None   Collection Time: 11/08/16  4:45 AM  Result Value Ref Range Status   MRSA by PCR NEGATIVE NEGATIVE Final    Comment:        The GeneXpert MRSA Assay (FDA approved for NASAL specimens only), is one component of a comprehensive MRSA colonization surveillance program. It is not intended to diagnose  MRSA infection nor to guide or monitor treatment for MRSA infections.      Labs: BNP (last 3 results)  Recent Labs  01/19/16 0820 11/08/16 0434  BNP 72.2 253.6*   Basic Metabolic Panel:  Recent Labs Lab 11/07/16 1823 11/10/16 0325 11/11/16 0156  NA 132* 132* 135  K 4.3 2.8* 3.8  CL 93* 95* 100*  CO2 31 26 28   GLUCOSE 132* 356* 264*  BUN 10 24* 24*  CREATININE 1.26* 1.25* 1.28*  CALCIUM 8.7* 9.0 8.9  MG  --  2.1  --    Liver Function Tests: No results for input(s): AST, ALT, ALKPHOS, BILITOT, PROT, ALBUMIN in the last 168 hours. No results for input(s): LIPASE, AMYLASE in the last 168 hours.  Recent Labs Lab 11/07/16 2019  AMMONIA 45*   CBC:  Recent Labs Lab 11/07/16 1823 11/07/16 2043 11/10/16 0325  WBC 3.6* 3.5* 5.0  NEUTROABS  --  2.3  --   HGB 10.9* 10.4* 9.8*  HCT 33.9* 33.5* 30.8*  MCV 104.0* 103.7* 100.0  PLT 70* 64* 63*   Cardiac Enzymes:  Recent Labs Lab 11/08/16 0000 11/08/16 0630 11/08/16 1148  TROPONINI <0.03 <0.03 <0.03   BNP: Invalid input(s): POCBNP CBG:  Recent Labs Lab 11/10/16 0740 11/10/16 1137 11/10/16 1604 11/10/16 2139  GLUCAP 305* 358* 333* 302*   D-Dimer No results for input(s): DDIMER in the last 72 hours. Hgb A1c No results for input(s): HGBA1C in the last 72 hours. Lipid Profile No results for input(s): CHOL, HDL, LDLCALC, TRIG, CHOLHDL, LDLDIRECT in the last 72 hours. Thyroid function studies No results for input(s): TSH, T4TOTAL, T3FREE, THYROIDAB in the last 72 hours.  Invalid input(s): FREET3 Anemia work up No results for input(s): VITAMINB12, FOLATE, FERRITIN, TIBC,  IRON, RETICCTPCT in the last 72 hours. Urinalysis    Component Value Date/Time   COLORURINE YELLOW 11/07/2016 2116   APPEARANCEUR CLEAR 11/07/2016 2116   LABSPEC 1.014 11/07/2016 2116   PHURINE 6.0 11/07/2016 2116   GLUCOSEU NEGATIVE 11/07/2016 2116   HGBUR SMALL (A) 11/07/2016 2116   BILIRUBINUR NEGATIVE 11/07/2016 2116   KETONESUR NEGATIVE 11/07/2016 2116   PROTEINUR NEGATIVE 11/07/2016 2116   UROBILINOGEN 1 01/13/2015 1145   NITRITE NEGATIVE 11/07/2016 2116   LEUKOCYTESUR NEGATIVE 11/07/2016 2116   Sepsis Labs Invalid input(s): PROCALCITONIN,  WBC,  LACTICIDVEN Microbiology Recent Results (from the past 240 hour(s))  Blood Culture (routine x 2)     Status: None (Preliminary result)   Collection Time: 11/07/16  8:35 PM  Result Value Ref Range Status   Specimen Description BLOOD RIGHT HAND  Final   Special Requests IN PEDIATRIC BOTTLE Blood Culture adequate volume  Final   Culture NO GROWTH 3 DAYS  Final   Report Status PENDING  Incomplete  Blood Culture (routine x 2)     Status: None (Preliminary result)   Collection Time: 11/07/16  8:43 PM  Result Value Ref Range Status   Specimen Description BLOOD LEFT ARM  Final   Special Requests   Final    BOTTLES DRAWN AEROBIC AND ANAEROBIC Blood Culture adequate volume   Culture NO GROWTH 3 DAYS  Final   Report Status PENDING  Incomplete  MRSA PCR Screening     Status: None   Collection Time: 11/08/16  4:45 AM  Result Value Ref Range Status   MRSA by PCR NEGATIVE NEGATIVE Final    Comment:        The GeneXpert MRSA Assay (FDA approved for NASAL specimens only), is  one component of a comprehensive MRSA colonization surveillance program. It is not intended to diagnose MRSA infection nor to guide or monitor treatment for MRSA infections.      Time coordinating discharge: Over 30 minutes  SIGNED:   Louellen Molder, MD  [facility] 11/11/2016, 8:10 AM Pager   If 7PM-7AM, please contact  night-coverage [email or web address] Password [email or web address]

## 2016-11-12 DIAGNOSIS — R261 Paralytic gait: Secondary | ICD-10-CM | POA: Diagnosis not present

## 2016-11-12 DIAGNOSIS — K7581 Nonalcoholic steatohepatitis (NASH): Secondary | ICD-10-CM | POA: Diagnosis not present

## 2016-11-12 DIAGNOSIS — I129 Hypertensive chronic kidney disease with stage 1 through stage 4 chronic kidney disease, or unspecified chronic kidney disease: Secondary | ICD-10-CM | POA: Diagnosis not present

## 2016-11-12 DIAGNOSIS — E1122 Type 2 diabetes mellitus with diabetic chronic kidney disease: Secondary | ICD-10-CM | POA: Diagnosis not present

## 2016-11-12 DIAGNOSIS — E1165 Type 2 diabetes mellitus with hyperglycemia: Secondary | ICD-10-CM | POA: Diagnosis not present

## 2016-11-12 DIAGNOSIS — N183 Chronic kidney disease, stage 3 (moderate): Secondary | ICD-10-CM | POA: Diagnosis not present

## 2016-11-12 DIAGNOSIS — M6281 Muscle weakness (generalized): Secondary | ICD-10-CM | POA: Diagnosis not present

## 2016-11-12 DIAGNOSIS — J449 Chronic obstructive pulmonary disease, unspecified: Secondary | ICD-10-CM | POA: Diagnosis not present

## 2016-11-12 DIAGNOSIS — M545 Low back pain: Secondary | ICD-10-CM | POA: Diagnosis not present

## 2016-11-12 DIAGNOSIS — J9611 Chronic respiratory failure with hypoxia: Secondary | ICD-10-CM | POA: Diagnosis not present

## 2016-11-13 LAB — CULTURE, BLOOD (ROUTINE X 2)
CULTURE: NO GROWTH
Culture: NO GROWTH
SPECIAL REQUESTS: ADEQUATE
Special Requests: ADEQUATE

## 2016-11-14 ENCOUNTER — Telehealth: Payer: Self-pay | Admitting: *Deleted

## 2016-11-14 ENCOUNTER — Other Ambulatory Visit: Payer: Self-pay

## 2016-11-14 NOTE — Patient Outreach (Addendum)
[facility]) Care Management  11/14/2016  [name] 1945-10-12 [id number]     EMMI-COPD RED ON EMMI ALERT Day #1 Date: 11/13/16 Red Alert Reason: " # of times rescue inhaler used in past 24 hrs? 3"   Outreach attempt #1 to patient. Spoke with patient. She states she is doing fairly well and feels better since discharge home. Reviewed and addressed red alert. Patient states she does not take or use any inhalers. She voices that she is only using oxygen 24/7. She denies any SOB and/or worsening resp symptoms. She states that her breathing has been better along with her coughing. She is aware of s/s of worsening condition and when to seek medical attention. She has all of her meds. States spouse is very supportive in helping her manage her care. He has made PCP f/u appt for patient and will be taking her to appt. Patient denies any RN CM needs or concerns at this time.Advised patient that they would continue to get automated EMMI COPD post discharge calls and will receive a call from a nurse if any of their responses were abnormal. She voiced understanding and was appreciative of f/u call.     Plan: RN CM will notify [facility] administrative assistant of case status.   Enzo Montgomery, RN,BSN,CCM [place] [facility] Telephonic Care Management Coordinator Direct Phone: [phone] Toll Free: [phone] Fax: [phone]

## 2016-11-14 NOTE — Telephone Encounter (Signed)
Per Dr Melford Aase, It is OK to continue PT 1 time a week for 7 weeks.  Physical therapist aware.

## 2016-11-15 ENCOUNTER — Encounter: Payer: Self-pay | Admitting: Internal Medicine

## 2016-11-15 ENCOUNTER — Ambulatory Visit (INDEPENDENT_AMBULATORY_CARE_PROVIDER_SITE_OTHER): Payer: PPO | Admitting: Internal Medicine

## 2016-11-15 VITALS — BP 134/72 | HR 78 | Temp 100.8°F | Resp 16

## 2016-11-15 DIAGNOSIS — J9612 Chronic respiratory failure with hypercapnia: Secondary | ICD-10-CM

## 2016-11-15 DIAGNOSIS — J9611 Chronic respiratory failure with hypoxia: Secondary | ICD-10-CM

## 2016-11-15 DIAGNOSIS — J101 Influenza due to other identified influenza virus with other respiratory manifestations: Secondary | ICD-10-CM | POA: Diagnosis not present

## 2016-11-15 DIAGNOSIS — J189 Pneumonia, unspecified organism: Secondary | ICD-10-CM | POA: Diagnosis not present

## 2016-11-15 DIAGNOSIS — R5081 Fever presenting with conditions classified elsewhere: Secondary | ICD-10-CM

## 2016-11-15 DIAGNOSIS — R5383 Other fatigue: Secondary | ICD-10-CM

## 2016-11-15 MED ORDER — AZITHROMYCIN 250 MG PO TABS: ORAL_TABLET | ORAL | 0 refills | Status: DC

## 2016-11-15 NOTE — Progress Notes (Signed)
Assessment and Plan:  Patient presents for hospital follow-up for HCAP and superimposed influenza B.  She finished levaquin and tamiflu.  She is febrile here today on exam.  She is improving though.  She has home health and physical therapy.  She is still taking mucinex BID.  She can continue to take 2.5-5 mL of phenergan dm as needed for coughing.  Continue QID nebulizer treatments.  Will put on zpak for better atypical bacterial coverage.  Continue tylenol every 8 hours as needed for fever.  Will need repeat CXR in 4 weeks.  She already has this appointment with Dr. Melford Aase set on 12/04/16.  She will call office if worsening SOB, continued fever, or any other concerning symptoms.     Over 40 minutes of exam, counseling, chart review, and complex, high/moderate level critical decision making was performed this visit.   HPI 71 y.o.female presents for follow up from the hospital. Admit date to the hospital was 11/07/16, patient was discharged from the hospital on 11/11/16 and our office contacted the office the day after discharge to set up a follow up appointment, patient was admitted for: influenza with HCAP on top of that.  She reports that she is still coughing and is feeling really weak. She reports that she is sleeping all night and is doing well.  She is having some mild sore throat.  She has tried some of her husbands viscous lidocaine and has also taken some phenergan dm.  She has not been having fevers at home.  She has home health and physical therapy at the house.  She is back on her normal oxygen of 2L daily.  She finished both the tamiflu and also the antibiotics.  She did not have diarrhea this time.  She is still coughing up some phlegm.  She is still on prednisone.  She is on a single tablet of it currently.  She is walking with the therapist and keeping her oxygen level up.  She went up a little bit on her weight by 2 lbs today so she did have a fluid pill.    Images while in the hospital: Dg  Chest 2 View  Result Date: 11/07/2016 CLINICAL DATA:  Shortness of breath with productive cough EXAM: CHEST  2 VIEW COMPARISON:  10/31/2016 FINDINGS: Status post right shoulder replacement. Partially visualized hardware in the cervical spine. Patchy infiltrate at the lingula and right base. No effusion. Stable cardiomediastinal silhouette with atherosclerosis. No pneumothorax. Surgical clips in the right upper quadrant. Post augmentation changes of L1. IMPRESSION: Patchy infiltrates within the lingula and right lung base. Electronically Signed   By: Donavan Foil M.D.   On: 11/07/2016 19:23    Past Medical History:  Diagnosis Date  . Asthma   . Esophageal varices ([place])   . Fibromyalgia   . Gastritis   . Hepatic cirrhosis ([place])   . Hepatic encephalopathy ([place])   . Hyperlipidemia   . Hypertension   . IBS (irritable bowel syndrome)   . Splenomegaly   . Vitamin D deficiency      Allergies  Allergen Reactions  . Atorvastatin Other (See Comments)    Doesn't remember  . Diphenhydramine Hcl (Sleep) Hives  . Hydrocodone-Acetaminophen Other (See Comments)    Doesn't remember  . Lopid [Gemfibrozil] Other (See Comments)    Doesn't remember  . Loratadine Hives  . Lorazepam Hives  . Simvastatin Other (See Comments)    Unknown reaction  . Sulfamethoxazole Hives  . Sulfonamide Derivatives Hives  Current Outpatient Prescriptions on File Prior to Visit  Medication Sig Dispense Refill  . acetaminophen (TYLENOL) 500 MG tablet Take 500 mg by mouth every 6 (six) hours as needed for fever or headache (pain).    Marland Kitchen albuterol (PROVENTIL HFA;VENTOLIN HFA) 108 (90 Base) MCG/ACT inhaler Inhale 2 puffs into the lungs every 6 (six) hours as needed for wheezing or shortness of breath. 1 Inhaler 2  . albuterol (PROVENTIL) (2.5 MG/3ML) 0.083% nebulizer solution Take 2.5 mg by nebulization every 4 (four) hours as needed for wheezing or shortness of breath.     . allopurinol (ZYLOPRIM) 300 MG tablet take 1  tablet by mouth daily for gout 90 tablet 1  . ALPRAZolam (XANAX) 0.5 MG tablet Take 0.5 mg by mouth daily as needed for anxiety or sleep.    Marland Kitchen aspirin EC 81 MG tablet Take 81 mg by mouth at bedtime.    . bisoprolol (ZEBETA) 10 MG tablet Take 1 tablet (10 mg total) by mouth daily. 30 tablet 0  . citalopram (CELEXA) 40 MG tablet Take 1 tablet (40 mg total) by mouth daily.    . cyclobenzaprine (FLEXERIL) 10 MG tablet Take 1 tablet (10 mg total) by mouth 3 (three) times daily as needed for muscle spasms. 90 tablet 2  . dextromethorphan-guaiFENesin (MUCINEX DM) 30-600 MG 12hr tablet Take 1 tablet by mouth 2 (two) times daily. 10 tablet 0  . ferrous sulfate 325 (65 FE) MG tablet Take 325 mg by mouth daily.     . furosemide (LASIX) 80 MG tablet Take 0.5-1 tablets (40-80 mg total) by mouth See admin instructions. Take 1 tablet (80 mg) by mouth every morning and 1/2 tablet (40 mg) after supper    . gabapentin (NEURONTIN) 300 MG capsule Take 1 capsule (300 mg total) by mouth 3 (three) times daily.    Marland Kitchen ipratropium (ATROVENT) 0.02 % nebulizer solution Take 2.5 mLs (0.5 mg total) by nebulization every 4 (four) hours as needed for wheezing or shortness of breath.    . lactulose (CHRONULAC) 10 GM/15ML solution Take 30 g by mouth daily as needed for mild constipation (to regulate ammonia levels).    . Magnesium 250 MG TABS Take 250 mg by mouth at bedtime.     . meclizine (ANTIVERT) 25 MG tablet Take 1 tablet (25 mg total) by mouth 2 (two) times daily.    . ondansetron (ZOFRAN) 4 MG tablet Take 1 tablet (4 mg total) by mouth every 6 (six) hours as needed for nausea or vomiting. 30 tablet 1  . OXYGEN Inhale 2 L into the lungs continuous.     . pantoprazole (PROTONIX) 40 MG tablet Take 1 tablet (40 mg total) by mouth 2 (two) times daily as needed for heartburn 180 tablet 1  . potassium chloride 20 MEQ TBCR Take 40 mEq by mouth 2 (two) times daily. Take 3 tablets (30 meq) by mouth every morning and 2 tablets (20 meq)  every evening and at bedtime (Patient taking differently: Take 40-60 mEq by mouth See admin instructions. Take 3 tablets by mouth every morning then 2 tablets every evening and at bedtime)    . predniSONE (DELTASONE) 20 MG tablet Take 1 tablet (20 mg total) by mouth daily with breakfast. 16 tablet 0  . traMADol (ULTRAM) 50 MG tablet take 1 TABLET BY MOUTH EVERY 8 HOURS 90 tablet 1  . triamcinolone cream (KENALOG) 0.1 % Apply 1 application topically 3 (three) times daily as needed for itching.  0   No  current facility-administered medications on file prior to visit.     Review of Systems  Constitutional: Positive for fever and malaise/fatigue. Negative for chills and diaphoresis.  HENT: Negative for congestion, hearing loss and sore throat.   Respiratory: Positive for cough, sputum production and wheezing. Negative for hemoptysis and shortness of breath.   Cardiovascular: Positive for leg swelling. Negative for chest pain, palpitations, orthopnea, claudication and PND.  Gastrointestinal: Negative.   Genitourinary: Negative.   Musculoskeletal: Negative.   Neurological: Positive for weakness. Negative for dizziness, tremors and loss of consciousness.     Physical Exam: There were no vitals filed for this visit. BP 134/72   Pulse 78   Temp (!) 100.8 F (38.2 C) (Temporal)   Resp 16   SpO2 92%  General Appearance: Chronically ill appearing WFM, wheelchair bound, on [place] O2 pulse oxygen, Well nourished, in no apparent distress. Eyes: PERRLA, EOMs, conjunctiva no swelling or erythema Sinuses: No Frontal/maxillary tenderness ENT/Mouth: Ext aud canals clear, TMs without erythema, bulging. No erythema, swelling, or exudate on post pharynx.  Tonsils not swollen or erythematous. Hearing normal.  Neck: Supple, thyroid normal.  Respiratory: Respiratory effort minimally increased, BS equal bilaterally with wheezes to bilateral lungs.  Heard best at the bases. Cardio: RRR with no MRGs. 1+ peripheral  pulses without edema.  Abdomen: Soft, + BS.  Non tender, no guarding, rebound, hernias, masses. Lymphatics: Non tender without lymphadenopathy.  Musculoskeletal: Full ROM, 5/5 strength, normal gait.  Skin: Bruising to bilateral forearms and hands, Warm, dry without rashes, lesions, ecchymosis.  Neuro: Cranial nerves intact. Normal muscle tone, no cerebellar symptoms. Sensation intact.  Psych: Awake and oriented X 3, normal affect, Insight and Judgment appropriate.     Starlyn Skeans, PA-C 2:31 PM [facility]

## 2016-11-17 DIAGNOSIS — J449 Chronic obstructive pulmonary disease, unspecified: Secondary | ICD-10-CM | POA: Diagnosis not present

## 2016-11-19 ENCOUNTER — Other Ambulatory Visit: Payer: Self-pay | Admitting: Internal Medicine

## 2016-11-21 DIAGNOSIS — J9611 Chronic respiratory failure with hypoxia: Secondary | ICD-10-CM | POA: Diagnosis not present

## 2016-11-21 DIAGNOSIS — E1122 Type 2 diabetes mellitus with diabetic chronic kidney disease: Secondary | ICD-10-CM | POA: Diagnosis not present

## 2016-11-21 DIAGNOSIS — Z7982 Long term (current) use of aspirin: Secondary | ICD-10-CM | POA: Diagnosis not present

## 2016-11-21 DIAGNOSIS — J440 Chronic obstructive pulmonary disease with acute lower respiratory infection: Secondary | ICD-10-CM | POA: Diagnosis not present

## 2016-11-21 DIAGNOSIS — I129 Hypertensive chronic kidney disease with stage 1 through stage 4 chronic kidney disease, or unspecified chronic kidney disease: Secondary | ICD-10-CM | POA: Diagnosis not present

## 2016-11-21 DIAGNOSIS — N183 Chronic kidney disease, stage 3 (moderate): Secondary | ICD-10-CM | POA: Diagnosis not present

## 2016-11-21 DIAGNOSIS — M6281 Muscle weakness (generalized): Secondary | ICD-10-CM | POA: Diagnosis not present

## 2016-11-21 DIAGNOSIS — Z87891 Personal history of nicotine dependence: Secondary | ICD-10-CM | POA: Diagnosis not present

## 2016-11-21 DIAGNOSIS — K7581 Nonalcoholic steatohepatitis (NASH): Secondary | ICD-10-CM | POA: Diagnosis not present

## 2016-11-21 DIAGNOSIS — M545 Low back pain: Secondary | ICD-10-CM | POA: Diagnosis not present

## 2016-11-28 ENCOUNTER — Ambulatory Visit: Payer: Self-pay | Admitting: Internal Medicine

## 2016-11-30 ENCOUNTER — Observation Stay (HOSPITAL_COMMUNITY)
Admission: EM | Admit: 2016-11-30 | Discharge: 2016-12-02 | Disposition: A | Discharge status: Home / Self Care | Payer: PPO | Attending: Family Medicine | Admitting: Family Medicine

## 2016-11-30 ENCOUNTER — Telehealth: Payer: Self-pay | Admitting: *Deleted

## 2016-11-30 ENCOUNTER — Encounter (HOSPITAL_COMMUNITY): Payer: Self-pay | Admitting: *Deleted

## 2016-11-30 ENCOUNTER — Emergency Department (HOSPITAL_COMMUNITY): Payer: PPO

## 2016-11-30 ENCOUNTER — Ambulatory Visit (HOSPITAL_COMMUNITY)
Admission: EM | Admit: 2016-11-30 | Discharge: 2016-11-30 | Disposition: A | Discharge status: Nursing Home (Includes ICF) | Payer: PPO | Source: Home / Self Care

## 2016-11-30 DIAGNOSIS — A419 Sepsis, unspecified organism: Secondary | ICD-10-CM | POA: Diagnosis not present

## 2016-11-30 DIAGNOSIS — R509 Fever, unspecified: Secondary | ICD-10-CM | POA: Diagnosis not present

## 2016-11-30 DIAGNOSIS — D485 Neoplasm of uncertain behavior of skin: Secondary | ICD-10-CM | POA: Diagnosis not present

## 2016-11-30 DIAGNOSIS — I129 Hypertensive chronic kidney disease with stage 1 through stage 4 chronic kidney disease, or unspecified chronic kidney disease: Secondary | ICD-10-CM | POA: Insufficient documentation

## 2016-11-30 DIAGNOSIS — D696 Thrombocytopenia, unspecified: Secondary | ICD-10-CM | POA: Diagnosis present

## 2016-11-30 DIAGNOSIS — K7460 Unspecified cirrhosis of liver: Secondary | ICD-10-CM | POA: Diagnosis present

## 2016-11-30 DIAGNOSIS — N183 Chronic kidney disease, stage 3 unspecified: Secondary | ICD-10-CM | POA: Diagnosis present

## 2016-11-30 DIAGNOSIS — J189 Pneumonia, unspecified organism: Secondary | ICD-10-CM | POA: Insufficient documentation

## 2016-11-30 DIAGNOSIS — E1122 Type 2 diabetes mellitus with diabetic chronic kidney disease: Secondary | ICD-10-CM | POA: Insufficient documentation

## 2016-11-30 DIAGNOSIS — R188 Other ascites: Secondary | ICD-10-CM

## 2016-11-30 DIAGNOSIS — E876 Hypokalemia: Secondary | ICD-10-CM | POA: Diagnosis present

## 2016-11-30 DIAGNOSIS — Z79899 Other long term (current) drug therapy: Secondary | ICD-10-CM | POA: Diagnosis not present

## 2016-11-30 DIAGNOSIS — J449 Chronic obstructive pulmonary disease, unspecified: Secondary | ICD-10-CM | POA: Insufficient documentation

## 2016-11-30 DIAGNOSIS — L820 Inflamed seborrheic keratosis: Secondary | ICD-10-CM | POA: Diagnosis not present

## 2016-11-30 DIAGNOSIS — I10 Essential (primary) hypertension: Secondary | ICD-10-CM | POA: Diagnosis not present

## 2016-11-30 DIAGNOSIS — R0602 Shortness of breath: Secondary | ICD-10-CM | POA: Diagnosis not present

## 2016-11-30 DIAGNOSIS — Z87891 Personal history of nicotine dependence: Secondary | ICD-10-CM | POA: Insufficient documentation

## 2016-11-30 DIAGNOSIS — Z7982 Long term (current) use of aspirin: Secondary | ICD-10-CM | POA: Insufficient documentation

## 2016-11-30 DIAGNOSIS — R05 Cough: Secondary | ICD-10-CM | POA: Diagnosis not present

## 2016-11-30 DIAGNOSIS — D649 Anemia, unspecified: Secondary | ICD-10-CM

## 2016-11-30 DIAGNOSIS — E872 Acidosis, unspecified: Secondary | ICD-10-CM

## 2016-11-30 DIAGNOSIS — M797 Fibromyalgia: Secondary | ICD-10-CM | POA: Diagnosis not present

## 2016-11-30 DIAGNOSIS — L821 Other seborrheic keratosis: Secondary | ICD-10-CM | POA: Diagnosis not present

## 2016-11-30 LAB — COMPREHENSIVE METABOLIC PANEL
ALT: 37 U/L (ref 14–54)
AST: 41 U/L (ref 15–41)
Albumin: 3 g/dL — ABNORMAL LOW (ref 3.5–5.0)
Alkaline Phosphatase: 148 U/L — ABNORMAL HIGH (ref 38–126)
Anion gap: 11 (ref 5–15)
BUN: 11 mg/dL (ref 6–20)
CO2: 35 mmol/L — ABNORMAL HIGH (ref 22–32)
Calcium: 9.4 mg/dL (ref 8.9–10.3)
Chloride: 88 mmol/L — ABNORMAL LOW (ref 101–111)
Creatinine, Ser: 1.32 mg/dL — ABNORMAL HIGH (ref 0.44–1.00)
GFR calc Af Amer: 46 mL/min — ABNORMAL LOW (ref >60)
GFR calc non Af Amer: 40 mL/min — ABNORMAL LOW (ref >60)
Glucose, Bld: 231 mg/dL — ABNORMAL HIGH (ref 65–99)
Potassium: 2.6 mmol/L — CL (ref 3.5–5.1)
Sodium: 134 mmol/L — ABNORMAL LOW (ref 135–145)
Total Bilirubin: 1.4 mg/dL — ABNORMAL HIGH (ref 0.3–1.2)
Total Protein: 5.4 g/dL — ABNORMAL LOW (ref 6.5–8.1)

## 2016-11-30 LAB — CBC WITH DIFFERENTIAL/PLATELET
Basophils Absolute: 0 10*3/uL (ref 0.0–0.1)
Basophils Relative: 0 %
Eosinophils Absolute: 0.2 10*3/uL (ref 0.0–0.7)
Eosinophils Relative: 3 %
HCT: 34 % — ABNORMAL LOW (ref 36.0–46.0)
Hemoglobin: 10.8 g/dL — ABNORMAL LOW (ref 12.0–15.0)
Lymphocytes Relative: 23 %
Lymphs Abs: 1.1 10*3/uL (ref 0.7–4.0)
MCH: 31.9 pg (ref 26.0–34.0)
MCHC: 31.8 g/dL (ref 30.0–36.0)
MCV: 100.3 fL — ABNORMAL HIGH (ref 78.0–100.0)
Monocytes Absolute: 0.3 10*3/uL (ref 0.1–1.0)
Monocytes Relative: 7 %
Neutro Abs: 3 10*3/uL (ref 1.7–7.7)
Neutrophils Relative %: 66 %
Platelets: 99 10*3/uL — ABNORMAL LOW (ref 150–400)
RBC: 3.39 MIL/uL — ABNORMAL LOW (ref 3.87–5.11)
RDW: 14.8 % (ref 11.5–15.5)
WBC: 4.6 10*3/uL (ref 4.0–10.5)

## 2016-11-30 LAB — I-STAT TROPONIN, ED: Troponin i, poc: 0 ng/mL (ref 0.00–0.08)

## 2016-11-30 LAB — PROCALCITONIN: Procalcitonin: 0.11 ng/mL

## 2016-11-30 LAB — URINALYSIS, ROUTINE W REFLEX MICROSCOPIC
Bilirubin Urine: NEGATIVE
Glucose, UA: NEGATIVE mg/dL
Hgb urine dipstick: NEGATIVE
Ketones, ur: NEGATIVE mg/dL
Leukocytes, UA: NEGATIVE
Nitrite: NEGATIVE
Protein, ur: NEGATIVE mg/dL
Specific Gravity, Urine: 1.004 — ABNORMAL LOW (ref 1.005–1.030)
pH: 7 (ref 5.0–8.0)

## 2016-11-30 LAB — AMMONIA: Ammonia: 55 umol/L — ABNORMAL HIGH (ref 9–35)

## 2016-11-30 LAB — BRAIN NATRIURETIC PEPTIDE: B Natriuretic Peptide: 144.7 pg/mL — ABNORMAL HIGH (ref 0.0–100.0)

## 2016-11-30 LAB — PROTIME-INR
INR: 1.06
Prothrombin Time: 13.8 seconds (ref 11.4–15.2)

## 2016-11-30 LAB — I-STAT CG4 LACTIC ACID, ED
Lactic Acid, Venous: 2.6 mmol/L (ref 0.5–1.9)
Lactic Acid, Venous: 2.15 mmol/L (ref 0.5–1.9)

## 2016-11-30 LAB — GLUCOSE, CAPILLARY: GLUCOSE-CAPILLARY: 184 mg/dL — AB (ref 65–99)

## 2016-11-30 LAB — C-REACTIVE PROTEIN: CRP: 6.2 mg/dL — AB (ref <1.0)

## 2016-11-30 LAB — MAGNESIUM: Magnesium: 1.9 mg/dL (ref 1.7–2.4)

## 2016-11-30 MED ORDER — DEXTROSE 5 % IV SOLN
1.0000 g | Freq: Once | INTRAVENOUS | Status: AC
  Administered 2016-11-30: 1 g via INTRAVENOUS
  Filled 2016-11-30: qty 1

## 2016-11-30 MED ORDER — ONDANSETRON HCL 4 MG/2ML IJ SOLN: 4.0000 mg | Freq: Four times a day (QID) | INTRAMUSCULAR | Status: DC | PRN

## 2016-11-30 MED ORDER — BISOPROLOL FUMARATE 5 MG PO TABS
10.0000 mg | ORAL_TABLET | Freq: Every day | ORAL | Status: DC
  Administered 2016-12-01 – 2016-12-02 (×2): 10 mg via ORAL
  Filled 2016-11-30 (×2): qty 2

## 2016-11-30 MED ORDER — POTASSIUM CHLORIDE CRYS ER 20 MEQ PO TBCR
40.0000 meq | EXTENDED_RELEASE_TABLET | Freq: Once | ORAL | Status: AC
  Administered 2016-11-30: 40 meq via ORAL
  Filled 2016-11-30: qty 2

## 2016-11-30 MED ORDER — ACETAMINOPHEN 650 MG RE SUPP: 650.0000 mg | Freq: Four times a day (QID) | RECTAL | Status: DC | PRN

## 2016-11-30 MED ORDER — DEXTROSE 5 % IV SOLN: 1.0000 g | Freq: Two times a day (BID) | INTRAVENOUS | Status: DC

## 2016-11-30 MED ORDER — POTASSIUM CHLORIDE CRYS ER 10 MEQ PO TBCR
30.0000 meq | EXTENDED_RELEASE_TABLET | Freq: Two times a day (BID) | ORAL | Status: DC
  Administered 2016-12-01 – 2016-12-02 (×2): 30 meq via ORAL
  Filled 2016-11-30 (×4): qty 3

## 2016-11-30 MED ORDER — ALBUTEROL SULFATE (2.5 MG/3ML) 0.083% IN NEBU
2.5000 mg | INHALATION_SOLUTION | RESPIRATORY_TRACT | Status: DC | PRN
  Administered 2016-12-01: 2.5 mg via RESPIRATORY_TRACT
  Filled 2016-11-30: qty 3

## 2016-11-30 MED ORDER — VANCOMYCIN HCL 10 G IV SOLR
1500.0000 mg | Freq: Once | INTRAVENOUS | Status: AC
  Administered 2016-11-30: 1500 mg via INTRAVENOUS
  Filled 2016-11-30: qty 1500

## 2016-11-30 MED ORDER — ACETAMINOPHEN 325 MG PO TABS
650.0000 mg | ORAL_TABLET | Freq: Four times a day (QID) | ORAL | Status: DC | PRN
  Administered 2016-12-01: 650 mg via ORAL
  Filled 2016-11-30: qty 2

## 2016-11-30 MED ORDER — CITALOPRAM HYDROBROMIDE 40 MG PO TABS
40.0000 mg | ORAL_TABLET | Freq: Every day | ORAL | Status: DC
  Administered 2016-12-01 – 2016-12-02 (×2): 40 mg via ORAL
  Filled 2016-11-30 (×2): qty 1

## 2016-11-30 MED ORDER — FERROUS SULFATE 325 (65 FE) MG PO TABS
325.0000 mg | ORAL_TABLET | Freq: Every day | ORAL | Status: DC
  Administered 2016-12-01 – 2016-12-02 (×2): 325 mg via ORAL
  Filled 2016-11-30 (×2): qty 1

## 2016-11-30 MED ORDER — ASPIRIN EC 81 MG PO TBEC
81.0000 mg | DELAYED_RELEASE_TABLET | Freq: Every day | ORAL | Status: DC
  Administered 2016-11-30 – 2016-12-01 (×2): 81 mg via ORAL
  Filled 2016-11-30 (×2): qty 1

## 2016-11-30 MED ORDER — INSULIN ASPART 100 UNIT/ML ~~LOC~~ SOLN
0.0000 [IU] | Freq: Every day | SUBCUTANEOUS | Status: DC
  Administered 2016-12-01: 4 [IU] via SUBCUTANEOUS

## 2016-11-30 MED ORDER — FUROSEMIDE 10 MG/ML IJ SOLN
40.0000 mg | Freq: Once | INTRAMUSCULAR | Status: AC
  Administered 2016-11-30: 40 mg via INTRAVENOUS
  Filled 2016-11-30: qty 4

## 2016-11-30 MED ORDER — GABAPENTIN 300 MG PO CAPS
300.0000 mg | ORAL_CAPSULE | Freq: Three times a day (TID) | ORAL | Status: DC
  Administered 2016-11-30 – 2016-12-02 (×5): 300 mg via ORAL
  Filled 2016-11-30 (×5): qty 1

## 2016-11-30 MED ORDER — INSULIN ASPART 100 UNIT/ML ~~LOC~~ SOLN
0.0000 [IU] | Freq: Three times a day (TID) | SUBCUTANEOUS | Status: DC
  Administered 2016-12-01 (×2): 11 [IU] via SUBCUTANEOUS
  Administered 2016-12-01: 15 [IU] via SUBCUTANEOUS
  Administered 2016-12-02: 4 [IU] via SUBCUTANEOUS
  Administered 2016-12-02: 3 [IU] via SUBCUTANEOUS

## 2016-11-30 MED ORDER — IPRATROPIUM-ALBUTEROL 0.5-2.5 (3) MG/3ML IN SOLN
3.0000 mL | RESPIRATORY_TRACT | Status: AC
  Administered 2016-11-30 (×3): 3 mL via RESPIRATORY_TRACT
  Filled 2016-11-30 (×2): qty 3

## 2016-11-30 MED ORDER — LACTULOSE 10 GM/15ML PO SOLN
30.0000 g | Freq: Every day | ORAL | Status: DC
  Administered 2016-12-01 – 2016-12-02 (×2): 30 g via ORAL
  Filled 2016-11-30 (×3): qty 45

## 2016-11-30 MED ORDER — PANTOPRAZOLE SODIUM 40 MG PO TBEC
40.0000 mg | DELAYED_RELEASE_TABLET | Freq: Every day | ORAL | Status: DC
  Administered 2016-12-01 – 2016-12-02 (×2): 40 mg via ORAL
  Filled 2016-11-30 (×2): qty 1

## 2016-11-30 MED ORDER — ALLOPURINOL 300 MG PO TABS
300.0000 mg | ORAL_TABLET | Freq: Every day | ORAL | Status: DC
  Administered 2016-12-01 – 2016-12-02 (×2): 300 mg via ORAL
  Filled 2016-11-30 (×2): qty 1

## 2016-11-30 MED ORDER — MAGNESIUM 200 MG PO TABS
250.0000 mg | ORAL_TABLET | Freq: Every day | ORAL | Status: DC
  Filled 2016-11-30: qty 2

## 2016-11-30 MED ORDER — SODIUM CHLORIDE 0.9 % IV BOLUS (SEPSIS)
1000.0000 mL | Freq: Once | INTRAVENOUS | Status: AC
Start: 2016-11-30 — End: 2016-11-30
  Administered 2016-11-30: 1000 mL via INTRAVENOUS

## 2016-11-30 MED ORDER — ONDANSETRON HCL 4 MG PO TABS: 4.0000 mg | ORAL_TABLET | Freq: Four times a day (QID) | ORAL | Status: DC | PRN

## 2016-11-30 MED ORDER — FUROSEMIDE 40 MG PO TABS
40.0000 mg | ORAL_TABLET | Freq: Two times a day (BID) | ORAL | Status: DC
Start: 2016-12-01 — End: 2016-12-02
  Administered 2016-12-01 – 2016-12-02 (×3): 40 mg via ORAL
  Filled 2016-11-30 (×3): qty 1

## 2016-11-30 MED ORDER — ENOXAPARIN SODIUM 40 MG/0.4ML ~~LOC~~ SOLN
40.0000 mg | SUBCUTANEOUS | Status: DC
  Administered 2016-11-30: 40 mg via SUBCUTANEOUS
  Filled 2016-11-30: qty 0.4

## 2016-11-30 MED ORDER — VANCOMYCIN HCL 10 G IV SOLR: 1250.0000 mg | INTRAVENOUS | Status: DC

## 2016-11-30 MED ORDER — ALPRAZOLAM 0.5 MG PO TABS
0.5000 mg | ORAL_TABLET | Freq: Every day | ORAL | Status: DC | PRN
  Administered 2016-12-01: 0.5 mg via ORAL
  Filled 2016-11-30: qty 1

## 2016-11-30 MED ORDER — TRAMADOL HCL 50 MG PO TABS
50.0000 mg | ORAL_TABLET | Freq: Two times a day (BID) | ORAL | Status: DC | PRN
Start: 2016-11-30 — End: 2016-12-02
  Administered 2016-12-01: 50 mg via ORAL
  Filled 2016-11-30: qty 1

## 2016-11-30 MED ORDER — MAGNESIUM SULFATE 2 GM/50ML IV SOLN
2.0000 g | Freq: Once | INTRAVENOUS | Status: AC
  Administered 2016-11-30: 2 g via INTRAVENOUS
  Filled 2016-11-30: qty 50

## 2016-11-30 NOTE — ED Provider Notes (Signed)
[facility] DEPT Provider Note   CSN: [id number] Arrival date & time: 11/30/16  1627     History   Chief Complaint Chief Complaint  Patient presents with  . Fever    HPI [name] is a 71 y.o. female.  The history is provided by the patient.  Fever   This is a recurrent problem. The current episode started more than 1 week ago. The problem occurs daily. The problem has not changed since onset.The maximum temperature noted was 101 to 101.9 F. The temperature was taken using an oral thermometer. Associated symptoms include muscle aches and cough. Pertinent negatives include no chest pain, no fussiness, no sleepiness, no diarrhea, no vomiting, no congestion, no headaches, no sore throat and no tugging at ear. She has tried nothing for the symptoms. The treatment provided no relief.    Past Medical History:  Diagnosis Date  . Asthma   . Esophageal varices ([place])   . Fibromyalgia   . Gastritis   . Hepatic cirrhosis ([place])   . Hepatic encephalopathy ([place])   . Hyperlipidemia   . Hypertension   . IBS (irritable bowel syndrome)   . Splenomegaly   . Vitamin D deficiency     Patient Active Problem List   Diagnosis Date Noted  . Influenza B 11/08/2016  . COPD exacerbation ([place])   . HCAP (healthcare-associated pneumonia) 11/07/2016  . Obesity (BMI 30-39.9) 10/12/2016  . Hyperammonemia ([place]) 10/12/2016  . Atrial tachycardia, multifocal ([place])   . QT prolongation 03/05/2016  . Acute renal failure superimposed on stage 3 chronic kidney disease ([place])   . Hyponatremia   . Acute on chronic respiratory failure ([place]) 01/19/2016  . DM type 2 causing CKD stage 3 ([place]) 10/30/2015  . Hepatic encephalopathy ([place]) 09/21/2015  . Hypokalemia 09/21/2015  . COPD with acute exacerbation ([place]) 08/30/2015  . CKD stage 3 due to type 2 diabetes mellitus ([place]) 08/16/2015  . Acute on chronic respiratory failure with hypoxia ([place]) 08/07/2015  . Thrombocytopenia ([place]) 08/07/2015  . Venous  insufficiency 07/26/2015  . Encounter for Medicare annual wellness exam 02/16/2015  . Medication management 12/14/2013  . Essential hypertension   . Hyperlipidemia   . GERD   . Vitamin D deficiency   . IBS   . Fibromyalgia   . Esophageal varices ([place]) 07/08/2012  . COLONIC POLYPS 11/11/2008  . Anemia 05/28/2008  . Hepatic cirrhosis ([place]) 05/28/2008  . Esophagitis 05/27/2008  . Diverticulosis of large intestine 05/27/2008    Past Surgical History:  Procedure Laterality Date  . ABDOMINAL HYSTERECTOMY    . BACK SURGERY    . CHOLECYSTECTOMY    . ESOPHAGOGASTRODUODENOSCOPY  07/16/2012   Procedure: ESOPHAGOGASTRODUODENOSCOPY (EGD);  Surgeon: Inda Castle, MD;  Location: [facility];  Service: Endoscopy;  Laterality: N/A;  . GASTRIC VARICES BANDING  07/16/2012   Procedure: GASTRIC VARICES BANDING;  Surgeon: Inda Castle, MD;  Location: [facility] ENDOSCOPY;  Service: Endoscopy;  Laterality: N/A;  . HIP ARTHROPLASTY Bilateral   . IR GENERIC HISTORICAL  06/25/2016   IR RADIOLOGIST EVAL & MGMT 06/25/2016 [facility]  . IR GENERIC HISTORICAL  06/27/2016   IR KYPHO LUMBAR INC FX REDUCE BONE BX UNI/BIL CANNULATION INC/IMAGING 06/27/2016 Luanne Bras, MD [facility]  . IR GENERIC HISTORICAL  07/17/2016   IR RADIOLOGIST EVAL & MGMT 07/17/2016 [facility]  . NECK SURGERY    . SHOULDER SURGERY Right     OB History    No data available  Home Medications    Prior to Admission medications   Medication Sig Start Date End Date Taking? Authorizing Provider  acetaminophen (TYLENOL) 500 MG tablet Take 500 mg by mouth every 6 (six) hours as needed for fever or headache (pain).    Historical Provider, MD  albuterol (PROVENTIL HFA;VENTOLIN HFA) 108 (90 Base) MCG/ACT inhaler Inhale 2 puffs into the lungs every 6 (six) hours as needed for wheezing or shortness of breath. 05/27/16   Kristen N Ward, DO  albuterol (PROVENTIL) (2.5 MG/3ML) 0.083% nebulizer solution Take 2.5 mg by  nebulization every 4 (four) hours as needed for wheezing or shortness of breath.  04/11/16   Historical Provider, MD  allopurinol (ZYLOPRIM) 300 MG tablet take 1 tablet by mouth daily for gout 10/14/16   Venetia Maxon Rama, MD  ALPRAZolam Duanne Moron) 0.5 MG tablet Take 0.5 mg by mouth daily as needed for anxiety or sleep.    Historical Provider, MD  aspirin EC 81 MG tablet Take 81 mg by mouth at bedtime.    Historical Provider, MD  azithromycin (ZITHROMAX Z-PAK) 250 MG tablet 2 po day one, then 1 daily x 4 days 11/15/16   Loma Sousa Forcucci, PA-C  bisoprolol (ZEBETA) 10 MG tablet Take 1 tablet (10 mg total) by mouth daily. 10/29/16   Vicie Mutters, PA-C  citalopram (CELEXA) 40 MG tablet Take 1 tablet (40 mg total) by mouth daily. 10/14/16   Venetia Maxon Rama, MD  cyclobenzaprine (FLEXERIL) 10 MG tablet Take 1 tablet (10 mg total) by mouth 3 (three) times daily as needed for muscle spasms. 03/06/16   Unk Pinto, MD  dextromethorphan-guaiFENesin [place] DM) 30-600 MG 12hr tablet Take 1 tablet by mouth 2 (two) times daily. 11/11/16   Nishant Dhungel, MD  ferrous sulfate 325 (65 FE) MG tablet Take 325 mg by mouth daily.     Historical Provider, MD  furosemide (LASIX) 80 MG tablet Take 0.5-1 tablets (40-80 mg total) by mouth See admin instructions. Take 1 tablet (80 mg) by mouth every morning and 1/2 tablet (40 mg) after supper 10/14/16   Venetia Maxon Rama, MD  gabapentin (NEURONTIN) 300 MG capsule Take 1 capsule (300 mg total) by mouth 3 (three) times daily. 10/14/16   Christina P Rama, MD  ipratropium (ATROVENT) 0.02 % nebulizer solution Take 2.5 mLs (0.5 mg total) by nebulization every 4 (four) hours as needed for wheezing or shortness of breath. 10/14/16   Venetia Maxon Rama, MD  lactulose (CHRONULAC) 10 GM/15ML solution Take 30 g by mouth daily as needed for mild constipation (to regulate ammonia levels).    Historical Provider, MD  Magnesium 250 MG TABS Take 250 mg by mouth at bedtime.     Historical Provider, MD    meclizine (ANTIVERT) 25 MG tablet Take 1 tablet (25 mg total) by mouth 2 (two) times daily. 10/14/16   Venetia Maxon Rama, MD  ondansetron (ZOFRAN) 4 MG tablet Take 1 tablet (4 mg total) by mouth every 6 (six) hours as needed for nausea or vomiting. 10/16/16   Vicie Mutters, PA-C  OXYGEN Inhale 2 L into the lungs continuous.     Historical Provider, MD  pantoprazole (PROTONIX) 40 MG tablet Take 1 tablet (40 mg total) by mouth 2 (two) times daily as needed for heartburn 10/14/16   Venetia Maxon Rama, MD  potassium chloride (K-DUR) 10 MEQ tablet Take 6 tablets (60 mEq total) by mouth daily. 11/19/16   Unk Pinto, MD  potassium chloride 20 MEQ TBCR Take 40 mEq by mouth 2 (  two) times daily. Take 3 tablets (30 meq) by mouth every morning and 2 tablets (20 meq) every evening and at bedtime Patient taking differently: Take 40-60 mEq by mouth See admin instructions. Take 3 tablets by mouth every morning then 2 tablets every evening and at bedtime 10/14/16   Venetia Maxon Rama, MD  predniSONE (DELTASONE) 20 MG tablet Take 1 tablet (20 mg total) by mouth daily with breakfast. 11/11/16   Nishant Dhungel, MD  traMADol (ULTRAM) 50 MG tablet take 1 TABLET BY MOUTH EVERY 8 HOURS 10/24/16   Vicie Mutters, PA-C  triamcinolone cream (KENALOG) 0.1 % Apply 1 application topically 3 (three) times daily as needed for itching. 03/22/16   Historical Provider, MD    Family History Family History  Problem Relation Age of Onset  . Diabetes Brother     deceased  . Heart disease Sister     A Fib  . Heart disease Mother     CHF  . Atrial fibrillation Sister   . Colon cancer Neg Hx     Social History Social History  Substance Use Topics  . Smoking status: Former Smoker    Packs/day: 1.00    Years: 50.00    Types: Cigarettes    Quit date: 11/05/2014  . Smokeless tobacco: Never Used     Comment: Quit April 2016  . Alcohol use No     Allergies   Atorvastatin; Diphenhydramine hcl (sleep); Hydrocodone-acetaminophen;  Lopid [gemfibrozil]; Loratadine; Lorazepam; Simvastatin; Sulfamethoxazole; and Sulfonamide derivatives   Review of Systems Review of Systems  Constitutional: Positive for chills, fatigue and fever.  HENT: Negative for congestion, ear pain and sore throat.   Eyes: Negative for pain and visual disturbance.  Respiratory: Positive for cough. Negative for shortness of breath.   Cardiovascular: Negative for chest pain and palpitations.  Gastrointestinal: Positive for constipation. Negative for abdominal pain, diarrhea and vomiting.  Genitourinary: Negative for dysuria and hematuria.  Musculoskeletal: Negative for arthralgias and back pain.  Skin: Negative for color change and rash.  Neurological: Negative for seizures, syncope and headaches.  All other systems reviewed and are negative.    Physical Exam Updated Vital Signs BP (!) 145/57   Pulse 68   Temp 98.3 F (36.8 C) (Oral)   Resp 17   SpO2 98%   Physical Exam  Constitutional: She appears well-developed and well-nourished. She has a sickly appearance. No distress.  HENT:  Head: Normocephalic and atraumatic.  Eyes: Conjunctivae and EOM are normal.  Neck: Neck supple.  Cardiovascular: Normal rate and regular rhythm.   No murmur heard. Pulmonary/Chest: Effort normal. No respiratory distress. She has wheezes in the right upper field, the right middle field, the right lower field, the left upper field, the left middle field and the left lower field.  Abdominal: Soft. She exhibits distension and ascites. There is no tenderness.  Musculoskeletal: She exhibits no edema.  Neurological: She is alert.  Skin: Skin is warm and dry.     Psychiatric: She has a normal mood and affect.  Nursing note and vitals reviewed.    ED Treatments / Results  Labs (all labs ordered are listed, but only abnormal results are displayed) Labs Reviewed  COMPREHENSIVE METABOLIC PANEL - Abnormal; Notable for the following:       Result Value   Sodium  134 (*)    Potassium 2.6 (*)    Chloride 88 (*)    CO2 35 (*)    Glucose, Bld 231 (*)    Creatinine, Ser  1.32 (*)    Total Protein 5.4 (*)    Albumin 3.0 (*)    Alkaline Phosphatase 148 (*)    Total Bilirubin 1.4 (*)    GFR calc non Af Amer 40 (*)    GFR calc Af Amer 46 (*)    All other components within normal limits  CBC WITH DIFFERENTIAL/PLATELET - Abnormal; Notable for the following:    RBC 3.39 (*)    Hemoglobin 10.8 (*)    HCT 34.0 (*)    MCV 100.3 (*)    Platelets 99 (*)    All other components within normal limits  I-STAT CG4 LACTIC ACID, ED - Abnormal; Notable for the following:    Lactic Acid, Venous 2.60 (*)    All other components within normal limits  CULTURE, BLOOD (ROUTINE X 2)  CULTURE, BLOOD (ROUTINE X 2)  PROTIME-INR  URINALYSIS, ROUTINE W REFLEX MICROSCOPIC  AMMONIA  BRAIN NATRIURETIC PEPTIDE  MAGNESIUM  I-STAT TROPOININ, ED    EKG  EKG Interpretation None       Radiology Dg Chest 2 View  Result Date: 11/30/2016 CLINICAL DATA:  Cough and shortness of breath. EXAM: CHEST  2 VIEW COMPARISON:  11/07/2016.  10/31/2016.  01/21/2016 . FINDINGS: Mediastinum and hilar structures normal. Heart size normal. Low lung volumes with mild bibasilar infiltrates. Tiny stable nodule right upper lobe most likely tiny granuloma. No pleural effusion or pneumothorax. Prior cervical spine fusion. Right shoulder replacement . IMPRESSION: Low lung volumes with mild bibasilar infiltrates. Findings consist with mild bibasilar pneumonia. Findings are slightly more prominent on today's exam. Electronically Signed   By: Marcello Moores  Register   On: 11/30/2016 17:06    Procedures Procedures (including critical care time)  Medications Ordered in ED Medications  ipratropium-albuterol (DUONEB) 0.5-2.5 (3) MG/3ML nebulizer solution 3 mL (not administered)  ceFEPIme (MAXIPIME) 1 g in dextrose 5 % 50 mL IVPB (1 g Intravenous New Bag/Given 11/30/16 1819)  vancomycin (VANCOCIN) 1,500 mg in  sodium chloride 0.9 % 500 mL IVPB (1,500 mg Intravenous New Bag/Given 11/30/16 1819)  ceFEPIme (MAXIPIME) 1 g in dextrose 5 % 50 mL IVPB (not administered)  vancomycin (VANCOCIN) 1,250 mg in sodium chloride 0.9 % 250 mL IVPB (not administered)  potassium chloride SA (K-DUR,KLOR-CON) CR tablet 40 mEq (not administered)  magnesium sulfate IVPB 2 g 50 mL (not administered)  sodium chloride 0.9 % bolus 1,000 mL (1,000 mLs Intravenous New Bag/Given 11/30/16 1813)     Initial Impression / Assessment and Plan / ED Course  I have reviewed the triage vital signs and the nursing notes.  Pertinent labs & imaging results that were available during my care of the patient were reviewed by me and considered in my medical decision making (see chart for details).     71 year old female with history of COPD, asthma on 2 L home oxygen, cirrhosis, CK D who presents in the setting of fevers, shortness of breath, fatigue. Patient reports for the last month she's had intermittent fevers and laceration to fever of 101. Patient reports worsening coughing and fatigue.  Home health nurse visited patient today and was advised to come to the emergency for further evaluation.  On arrival patient hemodynamically stable and afebrile. Patient with wheezing in all lung fields on examination. Patient with swelling in lower extremities with venous stasis ulcers. Patient on home 2 L of oxygen. Patient with abdominal distention but no significant abdominal tenderness. Chest x-ray concerning for pneumonia which is worsened from previous chest x-ray. This time patient  will be treated for healthcare associated pneumonia with think my sinus cefepime. Additionally will give 1 L normal saline bolus. Patient additionally found to have lactic acid acidosis on laboratory analysis and hypokalemia. Potassium repleted. Additionally will give magnesium and check magnesium levels. Low suspicion for SBP as patient has no significant abdominal pain.  Patient's presentation is consistent with likely pneumonia but I have concern for possible CHF as well. Patient will require admission to hospital for further management as condition was stable at time of transfer of care.  Final Clinical Impressions(s) / ED Diagnoses   Final diagnoses:  HCAP (healthcare-associated pneumonia)  Fever in adult  Hypokalemia  Lactic acid acidosis    New Prescriptions New Prescriptions   No medications on file     Esaw Grandchild, MD 11/30/16 2229    Virgel Manifold, MD 12/05/16 [phone]

## 2016-11-30 NOTE — H&P (Signed)
History and Physical  Patient Name: [name]     WUG:[id number]    DOB: 02-May-1946    DOA: 11/30/2016 PCP: Alesia Richards, MD   Patient coming from: Home  Chief Complaint: Fever, wheezing, weight gain  HPI: [name] is a 71 y.o. female with a past medical history significant for COPD on home O2, NASH cirrhosis with hx varices and HE, fibromyalgia, CKD baseline Cr 1.2, and HTN who presents with fever and wheezing.  The patient was admitted twice this spring for fever: -First time she had no fever or leukocytosis in the ER, just reported fever at home and lactic acidosis.  Abdomen US neg for ascites (no SBP, just like no ascites when admitted for abdominal pain in Dec 2017), and CT maxilla showed no dental abscess.  She had opacity on CXR and was treated with vanc/Zosyn to Augmentin at discharge. -Second time (three weeks ago), was admitted again with fever and lactic acidosis, this time chills and malaise too, tested positive for influenza B.  Treated with Tamiflu and Levaquin for pneumonia, prednisone taper and BDs for COPD flare.   -After discharge, she was seen two weeks ago and had temp 100.70F at her PCP's office, was treated again with antibiotics (with a Zpak), no effect.    Since then, her husband says she has had a low grade temp each night but in the last two nights she has had temp to 101F.  She has been mostly inactive, but that is essentially her baseline because of cirrshosis, HFpEF, obesity, and COPD.  She has had no new cough, sputum production (just her chronic clear sputum) or malaise.  She has had no new dyspnea.  No new jaw claudication or dental pain.  No joint pains.    She has had a few lbs weight gain, and leg swelling, so her husband gave her her PRN metolazone this morning.  When her [facility] RN came over today, she was concerned about the fever last night, wheezing on exam and leg swelling, so called her PCP's office.  The patient went to a dermatology  appointment today, and was planning to go out to dinner tonight when her PCP's office called to tell her to go to the hospital for evaluation of fever, wheezing, weight gain.  ED course: -Afebrile, heart rate 63, respirations 18 and pulse ox normal on home O2, BP 136/47 -Na 134, K 2.6, Cr 1.32 (baseline 1.1-1.3), WBC 4.6K, Hgb 10.8 and macrocytic (at baseline), mild thrombocytopenia at baseline -INR normal -Lactic acid 2.6 -Ammonia 55 -BNP 144 -Troponin normal -Mag 1.9 -CXR showed persistent, possibly worsening mild bilateral lower airspace disease -Blood cultures were obtained and she was administered 1L NS and vanc/Cefepime and TRH were asked to evaluate for fever, possible HCAP        ROS: Review of Systems  Constitutional: Positive for fever. Negative for chills, malaise/fatigue and weight loss.  HENT: Negative for congestion and sore throat.   Eyes: Negative for blurred vision.  Respiratory: Positive for wheezing. Negative for cough, sputum production and shortness of breath.   Cardiovascular: Positive for leg swelling. Negative for chest pain.  Gastrointestinal: Negative for abdominal pain (other than her normal discomfort), blood in stool, constipation, diarrhea, melena, nausea and vomiting.  Genitourinary: Negative for dysuria, frequency and urgency.       Yeast candidiasis  Musculoskeletal: Negative for joint pain.  Skin: Negative for rash.  Neurological: Positive for weakness (deconditioning). Negative for dizziness, sensory change, speech change, focal  weakness, seizures, loss of consciousness and headaches.  Psychiatric/Behavioral: Negative for memory loss (or confusion).          Past Medical History:  Diagnosis Date  . Asthma   . COPD (chronic obstructive pulmonary disease) ([place])   . Esophageal varices ([place])   . Fibromyalgia   . Gastritis   . Hepatic cirrhosis ([place])   . Hepatic encephalopathy ([place])   . Hyperlipidemia   . Hypertension   . IBS (irritable  bowel syndrome)   . Splenomegaly   . Vitamin D deficiency     Past Surgical History:  Procedure Laterality Date  . ABDOMINAL HYSTERECTOMY    . BACK SURGERY    . CHOLECYSTECTOMY    . ESOPHAGOGASTRODUODENOSCOPY  07/16/2012   Procedure: ESOPHAGOGASTRODUODENOSCOPY (EGD);  Surgeon: Inda Castle, MD;  Location: [facility];  Service: Endoscopy;  Laterality: N/A;  . GASTRIC VARICES BANDING  07/16/2012   Procedure: GASTRIC VARICES BANDING;  Surgeon: Inda Castle, MD;  Location: [facility];  Service: Endoscopy;  Laterality: N/A;  . HIP ARTHROPLASTY Bilateral   . IR GENERIC HISTORICAL  06/25/2016   IR RADIOLOGIST EVAL & MGMT 06/25/2016 [facility]  . IR GENERIC HISTORICAL  06/27/2016   IR KYPHO LUMBAR INC FX REDUCE BONE BX UNI/BIL CANNULATION INC/IMAGING 06/27/2016 Luanne Bras, MD [facility]  . IR GENERIC HISTORICAL  07/17/2016   IR RADIOLOGIST EVAL & MGMT 07/17/2016 [facility]  . NECK SURGERY    . SHOULDER SURGERY Right     Social History: Patient lives with her husband.  The patient walks with a walker or uses wheelchair.  She is a former smoker, quit about 3 years ago.  She is from [place].  She was a homemaker.    Allergies  Allergen Reactions  . Atorvastatin Other (See Comments)    Doesn't remember reaction  . Diphenhydramine Hcl (Sleep) Hives  . Hydrocodone-Acetaminophen Other (See Comments)    Reaction not remembered  . Lopid [Gemfibrozil] Other (See Comments)    Doesn't remember reaction  . Loratadine Hives  . Lorazepam Hives  . Simvastatin Other (See Comments)    Reaction not remembered  . Sulfamethoxazole Hives  . Sulfonamide Derivatives Hives    Family history: family history includes Atrial fibrillation in her sister; Diabetes in her brother; Heart disease in her mother and sister.  Prior to Admission medications   Medication Sig Start Date End Date Taking? Authorizing Provider  acetaminophen (TYLENOL) 500 MG tablet Take 500 mg by mouth  every 6 (six) hours as needed for fever or headache (pain).   Yes Historical Provider, MD  albuterol (PROVENTIL HFA;VENTOLIN HFA) 108 (90 Base) MCG/ACT inhaler Inhale 2 puffs into the lungs every 6 (six) hours as needed for wheezing or shortness of breath. 05/27/16  Yes Kristen N Ward, DO  albuterol (PROVENTIL) (2.5 MG/3ML) 0.083% nebulizer solution Take 2.5 mg by nebulization every 4 (four) hours as needed for wheezing or shortness of breath.  04/11/16  Yes Historical Provider, MD  allopurinol (ZYLOPRIM) 300 MG tablet take 1 tablet by mouth daily for gout 10/14/16  Yes Venetia Maxon Rama, MD  ALPRAZolam Duanne Moron) 0.5 MG tablet Take 0.5 mg by mouth daily as needed for anxiety or sleep.   Yes Historical Provider, MD  aspirin EC 81 MG tablet Take 81 mg by mouth at bedtime.   Yes Historical Provider, MD  bisoprolol (ZEBETA) 10 MG tablet Take 1 tablet (10 mg total) by mouth daily. 10/29/16  Yes Vicie Mutters, PA-C  Cholecalciferol (VITAMIN D3)  5000 units CAPS Take 5,000 Units by mouth daily with breakfast.   Yes Historical Provider, MD  citalopram (CELEXA) 40 MG tablet Take 1 tablet (40 mg total) by mouth daily. 10/14/16  Yes Venetia Maxon Rama, MD  cyclobenzaprine (FLEXERIL) 10 MG tablet Take 1 tablet (10 mg total) by mouth 3 (three) times daily as needed for muscle spasms. 03/06/16  Yes Unk Pinto, MD  dextromethorphan-guaiFENesin [facility] DM) 30-600 MG 12hr tablet Take 1 tablet by mouth 2 (two) times daily. 11/11/16  Yes Nishant Dhungel, MD  ferrous sulfate 325 (65 FE) MG tablet Take 325 mg by mouth daily.    Yes Historical Provider, MD  furosemide (LASIX) 80 MG tablet Take 0.5-1 tablets (40-80 mg total) by mouth See admin instructions. Take 1 tablet (80 mg) by mouth every morning and 1/2 tablet (40 mg) after supper Patient taking differently: Take 40-80 mg by mouth See admin instructions. 80 mg in the morning and 40 mg in the (late) afternoon 10/14/16  Yes Christina P Rama, MD  gabapentin (NEURONTIN) 300 MG  capsule Take 1 capsule (300 mg total) by mouth 3 (three) times daily. 10/14/16  Yes Christina P Rama, MD  ipratropium (ATROVENT) 0.02 % nebulizer solution Take 2.5 mLs (0.5 mg total) by nebulization every 4 (four) hours as needed for wheezing or shortness of breath. 10/14/16  Yes Christina P Rama, MD  lactulose (CHRONULAC) 10 GM/15ML solution Take 30-45 g by mouth See admin instructions. One to two times a day to regulate ammonia levels   Yes Historical Provider, MD  Magnesium 250 MG TABS Take 250 mg by mouth at bedtime.    Yes Historical Provider, MD  meclizine (ANTIVERT) 25 MG tablet Take 1 tablet (25 mg total) by mouth 2 (two) times daily. 10/14/16  Yes Venetia Maxon Rama, MD  metolazone (ZAROXOLYN) 5 MG tablet Take 2.5 mg by mouth daily as needed (for a weight gain of 2-3 pounds in 24 hours).  10/09/16  Yes Historical Provider, MD  ondansetron (ZOFRAN) 4 MG tablet Take 1 tablet (4 mg total) by mouth every 6 (six) hours as needed for nausea or vomiting. 10/16/16  Yes Vicie Mutters, PA-C  OXYGEN Inhale 2 L into the lungs continuous. 3 L with exertion   Yes Historical Provider, MD  pantoprazole (PROTONIX) 40 MG tablet Take 1 tablet (40 mg total) by mouth 2 (two) times daily as needed for heartburn Patient taking differently: Take 40 mg by mouth daily before breakfast.  10/14/16  Yes Venetia Maxon Rama, MD  potassium chloride (K-DUR) 10 MEQ tablet Take 6 tablets (60 mEq total) by mouth daily. Patient taking differently: Take 30 mEq by mouth in the morning then 20 mEq in the evening then 20 mEq at bedtime (take an additional 10 mEq if taking Metolazone) 11/19/16  Yes Unk Pinto, MD  traMADol (ULTRAM) 50 MG tablet take 1 TABLET BY MOUTH EVERY 8 HOURS Patient taking differently: Take 50 mg by mouth every 8 hours as needed for pain 10/24/16  Yes Vicie Mutters, PA-C  triamcinolone cream (KENALOG) 0.1 % Apply 1 application topically 3 (three) times daily as needed for itching. 03/22/16  Yes Historical Provider, MD         Physical Exam: BP (!) 142/56   Pulse 87   Temp 98.3 F (36.8 C) (Oral)   Resp 12   SpO2 99%  General appearance: Elderly obese chronically ill-appearing adult female, alert and in no acute distress, asking if she can have her usual Friday night cheeseburger.  Eyes: Anicteric, conjunctiva pink, lids and lashes normal. PERRL.    ENT: No nasal deformity, discharge, epistaxis.  Hearing normal. OP dry without lesions.  No jaw pain.  No temporal tenderness.  Upper dentures in place.   Neck: No neck masses.  Trachea midline.  No thyromegaly/tenderness. Lymph: No cervical or supraclavicular lymphadenopathy. Skin: Warm and dry.  No jaundice.  No suspicious rashes or lesions. Cardiac: RRR, nl S1-S2, no murmurs appreciated.  Capillary refill is brisk.  JVP not visible.  Mild LE edema with chronic brawny change.  Radial and DP pulses 2+ and symmetric. Respiratory: Shallow breaths given gut.  Mild tachypnea.  Wheezing, no rales. Abdomen: Abdomen round and distended.  No tenderness to palpation.  Cannot palpate liver. GU: THe perineum is not indurated or have cellulitis, but there is rednes and scaling around the labia. MSK: No deformities or effusions.  No cyanosis or clubbing. Neuro: Cranial nerves normal.  Sensation intact to light touch. Speech is fluent.  Muscle strength 5-/5 and symmetric in upper and lower extremities.    Psych: Sensorium intact and responding to questions, attention normal.  Behavior appropriate.  Affect normal.  Judgment and insight appear normal.     Labs on Admission:  I have personally reviewed following labs and imaging studies: CBC:  Recent Labs Lab 11/30/16 1644  WBC 4.6  NEUTROABS 3.0  HGB 10.8*  HCT 34.0*  MCV 100.3*  PLT 99*   Basic Metabolic Panel:  Recent Labs Lab 11/30/16 1644 11/30/16 1835  NA 134*  --   K 2.6*  --   CL 88*  --   CO2 35*  --   GLUCOSE 231*  --   BUN 11  --   CREATININE 1.32*  --   CALCIUM 9.4  --   MG  --  1.9    GFR: CrCl cannot be calculated (Unknown ideal weight.).  Liver Function Tests:  Recent Labs Lab 11/30/16 1644  AST 41  ALT 37  ALKPHOS 148*  BILITOT 1.4*  PROT 5.4*  ALBUMIN 3.0*   No results for input(s): LIPASE, AMYLASE in the last 168 hours.  Recent Labs Lab 11/30/16 1810  AMMONIA 55*   Coagulation Profile:  Recent Labs Lab 11/30/16 1644  INR 1.06   Cardiac Enzymes: No results for input(s): CKTOTAL, CKMB, CKMBINDEX, TROPONINI in the last 168 hours. BNP (last 3 results) No results for input(s): PROBNP in the last 8760 hours. HbA1C: No results for input(s): HGBA1C in the last 72 hours. CBG: No results for input(s): GLUCAP in the last 168 hours. Lipid Profile: No results for input(s): CHOL, HDL, LDLCALC, TRIG, CHOLHDL, LDLDIRECT in the last 72 hours. Thyroid Function Tests: No results for input(s): TSH, T4TOTAL, FREET4, T3FREE, THYROIDAB in the last 72 hours. Anemia Panel: No results for input(s): VITAMINB12, FOLATE, FERRITIN, TIBC, IRON, RETICCTPCT in the last 72 hours. Sepsis Labs: Lactic acid 2.6 Invalid input(s): PROCALCITONIN, LACTICIDVEN No results found for this or any previous visit (from the past 240 hour(s)).       Radiological Exams on Admission: Personally reviewed CXR shows bilateral lower airspace opacities, no real change from previous: Dg Chest 2 View  Result Date: 11/30/2016 CLINICAL DATA:  Cough and shortness of breath. EXAM: CHEST  2 VIEW COMPARISON:  11/07/2016.  10/31/2016.  01/21/2016 . FINDINGS: Mediastinum and hilar structures normal. Heart size normal. Low lung volumes with mild bibasilar infiltrates. Tiny stable nodule right upper lobe most likely tiny granuloma. No pleural effusion or pneumothorax. Prior cervical spine fusion.  Right shoulder replacement . IMPRESSION: Low lung volumes with mild bibasilar infiltrates. Findings consist with mild bibasilar pneumonia. Findings are slightly more prominent on today's exam. Electronically  Signed   By: [place]   On: 11/30/2016 17:06    EKG: Independently reviewed. Rate 65, QTc 490, RBBB old.  Echocarfdiogram 2017: Report reviewed EF 55-60% Mod LVH Garde I DD          Assessment/Plan  1. Fever:  Doubt infection.  Will observe fever curve overnight.  This CXR and worsening fever may represent recurrent pneumonia, but in the absence of worsening cough/sputum production, fever here or leukocytosis, I will hold antibiotics.  Given her habitus and torpidity, suspect this may just be atelectasis.    Doubt cellulitis.  No perineal infection.  HIV ruled out before.  No ascites last two admissions, no new abdominal pain, doubt SBP.  No jaw claudication.  No joint pains.  No weight loss.  Low blood counts are chronic and related to her liver/renal disease, but could mask malignancy if this persists.  Her lactic acidosis is from liver disease, she does NOT meet SIRS criteria nor clinically have sepsis.  -Check procalcitonin -Check ESR/CRP -Follow blood cultures and urine, check sputum culture -Encourage incentive spirometer   2. Hypokalemia:  Mag and K given in ER. -Resume home mag and K supplements  3. COPD with wheezing:  Just finished prednisone taper this month, and without dyspnea or chest tightness.  Will defer repeat steroids for now. -Albuterol PRN  4. Chronic cirrhosis with history of hepatic encephalopathy:  -Continue lactulose -Continue furosemide  5. CKD 3:  Near baseline.  6. Non-insulin-dependent diabetes:  Normoglycemic at admission -SSI with meals  7. CHF with preserved ejection fraction:  Volume status hard to assess.  Probably fluid overloaded.  Got metolazone at home today. -Furosemide 40 mg IV once now -Continue home oral furosemide tomorrow  8. Thrombocytopenia: Stable  9. Anemia of chronic disease: Stable -Continue iron  10. Hypertension: Normotensive here -Continue aspirin, BB  11. Other medications: -Continue  gabapentin, citalopram, Xanax -Continue allopurinol -Continue PPI  12. Yeast candidiasis: Given # of accreted QT prolonging medicaitons already and her QT interval being somewhat long from her RBBB, would maybe try topicals before fluconazole.          DVT prophylaxis: Lovenox  Code Status: Partial  Family Communication: Husband  Disposition Plan: Anticipate observe fever curve overnight.  If procalcitonin very high, home with Augmentin or Levaquin.  Consults called: None Admission status: OBS At the point of initial evaluation, it is my clinical opinion that admission for OBSERVATION is reasonable and necessary because the patient's presenting complaints in the context of their chronic conditions represent sufficient risk of deterioration or significant morbidity to constitute reasonable grounds for close observation in the hospital setting, but that the patient may be medically stable for discharge from the hospital within 24 to 48 hours.    Medical decision making: Patient seen at 7:40 PM on 11/30/2016.  The patient was discussed with Dr. Jearld Pies.  What exists of the patient's chart was reviewed in depth and summarized above.  Clinical condition: stable.        Edwin Dada [facility] Pager [phone]

## 2016-11-30 NOTE — Telephone Encounter (Signed)
Spoke with the patient's spouse in regard to a report from [facility].  They reported the patient has a low grade fever, has slight wheezing and gained 2-3 pounds.. The spouse was called and advised to take the patient to [facility] to be evaluated, per Dr Melford Aase.

## 2016-11-30 NOTE — Progress Notes (Signed)
Pharmacy Antibiotic Note GALILEE PIERRON is a 71 y.o. female admitted on 11/30/2016 with concern for pneumonia.  Pharmacy has been consulted for Cefepime and vancomycin dosing.  Plan: 1. Vancomycin 1250 IV every 24 hours.  Goal trough 15-20 mcg/mL. 2. Cefepime 1 gram IV every 12 hours.  3. If MRSA PCR negative would consider stopping vancomycin.     Temp (24hrs), Avg:98.3 F (36.8 C), Min:98.3 F (36.8 C), Max:98.3 F (36.8 C)   Recent Labs Lab 11/30/16 1644 11/30/16 1703  WBC 4.6  --   CREATININE 1.32*  --   LATICACIDVEN  --  2.60*    CrCl cannot be calculated (Unknown ideal weight.).    Allergies  Allergen Reactions  . Atorvastatin Other (See Comments)    Doesn't remember  . Diphenhydramine Hcl (Sleep) Hives  . Hydrocodone-Acetaminophen Other (See Comments)    Doesn't remember  . Lopid [Gemfibrozil] Other (See Comments)    Doesn't remember  . Loratadine Hives  . Lorazepam Hives  . Simvastatin Other (See Comments)    Unknown reaction  . Sulfamethoxazole Hives  . Sulfonamide Derivatives Hives    Antimicrobials this admission: 4/27 Cefepime >>  4/27 vancomycin >>   Microbiology results: 4/27 BCx: px  Thank you for allowing pharmacy to be a part of this patient's care.  Vincenza Hews, PharmD, BCPS 11/30/2016, 6:15 PM

## 2016-11-30 NOTE — ED Triage Notes (Signed)
Pt reports running low grade fever since recent hospital admission, reports being admitted for flu and pneumonia. Denies increase in cough, secretions, sob. No resp distress is noted at triage, on home O2. Skin w.d. ekg done.

## 2016-11-30 NOTE — ED Notes (Signed)
Patient Lab results reported to Nurse [facility].

## 2016-11-30 NOTE — ED Notes (Signed)
Copy of lactic acid results 2.15 given to RN Lennette Bihari, Dr Wilson Singer

## 2016-12-01 ENCOUNTER — Observation Stay (HOSPITAL_COMMUNITY): Payer: PPO

## 2016-12-01 DIAGNOSIS — R509 Fever, unspecified: Secondary | ICD-10-CM

## 2016-12-01 DIAGNOSIS — E1122 Type 2 diabetes mellitus with diabetic chronic kidney disease: Secondary | ICD-10-CM | POA: Diagnosis not present

## 2016-12-01 DIAGNOSIS — R0602 Shortness of breath: Secondary | ICD-10-CM | POA: Diagnosis not present

## 2016-12-01 DIAGNOSIS — Z87891 Personal history of nicotine dependence: Secondary | ICD-10-CM | POA: Diagnosis not present

## 2016-12-01 DIAGNOSIS — E872 Acidosis: Secondary | ICD-10-CM | POA: Diagnosis not present

## 2016-12-01 DIAGNOSIS — Z79899 Other long term (current) drug therapy: Secondary | ICD-10-CM | POA: Diagnosis not present

## 2016-12-01 DIAGNOSIS — R188 Other ascites: Secondary | ICD-10-CM | POA: Diagnosis not present

## 2016-12-01 DIAGNOSIS — J189 Pneumonia, unspecified organism: Secondary | ICD-10-CM | POA: Diagnosis not present

## 2016-12-01 DIAGNOSIS — I129 Hypertensive chronic kidney disease with stage 1 through stage 4 chronic kidney disease, or unspecified chronic kidney disease: Secondary | ICD-10-CM | POA: Diagnosis not present

## 2016-12-01 DIAGNOSIS — J449 Chronic obstructive pulmonary disease, unspecified: Secondary | ICD-10-CM | POA: Diagnosis not present

## 2016-12-01 DIAGNOSIS — E876 Hypokalemia: Secondary | ICD-10-CM | POA: Diagnosis not present

## 2016-12-01 DIAGNOSIS — Z7982 Long term (current) use of aspirin: Secondary | ICD-10-CM | POA: Diagnosis not present

## 2016-12-01 DIAGNOSIS — A419 Sepsis, unspecified organism: Secondary | ICD-10-CM | POA: Diagnosis not present

## 2016-12-01 DIAGNOSIS — N183 Chronic kidney disease, stage 3 (moderate): Secondary | ICD-10-CM | POA: Diagnosis not present

## 2016-12-01 LAB — GLUCOSE, CAPILLARY
GLUCOSE-CAPILLARY: 315 mg/dL — AB (ref 65–99)
Glucose-Capillary: 384 mg/dL — ABNORMAL HIGH (ref 65–99)
Glucose-Capillary: 317 mg/dL — ABNORMAL HIGH (ref 65–99)
Glucose-Capillary: 344 mg/dL — ABNORMAL HIGH (ref 65–99)

## 2016-12-01 LAB — COMPREHENSIVE METABOLIC PANEL
ALK PHOS: 137 U/L — AB (ref 38–126)
ALT: 27 U/L (ref 14–54)
AST: 55 U/L — AB (ref 15–41)
Albumin: 2.6 g/dL — ABNORMAL LOW (ref 3.5–5.0)
Anion gap: 12 (ref 5–15)
BILIRUBIN TOTAL: 1.2 mg/dL (ref 0.3–1.2)
BUN: 14 mg/dL (ref 6–20)
CALCIUM: 8.8 mg/dL — AB (ref 8.9–10.3)
CHLORIDE: 91 mmol/L — AB (ref 101–111)
CO2: 32 mmol/L (ref 22–32)
CREATININE: 1.47 mg/dL — AB (ref 0.44–1.00)
GFR calc Af Amer: 41 mL/min — ABNORMAL LOW (ref >60)
GFR, EST NON AFRICAN AMERICAN: 35 mL/min — AB (ref >60)
Glucose, Bld: 426 mg/dL — ABNORMAL HIGH (ref 65–99)
Potassium: 3.3 mmol/L — ABNORMAL LOW (ref 3.5–5.1)
Sodium: 135 mmol/L (ref 135–145)
Total Protein: 4.7 g/dL — ABNORMAL LOW (ref 6.5–8.1)

## 2016-12-01 LAB — CBC
HCT: 30.5 % — ABNORMAL LOW (ref 36.0–46.0)
Hemoglobin: 9.7 g/dL — ABNORMAL LOW (ref 12.0–15.0)
MCH: 32 pg (ref 26.0–34.0)
MCHC: 31.8 g/dL (ref 30.0–36.0)
MCV: 100.7 fL — AB (ref 78.0–100.0)
PLATELETS: 91 10*3/uL — AB (ref 150–400)
RBC: 3.03 MIL/uL — ABNORMAL LOW (ref 3.87–5.11)
RDW: 15.2 % (ref 11.5–15.5)
WBC: 3.6 10*3/uL — AB (ref 4.0–10.5)

## 2016-12-01 LAB — SEDIMENTATION RATE: Sed Rate: 61 mm/hr — ABNORMAL HIGH (ref 0–22)

## 2016-12-01 MED ORDER — MAGNESIUM OXIDE 400 (241.3 MG) MG PO TABS
200.0000 mg | ORAL_TABLET | Freq: Every day | ORAL | Status: DC
  Administered 2016-12-01: 200 mg via ORAL
  Filled 2016-12-01: qty 1

## 2016-12-01 NOTE — Progress Notes (Signed)
PROGRESS NOTE    [name]  OMV:[id number] DOB: 1945-10-09 DOA: 11/30/2016 PCP: Alesia Richards, MD  Outpatient Specialists:     Brief Narrative:  28  COPD on home O2 NASH /Liver cirrhosis with prior Hep encephalopathy c/b esophageal varices + TCP CKD III HTn Gout Htn Bipolar GERD afib vs MAT and h/o prolonged QT Gout  Recent admit 4/4-11/11/16 SIRS, Influenza B, COPD exacerb--Rx levaquin and tamiflu Also admit 3/9-3/111/8-no localizing source-CXR only ?Disease R side Admission 07/11/16-07/13/16 possible peritonitis    Assessment & Plan:   Principal Problem:   Fever Active Problems:   Anemia   Hepatic cirrhosis (HCC)   Essential hypertension   Fibromyalgia   Thrombocytopenia (HCC)   CKD stage 3 due to type 2 diabetes mellitus (HCC)   Hypokalemia   DM type 2 causing CKD stage 3 (HCC)   COPD mixed type (HCC)   Fever CXR shows remnant of CAP-improved from 11/07/16 study Holding Abx PCt is 0.11 which is not concerning for overt infeciton Checking [place] abd for ascites--if + would Rx with augementin or cipro for SBP and get a paracentesis. Might need AI work-up as ESR is up-ESR elevated to 61 as well as CRP at 6.2 Await BC performed in ED 4/27  COPD stg 2-3 on home O2 2 liters baseline Cont albuterol prn, cont Atrovent 2.5 ml q 4 Cont Mucinex 1 tab bid  Cirrhosis 2/2 to [facility] + TCP Cont lasix 40-80 and prn Matolozone Consider adding aldactone Cont BB Bisporlol zebeta 10 daily for 2/2 prevention of bleeding Cont lactulose 30g daily to prevent encephalopathy  Afib/MAT Cont bisoprolol  ckd 2-3 montiro creat Labs am  Bipolar Cont citalopram 40 qdm, xanax 0.5 od prn  Neuropathy vs Fibromyalgia Cont neurotnin 300 tid, flexeril 10 tid  Gout Cont allopruinol  scd D/w husband at bedsdie Inpatient pedning resolution   Consultants:   none  Procedures:   none  Antimicrobials:   None currently    Subjective:  Awake  alert better no  dysuria no cough No fever no chills no diarr Eating ok slightly sleepy  Objective: Vitals:   11/30/16 1949 11/30/16 2000 11/30/16 2040 12/01/16 0430  BP:  (!) 142/56 (!) 137/46 (!) 130/44  Pulse: 89 87 89 88  Resp: 15 12 18 18   Temp:   98.2 F (36.8 C) 98.3 F (36.8 C)  TempSrc:   Oral Oral  SpO2: 95% 99% 98% 97%  Weight:   90.8 kg (200 lb 2.8 oz)   Height:   5' 2"  (1.575 m)     Intake/Output Summary (Last 24 hours) at 12/01/16 0819 Last data filed at 12/01/16 6283  Gross per 24 hour  Intake             1280 ml  Output              600 ml  Net              680 ml   Filed Weights   11/30/16 2040  Weight: 90.8 kg (200 lb 2.8 oz)    Examination:  General exam: Appears calm and comfortable, obese, no jvd mod dentittion with partil in upper Respiratory system: slight wheeze Cardiovascular system: S1 & S2 heard, RRR. No JVD Abdomen is distended, bilateral flank fullness and shift dullness Central nervous system: Alert and oriented. No focal neurological deficits. No tremor Extremities: Symmetric 5 x 5 power. Skin: No rashes, lesions or ulcers Psychiatry: Judgement and insight appear normal. Mood & affect  appropriate.     Data Reviewed: I have personally reviewed following labs and imaging studies  CBC:  Recent Labs Lab 11/30/16 1644 12/01/16 0348  WBC 4.6 3.6*  NEUTROABS 3.0  --   HGB 10.8* 9.7*  HCT 34.0* 30.5*  MCV 100.3* 100.7*  PLT 99* 91*   Basic Metabolic Panel:  Recent Labs Lab 11/30/16 1644 11/30/16 1835 12/01/16 0348  NA 134*  --  135  K 2.6*  --  3.3*  CL 88*  --  91*  CO2 35*  --  32  GLUCOSE 231*  --  426*  BUN 11  --  14  CREATININE 1.32*  --  1.47*  CALCIUM 9.4  --  8.8*  MG  --  1.9  --    GFR: Estimated Creatinine Clearance: 37.3 mL/min (A) (by C-G formula based on SCr of 1.47 mg/dL (H)). Liver Function Tests:  Recent Labs Lab 11/30/16 1644 12/01/16 0348  AST 41 55*  ALT 37 27  ALKPHOS 148* 137*  BILITOT 1.4* 1.2  PROT  5.4* 4.7*  ALBUMIN 3.0* 2.6*   No results for input(s): LIPASE, AMYLASE in the last 168 hours.  Recent Labs Lab 11/30/16 1810  AMMONIA 55*   Coagulation Profile:  Recent Labs Lab 11/30/16 1644  INR 1.06   Cardiac Enzymes: No results for input(s): CKTOTAL, CKMB, CKMBINDEX, TROPONINI in the last 168 hours. BNP (last 3 results) No results for input(s): PROBNP in the last 8760 hours. HbA1C: No results for input(s): HGBA1C in the last 72 hours. CBG:  Recent Labs Lab 11/30/16 2155 12/01/16 0805  GLUCAP 184* 384*   Lipid Profile: No results for input(s): CHOL, HDL, LDLCALC, TRIG, CHOLHDL, LDLDIRECT in the last 72 hours. Thyroid Function Tests: No results for input(s): TSH, T4TOTAL, FREET4, T3FREE, THYROIDAB in the last 72 hours. Anemia Panel: No results for input(s): VITAMINB12, FOLATE, FERRITIN, TIBC, IRON, RETICCTPCT in the last 72 hours. Urine analysis:    Component Value Date/Time   COLORURINE YELLOW 11/30/2016 1852   APPEARANCEUR HAZY (A) 11/30/2016 1852   LABSPEC 1.004 (L) 11/30/2016 1852   PHURINE 7.0 11/30/2016 1852   GLUCOSEU NEGATIVE 11/30/2016 1852   HGBUR NEGATIVE 11/30/2016 1852   BILIRUBINUR NEGATIVE 11/30/2016 1852   KETONESUR NEGATIVE 11/30/2016 1852   PROTEINUR NEGATIVE 11/30/2016 1852   UROBILINOGEN 1 01/13/2015 1145   NITRITE NEGATIVE 11/30/2016 1852   LEUKOCYTESUR NEGATIVE 11/30/2016 1852   Sepsis Labs: @LABRCNTIP (procalcitonin:4,lacticidven:4)  )No results found for this or any previous visit (from the past 240 hour(s)).       Radiology Studies: Dg Chest 2 View  Result Date: 11/30/2016 CLINICAL DATA:  Cough and shortness of breath. EXAM: CHEST  2 VIEW COMPARISON:  11/07/2016.  10/31/2016.  01/21/2016 . FINDINGS: Mediastinum and hilar structures normal. Heart size normal. Low lung volumes with mild bibasilar infiltrates. Tiny stable nodule right upper lobe most likely tiny granuloma. No pleural effusion or pneumothorax. Prior cervical  spine fusion. Right shoulder replacement . IMPRESSION: Low lung volumes with mild bibasilar infiltrates. Findings consist with mild bibasilar pneumonia. Findings are slightly more prominent on today's exam. Electronically Signed   By: [place]   On: 11/30/2016 17:06        Scheduled Meds: . allopurinol  300 mg Oral Daily  . aspirin EC  81 mg Oral QHS  . bisoprolol  10 mg Oral Daily  . citalopram  40 mg Oral Daily  . ferrous sulfate  325 mg Oral Daily  . furosemide  40-80 mg  Oral BID  . gabapentin  300 mg Oral TID  . insulin aspart  0-15 Units Subcutaneous TID WC  . insulin aspart  0-5 Units Subcutaneous QHS  . lactulose  30 g Oral Daily  . Magnesium  250 mg Oral QHS  . pantoprazole  40 mg Oral QAC breakfast  . potassium chloride  30 mEq Oral BID   Continuous Infusions:   LOS: 0 days    Time spent: [place], MD [facility] ([phone]   If 7PM-7AM, please contact night-coverage [email or web address] Password [facility] 12/01/2016, 8:19 AM

## 2016-12-02 DIAGNOSIS — R509 Fever, unspecified: Secondary | ICD-10-CM | POA: Diagnosis not present

## 2016-12-02 LAB — COMPREHENSIVE METABOLIC PANEL
ALT: 29 U/L (ref 14–54)
ANION GAP: 10 (ref 5–15)
AST: 32 U/L (ref 15–41)
Albumin: 2.5 g/dL — ABNORMAL LOW (ref 3.5–5.0)
Alkaline Phosphatase: 138 U/L — ABNORMAL HIGH (ref 38–126)
BUN: 14 mg/dL (ref 6–20)
CHLORIDE: 90 mmol/L — AB (ref 101–111)
CO2: 38 mmol/L — ABNORMAL HIGH (ref 22–32)
Calcium: 8.8 mg/dL — ABNORMAL LOW (ref 8.9–10.3)
Creatinine, Ser: 1.49 mg/dL — ABNORMAL HIGH (ref 0.44–1.00)
GFR calc Af Amer: 40 mL/min — ABNORMAL LOW (ref >60)
GFR calc non Af Amer: 34 mL/min — ABNORMAL LOW (ref >60)
Glucose, Bld: 219 mg/dL — ABNORMAL HIGH (ref 65–99)
POTASSIUM: 2.2 mmol/L — AB (ref 3.5–5.1)
Sodium: 138 mmol/L (ref 135–145)
TOTAL PROTEIN: 4.7 g/dL — AB (ref 6.5–8.1)
Total Bilirubin: 1.1 mg/dL (ref 0.3–1.2)

## 2016-12-02 LAB — CBC WITH DIFFERENTIAL/PLATELET
BASOS ABS: 0 10*3/uL (ref 0.0–0.1)
BASOS PCT: 0 %
EOS PCT: 4 %
Eosinophils Absolute: 0.1 10*3/uL (ref 0.0–0.7)
HEMATOCRIT: 29.3 % — AB (ref 36.0–46.0)
Hemoglobin: 9.2 g/dL — ABNORMAL LOW (ref 12.0–15.0)
Lymphocytes Relative: 24 %
Lymphs Abs: 0.7 10*3/uL (ref 0.7–4.0)
MCH: 31.7 pg (ref 26.0–34.0)
MCHC: 31.4 g/dL (ref 30.0–36.0)
MCV: 101 fL — AB (ref 78.0–100.0)
MONO ABS: 0.2 10*3/uL (ref 0.1–1.0)
MONOS PCT: 7 %
Neutro Abs: 2 10*3/uL (ref 1.7–7.7)
Neutrophils Relative %: 65 %
PLATELETS: 85 10*3/uL — AB (ref 150–400)
RBC: 2.9 MIL/uL — ABNORMAL LOW (ref 3.87–5.11)
RDW: 15.1 % (ref 11.5–15.5)
WBC: 3.1 10*3/uL — ABNORMAL LOW (ref 4.0–10.5)

## 2016-12-02 LAB — PROCALCITONIN: Procalcitonin: 0.16 ng/mL

## 2016-12-02 LAB — PROTIME-INR
INR: 1.1
PROTHROMBIN TIME: 14.3 s (ref 11.4–15.2)

## 2016-12-02 LAB — GLUCOSE, CAPILLARY
GLUCOSE-CAPILLARY: 171 mg/dL — AB (ref 65–99)
GLUCOSE-CAPILLARY: 231 mg/dL — AB (ref 65–99)

## 2016-12-02 MED ORDER — POTASSIUM CHLORIDE CRYS ER 20 MEQ PO TBCR
20.0000 meq | EXTENDED_RELEASE_TABLET | Freq: Once | ORAL | Status: AC
  Administered 2016-12-02: 20 meq via ORAL
  Filled 2016-12-02: qty 1

## 2016-12-02 MED ORDER — SODIUM CHLORIDE 0.9 % IV SOLN
30.0000 meq | Freq: Once | INTRAVENOUS | Status: AC
  Administered 2016-12-02: 30 meq via INTRAVENOUS
  Filled 2016-12-02: qty 15

## 2016-12-02 MED ORDER — MAGNESIUM SULFATE IN D5W 1-5 GM/100ML-% IV SOLN
1.0000 g | Freq: Once | INTRAVENOUS | Status: AC
  Administered 2016-12-02: 1 g via INTRAVENOUS
  Filled 2016-12-02: qty 100

## 2016-12-02 NOTE — Progress Notes (Signed)
Patient discharge teaching given, including activity, diet, follow-up appoints, and medications. Patient verbalized understanding of all discharge instructions. IV access was d/c'd. Vitals are stable. Skin is intact except as charted in most recent assessments. Pt to be escorted out by NT, to be driven home by husband.

## 2016-12-02 NOTE — Discharge Summary (Signed)
Physician Discharge Summary  [name] YEL:[id number] DOB: September 01, 1945 DOA: 11/30/2016  PCP: Alesia Richards, MD  Admit date: 11/30/2016 Discharge date: 12/02/2016  Time spent: 40 minutes  Recommendations for Outpatient Follow-up:  1. Needs OP rheumatology input if persisitng fever 2. Recommend CBC as well as basic metabolic panel in 1 week 3.  Recommend continuation of lactulose as an outpatient 4. Consider addition of Aldactone as an outpatient for cirrhosis  Discharge Diagnoses:  Principal Problem:   Fever Active Problems:   Anemia   Hepatic cirrhosis (HCC)   Essential hypertension   Fibromyalgia   Thrombocytopenia (HCC)   CKD stage 3 due to type 2 diabetes mellitus (HCC)   Hypokalemia   DM type 2 causing CKD stage 3 ([place])   COPD mixed type [facility])   Discharge Condition: Improved  Diet recommendation: Low-salt heart healthy  Filed Weights   11/30/16 2040 12/01/16 2200  Weight: 90.8 kg (200 lb 2.8 oz) 87.1 kg (192 lb)    History of present illness:  71  COPD on home O2 NASH /Liver cirrhosis with prior Hep encephalopathy c/b esophageal varices + TCP CKD III HTn Gout Htn Bipolar GERD afib vs MAT and h/o prolonged QT Gout  Recent admit 4/4-11/11/16 SIRS, Influenza B, COPD exacerb--Rx levaquin and tamiflu Also admit 3/9-3/111/8-no localizing source-CXR only ?Disease R side Admission 07/11/16-07/13/16 possible peritonitis  Hospital Course:  Fever CXR shows remnant of CAP-improved from 11/07/16 study Holding Abx PCt is 0.11 which is not concerning for overt infection Checking [place] abd for ascites--there is very small amounts of ascites noted Might need AI work-up as ESR is up-ESR elevated to 61 as well as CRP at 6.2  BC performed in ED 4/27 showed no growth to date  COPD stg 2-3 on home O2 2 liters baseline Cont albuterol prn, cont Atrovent 2.5 ml q 4 Cont Mucinex 1 tab bid  Cirrhosis 2/2 to [facility] + TCP Cont lasix 40-80 and prn Matolozone Consider  adding aldactone Cont BB Bisporlol zebeta 10 daily for 2/2 prevention of bleeding Cont lactulose 30g daily to prevent encephalopathy  Afib/MAT Cont bisoprolol  ckd 2-3 monitor creat Labs am  Bipolar Cont citalopram 40 qdm, xanax 0.5 od prn  Neuropathy vs Fibromyalgia Cont neurotnin 300 tid, flexeril 10 tid  Gout Cont allopruinol  S2 alert oriented no apparent distress eating drinking Chest is clinically clear with no added sound Abdomen is obese nontender nondistended  Discharge Exam: Vitals:   12/02/16 0541 12/02/16 1016  BP: (!) 117/41 (!) 132/50  Pulse: 78 81  Resp: 18 17  Temp: 97.4 F (36.3 C) 98.4 F (36.9 C)    Discharge Instructions   Discharge Instructions    Diet - low sodium heart healthy    Complete by:  As directed    Discharge instructions    Complete by:  As directed    Would recommned that you follow up with a rheumatologist as an OP See your primary md in about 1 week-you do not have an infection   Increase activity slowly    Complete by:  As directed      Current Discharge Medication List    CONTINUE these medications which have NOT CHANGED   Details  albuterol (PROVENTIL HFA;VENTOLIN HFA) 108 (90 Base) MCG/ACT inhaler Inhale 2 puffs into the lungs every 6 (six) hours as needed for wheezing or shortness of breath. Qty: 1 Inhaler, Refills: 2    albuterol (PROVENTIL) (2.5 MG/3ML) 0.083% nebulizer solution Take 2.5 mg by nebulization every  4 (four) hours as needed for wheezing or shortness of breath.     allopurinol (ZYLOPRIM) 300 MG tablet take 1 tablet by mouth daily for gout Qty: 90 tablet, Refills: 1    ALPRAZolam (XANAX) 0.5 MG tablet Take 0.5 mg by mouth daily as needed for anxiety or sleep.    aspirin EC 81 MG tablet Take 81 mg by mouth at bedtime.    bisoprolol (ZEBETA) 10 MG tablet Take 1 tablet (10 mg total) by mouth daily. Qty: 30 tablet, Refills: 0    Cholecalciferol (VITAMIN D3) 5000 units CAPS Take 5,000 Units by  mouth daily with breakfast.    citalopram (CELEXA) 40 MG tablet Take 1 tablet (40 mg total) by mouth daily.    cyclobenzaprine (FLEXERIL) 10 MG tablet Take 1 tablet (10 mg total) by mouth 3 (three) times daily as needed for muscle spasms. Qty: 90 tablet, Refills: 2    dextromethorphan-guaiFENesin (MUCINEX DM) 30-600 MG 12hr tablet Take 1 tablet by mouth 2 (two) times daily. Qty: 10 tablet, Refills: 0    ferrous sulfate 325 (65 FE) MG tablet Take 325 mg by mouth daily.     furosemide (LASIX) 80 MG tablet Take 0.5-1 tablets (40-80 mg total) by mouth See admin instructions. Take 1 tablet (80 mg) by mouth every morning and 1/2 tablet (40 mg) after supper    gabapentin (NEURONTIN) 300 MG capsule Take 1 capsule (300 mg total) by mouth 3 (three) times daily.    ipratropium (ATROVENT) 0.02 % nebulizer solution Take 2.5 mLs (0.5 mg total) by nebulization every 4 (four) hours as needed for wheezing or shortness of breath.    lactulose (CHRONULAC) 10 GM/15ML solution Take 30-45 g by mouth See admin instructions. One to two times a day to regulate ammonia levels    Magnesium 250 MG TABS Take 250 mg by mouth at bedtime.     meclizine (ANTIVERT) 25 MG tablet Take 1 tablet (25 mg total) by mouth 2 (two) times daily.   Associated Diagnoses: Benign paroxysmal positional vertigo, unspecified laterality    metolazone (ZAROXOLYN) 5 MG tablet Take 2.5 mg by mouth daily as needed (for a weight gain of 2-3 pounds in 24 hours).     ondansetron (ZOFRAN) 4 MG tablet Take 1 tablet (4 mg total) by mouth every 6 (six) hours as needed for nausea or vomiting. Qty: 30 tablet, Refills: 1    OXYGEN Inhale 2 L into the lungs continuous. 3 L with exertion    pantoprazole (PROTONIX) 40 MG tablet Take 1 tablet (40 mg total) by mouth 2 (two) times daily as needed for heartburn Qty: 180 tablet, Refills: 1    potassium chloride (K-DUR) 10 MEQ tablet Take 6 tablets (60 mEq total) by mouth daily. Qty: 180 tablet, Refills:  1    promethazine-dextromethorphan (PROMETHAZINE-DM) 6.25-15 MG/5ML syrup Take 2.5-5 mLs by mouth 2 (two) times daily as needed for cough.    traMADol (ULTRAM) 50 MG tablet take 1 TABLET BY MOUTH EVERY 8 HOURS Qty: 90 tablet, Refills: 1    triamcinolone cream (KENALOG) 0.1 % Apply 1 application topically 3 (three) times daily as needed for itching. Refills: 0      STOP taking these medications     acetaminophen (TYLENOL) 500 MG tablet        Allergies  Allergen Reactions  . Atorvastatin Other (See Comments)    Doesn't remember reaction  . Diphenhydramine Hcl (Sleep) Hives  . Hydrocodone-Acetaminophen Other (See Comments)    Reaction not remembered  .  Lopid [Gemfibrozil] Other (See Comments)    Doesn't remember reaction  . Loratadine Hives  . Lorazepam Hives  . Simvastatin Other (See Comments)    Reaction not remembered  . Sulfamethoxazole Hives  . Sulfonamide Derivatives Hives      The results of significant diagnostics from this hospitalization (including imaging, microbiology, ancillary and laboratory) are listed below for reference.    Significant Diagnostic Studies: Dg Chest 2 View  Result Date: 11/30/2016 CLINICAL DATA:  Cough and shortness of breath. EXAM: CHEST  2 VIEW COMPARISON:  11/07/2016.  10/31/2016.  01/21/2016 . FINDINGS: Mediastinum and hilar structures normal. Heart size normal. Low lung volumes with mild bibasilar infiltrates. Tiny stable nodule right upper lobe most likely tiny granuloma. No pleural effusion or pneumothorax. Prior cervical spine fusion. Right shoulder replacement . IMPRESSION: Low lung volumes with mild bibasilar infiltrates. Findings consist with mild bibasilar pneumonia. Findings are slightly more prominent on today's exam. Electronically Signed   By: Marcello Moores  Register   On: 11/30/2016 17:06   Dg Chest 2 View  Result Date: 11/07/2016 CLINICAL DATA:  Shortness of breath with productive cough EXAM: CHEST  2 VIEW COMPARISON:  10/31/2016  FINDINGS: Status post right shoulder replacement. Partially visualized hardware in the cervical spine. Patchy infiltrate at the lingula and right base. No effusion. Stable cardiomediastinal silhouette with atherosclerosis. No pneumothorax. Surgical clips in the right upper quadrant. Post augmentation changes of L1. IMPRESSION: Patchy infiltrates within the lingula and right lung base. Electronically Signed   By: Donavan Foil M.D.   On: 11/07/2016 19:23   US Abdomen Limited  Result Date: 12/01/2016 CLINICAL DATA:  Evaluate for ascites. EXAM: LIMITED ABDOMEN ULTRASOUND FOR ASCITES TECHNIQUE: Limited ultrasound survey for ascites was performed in all four abdominal quadrants. COMPARISON:  None. FINDINGS: Very mild ascites is seen in the right lower quadrant, left upper quadrant, and left lower quadrant. IMPRESSION: Mild ascites. Electronically Signed   By: Dorise Bullion III M.D   On: 12/01/2016 14:33    Microbiology: Recent Results (from the past 240 hour(s))  Blood culture (routine x 2)     Status: None (Preliminary result)   Collection Time: 11/30/16  5:57 PM  Result Value Ref Range Status   Specimen Description BLOOD LEFT WRIST  Final   Special Requests IN PEDIATRIC BOTTLE Blood Culture adequate volume  Final   Culture NO GROWTH < 24 HOURS  Final   Report Status PENDING  Incomplete  Blood culture (routine x 2)     Status: None (Preliminary result)   Collection Time: 11/30/16  6:10 PM  Result Value Ref Range Status   Specimen Description BLOOD LEFT ANTECUBITAL  Final   Special Requests IN PEDIATRIC BOTTLE Blood Culture adequate volume  Final   Culture NO GROWTH < 24 HOURS  Final   Report Status PENDING  Incomplete     Labs: Basic Metabolic Panel:  Recent Labs Lab 11/30/16 1644 11/30/16 1835 12/01/16 0348 12/02/16 0513  NA 134*  --  135 138  K 2.6*  --  3.3* 2.2*  CL 88*  --  91* 90*  CO2 35*  --  32 38*  GLUCOSE 231*  --  426* 219*  BUN 11  --  14 14  CREATININE 1.32*  --   1.47* 1.49*  CALCIUM 9.4  --  8.8* 8.8*  MG  --  1.9  --   --    Liver Function Tests:  Recent Labs Lab 11/30/16 1644 12/01/16 0348 12/02/16 0513  AST 41  55* 32  ALT 37 27 29  ALKPHOS 148* 137* 138*  BILITOT 1.4* 1.2 1.1  PROT 5.4* 4.7* 4.7*  ALBUMIN 3.0* 2.6* 2.5*   No results for input(s): LIPASE, AMYLASE in the last 168 hours.  Recent Labs Lab 11/30/16 1810  AMMONIA 55*   CBC:  Recent Labs Lab 11/30/16 1644 12/01/16 0348 12/02/16 0513  WBC 4.6 3.6* 3.1*  NEUTROABS 3.0  --  2.0  HGB 10.8* 9.7* 9.2*  HCT 34.0* 30.5* 29.3*  MCV 100.3* 100.7* 101.0*  PLT 99* 91* 85*   Cardiac Enzymes: No results for input(s): CKTOTAL, CKMB, CKMBINDEX, TROPONINI in the last 168 hours. BNP: BNP (last 3 results)  Recent Labs  01/19/16 0820 11/08/16 0434 11/30/16 1810  BNP 72.2 118.7* 144.7*    ProBNP (last 3 results) No results for input(s): PROBNP in the last 8760 hours.  CBG:  Recent Labs Lab 12/01/16 0805 12/01/16 1212 12/01/16 1736 12/01/16 2159 12/02/16 0749  GLUCAP 384* 315* 317* 344* 171*       Signed:  Nita Sells MD   [facility] 12/02/2016, 10:38 AM

## 2016-12-03 ENCOUNTER — Other Ambulatory Visit: Payer: Self-pay | Admitting: Internal Medicine

## 2016-12-03 ENCOUNTER — Telehealth: Payer: Self-pay | Admitting: *Deleted

## 2016-12-03 DIAGNOSIS — N390 Urinary tract infection, site not specified: Secondary | ICD-10-CM

## 2016-12-03 LAB — URINE CULTURE: Culture: 70000 — AB

## 2016-12-03 MED ORDER — AMOXICILLIN 500 MG PO CAPS: ORAL_CAPSULE | ORAL | 0 refills | Status: DC

## 2016-12-03 NOTE — Telephone Encounter (Signed)
Pt already has a appointment scheduled for 12/04/16 with Dr Crissie Sickles at 4.15 so added hosp fu 2 that visit

## 2016-12-04 ENCOUNTER — Encounter: Payer: Self-pay | Admitting: Internal Medicine

## 2016-12-04 ENCOUNTER — Other Ambulatory Visit: Payer: Self-pay | Admitting: *Deleted

## 2016-12-04 ENCOUNTER — Other Ambulatory Visit: Payer: Self-pay | Admitting: Internal Medicine

## 2016-12-04 ENCOUNTER — Ambulatory Visit (INDEPENDENT_AMBULATORY_CARE_PROVIDER_SITE_OTHER): Payer: PPO | Admitting: Internal Medicine

## 2016-12-04 VITALS — BP 112/64 | HR 64 | Temp 97.7°F | Resp 18 | Wt 187.0 lb

## 2016-12-04 DIAGNOSIS — K7460 Unspecified cirrhosis of liver: Secondary | ICD-10-CM

## 2016-12-04 DIAGNOSIS — E1122 Type 2 diabetes mellitus with diabetic chronic kidney disease: Secondary | ICD-10-CM

## 2016-12-04 DIAGNOSIS — N183 Chronic kidney disease, stage 3 (moderate): Secondary | ICD-10-CM | POA: Diagnosis not present

## 2016-12-04 DIAGNOSIS — N390 Urinary tract infection, site not specified: Secondary | ICD-10-CM

## 2016-12-04 DIAGNOSIS — Z79899 Other long term (current) drug therapy: Secondary | ICD-10-CM | POA: Diagnosis not present

## 2016-12-04 DIAGNOSIS — R509 Fever, unspecified: Secondary | ICD-10-CM

## 2016-12-04 DIAGNOSIS — J9611 Chronic respiratory failure with hypoxia: Secondary | ICD-10-CM | POA: Diagnosis not present

## 2016-12-04 DIAGNOSIS — J9612 Chronic respiratory failure with hypercapnia: Secondary | ICD-10-CM

## 2016-12-04 LAB — CBC WITH DIFFERENTIAL/PLATELET
BASOS ABS: 0 {cells}/uL (ref 0–200)
BASOS PCT: 0 %
EOS ABS: 120 {cells}/uL (ref 15–500)
Eosinophils Relative: 3 %
HCT: 34.6 % — ABNORMAL LOW (ref 35.0–45.0)
HEMOGLOBIN: 10.9 g/dL — AB (ref 11.7–15.5)
LYMPHS ABS: 840 {cells}/uL — AB (ref 850–3900)
Lymphocytes Relative: 21 %
MCH: 32.1 pg (ref 27.0–33.0)
MCHC: 31.5 g/dL — ABNORMAL LOW (ref 32.0–36.0)
MCV: 101.8 fL — AB (ref 80.0–100.0)
MONO ABS: 240 {cells}/uL (ref 200–950)
MPV: 10.7 fL (ref 7.5–12.5)
Monocytes Relative: 6 %
NEUTROS ABS: 2800 {cells}/uL (ref 1500–7800)
Neutrophils Relative %: 70 %
Platelets: 111 10*3/uL — ABNORMAL LOW (ref 140–400)
RBC: 3.4 MIL/uL — ABNORMAL LOW (ref 3.80–5.10)
RDW: 15.5 % — ABNORMAL HIGH (ref 11.0–15.0)
WBC: 4 10*3/uL (ref 3.8–10.8)

## 2016-12-04 MED ORDER — BISOPROLOL FUMARATE 10 MG PO TABS: 10.0000 mg | ORAL_TABLET | Freq: Every day | ORAL | 1 refills | Status: DC

## 2016-12-04 NOTE — Patient Instructions (Signed)
Cirrhosis Cirrhosis is long-term (chronic) liver injury. The liver is your largest internal organ, and it performs many functions. The liver converts food into energy, removes toxic material from your blood, makes important proteins, and absorbs necessary vitamins from your diet. If you have cirrhosis, it means many of your healthy liver cells have been replaced by scar tissue. This prevents blood from flowing through your liver, which makes it difficult for your liver to function. This scarring is not reversible, but treatment can prevent it from getting worse. What are the causes?  Nonalcoholic fatty liver disease. What increases the risk? You may have a higher risk of cirrhosis if you:  Have certain hepatitis viruses.  Abuse alcohol, especially if you are female.  Are overweight.  Share needles.  Have unprotected sex with someone who has hepatitis. What are the signs or symptoms? You may not have any signs and symptoms at first. Symptoms may not develop until the damage to your liver starts to get worse. Signs and symptoms of cirrhosis may include:  Tenderness in the right-upper part of your abdomen.  Weakness and tiredness (fatigue).  Loss of appetite.  Nausea.  Weight loss and muscle loss.  Itchiness.  Yellow skin and eyes (jaundice).  Buildup of fluid in the abdomen (ascites).  Swelling of the feet and ankles (edema).  Appearance of tiny blood vessels under the skin.  Mental confusion.  Easy bruising and bleeding. How is this diagnosed? Your health care provider may suspect cirrhosis based on your symptoms and medical history, especially if you have other medical conditions or a history of alcohol abuse. Your health care provider will do a physical exam to feel your liver and check for signs of cirrhosis. Your health care provider may perform other tests, including:  Blood tests to check:  Whether you have hepatitis B or C.  Kidney function.  Liver  function.  Imaging tests such as:  MRI or CT scan to look for changes seen in advanced cirrhosis.  Ultrasound to see if normal liver tissue is being replaced by scar tissue.  A procedure using a long needle to take a sample of liver tissue (biopsy) for examination under a microscope. Liver biopsy can confirm the diagnosis of cirrhosis. How is this treated? Treatment depends on how damaged your liver is and what caused the damage. Treatment may include treating cirrhosis symptoms or treating the underlying causes of the condition to try to slow the progression of the damage. Treatment may include:  Making lifestyle changes, such as:  Eating a healthy diet.  Restricting salt intake.  Maintaining a healthy weight.  Not abusing drugs or alcohol.  Taking medicines to:  Treat liver infections or other infections.  Control itching.  Reduce fluid buildup.  Reduce certain blood toxins.  Reduce risk of bleeding from enlarged blood vessels in the stomach or esophagus (varices).  If varices are causing bleeding problems, you may need treatment with a procedure that ties up the vessels causing them to fall off (band ligation).  If cirrhosis is causing your liver to fail, your health care provider may recommend a liver transplant.  Other treatments may be recommended depending on any complications of cirrhosis, such as liver-related kidney failure (hepatorenal syndrome). Follow these instructions at home:  Take medicines only as directed by your health care provider. Do not use drugs that are toxic to your liver. Ask your health care provider before taking any new medicines, including over-the-counter medicines.  Rest as needed.  Eat a well-balanced  diet. Ask your health care provider or dietitian for more information.  You may have to follow a low-salt diet or restrict your water intake as directed.  Do not drink alcohol. This is especially important if you are taking  acetaminophen.  Keep all follow-up visits as directed by your health care provider. This is important. Contact a health care provider if:  You have fatigue or weakness that is getting worse.  You develop swelling of the hands, feet, legs, or face.  You have a fever.  You develop loss of appetite.  You have nausea or vomiting.  You develop jaundice.  You develop easy bruising or bleeding. Get help right away if:  You vomit bright red blood or a material that looks like coffee grounds.  You have blood in your stools.  Your stools appear black and tarry.  You become confused.  You have chest pain or trouble breathing. This information is not intended to replace advice given to you by your health care provider. Make sure you discuss any questions you have with your health care provider. Document Released: 07/23/2005 Document Revised: 12/01/2015 Document Reviewed: 03/31/2014 Elsevier Interactive Patient Education  2017 [employer].

## 2016-12-04 NOTE — Progress Notes (Signed)
This very nice 71 y.o. MWF presents for post hospital follow up 4/27-29/2018 admitted with fever and post hospital d/c U/C returned (+) for Strep fecalis and she was started on Amoxil yesterday. Other problems include late or End stage Cirrhosis from NASH  with portal Hypertension and varices and she also has O2 dependent end stage COPD. Culture w/u for sepsis was negative til the U/C returned post discharge. Office clinical staff contacted her to schedule f/u evaluation. Hospital discharge meds were reviewed in detail and reconciled with patient & her caretaker husband. Husband closely monitors her daily weight to adjust her diuretics and also assures her lactulose dosing to maintain liquid stools because of her easily decompensating hepatic encephalopathy.   Patient also is followed with Hypertension, Hyperlipidemia, T2_NIDDM w/CKD3  and Vitamin D Deficiency.      Patient is treated for HTN & BP has been controlled at home. Today's BP is at goal. 112/64. Patient has had no complaints of any cardiac type chest pain, palpitations, dyspnea/orthopnea/PND, dizziness, claudication, or dependent edema.     Hyperlipidemia is controlled with diet & meds. Patient denies myalgias or other med SE's. Last Lipids were  Lab Results  Component Value Date   CHOL 183 08/29/2016   HDL 36 (L) 08/29/2016   LDLCALC 102 (H) 08/29/2016   TRIG 226 (H) 08/29/2016   CHOLHDL 5.1 (H) 08/29/2016      Also, the patient has history of T2_NIDDM and has had no symptoms of reactive hypoglycemia, diabetic polys, paresthesias or visual blurring.  Last A1c was  Lab Results  Component Value Date   HGBA1C 6.9 (H) 10/12/2016      Further, the patient also has history of Vitamin D Deficiency and supplements vitamin D without any suspected side-effects. Last vitamin D was   Lab Results  Component Value Date   VD25OH 42 04/19/2016   Current Outpatient Prescriptions on File Prior to Visit  Medication Sig  . albuterol  (PROVENTIL HFA;VENTOLIN HFA) 108 (90 Base) MCG/ACT inhaler Inhale 2 puffs into the lungs every 6 (six) hours as needed for wheezing or shortness of breath.  Marland Kitchen albuterol (PROVENTIL) (2.5 MG/3ML) 0.083% nebulizer solution Take 2.5 mg by nebulization every 4 (four) hours as needed for wheezing or shortness of breath.   . allopurinol (ZYLOPRIM) 300 MG tablet take 1 tablet by mouth daily for gout  . ALPRAZolam (XANAX) 0.5 MG tablet Take 0.5 mg by mouth daily as needed for anxiety or sleep.  Marland Kitchen amoxicillin (AMOXIL) 500 MG capsule Take 1 capsule 3 x day for UTI  . aspirin EC 81 MG tablet Take 81 mg by mouth at bedtime.  . Cholecalciferol (VITAMIN D3) 5000 units CAPS Take 5,000 Units by mouth daily with breakfast.  . citalopram (CELEXA) 40 MG tablet Take 1 tablet (40 mg total) by mouth daily.  . cyclobenzaprine (FLEXERIL) 10 MG tablet Take 1 tablet (10 mg total) by mouth 3 (three) times daily as needed for muscle spasms.  Marland Kitchen dextromethorphan-guaiFENesin (MUCINEX DM) 30-600 MG 12hr tablet Take 1 tablet by mouth 2 (two) times daily.  . ferrous sulfate 325 (65 FE) MG tablet Take 325 mg by mouth daily.   . furosemide (LASIX) 80 MG tablet Take 0.5-1 tablets (40-80 mg total) by mouth See admin instructions. Take 1 tablet (80 mg) by mouth every morning and 1/2 tablet (40 mg) after supper (Patient taking differently: Take 40-80 mg by mouth See admin instructions. 80 mg in the morning and 40 mg in the (  late) afternoon)  . gabapentin (NEURONTIN) 300 MG capsule Take 1 capsule (300 mg total) by mouth 3 (three) times daily.  Marland Kitchen ipratropium (ATROVENT) 0.02 % nebulizer solution Take 2.5 mLs (0.5 mg total) by nebulization every 4 (four) hours as needed for wheezing or shortness of breath.  . lactulose (CHRONULAC) 10 GM/15ML solution Take 30-45 g by mouth See admin instructions. One to two times a day to regulate ammonia levels  . Magnesium 250 MG TABS Take 250 mg by mouth at bedtime.   . meclizine (ANTIVERT) 25 MG tablet Take  1 tablet (25 mg total) by mouth 2 (two) times daily.  . metolazone (ZAROXOLYN) 5 MG tablet Take 2.5 mg by mouth daily as needed (for a weight gain of 2-3 pounds in 24 hours).   . ondansetron (ZOFRAN) 4 MG tablet Take 1 tablet (4 mg total) by mouth every 6 (six) hours as needed for nausea or vomiting.  . OXYGEN Inhale 2 L into the lungs continuous. 3 L with exertion  . pantoprazole (PROTONIX) 40 MG tablet Take 1 tablet (40 mg total) by mouth 2 (two) times daily as needed for heartburn (Patient taking differently: Take 40 mg by mouth daily before breakfast. )  . potassium chloride (K-DUR) 10 MEQ tablet Take 6 tablets (60 mEq total) by mouth daily. (Patient taking differently: Take 30 mEq by mouth in the morning then 20 mEq in the evening then 20 mEq at bedtime (take an additional 10 mEq if taking Metolazone))  . promethazine-dextromethorphan (PROMETHAZINE-DM) 6.25-15 MG/5ML syrup Take 2.5-5 mLs by mouth 2 (two) times daily as needed for cough.  . traMADol (ULTRAM) 50 MG tablet take 1 TABLET BY MOUTH EVERY 8 HOURS (Patient taking differently: Take 50 mg by mouth every 8 hours as needed for pain)  . triamcinolone cream (KENALOG) 0.1 % Apply 1 application topically 3 (three) times daily as needed for itching.   No current facility-administered medications on file prior to visit.    Allergies  Allergen Reactions  . Atorvastatin Other (See Comments)    Doesn't remember reaction  . Diphenhydramine Hcl (Sleep) Hives  . Hydrocodone-Acetaminophen Other (See Comments)    Reaction not remembered  . Lopid [Gemfibrozil] Other (See Comments)    Doesn't remember reaction  . Loratadine Hives  . Lorazepam Hives  . Simvastatin Other (See Comments)    Reaction not remembered  . Sulfamethoxazole Hives  . Sulfonamide Derivatives Hives   PMHx:   Past Medical History:  Diagnosis Date  . Asthma   . COPD (chronic obstructive pulmonary disease) ([place])   . Esophageal varices ([place])   . Fibromyalgia   . Gastritis    . Hepatic cirrhosis ([place])   . Hepatic encephalopathy ([place])   . Hyperlipidemia   . Hypertension   . IBS (irritable bowel syndrome)   . Splenomegaly   . Vitamin D deficiency    Immunization History  Administered Date(s) Administered  . Hep A / Hep B 11/05/2013, 11/12/2013, 12/07/2013, 12/31/2014  . Hepatitis B, adult 12/19/2015  . Hepatitis B, ped/adol 11/17/2015, 05/21/2016  . Influenza Split 05/26/2013, 06/17/2014  . Influenza, High Dose Seasonal PF 06/15/2015  . Influenza,inj,quad, With Preservative 05/22/2016  . Pneumococcal Conjugate-13 07/22/2015  . Pneumococcal Polysaccharide-23 08/28/2013  . Tdap 11/19/2012   Past Surgical History:  Procedure Laterality Date  . ABDOMINAL HYSTERECTOMY    . BACK SURGERY    . CHOLECYSTECTOMY    . ESOPHAGOGASTRODUODENOSCOPY  07/16/2012   Procedure: ESOPHAGOGASTRODUODENOSCOPY (EGD);  Surgeon: Inda Castle, MD;  Location:  [facility];  Service: Endoscopy;  Laterality: N/A;  . GASTRIC VARICES BANDING  07/16/2012   Procedure: GASTRIC VARICES BANDING;  Surgeon: Inda Castle, MD;  Location: [facility];  Service: Endoscopy;  Laterality: N/A;  . HIP ARTHROPLASTY Bilateral   . IR GENERIC HISTORICAL  06/25/2016   IR RADIOLOGIST EVAL & MGMT 06/25/2016 [facility]  . IR GENERIC HISTORICAL  06/27/2016   IR KYPHO LUMBAR INC FX REDUCE BONE BX UNI/BIL CANNULATION INC/IMAGING 06/27/2016 Luanne Bras, MD [facility]  . IR GENERIC HISTORICAL  07/17/2016   IR RADIOLOGIST EVAL & MGMT 07/17/2016 [facility]  . NECK SURGERY    . SHOULDER SURGERY Right    FHx:    Reviewed / unchanged  SHx:    Reviewed / unchanged  Systems Review:  Constitutional: Denies fever, chills, wt changes, headaches, insomnia, fatigue, night sweats, change in appetite. Eyes: Denies redness, blurred vision, diplopia, discharge, itchy, watery eyes.  ENT: Denies discharge, congestion, post nasal drip, epistaxis, sore throat, earache, hearing loss, dental pain,  tinnitus, vertigo, sinus pain, snoring.  CV: Denies chest pain, palpitations, irregular heartbeat, syncope, dyspnea, diaphoresis, orthopnea, PND, claudication or edema. Respiratory: denies cough, dyspnea, DOE, pleurisy, hoarseness, laryngitis, wheezing.  Gastrointestinal: Denies dysphagia, odynophagia, heartburn, reflux, water brash, abdominal pain or cramps, nausea, vomiting, bloating, diarrhea, constipation, hematemesis, melena, hematochezia  or hemorrhoids. Genitourinary: Denies dysuria, frequency, urgency, nocturia, hesitancy, discharge, hematuria or flank pain. Musculoskeletal: Denies arthralgias, myalgias, stiffness, jt. swelling, pain, limping or strain/sprain.  Skin: Denies pruritus, rash, hives, warts, acne, eczema or change in skin lesion(s). Neuro: No weakness, tremor, incoordination, spasms, paresthesia or pain. Psychiatric: Denies confusion, memory loss or sensory loss. Endo: Denies change in weight, skin or hair change.  Heme/Lymph: No excessive bleeding, bruising or enlarged lymph nodes.  Physical Exam  BP 112/64   Pulse 64   Temp 97.7 F (36.5 C)   Resp 18   Wt 187 lb (84.8 kg)   BMI 34.20 kg/m  Nasal O2 at 2 lit /min and O2 sat is 94 % .  Appears over nourished, well groomed  and in no distress. Bloated facies almost cushenoid  Eyes: PERRLA, EOMs, conjunctiva no swelling or erythema. Sinuses: No frontal/maxillary tenderness ENT/Mouth: EAC's clear, TM's nl w/o erythema, bulging. Nares clear w/o erythema, swelling, exudates. Oropharynx clear without erythema or exudates. Oral hygiene is good. Tongue normal, non obstructing. Hearing intact.  Neck: Supple. Thyroid nl. Car 2+/2+ without bruits, nodes or JVD. Chest: Respirations decreased with BS clear & equal w/o rales, rhonchi, wheezing or stridor.  Cor: Heart sounds normal w/ regular rate and rhythm without sig. murmurs, gallops, clicks or rubs. Peripheral pulses normal and equal  without edema.  Abdomen: Soft,  protuberant & bowel sounds normal. Non-tender w/o guarding, rebound, hernias, masses or organomegaly.  Lymphatics: Unremarkable.  Musculoskeletal: Full ROM all peripheral extremities, joint stability, 5/5 strength and normal gait.  Skin: Warm, dry without exposed rashes, lesions or ecchymosis apparent.  Neuro: Cranial nerves intact, reflexes equal bilaterally. Sensory-motor testing grossly intact. Tendon reflexes grossly intact.  Pysch: Alert & oriented x 3.  Insight and judgement nl & appropriate. No ideations.  Assessment and Plan:  1. Fever of unknown origin (FUO)  - CBC with Differential/Platelet  2. Urinary tract infection without hematuria  - Continue culture guided Amoxil x 2 weeks.   3. Cirrhosis of liver without ascites, unspecified hepatic cirrhosis type ([place])  - AMMONIA  4. Chronic respiratory failure with hypoxia and hypercapnia (HCC)   5. Type 2  diabetes mellitus with stage 3 chronic kidney disease (HCC)  - Continue diet, exercise, lifestyle modifications.  - Monitor appropriate labs. - Continue supplementation. - BASIC METABOLIC PANEL WITH GFR  6. Medication management  - CBC with Differential/Platelet - BASIC METABOLIC PANEL WITH GFR - Hepatic function panel - Magnesium - AMMONIA      Discussed  regular exercise, BP monitoring, weight control to achieve/maintain BMI less than 25 and discussed med and SE's. Recommended labs to assess and monitor clinical status with further disposition pending results of labs. Over 30 minutes of exam, counseling, chart review was performed.

## 2016-12-05 ENCOUNTER — Other Ambulatory Visit: Payer: Self-pay | Admitting: Internal Medicine

## 2016-12-05 DIAGNOSIS — K7469 Other cirrhosis of liver: Secondary | ICD-10-CM

## 2016-12-05 DIAGNOSIS — K7200 Acute and subacute hepatic failure without coma: Secondary | ICD-10-CM

## 2016-12-05 LAB — BASIC METABOLIC PANEL WITH GFR
BUN: 17 mg/dL (ref 7–25)
CHLORIDE: 90 mmol/L — AB (ref 98–110)
CO2: 36 mmol/L — AB (ref 20–31)
Calcium: 8.5 mg/dL — ABNORMAL LOW (ref 8.6–10.4)
Creat: 1.3 mg/dL — ABNORMAL HIGH (ref 0.60–0.93)
GFR, Est African American: 48 mL/min — ABNORMAL LOW (ref >=60)
GFR, Est Non African American: 42 mL/min — ABNORMAL LOW (ref >=60)
Glucose, Bld: 258 mg/dL — ABNORMAL HIGH (ref 65–99)
POTASSIUM: 3.2 mmol/L — AB (ref 3.5–5.3)
Sodium: 139 mmol/L (ref 135–146)

## 2016-12-05 LAB — CULTURE, BLOOD (ROUTINE X 2)
Culture: NO GROWTH
Culture: NO GROWTH
Special Requests: ADEQUATE
Special Requests: ADEQUATE

## 2016-12-05 LAB — HEPATIC FUNCTION PANEL
ALBUMIN: 3.1 g/dL — AB (ref 3.6–5.1)
ALK PHOS: 164 U/L — AB (ref 33–130)
ALT: 29 U/L (ref 6–29)
AST: 45 U/L — AB (ref 10–35)
BILIRUBIN DIRECT: 0.4 mg/dL — AB (ref <=0.2)
BILIRUBIN TOTAL: 1.2 mg/dL (ref 0.2–1.2)
Indirect Bilirubin: 0.8 mg/dL (ref 0.2–1.2)
Total Protein: 5.2 g/dL — ABNORMAL LOW (ref 6.1–8.1)

## 2016-12-05 LAB — AMMONIA: Ammonia: 122 umol/L — ABNORMAL HIGH (ref <=47)

## 2016-12-05 LAB — MAGNESIUM: MAGNESIUM: 1.9 mg/dL (ref 1.5–2.5)

## 2016-12-06 DIAGNOSIS — N183 Chronic kidney disease, stage 3 (moderate): Secondary | ICD-10-CM | POA: Diagnosis not present

## 2016-12-06 DIAGNOSIS — J440 Chronic obstructive pulmonary disease with acute lower respiratory infection: Secondary | ICD-10-CM | POA: Diagnosis not present

## 2016-12-06 DIAGNOSIS — I129 Hypertensive chronic kidney disease with stage 1 through stage 4 chronic kidney disease, or unspecified chronic kidney disease: Secondary | ICD-10-CM | POA: Diagnosis not present

## 2016-12-06 DIAGNOSIS — M6281 Muscle weakness (generalized): Secondary | ICD-10-CM | POA: Diagnosis not present

## 2016-12-06 DIAGNOSIS — J9611 Chronic respiratory failure with hypoxia: Secondary | ICD-10-CM | POA: Diagnosis not present

## 2016-12-06 DIAGNOSIS — Z87891 Personal history of nicotine dependence: Secondary | ICD-10-CM | POA: Diagnosis not present

## 2016-12-06 DIAGNOSIS — K7581 Nonalcoholic steatohepatitis (NASH): Secondary | ICD-10-CM | POA: Diagnosis not present

## 2016-12-06 DIAGNOSIS — Z7982 Long term (current) use of aspirin: Secondary | ICD-10-CM | POA: Diagnosis not present

## 2016-12-06 DIAGNOSIS — E1122 Type 2 diabetes mellitus with diabetic chronic kidney disease: Secondary | ICD-10-CM | POA: Diagnosis not present

## 2016-12-06 DIAGNOSIS — M545 Low back pain: Secondary | ICD-10-CM | POA: Diagnosis not present

## 2016-12-07 ENCOUNTER — Emergency Department (HOSPITAL_COMMUNITY): Payer: PPO

## 2016-12-07 ENCOUNTER — Emergency Department (HOSPITAL_COMMUNITY)
Admission: EM | Admit: 2016-12-07 | Discharge: 2016-12-07 | Disposition: A | Discharge status: Home / Self Care | Payer: PPO | Attending: Emergency Medicine | Admitting: Emergency Medicine

## 2016-12-07 ENCOUNTER — Encounter (HOSPITAL_COMMUNITY): Payer: Self-pay | Admitting: *Deleted

## 2016-12-07 DIAGNOSIS — S0001XA Abrasion of scalp, initial encounter: Secondary | ICD-10-CM | POA: Diagnosis not present

## 2016-12-07 DIAGNOSIS — N183 Chronic kidney disease, stage 3 (moderate): Secondary | ICD-10-CM | POA: Insufficient documentation

## 2016-12-07 DIAGNOSIS — Y929 Unspecified place or not applicable: Secondary | ICD-10-CM | POA: Insufficient documentation

## 2016-12-07 DIAGNOSIS — Z79899 Other long term (current) drug therapy: Secondary | ICD-10-CM | POA: Diagnosis not present

## 2016-12-07 DIAGNOSIS — Z7982 Long term (current) use of aspirin: Secondary | ICD-10-CM | POA: Insufficient documentation

## 2016-12-07 DIAGNOSIS — I129 Hypertensive chronic kidney disease with stage 1 through stage 4 chronic kidney disease, or unspecified chronic kidney disease: Secondary | ICD-10-CM | POA: Diagnosis not present

## 2016-12-07 DIAGNOSIS — E876 Hypokalemia: Secondary | ICD-10-CM | POA: Diagnosis not present

## 2016-12-07 DIAGNOSIS — Z87891 Personal history of nicotine dependence: Secondary | ICD-10-CM | POA: Diagnosis not present

## 2016-12-07 DIAGNOSIS — W1830XA Fall on same level, unspecified, initial encounter: Secondary | ICD-10-CM | POA: Diagnosis not present

## 2016-12-07 DIAGNOSIS — S0990XA Unspecified injury of head, initial encounter: Secondary | ICD-10-CM

## 2016-12-07 DIAGNOSIS — E119 Type 2 diabetes mellitus without complications: Secondary | ICD-10-CM | POA: Diagnosis not present

## 2016-12-07 DIAGNOSIS — W19XXXA Unspecified fall, initial encounter: Secondary | ICD-10-CM

## 2016-12-07 DIAGNOSIS — S299XXA Unspecified injury of thorax, initial encounter: Secondary | ICD-10-CM | POA: Diagnosis not present

## 2016-12-07 DIAGNOSIS — Y939 Activity, unspecified: Secondary | ICD-10-CM | POA: Insufficient documentation

## 2016-12-07 DIAGNOSIS — R079 Chest pain, unspecified: Secondary | ICD-10-CM | POA: Diagnosis not present

## 2016-12-07 DIAGNOSIS — J449 Chronic obstructive pulmonary disease, unspecified: Secondary | ICD-10-CM | POA: Insufficient documentation

## 2016-12-07 DIAGNOSIS — Y999 Unspecified external cause status: Secondary | ICD-10-CM | POA: Diagnosis not present

## 2016-12-07 DIAGNOSIS — S199XXA Unspecified injury of neck, initial encounter: Secondary | ICD-10-CM | POA: Diagnosis not present

## 2016-12-07 LAB — CBC WITH DIFFERENTIAL/PLATELET
BASOS ABS: 0 10*3/uL (ref 0.0–0.1)
Basophils Relative: 0 %
EOS ABS: 0.1 10*3/uL (ref 0.0–0.7)
Eosinophils Relative: 1 %
HCT: 29.3 % — ABNORMAL LOW (ref 36.0–46.0)
HEMOGLOBIN: 9.4 g/dL — AB (ref 12.0–15.0)
Lymphocytes Relative: 20 %
Lymphs Abs: 0.9 10*3/uL (ref 0.7–4.0)
MCH: 31.9 pg (ref 26.0–34.0)
MCHC: 32.1 g/dL (ref 30.0–36.0)
MCV: 99.3 fL (ref 78.0–100.0)
Monocytes Absolute: 0.3 10*3/uL (ref 0.1–1.0)
Monocytes Relative: 7 %
NEUTROS PCT: 72 %
Neutro Abs: 3.3 10*3/uL (ref 1.7–7.7)
Platelets: 98 10*3/uL — ABNORMAL LOW (ref 150–400)
RBC: 2.95 MIL/uL — AB (ref 3.87–5.11)
RDW: 15.2 % (ref 11.5–15.5)
WBC: 4.6 10*3/uL (ref 4.0–10.5)

## 2016-12-07 LAB — CBG MONITORING, ED: GLUCOSE-CAPILLARY: 302 mg/dL — AB (ref 65–99)

## 2016-12-07 LAB — BASIC METABOLIC PANEL
Anion gap: 9 (ref 5–15)
BUN: 11 mg/dL (ref 6–20)
CALCIUM: 8.4 mg/dL — AB (ref 8.9–10.3)
CO2: 32 mmol/L (ref 22–32)
CREATININE: 1.18 mg/dL — AB (ref 0.44–1.00)
Chloride: 87 mmol/L — ABNORMAL LOW (ref 101–111)
GFR, EST AFRICAN AMERICAN: 53 mL/min — AB (ref >60)
GFR, EST NON AFRICAN AMERICAN: 46 mL/min — AB (ref >60)
Glucose, Bld: 299 mg/dL — ABNORMAL HIGH (ref 65–99)
Potassium: 2.8 mmol/L — ABNORMAL LOW (ref 3.5–5.1)
SODIUM: 128 mmol/L — AB (ref 135–145)

## 2016-12-07 LAB — PROTIME-INR
INR: 1.09
PROTHROMBIN TIME: 14.2 s (ref 11.4–15.2)

## 2016-12-07 LAB — AMMONIA: Ammonia: 80 umol/L — ABNORMAL HIGH (ref 9–35)

## 2016-12-07 MED ORDER — POTASSIUM CHLORIDE CRYS ER 20 MEQ PO TBCR
40.0000 meq | EXTENDED_RELEASE_TABLET | Freq: Once | ORAL | Status: AC
  Administered 2016-12-07: 40 meq via ORAL
  Filled 2016-12-07: qty 2

## 2016-12-07 MED ORDER — TRAMADOL HCL 50 MG PO TABS
50.0000 mg | ORAL_TABLET | Freq: Once | ORAL | Status: AC
  Administered 2016-12-07: 50 mg via ORAL
  Filled 2016-12-07: qty 1

## 2016-12-07 NOTE — ED Triage Notes (Signed)
Pt reports tripping and falling today, hit back of her head, has laceration, no bleeding noted at triage. Also reports having right side rib pain.

## 2016-12-07 NOTE — Discharge Instructions (Signed)
There were no serious injuries, from the fall.  The wound on your head, clean it well with soap and water once or twice a day then apply antibiotic ointment.  For the hypokalemia, take 80 mEq of potassium, each day for at least 4 days.  After that have your doctor check your potassium level.  Also, try to eat foods which contain potassium.  Return here, if needed, for problems.

## 2016-12-07 NOTE — ED Provider Notes (Signed)
[facility] DEPT Provider Note   CSN: [id number] Arrival date & time: 12/07/16  1409     History   Chief Complaint Chief Complaint  Patient presents with  . Fall  . Head Injury    HPI [name] is a 71 y.o. female.  She was at home today walking with her walker when she fell backwards striking her head.  There was no loss of consciousness.  Her husband help cleaned her up, and subsequently took her to a hairdresser for a "haircut".  Following the incident she was able to walk.  She has not had any vomiting today.  She has been able to eat.  There is no prodrome prior to the fall and she thinks she just "lost my balance".  She is taking her usual medications, as directed.  There are no other known modifying factors.  HPI  Past Medical History:  Diagnosis Date  . Asthma   . COPD (chronic obstructive pulmonary disease) ([place])   . Esophageal varices ([place])   . Fibromyalgia   . Gastritis   . Hepatic cirrhosis ([place])   . Hepatic encephalopathy ([place])   . Hyperlipidemia   . Hypertension   . IBS (irritable bowel syndrome)   . Splenomegaly   . Vitamin D deficiency     Patient Active Problem List   Diagnosis Date Noted  . COPD mixed type ([place]) 11/30/2016  . Obesity (BMI 30-39.9) 10/12/2016  . Fever 10/12/2016  . Hyperammonemia ([place]) 10/12/2016  . Atrial tachycardia, multifocal ([place])   . QT prolongation 03/05/2016  . Acute renal failure superimposed on stage 3 chronic kidney disease ([place])   . Hyponatremia   . Acute on chronic respiratory failure ([place]) 01/19/2016  . DM type 2 causing CKD stage 3 ([place]) 10/30/2015  . Hepatic encephalopathy ([place]) 09/21/2015  . Hypokalemia 09/21/2015  . COPD with acute exacerbation ([place]) 08/30/2015  . CKD stage 3 due to type 2 diabetes mellitus ([place]) 08/16/2015  . Acute on chronic respiratory failure with hypoxia ([place]) 08/07/2015  . Thrombocytopenia ([place]) 08/07/2015  . Venous insufficiency 07/26/2015  . Encounter for Medicare annual  wellness exam 02/16/2015  . Medication management 12/14/2013  . Essential hypertension   . Hyperlipidemia   . GERD   . Vitamin D deficiency   . IBS   . Fibromyalgia   . Esophageal varices ([place]) 07/08/2012  . COLONIC POLYPS 11/11/2008  . Anemia 05/28/2008  . Hepatic cirrhosis ([place]) 05/28/2008  . Esophagitis 05/27/2008  . Diverticulosis of large intestine 05/27/2008    Past Surgical History:  Procedure Laterality Date  . ABDOMINAL HYSTERECTOMY    . BACK SURGERY    . CHOLECYSTECTOMY    . ESOPHAGOGASTRODUODENOSCOPY  07/16/2012   Procedure: ESOPHAGOGASTRODUODENOSCOPY (EGD);  Surgeon: Inda Castle, MD;  Location: [facility];  Service: Endoscopy;  Laterality: N/A;  . GASTRIC VARICES BANDING  07/16/2012   Procedure: GASTRIC VARICES BANDING;  Surgeon: Inda Castle, MD;  Location: [facility];  Service: Endoscopy;  Laterality: N/A;  . HIP ARTHROPLASTY Bilateral   . IR GENERIC HISTORICAL  06/25/2016   IR RADIOLOGIST EVAL & MGMT 06/25/2016 [facility]  . IR GENERIC HISTORICAL  06/27/2016   IR KYPHO LUMBAR INC FX REDUCE BONE BX UNI/BIL CANNULATION INC/IMAGING 06/27/2016 Luanne Bras, MD [facility]  . IR GENERIC HISTORICAL  07/17/2016   IR RADIOLOGIST EVAL & MGMT 07/17/2016 [facility]  . NECK SURGERY    . SHOULDER SURGERY Right     OB History    No  data available       Home Medications    Prior to Admission medications   Medication Sig Start Date End Date Taking? Authorizing Provider  albuterol (PROVENTIL HFA;VENTOLIN HFA) 108 (90 Base) MCG/ACT inhaler Inhale 2 puffs into the lungs every 6 (six) hours as needed for wheezing or shortness of breath. 05/27/16  Yes Ward, Kristen N, DO  albuterol (PROVENTIL) (2.5 MG/3ML) 0.083% nebulizer solution Take 2.5 mg by nebulization every 4 (four) hours as needed for wheezing or shortness of breath.  04/11/16  Yes [provider]  allopurinol (ZYLOPRIM) 300 MG tablet take 1 tablet by mouth daily for gout 10/14/16  Yes  Rama, Venetia Maxon, MD  ALPRAZolam Duanne Moron) 0.5 MG tablet Take 0.5 mg by mouth daily as needed for anxiety or sleep.   Yes [provider]  amoxicillin (AMOXIL) 500 MG capsule Take 1 capsule 3 x day for UTI 12/03/16 12/13/16 Yes Unk Pinto, MD  aspirin EC 81 MG tablet Take 81 mg by mouth at bedtime.   Yes [provider]  bisoprolol (ZEBETA) 10 MG tablet Take 1 tablet (10 mg total) by mouth daily. 12/04/16  Yes Unk Pinto, MD  Cholecalciferol (VITAMIN D3) 5000 units CAPS Take 5,000 Units by mouth daily with breakfast.   Yes [provider]  citalopram (CELEXA) 40 MG tablet Take 1 tablet (40 mg total) by mouth daily. 10/14/16  Yes Rama, Venetia Maxon, MD  cyclobenzaprine (FLEXERIL) 10 MG tablet Take 1 tablet (10 mg total) by mouth 3 (three) times daily as needed for muscle spasms. 03/06/16  Yes Unk Pinto, MD  dextromethorphan-guaiFENesin [facility] DM) 30-600 MG 12hr tablet Take 1 tablet by mouth 2 (two) times daily. 11/11/16  Yes Dhungel, Nishant, MD  ferrous sulfate 325 (65 FE) MG tablet Take 325 mg by mouth daily.    Yes [provider]  furosemide (LASIX) 80 MG tablet Take 0.5-1 tablets (40-80 mg total) by mouth See admin instructions. Take 1 tablet (80 mg) by mouth every morning and 1/2 tablet (40 mg) after supper Patient taking differently: Take 40-80 mg by mouth See admin instructions. 80 mg in the morning and 40 mg in the (late) afternoon 10/14/16  Yes Rama, Venetia Maxon, MD  gabapentin (NEURONTIN) 300 MG capsule Take 1 capsule (300 mg total) by mouth 3 (three) times daily. 10/14/16  Yes Rama, Venetia Maxon, MD  ipratropium (ATROVENT) 0.02 % nebulizer solution Take 2.5 mLs (0.5 mg total) by nebulization every 4 (four) hours as needed for wheezing or shortness of breath. 10/14/16  Yes Rama, Venetia Maxon, MD  lactulose (CHRONULAC) 10 GM/15ML solution Take 30-45 g by mouth See admin instructions. One to two times a day to regulate ammonia levels   Yes [provider]  Magnesium 250 MG TABS Take 250 mg by mouth at bedtime.    Yes [provider]  meclizine (ANTIVERT) 25 MG tablet Take 1 tablet (25 mg total) by mouth 2 (two) times daily. 10/14/16  Yes Rama, Venetia Maxon, MD  metolazone (ZAROXOLYN) 5 MG tablet Take 2.5 mg by mouth daily as needed (for a weight gain of 2-3 pounds in 24 hours).  10/09/16  Yes [provider]  ondansetron (ZOFRAN) 4 MG tablet Take 1 tablet (4 mg total) by mouth every 6 (six) hours as needed for nausea or vomiting. 10/16/16  Yes Vicie Mutters, PA-C  OXYGEN Inhale 2 L into the lungs continuous. 3 L with exertion   Yes [provider]  pantoprazole (PROTONIX) 40 MG tablet  Take 1 tablet (40 mg total) by mouth 2 (two) times daily as needed for heartburn Patient taking differently: Take 40 mg by mouth daily before breakfast.  10/14/16  Yes Rama, Venetia Maxon, MD  potassium chloride (K-DUR) 10 MEQ tablet Take 6 tablets (60 mEq total) by mouth daily. Patient taking differently: Take 30 mEq by mouth in the morning then 20 mEq in the evening then 20 mEq at bedtime (take an additional 10 mEq if taking Metolazone) 11/19/16  Yes Unk Pinto, MD  promethazine-dextromethorphan (PROMETHAZINE-DM) 6.25-15 MG/5ML syrup Take 2.5-5 mLs by mouth 2 (two) times daily as needed for cough.   Yes [provider]  traMADol (ULTRAM) 50 MG tablet take 1 TABLET BY MOUTH EVERY 8 HOURS Patient taking differently: Take 50 mg by mouth every 8 hours as needed for pain 10/24/16  Yes Vicie Mutters, PA-C  triamcinolone cream (KENALOG) 0.1 % Apply 1 application topically 3 (three) times daily as needed for itching. 03/22/16  Yes [provider]    Family History Family History  Problem Relation Age of Onset  . Diabetes Brother     deceased  . Heart disease Sister     A Fib  . Heart disease Mother     CHF  . Atrial fibrillation Sister   . Colon cancer Neg Hx     Social History Social History  Substance  Use Topics  . Smoking status: Former Smoker    Packs/day: 1.00    Years: 50.00    Types: Cigarettes    Quit date: 11/05/2014  . Smokeless tobacco: Never Used     Comment: Quit April 2016  . Alcohol use No     Allergies   Atorvastatin; Diphenhydramine hcl (sleep); Hydrocodone-acetaminophen; Lopid [gemfibrozil]; Loratadine; Lorazepam; Simvastatin; Sulfamethoxazole; and Sulfonamide derivatives   Review of Systems Review of Systems  All other systems reviewed and are negative.    Physical Exam Updated Vital Signs BP (!) 128/56   Pulse 77   Temp 98.8 F (37.1 C) (Oral)   Resp (!) 22   SpO2 100%   Physical Exam  Constitutional: She is oriented to person, place, and time. She appears well-developed.  Elderly, frail  HENT:  Head: Normocephalic.  Contusion, and abrasion, right parietal.  No associated crepitation or deformity.  Eyes: Conjunctivae and EOM are normal. Pupils are equal, round, and reactive to light.  Neck: Normal range of motion and phonation normal. Neck supple.  Cardiovascular: Normal rate and regular rhythm.   Pulmonary/Chest: Effort normal and breath sounds normal. No respiratory distress. She exhibits no tenderness.  Abdominal: Soft. She exhibits no distension. There is no tenderness. There is no guarding.  Musculoskeletal: Normal range of motion.  Neurological: She is alert and oriented to person, place, and time. She exhibits normal muscle tone.  Skin: Skin is warm and dry.  Psychiatric: She has a normal mood and affect. Her behavior is normal. Judgment and thought content normal.  Nursing note and vitals reviewed.    ED Treatments / Results  Labs (all labs ordered are listed, but only abnormal results are displayed) Labs Reviewed  BASIC METABOLIC PANEL - Abnormal; Notable for the following:       Result Value   Sodium 128 (*)    Potassium 2.8 (*)    Chloride 87 (*)    Glucose, Bld 299 (*)    Creatinine, Ser 1.18 (*)    Calcium 8.4 (*)    GFR  calc non Af Amer 46 (*)  GFR calc Af Amer 53 (*)    All other components within normal limits  CBC WITH DIFFERENTIAL/PLATELET - Abnormal; Notable for the following:    RBC 2.95 (*)    Hemoglobin 9.4 (*)    HCT 29.3 (*)    Platelets 98 (*)    All other components within normal limits  AMMONIA - Abnormal; Notable for the following:    Ammonia 80 (*)    All other components within normal limits  CBG MONITORING, ED - Abnormal; Notable for the following:    Glucose-Capillary 302 (*)    All other components within normal limits  PROTIME-INR    EKG  EKG Interpretation None       Radiology Dg Ribs Unilateral W/chest Right  Result Date: 12/07/2016 CLINICAL DATA:  Fall today with chest pain.  Initial encounter. EXAM: RIGHT RIBS AND CHEST - 3+ VIEW COMPARISON:  Chest x-ray 11/30/2016 FINDINGS: Remote posterior right seventh, eighth, ninth rib fractures. No evidence of acute fracture. There is no edema, consolidation, effusion, or pneumothorax. Chronic bronchitic markings. Normal heart size and aortic contours. Atherosclerosis. Right glenohumeral arthroplasty and cervical spine fusion. IMPRESSION: 1. No acute finding. 2. Chronic bronchitic markings. Electronically Signed   By: Monte Fantasia M.D.   On: 12/07/2016 15:49   Ct Head Wo Contrast  Result Date: 12/07/2016 CLINICAL DATA:  Fall with right-sided head injury EXAM: CT HEAD WITHOUT CONTRAST CT CERVICAL SPINE WITHOUT CONTRAST TECHNIQUE: Multidetector CT imaging of the head and cervical spine was performed following the standard protocol without intravenous contrast. Multiplanar CT image reconstructions of the cervical spine were also generated. COMPARISON:  09/21/2015, CT neck 09/02/2013 FINDINGS: CT HEAD FINDINGS Brain: No evidence of acute infarction, hemorrhage, hydrocephalus, extra-axial collection or mass lesion/mass effect. Vascular: No hyperdense vessel or unexpected calcification. Skull: Normal. Negative for fracture or focal lesion.  Trace fluid in the inferior mastoids Sinuses/Orbits: Minimal mucosal thickening in the ethmoid sinuses. No acute orbital abnormality. Other: None CT CERVICAL SPINE FINDINGS Alignment: Minimal retrolisthesis of C3 on C4. Facet alignment is maintained. Skull base and vertebrae: Craniovertebral junction is intact. No fracture is visualized Soft tissues and spinal canal: No prevertebral fluid or swelling. No visible canal hematoma. Disc levels: Status post C4 through C6 anterior plate and screw fixation with interbody devices. Posterior decompression C4 through C6. No change in appearance. Upper chest: Lung apices are clear. There is hyperlucency in the right apex which may be due to motion artifact or mild emphysematous change. Other: Carotid artery calcification. IMPRESSION: 1. No CT evidence for acute intracranial abnormality. 2. Postsurgical changes from C4 through C6. No acute fracture visualized. Electronically Signed   By: Donavan Foil M.D.   On: 12/07/2016 21:32   Ct Cervical Spine Wo Contrast  Result Date: 12/07/2016 CLINICAL DATA:  Fall with right-sided head injury EXAM: CT HEAD WITHOUT CONTRAST CT CERVICAL SPINE WITHOUT CONTRAST TECHNIQUE: Multidetector CT imaging of the head and cervical spine was performed following the standard protocol without intravenous contrast. Multiplanar CT image reconstructions of the cervical spine were also generated. COMPARISON:  09/21/2015, CT neck 09/02/2013 FINDINGS: CT HEAD FINDINGS Brain: No evidence of acute infarction, hemorrhage, hydrocephalus, extra-axial collection or mass lesion/mass effect. Vascular: No hyperdense vessel or unexpected calcification. Skull: Normal. Negative for fracture or focal lesion. Trace fluid in the inferior mastoids Sinuses/Orbits: Minimal mucosal thickening in the ethmoid sinuses. No acute orbital abnormality. Other: None CT CERVICAL SPINE FINDINGS Alignment: Minimal retrolisthesis of C3 on C4. Facet alignment is maintained. Skull base and  vertebrae: Craniovertebral junction is intact. No fracture is visualized Soft tissues and spinal canal: No prevertebral fluid or swelling. No visible canal hematoma. Disc levels: Status post C4 through C6 anterior plate and screw fixation with interbody devices. Posterior decompression C4 through C6. No change in appearance. Upper chest: Lung apices are clear. There is hyperlucency in the right apex which may be due to motion artifact or mild emphysematous change. Other: Carotid artery calcification. IMPRESSION: 1. No CT evidence for acute intracranial abnormality. 2. Postsurgical changes from C4 through C6. No acute fracture visualized. Electronically Signed   By: Donavan Foil M.D.   On: 12/07/2016 21:32    Procedures Procedures (including critical care time)  Medications Ordered in ED Medications  potassium chloride SA (K-DUR,KLOR-CON) CR tablet 40 mEq (not administered)  traMADol (ULTRAM) tablet 50 mg (50 mg Oral Given 12/07/16 2141)     Initial Impression / Assessment and Plan / ED Course  I have reviewed the triage vital signs and the nursing notes.  Pertinent labs & imaging results that were available during my care of the patient were reviewed by me and considered in my medical decision making (see chart for details).     Medications  potassium chloride SA (K-DUR,KLOR-CON) CR tablet 40 mEq (not administered)  traMADol (ULTRAM) tablet 50 mg (50 mg Oral Given 12/07/16 2141)    Patient Vitals for the past 24 hrs:  BP Temp Temp src Pulse Resp SpO2  12/07/16 2315 (!) 128/56 - - 77 - 100 %  12/07/16 2234 (!) 141/62 - - 81 - 100 %  12/07/16 2145 (!) 128/54 - - 79 - 100 %  12/07/16 2130 134/77 - - 81 - 99 %  12/07/16 2100 (!) 144/60 - - 79 - 100 %  12/07/16 2030 (!) 145/56 - - 81 - 100 %  12/07/16 2000 (!) 119/49 - - 82 - 100 %  12/07/16 1930 (!) 126/54 - - 80 - 99 %  12/07/16 1900 112/81 - - 81 - 97 %  12/07/16 1841 (!) 137/57 - - 82 (!) 22 99 %  12/07/16 1641 (!) 137/51 - - 82 (!)  22 99 %  12/07/16 1504 (!) 138/57 98.8 F (37.1 C) Oral 83 18 97 %    11:29 PM Reevaluation with update and discussion. After initial assessment and treatment, an updated evaluation reveals she remains alert cooperative and comfortable.  Findings discussed with the patient and her husband, all questions were answered. Kihanna Kamiya L    Final Clinical Impressions(s) / ED Diagnoses   Final diagnoses:  Fall, initial encounter  Injury of head, initial encounter  Abrasion of scalp, initial encounter  Hypokalemia   Apparent mechanical fall without serious injury.  Incidental hypokalemia associated with use of diuretic medication.  Patient is currently on high-dose potassium treatment at this time.  Will increase potassium dosing to 80 mEq a day for 4 days, and plan on outpatient potassium check at that time.  Nursing Notes Reviewed/ Care Coordinated Applicable Imaging Reviewed Interpretation of Laboratory Data incorporated into ED treatment  The patient appears reasonably screened and/or stabilized for discharge and I doubt any other medical condition or other [facility] requiring further screening, evaluation, or treatment in the ED at this time prior to discharge.  Plan: Home Medications-continue usual increase potassium, to 80 mEq daily for 4 days.; Home Treatments-wound care at home and rest.; return here if the recommended treatment, does not improve the symptoms; Recommended follow up-to be checkup 4 days.   New Prescriptions  New Prescriptions   No medications on file     Daleen Bo, MD 12/07/16 2337

## 2016-12-07 NOTE — ED Notes (Signed)
Make provider aware of ammonia level

## 2016-12-10 ENCOUNTER — Telehealth: Payer: Self-pay | Admitting: *Deleted

## 2016-12-10 DIAGNOSIS — N183 Chronic kidney disease, stage 3 (moderate): Secondary | ICD-10-CM | POA: Diagnosis not present

## 2016-12-10 DIAGNOSIS — Z7982 Long term (current) use of aspirin: Secondary | ICD-10-CM | POA: Diagnosis not present

## 2016-12-10 DIAGNOSIS — K7581 Nonalcoholic steatohepatitis (NASH): Secondary | ICD-10-CM | POA: Diagnosis not present

## 2016-12-10 DIAGNOSIS — M545 Low back pain: Secondary | ICD-10-CM | POA: Diagnosis not present

## 2016-12-10 DIAGNOSIS — M6281 Muscle weakness (generalized): Secondary | ICD-10-CM | POA: Diagnosis not present

## 2016-12-10 DIAGNOSIS — Z87891 Personal history of nicotine dependence: Secondary | ICD-10-CM | POA: Diagnosis not present

## 2016-12-10 DIAGNOSIS — E1122 Type 2 diabetes mellitus with diabetic chronic kidney disease: Secondary | ICD-10-CM | POA: Diagnosis not present

## 2016-12-10 DIAGNOSIS — J440 Chronic obstructive pulmonary disease with acute lower respiratory infection: Secondary | ICD-10-CM | POA: Diagnosis not present

## 2016-12-10 DIAGNOSIS — I129 Hypertensive chronic kidney disease with stage 1 through stage 4 chronic kidney disease, or unspecified chronic kidney disease: Secondary | ICD-10-CM | POA: Diagnosis not present

## 2016-12-10 DIAGNOSIS — J9611 Chronic respiratory failure with hypoxia: Secondary | ICD-10-CM | POA: Diagnosis not present

## 2016-12-10 NOTE — Telephone Encounter (Signed)
Melissa, from [facility], called regarding orders for skilled nursing 1 time a week x 1 week, 3 times a week x 1 week and 2 times a week x 3 weeks.  She also requested an evaluate and treat order for PT.  Per Dr Melford Aase, it is OK for all.

## 2016-12-11 ENCOUNTER — Encounter: Payer: Self-pay | Admitting: Physician Assistant

## 2016-12-11 ENCOUNTER — Ambulatory Visit (INDEPENDENT_AMBULATORY_CARE_PROVIDER_SITE_OTHER): Payer: PPO | Admitting: Physician Assistant

## 2016-12-11 ENCOUNTER — Other Ambulatory Visit (INDEPENDENT_AMBULATORY_CARE_PROVIDER_SITE_OTHER): Payer: PPO

## 2016-12-11 VITALS — BP 100/58 | HR 76 | Wt 199.0 lb

## 2016-12-11 DIAGNOSIS — K7581 Nonalcoholic steatohepatitis (NASH): Secondary | ICD-10-CM

## 2016-12-11 DIAGNOSIS — R1319 Other dysphagia: Secondary | ICD-10-CM

## 2016-12-11 DIAGNOSIS — R1310 Dysphagia, unspecified: Secondary | ICD-10-CM | POA: Diagnosis not present

## 2016-12-11 DIAGNOSIS — K7460 Unspecified cirrhosis of liver: Secondary | ICD-10-CM | POA: Diagnosis not present

## 2016-12-11 DIAGNOSIS — K7290 Hepatic failure, unspecified without coma: Secondary | ICD-10-CM | POA: Diagnosis not present

## 2016-12-11 DIAGNOSIS — R188 Other ascites: Secondary | ICD-10-CM

## 2016-12-11 DIAGNOSIS — R609 Edema, unspecified: Secondary | ICD-10-CM

## 2016-12-11 DIAGNOSIS — K7682 Hepatic encephalopathy: Secondary | ICD-10-CM

## 2016-12-11 LAB — AMMONIA: AMMONIA: 76 umol/L — AB (ref 11–35)

## 2016-12-11 LAB — BASIC METABOLIC PANEL
BUN: 14 mg/dL (ref 6–23)
CO2: 34 mEq/L — ABNORMAL HIGH (ref 19–32)
Calcium: 8.5 mg/dL (ref 8.4–10.5)
Chloride: 89 mEq/L — ABNORMAL LOW (ref 96–112)
Creatinine, Ser: 1.24 mg/dL — ABNORMAL HIGH (ref 0.40–1.20)
GFR: 45.44 mL/min — AB (ref >60.00)
GLUCOSE: 201 mg/dL — AB (ref 70–99)
POTASSIUM: 3.7 meq/L (ref 3.5–5.1)
SODIUM: 127 meq/L — AB (ref 135–145)

## 2016-12-11 NOTE — Progress Notes (Signed)
Subjective:    Patient ID: [name], female    DOB: 1946-06-02, 71 y.o.   MRN: [id number]  HPI [name] is a 71 year old white female, established with Dr. Havery Moros who was last seen in our office about a year ago. She comes back today for follow-up all of hepatic failure and hepatic encephalopathy. Her husband is her primary caregiver and gives most of the history. Patient has [name] cirrhosis, decompensated with history of esophageal varices though no prior bleeding, ascites, thrombocytopenia and hepatic encephalopathy. She has had 4 admissions this year, generally cared for by the hospitalist. She had 2 episodes of pneumonia. She had an ER visit last week after she fell at home. Imaging was negative. Her husband says she's been a bit more unsteady over the past couple of weeks. She is being maintained on lactulose 60 mL by mouth daily. Her husband says she does nap a lot during the day and again has been a bit more unsteady over the past couple of weeks. She has been mentating well and he denies any confusion. She generally has 2 bowel movements per day. He has noted that her weight is up about 15 pounds from her baseline despite being on Lasix 80 mg every morning and 40 mg every afternoon. They have a prescription for Zaroxolyn but he does not use this on a regular basis. She has been maintained on a beta blocker. She has had a couple of recent limited ultrasound's with no significant ascites. Last imaging CT of the abdomen and pelvis December 2017 showed her to be status post cholecystectomy, cirrhotic liver with splenomegaly she has umbilical and abdominal wall varices as well as Peri-splenic varices. Last EGD March 2015 per Dr. Deatra Ina with grade 2-3 varices, she has not had treatment for varices as has not had any bleeding. He did dilate her esophagus for a distal stricture at that time #18 balloon. Most recent labs done on 12/07/2016 with ER visit showed an ammonia level at 80 ,Gluc 302,  sodium 128, potassium 2.8 creatinine 1.1 ,hemoglobin 9.4 ,platelets of 98 PT14 / INR of 1.0  Review of Systems Pertinent positive and negative review of systems were noted in the above HPI section.  All other review of systems was otherwise negative.  Outpatient Encounter Prescriptions as of 12/11/2016  Medication Sig  . albuterol (PROVENTIL HFA;VENTOLIN HFA) 108 (90 Base) MCG/ACT inhaler Inhale 2 puffs into the lungs every 6 (six) hours as needed for wheezing or shortness of breath.  Marland Kitchen albuterol (PROVENTIL) (2.5 MG/3ML) 0.083% nebulizer solution Take 2.5 mg by nebulization every 4 (four) hours as needed for wheezing or shortness of breath.   . allopurinol (ZYLOPRIM) 300 MG tablet take 1 tablet by mouth daily for gout  . ALPRAZolam (XANAX) 0.5 MG tablet Take 0.5 mg by mouth daily as needed for anxiety or sleep.  Marland Kitchen amoxicillin (AMOXIL) 500 MG capsule Take 500 mg by mouth 3 (three) times daily.   Marland Kitchen aspirin EC 81 MG tablet Take 81 mg by mouth at bedtime.  . bisoprolol (ZEBETA) 10 MG tablet Take 1 tablet (10 mg total) by mouth daily.  . Cholecalciferol (VITAMIN D3) 5000 units CAPS Take 5,000 Units by mouth daily with breakfast.  . citalopram (CELEXA) 40 MG tablet Take 1 tablet (40 mg total) by mouth daily.  . cyclobenzaprine (FLEXERIL) 10 MG tablet Take 1 tablet (10 mg total) by mouth 3 (three) times daily as needed for muscle spasms.  Marland Kitchen dextromethorphan-guaiFENesin (MUCINEX DM) 30-600 MG 12hr tablet  Take 1 tablet by mouth 2 (two) times daily.  . ferrous sulfate 325 (65 FE) MG tablet Take 325 mg by mouth daily.   . furosemide (LASIX) 80 MG tablet Take 0.5-1 tablets (40-80 mg total) by mouth See admin instructions. Take 1 tablet (80 mg) by mouth every morning and 1/2 tablet (40 mg) after supper (Patient taking differently: Take 40-80 mg by mouth See admin instructions. 80 mg in the morning and 40 mg in the (late) afternoon)  . gabapentin (NEURONTIN) 300 MG capsule Take 1 capsule (300 mg total) by mouth 3  (three) times daily.  Marland Kitchen ipratropium (ATROVENT) 0.02 % nebulizer solution Take 2.5 mLs (0.5 mg total) by nebulization every 4 (four) hours as needed for wheezing or shortness of breath.  . lactulose (CHRONULAC) 10 GM/15ML solution Take 30-45 g by mouth See admin instructions. One to two times a day to regulate ammonia levels  . Magnesium 250 MG TABS Take 250 mg by mouth at bedtime.   . meclizine (ANTIVERT) 25 MG tablet Take 1 tablet (25 mg total) by mouth 2 (two) times daily.  . metolazone (ZAROXOLYN) 5 MG tablet Take 2.5 mg by mouth daily as needed (for a weight gain of 2-3 pounds in 24 hours).   . ondansetron (ZOFRAN) 4 MG tablet Take 1 tablet (4 mg total) by mouth every 6 (six) hours as needed for nausea or vomiting.  . OXYGEN Inhale 2 L into the lungs continuous. 3 L with exertion  . pantoprazole (PROTONIX) 40 MG tablet Take 1 tablet (40 mg total) by mouth 2 (two) times daily as needed for heartburn (Patient taking differently: Take 40 mg by mouth daily before breakfast. )  . potassium chloride (K-DUR) 10 MEQ tablet Take 6 tablets (60 mEq total) by mouth daily. (Patient taking differently: Take 9 tablets daily x 4 days (as of 12/11/16 last day of  9tabs) then take 7 tablets daily thereafter)  . promethazine-dextromethorphan (PROMETHAZINE-DM) 6.25-15 MG/5ML syrup Take 2.5-5 mLs by mouth 2 (two) times daily as needed for cough.  . traMADol (ULTRAM) 50 MG tablet take 1 TABLET BY MOUTH EVERY 8 HOURS (Patient taking differently: Take 50 mg by mouth every 8 hours as needed for pain)  . triamcinolone cream (KENALOG) 0.1 % Apply 1 application topically 3 (three) times daily as needed for itching.  . [DISCONTINUED] amoxicillin (AMOXIL) 500 MG capsule Take 1 capsule 3 x day for UTI   No facility-administered encounter medications on file as of 12/11/2016.    Allergies  Allergen Reactions  . Atorvastatin Other (See Comments)    Doesn't remember reaction  . Diphenhydramine Hcl (Sleep) Hives  .  Hydrocodone-Acetaminophen Other (See Comments)    Reaction not remembered  . Lopid [Gemfibrozil] Other (See Comments)    Doesn't remember reaction  . Loratadine Hives  . Lorazepam Hives  . Simvastatin Other (See Comments)    Reaction not remembered  . Sulfamethoxazole Hives  . Sulfonamide Derivatives Hives   Patient Active Problem List   Diagnosis Date Noted  . COPD mixed type ([place]) 11/30/2016  . Obesity (BMI 30-39.9) 10/12/2016  . Fever 10/12/2016  . Hyperammonemia ([place]) 10/12/2016  . Atrial tachycardia, multifocal ([place])   . QT prolongation 03/05/2016  . Acute renal failure superimposed on stage 3 chronic kidney disease ([place])   . Hyponatremia   . Acute on chronic respiratory failure ([place]) 01/19/2016  . DM type 2 causing CKD stage 3 ([place]) 10/30/2015  . Hepatic encephalopathy ([place]) 09/21/2015  . Hypokalemia 09/21/2015  .  COPD with acute exacerbation ([place]) 08/30/2015  . CKD stage 3 due to type 2 diabetes mellitus ([place]) 08/16/2015  . Acute on chronic respiratory failure with hypoxia ([place]) 08/07/2015  . Thrombocytopenia ([place]) 08/07/2015  . Venous insufficiency 07/26/2015  . Encounter for Medicare annual wellness exam 02/16/2015  . Medication management 12/14/2013  . Essential hypertension   . Hyperlipidemia   . GERD   . Vitamin D deficiency   . IBS   . Fibromyalgia   . Esophageal varices ([place]) 07/08/2012  . COLONIC POLYPS 11/11/2008  . Anemia 05/28/2008  . Hepatic cirrhosis ([place]) 05/28/2008  . Esophagitis 05/27/2008  . Diverticulosis of large intestine 05/27/2008   Social History   Social History  . Marital status: Married    Spouse name: [name]  . Number of children: 5  . Years of education: N/A   Occupational History  . Housewife Unemployed   Social History Main Topics  . Smoking status: Former Smoker    Packs/day: 1.00    Years: 50.00    Types: Cigarettes    Quit date: 11/05/2014  . Smokeless tobacco: Never Used     Comment: Quit April 2016  . Alcohol use  No  . Drug use: No  . Sexual activity: Not on file   Other Topics Concern  . Not on file   Social History Narrative  . No narrative on file    Ms. Heavin's family history includes Atrial fibrillation in her sister; Diabetes in her brother; Heart disease in her mother and sister.      Objective:    Vitals:   12/11/16 1507  BP: (!) 100/58  Pulse: 76    Physical Exam  well-developed older white female, in a wheelchair. She keeps her eyes closed for most of the visit, occasionally commenting. Her husband gives most of history. Blood pressure 100/58 pulse 76, not weighed today her husband says she weighed 199 this morning. Baseline weight 186. HEENT nontraumatic normocephalic EOMI PERRLA sclera anicteric, Cardiovascular; regular rate and rhythm with S1-S2 no murmur rub or gallop, Pulmonary ;decreased breath sounds bilaterally patient on chronic oxygen, Abdomen; protuberant no focal tenderness she does have some pitting edema in the lower half of her abdomen and visible dilated veins, bowel sounds are present no palpable mass or hepatosplenomegaly no fluid wave, Rectal ;exam not done, Extremities ;she has chronic stasis changes and 2+ edema bilateral lower extremities to the knees, Neuropsych; appears to be a bit sleepy but does answer questions and opens her eyes, oriented- positive asterixis       Assessment & Plan:   #77 71 year old white female with decompensated [name] cirrhosis with element of chronic hepatic encephalopathy, peripheral edema with recent weight gain of 15 pounds. #2 history of esophageal varices-last EGD March 2015 with grade 2-3 varices not treated no prior bleeding. Decision had been made by Dr. Havery Moros not to pursue serial EGDs for screening given her multiple comorbidities, and oxygen-dependent COPD #3. Umbilical/of abdominal wall /Peri- splenic varices #4 previous history of colon polyps-follow-up not planned due to high risk for sedation and multiple other  serious comorbidities #5 debilitation #6 oxygen-dependent COPD #7 chronic kidney disease stage III #8 adult-onset diabetes mellitus #9 bipolar disorder #10 vague complaints of dysphagia-previous esophageal stricture dilated 2015  Plan Patient will continue Lasix 80 mg by mouth every morning and 40 mg every afternoon Husband advised to start Zaroxolyn 2.5 mg every other day until she achieves a weight loss of 10 pounds Low-sodium diet/2 g Increase lactulose to  60 mL by mouth every morning and 30 mL by mouth at dinner with goal of at least 3 bowel movements per day We will repeat be met today, she's been on potassium supplementation, repeat ammonia level, check AFP Schedule for barium swallow with tablet Patient will follow-up with Dr. Havery Moros in one month Happy to see her in the interim as needed. Nymir Ringler S Jalin Alicea PA-C 12/11/2016   Cc: Unk Pinto, MD

## 2016-12-11 NOTE — Patient Instructions (Signed)
If you are age 70 or older, your body mass index should be between 23-30. Your Body mass index is 36.4 kg/m. If this is out of the aforementioned range listed, please consider follow up with your Primary Care Provider.  If you are age 1 or younger, your body mass index should be between 19-25. Your Body mass index is 36.4 kg/m. If this is out of the aformentioned range listed, please consider follow up with your Primary Care Provider.   Your physician has requested that you go to the basement for lab work before leaving today.  INCREASE Lactulose to 60 ml in the morning, and 30 ml at dinner. Continue Lasix  80 mg in the morning and 40 mg in the evening. Add Metolazone 5 mg 1/2 tablet every other day until weight is down to 185.  Continue low sodium diet.  Follow up appointment with Dr. Havery Moros for 01/15/2017 at 2:45 pm.  Thank you for choosing me and [facility].   Amy Esterwood, PA-C

## 2016-12-12 ENCOUNTER — Other Ambulatory Visit: Payer: Self-pay | Admitting: *Deleted

## 2016-12-12 DIAGNOSIS — M1712 Unilateral primary osteoarthritis, left knee: Secondary | ICD-10-CM | POA: Diagnosis not present

## 2016-12-12 LAB — AFP TUMOR MARKER: AFP TUMOR MARKER: 5.1 ng/mL (ref <6.1)

## 2016-12-12 MED ORDER — LACTULOSE 10 GM/15ML PO SOLN: 30.0000 g | ORAL | 1 refills | Status: DC

## 2016-12-12 NOTE — Progress Notes (Signed)
Agree with assessment and plan as outlined.  Regarding management of her varices, regimen changed previously per Dr. Melvyn Novas given her worsening COPD, nadolol was changed to bisoprolol.  Regarding hepatic encephalopathy, possible this could be related to her falls. If she can afford / obtain it, adding rifaximin would be helpful. I'll see her in a month for follow up

## 2016-12-12 NOTE — Progress Notes (Signed)
Cardiology Office Note   Date:  12/14/2016   ID:  [name], DOB 07-17-1946, MRN [id number]  PCP:  Unk Pinto, MD  Cardiologist:   Minus Breeding, MD   Chief Complaint  Patient presents with  . Edema      History of Present Illness: [name] is a 71 y.o. female who presents for evaluation of edema and LVH on echo.  I saw her first in Jan of 2017.  She was in the hospital in June of last year with respiratory failure related to COPD and pneumonia.  She did have a very mild troponin elevation that was thought to be demand ischemia.  She did have transient MAT vs atrial fib. She has been in the hospital twice in April and I reviewed these records as well. She's had exacerbation of cirrhosis with NASH.  She's had exacerbation of COPD. At the last visit she had fever without clear origin but they later found that she had a UTI.  She was in the ED earlier this month with a fall and no syncope.  I reviewed these records for this visit.    Of was very hypokalemic and was given increased PO potassium to take at home.  She was seen by gastroenterology a couple of days ago and she has about 14 pounds higher weight than what might be her baseline. She has anasarca. Her potassium was back to baseline although she is hyponatremic which she usually is. She was started on Zaroxolyn every other day. Her weights are slowly coming down. She sore in her abdomen where she has significant distention. She's not describing any new shortness of breath, PND or orthopnea. She's not having palpitations, presyncope or syncope. On further questioning her she drinks "a lot of water".  Past Medical History:  Diagnosis Date  . Asthma   . COPD (chronic obstructive pulmonary disease) ([place])   . Esophageal varices ([name])   . Fibromyalgia   . Gastritis   . Hepatic cirrhosis ([name])   . Hepatic encephalopathy ([name])   . Hyperlipidemia   . Hypertension   . IBS (irritable bowel syndrome)   . Splenomegaly     . Vitamin D deficiency     Past Surgical History:  Procedure Laterality Date  . ABDOMINAL HYSTERECTOMY    . BACK SURGERY    . CATARACT EXTRACTION, BILATERAL    . CHOLECYSTECTOMY    . ESOPHAGOGASTRODUODENOSCOPY  07/16/2012   Procedure: ESOPHAGOGASTRODUODENOSCOPY (EGD);  Surgeon: Inda Castle, MD;  Location: [facility];  Service: Endoscopy;  Laterality: N/A;  . GASTRIC VARICES BANDING  07/16/2012   Procedure: GASTRIC VARICES BANDING;  Surgeon: Inda Castle, MD;  Location: [facility];  Service: Endoscopy;  Laterality: N/A;  . HIP ARTHROPLASTY Bilateral   . IR GENERIC HISTORICAL  06/25/2016   IR RADIOLOGIST EVAL & MGMT 06/25/2016 [facility]  . IR GENERIC HISTORICAL  06/27/2016   IR KYPHO LUMBAR INC FX REDUCE BONE BX UNI/BIL CANNULATION INC/IMAGING 06/27/2016 Luanne Bras, MD [facility]  . IR GENERIC HISTORICAL  07/17/2016   IR RADIOLOGIST EVAL & MGMT 07/17/2016 [facility]  . NECK SURGERY    . SHOULDER SURGERY Right      Current Outpatient Prescriptions  Medication Sig Dispense Refill  . albuterol (PROVENTIL HFA;VENTOLIN HFA) 108 (90 Base) MCG/ACT inhaler Inhale 2 puffs into the lungs every 6 (six) hours as needed for wheezing or shortness of breath. 1 Inhaler 2  . albuterol (PROVENTIL) (2.5 MG/3ML) 0.083% nebulizer solution  Take 2.5 mg by nebulization every 4 (four) hours as needed for wheezing or shortness of breath.     . allopurinol (ZYLOPRIM) 300 MG tablet take 1 tablet by mouth daily for gout 90 tablet 1  . ALPRAZolam (XANAX) 0.5 MG tablet Take 0.5 mg by mouth daily as needed for anxiety or sleep.    Marland Kitchen aspirin EC 81 MG tablet Take 81 mg by mouth at bedtime.    . bisoprolol (ZEBETA) 10 MG tablet Take 1 tablet (10 mg total) by mouth daily. 90 tablet 1  . Cholecalciferol (VITAMIN D3) 5000 units CAPS Take 5,000 Units by mouth daily with breakfast.    . citalopram (CELEXA) 40 MG tablet Take 1 tablet (40 mg total) by mouth daily.    . cyclobenzaprine (FLEXERIL)  10 MG tablet Take 1 tablet (10 mg total) by mouth 3 (three) times daily as needed for muscle spasms. 90 tablet 2  . ferrous sulfate 325 (65 FE) MG tablet Take 325 mg by mouth daily.     . furosemide (LASIX) 80 MG tablet Take 80 mg in the morning and 40 mg in the evening    . gabapentin (NEURONTIN) 300 MG capsule Take 1 capsule (300 mg total) by mouth 3 (three) times daily.    Marland Kitchen ipratropium (ATROVENT) 0.02 % nebulizer solution Take 2.5 mLs (0.5 mg total) by nebulization every 4 (four) hours as needed for wheezing or shortness of breath.    . lactulose (CHRONULAC) 10 GM/15ML solution Take 45-67.5 mLs (30-45 g total) by mouth See admin instructions. One to two times a day to regulate ammonia levels 240 mL 1  . Magnesium 250 MG TABS Take 250 mg by mouth at bedtime.     . meclizine (ANTIVERT) 25 MG tablet Take 1 tablet (25 mg total) by mouth 2 (two) times daily.    . metolazone (ZAROXOLYN) 5 MG tablet Take 2.5 mg by mouth. Every other day    . ondansetron (ZOFRAN) 4 MG tablet Take 1 tablet (4 mg total) by mouth every 6 (six) hours as needed for nausea or vomiting. 30 tablet 1  . OXYGEN Inhale 2 L into the lungs continuous. 3 L with exertion    . pantoprazole (PROTONIX) 40 MG tablet Take 40 mg by mouth daily.    . potassium chloride (K-DUR,KLOR-CON) 10 MEQ tablet Take 3 tablets in Morning, 2 tablets in afternoon and 2 tablets at night    . traMADol (ULTRAM) 50 MG tablet take 1 TABLET BY MOUTH EVERY 8 HOURS (Patient taking differently: Take 50 mg by mouth every 8 hours as needed for pain) 90 tablet 1  . triamcinolone cream (KENALOG) 0.1 % Apply 1 application topically 3 (three) times daily as needed for itching.  0   No current facility-administered medications for this visit.     Allergies:   Atorvastatin; Diphenhydramine hcl (sleep); Hydrocodone-acetaminophen; Lopid [gemfibrozil]; Loratadine; Lorazepam; Simvastatin; Sulfamethoxazole; and Sulfonamide derivatives    ROS:  Please see the history of  present illness.   Otherwise, review of systems are positive for bruising, weakness, increased thirst.   All other systems are reviewed and negative.    PHYSICAL EXAM: VS:  BP 120/60   Pulse 78   Ht 5' 2"  (1.575 m)   Wt 196 lb (88.9 kg)   BMI 35.85 kg/m  , BMI Body mass index is 35.85 kg/m. GEN:  No distress NECK:  Positive jugular venous distention at 90 degrees, waveform within normal limits, carotid upstroke brisk and symmetric, no bruits,  no thyromegaly LUNGS:  Clear to auscultation bilaterally BACK:  No CVA tenderness CHEST:  Unremarkable HEART:  S1 and S2 within normal limits, no S3, no S4, no clicks, no rubs, no murmurs ABD:  Positive bowel sounds normal in frequency in pitch, no bruits, no rebound, no guarding, unable to assess midline mass or bruit with the patient seated.  She has abdominal distension and tense edema up to her chest EXT:  2 plus pulses throughout, moderate edema, no cyanosis no clubbing SKIN:  No rashes no nodules, bruising NEURO:  Cranial nerves II through XII grossly intact, motor grossly intact throughout PSYCH:  Cognitively intact, oriented to person place and time   EKG:  EKG is not  ordered today.    Recent Labs: 08/29/2016: TSH 1.04 11/30/2016: B Natriuretic Peptide 144.7 12/04/2016: ALT 29; Magnesium 1.9 12/07/2016: Hemoglobin 9.4; Platelets 98 12/11/2016: BUN 14; Creatinine, Ser 1.24; Potassium 3.7; Sodium 127    Lipid Panel    Component Value Date/Time   CHOL 183 08/29/2016 1219   TRIG 226 (H) 08/29/2016 1219   HDL 36 (L) 08/29/2016 1219   CHOLHDL 5.1 (H) 08/29/2016 1219   VLDL 45 (H) 08/29/2016 1219   LDLCALC 102 (H) 08/29/2016 1219      Wt Readings from Last 3 Encounters:  12/14/16 196 lb (88.9 kg)  12/11/16 199 lb (90.3 kg)  12/04/16 187 lb (84.8 kg)      Other studies Reviewed: Additional studies/ records that were reviewed today include: Extensive hospital records from June. Review of the above records demonstrates:  Please  see elsewhere in the note.     ASSESSMENT AND PLAN:  ATRIAL ARRHYTHMIA:   This been no symptomatic evidence of this. Regardless she would not be an anticoagulation candidate so further monitoring is not indicated. No change in therapy is planned.    LVH:  The patient does have some diastolic dysfunction which may contribute to some of her volume issues. However, this needs to be treated with conservative measures.  I previously stented great deal of time talking to her husband about salt and fluid restriction and trying to keep her elevated but they don't comply with this. To be on a 1200 cc fluid restriction. Her weight is starting to come down she seems to be slowly diuresing. She understands that if she cannot achieve fluid restriction and if she doesn't start to keep her feet elevated she'll end up in the hospital soon.  They would like to avoid this.    EDEMA/ANASARCA:    As above.   HYPOKALEMIA:   This was better on follow up at the GI office.  I will order BMET for Monday  COPD:  She saw Dr. Melvyn Novas and she was thought to have severe COPD.    QT PROLONGATION:  This has been noted on the EKG and was present when she was in the hospital.   I reviewed the most recent from April and this was still present.     The patient has no symptoms related to this.  This could be related to medications.    No further QT prolonging medications should be used.      Current medicines are reviewed at length with the patient today.  The patient does not have concerns regarding medicines.  The following changes have been made:    Labs/ tests ordered today include:     Orders Placed This Encounter  Procedures  . Basic Metabolic Panel (BMET)     Disposition:  FU with me in 6 months.  She needs to follow up next week her PCP or GI and we will try to facilitate this.    Signed, Minus Breeding, MD  12/14/2016 3:28 PM    [facility]

## 2016-12-14 ENCOUNTER — Ambulatory Visit (INDEPENDENT_AMBULATORY_CARE_PROVIDER_SITE_OTHER): Payer: PPO | Admitting: Cardiology

## 2016-12-14 ENCOUNTER — Encounter: Payer: Self-pay | Admitting: Cardiology

## 2016-12-14 VITALS — BP 120/60 | HR 78 | Ht 62.0 in | Wt 196.0 lb

## 2016-12-14 DIAGNOSIS — E876 Hypokalemia: Secondary | ICD-10-CM

## 2016-12-14 DIAGNOSIS — E1122 Type 2 diabetes mellitus with diabetic chronic kidney disease: Secondary | ICD-10-CM | POA: Diagnosis not present

## 2016-12-14 DIAGNOSIS — R9431 Abnormal electrocardiogram [ECG] [EKG]: Secondary | ICD-10-CM

## 2016-12-14 DIAGNOSIS — I129 Hypertensive chronic kidney disease with stage 1 through stage 4 chronic kidney disease, or unspecified chronic kidney disease: Secondary | ICD-10-CM | POA: Diagnosis not present

## 2016-12-14 DIAGNOSIS — J9611 Chronic respiratory failure with hypoxia: Secondary | ICD-10-CM | POA: Diagnosis not present

## 2016-12-14 DIAGNOSIS — Z7982 Long term (current) use of aspirin: Secondary | ICD-10-CM | POA: Diagnosis not present

## 2016-12-14 DIAGNOSIS — Z79899 Other long term (current) drug therapy: Secondary | ICD-10-CM

## 2016-12-14 DIAGNOSIS — N183 Chronic kidney disease, stage 3 (moderate): Secondary | ICD-10-CM | POA: Diagnosis not present

## 2016-12-14 DIAGNOSIS — K7581 Nonalcoholic steatohepatitis (NASH): Secondary | ICD-10-CM | POA: Diagnosis not present

## 2016-12-14 DIAGNOSIS — I517 Cardiomegaly: Secondary | ICD-10-CM | POA: Diagnosis not present

## 2016-12-14 DIAGNOSIS — R601 Generalized edema: Secondary | ICD-10-CM | POA: Diagnosis not present

## 2016-12-14 DIAGNOSIS — J440 Chronic obstructive pulmonary disease with acute lower respiratory infection: Secondary | ICD-10-CM | POA: Diagnosis not present

## 2016-12-14 DIAGNOSIS — M545 Low back pain: Secondary | ICD-10-CM | POA: Diagnosis not present

## 2016-12-14 DIAGNOSIS — Z87891 Personal history of nicotine dependence: Secondary | ICD-10-CM | POA: Diagnosis not present

## 2016-12-14 DIAGNOSIS — M6281 Muscle weakness (generalized): Secondary | ICD-10-CM | POA: Diagnosis not present

## 2016-12-14 NOTE — Patient Instructions (Addendum)
Medication Instructions:  Continue current medications  Labwork: BMP on Monday  Testing/Procedures: None Ordered  Follow-Up: Your physician recommends that you schedule a follow-up appointment in: PCP next week Your physician wants you to follow-up in: 6 Months. You will receive a reminder letter in the mail two months in advance. If you don't receive a letter, please call our office to schedule the follow-up appointment.   Any Other Special Instructions Will Be Listed Below (If Applicable).  Pt need 1200cc fluid restrictions   If you need a refill on your cardiac medications before your next appointment, please call your pharmacy.

## 2016-12-17 ENCOUNTER — Other Ambulatory Visit: Payer: Self-pay

## 2016-12-17 DIAGNOSIS — J440 Chronic obstructive pulmonary disease with acute lower respiratory infection: Secondary | ICD-10-CM | POA: Diagnosis not present

## 2016-12-17 DIAGNOSIS — J9611 Chronic respiratory failure with hypoxia: Secondary | ICD-10-CM | POA: Diagnosis not present

## 2016-12-17 DIAGNOSIS — Z79899 Other long term (current) drug therapy: Secondary | ICD-10-CM | POA: Diagnosis not present

## 2016-12-17 DIAGNOSIS — E1122 Type 2 diabetes mellitus with diabetic chronic kidney disease: Secondary | ICD-10-CM | POA: Diagnosis not present

## 2016-12-17 DIAGNOSIS — N183 Chronic kidney disease, stage 3 (moderate): Secondary | ICD-10-CM | POA: Diagnosis not present

## 2016-12-17 DIAGNOSIS — Z87891 Personal history of nicotine dependence: Secondary | ICD-10-CM | POA: Diagnosis not present

## 2016-12-17 DIAGNOSIS — M545 Low back pain: Secondary | ICD-10-CM | POA: Diagnosis not present

## 2016-12-17 DIAGNOSIS — M6281 Muscle weakness (generalized): Secondary | ICD-10-CM | POA: Diagnosis not present

## 2016-12-17 DIAGNOSIS — J449 Chronic obstructive pulmonary disease, unspecified: Secondary | ICD-10-CM | POA: Diagnosis not present

## 2016-12-17 DIAGNOSIS — Z7982 Long term (current) use of aspirin: Secondary | ICD-10-CM | POA: Diagnosis not present

## 2016-12-17 DIAGNOSIS — R1314 Dysphagia, pharyngoesophageal phase: Secondary | ICD-10-CM

## 2016-12-17 DIAGNOSIS — I129 Hypertensive chronic kidney disease with stage 1 through stage 4 chronic kidney disease, or unspecified chronic kidney disease: Secondary | ICD-10-CM | POA: Diagnosis not present

## 2016-12-17 DIAGNOSIS — K7581 Nonalcoholic steatohepatitis (NASH): Secondary | ICD-10-CM | POA: Diagnosis not present

## 2016-12-17 NOTE — Progress Notes (Signed)
Mr Riles  Is notified. Appointment

## 2016-12-18 LAB — BASIC METABOLIC PANEL
BUN: 14 mg/dL (ref 7–25)
CHLORIDE: 91 mmol/L — AB (ref 98–110)
CO2: 38 mmol/L — ABNORMAL HIGH (ref 20–31)
CREATININE: 1.34 mg/dL — AB (ref 0.60–0.93)
Calcium: 8.4 mg/dL — ABNORMAL LOW (ref 8.6–10.4)
Glucose, Bld: 155 mg/dL — ABNORMAL HIGH (ref 65–99)
Potassium: 3 mmol/L — ABNORMAL LOW (ref 3.5–5.3)
Sodium: 139 mmol/L (ref 135–146)

## 2016-12-20 ENCOUNTER — Ambulatory Visit (INDEPENDENT_AMBULATORY_CARE_PROVIDER_SITE_OTHER): Payer: PPO | Admitting: Physician Assistant

## 2016-12-20 ENCOUNTER — Telehealth: Payer: Self-pay | Admitting: *Deleted

## 2016-12-20 ENCOUNTER — Encounter: Payer: Self-pay | Admitting: Physician Assistant

## 2016-12-20 ENCOUNTER — Ambulatory Visit: Payer: Self-pay

## 2016-12-20 VITALS — BP 120/82 | HR 72 | Temp 97.0°F | Resp 14 | Ht 62.0 in | Wt 192.0 lb

## 2016-12-20 DIAGNOSIS — Z79899 Other long term (current) drug therapy: Secondary | ICD-10-CM | POA: Diagnosis not present

## 2016-12-20 DIAGNOSIS — B373 Candidiasis of vulva and vagina: Secondary | ICD-10-CM | POA: Diagnosis not present

## 2016-12-20 DIAGNOSIS — B3731 Acute candidiasis of vulva and vagina: Secondary | ICD-10-CM

## 2016-12-20 DIAGNOSIS — J9611 Chronic respiratory failure with hypoxia: Secondary | ICD-10-CM

## 2016-12-20 DIAGNOSIS — K7290 Hepatic failure, unspecified without coma: Secondary | ICD-10-CM | POA: Diagnosis not present

## 2016-12-20 DIAGNOSIS — N183 Chronic kidney disease, stage 3 unspecified: Secondary | ICD-10-CM

## 2016-12-20 DIAGNOSIS — E1122 Type 2 diabetes mellitus with diabetic chronic kidney disease: Secondary | ICD-10-CM | POA: Diagnosis not present

## 2016-12-20 DIAGNOSIS — K7682 Hepatic encephalopathy: Secondary | ICD-10-CM

## 2016-12-20 DIAGNOSIS — J9612 Chronic respiratory failure with hypercapnia: Secondary | ICD-10-CM

## 2016-12-20 DIAGNOSIS — D696 Thrombocytopenia, unspecified: Secondary | ICD-10-CM | POA: Diagnosis not present

## 2016-12-20 DIAGNOSIS — R350 Frequency of micturition: Secondary | ICD-10-CM | POA: Diagnosis not present

## 2016-12-20 DIAGNOSIS — J449 Chronic obstructive pulmonary disease, unspecified: Secondary | ICD-10-CM

## 2016-12-20 DIAGNOSIS — K7460 Unspecified cirrhosis of liver: Secondary | ICD-10-CM

## 2016-12-20 MED ORDER — NYSTATIN 100000 UNIT/GM EX CREA: 1.0000 "application " | TOPICAL_CREAM | Freq: Two times a day (BID) | CUTANEOUS | 1 refills | Status: DC

## 2016-12-20 MED ORDER — BIOTENE DRY MOUTH MT LIQD: 15.0000 mL | OROMUCOSAL | 2 refills | Status: DC | PRN

## 2016-12-20 MED ORDER — POTASSIUM CHLORIDE CRYS ER 10 MEQ PO TBCR: 30.0000 meq | EXTENDED_RELEASE_TABLET | Freq: Three times a day (TID) | ORAL | 11 refills | Status: DC

## 2016-12-20 MED ORDER — TRIAMCINOLONE ACETONIDE 0.1 % EX CREA: TOPICAL_CREAM | CUTANEOUS | 0 refills | Status: DC

## 2016-12-20 MED ORDER — TERCONAZOLE 0.4 % VA CREA: 1.0000 | TOPICAL_CREAM | Freq: Every day | VAGINAL | 2 refills | Status: DC

## 2016-12-20 NOTE — Telephone Encounter (Signed)
Olivia Mackie, from [facility], called and states the patient would like to reduce the nurse visits from 2 times a week to 1 time a week.  Per Dr Melford Aase, reduce the nurse visits to 1 time a month.  Olivia Mackie is aware.

## 2016-12-20 NOTE — Progress Notes (Signed)
FOLLOW UP  Assessment:    Type 2 diabetes mellitus with stage 3 chronic kidney disease, without long-term current use of insulin (HCC) -cont diet and exercise - Hemoglobin A1c   Other cirrhosis of liver (HCC) -monitored by GI -cont lactulose at higher dose   Hyperlipidemia -cont meds -diet and exercise - Lipid panel   Medication management - CBC with Differential/Platelet - BASIC METABOLIC PANEL WITH GFR - Hepatic function panel - Magnesium  COPD GOLD II with min reversiblity  -followed by pulmonology, continue O2  Hepatic encephalopathy (HCC) -cont lactulose and xifaximin - check lab   mrbid Obesity -cont diet and exercise   Thrombocytopenia (HCC) -monitor with CBC  Yeast vaginitis Creams given, try to keep area clean and dry  Urinary frequency Rule out UTI  Over 30 minutes of exam, counseling, chart review, and critical decision making was performed   Subjective:   [name] is a 71 y.o. female who presents for follow up for liver failure with several complications including CHF, severe COPD, anasarca, DM2.   Has followed up with GI and cardiology recently, has had multiple medical admissions, still at home with husband. We have approached patient and husband many times regarding hospice or palliative care and they have declined. Discussed being DNR and patient is possibly willing but husband is against.  Weight has been slowly decreasing with recent increase of fluid pills and potassium, she is on lasix 80 in the morning and 40 in the evening, getting 2.5 of the metolazone every other day, potassium is 7 pills a day and 2 extra on the days she gets metolazone.  Wt Readings from Last 3 Encounters:  12/20/16 192 lb (87.1 kg)  12/14/16 196 lb (88.9 kg)  12/11/16 199 lb (90.3 kg)   Lab Results  Component Value Date   CREATININE 1.34 (H) 12/17/2016   BUN 14 12/17/2016   NA 139 12/17/2016   K 3.0 (L) 12/17/2016   CL 91 (L) 12/17/2016   CO2 38 (H)  12/17/2016   Has had FUO, no results at hospital last visit, has had another temp at home of 101, states has raw area on right inguinal area wants looked at, raw from depends.  Her blood pressure has been controlled at home, today their BP is BP: 120/82 She does not workout. She denies chest pain, shortness of breath, dizziness.  She is not on cholesterol medication and denies myalgias. Her cholesterol is not at goal. The cholesterol last visit was:   Lab Results  Component Value Date   CHOL 183 08/29/2016   HDL 36 (L) 08/29/2016   LDLCALC 102 (H) 08/29/2016   TRIG 226 (H) 08/29/2016   CHOLHDL 5.1 (H) 08/29/2016   She has not been working on diet and exercise for diabetes, and denies foot ulcerations, hyperglycemia, hypoglycemia , increased appetite, nausea, paresthesia of the feet, polydipsia, polyuria, visual disturbances, vomiting and weight loss. Last A1C in the office was:  Lab Results  Component Value Date   HGBA1C 6.9 (H) 10/12/2016   Last GFR  Lab Results  Component Value Date   GFRNONAA 46 (L) 12/07/2016   Patient is on Vitamin D supplement. Lab Results  Component Value Date   VD25OH 42 04/19/2016      She follows with Dr. Havery Moros, NASH, she has history of esophageal varices and but is on bisoprolol rather than naldalol due to lung function. She has had several admissions for encephalopathy, she has been taking her lactulose regularly per her husband,  and was recently increased at the GI doctor.    Medication Review Current Outpatient Prescriptions on File Prior to Visit  Medication Sig Dispense Refill  . albuterol (PROVENTIL HFA;VENTOLIN HFA) 108 (90 Base) MCG/ACT inhaler Inhale 2 puffs into the lungs every 6 (six) hours as needed for wheezing or shortness of breath. 1 Inhaler 2  . albuterol (PROVENTIL) (2.5 MG/3ML) 0.083% nebulizer solution Take 2.5 mg by nebulization every 4 (four) hours as needed for wheezing or shortness of breath.     . allopurinol (ZYLOPRIM)  300 MG tablet take 1 tablet by mouth daily for gout 90 tablet 1  . ALPRAZolam (XANAX) 0.5 MG tablet Take 0.5 mg by mouth daily as needed for anxiety or sleep.    Marland Kitchen aspirin EC 81 MG tablet Take 81 mg by mouth at bedtime.    . bisoprolol (ZEBETA) 10 MG tablet Take 1 tablet (10 mg total) by mouth daily. 90 tablet 1  . Cholecalciferol (VITAMIN D3) 5000 units CAPS Take 5,000 Units by mouth daily with breakfast.    . citalopram (CELEXA) 40 MG tablet Take 1 tablet (40 mg total) by mouth daily.    . cyclobenzaprine (FLEXERIL) 10 MG tablet Take 1 tablet (10 mg total) by mouth 3 (three) times daily as needed for muscle spasms. 90 tablet 2  . ferrous sulfate 325 (65 FE) MG tablet Take 325 mg by mouth daily.     . furosemide (LASIX) 80 MG tablet Take 80 mg in the morning and 40 mg in the evening    . gabapentin (NEURONTIN) 300 MG capsule Take 1 capsule (300 mg total) by mouth 3 (three) times daily.    Marland Kitchen ipratropium (ATROVENT) 0.02 % nebulizer solution Take 2.5 mLs (0.5 mg total) by nebulization every 4 (four) hours as needed for wheezing or shortness of breath.    . lactulose (CHRONULAC) 10 GM/15ML solution Take 45-67.5 mLs (30-45 g total) by mouth See admin instructions. One to two times a day to regulate ammonia levels 240 mL 1  . Magnesium 250 MG TABS Take 250 mg by mouth at bedtime.     . meclizine (ANTIVERT) 25 MG tablet Take 1 tablet (25 mg total) by mouth 2 (two) times daily.    . metolazone (ZAROXOLYN) 5 MG tablet Take 2.5 mg by mouth. Every other day    . ondansetron (ZOFRAN) 4 MG tablet Take 1 tablet (4 mg total) by mouth every 6 (six) hours as needed for nausea or vomiting. 30 tablet 1  . OXYGEN Inhale 2 L into the lungs continuous. 3 L with exertion    . pantoprazole (PROTONIX) 40 MG tablet Take 40 mg by mouth daily.    . potassium chloride (K-DUR,KLOR-CON) 10 MEQ tablet Take 3 tablets (30 mEq total) by mouth 3 (three) times daily. 270 tablet 11  . traMADol (ULTRAM) 50 MG tablet take 1 TABLET BY  MOUTH EVERY 8 HOURS (Patient taking differently: Take 50 mg by mouth every 8 hours as needed for pain) 90 tablet 1  . triamcinolone cream (KENALOG) 0.1 % Apply 1 application topically 3 (three) times daily as needed for itching.  0   No current facility-administered medications on file prior to visit.     Current Problems (verified) Patient Active Problem List   Diagnosis Date Noted  . Anasarca 12/14/2016  . COPD mixed type ([place]) 11/30/2016  . Obesity (BMI 30-39.9) 10/12/2016  . Fever 10/12/2016  . Hyperammonemia ([place]) 10/12/2016  . Atrial tachycardia, multifocal ([place])   . QT  prolongation 03/05/2016  . Acute renal failure superimposed on stage 3 chronic kidney disease ([place])   . Hyponatremia   . Acute on chronic respiratory failure ([place]) 01/19/2016  . DM type 2 causing CKD stage 3 ([place]) 10/30/2015  . Hepatic encephalopathy ([place]) 09/21/2015  . Hypokalemia 09/21/2015  . COPD with acute exacerbation ([place]) 08/30/2015  . CKD stage 3 due to type 2 diabetes mellitus ([place]) 08/16/2015  . Acute on chronic respiratory failure with hypoxia ([place]) 08/07/2015  . Thrombocytopenia ([place]) 08/07/2015  . Venous insufficiency 07/26/2015  . Encounter for Medicare annual wellness exam 02/16/2015  . Medication management 12/14/2013  . Essential hypertension   . Hyperlipidemia   . GERD   . Vitamin D deficiency   . IBS   . Fibromyalgia   . Esophageal varices ([place]) 07/08/2012  . COLONIC POLYPS 11/11/2008  . Anemia 05/28/2008  . Hepatic cirrhosis ([place]) 05/28/2008  . Esophagitis 05/27/2008  . Diverticulosis of large intestine 05/27/2008      Objective:   Today's Vitals   12/20/16 1521  BP: 120/82  Pulse: 72  Resp: 14  Temp: 97 F (36.1 C)  SpO2: 95%  Weight: 192 lb (87.1 kg)  Height: 5' 2"  (1.575 m)   Body mass index is 35.12 kg/m. General Appearance: Well nourished, in no apparent distress. Eyes: PERRLA, EOMs, conjunctiva no swelling or erythema Sinuses: No Frontal/maxillary  tenderness ENT/Mouth: Ext aud canals clear, TMs without erythema.Tonsils not swollen or erythematous. Neck: Supple, thyroid normal.  Respiratory: 3L O2 via [place], diffuse decreased breath sounds, BS equal bilaterally without rales, rhonchi, wheezing or stridor.  Cardio: RRR with 2/6 systolic murmur. Brisk peripheral pulses with bilateral edema, with stasis dermatitis, depdendent rubor Abdomen: Soft, + BS, Distended, LUQ/epigastric tender, no guarding, rebound, hernias, masses. Lymphatics: no lymphadenopathy Musculoskeletal: Full ROM, 4/5 strength, in wheelchair, Decreased ROM left shoulder due to pain. + tenderness T 10-11 Anterior left shin with healing scab, and erythema, no weeping.  Skin: right inguinal area very erythematous, slightly warm 8 x 3 cm area tender to touch, no discharge, no pus, no palpable lymphadenopathy. Warm, dry without rashes, lesions, ecchymosis.  Neuro: Cranial nerves intact. No cerebellar symptoms.  Psych: Awake and oriented X 3, normal affect, Insight and Judgment appropriate.    Vicie Mutters, PA-C   12/20/2016

## 2016-12-20 NOTE — Addendum Note (Signed)
Addended by: Ricci Barker on: 12/20/2016 03:06 PM   Modules accepted: Orders

## 2016-12-21 ENCOUNTER — Ambulatory Visit (HOSPITAL_COMMUNITY)
Admission: RE | Admit: 2016-12-21 | Discharge: 2016-12-21 | Disposition: A | Discharge status: Home / Self Care | Payer: PPO | Source: Ambulatory Visit | Attending: Physician Assistant | Admitting: Physician Assistant

## 2016-12-21 DIAGNOSIS — K222 Esophageal obstruction: Secondary | ICD-10-CM | POA: Insufficient documentation

## 2016-12-21 DIAGNOSIS — K224 Dyskinesia of esophagus: Secondary | ICD-10-CM | POA: Diagnosis not present

## 2016-12-21 DIAGNOSIS — R4702 Dysphasia: Secondary | ICD-10-CM | POA: Diagnosis not present

## 2016-12-21 DIAGNOSIS — R1314 Dysphagia, pharyngoesophageal phase: Secondary | ICD-10-CM | POA: Diagnosis not present

## 2016-12-21 LAB — CBC WITH DIFFERENTIAL/PLATELET
BASOS ABS: 60 {cells}/uL (ref 0–200)
BASOS PCT: 1 %
EOS ABS: 180 {cells}/uL (ref 15–500)
Eosinophils Relative: 3 %
HEMATOCRIT: 32.1 % — AB (ref 35.0–45.0)
HEMOGLOBIN: 10.4 g/dL — AB (ref 11.7–15.5)
Lymphocytes Relative: 19 %
Lymphs Abs: 1140 cells/uL (ref 850–3900)
MCH: 32.4 pg (ref 27.0–33.0)
MCHC: 32.4 g/dL (ref 32.0–36.0)
MCV: 100 fL (ref 80.0–100.0)
MONOS PCT: 7 %
MPV: 10.3 fL (ref 7.5–12.5)
Monocytes Absolute: 420 cells/uL (ref 200–950)
NEUTROS ABS: 4200 {cells}/uL (ref 1500–7800)
Neutrophils Relative %: 70 %
PLATELETS: 103 10*3/uL — AB (ref 140–400)
RBC: 3.21 MIL/uL — ABNORMAL LOW (ref 3.80–5.10)
RDW: 16.4 % — ABNORMAL HIGH (ref 11.0–15.0)
WBC: 6 10*3/uL (ref 3.8–10.8)

## 2016-12-21 LAB — HEPATIC FUNCTION PANEL
ALK PHOS: 150 U/L — AB (ref 33–130)
ALT: 20 U/L (ref 6–29)
AST: 36 U/L — ABNORMAL HIGH (ref 10–35)
Albumin: 3 g/dL — ABNORMAL LOW (ref 3.6–5.1)
BILIRUBIN DIRECT: 0.4 mg/dL — AB (ref <=0.2)
BILIRUBIN INDIRECT: 0.8 mg/dL (ref 0.2–1.2)
Total Bilirubin: 1.2 mg/dL (ref 0.2–1.2)
Total Protein: 4.8 g/dL — ABNORMAL LOW (ref 6.1–8.1)

## 2016-12-21 LAB — URINALYSIS, ROUTINE W REFLEX MICROSCOPIC
Bilirubin Urine: NEGATIVE
GLUCOSE, UA: NEGATIVE
HGB URINE DIPSTICK: NEGATIVE
Ketones, ur: NEGATIVE
Leukocytes, UA: NEGATIVE
NITRITE: NEGATIVE
PH: 7 (ref 5.0–8.0)
PROTEIN: NEGATIVE
SPECIFIC GRAVITY, URINE: 1.005 (ref 1.001–1.035)

## 2016-12-21 LAB — BASIC METABOLIC PANEL WITH GFR
BUN: 12 mg/dL (ref 7–25)
CALCIUM: 8.7 mg/dL (ref 8.6–10.4)
CO2: 38 mmol/L — AB (ref 20–31)
Chloride: 92 mmol/L — ABNORMAL LOW (ref 98–110)
Creat: 1.15 mg/dL — ABNORMAL HIGH (ref 0.60–0.93)
GFR, EST AFRICAN AMERICAN: 56 mL/min — AB (ref >=60)
GFR, EST NON AFRICAN AMERICAN: 48 mL/min — AB (ref >=60)
GLUCOSE: 111 mg/dL — AB (ref 65–99)
POTASSIUM: 2.9 mmol/L — AB (ref 3.5–5.3)
SODIUM: 139 mmol/L (ref 135–146)

## 2016-12-21 LAB — MAGNESIUM: Magnesium: 1.9 mg/dL (ref 1.5–2.5)

## 2016-12-21 LAB — URINE CULTURE

## 2016-12-21 NOTE — Progress Notes (Signed)
Pt's husband was made aware of lab results & voiced understanding of those results.

## 2016-12-24 DIAGNOSIS — H04123 Dry eye syndrome of bilateral lacrimal glands: Secondary | ICD-10-CM | POA: Diagnosis not present

## 2016-12-24 DIAGNOSIS — Z961 Presence of intraocular lens: Secondary | ICD-10-CM | POA: Diagnosis not present

## 2016-12-24 DIAGNOSIS — H10413 Chronic giant papillary conjunctivitis, bilateral: Secondary | ICD-10-CM | POA: Diagnosis not present

## 2016-12-24 NOTE — Progress Notes (Signed)
Pt aware of lab results & voiced understanding of those results.

## 2017-01-01 ENCOUNTER — Other Ambulatory Visit: Payer: Self-pay | Admitting: *Deleted

## 2017-01-01 MED ORDER — PANTOPRAZOLE SODIUM 40 MG PO TBEC: 40.0000 mg | DELAYED_RELEASE_TABLET | Freq: Every day | ORAL | 3 refills | Status: DC

## 2017-01-07 ENCOUNTER — Other Ambulatory Visit: Payer: Self-pay | Admitting: Physician Assistant

## 2017-01-07 MED ORDER — POTASSIUM CHLORIDE CRYS ER 10 MEQ PO TBCR
30.0000 meq | EXTENDED_RELEASE_TABLET | Freq: Three times a day (TID) | ORAL | 11 refills | Status: DC
Start: 2017-01-07 — End: 2017-09-28

## 2017-01-11 ENCOUNTER — Ambulatory Visit (INDEPENDENT_AMBULATORY_CARE_PROVIDER_SITE_OTHER): Payer: PPO | Admitting: Internal Medicine

## 2017-01-11 ENCOUNTER — Encounter: Payer: Self-pay | Admitting: Internal Medicine

## 2017-01-11 VITALS — BP 120/82 | HR 60 | Temp 98.1°F | Resp 16 | Ht 62.0 in | Wt 183.0 lb

## 2017-01-11 DIAGNOSIS — E876 Hypokalemia: Secondary | ICD-10-CM

## 2017-01-11 DIAGNOSIS — N183 Chronic kidney disease, stage 3 unspecified: Secondary | ICD-10-CM

## 2017-01-11 DIAGNOSIS — G629 Polyneuropathy, unspecified: Secondary | ICD-10-CM

## 2017-01-11 DIAGNOSIS — K7460 Unspecified cirrhosis of liver: Secondary | ICD-10-CM

## 2017-01-11 DIAGNOSIS — Z79899 Other long term (current) drug therapy: Secondary | ICD-10-CM | POA: Diagnosis not present

## 2017-01-11 DIAGNOSIS — I10 Essential (primary) hypertension: Secondary | ICD-10-CM | POA: Diagnosis not present

## 2017-01-11 DIAGNOSIS — E1122 Type 2 diabetes mellitus with diabetic chronic kidney disease: Secondary | ICD-10-CM

## 2017-01-11 DIAGNOSIS — M109 Gout, unspecified: Secondary | ICD-10-CM

## 2017-01-11 LAB — CBC WITH DIFFERENTIAL/PLATELET
BASOS PCT: 1 %
Basophils Absolute: 40 cells/uL (ref 0–200)
Eosinophils Absolute: 240 cells/uL (ref 15–500)
Eosinophils Relative: 6 %
HEMATOCRIT: 34.4 % — AB (ref 35.0–45.0)
HEMOGLOBIN: 10.9 g/dL — AB (ref 11.7–15.5)
LYMPHS ABS: 800 {cells}/uL — AB (ref 850–3900)
Lymphocytes Relative: 20 %
MCH: 31.6 pg (ref 27.0–33.0)
MCHC: 31.7 g/dL — ABNORMAL LOW (ref 32.0–36.0)
MCV: 99.7 fL (ref 80.0–100.0)
MONO ABS: 280 {cells}/uL (ref 200–950)
MPV: 12.5 fL (ref 7.5–12.5)
Monocytes Relative: 7 %
Neutro Abs: 2640 cells/uL (ref 1500–7800)
Neutrophils Relative %: 66 %
Platelets: 104 10*3/uL — ABNORMAL LOW (ref 140–400)
RBC: 3.45 MIL/uL — AB (ref 3.80–5.10)
RDW: 16.1 % — ABNORMAL HIGH (ref 11.0–15.0)
WBC: 4 10*3/uL (ref 3.8–10.8)

## 2017-01-11 MED ORDER — GABAPENTIN 600 MG PO TABS: ORAL_TABLET | ORAL | 1 refills | Status: DC

## 2017-01-11 NOTE — Progress Notes (Signed)
Subjective:    Patient ID: [name], female    DOB: September 26, 1945, 71 y.o.   MRN: [id number]  HPI  This unfortunate 71 yo MWF with NASH associated End Stage Cirrhosis/portal HTN and End Stage O2 dependent COPD is brought in by husband for increasing pain of her lower extremities not controlled on Gabapentin 2-3 x/day. Patient also report some twitching of her leg and recent labs (5/17)  did reveal low K+ 2.9 and her KCl supplement was increased.  Weight is stable at 184# over the last month.   Medication Sig  . albuterol (PROVENTIL HFA;VENTOLIN HFA) 108 (90 Base) MCG/ACT inhaler Inhale 2 puffs into the lungs every 6 (six) hours as needed for wheezing or shortness of breath.  Marland Kitchen albuterol (PROVENTIL) (2.5 MG/3ML) 0.083% nebulizer solution Take 2.5 mg by nebulization every 4 (four) hours as needed for wheezing or shortness of breath.   . allopurinol (ZYLOPRIM) 300 MG tablet take 1 tablet by mouth daily for gout  . ALPRAZolam (XANAX) 0.5 MG tablet Take 0.5 mg by mouth daily as needed for anxiety or sleep.  Marland Kitchen antiseptic oral rinse (BIOTENE) LIQD 15 mLs by Mouth Rinse route as needed for dry mouth.  Marland Kitchen aspirin EC 81 MG tablet Take 81 mg by mouth at bedtime.  . bisoprolol (ZEBETA) 10 MG tablet Take 1 tablet (10 mg total) by mouth daily.  . Cholecalciferol (VITAMIN D3) 5000 units CAPS Take 5,000 Units by mouth daily with breakfast.  . citalopram (CELEXA) 40 MG tablet Take 1 tablet (40 mg total) by mouth daily.  . cyclobenzaprine (FLEXERIL) 10 MG tablet Take 1 tablet (10 mg total) by mouth 3 (three) times daily as needed for muscle spasms.  . ferrous sulfate 325 (65 FE) MG tablet Take 325 mg by mouth daily.   . furosemide (LASIX) 80 MG tablet Take 80 mg in the morning and 40 mg in the evening  . ipratropium (ATROVENT) 0.02 % nebulizer solution Take 2.5 mLs (0.5 mg total) by nebulization every 4 (four) hours as needed for wheezing or shortness of breath.  . lactulose (CHRONULAC) 10 GM/15ML solution Take  45-67.5 mLs (30-45 g total) by mouth See admin instructions. One to two times a day to regulate ammonia levels  . Magnesium 250 MG TABS Take 250 mg by mouth at bedtime.   . meclizine (ANTIVERT) 25 MG tablet Take 1 tablet (25 mg total) by mouth 2 (two) times daily.  . metolazone (ZAROXOLYN) 5 MG tablet Take 2.5 mg by mouth. Every other day  . nystatin cream (MYCOSTATIN) Apply 1 application topically 2 (two) times daily.  . ondansetron (ZOFRAN) 4 MG tablet Take 1 tablet (4 mg total) by mouth every 6 (six) hours as needed for nausea or vomiting.  . OXYGEN Inhale 2 L into the lungs continuous. 3 L with exertion  . pantoprazole (PROTONIX) 40 MG tablet Take 1 tablet (40 mg total) by mouth daily.  . potassium chloride (K-DUR,KLOR-CON) 10 MEQ tablet Take 3 tablets (30 mEq total) by mouth 3 (three) times daily.  Marland Kitchen terconazole (TERAZOL 7) 0.4 % vaginal cream Place 1 applicator vaginally at bedtime.  . traMADol (ULTRAM) 50 MG tablet take 1 TABLET BY MOUTH EVERY 8 HOURS (Patient taking differently: Take 50 mg by mouth every 8 hours as needed for pain)  . triamcinolone cream (KENALOG) 0.1 % Apply 1 application topically 3 (three) times daily as needed for pain  . gabapentin (NEURONTIN) 300 MG capsule Take 1 capsule (300 mg total) by mouth 3 (  three) times daily.   Allergies  Allergen Reactions  . Atorvastatin Other (See Comments)    Doesn't remember reaction  . Diphenhydramine Hcl (Sleep) Hives  . Hydrocodone-Acetaminophen Other (See Comments)    Reaction not remembered  . Lopid [Gemfibrozil] Other (See Comments)    Doesn't remember reaction  . Loratadine Hives  . Lorazepam Hives  . Simvastatin Other (See Comments)    Reaction not remembered  . Sulfamethoxazole Hives  . Sulfonamide Derivatives Hives   Past Medical History:  Diagnosis Date  . Asthma   . COPD (chronic obstructive pulmonary disease) ([place])   . Esophageal varices ([place])   . Fibromyalgia   . Gastritis   . Hepatic cirrhosis ([place])   .  Hepatic encephalopathy ([place])   . Hyperlipidemia   . Hypertension   . IBS (irritable bowel syndrome)   . Splenomegaly   . Vitamin D deficiency    Past Surgical History:  Procedure Laterality Date  . ABDOMINAL HYSTERECTOMY    . BACK SURGERY    . CATARACT EXTRACTION, BILATERAL    . CHOLECYSTECTOMY    . ESOPHAGOGASTRODUODENOSCOPY  07/16/2012   Procedure: ESOPHAGOGASTRODUODENOSCOPY (EGD);  Surgeon: Inda Castle, MD;  Location: [facility];  Service: Endoscopy;  Laterality: N/A;  . GASTRIC VARICES BANDING  07/16/2012   Procedure: GASTRIC VARICES BANDING;  Surgeon: Inda Castle, MD;  Location: [facility];  Service: Endoscopy;  Laterality: N/A;  . HIP ARTHROPLASTY Bilateral   . IR GENERIC HISTORICAL  06/25/2016   IR RADIOLOGIST EVAL & MGMT 06/25/2016 [facility]  . IR GENERIC HISTORICAL  06/27/2016   IR KYPHO LUMBAR INC FX REDUCE BONE BX UNI/BIL CANNULATION INC/IMAGING 06/27/2016 Luanne Bras, MD [facility]  . IR GENERIC HISTORICAL  07/17/2016   IR RADIOLOGIST EVAL & MGMT 07/17/2016 [facility]  . NECK SURGERY    . SHOULDER SURGERY Right    Review of Systems  10 point systems review negative except as above.    Objective:   Physical Exam   BP 120/82   Pulse 60   Temp 98.1 F (36.7 C)   Resp 16   Ht 5' 2"  (1.575 m)   Wt 183 lb (83 kg)   BMI 33.47 kg/m    O2 sat 96% on 2 L/M  HEENT - WNL. Neck - supple.  Chest - Clear decreased BS. Abd - Rotund, soft.  Cor - Nl HS. RRR w/o sig MGR. 2-3(+) chronic edema  MS- FROM w/o deformities.  Gait Nl. Skin - pretibial papulonodular skin thickening with  Brownish stasis pigmentation  Neuro -  Nl w/o focal abnormalities.    Assessment & Plan:   1. Essential hypertension  - CBC with Differential/Platelet - BASIC METABOLIC PANEL WITH GFR  2. T2_NIDDM w/CKD3  ([place])   3. Cirrhosis of liver ([place])   4. Peripheral polyneuropathy  -  INCREASE gabapentin (NEURONTIN) 600 MG tablet; Take 1/2 to 1 tablet 3 x / day  for neuropathy pain in legs  Dispense: 270 tablet; Refill: 1  5. Hypokalemia  - BASIC METABOLIC PANEL WITH GFR  6. Gout  - Uric acid  7. Medication management  - CBC with Differential/Platelet - Magnesium - Uric acid  Over 25 minutes of exam, counseling, chart review and high complex critical decision making was performed

## 2017-01-12 LAB — BASIC METABOLIC PANEL WITH GFR
BUN: 18 mg/dL (ref 7–25)
CHLORIDE: 98 mmol/L (ref 98–110)
CO2: 30 mmol/L (ref 20–31)
Calcium: 8.8 mg/dL (ref 8.6–10.4)
Creat: 1.21 mg/dL — ABNORMAL HIGH (ref 0.60–0.93)
GFR, EST NON AFRICAN AMERICAN: 45 mL/min — AB (ref >=60)
GFR, Est African American: 52 mL/min — ABNORMAL LOW (ref >=60)
GLUCOSE: 144 mg/dL — AB (ref 65–99)
POTASSIUM: 3.7 mmol/L (ref 3.5–5.3)
Sodium: 138 mmol/L (ref 135–146)

## 2017-01-12 LAB — MAGNESIUM: Magnesium: 2 mg/dL (ref 1.5–2.5)

## 2017-01-12 LAB — URIC ACID: Uric Acid, Serum: 5.7 mg/dL (ref 2.5–7.0)

## 2017-01-15 ENCOUNTER — Ambulatory Visit (INDEPENDENT_AMBULATORY_CARE_PROVIDER_SITE_OTHER): Payer: PPO | Admitting: Gastroenterology

## 2017-01-15 ENCOUNTER — Encounter: Payer: Self-pay | Admitting: Gastroenterology

## 2017-01-15 VITALS — BP 110/58 | HR 64 | Ht 62.0 in | Wt 186.0 lb

## 2017-01-15 DIAGNOSIS — I8500 Esophageal varices without bleeding: Secondary | ICD-10-CM | POA: Diagnosis not present

## 2017-01-15 DIAGNOSIS — K7682 Hepatic encephalopathy: Secondary | ICD-10-CM

## 2017-01-15 DIAGNOSIS — K7290 Hepatic failure, unspecified without coma: Secondary | ICD-10-CM

## 2017-01-15 DIAGNOSIS — K7460 Unspecified cirrhosis of liver: Secondary | ICD-10-CM | POA: Diagnosis not present

## 2017-01-15 DIAGNOSIS — R1310 Dysphagia, unspecified: Secondary | ICD-10-CM

## 2017-01-15 DIAGNOSIS — R188 Other ascites: Principal | ICD-10-CM

## 2017-01-15 NOTE — Patient Instructions (Signed)
If you are age 70 or older, your body mass index should be between 23-30. Your Body mass index is 34.02 kg/m. If this is out of the aforementioned range listed, please consider follow up with your Primary Care Provider.  If you are age 59 or younger, your body mass index should be between 19-25. Your Body mass index is 34.02 kg/m. If this is out of the aformentioned range listed, please consider follow up with your Primary Care Provider.   You have been scheduled for a modified barium swallow on Thursday, June 21st at 9:00am. Please arrive 15 minutes prior to your test for registration. You will go to [facility] (1st Floor) for your appointment. Please refrain from eating or drinking anything 4 hours prior to your test. Should you need to cancel or reschedule your appointment, please contact [phone] [place]). _____________________________________________________________________ A Modified Barium Swallow Study, or MBS, is a special x-ray that is taken to check swallowing skills. It is carried out by a Stage manager and a Psychologist, clinical (SLP). During this test, yourmouth, throat, and esophagus, a muscular tube which connects your mouth to your stomach, is checked. The test will help you, your doctor, and the SLP plan what types of foods and liquids are easier for you to swallow. The SLP will also identify positions and ways to help you swallow more easily and safely. What will happen during an MBS? You will be taken to an x-ray room and seated comfortably. You will be asked to swallow small amounts of food and liquid mixed with barium. Barium is a liquid or paste that allows images of your mouth, throat and esophagus to be seen on x-ray. The x-ray captures moving images of the food you are swallowing as it travels from your mouth through your throat and into your esophagus. This test helps identify whether food or liquid is entering your lungs (aspiration). The  test also shows which part of your mouth or throat lacks strength or coordination to move the food or liquid in the right direction. This test typically takes 30 minutes to 1 hour to complete. _______________________________________________________________________  Please return in 1 month for lab work.  You will be contacted by letter when it is time to schedule a 3 month follow up with Dr. Havery Moros.  Thank you.

## 2017-01-15 NOTE — Progress Notes (Signed)
HPI :  71 y/o female with oxygen dependant COPD, NASH cirrhosis complicated by esophageal varices, hepatic encephalopathy, here for a follow up visit. She has been admitted a few times for pneumonia this year, also had a fall, and issues with encephalopathy over time as well.   They have not been able to afford Rifaximin so using lactulose. Husband states she is compliant with it, mental status at baseline today. She is doing okay with the edema in her legs, has some edema in the abdomen. Weight fluctuates, she was down to 180 lbs last week. She is taking lasix 58m q AM and 454mq PM, also taking Zaroxyln 2.77m74mvery other day. She has been on an atypical diuretic regimen due to electolyte issues in the past, previously on aldactone but no longer.   She complained of some dysphagia at the last visit. Barium swallow done - suspected laryngeal penetration, possible aspiration, esophageal dysmotility. Also with smooth narrowing of distal esophagus, tablet would not pass  For her varices she has been taking bisoprolol given nonselective beta blocker made COPD worse.  She reports difficulty mostly with swallowing liquids in her pharynx. She has no trouble with solid foods. She denies frank aspiration, but can have to spit up liquids at times if she can't swallow it.  US [place] 12/01/16 - no HCC, mild ascites AFP 12/11/16 normal INR 1.09  Patient does not drive  Endoscopic history EGD 10/22/13 - grace 2 varices, no stricture Colonoscopy 06/2008 - multiple colon polyps, - adenomas / tubulovillous adenoma  Past Medical History:  Diagnosis Date  . Asthma   . COPD (chronic obstructive pulmonary disease) ([facility] . Esophageal varices ([facility] . Fibromyalgia   . Gastritis   . Hepatic cirrhosis ([facility] . [place]atic encephalopathy ([facility] . Hyperlipidemia   . Hypertension   . IBS (irritable bowel syndrome)   . Splenomegaly   . Vitamin D deficiency      Past Surgical History:  Procedure  Laterality Date  . ABDOMINAL HYSTERECTOMY    . BACK SURGERY    . CATARACT EXTRACTION, BILATERAL    . CHOLECYSTECTOMY    . ESOPHAGOGASTRODUODENOSCOPY  07/16/2012   Procedure: ESOPHAGOGASTRODUODENOSCOPY (EGD);  Surgeon: RobInda CastleD;  [facility];  Service: Endoscopy;  Laterality: N/A;  . GASTRIC VARICES BANDING  07/16/2012   Procedure: GASTRIC VARICES BANDING;  Surgeon: RobInda CastleD;  Location: [facility];  Service: Endoscopy;  Laterality: N/A;  . HIP ARTHROPLASTY Bilateral   . IR GENERIC HISTORICAL  06/25/2016   IR RADIOLOGIST EVAL & MGMT 06/25/2016 [facility]  . IR GENERIC HISTORICAL  06/27/2016   IR KYPHO LUMBAR INC FX REDUCE BONE BX UNI/BIL CANNULATION INC/IMAGING 06/27/2016 [facility]  . IR GENERIC HISTORICAL  07/17/2016   IR RADIOLOGIST EVAL & MGMT 07/17/2016 [facility]  . NECK SURGERY    . SHOULDER SURGERY Right    Family History  Problem Relation Age of Onset  . Diabetes Brother        deceased  . Heart disease Sister        A Fib  . Heart disease Mother        CHF  . Atrial fibrillation Sister   . Colon cancer Neg Hx    Social History  Substance Use Topics  . Smoking status: Former Smoker    Packs/day: 1.00    Years: 50.00    Types: Cigarettes    Quit date:  11/05/2014  . Smokeless tobacco: Never Used     Comment: Quit April 2016  . Alcohol use No   Current Outpatient Prescriptions  Medication Sig Dispense Refill  . albuterol (PROVENTIL HFA;VENTOLIN HFA) 108 (90 Base) MCG/ACT inhaler Inhale 2 puffs into the lungs every 6 (six) hours as needed for wheezing or shortness of breath. 1 Inhaler 2  . albuterol (PROVENTIL) (2.5 MG/3ML) 0.083% nebulizer solution Take 2.5 mg by nebulization every 4 (four) hours as needed for wheezing or shortness of breath.     . allopurinol (ZYLOPRIM) 300 MG tablet take 1 tablet by mouth daily for gout 90 tablet 1  . ALPRAZolam (XANAX) 0.5 MG tablet Take 0.5 mg by mouth daily as  needed for anxiety or sleep.    Marland Kitchen antiseptic oral rinse (BIOTENE) LIQD 15 mLs by Mouth Rinse route as needed for dry mouth. 59 mL 2  . aspirin EC 81 MG tablet Take 81 mg by mouth at bedtime.    . bisoprolol (ZEBETA) 10 MG tablet Take 1 tablet (10 mg total) by mouth daily. 90 tablet 1  . Cholecalciferol (VITAMIN D3) 5000 units CAPS Take 5,000 Units by mouth daily with breakfast.    . citalopram (CELEXA) 40 MG tablet Take 1 tablet (40 mg total) by mouth daily.    . cyclobenzaprine (FLEXERIL) 10 MG tablet Take 1 tablet (10 mg total) by mouth 3 (three) times daily as needed for muscle spasms. 90 tablet 2  . ferrous sulfate 325 (65 FE) MG tablet Take 325 mg by mouth daily.     . furosemide (LASIX) 80 MG tablet Take 80 mg in the morning and 40 mg in the evening    . gabapentin (NEURONTIN) 600 MG tablet Take 1/2 to 1 tablet 3 x / day for neuropathy pain in legs 270 tablet 1  . ipratropium (ATROVENT) 0.02 % nebulizer solution Take 2.5 mLs (0.5 mg total) by nebulization every 4 (four) hours as needed for wheezing or shortness of breath.    . lactulose (CHRONULAC) 10 GM/15ML solution Take 45-67.5 mLs (30-45 g total) by mouth See admin instructions. One to two times a day to regulate ammonia levels 240 mL 1  . lactulose, encephalopathy, (CHRONULAC) 10 GM/15ML SOLN take 31ms TWICE DAILY (1892/60=  1  . Magnesium 250 MG TABS Take 250 mg by mouth at bedtime.     . meclizine (ANTIVERT) 25 MG tablet Take 1 tablet (25 mg total) by mouth 2 (two) times daily.    . metolazone (ZAROXOLYN) 5 MG tablet Take 2.5 mg by mouth. Every other day    . nystatin cream (MYCOSTATIN) Apply 1 application topically 2 (two) times daily. 30 g 1  . ondansetron (ZOFRAN) 4 MG tablet Take 1 tablet (4 mg total) by mouth every 6 (six) hours as needed for nausea or vomiting. 30 tablet 1  . OXYGEN Inhale 2 L into the lungs continuous. 3 L with exertion    . pantoprazole (PROTONIX) 40 MG tablet Take 1 tablet (40 mg total) by mouth daily. 30  tablet 3  . potassium chloride (K-DUR,KLOR-CON) 10 MEQ tablet Take 3 tablets (30 mEq total) by mouth 3 (three) times daily. (Patient taking differently: Take 10 mEq by mouth 3 (three) times daily. ) 270 tablet 11  . terconazole (TERAZOL 7) 0.4 % vaginal cream Place 1 applicator vaginally at bedtime. 45 g 2  . traMADol (ULTRAM) 50 MG tablet take 1 TABLET BY MOUTH EVERY 8 HOURS (Patient taking differently: Take 50 mg by mouth  every 8 hours as needed for pain) 90 tablet 1  . triamcinolone cream (KENALOG) 0.1 % Apply 1 application topically 3 (three) times daily as needed for pain 80 g 0   No current facility-administered medications for this visit.    Allergies  Allergen Reactions  . Atorvastatin Other (See Comments)    Doesn't remember reaction  . Diphenhydramine Hcl (Sleep) Hives  . Hydrocodone-Acetaminophen Other (See Comments)    Reaction not remembered  . Lopid [Gemfibrozil] Other (See Comments)    Doesn't remember reaction  . Loratadine Hives  . Lorazepam Hives  . Simvastatin Other (See Comments)    Reaction not remembered  . Sulfamethoxazole Hives  . Sulfonamide Derivatives Hives     Review of Systems: All systems reviewed and negative except where noted in HPI.    Dg Esophagus  Result Date: 12/21/2016 CLINICAL DATA:  Dysphagia.  Difficulty swallowing. EXAM: ESOPHOGRAM/BARIUM SWALLOW TECHNIQUE: Single contrast examination was performed using  thin barium. FLUOROSCOPY TIME:  Fluoroscopy Time:  2 minutes and 0 seconds Radiation Exposure Index (if provided by the fluoroscopic device): 13.1 mGy Number of Acquired Spot Images: 0 COMPARISON:  None. FINDINGS: Esophageal dysmotility with occasional disruption of the primary peristaltic wave and occasional tertiary contractions. Patient had difficulty with swallowing and had episodes of coughing. Laryngeal penetration and aspiration are suspected. Speech therapy consultation is suggested. Patient may need modified barium swallow. Mild  smooth narrowing of the distal esophagus could be a reflux stricture. The 13 mm barium pill would not pass through this area. No GE reflux was demonstrated. No esophageal mass. IMPRESSION: Suspect laryngeal penetration and possible aspiration. Recommend speech therapy consultation consideration for modified barium swallow. Esophageal dysmotility. Smooth narrowing of the distal esophagus. The 13 mm barium pill would not pass through this area. Electronically Signed   By: Marijo Sanes M.D.   On: 12/21/2016 11:57   Lab Results  Component Value Date   WBC 4.0 01/11/2017   HGB 10.9 (L) 01/11/2017   HCT 34.4 (L) 01/11/2017   MCV 99.7 01/11/2017   PLT 104 (L) 01/11/2017    Lab Results  Component Value Date   CREATININE 1.21 (H) 01/11/2017   BUN 18 01/11/2017   NA 138 01/11/2017   K 3.7 01/11/2017   CL 98 01/11/2017   CO2 30 01/11/2017    Lab Results  Component Value Date   ALT 20 12/20/2016   AST 36 (H) 12/20/2016   ALKPHOS 150 (H) 12/20/2016   BILITOT 1.2 12/20/2016    Lab Results  Component Value Date   INR 1.09 12/07/2016   INR 1.10 12/02/2016   INR 1.06 11/30/2016    .   Physical Exam: BP (!) 110/58 (BP Location: Left Arm, Patient Position: Sitting, Cuff Size: Normal)   Pulse 64   Ht 5' 2"  (1.575 m) Comment: from previous-wheelchair  Wt 186 lb (84.4 kg) Comment: weight taken at home  BMI 34.02 kg/m  Constitutional: female in no acute distress, wearing oxygen, sitting in wheelchair HEENT: Normocephalic and atraumatic. Conjunctivae are normal. No scleral icterus. Neck supple.  Cardiovascular: Normal rate, regular rhythm.  Pulmonary/chest: Effort normal and breath sounds normal, decreased BS at bases B Abdominal: Soft, mildly distended, nontender.  There are no masses palpable.  Extremities: chronic venous stasis changes, no significant edema Lymphadenopathy: No cervical adenopathy noted. Neurological: Alert and oriented to person place and time. Very faint  asterixis Skin: Skin is warm and dry. No rashes noted. Psychiatric: Normal mood and affect. Behavior is normal.  ASSESSMENT AND PLAN: 71 year old female with oxygen dependent COPD and cirrhosis here for reassessment of the following issues:  Cirrhosis - decompensated due to [facility], complicated by esophageal varices and hepatic encephalopathy. She appears stable on present dosing of lactulose and diuretics. She has significant comorbidities between this issue and her COPD, do not think she is a transplant candidate. At this time recommending following: - continue present dosing of diuretics, while an atypical regimen, it is working for her and labs stable. Would repeat BMET in a month. She did not have any significant ascites on last [place] - continue lactulose, she can't afford Rifaximin. Discussed with husband how to titrate to 3 loose BMs per day - continue bisoprolol - she has had beta blocker switched to this regimen given her COPD, nadolol was taken off. They declined EGD / banding at this time as an alternative therapy given respiratory status - continue [place] screening every 6 months, recall [place] in 05/2017 - low Na restricted diet - see me in clinic again in 3 months, call sooner with issues  Dysphagia - reviewed barium study with them. Her symptoms are dysphagia to liquids, likely orpharyngeal dysphagia, I don't think the stricture is causing her dysphagia. I'm concerned about her aspiration risk, recommend modified barium swallow. She had no evidence of Barrett's or pathology on her prior EGD in 2015, I suspect this is more than likely a benign stricture. We discussed how aggressive the patient and husband wanted to be in regards to this issue. They wanted to avoid invasive procedures if possible which I agree with. Holding off on EGD for now and will await modified barium study  History of adenomatous polyps - given comorbidities she is higher than average risk for anesthesia. In light of her  respiratory status and pneumonias this year, they wish to avoid colonoscopy surveillance at this time which is reasonable.  Labs in 1 month, f/u in clinic in 3 months or sooner with issues  Bayside Cellar, MD [facility] Pager [phone]

## 2017-01-16 ENCOUNTER — Telehealth: Payer: Self-pay | Admitting: Gastroenterology

## 2017-01-16 ENCOUNTER — Telehealth: Payer: Self-pay | Admitting: *Deleted

## 2017-01-16 NOTE — Telephone Encounter (Signed)
Per Dr Melford Aase, It is OK to continue PT for 2 more months, due to weakness. Physical therapist is aware.

## 2017-01-16 NOTE — Telephone Encounter (Signed)
Patient husband called back stating that radiology told him that Dr. Havery Moros ordered the MRI along with the barium swallow. He is wanting to know why this was ordered.

## 2017-01-16 NOTE — Telephone Encounter (Signed)
Patient's husband was confused about why we had highlighted MR lumbar test on his paperwork from here yesterday. He was not sure why an MR lumbar was ordered, I told him that Dr. Nelva Bush ordered this and to check with their office. Patient is scheduled for the barium swallow on 6/22.

## 2017-01-17 ENCOUNTER — Other Ambulatory Visit (HOSPITAL_COMMUNITY): Payer: Self-pay | Admitting: Gastroenterology

## 2017-01-17 DIAGNOSIS — Z7982 Long term (current) use of aspirin: Secondary | ICD-10-CM | POA: Diagnosis not present

## 2017-01-17 DIAGNOSIS — Z87891 Personal history of nicotine dependence: Secondary | ICD-10-CM | POA: Diagnosis not present

## 2017-01-17 DIAGNOSIS — M545 Low back pain: Secondary | ICD-10-CM | POA: Diagnosis not present

## 2017-01-17 DIAGNOSIS — J440 Chronic obstructive pulmonary disease with acute lower respiratory infection: Secondary | ICD-10-CM | POA: Diagnosis not present

## 2017-01-17 DIAGNOSIS — I129 Hypertensive chronic kidney disease with stage 1 through stage 4 chronic kidney disease, or unspecified chronic kidney disease: Secondary | ICD-10-CM | POA: Diagnosis not present

## 2017-01-17 DIAGNOSIS — J449 Chronic obstructive pulmonary disease, unspecified: Secondary | ICD-10-CM | POA: Diagnosis not present

## 2017-01-17 DIAGNOSIS — N183 Chronic kidney disease, stage 3 (moderate): Secondary | ICD-10-CM | POA: Diagnosis not present

## 2017-01-17 DIAGNOSIS — M6281 Muscle weakness (generalized): Secondary | ICD-10-CM | POA: Diagnosis not present

## 2017-01-17 DIAGNOSIS — J9611 Chronic respiratory failure with hypoxia: Secondary | ICD-10-CM | POA: Diagnosis not present

## 2017-01-17 DIAGNOSIS — R1319 Other dysphagia: Secondary | ICD-10-CM

## 2017-01-17 DIAGNOSIS — E1122 Type 2 diabetes mellitus with diabetic chronic kidney disease: Secondary | ICD-10-CM | POA: Diagnosis not present

## 2017-01-17 DIAGNOSIS — K7581 Nonalcoholic steatohepatitis (NASH): Secondary | ICD-10-CM | POA: Diagnosis not present

## 2017-01-17 NOTE — Telephone Encounter (Signed)
Informed pts husband that we only ordered a barium swallow for his wife.  There is an old order for a spine scan that Dr Nelva Bush ordered.  Clarified multiple times that we did not order scan for spine.   The only order that was placed was for the barium swallow and pts husband confirmed that he did have a date and time for that.

## 2017-01-21 ENCOUNTER — Encounter: Payer: Self-pay | Admitting: Physician Assistant

## 2017-01-21 ENCOUNTER — Ambulatory Visit (INDEPENDENT_AMBULATORY_CARE_PROVIDER_SITE_OTHER): Payer: PPO | Admitting: Physician Assistant

## 2017-01-21 VITALS — BP 116/70 | HR 76 | Temp 97.3°F | Resp 14 | Ht 62.0 in | Wt 181.0 lb

## 2017-01-21 DIAGNOSIS — K7290 Hepatic failure, unspecified without coma: Secondary | ICD-10-CM

## 2017-01-21 DIAGNOSIS — J441 Chronic obstructive pulmonary disease with (acute) exacerbation: Secondary | ICD-10-CM

## 2017-01-21 DIAGNOSIS — K7460 Unspecified cirrhosis of liver: Secondary | ICD-10-CM

## 2017-01-21 DIAGNOSIS — E1122 Type 2 diabetes mellitus with diabetic chronic kidney disease: Secondary | ICD-10-CM

## 2017-01-21 DIAGNOSIS — N183 Chronic kidney disease, stage 3 (moderate): Secondary | ICD-10-CM

## 2017-01-21 DIAGNOSIS — D696 Thrombocytopenia, unspecified: Secondary | ICD-10-CM

## 2017-01-21 DIAGNOSIS — J9621 Acute and chronic respiratory failure with hypoxia: Secondary | ICD-10-CM

## 2017-01-21 DIAGNOSIS — I471 Supraventricular tachycardia: Secondary | ICD-10-CM | POA: Diagnosis not present

## 2017-01-21 DIAGNOSIS — I10 Essential (primary) hypertension: Secondary | ICD-10-CM

## 2017-01-21 DIAGNOSIS — K7682 Hepatic encephalopathy: Secondary | ICD-10-CM

## 2017-01-21 LAB — CBC WITH DIFFERENTIAL/PLATELET
BASOS ABS: 46 {cells}/uL (ref 0–200)
Basophils Relative: 1 %
EOS ABS: 230 {cells}/uL (ref 15–500)
EOS PCT: 5 %
HEMATOCRIT: 33.7 % — AB (ref 35.0–45.0)
HEMOGLOBIN: 11.1 g/dL — AB (ref 11.7–15.5)
LYMPHS ABS: 966 {cells}/uL (ref 850–3900)
Lymphocytes Relative: 21 %
MCH: 32.7 pg (ref 27.0–33.0)
MCHC: 32.9 g/dL (ref 32.0–36.0)
MCV: 99.4 fL (ref 80.0–100.0)
MONO ABS: 276 {cells}/uL (ref 200–950)
MPV: 12.1 fL (ref 7.5–12.5)
Monocytes Relative: 6 %
NEUTROS ABS: 3082 {cells}/uL (ref 1500–7800)
NEUTROS PCT: 67 %
Platelets: 94 10*3/uL — ABNORMAL LOW (ref 140–400)
RBC: 3.39 MIL/uL — ABNORMAL LOW (ref 3.80–5.10)
RDW: 15.6 % — ABNORMAL HIGH (ref 11.0–15.0)
WBC: 4.6 10*3/uL (ref 3.8–10.8)

## 2017-01-21 LAB — TSH: TSH: 1.85 mIU/L

## 2017-01-21 MED ORDER — AMITRIPTYLINE HCL 50 MG PO TABS: ORAL_TABLET | ORAL | 0 refills | Status: DC

## 2017-01-21 MED ORDER — GLUCOSE BLOOD VI STRP: ORAL_STRIP | 12 refills | Status: DC

## 2017-01-21 NOTE — Patient Instructions (Addendum)
Mix sugar and betadine together into a slurry and put on the ulcer, follow up with Derm  Delayed Wound Closure Sometimes, your health care provider will decide to delay closing a wound for several days. This is done when the wound is badly bruised, dirty, or when it has been several hours since the injury happened. By delaying the closure of your wound, the risk of infection is reduced. Wounds that are closed in 3-7 days after being cleaned up and dressed heal just as well as those that are closed right away. Follow these instructions at home:  Rest and elevate the injured area until the pain and swelling are gone.  Have your wound checked as instructed by your health care provider. Contact a health care provider if:  You develop unusual or increased swelling or redness around the wound.  You have increasing pain or tenderness.  There is increasing fluid (drainage) or a bad smelling drainage coming from the wound. This information is not intended to replace advice given to you by your health care provider. Make sure you discuss any questions you have with your health care provider. Document Released: 07/23/2005 Document Revised: 12/29/2015 Document Reviewed: 01/20/2013 Elsevier Interactive Patient Education  2017 [employer]  Chronic Obstructive Pulmonary Disease Chronic obstructive pulmonary disease (COPD) is a common lung condition in which airflow from the lungs is limited. COPD is a general term that can be used to describe many different lung problems that limit airflow, including both chronic bronchitis and emphysema. If you have COPD, your lung function will probably never return to normal, but there are measures you can take to improve lung function and make yourself feel better. What are the causes?  Smoking (common).  Exposure to secondhand smoke.  Genetic problems.  Chronic inflammatory lung diseases or recurrent infections. What are the signs or symptoms?  Shortness of  breath, especially with physical activity.  Deep, persistent (chronic) cough with a large amount of thick mucus.  Wheezing.  Rapid breaths (tachypnea).  Gray or bluish discoloration (cyanosis) of the skin, especially in your fingers, toes, or lips.  Fatigue.  Weight loss.  Frequent infections or episodes when breathing symptoms become much worse (exacerbations).  Chest tightness. How is this diagnosed? Your health care provider will take a medical history and perform a physical examination to diagnose COPD. Additional tests for COPD may include:  Lung (pulmonary) function tests.  Chest X-ray.  CT scan.  Blood tests.  How is this treated? Treatment for COPD may include:  Inhaler and nebulizer medicines. These help manage the symptoms of COPD and make your breathing more comfortable.  Supplemental oxygen. Supplemental oxygen is only helpful if you have a low oxygen level in your blood.  Exercise and physical activity. These are beneficial for nearly all people with COPD.  Lung surgery or transplant.  Nutrition therapy to gain weight, if you are underweight.  Pulmonary rehabilitation. This may involve working with a team of health care providers and specialists, such as respiratory, occupational, and physical therapists.  Follow these instructions at home:  Take all medicines (inhaled or pills) as directed by your health care provider.  Avoid over-the-counter medicines or cough syrups that dry up your airway (such as antihistamines) and slow down the elimination of secretions unless instructed otherwise by your health care provider.  If you are a smoker, the most important thing that you can do is stop smoking. Continuing to smoke will cause further lung damage and breathing trouble. Ask your health care  provider for help with quitting smoking. He or she can direct you to community resources or hospitals that provide support.  Avoid exposure to irritants such as smoke,  chemicals, and fumes that aggravate your breathing.  Use oxygen therapy and pulmonary rehabilitation if directed by your health care provider. If you require home oxygen therapy, ask your health care provider whether you should purchase a pulse oximeter to measure your oxygen level at home.  Avoid contact with individuals who have a contagious illness.  Avoid extreme temperature and humidity changes.  Eat healthy foods. Eating smaller, more frequent meals and resting before meals may help you maintain your strength.  Stay active, but balance activity with periods of rest. Exercise and physical activity will help you maintain your ability to do things you want to do.  Preventing infection and hospitalization is very important when you have COPD. Make sure to receive all the vaccines your health care provider recommends, especially the pneumococcal and influenza vaccines. Ask your health care provider whether you need a pneumonia vaccine.  Learn and use relaxation techniques to manage stress.  Learn and use controlled breathing techniques as directed by your health care provider. Controlled breathing techniques include: 1. Pursed lip breathing. Start by breathing in (inhaling) through your nose for 1 second. Then, purse your lips as if you were going to whistle and breathe out (exhale) through the pursed lips for 2 seconds. 2. Diaphragmatic breathing. Start by putting one hand on your abdomen just above your waist. Inhale slowly through your nose. The hand on your abdomen should move out. Then purse your lips and exhale slowly. You should be able to feel the hand on your abdomen moving in as you exhale.  Learn and use controlled coughing to clear mucus from your lungs. Controlled coughing is a series of short, progressive coughs. The steps of controlled coughing are: 1. Lean your head slightly forward. 2. Breathe in deeply using diaphragmatic breathing. 3. Try to hold your breath for 3  seconds. 4. Keep your mouth slightly open while coughing twice. 5. Spit any mucus out into a tissue. 6. Rest and repeat the steps once or twice as needed. Contact a health care provider if:  You are coughing up more mucus than usual.  There is a change in the color or thickness of your mucus.  Your breathing is more labored than usual.  Your breathing is faster than usual. Get help right away if:  You have shortness of breath while you are resting.  You have shortness of breath that prevents you from: ? Being able to talk. ? Performing your usual physical activities.  You have chest pain lasting longer than 5 minutes.  Your skin color is more cyanotic than usual.  You measure low oxygen saturations for longer than 5 minutes with a pulse oximeter. This information is not intended to replace advice given to you by your health care provider. Make sure you discuss any questions you have with your health care provider. Document Released: 05/02/2005 Document Revised: 12/29/2015 Document Reviewed: 03/19/2013 Elsevier Interactive Patient Education  2017 [employer].

## 2017-01-21 NOTE — Progress Notes (Signed)
FOLLOW UP  Assessment:    Type 2 diabetes mellitus with stage 3 chronic kidney disease, without long-term current use of insulin (HCC) -cont diet and exercise - Hemoglobin A1c   Other cirrhosis of liver (HCC) -monitored by GI -cont lactulose   Hyperlipidemia -cont meds -diet and exercise - Lipid panel   Medication management - CBC with Differential/Platelet - BASIC METABOLIC PANEL WITH GFR - Hepatic function panel - Magnesium  COPD GOLD II with min reversiblity  -followed by pulmonology, continue O2  Hepatic encephalopathy (HCC) -cont lactulose - check labs as needed   Morbid Obesity -cont diet and exercise   Thrombocytopenia (HCC) -monitor with CBC  Back pain/leg pain Add on amitriptyline at night 82m, start 1/2 at night and can increase  Over 30 minutes of exam, counseling, chart review, and critical decision making was performed Future Appointments Date Time Provider [place] 01/25/2017 11:30 AM [facility] SPEECH THERAPIST [facility] None  01/25/2017 11:30 AM [facility] 01/21/2018 10:00 AM CVicie Mutters PA-C GAAM-GAAIM None     Subjective:   [name] a 71y.o. female who presents for follow up for liver failure with several complications including CHF, severe COPD, anasarca, DM2.   Has followed up with GI and cardiology recently, has had multiple medical admissions, still at home with husband. We have approached patient and husband many times regarding hospice or palliative care and they have declined. Discussed being DNR and patient and husband do agree this time. Filled out and given to the patient.  Weight has been slowly decreasing, she is on lasix 80 in the morning and 40 in the evening, getting 2.5 of the metolazone every other day, potassium is 3 pills a day and 1 extra on the days she gets metolazone, this was recently decreased.  Wt Readings from Last 3 Encounters:  01/21/17 181 lb (82.1 kg)  01/15/17 186 lb  (84.4 kg)  01/11/17 183 lb (83 kg)   Lab Results  Component Value Date   CREATININE 1.21 (H) 01/11/2017   BUN 18 01/11/2017   NA 138 01/11/2017   K 3.7 01/11/2017   CL 98 01/11/2017   CO2 30 01/11/2017   Her blood pressure has been controlled at home, today their BP is BP: 116/70 She does not workout. She denies chest pain, shortness of breath, dizziness.  She is not on cholesterol medication and denies myalgias. Her cholesterol is not at goal. The cholesterol last visit was:   Lab Results  Component Value Date   CHOL 183 08/29/2016   HDL 36 (L) 08/29/2016   LDLCALC 102 (H) 08/29/2016   TRIG 226 (H) 08/29/2016   CHOLHDL 5.1 (H) 08/29/2016   She has not been working on diet and exercise for diabetes, and denies foot ulcerations, hyperglycemia, hypoglycemia , increased appetite, nausea, paresthesia of the feet, polydipsia, polyuria, visual disturbances, vomiting and weight loss. Last A1C in the office was:  Lab Results  Component Value Date   HGBA1C 6.9 (H) 10/12/2016   Last GFR  Lab Results  Component Value Date   GFRNONAA 45 (L) 01/11/2017   Patient is on Vitamin D supplement. Lab Results  Component Value Date   VD25OH 42 04/19/2016     She follows with Dr. AHavery Moros NASH, recently followed up with him for dysphagia going to monitor due to multiple co morbidities, discussed thickening liquids and aspiration risk, she has OV 22nd with speech therapy, she has history of esophageal varices and but is on  bisoprolol rather than naldalol due to COPD. She has had several admissions for encephalopathy, she has been taking her lactulose regularly per her husband. Lab Results  Component Value Date   WBC 4.0 01/11/2017   HGB 10.9 (L) 01/11/2017   HCT 34.4 (L) 01/11/2017   MCV 99.7 01/11/2017   PLT 104 (L) 01/11/2017     Medication Review Current Outpatient Prescriptions on File Prior to Visit  Medication Sig Dispense Refill  . albuterol (PROVENTIL HFA;VENTOLIN HFA) 108 (90  Base) MCG/ACT inhaler Inhale 2 puffs into the lungs every 6 (six) hours as needed for wheezing or shortness of breath. 1 Inhaler 2  . albuterol (PROVENTIL) (2.5 MG/3ML) 0.083% nebulizer solution Take 2.5 mg by nebulization every 4 (four) hours as needed for wheezing or shortness of breath.     . allopurinol (ZYLOPRIM) 300 MG tablet take 1 tablet by mouth daily for gout 90 tablet 1  . ALPRAZolam (XANAX) 0.5 MG tablet Take 0.5 mg by mouth daily as needed for anxiety or sleep.    Marland Kitchen antiseptic oral rinse (BIOTENE) LIQD 15 mLs by Mouth Rinse route as needed for dry mouth. 59 mL 2  . aspirin EC 81 MG tablet Take 81 mg by mouth at bedtime.    . bisoprolol (ZEBETA) 10 MG tablet Take 1 tablet (10 mg total) by mouth daily. 90 tablet 1  . Cholecalciferol (VITAMIN D3) 5000 units CAPS Take 5,000 Units by mouth daily with breakfast.    . citalopram (CELEXA) 40 MG tablet Take 1 tablet (40 mg total) by mouth daily.    . cyclobenzaprine (FLEXERIL) 10 MG tablet Take 1 tablet (10 mg total) by mouth 3 (three) times daily as needed for muscle spasms. 90 tablet 2  . ferrous sulfate 325 (65 FE) MG tablet Take 325 mg by mouth daily.     . furosemide (LASIX) 80 MG tablet Take 80 mg in the morning and 40 mg in the evening    . gabapentin (NEURONTIN) 600 MG tablet Take 1/2 to 1 tablet 3 x / day for neuropathy pain in legs 270 tablet 1  . ipratropium (ATROVENT) 0.02 % nebulizer solution Take 2.5 mLs (0.5 mg total) by nebulization every 4 (four) hours as needed for wheezing or shortness of breath.    . lactulose (CHRONULAC) 10 GM/15ML solution Take 45-67.5 mLs (30-45 g total) by mouth See admin instructions. One to two times a day to regulate ammonia levels 240 mL 1  . lactulose, encephalopathy, (CHRONULAC) 10 GM/15ML SOLN take 60ms TWICE DAILY (1892/60=  1  . Magnesium 250 MG TABS Take 250 mg by mouth at bedtime.     . meclizine (ANTIVERT) 25 MG tablet Take 1 tablet (25 mg total) by mouth 2 (two) times daily.    . metolazone  (ZAROXOLYN) 5 MG tablet Take 2.5 mg by mouth. Every other day    . nystatin cream (MYCOSTATIN) Apply 1 application topically 2 (two) times daily. 30 g 1  . ondansetron (ZOFRAN) 4 MG tablet Take 1 tablet (4 mg total) by mouth every 6 (six) hours as needed for nausea or vomiting. 30 tablet 1  . OXYGEN Inhale 2 L into the lungs continuous. 3 L with exertion    . pantoprazole (PROTONIX) 40 MG tablet Take 1 tablet (40 mg total) by mouth daily. 30 tablet 3  . potassium chloride (K-DUR,KLOR-CON) 10 MEQ tablet Take 3 tablets (30 mEq total) by mouth 3 (three) times daily. (Patient taking differently: Take 10 mEq by mouth 3 (three)  times daily. ) 270 tablet 11  . terconazole (TERAZOL 7) 0.4 % vaginal cream Place 1 applicator vaginally at bedtime. 45 g 2  . traMADol (ULTRAM) 50 MG tablet take 1 TABLET BY MOUTH EVERY 8 HOURS (Patient taking differently: Take 50 mg by mouth every 8 hours as needed for pain) 90 tablet 1  . triamcinolone cream (KENALOG) 0.1 % Apply 1 application topically 3 (three) times daily as needed for pain 80 g 0   No current facility-administered medications on file prior to visit.     Current Problems (verified) Patient Active Problem List   Diagnosis Date Noted  . Anasarca 12/14/2016  . COPD mixed type ([place]) 11/30/2016  . Obesity (BMI 30-39.9) 10/12/2016  . Fever 10/12/2016  . Hyperammonemia ([place]) 10/12/2016  . Atrial tachycardia, multifocal ([place])   . QT prolongation 03/05/2016  . Acute renal failure superimposed on stage 3 chronic kidney disease ([place])   . Hyponatremia   . Acute on chronic respiratory failure ([place]) 01/19/2016  . DM type 2 causing CKD stage 3 ([place]) 10/30/2015  . Hepatic encephalopathy ([place]) 09/21/2015  . Hypokalemia 09/21/2015  . COPD with acute exacerbation ([place]) 08/30/2015  . CKD stage 3 due to type 2 diabetes mellitus ([place]) 08/16/2015  . Acute on chronic respiratory failure with hypoxia ([place]) 08/07/2015  . Thrombocytopenia ([place]) 08/07/2015  . Venous  insufficiency 07/26/2015  . Encounter for Medicare annual wellness exam 02/16/2015  . Medication management 12/14/2013  . Essential hypertension   . Hyperlipidemia   . GERD   . Vitamin D deficiency   . IBS   . Fibromyalgia   . Esophageal varices ([place]) 07/08/2012  . COLONIC POLYPS 11/11/2008  . Anemia 05/28/2008  . Hepatic cirrhosis ([place]) 05/28/2008  . Esophagitis 05/27/2008  . Diverticulosis of large intestine 05/27/2008      Objective:   Today's Vitals   01/21/17 0952  BP: 116/70  Pulse: 76  Resp: 14  Temp: 97.3 F (36.3 C)  SpO2: 95%  Weight: 181 lb (82.1 kg)  Height: 5' 2"  (1.575 m)   Body mass index is 33.11 kg/m. General Appearance: Well nourished, in no apparent distress. Eyes: PERRLA, EOMs, conjunctiva no swelling or erythema Sinuses: No Frontal/maxillary tenderness ENT/Mouth: Ext aud canals clear, TMs without erythema.Tonsils not swollen or erythematous. Neck: Supple, thyroid normal.  Respiratory: 3L O2 via [place], diffuse decreased breath sounds, BS equal bilaterally without rales, rhonchi, wheezing or stridor.  Cardio: RRR with 2/6 systolic murmur. Brisk peripheral pulses with bilateral edema, with stasis dermatitis, depdendent rubor Abdomen: Soft, + BS, Distended, LUQ/epigastric tender, no guarding, rebound, hernias, masses. Lymphatics: no lymphadenopathy Musculoskeletal: Full ROM, 4/5 strength, in wheelchair, Decreased ROM left shoulder due to pain. Skin: right inguinal area very erythematous, slightly warm 8 x 3 cm area tender to touch, no discharge, no pus, no palpable lymphadenopathy. Warm, dry without rashes, lesions, ecchymosis.  Neuro: Cranial nerves intact. No cerebellar symptoms.  Psych: Awake and oriented X 3, normal affect, Insight and Judgment appropriate.    Vicie Mutters, PA-C   01/21/2017

## 2017-01-22 ENCOUNTER — Other Ambulatory Visit: Payer: Self-pay | Admitting: Physician Assistant

## 2017-01-22 DIAGNOSIS — E876 Hypokalemia: Secondary | ICD-10-CM

## 2017-01-22 LAB — LIPID PANEL
Cholesterol: 177 mg/dL (ref <200)
HDL: 54 mg/dL (ref >50)
LDL CALC: 93 mg/dL (ref <100)
TRIGLYCERIDES: 149 mg/dL (ref <150)
Total CHOL/HDL Ratio: 3.3 Ratio (ref <5.0)
VLDL: 30 mg/dL (ref <30)

## 2017-01-22 LAB — BASIC METABOLIC PANEL WITH GFR
BUN: 13 mg/dL (ref 7–25)
CHLORIDE: 92 mmol/L — AB (ref 98–110)
CO2: 29 mmol/L (ref 20–31)
CREATININE: 1.14 mg/dL — AB (ref 0.60–0.93)
Calcium: 9 mg/dL (ref 8.6–10.4)
GFR, EST NON AFRICAN AMERICAN: 49 mL/min — AB (ref >=60)
GFR, Est African American: 56 mL/min — ABNORMAL LOW (ref >=60)
GLUCOSE: 165 mg/dL — AB (ref 65–99)
Potassium: 2.9 mmol/L — ABNORMAL LOW (ref 3.5–5.3)
SODIUM: 137 mmol/L (ref 135–146)

## 2017-01-22 LAB — HEMOGLOBIN A1C
Hgb A1c MFr Bld: 6.3 % — ABNORMAL HIGH (ref <5.7)
Mean Plasma Glucose: 134 mg/dL

## 2017-01-22 LAB — HEPATIC FUNCTION PANEL
ALT: 25 U/L (ref 6–29)
AST: 39 U/L — AB (ref 10–35)
Albumin: 3.4 g/dL — ABNORMAL LOW (ref 3.6–5.1)
Alkaline Phosphatase: 152 U/L — ABNORMAL HIGH (ref 33–130)
BILIRUBIN DIRECT: 0.3 mg/dL — AB (ref <=0.2)
BILIRUBIN INDIRECT: 0.6 mg/dL (ref 0.2–1.2)
TOTAL PROTEIN: 5.3 g/dL — AB (ref 6.1–8.1)
Total Bilirubin: 0.9 mg/dL (ref 0.2–1.2)

## 2017-01-22 LAB — MAGNESIUM: MAGNESIUM: 2 mg/dL (ref 1.5–2.5)

## 2017-01-22 NOTE — Progress Notes (Signed)
Pt's husband was made aware of lab results & voiced understanding of those results. A message was sent to front office to schedule a follow up appt.

## 2017-01-24 ENCOUNTER — Other Ambulatory Visit: Payer: Self-pay

## 2017-01-24 ENCOUNTER — Ambulatory Visit (HOSPITAL_COMMUNITY): Payer: PPO

## 2017-01-24 ENCOUNTER — Ambulatory Visit (INDEPENDENT_AMBULATORY_CARE_PROVIDER_SITE_OTHER): Payer: PPO

## 2017-01-24 DIAGNOSIS — Z79899 Other long term (current) drug therapy: Secondary | ICD-10-CM | POA: Diagnosis not present

## 2017-01-24 DIAGNOSIS — E876 Hypokalemia: Secondary | ICD-10-CM

## 2017-01-24 NOTE — Progress Notes (Signed)
Pt present for BMET lab order were already entered into chart.

## 2017-01-24 NOTE — Addendum Note (Signed)
Addended by: Elsie Amis D on: 01/24/2017 05:03 PM   Modules accepted: Orders

## 2017-01-25 ENCOUNTER — Other Ambulatory Visit (HOSPITAL_COMMUNITY): Payer: Self-pay | Admitting: Gastroenterology

## 2017-01-25 ENCOUNTER — Ambulatory Visit (HOSPITAL_COMMUNITY)
Admission: RE | Admit: 2017-01-25 | Discharge: 2017-01-25 | Disposition: A | Discharge status: Home / Self Care | Payer: PPO | Source: Ambulatory Visit | Attending: Gastroenterology | Admitting: Gastroenterology

## 2017-01-25 DIAGNOSIS — R1319 Other dysphagia: Secondary | ICD-10-CM

## 2017-01-25 LAB — BASIC METABOLIC PANEL
BUN: 18 mg/dL (ref 7–25)
CO2: 33 mmol/L — AB (ref 20–31)
Calcium: 9 mg/dL (ref 8.6–10.4)
Chloride: 95 mmol/L — ABNORMAL LOW (ref 98–110)
Creat: 1.31 mg/dL — ABNORMAL HIGH (ref 0.60–0.93)
GLUCOSE: 128 mg/dL — AB (ref 65–99)
POTASSIUM: 3.2 mmol/L — AB (ref 3.5–5.3)
SODIUM: 139 mmol/L (ref 135–146)

## 2017-01-25 NOTE — Progress Notes (Signed)
Modified Barium Swallow Progress Note  Patient Details  Name: [name] MRN: [id number] Date of Birth: May 30, 1946  Today's Date: 01/25/2017  Modified Barium Swallow completed.  Full report located under Chart Review in the Imaging Section.  Brief recommendations include the following:  Clinical Impression  Pt has a functional oropharyngeal swallow with no aspiration observed. She does have intermittent deep, flash penetration of thin liquids, and therefore SLP provided education about use of general aspiration precautions as well as the impact of respiratory function on swallowing, encouraging her to take rest breaks PRN if experiencing any dsypnea. Recommend to continue with unrestricted textures as tolerated.   Swallow Evaluation Recommendations       SLP Diet Recommendations: Regular solids;Thin liquid   Liquid Administration via: Cup;Straw   Medication Administration: Whole meds with liquid   Supervision: Patient able to self feed;Intermittent supervision to cue for compensatory strategies   Compensations: Slow rate;Small sips/bites;Follow solids with liquid   Postural Changes: Remain semi-upright after after feeds/meals (Comment);Seated upright at 90 degrees   Oral Care Recommendations: Oral care BID        Germain Osgood 01/25/2017,12:30 PM   Germain Osgood, M.A. CCC-SLP [phone]

## 2017-01-31 DIAGNOSIS — N183 Chronic kidney disease, stage 3 (moderate): Secondary | ICD-10-CM | POA: Diagnosis not present

## 2017-01-31 DIAGNOSIS — Z7982 Long term (current) use of aspirin: Secondary | ICD-10-CM | POA: Diagnosis not present

## 2017-01-31 DIAGNOSIS — R260 Ataxic gait: Secondary | ICD-10-CM | POA: Diagnosis not present

## 2017-01-31 DIAGNOSIS — J440 Chronic obstructive pulmonary disease with acute lower respiratory infection: Secondary | ICD-10-CM | POA: Diagnosis not present

## 2017-01-31 DIAGNOSIS — M545 Low back pain: Secondary | ICD-10-CM | POA: Diagnosis not present

## 2017-01-31 DIAGNOSIS — K7581 Nonalcoholic steatohepatitis (NASH): Secondary | ICD-10-CM | POA: Diagnosis not present

## 2017-01-31 DIAGNOSIS — I129 Hypertensive chronic kidney disease with stage 1 through stage 4 chronic kidney disease, or unspecified chronic kidney disease: Secondary | ICD-10-CM | POA: Diagnosis not present

## 2017-01-31 DIAGNOSIS — Z87891 Personal history of nicotine dependence: Secondary | ICD-10-CM | POA: Diagnosis not present

## 2017-01-31 DIAGNOSIS — E1122 Type 2 diabetes mellitus with diabetic chronic kidney disease: Secondary | ICD-10-CM | POA: Diagnosis not present

## 2017-01-31 DIAGNOSIS — J9611 Chronic respiratory failure with hypoxia: Secondary | ICD-10-CM | POA: Diagnosis not present

## 2017-02-05 ENCOUNTER — Other Ambulatory Visit: Payer: Self-pay | Admitting: Physician Assistant

## 2017-02-05 DIAGNOSIS — H8110 Benign paroxysmal vertigo, unspecified ear: Secondary | ICD-10-CM

## 2017-02-05 MED ORDER — FUROSEMIDE 80 MG PO TABS: 80.0000 mg | ORAL_TABLET | Freq: Two times a day (BID) | ORAL | 1 refills | Status: DC

## 2017-02-05 MED ORDER — MECLIZINE HCL 25 MG PO TABS: 25.0000 mg | ORAL_TABLET | Freq: Two times a day (BID) | ORAL | 1 refills | Status: DC

## 2017-02-07 DIAGNOSIS — J9611 Chronic respiratory failure with hypoxia: Secondary | ICD-10-CM | POA: Diagnosis not present

## 2017-02-07 DIAGNOSIS — J440 Chronic obstructive pulmonary disease with acute lower respiratory infection: Secondary | ICD-10-CM | POA: Diagnosis not present

## 2017-02-07 DIAGNOSIS — K7581 Nonalcoholic steatohepatitis (NASH): Secondary | ICD-10-CM | POA: Diagnosis not present

## 2017-02-07 DIAGNOSIS — Z7982 Long term (current) use of aspirin: Secondary | ICD-10-CM | POA: Diagnosis not present

## 2017-02-07 DIAGNOSIS — Z87891 Personal history of nicotine dependence: Secondary | ICD-10-CM | POA: Diagnosis not present

## 2017-02-07 DIAGNOSIS — R260 Ataxic gait: Secondary | ICD-10-CM | POA: Diagnosis not present

## 2017-02-07 DIAGNOSIS — E1122 Type 2 diabetes mellitus with diabetic chronic kidney disease: Secondary | ICD-10-CM | POA: Diagnosis not present

## 2017-02-07 DIAGNOSIS — Z8701 Personal history of pneumonia (recurrent): Secondary | ICD-10-CM | POA: Diagnosis not present

## 2017-02-07 DIAGNOSIS — Z9181 History of falling: Secondary | ICD-10-CM | POA: Diagnosis not present

## 2017-02-07 DIAGNOSIS — M545 Low back pain: Secondary | ICD-10-CM | POA: Diagnosis not present

## 2017-02-07 DIAGNOSIS — Z9981 Dependence on supplemental oxygen: Secondary | ICD-10-CM | POA: Diagnosis not present

## 2017-02-07 DIAGNOSIS — N183 Chronic kidney disease, stage 3 (moderate): Secondary | ICD-10-CM | POA: Diagnosis not present

## 2017-02-07 DIAGNOSIS — I129 Hypertensive chronic kidney disease with stage 1 through stage 4 chronic kidney disease, or unspecified chronic kidney disease: Secondary | ICD-10-CM | POA: Diagnosis not present

## 2017-02-13 ENCOUNTER — Telehealth: Payer: Self-pay

## 2017-02-13 NOTE — Telephone Encounter (Signed)
Pete from [facility] called to report: FYI:  [address] on Monday   Husband caught pt w/o injuries  Back is sore  Weight today is 187LB  Last Monday it was 180LB  Husband gave her a booster pill this AM.  Pt is aware to call if there are any questions or concerns.

## 2017-02-16 DIAGNOSIS — J449 Chronic obstructive pulmonary disease, unspecified: Secondary | ICD-10-CM | POA: Diagnosis not present

## 2017-02-25 ENCOUNTER — Telehealth: Payer: Self-pay

## 2017-02-25 NOTE — Telephone Encounter (Signed)
Pt's husband called to report dizzy-at night even w/2 pills a day of the [facility] for vertigo.  Per provider pt should amitriptyline & start taking Meclizine at night & if not better pt should come in for office visit.  Per pt's husband he has stopped the patient's amitriptyline since Thursday & has been giving pt meclizine.  (2 tablets) pt's husband states he would like his wife to come in for an appt. Pt was transferred to front office to schedule an office visit.

## 2017-02-26 ENCOUNTER — Encounter: Payer: Self-pay | Admitting: Internal Medicine

## 2017-02-26 ENCOUNTER — Encounter: Payer: Self-pay | Admitting: Physician Assistant

## 2017-02-26 ENCOUNTER — Ambulatory Visit (INDEPENDENT_AMBULATORY_CARE_PROVIDER_SITE_OTHER): Payer: PPO | Admitting: Physician Assistant

## 2017-02-26 VITALS — BP 136/62 | HR 80 | Temp 98.1°F | Resp 20 | Ht 62.0 in | Wt 185.0 lb

## 2017-02-26 DIAGNOSIS — Z7189 Other specified counseling: Secondary | ICD-10-CM | POA: Diagnosis not present

## 2017-02-26 DIAGNOSIS — K7460 Unspecified cirrhosis of liver: Secondary | ICD-10-CM | POA: Diagnosis not present

## 2017-02-26 DIAGNOSIS — R531 Weakness: Secondary | ICD-10-CM | POA: Diagnosis not present

## 2017-02-26 DIAGNOSIS — R42 Dizziness and giddiness: Secondary | ICD-10-CM

## 2017-02-26 DIAGNOSIS — J449 Chronic obstructive pulmonary disease, unspecified: Secondary | ICD-10-CM | POA: Diagnosis not present

## 2017-02-26 DIAGNOSIS — I8510 Secondary esophageal varices without bleeding: Secondary | ICD-10-CM | POA: Diagnosis not present

## 2017-02-26 LAB — CBC WITH DIFFERENTIAL/PLATELET
BASOS ABS: 0 {cells}/uL (ref 0–200)
Basophils Relative: 0 %
EOS ABS: 188 {cells}/uL (ref 15–500)
Eosinophils Relative: 4 %
HEMATOCRIT: 33.1 % — AB (ref 35.0–45.0)
Hemoglobin: 10.9 g/dL — ABNORMAL LOW (ref 11.7–15.5)
LYMPHS PCT: 18 %
Lymphs Abs: 846 cells/uL — ABNORMAL LOW (ref 850–3900)
MCH: 32.9 pg (ref 27.0–33.0)
MCHC: 32.9 g/dL (ref 32.0–36.0)
MCV: 100 fL (ref 80.0–100.0)
MONO ABS: 282 {cells}/uL (ref 200–950)
MONOS PCT: 6 %
MPV: 11.8 fL (ref 7.5–12.5)
NEUTROS PCT: 72 %
Neutro Abs: 3384 cells/uL (ref 1500–7800)
PLATELETS: 101 10*3/uL — AB (ref 140–400)
RBC: 3.31 MIL/uL — ABNORMAL LOW (ref 3.80–5.10)
RDW: 14.9 % (ref 11.0–15.0)
WBC: 4.7 10*3/uL (ref 3.8–10.8)

## 2017-02-26 MED ORDER — DIAZEPAM 2 MG PO TABS: 2.0000 mg | ORAL_TABLET | Freq: Two times a day (BID) | ORAL | 0 refills | Status: DC | PRN

## 2017-02-26 NOTE — Patient Instructions (Addendum)
Protonix twice a day for stomach pain and for voice  Stop meclizine  Stop the xanax Start the valium 1 mg once or twice a day, can start 1/2 twice a day and then slowly increase up to 4 mg or 2 pills a day   Dizziness Dizziness is a common problem. It is a feeling of unsteadiness or light-headedness. You may feel like you are about to faint. Dizziness can lead to injury if you stumble or fall. Anyone can become dizzy, but dizziness is more common in older adults. This condition can be caused by a number of things, including medicines, dehydration, or illness. Follow these instructions at home: Taking these steps may help with your condition: Eating and drinking  Drink enough fluid to keep your urine clear or pale yellow. This helps to keep you from becoming dehydrated. Try to drink more clear fluids, such as water.  Do not drink alcohol.  Limit your caffeine intake if directed by your health care provider.  Limit your salt intake if directed by your health care provider. Activity  Avoid making quick movements. ? Rise slowly from chairs and steady yourself until you feel okay. ? In the morning, first sit up on the side of the bed. When you feel okay, stand slowly while you hold onto something until you know that your balance is fine.  Move your legs often if you need to stand in one place for a long time. Tighten and relax your muscles in your legs while you are standing.  Do not drive or operate heavy machinery if you feel dizzy.  Avoid bending down if you feel dizzy. Place items in your home so that they are easy for you to reach without leaning over. Lifestyle  Do not use any tobacco products, including cigarettes, chewing tobacco, or electronic cigarettes. If you need help quitting, ask your health care provider.  Try to reduce your stress level, such as with yoga or meditation. Talk with your health care provider if you need help. General instructions  Watch your dizziness  for any changes.  Take medicines only as directed by your health care provider. Talk with your health care provider if you think that your dizziness is caused by a medicine that you are taking.  Tell a friend or a family member that you are feeling dizzy. If he or she notices any changes in your behavior, have this person call your health care provider.  Keep all follow-up visits as directed by your health care provider. This is important. Contact a health care provider if:  Your dizziness does not go away.  Your dizziness or light-headedness gets worse.  You feel nauseous.  You have reduced hearing.  You have new symptoms.  You are unsteady on your feet or you feel like the room is spinning. Get help right away if:  You vomit or have diarrhea and are unable to eat or drink anything.  You have problems talking, walking, swallowing, or using your arms, hands, or legs.  You feel generally weak.  You are not thinking clearly or you have trouble forming sentences. It may take a friend or family member to notice this.  You have chest pain, abdominal pain, shortness of breath, or sweating.  Your vision changes.  You notice any bleeding.  You have a headache.  You have neck pain or a stiff neck.  You have a fever. This information is not intended to replace advice given to you by your health care provider.  Make sure you discuss any questions you have with your health care provider. Document Released: 01/16/2001 Document Revised: 12/29/2015 Document Reviewed: 07/19/2014 Elsevier Interactive Patient Education  2017 [employer].

## 2017-02-26 NOTE — Progress Notes (Addendum)
Subjective:    Patient ID: [name], female    DOB: [birth year], 71 y.o.   MRN: [id number]  HPI 71 y.o. Russell with complicated history presents with dizziness. [name] has history of vertigo, has been on meclizine that has helped up until 1 week ago, did fall after the dizziness started no LOC, did not hit head. Has not been walking much, due to imbalance but also feeling weak.  [name] was taken off amitriptyline. [name] has been on 6 of potassium a day and on days take extra lasix taking 8.    Lab Results  Component Value Date   GFRNONAA 49 (L) 01/21/2017    Blood pressure 136/62, pulse 80, temperature 98.1 F (36.7 C), resp. rate 20, height 5' 2"  (1.575 m), weight 185 lb (83.9 kg).   Medications Current Outpatient Prescriptions on File Prior to Visit  Medication Sig  . albuterol (PROVENTIL HFA;VENTOLIN HFA) 108 (90 Base) MCG/ACT inhaler Inhale 2 puffs into the lungs every 6 (six) hours as needed for wheezing or shortness of breath.  Marland Kitchen albuterol (PROVENTIL) (2.5 MG/3ML) 0.083% nebulizer solution Take 2.5 mg by nebulization every 4 (four) hours as needed for wheezing or shortness of breath.   . allopurinol (ZYLOPRIM) 300 MG tablet take 1 tablet by mouth daily for gout  . ALPRAZolam (XANAX) 0.5 MG tablet Take 0.5 mg by mouth daily as needed for anxiety or sleep.  Marland Kitchen antiseptic oral rinse (BIOTENE) LIQD 15 mLs by Mouth Rinse route as needed for dry mouth.  Marland Kitchen aspirin EC 81 MG tablet Take 81 mg by mouth at bedtime.  . bisoprolol (ZEBETA) 10 MG tablet Take 1 tablet (10 mg total) by mouth daily.  . Cholecalciferol (VITAMIN D3) 5000 units CAPS Take 5,000 Units by mouth daily with breakfast.  . citalopram (CELEXA) 40 MG tablet Take 1 tablet (40 mg total) by mouth daily.  . cyclobenzaprine (FLEXERIL) 10 MG tablet Take 1 tablet (10 mg total) by mouth 3 (three) times daily as needed for muscle spasms.  . ferrous sulfate 325 (65 FE) MG tablet Take 325 mg by mouth daily.   . furosemide (LASIX) 80 MG  tablet Take 1 tablet (80 mg total) by mouth 2 (two) times daily. Take 80 mg in the morning and 40 mg in the evening  . gabapentin (NEURONTIN) 600 MG tablet Take 1/2 to 1 tablet 3 x / day for neuropathy pain in legs  . glucose blood test strip Take sugars once daily  . ipratropium (ATROVENT) 0.02 % nebulizer solution Take 2.5 mLs (0.5 mg total) by nebulization every 4 (four) hours as needed for wheezing or shortness of breath.  . lactulose (CHRONULAC) 10 GM/15ML solution Take 45-67.5 mLs (30-45 g total) by mouth See admin instructions. One to two times a day to regulate ammonia levels  . lactulose, encephalopathy, (CHRONULAC) 10 GM/15ML SOLN take 74ms TWICE DAILY (1892/60=  . Magnesium 250 MG TABS Take 250 mg by mouth at bedtime.   . meclizine (ANTIVERT) 25 MG tablet Take 1 tablet (25 mg total) by mouth 2 (two) times daily.  . metolazone (ZAROXOLYN) 5 MG tablet Take 2.5 mg by mouth. Every other day  . nystatin cream (MYCOSTATIN) Apply 1 application topically 2 (two) times daily.  . ondansetron (ZOFRAN) 4 MG tablet Take 1 tablet (4 mg total) by mouth every 6 (six) hours as needed for nausea or vomiting.  . OXYGEN Inhale 2 L into the lungs continuous. 3 L with exertion  . pantoprazole (PROTONIX) 40 MG  tablet Take 1 tablet (40 mg total) by mouth daily.  . potassium chloride (K-DUR,KLOR-CON) 10 MEQ tablet Take 3 tablets (30 mEq total) by mouth 3 (three) times daily. (Patient taking differently: Take 10 mEq by mouth 3 (three) times daily. )  . terconazole (TERAZOL 7) 0.4 % vaginal cream Place 1 applicator vaginally at bedtime.  . traMADol (ULTRAM) 50 MG tablet take 1 TABLET BY MOUTH EVERY 8 HOURS (Patient taking differently: Take 50 mg by mouth every 8 hours as needed for pain)  . triamcinolone cream (KENALOG) 0.1 % Apply 1 application topically 3 (three) times daily as needed for pain  . amitriptyline (ELAVIL) 50 MG tablet Start 1/2 at night for nerve pain, do this for 1-3 months, can increase to 1 pill  at night (Patient not taking: Reported on 02/26/2017)   No current facility-administered medications on file prior to visit.     Problem list [name] has COLONIC POLYPS; Anemia; Esophagitis; Diverticulosis of large intestine; Hepatic cirrhosis ([place]); Esophageal varices ([place]); Essential hypertension; Hyperlipidemia; GERD; Vitamin D deficiency; IBS; Fibromyalgia; Medication management; Encounter for Medicare annual wellness exam; Venous insufficiency; Acute on chronic respiratory failure with hypoxia ([place]); Thrombocytopenia ([place]); CKD stage 3 due to type 2 diabetes mellitus ([place]); COPD with acute exacerbation ([place]); Hepatic encephalopathy ([place]); Hypokalemia; DM type 2 causing CKD stage 3 ([place]); Acute on chronic respiratory failure ([place]); Acute renal failure superimposed on stage 3 chronic kidney disease ([place]); Hyponatremia; QT prolongation; Atrial tachycardia, multifocal ([place]); Obesity (BMI 30-Kerry.9); Fever; Hyperammonemia ([place]); COPD mixed type ([place]); and Anasarca on [name] problem list.   Review of Systems  Constitutional: Negative for chills, fatigue and fever.  Respiratory: Positive for shortness of breath. Negative for cough, chest tightness and wheezing.   Gastrointestinal: Positive for abdominal pain, diarrhea and nausea. Negative for anal bleeding, blood in stool, constipation and vomiting.  Genitourinary: Negative for difficulty urinating, dysuria, hematuria and urgency.  Musculoskeletal: Positive for arthralgias, back pain and myalgias.  Skin: Negative for rash.  Neurological: Positive for dizziness. Negative for tremors, seizures, syncope, facial asymmetry, speech difficulty, weakness, light-headedness, numbness and headaches.  Psychiatric/Behavioral: Negative for confusion and hallucinations.       Objective:   Physical Exam  Constitutional: [name] is oriented to person, place, and time. [name] appears well-developed and well-nourished. No distress. Nasal cannula in place.  HENT:  Head:  Normocephalic.  Mouth/Throat: Oropharynx is clear and moist. No oropharyngeal exudate.  Moon facies  Eyes: Conjunctivae are normal. No scleral icterus.  Neck: Normal range of motion. Neck supple. No JVD present. No thyromegaly present.  Cardiovascular: Normal rate, regular rhythm, normal heart sounds and intact distal pulses.  Exam reveals no gallop and no friction rub.   No murmur heard. Pulmonary/Chest: Effort normal and breath sounds normal. No respiratory distress. [name] has no wheezes. [name] has no rales. [name] exhibits no tenderness.  Abdominal: Soft. Bowel sounds are normal. [name] exhibits no distension, no fluid wave, no ascites and no mass. There is generalized tenderness. There is no rigidity, no rebound, no guarding, no tenderness at McBurney's point and negative Murphy's sign.  Abdomen tight and distended.  Musculoskeletal: Normal range of motion. [name] exhibits tenderness (appears uncomfortable in the chair due to back pain).  Patient in a wheelchair  Lymphadenopathy:    [name] has no cervical adenopathy.  Neurological: [name] is alert and oriented to person, place, and time.  Skin: Skin is warm and dry. [name] is not diaphoretic.  Psychiatric: [name] has a normal mood and affect. [name] behavior  is normal. Judgment and thought content normal.  Nursing note and vitals reviewed.      Assessment & Plan:   Vertigo Normal neuro, has history, go to ER if any changes in mentation/confusion, etc Stop xanax and meclizine, try valium -     BASIC METABOLIC PANEL WITH GFR -     CBC with Differential/Platelet -     Hepatic function panel -     diazepam (VALIUM) 2 MG tablet; Take 1 tablet (2 mg total) by mouth every 12 (twelve) hours as needed for anxiety.  Weakness Check labs  COPD mixed type (HCC) Continue O2 [place], lungs CTAB  Cirrhosis of liver without ascites, unspecified hepatic cirrhosis type ([place]) Check labs  Advanced care planning Had another long conversation with patient and husband, suggest  checking with [facility] for resources, they decline hospice at this time Patient is DNR, yellow sheet, have at home Will have discussion with kids and family, may bring in for a visit if desired.

## 2017-02-27 LAB — BASIC METABOLIC PANEL WITH GFR
BUN: 17 mg/dL (ref 7–25)
CALCIUM: 8.9 mg/dL (ref 8.6–10.4)
CHLORIDE: 92 mmol/L — AB (ref 98–110)
CO2: 35 mmol/L — AB (ref 20–31)
CREATININE: 1.18 mg/dL — AB (ref 0.60–0.93)
GFR, Est African American: 54 mL/min — ABNORMAL LOW (ref >=60)
GFR, Est Non African American: 47 mL/min — ABNORMAL LOW (ref >=60)
GLUCOSE: 209 mg/dL — AB (ref 65–99)
Potassium: 3.3 mmol/L — ABNORMAL LOW (ref 3.5–5.3)
SODIUM: 138 mmol/L (ref 135–146)

## 2017-02-27 LAB — HEPATIC FUNCTION PANEL
ALBUMIN: 3.4 g/dL — AB (ref 3.6–5.1)
ALT: 29 U/L (ref 6–29)
AST: 43 U/L — ABNORMAL HIGH (ref 10–35)
Alkaline Phosphatase: 167 U/L — ABNORMAL HIGH (ref 33–130)
BILIRUBIN INDIRECT: 0.7 mg/dL (ref 0.2–1.2)
Bilirubin, Direct: 0.4 mg/dL — ABNORMAL HIGH (ref <=0.2)
TOTAL PROTEIN: 5.4 g/dL — AB (ref 6.1–8.1)
Total Bilirubin: 1.1 mg/dL (ref 0.2–1.2)

## 2017-02-27 NOTE — Progress Notes (Signed)
Pt's husband was made aware of lab results & voiced understanding of those results.

## 2017-03-13 ENCOUNTER — Other Ambulatory Visit: Payer: Self-pay | Admitting: *Deleted

## 2017-03-13 MED ORDER — LACTULOSE 10 GM/15ML PO SOLN: 30.0000 g | ORAL | 1 refills | Status: DC

## 2017-03-19 ENCOUNTER — Telehealth: Payer: Self-pay | Admitting: Internal Medicine

## 2017-03-19 DIAGNOSIS — Z8701 Personal history of pneumonia (recurrent): Secondary | ICD-10-CM | POA: Diagnosis not present

## 2017-03-19 DIAGNOSIS — E1122 Type 2 diabetes mellitus with diabetic chronic kidney disease: Secondary | ICD-10-CM | POA: Diagnosis not present

## 2017-03-19 DIAGNOSIS — J9611 Chronic respiratory failure with hypoxia: Secondary | ICD-10-CM | POA: Diagnosis not present

## 2017-03-19 DIAGNOSIS — J449 Chronic obstructive pulmonary disease, unspecified: Secondary | ICD-10-CM | POA: Diagnosis not present

## 2017-03-19 DIAGNOSIS — Z87891 Personal history of nicotine dependence: Secondary | ICD-10-CM | POA: Diagnosis not present

## 2017-03-19 DIAGNOSIS — Z7982 Long term (current) use of aspirin: Secondary | ICD-10-CM | POA: Diagnosis not present

## 2017-03-19 DIAGNOSIS — J440 Chronic obstructive pulmonary disease with acute lower respiratory infection: Secondary | ICD-10-CM | POA: Diagnosis not present

## 2017-03-19 DIAGNOSIS — Z9981 Dependence on supplemental oxygen: Secondary | ICD-10-CM | POA: Diagnosis not present

## 2017-03-19 DIAGNOSIS — I129 Hypertensive chronic kidney disease with stage 1 through stage 4 chronic kidney disease, or unspecified chronic kidney disease: Secondary | ICD-10-CM | POA: Diagnosis not present

## 2017-03-19 DIAGNOSIS — N183 Chronic kidney disease, stage 3 (moderate): Secondary | ICD-10-CM | POA: Diagnosis not present

## 2017-03-19 DIAGNOSIS — Z9181 History of falling: Secondary | ICD-10-CM | POA: Diagnosis not present

## 2017-03-19 DIAGNOSIS — K7581 Nonalcoholic steatohepatitis (NASH): Secondary | ICD-10-CM | POA: Diagnosis not present

## 2017-03-19 DIAGNOSIS — M545 Low back pain: Secondary | ICD-10-CM | POA: Diagnosis not present

## 2017-03-19 DIAGNOSIS — R260 Ataxic gait: Secondary | ICD-10-CM | POA: Diagnosis not present

## 2017-03-19 NOTE — Telephone Encounter (Signed)
Pete with [facility] called to advise, they no longer are contracted with [employer]. Per Dr Melford Aase, will discuss with patient if new needs arise or at next office visit. Patient discharged from [facility] today.

## 2017-03-20 ENCOUNTER — Other Ambulatory Visit: Payer: Self-pay | Admitting: *Deleted

## 2017-03-20 ENCOUNTER — Other Ambulatory Visit: Payer: Self-pay | Admitting: Internal Medicine

## 2017-03-28 ENCOUNTER — Other Ambulatory Visit: Payer: Self-pay

## 2017-03-28 ENCOUNTER — Encounter: Payer: Self-pay | Admitting: Gastroenterology

## 2017-03-28 DIAGNOSIS — K7460 Unspecified cirrhosis of liver: Secondary | ICD-10-CM

## 2017-03-28 MED ORDER — LACTULOSE ENCEPHALOPATHY 10 GM/15ML PO SOLN: ORAL | 1 refills | Status: DC

## 2017-04-09 ENCOUNTER — Other Ambulatory Visit: Payer: Self-pay | Admitting: Internal Medicine

## 2017-04-09 ENCOUNTER — Other Ambulatory Visit: Payer: Self-pay

## 2017-04-10 ENCOUNTER — Telehealth: Payer: Self-pay | Admitting: Gastroenterology

## 2017-04-10 NOTE — Telephone Encounter (Signed)
Patient is scheduled for follow up visit in Oct. Do you want her to get blood work prior to visit.

## 2017-04-10 NOTE — Telephone Encounter (Signed)
CBC, CMET, INR, AFP. thanks

## 2017-04-10 NOTE — Telephone Encounter (Signed)
ENTER IN ERROR

## 2017-04-11 ENCOUNTER — Other Ambulatory Visit: Payer: Self-pay

## 2017-04-11 DIAGNOSIS — K7460 Unspecified cirrhosis of liver: Secondary | ICD-10-CM

## 2017-04-11 NOTE — Telephone Encounter (Signed)
Spoke with pts husband and he is aware, orders in epic.

## 2017-04-19 DIAGNOSIS — J449 Chronic obstructive pulmonary disease, unspecified: Secondary | ICD-10-CM | POA: Diagnosis not present

## 2017-04-22 ENCOUNTER — Other Ambulatory Visit: Payer: Self-pay | Admitting: Internal Medicine

## 2017-04-24 ENCOUNTER — Other Ambulatory Visit: Payer: Self-pay | Admitting: Internal Medicine

## 2017-04-29 ENCOUNTER — Other Ambulatory Visit: Payer: Self-pay | Admitting: Internal Medicine

## 2017-04-30 ENCOUNTER — Other Ambulatory Visit: Payer: Self-pay | Admitting: Internal Medicine

## 2017-05-01 ENCOUNTER — Ambulatory Visit (INDEPENDENT_AMBULATORY_CARE_PROVIDER_SITE_OTHER): Payer: PPO

## 2017-05-01 DIAGNOSIS — Z23 Encounter for immunization: Secondary | ICD-10-CM | POA: Diagnosis not present

## 2017-05-01 DIAGNOSIS — Z79899 Other long term (current) drug therapy: Secondary | ICD-10-CM | POA: Diagnosis not present

## 2017-05-01 LAB — CBC WITH DIFFERENTIAL/PLATELET
BASOS PCT: 0.2 %
Basophils Absolute: 8 cells/uL (ref 0–200)
EOS PCT: 3.9 %
Eosinophils Absolute: 160 cells/uL (ref 15–500)
HEMATOCRIT: 30 % — AB (ref 35.0–45.0)
HEMOGLOBIN: 9.9 g/dL — AB (ref 11.7–15.5)
LYMPHS ABS: 841 {cells}/uL — AB (ref 850–3900)
MCH: 32 pg (ref 27.0–33.0)
MCHC: 33 g/dL (ref 32.0–36.0)
MCV: 97.1 fL (ref 80.0–100.0)
MPV: 12.8 fL — AB (ref 7.5–12.5)
Monocytes Relative: 7.3 %
NEUTROS ABS: 2792 {cells}/uL (ref 1500–7800)
NEUTROS PCT: 68.1 %
PLATELETS: 84 10*3/uL — AB (ref 140–400)
RBC: 3.09 10*6/uL — AB (ref 3.80–5.10)
RDW: 14.4 % (ref 11.0–15.0)
Total Lymphocyte: 20.5 %
WBC: 4.1 10*3/uL (ref 3.8–10.8)
WBCMIX: 299 {cells}/uL (ref 200–950)

## 2017-05-01 LAB — BASIC METABOLIC PANEL WITH GFR
BUN / CREAT RATIO: 15 (calc) (ref 6–22)
BUN: 22 mg/dL (ref 7–25)
CO2: 35 mmol/L — ABNORMAL HIGH (ref 20–32)
Calcium: 9 mg/dL (ref 8.6–10.4)
Chloride: 97 mmol/L — ABNORMAL LOW (ref 98–110)
Creat: 1.43 mg/dL — ABNORMAL HIGH (ref 0.60–0.93)
GFR, Est African American: 43 mL/min/{1.73_m2} — ABNORMAL LOW (ref >=60)
GFR, Est Non African American: 37 mL/min/{1.73_m2} — ABNORMAL LOW (ref >=60)
GLUCOSE: 113 mg/dL — AB (ref 65–99)
Potassium: 3.5 mmol/L (ref 3.5–5.3)
Sodium: 141 mmol/L (ref 135–146)

## 2017-05-07 ENCOUNTER — Other Ambulatory Visit: Payer: Self-pay | Admitting: Physician Assistant

## 2017-05-07 MED ORDER — TRAMADOL HCL 50 MG PO TABS: ORAL_TABLET | ORAL | 1 refills | Status: DC

## 2017-05-07 NOTE — Progress Notes (Signed)
Tramadol was called into pharmacy on Oct 2nd 2018 by DD

## 2017-05-19 DIAGNOSIS — J449 Chronic obstructive pulmonary disease, unspecified: Secondary | ICD-10-CM | POA: Diagnosis not present

## 2017-05-26 DIAGNOSIS — J449 Chronic obstructive pulmonary disease, unspecified: Secondary | ICD-10-CM | POA: Diagnosis not present

## 2017-05-31 ENCOUNTER — Other Ambulatory Visit (INDEPENDENT_AMBULATORY_CARE_PROVIDER_SITE_OTHER): Payer: PPO

## 2017-05-31 ENCOUNTER — Ambulatory Visit (INDEPENDENT_AMBULATORY_CARE_PROVIDER_SITE_OTHER): Payer: PPO | Admitting: Physician Assistant

## 2017-05-31 ENCOUNTER — Other Ambulatory Visit: Payer: Self-pay | Admitting: Physician Assistant

## 2017-05-31 VITALS — BP 112/72 | HR 87 | Temp 97.5°F | Resp 14 | Ht 62.0 in | Wt 185.0 lb

## 2017-05-31 DIAGNOSIS — K7460 Unspecified cirrhosis of liver: Secondary | ICD-10-CM

## 2017-05-31 DIAGNOSIS — N183 Chronic kidney disease, stage 3 unspecified: Secondary | ICD-10-CM

## 2017-05-31 DIAGNOSIS — K589 Irritable bowel syndrome without diarrhea: Secondary | ICD-10-CM

## 2017-05-31 DIAGNOSIS — D696 Thrombocytopenia, unspecified: Secondary | ICD-10-CM | POA: Diagnosis not present

## 2017-05-31 DIAGNOSIS — Z79899 Other long term (current) drug therapy: Secondary | ICD-10-CM

## 2017-05-31 DIAGNOSIS — N6310 Unspecified lump in the right breast, unspecified quadrant: Secondary | ICD-10-CM

## 2017-05-31 DIAGNOSIS — K7290 Hepatic failure, unspecified without coma: Secondary | ICD-10-CM

## 2017-05-31 DIAGNOSIS — I8510 Secondary esophageal varices without bleeding: Secondary | ICD-10-CM

## 2017-05-31 DIAGNOSIS — Z0000 Encounter for general adult medical examination without abnormal findings: Secondary | ICD-10-CM | POA: Diagnosis not present

## 2017-05-31 DIAGNOSIS — I10 Essential (primary) hypertension: Secondary | ICD-10-CM

## 2017-05-31 DIAGNOSIS — K2100 Gastro-esophageal reflux disease with esophagitis, without bleeding: Secondary | ICD-10-CM

## 2017-05-31 DIAGNOSIS — L03116 Cellulitis of left lower limb: Secondary | ICD-10-CM

## 2017-05-31 DIAGNOSIS — I872 Venous insufficiency (chronic) (peripheral): Secondary | ICD-10-CM

## 2017-05-31 DIAGNOSIS — K209 Esophagitis, unspecified without bleeding: Secondary | ICD-10-CM

## 2017-05-31 DIAGNOSIS — G8929 Other chronic pain: Secondary | ICD-10-CM

## 2017-05-31 DIAGNOSIS — M797 Fibromyalgia: Secondary | ICD-10-CM

## 2017-05-31 DIAGNOSIS — Z0001 Encounter for general adult medical examination with abnormal findings: Secondary | ICD-10-CM

## 2017-05-31 DIAGNOSIS — J449 Chronic obstructive pulmonary disease, unspecified: Secondary | ICD-10-CM

## 2017-05-31 DIAGNOSIS — E669 Obesity, unspecified: Secondary | ICD-10-CM

## 2017-05-31 DIAGNOSIS — D649 Anemia, unspecified: Secondary | ICD-10-CM | POA: Diagnosis not present

## 2017-05-31 DIAGNOSIS — E1122 Type 2 diabetes mellitus with diabetic chronic kidney disease: Secondary | ICD-10-CM

## 2017-05-31 DIAGNOSIS — D126 Benign neoplasm of colon, unspecified: Secondary | ICD-10-CM

## 2017-05-31 DIAGNOSIS — M5441 Lumbago with sciatica, right side: Secondary | ICD-10-CM

## 2017-05-31 DIAGNOSIS — M5442 Lumbago with sciatica, left side: Secondary | ICD-10-CM

## 2017-05-31 DIAGNOSIS — R601 Generalized edema: Secondary | ICD-10-CM

## 2017-05-31 DIAGNOSIS — J441 Chronic obstructive pulmonary disease with (acute) exacerbation: Secondary | ICD-10-CM

## 2017-05-31 DIAGNOSIS — E785 Hyperlipidemia, unspecified: Secondary | ICD-10-CM

## 2017-05-31 DIAGNOSIS — K5730 Diverticulosis of large intestine without perforation or abscess without bleeding: Secondary | ICD-10-CM

## 2017-05-31 DIAGNOSIS — K7682 Hepatic encephalopathy: Secondary | ICD-10-CM

## 2017-05-31 DIAGNOSIS — E559 Vitamin D deficiency, unspecified: Secondary | ICD-10-CM

## 2017-05-31 DIAGNOSIS — E7220 Disorder of urea cycle metabolism, unspecified: Secondary | ICD-10-CM

## 2017-05-31 DIAGNOSIS — R9431 Abnormal electrocardiogram [ECG] [EKG]: Secondary | ICD-10-CM

## 2017-05-31 DIAGNOSIS — I471 Supraventricular tachycardia: Secondary | ICD-10-CM

## 2017-05-31 DIAGNOSIS — J9611 Chronic respiratory failure with hypoxia: Secondary | ICD-10-CM

## 2017-05-31 DIAGNOSIS — E876 Hypokalemia: Secondary | ICD-10-CM

## 2017-05-31 LAB — COMPREHENSIVE METABOLIC PANEL
ALK PHOS: 118 U/L — AB (ref 39–117)
ALT: 18 U/L (ref 0–35)
AST: 30 U/L (ref 0–37)
Albumin: 3.5 g/dL (ref 3.5–5.2)
BUN: 23 mg/dL (ref 6–23)
CHLORIDE: 98 meq/L (ref 96–112)
CO2: 39 mEq/L — ABNORMAL HIGH (ref 19–32)
CREATININE: 1.38 mg/dL — AB (ref 0.40–1.20)
Calcium: 9.1 mg/dL (ref 8.4–10.5)
GFR: 40.11 mL/min — ABNORMAL LOW (ref >60.00)
GLUCOSE: 103 mg/dL — AB (ref 70–99)
POTASSIUM: 4.5 meq/L (ref 3.5–5.1)
SODIUM: 140 meq/L (ref 135–145)
TOTAL PROTEIN: 5.7 g/dL — AB (ref 6.0–8.3)
Total Bilirubin: 1 mg/dL (ref 0.2–1.2)

## 2017-05-31 LAB — CBC WITH DIFFERENTIAL/PLATELET
BASOS PCT: 0.7 % (ref 0.0–3.0)
Basophils Absolute: 0 10*3/uL (ref 0.0–0.1)
EOS PCT: 2.9 % (ref 0.0–5.0)
Eosinophils Absolute: 0.1 10*3/uL (ref 0.0–0.7)
HCT: 31.8 % — ABNORMAL LOW (ref 36.0–46.0)
Hemoglobin: 10.3 g/dL — ABNORMAL LOW (ref 12.0–15.0)
LYMPHS ABS: 0.8 10*3/uL (ref 0.7–4.0)
Lymphocytes Relative: 17.6 % (ref 12.0–46.0)
MCHC: 32.3 g/dL (ref 30.0–36.0)
MCV: 102.8 fl — ABNORMAL HIGH (ref 78.0–100.0)
MONO ABS: 0.3 10*3/uL (ref 0.1–1.0)
Monocytes Relative: 5.9 % (ref 3.0–12.0)
NEUTROS PCT: 72.9 % (ref 43.0–77.0)
Neutro Abs: 3.2 10*3/uL (ref 1.4–7.7)
Platelets: 80 10*3/uL — ABNORMAL LOW (ref 150.0–400.0)
RBC: 3.09 Mil/uL — AB (ref 3.87–5.11)
RDW: 16.6 % — AB (ref 11.5–15.5)
WBC: 4.4 10*3/uL (ref 4.0–10.5)

## 2017-05-31 MED ORDER — DOXYCYCLINE HYCLATE 100 MG PO CAPS: ORAL_CAPSULE | ORAL | 0 refills | Status: DC

## 2017-05-31 NOTE — Patient Instructions (Addendum)
[facility]  Try CBT oil or cannibis oil from [facility] [phone]  Follow up with ortho about your back  Hospice Introduction Hospice is a service that is designed to provide people who are terminally ill and their families with medical, spiritual, and psychological support. Its aim is to improve your quality of life by keeping you as alert and comfortable as possible. Who will be my providers when I begin hospice care? Hospice teams often include:  A nurse.  A doctor. The hospice doctor will be available for your care, but you can bring your regular doctor or nurse practitioner.  Social workers.  Religious leaders (such as a Clinical biochemist).  Trained volunteers.  What roles will providers play in my care? Hospice is performed by a team of health care professionals and volunteers who:  Help keep you comfortable: ? Hospice can be provided in your home or in a homelike setting. ? The hospice staff works with your family and friends to help meet your needs. ? You will enjoy the support of loved ones by receiving much of your basic care from family and friends.  Provide pain relief and manage your symptoms. The staff supply all necessary medicines and equipment.  Provide companionship when you are alone.  Allow you and your family to rest. They may do light housekeeping, prepare meals, and run errands.  Provide counseling. They will make sure your emotional, spiritual, and social needs and those of your family are being met.  Provide spiritual care: ? Spiritual care will be individualized to meet your needs and your family's needs. ? Spiritual care may involve:  Helping you look at what death means to you.  Helping you say goodbye to your family and friends.  Performing a specific religious ceremony or ritual.  When should hospice care begin? Most people who use hospice are believed to have fewer than 6 months to live.  Your family and health care  providers can help you decide when hospice services should begin.  If your condition improves, you may discontinue the program.  What should I consider before selecting a program? Most hospice programs are run by nonprofit, independent organizations. Some are affiliated with hospitals, nursing homes, or home health care agencies. Hospice programs can take place in the home or at a hospice center, hospital, or skilled nursing facility. When choosing a hospice program, ask the following questions:  What services are available to me?  What services will be offered to my loved ones?  How involved will my loved ones be?  How involved will my health care provider be?  Who makes up the hospice care team? How are they trained or screened?  How will my pain and symptoms be managed?  If my circumstances change, can the services be provided in a different setting, such as my home or in the hospital?  Is the program reviewed and licensed by the state or certified in some other way?  Where can I learn more about hospice? You can learn about existing hospice programs in your area from your health care providers. You can also read more about hospice online. The websites of the following organizations contain helpful information:  The [facility] and [facility] Care Organization [facility]).  The Hospice Association of America ([place]).  The [place].  The American Cancer Society (ACS).  Hospice Net.  This information is not intended to replace advice given to you by your health care provider. Make sure you discuss any  questions you have with your health care provider. Document Released: 11/09/2003 Document Revised: 03/08/2016 Document Reviewed: 06/02/2013 Elsevier Interactive Patient Education  2017 [employer].

## 2017-05-31 NOTE — Progress Notes (Addendum)
CPE AND FOLLOW UP  Assessment:   COPD exacerbation ([place]) Continue meds If not better go to ER -     doxycycline (VIBRAMYCIN) 100 MG capsule; Take 1 capsule twice daily with food  Secondary esophageal varices without bleeding ([place]) continue follow up GI Continue meds  Atrial tachycardia, multifocal (HCC) Continue O2, continue to monitor  Chronic respiratory failure with hypoxia (HCC)  COPD mixed type (HCC)  Cirrhosis of liver without ascites, unspecified hepatic cirrhosis type ([place]) continue follow up GI Continue meds -     CBC with Differential/Platelet -     Hepatic function panel  CKD stage 3 due to type 2 diabetes mellitus ([place]) Discussed general issues about diabetes pathophysiology and management., Educational material distributed., Suggested low cholesterol diet., Encouraged aerobic exercise., Discussed foot care., Reminded to get yearly retinal exam. -     BASIC METABOLIC PANEL WITH GFR -     Lipid panel -     Hemoglobin A1c -     Urinalysis, Routine w reflex microscopic -     Microalbumin / creatinine urine ratio -     Uric acid  Type 2 diabetes mellitus with stage 3 chronic kidney disease, without long-term current use of insulin ([place]) Discussed general issues about diabetes pathophysiology and management., Educational material distributed., Suggested low cholesterol diet., Encouraged aerobic exercise., Discussed foot care., Reminded to get yearly retinal exam. -     Hemoglobin A1c  Hepatic encephalopathy (HCC) Continue lactulose -     Hepatic function panel -     Ammonia  Thrombocytopenia (HCC) Monitor CBC -     CBC with Differential/Platelet  Hyperammonemia (HCC) Continue lactulose -     Ammonia  Essential hypertension - continue medications, DASH diet, exercise and monitor at home. Call if greater than 130/80.  -     CBC with Differential/Platelet -     BASIC METABOLIC PANEL WITH GFR -     Hepatic function panel -     TSH -     Urinalysis, Routine  w reflex microscopic -     Microalbumin / creatinine urine ratio  Anemia, unspecified type monitor -     CBC with Differential/Platelet -     Iron,Total/Total Iron Binding Cap -     Vitamin B12  Hyperlipidemia, unspecified hyperlipidemia type -continue medications, check lipids, decrease fatty foods, increase activity.  -     Lipid panel  Medication management -     Magnesium  Vitamin D deficiency -     VITAMIN D 25 Hydroxy (Vit-D Deficiency, Fractures)  Hypokalemia Due to medications, check Mag and potassium  QT prolongation Monitor, avoid meds that prolong QT, check electrolytes.  Anasarca Continue lasix/meds, fluid restrict  Venous insufficiency Elevate legs  Benign neoplasm of colon, unspecified part of colon Monitor  Esophagitis Monitor  GERD Continue PPI/H2 blocker, diet discussed  Irritable bowel syndrome, unspecified type If not on benefiber then add it, decrease stress,  if any worsening symptoms, blood in stool, AB pain, etc call office  Diverticulosis of large intestine without hemorrhage Increase fiber  Fibromyalgia Continue mediations  Encounter for Medicare annual wellness exam 1 YEAR  Obesity (BMI 30-39.9) - long discussion about weight loss, diet, and exercise  Breast mass, right -     US BREAST LTD UNI RIGHT INC AXILLA; Future - CAN NOT DO MAMMOGRAM- PATIENT CAN NOT STAND AND SEVERE PAIN - can do open MGM/painless MGM  Chronic low back pain with bilateral sciatica, unspecified back pain laterality Follow up  with ortho ASAP  Cellulitis of left foot -     doxycycline (VIBRAMYCIN) 100 MG capsule; Take 1 capsule twice daily with food - may need xray if not better   LONG DISCUSSION WITH HUSBAND AND PATIENT ABOUT PATIENTS CHRONIC CO MORBID CONDITIONS, DECLINES HOSPICE AT THIS TIME, WILL CONTINUE TO DISCUSS  Over 40 minutes of exam, counseling, chart review, and critical decision making was performed Future Appointments Date Time  Provider [place]  06/04/2017 1:30 PM Armbruster, Carlota Raspberry, MD [facility]  06/09/2018 10:00 AM Vicie Mutters, PA-C [facility] None     Subjective:   [name] is a 71 y.o. female who presents for CPE and follow up for multitude of chronic medical co morbidities.  She follows with Dr. Havery Moros, NASH, she has history of esophageal varices and but is on bisoprolol rather than naldalol due to lung function. She has had several admissions for encephalopathy, she has been taking her lactulose per her husband, did not give lactulose yesterday.  She has been having nausea, worse at night and has been very very tired, occ on 3 L of fluid. She has had pain in her right foot and legs are "jerking". She had cementing in Nov last year for compression fracture, still in a wheelchair due to severe back pain, has not had an injection for a long time, states currently she can not stand due to pain in her back, she is on gabapentin 62m TID, no tramadol, tylenol 1 at night. .  requires help for ADL's from husband.  [facility], HAVE DISCUSSED. She can not stand for MGM and due to pain but has been having right breast pain and right lump near axilla, will try to get [place].  She also has end stage COPD, on chronic O2 on 2L occ on 3 L if she is moving more BUT HAS ALSO HAD WORSENING COUGH AND MORE MUCUS, has diastolic CHF/right sided heart failure due to this, on lasix 80 mg in the AM and 40 in the evening, on potassium pill 7 pills a day, last metolazone was given Mon or Tues, has had history of low potassium in the past due to medicaitons.  Wt Readings from Last 3 Encounters:  05/31/17 185 lb (83.9 kg)  02/26/17 185 lb (83.9 kg)  01/21/17 181 lb (82.1 kg)   Her blood pressure has been controlled at home, today their BP is BP: 112/72 She does not workout. She denies chest pain, shortness of breath, dizziness.  She is not on cholesterol medication and denies myalgias. Her  cholesterol is not at goal. The cholesterol last visit was:   Lab Results  Component Value Date   CHOL 177 01/21/2017   HDL 54 01/21/2017   LDLCALC 93 01/21/2017   TRIG 149 01/21/2017   CHOLHDL 3.3 01/21/2017   She has not been working on diet and exercise for diabetes with CKD, and denies foot ulcerations, hyperglycemia, hypoglycemia , increased appetite, nausea, paresthesia of the feet, polydipsia, polyuria, visual disturbances, vomiting and weight loss. Last A1C in the office was:  Lab Results  Component Value Date   HGBA1C 6.3 (H) 01/21/2017   Last GFR Lab Results  Component Value Date   GFRNONAA 37 (L) 05/01/2017   Patient is on Vitamin D supplement. Lab Results  Component Value Date   VD25OH 42 04/19/2016     Patient is on allopurinol for gout and does not report a recent flare.  Lab Results  Component Value Date  LABURIC 5.7 01/11/2017   She is on celexa for depression, in partial remission.  BMI is Body mass index is 33.84 kg/m., she is working on diet and exercise. Wt Readings from Last 3 Encounters:  05/31/17 185 lb (83.9 kg)  02/26/17 185 lb (83.9 kg)  01/21/17 181 lb (82.1 kg)    Medication Review Current Outpatient Prescriptions on File Prior to Visit  Medication Sig Dispense Refill  . albuterol (PROVENTIL HFA;VENTOLIN HFA) 108 (90 Base) MCG/ACT inhaler Inhale 2 puffs into the lungs every 6 (six) hours as needed for wheezing or shortness of breath. 1 Inhaler 2  . albuterol (PROVENTIL) (2.5 MG/3ML) 0.083% nebulizer solution Take 2.5 mg by nebulization every 4 (four) hours as needed for wheezing or shortness of breath.     . allopurinol (ZYLOPRIM) 300 MG tablet take 1/2-1 tablet by mouth daily for gout 90 tablet 1  . antiseptic oral rinse (BIOTENE) LIQD 15 mLs by Mouth Rinse route as needed for dry mouth. 59 mL 2  . aspirin EC 81 MG tablet Take 81 mg by mouth at bedtime.    . bisoprolol (ZEBETA) 10 MG tablet Take 1 tablet (10 mg total) by mouth daily. 90  tablet 0  . Cholecalciferol (VITAMIN D3) 5000 units CAPS Take 5,000 Units by mouth daily with breakfast.    . citalopram (CELEXA) 40 MG tablet Take 1 tablet (40 mg total) by mouth daily.    . cyclobenzaprine (FLEXERIL) 10 MG tablet Take 1 tablet (10 mg total) by mouth 3 (three) times daily as needed for muscle spasms. 90 tablet 2  . ferrous sulfate 325 (65 FE) MG tablet Take 325 mg by mouth daily.     . furosemide (LASIX) 80 MG tablet Take 1 tablet (80 mg total) by mouth 2 (two) times daily. Take 80 mg in the morning and 40 mg in the evening 180 tablet 1  . gabapentin (NEURONTIN) 600 MG tablet Take 1/2 to 1 tablet 3 x / day for neuropathy pain in legs 270 tablet 1  . glucose blood test strip Take sugars once daily 50 each 12  . ipratropium (ATROVENT) 0.02 % nebulizer solution use 1 vial in nebulizer EVERY 4 HOURS AS NEEDED FOR WHEEZING OR SHORTNESS OF BREATH 75/15=5 75 mL 3  . lactulose (CHRONULAC) 10 GM/15ML solution Take 45-67.5 mLs (30-45 g total) by mouth See admin instructions. One to two times a day to regulate ammonia levels 240 mL 1  . lactulose, encephalopathy, (CHRONULAC) 10 GM/15ML SOLN take 63ms TWICE DAILY (1892/60= 2700 mL 1  . Magnesium 250 MG TABS Take 250 mg by mouth at bedtime.     .Marland Kitchennystatin cream (MYCOSTATIN) Apply 1 application topically 2 (two) times daily. 30 g 1  . ondansetron (ZOFRAN) 4 MG tablet Take 1 tablet (4 mg total) by mouth every 6 (six) hours as needed for nausea or vomiting. 30 tablet 1  . OXYGEN Inhale 2 L into the lungs continuous. 3 L with exertion    . pantoprazole (PROTONIX) 40 MG tablet Take 1 tablet (40 mg total) by mouth daily. 90 tablet 3  . potassium chloride (K-DUR,KLOR-CON) 10 MEQ tablet Take 3 tablets (30 mEq total) by mouth 3 (three) times daily. (Patient taking differently: Take 10 mEq by mouth 3 (three) times daily. ) 270 tablet 11  . terconazole (TERAZOL 7) 0.4 % vaginal cream Place 1 applicator vaginally at bedtime. 45 g 2  . traMADol (ULTRAM)  50 MG tablet Take 50 mg by mouth every 8 hours  as needed for pain 90 tablet 1  . triamcinolone cream (KENALOG) 0.1 % Apply 1 application topically 3 (three) times daily as needed for pain 80 g 0  . diazepam (VALIUM) 2 MG tablet Take 1 tablet (2 mg total) by mouth every 12 (twelve) hours as needed for anxiety. (Patient not taking: Reported on 05/31/2017) 60 tablet 0  . metolazone (ZAROXOLYN) 5 MG tablet Take 2.5 mg by mouth. Every other day     No current facility-administered medications on file prior to visit.     Current Problems (verified) Patient Active Problem List   Diagnosis Date Noted  . Anasarca 12/14/2016  . COPD mixed type ([place]) 11/30/2016  . Obesity (BMI 30-39.9) 10/12/2016  . Hyperammonemia ([place]) 10/12/2016  . Atrial tachycardia, multifocal ([place])   . QT prolongation 03/05/2016  . Chronic respiratory failure ([place]) 01/19/2016  . DM type 2 causing CKD stage 3 ([place]) 10/30/2015  . Hepatic encephalopathy ([place]) 09/21/2015  . Hypokalemia 09/21/2015  . CKD stage 3 due to type 2 diabetes mellitus ([place]) 08/16/2015  . Thrombocytopenia ([place]) 08/07/2015  . Venous insufficiency 07/26/2015  . Encounter for Medicare annual wellness exam 02/16/2015  . Medication management 12/14/2013  . Essential hypertension   . Hyperlipidemia   . GERD   . Vitamin D deficiency   . IBS   . Fibromyalgia   . Esophageal varices ([place]) 07/08/2012  . COLONIC POLYPS 11/11/2008  . Anemia 05/28/2008  . Hepatic cirrhosis ([place]) 05/28/2008  . Esophagitis 05/27/2008  . Diverticulosis of large intestine 05/27/2008    Screening Tests Immunization History  Administered Date(s) Administered  . Hep A / Hep B 11/05/2013, 11/12/2013, 12/07/2013, 12/31/2014  . Hepatitis B, adult 12/19/2015  . Hepatitis B, ped/adol 11/17/2015, 05/21/2016  . Influenza Split 05/26/2013, 06/17/2014  . Influenza, High Dose Seasonal PF 06/15/2015, 05/01/2017  . Influenza,inj,quad, With Preservative 05/22/2016  . Pneumococcal  Conjugate-13 07/22/2015  . Pneumococcal Polysaccharide-23 08/28/2013  . Tdap 11/19/2012    Preventative care: Last colonoscopy: 2015 Last mammogram: 2012 per epic chart review, is followed by Dr. Lindi Adie for breast cancer history, she is unable to stand for [facility], will try to get [place]  DEXA:2016  Prior vaccinations: TD or Tdap: 2014  Influenza: 2016  Pneumococcal: 2015 Prevnar13: 2016 Shingles/Zostavax: Declines, not indicated due to other chronic conditions  Names of Other Physician/Practitioners you currently use: 1. [facility]- here for primary care 2. Dr. Patrici Ranks, eye doctor, last visit several years ago, has upcoming appt 3. Dr. Johnella Moloney, dentist, last visit march/2018 Patient Care Team: Unk Pinto, MD as PCP - General (Internal Medicine) Inda Castle, MD as Consulting Physician (Gastroenterology) Izora Gala, MD as Consulting Physician (Otolaryngology)  Allergies Allergies  Allergen Reactions  . Atorvastatin Other (See Comments)    Doesn't remember reaction  . Diphenhydramine Hcl (Sleep) Hives  . Hydrocodone-Acetaminophen Other (See Comments)    Reaction not remembered  . Lopid [Gemfibrozil] Other (See Comments)    Doesn't remember reaction  . Loratadine Hives  . Lorazepam Hives  . Simvastatin Other (See Comments)    Reaction not remembered  . Sulfamethoxazole Hives  . Sulfonamide Derivatives Hives    SURGICAL HISTORY She  has a past surgical history that includes Hip Arthroplasty (Bilateral); Shoulder surgery (Right); Back surgery; Cholecystectomy; Neck surgery; Esophagogastroduodenoscopy (07/16/2012); gastric varices banding (07/16/2012); Abdominal hysterectomy; ir generic historical (06/25/2016); ir generic historical (06/27/2016); ir generic historical (07/17/2016); and Cataract extraction, bilateral. FAMILY HISTORY Her family history includes Atrial fibrillation in her sister;  Diabetes in her brother; Heart disease in  her mother and sister. SOCIAL HISTORY She  reports that she quit smoking about 2 years ago. Her smoking use included Cigarettes. She has a 50.00 pack-year smoking history. She has never used smokeless tobacco. She reports that she does not drink alcohol or use drugs.   Objective:   Today's Vitals   05/31/17 1102  BP: 112/72  Pulse: 87  Resp: 14  Temp: (!) 97.5 F (36.4 C)  SpO2: 92%  Weight: 185 lb (83.9 kg)  Height: 5' 2"  (1.575 m)   Body mass index is 33.84 kg/m.  General Appearance: Appears ill, no acute distress Eyes: PERRLA, EOMs, conjunctiva no swelling or erythema Sinuses: No Frontal/maxillary tenderness ENT/Mouth: Ext aud canals clear, TMs without erythema.Tonsils not swollen or erythematous, dry mucosa Neck: Supple, thyroid normal.  Respiratory: 3L O2 via [place], diffuse decreased breath sounds, wheezing bilateral lower lobes, BS equal bilaterally without rales, rhonchi or stridor.  Cardio: RRR with 2/6 systolic murmur. Decreased bilateral pulses with statsis dermatitis bilateral legs with 2-3+ edema, left ankle with erythema, swelling, tenderness no warmth, no pain at the joint.  Abdomen: Soft, + BS, Distended, LUQ/epigastric tenderness, no guarding, rebound, hernias, masses. Lymphatics: + bilateral tender cervical lymphadenopathy.  Musculoskeletal: Full ROM, 3/5 strength, in wheelchair, Decreased ROM left shoulder due to pain.  Breasts: left breast normal without mass, skin or nipple changes or axillary nodes, abnormal mass palpable right axilla/tail of spence. Skin: Warm, dry without rashes, lesions, ecchymosis.  Neuro: Cranial nerves intact. No Asterixis today Psych: Awake and oriented X 3, normal affect, Insight and Judgment appropriate.   Vicie Mutters, PA-C   05/31/2017

## 2017-05-31 NOTE — Addendum Note (Signed)
Addended by: Vicie Mutters R on: 05/31/2017 01:34 PM   Modules accepted: Orders

## 2017-06-01 LAB — PROTIME-INR
INR: 1
Prothrombin Time: 10.7 s (ref 9.0–11.5)

## 2017-06-01 LAB — CBC WITH DIFFERENTIAL/PLATELET
BASOS PCT: 0.7 %
Basophils Absolute: 32 cells/uL (ref 0–200)
EOS PCT: 3.1 %
Eosinophils Absolute: 143 cells/uL (ref 15–500)
HCT: 29 % — ABNORMAL LOW (ref 35.0–45.0)
Hemoglobin: 9.6 g/dL — ABNORMAL LOW (ref 11.7–15.5)
Lymphs Abs: 833 cells/uL — ABNORMAL LOW (ref 850–3900)
MCH: 32.5 pg (ref 27.0–33.0)
MCHC: 33.1 g/dL (ref 32.0–36.0)
MCV: 98.3 fL (ref 80.0–100.0)
MONOS PCT: 6.8 %
MPV: 12.3 fL (ref 7.5–12.5)
Neutro Abs: 3280 cells/uL (ref 1500–7800)
Neutrophils Relative %: 71.3 %
PLATELETS: 81 10*3/uL — AB (ref 140–400)
RBC: 2.95 10*6/uL — AB (ref 3.80–5.10)
RDW: 14.2 % (ref 11.0–15.0)
TOTAL LYMPHOCYTE: 18.1 %
WBC mixed population: 313 cells/uL (ref 200–950)
WBC: 4.6 10*3/uL (ref 3.8–10.8)

## 2017-06-01 LAB — BASIC METABOLIC PANEL WITH GFR
BUN/Creatinine Ratio: 14 (calc) (ref 6–22)
BUN: 22 mg/dL (ref 7–25)
CALCIUM: 8.8 mg/dL (ref 8.6–10.4)
CO2: 35 mmol/L — ABNORMAL HIGH (ref 20–32)
Chloride: 100 mmol/L (ref 98–110)
Creat: 1.54 mg/dL — ABNORMAL HIGH (ref 0.60–0.93)
GFR, EST AFRICAN AMERICAN: 39 mL/min/{1.73_m2} — AB (ref >=60)
GFR, EST NON AFRICAN AMERICAN: 34 mL/min/{1.73_m2} — AB (ref >=60)
Glucose, Bld: 109 mg/dL — ABNORMAL HIGH (ref 65–99)
POTASSIUM: 4.6 mmol/L (ref 3.5–5.3)
Sodium: 142 mmol/L (ref 135–146)

## 2017-06-01 LAB — URIC ACID: Uric Acid, Serum: 5.3 mg/dL (ref 2.5–7.0)

## 2017-06-01 LAB — HEMOGLOBIN A1C
HEMOGLOBIN A1C: 5.9 %{Hb} — AB (ref <5.7)
MEAN PLASMA GLUCOSE: 123 (calc)
eAG (mmol/L): 6.8 (calc)

## 2017-06-01 LAB — MAGNESIUM: MAGNESIUM: 2.5 mg/dL (ref 1.5–2.5)

## 2017-06-01 LAB — TSH: TSH: 1.75 m[IU]/L (ref 0.40–4.50)

## 2017-06-01 LAB — HEPATIC FUNCTION PANEL
AG RATIO: 1.7 (calc) (ref 1.0–2.5)
ALT: 20 U/L (ref 6–29)
AST: 30 U/L (ref 10–35)
Albumin: 3.4 g/dL — ABNORMAL LOW (ref 3.6–5.1)
Alkaline phosphatase (APISO): 126 U/L (ref 33–130)
BILIRUBIN DIRECT: 0.3 mg/dL — AB (ref 0.0–0.2)
BILIRUBIN TOTAL: 1 mg/dL (ref 0.2–1.2)
GLOBULIN: 2 g/dL (ref 1.9–3.7)
Indirect Bilirubin: 0.7 mg/dL (calc) (ref 0.2–1.2)
Total Protein: 5.4 g/dL — ABNORMAL LOW (ref 6.1–8.1)

## 2017-06-01 LAB — VITAMIN D 25 HYDROXY (VIT D DEFICIENCY, FRACTURES): VIT D 25 HYDROXY: 89 ng/mL (ref 30–100)

## 2017-06-01 LAB — LIPID PANEL
CHOL/HDL RATIO: 3.3 (calc) (ref <5.0)
Cholesterol: 177 mg/dL (ref <200)
HDL: 53 mg/dL (ref >50)
LDL CHOLESTEROL (CALC): 101 mg/dL — AB
NON-HDL CHOLESTEROL (CALC): 124 mg/dL (ref <130)
Triglycerides: 130 mg/dL (ref <150)

## 2017-06-01 LAB — IRON, TOTAL/TOTAL IRON BINDING CAP
%SAT: 15 % (ref 11–50)
Iron: 60 ug/dL (ref 45–160)
TIBC: 399 mcg/dL (calc) (ref 250–450)

## 2017-06-01 LAB — VITAMIN B12: Vitamin B-12: 1035 pg/mL (ref 200–1100)

## 2017-06-01 LAB — AMMONIA: Ammonia: 150 umol/L — ABNORMAL HIGH (ref <=72)

## 2017-06-03 NOTE — Progress Notes (Signed)
Pt aware of lab results & voiced understanding of those results.

## 2017-06-04 ENCOUNTER — Encounter: Payer: Self-pay | Admitting: Gastroenterology

## 2017-06-04 ENCOUNTER — Ambulatory Visit (INDEPENDENT_AMBULATORY_CARE_PROVIDER_SITE_OTHER): Payer: PPO | Admitting: Gastroenterology

## 2017-06-04 VITALS — BP 110/60 | HR 86 | Ht 62.0 in | Wt 190.0 lb

## 2017-06-04 DIAGNOSIS — D649 Anemia, unspecified: Secondary | ICD-10-CM | POA: Diagnosis not present

## 2017-06-04 DIAGNOSIS — D696 Thrombocytopenia, unspecified: Secondary | ICD-10-CM | POA: Diagnosis not present

## 2017-06-04 DIAGNOSIS — I10 Essential (primary) hypertension: Secondary | ICD-10-CM | POA: Diagnosis not present

## 2017-06-04 DIAGNOSIS — K7460 Unspecified cirrhosis of liver: Secondary | ICD-10-CM

## 2017-06-04 DIAGNOSIS — K7290 Hepatic failure, unspecified without coma: Secondary | ICD-10-CM | POA: Diagnosis not present

## 2017-06-04 DIAGNOSIS — K7682 Hepatic encephalopathy: Secondary | ICD-10-CM

## 2017-06-04 DIAGNOSIS — I8500 Esophageal varices without bleeding: Secondary | ICD-10-CM | POA: Diagnosis not present

## 2017-06-04 DIAGNOSIS — R188 Other ascites: Secondary | ICD-10-CM | POA: Diagnosis not present

## 2017-06-04 DIAGNOSIS — Z86010 Personal history of colonic polyps: Secondary | ICD-10-CM

## 2017-06-04 LAB — AFP TUMOR MARKER: AFP-Tumor Marker: 5.5 ng/mL

## 2017-06-04 MED ORDER — RIFAXIMIN 550 MG PO TABS: 550.0000 mg | ORAL_TABLET | Freq: Two times a day (BID) | ORAL | 3 refills | Status: DC

## 2017-06-04 NOTE — Progress Notes (Signed)
HPI :  71 y/o female with oxygen dependant COPD, here for a follow up visit for NASH cirrhosis complicated by esophageal varices, hepatic encephalopathy.   Since her last visit she's been doing pretty well in regards to her liver disease. She's been compliant with her lactulose, which causes diarrhea daily, however it's controlling her hepatic encephalopathy. Her husband states she is doing good job in this regard. We have previously attempted to get rifaximin however was too expensive for her. She has some baseline lower extremity edema, she is on an atypical regimen of diuretics with 80 milligrams of Lasix in the morning and 8m q PM. She is dosing Zaroxyln 2.570mevery other day. She previously had electrolyte issues on aldactone. LE edema stable, no worsening of ascites. She's had her flu shot this year.   Modified barium swallow done  01/25/17 -  no aspiration evident but she does have intermittent deep flash penetration of thin liquids, I think this may be related to her current symptoms. Speech path has made recommendations when she swallows to minimize these symptoms. She states she is eating pretty well since her last visit doesn't have any significant dysphagia.   She has had her nonselective beta blocker stopped in regards to treatment for her varices due to her COPD. she is taking bisoprolol for this issue.   Endoscopic history EGD 10/22/13 - grace 2 varices, no stricture Colonoscopy 06/2008 - multiple colon polyps, - adenomas / tubulovillous adenoma   Past Medical History:  Diagnosis Date  . Asthma   . COPD (chronic obstructive pulmonary disease) ([facility]  . Esophageal varices ([facility] [place]  . Fibromyalgia   . Gastritis   . Hepatic cirrhosis ([facility]  . Hepatic encephalopathy ([facility]  . Hyperlipidemia   . Hypertension   . IBS (irritable bowel syndrome)   . Splenomegaly   . Vitamin D deficiency      Past Surgical History:  Procedure Laterality Date  . ABDOMINAL HYSTERECTOMY    .  BACK SURGERY    . CATARACT EXTRACTION, BILATERAL    . CHOLECYSTECTOMY    . ESOPHAGOGASTRODUODENOSCOPY  07/16/2012   Procedure: ESOPHAGOGASTRODUODENOSCOPY (EGD);  Surgeon: RoInda CastleMD;  [facility];  Service: Endoscopy;  Laterality: N/A;  . GASTRIC VARICES BANDING  07/16/2012   Procedure: GASTRIC VARICES BANDING;  Surgeon: RoInda CastleMD;  Location: [facility];  Service: Endoscopy;  Laterality: N/A;  . HIP ARTHROPLASTY Bilateral   . IR GENERIC HISTORICAL  06/25/2016   IR RADIOLOGIST EVAL & MGMT 06/25/2016 [facility]  . IR GENERIC HISTORICAL  06/27/2016   IR KYPHO LUMBAR INC FX REDUCE BONE BX UNI/BIL CANNULATION INC/IMAGING 06/27/2016 SaLuanne BrasMD [facility]  . IR GENERIC HISTORICAL  07/17/2016   IR RADIOLOGIST EVAL & MGMT 07/17/2016 [facility]  . NECK SURGERY    . SHOULDER SURGERY Right    Family History  Problem Relation Age of Onset  . Diabetes Brother        deceased  . Heart disease Sister        A Fib  . Heart disease Mother        CHF  . Atrial fibrillation Sister   . Colon cancer Neg Hx    Social History  Substance Use Topics  . Smoking status: Former Smoker    Packs/day: 1.00    Years: 50.00    Types: Cigarettes    Quit date: 11/05/2014  . Smokeless tobacco: Never Used  Comment: Quit April 2016  . Alcohol use No   Current Outpatient Prescriptions  Medication Sig Dispense Refill  . gabapentin (NEURONTIN) 600 MG tablet Take 600 mg by mouth 3 (three) times daily.    Marland Kitchen albuterol (PROVENTIL HFA;VENTOLIN HFA) 108 (90 Base) MCG/ACT inhaler Inhale 2 puffs into the lungs every 6 (six) hours as needed for wheezing or shortness of breath. 1 Inhaler 2  . albuterol (PROVENTIL) (2.5 MG/3ML) 0.083% nebulizer solution Take 2.5 mg by nebulization every 4 (four) hours as needed for wheezing or shortness of breath.     . allopurinol (ZYLOPRIM) 300 MG tablet take 1/2-1 tablet by mouth daily for gout 90 tablet 1  . antiseptic oral rinse  (BIOTENE) LIQD 15 mLs by Mouth Rinse route as needed for dry mouth. 59 mL 2  . aspirin EC 81 MG tablet Take 81 mg by mouth at bedtime.    . bisoprolol (ZEBETA) 10 MG tablet Take 1 tablet (10 mg total) by mouth daily. 90 tablet 0  . Cholecalciferol (VITAMIN D3) 5000 units CAPS Take 5,000 Units by mouth daily with breakfast.    . citalopram (CELEXA) 40 MG tablet Take 1 tablet (40 mg total) by mouth daily.    . cyclobenzaprine (FLEXERIL) 10 MG tablet Take 1 tablet (10 mg total) by mouth 3 (three) times daily as needed for muscle spasms. 90 tablet 2  . doxycycline (VIBRAMYCIN) 100 MG capsule Take 1 capsule twice daily with food 20 capsule 0  . ferrous sulfate 325 (65 FE) MG tablet Take 325 mg by mouth daily.     . furosemide (LASIX) 80 MG tablet Take 1 tablet (80 mg total) by mouth 2 (two) times daily. Take 80 mg in the morning and 40 mg in the evening 180 tablet 1  . glucose blood test strip Take sugars once daily 50 each 12  . ipratropium (ATROVENT) 0.02 % nebulizer solution use 1 vial in nebulizer EVERY 4 HOURS AS NEEDED FOR WHEEZING OR SHORTNESS OF BREATH 75/15=5 75 mL 3  . lactulose, encephalopathy, (CHRONULAC) 10 GM/15ML SOLN take 64ms TWICE DAILY (1892/60= 2700 mL 1  . Magnesium 250 MG TABS Take 250 mg by mouth at bedtime.     . metolazone (ZAROXOLYN) 5 MG tablet Take 2.5 mg by mouth. Every other day    . nystatin cream (MYCOSTATIN) Apply 1 application topically 2 (two) times daily. 30 g 1  . ondansetron (ZOFRAN) 4 MG tablet Take 1 tablet (4 mg total) by mouth every 6 (six) hours as needed for nausea or vomiting. 30 tablet 1  . OXYGEN Inhale 2 L into the lungs continuous. 3 L with exertion    . pantoprazole (PROTONIX) 40 MG tablet Take 1 tablet (40 mg total) by mouth daily. 90 tablet 3  . potassium chloride (K-DUR,KLOR-CON) 10 MEQ tablet Take 3 tablets (30 mEq total) by mouth 3 (three) times daily. (Patient taking differently: Take 10 mEq by mouth 3 (three) times daily. ) 270 tablet 11  .  terconazole (TERAZOL 7) 0.4 % vaginal cream Place 1 applicator vaginally at bedtime. 45 g 2  . traMADol (ULTRAM) 50 MG tablet Take 50 mg by mouth every 8 hours as needed for pain 90 tablet 1  . triamcinolone cream (KENALOG) 0.1 % Apply 1 application topically 3 (three) times daily as needed for pain 80 g 0   No current facility-administered medications for this visit.    Allergies  Allergen Reactions  . Atorvastatin Other (See Comments)    Doesn't remember  reaction  . Diphenhydramine Hcl (Sleep) Hives  . Hydrocodone-Acetaminophen Other (See Comments)    Reaction not remembered  . Lopid [Gemfibrozil] Other (See Comments)    Doesn't remember reaction  . Loratadine Hives  . Lorazepam Hives  . Simvastatin Other (See Comments)    Reaction not remembered  . Sulfamethoxazole Hives  . Sulfonamide Derivatives Hives     Review of Systems: All systems reviewed and negative except where noted in HPI.   Lab Results  Component Value Date   WBC 4.4 05/31/2017   HGB 10.3 (L) 05/31/2017   HCT 31.8 (L) 05/31/2017   MCV 102.8 (H) 05/31/2017   PLT 80.0 (L) 05/31/2017    Lab Results  Component Value Date   CREATININE 1.38 (H) 05/31/2017   BUN 23 05/31/2017   NA 140 05/31/2017   K 4.5 05/31/2017   CL 98 05/31/2017   CO2 39 (H) 05/31/2017    Lab Results  Component Value Date   ALT 18 05/31/2017   AST 30 05/31/2017   ALKPHOS 118 (H) 05/31/2017   BILITOT 1.0 05/31/2017   Lab Results  Component Value Date   INR 1.0 05/31/2017   INR 1.09 12/07/2016   INR 1.10 12/02/2016    Lab Results  Component Value Date   IRON 60 05/31/2017   TIBC 399 05/31/2017   FERRITIN 101 01/18/2016   Lab Results  Component Value Date   VITAMINB12 1,035 05/31/2017      Physical Exam: BP 110/60   Pulse 86   Ht 5' 2"  (1.575 m)   Wt 190 lb (86.2 kg)   BMI 34.75 kg/m  Constitutional: Pleasant, female in no acute distress. HEENT: Normocephalic and atraumatic. Conjunctivae are normal. No  scleral icterus. Neck supple.  Cardiovascular: Normal rate, regular rhythm.  Pulmonary/chest: Effort normal and breath sounds slightly decreased BS Abdominal: Soft, protuberant, nontender. There are no masses palpable. No hepatomegaly. Extremities: (+) 1 LE edema Lymphadenopathy: No cervical adenopathy noted. Neurological: Alert and oriented. No asterixis Skin: Skin is warm and dry. No rashes noted. Psychiatric: Normal mood and affect. Behavior is normal.   ASSESSMENT AND PLAN: 71 year old female here for reassessment following issues:  Cirrhosis with ascites / esophageal varices / hepatic encephalopathy - she is on a stable regimen outlined above. She has significant comorbidities including oxygen dependent COPD, she is not a good transplant candidate. Overall stable since her last visit without hospitalization.   Recommend the following: - continue lactulose but causing a lot of diarrhea, will try again to obtain Rifaximin through [facility] to see if we can get her a cheaper price - continue present dosing of diuretics, while an atypical regimen, it is working for her and labs stable. Intolerance of aldactone due to electrolyte issues in the past - due for [facility] screening with [place] - on bisoprolol for esophageal varices (due to COPD). They have declined banding / EGD given her respiratory status - low Na diet  History of adenomatous polyps - patient wishes to forgo future colonoscopy surveillance at this time given her comorbidities and risks associated with anesthesia, pneumonias in the past year.   Follow up in 3 months for reassessment.  [place] Cellar, MD [facility] Pager [phone]

## 2017-06-04 NOTE — Patient Instructions (Addendum)
If you are age 71 or older, your body mass index should be between 23-30. Your Body mass index is 34.75 kg/m. If this is out of the aforementioned range listed, please consider follow up with your Primary Care Provider.  If you are age 5 or younger, your body mass index should be between 19-25. Your Body mass index is 34.75 kg/m. If this is out of the aformentioned range listed, please consider follow up with your Primary Care Provider.   You have been scheduled for an abdominal ultrasound at [facility] on 06-11-17 at 11:30am.  Please arrive at 11:10am.  Have nothing to eat or drink after midnight. Please call [phone] if you have any questions or you need to reschedule.  We have sent your demographic information and a prescription for Xifaxan to [facility]. This pharmacy is able to get medication approved through insurance and get you the lowest copay possible. If you have not heard from them within 1 week, please call our office at [phone] to let us know.  Thank you.

## 2017-06-05 ENCOUNTER — Ambulatory Visit: Payer: PPO

## 2017-06-05 ENCOUNTER — Ambulatory Visit
Admission: RE | Admit: 2017-06-05 | Discharge: 2017-06-05 | Disposition: A | Discharge status: Home / Self Care | Payer: PPO | Source: Ambulatory Visit | Attending: Physician Assistant | Admitting: Physician Assistant

## 2017-06-05 DIAGNOSIS — R928 Other abnormal and inconclusive findings on diagnostic imaging of breast: Secondary | ICD-10-CM | POA: Diagnosis not present

## 2017-06-05 DIAGNOSIS — N6310 Unspecified lump in the right breast, unspecified quadrant: Secondary | ICD-10-CM

## 2017-06-05 LAB — URINALYSIS, ROUTINE W REFLEX MICROSCOPIC
BILIRUBIN URINE: NEGATIVE
GLUCOSE, UA: NEGATIVE
HGB URINE DIPSTICK: NEGATIVE
Ketones, ur: NEGATIVE
Leukocytes, UA: NEGATIVE
Nitrite: NEGATIVE
PROTEIN: NEGATIVE
Specific Gravity, Urine: 1.014 (ref 1.001–1.03)
pH: 6 (ref 5.0–8.0)

## 2017-06-05 LAB — MICROALBUMIN / CREATININE URINE RATIO
Creatinine, Urine: 91 mg/dL (ref 20–275)
Microalb Creat Ratio: 4 mcg/mg creat (ref <30)
Microalb, Ur: 0.4 mg/dL

## 2017-06-05 NOTE — Progress Notes (Signed)
Pt's husband was made aware of lab results & voiced understanding of those results.

## 2017-06-07 ENCOUNTER — Encounter: Payer: Self-pay | Admitting: Physician Assistant

## 2017-06-07 ENCOUNTER — Ambulatory Visit (HOSPITAL_COMMUNITY): Payer: PPO

## 2017-06-07 ENCOUNTER — Other Ambulatory Visit: Payer: Self-pay | Admitting: Internal Medicine

## 2017-06-11 ENCOUNTER — Telehealth: Payer: Self-pay | Admitting: Gastroenterology

## 2017-06-11 ENCOUNTER — Ambulatory Visit
Admission: RE | Admit: 2017-06-11 | Discharge: 2017-06-11 | Disposition: A | Discharge status: Home / Self Care | Payer: PPO | Source: Ambulatory Visit | Attending: Gastroenterology | Admitting: Gastroenterology

## 2017-06-11 DIAGNOSIS — K7682 Hepatic encephalopathy: Secondary | ICD-10-CM

## 2017-06-11 DIAGNOSIS — I8500 Esophageal varices without bleeding: Secondary | ICD-10-CM

## 2017-06-11 DIAGNOSIS — K7290 Hepatic failure, unspecified without coma: Secondary | ICD-10-CM

## 2017-06-11 DIAGNOSIS — R188 Other ascites: Principal | ICD-10-CM

## 2017-06-11 DIAGNOSIS — K7460 Unspecified cirrhosis of liver: Secondary | ICD-10-CM | POA: Diagnosis not present

## 2017-06-11 NOTE — Telephone Encounter (Signed)
Per request, faxed office note to Gae Bon at [facility] at [phone].

## 2017-06-12 ENCOUNTER — Other Ambulatory Visit: Payer: Self-pay

## 2017-06-12 ENCOUNTER — Telehealth: Payer: Self-pay | Admitting: Gastroenterology

## 2017-06-12 DIAGNOSIS — R932 Abnormal findings on diagnostic imaging of liver and biliary tract: Secondary | ICD-10-CM

## 2017-06-12 NOTE — Telephone Encounter (Signed)
Spoke to patient and husband, see result note.

## 2017-06-14 NOTE — Telephone Encounter (Signed)
We have received fax from [facility] that Xifaxan 550 mg has been approved from 06/13/17-12/10/17.

## 2017-06-17 ENCOUNTER — Telehealth: Payer: Self-pay | Admitting: Gastroenterology

## 2017-06-17 NOTE — Telephone Encounter (Signed)
[facility] states patient medication was approved but has a high copay. Pharmacy will reach out to patient to see if they can help.

## 2017-06-18 ENCOUNTER — Other Ambulatory Visit: Payer: Self-pay | Admitting: Internal Medicine

## 2017-06-18 NOTE — Telephone Encounter (Signed)
Called and spoke to [facility].  The pt's copay is $750.  [facility] called the pt today to see if she would like to apply for pt assistance. They will let us know what she decides.

## 2017-06-19 DIAGNOSIS — J449 Chronic obstructive pulmonary disease, unspecified: Secondary | ICD-10-CM | POA: Diagnosis not present

## 2017-06-24 ENCOUNTER — Other Ambulatory Visit: Payer: Self-pay | Admitting: Internal Medicine

## 2017-06-24 DIAGNOSIS — G629 Polyneuropathy, unspecified: Secondary | ICD-10-CM

## 2017-06-24 NOTE — Telephone Encounter (Signed)
Called and spoke to [facility].  They indicated that they spoke to the pt on 06-20-17 and she said she can't afford the medication and apparently does not want to pursue Patient Assistance. She told [facility] that another "Dr. gave her a different antibiotic".  The only thing I see in her chart is that she was given doxycycline on 05-31-17 (and Gabapentin on 06-24-17) by Vicie Mutters, PA-C.

## 2017-06-25 ENCOUNTER — Ambulatory Visit
Admission: RE | Admit: 2017-06-25 | Discharge: 2017-06-25 | Disposition: A | Discharge status: Home / Self Care | Payer: PPO | Source: Ambulatory Visit | Attending: Gastroenterology | Admitting: Gastroenterology

## 2017-06-25 DIAGNOSIS — R590 Localized enlarged lymph nodes: Secondary | ICD-10-CM | POA: Diagnosis not present

## 2017-06-25 DIAGNOSIS — R932 Abnormal findings on diagnostic imaging of liver and biliary tract: Secondary | ICD-10-CM

## 2017-06-25 MED ORDER — GADOBENATE DIMEGLUMINE 529 MG/ML IV SOLN
12.0000 mL | Freq: Once | INTRAVENOUS | Status: AC | PRN
  Administered 2017-06-25: 12 mL via INTRAVENOUS

## 2017-06-26 ENCOUNTER — Other Ambulatory Visit: Payer: Self-pay

## 2017-06-26 DIAGNOSIS — C220 Liver cell carcinoma: Secondary | ICD-10-CM

## 2017-06-26 DIAGNOSIS — J449 Chronic obstructive pulmonary disease, unspecified: Secondary | ICD-10-CM | POA: Diagnosis not present

## 2017-06-26 NOTE — Progress Notes (Signed)
  Oncology Nurse Navigator Documentation  Navigator Location: [facility] (06/26/17 1337) Referral date to RadOnc/MedOnc: 06/26/17 (06/26/17 1337) )Navigator Encounter Type: Introductory phone call (06/26/17 1337)   Abnormal Finding Date: 06/11/17 (06/26/17 1337) Confirmed Diagnosis Date: 06/25/17 (06/26/17 1337)                 Treatment Phase: Abnormal Scans (06/26/17 1337)     Interventions: Psycho-social support;Education (06/26/17 1337)  I called patient per Dr. Doyne Keel request to introduce myself and my role as GI Navigator. Patient was resting but I was able to talk to patient's husband to explain that Mrs. Wyricks' case would be presented at tumor board on 07/03/17. Mr. Napoleon verbalized understanding that I would be working with his wife and treatment team and that our scheduling department would be calling to schedule a med/onc appointment. I will plan to meet patient at her initial med/onc appointment.   Education Method: Verbal (06/26/17 1337)      Acuity: Level 1 (06/26/17 1337) Acuity Level 1: Initial guidance, education and coordination as needed (06/26/17 1337)       Time Spent with Patient: 15 (06/26/17 1337)

## 2017-07-01 ENCOUNTER — Encounter: Payer: Self-pay | Admitting: Hematology

## 2017-07-01 ENCOUNTER — Telehealth: Payer: Self-pay | Admitting: Hematology

## 2017-07-01 NOTE — Telephone Encounter (Signed)
Appt has been scheduled for the pt to see Dr. Burr Medico on 12/5 at [place] has been given to the pt's husband who's aware to arrive 30 minutes early. Letter mailed.

## 2017-07-02 ENCOUNTER — Ambulatory Visit (HOSPITAL_COMMUNITY)
Admission: RE | Admit: 2017-07-02 | Discharge: 2017-07-02 | Disposition: A | Discharge status: Home / Self Care | Payer: PPO | Source: Ambulatory Visit | Attending: Gastroenterology | Admitting: Gastroenterology

## 2017-07-02 ENCOUNTER — Other Ambulatory Visit: Payer: Self-pay | Admitting: *Deleted

## 2017-07-02 ENCOUNTER — Encounter (HOSPITAL_COMMUNITY): Payer: Self-pay

## 2017-07-02 DIAGNOSIS — R918 Other nonspecific abnormal finding of lung field: Secondary | ICD-10-CM | POA: Diagnosis not present

## 2017-07-02 DIAGNOSIS — I700 Atherosclerosis of aorta: Secondary | ICD-10-CM | POA: Diagnosis not present

## 2017-07-02 DIAGNOSIS — J90 Pleural effusion, not elsewhere classified: Secondary | ICD-10-CM | POA: Insufficient documentation

## 2017-07-02 DIAGNOSIS — C220 Liver cell carcinoma: Secondary | ICD-10-CM | POA: Diagnosis not present

## 2017-07-02 DIAGNOSIS — J9811 Atelectasis: Secondary | ICD-10-CM | POA: Diagnosis not present

## 2017-07-02 DIAGNOSIS — K7460 Unspecified cirrhosis of liver: Secondary | ICD-10-CM | POA: Insufficient documentation

## 2017-07-02 DIAGNOSIS — L820 Inflamed seborrheic keratosis: Secondary | ICD-10-CM | POA: Diagnosis not present

## 2017-07-02 DIAGNOSIS — I2510 Atherosclerotic heart disease of native coronary artery without angina pectoris: Secondary | ICD-10-CM | POA: Insufficient documentation

## 2017-07-02 MED ORDER — IOPAMIDOL (ISOVUE-300) INJECTION 61%
INTRAVENOUS | Status: AC
  Filled 2017-07-02: qty 75

## 2017-07-02 MED ORDER — IOPAMIDOL (ISOVUE-300) INJECTION 61%
75.0000 mL | Freq: Once | INTRAVENOUS | Status: AC | PRN
  Administered 2017-07-02: 60 mL via INTRAVENOUS

## 2017-07-03 ENCOUNTER — Other Ambulatory Visit: Payer: Self-pay | Admitting: Internal Medicine

## 2017-07-03 DIAGNOSIS — K7290 Hepatic failure, unspecified without coma: Secondary | ICD-10-CM

## 2017-07-03 DIAGNOSIS — K7682 Hepatic encephalopathy: Secondary | ICD-10-CM

## 2017-07-03 MED ORDER — RIFAMPIN 300 MG PO CAPS: ORAL_CAPSULE | ORAL | 1 refills | Status: DC

## 2017-07-05 DIAGNOSIS — M7061 Trochanteric bursitis, right hip: Secondary | ICD-10-CM | POA: Diagnosis not present

## 2017-07-05 DIAGNOSIS — M7062 Trochanteric bursitis, left hip: Secondary | ICD-10-CM | POA: Diagnosis not present

## 2017-07-08 NOTE — Progress Notes (Signed)
[place]  [phone] Fax:([phone] Star Note   Patient Care Team: Unk Pinto, MD as PCP - General (Internal Medicine) Inda Castle, MD (Inactive) as Consulting Physician (Gastroenterology) Izora Gala, MD as Consulting Physician (Otolaryngology) 07/10/2017  REFERRAL PHYSICIAN: Dr. Havery Moros   CHIEF COMPLAINTS/PURPOSE OF CONSULTATION:  Liver lesions, suspicious for [facility]   HISTORY OF PRESENTING ILLNESS: 07/10/17  [name] 71 y.o. female is here because of liver lesions suspicious for [facility]. She was referred by GI Dr. Havery Moros. She presents to the clinic today accompanied by her husband.    She was diagnosed with liver cirrhosis for 3-4 years. 2 years ago she had low potassium and elevated ammonia levels, she was admitted and treated for hepatic encephalopathy.  She was put on lactulose to clear the ammonia andThe lactulose caused diarrhea so she now takes rifampin. She is not sure what the cause of cirrhosis is. Dr. Havery Moros believes it is related to her fatty liver.  On a routine surveillance abdominal ultrasound on June 11, 2017, she was found to have a new left lobe liver lesion, suspicious for hepatocellular carcinoma.  He underwent MRI of liver with and without contrast on June 25, 2017, which showed 2 early arterial phase enhancing liver lesions consistent with hepatocellular carcinoma, 2.4 x 2.3 cm lesion in segment 6 and a 1.8 x 1.5 cm lesion in segment 2.  She was referred to [place] for further evaluation.  She has borderline DM. She denies any heart issues. She has had several surgeries in the past. Her father passed from lung cancer due to smoking. Her material uncle possibly had colon cancer. Her paternal cousin had liver cancer. another cousin had metastatic lung cancer. She stopped smoking 3 years ago before smoking for several years. She rarely drinks. Her legs become swelling occasionally and takes lasix  to help. Fluid will build up in her system but has never required drainage. Her last upper endoscopy was in 2015 and her last colonoscopy was in 2009. Her 05/2017 mammogram was normal.   Today she notes she had episodes of confusion in the past which brought about work up. Results showed elevated ammonia levels caused by liver cirrhosis. Her PCP is Dr. Melford Aase. She notes a recurrence nodule on her right side of neck. It was previously removed and it was not cancerous. She would like to be around in the summer for when her great grandchild will be born. When she sits up real fast she will get dizzy. She was previously treated for vertigo but her medication was stopped. She is able to lay flat.  She was diagnosed with COPD for 3 years and has been on oxygen for 3 years. She is able to ambulate with walker for short distance due to her ongoing back pain. She can do ADL with some assistance.   MEDICAL HISTORY:  Past Medical History:  Diagnosis Date  . Asthma   . COPD (chronic obstructive pulmonary disease) ([place])   . Diabetes mellitus without complication ([place])   . Esophageal varices ([place])   . Fibromyalgia   . Gastritis   . Hepatic cirrhosis ([place])   . Hepatic encephalopathy ([place])   . Hyperlipidemia   . Hypertension   . IBS (irritable bowel syndrome)   . Splenomegaly   . Vitamin D deficiency     SURGICAL HISTORY: Past Surgical History:  Procedure Laterality Date  . ABDOMINAL HYSTERECTOMY    . BACK SURGERY    . CATARACT  EXTRACTION, BILATERAL    . CHOLECYSTECTOMY    . ESOPHAGOGASTRODUODENOSCOPY  07/16/2012   Procedure: ESOPHAGOGASTRODUODENOSCOPY (EGD);  Surgeon: Inda Castle, MD;  Location: [facility];  Service: Endoscopy;  Laterality: N/A;  . GASTRIC VARICES BANDING  07/16/2012   Procedure: GASTRIC VARICES BANDING;  Surgeon: Inda Castle, MD;  Location: [facility] ENDOSCOPY;  Service: Endoscopy;  Laterality: N/A;  . HIP ARTHROPLASTY Bilateral   . IR GENERIC HISTORICAL  06/25/2016   IR  RADIOLOGIST EVAL & MGMT 06/25/2016 [facility]  . IR GENERIC HISTORICAL  06/27/2016   IR KYPHO LUMBAR INC FX REDUCE BONE BX UNI/BIL CANNULATION INC/IMAGING 06/27/2016 Luanne Bras, MD [facility]  . IR GENERIC HISTORICAL  07/17/2016   IR RADIOLOGIST EVAL & MGMT 07/17/2016 [facility]  . NECK SURGERY    . SHOULDER SURGERY Right     SOCIAL HISTORY: Social History   Socioeconomic History  . Marital status: Married    Spouse name: [name]  . Number of children: 5  . Years of education: Not on file  . Highest education level: Not on file  Social Needs  . Financial resource strain: Not on file  . Food insecurity - worry: Not on file  . Food insecurity - inability: Not on file  . Transportation needs - medical: Not on file  . Transportation needs - non-medical: Not on file  Occupational History  . Occupation: Arboriculturist: UNEMPLOYED  Tobacco Use  . Smoking status: Former Smoker    Packs/day: 1.00    Years: 50.00    Pack years: 50.00    Types: Cigarettes    Last attempt to quit: 11/05/2014    Years since quitting: 2.6  . Smokeless tobacco: Never Used  . Tobacco comment: Quit April 2016  Substance and Sexual Activity  . Alcohol use: No    Alcohol/week: 0.0 oz  . Drug use: No  . Sexual activity: Not on file  Other Topics Concern  . Not on file  Social History Narrative  . Not on file    FAMILY HISTORY: Family History  Problem Relation Age of Onset  . Diabetes Brother        deceased  . Heart disease Sister        A Fib  . Heart disease Mother        CHF  . Atrial fibrillation Sister   . Cancer Father        lung cancer   . Cancer Maternal Uncle        unknown type cancer   . Cancer Other        lung cancer   . Colon cancer Neg Hx     ALLERGIES:  is allergic to atorvastatin; diphenhydramine hcl (sleep); hydrocodone-acetaminophen; lopid [gemfibrozil]; loratadine; lorazepam; simvastatin; sulfamethoxazole; and sulfonamide  derivatives.  MEDICATIONS:  Current Outpatient Medications  Medication Sig Dispense Refill  . albuterol (PROVENTIL HFA;VENTOLIN HFA) 108 (90 Base) MCG/ACT inhaler Inhale 2 puffs into the lungs every 6 (six) hours as needed for wheezing or shortness of breath. 1 Inhaler 2  . albuterol (PROVENTIL) (2.5 MG/3ML) 0.083% nebulizer solution Take 2.5 mg by nebulization every 4 (four) hours as needed for wheezing or shortness of breath.     . allopurinol (ZYLOPRIM) 300 MG tablet take 1/2-1 tablet by mouth daily for gout 90 tablet 1  . antiseptic oral rinse (BIOTENE) LIQD 15 mLs by Mouth Rinse route as needed for dry mouth. 59 mL 2  . aspirin EC 81 MG tablet  Take 81 mg by mouth at bedtime.    . bisoprolol (ZEBETA) 10 MG tablet Take 1 tablet (10 mg total) by mouth daily. 90 tablet 0  . Cholecalciferol (VITAMIN D3) 5000 units CAPS Take 5,000 Units by mouth daily with breakfast.    . citalopram (CELEXA) 40 MG tablet Take 1 tablet (40 mg total) by mouth 2 (two) times daily. 60 tablet 3  . cyclobenzaprine (FLEXERIL) 10 MG tablet Take 1 tablet (10 mg total) by mouth 3 (three) times daily as needed for muscle spasms. 90 tablet 2  . doxycycline (VIBRAMYCIN) 100 MG capsule Take 1 capsule twice daily with food 20 capsule 0  . ferrous sulfate 325 (65 FE) MG tablet Take 325 mg by mouth daily.     . furosemide (LASIX) 80 MG tablet Take 1 tablet (80 mg total) by mouth 2 (two) times daily. Take 80 mg in the morning and 40 mg in the evening 180 tablet 1  . gabapentin (NEURONTIN) 600 MG tablet Take 1/2 to 1 tablet 3 times a day for neuropathy pain in legs 270 tablet 1  . glucose blood test strip Take sugars once daily 50 each 12  . ipratropium (ATROVENT) 0.02 % nebulizer solution use 1 vial in nebulizer EVERY 4 HOURS AS NEEDED FOR WHEEZING OR SHORTNESS OF BREATH 75/15=5 75 mL 3  . lactulose, encephalopathy, (CHRONULAC) 10 GM/15ML SOLN take 26ms TWICE DAILY (1892/60= 2700 mL 1  . Magnesium 250 MG TABS Take 250 mg by mouth  at bedtime.     . metolazone (ZAROXOLYN) 5 MG tablet Take 2.5 mg by mouth. Every other day    . nystatin cream (MYCOSTATIN) Apply 1 application topically 2 (two) times daily. 30 g 1  . ondansetron (ZOFRAN) 4 MG tablet Take 1 tablet (4 mg total) by mouth every 6 (six) hours as needed for nausea or vomiting. 30 tablet 1  . OXYGEN Inhale 2 L into the lungs continuous. 3 L with exertion    . pantoprazole (PROTONIX) 40 MG tablet Take 1 tablet (40 mg total) by mouth daily. 90 tablet 3  . potassium chloride (K-DUR,KLOR-CON) 10 MEQ tablet Take 3 tablets (30 mEq total) by mouth 3 (three) times daily. (Patient taking differently: Take 10 mEq by mouth 3 (three) times daily. ) 270 tablet 11  . rifampin (RIFADIN) 300 MG capsule Take 1 capsule 2 x / day for Liver 180 capsule 1  . terconazole (TERAZOL 7) 0.4 % vaginal cream Place 1 applicator vaginally at bedtime. 45 g 2  . traMADol (ULTRAM) 50 MG tablet Take 50 mg by mouth every 8 hours as needed for pain 90 tablet 1  . triamcinolone cream (KENALOG) 0.1 % Apply 1 application topically 3 (three) times daily as needed for pain 80 g 0   No current facility-administered medications for this visit.     REVIEW OF SYSTEMS:   Constitutional: Denies fevers, chills or abnormal night sweats Eyes: Denies blurriness of vision, double vision or watery eyes Ears, nose, mouth, throat, and face: Denies mucositis or sore throat (+) recurrent nodule on right side of neck Respiratory: Denies cough (+) on oxygen sue to COPD Cardiovascular: Denies palpitation, chest discomfort or lower extremity swelling Gastrointestinal:  Denies nausea, heartburn or change in bowel habits (+) right abdominal pain  Skin: Denies abnormal skin rashes  MSK: (+) Chronic back pain  Lymphatics: Denies new lymphadenopathy or easy bruising Neurological:Denies numbness, tingling or new weaknesses (+) dizziness from vertigo  Behavioral/Psych: Mood is stable, no new changes  All other systems were  reviewed with the patient and are negative.  PHYSICAL EXAMINATION: ECOG PERFORMANCE STATUS: 3 - Symptomatic, >50% confined to bed  Vitals:   07/10/17 1112  BP: (!) 128/59  Pulse: (!) 57  Resp: 18  Temp: 97.9 F (36.6 C)  SpO2: 94%   Filed Weights   07/10/17 1112  Weight: 191 lb 1.6 oz (86.7 kg)    GENERAL:alert, no distress and comfortable SKIN: skin color, texture, turgor are normal, no rashes or significant lesions EYES: normal, conjunctiva are pink and non-injected, sclera clear OROPHARYNX:no exudate, no erythema and lips, buccal mucosa, and tongue normal  NECK: supple, thyroid normal size, non-tender, without nodularity LYMPH:  no palpable lymphadenopathy in the cervical, axillary or inguinal LUNGS: clear to auscultation and percussion with normal breathing effort HEART: regular rate & rhythm and no murmurs and no lower extremity edema ABDOMEN:abdomen soft and normal bowel sounds, very active bowel sounds  Musculoskeletal:no cyanosis of digits and no clubbing  PSYCH: alert & oriented x 3 with fluent speech NEURO: no focal motor/sensory deficits  LABORATORY DATA:  I have reviewed the data as listed CBC Latest Ref Rng & Units 07/10/2017 05/31/2017 05/31/2017  WBC 3.9 - 10.3 10e3/uL 4.5 4.4 4.6  Hemoglobin 11.6 - 15.9 g/dL 10.4(L) 10.3(L) 9.6(L)  Hematocrit 34.8 - 46.6 % 32.5(L) 31.8(L) 29.0(L)  Platelets 145 - 400 10e3/uL 75(L) 80.0(L) 81(L)    CMP Latest Ref Rng & Units 07/10/2017 05/31/2017 05/31/2017  Glucose 70 - 140 mg/dl 92 103(H) 109(H)  BUN 7.0 - 26.0 mg/dL 13.2 23 22   Creatinine 0.6 - 1.1 mg/dL 1.0 1.38(H) 1.54(H)  Sodium 136 - 145 mEq/L 138 140 142  Potassium 3.5 - 5.1 mEq/L 4.1 4.5 4.6  Chloride 96 - 112 mEq/L - 98 100  CO2 22 - 29 mEq/L 35(H) 39(H) 35(H)  Calcium 8.4 - 10.4 mg/dL 9.0 9.1 8.8  Total Protein 6.4 - 8.3 g/dL 5.9(L) 5.7(L) 5.4(L)  Total Bilirubin 0.20 - 1.20 mg/dL 0.99 1.0 1.0  Alkaline Phos 40 - 150 U/L 117 118(H) -  AST 5 - 34 U/L 32 30  30  ALT 0 - 55 U/L 18 18 20      RADIOGRAPHIC STUDIES: I have personally reviewed the radiological images as listed and agreed with the findings in the report. Ct Chest W Contrast  Result Date: 07/03/2017 CLINICAL DATA:  Hepatocellular carcinoma.  Staging. EXAM: CT CHEST WITH CONTRAST TECHNIQUE: Multidetector CT imaging of the chest was performed during intravenous contrast administration. CONTRAST:  61m ISOVUE-300 IOPAMIDOL (ISOVUE-300) INJECTION 61% COMPARISON:  None. FINDINGS: Cardiovascular: Normal heart size. No pericardial effusion identified. Aortic atherosclerosis. Calcification within the LAD coronary artery identified. Mediastinum/Nodes: The trachea appears patent and midline. Normal appearance of the esophagus. No enlarged supraclavicular or axillary adenopathy. No mediastinal or hilar adenopathy. Lungs/Pleura: Small pleural effusions identified bilaterally. Subsegmental atelectasis identified within the lingula. Calcified granuloma in the right base. Masslike architectural distortion within the posterior right upper lobe identified. Tiny nodule in the left base measures 2 mm, image 91 of series 5. Within the superior segment of the right lower lobe there is a 2 mm nodule, image 62 of series 5. Upper Abdomen: The liver is cirrhotic. Stigmata of portal venous hypertension is identified including ascites, varices and splenomegaly. Arterial phase enhancing lesions within both lobes of liver compatible with hepatocellular carcinoma are better seen on recent MRI. Musculoskeletal: Previous right shoulder arthroplasty. Chronic right posterior rib fracture deformities identified. IMPRESSION: 1. No specific findings identified to suggest metastatic  disease to the chest. 2. There are a few tiny less than 5 mm pulmonary nodules within both lungs. Nonspecific. 3. Prior granulomatous disease 4. Small pleural effusions 5. Aortic Atherosclerosis (ICD10-I70.0). LAD coronary artery calcifications. 6. Cirrhosis  with stigmata of portal venous hypertension. Electronically Signed   By: Kerby Moors M.D.   On: 07/03/2017 09:19   Mr Liver W GD Contrast  Result Date: 06/25/2017 CLINICAL DATA:  Followup abnormal abdominal ultrasound examination. EXAM: MRI ABDOMEN WITHOUT AND WITH CONTRAST TECHNIQUE: Multiplanar multisequence MR imaging of the abdomen was performed both before and after the administration of intravenous contrast. CONTRAST:  48m MULTIHANCE GADOBENATE DIMEGLUMINE 529 MG/ML IV SOLN COMPARISON:  CT scan 07/11/2016 hand recent ultrasound 06/11/2017 FINDINGS: Lower chest: No worrisome lesions are identified at the lung bases. No pleural or pericardial effusion. Hepatobiliary: Advanced cirrhotic changes involving the liver with portal venous hypertension, portal venous collaterals, esophageal varices, small volume ascites and splenomegaly. The gallbladder surgically absent. Normal caliber and course of the common bile duct. There are 2 early arterial phase enhancing liver lesions consistent with hepatocellular carcinomas. 18.5 x 15.5 lesion in segment 2 and 24 x 23 mm lesion and segment 6. Both lesions demonstrate rapid washout. Stable small right hepatic lobe cyst. Pancreas: No mass, inflammation or ductal dilatation. Moderate-sized duodenum diverticulum noted near the pancreatic head. Spleen:  Splenomegaly.  No focal lesions. Adrenals/Urinary Tract:  The adrenal glands are unremarkable. There are 2 left-sided renal cyst. A simple cyst in the interpolar region anteriorly and a complex hemorrhagic cyst in the lower pole region posteriorly. Stomach/Bowel: The stomach, duodenum, visualized small bowel and visualize colon are unremarkable. Vascular/Lymphatic: The aorta is normal in caliber. The branch vessels are patent. The major venous structures are patent. The portal and splenic veins are patent. Stable borderline periportal and celiac axis lymph nodes typically seen with cirrhosis. No overt adenopathy. Other:   No abdominal wall hernia or subcutaneous lesions. Musculoskeletal: No significant bony findings. IMPRESSION: 1. Two early arterial phase enhancing liver lesions consistent with hepatocellular carcinomas. 2. Advanced cirrhotic changes involving the liver as described above. There is portal venous hypertension, portal venous collaterals, esophageal varices, splenomegaly and ascites. 3. Stable appearing gastrohepatic ligament, periportal and celiac axis lymph nodes. Electronically Signed   By: PMarijo SanesM.D.   On: 06/25/2017 15:12   [place]dom[place] Limited Ruq  Result Date: 06/11/2017 CLINICAL DATA:  Status post cholecystectomy. Cirrhosis. Question ascites. EXAM: ULTRASOUND ABDOMEN LIMITED RIGHT UPPER QUADRANT COMPARISON:  12/01/2016 limited ultrasound.  07/11/2016 CT. FINDINGS: Gallbladder: Surgically absent. Common bile duct: Diameter: Normal, 5 mm. Liver: Heterogeneous liver with an irregular capsule, consistent with cirrhosis. A new left liver lobe mildly heterogeneous hyperechoic lesion measures 2.1 cm, including on image 18. Portal vein is patent on color Doppler imaging with normal direction of blood flow towards the liver. No evidence of ascites. IMPRESSION: 1. No evidence of ascites. 2. Cirrhosis with a new left liver lobe hyperechoic lesion. This is suspicious for hepatocellular carcinoma. Recommend further evaluation with dedicated pre and post contrast abdominal MRI. These results will be called to the ordering clinician or representative by the Radiologist Assistant, and communication documented in the PACS or zVision Dashboard. Electronically Signed   By: KAbigail MiyamotoM.D.   On: 06/11/2017 17:09    CT Chest W Contrast 07/03/17  IMPRESSION: 1. No specific findings identified to suggest metastatic disease to the chest. 2. There are a few tiny less than 5 mm pulmonary nodules within both lungs. Nonspecific. 3. Prior  granulomatous disease 4. Small pleural effusions 5. Aortic Atherosclerosis  (ICD10-I70.0). LAD coronary artery calcifications. 6. Cirrhosis with stigmata of portal venous hypertension.    MRI Liver W WO Contrast 06/25/17  IMPRESSION: 1. Two early arterial phase enhancing liver lesions consistent with hepatocellular carcinomas, 18.5 x 15.5 lesion in segment 2 and 24 x 23 mm lesion and segment 6 2. Advanced cirrhotic changes involving the liver as described above. There is portal venous hypertension, portal venous collaterals, esophageal varices, splenomegaly and ascites. 3. Stable appearing gastrohepatic ligament, periportal and celiac axis lymph nodes   US Abdomen Limited RUQ 06/11/17  IMPRESSION: 1. No evidence of ascites. 2. Cirrhosis with a new left liver lobe hyperechoic lesion. This is suspicious for hepatocellular carcinoma. Recommend further evaluation with dedicated pre and post contrast abdominal MRI. These results will be called to the ordering clinician or representative by the Radiologist Assistant, and communication documented in the PACS or zVision Dashboard   Diagnostic Mammogram Bilateral 06/05/17  IMPRESSION: Negative exam.  No evidence of malignancy. RECOMMENDATION: Screening mammogram in one year.   ASSESSMENT & PLAN:  [name] is a 71 y.o. caucasian female with a history of COPD (oxygen dependent), HTN, fibromyalgia, CKD, liver cirrhosis secondary to NASH, HLD, IBS, presented with screening discovered two liver masses.   1. Liver lesions (2) suspicious for HCC.  -I reviewed and discussed her image findings.  She has long-standing history of liver cirrhosis, at high risk for hepatocellular carcinoma.  Her recent ultrasound and MRI showed 2 liver lesions with arterial enhancement, and venous washout, typical for hepatocellular carcinoma.  The largest lesion is 2.4 cm.  -However her AFP was normal.we discussed this is likely hepatocellular carcinoma, also metastatic liver lesion cannot be ruled out completely.   -Case was  discussed in our GI tumor board a few weeks ago, the consensus was ultrasound-guided liver biopsy to confirm a tissue diagnosis.   -If biopsy confirmed as liver cancer, she is not eligible for surgery or transplant given her comorbidities, especially oxygen dependent COPD.  Case was discussed in tumor board, Dr. Barry Dienes concur she is not a candidate for surgery.  -staging Imagings are negative for distant metastasis -We discussed other treatment options.  I recommend her to see IR to discuss liver ablation. I explained this procedure to her in detail. If her tumor is too large for ablation I suggest her other options would be chemo-embolization or radiation-embolization.  She agrees with referral, which I made today. -We discussed the surveillance after liver targeted therapy, she would likely follow up with IR closely, and I plan to see her every 6-12 months for follow-up. -If biopsy confirmed as cancer but not liver primary, I suggest further work up to locate primary tumor. Treatment would not be curative at that point. The goal of care would be palliative.  -I encouraged her to contact us with any questions.    2. Liver Cirrhosis, Secondary to NASH, with portal hypertension, esophageal varices, hepatic encephalopathy, child Pugh score 6 (class A) -f/u with Dr. Enis Gash    3. COPD, on oxygen, DM, stage III CKD  -Follow-up with PCP  4. Anemia and thrombocytopenia -Tentative liver cirrhosis and splenomegaly -Monitoring   PLAN:  - IR liver biopsy and IR consult ASAP  - Lab and f/u in 3 months, sooner if biopsy showed metastatic malignancy - Lab today     Orders Placed This Encounter  Procedures  . CBC with Differential    Standing Status:   Standing  Number of Occurrences:   30    Standing Expiration Date:   07/10/2022  . Comprehensive metabolic panel    Standing Status:   Standing    Number of Occurrences:   30    Standing Expiration Date:   07/10/2022  . AFP tumor marker     Standing Status:   Standing    Number of Occurrences:   30    Standing Expiration Date:   07/10/2022  . Protime-INR    Standing Status:   Standing    Number of Occurrences:   30    Standing Expiration Date:   07/10/2022    All questions were answered. The patient knows to call the clinic with any problems, questions or concerns. I spent 55 minutes counseling the patient face to face. The total time spent in the appointment was 60 minutes and more than 50% was on counseling.     Truitt Merle, MD 07/10/2017    This document serves as a record of services personally performed by Truitt Merle, MD. It was created on her behalf by Joslyn Devon, a trained medical scribe. The creation of this record is based on the scribe's personal observations and the provider's statements to them.    I have reviewed the above documentation for accuracy and completeness, and I agree with the above.

## 2017-07-10 ENCOUNTER — Ambulatory Visit (HOSPITAL_BASED_OUTPATIENT_CLINIC_OR_DEPARTMENT_OTHER): Payer: PPO | Admitting: Hematology

## 2017-07-10 ENCOUNTER — Ambulatory Visit (HOSPITAL_BASED_OUTPATIENT_CLINIC_OR_DEPARTMENT_OTHER): Payer: PPO

## 2017-07-10 ENCOUNTER — Encounter: Payer: Self-pay | Admitting: Hematology

## 2017-07-10 ENCOUNTER — Ambulatory Visit
Admission: RE | Admit: 2017-07-10 | Discharge: 2017-07-10 | Disposition: A | Discharge status: Home / Self Care | Payer: PPO | Source: Ambulatory Visit | Attending: Hematology | Admitting: Hematology

## 2017-07-10 DIAGNOSIS — R160 Hepatomegaly, not elsewhere classified: Secondary | ICD-10-CM

## 2017-07-10 DIAGNOSIS — K7581 Nonalcoholic steatohepatitis (NASH): Secondary | ICD-10-CM | POA: Diagnosis not present

## 2017-07-10 DIAGNOSIS — D696 Thrombocytopenia, unspecified: Secondary | ICD-10-CM | POA: Diagnosis not present

## 2017-07-10 DIAGNOSIS — D649 Anemia, unspecified: Secondary | ICD-10-CM | POA: Diagnosis not present

## 2017-07-10 DIAGNOSIS — K7460 Unspecified cirrhosis of liver: Secondary | ICD-10-CM | POA: Diagnosis not present

## 2017-07-10 DIAGNOSIS — C220 Liver cell carcinoma: Secondary | ICD-10-CM | POA: Diagnosis not present

## 2017-07-10 HISTORY — PX: IR RADIOLOGIST EVAL & MGMT: IMG5224

## 2017-07-10 LAB — CBC WITH DIFFERENTIAL/PLATELET
BASO%: 1.2 % (ref 0.0–2.0)
Basophils Absolute: 0.1 10*3/uL (ref 0.0–0.1)
EOS%: 2.6 % (ref 0.0–7.0)
Eosinophils Absolute: 0.1 10*3/uL (ref 0.0–0.5)
HCT: 32.5 % — ABNORMAL LOW (ref 34.8–46.6)
HGB: 10.4 g/dL — ABNORMAL LOW (ref 11.6–15.9)
LYMPH%: 15.2 % (ref 14.0–49.7)
MCH: 32.6 pg (ref 25.1–34.0)
MCHC: 31.9 g/dL (ref 31.5–36.0)
MCV: 102.2 fL — ABNORMAL HIGH (ref 79.5–101.0)
MONO#: 0.3 10*3/uL (ref 0.1–0.9)
MONO%: 7.1 % (ref 0.0–14.0)
NEUT#: 3.4 10*3/uL (ref 1.5–6.5)
NEUT%: 73.9 % (ref 38.4–76.8)
PLATELETS: 75 10*3/uL — AB (ref 145–400)
RBC: 3.18 10*6/uL — AB (ref 3.70–5.45)
RDW: 16.1 % — ABNORMAL HIGH (ref 11.2–14.5)
WBC: 4.5 10*3/uL (ref 3.9–10.3)
lymph#: 0.7 10*3/uL — ABNORMAL LOW (ref 0.9–3.3)

## 2017-07-10 LAB — COMPREHENSIVE METABOLIC PANEL
ALBUMIN: 3.1 g/dL — AB (ref 3.5–5.0)
ALK PHOS: 117 U/L (ref 40–150)
ALT: 18 U/L (ref 0–55)
ANION GAP: 8 meq/L (ref 3–11)
AST: 32 U/L (ref 5–34)
BUN: 13.2 mg/dL (ref 7.0–26.0)
CALCIUM: 9 mg/dL (ref 8.4–10.4)
CO2: 35 mEq/L — ABNORMAL HIGH (ref 22–29)
CREATININE: 1 mg/dL (ref 0.6–1.1)
Chloride: 95 mEq/L — ABNORMAL LOW (ref 98–109)
EGFR: 55 mL/min/{1.73_m2} — ABNORMAL LOW (ref >60)
Glucose: 92 mg/dl (ref 70–140)
POTASSIUM: 4.1 meq/L (ref 3.5–5.1)
Sodium: 138 mEq/L (ref 136–145)
Total Bilirubin: 0.99 mg/dL (ref 0.20–1.20)
Total Protein: 5.9 g/dL — ABNORMAL LOW (ref 6.4–8.3)

## 2017-07-10 LAB — PROTIME-INR
INR: 1 — AB (ref 2.00–3.50)
PROTIME: 12 s (ref 10.6–13.4)

## 2017-07-10 NOTE — Consult Note (Signed)
Chief Complaint: Patient was seen in consultation today for  Chief Complaint  Patient presents with  . Advice Only    Consult for [facility]   at the request of Feng,Yan  Referring Physician(s): Feng,Yan  History of Present Illness: [name] is a 71 y.o. female with a past medical history significant for oxygen dependent COPD, [name] cirrhosis, esophageal varices, and hepatic encephalopathy who was found on ultrasound to have a new liver lesion. Follow-up MR demonstrated actually 2 lesions both with imaging characteristics consistent with hepatocellular carcinoma in this clinical scenario, in a background of advanced cirrhotic change with stigmata of portal venous hypertension including abdominal ascites. No evidence of extrahepatic spread of disease. Central mesenteric lymph nodes are stable in size. She is scheduled for biopsy of one of the lesions. She's sent for consultation regarding minimally invasive treatment options.  Past Medical History:  Diagnosis Date  . Asthma   . COPD (chronic obstructive pulmonary disease) ([place])   . Diabetes mellitus without complication ([place])   . Esophageal varices ([place])   . Fibromyalgia   . Gastritis   . Hepatic cirrhosis ([place])   . Hepatic encephalopathy ([place])   . Hyperlipidemia   . Hypertension   . IBS (irritable bowel syndrome)   . Splenomegaly   . Vitamin D deficiency     Past Surgical History:  Procedure Laterality Date  . ABDOMINAL HYSTERECTOMY    . BACK SURGERY    . CATARACT EXTRACTION, BILATERAL    . CHOLECYSTECTOMY    . ESOPHAGOGASTRODUODENOSCOPY  07/16/2012   Procedure: ESOPHAGOGASTRODUODENOSCOPY (EGD);  Surgeon: Inda Castle, MD;  Location: [facility];  Service: Endoscopy;  Laterality: N/A;  . GASTRIC VARICES BANDING  07/16/2012   Procedure: GASTRIC VARICES BANDING;  Surgeon: Inda Castle, MD;  Location: [facility];  Service: Endoscopy;  Laterality: N/A;  . HIP ARTHROPLASTY Bilateral   . IR GENERIC HISTORICAL   06/25/2016   IR RADIOLOGIST EVAL & MGMT 06/25/2016 [facility]  . IR GENERIC HISTORICAL  06/27/2016   IR KYPHO LUMBAR INC FX REDUCE BONE BX UNI/BIL CANNULATION INC/IMAGING 06/27/2016 Luanne Bras, MD [facility]  . IR GENERIC HISTORICAL  07/17/2016   IR RADIOLOGIST EVAL & MGMT 07/17/2016 [facility]  . NECK SURGERY    . SHOULDER SURGERY Right     Allergies: Atorvastatin; Diphenhydramine hcl (sleep); Hydrocodone-acetaminophen; Lopid [gemfibrozil]; Loratadine; Lorazepam; Simvastatin; Sulfamethoxazole; and Sulfonamide derivatives  Medications: Prior to Admission medications   Medication Sig Start Date End Date Taking? Authorizing Provider  albuterol (PROVENTIL HFA;VENTOLIN HFA) 108 (90 Base) MCG/ACT inhaler Inhale 2 puffs into the lungs every 6 (six) hours as needed for wheezing or shortness of breath. 05/27/16  Yes Ward, Kristen N, DO  albuterol (PROVENTIL) (2.5 MG/3ML) 0.083% nebulizer solution Take 2.5 mg by nebulization every 4 (four) hours as needed for wheezing or shortness of breath.  04/11/16  Yes [provider]  allopurinol (ZYLOPRIM) 300 MG tablet take 1/2-1 tablet by mouth daily for gout 04/29/17  Yes Unk Pinto, MD  antiseptic oral rinse (BIOTENE) LIQD 15 mLs by Mouth Rinse route as needed for dry mouth. 12/20/16  Yes Vicie Mutters, PA-C  aspirin EC 81 MG tablet Take 81 mg by mouth at bedtime.   Yes [provider]  bisoprolol (ZEBETA) 10 MG tablet Take 1 tablet (10 mg total) by mouth daily. 04/09/17  Yes Vicie Mutters, PA-C  Cholecalciferol (VITAMIN D3) 5000 units CAPS Take 5,000 Units by mouth daily with breakfast.  Yes [provider]  citalopram (CELEXA) 40 MG tablet Take 1 tablet (40 mg total) by mouth 2 (two) times daily. 06/18/17  Yes Vicie Mutters, PA-C  cyclobenzaprine (FLEXERIL) 10 MG tablet Take 1 tablet (10 mg total) by mouth 3 (three) times daily as needed for muscle spasms. 03/06/16  Yes Unk Pinto, MD  ferrous sulfate  325 (65 FE) MG tablet Take 325 mg by mouth daily.    Yes [provider]  furosemide (LASIX) 80 MG tablet Take 1 tablet (80 mg total) by mouth 2 (two) times daily. Take 80 mg in the morning and 40 mg in the evening 02/05/17  Yes Vicie Mutters, PA-C  gabapentin (NEURONTIN) 600 MG tablet Take 1/2 to 1 tablet 3 times a day for neuropathy pain in legs 06/24/17  Yes Vicie Mutters, PA-C  ipratropium (ATROVENT) 0.02 % nebulizer solution use 1 vial in nebulizer EVERY 4 HOURS AS NEEDED FOR WHEEZING OR SHORTNESS OF BREATH 75/15=5 04/30/17  Yes Unk Pinto, MD  lactulose, encephalopathy, ([place]) 10 GM/15ML SOLN take 45ms TWICE DAILY (1892/60= 03/28/17  Yes MUnk Pinto MD  Magnesium 250 MG TABS Take 250 mg by mouth at bedtime.    Yes [provider]  ondansetron (ZOFRAN) 4 MG tablet Take 1 tablet (4 mg total) by mouth every 6 (six) hours as needed for nausea or vomiting. 10/16/16  Yes CVicie Mutters PA-C  OXYGEN Inhale 2 L into the lungs continuous. 3 L with exertion   Yes [provider]  pantoprazole (PROTONIX) 40 MG tablet Take 1 tablet (40 mg total) by mouth daily. 04/22/17  Yes MUnk Pinto MD  potassium chloride (K-DUR,KLOR-CON) 10 MEQ tablet Take 3 tablets (30 mEq total) by mouth 3 (three) times daily. Patient taking differently: Take 10 mEq by mouth 3 (three) times daily.  01/07/17  Yes CVicie Mutters PA-C  rifampin (RIFADIN) 300 MG capsule Take 1 capsule 2 x / day for Liver 07/03/17 01/31/18 Yes MUnk Pinto MD  traMADol (Veatrice Bourbon 50 MG tablet Take 50 mg by mouth every 8 hours as needed for pain 05/07/17  Yes CVicie Mutters PA-C  triamcinolone cream (KENALOG) 0.1 % Apply 1 application topically 3 (three) times daily as needed for pain 12/20/16  Yes CVicie Mutters PA-C  doxycycline (VIBRAMYCIN) 100 MG capsule Take 1 capsule twice daily with food Patient not taking: Reported on 07/10/2017 05/31/17   CVicie Mutters PA-C  glucose blood test strip Take  sugars once daily 01/21/17   CVicie Mutters PA-C  metolazone (ZAROXOLYN) 5 MG tablet Take 2.5 mg by mouth. Every other day 10/09/16   [provider]  nystatin cream (MYCOSTATIN) Apply 1 application topically 2 (two) times daily. 12/20/16   CVicie Mutters PA-C  terconazole (TERAZOL 7) 0.4 % vaginal cream Place 1 applicator vaginally at bedtime. Patient not taking: Reported on 07/10/2017 12/20/16   CVicie Mutters PA-C     Family History  Problem Relation Age of Onset  . Diabetes Brother        deceased  . Heart disease Sister        A Fib  . Heart disease Mother        CHF  . Atrial fibrillation Sister   . Cancer Father        lung cancer   . Cancer Maternal Uncle        unknown type cancer   . Cancer Other        lung cancer   . Colon cancer Neg Hx  Social History   Socioeconomic History  . Marital status: Married    Spouse name: [name]  . Number of children: 5  . Years of education: Not on file  . Highest education level: Not on file  Social Needs  . Financial resource strain: Not on file  . Food insecurity - worry: Not on file  . Food insecurity - inability: Not on file  . Transportation needs - medical: Not on file  . Transportation needs - non-medical: Not on file  Occupational History  . Occupation: Arboriculturist: UNEMPLOYED  Tobacco Use  . Smoking status: Former Smoker    Packs/day: 1.00    Years: 50.00    Pack years: 50.00    Types: Cigarettes    Last attempt to quit: 11/05/2014    Years since quitting: 2.6  . Smokeless tobacco: Never Used  . Tobacco comment: Quit April 2016  Substance and Sexual Activity  . Alcohol use: No    Alcohol/week: 0.0 oz  . Drug use: No  . Sexual activity: Not on file  Other Topics Concern  . Not on file  Social History Narrative  . Not on file    ECOG Status: 2 - Symptomatic, <50% confined to bed  Review of Systems: A 12 point ROS discussed and pertinent positives are indicated in the HPI above.  All  other systems are negative.  Review of Systems  Vital Signs: BP (!) 136/59   Pulse 73   Temp 98.1 F (36.7 C) (Oral)   Resp 15   Ht 5' 2.5" (1.588 m)   Wt 191 lb (86.6 kg)   SpO2 99% Comment: O2 at 2L/Nasal Cannula  BMI 34.38 kg/m   Physical Exam Constitutional: Oriented to person, place, and time. Well-developed and well-nourished. No distress.  obese, examined in wheelchair. HENT:  Head: Normocephalic and atraumatic.  On O2 nasal cannula. Eyes: Conjunctivae and EOM are normal. Right eye exhibits no discharge. Left eye exhibits no discharge. No scleral icterus.  Neck: No JVD present.  Pulmonary/Chest: Effort normal. No stridor. No respiratory distress.  Abdomen: soft, non distended Neurological:  alert and oriented to person, place, and time.  Skin: Skin is warm and dry.  not diaphoretic.  Psychiatric:   normal mood and affect.   behavior is normal. Judgment and thought content normal.   Mallampati Score:     Imaging: Ct Chest W Contrast  Result Date: 07/03/2017 CLINICAL DATA:  Hepatocellular carcinoma.  Staging. EXAM: CT CHEST WITH CONTRAST TECHNIQUE: Multidetector CT imaging of the chest was performed during intravenous contrast administration. CONTRAST:  105m ISOVUE-300 IOPAMIDOL (ISOVUE-300) INJECTION 61% COMPARISON:  None. FINDINGS: Cardiovascular: Normal heart size. No pericardial effusion identified. Aortic atherosclerosis. Calcification within the LAD coronary artery identified. Mediastinum/Nodes: The trachea appears patent and midline. Normal appearance of the esophagus. No enlarged supraclavicular or axillary adenopathy. No mediastinal or hilar adenopathy. Lungs/Pleura: Small pleural effusions identified bilaterally. Subsegmental atelectasis identified within the lingula. Calcified granuloma in the right base. Masslike architectural distortion within the posterior right upper lobe identified. Tiny nodule in the left base measures 2 mm, image 91 of series 5. Within the  superior segment of the right lower lobe there is a 2 mm nodule, image 62 of series 5. Upper Abdomen: The liver is cirrhotic. Stigmata of portal venous hypertension is identified including ascites, varices and splenomegaly. Arterial phase enhancing lesions within both lobes of liver compatible with hepatocellular carcinoma are better seen on recent MRI. Musculoskeletal: Previous right shoulder arthroplasty. Chronic  right posterior rib fracture deformities identified. IMPRESSION: 1. No specific findings identified to suggest metastatic disease to the chest. 2. There are a few tiny less than 5 mm pulmonary nodules within both lungs. Nonspecific. 3. Prior granulomatous disease 4. Small pleural effusions 5. Aortic Atherosclerosis (ICD10-I70.0). LAD coronary artery calcifications. 6. Cirrhosis with stigmata of portal venous hypertension. Electronically Signed   By: Kerby Moors M.D.   On: 07/03/2017 09:19   Mr Liver W ER Contrast  Result Date: 06/25/2017 CLINICAL DATA:  Followup abnormal abdominal ultrasound examination. EXAM: MRI ABDOMEN WITHOUT AND WITH CONTRAST TECHNIQUE: Multiplanar multisequence MR imaging of the abdomen was performed both before and after the administration of intravenous contrast. CONTRAST:  37m MULTIHANCE GADOBENATE DIMEGLUMINE 529 MG/ML IV SOLN COMPARISON:  CT scan 07/11/2016 hand recent ultrasound 06/11/2017 FINDINGS: Lower chest: No worrisome lesions are identified at the lung bases. No pleural or pericardial effusion. Hepatobiliary: Advanced cirrhotic changes involving the liver with portal venous hypertension, portal venous collaterals, esophageal varices, small volume ascites and splenomegaly. The gallbladder surgically absent. Normal caliber and course of the common bile duct. There are 2 early arterial phase enhancing liver lesions consistent with hepatocellular carcinomas. 18.5 x 15.5 lesion in segment 2 and 24 x 23 mm lesion and segment 6. Both lesions demonstrate rapid washout.  Stable small right hepatic lobe cyst. Pancreas: No mass, inflammation or ductal dilatation. Moderate-sized duodenum diverticulum noted near the pancreatic head. Spleen:  Splenomegaly.  No focal lesions. Adrenals/Urinary Tract:  The adrenal glands are unremarkable. There are 2 left-sided renal cyst. A simple cyst in the interpolar region anteriorly and a complex hemorrhagic cyst in the lower pole region posteriorly. Stomach/Bowel: The stomach, duodenum, visualized small bowel and visualize colon are unremarkable. Vascular/Lymphatic: The aorta is normal in caliber. The branch vessels are patent. The major venous structures are patent. The portal and splenic veins are patent. Stable borderline periportal and celiac axis lymph nodes typically seen with cirrhosis. No overt adenopathy. Other:  No abdominal wall hernia or subcutaneous lesions. Musculoskeletal: No significant bony findings. IMPRESSION: 1. Two early arterial phase enhancing liver lesions consistent with hepatocellular carcinomas. 2. Advanced cirrhotic changes involving the liver as described above. There is portal venous hypertension, portal venous collaterals, esophageal varices, splenomegaly and ascites. 3. Stable appearing gastrohepatic ligament, periportal and celiac axis lymph nodes. Electronically Signed   By: PMarijo SanesM.D.   On: 06/25/2017 15:12   UKoreaAbdomen Limited Ruq  Result Date: 06/11/2017 CLINICAL DATA:  Status post cholecystectomy. Cirrhosis. Question ascites. EXAM: ULTRASOUND ABDOMEN LIMITED RIGHT UPPER QUADRANT COMPARISON:  12/01/2016 limited ultrasound.  07/11/2016 CT. FINDINGS: Gallbladder: Surgically absent. Common bile duct: Diameter: Normal, 5 mm. Liver: Heterogeneous liver with an irregular capsule, consistent with cirrhosis. A new left liver lobe mildly heterogeneous hyperechoic lesion measures 2.1 cm, including on image 18. Portal vein is patent on color Doppler imaging with normal direction of blood flow towards the liver.  No evidence of ascites. IMPRESSION: 1. No evidence of ascites. 2. Cirrhosis with a new left liver lobe hyperechoic lesion. This is suspicious for hepatocellular carcinoma. Recommend further evaluation with dedicated pre and post contrast abdominal MRI. These results will be called to the ordering clinician or representative by the Radiologist Assistant, and communication documented in the PACS or zVision Dashboard. Electronically Signed   By: KAbigail MiyamotoM.D.   On: 06/11/2017 17:09    Labs:  CBC: Recent Labs    05/01/17 1129 05/31/17 1157 05/31/17 1257 07/10/17 1223  WBC 4.1 4.6 4.4 4.5  HGB 9.9* 9.6* 10.3* 10.4*  HCT 30.0* 29.0* 31.8* 32.5*  PLT 84* 81* 80.0* 75*    COAGS: Recent Labs    12/02/16 0513 12/07/16 2020 05/31/17 1300 07/10/17 1223  INR 1.10 1.09 1.0 1.00*    BMP: Recent Labs    01/21/17 1042  02/26/17 1602 05/01/17 1129 05/31/17 1157 05/31/17 1257 07/10/17 1223  NA 137   < > 138 141 142 140 138  K 2.9*   < > 3.3* 3.5 4.6 4.5 4.1  CL 92*   < > 92* 97* 100 98  --   CO2 29   < > 35* 35* 35* 39* 35*  GLUCOSE 165*   < > 209* 113* 109* 103* 92  BUN 13   < > _0 13.2  CALCIUM 9.0   < > 8.9 9.0 8.8 9.1 9.0  CREATININE 1.14*   < > 1.18* 1.43* 1.54* 1.38* 1.0  GFRNONAA 49*  --  47* 37* 34*  --   --   GFRAA 56*  --  54* 43* 39*  --   --    < > = values in this interval not displayed.    LIVER FUNCTION TESTS: Recent Labs    01/21/17 1042 02/26/17 1602 05/31/17 1157 05/31/17 1257 07/10/17 1223  BILITOT 0.9 1.1 1.0 1.0 0.99  AST 39* 43* 30 30 32  ALT _1 ALKPHOS 152* 167*  --  118* 117  PROT 5.3* 5.4* 5.4* 5.7* 5.9*  ALBUMIN 3.4* 3.4*  --  3.5 3.1*    TUMOR MARKERS: Recent Labs    12/11/16 1602 05/31/17 1257  AFPTM 5.1 5.5    Assessment and Plan:  My impression is that this patient has multifocal hepatocellular carcinoma in the setting of cirrhosis. The 2 visible lesions are approachable for percutaneous needle ablation  technique. The patient is not considered a surgical resection or transplant candidate.  Reviewed the imaging with the patient and her  significant other. Although the MR findings are considered diagnostic for hepatocellular carcinoma, the AFP was not significantly elevated, so confirmatory biopsies planned. They understand the reasoning for this. Assuming we confirmed hepatocellular carcinoma, she would be a candidate for percutaneous ablation of the lesions under ultrasound or CT guidance. I discussed with the patient the  percutaneous RF ablation procedure, anticipated benefits, and need for continued surveillance for residual/recurrent disease, possible risks and complications. We discussed the possibility that microscopic tumor may not be visible on current imaging. She seemed to understand and did ask appropriate questions which were answered. She is motivated to proceed.  We will await surgical pathology reporting biopsy results. Should this end up being metastatic disease or other tumor, will follow-up in consultation with Dr. Burr Medico. In the event that hepatocellular carcinoma is confirmed, we'll proceed with scheduling of ablation of the 2 lesions, using probably both ultrasound and CT guidance, with MAC.  Thank you for this interesting consult.  I greatly enjoyed meeting REILEY BERTAGNOLLI and look forward to participating in their care.  A copy of this report was sent to the requesting provider on this date.  Electronically Signed: Rickard Rhymes 07/10/2017, 3:41 PM   I spent a total of  40 Minutes   in face to face in clinical consultation, greater than 50% of which was counseling/coordinating care for multifocal hepatocellular carcinoma.

## 2017-07-10 NOTE — Progress Notes (Signed)
  Oncology Nurse Navigator Documentation  Navigator Location: [facility] (07/10/17 1108)   )                        Treatment Phase: Pre-Tx/Tx Discussion (07/10/17 1108)     Interventions: Psycho-social support;None required (07/10/17 1108)    I met with patient and husband prior to [name]'s appointment with Dr. Burr Medico. I provided my contact information and explained my role on the treatment team. Patient verbalized understanding that they can call with questions or concerns.        Acuity: Level 1 (07/10/17 1108)         Time Spent with Patient: 15 (07/10/17 1108)

## 2017-07-11 ENCOUNTER — Encounter: Payer: Self-pay | Admitting: Hematology

## 2017-07-11 LAB — AFP TUMOR MARKER: AFP, Serum, Tumor Marker: 4.5 ng/mL (ref 0.0–8.3)

## 2017-07-15 ENCOUNTER — Other Ambulatory Visit: Payer: Self-pay | Admitting: Radiology

## 2017-07-16 ENCOUNTER — Other Ambulatory Visit: Payer: Self-pay | Admitting: Radiology

## 2017-07-18 ENCOUNTER — Encounter (HOSPITAL_COMMUNITY): Payer: Self-pay

## 2017-07-18 ENCOUNTER — Ambulatory Visit (HOSPITAL_COMMUNITY)
Admission: RE | Admit: 2017-07-18 | Discharge: 2017-07-18 | Disposition: A | Discharge status: Home / Self Care | Payer: PPO | Source: Ambulatory Visit | Attending: Hematology | Admitting: Hematology

## 2017-07-18 DIAGNOSIS — K7689 Other specified diseases of liver: Secondary | ICD-10-CM | POA: Diagnosis not present

## 2017-07-18 DIAGNOSIS — C220 Liver cell carcinoma: Secondary | ICD-10-CM | POA: Diagnosis not present

## 2017-07-18 DIAGNOSIS — R160 Hepatomegaly, not elsewhere classified: Secondary | ICD-10-CM | POA: Diagnosis present

## 2017-07-18 DIAGNOSIS — K7460 Unspecified cirrhosis of liver: Secondary | ICD-10-CM | POA: Diagnosis not present

## 2017-07-18 LAB — CBC
HCT: 35.8 % — ABNORMAL LOW (ref 36.0–46.0)
Hemoglobin: 11.5 g/dL — ABNORMAL LOW (ref 12.0–15.0)
MCH: 33.1 pg (ref 26.0–34.0)
MCHC: 32.1 g/dL (ref 30.0–36.0)
MCV: 103.2 fL — ABNORMAL HIGH (ref 78.0–100.0)
PLATELETS: 90 10*3/uL — AB (ref 150–400)
RBC: 3.47 MIL/uL — ABNORMAL LOW (ref 3.87–5.11)
RDW: 15.1 % (ref 11.5–15.5)
WBC: 4.9 10*3/uL (ref 4.0–10.5)

## 2017-07-18 LAB — APTT: APTT: 32 s (ref 24–36)

## 2017-07-18 LAB — GLUCOSE, CAPILLARY: Glucose-Capillary: 88 mg/dL (ref 65–99)

## 2017-07-18 LAB — PROTIME-INR
INR: 1.1
PROTHROMBIN TIME: 14.1 s (ref 11.4–15.2)

## 2017-07-18 MED ORDER — MIDAZOLAM HCL 2 MG/2ML IJ SOLN
INTRAMUSCULAR | Status: AC
  Filled 2017-07-18: qty 2

## 2017-07-18 MED ORDER — LIDOCAINE HCL 2 % IJ SOLN
INTRAMUSCULAR | Status: AC
  Filled 2017-07-18: qty 10

## 2017-07-18 MED ORDER — FENTANYL CITRATE (PF) 100 MCG/2ML IJ SOLN
50.0000 ug | Freq: Once | INTRAMUSCULAR | Status: AC
  Administered 2017-07-18: 50 ug via INTRAVENOUS

## 2017-07-18 MED ORDER — MIDAZOLAM HCL 2 MG/2ML IJ SOLN
INTRAMUSCULAR | Status: AC | PRN
  Administered 2017-07-18: 1 mg via INTRAVENOUS

## 2017-07-18 MED ORDER — LIDOCAINE HCL 1 % IJ SOLN
INTRAMUSCULAR | Status: AC | PRN
  Administered 2017-07-18: 20 mL

## 2017-07-18 MED ORDER — SODIUM CHLORIDE 0.9 % IV SOLN
INTRAVENOUS | Status: DC
  Administered 2017-07-18: 12:00:00 via INTRAVENOUS

## 2017-07-18 MED ORDER — FENTANYL CITRATE (PF) 100 MCG/2ML IJ SOLN
INTRAMUSCULAR | Status: AC | PRN
  Administered 2017-07-18: 50 ug via INTRAVENOUS

## 2017-07-18 MED ORDER — FENTANYL CITRATE (PF) 100 MCG/2ML IJ SOLN
INTRAMUSCULAR | Status: AC
  Administered 2017-07-18: 50 ug via INTRAVENOUS
  Filled 2017-07-18: qty 2

## 2017-07-18 NOTE — Procedures (Signed)
  Procedure: US liver lesion biopsy  18g x3 to surg path Preprocedure diagnosis: Hepatoma Postprocedure diagnosis: same EBL:   minimal Complications:  none immediate  See full dictation in [facility].  Dillard Cannon MD Main # [phone] Pager  [phone]

## 2017-07-18 NOTE — H&P (Signed)
Referring Physician(s): Feng,Yan  Supervising Physician: Arne Cleveland  Patient Status:  [facility] OP  Chief Complaint:  "I'm here for a biopsy"  Subjective: Pt familiar to IR service from prior L1 KP in 2017. She was seen in consultation by Dr. Vernard Gambles to discuss treatment options for recently  noted hepatic lesions concerning for [facility].  She was deemed an appropriate candidate for CT-guided percutaneous RF ablation of the lesions but needs confirmatory biopsy before proceeding.  She presents today for image guided biopsy of liver lesion.  Past medical history also significant for oxygen dependent COPD, DM, HTN,IBS, NASH cirrhosis, esophageal varices and hepatic encephalopathy.  She currently denies fever, chest pain, vomiting or abnormal bleeding.  She does have neck pain/occasional headaches, chronic dyspnea, abdominal/back pain and intermittent nausea.  Past Medical History:  Diagnosis Date  . Asthma   . COPD (chronic obstructive pulmonary disease) ([place])   . Diabetes mellitus without complication ([place])   . Esophageal varices ([place])   . Fibromyalgia   . Gastritis   . Hepatic cirrhosis ([place])   . Hepatic encephalopathy ([place])   . Hyperlipidemia   . Hypertension   . IBS (irritable bowel syndrome)   . Splenomegaly   . Vitamin D deficiency    Past Surgical History:  Procedure Laterality Date  . ABDOMINAL HYSTERECTOMY    . BACK SURGERY    . CATARACT EXTRACTION, BILATERAL    . CHOLECYSTECTOMY    . ESOPHAGOGASTRODUODENOSCOPY  07/16/2012   Procedure: ESOPHAGOGASTRODUODENOSCOPY (EGD);  Surgeon: Inda Castle, MD;  Location: [facility];  Service: Endoscopy;  Laterality: N/A;  . GASTRIC VARICES BANDING  07/16/2012   Procedure: GASTRIC VARICES BANDING;  Surgeon: Inda Castle, MD;  Location: [facility];  Service: Endoscopy;  Laterality: N/A;  . HIP ARTHROPLASTY Bilateral   . IR GENERIC HISTORICAL  06/25/2016   IR RADIOLOGIST EVAL & MGMT 06/25/2016 [facility]  . IR GENERIC  HISTORICAL  06/27/2016   IR KYPHO LUMBAR INC FX REDUCE BONE BX UNI/BIL CANNULATION INC/IMAGING 06/27/2016 Luanne Bras, MD [facility]  . IR GENERIC HISTORICAL  07/17/2016   IR RADIOLOGIST EVAL & MGMT 07/17/2016 [facility]  . IR RADIOLOGIST EVAL & MGMT  07/10/2017  . NECK SURGERY    . SHOULDER SURGERY Right     Allergies: Atorvastatin; Diphenhydramine hcl (sleep); Hydrocodone-acetaminophen; Lopid [gemfibrozil]; Loratadine; Lorazepam; Simvastatin; Sulfamethoxazole; and Sulfonamide derivatives  Medications: Prior to Admission medications   Medication Sig Start Date End Date Taking? Authorizing Provider  albuterol (PROVENTIL HFA;VENTOLIN HFA) 108 (90 Base) MCG/ACT inhaler Inhale 2 puffs into the lungs every 6 (six) hours as needed for wheezing or shortness of breath. 05/27/16  Yes Ward, Kristen N, DO  albuterol (PROVENTIL) (2.5 MG/3ML) 0.083% nebulizer solution Take 2.5 mg by nebulization every 4 (four) hours as needed for wheezing or shortness of breath.  04/11/16  Yes [provider]  allopurinol (ZYLOPRIM) 300 MG tablet take 1/2-1 tablet by mouth daily for gout 04/29/17  Yes Unk Pinto, MD  aspirin EC 81 MG tablet Take 81 mg by mouth at bedtime.   Yes [provider]  bisoprolol (ZEBETA) 10 MG tablet Take 1 tablet (10 mg total) by mouth daily. 04/09/17  Yes Vicie Mutters, PA-C  Cholecalciferol (VITAMIN D3) 5000 units CAPS Take 5,000 Units by mouth daily with breakfast.   Yes [provider]  citalopram (CELEXA) 40 MG tablet Take 1 tablet (40 mg total) by mouth 2 (two) times daily. 06/18/17  Yes Vicie Mutters, PA-C  cyclobenzaprine (FLEXERIL)  10 MG tablet Take 1 tablet (10 mg total) by mouth 3 (three) times daily as needed for muscle spasms. 03/06/16  Yes Unk Pinto, MD  ferrous sulfate 325 (65 FE) MG tablet Take 325 mg by mouth daily.    Yes [provider]  furosemide (LASIX) 80 MG tablet Take 1 tablet (80 mg total) by mouth 2 (two) times  daily. Take 80 mg in the morning and 40 mg in the evening 02/05/17  Yes Vicie Mutters, PA-C  gabapentin (NEURONTIN) 600 MG tablet Take 1/2 to 1 tablet 3 times a day for neuropathy pain in legs 06/24/17  Yes Vicie Mutters, PA-C  ipratropium (ATROVENT) 0.02 % nebulizer solution use 1 vial in nebulizer EVERY 4 HOURS AS NEEDED FOR WHEEZING OR SHORTNESS OF BREATH 75/15=5 04/30/17  Yes Unk Pinto, MD  lactulose, encephalopathy, ([place]) 10 GM/15ML SOLN take 71ms TWICE DAILY (1892/60= 03/28/17  Yes MUnk Pinto MD  Magnesium 250 MG TABS Take 250 mg by mouth at bedtime.    Yes [provider]  metolazone (ZAROXOLYN) 5 MG tablet Take 2.5 mg by mouth. Every other day 10/09/16  Yes [provider]  nystatin cream (MYCOSTATIN) Apply 1 application topically 2 (two) times daily. 12/20/16  Yes CVicie Mutters PA-C  ondansetron (ZOFRAN) 4 MG tablet Take 1 tablet (4 mg total) by mouth every 6 (six) hours as needed for nausea or vomiting. 10/16/16  Yes CVicie Mutters PA-C  OXYGEN Inhale 2 L into the lungs continuous. 3 L with exertion   Yes [provider]  pantoprazole (PROTONIX) 40 MG tablet Take 1 tablet (40 mg total) by mouth daily. 04/22/17  Yes MUnk Pinto MD  potassium chloride (K-DUR,KLOR-CON) 10 MEQ tablet Take 3 tablets (30 mEq total) by mouth 3 (three) times daily. Patient taking differently: Take 10 mEq by mouth 3 (three) times daily.  01/07/17  Yes CVicie Mutters PA-C  rifampin (RIFADIN) 300 MG capsule Take 1 capsule 2 x / day for Liver 07/03/17 01/31/18 Yes MUnk Pinto MD  traMADol (Veatrice [place] 50 MG tablet Take 50 mg by mouth every 8 hours as needed for pain 05/07/17  Yes CVicie Mutters PA-C  triamcinolone cream (KENALOG) 0.1 % Apply 1 application topically 3 (three) times daily as needed for pain 12/20/16  Yes CVicie Mutters PA-C  antiseptic oral rinse (BIOTENE) LIQD 15 mLs by Mouth Rinse route as needed for dry mouth. 12/20/16   CVicie Mutters PA-C    doxycycline (VIBRAMYCIN) 100 MG capsule Take 1 capsule twice daily with food Patient not taking: Reported on 07/10/2017 05/31/17   CVicie Mutters PA-C  glucose blood test strip Take sugars once daily 01/21/17   CVicie Mutters PA-C  terconazole (TERAZOL 7) 0.4 % vaginal cream Place 1 applicator vaginally at bedtime. Patient not taking: Reported on 07/10/2017 12/20/16   CVicie Mutters PA-C     Vital Signs: BP (!) 132/57 (BP Location: Right Arm)   Pulse 64   Temp 98.1 F (36.7 C) (Oral)   Resp 18   SpO2 92%   Physical Exam awake, alert.  Chest with distant breath sounds bilaterally.  Heart with regular rate and rhythm.  Abdomen obese, distended, positive bowel sounds, mild generalized tenderness to palpation; chronic venous stasis changes of both lower extremities with edema  Imaging: No results found.  Labs:  CBC: Recent Labs    05/01/17 1129 05/31/17 1157 05/31/17 1257 07/10/17 1223  WBC 4.1 4.6 4.4 4.5  HGB 9.9* 9.6* 10.3* 10.4*  HCT 30.0* 29.0* 31.8* 32.5*  PLT  84* 81* 80.0* 75*    COAGS: Recent Labs    12/02/16 0513 12/07/16 2020 05/31/17 1300 07/10/17 1223  INR 1.10 1.09 1.0 1.00*    BMP: Recent Labs    01/21/17 1042  02/26/17 1602 05/01/17 1129 05/31/17 1157 05/31/17 1257 07/10/17 1223  NA 137   < > 138 141 142 140 138  K 2.9*   < > 3.3* 3.5 4.6 4.5 4.1  CL 92*   < > 92* 97* 100 98  --   CO2 29   < > 35* 35* 35* 39* 35*  GLUCOSE 165*   < > 209* 113* 109* 103* 92  BUN 13   < > 17 22 22 23  13.2  CALCIUM 9.0   < > 8.9 9.0 8.8 9.1 9.0  CREATININE 1.14*   < > 1.18* 1.43* 1.54* 1.38* 1.0  GFRNONAA 49*  --  47* 37* 34*  --   --   GFRAA 56*  --  54* 43* 39*  --   --    < > = values in this interval not displayed.    LIVER FUNCTION TESTS: Recent Labs    01/21/17 1042 02/26/17 1602 05/31/17 1157 05/31/17 1257 07/10/17 1223  BILITOT 0.9 1.1 1.0 1.0 0.99  AST 39* 43* 30 30 32  ALT 25 29 20 18 18   ALKPHOS 152* 167*  --  118* 117  PROT 5.3*  5.4* 5.4* 5.7* 5.9*  ALBUMIN 3.4* 3.4*  --  3.5 3.1*    Assessment and Plan:  71 y.o. female with a past medical history significant for oxygen dependent COPD, NASH cirrhosis, esophageal varices, and hepatic encephalopathy who was found on ultrasound to have a new liver lesion. Follow-up MR demonstrated actually 2 lesions both with imaging characteristics consistent with hepatocellular carcinoma in this clinical scenario, in a background of advanced cirrhotic change with stigmata of portal venous hypertension including abdominal ascites. No evidence of extrahepatic spread of disease. Central mesenteric lymph nodes are stable in size.she was seen in consultation by Dr. Vernard Gambles on 07/10/17 and deemed an appropriate candidate for CT-guided radiofrequency ablation of the liver lesions.  She presents today for image guided liver lesion biopsy for confirmatory pathology prior to ablative therapy.Risks and benefits discussed with the patient/spouse including, but not limited to bleeding, infection, damage to adjacent structures or low yield requiring additional tests.All of the patient's questions were answered, patient is agreeable to proceed.Consent signed and in chart.       Electronically Signed: D. Rowe Robert, PA-C 07/18/2017, 11:49 AM   I spent a total of 25 minutes at the the patient's bedside AND on the patient's hospital floor or unit, greater than 50% of which was counseling/coordinating care for image guided liver lesion biopsy

## 2017-07-18 NOTE — Discharge Instructions (Signed)
Moderate Conscious Sedation, Adult, Care After °These instructions provide you with information about caring for yourself after your procedure. Your health care provider may also give you more specific instructions. Your treatment has been planned according to current medical practices, but problems sometimes occur. Call your health care provider if you have any problems or questions after your procedure. °What can I expect after the procedure? °After your procedure, it is common: °· To feel sleepy for several hours. °· To feel clumsy and have poor balance for several hours. °· To have poor judgment for several hours. °· To vomit if you eat too soon. ° °Follow these instructions at home: °For at least 24 hours after the procedure: ° °· Do not: °? Participate in activities where you could fall or become injured. °? Drive. °? Use heavy machinery. °? Drink alcohol. °? Take sleeping pills or medicines that cause drowsiness. °? Make important decisions or sign legal documents. °? Take care of children on your own. °· Rest. °Eating and drinking °· Follow the diet recommended by your health care provider. °· If you vomit: °? Drink water, juice, or soup when you can drink without vomiting. °? Make sure you have little or no nausea before eating solid foods. °General instructions °· Have a responsible adult stay with you until you are awake and alert. °· Take over-the-counter and prescription medicines only as told by your health care provider. °· If you smoke, do not smoke without supervision. °· Keep all follow-up visits as told by your health care provider. This is important. °Contact a health care provider if: °· You keep feeling nauseous or you keep vomiting. °· You feel light-headed. °· You develop a rash. °· You have a fever. °Get help right away if: °· You have trouble breathing. °This information is not intended to replace advice given to you by your health care provider. Make sure you discuss any questions you have  with your health care provider. °Document Released: 05/13/2013 Document Revised: 12/26/2015 Document Reviewed: 11/12/2015 °Elsevier Interactive Patient Education © 2018 Else[employer] Inc[employer] ° ° °Liver Biopsy, Care After °These instructions give you information on caring for yourself after your procedure. Your doctor may also give you more specific instructions. Call your doctor if you have any problems or questions after your procedure. °Follow these instructions at home: °· Rest at home for 1-2 days or as told by your doctor. °· Have someone stay with you for at least 24 hours. °· Do not do these things in the first 24 hours: °? Drive. °? Use machinery. °? Take care of other people. °? Sign legal documents. °? Take a bath or shower. °· There are many different ways to close and cover a cut (incision). For example, a cut can be closed with stitches, skin glue, or adhesive strips. Follow your doctor's instructions on: °? Taking care of your cut. °? Changing and removing your bandage (dressing). °? Removing whatever was used to close your cut. °· Do not drink alcohol in the first week. °· Do not lift more than 5 pounds or play contact sports for the first 2 weeks. °· Take medicines only as told by your doctor. For 1 week, do not take medicine that has aspirin in it or medicines like ibuprofen. °· Get your test results. °Contact a doctor if: °· A cut bleeds and leaves more than just a small spot of blood. °· A cut is red, puffs up (swells), or hurts more than before. °· Fluid or something else   comes from a cut. °· A cut smells bad. °· You have a fever or chills. °Get help right away if: °· You have swelling, bloating, or pain in your belly (abdomen). °· You get dizzy or faint. °· You have a rash. °· You feel sick to your stomach (nauseous) or throw up (vomit). °· You have trouble breathing, feel short of breath, or feel faint. °· Your chest hurts. °· You have problems talking or seeing. °· You have trouble balancing or moving  your arms or legs. °This information is not intended to replace advice given to you by your health care provider. Make sure you discuss any questions you have with your health care provider. °Document Released: 05/01/2008 Document Revised: 12/29/2015 Document Reviewed: 09/18/2013 °Elsevier Interactive Patient Education © 2018 Elsevier Inc. ° ° °

## 2017-07-19 DIAGNOSIS — J449 Chronic obstructive pulmonary disease, unspecified: Secondary | ICD-10-CM | POA: Diagnosis not present

## 2017-07-22 ENCOUNTER — Telehealth: Payer: Self-pay | Admitting: *Deleted

## 2017-07-22 NOTE — Telephone Encounter (Signed)
Called pt at home and spoke with husband [name].  Informed [name] of pt's liver biopsy confirmed liver cancer ;  Pt to follow up with IR for liver targeted therapy.  Husband understood that IR scheduler will contact pt with appt date and time.

## 2017-07-23 ENCOUNTER — Other Ambulatory Visit: Payer: Self-pay | Admitting: Interventional Radiology

## 2017-07-23 DIAGNOSIS — C220 Liver cell carcinoma: Secondary | ICD-10-CM

## 2017-07-26 DIAGNOSIS — J449 Chronic obstructive pulmonary disease, unspecified: Secondary | ICD-10-CM | POA: Diagnosis not present

## 2017-08-08 ENCOUNTER — Other Ambulatory Visit: Payer: Self-pay

## 2017-08-08 ENCOUNTER — Other Ambulatory Visit: Payer: Self-pay | Admitting: Internal Medicine

## 2017-08-08 DIAGNOSIS — K7460 Unspecified cirrhosis of liver: Secondary | ICD-10-CM

## 2017-08-08 NOTE — Patient Outreach (Signed)
[facility]) Care Management  08/08/2017  [name] [birth year] [id number]   Telephone Screen  Referral Date: 08/08/17 Referral Source: [employer] Referral Reason: " reported worsening of physical health, COPD, pain" Insurance: HTA   Outreach attempt #1 to patient. Spoke with spouse who requested call be completed with him as he handles patient's affairs. Patient was able to give verbal consent for RN CM to complete call with spouse.   Social: Patient resides in the home along with spouse. He voices that patient requires assistance with all ADLs/IADLs. He denies any recent falls within the past year. He voices DME in the home include walker and support chair. Spouse takes patient back and forth to MD appts.    Conditions: Per chart review patient has PMH of COPD-oxygen dependent, DM(diet controlled), HTN, NASH cirrhosis, esophageal varices, hepatic encephalopathy, HLD, fibromyalgia. Patient with recent diagnosis of live cancer last month. Spouse stets that patient is going for further testing and evaluation in a few weeks to determine treatment options. Since diabetes is diet controlled spouse not monitoring cbgs on regular basis. He voices when he normally checks blood sugars it is in the 90s. Last A1C was 5.9(Oct 2018).   Medications: Spouse managing and assisting patient with taking meds. He voices patient takes about 10 meds. He denies any questions/concerns regarding meds.   Appointments: Patient followed by several MDs currently and keeps appts with MDs.   Consent: [facility] services reviewed and discussed with spouse. He voices there is "nothing you can do to help right now." he was appreciative of call but declined [facility] services at this time. He is agreeable to RN CM mailing out [facility] brochure and info for future reference.    Plan: RN CM will notify [facility] administrative assistant of case status. RN CM will send patient [facility] letter along with brochure and magnet.     Kerry Montgomery, RN,BSN,CCM [place] Management Telephonic Care Management Coordinator Direct Phone: [phone] Toll Free: [phone] Fax: [phone]

## 2017-08-09 ENCOUNTER — Other Ambulatory Visit: Payer: Self-pay | Admitting: Internal Medicine

## 2017-08-19 DIAGNOSIS — J449 Chronic obstructive pulmonary disease, unspecified: Secondary | ICD-10-CM | POA: Diagnosis not present

## 2017-08-23 ENCOUNTER — Other Ambulatory Visit: Payer: Self-pay | Admitting: General Surgery

## 2017-08-23 NOTE — Patient Instructions (Signed)
[name]  08/23/2017   Your procedure is scheduled on: 08/30/2017   Report to [facility] Main  Entrance             Report to Admitting at 0630am.  Once checked in at Admitting then Check in Radiology then to Short Stay where the nurses will prepare you for your procedure.    Call this number if you have problems the morning of surgery [phone]   Remember: Do not eat food or drink liquids :After Midnight.     Take these medicines the morning of surgery with A SIP OF WATER: Inhalers as usual and bring, Allopurinol, Zebeta( Bisoprolol), Celexa, Protonix, Nebulizer if needed, Gabapentin  DO NOT TAKE ANY DIABETIC MEDICATIONS DAY OF YOUR SURGERY                               You may not have any metal on your body including hair pins and              piercings  Do not wear jewelry, make-up, lotions, powders or perfumes, deodorant             Do not wear nail polish.  Do not shave  48 hours prior to surgery.             Do not bring valuables to the hospital. [place].  Contacts, dentures or bridgework may not be worn into surgery.  Leave suitcase in the car. After surgery it may be brought to your room.                     Please read over the following fact sheets you were given: _____________________________________________________________________             [facility] - Preparing for Surgery Before surgery, you can play an important role.  Because skin is not sterile, your skin needs to be as free of germs as possible.  You can reduce the number of germs on your skin by washing with CHG (chlorahexidine gluconate) soap before surgery.  CHG is an antiseptic cleaner which kills germs and bonds with the skin to continue killing germs even after washing. Please DO NOT use if you have an allergy to CHG or antibacterial soaps.  If your skin becomes reddened/irritated stop using the CHG and inform your  nurse when you arrive at Short Stay. Do not shave (including legs and underarms) for at least 48 hours prior to the first CHG shower.  You may shave your face/neck. Please follow these instructions carefully:  1.  Shower with CHG Soap the night before surgery and the  morning of Surgery.  2.  If you choose to wash your hair, wash your hair first as usual with your  normal  shampoo.  3.  After you shampoo, rinse your hair and body thoroughly to remove the  shampoo.                           4.  Use CHG as you would any other liquid soap.  You can apply chg directly  to the skin and wash  Gently with a scrungie or clean washcloth.  5.  Apply the CHG Soap to your body ONLY FROM THE NECK DOWN.   Do not use on face/ open                           Wound or open sores. Avoid contact with eyes, ears mouth and genitals (private parts).                       Wash face,  Genitals (private parts) with your normal soap.             6.  Wash thoroughly, paying special attention to the area where your surgery  will be performed.  7.  Thoroughly rinse your body with warm water from the neck down.  8.  DO NOT shower/wash with your normal soap after using and rinsing off  the CHG Soap.                9.  Pat yourself dry with a clean towel.            10.  Wear clean pajamas.            11.  Place clean sheets on your bed the night of your first shower and do not  sleep with pets. Day of Surgery : Do not apply any lotions/deodorants the morning of surgery.  Please wear clean clothes to the hospital/surgery center.  FAILURE TO FOLLOW THESE INSTRUCTIONS MAY RESULT IN THE CANCELLATION OF YOUR SURGERY PATIENT SIGNATURE_________________________________  NURSE SIGNATURE__________________________________  ________________________________________________________________________ How to Manage Your Diabetes Before and After Surgery  Why is it important to control my blood sugar before and after  surgery? . Improving blood sugar levels before and after surgery helps healing and can limit problems. . A way of improving blood sugar control is eating a healthy diet by: o  Eating less sugar and carbohydrates o  Increasing activity/exercise o  Talking with your doctor about reaching your blood sugar goals . High blood sugars (greater than 180 mg/dL) can raise your risk of infections and slow your recovery, so you will need to focus on controlling your diabetes during the weeks before surgery. . Make sure that the doctor who takes care of your diabetes knows about your planned surgery including the date and location.  How do I manage my blood sugar before surgery? . Check your blood sugar at least 4 times a day, starting 2 days before surgery, to make sure that the level is not too high or low. o Check your blood sugar the morning of your surgery when you wake up and every 2 hours until you get to the Short Stay unit. . If your blood sugar is less than 70 mg/dL, you will need to treat for low blood sugar: o Do not take insulin. o Treat a low blood sugar (less than 70 mg/dL) with  cup of clear juice (cranberry or apple), 4 glucose tablets, OR glucose gel. o Recheck blood sugar in 15 minutes after treatment (to make sure it is greater than 70 mg/dL). If your blood sugar is not greater than 70 mg/dL on recheck, call [phone] for further instructions. . Report your blood sugar to the short stay nurse when you get to Short Stay.  . If you are admitted to the hospital after surgery: o Your blood sugar will be checked by the staff and you will probably be  given insulin after surgery (instead of oral diabetes medicines) to make sure you have good blood sugar levels. o The goal for blood sugar control after surgery is 80-180 mg/dL.   WHAT DO I DO ABOUT MY DIABETES MEDICATION?  Marland Kitchen Do not take oral diabetes medicines (pills) the morning of surgery.  . THE NIGHT BEFORE SURGERY, take     units of        insulin.       . THE MORNING OF SURGERY, take   units of         insulin.  . The day of surgery, do not take other diabetes injectables, including Byetta (exenatide), Bydureon (exenatide ER), Victoza (liraglutide), or Trulicity (dulaglutide).  . If your CBG is greater than 220 mg/dL, you may take  of your sliding scale  . (correction) dose of insulin.    For patients with insulin pumps: Contact your diabetes doctor for specific instructions before surgery. Decrease basal rates by 20% at midnight the night before your surgery. Note that if your surgery is planned to be longer than 2 hours, your insulin pump will be removed and intravenous (IV) insulin will be started and managed by the nurses and the anesthesiologist. You will be able to restart your insulin pump once you are awake and able to manage it.  Make sure to bring insulin pump supplies to the hospital with you in case the  site needs to be changed.  Patient Signature:  Date:   Nurse Signature:  Date:   Reviewed and Endorsed by [facility] Patient Education Committee, August 2015

## 2017-08-26 DIAGNOSIS — J449 Chronic obstructive pulmonary disease, unspecified: Secondary | ICD-10-CM | POA: Diagnosis not present

## 2017-08-27 ENCOUNTER — Other Ambulatory Visit: Payer: Self-pay

## 2017-08-27 ENCOUNTER — Encounter (INDEPENDENT_AMBULATORY_CARE_PROVIDER_SITE_OTHER): Payer: Self-pay

## 2017-08-27 ENCOUNTER — Encounter (HOSPITAL_COMMUNITY)
Admission: RE | Admit: 2017-08-27 | Discharge: 2017-08-27 | Disposition: A | Discharge status: Home / Self Care | Payer: PPO | Source: Ambulatory Visit | Attending: Interventional Radiology | Admitting: Interventional Radiology

## 2017-08-27 ENCOUNTER — Encounter (HOSPITAL_COMMUNITY): Payer: Self-pay

## 2017-08-27 DIAGNOSIS — Z01818 Encounter for other preprocedural examination: Secondary | ICD-10-CM

## 2017-08-27 DIAGNOSIS — J449 Chronic obstructive pulmonary disease, unspecified: Secondary | ICD-10-CM | POA: Diagnosis not present

## 2017-08-27 DIAGNOSIS — M797 Fibromyalgia: Secondary | ICD-10-CM | POA: Diagnosis not present

## 2017-08-27 DIAGNOSIS — C220 Liver cell carcinoma: Secondary | ICD-10-CM | POA: Diagnosis not present

## 2017-08-27 DIAGNOSIS — Z7982 Long term (current) use of aspirin: Secondary | ICD-10-CM | POA: Diagnosis not present

## 2017-08-27 DIAGNOSIS — E785 Hyperlipidemia, unspecified: Secondary | ICD-10-CM | POA: Diagnosis not present

## 2017-08-27 DIAGNOSIS — K7581 Nonalcoholic steatohepatitis (NASH): Secondary | ICD-10-CM | POA: Diagnosis not present

## 2017-08-27 DIAGNOSIS — Z885 Allergy status to narcotic agent status: Secondary | ICD-10-CM | POA: Diagnosis not present

## 2017-08-27 DIAGNOSIS — K219 Gastro-esophageal reflux disease without esophagitis: Secondary | ICD-10-CM | POA: Diagnosis not present

## 2017-08-27 DIAGNOSIS — Z79899 Other long term (current) drug therapy: Secondary | ICD-10-CM | POA: Diagnosis not present

## 2017-08-27 DIAGNOSIS — I10 Essential (primary) hypertension: Secondary | ICD-10-CM | POA: Diagnosis not present

## 2017-08-27 DIAGNOSIS — E559 Vitamin D deficiency, unspecified: Secondary | ICD-10-CM | POA: Diagnosis not present

## 2017-08-27 DIAGNOSIS — Z882 Allergy status to sulfonamides status: Secondary | ICD-10-CM | POA: Diagnosis not present

## 2017-08-27 DIAGNOSIS — E119 Type 2 diabetes mellitus without complications: Secondary | ICD-10-CM | POA: Diagnosis not present

## 2017-08-27 DIAGNOSIS — K7460 Unspecified cirrhosis of liver: Secondary | ICD-10-CM | POA: Diagnosis not present

## 2017-08-27 HISTORY — DX: Edema, unspecified: R60.9

## 2017-08-27 HISTORY — DX: Phlebitis and thrombophlebitis of unspecified site: I80.9

## 2017-08-27 HISTORY — DX: Major depressive disorder, single episode, unspecified: F32.9

## 2017-08-27 HISTORY — DX: Malignant (primary) neoplasm, unspecified: C80.1

## 2017-08-27 HISTORY — DX: Personal history of other medical treatment: Z92.89

## 2017-08-27 HISTORY — DX: Depression, unspecified: F32.A

## 2017-08-27 HISTORY — DX: Pneumonia, unspecified organism: J18.9

## 2017-08-27 LAB — CBC WITH DIFFERENTIAL/PLATELET
BASOS PCT: 0 %
Basophils Absolute: 0 10*3/uL (ref 0.0–0.1)
EOS PCT: 3 %
Eosinophils Absolute: 0.1 10*3/uL (ref 0.0–0.7)
HCT: 34.4 % — ABNORMAL LOW (ref 36.0–46.0)
Hemoglobin: 10.9 g/dL — ABNORMAL LOW (ref 12.0–15.0)
Lymphocytes Relative: 19 %
Lymphs Abs: 0.9 10*3/uL (ref 0.7–4.0)
MCH: 33.2 pg (ref 26.0–34.0)
MCHC: 31.7 g/dL (ref 30.0–36.0)
MCV: 104.9 fL — AB (ref 78.0–100.0)
MONO ABS: 0.2 10*3/uL (ref 0.1–1.0)
Monocytes Relative: 5 %
NEUTROS ABS: 3.5 10*3/uL (ref 1.7–7.7)
Neutrophils Relative %: 73 %
PLATELETS: 81 10*3/uL — AB (ref 150–400)
RBC: 3.28 MIL/uL — ABNORMAL LOW (ref 3.87–5.11)
RDW: 15.3 % (ref 11.5–15.5)
WBC: 4.7 10*3/uL (ref 4.0–10.5)

## 2017-08-27 LAB — COMPREHENSIVE METABOLIC PANEL
ALBUMIN: 3.3 g/dL — AB (ref 3.5–5.0)
ALT: 22 U/L (ref 14–54)
ANION GAP: 8 (ref 5–15)
AST: 34 U/L (ref 15–41)
Alkaline Phosphatase: 118 U/L (ref 38–126)
BILIRUBIN TOTAL: 1.1 mg/dL (ref 0.3–1.2)
BUN: 14 mg/dL (ref 6–20)
CO2: 33 mmol/L — ABNORMAL HIGH (ref 22–32)
Calcium: 8.9 mg/dL (ref 8.9–10.3)
Chloride: 96 mmol/L — ABNORMAL LOW (ref 101–111)
Creatinine, Ser: 1.11 mg/dL — ABNORMAL HIGH (ref 0.44–1.00)
GFR calc Af Amer: 57 mL/min — ABNORMAL LOW (ref >60)
GFR, EST NON AFRICAN AMERICAN: 49 mL/min — AB (ref >60)
GLUCOSE: 131 mg/dL — AB (ref 65–99)
POTASSIUM: 4.5 mmol/L (ref 3.5–5.1)
Sodium: 137 mmol/L (ref 135–145)
TOTAL PROTEIN: 5.8 g/dL — AB (ref 6.5–8.1)

## 2017-08-27 LAB — HEMOGLOBIN A1C
Hgb A1c MFr Bld: 5.1 % (ref 4.8–5.6)
Mean Plasma Glucose: 99.67 mg/dL

## 2017-08-27 LAB — PROTIME-INR
INR: 1.1
Prothrombin Time: 14.1 seconds (ref 11.4–15.2)

## 2017-08-27 LAB — APTT: aPTT: 34 seconds (ref 24–36)

## 2017-08-27 NOTE — Progress Notes (Signed)
CMP done 08/27/17 routed via epic to  Dr Vernard Gambles.

## 2017-08-27 NOTE — Progress Notes (Signed)
Final EKG done 08/27/17-epic

## 2017-08-27 NOTE — Progress Notes (Signed)
Dr Valma Cava ( anesthesia) made aware of patient hx of COPD and other history  along with 07/03/17- Ct chest results and EKG result from 08/27/2017.  Sat- 100% on 2 liters.  No new orders given.

## 2017-08-28 ENCOUNTER — Other Ambulatory Visit: Payer: Self-pay | Admitting: Radiology

## 2017-08-28 NOTE — Progress Notes (Signed)
CT chest- 07/03/17-epic ECHO-08/08/15-epic  EKG-12/01/16-epic

## 2017-08-28 NOTE — Progress Notes (Signed)
CBC done 08/27/2017 faxed via epic to Dr Arne Cleveland.

## 2017-08-28 NOTE — Progress Notes (Signed)
Called patient's husband at home and verified times of day for pills and instructed husband for patient to take the following on am of procedure: Bring Inhaler to hospital

## 2017-08-30 ENCOUNTER — Ambulatory Visit (HOSPITAL_COMMUNITY)
Admission: RE | Admit: 2017-08-30 | Discharge: 2017-08-31 | Disposition: A | Discharge status: Home / Self Care | Payer: PPO | Source: Ambulatory Visit | Attending: Interventional Radiology | Admitting: Interventional Radiology

## 2017-08-30 ENCOUNTER — Other Ambulatory Visit: Payer: Self-pay

## 2017-08-30 ENCOUNTER — Ambulatory Visit (HOSPITAL_COMMUNITY): Payer: PPO | Admitting: Certified Registered Nurse Anesthetist

## 2017-08-30 ENCOUNTER — Ambulatory Visit (HOSPITAL_COMMUNITY)
Admission: RE | Admit: 2017-08-30 | Discharge: 2017-08-30 | Disposition: A | Discharge status: Home / Self Care | Payer: PPO | Source: Ambulatory Visit | Attending: Interventional Radiology | Admitting: Interventional Radiology

## 2017-08-30 ENCOUNTER — Encounter (HOSPITAL_COMMUNITY)
Admission: RE | Disposition: A | Discharge status: Home / Self Care | Payer: Self-pay | Source: Ambulatory Visit | Attending: Interventional Radiology

## 2017-08-30 ENCOUNTER — Encounter (HOSPITAL_COMMUNITY): Payer: Self-pay

## 2017-08-30 ENCOUNTER — Ambulatory Visit (HOSPITAL_COMMUNITY): Payer: PPO

## 2017-08-30 DIAGNOSIS — Z01818 Encounter for other preprocedural examination: Secondary | ICD-10-CM

## 2017-08-30 DIAGNOSIS — E785 Hyperlipidemia, unspecified: Secondary | ICD-10-CM | POA: Insufficient documentation

## 2017-08-30 DIAGNOSIS — M797 Fibromyalgia: Secondary | ICD-10-CM | POA: Insufficient documentation

## 2017-08-30 DIAGNOSIS — I129 Hypertensive chronic kidney disease with stage 1 through stage 4 chronic kidney disease, or unspecified chronic kidney disease: Secondary | ICD-10-CM | POA: Diagnosis not present

## 2017-08-30 DIAGNOSIS — J449 Chronic obstructive pulmonary disease, unspecified: Secondary | ICD-10-CM | POA: Insufficient documentation

## 2017-08-30 DIAGNOSIS — K219 Gastro-esophageal reflux disease without esophagitis: Secondary | ICD-10-CM | POA: Diagnosis not present

## 2017-08-30 DIAGNOSIS — Z7982 Long term (current) use of aspirin: Secondary | ICD-10-CM | POA: Diagnosis not present

## 2017-08-30 DIAGNOSIS — K7581 Nonalcoholic steatohepatitis (NASH): Secondary | ICD-10-CM | POA: Diagnosis not present

## 2017-08-30 DIAGNOSIS — Z882 Allergy status to sulfonamides status: Secondary | ICD-10-CM | POA: Insufficient documentation

## 2017-08-30 DIAGNOSIS — Z79899 Other long term (current) drug therapy: Secondary | ICD-10-CM | POA: Insufficient documentation

## 2017-08-30 DIAGNOSIS — I10 Essential (primary) hypertension: Secondary | ICD-10-CM | POA: Insufficient documentation

## 2017-08-30 DIAGNOSIS — E1122 Type 2 diabetes mellitus with diabetic chronic kidney disease: Secondary | ICD-10-CM | POA: Diagnosis not present

## 2017-08-30 DIAGNOSIS — K7460 Unspecified cirrhosis of liver: Secondary | ICD-10-CM | POA: Diagnosis not present

## 2017-08-30 DIAGNOSIS — E559 Vitamin D deficiency, unspecified: Secondary | ICD-10-CM | POA: Diagnosis not present

## 2017-08-30 DIAGNOSIS — C220 Liver cell carcinoma: Secondary | ICD-10-CM | POA: Diagnosis present

## 2017-08-30 DIAGNOSIS — Z885 Allergy status to narcotic agent status: Secondary | ICD-10-CM | POA: Insufficient documentation

## 2017-08-30 DIAGNOSIS — E119 Type 2 diabetes mellitus without complications: Secondary | ICD-10-CM | POA: Diagnosis not present

## 2017-08-30 DIAGNOSIS — N183 Chronic kidney disease, stage 3 (moderate): Secondary | ICD-10-CM | POA: Diagnosis not present

## 2017-08-30 HISTORY — PX: RADIOFREQUENCY ABLATION: SHX2290

## 2017-08-30 LAB — CBC WITH DIFFERENTIAL/PLATELET
BASOS ABS: 0 10*3/uL (ref 0.0–0.1)
Basophils Relative: 0 %
EOS ABS: 0.1 10*3/uL (ref 0.0–0.7)
EOS PCT: 1 %
HCT: 32.2 % — ABNORMAL LOW (ref 36.0–46.0)
Hemoglobin: 10.4 g/dL — ABNORMAL LOW (ref 12.0–15.0)
Lymphocytes Relative: 21 %
Lymphs Abs: 1.1 10*3/uL (ref 0.7–4.0)
MCH: 33.5 pg (ref 26.0–34.0)
MCHC: 32.3 g/dL (ref 30.0–36.0)
MCV: 103.9 fL — ABNORMAL HIGH (ref 78.0–100.0)
Monocytes Absolute: 0.3 10*3/uL (ref 0.1–1.0)
Monocytes Relative: 6 %
Neutro Abs: 3.6 10*3/uL (ref 1.7–7.7)
Neutrophils Relative %: 72 %
Platelets: 80 10*3/uL — ABNORMAL LOW (ref 150–400)
RBC: 3.1 MIL/uL — AB (ref 3.87–5.11)
RDW: 15.8 % — ABNORMAL HIGH (ref 11.5–15.5)
WBC: 5.1 10*3/uL (ref 4.0–10.5)

## 2017-08-30 LAB — TYPE AND SCREEN
ABO/RH(D): B POS
ANTIBODY SCREEN: NEGATIVE

## 2017-08-30 LAB — GLUCOSE, CAPILLARY
GLUCOSE-CAPILLARY: 84 mg/dL (ref 65–99)
GLUCOSE-CAPILLARY: 100 mg/dL — AB (ref 65–99)

## 2017-08-30 SURGERY — RADIO FREQUENCY ABLATION
Anesthesia: General

## 2017-08-30 MED ORDER — GABAPENTIN 300 MG PO CAPS
600.0000 mg | ORAL_CAPSULE | Freq: Three times a day (TID) | ORAL | Status: DC
  Administered 2017-08-30 – 2017-08-31 (×2): 600 mg via ORAL
  Filled 2017-08-30 (×3): qty 2

## 2017-08-30 MED ORDER — IOPAMIDOL (ISOVUE-370) INJECTION 76%
50.0000 mL | Freq: Once | INTRAVENOUS | Status: AC | PRN
  Administered 2017-08-30: 50 mL via INTRAVENOUS

## 2017-08-30 MED ORDER — MAGNESIUM 200 MG PO TABS
250.0000 mg | ORAL_TABLET | Freq: Every day | ORAL | Status: DC
  Filled 2017-08-30: qty 2

## 2017-08-30 MED ORDER — IOPAMIDOL (ISOVUE-370) INJECTION 76%
INTRAVENOUS | Status: AC
  Filled 2017-08-30: qty 100

## 2017-08-30 MED ORDER — FERROUS SULFATE 325 (65 FE) MG PO TABS
325.0000 mg | ORAL_TABLET | Freq: Every day | ORAL | Status: DC
  Administered 2017-08-30 – 2017-08-31 (×2): 325 mg via ORAL
  Filled 2017-08-30 (×2): qty 1

## 2017-08-30 MED ORDER — MAGNESIUM OXIDE 400 (241.3 MG) MG PO TABS
400.0000 mg | ORAL_TABLET | Freq: Every day | ORAL | Status: DC
  Filled 2017-08-30: qty 1

## 2017-08-30 MED ORDER — VITAMIN D 1000 UNITS PO TABS
5000.0000 [IU] | ORAL_TABLET | Freq: Every day | ORAL | Status: DC
  Administered 2017-08-31: 5000 [IU] via ORAL
  Filled 2017-08-30: qty 5

## 2017-08-30 MED ORDER — FUROSEMIDE 10 MG/ML IJ SOLN
INTRAMUSCULAR | Status: AC
  Filled 2017-08-30: qty 2

## 2017-08-30 MED ORDER — FUROSEMIDE 40 MG PO TABS
80.0000 mg | ORAL_TABLET | Freq: Two times a day (BID) | ORAL | Status: DC
  Administered 2017-08-31: 80 mg via ORAL
  Filled 2017-08-30: qty 2

## 2017-08-30 MED ORDER — METOCLOPRAMIDE HCL 5 MG/ML IJ SOLN: 10.0000 mg | Freq: Once | INTRAMUSCULAR | Status: DC | PRN

## 2017-08-30 MED ORDER — PANTOPRAZOLE SODIUM 40 MG PO TBEC
40.0000 mg | DELAYED_RELEASE_TABLET | Freq: Every day | ORAL | Status: DC
  Administered 2017-08-30: 40 mg via ORAL
  Filled 2017-08-30 (×2): qty 1

## 2017-08-30 MED ORDER — POTASSIUM CHLORIDE CRYS ER 10 MEQ PO TBCR
10.0000 meq | EXTENDED_RELEASE_TABLET | Freq: Three times a day (TID) | ORAL | Status: DC
  Administered 2017-08-30 – 2017-08-31 (×2): 10 meq via ORAL
  Filled 2017-08-30 (×2): qty 1

## 2017-08-30 MED ORDER — CYCLOBENZAPRINE HCL 10 MG PO TABS: 10.0000 mg | ORAL_TABLET | Freq: Three times a day (TID) | ORAL | Status: DC | PRN

## 2017-08-30 MED ORDER — LIDOCAINE 2% (20 MG/ML) 5 ML SYRINGE
INTRAMUSCULAR | Status: DC | PRN
  Administered 2017-08-30: 100 mg via INTRAVENOUS

## 2017-08-30 MED ORDER — SUCCINYLCHOLINE CHLORIDE 200 MG/10ML IV SOSY
PREFILLED_SYRINGE | INTRAVENOUS | Status: DC | PRN
  Administered 2017-08-30: 100 mg via INTRAVENOUS

## 2017-08-30 MED ORDER — HYDROMORPHONE HCL 1 MG/ML IJ SOLN
0.5000 mg | INTRAMUSCULAR | Status: DC | PRN
  Administered 2017-08-30 – 2017-08-31 (×3): 0.5 mg via INTRAVENOUS
  Filled 2017-08-30 (×3): qty 1

## 2017-08-30 MED ORDER — BIOTENE DRY MOUTH MT LIQD: 15.0000 mL | OROMUCOSAL | Status: DC | PRN

## 2017-08-30 MED ORDER — KETOROLAC TROMETHAMINE 30 MG/ML IJ SOLN
INTRAMUSCULAR | Status: DC | PRN
  Administered 2017-08-30: 30 mg via INTRAMUSCULAR

## 2017-08-30 MED ORDER — ALLOPURINOL 300 MG PO TABS
150.0000 mg | ORAL_TABLET | Freq: Two times a day (BID) | ORAL | Status: DC
  Administered 2017-08-30 – 2017-08-31 (×2): 150 mg via ORAL
  Filled 2017-08-30 (×2): qty 1

## 2017-08-30 MED ORDER — IPRATROPIUM BROMIDE 0.02 % IN SOLN: 0.2500 mg | Freq: Four times a day (QID) | RESPIRATORY_TRACT | Status: DC

## 2017-08-30 MED ORDER — METOLAZONE 2.5 MG PO TABS
2.5000 mg | ORAL_TABLET | Freq: Once | ORAL | Status: AC
  Administered 2017-08-30: 2.5 mg via ORAL
  Filled 2017-08-30: qty 1

## 2017-08-30 MED ORDER — PROCHLORPERAZINE EDISYLATE 5 MG/ML IJ SOLN
10.0000 mg | INTRAMUSCULAR | Status: DC | PRN
  Administered 2017-08-30: 10 mg via INTRAVENOUS
  Filled 2017-08-30: qty 2

## 2017-08-30 MED ORDER — RIFAMPIN 300 MG PO CAPS
300.0000 mg | ORAL_CAPSULE | Freq: Two times a day (BID) | ORAL | Status: DC
  Administered 2017-08-30 – 2017-08-31 (×2): 300 mg via ORAL
  Filled 2017-08-30 (×2): qty 1

## 2017-08-30 MED ORDER — FENTANYL CITRATE (PF) 100 MCG/2ML IJ SOLN: 25.0000 ug | INTRAMUSCULAR | Status: DC | PRN

## 2017-08-30 MED ORDER — KETOROLAC TROMETHAMINE 30 MG/ML IJ SOLN: 30.0000 mg | Freq: Once | INTRAMUSCULAR | Status: DC | PRN

## 2017-08-30 MED ORDER — SODIUM CHLORIDE 0.9 % IV SOLN
INTRAVENOUS | Status: DC
  Administered 2017-08-30: 19:00:00 via INTRAVENOUS

## 2017-08-30 MED ORDER — MEPERIDINE HCL 50 MG/ML IJ SOLN: 6.2500 mg | INTRAMUSCULAR | Status: DC | PRN

## 2017-08-30 MED ORDER — IBUPROFEN 200 MG PO TABS: 400.0000 mg | ORAL_TABLET | ORAL | Status: DC | PRN

## 2017-08-30 MED ORDER — CITALOPRAM HYDROBROMIDE 20 MG PO TABS
40.0000 mg | ORAL_TABLET | Freq: Every day | ORAL | Status: DC
  Administered 2017-08-31: 40 mg via ORAL
  Filled 2017-08-30: qty 2

## 2017-08-30 MED ORDER — BISOPROLOL FUMARATE 5 MG PO TABS
10.0000 mg | ORAL_TABLET | Freq: Every day | ORAL | Status: DC
  Administered 2017-08-31: 10 mg via ORAL
  Filled 2017-08-30: qty 2

## 2017-08-30 MED ORDER — ROCURONIUM BROMIDE 50 MG/5ML IV SOSY
PREFILLED_SYRINGE | INTRAVENOUS | Status: DC | PRN
  Administered 2017-08-30: 50 mg via INTRAVENOUS

## 2017-08-30 MED ORDER — DEXAMETHASONE SODIUM PHOSPHATE 4 MG/ML IJ SOLN
INTRAMUSCULAR | Status: DC | PRN
  Administered 2017-08-30: 10 mg via INTRAVENOUS

## 2017-08-30 MED ORDER — PROPOFOL 10 MG/ML IV BOLUS
INTRAVENOUS | Status: DC | PRN
  Administered 2017-08-30: 30 mg via INTRAVENOUS
  Administered 2017-08-30: 120 mg via INTRAVENOUS

## 2017-08-30 MED ORDER — FUROSEMIDE 10 MG/ML IJ SOLN
INTRAMUSCULAR | Status: DC | PRN
  Administered 2017-08-30: 20 mg via INTRAMUSCULAR

## 2017-08-30 MED ORDER — FENTANYL CITRATE (PF) 250 MCG/5ML IJ SOLN
INTRAMUSCULAR | Status: AC
  Filled 2017-08-30: qty 5

## 2017-08-30 MED ORDER — PIPERACILLIN-TAZOBACTAM 3.375 G IVPB
3.3750 g | Freq: Once | INTRAVENOUS | Status: AC
Start: 2017-08-30 — End: 2017-08-30
  Administered 2017-08-30: 3.375 g via INTRAVENOUS
  Filled 2017-08-30: qty 50

## 2017-08-30 MED ORDER — SUGAMMADEX SODIUM 200 MG/2ML IV SOLN
INTRAVENOUS | Status: DC | PRN
  Administered 2017-08-30: 200 mg via INTRAVENOUS

## 2017-08-30 MED ORDER — DOCUSATE SODIUM 100 MG PO CAPS
100.0000 mg | ORAL_CAPSULE | Freq: Two times a day (BID) | ORAL | Status: DC
  Administered 2017-08-30 – 2017-08-31 (×2): 100 mg via ORAL
  Filled 2017-08-30 (×2): qty 1

## 2017-08-30 MED ORDER — FENTANYL CITRATE (PF) 100 MCG/2ML IJ SOLN
INTRAMUSCULAR | Status: DC | PRN
  Administered 2017-08-30 (×4): 50 ug via INTRAVENOUS

## 2017-08-30 MED ORDER — LACTATED RINGERS IV SOLN
INTRAVENOUS | Status: DC
  Administered 2017-08-30: 07:00:00 via INTRAVENOUS

## 2017-08-30 MED ORDER — EPHEDRINE SULFATE-NACL 50-0.9 MG/10ML-% IV SOSY
PREFILLED_SYRINGE | INTRAVENOUS | Status: DC | PRN
  Administered 2017-08-30 (×4): 5 mg via INTRAVENOUS

## 2017-08-30 MED ORDER — ONDANSETRON HCL 4 MG/2ML IJ SOLN
INTRAMUSCULAR | Status: DC | PRN
  Administered 2017-08-30: 2 mg via INTRAVENOUS

## 2017-08-30 NOTE — Sedation Documentation (Signed)
Anesthesia in to sedate and monitor. 

## 2017-08-30 NOTE — Procedures (Signed)
  Procedure: CT guided microwave ablation of intrahepatic hepatocellular carcinoma x2   EBL:   minimal Complications:  none immediate  See full dictation in [employer].  Dillard Cannon MD Main # [phone] Pager  [phone]

## 2017-08-30 NOTE — Anesthesia Preprocedure Evaluation (Signed)
Anesthesia Evaluation  Patient identified by MRN, date of birth, ID band Patient awake    Reviewed: Allergy & Precautions, NPO status , Patient's Chart, lab work & pertinent test results, reviewed documented beta blocker date and time   Airway Mallampati: I       Dental no notable dental hx. (+) Teeth Intact   Pulmonary former smoker,    Pulmonary exam normal breath sounds clear to auscultation       Cardiovascular hypertension, Pt. on home beta blockers Normal cardiovascular exam Rhythm:Regular Rate:Normal     Neuro/Psych    GI/Hepatic GERD  Medicated,  Endo/Other  diabetes, Type 2  Renal/GU Renal InsufficiencyRenal disease     Musculoskeletal   Abdominal (+) + obese,   Peds  Hematology  (+) Blood dyscrasia, anemia ,   Anesthesia Other Findings   Reproductive/Obstetrics                             Anesthesia Physical Anesthesia Plan  ASA: II  Anesthesia Plan: General   Post-op Pain Management:    Induction: Intravenous  PONV Risk Score and Plan: 4 or greater and Ondansetron, Dexamethasone, Midazolam and Treatment may vary due to age or medical condition  Airway Management Planned: Oral ETT  Additional Equipment:   Intra-op Plan:   Post-operative Plan: Extubation in OR  Informed Consent: I have reviewed the patients History and Physical, chart, labs and discussed the procedure including the risks, benefits and alternatives for the proposed anesthesia with the patient or authorized representative who has indicated his/her understanding and acceptance.   Dental advisory given  Plan Discussed with: CRNA and Surgeon  Anesthesia Plan Comments:         Anesthesia Quick Evaluation

## 2017-08-30 NOTE — Progress Notes (Signed)
Called Scott Allred, [place] to evaluate her home medication list and order any medications that she may need postoperatively.  Scott Allred, PA to place orders for postop meds for floor post procedure.  Called [facility] [address] and spoke with Josph Macho, RN to notify the patient's nurse of these orders.

## 2017-08-30 NOTE — Transfer of Care (Signed)
Immediate Anesthesia Transfer of Care Note  Patient: [name]  Procedure(s) Performed: Procedure(s): CT MICROWAVE THERMAL ABLATION (N/A)  Patient Location: PACU  Anesthesia Type:General  Level of Consciousness: Patient easily awoken, sedated, comfortable, cooperative, following commands, responds to stimulation.   Airway & Oxygen Therapy: Patient spontaneously breathing, ventilating well, oxygen via simple oxygen mask.  Post-op Assessment: Report given to PACU RN, vital signs reviewed and stable, moving all extremities.   Post vital signs: Reviewed and stable.  Complications: No apparent anesthesia complications Last Vitals:  Vitals:   08/30/17 0645  BP: (!) 143/50  Pulse: 70  Resp: (!) 22  Temp: 36.7 C  SpO2: (!) 89%    Last Pain:  Vitals:   08/30/17 0645  TempSrc: Oral         Complications: No apparent anesthesia complications

## 2017-08-30 NOTE — Anesthesia Procedure Notes (Signed)
Procedure Name: Intubation Date/Time: 08/30/2017 8:55 AM Performed by: Deliah Boston, CRNA Pre-anesthesia Checklist: Patient identified, Emergency Drugs available, Suction available and Patient being monitored Patient Re-evaluated:Patient Re-evaluated prior to induction Oxygen Delivery Method: Circle system utilized Preoxygenation: Pre-oxygenation with 100% oxygen Induction Type: IV induction Ventilation: Two handed mask ventilation required and Oral airway inserted - appropriate to patient size Laryngoscope Size: Glidescope and 3 Grade View: Grade I Tube type: Oral Tube size: 7.0 mm Number of attempts: 1 Airway Equipment and Method: Oral airway,  Rigid stylet and Video-laryngoscopy Placement Confirmation: ETT inserted through vocal cords under direct vision,  positive ETCO2 and breath sounds checked- equal and bilateral Secured at: 21 cm Tube secured with: Tape Dental Injury: Teeth and Oropharynx as per pre-operative assessment  Difficulty Due To: Difficulty was unanticipated and Difficult Airway- due to reduced neck mobility

## 2017-08-30 NOTE — Progress Notes (Signed)
Pt doing well; eating dinner; denies sig abd pain, N/V or resp difficulties VSS;AF Puncture sites mid/RUQ abd clean and dry,NT; foley with golden yellow urine  A/P: s/p CT guided thermal ablation [place] x2 today; for overnight obs; check am labs; f/u with Dr. Vernard Gambles in [facility] in 3-4 weeks   [facility]

## 2017-08-30 NOTE — H&P (Signed)
Referring Physician(s): Feng,Y  Supervising Physician: Arne Cleveland  Patient Status:  [facility] OP TBA  Chief Complaint: Hepatocellular carcinoma   Subjective: Patient familiar to IR service from prior L1 kyphoplasty on 06/27/16 as well as left lobe liver lesion biopsy on 07/18/17.  She was seen in consultation by Dr. Vernard Gambles on 07/10/17 to discuss treatment options for multifocal hepatocellular carcinoma.  She was deemed an appropriate candidate for CT-guided RFA of segments 2 and 6 liver lesions and presents today for the procedure.  She currently denies fever, headache, chest pain, cough, back pain, vomiting or abnormal bleeding.  She does have chronic dyspnea, occasional nausea and some mild abdominal discomfort.  Additional history as below. Past Medical History:  Diagnosis Date  . Asthma   . Cancer [facility])    liver cancer   . COPD (chronic obstructive pulmonary disease) ([name])    x 3 years   . Depression   . Diabetes mellitus without complication ([place])    diet controlled  not on meds   . Edema    left leg   . Esophageal varices ([place])   . Fibromyalgia   . Gastritis   . GERD (gastroesophageal reflux disease)   . Hepatic cirrhosis ([place])   . Hepatic encephalopathy ([place])   . History of blood transfusion   . Hyperlipidemia   . Hypertension   . IBS (irritable bowel syndrome)   . Phlebitis    left leg x 2   . Pneumonia    hx of   . Splenomegaly   . Vitamin D deficiency    Past Surgical History:  Procedure Laterality Date  . ABDOMINAL HYSTERECTOMY    . BACK SURGERY    . CATARACT EXTRACTION, BILATERAL    . CHOLECYSTECTOMY    . ESOPHAGOGASTRODUODENOSCOPY  07/16/2012   Procedure: ESOPHAGOGASTRODUODENOSCOPY (EGD);  Surgeon: Inda Castle, MD;  Location: [facility];  Service: Endoscopy;  Laterality: N/A;  . GASTRIC VARICES BANDING  07/16/2012   Procedure: GASTRIC VARICES BANDING;  Surgeon: Inda Castle, MD;  Location: [facility];  Service: Endoscopy;  Laterality:  N/A;  . HIP ARTHROPLASTY Bilateral   . IR GENERIC HISTORICAL  06/25/2016   IR RADIOLOGIST EVAL & MGMT 06/25/2016 [facility]  . IR GENERIC HISTORICAL  06/27/2016   IR KYPHO LUMBAR INC FX REDUCE BONE BX UNI/BIL CANNULATION INC/IMAGING 06/27/2016 Luanne Bras, MD [facility]  . IR GENERIC HISTORICAL  07/17/2016   IR RADIOLOGIST EVAL & MGMT 07/17/2016 [facility]  . IR RADIOLOGIST EVAL & MGMT  07/10/2017  . NECK SURGERY    . SHOULDER SURGERY Right      Allergies: Atorvastatin; Diphenhydramine hcl (sleep); Hydrocodone-acetaminophen; Lopid [gemfibrozil]; Loratadine; Lorazepam; Simvastatin; Sulfamethoxazole; and Sulfonamide derivatives  Medications: Prior to Admission medications   Medication Sig Start Date End Date Taking? Authorizing Provider  albuterol (PROVENTIL HFA;VENTOLIN HFA) 108 (90 Base) MCG/ACT inhaler Inhale 2 puffs into the lungs every 6 (six) hours as needed for wheezing or shortness of breath. 05/27/16  Yes Ward, Kristen N, DO  albuterol (PROVENTIL) (2.5 MG/3ML) 0.083% nebulizer solution Take 2.5 mg by nebulization every 4 (four) hours as needed for wheezing or shortness of breath.  04/11/16  Yes [provider]  allopurinol (ZYLOPRIM) 300 MG tablet take 1/2-1 tablet by mouth daily for gout Patient taking differently: take 1/2-1 tablet by mouth daily for gout patient takes in the pm per husband 04/29/17  Yes Unk Pinto, MD  antiseptic oral rinse (BIOTENE) LIQD 15 mLs by Mouth Rinse route  as needed for dry mouth. 12/20/16  Yes Vicie Mutters, PA-C  aspirin EC 81 MG tablet Take 81 mg by mouth at bedtime.   Yes [provider]  bisoprolol (ZEBETA) 10 MG tablet Take 1 tablet (10 mg total) by mouth daily. Patient taking differently: Take 10 mg by mouth daily.  04/09/17  Yes Vicie Mutters, PA-C  Cholecalciferol (VITAMIN D3) 5000 units CAPS Take 5,000 Units by mouth daily with breakfast.   Yes [provider]  citalopram (CELEXA) 40 MG tablet  Take 1 tablet (40 mg total) by mouth 2 (two) times daily. Patient taking differently: Take 40 mg by mouth 2 (two) times daily.  06/18/17  Yes Vicie Mutters, PA-C  cyclobenzaprine (FLEXERIL) 10 MG tablet Take 1 tablet (10 mg total) by mouth 3 (three) times daily as needed for muscle spasms. 03/06/16  Yes Unk Pinto, MD  ferrous sulfate 325 (65 FE) MG tablet Take 325 mg by mouth daily.    Yes [provider]  furosemide (LASIX) 80 MG tablet Take 1 tablet (80 mg total) by mouth 2 (two) times daily. Take 80 mg in the morning and 40 mg in the evening 02/05/17  Yes Vicie Mutters, PA-C  gabapentin (NEURONTIN) 600 MG tablet Take 1/2 to 1 tablet 3 times a day for neuropathy pain in legs 06/24/17  Yes Vicie Mutters, PA-C  lactulose [facility]) 10 GM/15ML solution take 107ms TWICE DAILY 08/08/17  Yes MUnk Pinto MD  Magnesium 250 MG TABS Take 250 mg by mouth at bedtime.    Yes [provider]  metolazone (ZAROXOLYN) 5 MG tablet Take 2.5 mg by mouth. Every other day 10/09/16  Yes [provider]  NONFORMULARY OR COMPOUNDED ITEM SALICYLIC ACID ACID (PETROLATUM) 50% OINTMENT-APPLY TO AFFECTED AREA EVERY NIGHT AT BEDTIME   Yes [provider]  ondansetron (ZOFRAN) 4 MG tablet Take 1 tablet (4 mg total) by mouth every 6 (six) hours as needed for nausea or vomiting. 10/16/16  Yes CVicie Mutters PA-C  OXYGEN Inhale 2 L into the lungs continuous. 3 L with exertion   Yes [provider]  pantoprazole (PROTONIX) 40 MG tablet Take 1 tablet (40 mg total) by mouth daily. Patient taking differently: Take 40 mg by mouth daily.  04/22/17  Yes MUnk Pinto MD  potassium chloride (K-DUR) 10 MEQ tablet Take 3 tablets (30 mEq total) by mouth 3 (three) times daily. 08/09/17  Yes [provider]  potassium chloride (K-DUR,KLOR-CON) 10 MEQ tablet Take 3 tablets (30 mEq total) by mouth 3 (three) times daily. Patient taking differently: Take 10 mEq by mouth 3 (three)  times daily.  01/07/17  Yes CVicie Mutters PA-C  rifampin (RIFADIN) 300 MG capsule Take 1 capsule 2 x / day for Liver 07/03/17 01/31/18 Yes MUnk Pinto MD  terconazole (TERAZOL 7) 0.4 % vaginal cream Place 1 applicator vaginally at bedtime. 12/20/16  Yes CVicie Mutters PA-C  traMADol (ULTRAM) 50 MG tablet Take 50 mg by mouth every 8 hours as needed for pain 05/07/17  Yes CVicie Mutters PA-C  triamcinolone cream (KENALOG) 0.1 % Apply 1 application topically 3 (three) times daily as needed for pain 12/20/16  Yes CVicie Mutters PA-C  doxycycline (VIBRAMYCIN) 100 MG capsule Take 1 capsule twice daily with food Patient not taking: Reported on 07/10/2017 05/31/17   CVicie Mutters PA-C  glucose blood test strip Take sugars once daily 01/21/17   CVicie Mutters PA-C  ipratropium (ATROVENT) 0.02 % nebulizer solution use 1 vial in nebulizer EVERY 4 HOURS AS  NEEDED FOR WHEEZING OR SHORTNESS OF BREATH 75/15=5 08/09/17   Unk Pinto, MD  nystatin cream (MYCOSTATIN) Apply 1 application topically 2 (two) times daily. Patient not taking: Reported on 08/27/2017 12/20/16   Vicie Mutters, PA-C     Vital Signs: BP (!) 143/50   Pulse 70   Temp 98.1 F (36.7 C) (Oral)   Resp (!) 22   Ht 5' 2.25" (1.581 m)   Wt 182 lb (82.6 kg)   SpO2 (!) 89%   BMI 33.02 kg/m   Physical Exam patient awake, alert.  Chest with clear but distant breath sounds bilaterally.  Heart with regular rate and rhythm ,soft murmur.  Abdomen slightly distended, positive bowel sounds, mild generalized tenderness; bilateral lower extremity edema, chronic venous stasis changes noted  Imaging: No results found.  Labs:  CBC: Recent Labs    07/10/17 1223 07/18/17 1135 08/27/17 1457 08/30/17 0716  WBC 4.5 4.9 4.7 5.1  HGB 10.4* 11.5* 10.9* 10.4*  HCT 32.5* 35.8* 34.4* 32.2*  PLT 75* 90* 81* 80*    COAGS: Recent Labs    05/31/17 1300 07/10/17 1223 07/18/17 1135 08/27/17 1457  INR 1.0 1.00* 1.10 1.10  APTT  --    --  32 34    BMP: Recent Labs    02/26/17 1602 05/01/17 1129 05/31/17 1157 05/31/17 1257 07/10/17 1223 08/27/17 1457  NA 138 141 142 140 138 137  K 3.3* 3.5 4.6 4.5 4.1 4.5  CL 92* 97* 100 98  --  96*  CO2 35* 35* 35* 39* 35* 33*  GLUCOSE 209* 113* 109* 103* 92 131*  BUN 17 22 22 23  13.2 14  CALCIUM 8.9 9.0 8.8 9.1 9.0 8.9  CREATININE 1.18* 1.43* 1.54* 1.38* 1.0 1.11*  GFRNONAA 47* 37* 34*  --   --  49*  GFRAA 54* 43* 39*  --   --  57*    LIVER FUNCTION TESTS: Recent Labs    02/26/17 1602 05/31/17 1157 05/31/17 1257 07/10/17 1223 08/27/17 1457  BILITOT 1.1 1.0 1.0 0.99 1.1  AST 43* 30 30 32 34  ALT 29 20 18 18 22   ALKPHOS 167*  --  118* 117 118  PROT 5.4* 5.4* 5.7* 5.9* 5.8*  ALBUMIN 3.4*  --  3.5 3.1* 3.3*    Assessment and Plan: Patient with history of oxygen dependent COPD, NASH cirrhosis, esophageal varices, portal hypertension, splenomegaly, ascites, and multifocal hepatocellular carcinoma.  She was seen in consultation by Dr. Vernard Gambles on 07/10/17 and deemed an appropriate candidate was CT-guided radiofrequency/thermal ablation of HCC's in segments 2 and 6.  She presents today for the procedure.  Details/risks of procedure, including but not limited to, internal bleeding, infection, injury to adjacent structures, anesthesia related complications discussed with patient and husband with their understanding and consent.  Post procedure she will be admitted to the hospital for overnight observation.This procedure involves the use of X-rays and because of the nature of the planned procedure, it is possible that we will have prolonged use of CT.  Potential radiation risks to you include (but are not limited to) the following: - A slightly elevated risk for cancer  several years later in life. This risk is typically less than 0.5% percent. This risk is low in comparison to the normal incidence of human cancer, which is 33% for women and 50% for men according to the [employer]. - Radiation induced injury can include skin redness, resembling a rash, tissue breakdown / ulcers and hair loss (which  can be temporary or permanent).   The likelihood of either of these occurring depends on the difficulty of the procedure and whether you are sensitive to radiation due to previous procedures, disease, or genetic conditions.   IF your procedure requires a prolonged use of radiation, you will be notified and given written instructions for further action.  It is your responsibility to monitor the irradiated area for the 2 weeks following the procedure and to notify your physician if you are concerned that you have suffered a radiation induced injury.      Electronically Signed: D. Rowe Robert, PA-C 08/30/2017, 8:07 AM   I spent a total of 30 minutes at the the patient's bedside AND on the patient's hospital floor or unit, greater than 50% of which was counseling/coordinating care for CT-guided radiofrequency/thermal ablation of hepatocellular carcinomas

## 2017-08-30 NOTE — Anesthesia Postprocedure Evaluation (Signed)
Anesthesia Post Note  Patient: [name]  Procedure(s) Performed: CT MICROWAVE THERMAL ABLATION (N/A )     Patient location during evaluation: PACU Anesthesia Type: General Level of consciousness: awake Pain management: pain level controlled Vital Signs Assessment: post-procedure vital signs reviewed and stable Respiratory status: spontaneous breathing Cardiovascular status: stable Postop Assessment: no apparent nausea or vomiting Anesthetic complications: no    Last Vitals:  Vitals:   08/30/17 1345 08/30/17 1400  BP: 139/63 140/60  Pulse: 88 84  Resp: (!) 9   Temp:    SpO2: 96% 95%    Last Pain:  Vitals:   08/30/17 1245  TempSrc:   PainSc: 0-No pain   Pain Goal:                 Shereena Berquist JR,JOHN Nasha Diss

## 2017-08-31 ENCOUNTER — Other Ambulatory Visit: Payer: Self-pay | Admitting: Radiology

## 2017-08-31 DIAGNOSIS — C220 Liver cell carcinoma: Secondary | ICD-10-CM | POA: Diagnosis not present

## 2017-08-31 LAB — COMPREHENSIVE METABOLIC PANEL
ALT: 93 U/L — ABNORMAL HIGH (ref 14–54)
ANION GAP: 10 (ref 5–15)
AST: 199 U/L — ABNORMAL HIGH (ref 15–41)
Albumin: 2.6 g/dL — ABNORMAL LOW (ref 3.5–5.0)
Alkaline Phosphatase: 101 U/L (ref 38–126)
BUN: 30 mg/dL — ABNORMAL HIGH (ref 6–20)
CHLORIDE: 98 mmol/L — AB (ref 101–111)
CO2: 29 mmol/L (ref 22–32)
Calcium: 8.2 mg/dL — ABNORMAL LOW (ref 8.9–10.3)
Creatinine, Ser: 1.19 mg/dL — ABNORMAL HIGH (ref 0.44–1.00)
GFR, EST AFRICAN AMERICAN: 52 mL/min — AB (ref >60)
GFR, EST NON AFRICAN AMERICAN: 45 mL/min — AB (ref >60)
Glucose, Bld: 101 mg/dL — ABNORMAL HIGH (ref 65–99)
POTASSIUM: 3.6 mmol/L (ref 3.5–5.1)
SODIUM: 137 mmol/L (ref 135–145)
Total Bilirubin: 2.4 mg/dL — ABNORMAL HIGH (ref 0.3–1.2)
Total Protein: 4.6 g/dL — ABNORMAL LOW (ref 6.5–8.1)

## 2017-08-31 NOTE — Discharge Instructions (Signed)
Thermal ablation, Care After This sheet gives you information about how to care for yourself after your procedure. Your health care provider may also give you more specific instructions. If you have problems or questions, contact your health care provider. What can I expect after the procedure? After the procedure, it is common to have:  Soreness around the treatment area.  Mild pain and swelling in the treatment area.  Follow these instructions at home: Treatment area care   Follow instructions from your health care provider about how to take care of your incision. Make sure you: ? Wash your hands with soap and water before you change your bandage (dressing). If soap and water are not available, use hand sanitizer. ? Change your dressing as told by your health care provider. ? Leave stitches (sutures) in place. They may need to stay in place for 2 weeks or longer.  Check your treatment area every day for signs of infection. Check for: ? More redness, swelling, or pain. ? More fluid or blood. ? Warmth. ? Pus or a bad smell.  Keep the treated area clean, dry, and covered with a dressing until it has healed. Clean the area with soap and water or as told by your health care provider.  You may shower if your health care provider approves. If your bandage gets wet, change it right away. Activity  Follow instructions from your health care provider about any activity limitations.  Do not drive for 24 hours if you received a medicine to help you relax (sedative). General instructions  Take over-the-counter and prescription medicines only as told by your health care provider.  Keep all follow-up visits as told by your health care provider. This is important. Contact a health care provider if:  You do not have a bowel movement for 2 days.  You have nausea or vomiting.  You have more redness, swelling, or pain around your treatment area.  You have more fluid or blood coming from  your treatment area.  Your treatment area feels warm to the touch.  You have pus or a bad smell coming from your treatment area.  You have a fever. Get help right away if:  You have severe pain.  You have trouble swallowing or breathing.  You have severe weakness or dizziness.  You have chest pain or shortness of breath. This information is not intended to replace advice given to you by your health care provider. Make sure you discuss any questions you have with your health care provider. Document Released: 05/13/2013 Document Revised: 02/10/2016 Document Reviewed: 12/21/2015 Elsevier Interactive Patient Education  [employer].

## 2017-08-31 NOTE — Progress Notes (Signed)
Discharge Planning- Chart reviewed. Spoke to pt's husband and she has neb machine and RW at home. Jonnie Finner RN CCM Case Mgmt phone [phone]

## 2017-08-31 NOTE — Discharge Summary (Signed)
Patient ID: [name] MRN: [id number] DOB/AGE: May 18, 1946 72 y.o.  Admit date: 08/30/2017 Discharge date: 08/31/2017  Supervising Physician: [name]  Patient Status: [facility] - In-pt  Admission Diagnoses: Hepatocellular Carcinoma  Discharge Diagnoses:  Active Problems:   Cancer of liver ([place])   Hepatocellular carcinoma [facility])   Discharged Condition: good  Hospital Course: Admitted to floor on 1/25 in stable condition after CT guided thermal ablation of 2 separate HCC lesions. See full procedure note. No immediate complications. Pt doing well on POD#1. Voiding well after foley removed. Reports minimal pain. No N/V, has tolerated regular diet and been OOB. Labs reviewed, slight rise in AST/ALT as expected, no shock liver. Stable for discharge. All medication, instructions, and follow up plans reviewed.   Consults: None   Discharge Exam: Blood pressure (!) 124/39, pulse 77, temperature 98.5 F (36.9 C), temperature source Oral, resp. rate 14, height 5' 2"  (1.575 m), weight 186 lb 15.2 oz (84.8 kg), SpO2 97 %. General: NAD Lungs: CTA without w/r/r Heart: Regular Abdomen: soft, NT. All puncture sites clean, NT, no erythema    Disposition: 01-Home or Self Care  Discharge Instructions    Call MD for:  persistant nausea and vomiting   Complete by:  As directed    Call MD for:  redness, tenderness, or signs of infection (pain, swelling, redness, odor or green/yellow discharge around incision site)   Complete by:  As directed    Call MD for:  severe uncontrolled pain   Complete by:  As directed    Call MD for:  temperature >100.4   Complete by:  As directed    Diet - low sodium heart healthy   Complete by:  As directed    Driving Restrictions   Complete by:  As directed    Avoid driving for a few days.   Increase activity slowly   Complete by:  As directed      Allergies as of 08/31/2017      Reactions   Atorvastatin Other (See Comments)   Doesn't  remember reaction   Diphenhydramine Hcl (sleep) Hives   Hydrocodone-acetaminophen Other (See Comments)   Reaction not remembered   Lopid [gemfibrozil] Other (See Comments)   Doesn't remember reaction   Loratadine Hives   Lorazepam Hives   Simvastatin Other (See Comments)   Reaction not remembered   Sulfamethoxazole Hives   Sulfonamide Derivatives Hives      Medication List    TAKE these medications   albuterol (2.5 MG/3ML) 0.083% nebulizer solution Commonly known as:  PROVENTIL Take 2.5 mg by nebulization every 4 (four) hours as needed for wheezing or shortness of breath.   albuterol 108 (90 Base) MCG/ACT inhaler Commonly known as:  PROVENTIL HFA;VENTOLIN HFA Inhale 2 puffs into the lungs every 6 (six) hours as needed for wheezing or shortness of breath.   allopurinol 300 MG tablet Commonly known as:  ZYLOPRIM take 1/2-1 tablet by mouth daily for gout What changed:  See the new instructions.   antiseptic oral rinse Liqd 15 mLs by Mouth Rinse route as needed for dry mouth.   aspirin EC 81 MG tablet Take 81 mg by mouth at bedtime.   bisoprolol 10 MG tablet Commonly known as:  ZEBETA Take 1 tablet (10 mg total) by mouth daily.   citalopram 40 MG tablet Commonly known as:  CELEXA Take 1 tablet (40 mg total) by mouth 2 (two) times daily.   cyclobenzaprine 10 MG tablet Commonly known as:  FLEXERIL Take  1 tablet (10 mg total) by mouth 3 (three) times daily as needed for muscle spasms.   doxycycline 100 MG capsule Commonly known as:  VIBRAMYCIN Take 1 capsule twice daily with food   ferrous sulfate 325 (65 FE) MG tablet Take 325 mg by mouth daily.   furosemide 80 MG tablet Commonly known as:  LASIX Take 1 tablet (80 mg total) by mouth 2 (two) times daily. Take 80 mg in the morning and 40 mg in the evening   gabapentin 600 MG tablet Commonly known as:  NEURONTIN Take 1/2 to 1 tablet 3 times a day for neuropathy pain in legs   glucose blood test strip Take sugars  once daily   ipratropium 0.02 % nebulizer solution Commonly known as:  ATROVENT use 1 vial in nebulizer EVERY 4 HOURS AS NEEDED FOR WHEEZING OR SHORTNESS OF BREATH 75/15=5   lactulose 10 GM/15ML solution Commonly known as:  CHRONULAC take 38ms TWICE DAILY   Magnesium 250 MG Tabs Take 250 mg by mouth at bedtime.   metolazone 5 MG tablet Commonly known as:  ZAROXOLYN Take 2.5 mg by mouth. Every other day   NONFORMULARY OR COMPOUNDED ITEM SALICYLIC ACID ACID (PETROLATUM) 50% OINTMENT-APPLY TO AFFECTED AREA EVERY NIGHT AT BEDTIME   nystatin cream Commonly known as:  MYCOSTATIN Apply 1 application topically 2 (two) times daily.   ondansetron 4 MG tablet Commonly known as:  ZOFRAN Take 1 tablet (4 mg total) by mouth every 6 (six) hours as needed for nausea or vomiting.   OXYGEN Inhale 2 L into the lungs continuous. 3 L with exertion   pantoprazole 40 MG tablet Commonly known as:  PROTONIX Take 1 tablet (40 mg total) by mouth daily.   potassium chloride 10 MEQ tablet Commonly known as:  K-DUR,KLOR-CON Take 3 tablets (30 mEq total) by mouth 3 (three) times daily. What changed:  how much to take   rifampin 300 MG capsule Commonly known as:  RIFADIN Take 1 capsule 2 x / day for Liver   terconazole 0.4 % vaginal cream Commonly known as:  TERAZOL 7 Place 1 applicator vaginally at bedtime.   traMADol 50 MG tablet Commonly known as:  ULTRAM Take 50 mg by mouth every 8 hours as needed for pain   triamcinolone cream 0.1 % Commonly known as:  KENALOG Apply 1 application topically 3 (three) times daily as needed for pain   Vitamin D3 5000 units Caps Take 5,000 Units by mouth daily with breakfast.      Follow-up Information    HArne Cleveland MD. Go in 3 week(s).   Specialties:  Interventional Radiology, Radiology Why:  Office will contact you to schedule this appointment Contact information: [phone]             Electronically Signed: BAscencion Dike PA-C 08/31/2017, 9:07 AM   I have spent Less Than 30 Minutes discharging FCollie Siad

## 2017-08-31 NOTE — Progress Notes (Signed)
Discharge instructions reviewed with patient and husband utilizing teach back method no questions at this time. Patient discharged to home

## 2017-09-02 ENCOUNTER — Other Ambulatory Visit: Payer: Self-pay

## 2017-09-02 ENCOUNTER — Encounter (HOSPITAL_COMMUNITY): Payer: Self-pay | Admitting: Interventional Radiology

## 2017-09-02 MED ORDER — ONDANSETRON HCL 4 MG PO TABS: 4.0000 mg | ORAL_TABLET | Freq: Four times a day (QID) | ORAL | 1 refills | Status: AC | PRN

## 2017-09-03 ENCOUNTER — Encounter: Payer: Self-pay | Admitting: Internal Medicine

## 2017-09-03 ENCOUNTER — Ambulatory Visit (INDEPENDENT_AMBULATORY_CARE_PROVIDER_SITE_OTHER): Payer: PPO | Admitting: Internal Medicine

## 2017-09-03 VITALS — BP 110/66 | HR 64 | Temp 97.3°F | Resp 18 | Ht 62.0 in | Wt 187.0 lb

## 2017-09-03 DIAGNOSIS — I10 Essential (primary) hypertension: Secondary | ICD-10-CM | POA: Diagnosis not present

## 2017-09-03 DIAGNOSIS — R300 Dysuria: Secondary | ICD-10-CM | POA: Diagnosis not present

## 2017-09-03 DIAGNOSIS — E782 Mixed hyperlipidemia: Secondary | ICD-10-CM | POA: Diagnosis not present

## 2017-09-03 DIAGNOSIS — E559 Vitamin D deficiency, unspecified: Secondary | ICD-10-CM | POA: Diagnosis not present

## 2017-09-03 DIAGNOSIS — J42 Unspecified chronic bronchitis: Secondary | ICD-10-CM | POA: Diagnosis not present

## 2017-09-03 DIAGNOSIS — Z79899 Other long term (current) drug therapy: Secondary | ICD-10-CM | POA: Diagnosis not present

## 2017-09-03 DIAGNOSIS — E1122 Type 2 diabetes mellitus with diabetic chronic kidney disease: Secondary | ICD-10-CM | POA: Diagnosis not present

## 2017-09-03 DIAGNOSIS — E7220 Disorder of urea cycle metabolism, unspecified: Secondary | ICD-10-CM

## 2017-09-03 DIAGNOSIS — R188 Other ascites: Secondary | ICD-10-CM

## 2017-09-03 DIAGNOSIS — K7460 Unspecified cirrhosis of liver: Secondary | ICD-10-CM | POA: Diagnosis not present

## 2017-09-03 DIAGNOSIS — J9611 Chronic respiratory failure with hypoxia: Secondary | ICD-10-CM | POA: Diagnosis not present

## 2017-09-03 DIAGNOSIS — M109 Gout, unspecified: Secondary | ICD-10-CM | POA: Diagnosis not present

## 2017-09-03 DIAGNOSIS — R7303 Prediabetes: Secondary | ICD-10-CM | POA: Diagnosis not present

## 2017-09-03 NOTE — Progress Notes (Signed)
This unfortunate 72 yo MWF with NASH associated End Stage Cirrhosis/portal HTN/varices, Cirrhosis and End Stage O2 dependent COPD.  Patient has had a several year prodrome of progressive decline consequent of her several severel co-morbidities.  Recurrent hepatic encephalopathy seemingly has improved after adding maintenance Rifampin ($40/month)  allowing her husband to taper her lactulose w/o exacerbation of alertness. (Patient was unable to afford the $1,600/ co-pay for Xifaxin (Rifaximin)  ($2,300/month). Patient was referred by Dr Havery Moros to Dr Burr Medico in December for CT abd scan suspect for 2 separate primary hepatomas confirmed by biopsy and patient subsequently underwent CT guided  microwave ablation of the cancers by Dr Vernard Gambles on 08/30/2016.      Patient is also followed for hx/o  Hypertension, Hyperlipidemia, Pre-Diabetes and Vitamin D Deficiency.      Patient is treated for HTN & BP has been controlled at home. Today's BP at goal - 110/66. Patient has had no complaints of any cardiac type chest pain, palpitations, dyspnea / orthopnea / PND, dizziness, claudication, or dependent edema. Patient has hx/o Gout w/o recent sx's.      Hyperlipidemia is controlled with diet. Last Lipids were  Lab Results  Component Value Date   CHOL 177 05/31/2017   HDL 53 05/31/2017   LDLCALC 93 01/21/2017   TRIG 130 05/31/2017   CHOLHDL 3.3 05/31/2017      Also, the patient has history of  Prediabetes and Insulin Resistance and has had no symptoms of reactive hypoglycemia, diabetic polys, paresthesias or visual blurring.  Last A1c was Normal & at goal: Lab Results  Component Value Date   HGBA1C 5.1 08/27/2017      Further, the patient also has history of Vitamin D Deficiency and supplements vitamin D without any suspected side-effects. Last vitamin D was at goal: Lab Results  Component Value Date   VD25OH 89 05/31/2017   Current Outpatient Medications on File Prior to Visit  Medication Sig  .  albuterol (PROVENTIL HFA;VENTOLIN HFA) 108 (90 Base) MCG/ACT inhaler Inhale 2 puffs into the lungs every 6 (six) hours as needed for wheezing or shortness of breath.  Marland Kitchen albuterol (PROVENTIL) (2.5 MG/3ML) 0.083% nebulizer solution Take 2.5 mg by nebulization every 4 (four) hours as needed for wheezing or shortness of breath.   . allopurinol (ZYLOPRIM) 300 MG tablet take 1/2-1 tablet by mouth daily for gout (Patient taking differently: takes 1 tab by mouth daily)  . antiseptic oral rinse (BIOTENE) LIQD 15 mLs by Mouth Rinse route as needed for dry mouth.  Marland Kitchen aspirin EC 81 MG tablet Take 81 mg by mouth at bedtime.  . bisoprolol (ZEBETA) 10 MG tablet Take 1 tablet (10 mg total) by mouth daily. (Patient taking differently: Take 10 mg by mouth daily. )  . Cholecalciferol (VITAMIN D3) 5000 units CAPS Take 5,000 Units by mouth daily with breakfast.  . citalopram (CELEXA) 40 MG tablet Take 1 tablet (40 mg total) by mouth 2 (two) times daily. (Patient taking differently: Take 40 mg by mouth 2 (two) times daily. )  . cyclobenzaprine (FLEXERIL) 10 MG tablet Take 1 tablet (10 mg total) by mouth 3 (three) times daily as needed for muscle spasms.  . ferrous sulfate 325 (65 FE) MG tablet Take 325 mg by mouth daily.   . furosemide (LASIX) 80 MG tablet Take 1 tablet (80 mg total) by mouth 2 (two) times daily. Take 80 mg in the morning and 40 mg in the evening  . gabapentin (NEURONTIN) 600  MG tablet Take 1/2 to 1 tablet 3 times a day for neuropathy pain in legs  . glucose blood test strip Take sugars once daily  . ipratropium (ATROVENT) 0.02 % nebulizer solution use 1 vial in nebulizer EVERY 4 HOURS AS NEEDED FOR WHEEZING OR SHORTNESS OF BREATH 75/15=5  . lactulose (CHRONULAC) 10 GM/15ML solution take 15ms TWICE DAILY  . Magnesium 250 MG TABS Take 250 mg by mouth at bedtime.   . metolazone (ZAROXOLYN) 5 MG tablet Take 2.5 mg by mouth. Every other day  . NONFORMULARY OR COMPOUNDED ITEM SALICYLIC ACID ACID (PETROLATUM)  50% OINTMENT-APPLY TO AFFECTED AREA EVERY NIGHT AT BEDTIME  . nystatin cream (MYCOSTATIN) Apply 1 application topically 2 (two) times daily.  . ondansetron (ZOFRAN) 4 MG tablet Take 1 tablet (4 mg total) by mouth every 6 (six) hours as needed for nausea or vomiting.  . OXYGEN Inhale 2 L into the lungs continuous. 3 L with exertion  . pantoprazole (PROTONIX) 40 MG tablet Take 1 tablet (40 mg total) by mouth daily.  . potassium chloride (K-DUR,KLOR-CON) 10 MEQ tablet Take 3 tablets (30 mEq total) by mouth 3 (three) times daily. (Patient taking differently: Take 10 mEq by mouth 3 (three) times daily. )  . rifampin (RIFADIN) 300 MG capsule Take 1 capsule 2 x / day for Liver  . terconazole (TERAZOL 7) 0.4 % vaginal cream Place 1 applicator vaginally at bedtime.  . traMADol (ULTRAM) 50 MG tablet Take 50 mg by mouth every 8 hours as needed for pain  . triamcinolone cream (KENALOG) 0.1 % Apply 1 application topically 3 (three) times daily as needed for pain   No current facility-administered medications on file prior to visit.    Allergies  Allergen Reactions  . Atorvastatin Other (See Comments)    Doesn't remember reaction  . Diphenhydramine Hcl (Sleep) Hives  . Hydrocodone-Acetaminophen Other (See Comments)    Reaction not remembered  . Lopid [Gemfibrozil] Other (See Comments)    Doesn't remember reaction  . Loratadine Hives  . Lorazepam Hives  . Simvastatin Other (See Comments)    Reaction not remembered  . Sulfamethoxazole Hives  . Sulfonamide Derivatives Hives   PMHx:   Past Medical History:  Diagnosis Date  . Asthma   . Cancer ([facility]    liver cancer   . COPD (chronic obstructive pulmonary disease) ([place]    x 3 years   . Depression   . Diabetes mellitus without complication ([place]    diet controlled  not on meds   . Edema    left leg   . Esophageal varices ([place]   . Fibromyalgia   . Gastritis   . GERD (gastroesophageal reflux disease)   . Hepatic cirrhosis ([place]   . Hepatic  encephalopathy ([place]   . History of blood transfusion   . Hyperlipidemia   . Hypertension   . IBS (irritable bowel syndrome)   . Phlebitis    left leg x 2   . Pneumonia    hx of   . Splenomegaly   . Vitamin D deficiency    Immunization History  Administered Date(s) Administered  . Hep A / Hep B 11/05/2013, 11/12/2013, 12/07/2013, 12/31/2014  . Hepatitis B, adult 12/19/2015  . Hepatitis B, ped/adol 11/17/2015, 05/21/2016  . Influenza Split 05/26/2013, 06/17/2014  . Influenza, High Dose Seasonal PF 06/15/2015, 05/01/2017  . Influenza,inj,quad, With Preservative 05/22/2016  . Pneumococcal Conjugate-13 07/22/2015  . Pneumococcal Polysaccharide-23 08/28/2013  . Tdap 11/19/2012   Past Surgical History:  Procedure Laterality Date  . ABDOMINAL HYSTERECTOMY    . BACK SURGERY    . CATARACT EXTRACTION, BILATERAL    . CHOLECYSTECTOMY    . ESOPHAGOGASTRODUODENOSCOPY  07/16/2012   Procedure: ESOPHAGOGASTRODUODENOSCOPY (EGD);  Surgeon: Inda Castle, MD;  Location: [facility];  Service: Endoscopy;  Laterality: N/A;  . GASTRIC VARICES BANDING  07/16/2012   Procedure: GASTRIC VARICES BANDING;  Surgeon: Inda Castle, MD;  Location: [facility];  Service: Endoscopy;  Laterality: N/A;  . HIP ARTHROPLASTY Bilateral   . IR GENERIC HISTORICAL  06/25/2016   IR RADIOLOGIST EVAL & MGMT 06/25/2016 [facility]  . IR GENERIC HISTORICAL  06/27/2016   IR KYPHO LUMBAR INC FX REDUCE BONE BX UNI/BIL CANNULATION INC/IMAGING 06/27/2016 Luanne Bras, MD [facility]  . IR GENERIC HISTORICAL  07/17/2016   IR RADIOLOGIST EVAL & MGMT 07/17/2016 [facility]  . IR RADIOLOGIST EVAL & MGMT  07/10/2017  . NECK SURGERY    . RADIOFREQUENCY ABLATION N/A 08/30/2017   Procedure: CT MICROWAVE THERMAL ABLATION;  Surgeon: Arne Cleveland, MD;  Location: [facility];  Service: Anesthesiology;  Laterality: N/A;  . SHOULDER SURGERY Right    FHx:    Reviewed / unchanged  SHx:    Reviewed / unchanged   Systems  Review:  Constitutional: Denies fever, chills, wt changes, headaches, insomnia, fatigue, night sweats, change in appetite. Eyes: Denies redness, blurred vision, diplopia, discharge, itchy, watery eyes.  ENT: Denies discharge, congestion, post nasal drip, epistaxis, sore throat, earache, hearing loss, dental pain, tinnitus, vertigo, sinus pain, snoring.  CV: Denies chest pain, palpitations, irregular heartbeat, syncope, dyspnea, diaphoresis, orthopnea, PND, claudication or edema. Respiratory: denies cough, dyspnea, DOE, pleurisy, hoarseness, laryngitis, wheezing.  Gastrointestinal: Denies dysphagia, odynophagia, heartburn, reflux, water brash, abdominal pain or cramps, nausea, vomiting, bloating, constipation, hematemesis, melena, hematochezia  or hemorrhoids. Genitourinary: Denies dysuria, frequency, urgency, nocturia, hesitancy, discharge, hematuria or flank pain. Musculoskeletal: Denies arthralgias, myalgias, stiffness, jt. swelling, pain, limping or strain/sprain.  Skin: Denies pruritus, rash, hives, warts, acne, eczema or change in skin lesion(s). Neuro: No  tremor, incoordination, spasms, paresthesia or pain. Psychiatric: Denies recent confusion, memory loss or sensory loss. Endo: Denies change in weight, skin or hair change.  Heme/Lymph: No excessive bleeding, bruising or enlarged lymph nodes.  Physical Exam  BP 110/66   Pulse 64   Temp (!) 97.3 F (36.3 C)   Resp 18   Ht 5' 2"  (1.575 m)   Wt 187 lb (84.8 kg) Comment: patient weighs daily at home  BMI 34.20 kg/m   Appears chronically ill  and in no distress.  Eyes: PERRLA, EOMs, conjunctiva no swelling or erythema. Sinuses: No frontal/maxillary tenderness ENT/Mouth: EAC's clear, TM's nl w/o erythema, bulging. Nares clear w/o erythema, swelling, exudates. Oropharynx clear without erythema or exudates. Oral hygiene is good. Tongue normal, non obstructing. Hearing intact.  Neck: Supple. Thyroid nl. Car 2+/2+ without bruits, nodes  or JVD. Chest: Respirations nl with BS clear & equal w/o rales, rhonchi, wheezing or stridor.  Cor: Heart sounds normal w/ regular rate and rhythm without sig. murmurs, gallops, clicks or rubs. Peripheral pulses normal and equal  without edema.  Abdomen: distended & tense & bowel sounds decreased. Non-tender w/o guarding. Lymphatics: Unremarkable.  Musculoskeletal: Full ROM all peripheral extremities, joint stability, 5/5 strength and normal gait.  Skin: Warm, dry without exposed rashes, lesions or ecchymosis apparent.  Neuro: Cranial nerves intact, reflexes equal bilaterally. Sensory-motor testing grossly intact. Tendon reflexes grossly intact. No Asterixis.  Pysch: Alert & oriented  x 3.  Flat depressed affect.   Assessment and Plan:  1. Essential hypertension  - Continue medication, monitor blood pressure at home.  - Continue DASH diet. Reminder to go to the ER if any CP,  SOB, nausea, dizziness, severe HA, changes vision/speech. - CBC with Differential/Platelet - BASIC METABOLIC PANEL WITH GFR - Magnesium - TSH  2. Hyperlipidemia, mixed  - Continue diet, exercise,& lifestyle modifications.  - Continue monitor periodic cholesterol/liver & renal functions  - Hepatic function panel - TSH  3. PreDiabetes  (HCC)  - Continue diet, exercise, lifestyle modifications.  - Monitor appropriate labs.  - Hemoglobin A1c  4. Vitamin D deficiency  - Continue supplementation.  - VITAMIN D 25 Hydroxy   5. Cirrhosis of liver with ascites HCC)  - Hepatic function panel - Ammonia  - Ambulatory referral to Gastroenterology  6. Hyperammonemia (HCC)  - Ammonia  - Ambulatory referral to Gastroenterology for management of ? worsening ascites  7. Chronic bronchitis ([place])  8. Chronic respiratory failure with hypoxia (HCC)  9. Medication management  - CBC with Differential/Platelet - BASIC METABOLIC PANEL WITH GFR - Hepatic function panel - Magnesium - TSH - Hemoglobin A1c -  VITAMIN D 25 Hydroxy  - Ammonia  10. Gout  - Uric acid  11. Dysuria  - Urinalysis, Routine w reflex microscopic        Discussed  regular exercise, BP monitoring, weight control to achieve/maintain BMI less than 25 and discussed med and SE's. Recommended labs to assess and monitor clinical status with further disposition pending results of labs. Over 30 minutes of exam, counseling, chart review was performed.

## 2017-09-03 NOTE — Patient Instructions (Signed)

## 2017-09-04 ENCOUNTER — Other Ambulatory Visit: Payer: Self-pay

## 2017-09-04 ENCOUNTER — Other Ambulatory Visit: Payer: Self-pay | Admitting: Internal Medicine

## 2017-09-04 DIAGNOSIS — N390 Urinary tract infection, site not specified: Secondary | ICD-10-CM

## 2017-09-04 DIAGNOSIS — D649 Anemia, unspecified: Secondary | ICD-10-CM

## 2017-09-04 LAB — BASIC METABOLIC PANEL WITH GFR
BUN / CREAT RATIO: 21 (calc) (ref 6–22)
BUN: 23 mg/dL (ref 7–25)
CHLORIDE: 103 mmol/L (ref 98–110)
CO2: 29 mmol/L (ref 20–32)
Calcium: 8.3 mg/dL — ABNORMAL LOW (ref 8.6–10.4)
Creat: 1.1 mg/dL — ABNORMAL HIGH (ref 0.60–0.93)
GFR, EST AFRICAN AMERICAN: 59 mL/min/{1.73_m2} — AB (ref >=60)
GFR, Est Non African American: 51 mL/min/{1.73_m2} — ABNORMAL LOW (ref >=60)
Glucose, Bld: 108 mg/dL — ABNORMAL HIGH (ref 65–99)
Potassium: 5 mmol/L (ref 3.5–5.3)
Sodium: 136 mmol/L (ref 135–146)

## 2017-09-04 LAB — CBC WITH DIFFERENTIAL/PLATELET
BASOS ABS: 31 {cells}/uL (ref 0–200)
Basophils Relative: 0.4 %
EOS ABS: 62 {cells}/uL (ref 15–500)
Eosinophils Relative: 0.8 %
HCT: 28.1 % — ABNORMAL LOW (ref 35.0–45.0)
Hemoglobin: 9.5 g/dL — ABNORMAL LOW (ref 11.7–15.5)
Lymphs Abs: 601 cells/uL — ABNORMAL LOW (ref 850–3900)
MCH: 33.3 pg — AB (ref 27.0–33.0)
MCHC: 33.8 g/dL (ref 32.0–36.0)
MCV: 98.6 fL (ref 80.0–100.0)
MONOS PCT: 7.1 %
MPV: 12.7 fL — ABNORMAL HIGH (ref 7.5–12.5)
Neutro Abs: 6552 cells/uL (ref 1500–7800)
Neutrophils Relative %: 84 %
PLATELETS: 71 10*3/uL — AB (ref 140–400)
RBC: 2.85 10*6/uL — ABNORMAL LOW (ref 3.80–5.10)
RDW: 14.2 % (ref 11.0–15.0)
TOTAL LYMPHOCYTE: 7.7 %
WBC mixed population: 554 cells/uL (ref 200–950)
WBC: 7.8 10*3/uL (ref 3.8–10.8)

## 2017-09-04 LAB — HEPATIC FUNCTION PANEL
AG RATIO: 1.5 (calc) (ref 1.0–2.5)
ALT: 121 U/L — ABNORMAL HIGH (ref 6–29)
AST: 66 U/L — ABNORMAL HIGH (ref 10–35)
Albumin: 2.9 g/dL — ABNORMAL LOW (ref 3.6–5.1)
Alkaline phosphatase (APISO): 141 U/L — ABNORMAL HIGH (ref 33–130)
BILIRUBIN INDIRECT: 0.8 mg/dL (ref 0.2–1.2)
Bilirubin, Direct: 1 mg/dL — ABNORMAL HIGH (ref 0.0–0.2)
GLOBULIN: 1.9 g/dL (ref 1.9–3.7)
Total Bilirubin: 1.8 mg/dL — ABNORMAL HIGH (ref 0.2–1.2)
Total Protein: 4.8 g/dL — ABNORMAL LOW (ref 6.1–8.1)

## 2017-09-04 LAB — URINALYSIS, ROUTINE W REFLEX MICROSCOPIC
BILIRUBIN URINE: NEGATIVE
GLUCOSE, UA: NEGATIVE
Hgb urine dipstick: NEGATIVE
Hyaline Cast: NONE SEEN /LPF
Ketones, ur: NEGATIVE
NITRITE: POSITIVE — AB
PH: 5.5 (ref 5.0–8.0)
Protein, ur: NEGATIVE
SPECIFIC GRAVITY, URINE: 1.016 (ref 1.001–1.03)

## 2017-09-04 LAB — HEMOGLOBIN A1C
Hgb A1c MFr Bld: 5.1 % of total Hgb (ref <5.7)
Mean Plasma Glucose: 100 (calc)
eAG (mmol/L): 5.5 (calc)

## 2017-09-04 LAB — TSH: TSH: 0.82 mIU/L (ref 0.40–4.50)

## 2017-09-04 LAB — MAGNESIUM: MAGNESIUM: 2 mg/dL (ref 1.5–2.5)

## 2017-09-04 LAB — VITAMIN D 25 HYDROXY (VIT D DEFICIENCY, FRACTURES): VIT D 25 HYDROXY: 59 ng/mL (ref 30–100)

## 2017-09-04 LAB — URIC ACID: Uric Acid, Serum: 5.8 mg/dL (ref 2.5–7.0)

## 2017-09-04 LAB — AMMONIA: AMMONIA: 67 umol/L (ref <=72)

## 2017-09-04 MED ORDER — CIPROFLOXACIN HCL 250 MG PO TABS: ORAL_TABLET | ORAL | 0 refills | Status: DC

## 2017-09-04 MED ORDER — METOLAZONE 5 MG PO TABS: 5.0000 mg | ORAL_TABLET | ORAL | 0 refills | Status: DC

## 2017-09-07 ENCOUNTER — Emergency Department (HOSPITAL_COMMUNITY): Payer: PPO

## 2017-09-07 ENCOUNTER — Inpatient Hospital Stay (HOSPITAL_COMMUNITY)
Admission: EM | Admit: 2017-09-07 | Discharge: 2017-09-13 | DRG: 291 | Disposition: A | Discharge status: Home / Self Care | Payer: PPO | Attending: Internal Medicine | Admitting: Internal Medicine

## 2017-09-07 ENCOUNTER — Encounter (HOSPITAL_COMMUNITY): Payer: Self-pay | Admitting: Emergency Medicine

## 2017-09-07 DIAGNOSIS — F329 Major depressive disorder, single episode, unspecified: Secondary | ICD-10-CM | POA: Diagnosis present

## 2017-09-07 DIAGNOSIS — J441 Chronic obstructive pulmonary disease with (acute) exacerbation: Secondary | ICD-10-CM | POA: Diagnosis not present

## 2017-09-07 DIAGNOSIS — E8809 Other disorders of plasma-protein metabolism, not elsewhere classified: Secondary | ICD-10-CM | POA: Diagnosis present

## 2017-09-07 DIAGNOSIS — I5031 Acute diastolic (congestive) heart failure: Secondary | ICD-10-CM | POA: Clinically undetermined

## 2017-09-07 DIAGNOSIS — K7581 Nonalcoholic steatohepatitis (NASH): Secondary | ICD-10-CM | POA: Diagnosis present

## 2017-09-07 DIAGNOSIS — E1122 Type 2 diabetes mellitus with diabetic chronic kidney disease: Secondary | ICD-10-CM | POA: Diagnosis not present

## 2017-09-07 DIAGNOSIS — I5033 Acute on chronic diastolic (congestive) heart failure: Secondary | ICD-10-CM | POA: Diagnosis present

## 2017-09-07 DIAGNOSIS — R188 Other ascites: Secondary | ICD-10-CM | POA: Diagnosis not present

## 2017-09-07 DIAGNOSIS — R601 Generalized edema: Secondary | ICD-10-CM | POA: Diagnosis not present

## 2017-09-07 DIAGNOSIS — N281 Cyst of kidney, acquired: Secondary | ICD-10-CM | POA: Diagnosis present

## 2017-09-07 DIAGNOSIS — C220 Liver cell carcinoma: Secondary | ICD-10-CM | POA: Diagnosis not present

## 2017-09-07 DIAGNOSIS — R0602 Shortness of breath: Secondary | ICD-10-CM | POA: Diagnosis not present

## 2017-09-07 DIAGNOSIS — K7290 Hepatic failure, unspecified without coma: Secondary | ICD-10-CM | POA: Diagnosis present

## 2017-09-07 DIAGNOSIS — E1169 Type 2 diabetes mellitus with other specified complication: Secondary | ICD-10-CM | POA: Diagnosis not present

## 2017-09-07 DIAGNOSIS — L03115 Cellulitis of right lower limb: Secondary | ICD-10-CM | POA: Diagnosis not present

## 2017-09-07 DIAGNOSIS — Z9889 Other specified postprocedural states: Secondary | ICD-10-CM | POA: Diagnosis not present

## 2017-09-07 DIAGNOSIS — J918 Pleural effusion in other conditions classified elsewhere: Secondary | ICD-10-CM | POA: Diagnosis not present

## 2017-09-07 DIAGNOSIS — R0600 Dyspnea, unspecified: Secondary | ICD-10-CM

## 2017-09-07 DIAGNOSIS — R1084 Generalized abdominal pain: Secondary | ICD-10-CM | POA: Diagnosis not present

## 2017-09-07 DIAGNOSIS — R109 Unspecified abdominal pain: Secondary | ICD-10-CM

## 2017-09-07 DIAGNOSIS — E872 Acidosis: Secondary | ICD-10-CM | POA: Diagnosis not present

## 2017-09-07 DIAGNOSIS — N135 Crossing vessel and stricture of ureter without hydronephrosis: Secondary | ICD-10-CM | POA: Diagnosis present

## 2017-09-07 DIAGNOSIS — J9811 Atelectasis: Secondary | ICD-10-CM | POA: Diagnosis not present

## 2017-09-07 DIAGNOSIS — R52 Pain, unspecified: Secondary | ICD-10-CM | POA: Diagnosis not present

## 2017-09-07 DIAGNOSIS — R5381 Other malaise: Secondary | ICD-10-CM | POA: Diagnosis not present

## 2017-09-07 DIAGNOSIS — J181 Lobar pneumonia, unspecified organism: Secondary | ICD-10-CM | POA: Diagnosis present

## 2017-09-07 DIAGNOSIS — J9621 Acute and chronic respiratory failure with hypoxia: Secondary | ICD-10-CM

## 2017-09-07 DIAGNOSIS — E876 Hypokalemia: Secondary | ICD-10-CM | POA: Diagnosis present

## 2017-09-07 DIAGNOSIS — R079 Chest pain, unspecified: Secondary | ICD-10-CM | POA: Diagnosis not present

## 2017-09-07 DIAGNOSIS — Z886 Allergy status to analgesic agent status: Secondary | ICD-10-CM

## 2017-09-07 DIAGNOSIS — K652 Spontaneous bacterial peritonitis: Secondary | ICD-10-CM | POA: Diagnosis present

## 2017-09-07 DIAGNOSIS — L03116 Cellulitis of left lower limb: Secondary | ICD-10-CM | POA: Diagnosis not present

## 2017-09-07 DIAGNOSIS — R846 Abnormal cytological findings in specimens from respiratory organs and thorax: Secondary | ICD-10-CM | POA: Diagnosis not present

## 2017-09-07 DIAGNOSIS — K219 Gastro-esophageal reflux disease without esophagitis: Secondary | ICD-10-CM | POA: Diagnosis present

## 2017-09-07 DIAGNOSIS — J440 Chronic obstructive pulmonary disease with acute lower respiratory infection: Secondary | ICD-10-CM | POA: Diagnosis present

## 2017-09-07 DIAGNOSIS — E662 Morbid (severe) obesity with alveolar hypoventilation: Secondary | ICD-10-CM | POA: Diagnosis not present

## 2017-09-07 DIAGNOSIS — I872 Venous insufficiency (chronic) (peripheral): Secondary | ICD-10-CM | POA: Diagnosis not present

## 2017-09-07 DIAGNOSIS — E8770 Fluid overload, unspecified: Secondary | ICD-10-CM | POA: Diagnosis not present

## 2017-09-07 DIAGNOSIS — R402414 Glasgow coma scale score 13-15, 24 hours or more after hospital admission: Secondary | ICD-10-CM | POA: Diagnosis not present

## 2017-09-07 DIAGNOSIS — J449 Chronic obstructive pulmonary disease, unspecified: Secondary | ICD-10-CM | POA: Diagnosis not present

## 2017-09-07 DIAGNOSIS — N183 Chronic kidney disease, stage 3 (moderate): Secondary | ICD-10-CM | POA: Diagnosis present

## 2017-09-07 DIAGNOSIS — E559 Vitamin D deficiency, unspecified: Secondary | ICD-10-CM | POA: Diagnosis present

## 2017-09-07 DIAGNOSIS — R140 Abdominal distension (gaseous): Secondary | ICD-10-CM | POA: Diagnosis not present

## 2017-09-07 DIAGNOSIS — J90 Pleural effusion, not elsewhere classified: Secondary | ICD-10-CM | POA: Clinically undetermined

## 2017-09-07 DIAGNOSIS — Z882 Allergy status to sulfonamides status: Secondary | ICD-10-CM

## 2017-09-07 DIAGNOSIS — Z9981 Dependence on supplemental oxygen: Secondary | ICD-10-CM | POA: Diagnosis not present

## 2017-09-07 DIAGNOSIS — J9622 Acute and chronic respiratory failure with hypercapnia: Secondary | ICD-10-CM | POA: Diagnosis not present

## 2017-09-07 DIAGNOSIS — K7460 Unspecified cirrhosis of liver: Secondary | ICD-10-CM | POA: Diagnosis not present

## 2017-09-07 DIAGNOSIS — Z66 Do not resuscitate: Secondary | ICD-10-CM | POA: Diagnosis present

## 2017-09-07 DIAGNOSIS — Z7982 Long term (current) use of aspirin: Secondary | ICD-10-CM

## 2017-09-07 DIAGNOSIS — Z9049 Acquired absence of other specified parts of digestive tract: Secondary | ICD-10-CM

## 2017-09-07 DIAGNOSIS — N200 Calculus of kidney: Secondary | ICD-10-CM | POA: Diagnosis present

## 2017-09-07 DIAGNOSIS — Z6833 Body mass index (BMI) 33.0-33.9, adult: Secondary | ICD-10-CM

## 2017-09-07 DIAGNOSIS — J9600 Acute respiratory failure, unspecified whether with hypoxia or hypercapnia: Secondary | ICD-10-CM | POA: Diagnosis not present

## 2017-09-07 DIAGNOSIS — Z96643 Presence of artificial hip joint, bilateral: Secondary | ICD-10-CM | POA: Diagnosis present

## 2017-09-07 DIAGNOSIS — M797 Fibromyalgia: Secondary | ICD-10-CM | POA: Diagnosis present

## 2017-09-07 DIAGNOSIS — K580 Irritable bowel syndrome with diarrhea: Secondary | ICD-10-CM | POA: Diagnosis present

## 2017-09-07 DIAGNOSIS — I10 Essential (primary) hypertension: Secondary | ICD-10-CM | POA: Diagnosis present

## 2017-09-07 DIAGNOSIS — Z87891 Personal history of nicotine dependence: Secondary | ICD-10-CM

## 2017-09-07 DIAGNOSIS — I130 Hypertensive heart and chronic kidney disease with heart failure and stage 1 through stage 4 chronic kidney disease, or unspecified chronic kidney disease: Principal | ICD-10-CM | POA: Diagnosis present

## 2017-09-07 DIAGNOSIS — I509 Heart failure, unspecified: Secondary | ICD-10-CM | POA: Diagnosis not present

## 2017-09-07 DIAGNOSIS — E785 Hyperlipidemia, unspecified: Secondary | ICD-10-CM | POA: Diagnosis present

## 2017-09-07 DIAGNOSIS — Z888 Allergy status to other drugs, medicaments and biological substances status: Secondary | ICD-10-CM

## 2017-09-07 DIAGNOSIS — Z8672 Personal history of thrombophlebitis: Secondary | ICD-10-CM

## 2017-09-07 DIAGNOSIS — Z79899 Other long term (current) drug therapy: Secondary | ICD-10-CM

## 2017-09-07 DIAGNOSIS — Z885 Allergy status to narcotic agent status: Secondary | ICD-10-CM

## 2017-09-07 LAB — CBC
HCT: 30.4 % — ABNORMAL LOW (ref 36.0–46.0)
HCT: 28 % — ABNORMAL LOW (ref 36.0–46.0)
Hemoglobin: 9.8 g/dL — ABNORMAL LOW (ref 12.0–15.0)
Hemoglobin: 9 g/dL — ABNORMAL LOW (ref 12.0–15.0)
MCH: 33.6 pg (ref 26.0–34.0)
MCH: 33.3 pg (ref 26.0–34.0)
MCHC: 32.2 g/dL (ref 30.0–36.0)
MCHC: 32.1 g/dL (ref 30.0–36.0)
MCV: 104.1 fL — ABNORMAL HIGH (ref 78.0–100.0)
MCV: 103.7 fL — ABNORMAL HIGH (ref 78.0–100.0)
PLATELETS: 124 10*3/uL — AB (ref 150–400)
Platelets: 110 10*3/uL — ABNORMAL LOW (ref 150–400)
RBC: 2.92 MIL/uL — ABNORMAL LOW (ref 3.87–5.11)
RBC: 2.7 MIL/uL — ABNORMAL LOW (ref 3.87–5.11)
RDW: 16 % — ABNORMAL HIGH (ref 11.5–15.5)
RDW: 16.2 % — ABNORMAL HIGH (ref 11.5–15.5)
WBC: 6.9 10*3/uL (ref 4.0–10.5)
WBC: 5.7 10*3/uL (ref 4.0–10.5)

## 2017-09-07 LAB — HEPATIC FUNCTION PANEL
ALBUMIN: 2.7 g/dL — AB (ref 3.5–5.0)
ALT: 51 U/L (ref 14–54)
AST: 30 U/L (ref 15–41)
Alkaline Phosphatase: 145 U/L — ABNORMAL HIGH (ref 38–126)
BILIRUBIN INDIRECT: 0.9 mg/dL (ref 0.3–0.9)
Bilirubin, Direct: 0.7 mg/dL — ABNORMAL HIGH (ref 0.1–0.5)
Total Bilirubin: 1.6 mg/dL — ABNORMAL HIGH (ref 0.3–1.2)
Total Protein: 5.3 g/dL — ABNORMAL LOW (ref 6.5–8.1)

## 2017-09-07 LAB — I-STAT TROPONIN, ED: TROPONIN I, POC: 0 ng/mL (ref 0.00–0.08)

## 2017-09-07 LAB — BASIC METABOLIC PANEL
Anion gap: 10 (ref 5–15)
BUN: 19 mg/dL (ref 6–20)
CALCIUM: 8.5 mg/dL — AB (ref 8.9–10.3)
CO2: 25 mmol/L (ref 22–32)
CREATININE: 1.26 mg/dL — AB (ref 0.44–1.00)
Chloride: 99 mmol/L — ABNORMAL LOW (ref 101–111)
GFR calc Af Amer: 49 mL/min — ABNORMAL LOW (ref >60)
GFR calc non Af Amer: 42 mL/min — ABNORMAL LOW (ref >60)
GLUCOSE: 165 mg/dL — AB (ref 65–99)
Potassium: 3.3 mmol/L — ABNORMAL LOW (ref 3.5–5.1)
Sodium: 134 mmol/L — ABNORMAL LOW (ref 135–145)

## 2017-09-07 LAB — ALBUMIN: Albumin: 2.5 g/dL — ABNORMAL LOW (ref 3.5–5.0)

## 2017-09-07 LAB — URINALYSIS, ROUTINE W REFLEX MICROSCOPIC
Bilirubin Urine: NEGATIVE
Glucose, UA: NEGATIVE mg/dL
Ketones, ur: NEGATIVE mg/dL
Leukocytes, UA: NEGATIVE
Nitrite: NEGATIVE
Protein, ur: NEGATIVE mg/dL
RBC / HPF: NONE SEEN RBC/hpf (ref 0–5)
Specific Gravity, Urine: 1.015 (ref 1.005–1.030)
pH: 6 (ref 5.0–8.0)

## 2017-09-07 LAB — PROTIME-INR
INR: 1.17
PROTHROMBIN TIME: 14.8 s (ref 11.4–15.2)

## 2017-09-07 LAB — AMMONIA: Ammonia: 18 umol/L (ref 9–35)

## 2017-09-07 LAB — MAGNESIUM: Magnesium: 2 mg/dL (ref 1.7–2.4)

## 2017-09-07 LAB — LIPASE, BLOOD: Lipase: 84 U/L — ABNORMAL HIGH (ref 11–51)

## 2017-09-07 LAB — PHOSPHORUS: Phosphorus: 3.7 mg/dL (ref 2.5–4.6)

## 2017-09-07 LAB — PROCALCITONIN: Procalcitonin: <0.1 ng/mL

## 2017-09-07 LAB — GLUCOSE, CAPILLARY: Glucose-Capillary: 102 mg/dL — ABNORMAL HIGH (ref 65–99)

## 2017-09-07 LAB — CREATININE, SERUM
Creatinine, Ser: 1.27 mg/dL — ABNORMAL HIGH (ref 0.44–1.00)
GFR calc Af Amer: 48 mL/min — ABNORMAL LOW (ref >60)
GFR calc non Af Amer: 42 mL/min — ABNORMAL LOW (ref >60)

## 2017-09-07 MED ORDER — MAGNESIUM 250 MG PO TABS: 250.0000 mg | ORAL_TABLET | Freq: Every day | ORAL | Status: DC

## 2017-09-07 MED ORDER — IPRATROPIUM-ALBUTEROL 0.5-2.5 (3) MG/3ML IN SOLN
3.0000 mL | Freq: Four times a day (QID) | RESPIRATORY_TRACT | Status: DC
  Administered 2017-09-07 – 2017-09-09 (×9): 3 mL via RESPIRATORY_TRACT
  Filled 2017-09-07 (×9): qty 3

## 2017-09-07 MED ORDER — VITAMIN D 1000 UNITS PO TABS
1000.0000 [IU] | ORAL_TABLET | Freq: Every day | ORAL | Status: DC
  Administered 2017-09-08 – 2017-09-13 (×6): 1000 [IU] via ORAL
  Filled 2017-09-07 (×7): qty 1

## 2017-09-07 MED ORDER — PANTOPRAZOLE SODIUM 40 MG PO TBEC
40.0000 mg | DELAYED_RELEASE_TABLET | Freq: Every day | ORAL | Status: DC
  Administered 2017-09-07 – 2017-09-13 (×7): 40 mg via ORAL
  Filled 2017-09-07 (×7): qty 1

## 2017-09-07 MED ORDER — POTASSIUM CHLORIDE CRYS ER 20 MEQ PO TBCR
40.0000 meq | EXTENDED_RELEASE_TABLET | Freq: Two times a day (BID) | ORAL | Status: AC
  Administered 2017-09-07 – 2017-09-09 (×4): 40 meq via ORAL
  Filled 2017-09-07 (×4): qty 2

## 2017-09-07 MED ORDER — BUDESONIDE 0.25 MG/2ML IN SUSP
0.2500 mg | Freq: Two times a day (BID) | RESPIRATORY_TRACT | Status: DC
  Administered 2017-09-07 – 2017-09-13 (×12): 0.25 mg via RESPIRATORY_TRACT
  Filled 2017-09-07 (×13): qty 2

## 2017-09-07 MED ORDER — IOPAMIDOL (ISOVUE-300) INJECTION 61%
INTRAVENOUS | Status: AC
  Administered 2017-09-07: 75 mL
  Filled 2017-09-07: qty 75

## 2017-09-07 MED ORDER — GABAPENTIN 600 MG PO TABS
300.0000 mg | ORAL_TABLET | Freq: Two times a day (BID) | ORAL | Status: DC
  Administered 2017-09-07 – 2017-09-13 (×12): 300 mg via ORAL
  Filled 2017-09-07 (×12): qty 1

## 2017-09-07 MED ORDER — FERROUS SULFATE 325 (65 FE) MG PO TABS
325.0000 mg | ORAL_TABLET | Freq: Every day | ORAL | Status: DC
  Administered 2017-09-08 – 2017-09-13 (×6): 325 mg via ORAL
  Filled 2017-09-07 (×6): qty 1

## 2017-09-07 MED ORDER — ASPIRIN EC 81 MG PO TBEC
81.0000 mg | DELAYED_RELEASE_TABLET | Freq: Every day | ORAL | Status: DC
  Administered 2017-09-07 – 2017-09-12 (×6): 81 mg via ORAL
  Filled 2017-09-07 (×6): qty 1

## 2017-09-07 MED ORDER — BISOPROLOL FUMARATE 5 MG PO TABS
10.0000 mg | ORAL_TABLET | Freq: Every day | ORAL | Status: DC
  Administered 2017-09-07 – 2017-09-13 (×7): 10 mg via ORAL
  Filled 2017-09-07 (×8): qty 2

## 2017-09-07 MED ORDER — HEPARIN SODIUM (PORCINE) 5000 UNIT/ML IJ SOLN
5000.0000 [IU] | Freq: Two times a day (BID) | INTRAMUSCULAR | Status: DC
  Administered 2017-09-07 – 2017-09-13 (×12): 5000 [IU] via SUBCUTANEOUS
  Filled 2017-09-07 (×12): qty 1

## 2017-09-07 MED ORDER — POTASSIUM CHLORIDE CRYS ER 20 MEQ PO TBCR
40.0000 meq | EXTENDED_RELEASE_TABLET | Freq: Once | ORAL | Status: AC
  Administered 2017-09-07: 40 meq via ORAL
  Filled 2017-09-07: qty 2

## 2017-09-07 MED ORDER — ALBUMIN HUMAN 25 % IV SOLN
25.0000 g | Freq: Once | INTRAVENOUS | Status: AC
  Administered 2017-09-07: 25 g via INTRAVENOUS
  Filled 2017-09-07: qty 100

## 2017-09-07 MED ORDER — VITAMIN D3 125 MCG (5000 UT) PO CAPS: 5000.0000 [IU] | ORAL_CAPSULE | Freq: Every day | ORAL | Status: DC

## 2017-09-07 MED ORDER — LACTULOSE 10 GM/15ML PO SOLN
10.0000 g | Freq: Every day | ORAL | Status: DC
  Administered 2017-09-08 – 2017-09-13 (×6): 10 g via ORAL
  Filled 2017-09-07 (×6): qty 15

## 2017-09-07 MED ORDER — ALBUMIN HUMAN 25 % IV SOLN
12.5000 g | Freq: Once | INTRAVENOUS | Status: DC
  Filled 2017-09-07: qty 50

## 2017-09-07 MED ORDER — FUROSEMIDE 10 MG/ML IJ SOLN
60.0000 mg | Freq: Two times a day (BID) | INTRAMUSCULAR | Status: DC
  Administered 2017-09-07 – 2017-09-10 (×6): 60 mg via INTRAVENOUS
  Filled 2017-09-07 (×6): qty 6

## 2017-09-07 MED ORDER — RIFAMPIN 300 MG PO CAPS
300.0000 mg | ORAL_CAPSULE | Freq: Two times a day (BID) | ORAL | Status: DC
  Administered 2017-09-07 – 2017-09-13 (×13): 300 mg via ORAL
  Filled 2017-09-07 (×15): qty 1

## 2017-09-07 MED ORDER — FENTANYL CITRATE (PF) 100 MCG/2ML IJ SOLN
50.0000 ug | Freq: Once | INTRAMUSCULAR | Status: AC
  Administered 2017-09-07: 50 ug via INTRAVENOUS
  Filled 2017-09-07: qty 2

## 2017-09-07 MED ORDER — CYCLOBENZAPRINE HCL 10 MG PO TABS
10.0000 mg | ORAL_TABLET | Freq: Three times a day (TID) | ORAL | Status: DC | PRN
  Administered 2017-09-08 – 2017-09-13 (×6): 10 mg via ORAL
  Filled 2017-09-07 (×6): qty 1

## 2017-09-07 MED ORDER — POTASSIUM CHLORIDE 10 MEQ/100ML IV SOLN
10.0000 meq | Freq: Once | INTRAVENOUS | Status: AC
  Administered 2017-09-07: 10 meq via INTRAVENOUS
  Filled 2017-09-07: qty 100

## 2017-09-07 MED ORDER — CITALOPRAM HYDROBROMIDE 40 MG PO TABS
40.0000 mg | ORAL_TABLET | Freq: Every day | ORAL | Status: DC
  Administered 2017-09-07 – 2017-09-13 (×7): 40 mg via ORAL
  Filled 2017-09-07 (×7): qty 1

## 2017-09-07 MED ORDER — ALLOPURINOL 300 MG PO TABS
300.0000 mg | ORAL_TABLET | Freq: Every day | ORAL | Status: DC
  Administered 2017-09-07 – 2017-09-12 (×6): 300 mg via ORAL
  Filled 2017-09-07 (×6): qty 1

## 2017-09-07 MED ORDER — CEFTRIAXONE SODIUM 2 G IJ SOLR
2.0000 g | INTRAMUSCULAR | Status: DC
Start: 2017-09-07 — End: 2017-09-11
  Administered 2017-09-07 – 2017-09-10 (×4): 2 g via INTRAVENOUS
  Filled 2017-09-07 (×4): qty 2

## 2017-09-07 NOTE — ED Notes (Signed)
Patient transported to CT 

## 2017-09-07 NOTE — ED Notes (Signed)
Pt transported to XRay 

## 2017-09-07 NOTE — ED Notes (Signed)
Pt back from X-Ray.  

## 2017-09-07 NOTE — ED Notes (Signed)
Pt. returned from XR. 

## 2017-09-07 NOTE — ED Notes (Signed)
ED Provider at bedside. 

## 2017-09-07 NOTE — ED Provider Notes (Addendum)
Patient had CT-guided ablation of liver lesions occurred last week.  Complains of diffuse abdominal pain and swelling onset 2 days ago accompanied by fever of 101 degrees.  Other associated symptoms include shortness of breath.  She feels like her swelling of her abdomen is impinging on her ability to breathe.  On exam patient is chronically ill-appearing lungs clear to auscultation abdomen is distended.  Bilateral upper extremities with trace edema.  Bilateral lower extremities with brawny chronic appearing skin changes, mildly edematous.     Orlie Dakin, MD 09/07/17 1553 Oral potassium supplementation ordered by me.  Of consulted hospitalist service who will arrange for overnight stay.  I am concerned about subacute bacterial peritonitis given ascites and tenderness in abdomen   Orlie Dakin, MD 09/07/17 1748 Patient may also need thoracentesis in light of pleural effusion and dyspnea. Chest x-ray viewed by me Orlie Dakin, MD 09/07/17 [id number]    Orlie Dakin, MD 09/07/17 1750

## 2017-09-07 NOTE — ED Notes (Signed)
Admitting at bedside 

## 2017-09-07 NOTE — ED Notes (Signed)
Pt placed on 4L [place] due to O2 sats being low

## 2017-09-07 NOTE — ED Provider Notes (Signed)
[facility] Provider Note   CSN: [id number] Arrival date & time: 09/07/17  1415   History   Chief Complaint Chief Complaint  Patient presents with  . Shortness of Breath  . abdominal distention    HPI [name] is a 72 y.o. female with PMHx of multifocal HCC, end stage NASH cirrhosis complicated by ascites and varices, COPD on 2 liter supplemental oxygen, and HTN.   Patient presenting with 2-3 days of shortness of breath and abdominal distention.    The history is provided by the patient and the spouse.  Shortness of Breath  This is a recurrent problem. The average episode lasts 3 days. The problem occurs continuously.The current episode started more than 2 days ago. The problem has been gradually worsening. Associated symptoms include sore throat (attributes to intubation), cough (Nonproductive ), chest pain and abdominal pain. Pertinent negatives include no fever, no sputum production and no vomiting. It is unknown what precipitated the problem. Associated medical issues include COPD and recent surgery.  The patient's SOB was not relieved by home breathing treatments.   The patient had CT guided microwave ablation of intrahepatic hepatocellular carcinoma x2 on 08/30/2017. She tolerated the procedure well with no immediate post operative complications. Patient also complains of increased abdominal distention and diffuse abdominal pain. She states that her abdominal distention is making it difficult for her to breath. It is also causing her pain with inspiration. She also complains of right sided "lung pain." Per patient's husband she has had no episodes of altered mental status, but has been experiencing nausea. She has been having low grade fevers at home around 100. Per patient's husband she typically runs a low grade fever daily. She has not had therapeutic or diagnostic paracentesis in the past.   Past Medical History:  Diagnosis Date  . Asthma   . Cancer  [facility])    liver cancer   . COPD (chronic obstructive pulmonary disease) ([place])    x 3 years   . Depression   . Diabetes mellitus without complication ([place])    diet controlled  not on meds   . Edema    left leg   . Esophageal varices ([place])   . Fibromyalgia   . Gastritis   . GERD (gastroesophageal reflux disease)   . Hepatic cirrhosis ([place])   . Hepatic encephalopathy ([place])   . History of blood transfusion   . Hyperlipidemia   . Hypertension   . IBS (irritable bowel syndrome)   . Phlebitis    left leg x 2   . Pneumonia    hx of   . Splenomegaly   . Vitamin D deficiency     Patient Active Problem List   Diagnosis Date Noted  . Ascites 09/07/2017  . Hepatocellular carcinoma ([place]) 08/30/2017  . Anasarca 12/14/2016  . COPD mixed type ([place]) 11/30/2016  . Obesity (BMI 30-39.9) 10/12/2016  . Hyperammonemia ([place]) 10/12/2016  . Atrial tachycardia, multifocal ([place])   . Chronic respiratory failure ([place]) 01/19/2016  . Hepatic encephalopathy ([place]) 09/21/2015  . Hypokalemia 09/21/2015  . Thrombocytopenia ([place]) 08/07/2015  . Venous insufficiency 07/26/2015  . Medication management 12/14/2013  . Essential hypertension   . Hyperlipidemia   . GERD   . Vitamin D deficiency   . IBS   . Fibromyalgia   . Esophageal varices ([place]) 07/08/2012  . COLONIC POLYPS 11/11/2008  . Anemia 05/28/2008  . Hepatic cirrhosis ([place]) 05/28/2008  . Esophagitis 05/27/2008  . Diverticulosis of large intestine  05/27/2008    Past Surgical History:  Procedure Laterality Date  . ABDOMINAL HYSTERECTOMY    . BACK SURGERY    . CATARACT EXTRACTION, BILATERAL    . CHOLECYSTECTOMY    . ESOPHAGOGASTRODUODENOSCOPY  07/16/2012   Procedure: ESOPHAGOGASTRODUODENOSCOPY (EGD);  Surgeon: Inda Castle, MD;  Location: [facility];  Service: Endoscopy;  Laterality: N/A;  . GASTRIC VARICES BANDING  07/16/2012   Procedure: GASTRIC VARICES BANDING;  Surgeon: Inda Castle, MD;  Location: [facility] END[facility];  Service:  Endoscopy;  Laterality: N/A;  . HIP ARTHROPLASTY Bilateral   . IR GENERIC HISTORICAL  06/25/2016   IR RADIOLOGIST EVAL & MGMT 06/25/2016 [facility]  . IR GENERIC HISTORICAL  06/27/2016   IR KYPHO LUMBAR INC FX REDUCE BONE BX UNI/BIL CANNULATION INC/IMAGING 06/27/2016 Luanne Bras, MD [facility]  . IR GENERIC HISTORICAL  07/17/2016   IR RADIOLOGIST EVAL & MGMT 07/17/2016 [facility]  . IR RADIOLOGIST EVAL & MGMT  07/10/2017  . NECK SURGERY    . RADIOFREQUENCY ABLATION N/A 08/30/2017   Procedure: CT MICROWAVE THERMAL ABLATION;  Surgeon: Arne Cleveland, MD;  Location: [facility] ORS;  Service: Anesthesiology;  Laterality: N/A;  . SHOULDER SURGERY Right     OB History    No data available       Home Medications    Prior to Admission medications   Medication Sig Start Date End Date Taking? Authorizing Provider  albuterol (PROVENTIL HFA;VENTOLIN HFA) 108 (90 Base) MCG/ACT inhaler Inhale 2 puffs into the lungs every 6 (six) hours as needed for wheezing or shortness of breath. 05/27/16  Yes Ward, Delice Bison, DO  allopurinol (ZYLOPRIM) 300 MG tablet take 1/2-1 tablet by mouth daily for gout Patient taking differently: take 1 tablet (300 mg) by mouth daily with supper - for gout 04/29/17  Yes Unk Pinto, MD  aspirin EC 81 MG tablet Take 81 mg by mouth at bedtime.   Yes [provider]  bisoprolol (ZEBETA) 10 MG tablet Take 1 tablet (10 mg total) by mouth daily. Patient taking differently: Take 10 mg by mouth daily with supper.  04/09/17  Yes Vicie Mutters, PA-C  Cholecalciferol (VITAMIN D3) 5000 units CAPS Take 5,000 Units by mouth daily with breakfast.   Yes [provider]  citalopram (CELEXA) 40 MG tablet Take 1 tablet (40 mg total) by mouth 2 (two) times daily. Patient taking differently: Take 40 mg by mouth daily with supper.  06/18/17  Yes Vicie Mutters, PA-C  cyclobenzaprine (FLEXERIL) 10 MG tablet Take 1 tablet (10 mg total) by mouth 3 (three) times daily  as needed for muscle spasms. 03/06/16  Yes Unk Pinto, MD  ferrous sulfate 325 (65 FE) MG tablet Take 325 mg by mouth daily.    Yes [provider]  furosemide (LASIX) 80 MG tablet Take 1 tablet (80 mg total) by mouth 2 (two) times daily. Take 80 mg in the morning and 40 mg in the evening Patient taking differently: Take 40-80 mg by mouth See admin instructions. Take 1 tablet (80 mg) by mouth every morning and 1/2 tablet (40 mg) mid afternoon 02/05/17  Yes Vicie Mutters, PA-C  gabapentin (NEURONTIN) 600 MG tablet Take 1/2 to 1 tablet 3 times a day for neuropathy pain in legs Patient taking differently: Take 600 mg by mouth 3 (three) times daily. for neuropathy pain in legs 06/24/17  Yes Vicie Mutters, PA-C  ipratropium (ATROVENT) 0.02 % nebulizer solution use 1 vial in nebulizer EVERY 4 HOURS AS NEEDED FOR WHEEZING OR  SHORTNESS OF BREATH 75/15=5 08/09/17  Yes Unk Pinto, MD  lactulose ([place]) 10 GM/15ML solution take 63ms TWICE DAILY Patient taking differently: Take 10 g by mouth daily.  08/08/17  Yes MUnk Pinto MD  Magnesium 250 MG TABS Take 250 mg by mouth at bedtime.    Yes [provider]  metolazone (ZAROXOLYN) 5 MG tablet Take 1 tablet (5 mg total) by mouth once a week. Take once a week with lasix or as directed for fluid overload Patient taking differently: Take 2.5 mg by mouth See admin instructions. Take 1/2 tablet (2.5 mg) by mouth daily as needed for weight gain of 2-3 lbs overnight 09/04/17  Yes MUnk Pinto MD  NONFORMULARY OR COMPOUNDED ITEM Apply 1 application topically See admin instructions. SALICYLIC ACID ACID (PETROLATUM) 50% OINTMENT-APPLY TO AFFECTED AREA AT BEDTIME PRN DRY SKIN/LEGS ([place]   Yes [provider]  nystatin cream (MYCOSTATIN) Apply 1 application topically 2 (two) times daily. Patient taking differently: Apply 1 application topically 2 (two) times daily as needed for dry skin (itching).   12/20/16  Yes CVicie Mutters PA-C  ondansetron (ZOFRAN) 4 MG tablet Take 1 tablet (4 mg total) by mouth every 6 (six) hours as needed for nausea or vomiting. 09/02/17  Yes CVicie Mutters PA-C  OXYGEN Inhale 2-3 L into the lungs continuous. 3 L with exertion   Yes [provider]  pantoprazole (PROTONIX) 40 MG tablet Take 1 tablet (40 mg total) by mouth daily. Patient taking differently: Take 40 mg by mouth daily with supper.  04/22/17  Yes MUnk Pinto MD  potassium chloride (K-DUR,KLOR-CON) 10 MEQ tablet Take 3 tablets (30 mEq total) by mouth 3 (three) times daily. Patient taking differently: Take 20-30 mEq by mouth See admin instructions. Take 3 tablets (30 meq) by mouth every morning, take 2 tablets (20 meq) mid afternoon and at bedtime 01/07/17  Yes CVicie Mutters PA-C  rifampin (RIFADIN) 300 MG capsule Take 1 capsule 2 x / day for Liver Patient taking differently: Take 300 mg by mouth 2 (two) times daily with a meal. for Liver 07/03/17 01/31/18 Yes MUnk Pinto MD  terconazole (TERAZOL 7) 0.4 % vaginal cream Place 1 applicator vaginally at bedtime. Patient taking differently: Place 1 applicator vaginally at bedtime as needed (itching).  12/20/16  Yes CVicie Mutters PA-C  traMADol (ULTRAM) 50 MG tablet Take 50 mg by mouth every 8 hours as needed for pain Patient taking differently: Take 50 mg by mouth every 8 (eight) hours as needed (pain).  05/07/17  Yes CVicie Mutters PA-C  triamcinolone cream (KENALOG) 0.1 % Apply 1 application topically 3 (three) times daily as needed for pain Patient taking differently: Apply 1 application topically 3 (three) times daily as needed (itching).  12/20/16  Yes CVicie Mutters PA-C  glucose blood test strip Take sugars once daily 01/21/17   CVicie Mutters PA-C    Family History Family History  Problem Relation Age of Onset  . Diabetes Brother        deceased  . Heart disease Sister        A Fib  . Heart disease Mother        CHF    . Atrial fibrillation Sister   . Cancer Father        lung cancer   . Cancer Maternal Uncle        unknown type cancer   . Cancer Other        lung cancer   .  Colon cancer Neg Hx     Social History Social History   Tobacco Use  . Smoking status: Former Smoker    Packs/day: 1.00    Years: 50.00    Pack years: 50.00    Types: Cigarettes    Last attempt to quit: 11/05/2014    Years since quitting: 2.8  . Smokeless tobacco: Never Used  . Tobacco comment: Quit April 2016  Substance Use Topics  . Alcohol use: No    Alcohol/week: 0.0 oz  . Drug use: No     Allergies   Atorvastatin; Diphenhydramine hcl (sleep); Hydrocodone-acetaminophen; Lopid [gemfibrozil]; Loratadine; Lorazepam; Simvastatin; Sulfamethoxazole; and Sulfonamide derivatives   Review of Systems Review of Systems  Constitutional: Negative for fever.  HENT: Positive for sore throat (attributes to intubation).   Respiratory: Positive for cough (Nonproductive ) and shortness of breath. Negative for sputum production.   Cardiovascular: Positive for chest pain.  Gastrointestinal: Positive for abdominal pain and nausea. Negative for constipation and vomiting.     Physical Exam Updated Vital Signs BP (!) 147/57 (BP Location: Left Arm)   Pulse 69   Temp 98.2 F (36.8 C) (Oral)   Resp 18   Ht 5' 2.5" (1.588 m)   Wt 84.6 kg (186 lb 8.2 oz)   SpO2 97%   BMI 33.57 kg/m   Physical Exam  Constitutional: She is oriented to person, place, and time. She appears ill (Chronically). She appears distressed.  HENT:  Head: Normocephalic and atraumatic.  Eyes: Conjunctivae are normal. No scleral icterus.  Cardiovascular: Normal rate and regular rhythm.  Pulmonary/Chest: Tachypnea noted. She is in respiratory distress. She has decreased breath sounds (throughout all lung fields ).  Abdominal: Bowel sounds are normal. She exhibits distension and ascites. There is generalized tenderness.  Abdomen distended with diffuse  tenderness. Skin is very tight, no fluid wave could be elicited.   Musculoskeletal: She exhibits edema.  Neurological: She is alert and oriented to person, place, and time. No cranial nerve deficit.  Skin: Skin is warm and dry.  Chronic venous stasis changes in LE bilaterally      ED Treatments / Results  Labs (all labs ordered are listed, but only abnormal results are displayed) Labs Reviewed  BASIC METABOLIC PANEL - Abnormal; Notable for the following components:      Result Value   Sodium 134 (*)    Potassium 3.3 (*)    Chloride 99 (*)    Glucose, Bld 165 (*)    Creatinine, Ser 1.26 (*)    Calcium 8.5 (*)    GFR calc non Af Amer 42 (*)    GFR calc Af Amer 49 (*)    All other components within normal limits  CBC - Abnormal; Notable for the following components:   RBC 2.92 (*)    Hemoglobin 9.8 (*)    HCT 30.4 (*)    MCV 104.1 (*)    RDW 16.0 (*)    Platelets 124 (*)    All other components within normal limits  LIPASE, BLOOD - Abnormal; Notable for the following components:   Lipase 84 (*)    All other components within normal limits  HEPATIC FUNCTION PANEL - Abnormal; Notable for the following components:   Total Protein 5.3 (*)    Albumin 2.7 (*)    Alkaline Phosphatase 145 (*)    Total Bilirubin 1.6 (*)    Bilirubin, Direct 0.7 (*)    All other components within normal limits  GLUCOSE, CAPILLARY - Abnormal;  Notable for the following components:   Glucose-Capillary 102 (*)    All other components within normal limits  URINALYSIS, ROUTINE W REFLEX MICROSCOPIC - Abnormal; Notable for the following components:   Hgb urine dipstick SMALL (*)    Bacteria, UA RARE (*)    Squamous Epithelial / LPF 0-5 (*)    All other components within normal limits  BODY FLUID CULTURE  PROTIME-INR  PROCALCITONIN  CBC  CREATININE, SERUM  MAGNESIUM  PHOSPHORUS  PROTIME-INR  CBC  COMPREHENSIVE METABOLIC PANEL  AMMONIA  ALBUMIN, PLEURAL OR PERITONEAL FLUID  BODY FLUID CELL  COUNT WITH DIFFERENTIAL  PROTEIN, PLEURAL OR PERITONEAL FLUID  ALBUMIN  I-STAT TROPONIN, ED  CYTOLOGY - NON PAP    EKG  EKG Interpretation None       Radiology Dg Chest 2 View  Result Date: 09/07/2017 CLINICAL DATA:  Chest pain EXAM: CHEST  2 VIEW COMPARISON:  08/30/2017 FINDINGS: Small to moderate layering right pleural effusion. Left lung is clear. No frank interstitial edema. No pneumothorax. The heart is normal in size. Mild degenerative changes of the visualized thoracolumbar spine. L1 vertebral augmentation. Cervical spine fixation hardware. IMPRESSION: Small to moderate layering right pleural effusion, new. Electronically Signed   By: Julian Hy M.D.   On: 09/07/2017 15:49   Ct Abdomen Pelvis W Contrast  Result Date: 09/07/2017 CLINICAL DATA:  Abdominal pain, swelling and shortness of breath. Status post liver cancer surgery. EXAM: CT ABDOMEN AND PELVIS WITH CONTRAST TECHNIQUE: Multidetector CT imaging of the abdomen and pelvis was performed using the standard protocol following bolus administration of intravenous contrast. CONTRAST:  84m ISOVUE-300 IOPAMIDOL (ISOVUE-300) INJECTION 61% COMPARISON:  Liver ablation CT dated 08/30/2017. Chest CT dated 07/02/2017. Abdomen and pelvis CT dated 07/11/2016. Abdomen and pelvis CT dated 07/01/2012. FINDINGS: Lower chest: Interval moderate-sized right pleural effusion with right lower lobe atelectasis. Hepatobiliary: Stable nodular liver contours with a small right lobe and enlarged lateral segment left lobe and caudate lobe. Interval post ablation changes in the right lobe and lateral segment left lobe. No visible mass separate from these changes at either location. Cholecystectomy clips. Pancreas: Unremarkable. No pancreatic ductal dilatation or surrounding inflammatory changes. Spleen: Diffusely enlarged, without significant change. Adrenals/Urinary Tract: Stable dilatation of the right renal collecting system without ureteral dilatation. A  cortical scar in the mid to upper right kidney containing a 6 mm calcification is unchanged. There is also an additional 5 mm right renal calculus more inferiorly without significant change. A 2.0 cm left renal cyst is unchanged. There is also a 1.4 cm oval low density in the medial aspect of the kidney measuring 25 Hounsfield units in density, without significant change since 07/11/2016. Portions of the urinary bladder obscured by streak artifacts from bilateral hip prostheses with no visible bladder abnormality. No visible ureteral calculi or dilatation. Stomach/Bowel: Mild fat density wall thickening involving the right colon is unchanged. Multiple proximal small bowel loops near the upper limit of normal in size are again demonstrated with normal caliber distal small bowel and normal caliber colon. No evidence of appendicitis. Vascular/Lymphatic: Atheromatous arterial calcifications without aneurysm. No enlarged lymph nodes. Reproductive: Status post hysterectomy. No adnexal masses. Other: Small to moderate amount of free peritoneal fluid. Diffuse subcutaneous edema. Musculoskeletal: Old, healed right rib fractures. Approximately 30% L1 vertebral compression deformity with kyphoplasty material. Interbody and pedicle screw and rod fixation at the L4 through S1 levels. Lumbar and lower thoracic spine degenerative changes. Bilateral hip prostheses. IMPRESSION: 1. Interval moderate-sized right pleural effusion with  right lower lobe atelectasis. 2. Stable changes of cirrhosis of the liver with interval post ablation changes and associated splenomegaly and ascites. 3. Diffuse subcutaneous edema, compatible with hypoproteinemia. 4. Stable chronic right UPJ obstruction and nonobstructing right renal calculus. 5. Stable simple left renal cyst and probable complicated left renal cyst. Electronically Signed   By: Claudie Revering M.D.   On: 09/07/2017 17:17    Procedures Procedures (including critical care  time)  Medications Ordered in ED Medications  allopurinol (ZYLOPRIM) tablet 300 mg (not administered)  aspirin EC tablet 81 mg (not administered)  bisoprolol (ZEBETA) tablet 10 mg (not administered)  citalopram (CELEXA) tablet 40 mg (not administered)  cyclobenzaprine (FLEXERIL) tablet 10 mg (not administered)  ferrous sulfate tablet 325 mg (not administered)  gabapentin (NEURONTIN) tablet 300 mg (not administered)  lactulose (CHRONULAC) 10 GM/15ML solution 10 g (not administered)  pantoprazole (PROTONIX) EC tablet 40 mg (not administered)  rifampin (RIFADIN) capsule 300 mg (not administered)  heparin injection 5,000 Units (not administered)  albumin human 25 % solution 12.5 g (not administered)  albumin human 25 % solution 25 g (not administered)  cefTRIAXone (ROCEPHIN) 2 g in dextrose 5 % 50 mL IVPB (not administered)  ipratropium-albuterol (DUONEB) 0.5-2.5 (3) MG/3ML nebulizer solution 3 mL (3 mLs Nebulization Given 09/07/17 2046)  budesonide (PULMICORT) nebulizer solution 0.25 mg (0.25 mg Nebulization Given 09/07/17 2046)  furosemide (LASIX) injection 60 mg (not administered)  potassium chloride SA (K-DUR,KLOR-CON) CR tablet 40 mEq (not administered)  cholecalciferol (VITAMIN D) tablet 1,000 Units (not administered)  potassium chloride 10 mEq in 100 mL IVPB (0 mEq Intravenous Stopped 09/07/17 1811)  fentaNYL (SUBLIMAZE) injection 50 mcg (50 mcg Intravenous Given 09/07/17 1612)  iopamidol (ISOVUE-300) 61 % injection (75 mLs  Contrast Given 09/07/17 1627)  potassium chloride SA (K-DUR,KLOR-CON) CR tablet 40 mEq (40 mEq Oral Given 09/07/17 1815)     Initial Impression / Assessment and Plan / ED Course  I have reviewed the triage vital signs and the nursing notes.  Pertinent labs & imaging results that were available during my care of the patient were reviewed by me and considered in my medical decision making (see chart for details).   Patient afebrile, VSS with increased work of breathing.  CXR and CT scan abdomen and pelvis revealed moderate size right pleural effusion. Patient also has evidence of ascites. Patients shortness of breath and abdominal pain/distention likely related to pleural effusion and worsening ascites. Patient having low grade fevers at home, but no changes in mental status. Presentation concerning for SBP. Will need admission for IR guided diagnostic and therapeutic para and thoracentesis. Consult to hospitalists placed.   Patient's pain was well controlled while in the ED with fentanyl.    Final Clinical Impressions(s) / ED Diagnoses   Final diagnoses:  Dyspnea, unspecified type  Pleural effusion on right    ED Discharge Orders        Ordered    US Paracentesis     09/07/17 1843       Melanee Spry, MD 09/07/17 1839    Melanee Spry, MD 09/07/17 2120    Orlie Dakin, MD 09/07/17 2342

## 2017-09-07 NOTE — H&P (Signed)
History and Physical  [name] GNO:[id number] DOB: 21-Nov-1945 DOA: 09/07/2017  Referring physician: ER physician PCP: Unk Pinto, MD  Outpatient Specialists: GI and oncologist Patient coming from: Home  Chief Complaint: Abdominal distention and shortness of breath.  HPI: Patient is a 72 year old Caucasian female with past medical history significant for liver cancer that was diagnosed about 1-2 months ago.  Apparently, the patient has had none alcoholic liver disease with associated cirrhosis for a long time.  MRI of the abdomen was done about 1-2 months ago, and 2 lesions were seen there with suspicious for possible malignancy.  Other significant past medical history includes splenomegaly, irritable bowel syndrome, hypertension, hyperlipidemia, hepatic encephalopathy, esophageal varices, diabetes mellitus, COPD and documented asthma.  According to the patient and her husband, the patient underwent likely and by the physician of the 2 liver lesions/malignancy 2 days ago.  Over the last 2 days, the patient has developed progressive distention of the abdomen, shortness of breath, associated nausea with no vomiting, diarrhea and abdominal pain.  On presentation to the ER, the patient was noted to have developed significant ascites.  CT scan of the abdomen and pelvis done with confirmed ascites with splenomegaly.  Patient also has right-sided pleural effusion, which could be hepatic hydrothorax, with subsequent atelectasis.  Patient denied having fever, neck pain, chest pain or urinary symptoms.  Patient reports none to specific headache.  No associated cough and no progression of yellowish brownish sputum.  The patient also has redness of the bilateral lower legs, but the patient's husband tells me that this normally occurs anytime the patient has volume overload.  Chest x-ray also confirmed right-sided pleural effusion.  Patient will be admitted for further assessment of ascites, shortness of  breath, in setting of liver cirrhosis with possible liver cancer, with history of likely M mobilization of the liver masses.  The patient is also wheezing.  On further questioning, the patient's husband informed me that the patient's months about 1-1-1/2 packs of cigarettes daily.  As mentioned above, the patient has documented COPD.  The patient's clinical situation is severe, the patient will need to be admitted to the hospital for further assessment and management.  This is necessary to avoid progressive deterioration of the patient's condition if not adequately assess and manage.    ED Course: Workup done in the ER until his CMP today reveals sodium of 134, potassium of 12.3, chloride of 99, CO2 25, BUN of 19 and creatinine of 1.26.  Blood sugar is 165.  Albumin is 2.7, lipase is 84, AST is stated within ALT of 51.  Total protein is 5.3, direct bilirubin of 0.7 and indirect of 0.9 with total bilirubin of 1.6.  CT of the abdomen is said to reveal interval moderate silent right-sided pleural effusion with right lower lobe atelectasis, stable changes of liver cirrhosis of the liver with interval post ablation changes and associated splenomegaly and ascites.  This is subcutaneous edema was reported, with chronic right UPJ obstruction and non-obstructing right renal calculus was also reported.  Stable simple left renal cyst was reported.  The low potassium is being repleted in the ER.  Hospitalist service has been consulted to admit this patient for further assessment and management. Pertinent labs: Please see above.  Above labs and imaging studies have been personally reviewed by me. EKG: Independently reviewed.  Imaging: independently reviewed.   Review of Systems: As in the history of presenting illness.  10 systems have been reviewed.  Pertinent positives and negatives  as in the history of present illness.  Specifically, the patient has none specific headache, no chest pain, no fever or chills, no  vomiting, no urinary symptoms.  Patient reports having nausea, diarrhea and shortness of breath.  Patient is also with him.  No sore throat.  Patient has lower leg rash that is suggestive of bilateral cellulitis.  No muscle aches, no bleeding reported.   Past Medical History:  Diagnosis Date  . Asthma   . Cancer [facility])    liver cancer   . COPD (chronic obstructive pulmonary disease) ([place])    x 3 years   . Depression   . Diabetes mellitus without complication ([place])    diet controlled  not on meds   . Edema    left leg   . Esophageal varices ([place])   . Fibromyalgia   . Gastritis   . GERD (gastroesophageal reflux disease)   . Hepatic cirrhosis ([place])   . Hepatic encephalopathy ([place])   . History of blood transfusion   . Hyperlipidemia   . Hypertension   . IBS (irritable bowel syndrome)   . Phlebitis    left leg x 2   . Pneumonia    hx of   . Splenomegaly   . Vitamin D deficiency     Past Surgical History:  Procedure Laterality Date  . ABDOMINAL HYSTERECTOMY    . BACK SURGERY    . CATARACT EXTRACTION, BILATERAL    . CHOLECYSTECTOMY    . ESOPHAGOGASTRODUODENOSCOPY  07/16/2012   Procedure: ESOPHAGOGASTRODUODENOSCOPY (EGD);  Surgeon: Inda Castle, MD;  Location: [facility];  Service: Endoscopy;  Laterality: N/A;  . GASTRIC VARICES BANDING  07/16/2012   Procedure: GASTRIC VARICES BANDING;  Surgeon: Inda Castle, MD;  Location: [facility];  Service: Endoscopy;  Laterality: N/A;  . HIP ARTHROPLASTY Bilateral   . IR GENERIC HISTORICAL  06/25/2016   IR RADIOLOGIST EVAL & MGMT 06/25/2016 [facility]  . IR GENERIC HISTORICAL  06/27/2016   IR KYPHO LUMBAR INC FX REDUCE BONE BX UNI/BIL CANNULATION INC/IMAGING 06/27/2016 Luanne Bras, MD [facility]  . IR GENERIC HISTORICAL  07/17/2016   IR RADIOLOGIST EVAL & MGMT 07/17/2016 [facility]  . IR RADIOLOGIST EVAL & MGMT  07/10/2017  . NECK SURGERY    . RADIOFREQUENCY ABLATION N/A 08/30/2017   Procedure: CT MICROWAVE  THERMAL ABLATION;  Surgeon: Arne Cleveland, MD;  Location: [facility] ORS;  Service: Anesthesiology;  Laterality: N/A;  . SHOULDER SURGERY Right      reports that she quit smoking about 2 years ago. Her smoking use included cigarettes. She has a 50.00 pack-year smoking history. she has never used smokeless tobacco. She reports that she does not drink alcohol or use drugs.  Allergies  Allergen Reactions  . Atorvastatin Other (See Comments)    Unknown reaction  . Diphenhydramine Hcl (Sleep) Hives  . Hydrocodone-Acetaminophen Other (See Comments)    Unknown reaction  . Lopid [Gemfibrozil] Other (See Comments)    Unknown reaction  . Loratadine Hives  . Lorazepam Hives  . Simvastatin Other (See Comments)    Unknown reaction  . Sulfamethoxazole Hives  . Sulfonamide Derivatives Hives    Family History  Problem Relation Age of Onset  . Diabetes Brother        deceased  . Heart disease Sister        A Fib  . Heart disease Mother        CHF  . Atrial fibrillation Sister   . Cancer Father  lung cancer   . Cancer Maternal Uncle        unknown type cancer   . Cancer Other        lung cancer   . Colon cancer Neg Hx      Prior to Admission medications   Medication Sig Start Date End Date Taking? Authorizing Provider  albuterol (PROVENTIL HFA;VENTOLIN HFA) 108 (90 Base) MCG/ACT inhaler Inhale 2 puffs into the lungs every 6 (six) hours as needed for wheezing or shortness of breath. 05/27/16  Yes Ward, Delice Bison, DO  allopurinol (ZYLOPRIM) 300 MG tablet take 1/2-1 tablet by mouth daily for gout Patient taking differently: take 1 tablet (300 mg) by mouth daily with supper - for gout 04/29/17  Yes Unk Pinto, MD  aspirin EC 81 MG tablet Take 81 mg by mouth at bedtime.   Yes [provider]  bisoprolol (ZEBETA) 10 MG tablet Take 1 tablet (10 mg total) by mouth daily. Patient taking differently: Take 10 mg by mouth daily with supper.  04/09/17  Yes Vicie Mutters, PA-C    Cholecalciferol (VITAMIN D3) 5000 units CAPS Take 5,000 Units by mouth daily with breakfast.   Yes [provider]  citalopram (CELEXA) 40 MG tablet Take 1 tablet (40 mg total) by mouth 2 (two) times daily. Patient taking differently: Take 40 mg by mouth daily with supper.  06/18/17  Yes Vicie Mutters, PA-C  cyclobenzaprine (FLEXERIL) 10 MG tablet Take 1 tablet (10 mg total) by mouth 3 (three) times daily as needed for muscle spasms. 03/06/16  Yes Unk Pinto, MD  ferrous sulfate 325 (65 FE) MG tablet Take 325 mg by mouth daily.    Yes [provider]  furosemide (LASIX) 80 MG tablet Take 1 tablet (80 mg total) by mouth 2 (two) times daily. Take 80 mg in the morning and 40 mg in the evening Patient taking differently: Take 40-80 mg by mouth See admin instructions. Take 1 tablet (80 mg) by mouth every morning and 1/2 tablet (40 mg) mid afternoon 02/05/17  Yes Vicie Mutters, PA-C  gabapentin (NEURONTIN) 600 MG tablet Take 1/2 to 1 tablet 3 times a day for neuropathy pain in legs Patient taking differently: Take 600 mg by mouth 3 (three) times daily. for neuropathy pain in legs 06/24/17  Yes Vicie Mutters, PA-C  ipratropium (ATROVENT) 0.02 % nebulizer solution use 1 vial in nebulizer EVERY 4 HOURS AS NEEDED FOR WHEEZING OR SHORTNESS OF BREATH 75/15=5 08/09/17  Yes Unk Pinto, MD  lactulose (CHRONULAC) 10 GM/15ML solution take 68ms TWICE DAILY Patient taking differently: Take 10 g by mouth daily.  08/08/17  Yes MUnk Pinto MD  Magnesium 250 MG TABS Take 250 mg by mouth at bedtime.    Yes [provider]  metolazone (ZAROXOLYN) 5 MG tablet Take 1 tablet (5 mg total) by mouth once a week. Take once a week with lasix or as directed for fluid overload Patient taking differently: Take 2.5 mg by mouth See admin instructions. Take 1/2 tablet (2.5 mg) by mouth daily as needed for weight gain of 2-3 lbs overnight 09/04/17  Yes MUnk Pinto MD  NONFORMULARY OR  COMPOUNDED ITEM Apply 1 application topically See admin instructions. SALICYLIC ACID ACID (PETROLATUM) 50% OINTMENT-APPLY TO AFFECTED AREA AT BEDTIME PRN DRY SKIN/LEGS (Compounded at [facility]   Yes [provider]  nystatin cream (MYCOSTATIN) Apply 1 application topically 2 (two) times daily. Patient taking differently: Apply 1 application topically 2 (two) times daily as needed for dry  skin (itching).  12/20/16  Yes Vicie Mutters, PA-C  ondansetron (ZOFRAN) 4 MG tablet Take 1 tablet (4 mg total) by mouth every 6 (six) hours as needed for nausea or vomiting. 09/02/17  Yes Vicie Mutters, PA-C  OXYGEN Inhale 2-3 L into the lungs continuous. 3 L with exertion   Yes [provider]  pantoprazole (PROTONIX) 40 MG tablet Take 1 tablet (40 mg total) by mouth daily. Patient taking differently: Take 40 mg by mouth daily with supper.  04/22/17  Yes Unk Pinto, MD  potassium chloride (K-DUR,KLOR-CON) 10 MEQ tablet Take 3 tablets (30 mEq total) by mouth 3 (three) times daily. Patient taking differently: Take 20-30 mEq by mouth See admin instructions. Take 3 tablets (30 meq) by mouth every morning, take 2 tablets (20 meq) mid afternoon and at bedtime 01/07/17  Yes Vicie Mutters, PA-C  rifampin (RIFADIN) 300 MG capsule Take 1 capsule 2 x / day for Liver Patient taking differently: Take 300 mg by mouth 2 (two) times daily with a meal. for Liver 07/03/17 01/31/18 Yes Unk Pinto, MD  terconazole (TERAZOL 7) 0.4 % vaginal cream Place 1 applicator vaginally at bedtime. Patient taking differently: Place 1 applicator vaginally at bedtime as needed (itching).  12/20/16  Yes Vicie Mutters, PA-C  traMADol (ULTRAM) 50 MG tablet Take 50 mg by mouth every 8 hours as needed for pain Patient taking differently: Take 50 mg by mouth every 8 (eight) hours as needed (pain).  05/07/17  Yes Vicie Mutters, PA-C  triamcinolone cream (KENALOG) 0.1 % Apply 1 application topically 3 (three) times  daily as needed for pain Patient taking differently: Apply 1 application topically 3 (three) times daily as needed (itching).  12/20/16  Yes Vicie Mutters, PA-C  glucose blood test strip Take sugars once daily 01/21/17   Vicie Mutters, [place]    Physical Exam: Vitals:   09/07/17 1650 09/07/17 1700 09/07/17 1730 09/07/17 1745  BP:  (!) 128/55 (!) 143/81 119/65  Pulse: 91 89 89 85  Resp: 13 16 12 12   Temp:      TempSrc:      SpO2: 96% 95% 97% 97%     Constitutional:  . Uncomfortable but calm.  Eyes:  . Marland Kitchen  Pallor. No jaundice.  ENMT:  . external ears, nose appear normal Neck:  . Neck is supple. No JVD Respiratory:  . Decreased air entry especially on the right side. . Wheezing.    Cardiovascular:  . S1S2 . LE extremity edema   Abdomen:  . Abdomen is distended, mildly firm and tender.  Organs are difficult to assess. Neurologic:  . Awake and alert. . Moves all limbs.  Wt Readings from Last 3 Encounters:  09/03/17 84.8 kg (187 lb)  08/30/17 84.8 kg (186 lb 15.2 oz)  07/10/17 86.6 kg (191 lb)    I have personally reviewed following labs and imaging studies  Labs on Admission:  CBC: Recent Labs  Lab 09/03/17 1547 09/07/17 1428  WBC 7.8 6.9  NEUTROABS 6,552  --   HGB 9.5* 9.8*  HCT 28.1* 30.4*  MCV 98.6 104.1*  PLT 71* 557*   Basic Metabolic Panel: Recent Labs  Lab 09/03/17 1547 09/07/17 1428  NA 136 134*  K 5.0 3.3*  CL 103 99*  CO2 29 25  GLUCOSE 108* 165*  BUN 23 19  CREATININE 1.10* 1.26*  CALCIUM 8.3* 8.5*  MG 2.0  --    Liver Function Tests: Recent Labs  Lab 09/03/17 1547 09/07/17 1428  AST 66*  30  ALT 121* 51  ALKPHOS  --  145*  BILITOT 1.8* 1.6*  PROT 4.8* 5.3*  ALBUMIN  --  2.7*   Recent Labs  Lab 09/07/17 1428  LIPASE 84*   Recent Labs  Lab 09/03/17 1547  AMMONIA 67   Coagulation Profile: Recent Labs  Lab 09/07/17 1428  INR 1.17   Cardiac Enzymes: No results for input(s): CKTOTAL, CKMB, CKMBINDEX, TROPONINI in  the last 168 hours. BNP (last 3 results) No results for input(s): PROBNP in the last 8760 hours. HbA1C: No results for input(s): HGBA1C in the last 72 hours. CBG: No results for input(s): GLUCAP in the last 168 hours. Lipid Profile: No results for input(s): CHOL, HDL, LDLCALC, TRIG, CHOLHDL, LDLDIRECT in the last 72 hours. Thyroid Function Tests: No results for input(s): TSH, T4TOTAL, FREET4, T3FREE, THYROIDAB in the last 72 hours. Anemia Panel: No results for input(s): VITAMINB12, FOLATE, FERRITIN, TIBC, IRON, RETICCTPCT in the last 72 hours. Urine analysis:    Component Value Date/Time   COLORURINE DARK YELLOW 09/03/2017 1547   APPEARANCEUR CLOUDY (A) 09/03/2017 1547   LABSPEC 1.016 09/03/2017 1547   PHURINE 5.5 09/03/2017 1547   GLUCOSEU NEGATIVE 09/03/2017 1547   HGBUR NEGATIVE 09/03/2017 1547   BILIRUBINUR NEGATIVE 12/20/2016 1645   [place] 09/03/2017 1547   PROTEINUR NEGATIVE 09/03/2017 1547   UROBILINOGEN 1 01/13/2015 1145   NITRITE POSITIVE (A) 09/03/2017 1547   LEUKOCYTESUR 2+ (A) 09/03/2017 1547   Sepsis Labs: @LABRCNTIP (procalcitonin:4,lacticidven:4) )No results found for this or any previous visit (from the past 240 hour(s)).    Radiological Exams on Admission: Dg Chest 2 View  Result Date: 09/07/2017 CLINICAL DATA:  Chest pain EXAM: CHEST  2 VIEW COMPARISON:  08/30/2017 FINDINGS: Small to moderate layering right pleural effusion. Left lung is clear. No frank interstitial edema. No pneumothorax. The heart is normal in size. Mild degenerative changes of the visualized thoracolumbar spine. L1 vertebral augmentation. Cervical spine fixation hardware. IMPRESSION: Small to moderate layering right pleural effusion, new. Electronically Signed   By: Julian Hy M.D.   On: 09/07/2017 15:49   Ct Abdomen Pelvis W Contrast  Result Date: 09/07/2017 CLINICAL DATA:  Abdominal pain, swelling and shortness of breath. Status post liver cancer surgery. EXAM: CT  ABDOMEN AND PELVIS WITH CONTRAST TECHNIQUE: Multidetector CT imaging of the abdomen and pelvis was performed using the standard protocol following bolus administration of intravenous contrast. CONTRAST:  23m ISOVUE-300 IOPAMIDOL (ISOVUE-300) INJECTION 61% COMPARISON:  Liver ablation CT dated 08/30/2017. Chest CT dated 07/02/2017. Abdomen and pelvis CT dated 07/11/2016. Abdomen and pelvis CT dated 07/01/2012. FINDINGS: Lower chest: Interval moderate-sized right pleural effusion with right lower lobe atelectasis. Hepatobiliary: Stable nodular liver contours with a small right lobe and enlarged lateral segment left lobe and caudate lobe. Interval post ablation changes in the right lobe and lateral segment left lobe. No visible mass separate from these changes at either location. Cholecystectomy clips. Pancreas: Unremarkable. No pancreatic ductal dilatation or surrounding inflammatory changes. Spleen: Diffusely enlarged, without significant change. Adrenals/Urinary Tract: Stable dilatation of the right renal collecting system without ureteral dilatation. A cortical scar in the mid to upper right kidney containing a 6 mm calcification is unchanged. There is also an additional 5 mm right renal calculus more inferiorly without significant change. A 2.0 cm left renal cyst is unchanged. There is also a 1.4 cm oval low density in the medial aspect of the kidney measuring 25 Hounsfield units in density, without significant change since 07/11/2016. Portions of the  urinary bladder obscured by streak artifacts from bilateral hip prostheses with no visible bladder abnormality. No visible ureteral calculi or dilatation. Stomach/Bowel: Mild fat density wall thickening involving the right colon is unchanged. Multiple proximal small bowel loops near the upper limit of normal in size are again demonstrated with normal caliber distal small bowel and normal caliber colon. No evidence of appendicitis. Vascular/Lymphatic: Atheromatous  arterial calcifications without aneurysm. No enlarged lymph nodes. Reproductive: Status post hysterectomy. No adnexal masses. Other: Small to moderate amount of free peritoneal fluid. Diffuse subcutaneous edema. Musculoskeletal: Old, healed right rib fractures. Approximately 30% L1 vertebral compression deformity with kyphoplasty material. Interbody and pedicle screw and rod fixation at the L4 through S1 levels. Lumbar and lower thoracic spine degenerative changes. Bilateral hip prostheses. IMPRESSION: 1. Interval moderate-sized right pleural effusion with right lower lobe atelectasis. 2. Stable changes of cirrhosis of the liver with interval post ablation changes and associated splenomegaly and ascites. 3. Diffuse subcutaneous edema, compatible with hypoproteinemia. 4. Stable chronic right UPJ obstruction and nonobstructing right renal calculus. 5. Stable simple left renal cyst and probable complicated left renal cyst. Electronically Signed   By: Claudie Revering M.D.   On: 09/07/2017 17:17    EKG: Independently reviewed.   Active Problems:   Ascites   Assessment/Plan  1. Liver cirrhosis with recent diagnosis of hepatocellular carcinoma. 2. Hepatocellular carcinoma. 3. Status post embolization of the hip hepatocellular masses about 2 days prior to presentation. 4. Moderate to severe ascites. 5. Abdominal pain, likely related to the severe ascites. 6. Pleural effusion, suspicious for hepatic hydrothorax. 7. Hypokalemia, likely multifactorial.  Patient is having diarrhea, and the patient was on diuretics prior to admission. 8. Rule out SBP. 9. Rule out acute hepatic failure. 10. Cellulitis of bilateral lower legs. 11. Wheezing, possible related to COPD with exacerbation. 12. Possible COPD with exacerbation. 13. Volume overload 14. Hypertension. 15. Diabetes mellitus, historical. 16. Hypoalbuminemia, likely multifactorial.  Dizziness setting of liver disease.   Admit patient for further  assessment and management.    Consult for paracentesis and ascitic fluid analysis.  Started IV antibiotics after obtaining fluid for ascitic fluid analysis.   IV albumin with paracentesis.  Cautious diuresis.  Start patient on DuoNeb and pulmicort.  Monitor renal function and electrolytes.   Liver cirrhosis with hepatocellular carcinoma. The patient had MRI 2 months ago that revealed 2 liver masses. Status post likely embolization of the liver masses 2 days ago. Continue with lactulose. Continue to monitor INR. Avoid constipation. Treat hypokalemia. Aggressively for source of infection and treat accordingly.  Ascites. This followed embolization of the 2 liver masses. Consult IR for paracentesis. Send as sensitive fluid for analysis. Antibiotics IV after ascitic fluid has been collected. Monitor chest x-ray to see the pleural effusion will resolve with paracentesis.  Pleural effusion. This is likely hepatic hydrothorax. Monitor resolution with paracentesis.  Hypokalemia. Currently, the patient is having diarrhea. The patient is on lactulose chronically for hepatic encephalopathy. The patient is also on diuretics. Cause of hypokalemia is likely secondary to above. Will replete potassium. We will continue to monitor electrolytes.  Possible spontaneous bacterial peritonitis. This is always a possibility in the setting of new onset of ascitic fluid collection in liver disease. Follow ascitic fluid analysis.  Possible acute hepatic failure. Onset of decompensation is rather acute. This could be related to recent embolization of the liver masses. Have a low threshold to consult GI and oncology. Continue to monitor.  Cellulitis of bilateral lower legs. IV antibiotics. Manage  volume overload.  Wheezing and possible COPD with exacerbation. Start patient on treatment. Continue to assess.  Volume overload. This likely secondary to liver disease.   IV  Lasix.  Diabetes mellitus/hypertension/other chronic illness. Manage expectantly.   DVT prophylaxis subcu heparin.   Code Status: Full Family Communication: Husband. Disposition Plan: To be determined. Consults called: Will consult IR for paracentesis. Admission status: Inpatient.  Time spent: 65 minutes  Dana Allan, MD  [facility] Pager #: [phone] 7PM-7AM contact night coverage as above   09/07/2017, 6:44 PM

## 2017-09-07 NOTE — ED Notes (Signed)
Per [place] in main lab, will add on HFP and PT-INR

## 2017-09-07 NOTE — ED Triage Notes (Signed)
Pt presents to ED for assessment of abdominal distention, SOB and chest pain since a liver biopsy last week where she was d/c'd on Saturday.

## 2017-09-07 NOTE — ED Notes (Signed)
XR notified pt ready for scan

## 2017-09-07 NOTE — ED Notes (Signed)
Pt respirations even at rest. When pt attempts to push self up in bed or exerts herself too much, pt begins coughing and respiratory rate increases with expiratory wheezing. O2 sat does not drop while pt exerts herself.

## 2017-09-08 ENCOUNTER — Inpatient Hospital Stay (HOSPITAL_COMMUNITY): Payer: PPO

## 2017-09-08 ENCOUNTER — Other Ambulatory Visit: Payer: Self-pay

## 2017-09-08 ENCOUNTER — Encounter (HOSPITAL_COMMUNITY): Payer: Self-pay | Admitting: *Deleted

## 2017-09-08 DIAGNOSIS — J9600 Acute respiratory failure, unspecified whether with hypoxia or hypercapnia: Secondary | ICD-10-CM

## 2017-09-08 DIAGNOSIS — R140 Abdominal distension (gaseous): Secondary | ICD-10-CM

## 2017-09-08 DIAGNOSIS — E8770 Fluid overload, unspecified: Secondary | ICD-10-CM

## 2017-09-08 DIAGNOSIS — I5031 Acute diastolic (congestive) heart failure: Secondary | ICD-10-CM

## 2017-09-08 LAB — CBC
HCT: 25.5 % — ABNORMAL LOW (ref 36.0–46.0)
Hemoglobin: 8.1 g/dL — ABNORMAL LOW (ref 12.0–15.0)
MCH: 33.6 pg (ref 26.0–34.0)
MCHC: 31.8 g/dL (ref 30.0–36.0)
MCV: 105.8 fL — ABNORMAL HIGH (ref 78.0–100.0)
Platelets: 101 10*3/uL — ABNORMAL LOW (ref 150–400)
RBC: 2.41 MIL/uL — ABNORMAL LOW (ref 3.87–5.11)
RDW: 16.3 % — ABNORMAL HIGH (ref 11.5–15.5)
WBC: 4.4 10*3/uL (ref 4.0–10.5)

## 2017-09-08 LAB — MAGNESIUM: Magnesium: 2 mg/dL (ref 1.7–2.4)

## 2017-09-08 LAB — COMPREHENSIVE METABOLIC PANEL
ALT: 35 U/L (ref 14–54)
ALT: 37 U/L (ref 14–54)
AST: 33 U/L (ref 15–41)
AST: 29 U/L (ref 15–41)
Albumin: 2.6 g/dL — ABNORMAL LOW (ref 3.5–5.0)
Albumin: 2.7 g/dL — ABNORMAL LOW (ref 3.5–5.0)
Alkaline Phosphatase: 129 U/L — ABNORMAL HIGH (ref 38–126)
Alkaline Phosphatase: 131 U/L — ABNORMAL HIGH (ref 38–126)
Anion gap: 10 (ref 5–15)
Anion gap: 11 (ref 5–15)
BUN: 19 mg/dL (ref 6–20)
BUN: 20 mg/dL (ref 6–20)
CO2: 26 mmol/L (ref 22–32)
CO2: 26 mmol/L (ref 22–32)
Calcium: 8.6 mg/dL — ABNORMAL LOW (ref 8.9–10.3)
Calcium: 8.4 mg/dL — ABNORMAL LOW (ref 8.9–10.3)
Chloride: 103 mmol/L (ref 101–111)
Chloride: 100 mmol/L — ABNORMAL LOW (ref 101–111)
Creatinine, Ser: 1.26 mg/dL — ABNORMAL HIGH (ref 0.44–1.00)
Creatinine, Ser: 1.29 mg/dL — ABNORMAL HIGH (ref 0.44–1.00)
GFR calc Af Amer: 49 mL/min — ABNORMAL LOW (ref >60)
GFR calc Af Amer: 47 mL/min — ABNORMAL LOW (ref >60)
GFR calc non Af Amer: 42 mL/min — ABNORMAL LOW (ref >60)
GFR calc non Af Amer: 41 mL/min — ABNORMAL LOW (ref >60)
Glucose, Bld: 91 mg/dL (ref 65–99)
Glucose, Bld: 114 mg/dL — ABNORMAL HIGH (ref 65–99)
Potassium: 4.3 mmol/L (ref 3.5–5.1)
Potassium: 3.9 mmol/L (ref 3.5–5.1)
Sodium: 139 mmol/L (ref 135–145)
Sodium: 137 mmol/L (ref 135–145)
Total Bilirubin: 1.6 mg/dL — ABNORMAL HIGH (ref 0.3–1.2)
Total Bilirubin: 1.6 mg/dL — ABNORMAL HIGH (ref 0.3–1.2)
Total Protein: 4.7 g/dL — ABNORMAL LOW (ref 6.5–8.1)
Total Protein: 4.8 g/dL — ABNORMAL LOW (ref 6.5–8.1)

## 2017-09-08 LAB — GLUCOSE, CAPILLARY
Glucose-Capillary: 110 mg/dL — ABNORMAL HIGH (ref 65–99)
Glucose-Capillary: 141 mg/dL — ABNORMAL HIGH (ref 65–99)
Glucose-Capillary: 135 mg/dL — ABNORMAL HIGH (ref 65–99)
Glucose-Capillary: 126 mg/dL — ABNORMAL HIGH (ref 65–99)

## 2017-09-08 LAB — PHOSPHORUS: Phosphorus: 3.8 mg/dL (ref 2.5–4.6)

## 2017-09-08 LAB — PROTIME-INR
INR: 1.29
Prothrombin Time: 16 seconds — ABNORMAL HIGH (ref 11.4–15.2)

## 2017-09-08 LAB — BRAIN NATRIURETIC PEPTIDE: B Natriuretic Peptide: 144.3 pg/mL — ABNORMAL HIGH (ref 0.0–100.0)

## 2017-09-08 MED ORDER — LIDOCAINE HCL (PF) 1 % IJ SOLN
INTRAMUSCULAR | Status: AC
  Filled 2017-09-08: qty 30

## 2017-09-08 MED ORDER — TRAZODONE HCL 50 MG PO TABS
50.0000 mg | ORAL_TABLET | Freq: Every evening | ORAL | Status: DC | PRN
  Administered 2017-09-08 – 2017-09-12 (×4): 50 mg via ORAL
  Filled 2017-09-08 (×4): qty 1

## 2017-09-08 MED ORDER — ONDANSETRON HCL 4 MG/2ML IJ SOLN
4.0000 mg | Freq: Four times a day (QID) | INTRAMUSCULAR | Status: DC | PRN
  Administered 2017-09-08 – 2017-09-13 (×3): 4 mg via INTRAVENOUS
  Filled 2017-09-08 (×3): qty 2

## 2017-09-08 MED ORDER — PHENOL 1.4 % MT LIQD
1.0000 | OROMUCOSAL | Status: DC | PRN
  Administered 2017-09-08: 1 via OROMUCOSAL
  Filled 2017-09-08: qty 177

## 2017-09-08 MED ORDER — ONDANSETRON HCL 4 MG/2ML IJ SOLN
4.0000 mg | Freq: Four times a day (QID) | INTRAMUSCULAR | Status: DC
  Administered 2017-09-08: 4 mg via INTRAVENOUS
  Filled 2017-09-08: qty 2

## 2017-09-08 MED ORDER — TRAZODONE HCL 50 MG PO TABS
50.0000 mg | ORAL_TABLET | Freq: Once | ORAL | Status: AC
  Administered 2017-09-08: 50 mg via ORAL
  Filled 2017-09-08: qty 1

## 2017-09-08 MED ORDER — DM-GUAIFENESIN ER 30-600 MG PO TB12
1.0000 | ORAL_TABLET | Freq: Two times a day (BID) | ORAL | Status: DC | PRN
  Administered 2017-09-08 – 2017-09-12 (×3): 1 via ORAL
  Filled 2017-09-08 (×3): qty 1

## 2017-09-08 NOTE — Progress Notes (Signed)
Earlier in the night, the patient requested something for sleep.  [facility] called and order received for Trazadone.  This was given at 0204 with minimal relief.  Patient then complained of increased coughing and a sore throat.  [facility] called and orders received for Mucinex DM and Chloraseptic Spray which was given at 0456.  Upon reassessment, patient is asleep.  Will continue to monitor patient.  Earleen Reaper RN-BC, [facility]

## 2017-09-08 NOTE — Progress Notes (Signed)
Patient ID: [name], female   DOB: [birth year], 72 y.o.   MRN: [id number]   Paracentesis requested  [place] [employer] Abd performed  Tiny collection at RLQ No paracentesis performed  Back to room

## 2017-09-08 NOTE — Progress Notes (Signed)
New Admission Note:   Arrival Method: Via stretcher from the ED Mental Orientation:  A & O x 4 Telemetry:   Tele #[id number] Assessment: Completed Skin:  See Assessment IV:  Left Forearm NSL Pain:   [place] Tubes:  [place] Safety Measures: Safety Fall Prevention Plan has been given, discussed and signed Admission: Completed [address] [place] Orientation: Patient has been orientated to the room, unit and staff.  Family:  Husband at bedside  Orders have been reviewed and implemented. Will continue to monitor the patient. Call light has been placed within reach and bed alarm has been activated.   Earleen Reaper RN- BC, [facility] Phone number: [phone]

## 2017-09-08 NOTE — Progress Notes (Signed)
PROGRESS NOTE    [name]  CHE:[id number] DOB: 28-Dec-1945 DOA: 09/07/2017 PCP: Unk Pinto, MD  Outpatient Specialists:   Brief Narrative: The patient is a 72 year old Caucasian female with past medical history significant for hepatocellular carcinoma that was diagnosed about 1-2 months ago status post CT guided ablation of the tumor on 53/61/4431, diastolic dysfunction as per echo done in 2017, splenomegaly, irritable bowel syndrome, hypertension, hyperlipidemia, hepatic encephalopathy, esophageal varices, diabetes mellitus, COPD, tobacco use disorder, continuous, and documented history of asthma.  Patient was admitted with abdominal distention, shortness of breath, wheezing, cellulitic changes of bilateral lower legs, bilateral lower leg edema as well as right-sided pleural effusion.  On presentation, there were concerns for possible ascites.  However, abdominal ultrasound done did not reveal significant ascites.  Patient was started on IV Lasix 60 mg twice daily on admission.  Patient was also started on nebulizer treatment.  Patient was started on antibiotics.  Overnight, discharge shortness of breath and wheezing have improved significantly.  Abdominal distention has also improved with aggressive diuresis.  Leg edema has resolved.  Cellulitis of bilateral lower extremities is also improving.  Will continue IV diuresis for now.  We will get an echocardiogram.  We will continue IV antibiotics.  We will continue the neb treatments.  Patient has been counseled to quit tobacco use.  Overall, the patient is improving but not back to her baseline.  The patient already has contact with GI team and oncology team.  Patient will remain in the hospital for now for continued assessment and management.   Assessment/Plan  1. Acute respiratory failure, likely multifactorial. 2. Likely acute diastolic congestive heart failure with exacerbation. 3. Volume overload, worrisome for mild anasarca. 4. Right  pleural effusion. 5. Liver cirrhosis with recent diagnosis of hepatocellular carcinoma. 6. Hepatocellular carcinoma. 7. Status post ablation of hepatocellular masses on 08/30/2017.   8. Mild ascites, not amenable to ultrasound-guided paracentesis.   9. Abdominal pain, likely related to abdominal distention and edema.   10. Pleural effusion. 11. Hypokalemia, resolved.  12. Doubt spontaneous bacterial peritonitis.  13. Doubt acute hepatic failure. 14. Cellulitis of bilateral lower legs. 15. Wheezing, possible related to COPD with exacerbation. 16. Possible COPD with exacerbation. 17. Hypertension. 18. Diabetes mellitus, historical. 19. Hypoalbuminemia, likely multifactorial.     Continue to assess and manage patient.      Ascites is not amenable to ultrasound-guided paracentesis.    Continue IV antibiotics.   IV albumin with paracentesis.  Cautious diuresis.  Continue patient on DuoNeb and pulmicort.  Monitor renal function and electrolytes.  Echocardiogram.  Repeat chest x-ray.   Acute respiratory failure, multifactorial. -Patient has diastolic dysfunction. -Patient had H shortness of breath and wheezing. -Symptoms improved with aggressive diuresis and nebulizer treatment. -I suspect this is secondary to acute diastolic congestive heart failure with exacerbation and possible COPD with exacerbation. -Patient is obese, and may have undiagnosed obstructive sleep apnea as well. -Patient had volume overload, possible anasarca, with abdominal distention that could have affected diaphragmatic function. -Continue diuresis, but cautiously. -Continue nebulizer treatment.  Suspect acute diastolic congestive heart failure with exacerbation. -Continue diuretics. -Procedure echocardiogram.  Last echo was done in 2017 and revealed grade 1 diastolic dysfunction. -Continue beta-blockers.  Liver cirrhosis with hepatocellular carcinoma. The patient had MRI 2 months ago that  revealed 2 liver masses. Status post ablation of the liver masses 2 days ago. Continue with lactulose. Continue to monitor INR. Avoid constipation. Treat hypokalemia. Aggressively for source of infection and treat accordingly.  Ascites. This is mild, not amenable to ultrasound-guided paracentesis. Continue IV diuresis. Continue to monitor.    Pleural effusion. Will repeat chest x-ray. We will continue to monitor closely. May be related to volume overload and low albumin.   Hypokalemia. Currently, the patient is having diarrhea. The patient is on lactulose chronically for hepatic encephalopathy. The patient is also on diuretics. Cause of hypokalemia is likely secondary to above. Will replete potassium. Resolved.  Possible spontaneous bacterial peritonitis. This is less likely considering amount of ascitic fluid.     Cellulitis of bilateral lower legs/leg edema.. Continue IV antibiotics. Manage volume overload.  Wheezing and possible COPD with exacerbation. Start patient on treatment. Continue to assess.  Volume overload. Possible secondary to heart disease and liver disease.  IV Lasix.  Diabetes mellitus/hypertension/other chronic illness. Manage expectantly.   DVT prophylaxis subcu heparin.   Code Status: Full Family Communication: Disposition Plan: To be determined. Consults called:  Admission status: Inpatient.  Consultants:   None.  Procedures:   None.  Antimicrobials:   IV Rocephin started on 09/07/2017.   Subjective: Shortness of breath and wheezing have improved. However, the patient has not back to her baseline.  Objective: Vitals:   09/07/17 2111 09/08/17 0135 09/08/17 0434 09/08/17 0817  BP: (!) 147/57  (!) 138/45   Pulse: 69 85 81   Resp: 18 18 20    Temp: 98.2 F (36.8 C)  98.7 F (37.1 C)   TempSrc: Oral  Oral   SpO2: 97% 96% 98% 92%  Weight: 84.6 kg (186 lb 8.2 oz)     Height: 5' 2.5" (1.588 m)       Intake/Output  Summary (Last 24 hours) at 09/08/2017 1008 Last data filed at 09/08/2017 0644 Gross per 24 hour  Intake 460 ml  Output 825 ml  Net -365 ml   Filed Weights   09/07/17 2111  Weight: 84.6 kg (186 lb 8.2 oz)    Examination:  General exam: Appears calm and comfortable.  Respiratory system: Improved air entry.  Wheezing has improved as well.  Cardiovascular system: S1 & S2 heard, Gastrointestinal system: Abdomen is obese, soft and nontender.  Bowel sounds are present.   Central nervous system: Alert and oriented. No focal neurological deficits. Extremities: Leg edema has improved.  Cellulitis has improved.  The patient has chronic skin changes of the lower legs.  Data Reviewed: I have personally reviewed following labs and imaging studies  CBC: Recent Labs  Lab 09/03/17 1547 09/07/17 1428 09/07/17 2032  WBC 7.8 6.9 5.7  NEUTROABS 6,552  --   --   HGB 9.5* 9.8* 9.0*  HCT 28.1* 30.4* 28.0*  MCV 98.6 104.1* 103.7*  PLT 71* 124* 161*   Basic Metabolic Panel: Recent Labs  Lab 09/03/17 1547 09/07/17 1428 09/07/17 2032  NA 136 134*  --   K 5.0 3.3*  --   CL 103 99*  --   CO2 29 25  --   GLUCOSE 108* 165*  --   BUN 23 19  --   CREATININE 1.10* 1.26* 1.27*  CALCIUM 8.3* 8.5*  --   MG 2.0  --  2.0  PHOS  --   --  3.7   GFR: Estimated Creatinine Clearance: 42 mL/min (A) (by C-G formula based on SCr of 1.27 mg/dL (H)). Liver Function Tests: Recent Labs  Lab 09/03/17 1547 09/07/17 1428 09/07/17 2032  AST 66* 30  --   ALT 121* 51  --   ALKPHOS  --  145*  --  BILITOT 1.8* 1.6*  --   PROT 4.8* 5.3*  --   ALBUMIN  --  2.7* 2.5*   Recent Labs  Lab 09/07/17 1428  LIPASE 84*   Recent Labs  Lab 09/03/17 1547 09/07/17 2032  AMMONIA 67 18   Coagulation Profile: Recent Labs  Lab 09/07/17 1428  INR 1.17   Cardiac Enzymes: No results for input(s): CKTOTAL, CKMB, CKMBINDEX, TROPONINI in the last 168 hours. BNP (last 3 results) No results for input(s): PROBNP in the  last 8760 hours. HbA1C: No results for input(s): HGBA1C in the last 72 hours. CBG: Recent Labs  Lab 09/07/17 2015 09/08/17 0757  GLUCAP 102* 110*   Lipid Profile: No results for input(s): CHOL, HDL, LDLCALC, TRIG, CHOLHDL, LDLDIRECT in the last 72 hours. Thyroid Function Tests: No results for input(s): TSH, T4TOTAL, FREET4, T3FREE, THYROIDAB in the last 72 hours. Anemia Panel: No results for input(s): VITAMINB12, FOLATE, FERRITIN, TIBC, IRON, RETICCTPCT in the last 72 hours. Urine analysis:    Component Value Date/Time   COLORURINE YELLOW 09/07/2017 2036   APPEARANCEUR CLEAR 09/07/2017 2036   LABSPEC 1.015 09/07/2017 2036   PHURINE 6.0 09/07/2017 2036   GLUCOSEU NEGATIVE 09/07/2017 2036   HGBUR SMALL (A) 09/07/2017 2036   BILIRUBINUR NEGATIVE 09/07/2017 2036   KETONESUR NEGATIVE 09/07/2017 2036   PROTEINUR NEGATIVE 09/07/2017 2036   UROBILINOGEN 1 01/13/2015 1145   NITRITE NEGATIVE 09/07/2017 2036   LEUKOCYTESUR NEGATIVE 09/07/2017 2036   Sepsis Labs: @LABRCNTIP (procalcitonin:4,lacticidven:4)  )No results found for this or any previous visit (from the past 240 hour(s)).       Radiology Studies: Dg Chest 2 View  Result Date: 09/07/2017 CLINICAL DATA:  Chest pain EXAM: CHEST  2 VIEW COMPARISON:  08/30/2017 FINDINGS: Small to moderate layering right pleural effusion. Left lung is clear. No frank interstitial edema. No pneumothorax. The heart is normal in size. Mild degenerative changes of the visualized thoracolumbar spine. L1 vertebral augmentation. Cervical spine fixation hardware. IMPRESSION: Small to moderate layering right pleural effusion, new. Electronically Signed   By: Julian Hy M.D.   On: 09/07/2017 15:49   Ct Abdomen Pelvis W Contrast  Result Date: 09/07/2017 CLINICAL DATA:  Abdominal pain, swelling and shortness of breath. Status post liver cancer surgery. EXAM: CT ABDOMEN AND PELVIS WITH CONTRAST TECHNIQUE: Multidetector CT imaging of the abdomen and  pelvis was performed using the standard protocol following bolus administration of intravenous contrast. CONTRAST:  84m ISOVUE-300 IOPAMIDOL (ISOVUE-300) INJECTION 61% COMPARISON:  Liver ablation CT dated 08/30/2017. Chest CT dated 07/02/2017. Abdomen and pelvis CT dated 07/11/2016. Abdomen and pelvis CT dated 07/01/2012. FINDINGS: Lower chest: Interval moderate-sized right pleural effusion with right lower lobe atelectasis. Hepatobiliary: Stable nodular liver contours with a small right lobe and enlarged lateral segment left lobe and caudate lobe. Interval post ablation changes in the right lobe and lateral segment left lobe. No visible mass separate from these changes at either location. Cholecystectomy clips. Pancreas: Unremarkable. No pancreatic ductal dilatation or surrounding inflammatory changes. Spleen: Diffusely enlarged, without significant change. Adrenals/Urinary Tract: Stable dilatation of the right renal collecting system without ureteral dilatation. A cortical scar in the mid to upper right kidney containing a 6 mm calcification is unchanged. There is also an additional 5 mm right renal calculus more inferiorly without significant change. A 2.0 cm left renal cyst is unchanged. There is also a 1.4 cm oval low density in the medial aspect of the kidney measuring 25 Hounsfield units in density, without significant change since 07/11/2016. Portions of  the urinary bladder obscured by streak artifacts from bilateral hip prostheses with no visible bladder abnormality. No visible ureteral calculi or dilatation. Stomach/Bowel: Mild fat density wall thickening involving the right colon is unchanged. Multiple proximal small bowel loops near the upper limit of normal in size are again demonstrated with normal caliber distal small bowel and normal caliber colon. No evidence of appendicitis. Vascular/Lymphatic: Atheromatous arterial calcifications without aneurysm. No enlarged lymph nodes. Reproductive: Status  post hysterectomy. No adnexal masses. Other: Small to moderate amount of free peritoneal fluid. Diffuse subcutaneous edema. Musculoskeletal: Old, healed right rib fractures. Approximately 30% L1 vertebral compression deformity with kyphoplasty material. Interbody and pedicle screw and rod fixation at the L4 through S1 levels. Lumbar and lower thoracic spine degenerative changes. Bilateral hip prostheses. IMPRESSION: 1. Interval moderate-sized right pleural effusion with right lower lobe atelectasis. 2. Stable changes of cirrhosis of the liver with interval post ablation changes and associated splenomegaly and ascites. 3. Diffuse subcutaneous edema, compatible with hypoproteinemia. 4. Stable chronic right UPJ obstruction and nonobstructing right renal calculus. 5. Stable simple left renal cyst and probable complicated left renal cyst. Electronically Signed   By: Claudie Revering M.D.   On: 09/07/2017 17:17   US Abdomen Limited  Result Date: 09/08/2017 CLINICAL DATA:  History of cirrhosis. Please perform abdominal ultrasound and ultrasound-guided paracentesis as indicated. EXAM: LIMITED ABDOMEN ULTRASOUND FOR ASCITES TECHNIQUE: Limited ultrasound survey for ascites was performed in all four abdominal quadrants. COMPARISON:  CT abdomen pelvis-09/07/2017; CT-guided hepatic lesion microwave ablation - 08/30/2017 FINDINGS: Provided images of the abdomen demonstrates only a trace amount of fluid within the abdomen, too small to allow for safe ultrasound-guided paracentesis. IMPRESSION: Trace amount of intra-abdominal ascites, too small to allow for safe ultrasound-guided paracentesis. Electronically Signed   By: Sandi Mariscal M.D.   On: 09/08/2017 09:38        Scheduled Meds: . allopurinol  300 mg Oral Daily  . aspirin EC  81 mg Oral QHS  . bisoprolol  10 mg Oral Daily  . budesonide (PULMICORT) nebulizer solution  0.25 mg Nebulization BID  . cholecalciferol  1,000 Units Oral Daily  . citalopram  40 mg Oral Q  supper  . ferrous sulfate  325 mg Oral Daily  . furosemide  60 mg Intravenous Q12H  . gabapentin  300 mg Oral BID  . heparin  5,000 Units Subcutaneous Q12H  . ipratropium-albuterol  3 mL Nebulization Q6H  . lactulose  10 g Oral Daily  . lidocaine (PF)      . pantoprazole  40 mg Oral Daily  . potassium chloride  40 mEq Oral BID  . rifampin  300 mg Oral BID WC   Continuous Infusions: . albumin human    . cefTRIAXone (ROCEPHIN)  IV Stopped (09/08/17 0030)     LOS: 1 day    Time spent: 40 Minutes.    Dana Allan, MD  [facility] Pager #: [phone] 7PM-7AM contact night coverage as above

## 2017-09-09 ENCOUNTER — Inpatient Hospital Stay (HOSPITAL_COMMUNITY): Payer: PPO

## 2017-09-09 DIAGNOSIS — I509 Heart failure, unspecified: Secondary | ICD-10-CM

## 2017-09-09 DIAGNOSIS — R5381 Other malaise: Secondary | ICD-10-CM

## 2017-09-09 DIAGNOSIS — R52 Pain, unspecified: Secondary | ICD-10-CM

## 2017-09-09 LAB — RENAL FUNCTION PANEL
Albumin: 2.6 g/dL — ABNORMAL LOW (ref 3.5–5.0)
Anion gap: 11 (ref 5–15)
BUN: 21 mg/dL — ABNORMAL HIGH (ref 6–20)
CO2: 27 mmol/L (ref 22–32)
Calcium: 8.4 mg/dL — ABNORMAL LOW (ref 8.9–10.3)
Chloride: 101 mmol/L (ref 101–111)
Creatinine, Ser: 1.35 mg/dL — ABNORMAL HIGH (ref 0.44–1.00)
GFR calc Af Amer: 45 mL/min — ABNORMAL LOW (ref >60)
GFR calc non Af Amer: 39 mL/min — ABNORMAL LOW (ref >60)
Glucose, Bld: 110 mg/dL — ABNORMAL HIGH (ref 65–99)
Phosphorus: 4.7 mg/dL — ABNORMAL HIGH (ref 2.5–4.6)
Potassium: 4.6 mmol/L (ref 3.5–5.1)
Sodium: 139 mmol/L (ref 135–145)

## 2017-09-09 LAB — ECHOCARDIOGRAM COMPLETE
Height: 62.5 in
Weight: 2977.09 oz

## 2017-09-09 LAB — BLOOD GAS, ARTERIAL
Acid-Base Excess: 7.6 mmol/L — ABNORMAL HIGH (ref 0.0–2.0)
Bicarbonate: 33.2 mmol/L — ABNORMAL HIGH (ref 20.0–28.0)
Drawn by: 519031
O2 Content: 4 L/min
O2 Saturation: 91.2 %
Patient temperature: 98.6
pCO2 arterial: 62.3 mmHg — ABNORMAL HIGH (ref 32.0–48.0)
pH, Arterial: 7.346 — ABNORMAL LOW (ref 7.350–7.450)
pO2, Arterial: 66.4 mmHg — ABNORMAL LOW (ref 83.0–108.0)

## 2017-09-09 LAB — GLUCOSE, CAPILLARY
Glucose-Capillary: 108 mg/dL — ABNORMAL HIGH (ref 65–99)
Glucose-Capillary: 117 mg/dL — ABNORMAL HIGH (ref 65–99)

## 2017-09-09 MED ORDER — MORPHINE SULFATE (PF) 2 MG/ML IV SOLN
2.0000 mg | Freq: Once | INTRAVENOUS | Status: AC
  Administered 2017-09-09: 2 mg via INTRAVENOUS
  Filled 2017-09-09: qty 1

## 2017-09-09 MED ORDER — IPRATROPIUM-ALBUTEROL 0.5-2.5 (3) MG/3ML IN SOLN
3.0000 mL | Freq: Three times a day (TID) | RESPIRATORY_TRACT | Status: DC
  Administered 2017-09-10 – 2017-09-13 (×11): 3 mL via RESPIRATORY_TRACT
  Filled 2017-09-09 (×12): qty 3

## 2017-09-09 MED ORDER — KETOROLAC TROMETHAMINE 15 MG/ML IJ SOLN
7.5000 mg | Freq: Once | INTRAMUSCULAR | Status: AC
  Administered 2017-09-09: 7.5 mg via INTRAVENOUS
  Filled 2017-09-09: qty 1

## 2017-09-09 NOTE — Progress Notes (Signed)
  Echocardiogram 2D Echocardiogram has been performed.  Merrie Roof F 09/09/2017, 4:19 PM

## 2017-09-09 NOTE — Progress Notes (Signed)
PROGRESS NOTE    [name]  BOF:[id number] DOB: [birth year] DOA: 09/07/2017 PCP: Unk Pinto, MD  Outpatient Specialists:   Brief Narrative: The patient is a 72 year old Caucasian female with past medical history significant for hepatocellular carcinoma diagnosed 1-2 months ago status post CT guided ablation of the tumor on 08/30/2017,  Chronic dCHF (2017), splenomegaly, IBS, hypertension, hyperlipidemia, hepatic encephalopathy, esophageal varices, diabetes mellitus, COPD, tobacco use disorder, and history of asthma.  Patient was admitted with anasarca and bilateral lower extremities cellulitis.    Assessment/Plan  1. Acute respiratory failure, likely multifactorial. 2. Likely acute diastolic congestive heart failure with exacerbation. 3. Volume overload, worrisome for mild anasarca. 4. Right pleural effusion. 5. Liver cirrhosis with recent diagnosis of hepatocellular carcinoma. 6. Hepatocellular carcinoma. 7. Status post ablation of hepatocellular masses on 08/30/2017.   8. Mild ascites, not amenable to ultrasound-guided paracentesis.   9. Abdominal pain, likely related to abdominal distention and edema.   10. Pleural effusion. 11. Hypokalemia, resolved.  12. Doubt spontaneous bacterial peritonitis.  13. Doubt acute hepatic failure. 14. Cellulitis of bilateral lower legs. 15. Wheezing, possible related to COPD with exacerbation. 16. Possible COPD with exacerbation. 17. Hypertension. 18. Diabetes mellitus, historical. 19. Hypoalbuminemia, likely multifactorial.     Continue to assess and manage patient.      Ascites is not amenable to ultrasound-guided paracentesis.    Continue IV antibiotics.   IV albumin with paracentesis.  Cautious diuresis.  Continue patient on DuoNeb and pulmicort.  Monitor renal function and electrolytes.  Echocardiogram.  Repeat chest x-ray.   Acute on chronic hypoxic hypercarbic respiratory failure, multifactorial.  -CXR from  admission with suspected RLL infitrates; from home. -cough prior to admission -already on IV ceftriaxone; continue  -on 2L O2 at home chronically  -presented with anasarca and responded well to IV diuretics -09/09/17 ABG revealed elevated CO2 in the 60's suspect obesity hypoventilation vs OSA -recommend follow up with pulmonology after discharge and overnight sleep study to evaluate OSA. -Continue diuresis, but cautiously. -Continue nebulizer treatment.  Suspect acute diastolic congestive heart failure with exacerbation. -Continue diuretics. -Procedure echocardiogram.  Last echo was done in 2017 and revealed grade 1 diastolic dysfunction. -Continue beta-blockers.  Suspected OSA vs obesity hypoventilation -09/09/17 ABG revealed hypercarbia with PCO2 in the 60's -Recommend outpatient evaluation for OSA  Liver cirrhosis with hepatocellular carcinoma. The patient had MRI 2 months ago that revealed 2 liver masses. Status post ablation of the liver masses. Continue with lactulose. Continue to monitor INR. Avoid constipation. Treat hypokalemia.  Ascites. This is mild, not amenable to ultrasound-guided paracentesis. Continue IV diuresis. Continue to monitor.    Right Pleural effusion. Will repeat chest x-ray. We will continue to monitor closely. May be related to volume overload and low albumin.   Hypokalemia, resolved. K+ 4.6 Was having diarrhea. Resolved. The patient is on lactulose chronically for hepatic encephalopathy. The patient is also on diuretics. Cause of hypokalemia is likely secondary to above. Will replete potassium. Resolved.  Possible spontaneous bacterial peritonitis. This is less likely considering amount of ascitic fluid.    Cellulitis of bilateral lower legs/leg edema.. Continue IV antibiotics. Manage volume overload.  Wheezing and possible COPD with exacerbation. Continue round the clock breathing treatments Continue to assess.  Volume  overload/anasarca. Possible multifactorial secondary to heart disease, CKD stage 3, and liver disease.  IV Lasix.  Diabetes mellitus/hypertension/other chronic illness. Manage expectantly.  CKD 3 -cr 1.35 from 1.29 -baseline cr 1.1 -avoid nephrotoxic meds/hypotension/dehydration  Generalized weakness/deconditioning -PT evaluate -fall precaution  DVT prophylaxis subcu heparin.   Code Status: Full Family Communication: husband at bedside Disposition Plan: Home when more stable Consults called: none Admission status: Inpatient.  Consultants:   None.  Procedures:   None.  Antimicrobials:   IV Rocephin started on 09/07/2017.   Subjective: 09/09/17: patient seen and examined with her husband at her bedside. She is somnolent but arouses to voices. No new complaints. Husband reports baseline O2 supplement is 2L continuously, snoring and sometimes stops breathing during her sleep. Follows up with pulmonologist in the outpatient setting. ABG reveals CO2 retention which appears to be chronic. Recommend overnight sleep study outpatient to assess for OSA.    Objective: Vitals:   09/08/17 2042 09/08/17 2043 09/09/17 0135 09/09/17 0457  BP:  (!) 118/50  (!) 117/41  Pulse:  76  84  Resp:  (!) 21  20  Temp:  98 F (36.7 C)  98.2 F (36.8 C)  TempSrc:  Oral  Oral  SpO2: 98% 94% 94% 100%  Weight:  84.4 kg (186 lb 1.1 oz)    Height:        Intake/Output Summary (Last 24 hours) at 09/09/2017 0742 Last data filed at 09/09/2017 0615 Gross per 24 hour  Intake 530 ml  Output 450 ml  Net 80 ml   Filed Weights   09/07/17 2111 09/08/17 2043  Weight: 84.6 kg (186 lb 8.2 oz) 84.4 kg (186 lb 1.1 oz)    Examination: 09/09/17 patient seen and examined:  General exam: Appears calm and comfortable.  Respiratory system: Improved air entry.  Wheezing has improved as well.  Cardiovascular system: S1 & S2 heard, Gastrointestinal system: Abdomen is obese, soft and nontender.  Bowel sounds  are present.   Central nervous system: Alert and oriented. No focal neurological deficits. Extremities: Leg edema has improved.  Cellulitis has improved.  The patient has chronic skin changes of the lower legs.  Data Reviewed: I have personally reviewed following labs and imaging studies  CBC: Recent Labs  Lab 09/03/17 1547 09/07/17 1428 09/07/17 2032 09/08/17 0936  WBC 7.8 6.9 5.7 4.4  NEUTROABS 6,552  --   --   --   HGB 9.5* 9.8* 9.0* 8.1*  HCT 28.1* 30.4* 28.0* 25.5*  MCV 98.6 104.1* 103.7* 105.8*  PLT 71* 124* 110* 767*   Basic Metabolic Panel: Recent Labs  Lab 09/03/17 1547 09/07/17 1428 09/07/17 2032 09/08/17 0936 09/08/17 1107  NA 136 134*  --  139 137  K 5.0 3.3*  --  4.3 3.9  CL 103 99*  --  103 100*  CO2 29 25  --  26 26  GLUCOSE 108* 165*  --  91 114*  BUN 23 19  --  19 20  CREATININE 1.10* 1.26* 1.27* 1.26* 1.29*  CALCIUM 8.3* 8.5*  --  8.6* 8.4*  MG 2.0  --  2.0  --  2.0  PHOS  --   --  3.7  --  3.8   GFR: Estimated Creatinine Clearance: 41.3 mL/min (A) (by C-G formula based on SCr of 1.29 mg/dL (H)). Liver Function Tests: Recent Labs  Lab 09/03/17 1547 09/07/17 1428 09/07/17 2032 09/08/17 0936 09/08/17 1107  AST 66* 30  --  33 29  ALT 121* 51  --  35 37  ALKPHOS  --  145*  --  129* 131*  BILITOT 1.8* 1.6*  --  1.6* 1.6*  PROT 4.8* 5.3*  --  4.7* 4.8*  ALBUMIN  --  2.7* 2.5* 2.6* 2.7*  Recent Labs  Lab 09/07/17 1428  LIPASE 84*   Recent Labs  Lab 09/03/17 1547 09/07/17 2032  AMMONIA 67 18   Coagulation Profile: Recent Labs  Lab 09/07/17 1428 09/08/17 0936  INR 1.17 1.29   Cardiac Enzymes: No results for input(s): CKTOTAL, CKMB, CKMBINDEX, TROPONINI in the last 168 hours. BNP (last 3 results) No results for input(s): PROBNP in the last 8760 hours. HbA1C: No results for input(s): HGBA1C in the last 72 hours. CBG: Recent Labs  Lab 09/07/17 2015 09/08/17 0757 09/08/17 1204 09/08/17 1639 09/08/17 2039  GLUCAP 102* 110*  141* 135* 126*   Lipid Profile: No results for input(s): CHOL, HDL, LDLCALC, TRIG, CHOLHDL, LDLDIRECT in the last 72 hours. Thyroid Function Tests: No results for input(s): TSH, T4TOTAL, FREET4, T3FREE, THYROIDAB in the last 72 hours. Anemia Panel: No results for input(s): VITAMINB12, FOLATE, FERRITIN, TIBC, IRON, RETICCTPCT in the last 72 hours. Urine analysis:    Component Value Date/Time   COLORURINE YELLOW 09/07/2017 2036   APPEARANCEUR CLEAR 09/07/2017 2036   LABSPEC 1.015 09/07/2017 2036   PHURINE 6.0 09/07/2017 2036   GLUCOSEU NEGATIVE 09/07/2017 2036   HGBUR SMALL (A) 09/07/2017 2036   BILIRUBINUR NEGATIVE 09/07/2017 2036   KETONESUR NEGATIVE 09/07/2017 2036   PROTEINUR NEGATIVE 09/07/2017 2036   UROBILINOGEN 1 01/13/2015 1145   NITRITE NEGATIVE 09/07/2017 2036   LEUKOCYTESUR NEGATIVE 09/07/2017 2036   Sepsis Labs: @LABRCNTIP (procalcitonin:4,lacticidven:4)  )No results found for this or any previous visit (from the past 240 hour(s)).       Radiology Studies: Dg Chest 2 View  Result Date: 09/07/2017 CLINICAL DATA:  Chest pain EXAM: CHEST  2 VIEW COMPARISON:  08/30/2017 FINDINGS: Small to moderate layering right pleural effusion. Left lung is clear. No frank interstitial edema. No pneumothorax. The heart is normal in size. Mild degenerative changes of the visualized thoracolumbar spine. L1 vertebral augmentation. Cervical spine fixation hardware. IMPRESSION: Small to moderate layering right pleural effusion, new. Electronically Signed   By: Julian Hy M.D.   On: 09/07/2017 15:49   Ct Abdomen Pelvis W Contrast  Result Date: 09/07/2017 CLINICAL DATA:  Abdominal pain, swelling and shortness of breath. Status post liver cancer surgery. EXAM: CT ABDOMEN AND PELVIS WITH CONTRAST TECHNIQUE: Multidetector CT imaging of the abdomen and pelvis was performed using the standard protocol following bolus administration of intravenous contrast. CONTRAST:  42m ISOVUE-300 IOPAMIDOL  (ISOVUE-300) INJECTION 61% COMPARISON:  Liver ablation CT dated 08/30/2017. Chest CT dated 07/02/2017. Abdomen and pelvis CT dated 07/11/2016. Abdomen and pelvis CT dated 07/01/2012. FINDINGS: Lower chest: Interval moderate-sized right pleural effusion with right lower lobe atelectasis. Hepatobiliary: Stable nodular liver contours with a small right lobe and enlarged lateral segment left lobe and caudate lobe. Interval post ablation changes in the right lobe and lateral segment left lobe. No visible mass separate from these changes at either location. Cholecystectomy clips. Pancreas: Unremarkable. No pancreatic ductal dilatation or surrounding inflammatory changes. Spleen: Diffusely enlarged, without significant change. Adrenals/Urinary Tract: Stable dilatation of the right renal collecting system without ureteral dilatation. A cortical scar in the mid to upper right kidney containing a 6 mm calcification is unchanged. There is also an additional 5 mm right renal calculus more inferiorly without significant change. A 2.0 cm left renal cyst is unchanged. There is also a 1.4 cm oval low density in the medial aspect of the kidney measuring 25 Hounsfield units in density, without significant change since 07/11/2016. Portions of the urinary bladder obscured by streak artifacts from bilateral hip  prostheses with no visible bladder abnormality. No visible ureteral calculi or dilatation. Stomach/Bowel: Mild fat density wall thickening involving the right colon is unchanged. Multiple proximal small bowel loops near the upper limit of normal in size are again demonstrated with normal caliber distal small bowel and normal caliber colon. No evidence of appendicitis. Vascular/Lymphatic: Atheromatous arterial calcifications without aneurysm. No enlarged lymph nodes. Reproductive: Status post hysterectomy. No adnexal masses. Other: Small to moderate amount of free peritoneal fluid. Diffuse subcutaneous edema. Musculoskeletal: Old,  healed right rib fractures. Approximately 30% L1 vertebral compression deformity with kyphoplasty material. Interbody and pedicle screw and rod fixation at the L4 through S1 levels. Lumbar and lower thoracic spine degenerative changes. Bilateral hip prostheses. IMPRESSION: 1. Interval moderate-sized right pleural effusion with right lower lobe atelectasis. 2. Stable changes of cirrhosis of the liver with interval post ablation changes and associated splenomegaly and ascites. 3. Diffuse subcutaneous edema, compatible with hypoproteinemia. 4. Stable chronic right UPJ obstruction and nonobstructing right renal calculus. 5. Stable simple left renal cyst and probable complicated left renal cyst. Electronically Signed   By: Claudie Revering M.D.   On: 09/07/2017 17:17   US Abdomen Limited  Result Date: 09/08/2017 CLINICAL DATA:  History of cirrhosis. Please perform abdominal ultrasound and ultrasound-guided paracentesis as indicated. EXAM: LIMITED ABDOMEN ULTRASOUND FOR ASCITES TECHNIQUE: Limited ultrasound survey for ascites was performed in all four abdominal quadrants. COMPARISON:  CT abdomen pelvis-09/07/2017; CT-guided hepatic lesion microwave ablation - 08/30/2017 FINDINGS: Provided images of the abdomen demonstrates only a trace amount of fluid within the abdomen, too small to allow for safe ultrasound-guided paracentesis. IMPRESSION: Trace amount of intra-abdominal ascites, too small to allow for safe ultrasound-guided paracentesis. Electronically Signed   By: Sandi Mariscal M.D.   On: 09/08/2017 09:38   Dg Abd Portable 1v  Result Date: 09/09/2017 CLINICAL DATA:  Abdominal pain EXAM: PORTABLE ABDOMEN - 1 VIEW COMPARISON:  09/07/2017 FINDINGS: Scattered large and small bowel gas is noted. Postsurgical changes in the hips and lumbar spine are noted. Changes of prior vertebral augmentation are again seen. No free air is noted. No obstructive changes are noted. IMPRESSION: No acute abnormality noted. Electronically  Signed   By: Inez Catalina M.D.   On: 09/09/2017 07:21        Scheduled Meds: . allopurinol  300 mg Oral Daily  . aspirin EC  81 mg Oral QHS  . bisoprolol  10 mg Oral Daily  . budesonide (PULMICORT) nebulizer solution  0.25 mg Nebulization BID  . cholecalciferol  1,000 Units Oral Daily  . citalopram  40 mg Oral Q supper  . ferrous sulfate  325 mg Oral Daily  . furosemide  60 mg Intravenous Q12H  . gabapentin  300 mg Oral BID  . heparin  5,000 Units Subcutaneous Q12H  . ipratropium-albuterol  3 mL Nebulization Q6H  . lactulose  10 g Oral Daily  . pantoprazole  40 mg Oral Daily  . potassium chloride  40 mEq Oral BID  . rifampin  300 mg Oral BID WC   Continuous Infusions: . albumin human    . cefTRIAXone (ROCEPHIN)  IV Stopped (09/08/17 2300)     LOS: 2 days    Time spent: 40 Minutes.    Irene Pap, MD  [facility] Pager #: [phone] 7PM-7AM contact night coverage as above

## 2017-09-09 NOTE — Progress Notes (Signed)
At approximately 0400, patient c/o severe upper abdominal pain, stabbing in nature, and radiating to back.  She was also complaining of nausea.  VS stable.  [facility] called.  Orders received for 2 mg IV Morphine which was given at 0433.  Zofran given at 0433.  Upon reassessment, patient is asleep.  Waiting on abdominal xray portable to be done.  Will continue to monitor patient.  Earleen Reaper RN-BC, [employer]

## 2017-09-10 ENCOUNTER — Inpatient Hospital Stay (HOSPITAL_COMMUNITY): Payer: PPO

## 2017-09-10 DIAGNOSIS — I5033 Acute on chronic diastolic (congestive) heart failure: Secondary | ICD-10-CM

## 2017-09-10 DIAGNOSIS — J9622 Acute and chronic respiratory failure with hypercapnia: Secondary | ICD-10-CM

## 2017-09-10 DIAGNOSIS — J9621 Acute and chronic respiratory failure with hypoxia: Secondary | ICD-10-CM

## 2017-09-10 DIAGNOSIS — J441 Chronic obstructive pulmonary disease with (acute) exacerbation: Secondary | ICD-10-CM

## 2017-09-10 LAB — GLUCOSE, CAPILLARY
Glucose-Capillary: 109 mg/dL — ABNORMAL HIGH (ref 65–99)
Glucose-Capillary: 116 mg/dL — ABNORMAL HIGH (ref 65–99)
Glucose-Capillary: 198 mg/dL — ABNORMAL HIGH (ref 65–99)

## 2017-09-10 MED ORDER — TORSEMIDE 20 MG PO TABS
30.0000 mg | ORAL_TABLET | Freq: Two times a day (BID) | ORAL | Status: DC
  Administered 2017-09-10 – 2017-09-12 (×4): 30 mg via ORAL
  Filled 2017-09-10 (×4): qty 2

## 2017-09-10 MED ORDER — OXYCODONE HCL 5 MG PO TABS
5.0000 mg | ORAL_TABLET | Freq: Once | ORAL | Status: AC
  Administered 2017-09-10: 5 mg via ORAL
  Filled 2017-09-10: qty 1

## 2017-09-10 MED ORDER — METHYLPREDNISOLONE SODIUM SUCC 40 MG IJ SOLR
40.0000 mg | Freq: Three times a day (TID) | INTRAMUSCULAR | Status: DC
  Administered 2017-09-10 – 2017-09-11 (×3): 40 mg via INTRAVENOUS
  Filled 2017-09-10 (×3): qty 1

## 2017-09-10 MED ORDER — METOCLOPRAMIDE HCL 5 MG/ML IJ SOLN
5.0000 mg | Freq: Four times a day (QID) | INTRAMUSCULAR | Status: AC
  Administered 2017-09-10 – 2017-09-11 (×3): 5 mg via INTRAVENOUS
  Filled 2017-09-10 (×3): qty 2

## 2017-09-10 MED ORDER — INSULIN ASPART 100 UNIT/ML ~~LOC~~ SOLN
0.0000 [IU] | Freq: Three times a day (TID) | SUBCUTANEOUS | Status: DC
  Administered 2017-09-11: 1 [IU] via SUBCUTANEOUS
  Administered 2017-09-11 – 2017-09-13 (×5): 2 [IU] via SUBCUTANEOUS

## 2017-09-10 NOTE — Progress Notes (Signed)
PROGRESS NOTE    [name]  TGY:[id number] DOB: 1945-09-11 DOA: 09/07/2017 PCP: Unk Pinto, MD  Outpatient Specialists:   Brief Narrative: The patient is a 72 year old Caucasian female with past medical history significant for hepatocellular carcinoma diagnosed 1-2 months ago status post CT guided ablation of the tumor on 08/30/2017,  Chronic dCHF (2017), splenomegaly, IBS, hypertension, hyperlipidemia, hepatic encephalopathy, esophageal varices, diabetes mellitus, COPD, tobacco use disorder, likely undiagnosed OSA and obesity hypoventilation syndrome, and documented history of asthma.  Patient was admitted with anasarca and bilateral lower extremities cellulitis.  Echo done has revealed grade 2 diastolic dysfunction, with normal ejection fraction.  09/10/2017 -patient remains short winded.  Patient's lungs remain tight, with decreased air entry and expiratory wheeze.  Patient has reluctantly agreed to be started on IV Solu-Medrol.  Will monitor blood sugar closely and start patient on SSI NovoLog if indicated.  Patient seems to have borderline diabetes mellitus as per patient's husband.  Will discontinue IV Lasix.  The patient was on p.o. Lasix 80 mg p.o. in the morning of 40 mg p.o. in the afternoon.  Considering pharmacokinetics, I will start patient on torsemide 30 mg p.o. twice daily.  The patient will need cardiology follow-up on discharge.  Cautious use of beta-blockers as the patient has significant respiratory symptoms.  Hopefully, the patient will be discharged in the next 24-48 hours.  No fever or chills, no chest pain, no headache, no neck pain, no nausea or other GI symptoms reported.  Cellulitis has improved significantly.  Assessment/Plan  1. Acute respiratory failure, likely multifactorial. 2. acute on chronic diastolic congestive heart failure with exacerbation. 3. Volume overload, worrisome for mild anasarca. 4. Right pleural effusion. 5. Liver cirrhosis with recent  diagnosis of hepatocellular carcinoma. 6. Hepatocellular carcinoma. 7. Status post ablation of hepatocellular masses on 08/30/2017.   8. Mild ascites, not amenable to ultrasound-guided paracentesis.   9. Abdominal pain, likely related to abdominal distention and edema.  10. Rule out ileus versus gastroparesis. 11. Pleural effusion. 12. Hypokalemia, resolved.  13. Doubt spontaneous bacterial peritonitis.  14. Doubt acute hepatic failure. 15. Cellulitis of bilateral lower legs. 39. COPD with exacerbation.   17. Hypertension. 18. Diabetes mellitus, historical. 19. Hypoalbuminemia, likely multifactorial.     Continue to assess and manage patient.      Ascites is not amenable to ultrasound-guided paracentesis.    Continue IV antibiotics.  Change IV Lasix to torsemide 30 mg p.o. twice daily.  Continue patient on DuoNeb and pulmicort.  Start patient on IV Solu-Medrol 40 mg every 8 hourly.  Accu-Chek before meals at bedtime and cover with low-dose SSI NovoLog  Monitor renal function and electrolytes.  Repeat chest x-ray.  Possible DC in 24-48 hours if pulmonary symptoms improve  Cardiology follow-up upon discharge.  Sleep study workup on discharge  Cautious use of beta-blockers   Acute on chronic hypoxic hypercarbic respiratory failure, multifactorial. -Patient has COPD with exacerbation.  Patient may also have OSA and OHS. -Start patient on IV Solu-Medrol every 8 hours -Likely discharge in 24-48 hours if the pulmonary symptoms improve -Repeat CXR. -cough and wheezing on admission  -Continue IV ceftriaxone. -on 2L O2 at home chronically  -presented with anasarca and responded well to IV diuretics -09/09/17 ABG revealed elevated CO2 in the 60's suspect obesity hypoventilation vs OSA -recommend follow up with pulmonology after discharge and overnight sleep study to evaluate OSA. -Change diuretics to oral torsemide 30 mg twice daily  -Continue nebulizer  treatment.  Acute on chronic diastolic  congestive heart failure with exacerbation. -Continue diuretics. -Echo reveals normal EF with grade 2 diastolic dysfunction.   -Continue beta-blockers, Beth cautiously as this patient has significant respiratory symptoms.  Suspected OSA vs obesity hypoventilation -09/09/17 ABG revealed hypercarbia with PCO2 in the 60's -Recommend outpatient evaluation for OSA  Liver cirrhosis with hepatocellular carcinoma. The patient had MRI 2 months ago that revealed 2 liver masses. Status post ablation of the liver masses. Continue with lactulose. Continue to monitor INR. Avoid constipation. Treat hypokalemia.  Ascites. This is mild, not amenable to ultrasound-guided paracentesis. Continue IV diuresis. Continue to monitor.    Right Pleural effusion. Will repeat chest x-ray. We will continue to monitor closely. May be related to volume overload and low albumin.   Hypokalemia, resolved. K+ 4.6 on 09/09/2017. Repeat renal panel today. Was having diarrhea. Resolved. The patient is on lactulose chronically for hepatic encephalopathy. The patient is also on diuretics. Cause of hypokalemia is likely secondary to above. Resolved.  Possible spontaneous bacterial peritonitis. This is less likely.  Cellulitis of bilateral lower legs/leg edema.. Continue IV antibiotics. Manage volume overload.  COPD with exacerbation. Continue round the clock breathing treatments Continue neb treatment. Start patient on IV Solu-Medrol.  Volume overload/anasarca. Likely multifactorial secondary to heart disease, CKD stage 3, and liver disease.  Change IV Lasix to oral torsemide.  Likely DC home on torsemide.  Diabetes mellitus/hypertension/other chronic illness. Manage expectantly.  CKD 3 -cr 1.35 from 1.29 on 09/09/2017. -baseline cr 1.1 -avoid nephrotoxic meds/hypotension/dehydration  Generalized weakness/deconditioning -PT evaluate -fall  precaution  Possible diabetic gastroparesis versus ileus. Start patient on IV Reglan. Continue to monitor QTc interval.   DVT prophylaxis subcu heparin.   Code Status: Full Family Communication: husband at bedside Disposition Plan: Home when more stable Consults called: none Admission status: Inpatient.  Consultants:   None.  Procedures:   None.  Antimicrobials:   IV Rocephin started on 09/07/2017.   Subjective: Patient is still short winded.  Edema is improving.  Abdomen is still mildly distended.  The patient has wheezing.  Objective: Vitals:   09/09/17 2126 09/10/17 0502 09/10/17 0829 09/10/17 0900  BP: (!) 123/50 (!) 121/45 (!) 132/50   Pulse: 83 78 76   Resp: 18 18 18    Temp: 98.1 F (36.7 C) 98.2 F (36.8 C) 98.1 F (36.7 C)   TempSrc: Oral Oral Oral   SpO2: 95% 90% 92% 91%  Weight: 84.5 kg (186 lb 4.6 oz)     Height:        Intake/Output Summary (Last 24 hours) at 09/10/2017 1111 Last data filed at 09/10/2017 0503 Gross per 24 hour  Intake 390 ml  Output 900 ml  Net -510 ml   Filed Weights   09/07/17 2111 09/08/17 2043 09/09/17 2126  Weight: 84.6 kg (186 lb 8.2 oz) 84.4 kg (186 lb 1.1 oz) 84.5 kg (186 lb 4.6 oz)    Examination: 09/09/17 patient seen and examined:  General exam: Appears calm and comfortable.  Respiratory system: Decreased air entry with expiratory wheeze.   Cardiovascular system: S1 & S2 heard, Gastrointestinal system: Abdomen is obese, soft and nontender.  Bowel sounds are present.   Central nervous system: Alert and oriented. No focal neurological deficits. Extremities: Leg edema is improving.  Cellulitis has improved.  The patient has chronic skin changes of the lower legs.  Data Reviewed: I have personally reviewed following labs and imaging studies  CBC: Recent Labs  Lab 09/03/17 1547 09/07/17 1428 09/07/17 2032 09/08/17 0936  WBC 7.8  6.9 5.7 4.4  NEUTROABS 6,552  --   --   --   HGB 9.5* 9.8* 9.0* 8.1*  HCT 28.1*  30.4* 28.0* 25.5*  MCV 98.6 104.1* 103.7* 105.8*  PLT 71* 124* 110* 782*   Basic Metabolic Panel: Recent Labs  Lab 09/03/17 1547 09/07/17 1428 09/07/17 2032 09/08/17 0936 09/08/17 1107 09/09/17 0712  NA 136 134*  --  139 137 139  K 5.0 3.3*  --  4.3 3.9 4.6  CL 103 99*  --  103 100* 101  CO2 29 25  --  26 26 27   GLUCOSE 108* 165*  --  91 114* 110*  BUN 23 19  --  19 20 21*  CREATININE 1.10* 1.26* 1.27* 1.26* 1.29* 1.35*  CALCIUM 8.3* 8.5*  --  8.6* 8.4* 8.4*  MG 2.0  --  2.0  --  2.0  --   PHOS  --   --  3.7  --  3.8 4.7*   GFR: Estimated Creatinine Clearance: 39.5 mL/min (A) (by C-G formula based on SCr of 1.35 mg/dL (H)). Liver Function Tests: Recent Labs  Lab 09/03/17 1547 09/07/17 1428 09/07/17 2032 09/08/17 0936 09/08/17 1107 09/09/17 0712  AST 66* 30  --  33 29  --   ALT 121* 51  --  35 37  --   ALKPHOS  --  145*  --  129* 131*  --   BILITOT 1.8* 1.6*  --  1.6* 1.6*  --   PROT 4.8* 5.3*  --  4.7* 4.8*  --   ALBUMIN  --  2.7* 2.5* 2.6* 2.7* 2.6*   Recent Labs  Lab 09/07/17 1428  LIPASE 84*   Recent Labs  Lab 09/03/17 1547 09/07/17 2032  AMMONIA 67 18   Coagulation Profile: Recent Labs  Lab 09/07/17 1428 09/08/17 0936  INR 1.17 1.29   Cardiac Enzymes: No results for input(s): CKTOTAL, CKMB, CKMBINDEX, TROPONINI in the last 168 hours. BNP (last 3 results) No results for input(s): PROBNP in the last 8760 hours. HbA1C: No results for input(s): HGBA1C in the last 72 hours. CBG: Recent Labs  Lab 09/08/17 1204 09/08/17 1639 09/08/17 2039 09/09/17 0750 09/09/17 1154  GLUCAP 141* 135* 126* 108* 117*   Lipid Profile: No results for input(s): CHOL, HDL, LDLCALC, TRIG, CHOLHDL, LDLDIRECT in the last 72 hours. Thyroid Function Tests: No results for input(s): TSH, T4TOTAL, FREET4, T3FREE, THYROIDAB in the last 72 hours. Anemia Panel: No results for input(s): VITAMINB12, FOLATE, FERRITIN, TIBC, IRON, RETICCTPCT in the last 72 hours. Urine  analysis:    Component Value Date/Time   COLORURINE YELLOW 09/07/2017 2036   APPEARANCEUR CLEAR 09/07/2017 2036   LABSPEC 1.015 09/07/2017 2036   PHURINE 6.0 09/07/2017 2036   GLUCOSEU NEGATIVE 09/07/2017 2036   HGBUR SMALL (A) 09/07/2017 2036   BILIRUBINUR NEGATIVE 09/07/2017 2036   KETONESUR NEGATIVE 09/07/2017 2036   PROTEINUR NEGATIVE 09/07/2017 2036   UROBILINOGEN 1 01/13/2015 1145   NITRITE NEGATIVE 09/07/2017 2036   LEUKOCYTESUR NEGATIVE 09/07/2017 2036   Sepsis Labs: @LABRCNTIP (procalcitonin:4,lacticidven:4)  )No results found for this or any previous visit (from the past 240 hour(s)).       Radiology Studies: Dg Abd Portable 1v  Result Date: 09/09/2017 CLINICAL DATA:  Abdominal pain EXAM: PORTABLE ABDOMEN - 1 VIEW COMPARISON:  09/07/2017 FINDINGS: Scattered large and small bowel gas is noted. Postsurgical changes in the hips and lumbar spine are noted. Changes of prior vertebral augmentation are again seen. No free air is noted. No  obstructive changes are noted. IMPRESSION: No acute abnormality noted. Electronically Signed   By: Inez Catalina M.D.   On: 09/09/2017 07:21        Scheduled Meds: . allopurinol  300 mg Oral Daily  . aspirin EC  81 mg Oral QHS  . bisoprolol  10 mg Oral Daily  . budesonide (PULMICORT) nebulizer solution  0.25 mg Nebulization BID  . cholecalciferol  1,000 Units Oral Daily  . citalopram  40 mg Oral Q supper  . ferrous sulfate  325 mg Oral Daily  . gabapentin  300 mg Oral BID  . heparin  5,000 Units Subcutaneous Q12H  . insulin aspart  0-9 Units Subcutaneous TID WC  . ipratropium-albuterol  3 mL Nebulization TID  . lactulose  10 g Oral Daily  . methylPREDNISolone (SOLU-MEDROL) injection  40 mg Intravenous Q8H  . pantoprazole  40 mg Oral Daily  . rifampin  300 mg Oral BID WC  . torsemide  30 mg Oral BID   Continuous Infusions: . cefTRIAXone (ROCEPHIN)  IV Stopped (09/09/17 2353)     LOS: 3 days    Time spent: 40  Minutes.    Dana Allan, MD  [facility] Pager #: [phone] 7PM-7AM contact night coverage as above

## 2017-09-10 NOTE — Evaluation (Signed)
Physical Therapy Evaluation Patient Details Name: [name] MRN: [id number] DOB: May 04, 1946 Today's Date: 09/10/2017   History of Present Illness  72 y.o. female admitted for abdominal distention, SOB, and chest pain. PMH includes COPD (on 2 L O2 at home), HTN, HLD, DM, liver CA, hepatic encephalopathy, esophageal varices, and IBS.   Clinical Impression  Pt presents with overall decrease in functional mobility secondary to above. She demonstrates generalized weakness and decreased activity tolerance due to increased WOB. Pt SpO2 84% on 4L O2. Pt >90% on 6L in sitting, so decreased to 4L (she is on 2L O2 at home). Coached pt on breathing techniques. Pt min assist for bed mobility, transfers, and ambulation with RW for safety. Recommend HHPT if pt's spouse is able to provide 24/7 supervision, otherwise pt may require SNF. Pt to benefit from continued acute PT to maximize safety, functional mobility, and independence prior to d/c home.      Follow Up Recommendations Home health PT;Supervision/Assistance - 24 hour(if husband able to provide 24/7 care, otherwise SNF)    Equipment Recommendations  None recommended by PT   Recommendations for Other Services       Precautions / Restrictions Precautions Precautions: Fall Restrictions Weight Bearing Restrictions: No      Mobility  Bed Mobility Overal bed mobility: Needs Assistance Bed Mobility: Supine to Sit     Supine to sit: HOB elevated;Min assist     General bed mobility comments: min A for boost to sit at EOB  Transfers Overall transfer level: Needs assistance Equipment used: Rolling walker (2 wheeled) Transfers: Sit to/from Stand Sit to Stand: Min assist         General transfer comment: min assist for safety, VCs for safety and hand placement  Ambulation/Gait Ambulation/Gait assistance: Min assist Ambulation Distance (Feet): 3 Feet Assistive device: Rolling walker (2 wheeled) Gait Pattern/deviations: Step-to  pattern;Trunk flexed Gait velocity: decreased Gait velocity interpretation: Below normal speed for age/gender General Gait Details: slow with increased WOB during ambulation, increased reliance on bil UEs  Stairs            Wheelchair Mobility    Modified Rankin (Stroke Patients Only)       Balance Overall balance assessment: Needs assistance Sitting-balance support: Feet supported Sitting balance-Leahy Scale: Fair     Standing balance support: Bilateral upper extremity supported Standing balance-Leahy Scale: Poor Standing balance comment: static standing bil UE on RW                             Pertinent Vitals/Pain Pain Assessment: Faces Faces Pain Scale: Hurts even more Pain Location: chest("it hurts to breathe") Pain Intervention(s): Monitored during session;Limited activity within patient's tolerance;Repositioned    Home Living Family/patient expects to be discharged to:: Private residence Living Arrangements: Spouse/significant other Available Help at Discharge: Family Type of Home: House Home Access: Stairs to enter Entrance Stairs-Rails: None Entrance Stairs-Number of Steps: 1 Home Layout: One level Home Equipment: Environmental consultant - 2 wheels;Shower seat      Prior Function Level of Independence: Needs assistance   Gait / Transfers Assistance Needed: ambulates mod I with RW, increased WOB at time around home  ADL's / Homemaking Assistance Needed: husband helps with some ADLs including showering, IADLs  Comments: RW for household distances     Hand Dominance   Dominant Hand: Right    Extremity/Trunk Assessment   Upper Extremity Assessment Upper Extremity Assessment: Defer to OT evaluation  Lower Extremity Assessment Lower Extremity Assessment: Generalized weakness(bil hemosiderin staining)       Communication   Communication: (has hearing aids)  Cognition Arousal/Alertness: Awake/alert Behavior During Therapy: Flat affect Overall  Cognitive Status: Within Functional Limits for tasks assessed                                        General Comments General comments (skin integrity, edema, etc.): increased WOB throughout session, fuctional mobility throuhgout session including transfer bed to [facility] for BM, then transfer and ambulation to recliner chair    Exercises     Assessment/Plan    PT Assessment Patient needs continued PT services  PT Problem List Decreased activity tolerance;Decreased strength;Decreased mobility;Decreased balance;Decreased safety awareness;Decreased knowledge of use of DME;Cardiopulmonary status limiting activity;Decreased skin integrity;Pain       PT Treatment Interventions      PT Goals (Current goals can be found in the Care Plan section)  Acute Rehab PT Goals Patient Stated Goal: to go home PT Goal Formulation: With patient Time For Goal Achievement: 09/24/17 Potential to Achieve Goals: Fair    Frequency Min 3X/week   Barriers to discharge Inaccessible home environment(1-2 steps to enter home)      Co-evaluation               AM-PAC PT "6 Clicks" Daily Activity  Outcome Measure Difficulty turning over in bed (including adjusting bedclothes, sheets and blankets)?: Unable Difficulty moving from lying on back to sitting on the side of the bed? : Unable Difficulty sitting down on and standing up from a chair with arms (e.g., wheelchair, bedside commode, etc,.)?: Unable Help needed moving to and from a bed to chair (including a wheelchair)?: A Little Help needed walking in hospital room?: A Little Help needed climbing 3-5 steps with a railing? : Total 6 Click Score: 10    End of Session Equipment Utilized During Treatment: Gait belt;Oxygen(4-6 L O2 [place]) Activity Tolerance: Treatment limited secondary to medical complications (Comment)(limited by decreased SpO2 and WOB) Patient left: in chair;with call bell/phone within reach;with nursing/sitter in  room Nurse Communication: Mobility status PT Visit Diagnosis: Other abnormalities of gait and mobility (R26.89);Unsteadiness on feet (R26.81);Muscle weakness (generalized) (M62.81)    Time: 7276-1848 PT Time Calculation (min) (ACUTE ONLY): 25 min   Charges:   PT Evaluation $PT Eval Moderate Complexity: 1 Mod PT Treatments $Therapeutic Activity: 8-22 mins   PT G Codes:        Vic Ripper, SPT  Vic Ripper 09/10/2017, 9:57 AM

## 2017-09-10 NOTE — Care Management Note (Signed)
Case Management Note  Patient Details  Name: [name] MRN: [id number] Date of Birth: Oct 29, 1945  Subjective/Objective:                 Spoke to patient at bedside. Patient is from home w spouse. Admitted with SOB, edema, Ascites, COPD. Spouse drives to MD, pharmacy etc. Patient states she has neb, RW, electric scooter, and O2 through [facility]. Portable O2 at bedside. Patient would like to use [facility] for [facility]. Referral placed to [facility]. Will need HH orders.    Action/Plan:   Expected Discharge Date:  09/13/17               Expected Discharge Plan:  [place]  In-House Referral:     Discharge planning Services  CM Consult  Post Acute Care Choice:  Home Health Choice offered to:  Patient  DME Arranged:    DME Agency:     HH Arranged:    [place] Agency:  [facility]  Status of Service:  In process, will continue to follow  If discussed at Long Length of Stay Meetings, dates discussed:    Additional Comments:  Carles Collet, RN 09/10/2017, 10:53 AM

## 2017-09-11 DIAGNOSIS — I5031 Acute diastolic (congestive) heart failure: Secondary | ICD-10-CM | POA: Clinically undetermined

## 2017-09-11 DIAGNOSIS — J90 Pleural effusion, not elsewhere classified: Secondary | ICD-10-CM | POA: Clinically undetermined

## 2017-09-11 DIAGNOSIS — J449 Chronic obstructive pulmonary disease, unspecified: Secondary | ICD-10-CM

## 2017-09-11 DIAGNOSIS — C220 Liver cell carcinoma: Secondary | ICD-10-CM

## 2017-09-11 DIAGNOSIS — R601 Generalized edema: Secondary | ICD-10-CM

## 2017-09-11 LAB — BASIC METABOLIC PANEL
ANION GAP: 13 (ref 5–15)
BUN: 28 mg/dL — ABNORMAL HIGH (ref 6–20)
CALCIUM: 8.9 mg/dL (ref 8.9–10.3)
CO2: 27 mmol/L (ref 22–32)
CREATININE: 1.29 mg/dL — AB (ref 0.44–1.00)
Chloride: 98 mmol/L — ABNORMAL LOW (ref 101–111)
GFR calc Af Amer: 47 mL/min — ABNORMAL LOW (ref >60)
GFR, EST NON AFRICAN AMERICAN: 41 mL/min — AB (ref >60)
Glucose, Bld: 161 mg/dL — ABNORMAL HIGH (ref 65–99)
Potassium: 4.5 mmol/L (ref 3.5–5.1)
SODIUM: 138 mmol/L (ref 135–145)

## 2017-09-11 LAB — GLUCOSE, CAPILLARY
Glucose-Capillary: 125 mg/dL — ABNORMAL HIGH (ref 65–99)
Glucose-Capillary: 161 mg/dL — ABNORMAL HIGH (ref 65–99)
Glucose-Capillary: 163 mg/dL — ABNORMAL HIGH (ref 65–99)
Glucose-Capillary: 127 mg/dL — ABNORMAL HIGH (ref 65–99)

## 2017-09-11 MED ORDER — PREDNISONE 20 MG PO TABS
40.0000 mg | ORAL_TABLET | Freq: Every day | ORAL | Status: DC
  Administered 2017-09-11 – 2017-09-13 (×3): 40 mg via ORAL
  Filled 2017-09-11 (×3): qty 2

## 2017-09-11 MED ORDER — AMOXICILLIN-POT CLAVULANATE 875-125 MG PO TABS
1.0000 | ORAL_TABLET | Freq: Two times a day (BID) | ORAL | Status: DC
  Administered 2017-09-11 – 2017-09-13 (×5): 1 via ORAL
  Filled 2017-09-11 (×5): qty 1

## 2017-09-11 NOTE — Progress Notes (Signed)
PROGRESS NOTE  Kerry ARDITO QIH:[id number] DOB: 04-27-1946 DOA: 09/07/2017 PCP: Unk Pinto, MD  HPI/Recap of past 24 hours:  [name] is a 72 y.o. year old female with medical history significant for liver cirrhosis, hepatocellular carcinoma, COPD, on chronic lactulose for hepatic encephalopathy who presented on 09/07/2017 with worsening abdominal swelling and shortness of breath and was found to have acute on chronic hypoxic respiratory failure multifactorial etiology.  Subjective  This morning, states she would like to go home.  Understands she needs to have improvement in her breathing.  Denies any chest pain, states that her swelling has improved somewhat.  Assessment/Plan: Active Problems:   Hepatic cirrhosis (HCC)   Essential hypertension   Hyperlipidemia   Venous insufficiency   Acute on chronic respiratory failure with hypoxemia (HCC)   COPD mixed type (HCC)   Anasarca   Hepatocellular carcinoma (HCC)   Ascites   Acute diastolic CHF (congestive heart failure) (HCC)   Pleural effusion on right    Acute on chronic hypoxic/hypercarbic respiratory failure, multifactorial etiology (COPD exacerbation, CHF exacerbation, OSA/OHS, pNA) Still requiring 4 L of oxygen, increasing home regimen of 2 L.  Still significant wheezing on exam.  Continue scheduled inhalers, de-escalate from IV steroids to oral prednisone.  De-escalate from IV ceftriaxone to Augmentin for potential pneumonia in addition to treatment for potential COPD exacerbation.  Given persistent oxygen requirements and chest x-ray showing enlarging right-sided pleural effusion believe patient will benefit from diagnostic/therapeutic thoracentesis  COPD exacerbation, unclear if improving Continue scheduled inhaler regimen.  De-escalate to oral prednisone today and oral antibiotics as mentioned above.  Still suspect patient will benefit greatly from therapeutic thoracentesis.  New diastolic congestive heart  failure with exacerbation, unclear if improving Transition from IV diuretics to oral torsemide on 2/5.  Continue to monitor daily weights.  Anticipate diuresis with abdominal swelling while using torsemide.  Monitor ins and outs.  Likely OSA/obesity hypoventilation syndrome Will need outpatient evaluation for sleep apnea  Liver cirrhosis, and hepatocellular carcinoma, diagnosed 06/2017 Status post CT guided ablation on 08/30/17.  Presenting symptom of anasarca initially concern for possible ascites however abdominal ultrasound showed trace intra-abdominal ascites, insufficient for safe ultrasound-guided paracentesis.  Patient reports some improvement in abdominal swelling with diuresis.  We will continue to monitor.  Of note ammonia on admission 18, patient not confused.   Code Status: Full  Family Communication: Husband at bedside  Disposition Plan: Home when improvement in respiratory status, awaiting thoracentesis (diagnostic/therapeutic)  Consultants:  None  Procedures:  TTE on 09/09/17: Preserved EF, grade 2 diastolic dysfunction.  Antimicrobials:  Ceftriaxone: 2/2-2/5  Augmentin 2/6-  Cultures:  None  DVT prophylaxis: Heparin   Objective: Vitals:   09/11/17 0823 09/11/17 0921 09/11/17 1403 09/11/17 2031  BP:  (!) 127/49    Pulse:  83    Resp:  18    Temp:  (!) 97.4 F (36.3 C)    TempSrc:  Oral    SpO2: 98% 97% 98% 99%  Weight:      Height:        Intake/Output Summary (Last 24 hours) at 09/11/2017 2053 Last data filed at 09/11/2017 1212 Gross per 24 hour  Intake 360 ml  Output 202 ml  Net 158 ml   Filed Weights   09/08/17 2043 09/09/17 2126 09/10/17 2029  Weight: 84.4 kg (186 lb 1.1 oz) 84.5 kg (186 lb 4.6 oz) 84.5 kg (186 lb 4.7 oz)    Exam:  General: No acute distress, lying in  bed Respiratory: 4 L nasal cannula, no accessory muscle usage, able to talk in complete sentences, no crackles, audible wheezing Gastrointestinal: Soft, nondistended,  nontender Extremities: Chronic venous stasis changes on left lower extremity, no peripheral edema  Data Reviewed: CBC: Recent Labs  Lab 09/07/17 1428 09/07/17 2032 09/08/17 0936  WBC 6.9 5.7 4.4  HGB 9.8* 9.0* 8.1*  HCT 30.4* 28.0* 25.5*  MCV 104.1* 103.7* 105.8*  PLT 124* 110* 264*   Basic Metabolic Panel: Recent Labs  Lab 09/07/17 1428 09/07/17 2032 09/08/17 0936 09/08/17 1107 09/09/17 0712 09/11/17 0857  NA 134*  --  139 137 139 138  K 3.3*  --  4.3 3.9 4.6 4.5  CL 99*  --  103 100* 101 98*  CO2 25  --  26 26 27 27   GLUCOSE 165*  --  91 114* 110* 161*  BUN 19  --  19 20 21* 28*  CREATININE 1.26* 1.27* 1.26* 1.29* 1.35* 1.29*  CALCIUM 8.5*  --  8.6* 8.4* 8.4* 8.9  MG  --  2.0  --  2.0  --   --   PHOS  --  3.7  --  3.8 4.7*  --    GFR: Estimated Creatinine Clearance: 41.4 mL/min (A) (by C-G formula based on SCr of 1.29 mg/dL (H)). Liver Function Tests: Recent Labs  Lab 09/07/17 1428 09/07/17 2032 09/08/17 0936 09/08/17 1107 09/09/17 0712  AST 30  --  33 29  --   ALT 51  --  35 37  --   ALKPHOS 145*  --  129* 131*  --   BILITOT 1.6*  --  1.6* 1.6*  --   PROT 5.3*  --  4.7* 4.8*  --   ALBUMIN 2.7* 2.5* 2.6* 2.7* 2.6*   Recent Labs  Lab 09/07/17 1428  LIPASE 84*   Recent Labs  Lab 09/07/17 2032  AMMONIA 18   Coagulation Profile: Recent Labs  Lab 09/07/17 1428 09/08/17 0936  INR 1.17 1.29   Cardiac Enzymes: No results for input(s): CKTOTAL, CKMB, CKMBINDEX, TROPONINI in the last 168 hours. BNP (last 3 results) No results for input(s): PROBNP in the last 8760 hours. HbA1C: No results for input(s): HGBA1C in the last 72 hours. CBG: Recent Labs  Lab 09/10/17 1640 09/10/17 2026 09/11/17 0748 09/11/17 1210 09/11/17 1700  GLUCAP 116* 198* 125* 161* 163*   Lipid Profile: No results for input(s): CHOL, HDL, LDLCALC, TRIG, CHOLHDL, LDLDIRECT in the last 72 hours. Thyroid Function Tests: No results for input(s): TSH, T4TOTAL, FREET4, T3FREE,  THYROIDAB in the last 72 hours. Anemia Panel: No results for input(s): VITAMINB12, FOLATE, FERRITIN, TIBC, IRON, RETICCTPCT in the last 72 hours. Urine analysis:    Component Value Date/Time   COLORURINE YELLOW 09/07/2017 2036   APPEARANCEUR CLEAR 09/07/2017 2036   LABSPEC 1.015 09/07/2017 2036   PHURINE 6.0 09/07/2017 2036   GLUCOSEU NEGATIVE 09/07/2017 2036   HGBUR SMALL (A) 09/07/2017 2036   BILIRUBINUR NEGATIVE 09/07/2017 2036   KETONESUR NEGATIVE 09/07/2017 2036   PROTEINUR NEGATIVE 09/07/2017 2036   UROBILINOGEN 1 01/13/2015 1145   NITRITE NEGATIVE 09/07/2017 2036   LEUKOCYTESUR NEGATIVE 09/07/2017 2036   Sepsis Labs: @LABRCNTIP (procalcitonin:4,lacticidven:4)  )No results found for this or any previous visit (from the past 240 hour(s)).    Studies: No results found.  Scheduled Meds: . allopurinol  300 mg Oral Daily  . amoxicillin-clavulanate  1 tablet Oral BID  . aspirin EC  81 mg Oral QHS  . bisoprolol  10 mg  Oral Daily  . budesonide (PULMICORT) nebulizer solution  0.25 mg Nebulization BID  . cholecalciferol  1,000 Units Oral Daily  . citalopram  40 mg Oral Q supper  . ferrous sulfate  325 mg Oral Daily  . gabapentin  300 mg Oral BID  . heparin  5,000 Units Subcutaneous Q12H  . insulin aspart  0-9 Units Subcutaneous TID WC  . ipratropium-albuterol  3 mL Nebulization TID  . lactulose  10 g Oral Daily  . pantoprazole  40 mg Oral Daily  . predniSONE  40 mg Oral Q breakfast  . rifampin  300 mg Oral BID WC  . torsemide  30 mg Oral BID    Continuous Infusions:   LOS: 4 days     Desiree Hane, MD [facility] Pager [phone]  If 7PM-7AM, please contact night-coverage [email or web address] Password [facility] 09/11/2017, 8:53 PM

## 2017-09-11 NOTE — Progress Notes (Signed)
Unsuccessful call attempt to spouse H# [phone] rung greater than 8 times with no answer, no option for voicemail. Mobile # [phone] no answer no option for voicemail.   Kristen Cardinal, BSN, RN Case [phone]

## 2017-09-11 NOTE — Care Management Important Message (Signed)
Important Message  Patient Details  Name: [name] MRN: [id number] Date of Birth: 15-Apr-1946   Medicare Important Message Given:  Yes    Orbie Pyo 09/11/2017, 10:36 AM

## 2017-09-11 NOTE — Care Management Note (Addendum)
Case Management Note Late Entry 09/11/2017 4:30pm  Patient Details  Name: [name] MRN: [id number] Date of Birth: 07/04/1946  Additional Comments: In to speak with patient, spouse at bedside; verified spouse available to be with patient 24 hours daily.  Discussed recommendation for [facility] PT, offered spouse and patient choice; they selected [facility].  Patient uses [facility] on [address], in [place], [place].  PCP is Dr. Melford Aase.  Pulmonologist is with [facility]. PTA patient active with [facility] for oxygen.  Home DME: includes nebulizer, 3 in 1, walker and oxygen.  NCM will continue to monitor tor discharge transition.  Kristen Cardinal, RN 09/11/2017, 8:07 PM

## 2017-09-11 NOTE — Progress Notes (Signed)
Physical Therapy Treatment Patient Details Name: MIRTA MALLY MRN: [id number] DOB: March 25, 1946 Today's Date: 09/11/2017    History of Present Illness Pt is a 72 y.o. female admitted for abdominal distention, SOB, and chest pain. PMH includes COPD (on 2 L O2 at home), HTN, HLD, DM, liver CA, hepatic encephalopathy, esophageal varices, and IBS.    PT Comments    Pt making slow progress with functional mobility as she remains limited secondary to fatigue and increased WOB. Pt on 4L of O2 via [place] throughout with SPO2 decreasing to as low as 87% with activity. SPO2 increased slowly to 90% with pt performing pursed-lip breathing. Pt would continue to benefit from skilled physical therapy services at this time while admitted and after d/c to address the below listed limitations in order to improve overall safety and independence with functional mobility.    Follow Up Recommendations  Home health PT;Supervision/Assistance - 24 hour     Equipment Recommendations  None recommended by PT    Recommendations for Other Services       Precautions / Restrictions Precautions Precautions: Fall Restrictions Weight Bearing Restrictions: No    Mobility  Bed Mobility Overal bed mobility: Needs Assistance Bed Mobility: Supine to Sit;Sit to Supine     Supine to sit: Mod assist;HOB elevated Sit to supine: Min assist   General bed mobility comments: increased time and effort, pt's husband assisting with trunk elevation and therapist assisting with bilateral LEs to return to supine  Transfers Overall transfer level: Needs assistance Equipment used: 1 person hand held assist Transfers: Sit to/from [facility] Sit to Stand: Min assist Stand pivot transfers: Min assist       General transfer comment: increased time and effort, husband present and assisting with therapist guarding with gait belt.  Ambulation/Gait             General Gait Details: deferred as pt very  fatigued after using BSC with increased WOB    Stairs            Wheelchair Mobility    Modified Rankin (Stroke Patients Only)       Balance Overall balance assessment: Needs assistance Sitting-balance support: Feet supported Sitting balance-Leahy Scale: Fair     Standing balance support: Bilateral upper extremity supported Standing balance-Leahy Scale: Poor                              Cognition Arousal/Alertness: Awake/alert Behavior During Therapy: Flat affect Overall Cognitive Status: Within Functional Limits for tasks assessed                                 General Comments: difficult to fully assess cognition as pt's husband answering almost all of therapist's questions for pt      Exercises      General Comments        Pertinent Vitals/Pain Pain Assessment: Faces Faces Pain Scale: Hurts a little bit Pain Location: L hip (chronic) Pain Descriptors / Indicators: Sore Pain Intervention(s): Monitored during session;Repositioned    Home Living                      Prior Function            PT Goals (current goals can now be found in the care plan section) Acute Rehab PT Goals PT Goal Formulation: With patient Time  For Goal Achievement: 09/24/17 Potential to Achieve Goals: Fair Progress towards PT goals: Progressing toward goals    Frequency    Min 3X/week      PT Plan Current plan remains appropriate    Co-evaluation              AM-PAC PT "6 Clicks" Daily Activity  Outcome Measure  Difficulty turning over in bed (including adjusting bedclothes, sheets and blankets)?: Unable Difficulty moving from lying on back to sitting on the side of the bed? : Unable Difficulty sitting down on and standing up from a chair with arms (e.g., wheelchair, bedside commode, etc,.)?: Unable Help needed moving to and from a bed to chair (including a wheelchair)?: A Little Help needed walking in hospital room?: A  Little Help needed climbing 3-5 steps with a railing? : Total 6 Click Score: 10    End of Session Equipment Utilized During Treatment: Gait belt;Oxygen;Other (comment)(4L of O2) Activity Tolerance: Patient limited by fatigue Patient left: in bed;with call bell/phone within reach;with family/visitor present Nurse Communication: Mobility status PT Visit Diagnosis: Other abnormalities of gait and mobility (R26.89);Unsteadiness on feet (R26.81);Muscle weakness (generalized) (M62.81)     Time: 8088-1103 PT Time Calculation (min) (ACUTE ONLY): 19 min  Charges:  $Therapeutic Activity: 8-22 mins                    G Codes:       [place], [place], [place] [phone]    Fernando Salinas 09/11/2017, 11:16 AM

## 2017-09-12 ENCOUNTER — Encounter (HOSPITAL_COMMUNITY): Payer: Self-pay | Admitting: General Surgery

## 2017-09-12 ENCOUNTER — Ambulatory Visit: Payer: PPO | Admitting: Physician Assistant

## 2017-09-12 ENCOUNTER — Inpatient Hospital Stay (HOSPITAL_COMMUNITY): Payer: PPO

## 2017-09-12 ENCOUNTER — Ambulatory Visit: Payer: Self-pay

## 2017-09-12 DIAGNOSIS — Z9889 Other specified postprocedural states: Secondary | ICD-10-CM

## 2017-09-12 HISTORY — PX: IR THORACENTESIS ASP PLEURAL SPACE W/IMG GUIDE: IMG5380

## 2017-09-12 LAB — BODY FLUID CELL COUNT WITH DIFFERENTIAL
Lymphs, Fluid: 54 %
Monocyte-Macrophage-Serous Fluid: 16 % — ABNORMAL LOW (ref 50–90)
Neutrophil Count, Fluid: 30 % — ABNORMAL HIGH (ref 0–25)
WBC FLUID: 48 uL (ref 0–1000)

## 2017-09-12 LAB — LACTATE DEHYDROGENASE: LDH: 232 U/L — ABNORMAL HIGH (ref 98–192)

## 2017-09-12 LAB — ALBUMIN, PLEURAL OR PERITONEAL FLUID: Albumin, Fluid: <1 g/dL

## 2017-09-12 LAB — GLUCOSE, PLEURAL OR PERITONEAL FLUID: Glucose, Fluid: 142 mg/dL

## 2017-09-12 LAB — LACTATE DEHYDROGENASE, PLEURAL OR PERITONEAL FLUID: LD FL: 50 U/L — AB (ref 3–23)

## 2017-09-12 LAB — GLUCOSE, CAPILLARY
Glucose-Capillary: 106 mg/dL — ABNORMAL HIGH (ref 65–99)
Glucose-Capillary: 99 mg/dL (ref 65–99)
Glucose-Capillary: 177 mg/dL — ABNORMAL HIGH (ref 65–99)
Glucose-Capillary: 203 mg/dL — ABNORMAL HIGH (ref 65–99)

## 2017-09-12 LAB — PROTEIN, PLEURAL OR PERITONEAL FLUID

## 2017-09-12 MED ORDER — OXYCODONE HCL 5 MG PO TABS
5.0000 mg | ORAL_TABLET | Freq: Once | ORAL | Status: AC
  Administered 2017-09-12: 5 mg via ORAL
  Filled 2017-09-12: qty 1

## 2017-09-12 MED ORDER — LIDOCAINE 2% (20 MG/ML) 5 ML SYRINGE
INTRAMUSCULAR | Status: AC
  Filled 2017-09-12: qty 10

## 2017-09-12 MED ORDER — TORSEMIDE 20 MG PO TABS
60.0000 mg | ORAL_TABLET | Freq: Every evening | ORAL | Status: DC
  Administered 2017-09-12 – 2017-09-13 (×2): 60 mg via ORAL
  Filled 2017-09-12 (×2): qty 3

## 2017-09-12 MED ORDER — TORSEMIDE 20 MG PO TABS
30.0000 mg | ORAL_TABLET | Freq: Every day | ORAL | Status: DC
  Administered 2017-09-13: 30 mg via ORAL
  Filled 2017-09-12: qty 2

## 2017-09-12 MED ORDER — LIDOCAINE HCL (PF) 1 % IJ SOLN
INTRAMUSCULAR | Status: DC | PRN
  Administered 2017-09-12: 10 mL

## 2017-09-12 NOTE — Progress Notes (Signed)
Physical Therapy Treatment Patient Details Name: [name] MRN: [id number] DOB: 1946/07/03 Today's Date: 09/12/2017    History of Present Illness Pt is a 72 y.o. female admitted for abdominal distention, SOB, and chest pain. PMH includes COPD (on 2 L O2 at home), HTN, HLD, DM, liver CA, hepatic encephalopathy, esophageal varices, and IBS.    PT Comments    Pt making slow progress with mobility and remains limited secondary to increased WOB and poor pulmonary tolerance. Pt on 4L of O2 throughout with SPO2 decreasing to as low as 76% with activity with slow recovery to >90% with seated rest break and pursed-lip breathing. Pt would continue to benefit from skilled physical therapy services at this time while admitted and after d/c to address the below listed limitations in order to improve overall safety and independence with functional mobility.    Follow Up Recommendations  Home health PT;Supervision/Assistance - 24 hour     Equipment Recommendations  None recommended by PT    Recommendations for Other Services       Precautions / Restrictions Precautions Precautions: Fall Restrictions Weight Bearing Restrictions: No    Mobility  Bed Mobility Overal bed mobility: Needs Assistance Bed Mobility: Supine to Sit;Sit to Supine     Supine to sit: Mod assist;HOB elevated Sit to supine: Min assist   General bed mobility comments: increased time and effort, assist needed with trunk elevation and bilateral LEs to return to supine  Transfers Overall transfer level: Needs assistance Equipment used: None Transfers: Sit to/from [facility] Sit to Stand: Min guard Stand pivot transfers: Min guard       General transfer comment: increased time and effort, pt very cautious with min guard for safety   Ambulation/Gait             General Gait Details: deferred as pt very fatigued after using BSC with increased WOB and SPO2 decreasing to as low as 76% with  slow recovery to >90% with pursed-lip breathing sitting EOB   Stairs            Wheelchair Mobility    Modified Rankin (Stroke Patients Only)       Balance Overall balance assessment: Needs assistance Sitting-balance support: Feet supported Sitting balance-Leahy Scale: Fair     Standing balance support: Bilateral upper extremity supported Standing balance-Leahy Scale: Poor                              Cognition Arousal/Alertness: Awake/alert Behavior During Therapy: WFL for tasks assessed/performed Overall Cognitive Status: Within Functional Limits for tasks assessed                                        Exercises      General Comments        Pertinent Vitals/Pain Pain Assessment: Faces Faces Pain Scale: No hurt Pain Intervention(s): Monitored during session    Home Living                      Prior Function            PT Goals (current goals can now be found in the care plan section) Acute Rehab PT Goals PT Goal Formulation: With patient Time For Goal Achievement: 09/24/17 Potential to Achieve Goals: Fair Progress towards PT goals: Progressing toward goals  Frequency    Min 3X/week      PT Plan Current plan remains appropriate    Co-evaluation              AM-PAC PT "6 Clicks" Daily Activity  Outcome Measure  Difficulty turning over in bed (including adjusting bedclothes, sheets and blankets)?: Unable Difficulty moving from lying on back to sitting on the side of the bed? : Unable Difficulty sitting down on and standing up from a chair with arms (e.g., wheelchair, bedside commode, etc,.)?: Unable Help needed moving to and from a bed to chair (including a wheelchair)?: A Little Help needed walking in hospital room?: A Little Help needed climbing 3-5 steps with a railing? : Total 6 Click Score: 10    End of Session Equipment Utilized During Treatment: Gait belt;Back brace(4L of  O2) Activity Tolerance: Patient limited by fatigue;Other (comment)(SPO2 decreasing to as low as 76% on 4L with transfers) Patient left: in bed;with call bell/phone within reach;with bed alarm set Nurse Communication: Mobility status PT Visit Diagnosis: Other abnormalities of gait and mobility (R26.89);Unsteadiness on feet (R26.81);Muscle weakness (generalized) (M62.81)     Time: 9794-8016 PT Time Calculation (min) (ACUTE ONLY): 20 min  Charges:  $Therapeutic Activity: 8-22 mins                    G Codes:       [place], [place], [place] 09/12/2017, 9:57 AM

## 2017-09-12 NOTE — Progress Notes (Signed)
PROGRESS NOTE  [name] VQM:[id number] DOB: January 28, 1946 DOA: 09/07/2017 PCP: Unk Pinto, MD  HPI/Recap of past 24 hours:  [name] is a 72 y.o. year old female with medical history significant for liver cirrhosis, hepatocellular carcinoma, COPD, on chronic lactulose for hepatic encephalopathy who presented on 09/07/2017 with worsening abdominal swelling and shortness of breath and was found to have acute on chronic hypoxic respiratory failure multifactorial etiology.  Subjective  1.5 L removed with thoracentesis today. Feeling much better breathing now. No overnight complaints  Assessment/Plan: Active Problems:   Hepatic cirrhosis (HCC)   Essential hypertension   Hyperlipidemia   Venous insufficiency   Acute on chronic respiratory failure with hypoxemia (HCC)   COPD mixed type (HCC)   Anasarca   Hepatocellular carcinoma (HCC)   Ascites   Acute diastolic CHF (congestive heart failure) (HCC)   Pleural effusion on right    Acute on chronic hypoxic/hypercarbic respiratory failure, multifactorial etiology (COPD exacerbation, CHF exacerbation, OSA/OHS, pNA, right pleural effusion) improving Stable at 4 L oxygen. Much more comfortable on respiratory exam after thoracentesis today. Will increase her evening torsemide to 60 mg and monitor weights to help maintain as close to euvolemic as possible. If wgts remain stable or even decrease think she will be able to discharge home on 2/8. Continue scheduled inhalers, augmentin, and prednisone  COPD exacerbation, improving Continue scheduled inhaler regimen (budesonide, duo-nebs). Oral prednisone.   Wean oxygen as tolerates  New diastolic congestive heart failure with exacerbation, unclear if improving Transitioned from IV diuretics to oral torsemide on 2/5.  Weights have not gone up but have also not decreased as I would like to show some improvement in volume status. Will increase evening torsemide to 60 mg so she will now be  on total of 90 mg of torsemide which is 1.5 times greater than her home dose of lasix. Expect improvement in fluid out.  Continue to monitor daily weights and BMP  R transudative Pleural effusion Fluid analysis consistent with transudative effusion. Likely secondary to CHF and hepatic hydrothorax. Repeat CXR shows decrease in effusion after thoracentesis ( 1.5 L removed on 2/7)  Likely OSA/obesity hypoventilation syndrome Will need outpatient evaluation for sleep apnea  Liver cirrhosis, and hepatocellular carcinoma, diagnosed 06/2017 Status post CT guided ablation on 08/30/17.  Presenting symptom of anasarca initially concern for possible ascites however abdominal ultrasound showed trace intra-abdominal ascites, insufficient for safe ultrasound-guided paracentesis.  Patient reports some improvement in abdominal swelling with diuresis.  We will continue to monitor.  Of note ammonia on admission 18, patient not confused.   Code Status: Full  Family Communication: Husband at bedside  Disposition Plan: Home once torsemide dose finalized and respiratory status stable  Consultants:  None  Procedures:  TTE on 09/09/17: Preserved EF, grade 2 diastolic dysfunction.  Antimicrobials:  Ceftriaxone: 2/2-2/5  Augmentin 2/6-  Cultures:  None  DVT prophylaxis: Heparin   Objective: Vitals:   09/12/17 0958 09/12/17 1025 09/12/17 1043 09/12/17 1412  BP: (!) 124/53 139/68 (!) 153/57   Pulse: 86     Resp: 20     Temp: 98.1 F (36.7 C)     TempSrc: Oral     SpO2: 100%   95%  Weight:      Height:        Intake/Output Summary (Last 24 hours) at 09/12/2017 1524 Last data filed at 09/12/2017 0600 Gross per 24 hour  Intake 240 ml  Output 0 ml  Net 240 ml   Filed  Weights   09/10/17 2029 09/11/17 2135 09/12/17 0137  Weight: 84.5 kg (186 lb 4.7 oz) 84.6 kg (186 lb 8.2 oz) 84.6 kg (186 lb 8.2 oz)    Exam:  General: No acute distress, lying in bed Cardiovascular: RRR, with no m,r,or  g Respiratory: 4 L nasal cannula, no accessory muscle usage, able to talk in complete sentences, no crackles, audible wheezing Extremities: Chronic venous stasis changes on left lower extremity, no peripheral edema  Data Reviewed: CBC: Recent Labs  Lab 09/07/17 1428 09/07/17 2032 09/08/17 0936  WBC 6.9 5.7 4.4  HGB 9.8* 9.0* 8.1*  HCT 30.4* 28.0* 25.5*  MCV 104.1* 103.7* 105.8*  PLT 124* 110* 517*   Basic Metabolic Panel: Recent Labs  Lab 09/07/17 1428 09/07/17 2032 09/08/17 0936 09/08/17 1107 09/09/17 0712 09/11/17 0857  NA 134*  --  139 137 139 138  K 3.3*  --  4.3 3.9 4.6 4.5  CL 99*  --  103 100* 101 98*  CO2 25  --  26 26 27 27   GLUCOSE 165*  --  91 114* 110* 161*  BUN 19  --  19 20 21* 28*  CREATININE 1.26* 1.27* 1.26* 1.29* 1.35* 1.29*  CALCIUM 8.5*  --  8.6* 8.4* 8.4* 8.9  MG  --  2.0  --  2.0  --   --   PHOS  --  3.7  --  3.8 4.7*  --    GFR: Estimated Creatinine Clearance: 41.4 mL/min (A) (by C-G formula based on SCr of 1.29 mg/dL (H)). Liver Function Tests: Recent Labs  Lab 09/07/17 1428 09/07/17 2032 09/08/17 0936 09/08/17 1107 09/09/17 0712  AST 30  --  33 29  --   ALT 51  --  35 37  --   ALKPHOS 145*  --  129* 131*  --   BILITOT 1.6*  --  1.6* 1.6*  --   PROT 5.3*  --  4.7* 4.8*  --   ALBUMIN 2.7* 2.5* 2.6* 2.7* 2.6*   Recent Labs  Lab 09/07/17 1428  LIPASE 84*   Recent Labs  Lab 09/07/17 2032  AMMONIA 18   Coagulation Profile: Recent Labs  Lab 09/07/17 1428 09/08/17 0936  INR 1.17 1.29   Cardiac Enzymes: No results for input(s): CKTOTAL, CKMB, CKMBINDEX, TROPONINI in the last 168 hours. BNP (last 3 results) No results for input(s): PROBNP in the last 8760 hours. HbA1C: No results for input(s): HGBA1C in the last 72 hours. CBG: Recent Labs  Lab 09/11/17 1210 09/11/17 1700 09/11/17 2134 09/12/17 0741 09/12/17 1221  GLUCAP 161* 163* 127* 106* 99   Lipid Profile: No results for input(s): CHOL, HDL, LDLCALC, TRIG,  CHOLHDL, LDLDIRECT in the last 72 hours. Thyroid Function Tests: No results for input(s): TSH, T4TOTAL, FREET4, T3FREE, THYROIDAB in the last 72 hours. Anemia Panel: No results for input(s): VITAMINB12, FOLATE, FERRITIN, TIBC, IRON, RETICCTPCT in the last 72 hours. Urine analysis:    Component Value Date/Time   COLORURINE YELLOW 09/07/2017 2036   APPEARANCEUR CLEAR 09/07/2017 2036   LABSPEC 1.015 09/07/2017 2036   PHURINE 6.0 09/07/2017 2036   GLUCOSEU NEGATIVE 09/07/2017 2036   HGBUR SMALL (A) 09/07/2017 2036   BILIRUBINUR NEGATIVE 09/07/2017 2036   KETONESUR NEGATIVE 09/07/2017 2036   PROTEINUR NEGATIVE 09/07/2017 2036   UROBILINOGEN 1 01/13/2015 1145   NITRITE NEGATIVE 09/07/2017 2036   LEUKOCYTESUR NEGATIVE 09/07/2017 2036   Sepsis Labs: @LABRCNTIP (procalcitonin:4,lacticidven:4)  )No results found for this or any previous visit (from the past  240 hour(s)).    Studies: Dg Chest 1 View  Result Date: 09/12/2017 CLINICAL DATA:  Status post thoracentesis EXAM: CHEST 1 VIEW COMPARISON:  09/10/2017 radiographs FINDINGS: Reduced size of right pleural effusion. No visible pneumothorax. Re-expansion of significant portions of the right lung. There is some persistent peripheral opacity at the right lung base likely representing residual effusion. The left lung remains clear. Cervical plate and screw fixators. Right proximal humeral prosthesis. Aortoiliac atherosclerotic vascular disease. IMPRESSION: 1. Significant reduction in size of the right pleural effusion and associated re-expansion of portions of the right lung. Small persistent effusion peripherally at the right lung base. No pneumothorax or complicating feature. 2.  Aortic Atherosclerosis (ICD10-I70.0). Electronically Signed   By: Van Clines M.D.   On: 09/12/2017 12:05   Ir Thoracentesis Asp Pleural Space W/img Guide  Result Date: 09/12/2017 INDICATION: Patient with dyspnea and history of hepatic malignancy. Right-sided  pleural effusion noted on recent imaging. Request is made for diagnostic and therapeutic thoracentesis. EXAM: ULTRASOUND GUIDED DIAGNOSTIC AND THERAPEUTIC THORACENTESIS MEDICATIONS: 2% lidocaine COMPLICATIONS: None immediate. PROCEDURE: An ultrasound guided thoracentesis was thoroughly discussed with the patient and questions answered. The benefits, risks, alternatives and complications were also discussed. The patient understands and wishes to proceed with the procedure. Written consent was obtained. Ultrasound was performed to localize and mark an adequate pocket of fluid in the right chest. The area was then prepped and draped in the normal sterile fashion. 2% Lidocaine was used for local anesthesia. Under ultrasound guidance a Safe-T-Centesis catheter was introduced. Thoracentesis was performed. The catheter was removed and a dressing applied. FINDINGS: A total of approximately 1.5 L of slightly cloudy serous fluid was removed. Samples were sent to the laboratory as requested by the clinical team. IMPRESSION: Successful ultrasound guided right thoracentesis yielding 1.5 L of pleural fluid. Procedure was terminated slightly early secondary to chest pain and coughing. Read by: Saverio Danker, PA-C Electronically Signed   By: Markus Daft M.D.   On: 09/12/2017 13:32    Scheduled Meds: . allopurinol  300 mg Oral Daily  . amoxicillin-clavulanate  1 tablet Oral BID  . aspirin EC  81 mg Oral QHS  . bisoprolol  10 mg Oral Daily  . budesonide (PULMICORT) nebulizer solution  0.25 mg Nebulization BID  . cholecalciferol  1,000 Units Oral Daily  . citalopram  40 mg Oral Q supper  . ferrous sulfate  325 mg Oral Daily  . gabapentin  300 mg Oral BID  . heparin  5,000 Units Subcutaneous Q12H  . insulin aspart  0-9 Units Subcutaneous TID WC  . ipratropium-albuterol  3 mL Nebulization TID  . lactulose  10 g Oral Daily  . lidocaine      . pantoprazole  40 mg Oral Daily  . predniSONE  40 mg Oral Q breakfast  .  rifampin  300 mg Oral BID WC  . [START ON 09/13/2017] torsemide  30 mg Oral Daily  . torsemide  60 mg Oral QPM    Continuous Infusions:   LOS: 5 days     Desiree Hane, MD [facility] Pager [phone]  If 7PM-7AM, please contact night-coverage [email or web address] Password [email or web address] 09/12/2017, 3:24 PM

## 2017-09-12 NOTE — Progress Notes (Signed)
Patient complaining of severe cramping pain to bilateral lower extremities, rating as 9/10, at approximately 0110.  [facility] called and made aware.  Received order for Oxy IR 5 mg x 1 which was given at 0142.  Upon reassessment, patient still rated pain as 9/10.  Flexeril given at 0311.  Upon reassessment at 0400, patient now rating pain as 5/10.  Will continue to monitor patient.  [employer] RN-BC, WTA.

## 2017-09-12 NOTE — Procedures (Signed)
Ultrasound-guided diagnostic and therapeutic right thoracentesis performed yielding 1.5 liters of slightly cloudy yellow colored fluid. No immediate complications. Follow-up chest x-ray pending.      Henreitta Cea 11:48 AM 09/12/2017

## 2017-09-12 NOTE — Consult Note (Signed)
   [facility] Inpatient Consult   09/12/2017  [name] 11/07/1945 [id number]  Patient was assessed for [facility] for community services. 72 year old patient admitted with shortness of breath abdominal distention with chest pain/discomfort.  Patient has a HX of COPD with home 02 and DME.  Patient was previously active with [facility].  Met with patient and husband at bedside regarding being restarted with [facility] services.  They currently denies any needs at home.  Husband is available 24 hours a day. Husband did accept a brochure with 24 hour nurse advise line with contact information.    Currently patient is for home with home health.   Of note, [facility] services does not replace or interfere with any services that are arranged by inpatient case management or social work. For additional questions or referrals please contact:  Natividad Brood, RN BSN [facility] Liaison  [phone] business mobile phone Toll free office [phone]

## 2017-09-13 ENCOUNTER — Inpatient Hospital Stay (HOSPITAL_COMMUNITY): Payer: PPO

## 2017-09-13 DIAGNOSIS — J181 Lobar pneumonia, unspecified organism: Secondary | ICD-10-CM

## 2017-09-13 DIAGNOSIS — R1084 Generalized abdominal pain: Secondary | ICD-10-CM

## 2017-09-13 DIAGNOSIS — R0602 Shortness of breath: Secondary | ICD-10-CM

## 2017-09-13 DIAGNOSIS — I872 Venous insufficiency (chronic) (peripheral): Secondary | ICD-10-CM

## 2017-09-13 LAB — CBC
HCT: 28.8 % — ABNORMAL LOW (ref 36.0–46.0)
Hemoglobin: 8.7 g/dL — ABNORMAL LOW (ref 12.0–15.0)
MCH: 31.9 pg (ref 26.0–34.0)
MCHC: 30.2 g/dL (ref 30.0–36.0)
MCV: 105.5 fL — AB (ref 78.0–100.0)
PLATELETS: 123 10*3/uL — AB (ref 150–400)
RBC: 2.73 MIL/uL — AB (ref 3.87–5.11)
RDW: 15.8 % — AB (ref 11.5–15.5)
WBC: 6.1 10*3/uL (ref 4.0–10.5)

## 2017-09-13 LAB — GLUCOSE, CAPILLARY
Glucose-Capillary: 94 mg/dL (ref 65–99)
Glucose-Capillary: 164 mg/dL — ABNORMAL HIGH (ref 65–99)
Glucose-Capillary: 161 mg/dL — ABNORMAL HIGH (ref 65–99)

## 2017-09-13 LAB — GRAM STAIN

## 2017-09-13 MED ORDER — BUDESONIDE 0.25 MG/2ML IN SUSP: 0.2500 mg | Freq: Two times a day (BID) | RESPIRATORY_TRACT | 12 refills | Status: DC

## 2017-09-13 MED ORDER — DM-GUAIFENESIN ER 30-600 MG PO TB12: 1.0000 | ORAL_TABLET | Freq: Two times a day (BID) | ORAL | 0 refills | Status: DC | PRN

## 2017-09-13 MED ORDER — TORSEMIDE 20 MG PO TABS: 60.0000 mg | ORAL_TABLET | Freq: Every evening | ORAL | 0 refills | Status: DC

## 2017-09-13 MED ORDER — TORSEMIDE 10 MG PO TABS: 30.0000 mg | ORAL_TABLET | ORAL | 0 refills | Status: DC

## 2017-09-13 MED ORDER — PREDNISONE 20 MG PO TABS: 40.0000 mg | ORAL_TABLET | Freq: Every day | ORAL | 0 refills | Status: AC

## 2017-09-13 MED ORDER — AMOXICILLIN-POT CLAVULANATE 875-125 MG PO TABS: 1.0000 | ORAL_TABLET | Freq: Two times a day (BID) | ORAL | 0 refills | Status: DC

## 2017-09-13 NOTE — Progress Notes (Signed)
[name] to be D/C'd Home per MD order.  Discussed prescriptions and follow up appointments with the patient. Prescriptions given to patient, medication list explained in detail. Pt verbalized understanding.  Allergies as of 09/13/2017      Reactions   Atorvastatin Other (See Comments)   Unknown reaction   Diphenhydramine Hcl (sleep) Hives   Hydrocodone-acetaminophen Other (See Comments)   Unknown reaction   Lopid [gemfibrozil] Other (See Comments)   Unknown reaction   Loratadine Hives   Lorazepam Hives   Simvastatin Other (See Comments)   Unknown reaction   Sulfamethoxazole Hives   Sulfonamide Derivatives Hives      Medication List    STOP taking these medications   furosemide 80 MG tablet Commonly known as:  LASIX     TAKE these medications   albuterol 108 (90 Base) MCG/ACT inhaler Commonly known as:  PROVENTIL HFA;VENTOLIN HFA Inhale 2 puffs into the lungs every 6 (six) hours as needed for wheezing or shortness of breath.   allopurinol 300 MG tablet Commonly known as:  ZYLOPRIM take 1/2-1 tablet by mouth daily for gout What changed:  See the new instructions.   amoxicillin-clavulanate 875-125 MG tablet Commonly known as:  AUGMENTIN Take 1 tablet by mouth 2 (two) times daily for 8 doses.   aspirin EC 81 MG tablet Take 81 mg by mouth at bedtime.   bisoprolol 10 MG tablet Commonly known as:  ZEBETA Take 1 tablet (10 mg total) by mouth daily. What changed:  when to take this   budesonide 0.25 MG/2ML nebulizer solution Commonly known as:  PULMICORT Take 2 mLs (0.25 mg total) by nebulization 2 (two) times daily.   citalopram 40 MG tablet Commonly known as:  CELEXA Take 1 tablet (40 mg total) by mouth 2 (two) times daily. What changed:  when to take this   cyclobenzaprine 10 MG tablet Commonly known as:  FLEXERIL Take 1 tablet (10 mg total) by mouth 3 (three) times daily as needed for muscle spasms.   dextromethorphan-guaiFENesin 30-600 MG 12hr  tablet Commonly known as:  MUCINEX DM Take 1 tablet by mouth 2 (two) times daily as needed for cough.   ferrous sulfate 325 (65 FE) MG tablet Take 325 mg by mouth daily.   gabapentin 600 MG tablet Commonly known as:  NEURONTIN Take 1/2 to 1 tablet 3 times a day for neuropathy pain in legs What changed:    how much to take  how to take this  when to take this  additional instructions   glucose blood test strip Take sugars once daily   ipratropium 0.02 % nebulizer solution Commonly known as:  ATROVENT use 1 vial in nebulizer EVERY 4 HOURS AS NEEDED FOR WHEEZING OR SHORTNESS OF BREATH 75/15=5   lactulose 10 GM/15ML solution Commonly known as:  CHRONULAC take 7ms TWICE DAILY What changed:    how much to take  how to take this  when to take this  additional instructions   Magnesium 250 MG Tabs Take 250 mg by mouth at bedtime.   metolazone 5 MG tablet Commonly known as:  ZAROXOLYN Take 1 tablet (5 mg total) by mouth once a week. Take once a week with lasix or as directed for fluid overload What changed:    how much to take  when to take this  additional instructions   NONFORMULARY OR COMPOUNDED ITEM Apply 1 application topically See admin instructions. SALICYLIC ACID ACID (PETROLATUM) 50% OINTMENT-APPLY TO AFFECTED AREA AT BEDTIME PRN DRY SKIN/LEGS (  Compounded at [facility])   nystatin cream Commonly known as:  MYCOSTATIN Apply 1 application topically 2 (two) times daily. What changed:    when to take this  reasons to take this   ondansetron 4 MG tablet Commonly known as:  ZOFRAN Take 1 tablet (4 mg total) by mouth every 6 (six) hours as needed for nausea or vomiting.   OXYGEN Inhale 2-3 L into the lungs continuous. 3 L with exertion   pantoprazole 40 MG tablet Commonly known as:  PROTONIX Take 1 tablet (40 mg total) by mouth daily. What changed:  when to take this   potassium chloride 10 MEQ tablet Commonly known as:   K-DUR,KLOR-CON Take 3 tablets (30 mEq total) by mouth 3 (three) times daily. What changed:    how much to take  when to take this  additional instructions   predniSONE 20 MG tablet Commonly known as:  DELTASONE Take 2 tablets (40 mg total) by mouth daily with breakfast for 1 day. Start taking on:  09/14/2017   rifampin 300 MG capsule Commonly known as:  RIFADIN Take 1 capsule 2 x / day for Liver What changed:    how much to take  how to take this  when to take this  additional instructions   terconazole 0.4 % vaginal cream Commonly known as:  TERAZOL 7 Place 1 applicator vaginally at bedtime. What changed:    when to take this  reasons to take this   torsemide 10 MG tablet Commonly known as:  DEMADEX Take 3 tablets (30 mg total) by mouth every morning.   torsemide 20 MG tablet Commonly known as:  DEMADEX Take 3 tablets (60 mg total) by mouth every evening. Start taking on:  09/14/2017   traMADol 50 MG tablet Commonly known as:  ULTRAM Take 50 mg by mouth every 8 hours as needed for pain What changed:    how much to take  how to take this  when to take this  reasons to take this  additional instructions   triamcinolone cream 0.1 % Commonly known as:  KENALOG Apply 1 application topically 3 (three) times daily as needed for pain What changed:    how much to take  how to take this  when to take this  reasons to take this  additional instructions   Vitamin D3 5000 units Caps Take 5,000 Units by mouth daily with breakfast.       Vitals:   09/13/17 1000 09/13/17 1626  BP: (!) 137/50   Pulse: 93   Resp: 20   Temp: 98.3 F (36.8 C)   SpO2: 93% 94%    Skin clean, dry and intact without evidence of skin break down, no evidence of skin tears noted. IV catheter discontinued intact. Site without signs and symptoms of complications. Dressing and pressure applied. Pt denies pain at this time. No complaints noted.  An After Visit Summary was  printed and given to the patient. Patient escorted via [place], and D/C home via private auto.  Dixie Dials RN, BSN

## 2017-09-13 NOTE — Discharge Summary (Signed)
Discharge Summary  [name] BJS:[id number] DOB: 06-13-1946  PCP: Unk Pinto, MD  Admit date: 09/07/2017 Discharge date: 09/16/2017  Time spent: Less than 25 minutes  Admitted From: Home Disposition: Home  Recommendations for Outpatient Follow-up: Stable 1. Follow up with PCP in 1-2 weeks. Started Torsemide 30 mg am, 60 mg pm    Home Health:PT Equipment/Devices:4 L oxygen  Discharge Diagnoses:  Active Hospital Problems   Diagnosis Date Noted  . Acute diastolic CHF (congestive heart failure) ([place]) 09/11/2017  . Pleural effusion on right 09/11/2017  . Ascites 09/07/2017  . Hepatocellular carcinoma ([place]) 08/30/2017  . Anasarca 12/14/2016  . COPD mixed type ([place]) 11/30/2016  . PNA (pneumonia) 01/24/2016  . Acute on chronic respiratory failure with hypoxemia ([place]) 08/07/2015  . Venous insufficiency 07/26/2015  . Essential hypertension   . Hyperlipidemia   . Hepatic cirrhosis ([place]) 05/28/2008    Resolved Hospital Problems  No resolved problems to display.    Discharge Condition: Stable  CODE STATUS: DNR  Diet recommendation: Heart Healthy  Vitals:   09/13/17 1000 09/13/17 1626  BP: (!) 137/50   Pulse: 93   Resp: 20   Temp: 98.3 F (36.8 C)   SpO2: 93% 94%    History of present illness:  [name] is a 72 y.o. year old female with medical history significant for liver cirrhosis, hepatocellular carcinoma, COPD, on chronic lactulose for hepatic encephalopathy who presented on 09/07/2017 with worsening abdominal swelling and shortness of breath and was found to have acute on chronic hypoxic respiratory failure of multifactorial etiology.  Hospital Course:  Active Problems:   Hepatic cirrhosis (HCC)   Essential hypertension   Hyperlipidemia   Venous insufficiency   Acute on chronic respiratory failure with hypoxemia (HCC)   PNA (pneumonia)   COPD mixed type (HCC)   Anasarca   Hepatocellular carcinoma (HCC)   Ascites   Acute diastolic CHF  (congestive heart failure) (HCC)   Pleural effusion on right   Abdominal swelling,  Liver cirrhosis, ascites and hepatocellular carcinoma, diagnosed 06/2017 Status post CT guided ablation on 08/30/17 prior to admissionPresenting symptom of anasarca initially concern for possible ascites however abdominal ultrasound x2 showed trace intra-abdominal ascites (2/3 and 2/8), insufficient for safe ultrasound-guided paracentesis.  With no signs of acute abdomen.   No concern for ileus/obstruction given patient continued to have normal bowel movements. Some improvement with IV Lasix followed by oral torsemide regimen. Ammonia 18 on admission and patient with normal mental status throughout admission  Acute on chronic hypoxic/hypercarbic respiratory failure, multifactorial etiology (COPD exacerbation, CHF exacerbation, OSA/OHS, pNA, right pleural effusion)  Stable at 4 L oxygen on discharge. S/p thoracentesis on 2/7. Increased her torsemide regimen as well but still seems to be labored in her breathing. I believe its do to likely worsening ascites. Will monitor for improvement if able to get paracentesis (depending on if enough ascites present) Continue scheduled inhalers, augmentin, and prednisone  Right lower lobe pneumonia Empirically treated with 4 days of IV ceftriaxone, then transition to Augmentin for the remaining 3 days.  Completed 7 total days of antibiotic therapy while in the hospital.  COPD exacerbation,improved Treated with IV Solu-Medrol, budesonide and scheduled duo nebs. Was able to transition to oral prednisone during admission Discharged home with 1 day of prednisone to complete total 5-day taper Continue scheduled inhaler regimen (budesonide, duo-nebs). Oral prednisone.   Wean oxygen as tolerates  New diastolic congestive heart failure with exacerbation previously on Transitioned from IV diuretics to oral torsemide  on 2/5.   Lasix at home but switched to torsemide for increased  absorption Discharged on increased torsemide 30 mg a.m, 60 mg p.m, Wgt 186 on discharge Will resume home metolazone  R transudative Pleural effusion Breathing improved significantly after thoracentesis on 2/7 (1.5L removed)  Fluid analysis consistent with transudative effusion. Likely secondary to CHF and hepatic hydrothorax.  Repeat CXR s/p thora showed decrease in effusion after thoracentesis   Likely OSA/obesity hypoventilation syndrome Will need outpatient evaluation for sleep apnea   Antimicrobials:  Ceftriaxone: 2/2-2/5  Augmentin 2/6-2/8   Consultations:  None   Procedures/Studies:  TTE on 09/09/17: Preserved EF, grade 2 diastolic dysfunction.  Thoracentesis on 09/12/17: 1.5 L clear fluid removed (transudate effusion based on light's criteria)  Dg Chest 1 View  Result Date: 09/12/2017 CLINICAL DATA:  Status post thoracentesis EXAM: CHEST 1 VIEW COMPARISON:  09/10/2017 radiographs FINDINGS: Reduced size of right pleural effusion. No visible pneumothorax. Re-expansion of significant portions of the right lung. There is some persistent peripheral opacity at the right lung base likely representing residual effusion. The left lung remains clear. Cervical plate and screw fixators. Right proximal humeral prosthesis. Aortoiliac atherosclerotic vascular disease. IMPRESSION: 1. Significant reduction in size of the right pleural effusion and associated re-expansion of portions of the right lung. Small persistent effusion peripherally at the right lung base. No pneumothorax or complicating feature. 2.  Aortic Atherosclerosis (ICD10-I70.0). Electronically Signed   By: Van Clines M.D.   On: 09/12/2017 12:05   Dg Chest 1 View  Result Date: 08/30/2017 CLINICAL DATA:  Preop ablation EXAM: CHEST 1 VIEW COMPARISON:  12/07/2016 FINDINGS: Heart is upper limits normal in size. Minimal bibasilar atelectasis or scarring. No effusions. No acute bony abnormality. IMPRESSION: Minimal  bibasilar atelectasis or scarring. Electronically Signed   By: Rolm Baptise M.D.   On: 08/30/2017 08:44   Dg Chest 2 View  Result Date: 09/07/2017 CLINICAL DATA:  Chest pain EXAM: CHEST  2 VIEW COMPARISON:  08/30/2017 FINDINGS: Small to moderate layering right pleural effusion. Left lung is clear. No frank interstitial edema. No pneumothorax. The heart is normal in size. Mild degenerative changes of the visualized thoracolumbar spine. L1 vertebral augmentation. Cervical spine fixation hardware. IMPRESSION: Small to moderate layering right pleural effusion, new. Electronically Signed   By: Julian Hy M.D.   On: 09/07/2017 15:49   Ct Abdomen Pelvis W Contrast  Result Date: 09/07/2017 CLINICAL DATA:  Abdominal pain, swelling and shortness of breath. Status post liver cancer surgery. EXAM: CT ABDOMEN AND PELVIS WITH CONTRAST TECHNIQUE: Multidetector CT imaging of the abdomen and pelvis was performed using the standard protocol following bolus administration of intravenous contrast. CONTRAST:  43m ISOVUE-300 IOPAMIDOL (ISOVUE-300) INJECTION 61% COMPARISON:  Liver ablation CT dated 08/30/2017. Chest CT dated 07/02/2017. Abdomen and pelvis CT dated 07/11/2016. Abdomen and pelvis CT dated 07/01/2012. FINDINGS: Lower chest: Interval moderate-sized right pleural effusion with right lower lobe atelectasis. Hepatobiliary: Stable nodular liver contours with a small right lobe and enlarged lateral segment left lobe and caudate lobe. Interval post ablation changes in the right lobe and lateral segment left lobe. No visible mass separate from these changes at either location. Cholecystectomy clips. Pancreas: Unremarkable. No pancreatic ductal dilatation or surrounding inflammatory changes. Spleen: Diffusely enlarged, without significant change. Adrenals/Urinary Tract: Stable dilatation of the right renal collecting system without ureteral dilatation. A cortical scar in the mid to upper right kidney containing a 6 mm  calcification is unchanged. There is also an additional 5 mm right renal calculus  more inferiorly without significant change. A 2.0 cm left renal cyst is unchanged. There is also a 1.4 cm oval low density in the medial aspect of the kidney measuring 25 Hounsfield units in density, without significant change since 07/11/2016. Portions of the urinary bladder obscured by streak artifacts from bilateral hip prostheses with no visible bladder abnormality. No visible ureteral calculi or dilatation. Stomach/Bowel: Mild fat density wall thickening involving the right colon is unchanged. Multiple proximal small bowel loops near the upper limit of normal in size are again demonstrated with normal caliber distal small bowel and normal caliber colon. No evidence of appendicitis. Vascular/Lymphatic: Atheromatous arterial calcifications without aneurysm. No enlarged lymph nodes. Reproductive: Status post hysterectomy. No adnexal masses. Other: Small to moderate amount of free peritoneal fluid. Diffuse subcutaneous edema. Musculoskeletal: Old, healed right rib fractures. Approximately 30% L1 vertebral compression deformity with kyphoplasty material. Interbody and pedicle screw and rod fixation at the L4 through S1 levels. Lumbar and lower thoracic spine degenerative changes. Bilateral hip prostheses. IMPRESSION: 1. Interval moderate-sized right pleural effusion with right lower lobe atelectasis. 2. Stable changes of cirrhosis of the liver with interval post ablation changes and associated splenomegaly and ascites. 3. Diffuse subcutaneous edema, compatible with hypoproteinemia. 4. Stable chronic right UPJ obstruction and nonobstructing right renal calculus. 5. Stable simple left renal cyst and probable complicated left renal cyst. Electronically Signed   By: Claudie Revering M.D.   On: 09/07/2017 17:17   US Abdomen Limited  Result Date: 09/08/2017 CLINICAL DATA:  History of cirrhosis. Please perform abdominal ultrasound and  ultrasound-guided paracentesis as indicated. EXAM: LIMITED ABDOMEN ULTRASOUND FOR ASCITES TECHNIQUE: Limited ultrasound survey for ascites was performed in all four abdominal quadrants. COMPARISON:  CT abdomen pelvis-09/07/2017; CT-guided hepatic lesion microwave ablation - 08/30/2017 FINDINGS: Provided images of the abdomen demonstrates only a trace amount of fluid within the abdomen, too small to allow for safe ultrasound-guided paracentesis. IMPRESSION: Trace amount of intra-abdominal ascites, too small to allow for safe ultrasound-guided paracentesis. Electronically Signed   By: Sandi Mariscal M.D.   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 EXAM: CT-GUIDED PERCUTANEOUS RADIOFREQUENCY (MICROWAVE) ABLATION OF SEGMENT 6 HEPATIC LESION CT-GUIDED PERCUTANEOUS RADIOFREQUENCY (MICROWAVE) ABLATION OF SEGMENT 2 HEPATIC LESION COMPARISON:  MR 06/25/2017 ANESTHESIA/SEDATION: Anesthesia:  General Medications: 1 gram IVzosyn 3.375 g. The antibiotic was administered in an appropriate time interval prior to needle puncture of the skin. CONTRAST:  100 mL Isovue 370 IV PROCEDURE: The procedure, risks, benefits, and alternatives were explained to the patient and spouse. Questions regarding the procedure were encouraged and answered. The patient understands and consents to the procedure. The patient was placed under general anesthesia. Initial unenhanced CT was performed in a left posterior oblique position to localize the liver lesions. These were in apparent on unenhanced scanning. Accordingly, CT abdomen after IV contrast administration acquiring arterial and venous phase imaging was performed. The lesions were localized. The upper abdomen was prepped with chlorhexidine in a sterile fashion, and a sterile drape was applied covering the operative field. A sterile gown and sterile gloves were used  for the procedure. Under CT guidance, a NEUWAVET PR XT Probe was advanced at the inferior margin of the segment 6 lesion. A second NEUWAVET PR XT Probe was advanced to the superior margin of the segment 6 lesion. Probe positioning was confirmed by CT fusion software prior to ablation. Microwave ablation was performed through  both probes concurrently using standard 5 minute cycle. The needles were slowly removed using the tract ablation option. Subsequently, under CT guidance, a NEUWAVET PR XT Probe was advanced across the posterior margin of the segment 2 lesion. A second NEUWAVET PR XT Probe was advanced across the anterior margin of the segment 2 lesion. Probe positioning was confirmed by CT fusion software prior to ablation. For body wall protection, a 5 French multi sidehole Yueh sheath needle was advanced into the peritoneal space anterior to the left hepatic lobe. 300 mL sterile saline were introduced, and during continued drip infusion, Microwave ablation was performed through both probes concurrently using standard 5 minute cycle. The needles were removed. Post-procedural contrast-enhanced abdominal CT was performed. The patient tolerated the procedure well. Patient was extubated and transferred to PACU. COMPLICATIONS: None immediate FINDINGS: Localizing contrast-enhanced CT demonstrates 2.6 cm hepatic segment 6 lesion which is hypervascular on arterial phase, and a similarly hypervascular 2.2 cm segment 2 lesion, corresponding to the findings seen on previous MRI. No new lesions are evident. Stable subcentimeter segment 6 cyst. A nodular liver contour consistent with known cirrhosis. Trace perisplenic ascites. Splenomegaly. Cholecystectomy clips noted. Coarse pancreatic parenchymal calcifications. On post treatment CT fusion analysis, the ablation zones encompassed the entirety of both lesions. No evidence of hemorrhage or other complication. IMPRESSION: 1. Technically successful CT guided percutaneous  microwave ablation of hepatic segment 6 hepatocellular carcinoma. 2. Technically successful CT-guided percutaneous microwave ablation of hepatic segment 2 hepatocellular carcinoma. 3. The patient will be observed overnight. Initial follow-up will be performed in approximately 4 weeks. Electronically Signed   By: Lucrezia Europe M.D.   On: 08/30/2017 14:25   Dg Chest Port 1 View  Result Date: 09/16/2017 CLINICAL DATA:  Patient status post thoracentesis 09/12/2017 for a right pleural effusion. Severe shortness of breath today. EXAM: PORTABLE CHEST 1 VIEW COMPARISON:  Single-view of the chest 09/12/2017. FINDINGS: The patient has a moderately large right pleural effusion which has markedly increased in size since the comparison examination. Associated basilar airspace disease is noted. The left lung is clear. There is cardiomegaly. IMPRESSION: Moderately large right pleural effusion is markedly increased since the post thoracentesis exam. Electronically Signed   By: Inge Rise M.D.   On: 09/16/2017 13:18   Dg Chest Port 1 View  Result Date: 09/10/2017 CLINICAL DATA:  Shortness of breath.  History of hepatic malignancy. EXAM: PORTABLE CHEST 1 VIEW COMPARISON:  09/07/2017 and prior radiographs.  Prior CTs. FINDINGS: An enlarging moderate right pleural effusion is noted with increasing moderate right lower lung atelectasis/consolidation. The left lung is clear. The cardiomediastinal silhouette is otherwise unchanged. No pneumothorax or acute bony abnormality. Right shoulder arthroplasty changes again identified. IMPRESSION: 1. Enlarging moderate right pleural effusion and moderate right lower lung atelectasis/consolidation. Electronically Signed   By: Margarette Canada M.D.   On: 09/10/2017 12:58   Dg Abd 2 Views  Result Date: 09/13/2017 CLINICAL DATA:  72 year old with hepatocellular carcinoma, hepatic cirrhosis, presenting with marked abdominal distention and generalized abdominal pain. EXAM: ABDOMEN - 2 VIEW  COMPARISON:  09/09/2017 and CT abdomen and pelvis 09/07/2017. FINDINGS: Bowel gas pattern unremarkable without evidence of obstruction or significant ileus. No evidence of free air or significant air-fluid levels on the erect image. Expected stool burden in the colon. Surgical clips in the right upper quadrant from prior cholecystectomy. Generalized haziness of the abdomen indicates ascites. Bilateral renal calculi identified on the CT are vaguely visible on the current x-ray. Prior L1 vertebral augmentation. Prior L4  through S1 fusion. Prior bilateral hip arthroplasties with anatomic alignment in the AP projection. IMPRESSION: 1. Ascites.  No acute abdominal abnormality otherwise. 2. Bilateral renal calculi as noted on the recent CT are vaguely visible on the x-ray. Electronically Signed   By: Evangeline Dakin M.D.   On: 09/13/2017 15:11   Dg Abd Portable 1v  Result Date: 09/09/2017 CLINICAL DATA:  Abdominal pain EXAM: PORTABLE ABDOMEN - 1 VIEW COMPARISON:  09/07/2017 FINDINGS: Scattered large and small bowel gas is noted. Postsurgical changes in the hips and lumbar spine are noted. Changes of prior vertebral augmentation are again seen. No free air is noted. No obstructive changes are noted. IMPRESSION: No acute abnormality noted. Electronically Signed   By: Inez Catalina M.D.   On: 09/09/2017 07:21   Ir Abdomen US Limited  Result Date: 09/13/2017 CLINICAL DATA:  72 year old female with ascites. EXAM: LIMITED ABDOMEN ULTRASOUND FOR ASCITES TECHNIQUE: Limited ultrasound survey for ascites was performed in all four abdominal quadrants. COMPARISON:  Prior attempted paracentesis 09/08/2017 FINDINGS: Sonographic interrogation of the 4 quadrants of the abdomen demonstrates trace ascites. There is insufficient ascites for paracentesis. IMPRESSION: Trace ascites, insufficient for paracentesis. Electronically Signed   By: Jacqulynn Cadet M.D.   On: 09/13/2017 17:23   Ir Thoracentesis Asp Pleural Space W/img  Guide  Result Date: 09/12/2017 INDICATION: Patient with dyspnea and history of hepatic malignancy. Right-sided pleural effusion noted on recent imaging. Request is made for diagnostic and therapeutic thoracentesis. EXAM: ULTRASOUND GUIDED DIAGNOSTIC AND THERAPEUTIC THORACENTESIS MEDICATIONS: 2% lidocaine COMPLICATIONS: None immediate. PROCEDURE: An ultrasound guided thoracentesis was thoroughly discussed with the patient and questions answered. The benefits, risks, alternatives and complications were also discussed. The patient understands and wishes to proceed with the procedure. Written consent was obtained. Ultrasound was performed to localize and mark an adequate pocket of fluid in the right chest. The area was then prepped and draped in the normal sterile fashion. 2% Lidocaine was used for local anesthesia. Under ultrasound guidance a Safe-T-Centesis catheter was introduced. Thoracentesis was performed. The catheter was removed and a dressing applied. FINDINGS: A total of approximately 1.5 L of slightly cloudy serous fluid was removed. Samples were sent to the laboratory as requested by the clinical team. IMPRESSION: Successful ultrasound guided right thoracentesis yielding 1.5 L of pleural fluid. Procedure was terminated slightly early secondary to chest pain and coughing. Read by: Saverio Danker, PA-C Electronically Signed   By: Markus Daft M.D.   On: 09/12/2017 13:32     Discharge Exam: BP (!) 137/50 (BP Location: Right Arm)   Pulse 93   Temp 98.3 F (36.8 C) (Oral)   Resp 20   Ht 5' 2.5" (1.588 m)   Wt 84.5 kg (186 lb 4.6 oz)   SpO2 94%   BMI 33.53 kg/m    General-lying in bed, no distress Cardiovascular-regular rate and rhythm, no appreciable murmurs Respiratory-4 L oxygen, no accessory muscle use, minimal crackles at bases, no appreciable wheezing, occasional rhonchi Gastrointestinal-soft, distended abdomen, bowel sounds present, nontender on palpation Extremities-no peripheral edema,  chronic venous stasis changes on left lower extremity  Discharge Instructions You were cared for by a hospitalist during your hospital stay. If you have any questions about your discharge medications or the care you received while you were in the hospital after you are discharged, you can call the unit and asked to speak with the hospitalist on call if the hospitalist that took care of you is not available. Once you are discharged, your primary care  physician will handle any further medical issues. Please note that NO REFILLS for any discharge medications will be authorized once you are discharged, as it is imperative that you return to your primary care physician (or establish a relationship with a primary care physician if you do not have one) for your aftercare needs so that they can reassess your need for medications and monitor your lab values.  Discharge Instructions    Diet - low sodium heart healthy   Complete by:  As directed    Increase activity slowly   Complete by:  As directed      Allergies as of 09/13/2017      Reactions   Atorvastatin Other (See Comments)   Unknown reaction   Diphenhydramine Hcl (sleep) Hives   Hydrocodone-acetaminophen Other (See Comments)   Unknown reaction   Lopid [gemfibrozil] Other (See Comments)   Unknown reaction   Loratadine Hives   Lorazepam Hives   Simvastatin Other (See Comments)   Unknown reaction   Sulfamethoxazole Hives   Sulfonamide Derivatives Hives      Medication List    STOP taking these medications   furosemide 80 MG tablet Commonly known as:  LASIX     TAKE these medications   albuterol 108 (90 Base) MCG/ACT inhaler Commonly known as:  PROVENTIL HFA;VENTOLIN HFA Inhale 2 puffs into the lungs every 6 (six) hours as needed for wheezing or shortness of breath.   allopurinol 300 MG tablet Commonly known as:  ZYLOPRIM take 1/2-1 tablet by mouth daily for gout What changed:  See the new instructions.   amoxicillin-clavulanate  875-125 MG tablet Commonly known as:  AUGMENTIN Take 1 tablet by mouth 2 (two) times daily for 8 doses.   aspirin EC 81 MG tablet Take 81 mg by mouth at bedtime.   bisoprolol 10 MG tablet Commonly known as:  ZEBETA Take 1 tablet (10 mg total) by mouth daily. What changed:  when to take this   budesonide 0.25 MG/2ML nebulizer solution Commonly known as:  PULMICORT Take 2 mLs (0.25 mg total) by nebulization 2 (two) times daily.   citalopram 40 MG tablet Commonly known as:  CELEXA Take 1 tablet (40 mg total) by mouth 2 (two) times daily. What changed:  when to take this   cyclobenzaprine 10 MG tablet Commonly known as:  FLEXERIL Take 1 tablet (10 mg total) by mouth 3 (three) times daily as needed for muscle spasms.   dextromethorphan-guaiFENesin 30-600 MG 12hr tablet Commonly known as:  MUCINEX DM Take 1 tablet by mouth 2 (two) times daily as needed for cough.   ferrous sulfate 325 (65 FE) MG tablet Take 325 mg by mouth daily.   gabapentin 600 MG tablet Commonly known as:  NEURONTIN Take 1/2 to 1 tablet 3 times a day for neuropathy pain in legs What changed:    how much to take  how to take this  when to take this  additional instructions   glucose blood test strip Take sugars once daily   ipratropium 0.02 % nebulizer solution Commonly known as:  ATROVENT use 1 vial in nebulizer EVERY 4 HOURS AS NEEDED FOR WHEEZING OR SHORTNESS OF BREATH 75/15=5   lactulose 10 GM/15ML solution Commonly known as:  CHRONULAC take 65ms TWICE DAILY What changed:    how much to take  how to take this  when to take this  additional instructions   Magnesium 250 MG Tabs Take 250 mg by mouth at bedtime.   metolazone 5 MG tablet  Commonly known as:  ZAROXOLYN Take 1 tablet (5 mg total) by mouth once a week. Take once a week with lasix or as directed for fluid overload What changed:    how much to take  when to take this  additional instructions   NONFORMULARY OR  COMPOUNDED ITEM Apply 1 application topically See admin instructions. SALICYLIC ACID ACID (PETROLATUM) 50% OINTMENT-APPLY TO AFFECTED AREA AT BEDTIME PRN DRY SKIN/LEGS (Compounded at [place])   nystatin cream Commonly known as:  MYCOSTATIN Apply 1 application topically 2 (two) times daily. What changed:    when to take this  reasons to take this   ondansetron 4 MG tablet Commonly known as:  ZOFRAN Take 1 tablet (4 mg total) by mouth every 6 (six) hours as needed for nausea or vomiting.   OXYGEN Inhale 2-3 L into the lungs continuous. 3 L with exertion   pantoprazole 40 MG tablet Commonly known as:  PROTONIX Take 1 tablet (40 mg total) by mouth daily. What changed:  when to take this   potassium chloride 10 MEQ tablet Commonly known as:  K-DUR,KLOR-CON Take 3 tablets (30 mEq total) by mouth 3 (three) times daily. What changed:    how much to take  when to take this  additional instructions   rifampin 300 MG capsule Commonly known as:  RIFADIN Take 1 capsule 2 x / day for Liver What changed:    how much to take  how to take this  when to take this  additional instructions   terconazole 0.4 % vaginal cream Commonly known as:  TERAZOL 7 Place 1 applicator vaginally at bedtime. What changed:    when to take this  reasons to take this   torsemide 10 MG tablet Commonly known as:  DEMADEX Take 3 tablets (30 mg total) by mouth every morning.   torsemide 20 MG tablet Commonly known as:  DEMADEX Take 3 tablets (60 mg total) by mouth every evening.   traMADol 50 MG tablet Commonly known as:  ULTRAM Take 50 mg by mouth every 8 hours as needed for pain What changed:    how much to take  how to take this  when to take this  reasons to take this  additional instructions   triamcinolone cream 0.1 % Commonly known as:  KENALOG Apply 1 application topically 3 (three) times daily as needed for pain What changed:    how much to take  how  to take this  when to take this  reasons to take this  additional instructions   Vitamin D3 5000 units Caps Take 5,000 Units by mouth daily with breakfast.     ASK your doctor about these medications   predniSONE 20 MG tablet Commonly known as:  DELTASONE Take 2 tablets (40 mg total) by mouth daily with breakfast for 1 day. Ask about: Should I take this medication?      Allergies  Allergen Reactions  . Atorvastatin Other (See Comments)    Unknown reaction  . Diphenhydramine Hcl (Sleep) Hives  . Hydrocodone-Acetaminophen Other (See Comments)    Unknown reaction  . Lopid [Gemfibrozil] Other (See Comments)    Unknown reaction  . Loratadine Hives  . Lorazepam Hives  . Simvastatin Other (See Comments)    Unknown reaction  . Sulfamethoxazole Hives  . Sulfonamide Derivatives Hives      The results of significant diagnostics from this hospitalization (including imaging, microbiology, ancillary and laboratory) are listed below for reference.    Significant  Diagnostic Studies: Dg Chest 1 View  Result Date: 09/12/2017 CLINICAL DATA:  Status post thoracentesis EXAM: CHEST 1 VIEW COMPARISON:  09/10/2017 radiographs FINDINGS: Reduced size of right pleural effusion. No visible pneumothorax. Re-expansion of significant portions of the right lung. There is some persistent peripheral opacity at the right lung base likely representing residual effusion. The left lung remains clear. Cervical plate and screw fixators. Right proximal humeral prosthesis. Aortoiliac atherosclerotic vascular disease. IMPRESSION: 1. Significant reduction in size of the right pleural effusion and associated re-expansion of portions of the right lung. Small persistent effusion peripherally at the right lung base. No pneumothorax or complicating feature. 2.  Aortic Atherosclerosis (ICD10-I70.0). Electronically Signed   By: Van Clines M.D.   On: 09/12/2017 12:05   Dg Chest 1 View  Result Date:  08/30/2017 CLINICAL DATA:  Preop ablation EXAM: CHEST 1 VIEW COMPARISON:  12/07/2016 FINDINGS: Heart is upper limits normal in size. Minimal bibasilar atelectasis or scarring. No effusions. No acute bony abnormality. IMPRESSION: Minimal bibasilar atelectasis or scarring. Electronically Signed   By: Rolm Baptise M.D.   On: 08/30/2017 08:44   Dg Chest 2 View  Result Date: 09/07/2017 CLINICAL DATA:  Chest pain EXAM: CHEST  2 VIEW COMPARISON:  08/30/2017 FINDINGS: Small to moderate layering right pleural effusion. Left lung is clear. No frank interstitial edema. No pneumothorax. The heart is normal in size. Mild degenerative changes of the visualized thoracolumbar spine. L1 vertebral augmentation. Cervical spine fixation hardware. IMPRESSION: Small to moderate layering right pleural effusion, new. Electronically Signed   By: Julian Hy M.D.   On: 09/07/2017 15:49   Ct Abdomen Pelvis W Contrast  Result Date: 09/07/2017 CLINICAL DATA:  Abdominal pain, swelling and shortness of breath. Status post liver cancer surgery. EXAM: CT ABDOMEN AND PELVIS WITH CONTRAST TECHNIQUE: Multidetector CT imaging of the abdomen and pelvis was performed using the standard protocol following bolus administration of intravenous contrast. CONTRAST:  67m ISOVUE-300 IOPAMIDOL (ISOVUE-300) INJECTION 61% COMPARISON:  Liver ablation CT dated 08/30/2017. Chest CT dated 07/02/2017. Abdomen and pelvis CT dated 07/11/2016. Abdomen and pelvis CT dated 07/01/2012. FINDINGS: Lower chest: Interval moderate-sized right pleural effusion with right lower lobe atelectasis. Hepatobiliary: Stable nodular liver contours with a small right lobe and enlarged lateral segment left lobe and caudate lobe. Interval post ablation changes in the right lobe and lateral segment left lobe. No visible mass separate from these changes at either location. Cholecystectomy clips. Pancreas: Unremarkable. No pancreatic ductal dilatation or surrounding inflammatory  changes. Spleen: Diffusely enlarged, without significant change. Adrenals/Urinary Tract: Stable dilatation of the right renal collecting system without ureteral dilatation. A cortical scar in the mid to upper right kidney containing a 6 mm calcification is unchanged. There is also an additional 5 mm right renal calculus more inferiorly without significant change. A 2.0 cm left renal cyst is unchanged. There is also a 1.4 cm oval low density in the medial aspect of the kidney measuring 25 Hounsfield units in density, without significant change since 07/11/2016. Portions of the urinary bladder obscured by streak artifacts from bilateral hip prostheses with no visible bladder abnormality. No visible ureteral calculi or dilatation. Stomach/Bowel: Mild fat density wall thickening involving the right colon is unchanged. Multiple proximal small bowel loops near the upper limit of normal in size are again demonstrated with normal caliber distal small bowel and normal caliber colon. No evidence of appendicitis. Vascular/Lymphatic: Atheromatous arterial calcifications without aneurysm. No enlarged lymph nodes. Reproductive: Status post hysterectomy. No adnexal masses. Other: Small to  moderate amount of free peritoneal fluid. Diffuse subcutaneous edema. Musculoskeletal: Old, healed right rib fractures. Approximately 30% L1 vertebral compression deformity with kyphoplasty material. Interbody and pedicle screw and rod fixation at the L4 through S1 levels. Lumbar and lower thoracic spine degenerative changes. Bilateral hip prostheses. IMPRESSION: 1. Interval moderate-sized right pleural effusion with right lower lobe atelectasis. 2. Stable changes of cirrhosis of the liver with interval post ablation changes and associated splenomegaly and ascites. 3. Diffuse subcutaneous edema, compatible with hypoproteinemia. 4. Stable chronic right UPJ obstruction and nonobstructing right renal calculus. 5. Stable simple left renal cyst and  probable complicated left renal cyst. Electronically Signed   By: Claudie Revering M.D.   On: 09/07/2017 17:17   US Abdomen Limited  Result Date: 09/08/2017 CLINICAL DATA:  History of cirrhosis. Please perform abdominal ultrasound and ultrasound-guided paracentesis as indicated. EXAM: LIMITED ABDOMEN ULTRASOUND FOR ASCITES TECHNIQUE: Limited ultrasound survey for ascites was performed in all four abdominal quadrants. COMPARISON:  CT abdomen pelvis-09/07/2017; CT-guided hepatic lesion microwave ablation - 08/30/2017 FINDINGS: Provided images of the abdomen demonstrates only a trace amount of fluid within the abdomen, too small to allow for safe ultrasound-guided paracentesis. IMPRESSION: Trace amount of intra-abdominal ascites, too small to allow for safe ultrasound-guided paracentesis. Electronically Signed   By: Sandi Mariscal M.D.   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 EXAM: CT-GUIDED PERCUTANEOUS RADIOFREQUENCY (MICROWAVE) ABLATION OF SEGMENT 6 HEPATIC LESION CT-GUIDED PERCUTANEOUS RADIOFREQUENCY (MICROWAVE) ABLATION OF SEGMENT 2 HEPATIC LESION COMPARISON:  MR 06/25/2017 ANESTHESIA/SEDATION: Anesthesia:  General Medications: 1 gram IVzosyn 3.375 g. The antibiotic was administered in an appropriate time interval prior to needle puncture of the skin. CONTRAST:  100 mL Isovue 370 IV PROCEDURE: The procedure, risks, benefits, and alternatives were explained to the patient and spouse. Questions regarding the procedure were encouraged and answered. The patient understands and consents to the procedure. The patient was placed under general anesthesia. Initial unenhanced CT was performed in a left posterior oblique position to localize the liver lesions. These were in apparent on unenhanced scanning. Accordingly, CT abdomen after IV contrast administration acquiring arterial and venous  phase imaging was performed. The lesions were localized. The upper abdomen was prepped with chlorhexidine in a sterile fashion, and a sterile drape was applied covering the operative field. A sterile gown and sterile gloves were used for the procedure. Under CT guidance, a NEUWAVET PR XT Probe was advanced at the inferior margin of the segment 6 lesion. A second NEUWAVET PR XT Probe was advanced to the superior margin of the segment 6 lesion. Probe positioning was confirmed by CT fusion software prior to ablation. Microwave ablation was performed through both probes concurrently using standard 5 minute cycle. The needles were slowly removed using the tract ablation option. Subsequently, under CT guidance, a NEUWAVET PR XT Probe was advanced across the posterior margin of the segment 2 lesion. A second NEUWAVET PR XT Probe was advanced across the anterior margin of the segment 2 lesion. Probe positioning was confirmed by CT fusion software prior to ablation. For body wall protection, a 5 French multi sidehole Yueh sheath needle was advanced into the peritoneal space anterior to the left hepatic lobe. 300 mL sterile saline were introduced, and during continued drip infusion, Microwave ablation was performed through both probes concurrently using standard 5 minute cycle. The needles were removed. Post-procedural contrast-enhanced abdominal CT was performed. The patient  tolerated the procedure well. Patient was extubated and transferred to PACU. COMPLICATIONS: None immediate FINDINGS: Localizing contrast-enhanced CT demonstrates 2.6 cm hepatic segment 6 lesion which is hypervascular on arterial phase, and a similarly hypervascular 2.2 cm segment 2 lesion, corresponding to the findings seen on previous MRI. No new lesions are evident. Stable subcentimeter segment 6 cyst. A nodular liver contour consistent with known cirrhosis. Trace perisplenic ascites. Splenomegaly. Cholecystectomy clips noted. Coarse pancreatic  parenchymal calcifications. On post treatment CT fusion analysis, the ablation zones encompassed the entirety of both lesions. No evidence of hemorrhage or other complication. IMPRESSION: 1. Technically successful CT guided percutaneous microwave ablation of hepatic segment 6 hepatocellular carcinoma. 2. Technically successful CT-guided percutaneous microwave ablation of hepatic segment 2 hepatocellular carcinoma. 3. The patient will be observed overnight. Initial follow-up will be performed in approximately 4 weeks. Electronically Signed   By: Lucrezia Europe M.D.   On: 08/30/2017 14:25   Dg Chest Port 1 View  Result Date: 09/16/2017 CLINICAL DATA:  Patient status post thoracentesis 09/12/2017 for a right pleural effusion. Severe shortness of breath today. EXAM: PORTABLE CHEST 1 VIEW COMPARISON:  Single-view of the chest 09/12/2017. FINDINGS: The patient has a moderately large right pleural effusion which has markedly increased in size since the comparison examination. Associated basilar airspace disease is noted. The left lung is clear. There is cardiomegaly. IMPRESSION: Moderately large right pleural effusion is markedly increased since the post thoracentesis exam. Electronically Signed   By: Inge Rise M.D.   On: 09/16/2017 13:18   Dg Chest Port 1 View  Result Date: 09/10/2017 CLINICAL DATA:  Shortness of breath.  History of hepatic malignancy. EXAM: PORTABLE CHEST 1 VIEW COMPARISON:  09/07/2017 and prior radiographs.  Prior CTs. FINDINGS: An enlarging moderate right pleural effusion is noted with increasing moderate right lower lung atelectasis/consolidation. The left lung is clear. The cardiomediastinal silhouette is otherwise unchanged. No pneumothorax or acute bony abnormality. Right shoulder arthroplasty changes again identified. IMPRESSION: 1. Enlarging moderate right pleural effusion and moderate right lower lung atelectasis/consolidation. Electronically Signed   By: Margarette Canada M.D.   On:  09/10/2017 12:58   Dg Abd 2 Views  Result Date: 09/13/2017 CLINICAL DATA:  72 year old with hepatocellular carcinoma, hepatic cirrhosis, presenting with marked abdominal distention and generalized abdominal pain. EXAM: ABDOMEN - 2 VIEW COMPARISON:  09/09/2017 and CT abdomen and pelvis 09/07/2017. FINDINGS: Bowel gas pattern unremarkable without evidence of obstruction or significant ileus. No evidence of free air or significant air-fluid levels on the erect image. Expected stool burden in the colon. Surgical clips in the right upper quadrant from prior cholecystectomy. Generalized haziness of the abdomen indicates ascites. Bilateral renal calculi identified on the CT are vaguely visible on the current x-ray. Prior L1 vertebral augmentation. Prior L4 through S1 fusion. Prior bilateral hip arthroplasties with anatomic alignment in the AP projection. IMPRESSION: 1. Ascites.  No acute abdominal abnormality otherwise. 2. Bilateral renal calculi as noted on the recent CT are vaguely visible on the x-ray. Electronically Signed   By: Evangeline Dakin M.D.   On: 09/13/2017 15:11   Dg Abd Portable 1v  Result Date: 09/09/2017 CLINICAL DATA:  Abdominal pain EXAM: PORTABLE ABDOMEN - 1 VIEW COMPARISON:  09/07/2017 FINDINGS: Scattered large and small bowel gas is noted. Postsurgical changes in the hips and lumbar spine are noted. Changes of prior vertebral augmentation are again seen. No free air is noted. No obstructive changes are noted. IMPRESSION: No acute abnormality noted. Electronically Signed   By: Elta Guadeloupe  Lukens M.D.   On: 09/09/2017 07:21   Ir Abdomen US Limited  Result Date: 09/13/2017 CLINICAL DATA:  72 year old female with ascites. EXAM: LIMITED ABDOMEN ULTRASOUND FOR ASCITES TECHNIQUE: Limited ultrasound survey for ascites was performed in all four abdominal quadrants. COMPARISON:  Prior attempted paracentesis 09/08/2017 FINDINGS: Sonographic interrogation of the 4 quadrants of the abdomen demonstrates trace  ascites. There is insufficient ascites for paracentesis. IMPRESSION: Trace ascites, insufficient for paracentesis. Electronically Signed   By: Jacqulynn Cadet M.D.   On: 09/13/2017 17:23   Ir Thoracentesis Asp Pleural Space W/img Guide  Result Date: 09/12/2017 INDICATION: Patient with dyspnea and history of hepatic malignancy. Right-sided pleural effusion noted on recent imaging. Request is made for diagnostic and therapeutic thoracentesis. EXAM: ULTRASOUND GUIDED DIAGNOSTIC AND THERAPEUTIC THORACENTESIS MEDICATIONS: 2% lidocaine COMPLICATIONS: None immediate. PROCEDURE: An ultrasound guided thoracentesis was thoroughly discussed with the patient and questions answered. The benefits, risks, alternatives and complications were also discussed. The patient understands and wishes to proceed with the procedure. Written consent was obtained. Ultrasound was performed to localize and mark an adequate pocket of fluid in the right chest. The area was then prepped and draped in the normal sterile fashion. 2% Lidocaine was used for local anesthesia. Under ultrasound guidance a Safe-T-Centesis catheter was introduced. Thoracentesis was performed. The catheter was removed and a dressing applied. FINDINGS: A total of approximately 1.5 L of slightly cloudy serous fluid was removed. Samples were sent to the laboratory as requested by the clinical team. IMPRESSION: Successful ultrasound guided right thoracentesis yielding 1.5 L of pleural fluid. Procedure was terminated slightly early secondary to chest pain and coughing. Read by: Saverio Danker, PA-C Electronically Signed   By: Markus Daft M.D.   On: 09/12/2017 13:32    Microbiology: Recent Results (from the past 240 hour(s))  Gram stain     Status: None   Collection Time: 09/12/17 11:54 AM  Result Value Ref Range Status   Specimen Description PLEURAL RIGHT  Final   Special Requests NONE  Final   Gram Stain   Final    RARE WBC PRESENT,BOTH PMN AND MONONUCLEAR NO  ORGANISMS SEEN Performed at [facility], [address], [place], [place] [address]    Report Status 09/13/2017 FINAL  Final  Culture, body fluid-bottle     Status: None (Preliminary result)   Collection Time: 09/12/17 11:54 AM  Result Value Ref Range Status   Specimen Description PLEURAL RIGHT  Final   Special Requests NONE  Final   Culture   Final    NO GROWTH 4 DAYS Performed at [facility] [address] [place], [place] [address]    Report Status PENDING  Incomplete     Labs: Basic Metabolic Panel: Recent Labs  Lab 09/11/17 0857 09/16/17 1253  NA 138 139  K 4.5 4.0  CL 98* 100*  CO2 27 28  GLUCOSE 161* 126*  BUN 28* 25*  CREATININE 1.29* 1.33*  CALCIUM 8.9 8.7*   Liver Function Tests: Recent Labs  Lab 09/16/17 1253  AST 32  ALT 28  ALKPHOS 120  BILITOT 1.1  PROT 5.3*  ALBUMIN 2.9*   No results for input(s): LIPASE, AMYLASE in the last 168 hours. No results for input(s): AMMONIA in the last 168 hours. CBC: Recent Labs  Lab 09/13/17 1208 09/16/17 1253  WBC 6.1 6.3  NEUTROABS  --  4.9  HGB 8.7* 9.8*  HCT 28.8* 32.2*  MCV 105.5* 108.8*  PLT 123* 140*   Cardiac  Enzymes: No results for input(s): CKTOTAL, CKMB, CKMBINDEX, TROPONINI in the last 168 hours. BNP: BNP (last 3 results) Recent Labs    11/30/16 1810 09/08/17 1107 09/16/17 1253  BNP 144.7* 144.3* 157.0*    ProBNP (last 3 results) No results for input(s): PROBNP in the last 8760 hours.  CBG: Recent Labs  Lab 09/12/17 1703 09/12/17 2035 09/13/17 0824 09/13/17 1219 09/13/17 1738  GLUCAP 177* 203* 94 164* 161*       Signed:  Desiree Hane, MD [facility] 09/16/2017, 6:28 PM

## 2017-09-13 NOTE — Progress Notes (Signed)
PROGRESS NOTE  [name] JYN:[id number] DOB: 1945-12-04 DOA: 09/07/2017 PCP: Unk Pinto, MD  HPI/Recap of past 24 hours:  [name] is a 72 y.o. year old female with medical history significant for liver cirrhosis, hepatocellular carcinoma, COPD, on chronic lactulose for hepatic encephalopathy who presented on 09/07/2017 with worsening abdominal swelling and shortness of breath and was found to have acute on chronic hypoxic respiratory failure multifactorial etiology.  Subjective  Breathing somewhat labored. No acute events overnight  Assessment/Plan: Active Problems:   Hepatic cirrhosis (HCC)   Essential hypertension   Hyperlipidemia   Venous insufficiency   Acute on chronic respiratory failure with hypoxemia (HCC)   COPD mixed type (HCC)   Anasarca   Hepatocellular carcinoma (HCC)   Ascites   Acute diastolic CHF (congestive heart failure) (HCC)   Pleural effusion on right  Abdominal swelling, history of HCC, liver cirrhosis and ascites Concern for worsening ascites that may be worsening breathing status. No acute abdomen on exam. Still having normal BMS so obstruction thought less likely. Hopeful for diagnostic/therapeutic paracentesis today. Will also obtain abdominal xr to make sure no other etiologies to explain pain  Acute on chronic hypoxic/hypercarbic respiratory failure, multifactorial etiology (COPD exacerbation, CHF exacerbation, OSA/OHS, pNA, right pleural effusion) stable Stable at 4 L oxygen. S/p thoracentesis on 2/7. Increased her torsemide regimen as well but still seems to be labored in her breathing. I believe its do to likely worsening ascites. Will monitor for improvement if able to get paracentesis (depending on if enough ascites present) Continue scheduled inhalers, augmentin, and prednisone  COPD exacerbation, improving Continue scheduled inhaler regimen (budesonide, duo-nebs). Oral prednisone.   Wean oxygen as tolerates  New diastolic  congestive heart failure with exacerbation, unclear if improving Transitioned from IV diuretics to oral torsemide on 2/5.  Weights have not gone up but have also not decreased as I would like to show some improvement in volume status. Will increase evening torsemide to 60 mg so she will now be on total of 90 mg of torsemide which is 1.5 times greater than her home dose of lasix. Expect improvement in fluid out.  Continue to monitor daily weights and BMP  R transudative Pleural effusion Fluid analysis consistent with transudative effusion. Likely secondary to CHF and hepatic hydrothorax. Repeat CXR shows decrease in effusion after thoracentesis ( 1.5 L removed on 2/7)  Likely OSA/obesity hypoventilation syndrome Will need outpatient evaluation for sleep apnea  Liver cirrhosis, and hepatocellular carcinoma, diagnosed 06/2017 Status post CT guided ablation on 08/30/17.  Presenting symptom of anasarca initially concern for possible ascites however abdominal ultrasound showed trace intra-abdominal ascites, insufficient for safe ultrasound-guided paracentesis.  Patient reports some improvement in abdominal swelling with diuresis.  We will continue to monitor.  Of note ammonia on admission 18, patient not confused.   Code Status: Full  Family Communication: Husband at bedside  Disposition Plan: Home once torsemide dose finalized and respiratory status stable  Consultants:  None  Procedures:  TTE on 09/09/17: Preserved EF, grade 2 diastolic dysfunction.  Antimicrobials:  Ceftriaxone: 2/2-2/5  Augmentin 2/6-  Cultures:  None  DVT prophylaxis: Heparin   Objective: Vitals:   09/12/17 2045 09/13/17 0631 09/13/17 0843 09/13/17 1000  BP: (!) 132/93 129/78  (!) 137/50  Pulse: 91 85  93  Resp: 20 19  20   Temp: 98.8 F (37.1 C) 98.2 F (36.8 C)  98.3 F (36.8 C)  TempSrc: Oral Oral  Oral  SpO2: 92% 93% 92% 93%  Weight:  84.5 kg (186 lb 4.6 oz)     Height:        Intake/Output  Summary (Last 24 hours) at 09/13/2017 1203 Last data filed at 09/13/2017 0957 Gross per 24 hour  Intake 1140 ml  Output 850 ml  Net 290 ml   Filed Weights   09/11/17 2135 09/12/17 0137 09/12/17 2045  Weight: 84.6 kg (186 lb 8.2 oz) 84.6 kg (186 lb 8.2 oz) 84.5 kg (186 lb 4.6 oz)    Exam:  General: No acute distress, in bedside chairg Cardiovascular: RRR, with no m,r,or g Respiratory: 4 L nasal cannula, no accessory muscle usage, able to talk in complete sentences, crackles best heard on left base, decreased breath sounds on right,  Abdomen: distended, some tenderness with palpation, no guarding or rebound tenderness Extremities: Chronic venous stasis changes on left lower extremity, no peripheral edema  Data Reviewed: CBC: Recent Labs  Lab 09/07/17 1428 09/07/17 2032 09/08/17 0936  WBC 6.9 5.7 4.4  HGB 9.8* 9.0* 8.1*  HCT 30.4* 28.0* 25.5*  MCV 104.1* 103.7* 105.8*  PLT 124* 110* 852*   Basic Metabolic Panel: Recent Labs  Lab 09/07/17 1428 09/07/17 2032 09/08/17 0936 09/08/17 1107 09/09/17 0712 09/11/17 0857  NA 134*  --  139 137 139 138  K 3.3*  --  4.3 3.9 4.6 4.5  CL 99*  --  103 100* 101 98*  CO2 25  --  26 26 27 27   GLUCOSE 165*  --  91 114* 110* 161*  BUN 19  --  19 20 21* 28*  CREATININE 1.26* 1.27* 1.26* 1.29* 1.35* 1.29*  CALCIUM 8.5*  --  8.6* 8.4* 8.4* 8.9  MG  --  2.0  --  2.0  --   --   PHOS  --  3.7  --  3.8 4.7*  --    GFR: Estimated Creatinine Clearance: 41.4 mL/min (A) (by C-G formula based on SCr of 1.29 mg/dL (H)). Liver Function Tests: Recent Labs  Lab 09/07/17 1428 09/07/17 2032 09/08/17 0936 09/08/17 1107 09/09/17 0712  AST 30  --  33 29  --   ALT 51  --  35 37  --   ALKPHOS 145*  --  129* 131*  --   BILITOT 1.6*  --  1.6* 1.6*  --   PROT 5.3*  --  4.7* 4.8*  --   ALBUMIN 2.7* 2.5* 2.6* 2.7* 2.6*   Recent Labs  Lab 09/07/17 1428  LIPASE 84*   Recent Labs  Lab 09/07/17 2032  AMMONIA 18   Coagulation Profile: Recent Labs   Lab 09/07/17 1428 09/08/17 0936  INR 1.17 1.29   Cardiac Enzymes: No results for input(s): CKTOTAL, CKMB, CKMBINDEX, TROPONINI in the last 168 hours. BNP (last 3 results) No results for input(s): PROBNP in the last 8760 hours. HbA1C: No results for input(s): HGBA1C in the last 72 hours. CBG: Recent Labs  Lab 09/12/17 0741 09/12/17 1221 09/12/17 1703 09/12/17 2035 09/13/17 0824  GLUCAP 106* 99 177* 203* 94   Lipid Profile: No results for input(s): CHOL, HDL, LDLCALC, TRIG, CHOLHDL, LDLDIRECT in the last 72 hours. Thyroid Function Tests: No results for input(s): TSH, T4TOTAL, FREET4, T3FREE, THYROIDAB in the last 72 hours. Anemia Panel: No results for input(s): VITAMINB12, FOLATE, FERRITIN, TIBC, IRON, RETICCTPCT in the last 72 hours. Urine analysis:    Component Value Date/Time   COLORURINE YELLOW 09/07/2017 2036   APPEARANCEUR CLEAR 09/07/2017 2036   LABSPEC 1.015 09/07/2017 2036   PHURINE  6.0 09/07/2017 2036   GLUCOSEU NEGATIVE 09/07/2017 2036   HGBUR SMALL (A) 09/07/2017 2036   BILIRUBINUR NEGATIVE 09/07/2017 2036   KETONESUR NEGATIVE 09/07/2017 2036   PROTEINUR NEGATIVE 09/07/2017 2036   UROBILINOGEN 1 01/13/2015 1145   NITRITE NEGATIVE 09/07/2017 2036   LEUKOCYTESUR NEGATIVE 09/07/2017 2036   Sepsis Labs: @LABRCNTIP (procalcitonin:4,lacticidven:4)  ) Recent Results (from the past 240 hour(s))  Gram stain     Status: None   Collection Time: 09/12/17 11:54 AM  Result Value Ref Range Status   Specimen Description PLEURAL RIGHT  Final   Special Requests NONE  Final   Gram Stain   Final    RARE WBC PRESENT,BOTH PMN AND MONONUCLEAR NO ORGANISMS SEEN Performed at [facility], [place] [address] [place], [place] [address]    Report Status 09/13/2017 FINAL  Final      Studies: No results found.  Scheduled Meds: . allopurinol  300 mg Oral Daily  . amoxicillin-clavulanate  1 tablet Oral BID  . aspirin EC  81 mg Oral QHS  . bisoprolol  10 mg Oral  Daily  . budesonide (PULMICORT) nebulizer solution  0.25 mg Nebulization BID  . cholecalciferol  1,000 Units Oral Daily  . citalopram  40 mg Oral Q supper  . ferrous sulfate  325 mg Oral Daily  . gabapentin  300 mg Oral BID  . heparin  5,000 Units Subcutaneous Q12H  . insulin aspart  0-9 Units Subcutaneous TID WC  . ipratropium-albuterol  3 mL Nebulization TID  . lactulose  10 g Oral Daily  . pantoprazole  40 mg Oral Daily  . predniSONE  40 mg Oral Q breakfast  . rifampin  300 mg Oral BID WC  . torsemide  30 mg Oral Daily  . torsemide  60 mg Oral QPM    Continuous Infusions:   LOS: 6 days     Desiree Hane, MD [facility] Pager [phone]  If 7PM-7AM, please contact night-coverage [email or web address] Password [facility] 09/13/2017, 12:03 PM

## 2017-09-13 NOTE — Progress Notes (Signed)
   09/13/17 1500  Clinical Encounter Type  Visited With Patient and family together  Visit Type Initial  Referral From Nurse  Consult/Referral To Chaplain  Spiritual Encounters  Spiritual Needs Emotional  Stress Factors  Patient Stress Factors Exhausted  Family Stress Factors Exhausted    Pt was not in her room by the time of my visit. Only husband was on-site on my arrival. As soon as We started the conversations about the Pt, she arrived from CT scan. Patient was in great emotional distress and had questions about her prognosis which I was not able to answer. I assured the patient that my presence was for the spiritual care. I provided emotional support through encouragement, reflective listening and compassionate presence.  Zaire Levesque a Medical sales representative, [employer]

## 2017-09-16 ENCOUNTER — Emergency Department (HOSPITAL_COMMUNITY): Payer: PPO

## 2017-09-16 ENCOUNTER — Other Ambulatory Visit: Payer: Self-pay

## 2017-09-16 ENCOUNTER — Inpatient Hospital Stay (HOSPITAL_COMMUNITY)
Admission: EM | Admit: 2017-09-16 | Discharge: 2017-09-28 | DRG: 193 | Disposition: A | Discharge status: Home Health | Payer: PPO | Attending: Internal Medicine | Admitting: Internal Medicine

## 2017-09-16 ENCOUNTER — Encounter (HOSPITAL_COMMUNITY): Payer: Self-pay

## 2017-09-16 DIAGNOSIS — G4733 Obstructive sleep apnea (adult) (pediatric): Secondary | ICD-10-CM | POA: Diagnosis present

## 2017-09-16 DIAGNOSIS — J90 Pleural effusion, not elsewhere classified: Secondary | ICD-10-CM

## 2017-09-16 DIAGNOSIS — I10 Essential (primary) hypertension: Secondary | ICD-10-CM | POA: Diagnosis not present

## 2017-09-16 DIAGNOSIS — Z87891 Personal history of nicotine dependence: Secondary | ICD-10-CM | POA: Diagnosis not present

## 2017-09-16 DIAGNOSIS — J811 Chronic pulmonary edema: Secondary | ICD-10-CM

## 2017-09-16 DIAGNOSIS — Z6836 Body mass index (BMI) 36.0-36.9, adult: Secondary | ICD-10-CM

## 2017-09-16 DIAGNOSIS — J440 Chronic obstructive pulmonary disease with acute lower respiratory infection: Secondary | ICD-10-CM | POA: Diagnosis present

## 2017-09-16 DIAGNOSIS — E1122 Type 2 diabetes mellitus with diabetic chronic kidney disease: Secondary | ICD-10-CM | POA: Diagnosis present

## 2017-09-16 DIAGNOSIS — J9612 Chronic respiratory failure with hypercapnia: Secondary | ICD-10-CM | POA: Diagnosis not present

## 2017-09-16 DIAGNOSIS — J948 Other specified pleural conditions: Secondary | ICD-10-CM | POA: Diagnosis not present

## 2017-09-16 DIAGNOSIS — J181 Lobar pneumonia, unspecified organism: Secondary | ICD-10-CM

## 2017-09-16 DIAGNOSIS — J9610 Chronic respiratory failure, unspecified whether with hypoxia or hypercapnia: Secondary | ICD-10-CM | POA: Diagnosis not present

## 2017-09-16 DIAGNOSIS — K7460 Unspecified cirrhosis of liver: Secondary | ICD-10-CM | POA: Diagnosis not present

## 2017-09-16 DIAGNOSIS — E662 Morbid (severe) obesity with alveolar hypoventilation: Secondary | ICD-10-CM

## 2017-09-16 DIAGNOSIS — R109 Unspecified abdominal pain: Secondary | ICD-10-CM | POA: Diagnosis not present

## 2017-09-16 DIAGNOSIS — R0602 Shortness of breath: Secondary | ICD-10-CM

## 2017-09-16 DIAGNOSIS — R221 Localized swelling, mass and lump, neck: Secondary | ICD-10-CM | POA: Diagnosis not present

## 2017-09-16 DIAGNOSIS — I130 Hypertensive heart and chronic kidney disease with heart failure and stage 1 through stage 4 chronic kidney disease, or unspecified chronic kidney disease: Secondary | ICD-10-CM | POA: Diagnosis not present

## 2017-09-16 DIAGNOSIS — K7469 Other cirrhosis of liver: Secondary | ICD-10-CM | POA: Diagnosis not present

## 2017-09-16 DIAGNOSIS — E785 Hyperlipidemia, unspecified: Secondary | ICD-10-CM | POA: Diagnosis not present

## 2017-09-16 DIAGNOSIS — I8510 Secondary esophageal varices without bleeding: Secondary | ICD-10-CM | POA: Diagnosis not present

## 2017-09-16 DIAGNOSIS — R05 Cough: Secondary | ICD-10-CM | POA: Diagnosis not present

## 2017-09-16 DIAGNOSIS — E669 Obesity, unspecified: Secondary | ICD-10-CM | POA: Diagnosis not present

## 2017-09-16 DIAGNOSIS — R069 Unspecified abnormalities of breathing: Secondary | ICD-10-CM | POA: Diagnosis not present

## 2017-09-16 DIAGNOSIS — Z7189 Other specified counseling: Secondary | ICD-10-CM

## 2017-09-16 DIAGNOSIS — Z515 Encounter for palliative care: Secondary | ICD-10-CM

## 2017-09-16 DIAGNOSIS — E872 Acidosis: Secondary | ICD-10-CM | POA: Diagnosis not present

## 2017-09-16 DIAGNOSIS — J121 Respiratory syncytial virus pneumonia: Secondary | ICD-10-CM | POA: Diagnosis not present

## 2017-09-16 DIAGNOSIS — Z9889 Other specified postprocedural states: Secondary | ICD-10-CM

## 2017-09-16 DIAGNOSIS — Z66 Do not resuscitate: Secondary | ICD-10-CM | POA: Diagnosis present

## 2017-09-16 DIAGNOSIS — D649 Anemia, unspecified: Secondary | ICD-10-CM | POA: Diagnosis present

## 2017-09-16 DIAGNOSIS — K7581 Nonalcoholic steatohepatitis (NASH): Secondary | ICD-10-CM | POA: Diagnosis not present

## 2017-09-16 DIAGNOSIS — J189 Pneumonia, unspecified organism: Secondary | ICD-10-CM | POA: Diagnosis not present

## 2017-09-16 DIAGNOSIS — J441 Chronic obstructive pulmonary disease with (acute) exacerbation: Secondary | ICD-10-CM | POA: Diagnosis not present

## 2017-09-16 DIAGNOSIS — J9811 Atelectasis: Secondary | ICD-10-CM | POA: Diagnosis not present

## 2017-09-16 DIAGNOSIS — R188 Other ascites: Secondary | ICD-10-CM

## 2017-09-16 DIAGNOSIS — M542 Cervicalgia: Secondary | ICD-10-CM

## 2017-09-16 DIAGNOSIS — I5032 Chronic diastolic (congestive) heart failure: Secondary | ICD-10-CM | POA: Diagnosis not present

## 2017-09-16 DIAGNOSIS — K7290 Hepatic failure, unspecified without coma: Secondary | ICD-10-CM | POA: Diagnosis not present

## 2017-09-16 DIAGNOSIS — R1011 Right upper quadrant pain: Secondary | ICD-10-CM | POA: Diagnosis not present

## 2017-09-16 DIAGNOSIS — Z8672 Personal history of thrombophlebitis: Secondary | ICD-10-CM

## 2017-09-16 DIAGNOSIS — R0902 Hypoxemia: Secondary | ICD-10-CM | POA: Diagnosis not present

## 2017-09-16 DIAGNOSIS — R601 Generalized edema: Secondary | ICD-10-CM | POA: Diagnosis not present

## 2017-09-16 DIAGNOSIS — N183 Chronic kidney disease, stage 3 unspecified: Secondary | ICD-10-CM

## 2017-09-16 DIAGNOSIS — D539 Nutritional anemia, unspecified: Secondary | ICD-10-CM | POA: Diagnosis not present

## 2017-09-16 DIAGNOSIS — R0600 Dyspnea, unspecified: Secondary | ICD-10-CM

## 2017-09-16 DIAGNOSIS — J9621 Acute and chronic respiratory failure with hypoxia: Secondary | ICD-10-CM

## 2017-09-16 DIAGNOSIS — Y95 Nosocomial condition: Secondary | ICD-10-CM | POA: Diagnosis present

## 2017-09-16 DIAGNOSIS — R1310 Dysphagia, unspecified: Secondary | ICD-10-CM | POA: Diagnosis present

## 2017-09-16 DIAGNOSIS — K769 Liver disease, unspecified: Secondary | ICD-10-CM | POA: Diagnosis not present

## 2017-09-16 DIAGNOSIS — J449 Chronic obstructive pulmonary disease, unspecified: Secondary | ICD-10-CM | POA: Diagnosis not present

## 2017-09-16 DIAGNOSIS — E559 Vitamin D deficiency, unspecified: Secondary | ICD-10-CM | POA: Diagnosis not present

## 2017-09-16 DIAGNOSIS — J9611 Chronic respiratory failure with hypoxia: Secondary | ICD-10-CM | POA: Diagnosis not present

## 2017-09-16 DIAGNOSIS — K567 Ileus, unspecified: Secondary | ICD-10-CM

## 2017-09-16 DIAGNOSIS — C220 Liver cell carcinoma: Secondary | ICD-10-CM | POA: Diagnosis not present

## 2017-09-16 LAB — COMPREHENSIVE METABOLIC PANEL
ALT: 28 U/L (ref 14–54)
ANION GAP: 11 (ref 5–15)
AST: 32 U/L (ref 15–41)
Albumin: 2.9 g/dL — ABNORMAL LOW (ref 3.5–5.0)
Alkaline Phosphatase: 120 U/L (ref 38–126)
BUN: 25 mg/dL — ABNORMAL HIGH (ref 6–20)
CHLORIDE: 100 mmol/L — AB (ref 101–111)
CO2: 28 mmol/L (ref 22–32)
Calcium: 8.7 mg/dL — ABNORMAL LOW (ref 8.9–10.3)
Creatinine, Ser: 1.33 mg/dL — ABNORMAL HIGH (ref 0.44–1.00)
GFR, EST AFRICAN AMERICAN: 46 mL/min — AB (ref >60)
GFR, EST NON AFRICAN AMERICAN: 39 mL/min — AB (ref >60)
Glucose, Bld: 126 mg/dL — ABNORMAL HIGH (ref 65–99)
POTASSIUM: 4 mmol/L (ref 3.5–5.1)
Sodium: 139 mmol/L (ref 135–145)
Total Bilirubin: 1.1 mg/dL (ref 0.3–1.2)
Total Protein: 5.3 g/dL — ABNORMAL LOW (ref 6.5–8.1)

## 2017-09-16 LAB — URINALYSIS, ROUTINE W REFLEX MICROSCOPIC
BILIRUBIN URINE: NEGATIVE
Glucose, UA: NEGATIVE mg/dL
HGB URINE DIPSTICK: NEGATIVE
Ketones, ur: NEGATIVE mg/dL
NITRITE: NEGATIVE
PH: 5 (ref 5.0–8.0)
Protein, ur: NEGATIVE mg/dL
SPECIFIC GRAVITY, URINE: 1.008 (ref 1.005–1.030)

## 2017-09-16 LAB — CBC WITH DIFFERENTIAL/PLATELET
BASOS ABS: 0 10*3/uL (ref 0.0–0.1)
Basophils Relative: 1 %
EOS PCT: 1 %
Eosinophils Absolute: 0.1 10*3/uL (ref 0.0–0.7)
HCT: 32.2 % — ABNORMAL LOW (ref 36.0–46.0)
Hemoglobin: 9.8 g/dL — ABNORMAL LOW (ref 12.0–15.0)
LYMPHS PCT: 14 %
Lymphs Abs: 0.9 10*3/uL (ref 0.7–4.0)
MCH: 33.1 pg (ref 26.0–34.0)
MCHC: 30.4 g/dL (ref 30.0–36.0)
MCV: 108.8 fL — AB (ref 78.0–100.0)
MONO ABS: 0.4 10*3/uL (ref 0.1–1.0)
Monocytes Relative: 6 %
Neutro Abs: 4.9 10*3/uL (ref 1.7–7.7)
Neutrophils Relative %: 78 %
PLATELETS: 140 10*3/uL — AB (ref 150–400)
RBC: 2.96 MIL/uL — ABNORMAL LOW (ref 3.87–5.11)
RDW: 16.4 % — AB (ref 11.5–15.5)
WBC: 6.3 10*3/uL (ref 4.0–10.5)

## 2017-09-16 LAB — I-STAT VENOUS BLOOD GAS, ED
ACID-BASE EXCESS: 8 mmol/L — AB (ref 0.0–2.0)
BICARBONATE: 37.1 mmol/L — AB (ref 20.0–28.0)
O2 Saturation: 23 %
TCO2: 39 mmol/L — AB (ref 22–32)
pCO2, Ven: 75.8 mmHg (ref 44.0–60.0)
pH, Ven: 7.298 (ref 7.250–7.430)
pO2, Ven: 19 mmHg — CL (ref 32.0–45.0)

## 2017-09-16 LAB — I-STAT TROPONIN, ED: TROPONIN I, POC: 0 ng/mL (ref 0.00–0.08)

## 2017-09-16 LAB — BRAIN NATRIURETIC PEPTIDE: B NATRIURETIC PEPTIDE 5: 157 pg/mL — AB (ref 0.0–100.0)

## 2017-09-16 MED ORDER — ONDANSETRON HCL 4 MG PO TABS
4.0000 mg | ORAL_TABLET | Freq: Four times a day (QID) | ORAL | Status: DC | PRN
  Administered 2017-09-23 – 2017-09-26 (×2): 4 mg via ORAL
  Filled 2017-09-16 (×2): qty 1

## 2017-09-16 MED ORDER — ALBUTEROL SULFATE (2.5 MG/3ML) 0.083% IN NEBU
5.0000 mg | INHALATION_SOLUTION | Freq: Once | RESPIRATORY_TRACT | Status: AC
  Administered 2017-09-16: 5 mg via RESPIRATORY_TRACT

## 2017-09-16 MED ORDER — FUROSEMIDE 10 MG/ML IJ SOLN
40.0000 mg | Freq: Two times a day (BID) | INTRAMUSCULAR | Status: DC
  Administered 2017-09-17 – 2017-09-18 (×3): 40 mg via INTRAVENOUS
  Filled 2017-09-16 (×3): qty 4

## 2017-09-16 MED ORDER — IPRATROPIUM BROMIDE 0.02 % IN SOLN
0.5000 mg | Freq: Once | RESPIRATORY_TRACT | Status: AC
  Administered 2017-09-16: 0.5 mg via RESPIRATORY_TRACT

## 2017-09-16 MED ORDER — METHYLPREDNISOLONE SODIUM SUCC 125 MG IJ SOLR
125.0000 mg | Freq: Once | INTRAMUSCULAR | Status: AC
  Administered 2017-09-16: 125 mg via INTRAVENOUS
  Filled 2017-09-16: qty 2

## 2017-09-16 MED ORDER — POTASSIUM CHLORIDE CRYS ER 20 MEQ PO TBCR: 20.0000 meq | EXTENDED_RELEASE_TABLET | ORAL | Status: DC

## 2017-09-16 MED ORDER — METHYLPREDNISOLONE SODIUM SUCC 40 MG IJ SOLR
40.0000 mg | Freq: Two times a day (BID) | INTRAMUSCULAR | Status: AC
  Administered 2017-09-17 – 2017-09-21 (×11): 40 mg via INTRAVENOUS
  Filled 2017-09-16 (×11): qty 1

## 2017-09-16 MED ORDER — RIFAMPIN 300 MG PO CAPS
300.0000 mg | ORAL_CAPSULE | Freq: Two times a day (BID) | ORAL | Status: DC
  Administered 2017-09-17 – 2017-09-28 (×22): 300 mg via ORAL
  Filled 2017-09-16 (×25): qty 1

## 2017-09-16 MED ORDER — BUDESONIDE 0.25 MG/2ML IN SUSP
0.2500 mg | Freq: Two times a day (BID) | RESPIRATORY_TRACT | Status: DC
  Administered 2017-09-16: 0.25 mg via RESPIRATORY_TRACT
  Filled 2017-09-16 (×2): qty 2

## 2017-09-16 MED ORDER — LACTULOSE 10 GM/15ML PO SOLN
10.0000 g | Freq: Every day | ORAL | Status: DC
  Administered 2017-09-18 – 2017-09-22 (×5): 10 g via ORAL
  Filled 2017-09-16 (×5): qty 15

## 2017-09-16 MED ORDER — IPRATROPIUM BROMIDE 0.02 % IN SOLN
RESPIRATORY_TRACT | Status: AC
  Administered 2017-09-16: 0.5 mg via RESPIRATORY_TRACT
  Filled 2017-09-16: qty 2.5

## 2017-09-16 MED ORDER — POTASSIUM CHLORIDE CRYS ER 20 MEQ PO TBCR
30.0000 meq | EXTENDED_RELEASE_TABLET | Freq: Every day | ORAL | Status: DC
  Administered 2017-09-18 – 2017-09-20 (×3): 30 meq via ORAL
  Filled 2017-09-16 (×3): qty 1

## 2017-09-16 MED ORDER — IPRATROPIUM BROMIDE 0.02 % IN SOLN
0.5000 mg | Freq: Once | RESPIRATORY_TRACT | Status: AC
  Administered 2017-09-16: 0.5 mg via RESPIRATORY_TRACT
  Filled 2017-09-16: qty 2.5

## 2017-09-16 MED ORDER — GABAPENTIN 600 MG PO TABS
600.0000 mg | ORAL_TABLET | Freq: Three times a day (TID) | ORAL | Status: DC
  Administered 2017-09-17 – 2017-09-20 (×9): 600 mg via ORAL
  Filled 2017-09-16 (×9): qty 1

## 2017-09-16 MED ORDER — ALBUTEROL SULFATE (2.5 MG/3ML) 0.083% IN NEBU
5.0000 mg | INHALATION_SOLUTION | Freq: Once | RESPIRATORY_TRACT | Status: AC
  Administered 2017-09-16: 5 mg via RESPIRATORY_TRACT
  Filled 2017-09-16: qty 6

## 2017-09-16 MED ORDER — POTASSIUM CHLORIDE CRYS ER 20 MEQ PO TBCR
20.0000 meq | EXTENDED_RELEASE_TABLET | ORAL | Status: DC
  Administered 2017-09-17 – 2017-09-27 (×22): 20 meq via ORAL
  Filled 2017-09-16 (×22): qty 1

## 2017-09-16 MED ORDER — ALBUTEROL SULFATE (2.5 MG/3ML) 0.083% IN NEBU
INHALATION_SOLUTION | RESPIRATORY_TRACT | Status: AC
  Administered 2017-09-16: 5 mg via RESPIRATORY_TRACT
  Filled 2017-09-16: qty 6

## 2017-09-16 MED ORDER — FUROSEMIDE 10 MG/ML IJ SOLN
60.0000 mg | Freq: Once | INTRAMUSCULAR | Status: AC
  Administered 2017-09-16: 60 mg via INTRAVENOUS
  Filled 2017-09-16: qty 6

## 2017-09-16 MED ORDER — ALBUTEROL SULFATE (2.5 MG/3ML) 0.083% IN NEBU
2.5000 mg | INHALATION_SOLUTION | Freq: Four times a day (QID) | RESPIRATORY_TRACT | Status: DC
  Administered 2017-09-16 – 2017-09-17 (×5): 2.5 mg via RESPIRATORY_TRACT
  Filled 2017-09-16 (×5): qty 3

## 2017-09-16 MED ORDER — ONDANSETRON HCL 4 MG/2ML IJ SOLN
4.0000 mg | Freq: Four times a day (QID) | INTRAMUSCULAR | Status: DC | PRN
  Administered 2017-09-18 – 2017-09-22 (×3): 4 mg via INTRAVENOUS
  Filled 2017-09-16 (×3): qty 2

## 2017-09-16 NOTE — Progress Notes (Signed)
Patient transported from ED to 2C17 without any complications.

## 2017-09-16 NOTE — ED Notes (Signed)
Respiratory called to give breathing tx on bipap

## 2017-09-16 NOTE — ED Notes (Signed)
DNR sheet bedside. Copy made and placed in medical records

## 2017-09-16 NOTE — ED Notes (Signed)
Xray bedside.

## 2017-09-16 NOTE — ED Provider Notes (Signed)
[facility] EMERGENCY DEPARTMENT Provider Note   CSN: [id number] Arrival date & time: 09/16/17  1244     History   Chief Complaint Chief Complaint  Patient presents with  . Shortness of Breath    HPI [name] is a 72 y.o. female.  Patient is a 72 year old female with multiple medical problems including COPD on home oxygen, hepatic cirrhosis with hepatocellular carcinoma and diffuse large volume ascites, recent diagnosis of CHF and pleural effusion who was recently admitted to the hospital between 09/06/2017 2-819 where she had initiation of IV diuretics, thoracentesis of pleural effusion and treatment of COPD.  Patient has been home for 3 days with worsening shortness of breath and increase of 3 pounds since being home.  Patient was transitioned to oral torsemide which she has been taking regularly.  She has been using her oxygen and breathing treatments at home without improvement.  She states her abdomen is extremely distended and tight.  She is also noticed some swelling mostly of her right leg.  She has had some mild chest pressure but no localized chest pain.  She has had no vomiting and has been passing gas and having bowel movements.  When EMS arrived patient's oxygen saturation was 85% on room air.  She was placed on BiPAP and given 5 mg of albuterol with improvement in her shortness of breath.  Upon arrival here on CPAP oxygen saturation is 98%.  Patient is awake and alert.  She complains of mild diffuse tight abdominal discomfort but no localized pain.  She does not think she is running fever.   The history is provided by the patient, the EMS personnel and the spouse.    Past Medical History:  Diagnosis Date  . Asthma   . Cancer [facility])    liver cancer   . COPD (chronic obstructive pulmonary disease) ([place])    x 3 years   . Depression   . Diabetes mellitus without complication ([place])    diet controlled  not on meds   . Edema    left leg   .  Esophageal varices ([place])   . Fibromyalgia   . Gastritis   . GERD (gastroesophageal reflux disease)   . Hepatic cirrhosis ([place])   . Hepatic encephalopathy ([place])   . History of blood transfusion   . Hyperlipidemia   . Hypertension   . IBS (irritable bowel syndrome)   . Phlebitis    left leg x 2   . Pneumonia    hx of   . Splenomegaly   . Vitamin D deficiency     Patient Active Problem List   Diagnosis Date Noted  . Acute diastolic CHF (congestive heart failure) ([place]) 09/11/2017  . Pleural effusion on right 09/11/2017  . Ascites 09/07/2017  . Hepatocellular carcinoma ([place]) 08/30/2017  . Anasarca 12/14/2016  . COPD mixed type ([place]) 11/30/2016  . Obesity (BMI 30-39.9) 10/12/2016  . Hyperammonemia ([place]) 10/12/2016  . Atrial tachycardia, multifocal ([place])   . Chronic respiratory failure ([place]) 01/19/2016  . Hepatic encephalopathy ([place]) 09/21/2015  . Hypokalemia 09/21/2015  . Acute on chronic respiratory failure with hypoxemia ([place]) 08/07/2015  . Thrombocytopenia ([place]) 08/07/2015  . Venous insufficiency 07/26/2015  . Medication management 12/14/2013  . Essential hypertension   . Hyperlipidemia   . GERD   . Vitamin D deficiency   . IBS   . Fibromyalgia   . Esophageal varices ([place]) 07/08/2012  . COLONIC POLYPS 11/11/2008  . Anemia 05/28/2008  . Hepatic  cirrhosis ([place]) 05/28/2008  . Esophagitis 05/27/2008  . Diverticulosis of large intestine 05/27/2008    Past Surgical History:  Procedure Laterality Date  . ABDOMINAL HYSTERECTOMY    . BACK SURGERY    . CATARACT EXTRACTION, BILATERAL    . CHOLECYSTECTOMY    . ESOPHAGOGASTRODUODENOSCOPY  07/16/2012   Procedure: ESOPHAGOGASTRODUODENOSCOPY (EGD);  Surgeon: Inda Castle, MD;  Location: [facility];  Service: Endoscopy;  Laterality: N/A;  . GASTRIC VARICES BANDING  07/16/2012   Procedure: GASTRIC VARICES BANDING;  Surgeon: Inda Castle, MD;  Location: [facility];  Service: Endoscopy;  Laterality: N/A;  . HIP  ARTHROPLASTY Bilateral   . IR GENERIC HISTORICAL  06/25/2016   IR RADIOLOGIST EVAL & MGMT 06/25/2016 [facility]  . IR GENERIC HISTORICAL  06/27/2016   IR KYPHO LUMBAR INC FX REDUCE BONE BX UNI/BIL CANNULATION INC/IMAGING 06/27/2016 Luanne Bras, MD [facility]  . IR GENERIC HISTORICAL  07/17/2016   IR RADIOLOGIST EVAL & MGMT 07/17/2016 [facility]  . IR RADIOLOGIST EVAL & MGMT  07/10/2017  . IR THORACENTESIS ASP PLEURAL SPACE W/IMG GUIDE  09/12/2017  . NECK SURGERY    . RADIOFREQUENCY ABLATION N/A 08/30/2017   Procedure: CT MICROWAVE THERMAL ABLATION;  Surgeon: Arne Cleveland, MD;  Location: [facility] ORS;  Service: Anesthesiology;  Laterality: N/A;  . SHOULDER SURGERY Right     OB History    No data available       Home Medications    Prior to Admission medications   Medication Sig Start Date End Date Taking? Authorizing Provider  albuterol (PROVENTIL HFA;VENTOLIN HFA) 108 (90 Base) MCG/ACT inhaler Inhale 2 puffs into the lungs every 6 (six) hours as needed for wheezing or shortness of breath. 05/27/16   Ward, Delice Bison, DO  allopurinol (ZYLOPRIM) 300 MG tablet take 1/2-1 tablet by mouth daily for gout Patient taking differently: take 1 tablet (300 mg) by mouth daily with supper - for gout 04/29/17   Unk Pinto, MD  amoxicillin-clavulanate (AUGMENTIN) 875-125 MG tablet Take 1 tablet by mouth 2 (two) times daily for 8 doses. 09/13/17 09/17/17  Oretha Milch D, MD  aspirin EC 81 MG tablet Take 81 mg by mouth at bedtime.    [provider]  bisoprolol (ZEBETA) 10 MG tablet Take 1 tablet (10 mg total) by mouth daily. Patient taking differently: Take 10 mg by mouth daily with supper.  04/09/17   Vicie Mutters, PA-C  budesonide (PULMICORT) 0.25 MG/2ML nebulizer solution Take 2 mLs (0.25 mg total) by nebulization 2 (two) times daily. 09/13/17   Desiree Hane, MD  Cholecalciferol (VITAMIN D3) 5000 units CAPS Take 5,000 Units by mouth daily with breakfast.    [provider]  citalopram (CELEXA) 40 MG tablet Take 1 tablet (40 mg total) by mouth 2 (two) times daily. Patient taking differently: Take 40 mg by mouth daily with supper.  06/18/17   Vicie Mutters, PA-C  cyclobenzaprine (FLEXERIL) 10 MG tablet Take 1 tablet (10 mg total) by mouth 3 (three) times daily as needed for muscle spasms. 03/06/16   Unk Pinto, MD  dextromethorphan-guaiFENesin [facility] DM) 30-600 MG 12hr tablet Take 1 tablet by mouth 2 (two) times daily as needed for cough. 09/13/17   Oretha Milch D, MD  ferrous sulfate 325 (65 FE) MG tablet Take 325 mg by mouth daily.     [provider]  gabapentin (NEURONTIN) 600 MG tablet Take 1/2 to 1 tablet 3 times a day for neuropathy pain in legs Patient taking differently:  Take 600 mg by mouth 3 (three) times daily. for neuropathy pain in legs 06/24/17   Vicie Mutters, PA-C  glucose blood test strip Take sugars once daily 01/21/17   Vicie Mutters, PA-C  ipratropium (ATROVENT) 0.02 % nebulizer solution use 1 vial in nebulizer EVERY 4 HOURS AS NEEDED FOR WHEEZING OR SHORTNESS OF BREATH 75/15=5 08/09/17   Unk Pinto, MD  lactulose ([place]) 10 GM/15ML solution take 44ms TWICE DAILY Patient taking differently: Take 10 g by mouth daily.  08/08/17   MUnk Pinto MD  Magnesium 250 MG TABS Take 250 mg by mouth at bedtime.     [provider]  metolazone (ZAROXOLYN) 5 MG tablet Take 1 tablet (5 mg total) by mouth once a week. Take once a week with lasix or as directed for fluid overload Patient taking differently: Take 2.5 mg by mouth See admin instructions. Take 1/2 tablet (2.5 mg) by mouth daily as needed for weight gain of 2-3 lbs overnight 09/04/17   MUnk Pinto MD  NONFORMULARY OR COMPOUNDED ITEM Apply 1 application topically See admin instructions. SALICYLIC ACID ACID (PETROLATUM) 50% OINTMENT-APPLY TO AFFECTED AREA AT BEDTIME PRN DRY SKIN/LEGS (Compounded at [facility]    [provider]    nystatin cream (MYCOSTATIN) Apply 1 application topically 2 (two) times daily. Patient taking differently: Apply 1 application topically 2 (two) times daily as needed for dry skin (itching).  12/20/16   CVicie Mutters PA-C  ondansetron (ZOFRAN) 4 MG tablet Take 1 tablet (4 mg total) by mouth every 6 (six) hours as needed for nausea or vomiting. 09/02/17   CVicie Mutters PA-C  OXYGEN Inhale 2-3 L into the lungs continuous. 3 L with exertion    [provider]  pantoprazole (PROTONIX) 40 MG tablet Take 1 tablet (40 mg total) by mouth daily. Patient taking differently: Take 40 mg by mouth daily with supper.  04/22/17   MUnk Pinto MD  potassium chloride (K-DUR,KLOR-CON) 10 MEQ tablet Take 3 tablets (30 mEq total) by mouth 3 (three) times daily. Patient taking differently: Take 20-30 mEq by mouth See admin instructions. Take 3 tablets (30 meq) by mouth every morning, take 2 tablets (20 meq) mid afternoon and at bedtime 01/07/17   CVicie Mutters PA-C  rifampin (RIFADIN) 300 MG capsule Take 1 capsule 2 x / day for Liver Patient taking differently: Take 300 mg by mouth 2 (two) times daily with a meal. for Liver 07/03/17 01/31/18  MUnk Pinto MD  terconazole (TERAZOL 7) 0.4 % vaginal cream Place 1 applicator vaginally at bedtime. Patient taking differently: Place 1 applicator vaginally at bedtime as needed (itching).  12/20/16   CVicie Mutters PA-C  torsemide (DEMADEX) 10 MG tablet Take 3 tablets (30 mg total) by mouth every morning. 09/13/17 10/13/17  NOretha MilchD, MD  torsemide (DEMADEX) 20 MG tablet Take 3 tablets (60 mg total) by mouth every evening. 09/14/17 10/14/17  NDesiree Hane MD  traMADol (ULTRAM) 50 MG tablet Take 50 mg by mouth every 8 hours as needed for pain Patient taking differently: Take 50 mg by mouth every 8 (eight) hours as needed (pain).  05/07/17   CVicie Mutters PA-C  triamcinolone cream (KENALOG) 0.1 % Apply 1 application topically 3 (three) times daily as  needed for pain Patient taking differently: Apply 1 application topically 3 (three) times daily as needed (itching).  12/20/16   CVicie Mutters PA-C    Family History Family History  Problem Relation Age of Onset  . Diabetes Brother  deceased  . Heart disease Sister        A Fib  . Heart disease Mother        CHF  . Atrial fibrillation Sister   . Cancer Father        lung cancer   . Cancer Maternal Uncle        unknown type cancer   . Cancer Other        lung cancer   . Colon cancer Neg Hx     Social History Social History   Tobacco Use  . Smoking status: Former Smoker    Packs/day: 1.00    Years: 50.00    Pack years: 50.00    Types: Cigarettes    Last attempt to quit: 11/05/2014    Years since quitting: 2.8  . Smokeless tobacco: Never Used  . Tobacco comment: Quit April 2016  Substance Use Topics  . Alcohol use: No    Alcohol/week: 0.0 oz  . Drug use: No     Allergies   Atorvastatin; Diphenhydramine hcl (sleep); Hydrocodone-acetaminophen; Lopid [gemfibrozil]; Loratadine; Lorazepam; Simvastatin; Sulfamethoxazole; and Sulfonamide derivatives   Review of Systems Review of Systems  All other systems reviewed and are negative.    Physical Exam Updated Vital Signs BP 111/89 (BP Location: Right Arm)   Pulse 78   Temp 98.2 F (36.8 C) (Axillary)   Resp 19   Ht 5' 3"  (1.6 m)   Wt 86.2 kg (190 lb)   SpO2 98%   BMI 33.66 kg/m   Physical Exam  Constitutional: She is oriented to person, place, and time. She appears well-developed and well-nourished. She appears ill. No distress.  HENT:  Head: Normocephalic and atraumatic.  Mouth/Throat: Oropharynx is clear and moist.  Dry mucous membranes  Eyes: Conjunctivae and EOM are normal. Pupils are equal, round, and reactive to light.  Neck: Normal range of motion. Neck supple.  Cardiovascular: Normal rate, regular rhythm and intact distal pulses.  No murmur heard. Pulmonary/Chest: Effort normal. Tachypnea  noted. No respiratory distress. She has wheezes. She has no rales.  Diffuse wheezing on cpap  Abdominal: Soft. She exhibits distension and ascites. Bowel sounds are decreased. There is tenderness. There is no rebound and no guarding.  Tense ascites with diffuse mild tenderness  Musculoskeletal: Normal range of motion. She exhibits edema. She exhibits no tenderness.  2+ pitting edema in the right lower ext and trace in the left.  Skin changes consistent with chronic venous stasis  Neurological: She is alert and oriented to person, place, and time.  Skin: Skin is warm and dry. Capillary refill takes 2 to 3 seconds. No rash noted. No erythema.  Psychiatric: She has a normal mood and affect. Her behavior is normal.  Nursing note and vitals reviewed.    ED Treatments / Results  Labs (all labs ordered are listed, but only abnormal results are displayed) Labs Reviewed  CBC WITH DIFFERENTIAL/PLATELET - Abnormal; Notable for the following components:      Result Value   RBC 2.96 (*)    Hemoglobin 9.8 (*)    HCT 32.2 (*)    MCV 108.8 (*)    RDW 16.4 (*)    Platelets 140 (*)    All other components within normal limits  COMPREHENSIVE METABOLIC PANEL - Abnormal; Notable for the following components:   Chloride 100 (*)    Glucose, Bld 126 (*)    BUN 25 (*)    Creatinine, Ser 1.33 (*)    Calcium 8.7 (*)  Total Protein 5.3 (*)    Albumin 2.9 (*)    GFR calc non Af Amer 39 (*)    GFR calc Af Amer 46 (*)    All other components within normal limits  BRAIN NATRIURETIC PEPTIDE - Abnormal; Notable for the following components:   B Natriuretic Peptide 157.0 (*)    All other components within normal limits  I-STAT VENOUS BLOOD GAS, ED - Abnormal; Notable for the following components:   pCO2, Ven 75.8 (*)    pO2, Ven 19.0 (*)    Bicarbonate 37.1 (*)    TCO2 39 (*)    Acid-Base Excess 8.0 (*)    All other components within normal limits  URINALYSIS, ROUTINE W REFLEX MICROSCOPIC  I-STAT  TROPONIN, ED    EKG  EKG Interpretation  Date/Time:  Monday September 16 2017 12:51:29 EST Ventricular Rate:  78 PR Interval:    QRS Duration: 138 QT Interval:  398 QTC Calculation: 454 R Axis:   17 Text Interpretation:  Sinus rhythm Right bundle branch block No significant change since last tracing Confirmed by Blanchie Dessert ([id number]) on 09/16/2017 1:05:41 PM       Radiology Dg Chest Port 1 View  Result Date: 09/16/2017 CLINICAL DATA:  Patient status post thoracentesis 09/12/2017 for a right pleural effusion. Severe shortness of breath today. EXAM: PORTABLE CHEST 1 VIEW COMPARISON:  Single-view of the chest 09/12/2017. FINDINGS: The patient has a moderately large right pleural effusion which has markedly increased in size since the comparison examination. Associated basilar airspace disease is noted. The left lung is clear. There is cardiomegaly. IMPRESSION: Moderately large right pleural effusion is markedly increased since the post thoracentesis exam. Electronically Signed   By: Inge Rise M.D.   On: 09/16/2017 13:18    Procedures Procedures (including critical care time)  Medications Ordered in ED Medications  ipratropium (ATROVENT) 0.02 % nebulizer solution (not administered)  albuterol (PROVENTIL) (2.5 MG/3ML) 0.083% nebulizer solution (not administered)  albuterol (PROVENTIL) (2.5 MG/3ML) 0.083% nebulizer solution 5 mg (not administered)  ipratropium (ATROVENT) nebulizer solution 0.5 mg (not administered)     Initial Impression / Assessment and Plan / ED Course  I have reviewed the triage vital signs and the nursing notes.  Pertinent labs & imaging results that were available during my care of the patient were reviewed by me and considered in my medical decision making (see chart for details).     Patient is an elderly female with multiple medical problems presenting today with shortness of breath.  Patient is wheezing diffusely with improvement on CPAP and  albuterol given by EMS.  She does have a history of COPD and is on 2 L of oxygen at home.  Patient was initially hypoxic when EMS arrived.  Concern for possible COPD exacerbation versus worsening of CHF which was she was recently diagnosed with a reaccumulation of pleural effusion which was drained by thoracentesis.  Patient also has a large volume tense ascites on exam without a prior paracentesis during her last hospitalization which could also be leading to some of her shortness of breath.  She denies any fever or infectious type symptoms but also possibility that patient has pneumonia.  The possibility of PE as well as patient does have a history of cancer was recently hospitalized and does not take any anticoagulation.  EKG, chest x-ray, CBC, CMP, UA, troponin, VBG pending.  Patient to continue on BiPAP.  Patient has gained 3 pounds since discharge in the last 3 days and may  need IV diuretic as well.  3:49 PM Patient has had reaccumulation of her pleural effusion plus some on the right side she regained her shortness of breath.  Renal function and hemoglobin are stable.  VBG with normal pH and CO2 retention at 75 with compensation.  After albuterol and Atrovent wheezing has improved.  Also feel that she has a component of COPD exacerbation.  Will give a second treatment and Solu-Medrol.  Patient also given Lasix.  Will admit for further care.  CRITICAL CARE Performed by: Aqeel Norgaard Total critical care time: 40 minutes Critical care time was exclusive of separately billable procedures and treating other patients. Critical care was necessary to treat or prevent imminent or life-threatening deterioration. Critical care was time spent personally by me on the following activities: development of treatment plan with patient and/or surrogate as well as nursing, discussions with consultants, evaluation of patient's response to treatment, examination of patient, obtaining history from patient or  surrogate, ordering and performing treatments and interventions, ordering and review of laboratory studies, ordering and review of radiographic studies, pulse oximetry and re-evaluation of patient's condition.   Final Clinical Impressions(s) / ED Diagnoses   Final diagnoses:  COPD exacerbation ([place])  Hypoxia  Pleural effusion  Hepatocellular carcinoma ([facility])  Acute on chronic respiratory failure with hypoxia [facility])    ED Discharge Orders    None       Blanchie Dessert, MD 09/16/17 1550

## 2017-09-16 NOTE — H&P (Signed)
History and Physical    [name] LOV:[id number] DOB: 03-06-1946 DOA: 09/16/2017  PCP: Unk Pinto, MD Patient coming from: home  Chief Complaint: sob/ worsening abdominal distention  HPI: [name] is a 72 y.o. female with medical history significant for liver cancer diagnosed 1-2 months ago, nonalcoholic liver disease with associated cirrhosis, splenomegaly, irritable bowel syndrome, hypertension, esophageal varices, diabetes, COPD recent hospitalization for acute on chronic respiratory failure related to recurrent/worsening pleural effusion in the setting of COPD exacerbation. [facility] are asked to admit.  Information is obtained from the patient and her husband who is at the bedside. She was discharged 3 days ago after hospitalization for same. Husband reports patient did well until yesterday she developed worsening shortness of breath. Associated symptoms include wet sounding nonproductive cough dyspnea generalized weakness and 3 pound weight gain. He reports compliance with her medications. She denies chest pain palpitations headache dizziness syncope or near-syncope. No nausea vomiting diarrhea constipation melena bright red blood per rectum. No dysuria hematuria frequency or urgency. The EMS was called reportedly oxygen saturation levels 85% on room air. She was placed on BiPAP and provided with nebulizers. On arrival to the emergency department transistioned to cpap and saturation level 98%.   ED Course: In the emergency department she's afebrile hemodynamically stable. Oxygen saturation level 98%. She is provided with Lasix , Solu-Medrol nebulizers.  Review of Systems: As per HPI otherwise all other systems reviewed and are negative.   Ambulatory Status: Ambulates with a walker  Past Medical History:  Diagnosis Date  . Asthma   . Cancer [facility])    liver cancer   . COPD (chronic obstructive pulmonary disease) ([place])    x 3 years   . Depression   .  Diabetes mellitus without complication ([place])    diet controlled  not on meds   . Edema    left leg   . Esophageal varices ([place])   . Fibromyalgia   . Gastritis   . GERD (gastroesophageal reflux disease)   . Hepatic cirrhosis ([place])   . Hepatic encephalopathy ([place])   . History of blood transfusion   . Hyperlipidemia   . Hypertension   . IBS (irritable bowel syndrome)   . Phlebitis    left leg x 2   . Pneumonia    hx of   . Splenomegaly   . Vitamin D deficiency     Past Surgical History:  Procedure Laterality Date  . ABDOMINAL HYSTERECTOMY    . BACK SURGERY    . CATARACT EXTRACTION, BILATERAL    . CHOLECYSTECTOMY    . ESOPHAGOGASTRODUODENOSCOPY  07/16/2012   Procedure: ESOPHAGOGASTRODUODENOSCOPY (EGD);  Surgeon: Inda Castle, MD;  Location: [facility];  Service: Endoscopy;  Laterality: N/A;  . GASTRIC VARICES BANDING  07/16/2012   Procedure: GASTRIC VARICES BANDING;  Surgeon: Inda Castle, MD;  Location: [facility];  Service: Endoscopy;  Laterality: N/A;  . HIP ARTHROPLASTY Bilateral   . IR GENERIC HISTORICAL  06/25/2016   IR RADIOLOGIST EVAL & MGMT 06/25/2016 [facility]  . IR GENERIC HISTORICAL  06/27/2016   IR KYPHO LUMBAR INC FX REDUCE BONE BX UNI/BIL CANNULATION INC/IMAGING 06/27/2016 Luanne Bras, MD [facility]  . IR GENERIC HISTORICAL  07/17/2016   IR RADIOLOGIST EVAL & MGMT 07/17/2016 [facility]  . IR RADIOLOGIST EVAL & MGMT  07/10/2017  . IR THORACENTESIS ASP PLEURAL SPACE W/IMG GUIDE  09/12/2017  . NECK SURGERY    . RADIOFREQUENCY ABLATION N/A 08/30/2017   Procedure:  CT MICROWAVE THERMAL ABLATION;  Surgeon: Arne Cleveland, MD;  Location: [facility] ORS;  Service: Anesthesiology;  Laterality: N/A;  . SHOULDER SURGERY Right     Social History   Socioeconomic History  . Marital status: Married    Spouse name: [name]  . Number of children: 5  . Years of education: Not on file  . Highest education level: Not on file  Social Needs  . Financial  resource strain: Not on file  . Food insecurity - worry: Not on file  . Food insecurity - inability: Not on file  . Transportation needs - medical: Not on file  . Transportation needs - non-medical: Not on file  Occupational History  . Occupation: Arboriculturist: UNEMPLOYED  Tobacco Use  . Smoking status: Former Smoker    Packs/day: 1.00    Years: 50.00    Pack years: 50.00    Types: Cigarettes    Last attempt to quit: 11/05/2014    Years since quitting: 2.8  . Smokeless tobacco: Never Used  . Tobacco comment: Quit April 2016  Substance and Sexual Activity  . Alcohol use: No    Alcohol/week: 0.0 oz  . Drug use: No  . Sexual activity: Not on file  Other Topics Concern  . Not on file  Social History Narrative  . Not on file    Allergies  Allergen Reactions  . Atorvastatin Other (See Comments)    Unknown reaction  . Diphenhydramine Hcl (Sleep) Hives  . Hydrocodone-Acetaminophen Other (See Comments)    Unknown reaction  . Lopid [Gemfibrozil] Other (See Comments)    Unknown reaction  . Loratadine Hives  . Lorazepam Hives  . Simvastatin Other (See Comments)    Unknown reaction  . Sulfamethoxazole Hives  . Sulfonamide Derivatives Hives    Family History  Problem Relation Age of Onset  . Diabetes Brother        deceased  . Heart disease Sister        A Fib  . Heart disease Mother        CHF  . Atrial fibrillation Sister   . Cancer Father        lung cancer   . Cancer Maternal Uncle        unknown type cancer   . Cancer Other        lung cancer   . Colon cancer Neg Hx     Prior to Admission medications   Medication Sig Start Date End Date Taking? Authorizing Provider  albuterol (PROVENTIL HFA;VENTOLIN HFA) 108 (90 Base) MCG/ACT inhaler Inhale 2 puffs into the lungs every 6 (six) hours as needed for wheezing or shortness of breath. 05/27/16  Yes Ward, Delice Bison, DO  allopurinol (ZYLOPRIM) 300 MG tablet take 1/2-1 tablet by mouth daily for gout Patient  taking differently: take 1 tablet (300 mg) by mouth daily with supper - for gout 04/29/17  Yes Unk Pinto, MD  aspirin EC 81 MG tablet Take 81 mg by mouth at bedtime.   Yes [provider]  bisoprolol (ZEBETA) 10 MG tablet Take 1 tablet (10 mg total) by mouth daily. Patient taking differently: Take 10 mg by mouth daily with supper.  04/09/17  Yes Vicie Mutters, PA-C  budesonide (PULMICORT) 0.25 MG/2ML nebulizer solution Take 2 mLs (0.25 mg total) by nebulization 2 (two) times daily. 09/13/17  Yes Oretha Milch D, MD  Cholecalciferol (VITAMIN D3) 5000 units CAPS Take 5,000 Units by mouth daily with breakfast.   Yes [provider]  citalopram (CELEXA) 40 MG tablet Take 1 tablet (40 mg total) by mouth 2 (two) times daily. Patient taking differently: Take 40 mg by mouth daily with supper.  06/18/17  Yes Vicie Mutters, PA-C  cyclobenzaprine (FLEXERIL) 10 MG tablet Take 1 tablet (10 mg total) by mouth 3 (three) times daily as needed for muscle spasms. 03/06/16  Yes Unk Pinto, MD  dextromethorphan-guaiFENesin [facility] DM) 30-600 MG 12hr tablet Take 1 tablet by mouth 2 (two) times daily as needed for cough. 09/13/17  Yes Oretha Milch D, MD  ferrous sulfate 325 (65 FE) MG tablet Take 325 mg by mouth daily.    Yes [provider]  gabapentin (NEURONTIN) 600 MG tablet Take 1/2 to 1 tablet 3 times a day for neuropathy pain in legs Patient taking differently: Take 600 mg by mouth 3 (three) times daily. for neuropathy pain in legs 06/24/17  Yes Vicie Mutters, PA-C  ipratropium (ATROVENT) 0.02 % nebulizer solution use 1 vial in nebulizer EVERY 4 HOURS AS NEEDED FOR WHEEZING OR SHORTNESS OF BREATH 75/15=5 08/09/17  Yes Unk Pinto, MD  lactulose (CHRONULAC) 10 GM/15ML solution take 62ms TWICE DAILY Patient taking differently: Take 10 g by mouth daily.  08/08/17  Yes MUnk Pinto MD  Magnesium 250 MG TABS Take 250 mg by mouth at bedtime.    Yes [provider]    metolazone (ZAROXOLYN) 5 MG tablet Take 1 tablet (5 mg total) by mouth once a week. Take once a week with lasix or as directed for fluid overload Patient taking differently: Take 2.5 mg by mouth See admin instructions. Take 1/2 tablet (2.5 mg) by mouth daily as needed for weight gain of 2-3 lbs overnight 09/04/17  Yes MUnk Pinto MD  NONFORMULARY OR COMPOUNDED ITEM Apply 1 application topically See admin instructions. SALICYLIC ACID ACID (PETROLATUM) 50% OINTMENT-APPLY TO AFFECTED AREA AT BEDTIME PRN DRY SKIN/LEGS (Compounded at [place]   Yes [provider]  nystatin cream (MYCOSTATIN) Apply 1 application topically 2 (two) times daily. Patient taking differently: Apply 1 application topically 2 (two) times daily as needed for dry skin (itching).  12/20/16  Yes CVicie Mutters PA-C  ondansetron (ZOFRAN) 4 MG tablet Take 1 tablet (4 mg total) by mouth every 6 (six) hours as needed for nausea or vomiting. 09/02/17  Yes CVicie Mutters PA-C  OXYGEN Inhale 2-3 L into the lungs continuous. 3 L with exertion   Yes [provider]  pantoprazole (PROTONIX) 40 MG tablet Take 1 tablet (40 mg total) by mouth daily. Patient taking differently: Take 40 mg by mouth daily with supper.  04/22/17  Yes MUnk Pinto MD  potassium chloride (K-DUR,KLOR-CON) 10 MEQ tablet Take 3 tablets (30 mEq total) by mouth 3 (three) times daily. Patient taking differently: Take 20-30 mEq by mouth See admin instructions. Take 3 tablets (30 meq) by mouth every morning, take 2 tablets (20 meq) mid afternoon and at bedtime 01/07/17  Yes CVicie Mutters PA-C  rifampin (RIFADIN) 300 MG capsule Take 1 capsule 2 x / day for Liver Patient taking differently: Take 300 mg by mouth 2 (two) times daily with a meal. for Liver 07/03/17 01/31/18 Yes MUnk Pinto MD  terconazole (TERAZOL 7) 0.4 % vaginal cream Place 1 applicator vaginally at bedtime. Patient taking differently: Place 1 applicator vaginally  at bedtime as needed (itching).  12/20/16  Yes CVicie Mutters PA-C  torsemide (DEMADEX) 10 MG tablet Take 3 tablets (30 mg total) by mouth every morning. 09/13/17 10/13/17 Yes  Oretha Milch D, MD  torsemide (DEMADEX) 20 MG tablet Take 3 tablets (60 mg total) by mouth every evening. 09/14/17 10/14/17 Yes Desiree Hane, MD  traMADol (ULTRAM) 50 MG tablet Take 50 mg by mouth every 8 hours as needed for pain Patient taking differently: Take 50 mg by mouth every 8 (eight) hours as needed (pain).  05/07/17  Yes Vicie Mutters, PA-C  triamcinolone cream (KENALOG) 0.1 % Apply 1 application topically 3 (three) times daily as needed for pain Patient taking differently: Apply 1 application topically 3 (three) times daily as needed (itching).  12/20/16  Yes Vicie Mutters, PA-C  amoxicillin-clavulanate (AUGMENTIN) 875-125 MG tablet Take 1 tablet by mouth 2 (two) times daily for 8 doses. Patient not taking: Reported on 09/16/2017 09/13/17 09/17/17  Oretha Milch D, MD  glucose blood test strip Take sugars once daily 01/21/17   Vicie Mutters, [place]    Physical Exam: Vitals:   09/16/17 1630 09/16/17 1700 09/16/17 1701 09/16/17 1704  BP: (!) 129/43 (!) 110/49  (!) 110/49  Pulse: 80 76    Resp: 11 13    Temp:      TempSrc:      SpO2: 96% 93% 95%   Weight:      Height:         General:  Appears calm and comfortable on bipap in no acute distress Eyes:  PERRL, EOMI, normal lids, iris ENT:  grossly normal hearing, lips & tongue, mucous membranes of her mouth are pink and dry  Neck:  no LAD, masses or thyromegaly Cardiovascular:  RRR, no m/r/g. 1+ LE edema.  Respiratory:  Mild increased work of breathing. Sounds quite diminished throughout. I hear some faint end-expiratory wheezing or crackles  Abdomen:  Distended tight mild diffuse tenderness sluggish bowel sounds  Skin:  no rash or induration seen on limited exam Musculoskeletal:  grossly normal tone BUE/BLE, good ROM, no bony abnormality Psychiatric:   grossly normal mood and affect, speech fluent and appropriate, AOx3 Neurologic:  CN 2-12 grossly intact, moves all extremities in coordinated fashion, sensation intact  Labs on Admission: I have personally reviewed following labs and imaging studies  CBC: Recent Labs  Lab 09/13/17 1208 09/16/17 1253  WBC 6.1 6.3  NEUTROABS  --  4.9  HGB 8.7* 9.8*  HCT 28.8* 32.2*  MCV 105.5* 108.8*  PLT 123* 734*   Basic Metabolic Panel: Recent Labs  Lab 09/11/17 0857 09/16/17 1253  NA 138 139  K 4.5 4.0  CL 98* 100*  CO2 27 28  GLUCOSE 161* 126*  BUN 28* 25*  CREATININE 1.29* 1.33*  CALCIUM 8.9 8.7*   GFR: Estimated Creatinine Clearance: 40.9 mL/min (A) (by C-G formula based on SCr of 1.33 mg/dL (H)). Liver Function Tests: Recent Labs  Lab 09/16/17 1253  AST 32  ALT 28  ALKPHOS 120  BILITOT 1.1  PROT 5.3*  ALBUMIN 2.9*   No results for input(s): LIPASE, AMYLASE in the last 168 hours. No results for input(s): AMMONIA in the last 168 hours. Coagulation Profile: No results for input(s): INR, PROTIME in the last 168 hours. Cardiac Enzymes: No results for input(s): CKTOTAL, CKMB, CKMBINDEX, TROPONINI in the last 168 hours. BNP (last 3 results) No results for input(s): PROBNP in the last 8760 hours. HbA1C: No results for input(s): HGBA1C in the last 72 hours. CBG: Recent Labs  Lab 09/12/17 1703 09/12/17 2035 09/13/17 0824 09/13/17 1219 09/13/17 1738  GLUCAP 177* 203* 94 164* 161*   Lipid Profile: No results for  input(s): CHOL, HDL, LDLCALC, TRIG, CHOLHDL, LDLDIRECT in the last 72 hours. Thyroid Function Tests: No results for input(s): TSH, T4TOTAL, FREET4, T3FREE, THYROIDAB in the last 72 hours. Anemia Panel: No results for input(s): VITAMINB12, FOLATE, FERRITIN, TIBC, IRON, RETICCTPCT in the last 72 hours. Urine analysis:    Component Value Date/Time   COLORURINE YELLOW 09/07/2017 2036   APPEARANCEUR CLEAR 09/07/2017 2036   LABSPEC 1.015 09/07/2017 2036    PHURINE 6.0 09/07/2017 2036   GLUCOSEU NEGATIVE 09/07/2017 2036   HGBUR SMALL (A) 09/07/2017 2036   BILIRUBINUR NEGATIVE 09/07/2017 2036   KETONESUR NEGATIVE 09/07/2017 2036   PROTEINUR NEGATIVE 09/07/2017 2036   UROBILINOGEN 1 01/13/2015 1145   NITRITE NEGATIVE 09/07/2017 2036   LEUKOCYTESUR NEGATIVE 09/07/2017 2036    Creatinine Clearance: Estimated Creatinine Clearance: 40.9 mL/min (A) (by C-G formula based on SCr of 1.33 mg/dL (H)).  Sepsis Labs: @LABRCNTIP (procalcitonin:4,lacticidven:4) ) Recent Results (from the past 240 hour(s))  Gram stain     Status: None   Collection Time: 09/12/17 11:54 AM  Result Value Ref Range Status   Specimen Description PLEURAL RIGHT  Final   Special Requests NONE  Final   Gram Stain   Final    RARE WBC PRESENT,BOTH PMN AND MONONUCLEAR NO ORGANISMS SEEN Performed at [facility], [address] [place], [place] [address]    Report Status 09/13/2017 FINAL  Final  Culture, body fluid-bottle     Status: None (Preliminary result)   Collection Time: 09/12/17 11:54 AM  Result Value Ref Range Status   Specimen Description PLEURAL RIGHT  Final   Special Requests NONE  Final   Culture   Final    NO GROWTH 4 DAYS Performed at [facility] [address], [place], [place] [address]    Report Status PENDING  Incomplete     Radiological Exams on Admission: Dg Chest Port 1 View  Result Date: 09/16/2017 CLINICAL DATA:  Patient status post thoracentesis 09/12/2017 for a right pleural effusion. Severe shortness of breath today. EXAM: PORTABLE CHEST 1 VIEW COMPARISON:  Single-view of the chest 09/12/2017. FINDINGS: The patient has a moderately large right pleural effusion which has markedly increased in size since the comparison examination. Associated basilar airspace disease is noted. The left lung is clear. There is cardiomegaly. IMPRESSION: Moderately large right pleural effusion is markedly increased since the post thoracentesis exam.  Electronically Signed   By: Inge Rise M.D.   On: 09/16/2017 13:18    EKG:  Sinus rhythm Right bundle branch block No significant change since last tracing  Assessment/Plan Principal Problem:   Acute on chronic respiratory failure with hypoxemia (HCC) Active Problems:   Anemia   Hepatic cirrhosis (HCC)   Essential hypertension   Chronic respiratory failure (HCC)   Obesity (BMI 30-39.9)   COPD mixed type (HCC)   Anasarca   Hepatocellular carcinoma (HCC)   Ascites   Pleural effusion on right   #1. Acute on chronic respiratory failure multifactorial etiology i.e. recurrent/worsening effusion in the setting of COPD exacerbation. She presents on BiPAP per ems. Chest x-ray reveals moderately large right pleural effusion is markedly increased since the post thoracentesis exam. May need pleurex drain -Admit to step down -Scheduled nebs -Solu-Medrol -Thoracentesis per interventional radiology -Wean BiPAP as able -On its her oxygen saturation level -Paracentesis per interventional radiology  2. Pleural effusion on right recurrent/worsening. Chest  x-ray as noted above. Chart review indicates 4 days ago thoracentisis done fluid analysis consistent with transudate of effusion  increased secondary to CHF and hepatic hydrothorax. Culture with no growth as yet. 1.7L removed She reports compliance with her home medications. Provided with 60 mg a Lasix in the emergency department. -Continue IV Lasix -Request IR thoracentesis -monitor  #3. abdominal swelling/liver cirrhosis and ascites/hx HCC. Husband reports unable to perform Paracentesis last hospitalization. Chart review indicates recent abdominal ultrasound showed trace intra-abdominal ascites insufficient for safe ultrasound-guided paracentesis. Patient is alert and oriented. No indications of SBP at time of admission.   May need drain -Request interventional radiology for paracentisis -obtain [place] level -continue lactulose -holding  torsemide -holding zaroxolyn -monitor  #4. COPD exacerbation. Mild. Faint wheezing. -Scheduled nebs -Low-dose Medrol -Wean BiPAP as able -see #1  #5. Hypertension. Controlled in the emergency department. Home medications include bisoprolol  -monitor   DVT prophylaxis: scd  Code Status: dnr  Family Communication: husband at bedside  Disposition Plan: home  Consults called: IR  Admission status: inpatient    Radene Gunning MD [facility]  If 7PM-7AM, please contact night-coverage [email or web address] Password [facility]  09/16/2017, 5:52 PM

## 2017-09-16 NOTE — ED Notes (Signed)
No urine output from pt yet.

## 2017-09-16 NOTE — Progress Notes (Signed)
Attempted to receive report x3

## 2017-09-16 NOTE — ED Notes (Signed)
Care handoff given to [facility], RN bedside on 2C.

## 2017-09-16 NOTE — Progress Notes (Deleted)
Hospital follow up  Assessment and Plan: Hospital visit follow up for ***:  Hospital discharge meds were reviewed, and reconciled with the patient.    There are no discontinued medications. CAN NOT DO FOR BCBS REGULAR OR MEDICARE  Over 40 minutes of exam, counseling, chart review, and complex, high/moderate level critical decision making was performed this visit.     HPI 72 y.o.female presents for follow up for transition from recent hospitalization or SNIF stay. Admit date to the hospital was 09/07/17, patient was discharged from the hospital on 09/13/17 and our clinical staff contacted the office the day after discharge to set up a follow up appointment. The discharge summary, medications, and diagnostic test results were reviewed before meeting with the patient. The patient was admitted for:    Home health {ACTION; IS/IS [id number]} involved.   Images while in the hospital: Dg Chest Port 1 View  Result Date: 09/16/2017 CLINICAL DATA:  Patient status post thoracentesis 09/12/2017 for a right pleural effusion. Severe shortness of breath today. EXAM: PORTABLE CHEST 1 VIEW COMPARISON:  Single-view of the chest 09/12/2017. FINDINGS: The patient has a moderately large right pleural effusion which has markedly increased in size since the comparison examination. Associated basilar airspace disease is noted. The left lung is clear. There is cardiomegaly. IMPRESSION: Moderately large right pleural effusion is markedly increased since the post thoracentesis exam. Electronically Signed   By: Inge Rise M.D.   On: 09/16/2017 13:18    Past Medical History:  Diagnosis Date  . Asthma   . Cancer [facility])    liver cancer   . COPD (chronic obstructive pulmonary disease) ([place])    x 3 years   . Depression   . Diabetes mellitus without complication ([place])    diet controlled  not on meds   . Edema    left leg   . Esophageal varices ([place])   . Fibromyalgia   . Gastritis   . GERD (gastroesophageal  reflux disease)   . Hepatic cirrhosis ([place])   . Hepatic encephalopathy ([place])   . History of blood transfusion   . Hyperlipidemia   . Hypertension   . IBS (irritable bowel syndrome)   . Phlebitis    left leg x 2   . Pneumonia    hx of   . Splenomegaly   . Vitamin D deficiency      Allergies  Allergen Reactions  . Atorvastatin Other (See Comments)    Unknown reaction  . Diphenhydramine Hcl (Sleep) Hives  . Hydrocodone-Acetaminophen Other (See Comments)    Unknown reaction  . Lopid [Gemfibrozil] Other (See Comments)    Unknown reaction  . Loratadine Hives  . Lorazepam Hives  . Simvastatin Other (See Comments)    Unknown reaction  . Sulfamethoxazole Hives  . Sulfonamide Derivatives Hives      Current Outpatient Medications on File Prior to Visit  Medication Sig Dispense Refill  . albuterol (PROVENTIL HFA;VENTOLIN HFA) 108 (90 Base) MCG/ACT inhaler Inhale 2 puffs into the lungs every 6 (six) hours as needed for wheezing or shortness of breath. 1 Inhaler 2  . allopurinol (ZYLOPRIM) 300 MG tablet take 1/2-1 tablet by mouth daily for gout (Patient taking differently: take 1 tablet (300 mg) by mouth daily with supper - for gout) 90 tablet 1  . amoxicillin-clavulanate (AUGMENTIN) 875-125 MG tablet Take 1 tablet by mouth 2 (two) times daily for 8 doses. 8 tablet 0  . aspirin EC 81 MG tablet Take 81 mg by mouth at bedtime.    Marland Kitchen  bisoprolol (ZEBETA) 10 MG tablet Take 1 tablet (10 mg total) by mouth daily. (Patient taking differently: Take 10 mg by mouth daily with supper. ) 90 tablet 0  . budesonide (PULMICORT) 0.25 MG/2ML nebulizer solution Take 2 mLs (0.25 mg total) by nebulization 2 (two) times daily. 60 mL 12  . Cholecalciferol (VITAMIN D3) 5000 units CAPS Take 5,000 Units by mouth daily with breakfast.    . citalopram (CELEXA) 40 MG tablet Take 1 tablet (40 mg total) by mouth 2 (two) times daily. (Patient taking differently: Take 40 mg by mouth daily with supper. ) 60 tablet 3  .  cyclobenzaprine (FLEXERIL) 10 MG tablet Take 1 tablet (10 mg total) by mouth 3 (three) times daily as needed for muscle spasms. 90 tablet 2  . dextromethorphan-guaiFENesin (MUCINEX DM) 30-600 MG 12hr tablet Take 1 tablet by mouth 2 (two) times daily as needed for cough. 30 tablet 0  . ferrous sulfate 325 (65 FE) MG tablet Take 325 mg by mouth daily.     Marland Kitchen gabapentin (NEURONTIN) 600 MG tablet Take 1/2 to 1 tablet 3 times a day for neuropathy pain in legs (Patient taking differently: Take 600 mg by mouth 3 (three) times daily. for neuropathy pain in legs) 270 tablet 1  . glucose blood test strip Take sugars once daily 50 each 12  . ipratropium (ATROVENT) 0.02 % nebulizer solution use 1 vial in nebulizer EVERY 4 HOURS AS NEEDED FOR WHEEZING OR SHORTNESS OF BREATH 75/15=5 75 mL 3  . lactulose (CHRONULAC) 10 GM/15ML solution take 8ms TWICE DAILY (Patient taking differently: Take 10 g by mouth daily. ) 2700 mL 2  . Magnesium 250 MG TABS Take 250 mg by mouth at bedtime.     . metolazone (ZAROXOLYN) 5 MG tablet Take 1 tablet (5 mg total) by mouth once a week. Take once a week with lasix or as directed for fluid overload (Patient taking differently: Take 2.5 mg by mouth See admin instructions. Take 1/2 tablet (2.5 mg) by mouth daily as needed for weight gain of 2-3 lbs overnight) 30 tablet 0  . NONFORMULARY OR COMPOUNDED ITEM Apply 1 application topically See admin instructions. SALICYLIC ACID ACID (PETROLATUM) 50% OINTMENT-APPLY TO AFFECTED AREA AT BEDTIME PRN DRY SKIN/LEGS ([place] [facility]    . nystatin cream (MYCOSTATIN) Apply 1 application topically 2 (two) times daily. (Patient taking differently: Apply 1 application topically 2 (two) times daily as needed for dry skin (itching). ) 30 g 1  . ondansetron (ZOFRAN) 4 MG tablet Take 1 tablet (4 mg total) by mouth every 6 (six) hours as needed for nausea or vomiting. 30 tablet 1  . OXYGEN Inhale 2-3 L into the lungs continuous. 3 L with  exertion    . pantoprazole (PROTONIX) 40 MG tablet Take 1 tablet (40 mg total) by mouth daily. (Patient taking differently: Take 40 mg by mouth daily with supper. ) 90 tablet 3  . potassium chloride (K-DUR,KLOR-CON) 10 MEQ tablet Take 3 tablets (30 mEq total) by mouth 3 (three) times daily. (Patient taking differently: Take 20-30 mEq by mouth See admin instructions. Take 3 tablets (30 meq) by mouth every morning, take 2 tablets (20 meq) mid afternoon and at bedtime) 270 tablet 11  . rifampin (RIFADIN) 300 MG capsule Take 1 capsule 2 x / day for Liver (Patient taking differently: Take 300 mg by mouth 2 (two) times daily with a meal. for Liver) 180 capsule 1  . terconazole (TERAZOL 7) 0.4 % vaginal cream  Place 1 applicator vaginally at bedtime. (Patient taking differently: Place 1 applicator vaginally at bedtime as needed (itching). ) 45 g 2  . torsemide (DEMADEX) 10 MG tablet Take 3 tablets (30 mg total) by mouth every morning. 90 tablet 0  . torsemide (DEMADEX) 20 MG tablet Take 3 tablets (60 mg total) by mouth every evening. 90 tablet 0  . traMADol (ULTRAM) 50 MG tablet Take 50 mg by mouth every 8 hours as needed for pain (Patient taking differently: Take 50 mg by mouth every 8 (eight) hours as needed (pain). ) 90 tablet 1  . triamcinolone cream (KENALOG) 0.1 % Apply 1 application topically 3 (three) times daily as needed for pain (Patient taking differently: Apply 1 application topically 3 (three) times daily as needed (itching). ) 80 g 0   No current facility-administered medications on file prior to visit.     ROS: all negative except above.   Physical Exam: There were no vitals filed for this visit. There were no vitals taken for this visit. General Appearance: Well nourished, in no apparent distress. Eyes: PERRLA, EOMs, conjunctiva no swelling or erythema Sinuses: No Frontal/maxillary tenderness ENT/Mouth: Ext aud canals clear, TMs without erythema, bulging. No erythema, swelling, or  exudate on post pharynx.  Tonsils not swollen or erythematous. Hearing normal.  Neck: Supple, thyroid normal.  Respiratory: Respiratory effort normal, BS equal bilaterally without rales, rhonchi, wheezing or stridor.  Cardio: RRR with no MRGs. Brisk peripheral pulses without edema.  Abdomen: Soft, + BS.  Non tender, no guarding, rebound, hernias, masses. Lymphatics: Non tender without lymphadenopathy.  Musculoskeletal: Full ROM, 5/5 strength, normal gait.  Skin: Warm, dry without rashes, lesions, ecchymosis.  Neuro: Cranial nerves intact. Normal muscle tone, no cerebellar symptoms. Sensation intact.  Psych: Awake and oriented X 3, normal affect, Insight and Judgment appropriate.     Izora Ribas, NP 2:34 PM [facility]

## 2017-09-16 NOTE — ED Triage Notes (Signed)
Pt arrives to ED from home with complaints of shortness of breath since today. EMS reports pt was d/c after same complaint on 2/8. Pt had fluid drained off lungs but not abdomen; abdomen is distended upon arrival, on CPAP. Pt was 85% on 2L upon EMS arrival, placed on CPAP and stated releif. Pt placed in position of comfort with bed locked and lowered, call bell in reach.

## 2017-09-16 NOTE — Progress Notes (Signed)
Patient arrived via EMS on CPAP.  Patient taken off of CPAP and placed on our bipap.  Patient is currently tolerating well.  Will continue to monitor.

## 2017-09-17 ENCOUNTER — Inpatient Hospital Stay (HOSPITAL_COMMUNITY): Payer: PPO

## 2017-09-17 ENCOUNTER — Encounter (HOSPITAL_COMMUNITY): Payer: Self-pay | Admitting: Student

## 2017-09-17 ENCOUNTER — Ambulatory Visit: Payer: Self-pay | Admitting: Adult Health

## 2017-09-17 HISTORY — PX: IR THORACENTESIS ASP PLEURAL SPACE W/IMG GUIDE: IMG5380

## 2017-09-17 LAB — LACTATE DEHYDROGENASE, PLEURAL OR PERITONEAL FLUID: LD, Fluid: 37 U/L — ABNORMAL HIGH (ref 3–23)

## 2017-09-17 LAB — COMPREHENSIVE METABOLIC PANEL
ALBUMIN: 2.5 g/dL — AB (ref 3.5–5.0)
ALK PHOS: 100 U/L (ref 38–126)
ALT: 26 U/L (ref 14–54)
AST: 26 U/L (ref 15–41)
Anion gap: 11 (ref 5–15)
BILIRUBIN TOTAL: 1.1 mg/dL (ref 0.3–1.2)
BUN: 25 mg/dL — AB (ref 6–20)
CO2: 30 mmol/L (ref 22–32)
CREATININE: 1.23 mg/dL — AB (ref 0.44–1.00)
Calcium: 8.5 mg/dL — ABNORMAL LOW (ref 8.9–10.3)
Chloride: 99 mmol/L — ABNORMAL LOW (ref 101–111)
GFR calc Af Amer: 50 mL/min — ABNORMAL LOW (ref >60)
GFR calc non Af Amer: 43 mL/min — ABNORMAL LOW (ref >60)
GLUCOSE: 153 mg/dL — AB (ref 65–99)
Potassium: 4 mmol/L (ref 3.5–5.1)
Sodium: 140 mmol/L (ref 135–145)
TOTAL PROTEIN: 4.6 g/dL — AB (ref 6.5–8.1)

## 2017-09-17 LAB — CBC
HCT: 27.8 % — ABNORMAL LOW (ref 36.0–46.0)
Hemoglobin: 8.3 g/dL — ABNORMAL LOW (ref 12.0–15.0)
MCH: 31.9 pg (ref 26.0–34.0)
MCHC: 29.9 g/dL — AB (ref 30.0–36.0)
MCV: 106.9 fL — ABNORMAL HIGH (ref 78.0–100.0)
Platelets: 112 10*3/uL — ABNORMAL LOW (ref 150–400)
RBC: 2.6 MIL/uL — ABNORMAL LOW (ref 3.87–5.11)
RDW: 16.1 % — AB (ref 11.5–15.5)
WBC: 3.4 10*3/uL — ABNORMAL LOW (ref 4.0–10.5)

## 2017-09-17 LAB — BODY FLUID CELL COUNT WITH DIFFERENTIAL
EOS FL: 0 %
Lymphs, Fluid: 24 %
Monocyte-Macrophage-Serous Fluid: 64 % (ref 50–90)
NEUTROPHIL FLUID: 12 % (ref 0–25)
Other Cells, Fluid: 0 %
WBC FLUID: 39 uL (ref 0–1000)

## 2017-09-17 LAB — PROTIME-INR
INR: 1.23
PROTHROMBIN TIME: 15.4 s — AB (ref 11.4–15.2)

## 2017-09-17 LAB — MRSA PCR SCREENING: MRSA by PCR: NEGATIVE

## 2017-09-17 LAB — CULTURE, BODY FLUID W GRAM STAIN -BOTTLE

## 2017-09-17 LAB — GLUCOSE, PLEURAL OR PERITONEAL FLUID: Glucose, Fluid: 123 mg/dL

## 2017-09-17 LAB — PROTEIN, PLEURAL OR PERITONEAL FLUID: Total protein, fluid: <3 g/dL

## 2017-09-17 LAB — LACTATE DEHYDROGENASE: LDH: 189 U/L (ref 98–192)

## 2017-09-17 LAB — CULTURE, BODY FLUID-BOTTLE: Culture: NO GROWTH

## 2017-09-17 MED ORDER — IPRATROPIUM-ALBUTEROL 0.5-2.5 (3) MG/3ML IN SOLN
3.0000 mL | Freq: Four times a day (QID) | RESPIRATORY_TRACT | Status: DC
  Administered 2017-09-18 – 2017-09-23 (×21): 3 mL via RESPIRATORY_TRACT
  Filled 2017-09-17 (×22): qty 3

## 2017-09-17 MED ORDER — CITALOPRAM HYDROBROMIDE 20 MG PO TABS
40.0000 mg | ORAL_TABLET | Freq: Every day | ORAL | Status: DC
  Administered 2017-09-17 – 2017-09-19 (×3): 40 mg via ORAL
  Filled 2017-09-17 (×3): qty 2

## 2017-09-17 MED ORDER — BUDESONIDE 0.25 MG/2ML IN SUSP
0.2500 mg | Freq: Two times a day (BID) | RESPIRATORY_TRACT | Status: DC
  Administered 2017-09-17 – 2017-09-28 (×21): 0.25 mg via RESPIRATORY_TRACT
  Filled 2017-09-17 (×23): qty 2

## 2017-09-17 MED ORDER — IPRATROPIUM BROMIDE 0.02 % IN SOLN
0.5000 mg | Freq: Four times a day (QID) | RESPIRATORY_TRACT | Status: DC
  Administered 2017-09-17 (×2): 0.5 mg via RESPIRATORY_TRACT
  Filled 2017-09-17 (×2): qty 2.5

## 2017-09-17 MED ORDER — LIDOCAINE HCL 1 % IJ SOLN
INTRAMUSCULAR | Status: DC | PRN
  Administered 2017-09-17: 10 mL

## 2017-09-17 MED ORDER — TRAMADOL HCL 50 MG PO TABS
50.0000 mg | ORAL_TABLET | Freq: Two times a day (BID) | ORAL | Status: DC | PRN
  Administered 2017-09-18 – 2017-09-27 (×3): 50 mg via ORAL
  Filled 2017-09-17 (×5): qty 1

## 2017-09-17 MED ORDER — GUAIFENESIN-DM 100-10 MG/5ML PO SYRP
5.0000 mL | ORAL_SOLUTION | ORAL | Status: DC | PRN
  Administered 2017-09-17 – 2017-09-28 (×17): 5 mL via ORAL
  Filled 2017-09-17 (×17): qty 5

## 2017-09-17 MED ORDER — ALBUTEROL SULFATE (2.5 MG/3ML) 0.083% IN NEBU
2.5000 mg | INHALATION_SOLUTION | RESPIRATORY_TRACT | Status: DC | PRN
  Administered 2017-09-18 – 2017-09-19 (×3): 2.5 mg via RESPIRATORY_TRACT
  Filled 2017-09-17 (×3): qty 3

## 2017-09-17 MED ORDER — BISOPROLOL FUMARATE 5 MG PO TABS
10.0000 mg | ORAL_TABLET | Freq: Every day | ORAL | Status: DC
  Administered 2017-09-17 – 2017-09-27 (×11): 10 mg via ORAL
  Filled 2017-09-17 (×11): qty 2

## 2017-09-17 MED ORDER — ACETAMINOPHEN 500 MG PO TABS
500.0000 mg | ORAL_TABLET | Freq: Four times a day (QID) | ORAL | Status: DC | PRN
  Administered 2017-09-28: 500 mg via ORAL
  Filled 2017-09-17 (×2): qty 1

## 2017-09-17 MED ORDER — CHLORHEXIDINE GLUCONATE 0.12 % MT SOLN
15.0000 mL | Freq: Two times a day (BID) | OROMUCOSAL | Status: DC
  Administered 2017-09-17 – 2017-09-28 (×22): 15 mL via OROMUCOSAL
  Filled 2017-09-17 (×21): qty 15

## 2017-09-17 MED ORDER — ORAL CARE MOUTH RINSE
15.0000 mL | Freq: Two times a day (BID) | OROMUCOSAL | Status: DC
  Administered 2017-09-18 – 2017-09-26 (×9): 15 mL via OROMUCOSAL

## 2017-09-17 MED ORDER — CYCLOBENZAPRINE HCL 10 MG PO TABS
10.0000 mg | ORAL_TABLET | Freq: Three times a day (TID) | ORAL | Status: DC | PRN
  Administered 2017-09-18 – 2017-09-27 (×4): 10 mg via ORAL
  Filled 2017-09-17 (×4): qty 1

## 2017-09-17 MED ORDER — LIDOCAINE 2% (20 MG/ML) 5 ML SYRINGE
INTRAMUSCULAR | Status: AC
  Filled 2017-09-17: qty 10

## 2017-09-17 MED ORDER — OXYCODONE HCL 5 MG PO TABS
5.0000 mg | ORAL_TABLET | Freq: Four times a day (QID) | ORAL | Status: DC | PRN
  Administered 2017-09-18 – 2017-09-26 (×7): 5 mg via ORAL
  Filled 2017-09-17 (×8): qty 1

## 2017-09-17 NOTE — Progress Notes (Signed)
RT removed patient from bipap and placed on 4L [place]. Vitals are stable and sats are 98%. Patient is comfortable. RT will continue to monitor.

## 2017-09-17 NOTE — Progress Notes (Signed)
[facility]  Patient Status: Active (receiving services up to time of hospitalization)  [facility] is providing the following services: RN and PT  Referred for services but readmitted prior to start of services.  If patient discharges after hours, please call [phone].   Janae Sauce 09/17/2017, 4:29 PM

## 2017-09-17 NOTE — Progress Notes (Signed)
[name] TEAM 1 - Stepdown/ICU TEAM  [name]  SNK:[id number] DOB: 05/22/1946 DOA: 09/16/2017 PCP: Unk Pinto, MD    Brief Narrative:  71 y.o. female with a hx of non-alcohol related liver cirrhosis, COPD, and hepatocellular carcinoma diagnosed Nov 2018 s/p CT guided ablation with hospitalization 2/2 > 09/13/17 due to anasarca and acute on chronic respiratory failure s/p thoracentesis who returned to the ED 2/11 with recurrent respiratory failure.  CXR showed re-accumulation of a right-sided pleural effusion..    Significant Events: 2/8 d/c  2/11 re-admit w/ rapidly re-accumulated pleural effusion   Subjective: The patient is anxious to have her thoracentesis.  She remains short of breath but not in extremis.  She denies chest pain nausea vomiting or abdominal pain.  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 S/p thoracentesis 09/12/17 (1.5L) - rapidly re-accumulated - for repeat thora today - evaluate for further tx options based upon results of repeat thora - typically hepatic hydrothorax is refractory to usual modalities of recurrent effusion tx - may have to consider TIPS   Nonalcoholic cirrhosis - hepatocellular carcinoma  Grade 2 Chronic diastolic CHF  Does not appear signif overloaded in regard to total body volume - wgt was 84.5 at time of d/c 2/8 - cont usual diuretics and follow post [name] Weights   09/16/17 1251 09/16/17 2300 09/17/17 0536  Weight: 86.2 kg (190 lb) 89.8 kg (197 lb 15.6 oz) 89.5 kg (197 lb 5 oz)    COPD Some wheezing on exam - hope this will improve w/ correction of R sided atx - cont nebs / steroids - wean as able   Macrocytic anemia Check P37 and folic acid   CKD crt 1.3 09/11/17 - follow crt  w/ ongoing diuretic use   OHS - SA  Obesity - Body mass index is 36.09 kg/m.   DVT prophylaxis: SCDs Code Status: DNR - NO CODE Family Communication: spoke w/ husband at bedside Disposition Plan: SDU  Consultants:  IR  Antimicrobials:  none  Objective: Blood pressure (!) 127/49, pulse 80, temperature (!) 97 F (36.1 C), temperature source Axillary, resp. rate 12, height 5' 2"  (1.575 m), weight 89.5 kg (197 lb 5 oz), SpO2 99 %.  Intake/Output Summary (Last 24 hours) at 09/17/2017 1117 Last data filed at 09/17/2017 0030 Gross per 24 hour  Intake 20 ml  Output 700 ml  Net -680 ml   Filed Weights   09/16/17 1251 09/16/17 2300 09/17/17 0536  Weight: 86.2 kg (190 lb) 89.8 kg (197 lb 15.6 oz) 89.5 kg (197 lb 5 oz)    Examination: General: mild respiratory distress w/ tachypnea but sats favorable and able to complete full sentences Lungs: no air movement th/o majority of R fields - wheezing diffusely across L fields  Cardiovascular: RRR - no M or rub  Abdomen: Nontender, obese, soft, bowel sounds positive, no rebound, no ascites, no appreciable mass Extremities: 1+ edema bilateral lower extremities  CBC: Recent Labs  Lab 09/13/17 1208 09/16/17 1253 09/17/17 0407  WBC 6.1 6.3 3.4*  NEUTROABS  --  4.9  --   HGB 8.7* 9.8* 8.3*  HCT 28.8* 32.2* 27.8*  MCV 105.5* 108.8* 106.9*  PLT 123* 140* 902*   Basic Metabolic  Panel: Recent Labs  Lab 09/11/17 0857 09/16/17 1253 09/17/17 0407  NA 138 139 140  K 4.5 4.0 4.0  CL 98* 100* 99*  CO2 27 28 30   GLUCOSE 161* 126* 153*  BUN 28* 25* 25*  CREATININE 1.29* 1.33* 1.23*  CALCIUM 8.9 8.7* 8.5*   GFR: Estimated Creatinine Clearance: 44.3 mL/min (A) (by C-G formula based on SCr of 1.23 mg/dL (H)).  Liver Function Tests: Recent Labs  Lab 09/16/17 1253 09/17/17 0407  AST 32 26  ALT 28 26  ALKPHOS 120 100  BILITOT 1.1 1.1  PROT 5.3* 4.6*  ALBUMIN 2.9* 2.5*    Coagulation Profile: Recent Labs  Lab 09/17/17 0708   INR 1.23   HbA1C: Hgb A1c MFr Bld  Date/Time Value Ref Range Status  09/03/2017 03:47 PM 5.1 <5.7 % of total Hgb Final    Comment:    For the purpose of screening for the presence of diabetes: . <5.7%       Consistent with the absence of diabetes 5.7-6.4%    Consistent with increased risk for diabetes             (prediabetes) > or =6.5%  Consistent with diabetes . This assay result is consistent with a decreased risk of diabetes. . Currently, no consensus exists regarding use of hemoglobin A1c for diagnosis of diabetes in children. . According to American Diabetes Association (ADA) guidelines, hemoglobin A1c <7.0% represents optimal control in non-pregnant diabetic patients. Different metrics may apply to specific patient populations.  Standards of Medical Care in Diabetes(ADA). Marland Kitchen   08/27/2017 02:57 PM 5.1 4.8 - 5.6 % Final    Comment:    (NOTE) Pre diabetes:          5.7%-6.4% Diabetes:              >6.4% Glycemic control for   <7.0% adults with diabetes     CBG: Recent Labs  Lab 09/12/17 1703 09/12/17 2035 09/13/17 0824 09/13/17 1219 09/13/17 1738  GLUCAP 177* 203* 94 164* 161*    Recent Results (from the past 240 hour(s))  Gram stain     Status: None   Collection Time: 09/12/17 11:54 AM  Result Value Ref Range Status   Specimen Description PLEURAL RIGHT  Final   Special Requests NONE  Final   Gram Stain   Final    RARE WBC PRESENT,BOTH PMN AND MONONUCLEAR NO ORGANISMS SEEN Performed at [facility], [place] [address], [place], [place] [address]    Report Status 09/13/2017 FINAL  Final  Culture, body fluid-bottle     Status: None   Collection Time: 09/12/17 11:54 AM  Result Value Ref Range Status   Specimen Description PLEURAL RIGHT  Final   Special Requests NONE  Final   Culture   Final    NO GROWTH 5 DAYS Performed at [place] [address], [place], [place] [address]    Report Status 09/17/2017 FINAL  Final  MRSA PCR  Screening     Status: None   Collection Time: 09/16/17 11:37 PM  Result Value Ref Range Status   MRSA by PCR NEGATIVE NEGATIVE Final    Comment:        The GeneXpert MRSA Assay (FDA approved for NASAL specimens only), is one component of a comprehensive MRSA colonization surveillance program. It is not intended to diagnose MRSA infection nor to guide or monitor treatment for MRSA infections. Performed at [facility], [place] [address]  [place], [place] [address]      Scheduled Meds: . albuterol  2.5 mg Nebulization Q6H  . budesonide  0.25 mg Nebulization BID  . chlorhexidine  15 mL Mouth Rinse BID  . furosemide  40 mg Intravenous BID  . gabapentin  600 mg Oral TID  . lactulose  10 g Oral Daily  . mouth rinse  15 mL Mouth Rinse q12n4p  . methylPREDNISolone (SOLU-MEDROL) injection  40 mg Intravenous Q12H  . potassium chloride  30 mEq Oral Daily  . potassium chloride  20 mEq Oral 2 times per day  . rifampin  300 mg Oral BID WC     LOS: 1 day   Cherene Altes, MD [facility] Office  [phone] Pager - Text Page per Amion as per below:  On-Call/Text Page:      [email or web address]      password [email or web address]  If 7PM-7AM, please contact night-coverage [email or web address] Password [email or web address] 09/17/2017, 11:17 AM

## 2017-09-17 NOTE — Procedures (Signed)
PROCEDURE SUMMARY:  Successful US guided diagnostic and therapeutic right thoracentesis. Yielded 900 mL of cloudy, yellow fluid. Pt tolerated procedure well. No immediate complications.  Specimen was sent for labs. CXR ordered.  Docia Barrier PA-C 09/17/2017 1:53 PM

## 2017-09-18 ENCOUNTER — Ambulatory Visit: Payer: Self-pay

## 2017-09-18 ENCOUNTER — Inpatient Hospital Stay (HOSPITAL_COMMUNITY): Payer: PPO

## 2017-09-18 DIAGNOSIS — K7460 Unspecified cirrhosis of liver: Secondary | ICD-10-CM

## 2017-09-18 DIAGNOSIS — I10 Essential (primary) hypertension: Secondary | ICD-10-CM

## 2017-09-18 DIAGNOSIS — J90 Pleural effusion, not elsewhere classified: Secondary | ICD-10-CM

## 2017-09-18 DIAGNOSIS — R188 Other ascites: Secondary | ICD-10-CM

## 2017-09-18 DIAGNOSIS — D539 Nutritional anemia, unspecified: Secondary | ICD-10-CM

## 2017-09-18 DIAGNOSIS — Z9889 Other specified postprocedural states: Secondary | ICD-10-CM

## 2017-09-18 DIAGNOSIS — R601 Generalized edema: Secondary | ICD-10-CM

## 2017-09-18 DIAGNOSIS — J449 Chronic obstructive pulmonary disease, unspecified: Secondary | ICD-10-CM

## 2017-09-18 DIAGNOSIS — K7469 Other cirrhosis of liver: Secondary | ICD-10-CM

## 2017-09-18 DIAGNOSIS — J9611 Chronic respiratory failure with hypoxia: Secondary | ICD-10-CM

## 2017-09-18 LAB — COMPREHENSIVE METABOLIC PANEL
ALT: 26 U/L (ref 14–54)
AST: 33 U/L (ref 15–41)
Albumin: 2.7 g/dL — ABNORMAL LOW (ref 3.5–5.0)
Alkaline Phosphatase: 116 U/L (ref 38–126)
Anion gap: 12 (ref 5–15)
BILIRUBIN TOTAL: 1.2 mg/dL (ref 0.3–1.2)
BUN: 27 mg/dL — AB (ref 6–20)
CHLORIDE: 99 mmol/L — AB (ref 101–111)
CO2: 28 mmol/L (ref 22–32)
CREATININE: 1.31 mg/dL — AB (ref 0.44–1.00)
Calcium: 8.6 mg/dL — ABNORMAL LOW (ref 8.9–10.3)
GFR calc Af Amer: 47 mL/min — ABNORMAL LOW (ref >60)
GFR, EST NON AFRICAN AMERICAN: 40 mL/min — AB (ref >60)
Glucose, Bld: 161 mg/dL — ABNORMAL HIGH (ref 65–99)
Potassium: 3.9 mmol/L (ref 3.5–5.1)
Sodium: 139 mmol/L (ref 135–145)
TOTAL PROTEIN: 4.8 g/dL — AB (ref 6.5–8.1)

## 2017-09-18 LAB — RESPIRATORY PANEL BY PCR
Adenovirus: NOT DETECTED
BORDETELLA PERTUSSIS-RVPCR: NOT DETECTED
CHLAMYDOPHILA PNEUMONIAE-RVPPCR: NOT DETECTED
CORONAVIRUS 229E-RVPPCR: NOT DETECTED
CORONAVIRUS HKU1-RVPPCR: NOT DETECTED
Coronavirus NL63: NOT DETECTED
Coronavirus OC43: NOT DETECTED
INFLUENZA B-RVPPCR: NOT DETECTED
Influenza A: NOT DETECTED
MYCOPLASMA PNEUMONIAE-RVPPCR: NOT DETECTED
Metapneumovirus: NOT DETECTED
Parainfluenza Virus 1: NOT DETECTED
Parainfluenza Virus 2: NOT DETECTED
Parainfluenza Virus 3: NOT DETECTED
Parainfluenza Virus 4: NOT DETECTED
Respiratory Syncytial Virus: DETECTED — AB
Rhinovirus / Enterovirus: NOT DETECTED

## 2017-09-18 LAB — CBC
HCT: 31.9 % — ABNORMAL LOW (ref 36.0–46.0)
Hemoglobin: 9.6 g/dL — ABNORMAL LOW (ref 12.0–15.0)
MCH: 32 pg (ref 26.0–34.0)
MCHC: 30.1 g/dL (ref 30.0–36.0)
MCV: 106.3 fL — AB (ref 78.0–100.0)
PLATELETS: 136 10*3/uL — AB (ref 150–400)
RBC: 3 MIL/uL — ABNORMAL LOW (ref 3.87–5.11)
RDW: 16.4 % — AB (ref 11.5–15.5)
WBC: 5.5 10*3/uL (ref 4.0–10.5)

## 2017-09-18 LAB — BLOOD GAS, ARTERIAL
ACID-BASE EXCESS: 7.8 mmol/L — AB (ref 0.0–2.0)
Bicarbonate: 32.8 mmol/L — ABNORMAL HIGH (ref 20.0–28.0)
DRAWN BY: 33100
O2 Content: 3 L/min
O2 Saturation: 89.1 %
PCO2 ART: 56.3 mmHg — AB (ref 32.0–48.0)
PH ART: 7.384 (ref 7.350–7.450)
Patient temperature: 98.6
pO2, Arterial: 60.7 mmHg — ABNORMAL LOW (ref 83.0–108.0)

## 2017-09-18 LAB — AMMONIA: AMMONIA: 21 umol/L (ref 9–35)

## 2017-09-18 LAB — FOLATE: Folate: 12.7 ng/mL (ref >5.9)

## 2017-09-18 LAB — RETICULOCYTES
RBC.: 3 MIL/uL — AB (ref 3.87–5.11)
RETIC COUNT ABSOLUTE: 117 10*3/uL (ref 19.0–186.0)
Retic Ct Pct: 3.9 % — ABNORMAL HIGH (ref 0.4–3.1)

## 2017-09-18 LAB — IRON AND TIBC
IRON: 31 ug/dL (ref 28–170)
Saturation Ratios: 11 % (ref 10.4–31.8)
TIBC: 281 ug/dL (ref 250–450)
UIBC: 250 ug/dL

## 2017-09-18 LAB — VITAMIN B12: Vitamin B-12: 1465 pg/mL — ABNORMAL HIGH (ref 180–914)

## 2017-09-18 LAB — PH, BODY FLUID: pH, Body Fluid: 8.1

## 2017-09-18 LAB — FERRITIN: FERRITIN: 82 ng/mL (ref 11–307)

## 2017-09-18 MED ORDER — FUROSEMIDE 10 MG/ML IJ SOLN
40.0000 mg | Freq: Three times a day (TID) | INTRAMUSCULAR | Status: DC
  Administered 2017-09-18: 40 mg via INTRAVENOUS
  Filled 2017-09-18: qty 4

## 2017-09-18 MED ORDER — FUROSEMIDE 10 MG/ML IJ SOLN
60.0000 mg | Freq: Two times a day (BID) | INTRAMUSCULAR | Status: DC
  Administered 2017-09-19: 60 mg via INTRAVENOUS
  Filled 2017-09-18: qty 6

## 2017-09-18 MED ORDER — VANCOMYCIN HCL IN DEXTROSE 750-5 MG/150ML-% IV SOLN
750.0000 mg | Freq: Two times a day (BID) | INTRAVENOUS | Status: DC
  Administered 2017-09-19 – 2017-09-20 (×4): 750 mg via INTRAVENOUS
  Filled 2017-09-18 (×4): qty 150

## 2017-09-18 MED ORDER — VANCOMYCIN HCL 10 G IV SOLR
1750.0000 mg | Freq: Once | INTRAVENOUS | Status: AC
  Administered 2017-09-18: 1750 mg via INTRAVENOUS
  Filled 2017-09-18: qty 1750

## 2017-09-18 MED ORDER — SODIUM CHLORIDE 0.9 % IV SOLN
2.0000 g | INTRAVENOUS | Status: AC
  Administered 2017-09-18 – 2017-09-22 (×4): 2 g via INTRAVENOUS
  Filled 2017-09-18 (×5): qty 2

## 2017-09-18 MED ORDER — SPIRONOLACTONE 25 MG PO TABS
100.0000 mg | ORAL_TABLET | Freq: Every day | ORAL | Status: DC
  Administered 2017-09-19 – 2017-09-21 (×3): 100 mg via ORAL
  Filled 2017-09-18 (×3): qty 4

## 2017-09-18 NOTE — Progress Notes (Signed)
RT Note: Patient's ABG results were paged to Dr. Sherral Hammers. Dr. Sherral Hammers would like to increase her oxygen slightly since her Spo2 is on the low end of his Spo2 goal of >/= 89%. She will be placed on a high flow non heated nasal cannula to help with oxygenation. Bipap is not presently indicated since patient is in no acute respiratory distress. Rt will continue to monitor and assist as needed.

## 2017-09-18 NOTE — Consult Note (Signed)
Consultation  Referring Provider: Joette Catching, MD     Primary Care Physician:  Unk Pinto, MD Primary Gastroenterologist:  Dr. Havery Moros Reason for Consultation: Hepatic hydrothorax- consideration for TIPS       Impression / Plan:   1. Acute on chronic respiratory failure- Currently on 4L nasal cannula O2 support.  2. Recurrent pleural effusion- possibly d/t underlying hepatic hydrothorax. thoracentesis scheduled for today. Last thoracentesis on 2/12 with 949m fluid drawn.  3. Pneumonia- Pt on cefepime and Vanc IV per pharmacy  3. Non-alcoholic cirrhosis/HCC- s/p CT guided ablation. Moderate ascites and anasarca   4. Grade 2 chronic diastolic CHF- managed on bisoprolol, furosemide. Newly found at last hospitalization.  5. COPD- on neb/steroids, thoracentesis should provide more relief  6. Macrocytic anemia- B12 and folate levels pending  7. CKD- sCr 2/6 was 1.3, hospitalist following given ongoing diuretic use  8. Obesity- BMI 36.09 kg/m2  TIPS: Child Pugh class B- score 9- significant functional compromise.  Per up to date, TIPS may be considered in pts with score less than 10 with caution. Will discuss further with GI team.   HF is an absolute contraindication for TIPS however her diastolic HF has been a new diagnosis at her last hospitalization, could be d/t respiratory failure. At that time her systolic function was preserved with grade 2 diastolic dysfxn.   Recommend another echo once acute respiratory failure improves? Will discuss with Dr. GDominga FerryGI Attending   I have personally seen the patient, reviewed and repeated key elements of the history and physical and participated in formation of the assessment and plan the student has documented.  Complicated situation.  She has not had maximal diuretic Tx or even close to it at this point so think we need to try that first. She also is quite wheezy today and ? Other resp process - seems  like COPD flare +/- PNA  I do not think we need a repeat Echo  Other ? Are how much would encephalopathy worsen w/ TIPS and would [facility] be [facility]  At any rate Will add spironolactone 100 mg qd  Would do bid furosemide (60) - ordered  CGatha Mayer MD, FAsante [facility] [phone](pager) 09/18/2017 4:34 PM         HPI:   Kerry BALLINGERis a 72y.o. female with PMH of COPD, hepatic encephalopathy on chronic lactulose, non-etoh liver cirrhosis, H[place] Nov 2018 s/p CT guided ablation. Hospitalization from 2/2 to 2/8 for anasarca and acute of chronic respiratory failure s/p thoracentesis. She returned to the ED on 2/11 for recurrent respiratory failure and CXR subsequently showed re-accumulation of R sided pleural effusion, last thoracentesis 2/12 9046mfluid drawn. Dr. McThereasa Soloonsulted GI due to suspected underlying hepatic hydrothorax as culprit for recurrent pleural effusion and to consider TIPS.  Other GI hx include hx of gastritis, esophageal varices- s/p banding and managed on nadalol 4056md, dysphagia w/o stricture s/p post balloon dilation.   She was seen by GI today, she appears alert and oriented, husband at bedside. She is currently having some dyspnea, no significant SOB. Reports some abdominal pain/soreness due to underlying ascites. Has LE edema and anasarca, not jaundiced at this time. Not experiencing any CP, palpitations, N/V/D, dysuria, fever or chills.   Past Medical History:  Diagnosis Date  . Asthma   . [facility]  [facility]   . COPD (chronic obstructive pulmonary  disease) ([place])    x 3 years   . Depression   . Diabetes mellitus without complication ([place])    diet controlled  not on meds   . Edema    left leg   . Esophageal varices ([place])   . Fibromyalgia   . Gastritis   . GERD (gastroesophageal reflux disease)   . Hepatic cirrhosis ([place])   . Hepatic encephalopathy ([place])   . History of blood transfusion     . Hyperlipidemia   . Hypertension   . IBS (irritable bowel syndrome)   . Phlebitis    left leg x 2   . Pneumonia    hx of   . Splenomegaly   . Vitamin D deficiency     Past Surgical History:  Procedure Laterality Date  . ABDOMINAL HYSTERECTOMY    . BACK SURGERY    . CATARACT EXTRACTION, BILATERAL    . CHOLECYSTECTOMY    . ESOPHAGOGASTRODUODENOSCOPY  07/16/2012   Procedure: ESOPHAGOGASTRODUODENOSCOPY (EGD);  Surgeon: Inda Castle, MD;  Location: [facility];  Service: Endoscopy;  Laterality: N/A;  . GASTRIC VARICES BANDING  07/16/2012   Procedure: GASTRIC VARICES BANDING;  Surgeon: Inda Castle, MD;  Location: [facility] ENDOSCOPY;  Service: Endoscopy;  Laterality: N/A;  . HIP ARTHROPLASTY Bilateral   . IR GENERIC HISTORICAL  06/25/2016   IR RADIOLOGIST EVAL & MGMT 06/25/2016 [facility]  . IR GENERIC HISTORICAL  06/27/2016   IR KYPHO LUMBAR INC FX REDUCE BONE BX UNI/BIL CANNULATION INC/IMAGING 06/27/2016 Luanne Bras, MD [facility]  . IR GENERIC HISTORICAL  07/17/2016   IR RADIOLOGIST EVAL & MGMT 07/17/2016 [facility]  . IR RADIOLOGIST EVAL & MGMT  07/10/2017  . IR THORACENTESIS ASP PLEURAL SPACE W/IMG GUIDE  09/12/2017  . IR THORACENTESIS ASP PLEURAL SPACE W/IMG GUIDE  09/17/2017  . NECK SURGERY    . RADIOFREQUENCY ABLATION N/A 08/30/2017   Procedure: CT MICROWAVE THERMAL ABLATION;  Surgeon: Arne Cleveland, MD;  Location: [facility] ORS;  Service: Anesthesiology;  Laterality: N/A;  . SHOULDER SURGERY Right     Family History  Problem Relation Age of Onset  . Diabetes Brother        deceased  . Heart disease Sister        A Fib  . Heart disease Mother        CHF  . Atrial fibrillation Sister   . Cancer Father        lung cancer   . Cancer Maternal Uncle        unknown type cancer   . Cancer Other        lung cancer   . Colon cancer Neg Hx      Social History   Tobacco Use  . Smoking status: Former Smoker    Packs/day: 1.00    Years: 50.00    Pack years:  50.00    Types: Cigarettes    Last attempt to quit: 11/05/2014    Years since quitting: 2.8  . Smokeless tobacco: Never Used  . Tobacco comment: Quit April 2016  Substance Use Topics  . Alcohol use: No    Alcohol/week: 0.0 oz  . Drug use: No    Prior to Admission medications   Medication Sig Start Date End Date Taking? Authorizing Provider  albuterol (PROVENTIL HFA;VENTOLIN HFA) 108 (90 Base) MCG/ACT inhaler Inhale 2 puffs into the lungs every 6 (six) hours as needed for wheezing or shortness of breath. 05/27/16  Yes Ward, Delice Bison, DO  allopurinol (  ZYLOPRIM) 300 MG tablet take 1/2-1 tablet by mouth daily for gout Patient taking differently: take 1 tablet (300 mg) by mouth daily with supper - for gout 04/29/17  Yes Unk Pinto, MD  aspirin EC 81 MG tablet Take 81 mg by mouth at bedtime.   Yes [provider]  bisoprolol (ZEBETA) 10 MG tablet Take 1 tablet (10 mg total) by mouth daily. Patient taking differently: Take 10 mg by mouth daily with supper.  04/09/17  Yes Vicie Mutters, PA-C  budesonide (PULMICORT) 0.25 MG/2ML nebulizer solution Take 2 mLs (0.25 mg total) by nebulization 2 (two) times daily. 09/13/17  Yes Oretha Milch D, MD  Cholecalciferol (VITAMIN D3) 5000 units CAPS Take 5,000 Units by mouth daily with breakfast.   Yes [provider]  citalopram (CELEXA) 40 MG tablet Take 1 tablet (40 mg total) by mouth 2 (two) times daily. Patient taking differently: Take 40 mg by mouth daily with supper.  06/18/17  Yes Vicie Mutters, PA-C  cyclobenzaprine (FLEXERIL) 10 MG tablet Take 1 tablet (10 mg total) by mouth 3 (three) times daily as needed for muscle spasms. 03/06/16  Yes Unk Pinto, MD  dextromethorphan-guaiFENesin [facility] DM) 30-600 MG 12hr tablet Take 1 tablet by mouth 2 (two) times daily as needed for cough. 09/13/17  Yes Oretha Milch D, MD  ferrous sulfate 325 (65 FE) MG tablet Take 325 mg by mouth daily.    Yes [provider]  gabapentin  (NEURONTIN) 600 MG tablet Take 1/2 to 1 tablet 3 times a day for neuropathy pain in legs Patient taking differently: Take 600 mg by mouth 3 (three) times daily. for neuropathy pain in legs 06/24/17  Yes Vicie Mutters, PA-C  ipratropium (ATROVENT) 0.02 % nebulizer solution use 1 vial in nebulizer EVERY 4 HOURS AS NEEDED FOR WHEEZING OR SHORTNESS OF BREATH 75/15=5 08/09/17  Yes Unk Pinto, MD  lactulose (CHRONULAC) 10 GM/15ML solution take 75ms TWICE DAILY Patient taking differently: Take 10 g by mouth daily.  08/08/17  Yes MUnk Pinto MD  Magnesium 250 MG TABS Take 250 mg by mouth at bedtime.    Yes [provider]  metolazone (ZAROXOLYN) 5 MG tablet Take 1 tablet (5 mg total) by mouth once a week. Take once a week with lasix or as directed for fluid overload Patient taking differently: Take 2.5 mg by mouth See admin instructions. Take 1/2 tablet (2.5 mg) by mouth daily as needed for weight gain of 2-3 lbs overnight 09/04/17  Yes MUnk Pinto MD  NONFORMULARY OR COMPOUNDED ITEM Apply 1 application topically See admin instructions. SALICYLIC ACID ACID (PETROLATUM) 50% OINTMENT-APPLY TO AFFECTED AREA AT BEDTIME PRN DRY SKIN/LEGS ([place]   Yes [provider]  nystatin cream (MYCOSTATIN) Apply 1 application topically 2 (two) times daily. Patient taking differently: Apply 1 application topically 2 (two) times daily as needed for dry skin (itching).  12/20/16  Yes CVicie Mutters PA-C  ondansetron (ZOFRAN) 4 MG tablet Take 1 tablet (4 mg total) by mouth every 6 (six) hours as needed for nausea or vomiting. 09/02/17  Yes CVicie Mutters PA-C  OXYGEN Inhale 2-3 L into the lungs continuous. 3 L with exertion   Yes [provider]  pantoprazole (PROTONIX) 40 MG tablet Take 1 tablet (40 mg total) by mouth daily. Patient taking differently: Take 40 mg by mouth daily with supper.  04/22/17  Yes MUnk Pinto MD  potassium chloride  (K-DUR,KLOR-CON) 10 MEQ tablet Take 3 tablets (30 mEq total) by mouth  3 (three) times daily. Patient taking differently: Take 20-30 mEq by mouth See admin instructions. Take 3 tablets (30 meq) by mouth every morning, take 2 tablets (20 meq) mid afternoon and at bedtime 01/07/17  Yes Vicie Mutters, PA-C  rifampin (RIFADIN) 300 MG capsule Take 1 capsule 2 x / day for Liver Patient taking differently: Take 300 mg by mouth 2 (two) times daily with a meal. for Liver 07/03/17 01/31/18 Yes Unk Pinto, MD  terconazole (TERAZOL 7) 0.4 % vaginal cream Place 1 applicator vaginally at bedtime. Patient taking differently: Place 1 applicator vaginally at bedtime as needed (itching).  12/20/16  Yes Vicie Mutters, PA-C  torsemide (DEMADEX) 10 MG tablet Take 3 tablets (30 mg total) by mouth every morning. 09/13/17 10/13/17 Yes Oretha Milch D, MD  torsemide (DEMADEX) 20 MG tablet Take 3 tablets (60 mg total) by mouth every evening. 09/14/17 10/14/17 Yes Desiree Hane, MD  traMADol (ULTRAM) 50 MG tablet Take 50 mg by mouth every 8 hours as needed for pain Patient taking differently: Take 50 mg by mouth every 8 (eight) hours as needed (pain).  05/07/17  Yes Vicie Mutters, PA-C  triamcinolone cream (KENALOG) 0.1 % Apply 1 application topically 3 (three) times daily as needed for pain Patient taking differently: Apply 1 application topically 3 (three) times daily as needed (itching).  12/20/16  Yes Vicie Mutters, PA-C  glucose blood test strip Take sugars once daily 01/21/17   Vicie Mutters, PA-C    Current Facility-Administered Medications  Medication Dose Route Frequency Provider Last Rate Last Dose  . acetaminophen (TYLENOL) tablet 500 mg  500 mg Oral Q6H PRN Joette Catching T, MD      . albuterol (PROVENTIL) (2.5 MG/3ML) 0.083% nebulizer solution 2.5 mg  2.5 mg Nebulization Q2H PRN Cherene Altes, MD   2.5 mg at 09/18/17 0656  . bisoprolol (ZEBETA) tablet 10 mg  10 mg Oral Q supper Cherene Altes,  MD   10 mg at 09/17/17 1702  . budesonide (PULMICORT) nebulizer solution 0.25 mg  0.25 mg Nebulization BID Dyanne Carrel M, NP   0.25 mg at 09/17/17 1928  . ceFEPIme (MAXIPIME) 2 g in sodium chloride 0.9 % 100 mL IVPB  2 g Intravenous Q24H Allie Bossier, MD      . chlorhexidine (PERIDEX) 0.12 % solution 15 mL  15 mL Mouth Rinse BID Karmen Bongo, MD   15 mL at 09/18/17 0942  . citalopram (CELEXA) tablet 40 mg  40 mg Oral Q supper Cherene Altes, MD   40 mg at 09/17/17 1702  . cyclobenzaprine (FLEXERIL) tablet 10 mg  10 mg Oral TID PRN Cherene Altes, MD      . furosemide (LASIX) injection 40 mg  40 mg Intravenous TID Allie Bossier, MD      . gabapentin (NEURONTIN) tablet 600 mg  600 mg Oral TID Radene Gunning, NP   600 mg at 09/18/17 0944  . guaiFENesin-dextromethorphan (ROBITUSSIN DM) 100-10 MG/5ML syrup 5 mL  5 mL Oral Q4H PRN Cherene Altes, MD   5 mL at 09/18/17 0345  . ipratropium-albuterol (DUONEB) 0.5-2.5 (3) MG/3ML nebulizer solution 3 mL  3 mL Nebulization Q6H Cherene Altes, MD   3 mL at 09/18/17 0151  . lactulose (CHRONULAC) 10 GM/15ML solution 10 g  10 g Oral Daily Radene Gunning, NP   10 g at 09/18/17 0277  . lidocaine (XYLOCAINE) 1 % (with pres) injection    PRN Docia Barrier,  PA   10 mL at 09/17/17 1240  . MEDLINE mouth rinse  15 mL Mouth Rinse q12n4p Karmen Bongo, MD      . methylPREDNISolone sodium succinate (SOLU-MEDROL) 40 mg/mL injection 40 mg  40 mg Intravenous Q12H Radene Gunning, NP   40 mg at 09/18/17 0944  . ondansetron (ZOFRAN) tablet 4 mg  4 mg Oral Q6H PRN Radene Gunning, NP       Or  . ondansetron [facility]) injection 4 mg  4 mg Intravenous Q6H PRN Radene Gunning, NP   4 mg at 09/18/17 1258  . oxyCODONE (Oxy IR/ROXICODONE) immediate release tablet 5 mg  5 mg Oral Q6H PRN Cherene Altes, MD   5 mg at 09/18/17 0645  . potassium chloride (K-DUR,KLOR-CON) CR tablet 30 mEq  30 mEq Oral Daily Karmen Bongo, MD   30 mEq at 09/18/17 0944    . potassium chloride SA (K-DUR,KLOR-CON) CR tablet 20 mEq  20 mEq Oral 2 times per day Karmen Bongo, MD   20 mEq at 09/17/17 2126  . rifampin (RIFADIN) capsule 300 mg  300 mg Oral BID WC Radene Gunning, NP   300 mg at 09/18/17 7564  . traMADol (ULTRAM) tablet 50 mg  50 mg Oral Q12H PRN Cherene Altes, MD   50 mg at 09/18/17 0032  . vancomycin (VANCOCIN) 1,750 mg in sodium chloride 0.9 % 500 mL IVPB  1,750 mg Intravenous Once Romona Curls, [facility]      . [START ON 09/19/2017] vancomycin (VANCOCIN) IVPB 750 mg/150 ml premix  750 mg Intravenous Q12H Romona Curls, [place]        Allergies as of 09/16/2017 - Review Complete 09/16/2017  Allergen Reaction Noted  . Atorvastatin Other (See Comments)   . Diphenhydramine hcl (sleep) Hives 07/22/2015  . Hydrocodone-acetaminophen Other (See Comments) 07/22/2015  . Lopid [gemfibrozil] Other (See Comments) 07/22/2015  . Loratadine Hives 07/22/2015  . Lorazepam Hives 06/10/2008  . Simvastatin Other (See Comments)   . Sulfamethoxazole Hives 07/22/2015  . Sulfonamide derivatives Hives      Review of Systems:    This is positive for those things mentioned in the HPI. All other review of systems are negative.     Physical Exam:  Vital signs in last 24 hours: Temp:  [98.3 F (36.8 C)-98.8 F (37.1 C)] 98.4 F (36.9 C) (02/13 1247) Pulse Rate:  [80-96] 89 (02/13 0312) Resp:  [14-21] 14 (02/13 0828) BP: (122-146)/(45-65) 135/60 (02/13 1247) SpO2:  [91 %-100 %] 94 % (02/13 0828) FiO2 (%):  [28 %] 28 % (02/12 1421) Weight:  [86.8 kg (191 lb 6.4 oz)] 86.8 kg (191 lb 6.4 oz) (02/13 0458) Last BM Date: 09/17/17  General:  Well-developed, obese female and in no acute distress Eyes:  anicteric. ENT:   Mouth and posterior pharynx free of lesions.  Lungs: Diffuse wheezes heart bilaterally with some rhonchi and extra breath sounds. No use of accessory muscles. No cyanosis or clubbing noted. Heart:  S1S2, no rubs, murmurs, gallops. Abdomen: Firm,  mildly tender, distended, BS+ Extremities:   1+ edema on RLE, 2+ edema on LLE, 1+ edema on RUE Neuro:  A&O x 3.  Psych:  appropriate mood and  Affect.   Data Reviewed: inpatient notes, labs, meds, procedures, imaging.   LAB RESULTS: Recent Labs    09/16/17 1253 09/17/17 0407 09/18/17 0208  WBC 6.3 3.4* 5.5  HGB 9.8* 8.3* 9.6*  HCT 32.2* 27.8* 31.9*  PLT 140* 112* 136*  BMET Recent Labs    09/16/17 1253 09/17/17 0407 09/18/17 0208  NA 139 140 139  K 4.0 4.0 3.9  CL 100* 99* 99*  CO2 28 30 28   GLUCOSE 126* 153* 161*  BUN 25* 25* 27*  CREATININE 1.33* 1.23* 1.31*  CALCIUM 8.7* 8.5* 8.6*   LFT Recent Labs    09/18/17 0208  PROT 4.8*  ALBUMIN 2.7*  AST 33  ALT 26  ALKPHOS 116  BILITOT 1.2   PT/INR Recent Labs    09/17/17 0708  LABPROT 15.4*  INR 1.23    STUDIES: images of xrays viewed Dg Chest 1 View  Result Date: 09/17/2017 CLINICAL DATA:  Status post left-sided thoracentesis. History of asthma-COPD, former smoker. EXAM: CHEST 1 VIEW COMPARISON:  Portable chest x-ray of September 16, 2017 FINDINGS: Aeration of the right lung has improved since the thoracentesis. A small amount of pleural fluid persists. There is no postprocedure pneumothorax. The left lung remains well expanded. There is patchy density at the left base. The cardiac silhouette is top-normal in size. The central pulmonary vascularity is mildly prominent. There is calcification in the wall of the aortic arch. IMPRESSION: No postprocedure complication following right-sided thoracentesis. Small right pleural effusion remains. Underlying COPD.  Left basilar atelectasis or infiltrate. Thoracic aortic atherosclerosis. Electronically Signed   By: David  Martinique M.D.   On: 09/17/2017 13:12   Dg Chest Port 1 View  Result Date: 09/18/2017 CLINICAL DATA:  Pleural effusion follow-up EXAM: PORTABLE CHEST 1 VIEW COMPARISON:  09/17/2017, 09/16/2017 FINDINGS: Right lower lobe infiltrate has progressed.  Improvement in right effusion. Minimal left lower lobe airspace disease unchanged. Negative for heart failure. No effusion on the left. IMPRESSION: Progression of right lower lobe infiltrate, probable pneumonia. Improvement in right pleural effusion. Electronically Signed   By: Franchot Gallo M.D.   On: 09/18/2017 08:11   Ir Abdomen US Limited  Result Date: 09/17/2017 CLINICAL DATA:  72 year old with cirrhosis and hepatocellular carcinoma. Evaluate for a therapeutic paracentesis. EXAM: LIMITED ABDOMEN ULTRASOUND FOR ASCITES TECHNIQUE: Limited ultrasound survey for ascites was performed in all four abdominal quadrants. COMPARISON:  09/13/2017 FINDINGS: Again noted is a small amount of ascites.  No large fluid pockets. IMPRESSION: Small amount of ascites.  Therapeutic paracentesis not performed. Electronically Signed   By: Markus Daft M.D.   On: 09/17/2017 14:34   Ir Thoracentesis Asp Pleural Space W/img Guide  Result Date: 09/17/2017 INDICATION: Patient with recurrent right pleural effusion. Request is made for diagnostic and therapeutic thoracentesis. EXAM: ULTRASOUND GUIDED DIAGNOSTIC AND THERAPEUTIC RIGHT THORACENTESIS MEDICATIONS: 10 mL 2% lidocaine COMPLICATIONS: None immediate. PROCEDURE: An ultrasound guided thoracentesis was thoroughly discussed with the patient and questions answered. The benefits, risks, alternatives and complications were also discussed. The patient understands and wishes to proceed with the procedure. Written consent was obtained. Ultrasound was performed to localize and mark an adequate pocket of fluid in the right chest. The area was then prepped and draped in the normal sterile fashion. 2% Lidocaine was used for local anesthesia. Under ultrasound guidance a Safe-T-Centesis catheter was introduced. Thoracentesis was performed. The catheter was removed and a dressing applied. FINDINGS: A total of approximately 900 mL of yellow, cloudy fluid was removed. Samples were sent to the  laboratory as requested by the clinical team. IMPRESSION: Successful ultrasound guided diagnostic and therapeutic right thoracentesis yielding 900 mL of pleural fluid. Read by: Brynda Greathouse PA-C Electronically Signed   By: Markus Daft M.D.   On: 09/17/2017 14:00  PREVIOUS ENDOSCOPIES:            Last EGD- 10/22/2013 by Dr. Deatra Ina Last known colonoscopy- 06/17/2008   Thanks   LOS: 2 days   @[name]  Simonne Maffucci, MD, [facility] @  09/18/2017, 1:38 PM

## 2017-09-18 NOTE — Progress Notes (Signed)
RT Note: Patient has been transitioned to a non heated high flow nasal cannula and she is tolerating it very well. Her Spo2 is not 91%. Pt is in no apparent distress currently and RT will continue to monitor and assist as needed.

## 2017-09-18 NOTE — Progress Notes (Signed)
RT Note: Patient was stuck once for an ABG per MD order and venous blood was obtained. Another RT is coming presently to attempt to obtain the lab. Also, the order initially stated that the lab work was to be obtained on room air. RT called Dr. Sherral Hammers to notify him that the patient has a higher O2 demand this afternoon as well as some tachypnea which subsided after receiving her breathing treatment. RT feels that the patient would rapidly decline if we turned her oxygen off placing her on room air and that doing so could cause serious harm to the patient. Dr. Sherral Hammers agrees and wants the ABG obtained on her present oxygen settings. Rt will continue to monitor and assist as needed.

## 2017-09-18 NOTE — Progress Notes (Addendum)
PROGRESS NOTE    [name]  LNL:[id number] DOB: [birth year] DOA: 09/16/2017 PCP: Unk Pinto, MD   Brief Narrative:  72 y.o.WF PMHx Depression ,Diabetes mellitus without complication (diet controlled) COPD on 2-3 L O2 at home, non-alcohol related liver cirrhosis, Esophageal varices,Hepatic encephalopathy , and Hepatocellular Carcinoma diagnosed Nov 2018 s/p CT guided ablation with hospitalization 2/2 >>> 09/13/17 due to anasarca and Acute on Chronic Respiratory failure s/p thoracentesis Asthma, Fibromyalgia, HLD, HTN, IBS,Splenomegaly, Vitamin D deficiency who returned to the ED 2/11 with recurrent respiratory failure.  CXR showed re-accumulation of a right-sided pleural effusion..       Subjective: 2/13 A/O 4, positive SOB, positive abdominal distention, positive abdominal pain. States on 2-3 L O2 at home. Patient and husband state episodes of apnea witnessed. Has not had official sleep study but believes CPAP required. Dr. Havery Moros is her GI physician and has not been notified over last 2 admissions to hospital. Palliative care has not been involved in care.    Assessment & Plan:   Principal Problem:   Acute on chronic respiratory failure with hypoxemia (HCC) Active Problems:   Anemia   Hepatic cirrhosis (HCC)   Essential hypertension   Chronic respiratory failure (HCC)   Obesity (BMI 30-39.9)   COPD mixed type (HCC)   Anasarca   Hepatocellular carcinoma (HCC)   Ascites   Pleural effusion on right  Acute on Chronic Respiratory Failure with Hypoxia -discharge on 2/8  On 4L O2 via [place] -patient's O2 need secondary to RIGHT lung atelectasis and large pleural effusion. -Titrate O2 to maintain SPO2 89-93% -CPAP/BIPAP  QHS per respiratory to maintain SPO2 89-92%  COPD See respiratory failure  OSA/OHS -CPAP per respiratory -ABG. Obtain on room air requirement for Trilogy -Titrate O2 to maintain SPO2 89-93%  RLL pneumonia (HCAP) -2/13 PCXR consistent with RLL  pneumonia. Although patient negative leukocytosis patient is immunocompromised with HCC/liver failure may not mount immune response. Given that second hospitalization within 7 days. Start pneumonia protocol. 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 -S/P RIGHT Thoracentesis: 900 ml aspirated. Pathology/culture and stain pending -Consulted GI: Consider TIPS -Consider Pleurx cath if TIPS not appropriate  Nonalcoholic cirrhosis /Hepatocellular carcinoma -ammonia level daily -lactulose 10 g daily   Chronic diastolic CHF (base weight 84.5 kg at discharge on 2/8) -Strict in and out -Daily weight Filed Weights   09/16/17 2300 09/17/17 0536 09/18/17 0458  Weight: 197 lb 15.6 oz (89.8 kg) 197 lb 5 oz (89.5 kg) 191 lb 6.4 oz (86.8 kg)  -Lasix 40 mg TID  Macrocytic anemia -B-12/folate pending    Macrocytic anemia Check E08 and folic acid    CKD stage III (baseline Cr 1.3) -Follow Cr closely with diuretics Recent Labs  Lab 09/16/17 1253 09/17/17 0407 09/18/17 0208  CREATININE 1.33* 1.23* 1.31*  -stable crt 1.3 09/11/17 - follow crt w/ ongoing diuretic use    Obesity -Body mass index is 36.09 kg/m    GOALS of care -PALLIATIVE CARE consult: Hepatocellular carcinoma, recurrent pleural effusion, esophageal varices discuss short-term vs long-term goals of care vs hospice     DVT prophylaxis: SCD Code Status: dO NOT RESUSCITATE Family Communication: husband at bedside for discussion of plan care Disposition Plan: TBD   Consultants:  IR   Procedures/Significant Events:  2/12 S/P RIGHT Thoracentesis 900 ml aspirated     I have personally reviewed and interpreted all radiology studies  and my findings are as above.  VENTILATOR SETTINGS:    Cultures 2/12 RIGHT pleural fluid pending 2/13  respiratory virus panel pending 2/13 sputum pending    Antimicrobials: Anti-infectives (From admission, onward)   Start     Stop   09/19/17 0100  vancomycin (VANCOCIN) IVPB 750 mg/150 ml premix         09/18/17 1230  ceFEPIme (MAXIPIME) 2 g in sodium chloride 0.9 % 100 mL IVPB     09/26/17 1229   09/18/17 1215  vancomycin (VANCOCIN) 1,750 mg in sodium chloride 0.9 % 500 mL IVPB         09/16/17 1700  rifampin (RIFADIN) capsule 300 mg             Devices    LINES / TUBES:      Continuous Infusions:   Objective: Vitals:   09/17/17 2352 09/18/17 0151 09/18/17 0312 09/18/17 0458  BP: (!) 135/48  (!) 122/45   Pulse: 80 85 89   Resp: (!) 21 17 18    Temp: 98.3 F (36.8 C)  98.3 F (36.8 C)   TempSrc: Oral  Oral   SpO2: 93% 94% 91%   Weight:    191 lb 6.4 oz (86.8 kg)  Height:        Intake/Output Summary (Last 24 hours) at 09/18/2017 0748 Last data filed at 09/18/2017 0455 Gross per 24 hour  Intake 960 ml  Output 202 ml  Net 758 ml   Filed Weights   09/16/17 2300 09/17/17 0536 09/18/17 0458  Weight: 197 lb 15.6 oz (89.8 kg) 197 lb 5 oz (89.5 kg) 191 lb 6.4 oz (86.8 kg)    Examination:  General: A/O 4, acute on chronic respiratory distress Neck:  Negative scars, masses, torticollis, lymphadenopathy, JVD Lungs: tachypnea positive diffuse expiratory wheeze, negative crackles Cardiovascular: Tachycardic, Regular rhythm without murmur gallop or rub normal S1 and S2 Abdomen: Obese, positive abdominal pain, positive distention, positive soft, bowel sounds, no rebound, no ascites, no appreciable mass Extremities: No significant cyanosis, clubbing, or edema bilateral lower extremities Skin: Negative rashes, lesions, ulcers Psychiatric:  Negative depression, negative anxiety, negative fatigue, negative mania  Central nervous system:  Cranial nerves II through XII intact, tongue/uvula midline, all extremities muscle strength 5/5, sensation intact throughout, negative  dysarthria, negative expressive aphasia, negative receptive aphasia.  .     Data Reviewed: Care during the described time interval was provided by me .  I have reviewed this patient's available data, including medical history, events of note, physical examination, and all test results as part of my evaluation.   CBC: Recent Labs  Lab 09/13/17 1208 09/16/17 1253 09/17/17 0407 09/18/17 0208  WBC 6.1 6.3 3.4* 5.5  NEUTROABS  --  4.9  --   --   HGB 8.7* 9.8* 8.3* 9.6*  HCT 28.8* 32.2* 27.8* 31.9*  MCV 105.5* 108.8* 106.9* 106.3*  PLT 123* 140* 112* 182*   Basic Metabolic Panel: Recent Labs  Lab 09/11/17 0857 09/16/17 1253 09/17/17 0407 09/18/17 0208  NA 138 139 140 139  K 4.5 4.0 4.0 3.9  CL 98* 100* 99* 99*  CO2 27 28 30 28   GLUCOSE 161* 126* 153* 161*  BUN 28* 25* 25* 27*  CREATININE 1.29* 1.33* 1.23* 1.31*  CALCIUM 8.9 8.7* 8.5* 8.6*   GFR: Estimated Creatinine Clearance: 40.9 mL/min (A) (by C-G formula based on SCr of 1.31 mg/dL (H)). Liver Function Tests: Recent Labs  Lab 09/16/17 1253 09/17/17 0407 09/18/17 0208  AST 32  26 33  ALT 28 26 26   ALKPHOS 120 100 116  BILITOT 1.1 1.1 1.2  PROT 5.3* 4.6* 4.8*  ALBUMIN 2.9* 2.5* 2.7*   No results for input(s): LIPASE, AMYLASE in the last 168 hours. No results for input(s): AMMONIA in the last 168 hours. Coagulation Profile: Recent Labs  Lab 09/17/17 0708  INR 1.23   Cardiac Enzymes: No results for input(s): CKTOTAL, CKMB, CKMBINDEX, TROPONINI in the last 168 hours. BNP (last 3 results) No results for input(s): PROBNP in the last 8760 hours. HbA1C: No results for input(s): HGBA1C in the last 72 hours. CBG: Recent Labs  Lab 09/12/17 1703 09/12/17 2035 09/13/17 0824 09/13/17 1219 09/13/17 1738  GLUCAP 177* 203* 94 164* 161*   Lipid Profile: No results for input(s): CHOL, HDL, LDLCALC, TRIG, CHOLHDL, LDLDIRECT in the last 72 hours. Thyroid Function Tests: No results for input(s): TSH, T4TOTAL,  FREET4, T3FREE, THYROIDAB in the last 72 hours. Anemia Panel: Recent Labs    09/18/17 0208  VITAMINB12 1,465*  FOLATE 12.7  FERRITIN 82  TIBC 281  IRON 31  RETICCTPCT 3.9*   Urine analysis:    Component Value Date/Time   COLORURINE YELLOW 09/16/2017 1922   APPEARANCEUR CLEAR 09/16/2017 1922   LABSPEC 1.008 09/16/2017 [place] 5.0 09/16/2017 [place] NEGATIVE 09/16/2017 [place] NEGATIVE 09/16/2017 [place] NEGATIVE 09/16/2017 [place] NEGATIVE 09/16/2017 [place] NEGATIVE 09/16/2017 1922   UROBILINOGEN 1 01/13/2015 1145   NITRITE NEGATIVE 09/16/2017 1922   LEUKOCYTESUR TRACE (A) 09/16/2017 1922   Sepsis Labs: @LABRCNTIP (procalcitonin:4,lacticidven:4)  ) Recent Results (from the past 240 hour(s))  Gram stain     Status: None   Collection Time: 09/12/17 11:54 AM  Result Value Ref Range Status   Specimen Description PLEURAL RIGHT  Final   Special Requests NONE  Final   Gram Stain   Final    RARE WBC PRESENT,BOTH PMN AND MONONUCLEAR NO ORGANISMS SEEN Performed at [facility], [place] [address], [place], [place] [address]    Report Status 09/13/2017 FINAL  Final  Culture, body fluid-bottle     Status: None   Collection Time: 09/12/17 11:54 AM  Result Value Ref Range Status   Specimen Description PLEURAL RIGHT  Final   Special Requests NONE  Final   Culture   Final    NO GROWTH 5 DAYS Performed at [facility] [address] [place], [place] [address]    Report Status 09/17/2017 FINAL  Final  MRSA PCR Screening     Status: None   Collection Time: 09/16/17 11:37 PM  Result Value Ref Range Status   MRSA by PCR NEGATIVE NEGATIVE Final    Comment:        The GeneXpert MRSA Assay (FDA approved for NASAL specimens only), is one component of a comprehensive MRSA colonization surveillance program. It is not intended to diagnose MRSA infection nor to guide or monitor treatment for MRSA infections. Performed at  [facility], [place] [address] [place], [place] [address]   Body fluid culture     Status: None (Preliminary result)   Collection Time: 09/17/17  1:00 PM  Result Value Ref Range Status   Specimen Description PLEURAL  Final   Special Requests LEFT  Final   Gram Stain   Final    RARE WBC PRESENT, PREDOMINANTLY PMN NO ORGANISMS SEEN Performed at [facility], [place],  [place] [address]    Culture PENDING  Incomplete   Report Status PENDING  Incomplete         Radiology Studies: Dg Chest 1 View  Result Date: 09/17/2017 CLINICAL DATA:  Status post left-sided thoracentesis. History of asthma-COPD, former smoker. EXAM: CHEST 1 VIEW COMPARISON:  Portable chest x-ray of September 16, 2017 FINDINGS: Aeration of the right lung has improved since the thoracentesis. A small amount of pleural fluid persists. There is no postprocedure pneumothorax. The left lung remains well expanded. There is patchy density at the left base. The cardiac silhouette is top-normal in size. The central pulmonary vascularity is mildly prominent. There is calcification in the wall of the aortic arch. IMPRESSION: No postprocedure complication following right-sided thoracentesis. Small right pleural effusion remains. Underlying COPD.  Left basilar atelectasis or infiltrate. Thoracic aortic atherosclerosis. Electronically Signed   By: David  Martinique M.D.   On: 09/17/2017 13:12   Dg Chest Port 1 View  Result Date: 09/16/2017 CLINICAL DATA:  Patient status post thoracentesis 09/12/2017 for a right pleural effusion. Severe shortness of breath today. EXAM: PORTABLE CHEST 1 VIEW COMPARISON:  Single-view of the chest 09/12/2017. FINDINGS: The patient has a moderately large right pleural effusion which has markedly increased in size since the comparison examination. Associated basilar airspace disease is noted. The left lung is clear. There is cardiomegaly. IMPRESSION: Moderately large right pleural effusion is  markedly increased since the post thoracentesis exam. Electronically Signed   By: Inge Rise M.D.   On: 09/16/2017 13:18   Ir Abdomen US Limited  Result Date: 09/17/2017 CLINICAL DATA:  72 year old with cirrhosis and hepatocellular carcinoma. Evaluate for a therapeutic paracentesis. EXAM: LIMITED ABDOMEN ULTRASOUND FOR ASCITES TECHNIQUE: Limited ultrasound survey for ascites was performed in all four abdominal quadrants. COMPARISON:  09/13/2017 FINDINGS: Again noted is a small amount of ascites.  No large fluid pockets. IMPRESSION: Small amount of ascites.  Therapeutic paracentesis not performed. Electronically Signed   By: Markus Daft M.D.   On: 09/17/2017 14:34   Ir Thoracentesis Asp Pleural Space W/img Guide  Result Date: 09/17/2017 INDICATION: Patient with recurrent right pleural effusion. Request is made for diagnostic and therapeutic thoracentesis. EXAM: ULTRASOUND GUIDED DIAGNOSTIC AND THERAPEUTIC RIGHT THORACENTESIS MEDICATIONS: 10 mL 2% lidocaine COMPLICATIONS: None immediate. PROCEDURE: An ultrasound guided thoracentesis was thoroughly discussed with the patient and questions answered. The benefits, risks, alternatives and complications were also discussed. The patient understands and wishes to proceed with the procedure. Written consent was obtained. Ultrasound was performed to localize and mark an adequate pocket of fluid in the right chest. The area was then prepped and draped in the normal sterile fashion. 2% Lidocaine was used for local anesthesia. Under ultrasound guidance a Safe-T-Centesis catheter was introduced. Thoracentesis was performed. The catheter was removed and a dressing applied. FINDINGS: A total of approximately 900 mL of yellow, cloudy fluid was removed. Samples were sent to the laboratory as requested by the clinical team. IMPRESSION: Successful ultrasound guided diagnostic and therapeutic right thoracentesis yielding 900 mL of pleural fluid. Read by: Brynda Greathouse PA-C  Electronically Signed   By: Markus Daft M.D.   On: 09/17/2017 14:00        Scheduled Meds: . bisoprolol  10 mg Oral Q supper  . budesonide  0.25 mg Nebulization BID  . chlorhexidine  15 mL Mouth Rinse BID  . citalopram  40 mg Oral Q supper  . furosemide  40 mg Intravenous BID  . gabapentin  600 mg Oral TID  .  ipratropium-albuterol  3 mL Nebulization Q6H  . lactulose  10 g Oral Daily  . mouth rinse  15 mL Mouth Rinse q12n4p  . methylPREDNISolone (SOLU-MEDROL) injection  40 mg Intravenous Q12H  . potassium chloride  30 mEq Oral Daily  . potassium chloride  20 mEq Oral 2 times per day  . rifampin  300 mg Oral BID WC   Continuous Infusions:   LOS: 2 days    Time spent: 40 minutes    WOODS, Geraldo Docker, MD [facility] Pager [phone]   If 7PM-7AM, please contact night-coverage [email or web address] Password [facility] 09/18/2017, 7:48 AM

## 2017-09-18 NOTE — Progress Notes (Signed)
Pt offered BIPAP for night time use. Pt declined due to constantly coughing productively.  BIPAP with mask at bedside for pt use.  Will cont to monitor patient and offer BIPAP.

## 2017-09-18 NOTE — Progress Notes (Signed)
Pharmacy Antibiotic Note  [name] is a 72 y.o. female admitted on 09/16/2017 with pneumonia.  Pharmacy has been consulted for vancomycin dosing. Also started on cefepime per MD. S/p thoracentesis on 2/12. Afebrile, WBC wnl. SCr 1.31 stable, CrCl~40-45.  Plan: Cefepime 2g IV q24h per MD Vancomycin 1737m IV x 1; then 7567mIV q12h Monitor clinical progress, c/s, renal function F/u de-escalation plan/LOT, vancomycin trough as indicated   Height: 5' 2"  (157.5 cm) Weight: 191 lb 6.4 oz (86.8 kg)(pt wanted to get up and so i did a standing wt) IBW/kg (Calculated) : 50.1  Temp (24hrs), Avg:98.5 F (36.9 C), Min:98.3 F (36.8 C), Max:98.8 F (37.1 C)  Recent Labs  Lab 09/13/17 1208 09/16/17 1253 09/17/17 0407 09/18/17 0208  WBC 6.1 6.3 3.4* 5.5  CREATININE  --  1.33* 1.23* 1.31*    Estimated Creatinine Clearance: 40.9 mL/min (A) (by C-G formula based on SCr of 1.31 mg/dL (H)).    Allergies  Allergen Reactions  . Atorvastatin Other (See Comments)    Unknown reaction  . Diphenhydramine Hcl (Sleep) Hives  . Hydrocodone-Acetaminophen Other (See Comments)    Unknown reaction  . Lopid [Gemfibrozil] Other (See Comments)    Unknown reaction  . Loratadine Hives  . Lorazepam Hives  . Simvastatin Other (See Comments)    Unknown reaction  . Sulfamethoxazole Hives  . Sulfonamide Derivatives Hives    Antimicrobials this admission: 2/13 vanc >>  2/13 cefepime >>   Dose adjustments this admission:  Microbiology results: 2/12 L pleural fluid: ngtd 2/11 MRSA PCR: neg 2/7 R pleural fluid: neg  HaElicia LampPharmD, BCPS Clinical Pharmacist Clinical phone for 09/18/2017 until 3:30pm: [phone] after 3:30pm, please call main pharmacy at: [phone] 09/18/2017 12:10 PM

## 2017-09-19 ENCOUNTER — Inpatient Hospital Stay (HOSPITAL_COMMUNITY): Payer: PPO

## 2017-09-19 DIAGNOSIS — J441 Chronic obstructive pulmonary disease with (acute) exacerbation: Secondary | ICD-10-CM

## 2017-09-19 DIAGNOSIS — E669 Obesity, unspecified: Secondary | ICD-10-CM

## 2017-09-19 DIAGNOSIS — J189 Pneumonia, unspecified organism: Secondary | ICD-10-CM

## 2017-09-19 DIAGNOSIS — C220 Liver cell carcinoma: Secondary | ICD-10-CM

## 2017-09-19 DIAGNOSIS — Z515 Encounter for palliative care: Secondary | ICD-10-CM

## 2017-09-19 DIAGNOSIS — Z7189 Other specified counseling: Secondary | ICD-10-CM

## 2017-09-19 DIAGNOSIS — J121 Respiratory syncytial virus pneumonia: Principal | ICD-10-CM

## 2017-09-19 DIAGNOSIS — J9621 Acute and chronic respiratory failure with hypoxia: Secondary | ICD-10-CM

## 2017-09-19 DIAGNOSIS — E662 Morbid (severe) obesity with alveolar hypoventilation: Secondary | ICD-10-CM

## 2017-09-19 LAB — HIV ANTIBODY (ROUTINE TESTING W REFLEX): HIV Screen 4th Generation wRfx: NONREACTIVE

## 2017-09-19 LAB — CBC WITH DIFFERENTIAL/PLATELET
BASOS ABS: 0 10*3/uL (ref 0.0–0.1)
Basophils Relative: 0 %
Eosinophils Absolute: 0 10*3/uL (ref 0.0–0.7)
Eosinophils Relative: 0 %
HEMATOCRIT: 30.8 % — AB (ref 36.0–46.0)
HEMOGLOBIN: 9.3 g/dL — AB (ref 12.0–15.0)
LYMPHS ABS: 0.6 10*3/uL — AB (ref 0.7–4.0)
LYMPHS PCT: 10 %
MCH: 32.9 pg (ref 26.0–34.0)
MCHC: 30.2 g/dL (ref 30.0–36.0)
MCV: 108.8 fL — AB (ref 78.0–100.0)
Monocytes Absolute: 0.3 10*3/uL (ref 0.1–1.0)
Monocytes Relative: 5 %
NEUTROS ABS: 5 10*3/uL (ref 1.7–7.7)
Neutrophils Relative %: 85 %
PLATELETS: 104 10*3/uL — AB (ref 150–400)
RBC: 2.83 MIL/uL — AB (ref 3.87–5.11)
RDW: 16.4 % — ABNORMAL HIGH (ref 11.5–15.5)
WBC: 5.9 10*3/uL (ref 4.0–10.5)

## 2017-09-19 LAB — COMPREHENSIVE METABOLIC PANEL
ALBUMIN: 2.8 g/dL — AB (ref 3.5–5.0)
ALT: 26 U/L (ref 14–54)
ANION GAP: 11 (ref 5–15)
AST: 35 U/L (ref 15–41)
Alkaline Phosphatase: 114 U/L (ref 38–126)
BILIRUBIN TOTAL: 1.5 mg/dL — AB (ref 0.3–1.2)
BUN: 21 mg/dL — ABNORMAL HIGH (ref 6–20)
CHLORIDE: 100 mmol/L — AB (ref 101–111)
CO2: 30 mmol/L (ref 22–32)
Calcium: 8.5 mg/dL — ABNORMAL LOW (ref 8.9–10.3)
Creatinine, Ser: 1.24 mg/dL — ABNORMAL HIGH (ref 0.44–1.00)
GFR calc Af Amer: 50 mL/min — ABNORMAL LOW (ref >60)
GFR, EST NON AFRICAN AMERICAN: 43 mL/min — AB (ref >60)
Glucose, Bld: 178 mg/dL — ABNORMAL HIGH (ref 65–99)
POTASSIUM: 4.1 mmol/L (ref 3.5–5.1)
Sodium: 141 mmol/L (ref 135–145)
TOTAL PROTEIN: 5.1 g/dL — AB (ref 6.5–8.1)

## 2017-09-19 LAB — MAGNESIUM: Magnesium: 2 mg/dL (ref 1.7–2.4)

## 2017-09-19 LAB — LACTIC ACID, PLASMA: Lactic Acid, Venous: 2.1 mmol/L (ref 0.5–1.9)

## 2017-09-19 MED ORDER — ACETYLCYSTEINE 20 % IN SOLN
4.0000 mL | Freq: Two times a day (BID) | RESPIRATORY_TRACT | Status: DC
  Administered 2017-09-19 – 2017-09-21 (×5): 4 mL via RESPIRATORY_TRACT
  Filled 2017-09-19 (×5): qty 4

## 2017-09-19 MED ORDER — FUROSEMIDE 10 MG/ML IJ SOLN
60.0000 mg | Freq: Three times a day (TID) | INTRAMUSCULAR | Status: DC
  Administered 2017-09-19 – 2017-09-20 (×5): 60 mg via INTRAVENOUS
  Filled 2017-09-19 (×5): qty 6

## 2017-09-19 NOTE — Progress Notes (Signed)
   Patient Name: [name] Date of Encounter: 09/19/2017, 1:18 PM    Subjective  Pt reports that she is doing better today, she is able to breathe better and her swelling has decreased. Continues to [facility] appropriately. Reports cough that is also improving and some abdominal fullness/pain. Denies CP, dysuria, fever, or chills.   Objective  BP (!) 144/50   Pulse 81   Temp 98.8 F (37.1 C) (Oral)   Resp 17   Ht 5' 2"  (1.575 m)   Wt 88.3 kg (194 lb 9.6 oz)   SpO2 94%   BMI 35.59 kg/m   General:  NAD Eyes:   anicteric Lungs:  Some diffuse wheezing heard- lungs sound much better compared to yday Heart::  S1S2 no rubs, murmurs or gallops Abdomen:  Soft, distended, moderately tender, BS+ Ext:   1+ edema BLE, improving    Assessment and Plan   1. Acute on chronic respiratory failure- ABG shows respiratory acidosis with compensation. Respiratory therapy following.   2. Recurrent pleural effusion- suspected to be d/t underlying hepatic hyrdothorax. Last thoracentesis on 2/12, 95m fluid retrieved.   3. PNA- resp panel showed RSV. hospitalist and pharmacy to follow.   4. Non-alcoholic cirrhosis/hx of HCC- s/p CT guided ablation. Moderate ascites and anasarca. Started on spironolactone 106mqd  5. Grade 2 chronic diastolic CHF- continue current meds. Furosemide increased to BID yesterday, continue.  6. Macrocytic anemia- B12 increased at 1,465 Folate wnl. Reticulocytes increased at 3.9%  7. CKD- sCr 2/6 1.3, followed by hospitalist  8. Child Pugh score 9, class B- significant functional compromise. Concerned about worsening encephalopathy if TIPS is performed. Not sure if TIPS is even possible after ablation.   Will discuss with Dr. GeCarlean Purls to what the next steps are.    CBC, CMP, Mg, Lactic acid for today pending.    [facility] Attending   I have personally seen the patient, reviewed and repeated key elements of the history and physical and participated in  formation of the assessment and plan the student has documented.   Would continue to push diuretics trying to increase over time  Could consider TIPS IF diuresis fails Pleur-ex catheter is a possible palliative option depending upon clinical course  I think PNA/COPD flare is driving pulm sxs now  She had KUB given some c/o distention and dyspnea and abd pain - it showed mild gastric distention which may be fro ascites  Will allow diet again  CaGatha MayerMD, FABaptist Medical Center Yazooastroenterology [phone]) 09/19/2017 7:09 PM

## 2017-09-19 NOTE — Progress Notes (Signed)
PROGRESS NOTE    [name]  QQP:[id number] DOB: [birth year] DOA: 09/16/2017 PCP: Unk Pinto, MD   Brief Narrative:  72 y.o.WF PMHx Depression ,Diabetes mellitus without complication (diet controlled) COPD on 2-3 L O2 at home, non-alcohol related liver cirrhosis, Esophageal varices,Hepatic encephalopathy , and Hepatocellular Carcinoma diagnosed Nov 2018 s/p CT guided ablation with hospitalization 2/2 >>> 09/13/17 due to anasarca and Acute on Chronic Respiratory failure s/p thoracentesis Asthma, Fibromyalgia, HLD, HTN, IBS,Splenomegaly, Vitamin D deficiency who returned to the ED 2/11 with recurrent respiratory failure.  CXR showed re-accumulation of a right-sided pleural effusion..       Subjective: 2/14 A/O 4, positive SOB, positive abdominal distention, positive abdominal pain. Negative CP.    Assessment & Plan:   Principal Problem:   Acute on chronic respiratory failure with hypoxemia (HCC) Active Problems:   Anemia   Hepatic cirrhosis (HCC)   Essential hypertension   Chronic respiratory failure (HCC)   Obesity (BMI 30-39.9)   COPD mixed type (HCC)   Anasarca   Hepatocellular carcinoma (HCC)   Ascites   Pleural effusion on right  Acute on Chronic Respiratory Failure with Hypoxia -discharged on 2/8  On 4L O2 via [place] -multifactorial COPD exacerbation, positive RSV pneumonia, HCAP, recurrent RIGHT pleural effusion. -patient's O2 need secondary to RIGHT lung atelectasis and large pleural effusion. -Titrate O2 to maintain SPO2 89-93% -CPAP/BIPAP  QHS per respiratory to maintain SPO2 89-92% -See RSV pneumonia  COPD exacerbation See respiratory failure  OSA/OHS -CPAP per respiratory -ABG. Obtain on room air requirement for Trilogy -Titrate O2 to maintain SPO2 89-93%  RLL pneumonia (HCAP)//positive RSV pneumonia -2/13 PCXR consistent with RLL pneumonia. Although patient negative leukocytosis patient is immunocompromised with HCC/liver failure may not mount immune  response. Given that second hospitalization within 7 days. Start pneumonia protocol. 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 -S/P RIGHT Thoracentesis: 900 ml aspirated.Pathology negative for malignant cells./culture and stain pending -GI recommends possible TIPS once patient's CHF and respiratory status better controlled. Following case and making recommendations.  -Would Place., Pleurx cath if GI decides not to recommend TIPS procedure.considering how fast effusion accumulates would keep patient out of hospital -pathology no malignant cells.  Nonalcoholic cirrhosis /Hepatocellular carcinoma -ammonia level daily -lactulose 10 g daily   Chronic diastolic CHF (base weight 84.5 kg at discharge on 2/8) -Strict in and out since admission +377m -Daily weight Filed Weights   09/17/17 0536 09/18/17 0458 09/19/17 0328  Weight: 197 lb 5 oz (89.5 kg) 191 lb 6.4 oz (86.8 kg) 194 lb 9.6 oz (88.3 kg)  -Lasix 60 mg TID  Macrocytic anemia -B-12/folate pending    Macrocytic anemia Check BZ12and folic acid    CKD stage III (baseline Cr 1.3) -Follow Cr closely with diuretics Recent Labs  Lab 09/16/17 1253 09/17/17 0407 09/18/17 0208  CREATININE 1.33* 1.23* 1.31*   -stable crt 1.3 09/11/17 - follow crt w/ ongoing diuretic use    Obesity -Body mass index is 36.09 kg/m    GOALS of care -PALLIATIVE CARE consult: Hepatocellular carcinoma, recurrent pleural effusion, esophageal varices discuss short-term vs long-term goals of care vs hospice     DVT prophylaxis: SCD Code Status: dO NOT RESUSCITATE Family Communication: husband at bedside for discussion of plan care Disposition Plan: TBD   Consultants:  IR   Procedures/Significant Events:  2/12 S/P RIGHT Thoracentesis 900 ml aspirated     I have personally reviewed and interpreted all radiology studies and my findings are as above.  VENTILATOR SETTINGS:    Cultures 2/11 MRSA by PCR negative 2/12 RIGHT pleural fluid negative malignant cells: Culture pending 2/13 respiratory virus panel pending 2/13 sputum pending 2/13 HIV negative 2/13 strep pneumo pending 2/13 respiratory virus panel positive RSV    Antimicrobials: Anti-infectives (From admission, onward)   Start     Stop   09/19/17 0100  vancomycin (VANCOCIN) IVPB 750 mg/150 ml premix         09/18/17 1230  ceFEPIme (MAXIPIME) 2 g in sodium chloride 0.9 % 100 mL IVPB     09/26/17 1229   09/18/17 1215  vancomycin (VANCOCIN) 1,750 mg in sodium chloride 0.9 % 500 mL IVPB         09/16/17 1700  rifampin (RIFADIN) capsule 300 mg             Devices    LINES / TUBES:      Continuous Infusions: . ceFEPime (MAXIPIME) IV Stopped (09/18/17 1420)  . vancomycin Stopped (09/19/17 0304)     Objective: Vitals:   09/19/17 0328 09/19/17 0711 09/19/17 0838 09/19/17 0840  BP: 121/61 (!) 125/55    Pulse: 90 81    Resp: 18 12    Temp:  98.5 F (36.9 C)    TempSrc:  Oral    SpO2: 95% 96% 95% 94%  Weight: 194 lb 9.6 oz (88.3 kg)     Height:        Intake/Output Summary (Last 24 hours) at 09/19/2017 1034 Last data filed at 09/19/2017 0352 Gross per 24 hour  Intake 250 ml  Output -  Net 250 ml   Filed Weights   09/17/17 0536 09/18/17 0458 09/19/17 0328  Weight: 197 lb 5 oz (89.5 kg) 191 lb 6.4 oz (86.8 kg) 194 lb 9.6 oz (88.3 kg)    Physical Exam:  General: A/O x 4, positive acute on chronic respiratory distress  Neck:  Negative scars, masses, torticollis, lymphadenopathy, JVD Lungs: tachypneic, diffuse expiratory wheeze, negative crackles Cardiovascular: Tachycardic, Regular  rhythm without murmur gallop or rub normal S1 and S2 Abdomen: Obese, positive RUQ abdominal pain to palpation,  positive distention,, positive soft, bowel sounds, no rebound, positive ascites, no appreciable mass Extremities: No significant cyanosis, clubbing, or edema bilateral lower extremities Skin: Negative rashes, lesions, ulcers Psychiatric:  Negative depression, negative anxiety, negative fatigue, negative mania  Central nervous system:  Cranial nerves II through XII intact, tongue/uvula midline, all extremities muscle strength 5/5, sensation intact throughout,  negative dysarthria, negative expressive aphasia, negative receptive aphasia. .     Data Reviewed: Care during the described time interval was provided by me .  I have reviewed this patient's available data, including medical history, events of note, physical examination, and all test results as part of my evaluation.   CBC: Recent Labs  Lab 09/13/17 1208 09/16/17 1253 09/17/17 0407 09/18/17 0208  WBC 6.1 6.3 3.4* 5.5  NEUTROABS  --  4.9  --   --   HGB 8.7* 9.8* 8.3* 9.6*  HCT 28.8* 32.2* 27.8* 31.9*  MCV 105.5* 108.8* 106.9* 106.3*  PLT 123* 140* 112* 829*   Basic Metabolic Panel: Recent Labs  Lab 09/16/17 1253 09/17/17 0407 09/18/17 0208  NA 139 140 139  K 4.0 4.0 3.9  CL 100* 99* 99*  CO2 28 30 28   GLUCOSE 126* 153* 161*  BUN 25*  25* 27*  CREATININE 1.33* 1.23* 1.31*  CALCIUM 8.7* 8.5* 8.6*   GFR: Estimated Creatinine Clearance: 41.3 mL/min (A) (by C-G formula based on SCr of 1.31 mg/dL (H)). Liver Function Tests: Recent Labs  Lab 09/16/17 1253 09/17/17 0407 09/18/17 0208  AST 32 26 33  ALT 28 26 26   ALKPHOS 120 100 116  BILITOT 1.1 1.1 1.2  PROT 5.3* 4.6* 4.8*  ALBUMIN 2.9* 2.5* 2.7*   No results for input(s): LIPASE, AMYLASE in the last 168 hours. Recent Labs  Lab 09/18/17 0804  AMMONIA 21   Coagulation Profile: Recent Labs  Lab 09/17/17 0708  INR 1.23   Cardiac Enzymes: No results for input(s): CKTOTAL, CKMB, CKMBINDEX, TROPONINI in the last 168 hours. BNP (last 3 results) No results  for input(s): PROBNP in the last 8760 hours. HbA1C: No results for input(s): HGBA1C in the last 72 hours. CBG: Recent Labs  Lab 09/12/17 1703 09/12/17 2035 09/13/17 0824 09/13/17 1219 09/13/17 1738  GLUCAP 177* 203* 94 164* 161*   Lipid Profile: No results for input(s): CHOL, HDL, LDLCALC, TRIG, CHOLHDL, LDLDIRECT in the last 72 hours. Thyroid Function Tests: No results for input(s): TSH, T4TOTAL, FREET4, T3FREE, THYROIDAB in the last 72 hours. Anemia Panel: Recent Labs    09/18/17 0208  VITAMINB12 1,465*  FOLATE 12.7  FERRITIN 82  TIBC 281  IRON 31  RETICCTPCT 3.9*   Urine analysis:    Component Value Date/Time   COLORURINE YELLOW 09/16/2017 1922   APPEARANCEUR CLEAR 09/16/2017 1922   LABSPEC 1.008 09/16/2017 [place] 5.0 09/16/2017 [place] NEGATIVE 09/16/2017 [place] NEGATIVE 09/16/2017 [place] NEGATIVE 09/16/2017 [place] NEGATIVE 09/16/2017 [place] NEGATIVE 09/16/2017 1922   UROBILINOGEN 1 01/13/2015 1145   NITRITE NEGATIVE 09/16/2017 1922   LEUKOCYTESUR TRACE (A) 09/16/2017 1922   Sepsis Labs: @LABRCNTIP (procalcitonin:4,lacticidven:4)  ) Recent Results (from the past 240 hour(s))  Gram stain     Status: None   Collection Time: 09/12/17 11:54 AM  Result Value Ref Range Status   Specimen Description PLEURAL RIGHT  Final   Special Requests NONE  Final   Gram Stain   Final    RARE WBC PRESENT,BOTH PMN AND MONONUCLEAR NO ORGANISMS SEEN Performed at [facility], [place] [address], [place], [place] [address]    Report Status 09/13/2017 FINAL  Final  Culture, body fluid-bottle     Status: None   Collection Time: 09/12/17 11:54 AM  Result Value Ref Range Status   Specimen Description PLEURAL RIGHT  Final   Special Requests NONE  Final   Culture   Final    NO GROWTH 5 DAYS Performed at [place] [address] [place], [place] [address]    Report Status 09/17/2017 FINAL  Final  MRSA PCR  Screening     Status: None   Collection Time: 09/16/17 11:37 PM  Result Value Ref Range Status   MRSA by PCR NEGATIVE NEGATIVE Final    Comment:        The GeneXpert MRSA Assay (FDA approved for NASAL specimens only), is one component of a comprehensive MRSA colonization surveillance program. It is not intended to diagnose MRSA infection nor to guide or monitor treatment for MRSA infections. Performed at [facility], [place] [address] [place], [place] [address]   Body fluid culture     Status: None (Preliminary result)   Collection Time: 09/17/17  1:00 PM  Result Value Ref Range Status   Specimen Description PLEURAL  Final   Special Requests LEFT  Final   Gram Stain   Final    RARE WBC PRESENT, PREDOMINANTLY PMN NO ORGANISMS SEEN Performed at [facility], [place] [address] [place], [place] [address]    Culture PENDING  Incomplete   Report Status PENDING  Incomplete  Respiratory Panel by PCR     Status: Abnormal   Collection Time: 09/18/17  2:08 PM  Result Value Ref Range Status   Adenovirus NOT DETECTED NOT DETECTED Final   Coronavirus 229E NOT DETECTED NOT DETECTED Final   Coronavirus HKU1 NOT DETECTED NOT DETECTED Final   Coronavirus NL63 NOT DETECTED NOT DETECTED Final   Coronavirus OC43 NOT DETECTED NOT DETECTED Final   Metapneumovirus NOT DETECTED NOT DETECTED Final   Rhinovirus / Enterovirus NOT DETECTED NOT DETECTED Final   Influenza A NOT DETECTED NOT DETECTED Final   Influenza B NOT DETECTED NOT DETECTED Final   Parainfluenza Virus 1 NOT DETECTED NOT DETECTED Final   Parainfluenza Virus 2 NOT DETECTED NOT DETECTED Final   Parainfluenza Virus 3 NOT DETECTED NOT DETECTED Final   Parainfluenza Virus 4 NOT DETECTED NOT DETECTED Final   Respiratory Syncytial Virus DETECTED (A) NOT DETECTED Final    Comment: CRITICAL RESULT CALLED TO, READ BACK BY AND VERIFIED WITH: HS Lee RN 17:25 09/18/17 ([email or web address])    Bordetella pertussis NOT DETECTED NOT DETECTED Final     Chlamydophila pneumoniae NOT DETECTED NOT DETECTED Final   Mycoplasma pneumoniae NOT DETECTED NOT DETECTED Final    Comment: Performed at [facility], [address], [place], [place] [address]         Radiology Studies: Dg Chest 1 View  Result Date: 09/17/2017 CLINICAL DATA:  Status post left-sided thoracentesis. History of asthma-COPD, former smoker. EXAM: CHEST 1 VIEW COMPARISON:  Portable chest x-ray of September 16, 2017 FINDINGS: Aeration of the right lung has improved since the thoracentesis. A small amount of pleural fluid persists. There is no postprocedure pneumothorax. The left lung remains well expanded. There is patchy density at the left base. The cardiac silhouette is top-normal in size. The central pulmonary vascularity is mildly prominent. There is calcification in the wall of the aortic arch. IMPRESSION: No postprocedure complication following right-sided thoracentesis. Small right pleural effusion remains. Underlying COPD.  Left basilar atelectasis or infiltrate. Thoracic aortic atherosclerosis. Electronically Signed   By: David  Martinique M.D.   On: 09/17/2017 13:12   Dg Chest Port 1 View  Result Date: 09/18/2017 CLINICAL DATA:  Pleural effusion follow-up EXAM: PORTABLE CHEST 1 VIEW COMPARISON:  09/17/2017, 09/16/2017 FINDINGS: Right lower lobe infiltrate has progressed. Improvement in right effusion. Minimal left lower lobe airspace disease unchanged. Negative for heart failure. No effusion on the left. IMPRESSION: Progression of right lower lobe infiltrate, probable pneumonia. Improvement in right pleural effusion. Electronically Signed   By: Franchot Gallo M.D.   On: 09/18/2017 08:11   Ir Abdomen US Limited  Result Date: 09/17/2017 CLINICAL DATA:  72 year old with cirrhosis and hepatocellular carcinoma. Evaluate for a therapeutic paracentesis. EXAM: LIMITED ABDOMEN ULTRASOUND FOR ASCITES TECHNIQUE: Limited ultrasound survey for ascites was performed in all four  abdominal quadrants. COMPARISON:  09/13/2017 FINDINGS: Again noted is a small amount of ascites.  No large fluid pockets. IMPRESSION: Small amount of ascites.  Therapeutic paracentesis not performed. Electronically Signed   By: Markus Daft M.D.   On: 09/17/2017 14:34   Ir Thoracentesis Asp Pleural Space  W/img Guide  Result Date: 09/17/2017 INDICATION: Patient with recurrent right pleural effusion. Request is made for diagnostic and therapeutic thoracentesis. EXAM: ULTRASOUND GUIDED DIAGNOSTIC AND THERAPEUTIC RIGHT THORACENTESIS MEDICATIONS: 10 mL 2% lidocaine COMPLICATIONS: None immediate. PROCEDURE: An ultrasound guided thoracentesis was thoroughly discussed with the patient and questions answered. The benefits, risks, alternatives and complications were also discussed. The patient understands and wishes to proceed with the procedure. Written consent was obtained. Ultrasound was performed to localize and mark an adequate pocket of fluid in the right chest. The area was then prepped and draped in the normal sterile fashion. 2% Lidocaine was used for local anesthesia. Under ultrasound guidance a Safe-T-Centesis catheter was introduced. Thoracentesis was performed. The catheter was removed and a dressing applied. FINDINGS: A total of approximately 900 mL of yellow, cloudy fluid was removed. Samples were sent to the laboratory as requested by the clinical team. IMPRESSION: Successful ultrasound guided diagnostic and therapeutic right thoracentesis yielding 900 mL of pleural fluid. Read by: Brynda Greathouse PA-C Electronically Signed   By: Markus Daft M.D.   On: 09/17/2017 14:00        Scheduled Meds: . bisoprolol  10 mg Oral Q supper  . budesonide  0.25 mg Nebulization BID  . chlorhexidine  15 mL Mouth Rinse BID  . citalopram  40 mg Oral Q supper  . furosemide  60 mg Intravenous BID  . gabapentin  600 mg Oral TID  . ipratropium-albuterol  3 mL Nebulization Q6H  . lactulose  10 g Oral Daily  . mouth  rinse  15 mL Mouth Rinse q12n4p  . methylPREDNISolone (SOLU-MEDROL) injection  40 mg Intravenous Q12H  . potassium chloride  30 mEq Oral Daily  . potassium chloride  20 mEq Oral 2 times per day  . rifampin  300 mg Oral BID WC  . spironolactone  100 mg Oral Daily   Continuous Infusions: . ceFEPime (MAXIPIME) IV Stopped (09/18/17 1420)  . vancomycin Stopped (09/19/17 0304)     LOS: 3 days    Time spent: 40 minutes    Tajai Suder, Geraldo Docker, MD [facility] Pager [phone]   If 7PM-7AM, please contact night-coverage [email or web address] Password [facility] 09/19/2017, 10:34 AM

## 2017-09-19 NOTE — Progress Notes (Signed)
Palliative GOC meeting with patient and wife. DNR/DNI. Otherwise, full scope treatment and continue current medical management. MOST form completed. Full palliative note to follow.   NO CHARGE  Ihor Dow, FNP-C Palliative Medicine Team  Phone: [phone] Fax: [phone]

## 2017-09-19 NOTE — Progress Notes (Signed)
CRITICAL VALUE ALERT  Critical Value:  Lactic acid 2.1  Date & Time Notied: 1555  Provider Notified: Dr. Sherral Hammers  Orders Received/Actions taken: awaiting ordered

## 2017-09-19 NOTE — Consult Note (Signed)
Consultation Note Date: 09/19/17  Patient Name: [name]  DOB: 10-18-1945  MRN: [id number]  Age / Sex: 72 y.o., female  PCP: Unk Pinto, MD Referring Physician: Cherene Altes, MD  Reason for Consultation: Establishing goals of care  HPI/Patient Profile: 72 y.o. female  with past medical history of COPD on home oxygen, recurrent pleural effusions, non-alcoholic related liver cirrhosis, hepatocellular carcinoma diagnosed November 2018, hepatic encephalopathy, esophageal varices, fibromyalgia, HLD, HTN, IBS, splenomegaly, vitamin D deficiency, and depression admitted on 09/16/2017 with shortness of breath and abdominal distention. Recent hospitalization 2/2-2/8 for respiratory failure, recurrent pleural effusion s/p thoracentesis, and anasarca. 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 Thoracentesis on 2/12 with 900cc removed. Acute on chronic respiratory failure with underlying COPD on home oxygen. Also possible OSA using prn CPAP/BiPAP HS. CXR on 2/13 consistent with RLL pneumonia. Started on IV antibiotics. Also receiving nebs and steroids. GI consult-consider TIPS versus pleurx. Also with chronic diastolic CHF. Palliative medicine consultation for goals of care/short versus long-term GOC.  Clinical Assessment and Goals of Care: I have reviewed medical records, discussed with care team, and met with patient and husband [name]) at bedside to discuss diagnosis, prognosis, GOC, EOL wishes, disposition and options.  Introduced Palliative Medicine as specialized medical care for people living with serious illness. It focuses on providing relief from the symptoms and stress of a serious illness. The goal is to improve quality of life for both the patient and the family.  We discussed a brief life review of the patient. Married to [place] for 40 years. They have children  from previous marriages and multiple great grandchildren. Prior to hospitalization, living home with [name] as primary caregiver. She requires assist with ADL's. She experiences intermittent dyspnea with exertion. COPD on and home oxygen for 3+ years. Ongoing liver cirrhosis and diagnosed with hepatocellular carcinoma a few months ago. Liver ablation was on 1/25 and they plan to follow up with outpatient oncology for further blood tests and scans.   Discussed hospital diagnoses, interventions, and underlying co-morbidities. Patient and husband have a good understanding of acute on chronic respiratory failure secondary to COPD, pneumonia, and CHF/fluid overload. They are willing to pursue TIPS or pleurx placement if recommended by GI after aggressive diuresis.   Advanced directives, concepts specific to code status, artifical feeding and hydration, and rehospitalization were considered and discussed. Ms. Goldberger does not have a documented living will but interested in completed AD packet during hospitalization. Educated on AD packet. Ms. Ground clearly tells her husband and I that she does NOT want resuscitation attempted if her heart stopped. She does not want to be placed on a ventilator understanding she is "frail" and in fear of her family having to make decisions to take her off of life support.   Introduced and completed MOST form with patient and her husband. After discussion, patient wishes for DNR/DNI, limited interventions including CPAP/BiPAP if indicated, antibiotics/IVF if indicated, and no feeding tube.   I attempted to elicit values and goals  of care important to the patient. Ms. Rayo is hopeful to return home and "function." Quality of life is important to her. She fears being a burden on her family. She is hopeful to live to see the birth of another great grandchild in May. She is interested in further follow-up with oncology.   Questions and concerns were addressed. PMT contact information  given. Emotional/spiritual support provided.    SUMMARY OF RECOMMENDATIONS    MOST form completed with patient and husband. DNR/DNI, limited interventions, ABX/IVF if indicated, and no feeding tube.   Continue current medical management including CPAP/BiPAP prn.   Spiritual consult to complete AD packet with patient and her husband.   Patient/husband hopeful for improvement and return home. She plans to continue f/u with outpatient oncology.   PMT will follow.   Code Status/Advance Care Planning:  DNR  Symptom Management:   Per attending  Palliative Prophylaxis:   Bowel Regimen, Delirium Protocol and Frequent Pain Assessment  Psycho-social/Spiritual:   Desire for further Chaplaincy support: yes  Additional Recommendations: Caregiving  Support/Resources and Education on Hospice  Prognosis:   Unable to determine  Discharge Planning: To Be Determined      Primary Diagnoses: Present on Admission: . Acute on chronic respiratory failure with hypoxemia ([place]) . Anasarca . Anemia . Ascites . Chronic respiratory failure ([place]) . Essential hypertension . Hepatocellular carcinoma ([place]) . Obesity (BMI 30-39.9) . COPD mixed type ([place]) . Hepatic cirrhosis ([place])   I have reviewed the medical record, interviewed the patient and family, and examined the patient. The following aspects are pertinent.  Past Medical History:  Diagnosis Date  . Asthma   . Cancer [facility])    liver cancer   . COPD (chronic obstructive pulmonary disease) ([place])    x 3 years   . Depression   . Diabetes mellitus without complication ([place])    diet controlled  not on meds   . Edema    left leg   . Esophageal varices ([place])   . Fibromyalgia   . Gastritis   . GERD (gastroesophageal reflux disease)   . Hepatic cirrhosis ([place])   . Hepatic encephalopathy ([place])   . History of blood transfusion   . Hyperlipidemia   . Hypertension   . IBS (irritable bowel syndrome)   . Phlebitis    left leg x 2     . Pneumonia    hx of   . Splenomegaly   . Vitamin D deficiency    Social History   Socioeconomic History  . Marital status: Married    Spouse name: [name]  . Number of children: 5  . Years of education: None  . Highest education level: None  Social Needs  . Financial resource strain: None  . Food insecurity - worry: None  . Food insecurity - inability: None  . Transportation needs - medical: None  . Transportation needs - non-medical: None  Occupational History  . Occupation: Arboriculturist: UNEMPLOYED  Tobacco Use  . Smoking status: Former Smoker    Packs/day: 1.00    Years: 50.00    Pack years: 50.00    Types: Cigarettes    Last attempt to quit: 11/05/2014    Years since quitting: 2.8  . Smokeless tobacco: Never Used  . Tobacco comment: Quit April 2016  Substance and Sexual Activity  . Alcohol use: No    Alcohol/week: 0.0 oz  . Drug use: No  . Sexual activity: None  Other Topics Concern  .  None  Social History Narrative  . None   Family History  Problem Relation Age of Onset  . Diabetes Brother        deceased  . Heart disease Sister        A Fib  . Heart disease Mother        CHF  . Atrial fibrillation Sister   . Cancer Father        lung cancer   . Cancer Maternal Uncle        unknown type cancer   . Cancer Other        lung cancer   . Colon cancer Neg Hx    Scheduled Meds: . acetylcysteine  4 mL Nebulization BID  . bisoprolol  10 mg Oral Q supper  . budesonide  0.25 mg Nebulization BID  . chlorhexidine  15 mL Mouth Rinse BID  . citalopram  20 mg Oral Q supper  . furosemide  60 mg Intravenous TID  . gabapentin  400 mg Oral TID  . ipratropium-albuterol  3 mL Nebulization Q6H  . lactulose  10 g Oral Daily  . mouth rinse  15 mL Mouth Rinse q12n4p  . methylPREDNISolone (SOLU-MEDROL) injection  40 mg Intravenous Q12H  . potassium chloride  30 mEq Oral Daily  . potassium chloride  20 mEq Oral 2 times per day  . rifampin  300 mg Oral BID WC   . sodium chloride flush  10-40 mL Intracatheter Q12H  . spironolactone  100 mg Oral Daily   Continuous Infusions: . ceFEPime (MAXIPIME) IV Stopped (09/19/17 1309)   PRN Meds:.acetaminophen, albuterol, cyclobenzaprine, guaiFENesin-dextromethorphan, lidocaine, ondansetron **OR** ondansetron (ZOFRAN) IV, oxyCODONE, sodium chloride flush, traMADol Medications Prior to Admission:  Prior to Admission medications   Medication Sig Start Date End Date Taking? Authorizing Provider  albuterol (PROVENTIL HFA;VENTOLIN HFA) 108 (90 Base) MCG/ACT inhaler Inhale 2 puffs into the lungs every 6 (six) hours as needed for wheezing or shortness of breath. 05/27/16  Yes Ward, Delice Bison, DO  allopurinol (ZYLOPRIM) 300 MG tablet take 1/2-1 tablet by mouth daily for gout Patient taking differently: take 1 tablet (300 mg) by mouth daily with supper - for gout 04/29/17  Yes Unk Pinto, MD  aspirin EC 81 MG tablet Take 81 mg by mouth at bedtime.   Yes [provider]  bisoprolol (ZEBETA) 10 MG tablet Take 1 tablet (10 mg total) by mouth daily. Patient taking differently: Take 10 mg by mouth daily with supper.  04/09/17  Yes Vicie Mutters, PA-C  budesonide (PULMICORT) 0.25 MG/2ML nebulizer solution Take 2 mLs (0.25 mg total) by nebulization 2 (two) times daily. 09/13/17  Yes Oretha Milch D, MD  Cholecalciferol (VITAMIN D3) 5000 units CAPS Take 5,000 Units by mouth daily with breakfast.   Yes [provider]  citalopram (CELEXA) 40 MG tablet Take 1 tablet (40 mg total) by mouth 2 (two) times daily. Patient taking differently: Take 40 mg by mouth daily with supper.  06/18/17  Yes Vicie Mutters, PA-C  cyclobenzaprine (FLEXERIL) 10 MG tablet Take 1 tablet (10 mg total) by mouth 3 (three) times daily as needed for muscle spasms. 03/06/16  Yes Unk Pinto, MD  dextromethorphan-guaiFENesin [facility] DM) 30-600 MG 12hr tablet Take 1 tablet by mouth 2 (two) times daily as needed for cough. 09/13/17  Yes  Oretha Milch D, MD  ferrous sulfate 325 (65 FE) MG tablet Take 325 mg by mouth daily.    Yes [provider]  gabapentin (NEURONTIN) 600 MG  tablet Take 1/2 to 1 tablet 3 times a day for neuropathy pain in legs Patient taking differently: Take 600 mg by mouth 3 (three) times daily. for neuropathy pain in legs 06/24/17  Yes Vicie Mutters, PA-C  ipratropium (ATROVENT) 0.02 % nebulizer solution use 1 vial in nebulizer EVERY 4 HOURS AS NEEDED FOR WHEEZING OR SHORTNESS OF BREATH 75/15=5 08/09/17  Yes Unk Pinto, MD  lactulose (CHRONULAC) 10 GM/15ML solution take 63ms TWICE DAILY Patient taking differently: Take 10 g by mouth daily.  08/08/17  Yes MUnk Pinto MD  Magnesium 250 MG TABS Take 250 mg by mouth at bedtime.    Yes [provider]  metolazone (ZAROXOLYN) 5 MG tablet Take 1 tablet (5 mg total) by mouth once a week. Take once a week with lasix or as directed for fluid overload Patient taking differently: Take 2.5 mg by mouth See admin instructions. Take 1/2 tablet (2.5 mg) by mouth daily as needed for weight gain of 2-3 lbs overnight 09/04/17  Yes MUnk Pinto MD  NONFORMULARY OR COMPOUNDED ITEM Apply 1 application topically See admin instructions. SALICYLIC ACID ACID (PETROLATUM) 50% OINTMENT-APPLY TO AFFECTED AREA AT BEDTIME PRN DRY SKIN/LEGS ([place]   Yes [provider]  nystatin cream (MYCOSTATIN) Apply 1 application topically 2 (two) times daily. Patient taking differently: Apply 1 application topically 2 (two) times daily as needed for dry skin (itching).  12/20/16  Yes CVicie Mutters PA-C  ondansetron (ZOFRAN) 4 MG tablet Take 1 tablet (4 mg total) by mouth every 6 (six) hours as needed for nausea or vomiting. 09/02/17  Yes CVicie Mutters PA-C  OXYGEN Inhale 2-3 L into the lungs continuous. 3 L with exertion   Yes [provider]  pantoprazole (PROTONIX) 40 MG tablet Take 1 tablet (40 mg total) by mouth  daily. Patient taking differently: Take 40 mg by mouth daily with supper.  04/22/17  Yes MUnk Pinto MD  potassium chloride (K-DUR,KLOR-CON) 10 MEQ tablet Take 3 tablets (30 mEq total) by mouth 3 (three) times daily. Patient taking differently: Take 20-30 mEq by mouth See admin instructions. Take 3 tablets (30 meq) by mouth every morning, take 2 tablets (20 meq) mid afternoon and at bedtime 01/07/17  Yes CVicie Mutters PA-C  rifampin (RIFADIN) 300 MG capsule Take 1 capsule 2 x / day for Liver Patient taking differently: Take 300 mg by mouth 2 (two) times daily with a meal. for Liver 07/03/17 01/31/18 Yes MUnk Pinto MD  terconazole (TERAZOL 7) 0.4 % vaginal cream Place 1 applicator vaginally at bedtime. Patient taking differently: Place 1 applicator vaginally at bedtime as needed (itching).  12/20/16  Yes CVicie Mutters PA-C  torsemide (DEMADEX) 10 MG tablet Take 3 tablets (30 mg total) by mouth every morning. 09/13/17 10/13/17 Yes NOretha MilchD, MD  torsemide (DEMADEX) 20 MG tablet Take 3 tablets (60 mg total) by mouth every evening. 09/14/17 10/14/17 Yes NDesiree Hane MD  traMADol (ULTRAM) 50 MG tablet Take 50 mg by mouth every 8 hours as needed for pain Patient taking differently: Take 50 mg by mouth every 8 (eight) hours as needed (pain).  05/07/17  Yes CVicie Mutters PA-C  triamcinolone cream (KENALOG) 0.1 % Apply 1 application topically 3 (three) times daily as needed for pain Patient taking differently: Apply 1 application topically 3 (three) times daily as needed (itching).  12/20/16  Yes CVicie Mutters PA-C  glucose blood test strip Take sugars once daily 01/21/17   CVicie Mutters PA-C  Allergies  Allergen Reactions  . Atorvastatin Other (See Comments)    Unknown reaction  . Diphenhydramine Hcl (Sleep) Hives  . Hydrocodone-Acetaminophen Other (See Comments)    Unknown reaction  . Lopid [Gemfibrozil] Other (See Comments)    Unknown reaction  . Loratadine Hives  .  Lorazepam Hives  . Simvastatin Other (See Comments)    Unknown reaction  . Sulfamethoxazole Hives  . Sulfonamide Derivatives Hives   Review of Systems  Constitutional: Positive for activity change and fatigue.  Respiratory: Positive for cough, shortness of breath and wheezing.   Neurological: Positive for weakness.   Physical Exam  Constitutional: She is oriented to person, place, and time. She is cooperative. She appears ill.  HENT:  Head: Normocephalic and atraumatic.  Cardiovascular: Tachycardia present.  Pulmonary/Chest: She has decreased breath sounds. She has wheezes.  Dyspnea at rest  Neurological: She is alert and oriented to person, place, and time.  Skin: Skin is warm and dry.  Psychiatric: She has a normal mood and affect. Her speech is normal and behavior is normal. Cognition and memory are normal.  Nursing note and vitals reviewed.   Vital Signs: BP (!) 133/52 (BP Location: Left Arm)   Pulse 77   Temp 97.8 F (36.6 C) (Oral)   Resp 16   Ht 5' 2"  (1.575 m)   Wt 88.3 kg (194 lb 9.6 oz)   SpO2 100%   BMI 35.59 kg/m  Pain Assessment: No/denies pain   Pain Score: 0-No pain   SpO2: SpO2: 100 % O2 Device:SpO2: 100 % O2 Flow Rate: .O2 Flow Rate (L/min): 8 L/min  IO: Intake/output summary:   Intake/Output Summary (Last 24 hours) at 09/20/2017 2031 Last data filed at 09/20/2017 0700 Gross per 24 hour  Intake 315 ml  Output 1225 ml  Net -910 ml    LBM: Last BM Date: 09/20/17 Baseline Weight: Weight: 86.2 kg (190 lb) Most recent weight: Weight: 88.3 kg (194 lb 9.6 oz)     Palliative Assessment/Data: PPS 50%   Flowsheet Rows     Most Recent Value  Intake Tab  Referral Department  Hospitalist  Unit at Time of Referral  Intermediate Care Unit  Palliative Care Primary Diagnosis  -- [COPD, recurrent pleural effusion, cirrhosis, hepatocellular carcinoma]  Palliative Care Type  New Palliative care  Reason for referral  Clarify Goals of Care  Date first seen  by Palliative Care  09/19/17  Clinical Assessment  Palliative Performance Scale Score  50%  Psychosocial & Spiritual Assessment  Palliative Care Outcomes  Patient/Family meeting held?  Yes  Who was at the meeting?  patient and husband  Palliative Care Outcomes  Clarified goals of care, Provided end of life care assistance, Provided psychosocial or spiritual support, ACP counseling assistance, Counseled regarding hospice      Time In: 1440 Time Out: 1600 Time Total: 70mn Greater than 50%  of this time was spent counseling and coordinating care related to the above assessment and plan.  Signed by:  MIhor Dow FNP-C Palliative Medicine Team  Phone: [phone]: [phone]  Please contact Palliative Medicine Team phone at [phone] questions and concerns.  For individual provider: See AShea Evans

## 2017-09-19 NOTE — Care Management Note (Addendum)
Case Management Note  Patient Details  Name: MCKENSIE SCOTTI MRN: [id number] Date of Birth: May 31, 1946  Subjective/Objective:   Pt admitted with pleural effusion    Action/Plan:  Patient is from home w spouse. Pt already has neb, RW, electric scooter, and O2 (3Liters) through [facility].    Pt is already active with [facility] for  [facility] and HHPT - agency aware of admit. CM will need to ensure that at time of discharge pt has portable oxygen tank in addition to ensuring resumption for [facility] orders are written.    Expected Discharge Date:                  Expected Discharge Plan:     In-House Referral:     Discharge planning Services     Post Acute Care Choice:    Choice offered to:     DME Arranged:    DME Agency:     HH Arranged:    HH Agency:     Status of Service:     If discussed at [employer], dates discussed:    Additional Comments: 09/20/2017 If pt is to discharge home would benefit from [facility] program or possibly [facility] if approrpiate [name], RN 09/19/2017, 3:08 PM

## 2017-09-20 DIAGNOSIS — Z515 Encounter for palliative care: Secondary | ICD-10-CM

## 2017-09-20 LAB — CBC
HEMATOCRIT: 29.8 % — AB (ref 36.0–46.0)
Hemoglobin: 8.9 g/dL — ABNORMAL LOW (ref 12.0–15.0)
MCH: 32.2 pg (ref 26.0–34.0)
MCHC: 29.9 g/dL — AB (ref 30.0–36.0)
MCV: 108 fL — AB (ref 78.0–100.0)
PLATELETS: 89 10*3/uL — AB (ref 150–400)
RBC: 2.76 MIL/uL — ABNORMAL LOW (ref 3.87–5.11)
RDW: 16.3 % — AB (ref 11.5–15.5)
WBC: 3.9 10*3/uL — ABNORMAL LOW (ref 4.0–10.5)

## 2017-09-20 LAB — BASIC METABOLIC PANEL
Anion gap: 10 (ref 5–15)
BUN: 21 mg/dL — AB (ref 6–20)
CHLORIDE: 101 mmol/L (ref 101–111)
CO2: 30 mmol/L (ref 22–32)
CREATININE: 1.14 mg/dL — AB (ref 0.44–1.00)
Calcium: 8.5 mg/dL — ABNORMAL LOW (ref 8.9–10.3)
GFR calc Af Amer: 55 mL/min — ABNORMAL LOW (ref >60)
GFR calc non Af Amer: 48 mL/min — ABNORMAL LOW (ref >60)
GLUCOSE: 153 mg/dL — AB (ref 65–99)
POTASSIUM: 4.5 mmol/L (ref 3.5–5.1)
SODIUM: 141 mmol/L (ref 135–145)

## 2017-09-20 LAB — MAGNESIUM: Magnesium: 2 mg/dL (ref 1.7–2.4)

## 2017-09-20 MED ORDER — GABAPENTIN 800 MG PO TABS
400.0000 mg | ORAL_TABLET | Freq: Three times a day (TID) | ORAL | Status: DC
  Filled 2017-09-20: qty 0.5

## 2017-09-20 MED ORDER — GABAPENTIN 400 MG PO CAPS
400.0000 mg | ORAL_CAPSULE | Freq: Three times a day (TID) | ORAL | Status: DC
  Administered 2017-09-20 – 2017-09-28 (×24): 400 mg via ORAL
  Filled 2017-09-20 (×25): qty 1

## 2017-09-20 MED ORDER — CITALOPRAM HYDROBROMIDE 20 MG PO TABS
20.0000 mg | ORAL_TABLET | Freq: Every day | ORAL | Status: DC
  Administered 2017-09-20 – 2017-09-27 (×8): 20 mg via ORAL
  Filled 2017-09-20 (×10): qty 1

## 2017-09-20 MED ORDER — SODIUM CHLORIDE 0.9% FLUSH
10.0000 mL | Freq: Two times a day (BID) | INTRAVENOUS | Status: DC
  Administered 2017-09-20 – 2017-09-27 (×13): 10 mL

## 2017-09-20 MED ORDER — SODIUM CHLORIDE 0.9% FLUSH: 10.0000 mL | INTRAVENOUS | Status: DC | PRN

## 2017-09-20 NOTE — Progress Notes (Signed)
[place] TEAM 1 - Stepdown/ICU TEAM  [name]  FFM:[id number] DOB: October 23, 1945 DOA: 09/16/2017 PCP: Unk Pinto, MD    Brief Narrative:  72 y.o. female with a hx of non-alcohol related liver cirrhosis, COPD, and hepatocellular carcinoma diagnosed Nov 2018 s/p CT guided ablation with hospitalization 2/2 > 09/13/17 due to anasarca and acute on chronic respiratory failure s/p thoracentesis who returned to the ED 2/11 with recurrent respiratory failure.  CXR showed re-accumulation of a right-sided pleural effusion..    Significant Events: 2/8 d/c  2/11 re-admit w/ rapidly re-accumulated pleural effusion  2/12 thoracentesis in IR   Subjective: The patient is sitting up in a bedside commode.  She states she feels much better in general.  She denies chest pain nausea vomiting or significant shortness of breath at this time.  She reports that her appetite is improving.  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 S/p thoracentesis 09/12/17 (1.5L) - rapidly re-accumulated - repeat thora 2/12 - following w/ upward titration of diuretics - GI following to assist in determining of TIPS has a role   RLL Pneumonia - RSV Pneumonitis  Now on empiric abx course - cont supportive care - appears to be improving   Nonalcoholic cirrhosis - hepatocellular carcinoma GI following along w/ her care - alert and oriented   Grade 2 Chronic diastolic CHF  wgt was 57.0 at time of d/c 2/8 - does appear volume overloaded at this time w/ signif increase in peripheral edema - adjust diuretic and follow  Filed Weights   09/17/17 0536 09/18/17 0458 09/19/17 0328  Weight: 89.5 kg (197 lb 5 oz) 86.8 kg (191 lb 6.4 oz) 88.3 kg (194 lb 9.6 oz)     COPD Much better compensated at this time - no wheezing on exam today   Macrocytic anemia V77 and folic acid normal/high - due to cirrhosis   CKD crt 1.3 09/11/17 - follow crt w/ ongoing diuretic use - stable at this time   Recent Labs  Lab 09/16/17 1253 09/17/17 0407 09/18/17 0208 09/19/17 1420 09/20/17 0406  CREATININE 1.33* 1.23* 1.31* 1.24* 1.14*    OHS - SA  Obesity - Body mass index is 35.59 kg/m.   DVT prophylaxis: SCDs Code Status: DNR - NO CODE Family Communication: no family present at time of exam today  Disposition Plan: SDU  Consultants:  IR GI  Antimicrobials:  Vanc 2/13 > Cefepime 2/13 >  Objective: Blood pressure (!) 132/59, pulse 79, temperature 98 F (36.7 C), temperature source Oral, resp. rate 13, height 5' 2"  (1.575 m), weight 88.3 kg (194 lb 9.6 oz), SpO2 100 %.  Intake/Output Summary (Last 24 hours) at 09/20/2017 0940 Last data filed at 09/20/2017 0700 Gross per 24 hour  Intake 465 ml  Output 1225 ml  Net -760 ml   Filed Weights   09/17/17 0536 09/18/17 0458 09/19/17 0328  Weight: 89.5 kg (197 lb 5 oz) 86.8 kg (191 lb 6.4 oz) 88.3 kg (194 lb 9.6 oz)    Examination: General: no acute distress - alert and conversant Lungs: no active wheeze - improved air movement th/o all fields except blunting in R base Cardiovascular: RRR w/o M or rub  Abdomen: Nontender, obese, soft,  bowel sounds positive, no rebound  Extremities: 2+ edema bilateral lower extremities  CBC: Recent Labs  Lab 09/16/17 1253 09/17/17 0407 09/18/17 0208 09/19/17 1420 09/20/17 0406  WBC 6.3 3.4* 5.5 5.9 3.9*  NEUTROABS 4.9  --   --  5.0  --   HGB 9.8* 8.3* 9.6* 9.3* 8.9*  HCT 32.2* 27.8* 31.9* 30.8* 29.8*  MCV 108.8* 106.9* 106.3* 108.8* 108.0*  PLT 140* 112* 136* 104* 89*   Basic Metabolic Panel: Recent Labs  Lab 09/16/17 1253 09/17/17 0407 09/18/17 0208 09/19/17 1420 09/20/17 0406  NA 139 140 139 141 141  K 4.0 4.0 3.9 4.1 4.5  CL 100* 99* 99*  100* 101  CO2 28 30 28 30 30   GLUCOSE 126* 153* 161* 178* 153*  BUN 25* 25* 27* 21* 21*  CREATININE 1.33* 1.23* 1.31* 1.24* 1.14*  CALCIUM 8.7* 8.5* 8.6* 8.5* 8.5*  MG  --   --   --  2.0 2.0   GFR: Estimated Creatinine Clearance: 47.4 mL/min (A) (by C-G formula based on SCr of 1.14 mg/dL (H)).  Liver Function Tests: Recent Labs  Lab 09/16/17 1253 09/17/17 0407 09/18/17 0208 09/19/17 1420  AST 32 26 33 35  ALT 28 26 26 26   ALKPHOS 120 100 116 114  BILITOT 1.1 1.1 1.2 1.5*  PROT 5.3* 4.6* 4.8* 5.1*  ALBUMIN 2.9* 2.5* 2.7* 2.8*    Coagulation Profile: Recent Labs  Lab 09/17/17 0708  INR 1.23   HbA1C: Hgb A1c MFr Bld  Date/Time Value Ref Range Status  09/03/2017 03:47 PM 5.1 <5.7 % of total Hgb Final    Comment:    For the purpose of screening for the presence of diabetes: . <5.7%       Consistent with the absence of diabetes 5.7-6.4%    Consistent with increased risk for diabetes             (prediabetes) > or =6.5%  Consistent with diabetes . This assay result is consistent with a decreased risk of diabetes. . Currently, no consensus exists regarding use of hemoglobin A1c for diagnosis of diabetes in children. . According to American Diabetes Association (ADA) guidelines, hemoglobin A1c <7.0% represents optimal control in non-pregnant diabetic patients. Different metrics may apply to specific patient populations.  Standards of Medical Care in Diabetes(ADA). Marland Kitchen   08/27/2017 02:57 PM 5.1 4.8 - 5.6 % Final    Comment:    (NOTE) Pre diabetes:          5.7%-6.4% Diabetes:              >6.4% Glycemic control for   <7.0% adults with diabetes     CBG: Recent Labs  Lab 09/13/17 1219 09/13/17 1738  GLUCAP 164* 161*    Recent Results (from the past 240 hour(s))  Gram stain     Status: None   Collection Time: 09/12/17 11:54 AM  Result Value Ref Range Status   Specimen Description PLEURAL RIGHT  Final   Special Requests NONE  Final   Gram Stain    Final    RARE WBC PRESENT,BOTH PMN AND MONONUCLEAR NO ORGANISMS SEEN Performed at [facility], [address] [place], [place] [address]    Report Status 09/13/2017 FINAL  Final  Culture, body fluid-bottle     Status: None   Collection Time: 09/12/17 11:54 AM  Result Value Ref Range Status   Specimen Description PLEURAL RIGHT  Final   Special Requests NONE  Final   Culture  Final    NO GROWTH 5 DAYS Performed at [facility], [place] [address] [place], [place] [address]    Report Status 09/17/2017 FINAL  Final  MRSA PCR Screening     Status: None   Collection Time: 09/16/17 11:37 PM  Result Value Ref Range Status   MRSA by PCR NEGATIVE NEGATIVE Final    Comment:        The GeneXpert MRSA Assay (FDA approved for NASAL specimens only), is one component of a comprehensive MRSA colonization surveillance program. It is not intended to diagnose MRSA infection nor to guide or monitor treatment for MRSA infections. Performed at [facility], [place] [address] [place], [place] [address]   Body fluid culture     Status: None (Preliminary result)   Collection Time: 09/17/17  1:00 PM  Result Value Ref Range Status   Specimen Description PLEURAL  Final   Special Requests LEFT  Final   Gram Stain   Final    RARE WBC PRESENT, PREDOMINANTLY PMN NO ORGANISMS SEEN    Culture   Final    NO GROWTH 2 DAYS Performed at [facility] [address] [place], [place] [address]    Report Status PENDING  Incomplete  Culture, blood (routine x 2) Call MD if unable to obtain prior to antibiotics being given     Status: None (Preliminary result)   Collection Time: 09/18/17 12:20 PM  Result Value Ref Range Status   Specimen Description BLOOD LEFT HAND  Final   Special Requests   Final    BOTTLES DRAWN AEROBIC AND ANAEROBIC Blood Culture adequate volume   Culture   Final    NO GROWTH 1 DAY Performed at [facility], [place] [address] [place], [place] [address]     Report Status PENDING  Incomplete  Culture, blood (routine x 2) Call MD if unable to obtain prior to antibiotics being given     Status: None (Preliminary result)   Collection Time: 09/18/17 12:30 PM  Result Value Ref Range Status   Specimen Description BLOOD RIGHT HAND  Final   Special Requests   Final    BOTTLES DRAWN AEROBIC AND ANAEROBIC Blood Culture adequate volume   Culture   Final    NO GROWTH 1 DAY Performed at [facility], [place] [address] [place], [place] [address]    Report Status PENDING  Incomplete  Respiratory Panel by PCR     Status: Abnormal   Collection Time: 09/18/17  2:08 PM  Result Value Ref Range Status   Adenovirus NOT DETECTED NOT DETECTED Final   Coronavirus 229E NOT DETECTED NOT DETECTED Final   Coronavirus HKU1 NOT DETECTED NOT DETECTED Final   Coronavirus NL63 NOT DETECTED NOT DETECTED Final   Coronavirus OC43 NOT DETECTED NOT DETECTED Final   Metapneumovirus NOT DETECTED NOT DETECTED Final   Rhinovirus / Enterovirus NOT DETECTED NOT DETECTED Final   Influenza A NOT DETECTED NOT DETECTED Final   Influenza B NOT DETECTED NOT DETECTED Final   Parainfluenza Virus 1 NOT DETECTED NOT DETECTED Final   Parainfluenza Virus 2 NOT DETECTED NOT DETECTED Final   Parainfluenza Virus 3 NOT DETECTED NOT DETECTED Final   Parainfluenza Virus 4 NOT DETECTED NOT DETECTED Final   Respiratory Syncytial Virus DETECTED (A) NOT DETECTED Final    Comment: CRITICAL RESULT CALLED TO, READ BACK BY AND VERIFIED WITH: HS Lee RN 17:25 09/18/17 ([email or web address])    Bordetella pertussis NOT DETECTED NOT  DETECTED Final   Chlamydophila pneumoniae NOT DETECTED NOT DETECTED Final   Mycoplasma pneumoniae NOT DETECTED NOT DETECTED Final    Comment: Performed at [facility], [place] [address], [place], [place] [address]     Scheduled Meds: . acetylcysteine  4 mL Nebulization BID  . bisoprolol  10 mg Oral Q supper  . budesonide  0.25 mg Nebulization BID  . chlorhexidine  15 mL Mouth  Rinse BID  . citalopram  40 mg Oral Q supper  . furosemide  60 mg Intravenous TID  . gabapentin  600 mg Oral TID  . ipratropium-albuterol  3 mL Nebulization Q6H  . lactulose  10 g Oral Daily  . mouth rinse  15 mL Mouth Rinse q12n4p  . methylPREDNISolone (SOLU-MEDROL) injection  40 mg Intravenous Q12H  . potassium chloride  30 mEq Oral Daily  . potassium chloride  20 mEq Oral 2 times per day  . rifampin  300 mg Oral BID WC  . spironolactone  100 mg Oral Daily     LOS: 4 days   Cherene Altes, MD [facility] Office  [phone] Pager - Text Page per Amion as per below:  On-Call/Text Page:      [email or web address]      password [email or web address]  If 7PM-7AM, please contact night-coverage [email or web address] Password [facility] 09/20/2017, 9:40 AM

## 2017-09-20 NOTE — Progress Notes (Signed)
Chaplain visited this Patient to have advanced directive completed this AM in the 9AM hour but PT was in the middle of getting bathed, technician asked Chaplain to return at a later time

## 2017-09-20 NOTE — Progress Notes (Signed)
Responded  To [facility] to assist patient with AD.  Patient requested that AD be left so to discuss with husband.   Will follow as needed. Jaclynn Major, [place], [facility], Pager [phone]

## 2017-09-20 NOTE — Progress Notes (Signed)
   Patient Name: [name] Date of Encounter: 09/20/2017, 11:03 AM    Subjective  Patient is progressing, her breathing and cough have improved, she is resting comfortably today on RA. Reports that her fluid pills are helping, says her abdomen also feels less full/bloated today. No other complains at this time. Husband at bedside.   Objective  BP (!) 132/59 (BP Location: Left Arm)   Pulse 79   Temp 98 F (36.7 C) (Oral)   Resp 13   Ht 5' 2"  (1.575 m)   Wt 88.3 kg (194 lb 9.6 oz)   SpO2 100%   BMI 35.59 kg/m   General:  NAD Eyes:   anicteric Lungs:  clear Heart::  S1S2 no rubs, murmurs or gallops Abdomen:  soft and nontender, BS+ Ext:   1+ edema BLE, LLE   Filed Weights   09/17/17 0536 09/18/17 0458 09/19/17 0328  Weight: 89.5 kg (197 lb 5 oz) 86.8 kg (191 lb 6.4 oz) 88.3 kg (194 lb 9.6 oz)   2/14 Abdomen Xray- moderate gaseous distention of stomach. No evidence of SBO or free air.    Assessment and Plan   1. Acute on chronic respiratory failure- RT and hospitalist following, improving  2. Recurrent pleural effusion- likely d/t underlying cirrhosis and hepatic hydrothorax. Last thoracentesis on 2/12- 900 ml. Now managed by upward titration of diuretics  3. RSV pneumonia- RLL, on empiric abx course, continue supportive measures  4. Non-alcoholic cirrhosis/hx of HCC- s/p CT guided ablation. Moderate ascites and anasarca. On spironolactone and lasix.  5. Child pugh score 9, class B- possible TIPS if she is medically stable to undergo process. Still have concerns about worsening encephalopathy after TIPS and if its doable since she has had ablation of liver  6. Macrocytic anemia- F64 and folic normal/high- likely d/t cirrhosis. Hgb down from 9.3 yday to 8.6 today  7. CKD- stable  Continue upwards titration of diuretics , monitor labs. Will consult Dr. Carlean Purl for input also.   Continue supportive care.     [facility] Attending   I have personally seen the  patient, reviewed and repeated key elements of the history and physical and participated in formation of the assessment and plan the student has documented.  She seems improved overall and on exam I do not hear any R pl eff Agree with CXR tomorrow  If she stays w/o effusion on the right would stay on current dose of diuretics  It is looking like TIPS (which is not necessarily a true option) is not necessary (at least not now)  We have gotten her an appt in our office in 2 weeks.  Gatha Mayer, MD, [facility] [phone] (pager) 09/20/2017 3:40 PM

## 2017-09-20 NOTE — Progress Notes (Signed)
Daily Progress Note   Patient Name: [name]       Date: 09/20/2017 DOB: 1946-03-30  Age: 72 y.o. MRN#: [id number] Attending Physician: Cherene Altes, MD Primary Care Physician: Unk Pinto, MD Admit Date: 09/16/2017  Reason for Consultation/Follow-up: Establishing goals of care  Subjective: Patient awake, alert, and oriented. Feels her breathing is better today. Was able to get up to [facility] earlier.   Husband at bedside. Chaplain also at bedside to complete AD packet with them.   Answered questions regarding plan of care. Patient/husband has PMT contact information.   Length of Stay: 4  Current Medications: Scheduled Meds:  . acetylcysteine  4 mL Nebulization BID  . bisoprolol  10 mg Oral Q supper  . budesonide  0.25 mg Nebulization BID  . chlorhexidine  15 mL Mouth Rinse BID  . citalopram  20 mg Oral Q supper  . furosemide  60 mg Intravenous TID  . gabapentin  400 mg Oral TID  . ipratropium-albuterol  3 mL Nebulization Q6H  . lactulose  10 g Oral Daily  . mouth rinse  15 mL Mouth Rinse q12n4p  . methylPREDNISolone (SOLU-MEDROL) injection  40 mg Intravenous Q12H  . potassium chloride  30 mEq Oral Daily  . potassium chloride  20 mEq Oral 2 times per day  . rifampin  300 mg Oral BID WC  . sodium chloride flush  10-40 mL Intracatheter Q12H  . spironolactone  100 mg Oral Daily    Continuous Infusions: . ceFEPime (MAXIPIME) IV Stopped (09/19/17 1309)    PRN Meds: acetaminophen, albuterol, cyclobenzaprine, guaiFENesin-dextromethorphan, lidocaine, ondansetron **OR** ondansetron (ZOFRAN) IV, oxyCODONE, sodium chloride flush, traMADol  Physical Exam  Constitutional: She is oriented to person, place, and time. She is cooperative.  Cardiovascular: Regular rhythm.    Pulmonary/Chest: She has decreased breath sounds. She has wheezes.  Receiving a breathing treatment  Neurological: She is alert and oriented to person, place, and time.  Nursing note and vitals reviewed.          Vital Signs: BP (!) 133/52 (BP Location: Left Arm)   Pulse 77   Temp 97.8 F (36.6 C) (Oral)   Resp 16   Ht 5' 2"  (1.575 m)   Wt 88.3 kg (194 lb 9.6 oz)   SpO2 100%   BMI 35.59 kg/m  SpO2: SpO2: 100 % O2 Device: O2 Device: High Flow Nasal Cannula O2 Flow Rate: O2 Flow Rate (L/min): 8 L/min  Intake/output summary:   Intake/Output Summary (Last 24 hours) at 09/20/2017 2049 Last data filed at 09/20/2017 0700 Gross per 24 hour  Intake 195 ml  Output 1225 ml  Net -1030 ml   LBM: Last BM Date: 09/20/17 Baseline Weight: Weight: 86.2 kg (190 lb) Most recent weight: Weight: 88.3 kg (194 lb 9.6 oz)       Palliative Assessment/Data: PPS 50%   Flowsheet Rows     Most Recent Value  Intake Tab  Referral Department  Hospitalist  Unit at Time of Referral  Intermediate Care Unit  Palliative Care Primary Diagnosis  -- [COPD, recurrent pleural effusion, cirrhosis, hepatocellular carcinoma]  Palliative Care Type  New Palliative care  Reason for referral  Clarify Goals of Care  Date first seen by Palliative Care  09/19/17  Clinical Assessment  Palliative Performance Scale Score  50%  Psychosocial & Spiritual Assessment  Palliative Care Outcomes  Patient/Family meeting held?  Yes  Who was at the meeting?  patient and husband  Palliative Care Outcomes  Clarified goals of care, Provided end of life care assistance, Provided psychosocial or spiritual support, ACP counseling assistance, Counseled regarding hospice      Patient Active Problem List   Diagnosis Date Noted  . Palliative care by specialist   . Acute on chronic respiratory failure ([place]) 09/16/2017  . Acute diastolic CHF (congestive heart failure) ([place]) 09/11/2017  . Pleural effusion 09/11/2017  . Ascites  09/07/2017  . Hepatocellular carcinoma ([place]) 08/30/2017  . Anasarca 12/14/2016  . COPD exacerbation ([place]) 11/30/2016  . Obesity (BMI 30-39.9) 10/12/2016  . Hyperammonemia ([place]) 10/12/2016  . Atrial tachycardia, multifocal ([place])   . PNA (pneumonia) 01/24/2016  . Chronic respiratory failure ([place]) 01/19/2016  . Hepatic encephalopathy ([place]) 09/21/2015  . Hypokalemia 09/21/2015  . Acute on chronic respiratory failure with hypoxemia ([place]) 08/07/2015  . Thrombocytopenia ([place]) 08/07/2015  . Venous insufficiency 07/26/2015  . Goals of care, counseling/discussion 12/14/2013  . Essential hypertension   . Hyperlipidemia   . GERD   . Vitamin D deficiency   . IBS   . Fibromyalgia   . Esophageal varices ([place]) 07/08/2012  . COLONIC POLYPS 11/11/2008  . Anemia 05/28/2008  . Hepatic cirrhosis ([place]) 05/28/2008  . Esophagitis 05/27/2008  . Diverticulosis of large intestine 05/27/2008    Palliative Care Assessment & Plan   Patient Profile: 72 y.o. female  with past medical history of COPD on home oxygen, recurrent pleural effusions, non-alcoholic related liver cirrhosis, hepatocellular carcinoma diagnosed November 2018, hepatic encephalopathy, esophageal varices, fibromyalgia, HLD, HTN, IBS, splenomegaly, vitamin D deficiency, and depression admitted on 09/16/2017 with shortness of breath and abdominal distention. Recent hospitalization 2/2-2/8 for respiratory failure, recurrent pleural effusion s/p thoracentesis, and anasarca. 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 Thoracentesis on 2/12 with 900cc removed. Acute on chronic respiratory failure with underlying COPD on home oxygen. Also possible OSA using prn CPAP/BiPAP HS. CXR on 2/13 consistent with RLL pneumonia. Started on IV antibiotics. Also receiving nebs and steroids. GI consult-consider TIPS versus pleurx. Also with chronic diastolic CHF. Palliative medicine consultation for goals of care/short versus    Assessment: Chronic respiratory failure COPD exacerbation Pneumonia Recurrent right pleural effusion CHF NASH cirrhosis Hepatocellular carcinoma  Recommendations/Plan:  DNR/DNI. MOST form completed and in chart. Otherwise, continue current interventions.  Patient/husband agreeable to TIPS/pleurx if recommended by GI.  Chaplain at bedside. Patient/husband interested in completed AD packet.   Patient hopeful for improvement and further oncology f/u.   Code Status: DNR   Code Status Orders  (From admission, onward)        Start     Ordered   09/16/17 1647  Do not attempt resuscitation (DNR)  Continuous    Question Answer Comment  In the event of cardiac or respiratory ARREST Do not call a "code blue"   In the event of cardiac or respiratory ARREST Do not perform Intubation, CPR, defibrillation or ACLS   In the event of cardiac or respiratory ARREST Use medication by any route, position, wound care, and other measures to relive pain and suffering. May use oxygen, suction and manual treatment of airway obstruction as needed for comfort.      09/16/17 1649    Code Status History    Date Active Date Inactive Code Status Order ID Comments User Context   09/07/2017 20:23 09/13/2017 23:21 Full Code [id number]  Bonnell Public, MD Inpatient   11/30/2016 20:29 12/02/2016 16:56 Partial Code [id number]  Edwin Dada, MD Inpatient   11/08/2016 01:02 11/11/2016 15:07 Partial Code [id number]  Ivor Costa, MD ED   10/12/2016 18:53 10/14/2016 14:29 Full Code [id number]  Rama, Venetia Maxon, MD Inpatient   07/12/2016 00:38 07/13/2016 17:31 Full Code [id number]  Edwin Dada, MD Inpatient   01/20/2016 00:22 01/24/2016 16:13 Full Code [id number]  Albertine Patricia, MD Inpatient   10/29/2015 23:03 10/31/2015 14:10 Full Code [id number]  Reubin Milan, MD Inpatient   09/21/2015 21:13 09/24/2015 15:53 Full Code [id number]  Edwin Dada, MD Inpatient   08/07/2015 18:37 08/09/2015 18:31  Full Code [id number]  Caren Griffins, MD ED    Advance Directive Documentation     Most Recent Value  Type of Advance Directive  Out of facility DNR (pink MOST or yellow form)  Pre-existing out of facility DNR order (yellow form or pink MOST form)  Yellow form placed in chart (order not valid for inpatient use)  "MOST" Form in Place?  No data       Prognosis:   Unable to determine: guarded with acute on chronic respiratory failure secondary to COPD, pneumonia, CHF, recurrent right pleural effusion. Continues to be SOB at rest and with exertion. Also with liver cirrhosis and hepatocellular carcinoma.   Discharge Planning:  To Be Determined  Care plan was discussed with patient, husband, RN CM  Thank you for allowing the Palliative Medicine Team to assist in the care of this patient.   Time In: 1505 Time Out: 1525 Total Time 54mn Prolonged Time Billed  no      Greater than 50%  of this time was spent counseling and coordinating care related to the above assessment and plan.  MIhor Dow FNP-C Palliative Medicine Team  Phone: [phone]: [phone] Please contact Palliative Medicine Team phone at [phone] questions and concerns.

## 2017-09-21 ENCOUNTER — Inpatient Hospital Stay (HOSPITAL_COMMUNITY): Payer: PPO

## 2017-09-21 LAB — COMPREHENSIVE METABOLIC PANEL
ALBUMIN: 2.5 g/dL — AB (ref 3.5–5.0)
ALT: 26 U/L (ref 14–54)
ANION GAP: 12 (ref 5–15)
AST: 27 U/L (ref 15–41)
Alkaline Phosphatase: 152 U/L — ABNORMAL HIGH (ref 38–126)
BILIRUBIN TOTAL: 1.3 mg/dL — AB (ref 0.3–1.2)
BUN: 23 mg/dL — ABNORMAL HIGH (ref 6–20)
CHLORIDE: 97 mmol/L — AB (ref 101–111)
CO2: 32 mmol/L (ref 22–32)
Calcium: 8.5 mg/dL — ABNORMAL LOW (ref 8.9–10.3)
Creatinine, Ser: 1.03 mg/dL — ABNORMAL HIGH (ref 0.44–1.00)
GFR calc Af Amer: >60 mL/min (ref >60)
GFR calc non Af Amer: 54 mL/min — ABNORMAL LOW (ref >60)
GLUCOSE: 121 mg/dL — AB (ref 65–99)
POTASSIUM: 4.5 mmol/L (ref 3.5–5.1)
SODIUM: 141 mmol/L (ref 135–145)
TOTAL PROTEIN: 5 g/dL — AB (ref 6.5–8.1)

## 2017-09-21 LAB — BODY FLUID CULTURE: Culture: NO GROWTH

## 2017-09-21 LAB — CBC
HEMATOCRIT: 32 % — AB (ref 36.0–46.0)
HEMOGLOBIN: 9.3 g/dL — AB (ref 12.0–15.0)
MCH: 31.3 pg (ref 26.0–34.0)
MCHC: 29.1 g/dL — AB (ref 30.0–36.0)
MCV: 107.7 fL — AB (ref 78.0–100.0)
Platelets: 83 10*3/uL — ABNORMAL LOW (ref 150–400)
RBC: 2.97 MIL/uL — ABNORMAL LOW (ref 3.87–5.11)
RDW: 15.9 % — ABNORMAL HIGH (ref 11.5–15.5)
WBC: 5.5 10*3/uL (ref 4.0–10.5)

## 2017-09-21 MED ORDER — ALLOPURINOL 300 MG PO TABS
300.0000 mg | ORAL_TABLET | Freq: Every day | ORAL | Status: DC
  Administered 2017-09-21 – 2017-09-28 (×8): 300 mg via ORAL
  Filled 2017-09-21 (×8): qty 1

## 2017-09-21 MED ORDER — SPIRONOLACTONE 25 MG PO TABS
150.0000 mg | ORAL_TABLET | Freq: Every day | ORAL | Status: DC
Start: 2017-09-22 — End: 2017-09-28
  Administered 2017-09-22 – 2017-09-28 (×7): 150 mg via ORAL
  Filled 2017-09-21 (×8): qty 6

## 2017-09-21 MED ORDER — FUROSEMIDE 10 MG/ML IJ SOLN
80.0000 mg | Freq: Three times a day (TID) | INTRAMUSCULAR | Status: AC
  Administered 2017-09-21 – 2017-09-23 (×9): 80 mg via INTRAVENOUS
  Filled 2017-09-21 (×9): qty 8

## 2017-09-21 NOTE — Progress Notes (Signed)
Progress Note   Assessment / Plan:   Assessment: 1.  Acute on chronic respiratory failure 2.  Recurrent pleural effusion: Thought due to underlying cirrhosis and hepatic, last thoracentesis on 2/12-900 mL's, now managed by upward titration of diuretics, repeat chest x-ray this morning shows improvement in right lower lobe airspace disease 3.  RSV pneumonia: On empiric antibiotic course 4.  Nonalcoholic cirrhosis/history of HCC-status post CT-guided ablation: Moderate ascites and anasarca on spironolactone and Lasix 5.  Child Pugh score 9, class B: Discussion about TIPS in the past though this is thought not necessary at this time 6.  Macrocytic anemia: Hemoglobin stable at 9.3 today 7.  CKD  Plan: 1.  Repeat chest x-ray shows no worsening of pleural effusion.  Stay on current dose of diuretics. 2. Will discuss if paracentesis would be an option for this patient with worsening ascites and abdominal distension 3.  Please await any further recommendations from Dr. Carlean Purl today.  Thank you for your kind consultation.    LOS: 5 days   [name]  09/21/2017, 8:58 AM  Pager # [phone]    [facility] Attending   I have taken an interval history, reviewed the chart and examined the patient. I agree with the Advanced Practitioner's note, impression and recommendations.   She is overall much better though remains ill. I think abdominal wall edema and some lingering pain from abd wall hematoma caused by Lovenox last admit are causing abd sxs  I increased spironolactone to 150 mg qd  Will need to watch K - may need less supplementation    Subjective  Chief Complaint: Acute on chronic respiratory failure, recurrent pleural effusion, nonalcoholic cirrhosis  This morning the patient is found lying in bed, she tells me that her work of breathing is somewhat less today though she has just had a breathing treatment.  She does describe that she feels as though her abdomen is  more tense than yesterday and she has a generalized abdominal discomfort.  Patient also describes that she has not been having her "normal bowel movements" while in the hospital.  She feels somewhat constipated.  At home the patient has at least 3 bowel movements a day with use of her lactulose.  She only managed to have a small bowel movement this morning.  Describes being filled with a lot of gas and tells me she knows she would feel better if she had a bowel movement.    Objective   Vital signs in last 24 hours: Temp:  [97.7 F (36.5 C)-98.3 F (36.8 C)] 98.3 F (36.8 C) (02/16 0807) Pulse Rate:  [77-83] 83 (02/16 0807) Resp:  [13-17] 13 (02/16 0807) BP: (125-153)/(50-70) 131/68 (02/16 0807) SpO2:  [95 %-100 %] 100 % (02/16 0807) FiO2 (%):  [40 %] 40 % (02/16 0807) Weight:  [192 lb 11.2 oz (87.4 kg)] 192 lb 11.2 oz (87.4 kg) (02/16 0511) Last BM Date: 09/20/17 General:    Ill-appearing Caucasian female in NAD Heart:  Regular rate and rhythm; no murmurs Lungs: Increased WOB, lungs CTA bilaterally Abdomen:  Soft, mildly tender RLQ and nondistended but obese and abd wall edema w/ bruise RLQ, small umbilical hernia. Normal bowel sounds. Extremities:  1+ edema b/l LE Neurologic:  Alert and oriented,  grossly normal neurologically.  Intake/Output from previous day: 02/15 0701 - 02/16 0700 In: 57 [P.O.:50] Out: 450 [Urine:450]   Lab Results: Recent Labs    09/19/17 1420 09/20/17 0406 09/21/17 0502  WBC 5.9 3.9*  5.5  HGB 9.3* 8.9* 9.3*  HCT 30.8* 29.8* 32.0*  PLT 104* 89* 83*   BMET Recent Labs    09/19/17 1420 09/20/17 0406 09/21/17 0502  NA 141 141 141  K 4.1 4.5 4.5  CL 100* 101 97*  CO2 30 30 32  GLUCOSE 178* 153* 121*  BUN 21* 21* 23*  CREATININE 1.24* 1.14* 1.03*  CALCIUM 8.5* 8.5* 8.5*   LFT Recent Labs    09/21/17 0502  PROT 5.0*  ALBUMIN 2.5*  AST 27  ALT 26  ALKPHOS 152*  BILITOT 1.3*

## 2017-09-21 NOTE — Progress Notes (Signed)
[place] TEAM 1 - Stepdown/ICU TEAM  [name]  ATF:[id number] DOB: 19-May-1946 DOA: 09/16/2017 PCP: Unk Pinto, MD    Brief Narrative:  72 y.o. female with a hx of non-alcohol related liver cirrhosis, COPD, and hepatocellular carcinoma diagnosed Nov 2018 s/p CT guided ablation with hospitalization 2/2 > 09/13/17 due to anasarca and acute on chronic respiratory failure s/p thoracentesis who returned to the ED 2/11 with recurrent respiratory failure.  CXR showed re-accumulation of a right-sided pleural effusion..    Significant Events: 2/8 d/c  2/11 re-admit w/ rapidly re-accumulated pleural effusion  2/12 thoracentesis in IR   Subjective: The patient is sleeping comfortably in bed.  She easily awakens to my voice.  She reports that her breathing feels better.  She denies chest pain nausea or vomiting.  Assessment & Plan:  Acute on chronic hypoxic resp failure - multifactorial  D/c 2/8 on 4L [place] O2 support - due to essential non-use of R lung in setting of large effusion and atx + PNA + viral pneumonitis - supplement O2 as needed, including BIPAP - begin to wean O2 w/ clear improvement clinically and on CXR today   Recurrent pleural effusion - possible hepatic hydrothorax S/p thoracentesis 09/12/17 (1.5L) - rapidly re-accumulated - repeat thora 2/12 - f/u CXR today much improved, w/ essentially no persisting effusion - cont diuretic w/ hopes of preventing re-accumulation    RLL Pneumonia - RSV Pneumonitis  Now on empiric abx course - cont supportive care - appears to be improving - will stop abx after a 5 day course   Nonalcoholic cirrhosis - hepatocellular carcinoma GI following along w/ her care - alert and oriented   Grade 2 Chronic diastolic CHF  wgt was 27.0 at time of d/c 2/8 - remains modestly volume overloaded - cont to diurese and follow weights - net negative 800cc since admit (+ thoracentesis volume)  Filed Weights   09/18/17 0458 09/19/17 0328 09/21/17 0511    Weight: 86.8 kg (191 lb 6.4 oz) 88.3 kg (194 lb 9.6 oz) 87.4 kg (192 lb 11.2 oz)    COPD Much better compensated at this time - minimal intermittent wheezing appreciated - maximize neb tx   Macrocytic anemia W23 and folic acid normal/high - due to cirrhosis   CKD crt 1.3 09/11/17 - crt stable despite diuresis at this time - follow   Recent Labs  Lab 09/16/17 1253 09/17/17 0407 09/18/17 0208 09/19/17 1420 09/20/17 0406  CREATININE 1.33* 1.23* 1.31* 1.24* 1.14*    OHS - SA  Obesity - Body mass index is 35.25 kg/m.   DVT prophylaxis: SCDs Code Status: DNR - NO CODE Family Communication: no family present at time of exam today  Disposition Plan: SDU  Consultants:  IR GI  Antimicrobials:  Vanc 2/13 > 2/15 Cefepime 2/13 >  Objective: Blood pressure 131/68, pulse 83, temperature 98.3 F (36.8 C), temperature source Oral, resp. rate 13, height 5' 2"  (1.575 m), weight 87.4 kg (192 lb 11.2 oz), SpO2 100 %.  Intake/Output Summary (Last 24 hours) at 09/21/2017 0844 Last data filed at 09/21/2017 0509 Gross per 24 hour  Intake 50 ml  Output 450 ml  Net -400 ml   Filed Weights   09/18/17 0458 09/19/17 0328 09/21/17 0511  Weight: 86.8 kg (191 lb 6.4 oz) 88.3 kg (194 lb 9.6 oz) 87.4 kg (192 lb 11.2 oz)    Examination: General: no acute distress - sleeping soundly - easily awakens  Lungs: minimal diffuse wheezing - mild R  base crackles  Cardiovascular: RRR  Abdomen: Nontender, obese, soft, bowel sounds positive, no rebound  Extremities: 1+ edema bilateral lower extremities  CBC: Recent Labs  Lab 09/16/17 1253 09/17/17 0407 09/18/17 0208 09/19/17 1420 09/20/17 0406 09/21/17 0502  WBC 6.3 3.4* 5.5 5.9 3.9* 5.5  NEUTROABS 4.9  --   --  5.0  --   --   HGB 9.8* 8.3* 9.6* 9.3* 8.9* 9.3*  HCT 32.2* 27.8* 31.9* 30.8* 29.8* 32.0*  MCV 108.8* 106.9* 106.3* 108.8* 108.0* 107.7*  PLT 140* 112* 136* 104* 89* 83*   Basic Metabolic Panel: Recent Labs  Lab 09/16/17 1253  09/17/17 0407 09/18/17 0208 09/19/17 1420 09/20/17 0406  NA 139 140 139 141 141  K 4.0 4.0 3.9 4.1 4.5  CL 100* 99* 99* 100* 101  CO2 28 30 28 30 30   GLUCOSE 126* 153* 161* 178* 153*  BUN 25* 25* 27* 21* 21*  CREATININE 1.33* 1.23* 1.31* 1.24* 1.14*  CALCIUM 8.7* 8.5* 8.6* 8.5* 8.5*  MG  --   --   --  2.0 2.0   GFR: Estimated Creatinine Clearance: 47.1 mL/min (A) (by C-G formula based on SCr of 1.14 mg/dL (H)).  Liver Function Tests: Recent Labs  Lab 09/16/17 1253 09/17/17 0407 09/18/17 0208 09/19/17 1420  AST 32 26 33 35  ALT 28 26 26 26   ALKPHOS 120 100 116 114  BILITOT 1.1 1.1 1.2 1.5*  PROT 5.3* 4.6* 4.8* 5.1*  ALBUMIN 2.9* 2.5* 2.7* 2.8*    Coagulation Profile: Recent Labs  Lab 09/17/17 0708  INR 1.23    Recent Results (from the past 240 hour(s))  Gram stain     Status: None   Collection Time: 09/12/17 11:54 AM  Result Value Ref Range Status   Specimen Description PLEURAL RIGHT  Final   Special Requests NONE  Final   Gram Stain   Final    RARE WBC PRESENT,BOTH PMN AND MONONUCLEAR NO ORGANISMS SEEN Performed at [facility], [place] [address] [place], [place] [address]    Report Status 09/13/2017 FINAL  Final  Culture, body fluid-bottle     Status: None   Collection Time: 09/12/17 11:54 AM  Result Value Ref Range Status   Specimen Description PLEURAL RIGHT  Final   Special Requests NONE  Final   Culture   Final    NO GROWTH 5 DAYS Performed at [facility] [address] [place], [place] [address]    Report Status 09/17/2017 FINAL  Final  MRSA PCR Screening     Status: None   Collection Time: 09/16/17 11:37 PM  Result Value Ref Range Status   MRSA by PCR NEGATIVE NEGATIVE Final    Comment:        The GeneXpert MRSA Assay (FDA approved for NASAL specimens only), is one component of a comprehensive MRSA colonization surveillance program. It is not intended to diagnose MRSA infection nor to guide or monitor treatment for MRSA  infections. Performed at [facility], [place] [address] [place], [place] [address]   Body fluid culture     Status: None (Preliminary result)   Collection Time: 09/17/17  1:00 PM  Result Value Ref Range Status   Specimen Description PLEURAL  Final   Special Requests LEFT  Final   Gram Stain   Final    RARE WBC PRESENT, PREDOMINANTLY PMN NO ORGANISMS SEEN    Culture   Final    NO GROWTH 3 DAYS Performed at [facility], [place] [address], [place], [place] [address]    Report Status PENDING  Incomplete  Culture, blood (routine x 2) Call MD if unable to obtain prior to antibiotics being given     Status: None (Preliminary result)   Collection Time: 09/18/17 12:20 PM  Result Value Ref Range Status   Specimen Description BLOOD LEFT HAND  Final   Special Requests   Final    BOTTLES DRAWN AEROBIC AND ANAEROBIC Blood Culture adequate volume   Culture   Final    NO GROWTH 2 DAYS Performed at [facility], [place] [address], [place], [place] [address]    Report Status PENDING  Incomplete  Culture, blood (routine x 2) Call MD if unable to obtain prior to antibiotics being given     Status: None (Preliminary result)   Collection Time: 09/18/17 12:30 PM  Result Value Ref Range Status   Specimen Description BLOOD RIGHT HAND  Final   Special Requests   Final    BOTTLES DRAWN AEROBIC AND ANAEROBIC Blood Culture adequate volume   Culture   Final    NO GROWTH 2 DAYS Performed at [facility], [place] [address], [place], [place] [address]    Report Status PENDING  Incomplete  Respiratory Panel by PCR     Status: Abnormal   Collection Time: 09/18/17  2:08 PM  Result Value Ref Range Status   Adenovirus NOT DETECTED NOT DETECTED Final   Coronavirus 229E NOT DETECTED NOT DETECTED Final   Coronavirus HKU1 NOT DETECTED NOT DETECTED Final   Coronavirus NL63 NOT DETECTED NOT DETECTED Final   Coronavirus OC43 NOT DETECTED NOT DETECTED Final   Metapneumovirus NOT DETECTED NOT DETECTED  Final   Rhinovirus / Enterovirus NOT DETECTED NOT DETECTED Final   Influenza A NOT DETECTED NOT DETECTED Final   Influenza B NOT DETECTED NOT DETECTED Final   Parainfluenza Virus 1 NOT DETECTED NOT DETECTED Final   Parainfluenza Virus 2 NOT DETECTED NOT DETECTED Final   Parainfluenza Virus 3 NOT DETECTED NOT DETECTED Final   Parainfluenza Virus 4 NOT DETECTED NOT DETECTED Final   Respiratory Syncytial Virus DETECTED (A) NOT DETECTED Final    Comment: CRITICAL RESULT CALLED TO, READ BACK BY AND VERIFIED WITH: HS Lee RN 17:25 09/18/17 ([email or web address])    Bordetella pertussis NOT DETECTED NOT DETECTED Final   Chlamydophila pneumoniae NOT DETECTED NOT DETECTED Final   Mycoplasma pneumoniae NOT DETECTED NOT DETECTED Final    Comment: Performed at [facility], [address] [place], [place] [address]     Scheduled Meds: . acetylcysteine  4 mL Nebulization BID  . bisoprolol  10 mg Oral Q supper  . budesonide  0.25 mg Nebulization BID  . chlorhexidine  15 mL Mouth Rinse BID  . citalopram  20 mg Oral Q supper  . furosemide  60 mg Intravenous TID  . gabapentin  400 mg Oral TID  . ipratropium-albuterol  3 mL Nebulization Q6H  . lactulose  10 g Oral Daily  . mouth rinse  15 mL Mouth Rinse q12n4p  . methylPREDNISolone (SOLU-MEDROL) injection  40 mg Intravenous Q12H  . potassium chloride  30 mEq Oral Daily  . potassium chloride  20 mEq Oral 2 times per day  . rifampin  300 mg Oral BID WC  . sodium chloride flush  10-40 mL Intracatheter Q12H  . spironolactone  100 mg Oral Daily     LOS: 5 days   Cherene Altes, MD  [facility] Office  [phone] Pager - Text Page per Shea Evans as per below:  On-Call/Text Page:      [email or web address]      password [email or web address]  If 7PM-7AM, please contact night-coverage [email or web address] Password [email or web address] 09/21/2017, 8:44 AM

## 2017-09-21 NOTE — Progress Notes (Signed)
Pt refusing CPAP for the night. RT made pt aware if she were to change her mind to let nurse know and they will call RT.  RT will continue to monitor as needed.

## 2017-09-21 NOTE — Progress Notes (Signed)
Attempted to turn patients oxygen off but her SpO2 dropped into the low 80s. I was able to reduce her SpO2 from 5L to 3L however, and she is  Currently 95%.

## 2017-09-22 ENCOUNTER — Inpatient Hospital Stay (HOSPITAL_COMMUNITY): Payer: PPO

## 2017-09-22 LAB — BODY FLUID CELL COUNT WITH DIFFERENTIAL
EOS FL: 0 %
LYMPHS FL: 9 %
MONOCYTE-MACROPHAGE-SEROUS FLUID: 79 % (ref 50–90)
NEUTROPHIL FLUID: 12 % (ref 0–25)
Total Nucleated Cell Count, Fluid: 78 cu mm (ref 0–1000)

## 2017-09-22 LAB — BASIC METABOLIC PANEL
Anion gap: 10 (ref 5–15)
BUN: 25 mg/dL — ABNORMAL HIGH (ref 6–20)
CALCIUM: 8.6 mg/dL — AB (ref 8.9–10.3)
CO2: 32 mmol/L (ref 22–32)
CREATININE: 1.32 mg/dL — AB (ref 0.44–1.00)
Chloride: 96 mmol/L — ABNORMAL LOW (ref 101–111)
GFR, EST AFRICAN AMERICAN: 46 mL/min — AB (ref >60)
GFR, EST NON AFRICAN AMERICAN: 40 mL/min — AB (ref >60)
Glucose, Bld: 200 mg/dL — ABNORMAL HIGH (ref 65–99)
Potassium: 4.2 mmol/L (ref 3.5–5.1)
Sodium: 138 mmol/L (ref 135–145)

## 2017-09-22 LAB — GRAM STAIN: Gram Stain: NONE SEEN

## 2017-09-22 MED ORDER — LIDOCAINE HCL (PF) 1 % IJ SOLN
INTRAMUSCULAR | Status: AC
  Filled 2017-09-22: qty 30

## 2017-09-22 MED ORDER — POLYETHYLENE GLYCOL 3350 17 G PO PACK
17.0000 g | PACK | Freq: Every day | ORAL | Status: DC
  Administered 2017-09-22 – 2017-09-24 (×3): 17 g via ORAL
  Filled 2017-09-22 (×6): qty 1

## 2017-09-22 MED ORDER — MAGNESIUM CITRATE PO SOLN
1.0000 | Freq: Once | ORAL | Status: DC | PRN
Start: 2017-09-22 — End: 2017-09-28
  Filled 2017-09-22: qty 296

## 2017-09-22 MED ORDER — BISACODYL 10 MG RE SUPP: 10.0000 mg | Freq: Every day | RECTAL | Status: DC | PRN

## 2017-09-22 MED ORDER — LACTULOSE 10 GM/15ML PO SOLN
10.0000 g | Freq: Two times a day (BID) | ORAL | Status: DC
  Administered 2017-09-22 – 2017-09-26 (×8): 10 g via ORAL
  Filled 2017-09-22 (×8): qty 15

## 2017-09-22 NOTE — Progress Notes (Signed)
TEAM 1 - Stepdown/ICU TEAM  [name]  BMW:[id number] DOB: [birth year] DOA: 09/16/2017 PCP: Unk Pinto, MD    Brief Narrative:  72 y.o. female with a hx of non-alcohol related liver cirrhosis, COPD, and hepatocellular carcinoma diagnosed Nov 2018 s/p CT guided ablation with hospitalization 2/2 > 09/13/17 due to anasarca and acute on chronic respiratory failure s/p thoracentesis who returned to the ED 2/11 with recurrent respiratory failure.  CXR showed re-accumulation of a right-sided pleural effusion..    Significant Events: 2/8 d/c  2/11 re-admit w/ rapidly re-accumulated pleural effusion  2/12 thoracentesis in IR   Subjective: The patient is sitting up in a bedside chair.  She is alert conversant and pleasant.  She is anxious to be discharged home as soon as possible.  Her abdomen continues to become more & distended and is quite tight today.  She denies abdominal pain but does report constipation.  She feels that her shortness of breath continues to improve but is not yet back to her baseline.  She denies a headache or chest pain.  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 S/p thoracentesis 09/12/17 (1.5L) - rapidly re-accumulated - repeat thora 2/12 - f/u CXR 2/16 much improved, w/ essentially no persisting effusion - cont diuretic w/ hopes of preventing re-accumulation - recheck CXR in AM  RLL Pneumonia - RSV Pneumonitis  to complete a 5 day empiric abx course - clinically improving slowly   Nonalcoholic cirrhosis - hepatocellular carcinoma GI following along w/ her care - alert and oriented - appears she will require a paracentesis soon  - consider 2/18  Grade 2 Chronic diastolic CHF  wgt was 27.2 at time of d/c 2/8 - remains modestly volume overloaded - cont to diurese and follow weights - net negative 1250cc since admit (+ thoracentesis volume) - retaining fluid in abdomen   Filed Weights   09/19/17 0328 09/21/17 0511 09/22/17 0600  Weight: 88.3 kg (194 lb 9.6 oz) 87.4 kg (192 lb 11.2 oz) 91.3 kg (201 lb 4.5 oz)    COPD Much better compensated at this time - cont current care   Macrocytic anemia Z36 and folic acid normal/high - due to cirrhosis   CKD crt 1.3 09/11/17 - crt stable despite diuresis at this time - follow   Recent Labs  Lab 09/18/17 0208 09/19/17 1420 09/20/17 0406 09/21/17 0502 09/22/17 0250  CREATININE 1.31* 1.24* 1.14* 1.03* 1.32*    OHS - SA  Obesity - Body mass index is 36.81 kg/m.   DVT prophylaxis: SCDs Code Status: DNR - NO CODE Family Communication: no family present at time of exam today  Disposition Plan: SDU  Consultants:  IR GI  Antimicrobials:  Vanc 2/13 > 2/15 Cefepime 2/13 >  Objective: Blood pressure (!) 125/59, pulse 86, temperature 98.8 F (37.1 C), temperature source Oral, resp. rate 15, height 5' 2"  (1.575 m), weight 91.3 kg (201 lb 4.5 oz), SpO2 97 %.  Intake/Output Summary (Last 24 hours) at 09/22/2017 0922 Last data filed at 09/21/2017 1600 Gross per 24 hour  Intake 380 ml  Output 800 ml  Net -420 ml  Filed Weights   09/19/17 0328 09/21/17 0511 09/22/17 0600  Weight: 88.3 kg (194 lb 9.6 oz) 87.4 kg (192 lb 11.2 oz) 91.3 kg (201 lb 4.5 oz)    Examination: General: no acute distress - alert and conversant   Lungs: minimal diffuse wheezing - mild bibasilar crackles  Cardiovascular: RRR w/o M or rub  Abdomen: Nontender, quite protuberant - no rebound, bs + Extremities: 1+ edema bilateral lower extremities  CBC: Recent Labs  Lab 09/16/17 1253 09/17/17 0407 09/18/17 0208 09/19/17 1420 09/20/17 0406 09/21/17 0502  WBC 6.3 3.4* 5.5 5.9 3.9*  5.5  NEUTROABS 4.9  --   --  5.0  --   --   HGB 9.8* 8.3* 9.6* 9.3* 8.9* 9.3*  HCT 32.2* 27.8* 31.9* 30.8* 29.8* 32.0*  MCV 108.8* 106.9* 106.3* 108.8* 108.0* 107.7*  PLT 140* 112* 136* 104* 89* 83*   Basic Metabolic Panel: Recent Labs  Lab 09/18/17 0208 09/19/17 1420 09/20/17 0406 09/21/17 0502 09/22/17 0250  NA 139 141 141 141 138  K 3.9 4.1 4.5 4.5 4.2  CL 99* 100* 101 97* 96*  CO2 28 30 30  32 32  GLUCOSE 161* 178* 153* 121* 200*  BUN 27* 21* 21* 23* 25*  CREATININE 1.31* 1.24* 1.14* 1.03* 1.32*  CALCIUM 8.6* 8.5* 8.5* 8.5* 8.6*  MG  --  2.0 2.0  --   --    GFR: Estimated Creatinine Clearance: 41.7 mL/min (A) (by C-G formula based on SCr of 1.32 mg/dL (H)).  Liver Function Tests: Recent Labs  Lab 09/16/17 1253 09/17/17 0407 09/18/17 0208 09/19/17 1420 09/21/17 0502  AST 32 26 33 35 27  ALT 28 26 26 26 26   ALKPHOS 120 100 116 114 152*  BILITOT 1.1 1.1 1.2 1.5* 1.3*  PROT 5.3* 4.6* 4.8* 5.1* 5.0*  ALBUMIN 2.9* 2.5* 2.7* 2.8* 2.5*    Coagulation Profile: Recent Labs  Lab 09/17/17 0708  INR 1.23    Recent Results (from the past 240 hour(s))  Gram stain     Status: None   Collection Time: 09/12/17 11:54 AM  Result Value Ref Range Status   Specimen Description PLEURAL RIGHT  Final   Special Requests NONE  Final   Gram Stain   Final    RARE WBC PRESENT,BOTH PMN AND MONONUCLEAR NO ORGANISMS SEEN Performed at [facility], [place] [address], [place], [place] [address]    Report Status 09/13/2017 FINAL  Final  Culture, body fluid-bottle     Status: None   Collection Time: 09/12/17 11:54 AM  Result Value Ref Range Status   Specimen Description PLEURAL RIGHT  Final   Special Requests NONE  Final   Culture   Final    NO GROWTH 5 DAYS Performed at [facility] [address], [place], [place] [address]    Report Status 09/17/2017 FINAL  Final  MRSA PCR Screening     Status: None   Collection Time: 09/16/17 11:37 PM  Result Value Ref Range  Status   MRSA by PCR NEGATIVE NEGATIVE Final    Comment:        The GeneXpert MRSA Assay (FDA approved for NASAL specimens only), is one component of a comprehensive MRSA colonization surveillance program. It is not intended to diagnose MRSA infection nor to guide or monitor treatment for MRSA infections. Performed at [facility], [place] [address], [place], [place] [address]   Body fluid culture     Status: None   Collection Time:  09/17/17  1:00 PM  Result Value Ref Range Status   Specimen Description PLEURAL  Final   Special Requests LEFT  Final   Gram Stain   Final    RARE WBC PRESENT, PREDOMINANTLY PMN NO ORGANISMS SEEN    Culture   Final    No growth aerobically or anaerobically. Performed at [facility], [place] [address], [place], [place] [address]    Report Status 09/21/2017 FINAL  Final  Culture, blood (routine x 2) Call MD if unable to obtain prior to antibiotics being given     Status: None (Preliminary result)   Collection Time: 09/18/17 12:20 PM  Result Value Ref Range Status   Specimen Description BLOOD LEFT HAND  Final   Special Requests   Final    BOTTLES DRAWN AEROBIC AND ANAEROBIC Blood Culture adequate volume   Culture   Final    NO GROWTH 3 DAYS Performed at [facility], [place] [address] [place], [place] [address]    Report Status PENDING  Incomplete  Culture, blood (routine x 2) Call MD if unable to obtain prior to antibiotics being given     Status: None (Preliminary result)   Collection Time: 09/18/17 12:30 PM  Result Value Ref Range Status   Specimen Description BLOOD RIGHT HAND  Final   Special Requests   Final    BOTTLES DRAWN AEROBIC AND ANAEROBIC Blood Culture adequate volume   Culture   Final    NO GROWTH 3 DAYS Performed at [facility], [place] [address] [place], [place] [address]    Report Status PENDING  Incomplete  Respiratory Panel by PCR     Status: Abnormal   Collection Time: 09/18/17  2:08 PM  Result Value Ref  Range Status   Adenovirus NOT DETECTED NOT DETECTED Final   Coronavirus 229E NOT DETECTED NOT DETECTED Final   Coronavirus HKU1 NOT DETECTED NOT DETECTED Final   Coronavirus NL63 NOT DETECTED NOT DETECTED Final   Coronavirus OC43 NOT DETECTED NOT DETECTED Final   Metapneumovirus NOT DETECTED NOT DETECTED Final   Rhinovirus / Enterovirus NOT DETECTED NOT DETECTED Final   Influenza A NOT DETECTED NOT DETECTED Final   Influenza B NOT DETECTED NOT DETECTED Final   Parainfluenza Virus 1 NOT DETECTED NOT DETECTED Final   Parainfluenza Virus 2 NOT DETECTED NOT DETECTED Final   Parainfluenza Virus 3 NOT DETECTED NOT DETECTED Final   Parainfluenza Virus 4 NOT DETECTED NOT DETECTED Final   Respiratory Syncytial Virus DETECTED (A) NOT DETECTED Final    Comment: CRITICAL RESULT CALLED TO, READ BACK BY AND VERIFIED WITH: HS Lee RN 17:25 09/18/17 ([email or web address])    Bordetella pertussis NOT DETECTED NOT DETECTED Final   Chlamydophila pneumoniae NOT DETECTED NOT DETECTED Final   Mycoplasma pneumoniae NOT DETECTED NOT DETECTED Final    Comment: Performed at [facility], [address], [place], [place] [address]     Scheduled Meds: . allopurinol  300 mg Oral Daily  . bisoprolol  10 mg Oral Q supper  . budesonide  0.25 mg Nebulization BID  . chlorhexidine  15 mL Mouth Rinse BID  . citalopram  20 mg Oral Q supper  . furosemide  80 mg Intravenous TID  . gabapentin  400 mg Oral TID  . ipratropium-albuterol  3 mL Nebulization Q6H  . lactulose  10 g Oral Daily  . mouth rinse  15 mL Mouth Rinse q12n4p  . potassium chloride  20 mEq Oral 2 times  per day  . rifampin  300 mg Oral BID WC  . sodium chloride flush  10-40 mL Intracatheter Q12H  . spironolactone  150 mg Oral Daily     LOS: 6 days   Cherene Altes, MD [facility] Office  [phone] Pager - Text Page per Shea Evans as per below:  On-Call/Text Page:      [email or web address]      password [email or web address]  If 7PM-7AM, please contact  night-coverage [email or web address] Password [email or web address] 09/22/2017, 9:22 AM

## 2017-09-22 NOTE — Procedures (Signed)
Ultrasound-guided diagnostic and therapeutic paracentesis performed yielding 2.6 liters of chylous colored fluid. No immediate complications.  [name] 2:23 PM 09/22/2017

## 2017-09-22 NOTE — Progress Notes (Signed)
    Progress Note   Assessment / Plan:    Assessment: 1.  Acute on chronic respiratory failure: improving 2.  Recurrent pleural effusion: Thought due to underlying cirrhosis and hepatic, last thoracentesis on 2/12-900 mL's, now managed by upward titration of diuretics, repeat chest x-ray 2/16 showed improvement 3.  RSV pneumonia: On empiric antibiotic course 4.  Nonalcoholic cirrhosis/history of HCC-status post CT-guided ablation: Moderate ascites and anasarca on spironolactone and Lasix- spironolactone increased 2/16 to 149m po qd 5.  Child Pugh score 9, class B: Discussion about TIPS in the past though this is thought not necessary at this time 6.  Macrocytic anemia: Hemoglobin stable at 9.3 today 7.  CKD  Plan: 1. Continue supportive measures 2. Continue Diuretics current dose, watch K, may need to decrease supplementation with increase in Spironolactone yesterday to [id number] 3. Please await any further recommendations from Dr. GeCarlean Purlater today  We will sign off. Please let usKoreanow if we may be of any further assistance.   LOS: 6 days   Kerry NianeMarc[facility]2/17/2019, 11:07 AM  Pager # [phone]  [facility] Attending   I have taken an interval history, reviewed the chart and examined the patient. I agree with the Advanced Practitioner's note, impression and recommendations.   CaGatha MayerMD, FALakeland Villageastroenterology [phone]) 09/22/2017 1:44 PM    Subjective  Chief Complaint: Acute on chronic resp failure, recurrent pleural effusion, nonalcoholic cirrhosis  Patient found sitting up by bedside eating lunch with husband. No new complaints. Per her report starting Miralax today for BM. Breathing continues to improve.   Objective   Vital signs in last 24 hours: Temp:  [97.6 F (36.4 C)-98.8 F (37.1 C)] 98.8 F (37.1 C) (02/17 0846) Pulse Rate:  [75-91] 86 (02/17 0846) Resp:  [11-24] 15 (02/17 0846) BP: (125-135)/(48-59) 125/59 (02/17  0846) SpO2:  [89 %-98 %] 97 % (02/17 0900) FiO2 (%):  [32 %] 32 % (02/16 1326) Weight:  [201 lb 4.5 oz (91.3 kg)] 201 lb 4.5 oz (91.3 kg) (02/17 0600) Last BM Date: 09/20/17 General:  Ill-appearing Caucasian female in NAD Heart:  Regular rate and rhythm; no murmurs Lungs: Respirations even and unlabored, lungs CTA bilaterally +o2 via [place] Abdomen:  Tense, nontender and mod distension. Normal bowel sounds. Extremities:  1+ edeme b/l LE Neurologic:  Alert and oriented,  grossly normal neurologically. Psych:  Cooperative. Normal mood and affect.  Intake/Output from previous day: 02/16 0701 - 02/17 0700 In: 380 [P.O.:380] Out: 800 [Urine:800]  Lab Results:  Recent Labs    09/20/17 0406 09/21/17 0502 09/22/17 0250  NA 141 141 138  K 4.5 4.5 4.2  CL 101 97* 96*  CO2 30 32 32  GLUCOSE 153* 121* 200*  BUN 21* 23* 25*  CREATININE 1.14* 1.03* 1.32*  CALCIUM 8.5* 8.5* 8.6*    Studies/Results: Dg Chest Port 1 View  Result Date: 09/21/2017 CLINICAL DATA:  Pleural effusion EXAM: PORTABLE CHEST 1 VIEW COMPARISON:  09/18/2017 FINDINGS: Mild improvement in right lower lobe infiltrate. No effusion. Minimal left lower lobe atelectasis/infiltrate unchanged. Negative for heart failure or edema. IMPRESSION: Improvement in right lower lobe airspace disease. Electronically Signed   By: ChFranchot Gallo.D.   On: 09/21/2017 08:31

## 2017-09-23 ENCOUNTER — Inpatient Hospital Stay (HOSPITAL_COMMUNITY): Payer: PPO

## 2017-09-23 LAB — COMPREHENSIVE METABOLIC PANEL
ALT: 22 U/L (ref 14–54)
ANION GAP: 12 (ref 5–15)
AST: 23 U/L (ref 15–41)
Albumin: 2.4 g/dL — ABNORMAL LOW (ref 3.5–5.0)
Alkaline Phosphatase: 134 U/L — ABNORMAL HIGH (ref 38–126)
BILIRUBIN TOTAL: 1.2 mg/dL (ref 0.3–1.2)
BUN: 26 mg/dL — ABNORMAL HIGH (ref 6–20)
CHLORIDE: 96 mmol/L — AB (ref 101–111)
CO2: 33 mmol/L — ABNORMAL HIGH (ref 22–32)
Calcium: 8.6 mg/dL — ABNORMAL LOW (ref 8.9–10.3)
Creatinine, Ser: 1.3 mg/dL — ABNORMAL HIGH (ref 0.44–1.00)
GFR, EST AFRICAN AMERICAN: 47 mL/min — AB (ref >60)
GFR, EST NON AFRICAN AMERICAN: 41 mL/min — AB (ref >60)
Glucose, Bld: 98 mg/dL (ref 65–99)
POTASSIUM: 4 mmol/L (ref 3.5–5.1)
Sodium: 141 mmol/L (ref 135–145)
TOTAL PROTEIN: 4.6 g/dL — AB (ref 6.5–8.1)

## 2017-09-23 LAB — PATHOLOGIST SMEAR REVIEW: PATH REVIEW: REACTIVE

## 2017-09-23 LAB — CBC
HEMATOCRIT: 29.1 % — AB (ref 36.0–46.0)
HEMATOCRIT: 29.5 % — AB (ref 36.0–46.0)
HEMOGLOBIN: 8.9 g/dL — AB (ref 12.0–15.0)
Hemoglobin: 8.8 g/dL — ABNORMAL LOW (ref 12.0–15.0)
MCH: 32 pg (ref 26.0–34.0)
MCH: 31.6 pg (ref 26.0–34.0)
MCHC: 30.2 g/dL (ref 30.0–36.0)
MCHC: 30.2 g/dL (ref 30.0–36.0)
MCV: 105.8 fL — AB (ref 78.0–100.0)
MCV: 104.6 fL — ABNORMAL HIGH (ref 78.0–100.0)
Platelets: 77 10*3/uL — ABNORMAL LOW (ref 150–400)
Platelets: 88 10*3/uL — ABNORMAL LOW (ref 150–400)
RBC: 2.75 MIL/uL — AB (ref 3.87–5.11)
RBC: 2.82 MIL/uL — ABNORMAL LOW (ref 3.87–5.11)
RDW: 16.1 % — ABNORMAL HIGH (ref 11.5–15.5)
RDW: 16.1 % — AB (ref 11.5–15.5)
WBC: 5.1 10*3/uL (ref 4.0–10.5)
WBC: 7.4 10*3/uL (ref 4.0–10.5)

## 2017-09-23 LAB — CULTURE, BLOOD (ROUTINE X 2)
CULTURE: NO GROWTH
CULTURE: NO GROWTH
SPECIAL REQUESTS: ADEQUATE
Special Requests: ADEQUATE

## 2017-09-23 LAB — PROTIME-INR
INR: 1.24
Prothrombin Time: 15.5 seconds — ABNORMAL HIGH (ref 11.4–15.2)

## 2017-09-23 MED ORDER — TORSEMIDE 20 MG PO TABS
60.0000 mg | ORAL_TABLET | Freq: Two times a day (BID) | ORAL | Status: DC
  Administered 2017-09-24: 60 mg via ORAL
  Filled 2017-09-23: qty 3

## 2017-09-23 MED ORDER — IPRATROPIUM-ALBUTEROL 0.5-2.5 (3) MG/3ML IN SOLN
3.0000 mL | Freq: Three times a day (TID) | RESPIRATORY_TRACT | Status: DC
  Administered 2017-09-24 – 2017-09-28 (×14): 3 mL via RESPIRATORY_TRACT
  Filled 2017-09-23 (×14): qty 3

## 2017-09-23 MED ORDER — MENTHOL 3 MG MT LOZG: 1.0000 | LOZENGE | OROMUCOSAL | Status: DC | PRN

## 2017-09-23 MED ORDER — PHENOL 1.4 % MT LIQD: 1.0000 | OROMUCOSAL | Status: DC | PRN

## 2017-09-23 NOTE — Progress Notes (Signed)
Patient continues to complain of abdominal pain and tightness. Gave pain med, see MAR. Notified MD. Orders received for cbc and abdominal xray. Waiting for results. Will continue to monitor.

## 2017-09-23 NOTE — Progress Notes (Signed)
Physician notified: Thereasa Solo At: 1422  Regarding: Pt states abd hurts, has to lay back to breath easier. RN assessed Abd tight/bigger girth than this AM.

## 2017-09-23 NOTE — Progress Notes (Signed)
Pt refused CPAP for the night. RT will continue to monitor as needed.  

## 2017-09-23 NOTE — Progress Notes (Signed)
[place] TEAM 1 - Stepdown/ICU TEAM  [name]  HQR:[id number] DOB: [birth year] DOA: 09/16/2017 PCP: Unk Pinto, MD    Brief Narrative:  72 y.o. female with a hx of non-alcohol related liver cirrhosis, COPD, and hepatocellular carcinoma diagnosed Nov 2018 s/p CT guided ablation with hospitalization 2/2 > 09/13/17 due to anasarca and acute on chronic respiratory failure s/p thoracentesis who returned to the ED 2/11 with recurrent respiratory failure.  CXR showed re-accumulation of a right-sided pleural effusion..    Significant Events: 2/8 d/c  2/11 re-admit w/ rapidly re-accumulated pleural effusion  2/12 thoracentesis in IR  2/17 paracentesis in Radiology - 2.6L   Subjective: The patient reports that her abdomen feels better post paracentesis.  She denies current shortness of breath but continues to require significant high flow nasal cannula oxygen support.  She denies chest pain nausea or vomiting.  She reports return of a very good appetite this morning.  She did have a moderate bowel movement yesterday.  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 S/p thoracentesis 09/12/17 (1.5L) - rapidly re-accumulated - repeat thora 2/12 - f/u CXR 2/16 much improved, w/ essentially no persisting effusion, and same noted on f/u CXR today - cont diuretic w/ hopes of preventing re-accumulation   RLL Pneumonia - RSV Pneumonitis  to complete a 5 day empiric abx course - clinically improving slowly   Nonalcoholic cirrhosis - hepatocellular carcinoma GI following along w/ her care - alert and oriented - paracentesis  completed 2/17 w/o incident - abdom much less tense today - cont diuretic   Grade 2 Chronic diastolic CHF  wgt was 98.2 at time of d/c 2/8 - remains modestly volume overloaded on exam - cont to diurese and follow weights - net negative 1550cc since admit   Filed Weights   09/21/17 0511 09/22/17 0600 09/23/17 0442  Weight: 87.4 kg (192 lb 11.2 oz) 91.3 kg (201 lb 4.5 oz) 88 kg (194 lb 0.1 oz)    COPD Much better compensated at this time - cont current care   Macrocytic anemia M41 and folic acid normal/high - due to cirrhosis   CKD crt 1.3 09/11/17 - crt remains stable despite diuresis at this time - follow   Recent Labs  Lab 09/19/17 1420 09/20/17 0406 09/21/17 0502 09/22/17 0250 09/23/17 0542  CREATININE 1.24* 1.14* 1.03* 1.32* 1.30*    OHS - SA  Obesity - Body mass index is 35.48 kg/m.   DVT prophylaxis: SCDs Code Status: DNR - NO CODE Family Communication: no family present at time of exam today  Disposition Plan: SDU - mobilize - possible d/c 24-48hrs   Consultants:  IR GI  Antimicrobials:  Vanc 2/13 > 2/15 Cefepime 2/13 >  Objective: Blood pressure (!) 115/45, pulse 81, temperature 98 F (36.7 C), temperature source Oral, resp. rate 18, height 5' 2"  (1.575 m), weight 88 kg (194 lb 0.1 oz), SpO2 94 %.  Intake/Output Summary (Last 24 hours) at 09/23/2017 0850 Last data filed at 09/23/2017 0442 Gross per 24 hour  Intake 1205 ml  Output 1500 ml  Net -  295 ml   Filed Weights   09/21/17 0511 09/22/17 0600 09/23/17 0442  Weight: 87.4 kg (192 lb 11.2 oz) 91.3 kg (201 lb 4.5 oz) 88 kg (194 lb 0.1 oz)    Examination: General: no acute distress - alert  Lungs: CTA th/o - no wheezing  Cardiovascular: RRR  Abdomen: Nontender, much less protuberant - no rebound, bs + Extremities: 1+ edema B LE   CBC: Recent Labs  Lab 09/16/17 1253  09/18/17 0208 09/19/17 1420 09/20/17 0406 09/21/17 0502 09/23/17 0542  WBC 6.3   < > 5.5 5.9 3.9* 5.5 5.1  NEUTROABS 4.9   --   --  5.0  --   --   --   HGB 9.8*   < > 9.6* 9.3* 8.9* 9.3* 8.8*  HCT 32.2*   < > 31.9* 30.8* 29.8* 32.0* 29.1*  MCV 108.8*   < > 106.3* 108.8* 108.0* 107.7* 105.8*  PLT 140*   < > 136* 104* 89* 83* 77*   < > = values in this interval not displayed.   Basic Metabolic Panel: Recent Labs  Lab 09/19/17 1420 09/20/17 0406 09/21/17 0502 09/22/17 0250 09/23/17 0542  NA 141 141 141 138 141  K 4.1 4.5 4.5 4.2 4.0  CL 100* 101 97* 96* 96*  CO2 30 30 32 32 33*  GLUCOSE 178* 153* 121* 200* 98  BUN 21* 21* 23* 25* 26*  CREATININE 1.24* 1.14* 1.03* 1.32* 1.30*  CALCIUM 8.5* 8.5* 8.5* 8.6* 8.6*  MG 2.0 2.0  --   --   --    GFR: Estimated Creatinine Clearance: 41.5 mL/min (A) (by C-G formula based on SCr of 1.3 mg/dL (H)).  Liver Function Tests: Recent Labs  Lab 09/17/17 0407 09/18/17 0208 09/19/17 1420 09/21/17 0502 09/23/17 0542  AST 26 33 35 27 23  ALT 26 26 26 26 22   ALKPHOS 100 116 114 152* 134*  BILITOT 1.1 1.2 1.5* 1.3* 1.2  PROT 4.6* 4.8* 5.1* 5.0* 4.6*  ALBUMIN 2.5* 2.7* 2.8* 2.5* 2.4*    Coagulation Profile: Recent Labs  Lab 09/17/17 0708 09/23/17 0542  INR 1.23 1.24    Recent Results (from the past 240 hour(s))  MRSA PCR Screening     Status: None   Collection Time: 09/16/17 11:37 PM  Result Value Ref Range Status   MRSA by PCR NEGATIVE NEGATIVE Final    Comment:        The GeneXpert MRSA Assay (FDA approved for NASAL specimens only), is one component of a comprehensive MRSA colonization surveillance program. It is not intended to diagnose MRSA infection nor to guide or monitor treatment for MRSA infections. Performed at [facility], [place] [address] [place], [place] [address]   Body fluid culture     Status: None   Collection Time: 09/17/17  1:00 PM  Result Value Ref Range Status   Specimen Description PLEURAL  Final   Special Requests LEFT  Final   Gram Stain   Final    RARE WBC PRESENT, PREDOMINANTLY PMN NO ORGANISMS SEEN     Culture   Final    No growth aerobically or anaerobically. Performed at [facility], [place] [address] [place],  [address]    Report Status 09/21/2017 FINAL  Final  Culture, blood (routine x 2) Call MD if unable to obtain prior to antibiotics being given     Status: None (Preliminary result)   Collection Time: 09/18/17 12:20 PM  Result Value Ref Range Status  Specimen Description BLOOD LEFT HAND  Final   Special Requests   Final    BOTTLES DRAWN AEROBIC AND ANAEROBIC Blood Culture adequate volume   Culture   Final    NO GROWTH 4 DAYS Performed at [facility], [address] [place], [place] [address]    Report Status PENDING  Incomplete  Culture, blood (routine x 2) Call MD if unable to obtain prior to antibiotics being given     Status: None (Preliminary result)   Collection Time: 09/18/17 12:30 PM  Result Value Ref Range Status   Specimen Description BLOOD RIGHT HAND  Final   Special Requests   Final    BOTTLES DRAWN AEROBIC AND ANAEROBIC Blood Culture adequate volume   Culture   Final    NO GROWTH 4 DAYS Performed at [facility], [place] [address], [place], [place] [address]    Report Status PENDING  Incomplete  Respiratory Panel by PCR     Status: Abnormal   Collection Time: 09/18/17  2:08 PM  Result Value Ref Range Status   Adenovirus NOT DETECTED NOT DETECTED Final   Coronavirus 229E NOT DETECTED NOT DETECTED Final   Coronavirus HKU1 NOT DETECTED NOT DETECTED Final   Coronavirus NL63 NOT DETECTED NOT DETECTED Final   Coronavirus OC43 NOT DETECTED NOT DETECTED Final   Metapneumovirus NOT DETECTED NOT DETECTED Final   Rhinovirus / Enterovirus NOT DETECTED NOT DETECTED Final   Influenza A NOT DETECTED NOT DETECTED Final   Influenza B NOT DETECTED NOT DETECTED Final   Parainfluenza Virus 1 NOT DETECTED NOT DETECTED Final   Parainfluenza Virus 2 NOT DETECTED NOT DETECTED Final   Parainfluenza Virus 3 NOT DETECTED NOT DETECTED Final   Parainfluenza  Virus 4 NOT DETECTED NOT DETECTED Final   Respiratory Syncytial Virus DETECTED (A) NOT DETECTED Final    Comment: CRITICAL RESULT CALLED TO, READ BACK BY AND VERIFIED WITH: HS Lee RN 17:25 09/18/17 ([email or web address])    Bordetella pertussis NOT DETECTED NOT DETECTED Final   Chlamydophila pneumoniae NOT DETECTED NOT DETECTED Final   Mycoplasma pneumoniae NOT DETECTED NOT DETECTED Final    Comment: Performed at [facility], [address], [place], [place] [address]  Gram stain     Status: None   Collection Time: 09/22/17  2:01 PM  Result Value Ref Range Status   Specimen Description PERITONEAL  Final   Special Requests NONE  Final   Gram Stain   Final    NO WBC SEEN NO ORGANISMS SEEN Performed at [facility], [address] [place], [place] [address]    Report Status 09/22/2017 FINAL  Final     Scheduled Meds: . allopurinol  300 mg Oral Daily  . bisoprolol  10 mg Oral Q supper  . budesonide  0.25 mg Nebulization BID  . chlorhexidine  15 mL Mouth Rinse BID  . citalopram  20 mg Oral Q supper  . furosemide  80 mg Intravenous TID  . gabapentin  400 mg Oral TID  . ipratropium-albuterol  3 mL Nebulization Q6H  . lactulose  10 g Oral BID  . mouth rinse  15 mL Mouth Rinse q12n4p  . polyethylene glycol  17 g Oral Daily  . potassium chloride  20 mEq Oral 2 times per day  . rifampin  300 mg Oral BID WC  . sodium chloride flush  10-40 mL Intracatheter Q12H  . spironolactone  150 mg Oral Daily     LOS: 7  days   Cherene Altes, MD [facility] Office  [phone] Pager - Text Page per Amion as per below:  On-Call/Text Page:      [email or web address]      password [email or web address]  If 7PM-7AM, please contact night-coverage [email or web address] Password [facility] 09/23/2017, 8:50 AM

## 2017-09-24 LAB — BASIC METABOLIC PANEL
Anion gap: 11 (ref 5–15)
BUN: 25 mg/dL — AB (ref 6–20)
CHLORIDE: 96 mmol/L — AB (ref 101–111)
CO2: 34 mmol/L — AB (ref 22–32)
CREATININE: 1.31 mg/dL — AB (ref 0.44–1.00)
Calcium: 9 mg/dL (ref 8.9–10.3)
GFR calc Af Amer: 47 mL/min — ABNORMAL LOW (ref >60)
GFR calc non Af Amer: 40 mL/min — ABNORMAL LOW (ref >60)
GLUCOSE: 145 mg/dL — AB (ref 65–99)
Potassium: 4.3 mmol/L (ref 3.5–5.1)
Sodium: 141 mmol/L (ref 135–145)

## 2017-09-24 MED ORDER — ALBUMIN HUMAN 5 % IV SOLN
12.5000 g | Freq: Once | INTRAVENOUS | Status: AC
  Administered 2017-09-24: 12.5 g via INTRAVENOUS
  Filled 2017-09-24: qty 250

## 2017-09-24 MED ORDER — DEXTROSE 5 % IV SOLN
100.0000 mg | Freq: Three times a day (TID) | INTRAVENOUS | Status: AC
  Administered 2017-09-24 – 2017-09-27 (×9): 100 mg via INTRAVENOUS
  Filled 2017-09-24 (×9): qty 10

## 2017-09-24 NOTE — Progress Notes (Signed)
[place] TEAM 1 - Stepdown/ICU TEAM  [place] COHICK  OEV:[id number] DOB: June 28, 1946 DOA: 09/16/2017 PCP: Unk Pinto, MD    Brief Narrative:  72 y.o. female with a hx of non-alcohol related liver cirrhosis, COPD, and hepatocellular carcinoma diagnosed Nov 2018 s/p CT guided ablation with hospitalization 2/2 > 09/13/17 due to anasarca and acute on chronic respiratory failure s/p thoracentesis who returned to the ED 2/11 with recurrent respiratory failure.  CXR showed re-accumulation of a right-sided pleural effusion..    Significant Events: 2/8 d/c  2/11 re-admit w/ rapidly re-accumulated pleural effusion  2/12 thoracentesis in IR  2/17 paracentesis in Radiology - 2.6L   Subjective: Yesterday afternoon the pt developed abdom pain and fullness.  CBC noted no change in Hgb, and KUB was not c/w ileus or SBO.  Today she reports that her abdom continues to feel taught, and that it is interfering w/ her ability to take a deep breath.  She denies cp, nv/, or ha.    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 S/p thoracentesis 09/12/17 (1.5L) - rapidly re-accumulated - repeat thora 2/12 - f/u CXR 2/16 much improved, w/ essentially no persisting effusion, and same noted on f/u CXR 2/18 - cont diuretic w/ hopes of preventing re-accumulation   Abdom distention - Ascites Very rapidly re-accumulated - no evidence of bowel obstruction or ileus - cont diuresis and follow - may require repeat paracentesis if does not respond to diuretic   RLL Pneumonia - RSV Pneumonitis  Has completed a 5 day empiric abx course - WBC normal and afebrile   Nonalcoholic cirrhosis -  hepatocellular carcinoma GI has evaluated her - alert and oriented - paracentesis completed 2/17 w/o incident - see discussion above regarding recurrent ascites   Grade 2 Chronic diastolic CHF  wgt was 82.9 at time of d/c 2/8 - remains overloaded on exam - increase diuresis - follow weights - net negative 1900cc since admit   Filed Weights   09/22/17 0600 09/23/17 0442 09/24/17 0439  Weight: 91.3 kg (201 lb 4.5 oz) 88 kg (194 lb 0.1 oz) 87.2 kg (192 lb 3.9 oz)    COPD No active wheezing at this time   Macrocytic anemia H37 and folic acid normal/high - due to cirrhosis   CKD crt 1.3 09/11/17 - crt remains stable despite diuresis at this time - follow w/ increasing diuretic   Recent Labs  Lab 09/20/17 0406 09/21/17 0502 09/22/17 0250 09/23/17 0542 09/24/17 0218  CREATININE 1.14* 1.03* 1.32* 1.30* 1.31*    OHS - SA  Obesity - Body mass index is 35.16 kg/m.   DVT prophylaxis: SCDs Code Status: DNR - NO CODE Family Communication: no family present at time of exam today  Disposition Plan: SDU - mobilize - possible d/c 24-48hrs   Consultants:  IR GI  Antimicrobials:  Vanc 2/13 > 2/15 Cefepime 2/13 > 2/17  Objective: Blood pressure (!) 129/53, pulse 73, temperature 98.7 F (37.1 C), temperature source Oral, resp. rate 17, height 5' 2"  (1.575 m), weight 87.2 kg (192 lb 3.9 oz), SpO2 (!) 88 %.  Intake/Output Summary (Last 24 hours) at 09/24/2017 1208  Last data filed at 09/24/2017 0446 Gross per 24 hour  Intake 310 ml  Output 700 ml  Net -390 ml   Filed Weights   09/22/17 0600 09/23/17 0442 09/24/17 0439  Weight: 91.3 kg (201 lb 4.5 oz) 88 kg (194 lb 0.1 oz) 87.2 kg (192 lb 3.9 oz)    Examination: General: no acute distress - alert and pleasant  Lungs: CTA th/o - no wheezing - poor air movement B bases  Cardiovascular: RRR - no M or rub   Abdomen: soft, diffusely mildly tender, much more protuberant today/tense, BS+, no rebound  Extremities: 1+ edema B LE w/o  signif change   CBC: Recent Labs  Lab 09/19/17 1420 09/20/17 0406 09/21/17 0502 09/23/17 0542 09/23/17 1518  WBC 5.9 3.9* 5.5 5.1 7.4  NEUTROABS 5.0  --   --   --   --   HGB 9.3* 8.9* 9.3* 8.8* 8.9*  HCT 30.8* 29.8* 32.0* 29.1* 29.5*  MCV 108.8* 108.0* 107.7* 105.8* 104.6*  PLT 104* 89* 83* 77* 88*   Basic Metabolic Panel: Recent Labs  Lab 09/19/17 1420 09/20/17 0406 09/21/17 0502 09/22/17 0250 09/23/17 0542 09/24/17 0218  NA 141 141 141 138 141 141  K 4.1 4.5 4.5 4.2 4.0 4.3  CL 100* 101 97* 96* 96* 96*  CO2 30 30 32 32 33* 34*  GLUCOSE 178* 153* 121* 200* 98 145*  BUN 21* 21* 23* 25* 26* 25*  CREATININE 1.24* 1.14* 1.03* 1.32* 1.30* 1.31*  CALCIUM 8.5* 8.5* 8.5* 8.6* 8.6* 9.0  MG 2.0 2.0  --   --   --   --    GFR: Estimated Creatinine Clearance: 40.9 mL/min (A) (by C-G formula based on SCr of 1.31 mg/dL (H)).  Liver Function Tests: Recent Labs  Lab 09/18/17 0208 09/19/17 1420 09/21/17 0502 09/23/17 0542  AST 33 35 27 23  ALT 26 26 26 22   ALKPHOS 116 114 152* 134*  BILITOT 1.2 1.5* 1.3* 1.2  PROT 4.8* 5.1* 5.0* 4.6*  ALBUMIN 2.7* 2.8* 2.5* 2.4*    Coagulation Profile: Recent Labs  Lab 09/23/17 0542  INR 1.24    Recent Results (from the past 240 hour(s))  MRSA PCR Screening     Status: None   Collection Time: 09/16/17 11:37 PM  Result Value Ref Range Status   MRSA by PCR NEGATIVE NEGATIVE Final    Comment:        The GeneXpert MRSA Assay (FDA approved for NASAL specimens only), is one component of a comprehensive MRSA colonization surveillance program. It is not intended to diagnose MRSA infection nor to guide or monitor treatment for MRSA infections. Performed at [facility], [place] [address] [place], [place] [address]   Body fluid culture     Status: None   Collection Time: 09/17/17  1:00 PM  Result Value Ref Range Status   Specimen Description PLEURAL  Final   Special Requests LEFT  Final   Gram Stain   Final    RARE WBC  PRESENT, PREDOMINANTLY PMN NO ORGANISMS SEEN    Culture   Final    No growth aerobically or anaerobically. Performed at [facility], [place] [address], [place], [place] [address]    Report Status 09/21/2017 FINAL  Final  Culture, blood (routine x 2) Call MD if unable to obtain prior to antibiotics being given     Status: None   Collection Time: 09/18/17 12:20 PM  Result Value Ref Range Status   Specimen  Description BLOOD LEFT HAND  Final   Special Requests   Final    BOTTLES DRAWN AEROBIC AND ANAEROBIC Blood Culture adequate volume   Culture   Final    NO GROWTH 5 DAYS Performed at [facility], [address], [place], [place] [address]    Report Status 09/23/2017 FINAL  Final  Culture, blood (routine x 2) Call MD if unable to obtain prior to antibiotics being given     Status: None   Collection Time: 09/18/17 12:30 PM  Result Value Ref Range Status   Specimen Description BLOOD RIGHT HAND  Final   Special Requests   Final    BOTTLES DRAWN AEROBIC AND ANAEROBIC Blood Culture adequate volume   Culture   Final    NO GROWTH 5 DAYS Performed at [facility], [place] [address], [place], [place] [address]    Report Status 09/23/2017 FINAL  Final  Respiratory Panel by PCR     Status: Abnormal   Collection Time: 09/18/17  2:08 PM  Result Value Ref Range Status   Adenovirus NOT DETECTED NOT DETECTED Final   Coronavirus 229E NOT DETECTED NOT DETECTED Final   Coronavirus HKU1 NOT DETECTED NOT DETECTED Final   Coronavirus NL63 NOT DETECTED NOT DETECTED Final   Coronavirus OC43 NOT DETECTED NOT DETECTED Final   Metapneumovirus NOT DETECTED NOT DETECTED Final   Rhinovirus / Enterovirus NOT DETECTED NOT DETECTED Final   Influenza A NOT DETECTED NOT DETECTED Final   Influenza B NOT DETECTED NOT DETECTED Final   Parainfluenza Virus 1 NOT DETECTED NOT DETECTED Final   Parainfluenza Virus 2 NOT DETECTED NOT DETECTED Final   Parainfluenza Virus 3 NOT DETECTED NOT DETECTED Final     Parainfluenza Virus 4 NOT DETECTED NOT DETECTED Final   Respiratory Syncytial Virus DETECTED (A) NOT DETECTED Final    Comment: CRITICAL RESULT CALLED TO, READ BACK BY AND VERIFIED WITH: HS Lee RN 17:25 09/18/17 ([email or web address])    Bordetella pertussis NOT DETECTED NOT DETECTED Final   Chlamydophila pneumoniae NOT DETECTED NOT DETECTED Final   Mycoplasma pneumoniae NOT DETECTED NOT DETECTED Final    Comment: Performed at [facility], [place] [address], [place], [place] [address]  Culture, body fluid-bottle     Status: None (Preliminary result)   Collection Time: 09/22/17  2:01 PM  Result Value Ref Range Status   Specimen Description PERITONEAL  Final   Special Requests BOTTLES DRAWN AEROBIC AND ANAEROBIC  Final   Culture   Final    NO GROWTH < 24 HOURS Performed at [facility], [place] [address] [place], [place] [address]    Report Status PENDING  Incomplete  Gram stain     Status: None   Collection Time: 09/22/17  2:01 PM  Result Value Ref Range Status   Specimen Description PERITONEAL  Final   Special Requests NONE  Final   Gram Stain   Final    NO WBC SEEN NO ORGANISMS SEEN Performed at [facility], [address] [place],  [address]    Report Status 09/22/2017 FINAL  Final     Scheduled Meds: . allopurinol  300 mg Oral Daily  . bisoprolol  10 mg Oral Q supper  . budesonide  0.25 mg Nebulization BID  . chlorhexidine  15 mL Mouth Rinse BID  . citalopram  20 mg Oral Q supper  . gabapentin  400 mg Oral TID  . ipratropium-albuterol  3 mL Nebulization TID  .  lactulose  10 g Oral BID  . mouth rinse  15 mL Mouth Rinse q12n4p  . polyethylene glycol  17 g Oral Daily  . potassium chloride  20 mEq Oral 2 times per day  . rifampin  300 mg Oral BID WC  . sodium chloride flush  10-40 mL Intracatheter Q12H  . spironolactone  150 mg Oral Daily  . torsemide  60 mg Oral BID     LOS: 8 days   Cherene Altes, MD [facility] Office   [phone] Pager - Text Page per Amion as per below:  On-Call/Text Page:      [email or web address]      password [email or web address]  If 7PM-7AM, please contact night-coverage [email or web address] Password [facility] 09/24/2017, 12:08 PM

## 2017-09-24 NOTE — Evaluation (Signed)
Occupational Therapy Evaluation Patient Details Name: [name] MRN: [id number] DOB: [birth year] Today's Date: 09/24/2017    History of Present Illness 72 yo female with recent hospitalization due to acute on chronic respiratory failure in the setting of COPD exacerbation, discharged on 09/13/17. At home she had worsening SOB and SpO2 sats at 85%, returned to the ED. Diagnosed with acute on chronic respiratory failure with worsening effusion in the setting of COPD, pleural effusion on R. PMH asthma, liver CA, COPD, O2 dependent at baseline, depression, DM, fibromyalgia, hepatic encephalopathy, HTN, IBS, hx of back surgery, hip arthroplasties, hx neck surgery, hx shoulder surgery    Clinical Impression   Pt a admitted with the above diagnosis and has the deficits listed below. Pt would benefit from cont OT to increase independence with all adls and attempt to educate husband what pt is able to do and what she needs assist with.  Husband has assisted pt with adls for years.  Feel she could do a bit more for herself if she tried when feeling better.  Would recommend HHOT to attempt to carry this through with husband.  Pt will need 24 hour assist and S at d/c.     Follow Up Recommendations  Home health OT;Supervision/Assistance - 24 hour    Equipment Recommendations  None recommended by OT    Recommendations for Other Services       Precautions / Restrictions Precautions Precautions: Fall Precaution Comments: pt has multiple falls in past Restrictions Weight Bearing Restrictions: No      Mobility Bed Mobility Overal bed mobility: Needs Assistance Bed Mobility: Supine to Sit     Supine to sit: Min assist;HOB elevated Sit to supine: Min assist   General bed mobility comments: increased time and effort, MinA to bring trunk up; Min guard due to back pain sitting EOB   Transfers Overall transfer level: Needs assistance Equipment used: Rolling walker (2 wheeled) Transfers: Sit  to/from [facility] Sit to Stand: Min guard Stand pivot transfers: Min guard       General transfer comment: Cues for hand placement    Balance Overall balance assessment: Needs assistance Sitting-balance support: Feet supported;Bilateral upper extremity supported Sitting balance-Leahy Scale: Fair     Standing balance support: Bilateral upper extremity supported;During functional activity Standing balance-Leahy Scale: Fair Standing balance comment: reliant on B UE support                            ADL either performed or assessed with clinical judgement   ADL Overall ADL's : Needs assistance/impaired Eating/Feeding: Set up;Sitting   Grooming: Wash/dry hands;Wash/dry face;Oral care;Brushing hair;Moderate assistance;Sitting Grooming Details (indicate cue type and reason): Pt has a hard time combing hair and reaching overhead.  Husband states since she is right handed it is hard for her to do things with her L hand.  Pt encouraged to use L hand as much as possible for adls. Upper Body Bathing: Set up;Sitting   Lower Body Bathing: Moderate assistance;Sit to/from stand Lower Body Bathing Details (indicate cue type and reason): Husband assists for last two years for bottom and below knees. Pt has a long sponge but husband says she cannot reach below knees with long sponge. Upper Body Dressing : Moderate assistance;Sitting Upper Body Dressing Details (indicate cue type and reason): Pt limited pain and decreased R shoulder movement.  She states she sometimes dresses her upper body but often her husband does this for her.  She has difficulty with her bras.   Lower Body Dressing: Maximal assistance;Sit to/from stand Lower Body Dressing Details (indicate cue type and reason): Husband states he starts her pants for her and donns her socks and shoes for her.  Pt has been introduced to reacher and sock aid but depends on husband instead.  Did talk to husband about  trying to let her do a bit more for herself to encourage independence and take care of his own  needs but he said he has done it so long that he can't make her be more independent. Toilet Transfer: Minimal assistance;Stand-pivot;BSC   Toileting- Clothing Manipulation and Hygiene: Total assistance;Sit to/from stand Toileting - Clothing Manipulation Details (indicate cue type and reason): Pt states she cannot clean self with L hand after bowel movements so only cleans self when she urinates.     Functional mobility during ADLs: Min guard;Rolling walker General ADL Comments: Pt very limited with all adls.  Husband has assisted her for 2 years with adls.  They had [place] but husband stated he was not sure it helped because he still did everything for her when not in therapy even if she could do it because it was faster if he did iit.     Vision Baseline Vision/History: No visual deficits Patient Visual Report: No change from baseline Vision Assessment?: No apparent visual deficits     Perception     Praxis      Pertinent Vitals/Pain Pain Assessment: Faces Faces Pain Scale: Hurts little more Pain Location: back Pain Descriptors / Indicators: Sore;Aching Pain Intervention(s): Limited activity within patient's tolerance;Monitored during session;Repositioned     Hand Dominance Right   Extremity/Trunk Assessment Upper Extremity Assessment Upper Extremity Assessment: RUE deficits/detail RUE Deficits / Details: R shoulder ROM 70 degrees flexion and painful.  Otherwise WFL RUE: Unable to fully assess due to pain RUE Coordination: decreased gross motor   Lower Extremity Assessment Lower Extremity Assessment: Defer to PT evaluation   Cervical / Trunk Assessment Cervical / Trunk Assessment: Kyphotic   Communication Communication Communication: No difficulties   Cognition Arousal/Alertness: Awake/alert Behavior During Therapy: WFL for tasks assessed/performed Overall Cognitive Status:  Within Functional Limits for tasks assessed                                 General Comments: Pts husband answers all questions even when directed not to but feel pt is most likely cognitvely intact.  Pt was dozing at times during session.   General Comments  Pt with very raw bottom    Exercises     Shoulder Instructions      Home Living Family/patient expects to be discharged to:: Private residence Living Arrangements: Spouse/significant other Available Help at Discharge: Family;Available 24 hours/day Type of Home: House Home Access: Stairs to enter [employer] of Steps: 1 Entrance Stairs-Rails: None Home Layout: One level     Bathroom Shower/Tub: Walk-in shower;Door   [employer] Toilet: Standard     Home Equipment: Environmental consultant - 2 wheels;Bedside commode;Shower seat;Transport chair          Prior Functioning/Environment Level of Independence: Needs assistance  Gait / Transfers Assistance Needed: reports she uses RW at home, but will sometimes use WC as well  ADL's / Homemaking Assistance Needed: husband helps with some ADLs including showering, IADLs   Comments: RW for household distances        OT Problem List: Decreased strength;Decreased range of  motion;Decreased activity tolerance;Impaired balance (sitting and/or standing);Decreased knowledge of use of DME or AE;Cardiopulmonary status limiting activity;Impaired UE functional use;Pain      OT Treatment/Interventions:      OT Goals(Current goals can be found in the care plan section) Acute Rehab OT Goals Patient Stated Goal: to go home OT Goal Formulation: With patient/family Time For Goal Achievement: 10/08/17 Potential to Achieve Goals: Fair ADL Goals Pt Will Perform Grooming: with supervision;standing Pt Will Transfer to Toilet: with supervision;ambulating Additional ADL Goal #1: Pt will clean self using L hand after bowel movement with min assist. Additional ADL Goal #2: Pt will state  two things she can do at home to conserve energy but continue to be independent with no cues.  OT Frequency:     Barriers to D/C:            Co-evaluation              AM-PAC PT "6 Clicks" Daily Activity     Outcome Measure Help from another person eating meals?: A Little Help from another person taking care of personal grooming?: A Little Help from another person toileting, which includes using toliet, bedpan, or urinal?: A Lot Help from another person bathing (including washing, rinsing, drying)?: A Lot Help from another person to put on and taking off regular upper body clothing?: A Little Help from another person to put on and taking off regular lower body clothing?: A Lot 6 Click Score: 15   End of Session Equipment Utilized During Treatment: Rolling walker;Oxygen Nurse Communication: Mobility status  Activity Tolerance: Patient limited by pain;Patient limited by fatigue Patient left: in chair;with call bell/phone within reach;with chair alarm set;with family/visitor present  OT Visit Diagnosis: Muscle weakness (generalized) (M62.81);History of falling (Z91.81);Pain Pain - part of body: (back)                Time: 7425-9563 OT Time Calculation (min): 28 min Charges:  OT General Charges $OT Visit: 1 Visit OT Evaluation $OT Eval Moderate Complexity: 1 Mod OT Treatments $Self Care/Home Management : 8-22 mins G-Codes:     Jinger Neighbors, OTR/L [phone]  Glenford Peers 09/24/2017, 12:28 PM

## 2017-09-24 NOTE — Evaluation (Signed)
Physical Therapy Evaluation Patient Details Name: [name] MRN: [id number] DOB: 03/24/1946 Today's Date: 09/24/2017   History of Present Illness  72 yo female with recent hospitalization due to acute on chronic respiratory failure in the setting of COPD exacerbation, discharged on 09/13/17. At home she had worsening SOB and SpO2 sats at 85%, returned to the ED. Diagnosed with acute on chronic respiratory failure with worsening effusion in the setting of COPD, pleural effusion on R. PMH asthma, liver CA, COPD, O2 dependent at baseline, depression, DM, fibromyalgia, hepatic encephalopathy, HTN, IBS, hx of back surgery, hip arthroplasties, hx neck surgery, hx shoulder surgery   Clinical Impression   Patient received in bed, pleasant and willing to participate with skilled PT services this morning. Of note, she does tend to desaturate into the 80s when talking during initial interview, required cues for pursed lip breathing to maintain O2 sats. She is able to perform functional bed mobility and functional transfers with MinA, however with activity SpO2 continues to drop into 80s, requiring increase of O2 from 3LPM to ultimately 5LPM to maintain in the 90s with activity. She was able to take approximately 3-4 steps from the bedside commode to the recliner with RW and min guard. RN present during part of session and aware of increase in O2 as well as patient's mobility status; patient left up in chair with RN present, chair alarm activated. Moving forward she will benefit from skilled PT services in the acute setting, and will also likely benefit from skilled HHPT services to further address functional deficits and reduce fall risk at home.     Follow Up Recommendations Home health PT;Supervision/Assistance - 24 hour    Equipment Recommendations  None recommended by PT(patient appears to have all necessary DME )    Recommendations for Other Services       Precautions / Restrictions  Precautions Precautions: Fall Restrictions Weight Bearing Restrictions: No      Mobility  Bed Mobility Overal bed mobility: Needs Assistance Bed Mobility: Supine to Sit     Supine to sit: Min assist;HOB elevated     General bed mobility comments: increased time and effort, MinA to bring trunk up; Min guard due to back pain sitting EOB   Transfers Overall transfer level: Needs assistance Equipment used: Rolling walker (2 wheeled) Transfers: Sit to/from [facility] Sit to Stand: Min assist Stand pivot transfers: Min guard       General transfer comment: MinA for boost when standing, able to perform stand pivot with Min guard and RW   Ambulation/Gait Ambulation/Gait assistance: Min guard   Assistive device: Rolling walker (2 wheeled)       General Gait Details: 3-4 steps from [facility] to chair  Stairs            Wheelchair Mobility    Modified Rankin (Stroke Patients Only)       Balance Overall balance assessment: Needs assistance Sitting-balance support: Feet supported;Bilateral upper extremity supported Sitting balance-Leahy Scale: Fair     Standing balance support: Bilateral upper extremity supported;During functional activity Standing balance-Leahy Scale: Fair Standing balance comment: reliant on B UE support                              Pertinent Vitals/Pain Pain Assessment: Faces Faces Pain Scale: Hurts even more Pain Location: no pain initially, did have increased back pain sitting at EOB with feet unsupported, improved when feet were supported  Pain  Descriptors / Indicators: Hervey Ard;Sore Pain Intervention(s): Limited activity within patient's tolerance;Monitored during session;Repositioned    Home Living Family/patient expects to be discharged to:: Private residence Living Arrangements: Spouse/significant other Available Help at Discharge: Family Type of Home: House Home Access: Stairs to enter Entrance  Stairs-Rails: None Entrance Stairs-Number of Steps: 1 Home Layout: One level Home Equipment: Environmental consultant - 2 wheels;Bedside commode;Shower seat;Wheelchair - manual      Prior Function Level of Independence: Needs assistance   Gait / Transfers Assistance Needed: reports she uses RW at home, but will sometimes use WC as well   ADL's / Homemaking Assistance Needed: husband helps with some ADLs including showering, IADLs  Comments: RW for household distances     Hand Dominance        Extremity/Trunk Assessment   Upper Extremity Assessment Upper Extremity Assessment: Defer to OT evaluation    Lower Extremity Assessment Lower Extremity Assessment: Generalized weakness    Cervical / Trunk Assessment Cervical / Trunk Assessment: Kyphotic  Communication   Communication: No difficulties(has hearing aides per previous note )  Cognition Arousal/Alertness: Awake/alert Behavior During Therapy: WFL for tasks assessed/performed Overall Cognitive Status: Within Functional Limits for tasks assessed                                        General Comments General comments (skin integrity, edema, etc.): patient reports she is fairly close to her baseline level of function but does not fully feel back to her PLOF     Exercises     Assessment/Plan    PT Assessment Patient needs continued PT services  PT Problem List Decreased strength;Decreased mobility;Decreased safety awareness;Decreased coordination;Decreased activity tolerance;Cardiopulmonary status limiting activity;Decreased balance;Decreased knowledge of use of DME;Pain       PT Treatment Interventions DME instruction;Therapeutic activities;Gait training;Therapeutic exercise;Patient/family education;Stair training;Balance training;Neuromuscular re-education;Functional mobility training    PT Goals (Current goals can be found in the Care Plan section)  Acute Rehab PT Goals Patient Stated Goal: to go home PT Goal  Formulation: With patient Time For Goal Achievement: 10/08/17 Potential to Achieve Goals: Good    Frequency Min 3X/week   Barriers to discharge        Co-evaluation               AM-PAC PT "6 Clicks" Daily Activity  Outcome Measure Difficulty turning over in bed (including adjusting bedclothes, sheets and blankets)?: Unable Difficulty moving from lying on back to sitting on the side of the bed? : Unable Difficulty sitting down on and standing up from a chair with arms (e.g., wheelchair, bedside commode, etc,.)?: Unable Help needed moving to and from a bed to chair (including a wheelchair)?: A Little Help needed walking in hospital room?: A Little Help needed climbing 3-5 steps with a railing? : A Lot 6 Click Score: 11    End of Session Equipment Utilized During Treatment: Gait belt;Oxygen Activity Tolerance: Patient tolerated treatment well Patient left: in chair;with chair alarm set;with call bell/phone within reach Nurse Communication: Mobility status(RN present for part of session ) PT Visit Diagnosis: Unsteadiness on feet (R26.81);Muscle weakness (generalized) (M62.81);Difficulty in walking, not elsewhere classified (R26.2)    Time: 6283-6629 PT Time Calculation (min) (ACUTE ONLY): 27 min   Charges:   PT Evaluation $PT Eval Moderate Complexity: 1 Mod PT Treatments $Therapeutic Activity: 8-22 mins   PT G Codes:        Cyril Mourning  Hanley Hays, DPT, [place]  Supplemental Physical Therapist [facility]   Pager [phone]

## 2017-09-25 ENCOUNTER — Inpatient Hospital Stay (HOSPITAL_COMMUNITY): Payer: PPO

## 2017-09-25 DIAGNOSIS — I8510 Secondary esophageal varices without bleeding: Secondary | ICD-10-CM

## 2017-09-25 DIAGNOSIS — K769 Liver disease, unspecified: Secondary | ICD-10-CM

## 2017-09-25 DIAGNOSIS — I5032 Chronic diastolic (congestive) heart failure: Secondary | ICD-10-CM

## 2017-09-25 DIAGNOSIS — N183 Chronic kidney disease, stage 3 (moderate): Secondary | ICD-10-CM

## 2017-09-25 DIAGNOSIS — R1011 Right upper quadrant pain: Secondary | ICD-10-CM

## 2017-09-25 DIAGNOSIS — R221 Localized swelling, mass and lump, neck: Secondary | ICD-10-CM

## 2017-09-25 LAB — COMPREHENSIVE METABOLIC PANEL
ALK PHOS: 131 U/L — AB (ref 38–126)
ALT: 21 U/L (ref 14–54)
ANION GAP: 11 (ref 5–15)
AST: 24 U/L (ref 15–41)
Albumin: 2.5 g/dL — ABNORMAL LOW (ref 3.5–5.0)
BUN: 26 mg/dL — ABNORMAL HIGH (ref 6–20)
CO2: 35 mmol/L — AB (ref 22–32)
Calcium: 8.7 mg/dL — ABNORMAL LOW (ref 8.9–10.3)
Chloride: 94 mmol/L — ABNORMAL LOW (ref 101–111)
Creatinine, Ser: 1.36 mg/dL — ABNORMAL HIGH (ref 0.44–1.00)
GFR calc Af Amer: 45 mL/min — ABNORMAL LOW (ref >60)
GFR calc non Af Amer: 38 mL/min — ABNORMAL LOW (ref >60)
GLUCOSE: 130 mg/dL — AB (ref 65–99)
Potassium: 3.8 mmol/L (ref 3.5–5.1)
Sodium: 140 mmol/L (ref 135–145)
Total Bilirubin: 1 mg/dL (ref 0.3–1.2)
Total Protein: 4.8 g/dL — ABNORMAL LOW (ref 6.5–8.1)

## 2017-09-25 LAB — CBC
HCT: 28.4 % — ABNORMAL LOW (ref 36.0–46.0)
HEMOGLOBIN: 8.5 g/dL — AB (ref 12.0–15.0)
MCH: 31 pg (ref 26.0–34.0)
MCHC: 29.9 g/dL — AB (ref 30.0–36.0)
MCV: 103.6 fL — AB (ref 78.0–100.0)
Platelets: 78 10*3/uL — ABNORMAL LOW (ref 150–400)
RBC: 2.74 MIL/uL — ABNORMAL LOW (ref 3.87–5.11)
RDW: 16 % — ABNORMAL HIGH (ref 11.5–15.5)
WBC: 5.5 10*3/uL (ref 4.0–10.5)

## 2017-09-25 MED ORDER — METOLAZONE 5 MG PO TABS
5.0000 mg | ORAL_TABLET | Freq: Two times a day (BID) | ORAL | Status: DC
  Administered 2017-09-25 – 2017-09-28 (×6): 5 mg via ORAL
  Filled 2017-09-25 (×6): qty 1

## 2017-09-25 NOTE — Progress Notes (Signed)
PROGRESS NOTE    [name]  HUD:[id number] DOB: [birth year] DOA: 09/16/2017 PCP: Unk Pinto, MD   Brief Narrative:  72 y.o.WF PMHx Depression ,Diabetes mellitus without complication (diet controlled) COPD on 2-3 L O2 at home, non-alcohol related liver cirrhosis, Esophageal varices,Hepatic encephalopathy , and Hepatocellular Carcinoma diagnosed Nov 2018 s/p CT guided ablation with hospitalization 2/2 >>> 09/13/17 due to anasarca and Acute on Chronic Respiratory failure s/p thoracentesis Asthma, Fibromyalgia, HLD, HTN, IBS,Splenomegaly, Vitamin D deficiency who returned to the ED 2/11 with recurrent respiratory failure.  CXR showed re-accumulation of a right-sided pleural effusion..       Subjective: 2/20 A/O 4, positive SOB (improved), positive abdominal distention (improved after BM), positive abdominal pain. Negative CP. Positive NEW right neck pain with swelling.      Assessment & Plan:   Principal Problem:   Acute on chronic respiratory failure with hypoxemia (HCC) Active Problems:   Anemia   Cirrhosis (HCC)   Essential hypertension   Goals of care, counseling/discussion   Chronic respiratory failure (HCC)   Obesity (BMI 30-39.9)   COPD exacerbation (HCC)   Anasarca   Hepatocellular carcinoma (HCC)   Ascites   Pleural effusion   Palliative care by specialist  Acute on Chronic Respiratory Failure with CHYIFOY@@@@ -discharged on 2/8  On 4L O2 via [place]. Currently on 5 L O2 via [place] at rest -Multifactorial COPD exacerbation, positive RSV pneumonia, HCAP, recurrent RIGHT pleural effusion, ascites.  -Titrate O2 to maintain SPO2 89-93%  -CPAP/BiPAP  QHS PER RESPIRATORY TO MAINTAIN SPO2 89 -93%  -See RSV pneumonia  COPD exacerbation@@@ -See respiratory failure See respiratory failure  DXA/JOI@@@@ -CPAP per respiratory -Patient requires Trilogy prior to discharge. Patient at high risk of early readmission without Trilogy machine. Unlikely remain out of the hospital long  enough for sleep study without machine. -Titrate O2 to maintain SPO2 89-93% -Patient continues to exhibit signs of hypercapnia associated with chronic respiratory failure secondary to severe COPD. Patient requires the use of an IV both QHS and daytime to help with exacerbation periods. The use of the in IV Will treat patient's high PCO2 levels and can reduce risk of exacerbations and future hospitalizations when used at night and during the day. Patient will need these advanced settings in conjunction with her current medication regimen: BiPAP is not an option due to his functional limitations and the severity of the patient's condition. Failure to have in IV available for use over 24 hour period could lead to death.  RLL pneumonia (HCAP)//positive RSV pneumonia -2/13 PCXR consistent with RLL pneumonia. Although patient negative leukocytosis patient is immunocompromised with HCC/liver failure may not mount immune response. Given that second hospitalization within 7 days. Start pneumonia protocol. 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 -S/P RIGHT Thoracentesis: 900 ml aspirated.Pathology negative for malignant cells./culture and stain pending -GI DOES NOT recommend  TIPS AT THIS TIME. ON 2/17 SIGNED OFF.   -Pathology: No malignant cells  Nonalcoholic Liver Cirrhosis /[place]/Hepatocellular carcinoma -ammonia level daily -lactulose 10 g daily  Ascites -2/17 S/P paracentesis 2.6 L  Liver lesions (2) suspicious for [facility].  -Oncologist Dr. Truitt Merle per EMR last saw patient 07/10/2017 -08/30/2017 S/P CT guided Percutaneous Radiofrequency (MICROWAVE) Ablation of segment 6 Hepatic lesion -Per oncology note not candidate for surgery for liver transplant secondary to comorbidities -Per oncology patient  should follow-up in 6-12 months  post IR procedure.    Esophageal Varices, Hepatic Encephalopathy( Child Pugh score 6 (class A) -f/u with Dr. Enis Gash      Right neck [phone] -Patient with new onset C-spine/right soft tissue swelling obtain C-spine and neck soft tissue x-ray.  Chronic diastolic CHF (base weight 84.5 kg at discharge on 2/8) -Strict in and out since admission -169 -Daily weight Filed Weights   09/23/17 0442 09/24/17 0439 09/25/17 0408  Weight: 194 lb 0.1 oz (88 kg) 192 lb 3.9 oz (87.2 kg) 194 lb 0.1 oz (88 kg)  -Lasix 60 mg TID + Zaroxolyn 5 mg BID  Macrocytic anemia -B-12/folate pending    Macrocytic anemia -Check F74 and folic acid    CKD stage III (baseline Cr 1.3) -Follow Cr closely with diuretics Recent Labs  Lab 09/19/17 1420 09/20/17 0406 09/21/17 0502 09/22/17 0250 09/23/17 0542 09/24/17 0218 09/25/17 0304  CREATININE 1.24* 1.14* 1.03* 1.32* 1.30* 1.31* 1.36*  -Stable   Obesity -Body mass index is 36.09 kg/m    GOALS of care -PALLIATIVE CARE consult: Hepatocellular carcinoma, recurrent pleural effusion, esophageal varices discuss short-term vs long-term goals of care vs hospice     DVT prophylaxis: SCD Code Status: dO NOT RESUSCITATE Family Communication: husband at bedside for discussion of plan care Disposition Plan: SNF vs home with hospice   Consultants:  IR   Procedures/Significant Events:  2/8 d/c  2/11 re-admit w/ rapidly re-accumulated pleural effusion  2/12 S/P RIGHT Thoracentesis 900 ml aspirated 2/17 paracentesis in Radiology - 2.6L      I have personally reviewed and interpreted all radiology studies and my findings are as above.  VENTILATOR SETTINGS:    Cultures 2/11 MRSA by PCR negative 2/12 RIGHT pleural fluid negative malignant cells: Culture pending 2/13 respiratory virus panel pending 2/13 sputum pending 2/13 HIV negative 2/13 strep pneumo pending 2/13 respiratory virus panel positive  RSV    Antimicrobials: Anti-infectives (From admission, onward)   Start     Stop   09/19/17 0100  vancomycin (VANCOCIN) IVPB 750 mg/150 ml premix         09/18/17 1230  ceFEPIme (MAXIPIME) 2 g in sodium chloride 0.9 % 100 mL IVPB     09/26/17 1229   09/18/17 1215  vancomycin (VANCOCIN) 1,750 mg in sodium chloride 0.9 % 500 mL IVPB         09/16/17 1700  rifampin (RIFADIN) capsule 300 mg             Devices    LINES / TUBES:      Continuous Infusions: . furosemide Stopped (09/25/17 0634)     Objective: Vitals:   09/25/17 0408 09/25/17 0556 09/25/17 0804 09/25/17 0805  BP: (!) 111/43 109/63 (!) 112/52   Pulse: 84 87 84   Resp: 13 19 19    Temp: 98.8 F (37.1 C)     TempSrc: Axillary     SpO2: 94% 94% 94% 94%  Weight: 194 lb 0.1 oz (88 kg)     Height:        Intake/Output Summary (Last 24 hours) at 09/25/2017 0807 Last data filed at 09/25/2017 0800 Gross per 24 hour  Intake 1100 ml  Output 50 ml  Net 1050 ml   Filed Weights   09/23/17 0442 09/24/17 0439 09/25/17 0408  Weight: 194 lb 0.1 oz (88 kg) 192 lb 3.9 oz (87.2 kg) 194 lb 0.1 oz (88 kg)    Physical Exam:  General: A/O x 4, positive acute on chronic respiratory distress  (improving  still requiring HFNC) Neck:  Negative scars, masses, torticollis, lymphadenopathy, JVD Lungs: tachypneic, diffuse expiratory wheeze, negative crackles Cardiovascular: Tachycardic, Regular  rhythm without murmur gallop or rub normal S1 and S2 Abdomen: Obese, positive RUQ abdominal pain to palpation, positive distention,, positive soft, bowel sounds, no rebound, positive ascites, no appreciable mass Extremities: No significant cyanosis, clubbing, or edema bilateral lower extremities Skin: Negative rashes, lesions, ulcers Psychiatric:  Negative depression, negative anxiety, negative fatigue, negative mania  Central nervous system:  Cranial nerves II through XII intact, tongue/uvula midline, all extremities muscle strength  5/5, sensation intact throughout,  negative dysarthria, negative expressive aphasia, negative receptive aphasia. .     Data Reviewed: Care during the described time interval was provided by me .  I have reviewed this patient's available data, including medical history, events of note, physical examination, and all test results as part of my evaluation.   CBC: Recent Labs  Lab 09/19/17 1420 09/20/17 0406 09/21/17 0502 09/23/17 0542 09/23/17 1518 09/25/17 0304  WBC 5.9 3.9* 5.5 5.1 7.4 5.5  NEUTROABS 5.0  --   --   --   --   --   HGB 9.3* 8.9* 9.3* 8.8* 8.9* 8.5*  HCT 30.8* 29.8* 32.0* 29.1* 29.5* 28.4*  MCV 108.8* 108.0* 107.7* 105.8* 104.6* 103.6*  PLT 104* 89* 83* 77* 88* 78*   Basic Metabolic Panel: Recent Labs  Lab 09/19/17 1420 09/20/17 0406 09/21/17 0502 09/22/17 0250 09/23/17 0542 09/24/17 0218 09/25/17 0304  NA 141 141 141 138 141 141 140  K 4.1 4.5 4.5 4.2 4.0 4.3 3.8  CL 100* 101 97* 96* 96* 96* 94*  CO2 30 30 32 32 33* 34* 35*  GLUCOSE 178* 153* 121* 200* 98 145* 130*  BUN 21* 21* 23* 25* 26* 25* 26*  CREATININE 1.24* 1.14* 1.03* 1.32* 1.30* 1.31* 1.36*  CALCIUM 8.5* 8.5* 8.5* 8.6* 8.6* 9.0 8.7*  MG 2.0 2.0  --   --   --   --   --    GFR: Estimated Creatinine Clearance: 39.7 mL/min (A) (by C-G formula based on SCr of 1.36 mg/dL (H)). Liver Function Tests: Recent Labs  Lab 09/19/17 1420 09/21/17 0502 09/23/17 0542 09/25/17 0304  AST 35 27 23 24   ALT 26 26 22 21   ALKPHOS 114 152* 134* 131*  BILITOT 1.5* 1.3* 1.2 1.0  PROT 5.1* 5.0* 4.6* 4.8*  ALBUMIN 2.8* 2.5* 2.4* 2.5*   No results for input(s): LIPASE, AMYLASE in the last 168 hours. No results for input(s): AMMONIA in the last 168 hours. Coagulation Profile: Recent Labs  Lab 09/23/17 0542  INR 1.24   Cardiac Enzymes: No results for input(s): CKTOTAL, CKMB, CKMBINDEX, TROPONINI in the last 168 hours. BNP (last 3 results) No results for input(s): PROBNP in the last 8760 hours. HbA1C: No  results for input(s): HGBA1C in the last 72 hours. CBG: No results for input(s): GLUCAP in the last 168 hours. Lipid Profile: No results for input(s): CHOL, HDL, LDLCALC, TRIG, CHOLHDL, LDLDIRECT in the last 72 hours. Thyroid Function Tests: No results for input(s): TSH, T4TOTAL, FREET4, T3FREE, THYROIDAB in the last 72 hours. Anemia Panel: No results for input(s): VITAMINB12, FOLATE, FERRITIN, TIBC, IRON, RETICCTPCT in the last 72 hours. Urine analysis:    Component Value Date/Time   COLORURINE YELLOW 09/16/2017 1922   APPEARANCEUR CLEAR 09/16/2017 1922   LABSPEC 1.008 09/16/2017 1922   PHURINE 5.0 09/16/2017 [place] 09/16/2017 1922   HGBUR NEGATIVE 09/16/2017 1922   BILIRUBINUR NEGATIVE  09/16/2017 [place] 09/16/2017 [place] NEGATIVE 09/16/2017 1922   UROBILINOGEN 1 01/13/2015 1145   NITRITE NEGATIVE 09/16/2017 1922   LEUKOCYTESUR TRACE (A) 09/16/2017 1922   Sepsis Labs: @LABRCNTIP (procalcitonin:4,lacticidven:4)  ) Recent Results (from the past 240 hour(s))  MRSA PCR Screening     Status: None   Collection Time: 09/16/17 11:37 PM  Result Value Ref Range Status   MRSA by PCR NEGATIVE NEGATIVE Final    Comment:        The GeneXpert MRSA Assay (FDA approved for NASAL specimens only), is one component of a comprehensive MRSA colonization surveillance program. It is not intended to diagnose MRSA infection nor to guide or monitor treatment for MRSA infections. Performed at [facility], [place] [address], [place], [place] [address]   Body fluid culture     Status: None   Collection Time: 09/17/17  1:00 PM  Result Value Ref Range Status   Specimen Description PLEURAL  Final   Special Requests LEFT  Final   Gram Stain   Final    RARE WBC PRESENT, PREDOMINANTLY PMN NO ORGANISMS SEEN    Culture   Final    No growth aerobically or anaerobically. Performed at [facility], [place] [address], [place], [place] [address]      Report Status 09/21/2017 FINAL  Final  Culture, blood (routine x 2) Call MD if unable to obtain prior to antibiotics being given     Status: None   Collection Time: 09/18/17 12:20 PM  Result Value Ref Range Status   Specimen Description BLOOD LEFT HAND  Final   Special Requests   Final    BOTTLES DRAWN AEROBIC AND ANAEROBIC Blood Culture adequate volume   Culture   Final    NO GROWTH 5 DAYS Performed at [facility], [place] [address] [place], [place] [address]    Report Status 09/23/2017 FINAL  Final  Culture, blood (routine x 2) Call MD if unable to obtain prior to antibiotics being given     Status: None   Collection Time: 09/18/17 12:30 PM  Result Value Ref Range Status   Specimen Description BLOOD RIGHT HAND  Final   Special Requests   Final    BOTTLES DRAWN AEROBIC AND ANAEROBIC Blood Culture adequate volume   Culture   Final    NO GROWTH 5 DAYS Performed at [facility], [place] [address] [place], [place] [address]    Report Status 09/23/2017 FINAL  Final  Respiratory Panel by PCR     Status: Abnormal   Collection Time: 09/18/17  2:08 PM  Result Value Ref Range Status   Adenovirus NOT DETECTED NOT DETECTED Final   Coronavirus 229E NOT DETECTED NOT DETECTED Final   Coronavirus HKU1 NOT DETECTED NOT DETECTED Final   Coronavirus NL63 NOT DETECTED NOT DETECTED Final   Coronavirus OC43 NOT DETECTED NOT DETECTED Final   Metapneumovirus NOT DETECTED NOT DETECTED Final   Rhinovirus / Enterovirus NOT DETECTED NOT DETECTED Final   Influenza A NOT DETECTED NOT DETECTED Final   Influenza B NOT DETECTED NOT DETECTED Final   Parainfluenza Virus 1 NOT DETECTED NOT DETECTED Final   Parainfluenza Virus 2 NOT DETECTED NOT DETECTED Final   Parainfluenza Virus 3 NOT DETECTED NOT DETECTED Final   Parainfluenza Virus 4 NOT DETECTED NOT DETECTED Final   Respiratory Syncytial Virus DETECTED (A) NOT DETECTED Final    Comment: CRITICAL RESULT CALLED TO, READ BACK BY AND  VERIFIED  WITH: HS Truman Hayward RN 17:25 09/18/17 ([email or web address])    Bordetella pertussis NOT DETECTED NOT DETECTED Final   Chlamydophila pneumoniae NOT DETECTED NOT DETECTED Final   Mycoplasma pneumoniae NOT DETECTED NOT DETECTED Final    Comment: Performed at [facility], [place] [address], [place], [place] [address]  Culture, body fluid-bottle     Status: None (Preliminary result)   Collection Time: 09/22/17  2:01 PM  Result Value Ref Range Status   Specimen Description PERITONEAL  Final   Special Requests BOTTLES DRAWN AEROBIC AND ANAEROBIC  Final   Culture   Final    NO GROWTH 2 DAYS Performed at [facility], [place] [address], [place], [place] [address]    Report Status PENDING  Incomplete  Gram stain     Status: None   Collection Time: 09/22/17  2:01 PM  Result Value Ref Range Status   Specimen Description PERITONEAL  Final   Special Requests NONE  Final   Gram Stain   Final    NO WBC SEEN NO ORGANISMS SEEN Performed at [facility], [address] [place], [place] [address]    Report Status 09/22/2017 FINAL  Final         Radiology Studies: Dg Abd Portable 1v  Result Date: 09/23/2017 CLINICAL DATA:  Abdominal pain.  History of liver cancer. EXAM: PORTABLE ABDOMEN - 1 VIEW COMPARISON:  None. FINDINGS: Nondistended gas-filled loops of small bowel and colon are noted. No dilated bowel loops are present. No suspicious calcifications are identified. Lumbar spine surgical changes/vertebral augmentation and bilateral hip arthroplasties noted. IMPRESSION: Nonspecific nonobstructive bowel gas pattern. Electronically Signed   By: Margarette Canada M.D.   On: 09/23/2017 16:46        Scheduled Meds: . allopurinol  300 mg Oral Daily  . bisoprolol  10 mg Oral Q supper  . budesonide  0.25 mg Nebulization BID  . chlorhexidine  15 mL Mouth Rinse BID  . citalopram  20 mg Oral Q supper  . gabapentin  400 mg Oral TID  . ipratropium-albuterol  3 mL Nebulization TID  . lactulose  10 g Oral BID   . mouth rinse  15 mL Mouth Rinse q12n4p  . polyethylene glycol  17 g Oral Daily  . potassium chloride  20 mEq Oral 2 times per day  . rifampin  300 mg Oral BID WC  . sodium chloride flush  10-40 mL Intracatheter Q12H  . spironolactone  150 mg Oral Daily   Continuous Infusions: . furosemide Stopped (09/25/17 0634)     LOS: 9 days    Time spent: 40 minutes    Natthew Marlatt, Geraldo Docker, MD [facility] Pager [phone]   If 7PM-7AM, please contact night-coverage [email or web address] Password [email or web address] 09/25/2017, 8:07 AM

## 2017-09-25 NOTE — Care Management Note (Signed)
Case Management Note  Patient Details  Name: [name] MRN: [id number] Date of Birth: [birth year]  Subjective/Objective:   Pt admitted with pleural effusion    Action/Plan:  Patient is from home w spouse. Pt already has neb, RW, electric scooter, and O2 (3Liters) through [facility].    Pt is already active with [facility] for  [facility] and HHPT - agency aware of admit. CM will need to ensure that at time of discharge pt has portable oxygen tank in addition to ensuring resumption for [facility] orders are written.    Expected Discharge Date:                  Expected Discharge Plan:     In-House Referral:     Discharge planning Services     Post Acute Care Choice:    Choice offered to:     DME Arranged:    DME Agency:     HH Arranged:    HH Agency:     Status of Service:     If discussed at [employer], dates discussed:    Additional Comments: 09/25/2017  Palliative to re consult with pt and husband for [place]. Trilogy referral given to [facility] per Dr Sherral Hammers. Attending aware that if pt discharges home with hospice - pt will not be accepted with Trilogy and not likely with BIPAP   If pt is to discharge home would benefit from [facility] program or possibly [facility] if approrpiate [name], RN 09/25/2017, 11:59 AM

## 2017-09-25 NOTE — Progress Notes (Signed)
Palliative NP will f/u with patient and family tomorrow, 09/26/17.  NO CHARGE  Ihor Dow, FNP-C Palliative Medicine Team  Phone: [phone] Fax: [phone]

## 2017-09-26 DIAGNOSIS — R0902 Hypoxemia: Secondary | ICD-10-CM

## 2017-09-26 DIAGNOSIS — K7581 Nonalcoholic steatohepatitis (NASH): Secondary | ICD-10-CM

## 2017-09-26 MED ORDER — LACTULOSE 10 GM/15ML PO SOLN
10.0000 g | Freq: Every day | ORAL | Status: DC
  Administered 2017-09-27 – 2017-09-28 (×2): 10 g via ORAL
  Filled 2017-09-26 (×2): qty 15

## 2017-09-26 MED ORDER — OXYCODONE HCL 5 MG PO TABS
5.0000 mg | ORAL_TABLET | ORAL | Status: DC | PRN
  Administered 2017-09-26 – 2017-09-27 (×2): 5 mg via ORAL
  Filled 2017-09-26 (×2): qty 1

## 2017-09-26 NOTE — Progress Notes (Signed)
Palliative Medicine RN Note: I spoke with Cheri with [facility]. We discussed Ms Nobis and her need for bipap versus Trilogy in the setting of an inability to have a sleep study. No sleep study is needed for patients who are going home with hospice, as the equipment is covered under the Medicare hospice benefit when it is used for comfort (just as they do not have O2 sat requirements when having home O2 on hospice). However, Trilogy vents are not covered by hospice no matter the settings on the vent. [facility] CAN accommodate bipaps without a sleep study, and Cheri verbalized that bipap is a comfort measure and thus would be provided by them.  Cheri and I discussed the case, and I updated RNCM Sam, who will make an official referral.   Marjie Skiff. Dajia Gunnels, RN, BSN, [facility] Palliative Medicine Team 09/26/2017 4:43 PM Office [phone]

## 2017-09-26 NOTE — Progress Notes (Signed)
PT Cancellation Note  Patient Details Name: [name] MRN: [id number] DOB: [birth year]   Cancelled Treatment:    Reason Eval/Treat Not Completed: Other (comment). MD cancelled PT/OT orders. Please reorder if the situation changes. PT will sign-off at this time.  Mabeline Caras, PT, DPT Acute Rehab Services  Pager: [place] 09/26/2017, 1:26 PM

## 2017-09-26 NOTE — Progress Notes (Signed)
OT Cancellation/Discontinue Note  Patient Details Name: ARAIYA TILMON MRN: [id number] DOB: [birth year]   Cancelled Treatment:    Reason Eval/Treat Not Completed: Other (comment)(MD discontinued OT orders). Thank you for the opportunity to serve this patient and their family, should anything about the situation change please feel free to re-order OT services. OT to sign off at this time.   [place] 09/26/2017, 1:18 PM  Hulda Humphrey OTR/L [phone]

## 2017-09-26 NOTE — Care Management Note (Addendum)
Case Management Note  Patient Details  Name: KEYONA EMRICH MRN: [id number] Date of Birth: 15-Sep-1945  Subjective/Objective:   Pt admitted with pleural effusion    Action/Plan:  Patient is from home w spouse. Pt already has neb, RW, electric scooter, and O2 (3Liters) through [facility].    Pt is already active with [facility] for  [facility] and HHPT - agency aware of admit. CM will need to ensure that at time of discharge pt has portable oxygen tank in addition to ensuring resumption for [facility] orders are written.    Expected Discharge Date:                  Expected Discharge Plan:     In-House Referral:     Discharge planning Services     Post Acute Care Choice:    Choice offered to:     DME Arranged:    DME Agency:     HH Arranged:    HH Agency:     Status of Service:     If discussed at [employer], dates discussed:    Additional Comments: 09/26/2017  [facility] reached out to [facility] has agreed to review pts chart to determine if they will accept pt and provide BIPAP - agency to follow up with CM Friday am  CM received consult for home with hospice - however per attending pt will need continuous triology at night with PRN need during the day at home.  Palliative NP has reached out the [facility] to request acceptance of pt with trilogy - CM has been informed by multiple home with hospice agencies that home hospice will not accept trilogy nor BIPAP (BIPAP is not in formulary).  CM also reached out to [facility] program however pts insurance in not accepted for program - Alvis Lemmings will provide [facility] if needed.  [facility] will provide more supervision that regular home health - this may be an option is pt goes home without hospice.  All information shared with attending  09/25/17 Palliative to re consult with pt and husband for [place]. Trilogy referral given to [facility] per Dr Sherral Hammers. Attending aware that if pt discharges home with hospice - pt will not  be accepted with Trilogy and not likely with BIPAP   If pt is to discharge home would benefit from [facility] program or possibly [facility] First if approrpiate [name], RN 09/26/2017, 2:09 PM

## 2017-09-26 NOTE — Progress Notes (Signed)
PROGRESS NOTE    IXEL BOEHNING  RCV:[id number] DOB: 15-Mar-1946 DOA: 09/16/2017 PCP: Unk Pinto, MD   Brief Narrative:  72 y.o.WF PMHx Depression ,Diabetes mellitus without complication (diet controlled) COPD on 2-3 L O2 at home, non-alcohol related liver cirrhosis, Esophageal varices,Hepatic encephalopathy , and Hepatocellular Carcinoma diagnosed Nov 2018 s/p CT guided ablation with hospitalization 2/2 >>> 09/13/17 due to anasarca and Acute on Chronic Respiratory failure s/p thoracentesis Asthma, Fibromyalgia, HLD, HTN, IBS,Splenomegaly, Vitamin D deficiency who returned to the ED 2/11 with recurrent respiratory failure.  CXR showed re-accumulation of a right-sided pleural effusion..       Subjective: 2/21 A/O 4, positive SOB (improved), positive abdominal distention. Positive abdominal pain. Negative CP, positive new right neck pain with swelling. Patient extremely anxious to be discharged home.        Assessment & Plan:   Principal Problem:   Acute on chronic respiratory failure with hypoxemia (HCC) Active Problems:   Anemia   Cirrhosis (HCC)   Essential hypertension   Goals of care, counseling/discussion   Chronic respiratory failure (HCC)   Obesity (BMI 30-39.9)   COPD exacerbation (HCC)   Anasarca   Hepatocellular carcinoma (HCC)   Ascites   Pleural effusion   Palliative care by specialist  Acute on Chronic Respiratory Failure with Hypoxia -discharged on 2/8  On 4L O2 via [place]. Currently on 5 L O2 via [place] at rest -Multifactorial COPD exacerbation, positive RSV pneumonia, HCAP, recurrent RIGHT pleural effusion, ascites.  -Titrate O2 to maintain SPO2 89-93%  -CPAP/BiPAP  QHS PER RESPIRATORY TO MAINTAIN SPO2 89 -93%  -See RSV pneumonia  COPD exacerbation -See respiratory failure See respiratory failure  OSA/OHS -CPAP per respiratory -Patient requires Trilogy prior to discharge. Patient at high risk of early readmission without Trilogy machine. Unlikely remain out  of the hospital long enough for sleep study without machine. -Titrate O2 to maintain SPO2 89-93% -Patient continues to exhibit signs of hypercapnia associated with chronic respiratory failure secondary to severe COPD. Patient requires the use of an IV both QHS and daytime to help with exacerbation periods. The use of the in IV Will treat patient's high PCO2 levels and can reduce risk of exacerbations and future hospitalizations when used at night and during the day. Patient will need these advanced settings in conjunction with her current medication regimen: BiPAP is not an option due to his functional limitations and the severity of the patient's condition. Failure to have in IV available for use over 24 hour period could lead to death.  RLL pneumonia (HCAP)//positive RSV pneumonia -2/13 PCXR consistent with RLL pneumonia. Although patient negative leukocytosis patient is immunocompromised with HCC/liver failure may not mount immune response. Given that second hospitalization within 7 days. Start pneumonia protocol. 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 -S/P RIGHT Thoracentesis: 900 ml aspirated.Pathology negative for malignant cells./culture and stain pending -GI DOES NOT recommend  TIPS AT THIS TIME. ON 2/17 SIGNED OFF.   -Pathology: No malignant cells  Nonalcoholic Liver Cirrhosis /[place]/Hepatocellular carcinoma -Check ammonia level sporadically to ensure not becoming elevated.  -Lactulose 10 g daily   Ascites -2/17 S/P paracentesis 2.6 L  Liver lesions (2) suspicious for [facility].  -Oncologist Dr. Truitt Merle per EMR last saw patient 07/10/2017 -08/30/2017 S/P CT guided Percutaneous Radiofrequency (MICROWAVE) Ablation of segment 6 Hepatic lesion -Per oncology note not candidate for surgery for  liver transplant secondary to comorbidities -Per oncology patient should follow-up in 6-12 months post IR procedure.    Esophageal Varices, Hepatic Encephalopathy( Child Pugh score 6 (class A) -f/u with Dr. Enis Gash      Right neck mass -Patient with new onset C-spine/right soft tissue swelling. X-ray shows negative discrete soft tissue abnormality.  Chronic diastolic CHF (base weight 84.5 kg at discharge on 2/8) -Strict in and out since admission -2.1 L -Daily weight Filed Weights   09/24/17 0439 09/25/17 0408 09/26/17 0432  Weight: 192 lb 3.9 oz (87.2 kg) 194 lb 0.1 oz (88 kg) 192 lb 0.3 oz (87.1 kg)  -Lasix 60 mg TID + Zaroxolyn 5 mg BID -2/22 PCXR pending  Macrocytic anemia -B-12/folate pending    Macrocytic anemia -Check D98 and folic acid    CKD stage III (baseline Cr 1.3) -Follow Cr closely with diuretics Recent Labs  Lab 09/19/17 1420 09/20/17 0406 09/21/17 0502 09/22/17 0250 09/23/17 0542 09/24/17 0218 09/25/17 0304  CREATININE 1.24* 1.14* 1.03* 1.32* 1.30* 1.31* 1.36*  -Stable   Obesity -Body mass index is 36.09 kg/m    GOALS of care -PALLIATIVE CARE consult: Hepatocellular carcinoma, recurrent pleural effusion, esophageal varices discuss short-term vs long-term goals of care vs hospice     DVT prophylaxis: SCD Code Status: dO NOT RESUSCITATE Family Communication: husband at bedside for discussion of plan care Disposition Plan: SNF vs home with hospice   Consultants:  IR   Procedures/Significant Events:  2/8 d/c  2/11 re-admit w/ rapidly re-accumulated pleural effusion  2/12 S/P RIGHT Thoracentesis 900 ml aspirated 2/17 paracentesis in Radiology - 2.6L      I have personally reviewed and interpreted all radiology studies and my findings are as above.  VENTILATOR SETTINGS:    Cultures 2/11 MRSA by PCR negative 2/12 RIGHT pleural fluid negative malignant cells: Culture pending 2/13 respiratory virus panel pending 2/13 sputum  pending 2/13 HIV negative 2/13 strep pneumo pending 2/13 respiratory virus panel positive RSV    Antimicrobials: Anti-infectives (From admission, onward)   Start     Stop   09/19/17 0100  vancomycin (VANCOCIN) IVPB 750 mg/150 ml premix         09/18/17 1230  ceFEPIme (MAXIPIME) 2 g in sodium chloride 0.9 % 100 mL IVPB     09/26/17 1229   09/18/17 1215  vancomycin (VANCOCIN) 1,750 mg in sodium chloride 0.9 % 500 mL IVPB         09/16/17 1700  rifampin (RIFADIN) capsule 300 mg             Devices    LINES / TUBES:      Continuous Infusions: . furosemide 100 mg (09/26/17 0621)     Objective: Vitals:   09/26/17 0432 09/26/17 0735 09/26/17 0737 09/26/17 0742  BP:  (!) 114/46    Pulse:  90    Resp:  13    Temp:  98.3 F (36.8 C)    TempSrc:  Oral    SpO2:  94% 94% 94%  Weight: 192 lb 0.3 oz (87.1 kg)     Height:        Intake/Output Summary (Last 24 hours) at 09/26/2017 0940 Last data filed at 09/26/2017 0900 Gross per 24 hour  Intake 1620 ml  Output 2350 ml  Net -730 ml   Filed Weights   09/24/17 0439 09/25/17 0408 09/26/17 0432  Weight: 192 lb 3.9 oz (87.2 kg) 194 lb 0.1 oz (88 kg) 192 lb 0.3 oz (87.1 kg)  Physical Exam:  General: A/O 4, acute on chronic respiratory distress (improved) Neck:  Negative scars, masses, torticollis, lymphadenopathy, JVD Lungs: tachypnea positive diffuse expiratory wheeze, negative crackles Cardiovascular: Tachycardic, Regular rhythm without murmur gallop or rub normal S1 and S2 Abdomen: Obese, positive abdominal pain, positive distention, positive soft, bowel sounds, no rebound, no ascites, no appreciable mass Extremities: No significant cyanosis, clubbing, or edema bilateral lower extremities Skin: Negative rashes, lesions, ulcers Psychiatric:  Negative depression, negative anxiety, negative fatigue, negative mania  Central nervous system:  Cranial nerves II through XII intact, tongue/uvula midline, all extremities  muscle strength 5/5, sensation intact throughout, negative dysarthria, negative expressive aphasia, negative receptive aphasia.    Data Reviewed: Care during the described time interval was provided by me .  I have reviewed this patient's available data, including medical history, events of note, physical examination, and all test results as part of my evaluation.   CBC: Recent Labs  Lab 09/19/17 1420 09/20/17 0406 09/21/17 0502 09/23/17 0542 09/23/17 1518 09/25/17 0304  WBC 5.9 3.9* 5.5 5.1 7.4 5.5  NEUTROABS 5.0  --   --   --   --   --   HGB 9.3* 8.9* 9.3* 8.8* 8.9* 8.5*  HCT 30.8* 29.8* 32.0* 29.1* 29.5* 28.4*  MCV 108.8* 108.0* 107.7* 105.8* 104.6* 103.6*  PLT 104* 89* 83* 77* 88* 78*   Basic Metabolic Panel: Recent Labs  Lab 09/19/17 1420 09/20/17 0406 09/21/17 0502 09/22/17 0250 09/23/17 0542 09/24/17 0218 09/25/17 0304  NA 141 141 141 138 141 141 140  K 4.1 4.5 4.5 4.2 4.0 4.3 3.8  CL 100* 101 97* 96* 96* 96* 94*  CO2 30 30 32 32 33* 34* 35*  GLUCOSE 178* 153* 121* 200* 98 145* 130*  BUN 21* 21* 23* 25* 26* 25* 26*  CREATININE 1.24* 1.14* 1.03* 1.32* 1.30* 1.31* 1.36*  CALCIUM 8.5* 8.5* 8.5* 8.6* 8.6* 9.0 8.7*  MG 2.0 2.0  --   --   --   --   --    GFR: Estimated Creatinine Clearance: 39.4 mL/min (A) (by C-G formula based on SCr of 1.36 mg/dL (H)). Liver Function Tests: Recent Labs  Lab 09/19/17 1420 09/21/17 0502 09/23/17 0542 09/25/17 0304  AST 35 27 23 24   ALT 26 26 22 21   ALKPHOS 114 152* 134* 131*  BILITOT 1.5* 1.3* 1.2 1.0  PROT 5.1* 5.0* 4.6* 4.8*  ALBUMIN 2.8* 2.5* 2.4* 2.5*   No results for input(s): LIPASE, AMYLASE in the last 168 hours. No results for input(s): AMMONIA in the last 168 hours. Coagulation Profile: Recent Labs  Lab 09/23/17 0542  INR 1.24   Cardiac Enzymes: No results for input(s): CKTOTAL, CKMB, CKMBINDEX, TROPONINI in the last 168 hours. BNP (last 3 results) No results for input(s): PROBNP in the last 8760  hours. HbA1C: No results for input(s): HGBA1C in the last 72 hours. CBG: No results for input(s): GLUCAP in the last 168 hours. Lipid Profile: No results for input(s): CHOL, HDL, LDLCALC, TRIG, CHOLHDL, LDLDIRECT in the last 72 hours. Thyroid Function Tests: No results for input(s): TSH, T4TOTAL, FREET4, T3FREE, THYROIDAB in the last 72 hours. Anemia Panel: No results for input(s): VITAMINB12, FOLATE, FERRITIN, TIBC, IRON, RETICCTPCT in the last 72 hours. Urine analysis:    Component Value Date/Time   COLORURINE YELLOW 09/16/2017 1922   APPEARANCEUR CLEAR 09/16/2017 1922   LABSPEC 1.008 09/16/2017 1922   PHURINE 5.0 09/16/2017 [place] NEGATIVE 09/16/2017 [place] NEGATIVE 09/16/2017 1922  [place] NEGATIVE 09/16/2017 [place] NEGATIVE 09/16/2017 [place] NEGATIVE 09/16/2017 1922   UROBILINOGEN 1 01/13/2015 1145   NITRITE NEGATIVE 09/16/2017 1922   LEUKOCYTESUR TRACE (A) 09/16/2017 1922   Sepsis Labs: @LABRCNTIP (procalcitonin:4,lacticidven:4)  ) Recent Results (from the past 240 hour(s))  MRSA PCR Screening     Status: None   Collection Time: 09/16/17 11:37 PM  Result Value Ref Range Status   MRSA by PCR NEGATIVE NEGATIVE Final    Comment:        The GeneXpert MRSA Assay (FDA approved for NASAL specimens only), is one component of a comprehensive MRSA colonization surveillance program. It is not intended to diagnose MRSA infection nor to guide or monitor treatment for MRSA infections. Performed at [facility], [place] [address], [place], [place] [address]   Body fluid culture     Status: None   Collection Time: 09/17/17  1:00 PM  Result Value Ref Range Status   Specimen Description PLEURAL  Final   Special Requests LEFT  Final   Gram Stain   Final    RARE WBC PRESENT, PREDOMINANTLY PMN NO ORGANISMS SEEN    Culture   Final    No growth aerobically or anaerobically. Performed at [facility], [place] [address]  [place], [place] [address]    Report Status 09/21/2017 FINAL  Final  Culture, blood (routine x 2) Call MD if unable to obtain prior to antibiotics being given     Status: None   Collection Time: 09/18/17 12:20 PM  Result Value Ref Range Status   Specimen Description BLOOD LEFT HAND  Final   Special Requests   Final    BOTTLES DRAWN AEROBIC AND ANAEROBIC Blood Culture adequate volume   Culture   Final    NO GROWTH 5 DAYS Performed at [facility], [place] [address] [place], [place] [address]    Report Status 09/23/2017 FINAL  Final  Culture, blood (routine x 2) Call MD if unable to obtain prior to antibiotics being given     Status: None   Collection Time: 09/18/17 12:30 PM  Result Value Ref Range Status   Specimen Description BLOOD RIGHT HAND  Final   Special Requests   Final    BOTTLES DRAWN AEROBIC AND ANAEROBIC Blood Culture adequate volume   Culture   Final    NO GROWTH 5 DAYS Performed at [facility], [place] [address] [place], [place] [address]    Report Status 09/23/2017 FINAL  Final  Respiratory Panel by PCR     Status: Abnormal   Collection Time: 09/18/17  2:08 PM  Result Value Ref Range Status   Adenovirus NOT DETECTED NOT DETECTED Final   Coronavirus 229E NOT DETECTED NOT DETECTED Final   Coronavirus HKU1 NOT DETECTED NOT DETECTED Final   Coronavirus NL63 NOT DETECTED NOT DETECTED Final   Coronavirus OC43 NOT DETECTED NOT DETECTED Final   Metapneumovirus NOT DETECTED NOT DETECTED Final   Rhinovirus / Enterovirus NOT DETECTED NOT DETECTED Final   Influenza A NOT DETECTED NOT DETECTED Final   Influenza B NOT DETECTED NOT DETECTED Final   Parainfluenza Virus 1 NOT DETECTED NOT DETECTED Final   Parainfluenza Virus 2 NOT DETECTED NOT DETECTED Final   Parainfluenza Virus 3 NOT DETECTED NOT DETECTED Final   Parainfluenza Virus 4 NOT DETECTED NOT DETECTED Final   Respiratory Syncytial Virus DETECTED (A) NOT DETECTED Final    Comment: CRITICAL RESULT CALLED TO, READ  BACK  BY AND VERIFIED WITH: HS Lee RN 17:25 09/18/17 ([email or web address])    Bordetella pertussis NOT DETECTED NOT DETECTED Final   Chlamydophila pneumoniae NOT DETECTED NOT DETECTED Final   Mycoplasma pneumoniae NOT DETECTED NOT DETECTED Final    Comment: Performed at [facility], [place] [address] [place], [place] [address]  Culture, body fluid-bottle     Status: None (Preliminary result)   Collection Time: 09/22/17  2:01 PM  Result Value Ref Range Status   Specimen Description PERITONEAL  Final   Special Requests BOTTLES DRAWN AEROBIC AND ANAEROBIC  Final   Culture   Final    NO GROWTH 3 DAYS Performed at [facility], [place] [address] [place], [place] [address]    Report Status PENDING  Incomplete  Gram stain     Status: None   Collection Time: 09/22/17  2:01 PM  Result Value Ref Range Status   Specimen Description PERITONEAL  Final   Special Requests NONE  Final   Gram Stain   Final    NO WBC SEEN NO ORGANISMS SEEN Performed at [facility], [address] [place], [place] [address]    Report Status 09/22/2017 FINAL  Final         Radiology Studies: Dg Cervical Spine 2 Or 3 Views  Result Date: 09/25/2017 CLINICAL DATA:  72 year old female with new onset midline cervical spine pain radiating to the right side. Soft tissue swelling and erythema. EXAM: CERVICAL SPINE - 2-3 VIEW COMPARISON:  Cervical spine CT 12/07/2016. FINDINGS: C4-C5 and C5-C6 anterior and posterior fusion sequelae is stable. Hardware appears stable and intact. Stable and relatively preserved cervical lordosis. Normal prevertebral soft tissue contour. Normal AP alignment. No acute osseous abnormality identified. No subcutaneous gas. No discrete neck soft tissue abnormality is evident. Negative visible upper chest. Partially visible shoulder arthroplasty on the lateral view. IMPRESSION: Stable since 2018 and negative postoperative radiographic appearance of the cervical spine. Electronically Signed   By: Genevie Ann M.D.   On: 09/25/2017 11:26        Scheduled Meds: . allopurinol  300 mg Oral Daily  . bisoprolol  10 mg Oral Q supper  . budesonide  0.25 mg Nebulization BID  . chlorhexidine  15 mL Mouth Rinse BID  . citalopram  20 mg Oral Q supper  . gabapentin  400 mg Oral TID  . ipratropium-albuterol  3 mL Nebulization TID  . lactulose  10 g Oral BID  . mouth rinse  15 mL Mouth Rinse q12n4p  . metolazone  5 mg Oral BID  . polyethylene glycol  17 g Oral Daily  . potassium chloride  20 mEq Oral 2 times per day  . rifampin  300 mg Oral BID WC  . sodium chloride flush  10-40 mL Intracatheter Q12H  . spironolactone  150 mg Oral Daily   Continuous Infusions: . furosemide 100 mg (09/26/17 0621)     LOS: 10 days    Time spent: 40 minutes    Quetzaly Ebner, Geraldo Docker, MD [facility] Pager [phone]   If 7PM-7AM, please contact night-coverage [email or web address] Password [facility] 09/26/2017, 9:40 AM

## 2017-09-26 NOTE — Progress Notes (Signed)
Daily Progress Note   Patient Name: [name]       Date: 09/26/2017 DOB: November 02, 1945  Age: 72 y.o. MRN#: [id number] Attending Physician: Allie Bossier, MD Primary Care Physician: Unk Pinto, MD Admit Date: 09/16/2017  Reason for Consultation/Follow-up: Establishing goals of care  Subjective: Patient awake, alert, oriented. Short of breath at rest. C/o of lower abdominal pain.   GOC:  Husband at bedside. Again discussed hospital diagnoses and interventions. Discussed guarded prognosis secondary to multiple factors contributing to acute on chronic respiratory failure (COPD, pneumonia, recurrent pleural effusions, ascites, and CHF). Also with concern for continued pleural effusions and ascites with chronic progressive cirrhosis.   Patient continuously tells husband and I that she wants to go "home." She does not want to go to SNF. She wants to go home and spend time with family. She remains hopeful that she will survive long enough to meet her new grandchild due at beginning of May. She also understands she is very sick and time is limited.   I explained the best option to get her home and prevent recurrent hospitalization is to utilize hospice services. Educated on goal of comfort, quality and dignity at EOL. Explained symptom management focus in order to facilitate keeping her home and prevent re-hospitalization.   Confirmed our conversation last week regarding MOST form, DNR/DNI.   Patient and husband agree with plan to try and get her home with hospice services. They request home BiPAP HS prn for comfort. Husband is hopeful that diuretics and restricting fluids will help prevent recurrent fluid buildup.  Therapeutic listening and emotional/spiritual support provided.    Length  of Stay: 10  Current Medications: Scheduled Meds:  . allopurinol  300 mg Oral Daily  . bisoprolol  10 mg Oral Q supper  . budesonide  0.25 mg Nebulization BID  . chlorhexidine  15 mL Mouth Rinse BID  . citalopram  20 mg Oral Q supper  . gabapentin  400 mg Oral TID  . ipratropium-albuterol  3 mL Nebulization TID  . lactulose  10 g Oral BID  . mouth rinse  15 mL Mouth Rinse q12n4p  . metolazone  5 mg Oral BID  . polyethylene glycol  17 g Oral Daily  . potassium chloride  20 mEq Oral 2 times per day  . rifampin  300 mg Oral BID WC  . sodium chloride flush  10-40 mL Intracatheter Q12H  . spironolactone  150 mg Oral Daily    Continuous Infusions: . furosemide Stopped (09/26/17 0730)    PRN Meds: acetaminophen, albuterol, bisacodyl, cyclobenzaprine, guaiFENesin-dextromethorphan, magnesium citrate, menthol-cetylpyridinium, ondansetron **OR** ondansetron (ZOFRAN) IV, oxyCODONE, phenol, sodium chloride flush, traMADol  Physical Exam  Constitutional: She is oriented to person, place, and time. She is cooperative.  HENT:  Head: Normocephalic and atraumatic.  Cardiovascular: Regular rhythm.  Pulmonary/Chest: No accessory muscle usage. No tachypnea. No respiratory distress. She has decreased breath sounds.  Dyspnea at rest  Abdominal: She exhibits distension. There is tenderness.  Neurological: She is alert and oriented to person, place, and time.  Skin: Skin is warm and dry.  Psychiatric: She has a normal mood and affect. Her speech is normal and behavior is normal. Cognition and memory are normal.  Nursing note and vitals reviewed.          Vital Signs: BP (!) 114/46   Pulse 90   Temp 98.3 F (36.8 C) (Oral)   Resp 13   Ht 5' 2"  (1.575 m)   Wt 87.1 kg (192 lb 0.3 oz)   SpO2 94%   BMI 35.12 kg/m  SpO2: SpO2: 94 % O2 Device: O2 Device: Nasal Cannula O2 Flow Rate: O2 Flow Rate (L/min): 3 L/min  Intake/output summary:   Intake/Output Summary (Last 24 hours) at 09/26/2017  1158 Last data filed at 09/26/2017 0900 Gross per 24 hour  Intake 1620 ml  Output 2350 ml  Net -730 ml   LBM: Last BM Date: 09/26/17 Baseline Weight: Weight: 86.2 kg (190 lb) Most recent weight: Weight: 87.1 kg (192 lb 0.3 oz)       Palliative Assessment/Data: PPS 50%   Flowsheet Rows     Most Recent Value  Intake Tab  Referral Department  Hospitalist  Unit at Time of Referral  Intermediate Care Unit  Palliative Care Primary Diagnosis  -- [COPD, recurrent pleural effusion, cirrhosis, hepatocellular carcinoma]  Palliative Care Type  New Palliative care  Reason for referral  Clarify Goals of Care  Date first seen by Palliative Care  09/19/17  Clinical Assessment  Palliative Performance Scale Score  50%  Psychosocial & Spiritual Assessment  Palliative Care Outcomes  Patient/Family meeting held?  Yes  Who was at the meeting?  patient and husband  Palliative Care Outcomes  Clarified goals of care, Provided end of life care assistance, Provided psychosocial or spiritual support, ACP counseling assistance, Counseled regarding hospice      Patient Active Problem List   Diagnosis Date Noted  . Palliative care by specialist   . Acute on chronic respiratory failure ([place]) 09/16/2017  . Acute diastolic CHF (congestive heart failure) ([place]) 09/11/2017  . Pleural effusion 09/11/2017  . Ascites 09/07/2017  . Hepatocellular carcinoma ([place]) 08/30/2017  . Anasarca 12/14/2016  . COPD exacerbation ([place]) 11/30/2016  . Obesity (BMI 30-39.9) 10/12/2016  . Hyperammonemia ([place]) 10/12/2016  . Atrial tachycardia, multifocal ([place])   . PNA (pneumonia) 01/24/2016  . Chronic respiratory failure ([place]) 01/19/2016  . Hepatic encephalopathy ([place]) 09/21/2015  . Hypokalemia 09/21/2015  . Acute on chronic respiratory failure with hypoxemia ([place]) 08/07/2015  . Thrombocytopenia ([place]) 08/07/2015  . Venous insufficiency 07/26/2015  . Goals of care, counseling/discussion 12/14/2013  . Essential  hypertension   . Hyperlipidemia   . GERD   . Vitamin D deficiency   . IBS   . Fibromyalgia   . Esophageal varices ([facility])  07/08/2012  . COLONIC POLYPS 11/11/2008  . Anemia 05/28/2008  . Cirrhosis ([place]) 05/28/2008  . Esophagitis 05/27/2008  . Diverticulosis of large intestine 05/27/2008    Palliative Care Assessment & Plan   Patient Profile: 72 y.o. female  with past medical history of COPD on home oxygen, recurrent pleural effusions, non-alcoholic related liver cirrhosis, hepatocellular carcinoma diagnosed November 2018, hepatic encephalopathy, esophageal varices, fibromyalgia, HLD, HTN, IBS, splenomegaly, vitamin D deficiency, and depression admitted on 09/16/2017 with shortness of breath and abdominal distention. Recent hospitalization 2/2-2/8 for respiratory failure, recurrent pleural effusion s/p thoracentesis, and anasarca. 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 Thoracentesis on 2/12 with 900cc removed. Acute on chronic respiratory failure with underlying COPD on home oxygen. Also possible OSA using prn CPAP/BiPAP HS. CXR on 2/13 consistent with RLL pneumonia. Started on IV antibiotics. Also receiving nebs and steroids. GI consult-consider TIPS versus pleurx. Also with chronic diastolic CHF. Palliative medicine consultation for goals of care/short versus   Assessment: Chronic respiratory failure COPD exacerbation Pneumonia Recurrent right pleural effusion CHF NASH cirrhosis Hepatocellular carcinoma  Recommendations/Plan:  DNR/DNI  MOST form completed and in chart.   Patient/husband seem to have a good understanding of guarded prognosis.   Patient wishes to go home and agreeable with home hospice services, understanding focus on comfort and symptom management.   Patient has an oncology appointment on 2/26 that she wishes to attend, also understanding she is not a surgical candidate and functionally not able to tolerate aggressive  interventions such as chemo (if recommended by oncology).   Recommend BiPAP HS prn comfort when discharged.   Discussed with RN CM and Dr. Sherral Hammers. Pending hospice referral if they will accept trilogy or bipap.   PMT will follow.   Code Status: DNR   Code Status Orders  (From admission, onward)        Start     Ordered   09/16/17 1647  Do not attempt resuscitation (DNR)  Continuous    Question Answer Comment  In the event of cardiac or respiratory ARREST Do not call a "code blue"   In the event of cardiac or respiratory ARREST Do not perform Intubation, CPR, defibrillation or ACLS   In the event of cardiac or respiratory ARREST Use medication by any route, position, wound care, and other measures to relive pain and suffering. May use oxygen, suction and manual treatment of airway obstruction as needed for comfort.      09/16/17 1649    Code Status History    Date Active Date Inactive Code Status Order ID Comments User Context   09/07/2017 20:23 09/13/2017 23:21 Full Code [id number]  Bonnell Public, MD Inpatient   11/30/2016 20:29 12/02/2016 16:56 Partial Code [id number]  Edwin Dada, MD Inpatient   11/08/2016 01:02 11/11/2016 15:07 Partial Code [id number]  Ivor Costa, MD ED   10/12/2016 18:53 10/14/2016 14:29 Full Code [id number]  Rama, Venetia Maxon, MD Inpatient   07/12/2016 00:38 07/13/2016 17:31 Full Code [id number]  Edwin Dada, MD Inpatient   01/20/2016 00:22 01/24/2016 16:13 Full Code [id number]  Albertine Patricia, MD Inpatient   10/29/2015 23:03 10/31/2015 14:10 Full Code [id number]  Reubin Milan, MD Inpatient   09/21/2015 21:13 09/24/2015 15:53 Full Code [id number]  Edwin Dada, MD Inpatient   08/07/2015 18:37 08/09/2015 18:31 Full Code [id number]  Caren Griffins, MD ED    Advance Directive Documentation     Most Recent Value  Type of Advance Directive  Out  of facility DNR (pink MOST or yellow form)  Pre-existing out of facility DNR order (yellow form or  pink MOST form)  Yellow form placed in chart (order not valid for inpatient use)  "MOST" Form in Place?  No data       Prognosis:   Unable to determine: guarded with acute on chronic respiratory failure secondary to COPD, pneumonia, CHF, recurrent right pleural effusion. Also with liver cirrhosis, hepatocellular carcinoma, and recurrent ascites.    Discharge Planning:  Home with Hospice  Care plan was discussed with patient, husband, RN CM, Dr. Sherral Hammers, RN, hospice liaisons   Thank you for allowing the Palliative Medicine Team to assist in the care of this patient.   Time In/Out: 1040-1200 with patient/husband, Also, multiple conversations throughout the day with multidisciplinary team.   Total Time 169mn Prolonged Time Billed YES      Greater than 50%  of this time was spent counseling and coordinating care related to the above assessment and plan.  MIhor Dow FNP-C Palliative Medicine Team  Phone: [phone]: [phone] Please contact Palliative Medicine Team phone at [phone] questions and concerns.

## 2017-09-27 ENCOUNTER — Inpatient Hospital Stay (HOSPITAL_COMMUNITY): Payer: PPO

## 2017-09-27 DIAGNOSIS — G4733 Obstructive sleep apnea (adult) (pediatric): Secondary | ICD-10-CM

## 2017-09-27 DIAGNOSIS — J181 Lobar pneumonia, unspecified organism: Secondary | ICD-10-CM

## 2017-09-27 LAB — AMMONIA: AMMONIA: 28 umol/L (ref 9–35)

## 2017-09-27 LAB — CULTURE, BODY FLUID-BOTTLE

## 2017-09-27 LAB — CBC
HCT: 28.4 % — ABNORMAL LOW (ref 36.0–46.0)
Hemoglobin: 8.6 g/dL — ABNORMAL LOW (ref 12.0–15.0)
MCH: 31.3 pg (ref 26.0–34.0)
MCHC: 30.3 g/dL (ref 30.0–36.0)
MCV: 103.3 fL — AB (ref 78.0–100.0)
PLATELETS: 69 10*3/uL — AB (ref 150–400)
RBC: 2.75 MIL/uL — ABNORMAL LOW (ref 3.87–5.11)
RDW: 16.1 % — AB (ref 11.5–15.5)
WBC: 4.5 10*3/uL (ref 4.0–10.5)

## 2017-09-27 LAB — CULTURE, BODY FLUID W GRAM STAIN -BOTTLE: Culture: NO GROWTH

## 2017-09-27 LAB — BASIC METABOLIC PANEL
ANION GAP: 13 (ref 5–15)
BUN: 25 mg/dL — ABNORMAL HIGH (ref 6–20)
CALCIUM: 8.9 mg/dL (ref 8.9–10.3)
CO2: 38 mmol/L — AB (ref 22–32)
CREATININE: 1.38 mg/dL — AB (ref 0.44–1.00)
Chloride: 86 mmol/L — ABNORMAL LOW (ref 101–111)
GFR, EST AFRICAN AMERICAN: 44 mL/min — AB (ref >60)
GFR, EST NON AFRICAN AMERICAN: 38 mL/min — AB (ref >60)
GLUCOSE: 118 mg/dL — AB (ref 65–99)
Potassium: 3.1 mmol/L — ABNORMAL LOW (ref 3.5–5.1)
Sodium: 137 mmol/L (ref 135–145)

## 2017-09-27 LAB — MAGNESIUM: Magnesium: 1.7 mg/dL (ref 1.7–2.4)

## 2017-09-27 NOTE — Progress Notes (Signed)
Received call from Dr. Sherral Hammers stating patient/family now declining hospice. Discussed with Cheri and updated Dr. Sherral Hammers. Patient/husband eager to f/u with outpatient oncology and hopeful for further treatment options (even though we discussed this yesterday--that she is not a candidate for liver transplant/oncology options due to multiple co-morbidities and acute hospitalization). Palliative services will continue to follow outpatient in hopes of transition to hospice services when patient/husband ready.   NO CHARGE  Ihor Dow, FNP-C Palliative Medicine Team  Phone: [phone] Fax: [phone]

## 2017-09-27 NOTE — Consult Note (Signed)
[place]:  Met with pt and her husband at bedside. Discussed hospice care and philosophy of hospice. During conversation about goals of care. The husband brought up that they have multiple appointments coming up to have scans of liver and to follow up with the oncologist at [facility] the first week of March. They do not feel that they are ready to stop any treatment that the oncologist may have to offer them at this time. Due to there goals not being in line with the hospice philosphy and wanting/hoping for treatment for the liver they would not be a candidate for hospice care at home. I have let Alisha know and left a card with the family to follow up with [place] if something changes or if they decided to focus on the pure comfort approach. Please call with concerns or questions Webb Silversmith RN [phone]

## 2017-09-27 NOTE — Care Management Note (Addendum)
Case Management Note  Patient Details  Name: [name] MRN: [id number] Date of Birth: [birth year]  Subjective/Objective:  COPD exac, acute resp failure with hypoxemia, Hepatic Encephalopathy, hepatocellular carcinoma, PNA                 Action/Plan: Please see previous NCM notes. Received call from [facility], [facility] # [phone] and they will accept referral with Bipap. Will be able to do a start of care 09/28/2017. Contacted attending to inquire about pleural effusion and pleurx drain. Will continue to follow for dc needs.   [facility] spoke to pt and she is declining Hospice at this time. Will arrange [facility] RN, and Trilogy by [facility]. '  [name] will deliver Trilogy to room today and instructed pt on how to use at home.    Expected Discharge Date:                 Expected Discharge Plan: Home Health In-House Referral:  NA  Discharge planning Services  CM Consult  Post Acute Care Choice:  Hospice Choice offered to:  Patient  DME Arranged:  Bipap DME Agency:  [place] Arranged:  RN [facility] Agency:  [place]  Status of Service:  Completed, signed off  If discussed at [place] of Stay Meetings, dates discussed:    Additional Comments:  Erenest Rasher, RN 09/27/2017, 9:39 AM

## 2017-09-27 NOTE — Progress Notes (Signed)
Daily Progress Note   Patient Name: [name]       Date: 09/27/2017 DOB: 1946-02-26  Age: 72 y.o. MRN#: [id number] Attending Physician: Allie Bossier, MD Primary Care Physician: Unk Pinto, MD Admit Date: 09/16/2017  Reason for Consultation/Follow-up: Establishing goals of care  Subjective: Patient awake, alert, oriented. Sitting up in chair this morning. Denies dyspnea or pain. States relief from pain and dyspnea from prn oxycodone. She wants to go home.   GOC:  Husband at bedside. Again discussed plan for home with hospice services with focus on comfort, quality, and symptom management. Explained that she will likely have continued fluid buildup in lung and abdomen, but no current indication for pleurx catheter or another paracentesis. Husband is hopeful that fluid can be managed by continuing diuretic and restricting fluids. Answered questions and concerns. Emotional support provided.   Length of Stay: 11  Current Medications: Scheduled Meds:  . allopurinol  300 mg Oral Daily  . bisoprolol  10 mg Oral Q supper  . budesonide  0.25 mg Nebulization BID  . chlorhexidine  15 mL Mouth Rinse BID  . citalopram  20 mg Oral Q supper  . gabapentin  400 mg Oral TID  . ipratropium-albuterol  3 mL Nebulization TID  . lactulose  10 g Oral Daily  . mouth rinse  15 mL Mouth Rinse q12n4p  . metolazone  5 mg Oral BID  . polyethylene glycol  17 g Oral Daily  . potassium chloride  20 mEq Oral 2 times per day  . rifampin  300 mg Oral BID WC  . sodium chloride flush  10-40 mL Intracatheter Q12H  . spironolactone  150 mg Oral Daily    Continuous Infusions:   PRN Meds: acetaminophen, albuterol, bisacodyl, cyclobenzaprine, guaiFENesin-dextromethorphan, magnesium citrate,  menthol-cetylpyridinium, ondansetron **OR** ondansetron (ZOFRAN) IV, oxyCODONE, phenol, sodium chloride flush, traMADol  Physical Exam  Constitutional: She is oriented to person, place, and time. She is cooperative.  HENT:  Head: Normocephalic and atraumatic.  Cardiovascular: Regular rhythm.  Pulmonary/Chest: No accessory muscle usage. No tachypnea. No respiratory distress.  Abdominal: She exhibits distension. There is no tenderness.  Neurological: She is alert and oriented to person, place, and time.  Skin: Skin is warm and dry. There is pallor.  Psychiatric: She has a normal mood and affect. Her speech  is normal and behavior is normal. Cognition and memory are normal.  Nursing note and vitals reviewed.          Vital Signs: BP (!) 104/48 (BP Location: Right Wrist)   Pulse 73   Temp 99 F (37.2 C) (Oral)   Resp 16   Ht 5' 2"  (1.575 m)   Wt 90.3 kg (199 lb 1.2 oz)   SpO2 98%   BMI 36.41 kg/m  SpO2: SpO2: 98 % O2 Device: O2 Device: Nasal Cannula O2 Flow Rate: O2 Flow Rate (L/min): 5 L/min  Intake/output summary:   Intake/Output Summary (Last 24 hours) at 09/27/2017 1113 Last data filed at 09/27/2017 1055 Gross per 24 hour  Intake 310 ml  Output 2500 ml  Net -2190 ml   LBM: Last BM Date: 09/26/17 Baseline Weight: Weight: 86.2 kg (190 lb) Most recent weight: Weight: 90.3 kg (199 lb 1.2 oz)       Palliative Assessment/Data: PPS 50%   Flowsheet Rows     Most Recent Value  Intake Tab  Referral Department  Hospitalist  Unit at Time of Referral  Intermediate Care Unit  Palliative Care Primary Diagnosis  -- [COPD, recurrent pleural effusion, cirrhosis, hepatocellular carcinoma]  Palliative Care Type  New Palliative care  Reason for referral  Clarify Goals of Care  Date first seen by Palliative Care  09/19/17  Clinical Assessment  Palliative Performance Scale Score  50%  Psychosocial & Spiritual Assessment  Palliative Care Outcomes  Patient/Family meeting held?  Yes    Who was at the meeting?  patient and husband  Palliative Care Outcomes  Clarified goals of care, Provided end of life care assistance, Provided psychosocial or spiritual support, ACP counseling assistance, Counseled regarding hospice      Patient Active Problem List   Diagnosis Date Noted  . Hypoxia   . Palliative care by specialist   . Acute on chronic respiratory failure ([place]) 09/16/2017  . Acute diastolic CHF (congestive heart failure) ([place]) 09/11/2017  . Pleural effusion 09/11/2017  . Ascites 09/07/2017  . Hepatocellular carcinoma ([place]) 08/30/2017  . Anasarca 12/14/2016  . COPD exacerbation ([place]) 11/30/2016  . Obesity (BMI 30-39.9) 10/12/2016  . Hyperammonemia ([place]) 10/12/2016  . Atrial tachycardia, multifocal ([place])   . PNA (pneumonia) 01/24/2016  . Chronic respiratory failure ([place]) 01/19/2016  . Hepatic encephalopathy ([place]) 09/21/2015  . Hypokalemia 09/21/2015  . Acute on chronic respiratory failure with hypoxemia ([place]) 08/07/2015  . Thrombocytopenia ([place]) 08/07/2015  . Venous insufficiency 07/26/2015  . Goals of care, counseling/discussion 12/14/2013  . Essential hypertension   . Hyperlipidemia   . GERD   . Vitamin D deficiency   . IBS   . Fibromyalgia   . Esophageal varices ([place]) 07/08/2012  . COLONIC POLYPS 11/11/2008  . Anemia 05/28/2008  . Cirrhosis ([place]) 05/28/2008  . Esophagitis 05/27/2008  . Diverticulosis of large intestine 05/27/2008    Palliative Care Assessment & Plan   Patient Profile: 72 y.o. female  with past medical history of COPD on home oxygen, recurrent pleural effusions, non-alcoholic related liver cirrhosis, hepatocellular carcinoma diagnosed November 2018, hepatic encephalopathy, esophageal varices, fibromyalgia, HLD, HTN, IBS, splenomegaly, vitamin D deficiency, and depression admitted on 09/16/2017 with shortness of breath and abdominal distention. Recent hospitalization 2/2-2/8 for respiratory failure, recurrent pleural effusion s/p  thoracentesis, and anasarca. 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 Thoracentesis on 2/12 with 900cc removed. Acute on chronic respiratory failure with underlying COPD on home oxygen. Also possible OSA using prn  CPAP/BiPAP HS. CXR on 2/13 consistent with RLL pneumonia. Started on IV antibiotics. Also receiving nebs and steroids. GI consult-consider TIPS versus pleurx. Also with chronic diastolic CHF. Palliative medicine consultation for goals of care/short versus   Assessment: Chronic respiratory failure COPD exacerbation Pneumonia Recurrent right pleural effusion CHF NASH cirrhosis Hepatocellular carcinoma  Recommendations/Plan:  DNR/DNI  MOST form completed and in chart.   Patient/husband seem to have a good understanding of guarded prognosis.   Patient wishes to go home and agreeable with home hospice services, understanding focus on comfort and symptom management.   Patient has an oncology appointment on 2/26 that she wishes to attend, also understanding she is not a surgical candidate and functionally not able to tolerate aggressive interventions such as chemo (if recommended by oncology).   Recommend BiPAP HS prn comfort when discharged.   Hospice liaison requesting pleurx catheter to be placed. Discussed and reviewed CXR with Dr. Sherral Hammers. No indication for pleurx at this time.  Hospice liaison to meet with patient and husband this morning.   Code Status: DNR   Code Status Orders  (From admission, onward)        Start     Ordered   09/16/17 1647  Do not attempt resuscitation (DNR)  Continuous    Question Answer Comment  In the event of cardiac or respiratory ARREST Do not call a "code blue"   In the event of cardiac or respiratory ARREST Do not perform Intubation, CPR, defibrillation or ACLS   In the event of cardiac or respiratory ARREST Use medication by any route, position, wound care, and other measures to relive pain and  suffering. May use oxygen, suction and manual treatment of airway obstruction as needed for comfort.      09/16/17 1649    Code Status History    Date Active Date Inactive Code Status Order ID Comments User Context   09/07/2017 20:23 09/13/2017 23:21 Full Code [id number]  Bonnell Public, MD Inpatient   11/30/2016 20:29 12/02/2016 16:56 Partial Code [id number]  Edwin Dada, MD Inpatient   11/08/2016 01:02 11/11/2016 15:07 Partial Code [id number]  Ivor Costa, MD ED   10/12/2016 18:53 10/14/2016 14:29 Full Code [id number]  Rama, Venetia Maxon, MD Inpatient   07/12/2016 00:38 07/13/2016 17:31 Full Code [id number]  Edwin Dada, MD Inpatient   01/20/2016 00:22 01/24/2016 16:13 Full Code [id number]  Albertine Patricia, MD Inpatient   10/29/2015 23:03 10/31/2015 14:10 Full Code [id number]  Reubin Milan, MD Inpatient   09/21/2015 21:13 09/24/2015 15:53 Full Code [id number]  Edwin Dada, MD Inpatient   08/07/2015 18:37 08/09/2015 18:31 Full Code [id number]  Caren Griffins, MD ED    Advance Directive Documentation     Most Recent Value  Type of Advance Directive  Out of facility DNR (pink MOST or yellow form)  Pre-existing out of facility DNR order (yellow form or pink MOST form)  Yellow form placed in chart (order not valid for inpatient use)  "MOST" Form in Place?  No data       Prognosis:   < 6 months: acute on chronic respiratory failure secondary to COPD, pneumonia, CHF, recurrent right pleural effusion. Also with liver cirrhosis, hepatocellular carcinoma, and recurrent ascites.    Discharge Planning:  Home with Hospice  Care plan was discussed with patient, husband, Dr. Sherral Hammers, RN, hospice liaison  Thank you for allowing the Palliative Medicine Team to assist in the care of this patient.   Time in/out 1040-1110  Total Time 41mn Prolonged Time Billed no      Greater than 50%  of this time was spent counseling and coordinating care related to the above assessment and  plan.  MIhor Dow FNP-C Palliative Medicine Team  Phone: [phone]: [phone] Please contact Palliative Medicine Team phone at [phone] questions and concerns.

## 2017-09-27 NOTE — Progress Notes (Signed)
PROGRESS NOTE    [name]  JQZ:[id number] DOB: 1945/10/13 DOA: 09/16/2017 PCP: Unk Pinto, MD   Brief Narrative:  72 y.o.WF PMHx Depression ,Diabetes mellitus without complication (diet controlled) COPD on 2-3 L O2 at home, non-alcohol related liver cirrhosis, Esophageal varices,Hepatic encephalopathy , and Hepatocellular Carcinoma diagnosed Nov 2018 s/p CT guided ablation with hospitalization 2/2 >>> 09/13/17 due to anasarca and Acute on Chronic Respiratory failure s/p thoracentesis Asthma, Fibromyalgia, HLD, HTN, IBS,Splenomegaly, Vitamin D deficiency who returned to the ED 2/11 with recurrent respiratory failure.  CXR showed re-accumulation of a right-sided pleural effusion..       Subjective: 2/22  A/O x 4, positive SOB (increased O2 demand today), positive productive cough. Negative abdominal pain. Negative CP.     Assessment & Plan:   Principal Problem:   Acute on chronic respiratory failure with hypoxemia (HCC) Active Problems:   Anemia   Cirrhosis (HCC)   Essential hypertension   Goals of care, counseling/discussion   Chronic respiratory failure (HCC)   Obesity (BMI 30-39.9)   COPD exacerbation (HCC)   Anasarca   Hepatocellular carcinoma (HCC)   Ascites   Pleural effusion   Palliative care by specialist   Hypoxia  Acute on Chronic Respiratory Failure with Hypoxia -discharged on 2/8  On 4L O2 via [place]. Currently on 5 L O2 via [place] at rest -Multifactorial COPD exacerbation, positive RSV pneumonia, HCAP, recurrent RIGHT pleural effusion, ascites.  -Titrate O2 to maintain SPO2 89-93%  -CPAP/BiPAP  QHS PER RESPIRATORY TO MAINTAIN SPO2 89 -93%  -See RSV pneumonia  COPD exacerbation -See respiratory failure See respiratory failure  OSA/OHS -CPAP per respiratory -Patient requires Trilogy prior to discharge. Patient at high risk of early readmission without Trilogy machine. Unlikely remain out of the hospital long enough for sleep study without machine. -Titrate  O2 to maintain SPO2 89-93% -Patient continues to exhibit signs of hypercapnia associated with chronic respiratory failure secondary to severe COPD. Patient requires the use of NIV both QHS and daytime to help with exacerbation periods. The use of the NIV Will treat patient's high PCO2 levels and can reduce risk of exacerbations and future hospitalizations when used at night and during the day. Patient will need these advanced settings in conjunction with her current medication regimen: BiPAP is not an option due to his functional limitations and the severity of the patient's condition. Failure to have NIV available for use over 24 hour period could lead to death.  RLL pneumonia (HCAP)//positive RSV pneumonia -2/13 PCXR consistent with RLL pneumonia. Although patient negative leukocytosis patient is immunocompromised with HCC/liver failure may not mount immune response. Given that second hospitalization within 7 days. Start pneumonia protocol. 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 -S/P RIGHT Thoracentesis: 900 ml aspirated.Pathology negative for malignant cells./culture and stain pending -GI DOES NOT recommend  TIPS AT THIS TIME. ON 2/17 SIGNED OFF.   -Pathology: No malignant cells  Nonalcoholic Liver Cirrhosis /[place]/Hepatocellular carcinoma -Check ammonia level sporadically to ensure not becoming elevated.  -Lactulose 10 g daily  -Follow-up with PA Amy Esterwood on 3/1 @1430  . Dr. Norvel Richards PA at [facility] for thoracentesis/Paracentesis will most likely require q 2-3 week outpatient appointments. Recurrent Pleural effusion, ascites secondary to [facility], hepatocellular carcinoma   Ascites -2/17 S/P paracentesis 2.6 L  Liver lesions (2) suspicious for [facility].  -Oncologist Dr. Truitt Merle  per  EMR last saw patient 07/10/2017 -08/30/2017 S/P CT guided Percutaneous Radiofrequency (MICROWAVE) Ablation of segment 6 Hepatic lesion -Per oncology note not candidate for surgery for liver transplant secondary to comorbidities -Per oncology patient should follow-up in 6-12 months post IR procedure.    Esophageal Varices, Hepatic Encephalopathy( Child Pugh score 6 (class A) -f/u with Dr. Enis Gash  see nonalcoholic liver cirrhosis.  Right neck mass -Patient with new onset C-spine/right soft tissue swelling. X-ray shows negative discrete soft tissue abnormality.  Chronic diastolic CHF (base weight 84.5 kg at discharge on 2/8) -Strict in and out since admission -3.3 L -Daily weight Filed Weights   09/25/17 0408 09/26/17 0432 09/27/17 0606  Weight: 194 lb 0.1 oz (88 kg) 192 lb 0.3 oz (87.1 kg) 199 lb 1.2 oz (90.3 kg)  -Lasix 60 mg TID + Zaroxolyn 5 mg BID -2/22 PCXR pending  Macrocytic anemia -B-12/folate pending    Macrocytic anemia -Check C94 and folic acid    CKD stage III (baseline Cr 1.3) -Follow Cr closely with diuretics Recent Labs  Lab 09/21/17 0502 09/22/17 0250 09/23/17 0542 09/24/17 0218 09/25/17 0304  CREATININE 1.03* 1.32* 1.30* 1.31* 1.36*   -Stable   Obesity -Body mass index is 36.09 kg/m    GOALS of care -PALLIATIVE CARE consult: Hepatocellular carcinoma, recurrent pleural effusion, esophageal varices discuss short-term vs long-term goals of care vs hospice     DVT prophylaxis: SCD Code Status: dO NOT RESUSCITATE Family Communication: husband at bedside for discussion of plan care Disposition Plan: SNF vs home with hospice   Consultants:  IR   Procedures/Significant Events:  2/8 d/c  2/11 re-admit w/ rapidly re-accumulated pleural effusion  2/12 S/P RIGHT Thoracentesis 900 ml aspirated 2/17 paracentesis in Radiology - 2.6L      I have personally reviewed and interpreted all radiology studies and my findings are as  above.  VENTILATOR SETTINGS:    Cultures 2/11 MRSA by PCR negative 2/12 RIGHT pleural fluid negative malignant cells: Culture pending 2/13 respiratory virus panel pending 2/13 sputum pending 2/13 HIV negative 2/13 strep pneumo pending 2/13 respiratory virus panel positive RSV    Antimicrobials: Anti-infectives (From admission, onward)   Start     Stop   09/19/17 0100  vancomycin (VANCOCIN) IVPB 750 mg/150 ml premix         09/18/17 1230  ceFEPIme (MAXIPIME) 2 g in sodium chloride 0.9 % 100 mL IVPB     09/26/17 1229   09/18/17 1215  vancomycin (VANCOCIN) 1,750 mg in sodium chloride 0.9 % 500 mL IVPB         09/16/17 1700  rifampin (RIFADIN) capsule 300 mg             Devices    LINES / TUBES:      Continuous Infusions:    Objective: Vitals:   09/27/17 0606 09/27/17 0756 09/27/17 0815 09/27/17 0817  BP:  (!) 107/47    Pulse:  79    Resp:  14    Temp:  98.8 F (37.1 C)    TempSrc:  Oral    SpO2:  95% 95% 99%  Weight: 199 lb 1.2 oz (90.3 kg)     Height:        Intake/Output Summary (Last 24 hours) at 09/27/2017 0831 Last data filed at 09/27/2017 0600 Gross per 24 hour  Intake 690 ml  Output 2250 ml  Net -1560 ml   Filed Weights   09/25/17 0408 09/26/17 0432 09/27/17 0606  Weight: 194 lb 0.1 oz (  88 kg) 192 lb 0.3 oz (87.1 kg) 199 lb 1.2 oz (90.3 kg)    Physical Exam:  General: A/O 4, acute on chronic respiratory distress Neck:  Negative scars, masses, torticollis, lymphadenopathy, JVD Lungs: tachypnea positive diffuse expiratory wheeze, negative crackles Cardiovascular: Tachycardic, Regular rhythm without murmur gallop or rub normal S1 and S2 Abdomen: Obese, negative  abdominal pain, positive distention, positive soft, bowel sounds, no rebound, no ascites, no appreciable mass Extremities: No significant cyanosis, clubbing, or edema bilateral lower extremities Skin: Negative rashes, lesions, ulcers Psychiatric:  Negative depression, negative  anxiety, negative fatigue, negative mania  Central nervous system:  Cranial nerves II through XII intact, tongue/uvula midline, all extremities muscle strength 5/5, sensation intact throughout, negative dysarthria, negative expressive aphasia, negative receptive aphasia.     Data Reviewed: Care during the described time interval was provided by me .  I have reviewed this patient's available data, including medical history, events of note, physical examination, and all test results as part of my evaluation.   CBC: Recent Labs  Lab 09/21/17 0502 09/23/17 0542 09/23/17 1518 09/25/17 0304  WBC 5.5 5.1 7.4 5.5  HGB 9.3* 8.8* 8.9* 8.5*  HCT 32.0* 29.1* 29.5* 28.4*  MCV 107.7* 105.8* 104.6* 103.6*  PLT 83* 77* 88* 78*   Basic Metabolic Panel: Recent Labs  Lab 09/21/17 0502 09/22/17 0250 09/23/17 0542 09/24/17 0218 09/25/17 0304  NA 141 138 141 141 140  K 4.5 4.2 4.0 4.3 3.8  CL 97* 96* 96* 96* 94*  CO2 32 32 33* 34* 35*  GLUCOSE 121* 200* 98 145* 130*  BUN 23* 25* 26* 25* 26*  CREATININE 1.03* 1.32* 1.30* 1.31* 1.36*  CALCIUM 8.5* 8.6* 8.6* 9.0 8.7*   GFR: Estimated Creatinine Clearance: 40.2 mL/min (A) (by C-G formula based on SCr of 1.36 mg/dL (H)). Liver Function Tests: Recent Labs  Lab 09/21/17 0502 09/23/17 0542 09/25/17 0304  AST 27 23 24   ALT 26 22 21   ALKPHOS 152* 134* 131*  BILITOT 1.3* 1.2 1.0  PROT 5.0* 4.6* 4.8*  ALBUMIN 2.5* 2.4* 2.5*   No results for input(s): LIPASE, AMYLASE in the last 168 hours. Recent Labs  Lab 09/27/17 0317  AMMONIA 28   Coagulation Profile: Recent Labs  Lab 09/23/17 0542  INR 1.24   Cardiac Enzymes: No results for input(s): CKTOTAL, CKMB, CKMBINDEX, TROPONINI in the last 168 hours. BNP (last 3 results) No results for input(s): PROBNP in the last 8760 hours. HbA1C: No results for input(s): HGBA1C in the last 72 hours. CBG: No results for input(s): GLUCAP in the last 168 hours. Lipid Profile: No results for  input(s): CHOL, HDL, LDLCALC, TRIG, CHOLHDL, LDLDIRECT in the last 72 hours. Thyroid Function Tests: No results for input(s): TSH, T4TOTAL, FREET4, T3FREE, THYROIDAB in the last 72 hours. Anemia Panel: No results for input(s): VITAMINB12, FOLATE, FERRITIN, TIBC, IRON, RETICCTPCT in the last 72 hours. Urine analysis:    Component Value Date/Time   COLORURINE YELLOW 09/16/2017 1922   APPEARANCEUR CLEAR 09/16/2017 1922   LABSPEC 1.008 09/16/2017 [place] 5.0 09/16/2017 [place] NEGATIVE 09/16/2017 [place] NEGATIVE 09/16/2017 [place] NEGATIVE 09/16/2017 [place] NEGATIVE 09/16/2017 [place] NEGATIVE 09/16/2017 1922   UROBILINOGEN 1 01/13/2015 1145   NITRITE NEGATIVE 09/16/2017 1922   LEUKOCYTESUR TRACE (A) 09/16/2017 1922   Sepsis Labs: @LABRCNTIP (procalcitonin:4,lacticidven:4)  ) Recent Results (from the past 240 hour(s))  Body fluid culture     Status: None  Collection Time: 09/17/17  1:00 PM  Result Value Ref Range Status   Specimen Description PLEURAL  Final   Special Requests LEFT  Final   Gram Stain   Final    RARE WBC PRESENT, PREDOMINANTLY PMN NO ORGANISMS SEEN    Culture   Final    No growth aerobically or anaerobically. Performed at [facility], [place] [address] [place], [place] [address]    Report Status 09/21/2017 FINAL  Final  Culture, blood (routine x 2) Call MD if unable to obtain prior to antibiotics being given     Status: None   Collection Time: 09/18/17 12:20 PM  Result Value Ref Range Status   Specimen Description BLOOD LEFT HAND  Final   Special Requests   Final    BOTTLES DRAWN AEROBIC AND ANAEROBIC Blood Culture adequate volume   Culture   Final    NO GROWTH 5 DAYS Performed at [facility], [place] [address] [place], [place] [address]    Report Status 09/23/2017 FINAL  Final  Culture, blood (routine x 2) Call MD if unable to obtain prior to antibiotics being given     Status: None    Collection Time: 09/18/17 12:30 PM  Result Value Ref Range Status   Specimen Description BLOOD RIGHT HAND  Final   Special Requests   Final    BOTTLES DRAWN AEROBIC AND ANAEROBIC Blood Culture adequate volume   Culture   Final    NO GROWTH 5 DAYS Performed at [facility], [place] [address] [place], [place] [address]    Report Status 09/23/2017 FINAL  Final  Respiratory Panel by PCR     Status: Abnormal   Collection Time: 09/18/17  2:08 PM  Result Value Ref Range Status   Adenovirus NOT DETECTED NOT DETECTED Final   Coronavirus 229E NOT DETECTED NOT DETECTED Final   Coronavirus HKU1 NOT DETECTED NOT DETECTED Final   Coronavirus NL63 NOT DETECTED NOT DETECTED Final   Coronavirus OC43 NOT DETECTED NOT DETECTED Final   Metapneumovirus NOT DETECTED NOT DETECTED Final   Rhinovirus / Enterovirus NOT DETECTED NOT DETECTED Final   Influenza A NOT DETECTED NOT DETECTED Final   Influenza B NOT DETECTED NOT DETECTED Final   Parainfluenza Virus 1 NOT DETECTED NOT DETECTED Final   Parainfluenza Virus 2 NOT DETECTED NOT DETECTED Final   Parainfluenza Virus 3 NOT DETECTED NOT DETECTED Final   Parainfluenza Virus 4 NOT DETECTED NOT DETECTED Final   Respiratory Syncytial Virus DETECTED (A) NOT DETECTED Final    Comment: CRITICAL RESULT CALLED TO, READ BACK BY AND VERIFIED WITH: HS Lee RN 17:25 09/18/17 ([email or web address])    Bordetella pertussis NOT DETECTED NOT DETECTED Final   Chlamydophila pneumoniae NOT DETECTED NOT DETECTED Final   Mycoplasma pneumoniae NOT DETECTED NOT DETECTED Final    Comment: Performed at [facility], [place] [address], [place], [place] [address]  Culture, body fluid-bottle     Status: None (Preliminary result)   Collection Time: 09/22/17  2:01 PM  Result Value Ref Range Status   Specimen Description PERITONEAL  Final   Special Requests BOTTLES DRAWN AEROBIC AND ANAEROBIC  Final   Culture   Final    NO GROWTH 4 DAYS Performed at [facility], [place] [address],  [place], [place] [address]    Report Status PENDING  Incomplete  Gram stain     Status: None   Collection Time: 09/22/17  2:01 PM  Result Value Ref  Range Status   Specimen Description PERITONEAL  Final   Special Requests NONE  Final   Gram Stain   Final    NO WBC SEEN NO ORGANISMS SEEN Performed at [facility], [address] [place], [place] [address]    Report Status 09/22/2017 FINAL  Final         Radiology Studies: Dg Cervical Spine 2 Or 3 Views  Result Date: 09/25/2017 CLINICAL DATA:  72 year old female with new onset midline cervical spine pain radiating to the right side. Soft tissue swelling and erythema. EXAM: CERVICAL SPINE - 2-3 VIEW COMPARISON:  Cervical spine CT 12/07/2016. FINDINGS: C4-C5 and C5-C6 anterior and posterior fusion sequelae is stable. Hardware appears stable and intact. Stable and relatively preserved cervical lordosis. Normal prevertebral soft tissue contour. Normal AP alignment. No acute osseous abnormality identified. No subcutaneous gas. No discrete neck soft tissue abnormality is evident. Negative visible upper chest. Partially visible shoulder arthroplasty on the lateral view. IMPRESSION: Stable since 2018 and negative postoperative radiographic appearance of the cervical spine. Electronically Signed   By: Genevie Ann M.D.   On: 09/25/2017 11:26   Dg Chest Port 1 View  Result Date: 09/27/2017 CLINICAL DATA:  72 year old female with recurrent respiratory failure. Right lower lobe pneumonia, positive RSV. EXAM: PORTABLE CHEST 1 VIEW COMPARISON:  09/23/2017 and earlier. FINDINGS: Portable AP semi upright view at 0350 hrs. Stable lung volumes. Mediastinal contours remain normal. Visualized tracheal air column is within normal limits. No pneumothorax. Largely resolved confluent right lung base opacity since 09/18/2017. Mild patchy bibasilar opacity today. No new confluent opacity. Mildly increased pulmonary vascularity but no overt edema. No pleural effusion is  evident. Paucity of bowel gas in the upper abdomen. Prior cervical spine fusion and right shoulder arthroplasty. IMPRESSION: 1. Largely resolved right lung pneumonia since 09/18/2017. 2. Mild bibasilar opacity now most resembles atelectasis. 3. Increased pulmonary vascularity since 09/23/2017 but no overt edema. Electronically Signed   By: Genevie Ann M.D.   On: 09/27/2017 07:01        Scheduled Meds: . allopurinol  300 mg Oral Daily  . bisoprolol  10 mg Oral Q supper  . budesonide  0.25 mg Nebulization BID  . chlorhexidine  15 mL Mouth Rinse BID  . citalopram  20 mg Oral Q supper  . gabapentin  400 mg Oral TID  . ipratropium-albuterol  3 mL Nebulization TID  . lactulose  10 g Oral Daily  . mouth rinse  15 mL Mouth Rinse q12n4p  . metolazone  5 mg Oral BID  . polyethylene glycol  17 g Oral Daily  . potassium chloride  20 mEq Oral 2 times per day  . rifampin  300 mg Oral BID WC  . sodium chloride flush  10-40 mL Intracatheter Q12H  . spironolactone  150 mg Oral Daily   Continuous Infusions:    LOS: 11 days    Time spent: 40 minutes    Khyan Oats, Geraldo Docker, MD [facility] Pager [phone]   If 7PM-7AM, please contact night-coverage [email or web address] Password [facility] 09/27/2017, 8:31 AM

## 2017-09-27 NOTE — Progress Notes (Incomplete)
Pt. Will be able to have a thoracentesis outpatient scheduled with [facility].

## 2017-09-27 NOTE — Care Management Important Message (Signed)
Important Message  Patient Details  Name: [name] MRN: [id number] Date of Birth: 08/04/1946   Medicare Important Message Given:  Yes    Erenest Rasher, RN 09/27/2017, 4:58 PM

## 2017-09-28 DIAGNOSIS — N183 Chronic kidney disease, stage 3 unspecified: Secondary | ICD-10-CM

## 2017-09-28 DIAGNOSIS — E662 Morbid (severe) obesity with alveolar hypoventilation: Secondary | ICD-10-CM

## 2017-09-28 DIAGNOSIS — J181 Lobar pneumonia, unspecified organism: Secondary | ICD-10-CM

## 2017-09-28 DIAGNOSIS — K7460 Unspecified cirrhosis of liver: Secondary | ICD-10-CM

## 2017-09-28 DIAGNOSIS — J189 Pneumonia, unspecified organism: Secondary | ICD-10-CM

## 2017-09-28 DIAGNOSIS — D539 Nutritional anemia, unspecified: Secondary | ICD-10-CM

## 2017-09-28 DIAGNOSIS — I5032 Chronic diastolic (congestive) heart failure: Secondary | ICD-10-CM

## 2017-09-28 DIAGNOSIS — K7581 Nonalcoholic steatohepatitis (NASH): Secondary | ICD-10-CM

## 2017-09-28 DIAGNOSIS — J90 Pleural effusion, not elsewhere classified: Secondary | ICD-10-CM

## 2017-09-28 DIAGNOSIS — J121 Respiratory syncytial virus pneumonia: Secondary | ICD-10-CM

## 2017-09-28 DIAGNOSIS — G4733 Obstructive sleep apnea (adult) (pediatric): Secondary | ICD-10-CM

## 2017-09-28 LAB — CBC
HEMATOCRIT: 30 % — AB (ref 36.0–46.0)
Hemoglobin: 9 g/dL — ABNORMAL LOW (ref 12.0–15.0)
MCH: 31.3 pg (ref 26.0–34.0)
MCHC: 30 g/dL (ref 30.0–36.0)
MCV: 104.2 fL — AB (ref 78.0–100.0)
Platelets: 45 10*3/uL — ABNORMAL LOW (ref 150–400)
RBC: 2.88 MIL/uL — ABNORMAL LOW (ref 3.87–5.11)
RDW: 16.2 % — ABNORMAL HIGH (ref 11.5–15.5)
WBC: 5.3 10*3/uL (ref 4.0–10.5)

## 2017-09-28 LAB — BASIC METABOLIC PANEL
ANION GAP: 13 (ref 5–15)
BUN: 30 mg/dL — AB (ref 6–20)
CO2: 33 mmol/L — AB (ref 22–32)
Calcium: 8.5 mg/dL — ABNORMAL LOW (ref 8.9–10.3)
Chloride: 86 mmol/L — ABNORMAL LOW (ref 101–111)
Creatinine, Ser: 1.45 mg/dL — ABNORMAL HIGH (ref 0.44–1.00)
GFR calc Af Amer: 41 mL/min — ABNORMAL LOW (ref >60)
GFR, EST NON AFRICAN AMERICAN: 36 mL/min — AB (ref >60)
GLUCOSE: 134 mg/dL — AB (ref 65–99)
POTASSIUM: 3.7 mmol/L (ref 3.5–5.1)
Sodium: 132 mmol/L — ABNORMAL LOW (ref 135–145)

## 2017-09-28 LAB — MAGNESIUM: Magnesium: 1.9 mg/dL (ref 1.7–2.4)

## 2017-09-28 MED ORDER — POTASSIUM CHLORIDE CRYS ER 20 MEQ PO TBCR: 20.0000 meq | EXTENDED_RELEASE_TABLET | Freq: Two times a day (BID) | ORAL | 0 refills | Status: DC

## 2017-09-28 MED ORDER — SPIRONOLACTONE 50 MG PO TABS: 150.0000 mg | ORAL_TABLET | Freq: Every day | ORAL | 0 refills | Status: DC

## 2017-09-28 MED ORDER — TORSEMIDE 20 MG PO TABS: 30.0000 mg | ORAL_TABLET | ORAL | Status: DC

## 2017-09-28 MED ORDER — TRAMADOL HCL 50 MG PO TABS: ORAL_TABLET | ORAL | 0 refills | Status: DC

## 2017-09-28 MED ORDER — ALLOPURINOL 300 MG PO TABS: ORAL_TABLET | ORAL | 0 refills | Status: DC

## 2017-09-28 MED ORDER — GABAPENTIN 400 MG PO CAPS: 400.0000 mg | ORAL_CAPSULE | Freq: Three times a day (TID) | ORAL | 0 refills | Status: DC

## 2017-09-28 MED ORDER — TORSEMIDE 20 MG PO TABS: 60.0000 mg | ORAL_TABLET | Freq: Every evening | ORAL | Status: DC

## 2017-09-28 MED ORDER — CITALOPRAM HYDROBROMIDE 20 MG PO TABS: 20.0000 mg | ORAL_TABLET | Freq: Every day | ORAL | 0 refills | Status: DC

## 2017-09-28 NOTE — Progress Notes (Signed)
[facility] made aware of DC, requested [facility] w F2F orders from Dr Sherral Hammers

## 2017-09-28 NOTE — Discharge Summary (Signed)
Physician Discharge Summary  [name] EML:[id number] DOB: 02-01-1946 DOA: 09/16/2017  PCP: Unk Pinto, MD  Admit date: 09/16/2017 Discharge date: 09/28/2017  Time spent: 35 minutes  Recommendations for Outpatient Follow-up:   Acute on Chronic Respiratory Failure with Hypoxia -discharged on 2/8  On 4L O2 via [place]. Currently on 5 L O2 via  at rest -Multifactorial COPD exacerbation, positive RSV pneumonia, HCAP, recurrent RIGHT pleural effusion, ascites.  -Titrate O2 to maintain SPO2 89-93%  -CPAP/BiPAP  QHS PER RESPIRATORY TO MAINTAIN SPO2 89 -93%  -See RSV pneumonia   COPD exacerbation -See respiratory failure See respiratory failure   OSA/OHS -CPAP per respiratory -Patient requires Trilogy prior to discharge. Patient at high risk of early readmission without Trilogy machine. Unlikely remain out of the hospital long enough for sleep study without machine. -Titrate O2 to maintain SPO2 89-93% -Patient continues to exhibit signs of hypercapnia associated with chronic respiratory failure secondary to severe COPD. Patient requires the use of NIV both QHS and daytime to help with exacerbation periods. The use of the NIV Will treat patient's high PCO2 levels and can reduce risk of exacerbations and future hospitalizations when used at night and during the day. Patient will need these advanced settings in conjunction with her current medication regimen: BiPAP is not an option due to his functional limitations and the severity of the patient's condition. Failure to have NIV available for use over 24 hour period could lead to death.   RLL pneumonia (HCAP)//positive RSV pneumonia -2/13 PCXR consistent with RLL pneumonia. Although patient negative leukocytosis patient is immunocompromised with HCC/liver failure may not mount immune response. Given that second hospitalization within 7 days. Start pneumonia protocol. 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 -S/P RIGHT Thoracentesis: 900 ml aspirated.Pathology negative for malignant cells./culture and stain pending -GI DOES NOT recommend  TIPS AT THIS TIME. ON 2/17 SIGNED OFF.   -Pathology: No malignant cells   Nonalcoholic Liver Cirrhosis /[place]/Hepatocellular carcinoma -Check ammonia level sporadically to ensure not becoming elevated.  -Lactulose 10 g daily  -Follow-up with PA Amy Esterwood on 3/1 @1430  . Dr. Norvel Richards PA at [facility] for thoracentesis/Paracentesis will most likely require q 2-3 week outpatient appointments. Recurrent Pleural effusion, ascites secondary to [facility], hepatocellular carcinoma    Ascites -2/17 S/P paracentesis 2.6 L   Liver lesions (2) suspicious for [facility].  -Oncologist Dr. Truitt Merle per EMR last saw patient 07/10/2017 -08/30/2017 S/P CT guided Percutaneous Radiofrequency (MICROWAVE) Ablation of segment 6 Hepatic lesion -Per oncology note not candidate for surgery for liver transplant secondary to comorbidities -Per oncology patient should follow-up in 6-12 months post IR procedure.    Esophageal Varices, Hepatic Encephalopathy( Child Pugh score 6 (class A) -f/u with Dr. Enis Gash  see nonalcoholic liver cirrhosis.   Right neck mass -Patient with new onset C-spine/right soft tissue swelling. X-ray shows negative discrete soft tissue abnormality.   Chronic diastolic CHF (base weight 84.5 kg at discharge on 2/8) -Strict in and out since admission -3.3 L -Daily weight Filed Weights   09/26/17 0432 09/27/17 0606 09/28/17 7121  Weight: 192 lb 0.3 oz (87.1 kg) 199 lb 1.2 oz (90.3 kg) 182 lb 8.7 oz (82.8 kg)  -home dose Torsemide -Patient to hold home Zaroxolyn until seen by PCP who will restart if appropriate. Patient's creatinine currently slightly above baseline. -2/22 PCXR pending  Macrocytic anemia -B-12/folate pending    Macrocytic anemia -Check E42 and folic acid    CKD stage III (baseline Cr 1.3) -Follow Cr closely with diuretics Recent Labs  Lab 09/22/17 0250 09/23/17 0542 09/24/17 0218 09/25/17 0304 09/27/17 1456 09/28/17 1025  CREATININE 1.32* 1.30* 1.31* 1.36* 1.38* 1.45*  -See CHF   Obesity -Body mass index is 36.09 kg/m    Discharge Diagnoses:  Principal Problem:   Acute on chronic respiratory failure with hypoxemia (HCC) Active Problems:   Anemia   Cirrhosis (HCC)   Essential hypertension   Goals of care, counseling/discussion   Chronic respiratory failure (HCC)   Obesity (BMI 30-39.9)   COPD exacerbation (HCC)   Anasarca   Hepatocellular carcinoma (HCC)   Ascites   Pleural effusion   Palliative care by specialist   Hypoxia   Discharge Condition: Guarded  Diet recommendation: Heart healthy fluid consistency thin  Filed Weights   09/26/17 0432 09/27/17 0606 09/28/17 3536  Weight: 192 lb 0.3 oz (87.1 kg) 199 lb 1.2 oz (90.3 kg) 182 lb 8.7 oz (82.8 kg)    History of present illness:  72 y.o.WF PMHx Depression ,Diabetes mellitus without complication (diet controlled) COPD on 2-3 L O2 at home, non-alcohol related liver cirrhosis, Esophageal varices,Hepatic encephalopathy , and Hepatocellular Carcinoma diagnosed Nov 2018 s/p CT guided ablation with hospitalization 2/2 >>> 09/13/17 due to anasarca and Acute on Chronic Respiratory failure s/p thoracentesis Asthma, Fibromyalgia, HLD, HTN, IBS,Splenomegaly, Vitamin D deficiency who returned to the ED 2/11 with recurrent respiratory failure.  CXR showed re-accumulation of a right-sided pleural effusion.Marland Kitchen      Hospital Course:   Procedures: 2/8 d/c  2/11 re-admit w/ rapidly re-accumulated pleural effusion  2/12 S/P RIGHT Thoracentesis 900 ml aspirated 2/17 paracentesis in Radiology - 2.6L     Consultations: IR PALLIATIVE [facility]    Cultures  2/11 MRSA by PCR negative 2/12  RIGHT pleural fluid negative malignant cells: Culture pending 2/13 respiratory virus panel pending 2/13 sputum pending 2/13 HIV negative 2/13 strep pneumo pending 2/13 respiratory virus panel positive RSV   Antibiotics Anti-infectives (From admission, onward)   Start     Stop   09/19/17 0100  vancomycin (VANCOCIN) IVPB 750 mg/150 ml premix  Status:  Discontinued     09/20/17 1025   09/18/17 1230  ceFEPIme (MAXIPIME) 2 g in sodium chloride 0.9 % 100 mL IVPB     09/22/17 2359   09/18/17 1215  vancomycin (VANCOCIN) 1,750 mg in sodium chloride 0.9 % 500 mL IVPB     09/18/17 1636   09/16/17 1700  rifampin (RIFADIN) capsule 300 mg             Discharge Exam: Vitals:   09/28/17 0632 09/28/17 0748 09/28/17 0806 09/28/17 1152  BP:   (!) 101/41 (!) 110/47  Pulse:   76 76  Resp:   13 16  Temp:   97.6 F (36.4 C) 98.5 F (36.9 C)  TempSrc:   Oral Oral  SpO2:  96% 98% 98%  Weight: 182 lb 8.7 oz (82.8 kg)     Height:        General: A/O 4, acute on chronic respiratory distress Neck:  Negative scars, masses, torticollis, lymphadenopathy, JVD Lungs: tachypnea positive diffuse expiratory wheeze, negative crackles Cardiovascular: Tachycardic, Regular rhythm without murmur gallop or rub normal S1 and S2    Discharge Instructions   Allergies as of 09/28/2017      Reactions   Atorvastatin Other (See Comments)   Unknown reaction  Diphenhydramine Hcl (sleep) Hives   Hydrocodone-acetaminophen Other (See Comments)   Unknown reaction   Lopid [gemfibrozil] Other (See Comments)   Unknown reaction   Loratadine Hives   Lorazepam Hives   Simvastatin Other (See Comments)   Unknown reaction   Sulfamethoxazole Hives   Sulfonamide Derivatives Hives      Medication List    STOP taking these medications   amoxicillin-clavulanate 875-125 MG tablet Commonly known as:  AUGMENTIN   gabapentin 600 MG tablet Commonly known as:  NEURONTIN Replaced by:  gabapentin 400 MG capsule    metolazone 5 MG tablet Commonly known as:  ZAROXOLYN     TAKE these medications   albuterol 108 (90 Base) MCG/ACT inhaler Commonly known as:  PROVENTIL HFA;VENTOLIN HFA Inhale 2 puffs into the lungs every 6 (six) hours as needed for wheezing or shortness of breath.   allopurinol 300 MG tablet Commonly known as:  ZYLOPRIM take 1 tablet (300 mg) by mouth daily with supper - for gout   aspirin EC 81 MG tablet Take 81 mg by mouth at bedtime.   bisoprolol 10 MG tablet Commonly known as:  ZEBETA Take 1 tablet (10 mg total) by mouth daily. What changed:  when to take this   budesonide 0.25 MG/2ML nebulizer solution Commonly known as:  PULMICORT Take 2 mLs (0.25 mg total) by nebulization 2 (two) times daily.   citalopram 20 MG tablet Commonly known as:  CELEXA Take 1 tablet (20 mg total) by mouth daily with supper. What changed:    medication strength  how much to take  when to take this   cyclobenzaprine 10 MG tablet Commonly known as:  FLEXERIL Take 1 tablet (10 mg total) by mouth 3 (three) times daily as needed for muscle spasms.   dextromethorphan-guaiFENesin 30-600 MG 12hr tablet Commonly known as:  MUCINEX DM Take 1 tablet by mouth 2 (two) times daily as needed for cough.   ferrous sulfate 325 (65 FE) MG tablet Take 325 mg by mouth daily.   gabapentin 400 MG capsule Commonly known as:  NEURONTIN Take 1 capsule (400 mg total) by mouth 3 (three) times daily. Replaces:  gabapentin 600 MG tablet   glucose blood test strip Take sugars once daily   ipratropium 0.02 % nebulizer solution Commonly known as:  ATROVENT use 1 vial in nebulizer EVERY 4 HOURS AS NEEDED FOR WHEEZING OR SHORTNESS OF BREATH 75/15=5   lactulose 10 GM/15ML solution Commonly known as:  CHRONULAC take 32ms TWICE DAILY What changed:    how much to take  how to take this  when to take this  additional instructions   Magnesium 250 MG Tabs Take 250 mg by mouth at bedtime.    NONFORMULARY OR COMPOUNDED ITEM Apply 1 application topically See admin instructions. SALICYLIC ACID ACID (PETROLATUM) 50% OINTMENT-APPLY TO AFFECTED AREA AT BEDTIME PRN DRY SKIN/LEGS (Compounded at [facility]   nystatin cream Commonly known as:  MYCOSTATIN Apply 1 application topically 2 (two) times daily. What changed:    when to take this  reasons to take this   ondansetron 4 MG tablet Commonly known as:  ZOFRAN Take 1 tablet (4 mg total) by mouth every 6 (six) hours as needed for nausea or vomiting.   OXYGEN Inhale 2-3 L into the lungs continuous. 3 L with exertion   pantoprazole 40 MG tablet Commonly known as:  PROTONIX Take 1 tablet (40 mg total) by mouth daily. What changed:  when to take this   potassium chloride  SA 20 MEQ tablet Commonly known as:  K-DUR,KLOR-CON Take 1 tablet (20 mEq total) by mouth 2 (two) times daily. What changed:    medication strength  how much to take  when to take this   rifampin 300 MG capsule Commonly known as:  RIFADIN Take 1 capsule 2 x / day for Liver What changed:    how much to take  how to take this  when to take this  additional instructions   spironolactone 50 MG tablet Commonly known as:  ALDACTONE Take 3 tablets (150 mg total) by mouth daily. Start taking on:  09/29/2017   terconazole 0.4 % vaginal cream Commonly known as:  TERAZOL 7 Place 1 applicator vaginally at bedtime. What changed:    when to take this  reasons to take this   torsemide 10 MG tablet Commonly known as:  DEMADEX Take 3 tablets (30 mg total) by mouth every morning.   torsemide 20 MG tablet Commonly known as:  DEMADEX Take 3 tablets (60 mg total) by mouth every evening.   traMADol 50 MG tablet Commonly known as:  ULTRAM Take 50 mg by mouth every 12 hours as needed for pain What changed:  additional instructions   triamcinolone cream 0.1 % Commonly known as:  KENALOG Apply 1 application topically 3 (three) times  daily as needed for pain What changed:    how much to take  how to take this  when to take this  reasons to take this  additional instructions   Vitamin D3 5000 units Caps Take 5,000 Units by mouth daily with breakfast.      Allergies  Allergen Reactions  . Atorvastatin Other (See Comments)    Unknown reaction  . Diphenhydramine Hcl (Sleep) Hives  . Hydrocodone-Acetaminophen Other (See Comments)    Unknown reaction  . Lopid [Gemfibrozil] Other (See Comments)    Unknown reaction  . Loratadine Hives  . Lorazepam Hives  . Simvastatin Other (See Comments)    Unknown reaction  . Sulfamethoxazole Hives  . Sulfonamide Derivatives Hives   Follow-up Information    Alfredia Ferguson, PA-C Follow up on 10/04/2017.   Specialty:  Gastroenterology Why:  2:30 PM with Dr Doyne Keel PA.   Contact information: [place] [id number] [phone]            The results of significant diagnostics from this hospitalization (including imaging, microbiology, ancillary and laboratory) are listed below for reference.    Significant Diagnostic Studies: Dg Chest 1 View  Result Date: 09/17/2017 CLINICAL DATA:  Status post left-sided thoracentesis. History of asthma-COPD, former smoker. EXAM: CHEST 1 VIEW COMPARISON:  Portable chest x-ray of September 16, 2017 FINDINGS: Aeration of the right lung has improved since the thoracentesis. A small amount of pleural fluid persists. There is no postprocedure pneumothorax. The left lung remains well expanded. There is patchy density at the left base. The cardiac silhouette is top-normal in size. The central pulmonary vascularity is mildly prominent. There is calcification in the wall of the aortic arch. IMPRESSION: No postprocedure complication following right-sided thoracentesis. Small right pleural effusion remains. Underlying COPD.  Left basilar atelectasis or infiltrate. Thoracic aortic atherosclerosis. Electronically Signed   By:  David  Martinique M.D.   On: 09/17/2017 13:12   Dg Chest 1 View  Result Date: 09/12/2017 CLINICAL DATA:  Status post thoracentesis EXAM: CHEST 1 VIEW COMPARISON:  09/10/2017 radiographs FINDINGS: Reduced size of right pleural effusion. No visible pneumothorax. Re-expansion of significant portions of the right  lung. There is some persistent peripheral opacity at the right lung base likely representing residual effusion. The left lung remains clear. Cervical plate and screw fixators. Right proximal humeral prosthesis. Aortoiliac atherosclerotic vascular disease. IMPRESSION: 1. Significant reduction in size of the right pleural effusion and associated re-expansion of portions of the right lung. Small persistent effusion peripherally at the right lung base. No pneumothorax or complicating feature. 2.  Aortic Atherosclerosis (ICD10-I70.0). Electronically Signed   By: Van Clines M.D.   On: 09/12/2017 12:05   Dg Chest 1 View  Result Date: 08/30/2017 CLINICAL DATA:  Preop ablation EXAM: CHEST 1 VIEW COMPARISON:  12/07/2016 FINDINGS: Heart is upper limits normal in size. Minimal bibasilar atelectasis or scarring. No effusions. No acute bony abnormality. IMPRESSION: Minimal bibasilar atelectasis or scarring. Electronically Signed   By: Rolm Baptise M.D.   On: 08/30/2017 08:44   Dg Chest 2 View  Result Date: 09/07/2017 CLINICAL DATA:  Chest pain EXAM: CHEST  2 VIEW COMPARISON:  08/30/2017 FINDINGS: Small to moderate layering right pleural effusion. Left lung is clear. No frank interstitial edema. No pneumothorax. The heart is normal in size. Mild degenerative changes of the visualized thoracolumbar spine. L1 vertebral augmentation. Cervical spine fixation hardware. IMPRESSION: Small to moderate layering right pleural effusion, new. Electronically Signed   By: Julian Hy M.D.   On: 09/07/2017 15:49   Dg Cervical Spine 2 Or 3 Views  Result Date: 09/25/2017 CLINICAL DATA:  72 year old female with new  onset midline cervical spine pain radiating to the right side. Soft tissue swelling and erythema. EXAM: CERVICAL SPINE - 2-3 VIEW COMPARISON:  Cervical spine CT 12/07/2016. FINDINGS: C4-C5 and C5-C6 anterior and posterior fusion sequelae is stable. Hardware appears stable and intact. Stable and relatively preserved cervical lordosis. Normal prevertebral soft tissue contour. Normal AP alignment. No acute osseous abnormality identified. No subcutaneous gas. No discrete neck soft tissue abnormality is evident. Negative visible upper chest. Partially visible shoulder arthroplasty on the lateral view. IMPRESSION: Stable since 2018 and negative postoperative radiographic appearance of the cervical spine. Electronically Signed   By: Genevie Ann M.D.   On: 09/25/2017 11:26   Dg Abd 1 View  Result Date: 09/19/2017 CLINICAL DATA:  Cirrhosis.  Ascites.  Abdominal distension. EXAM: ABDOMEN - 1 VIEW COMPARISON:  09/13/2017 FINDINGS: Moderate gaseous distention of the stomach is again demonstrated. Scattered air in the colon and small bowel but no findings for obstruction or perforation. Morbid obesity. The lung bases are grossly clear. Bilateral total hip arthroplasties, lumbar fusion hardware and prior vertebral augmentation changes at L1 IMPRESSION: Moderate gaseous distention of the stomach. No findings for small bowel obstruction or free air. Electronically Signed   By: Marijo Sanes M.D.   On: 09/19/2017 17:46   Ct Abdomen Pelvis W Contrast  Result Date: 09/07/2017 CLINICAL DATA:  Abdominal pain, swelling and shortness of breath. Status post liver cancer surgery. EXAM: CT ABDOMEN AND PELVIS WITH CONTRAST TECHNIQUE: Multidetector CT imaging of the abdomen and pelvis was performed using the standard protocol following bolus administration of intravenous contrast. CONTRAST:  86m ISOVUE-300 IOPAMIDOL (ISOVUE-300) INJECTION 61% COMPARISON:  Liver ablation CT dated 08/30/2017. Chest CT dated 07/02/2017. Abdomen and pelvis CT  dated 07/11/2016. Abdomen and pelvis CT dated 07/01/2012. FINDINGS: Lower chest: Interval moderate-sized right pleural effusion with right lower lobe atelectasis. Hepatobiliary: Stable nodular liver contours with a small right lobe and enlarged lateral segment left lobe and caudate lobe. Interval post ablation changes in the right lobe and lateral segment left  lobe. No visible mass separate from these changes at either location. Cholecystectomy clips. Pancreas: Unremarkable. No pancreatic ductal dilatation or surrounding inflammatory changes. Spleen: Diffusely enlarged, without significant change. Adrenals/Urinary Tract: Stable dilatation of the right renal collecting system without ureteral dilatation. A cortical scar in the mid to upper right kidney containing a 6 mm calcification is unchanged. There is also an additional 5 mm right renal calculus more inferiorly without significant change. A 2.0 cm left renal cyst is unchanged. There is also a 1.4 cm oval low density in the medial aspect of the kidney measuring 25 Hounsfield units in density, without significant change since 07/11/2016. Portions of the urinary bladder obscured by streak artifacts from bilateral hip prostheses with no visible bladder abnormality. No visible ureteral calculi or dilatation. Stomach/Bowel: Mild fat density wall thickening involving the right colon is unchanged. Multiple proximal small bowel loops near the upper limit of normal in size are again demonstrated with normal caliber distal small bowel and normal caliber colon. No evidence of appendicitis. Vascular/Lymphatic: Atheromatous arterial calcifications without aneurysm. No enlarged lymph nodes. Reproductive: Status post hysterectomy. No adnexal masses. Other: Small to moderate amount of free peritoneal fluid. Diffuse subcutaneous edema. Musculoskeletal: Old, healed right rib fractures. Approximately 30% L1 vertebral compression deformity with kyphoplasty material. Interbody and  pedicle screw and rod fixation at the L4 through S1 levels. Lumbar and lower thoracic spine degenerative changes. Bilateral hip prostheses. IMPRESSION: 1. Interval moderate-sized right pleural effusion with right lower lobe atelectasis. 2. Stable changes of cirrhosis of the liver with interval post ablation changes and associated splenomegaly and ascites. 3. Diffuse subcutaneous edema, compatible with hypoproteinemia. 4. Stable chronic right UPJ obstruction and nonobstructing right renal calculus. 5. Stable simple left renal cyst and probable complicated left renal cyst. Electronically Signed   By: Claudie Revering M.D.   On: 09/07/2017 17:17   US Abdomen Limited  Result Date: 09/08/2017 CLINICAL DATA:  History of cirrhosis. Please perform abdominal ultrasound and ultrasound-guided paracentesis as indicated. EXAM: LIMITED ABDOMEN ULTRASOUND FOR ASCITES TECHNIQUE: Limited ultrasound survey for ascites was performed in all four abdominal quadrants. COMPARISON:  CT abdomen pelvis-09/07/2017; CT-guided hepatic lesion microwave ablation - 08/30/2017 FINDINGS: Provided images of the abdomen demonstrates only a trace amount of fluid within the abdomen, too small to allow for safe ultrasound-guided paracentesis. IMPRESSION: Trace amount of intra-abdominal ascites, too small to allow for safe ultrasound-guided paracentesis. Electronically Signed   By: Sandi Mariscal M.D.   On: 09/08/2017 09:38   US Paracentesis  Result Date: 09/22/2017 INDICATION: History of cirrhosis and newly diagnosed with hepatocellular carcinoma with abdominal ascites. Request is made for diagnostic and therapeutic paracentesis. EXAM: ULTRASOUND GUIDED DIAGNOSTIC AND THERAPEUTIC PARACENTESIS MEDICATIONS: 1% Xylocaine COMPLICATIONS: None immediate. PROCEDURE: Informed written consent was obtained from the patient after a discussion of the risks, benefits and alternatives to treatment. A timeout was performed prior to the initiation of the procedure.  Initial ultrasound scanning demonstrates a small amount of ascites within the left lower abdominal quadrant. The left lower abdomen was prepped and draped in the usual sterile fashion. 1% Xylocaine was used for local anesthesia. Following this, a 19 gauge, 7-cm, Yueh catheter was introduced. An ultrasound image was saved for documentation purposes. The paracentesis was performed. The catheter was removed and a dressing was applied. The patient tolerated the procedure well without immediate post procedural complication. FINDINGS: A total of approximately 2.6 L of slightly yellow chylous fluid was removed. Samples were sent to the laboratory as requested by the clinical  team. IMPRESSION: Successful ultrasound-guided paracentesis yielding 2.6 liters of peritoneal fluid. Read by: Saverio Danker, PA-C Electronically Signed   By: Jerilynn Mages.  Shick M.D.   On: 09/22/2017 14:26   Ct Guide Tissue Ablation  Result Date: 08/30/2017 CLINICAL DATA:  Multifocal biopsy-proven hepatocellular carcinoma in the setting of NASH cirrhosis. EXAM: CT-GUIDED PERCUTANEOUS RADIOFREQUENCY (MICROWAVE) ABLATION OF SEGMENT 6 HEPATIC LESION CT-GUIDED PERCUTANEOUS RADIOFREQUENCY (MICROWAVE) ABLATION OF SEGMENT 2 HEPATIC LESION COMPARISON:  MR 06/25/2017 ANESTHESIA/SEDATION: Anesthesia:  General Medications: 1 gram IVzosyn 3.375 g. The antibiotic was administered in an appropriate time interval prior to needle puncture of the skin. CONTRAST:  100 mL Isovue 370 IV PROCEDURE: The procedure, risks, benefits, and alternatives were explained to the patient and spouse. Questions regarding the procedure were encouraged and answered. The patient understands and consents to the procedure. The patient was placed under general anesthesia. Initial unenhanced CT was performed in a left posterior oblique position to localize the liver lesions. These were in apparent on unenhanced scanning. Accordingly, CT abdomen after IV contrast administration acquiring arterial and  venous phase imaging was performed. The lesions were localized. The upper abdomen was prepped with chlorhexidine in a sterile fashion, and a sterile drape was applied covering the operative field. A sterile gown and sterile gloves were used for the procedure. Under CT guidance, a NEUWAVET PR XT Probe was advanced at the inferior margin of the segment 6 lesion. A second NEUWAVET PR XT Probe was advanced to the superior margin of the segment 6 lesion. Probe positioning was confirmed by CT fusion software prior to ablation. Microwave ablation was performed through both probes concurrently using standard 5 minute cycle. The needles were slowly removed using the tract ablation option. Subsequently, under CT guidance, a NEUWAVET PR XT Probe was advanced across the posterior margin of the segment 2 lesion. A second NEUWAVET PR XT Probe was advanced across the anterior margin of the segment 2 lesion. Probe positioning was confirmed by CT fusion software prior to ablation. For body wall protection, a 5 French multi sidehole Yueh sheath needle was advanced into the peritoneal space anterior to the left hepatic lobe. 300 mL sterile saline were introduced, and during continued drip infusion, Microwave ablation was performed through both probes concurrently using standard 5 minute cycle. The needles were removed. Post-procedural contrast-enhanced abdominal CT was performed. The patient tolerated the procedure well. Patient was extubated and transferred to PACU. COMPLICATIONS: None immediate FINDINGS: Localizing contrast-enhanced CT demonstrates 2.6 cm hepatic segment 6 lesion which is hypervascular on arterial phase, and a similarly hypervascular 2.2 cm segment 2 lesion, corresponding to the findings seen on previous MRI. No new lesions are evident. Stable subcentimeter segment 6 cyst. A nodular liver contour consistent with known cirrhosis. Trace perisplenic ascites. Splenomegaly. Cholecystectomy clips noted. Coarse pancreatic  parenchymal calcifications. On post treatment CT fusion analysis, the ablation zones encompassed the entirety of both lesions. No evidence of hemorrhage or other complication. IMPRESSION: 1. Technically successful CT guided percutaneous microwave ablation of hepatic segment 6 hepatocellular carcinoma. 2. Technically successful CT-guided percutaneous microwave ablation of hepatic segment 2 hepatocellular carcinoma. 3. The patient will be observed overnight. Initial follow-up will be performed in approximately 4 weeks. Electronically Signed   By: Lucrezia Europe M.D.   On: 08/30/2017 14:25   Dg Chest Port 1 View  Result Date: 09/27/2017 CLINICAL DATA:  72 year old female with recurrent respiratory failure. Right lower lobe pneumonia, positive RSV. EXAM: PORTABLE CHEST 1 VIEW COMPARISON:  09/23/2017 and earlier. FINDINGS: Portable AP semi upright  view at 0350 hrs. Stable lung volumes. Mediastinal contours remain normal. Visualized tracheal air column is within normal limits. No pneumothorax. Largely resolved confluent right lung base opacity since 09/18/2017. Mild patchy bibasilar opacity today. No new confluent opacity. Mildly increased pulmonary vascularity but no overt edema. No pleural effusion is evident. Paucity of bowel gas in the upper abdomen. Prior cervical spine fusion and right shoulder arthroplasty. IMPRESSION: 1. Largely resolved right lung pneumonia since 09/18/2017. 2. Mild bibasilar opacity now most resembles atelectasis. 3. Increased pulmonary vascularity since 09/23/2017 but no overt edema. Electronically Signed   By: Genevie Ann M.D.   On: 09/27/2017 07:01   Dg Chest Port 1 View  Result Date: 09/23/2017 CLINICAL DATA:  Pleural effusion.  Shortness of breath and cough EXAM: PORTABLE CHEST 1 VIEW COMPARISON:  Two days ago FINDINGS: Airspace type opacity at the medial right base. Reticulonodular density at the left base. Normal heart size and mediastinal contours. No edema, effusion, or pneumothorax.  IMPRESSION: 1. Pneumonia or atelectasis at the bases. 2. No visible pleural effusion. 3. No significant change from 2 days prior. Electronically Signed   By: Monte Fantasia M.D.   On: 09/23/2017 07:52   Dg Chest Port 1 View  Result Date: 09/21/2017 CLINICAL DATA:  Pleural effusion EXAM: PORTABLE CHEST 1 VIEW COMPARISON:  09/18/2017 FINDINGS: Mild improvement in right lower lobe infiltrate. No effusion. Minimal left lower lobe atelectasis/infiltrate unchanged. Negative for heart failure or edema. IMPRESSION: Improvement in right lower lobe airspace disease. Electronically Signed   By: Franchot Gallo M.D.   On: 09/21/2017 08:31   Dg Chest Port 1 View  Result Date: 09/18/2017 CLINICAL DATA:  Pleural effusion follow-up EXAM: PORTABLE CHEST 1 VIEW COMPARISON:  09/17/2017, 09/16/2017 FINDINGS: Right lower lobe infiltrate has progressed. Improvement in right effusion. Minimal left lower lobe airspace disease unchanged. Negative for heart failure. No effusion on the left. IMPRESSION: Progression of right lower lobe infiltrate, probable pneumonia. Improvement in right pleural effusion. Electronically Signed   By: Franchot Gallo M.D.   On: 09/18/2017 08:11   Dg Chest Port 1 View  Result Date: 09/16/2017 CLINICAL DATA:  Patient status post thoracentesis 09/12/2017 for a right pleural effusion. Severe shortness of breath today. EXAM: PORTABLE CHEST 1 VIEW COMPARISON:  Single-view of the chest 09/12/2017. FINDINGS: The patient has a moderately large right pleural effusion which has markedly increased in size since the comparison examination. Associated basilar airspace disease is noted. The left lung is clear. There is cardiomegaly. IMPRESSION: Moderately large right pleural effusion is markedly increased since the post thoracentesis exam. Electronically Signed   By: Inge Rise M.D.   On: 09/16/2017 13:18   Dg Chest Port 1 View  Result Date: 09/10/2017 CLINICAL DATA:  Shortness of breath.  History of  hepatic malignancy. EXAM: PORTABLE CHEST 1 VIEW COMPARISON:  09/07/2017 and prior radiographs.  Prior CTs. FINDINGS: An enlarging moderate right pleural effusion is noted with increasing moderate right lower lung atelectasis/consolidation. The left lung is clear. The cardiomediastinal silhouette is otherwise unchanged. No pneumothorax or acute bony abnormality. Right shoulder arthroplasty changes again identified. IMPRESSION: 1. Enlarging moderate right pleural effusion and moderate right lower lung atelectasis/consolidation. Electronically Signed   By: Margarette Canada M.D.   On: 09/10/2017 12:58   Dg Abd 2 Views  Result Date: 09/13/2017 CLINICAL DATA:  72 year old with hepatocellular carcinoma, hepatic cirrhosis, presenting with marked abdominal distention and generalized abdominal pain. EXAM: ABDOMEN - 2 VIEW COMPARISON:  09/09/2017 and CT abdomen and pelvis 09/07/2017.  FINDINGS: Bowel gas pattern unremarkable without evidence of obstruction or significant ileus. No evidence of free air or significant air-fluid levels on the erect image. Expected stool burden in the colon. Surgical clips in the right upper quadrant from prior cholecystectomy. Generalized haziness of the abdomen indicates ascites. Bilateral renal calculi identified on the CT are vaguely visible on the current x-ray. Prior L1 vertebral augmentation. Prior L4 through S1 fusion. Prior bilateral hip arthroplasties with anatomic alignment in the AP projection. IMPRESSION: 1. Ascites.  No acute abdominal abnormality otherwise. 2. Bilateral renal calculi as noted on the recent CT are vaguely visible on the x-ray. Electronically Signed   By: Evangeline Dakin M.D.   On: 09/13/2017 15:11   Dg Abd Portable 1v  Result Date: 09/23/2017 CLINICAL DATA:  Abdominal pain.  History of liver cancer. EXAM: PORTABLE ABDOMEN - 1 VIEW COMPARISON:  None. FINDINGS: Nondistended gas-filled loops of small bowel and colon are noted. No dilated bowel loops are present. No  suspicious calcifications are identified. Lumbar spine surgical changes/vertebral augmentation and bilateral hip arthroplasties noted. IMPRESSION: Nonspecific nonobstructive bowel gas pattern. Electronically Signed   By: Margarette Canada M.D.   On: 09/23/2017 16:46   Dg Abd Portable 1v  Result Date: 09/09/2017 CLINICAL DATA:  Abdominal pain EXAM: PORTABLE ABDOMEN - 1 VIEW COMPARISON:  09/07/2017 FINDINGS: Scattered large and small bowel gas is noted. Postsurgical changes in the hips and lumbar spine are noted. Changes of prior vertebral augmentation are again seen. No free air is noted. No obstructive changes are noted. IMPRESSION: No acute abnormality noted. Electronically Signed   By: Inez Catalina M.D.   On: 09/09/2017 07:21   Ir Abdomen US Limited  Result Date: 09/17/2017 CLINICAL DATA:  72 year old with cirrhosis and hepatocellular carcinoma. Evaluate for a therapeutic paracentesis. EXAM: LIMITED ABDOMEN ULTRASOUND FOR ASCITES TECHNIQUE: Limited ultrasound survey for ascites was performed in all four abdominal quadrants. COMPARISON:  09/13/2017 FINDINGS: Again noted is a small amount of ascites.  No large fluid pockets. IMPRESSION: Small amount of ascites.  Therapeutic paracentesis not performed. Electronically Signed   By: Markus Daft M.D.   On: 09/17/2017 14:34   Ir Abdomen US Limited  Result Date: 09/13/2017 CLINICAL DATA:  72 year old female with ascites. EXAM: LIMITED ABDOMEN ULTRASOUND FOR ASCITES TECHNIQUE: Limited ultrasound survey for ascites was performed in all four abdominal quadrants. COMPARISON:  Prior attempted paracentesis 09/08/2017 FINDINGS: Sonographic interrogation of the 4 quadrants of the abdomen demonstrates trace ascites. There is insufficient ascites for paracentesis. IMPRESSION: Trace ascites, insufficient for paracentesis. Electronically Signed   By: Jacqulynn Cadet M.D.   On: 09/13/2017 17:23   Ir Thoracentesis Asp Pleural Space W/img Guide  Result Date:  09/17/2017 INDICATION: Patient with recurrent right pleural effusion. Request is made for diagnostic and therapeutic thoracentesis. EXAM: ULTRASOUND GUIDED DIAGNOSTIC AND THERAPEUTIC RIGHT THORACENTESIS MEDICATIONS: 10 mL 2% lidocaine COMPLICATIONS: None immediate. PROCEDURE: An ultrasound guided thoracentesis was thoroughly discussed with the patient and questions answered. The benefits, risks, alternatives and complications were also discussed. The patient understands and wishes to proceed with the procedure. Written consent was obtained. Ultrasound was performed to localize and mark an adequate pocket of fluid in the right chest. The area was then prepped and draped in the normal sterile fashion. 2% Lidocaine was used for local anesthesia. Under ultrasound guidance a Safe-T-Centesis catheter was introduced. Thoracentesis was performed. The catheter was removed and a dressing applied. FINDINGS: A total of approximately 900 mL of yellow, cloudy fluid was removed. Samples were sent  to the laboratory as requested by the clinical team. IMPRESSION: Successful ultrasound guided diagnostic and therapeutic right thoracentesis yielding 900 mL of pleural fluid. Read by: Brynda Greathouse PA-C Electronically Signed   By: Markus Daft M.D.   On: 09/17/2017 14:00   Ir Thoracentesis Asp Pleural Space W/img Guide  Result Date: 09/12/2017 INDICATION: Patient with dyspnea and history of hepatic malignancy. Right-sided pleural effusion noted on recent imaging. Request is made for diagnostic and therapeutic thoracentesis. EXAM: ULTRASOUND GUIDED DIAGNOSTIC AND THERAPEUTIC THORACENTESIS MEDICATIONS: 2% lidocaine COMPLICATIONS: None immediate. PROCEDURE: An ultrasound guided thoracentesis was thoroughly discussed with the patient and questions answered. The benefits, risks, alternatives and complications were also discussed. The patient understands and wishes to proceed with the procedure. Written consent was obtained. Ultrasound was  performed to localize and mark an adequate pocket of fluid in the right chest. The area was then prepped and draped in the normal sterile fashion. 2% Lidocaine was used for local anesthesia. Under ultrasound guidance a Safe-T-Centesis catheter was introduced. Thoracentesis was performed. The catheter was removed and a dressing applied. FINDINGS: A total of approximately 1.5 L of slightly cloudy serous fluid was removed. Samples were sent to the laboratory as requested by the clinical team. IMPRESSION: Successful ultrasound guided right thoracentesis yielding 1.5 L of pleural fluid. Procedure was terminated slightly early secondary to chest pain and coughing. Read by: Saverio Danker, PA-C Electronically Signed   By: Markus Daft M.D.   On: 09/12/2017 13:32    Microbiology: Recent Results (from the past 240 hour(s))  Respiratory Panel by PCR     Status: Abnormal   Collection Time: 09/18/17  2:08 PM  Result Value Ref Range Status   Adenovirus NOT DETECTED NOT DETECTED Final   Coronavirus 229E NOT DETECTED NOT DETECTED Final   Coronavirus HKU1 NOT DETECTED NOT DETECTED Final   Coronavirus NL63 NOT DETECTED NOT DETECTED Final   Coronavirus OC43 NOT DETECTED NOT DETECTED Final   Metapneumovirus NOT DETECTED NOT DETECTED Final   Rhinovirus / Enterovirus NOT DETECTED NOT DETECTED Final   Influenza A NOT DETECTED NOT DETECTED Final   Influenza B NOT DETECTED NOT DETECTED Final   Parainfluenza Virus 1 NOT DETECTED NOT DETECTED Final   Parainfluenza Virus 2 NOT DETECTED NOT DETECTED Final   Parainfluenza Virus 3 NOT DETECTED NOT DETECTED Final   Parainfluenza Virus 4 NOT DETECTED NOT DETECTED Final   Respiratory Syncytial Virus DETECTED (A) NOT DETECTED Final    Comment: CRITICAL RESULT CALLED TO, READ BACK BY AND VERIFIED WITH: HS Lee RN 17:25 09/18/17 ([email or web address])    Bordetella pertussis NOT DETECTED NOT DETECTED Final   Chlamydophila pneumoniae NOT DETECTED NOT DETECTED Final   Mycoplasma pneumoniae NOT  DETECTED NOT DETECTED Final    Comment: Performed at [facility], [address] [place], [place] [address]  Culture, body fluid-bottle     Status: None   Collection Time: 09/22/17  2:01 PM  Result Value Ref Range Status   Specimen Description PERITONEAL  Final   Special Requests BOTTLES DRAWN AEROBIC AND ANAEROBIC  Final   Culture   Final    NO GROWTH 5 DAYS Performed at [facility], [place] [address], [place], [place] [address]    Report Status 09/27/2017 FINAL  Final  Gram stain     Status: None   Collection Time: 09/22/17  2:01 PM  Result Value Ref Range Status   Specimen Description PERITONEAL  Final   Special Requests NONE  Final  Gram Stain   Final    NO WBC SEEN NO ORGANISMS SEEN Performed at [facility], [place] [address] [place], [place] [address]    Report Status 09/22/2017 FINAL  Final     Labs: Basic Metabolic Panel: Recent Labs  Lab 09/23/17 0542 09/24/17 0218 09/25/17 0304 09/27/17 1456 09/28/17 1025  NA 141 141 140 137 132*  K 4.0 4.3 3.8 3.1* 3.7  CL 96* 96* 94* 86* 86*  CO2 33* 34* 35* 38* 33*  GLUCOSE 98 145* 130* 118* 134*  BUN 26* 25* 26* 25* 30*  CREATININE 1.30* 1.31* 1.36* 1.38* 1.45*  CALCIUM 8.6* 9.0 8.7* 8.9 8.5*  MG  --   --   --  1.7 1.9   Liver Function Tests: Recent Labs  Lab 09/23/17 0542 09/25/17 0304  AST 23 24  ALT 22 21  ALKPHOS 134* 131*  BILITOT 1.2 1.0  PROT 4.6* 4.8*  ALBUMIN 2.4* 2.5*   No results for input(s): LIPASE, AMYLASE in the last 168 hours. Recent Labs  Lab 09/27/17 0317  AMMONIA 28   CBC: Recent Labs  Lab 09/23/17 0542 09/23/17 1518 09/25/17 0304 09/27/17 1456 09/28/17 1025  WBC 5.1 7.4 5.5 4.5 5.3  HGB 8.8* 8.9* 8.5* 8.6* 9.0*  HCT 29.1* 29.5* 28.4* 28.4* 30.0*  MCV 105.8* 104.6* 103.6* 103.3* 104.2*  PLT 77* 88* 78* 69* 45*   Cardiac Enzymes: No results for input(s): CKTOTAL, CKMB, CKMBINDEX, TROPONINI in the last 168 hours. BNP: BNP (last 3 results) Recent Labs     11/30/16 1810 09/08/17 1107 09/16/17 1253  BNP 144.7* 144.3* 157.0*    ProBNP (last 3 results) No results for input(s): PROBNP in the last 8760 hours.  CBG: No results for input(s): GLUCAP in the last 168 hours.     Signed:  Dia Crawford, MD [facility] [phone] pager

## 2017-09-29 DIAGNOSIS — J441 Chronic obstructive pulmonary disease with (acute) exacerbation: Secondary | ICD-10-CM | POA: Diagnosis not present

## 2017-09-29 DIAGNOSIS — J449 Chronic obstructive pulmonary disease, unspecified: Secondary | ICD-10-CM | POA: Diagnosis not present

## 2017-09-30 DIAGNOSIS — K7581 Nonalcoholic steatohepatitis (NASH): Secondary | ICD-10-CM | POA: Diagnosis not present

## 2017-09-30 DIAGNOSIS — N183 Chronic kidney disease, stage 3 (moderate): Secondary | ICD-10-CM | POA: Diagnosis not present

## 2017-09-30 DIAGNOSIS — F329 Major depressive disorder, single episode, unspecified: Secondary | ICD-10-CM | POA: Diagnosis not present

## 2017-09-30 DIAGNOSIS — J441 Chronic obstructive pulmonary disease with (acute) exacerbation: Secondary | ICD-10-CM | POA: Diagnosis not present

## 2017-09-30 DIAGNOSIS — Z9981 Dependence on supplemental oxygen: Secondary | ICD-10-CM | POA: Diagnosis not present

## 2017-09-30 DIAGNOSIS — J440 Chronic obstructive pulmonary disease with acute lower respiratory infection: Secondary | ICD-10-CM | POA: Diagnosis not present

## 2017-09-30 DIAGNOSIS — C220 Liver cell carcinoma: Secondary | ICD-10-CM | POA: Diagnosis not present

## 2017-09-30 DIAGNOSIS — J121 Respiratory syncytial virus pneumonia: Secondary | ICD-10-CM | POA: Diagnosis not present

## 2017-09-30 DIAGNOSIS — J9621 Acute and chronic respiratory failure with hypoxia: Secondary | ICD-10-CM | POA: Diagnosis not present

## 2017-09-30 DIAGNOSIS — G4733 Obstructive sleep apnea (adult) (pediatric): Secondary | ICD-10-CM | POA: Diagnosis not present

## 2017-09-30 DIAGNOSIS — I5032 Chronic diastolic (congestive) heart failure: Secondary | ICD-10-CM | POA: Diagnosis not present

## 2017-09-30 DIAGNOSIS — Z7951 Long term (current) use of inhaled steroids: Secondary | ICD-10-CM | POA: Diagnosis not present

## 2017-09-30 DIAGNOSIS — D539 Nutritional anemia, unspecified: Secondary | ICD-10-CM | POA: Diagnosis not present

## 2017-09-30 DIAGNOSIS — R221 Localized swelling, mass and lump, neck: Secondary | ICD-10-CM | POA: Diagnosis not present

## 2017-09-30 DIAGNOSIS — I8500 Esophageal varices without bleeding: Secondary | ICD-10-CM | POA: Diagnosis not present

## 2017-09-30 DIAGNOSIS — I130 Hypertensive heart and chronic kidney disease with heart failure and stage 1 through stage 4 chronic kidney disease, or unspecified chronic kidney disease: Secondary | ICD-10-CM | POA: Diagnosis not present

## 2017-09-30 DIAGNOSIS — Z7982 Long term (current) use of aspirin: Secondary | ICD-10-CM | POA: Diagnosis not present

## 2017-09-30 DIAGNOSIS — E1122 Type 2 diabetes mellitus with diabetic chronic kidney disease: Secondary | ICD-10-CM | POA: Diagnosis not present

## 2017-09-30 DIAGNOSIS — J90 Pleural effusion, not elsewhere classified: Secondary | ICD-10-CM | POA: Diagnosis not present

## 2017-09-30 DIAGNOSIS — R188 Other ascites: Secondary | ICD-10-CM | POA: Diagnosis not present

## 2017-10-01 ENCOUNTER — Ambulatory Visit
Admission: RE | Admit: 2017-10-01 | Discharge: 2017-10-01 | Disposition: A | Discharge status: Home / Self Care | Payer: PPO | Source: Ambulatory Visit | Attending: Radiology | Admitting: Radiology

## 2017-10-01 ENCOUNTER — Encounter: Payer: Self-pay | Admitting: Radiology

## 2017-10-01 DIAGNOSIS — C220 Liver cell carcinoma: Secondary | ICD-10-CM

## 2017-10-01 HISTORY — PX: IR RADIOLOGIST EVAL & MGMT: IMG5224

## 2017-10-01 NOTE — Progress Notes (Signed)
Chief Complaint: Patient was seen in consultation today for  Chief Complaint  Patient presents with  . Follow-up    Hepatocellular Carcinoma     at the request of Asah Lamay  Referring Physician(s): Caidence Kaseman  History of Present Illness: [name] is a 72 y.o. female with hx of [place]. She underwent image guided MWA of the two large lesions on 1/25 without immediate complication. Unfortunately she has had a bit of a rough three or four weeks post procedure. She developed right sided pleural effusion and required (R)thoracentesis X 2 as well as a paracentesis due to ascites build up as well. She has slowly improved, now maintaining her weight. She is stable on [place] O2 and her most recent CXR looked good, no residual effusion. Her appetite is good, though she does have some occasional nausea.    Past Medical History:  Diagnosis Date  . Asthma   . Cancer [facility])    liver cancer   . COPD (chronic obstructive pulmonary disease) ([place])    x 3 years   . Depression   . Diabetes mellitus without complication ([place])    diet controlled  not on meds   . Edema    left leg   . Esophageal varices ([place])   . Fibromyalgia   . Gastritis   . GERD (gastroesophageal reflux disease)   . Hepatic cirrhosis ([place])   . Hepatic encephalopathy ([place])   . History of blood transfusion   . Hyperlipidemia   . Hypertension   . IBS (irritable bowel syndrome)   . Phlebitis    left leg x 2   . Pneumonia    hx of   . Splenomegaly   . Vitamin D deficiency     Past Surgical History:  Procedure Laterality Date  . ABDOMINAL HYSTERECTOMY    . BACK SURGERY    . CATARACT EXTRACTION, BILATERAL    . CHOLECYSTECTOMY    . ESOPHAGOGASTRODUODENOSCOPY  07/16/2012   Procedure: ESOPHAGOGASTRODUODENOSCOPY (EGD);  Surgeon: Inda Castle, MD;  Location: [facility];  Service: Endoscopy;  Laterality: N/A;  . GASTRIC VARICES BANDING  07/16/2012   Procedure: GASTRIC VARICES BANDING;  Surgeon: Inda Castle, MD;  Location: [facility];  Service: Endoscopy;  Laterality: N/A;  . HIP ARTHROPLASTY Bilateral   . IR GENERIC HISTORICAL  06/25/2016   IR RADIOLOGIST EVAL & MGMT 06/25/2016 [facility]  . IR GENERIC HISTORICAL  06/27/2016   IR KYPHO LUMBAR INC FX REDUCE BONE BX UNI/BIL CANNULATION INC/IMAGING 06/27/2016 Luanne Bras, MD [facility]  . IR GENERIC HISTORICAL  07/17/2016   IR RADIOLOGIST EVAL & MGMT 07/17/2016 [facility]  . IR RADIOLOGIST EVAL & MGMT  07/10/2017  . IR THORACENTESIS ASP PLEURAL SPACE W/IMG GUIDE  09/12/2017  . IR THORACENTESIS ASP PLEURAL SPACE W/IMG GUIDE  09/17/2017  . NECK SURGERY    . RADIOFREQUENCY ABLATION N/A 08/30/2017   Procedure: CT MICROWAVE THERMAL ABLATION;  Surgeon: Arne Cleveland, MD;  Location: [facility];  Service: Anesthesiology;  Laterality: N/A;  . SHOULDER SURGERY Right     Allergies: Atorvastatin; Diphenhydramine hcl (sleep); Hydrocodone-acetaminophen; Lopid [gemfibrozil]; Loratadine; Lorazepam; Simvastatin; Sulfamethoxazole; and Sulfonamide derivatives  Medications: Prior to Admission medications   Medication Sig Start Date End Date Taking? Authorizing Provider  albuterol (PROVENTIL HFA;VENTOLIN HFA) 108 (90 Base) MCG/ACT inhaler Inhale 2 puffs into the lungs every 6 (six) hours as needed for wheezing or shortness of breath. 05/27/16   Ward, Delice Bison, DO  allopurinol (ZYLOPRIM) 300 MG tablet  take 1 tablet (300 mg) by mouth daily with supper - for gout 09/28/17   Allie Bossier, MD  aspirin EC 81 MG tablet Take 81 mg by mouth at bedtime.    [provider]  bisoprolol (ZEBETA) 10 MG tablet Take 1 tablet (10 mg total) by mouth daily. Patient taking differently: Take 10 mg by mouth daily with supper.  04/09/17   Vicie Mutters, PA-C  budesonide (PULMICORT) 0.25 MG/2ML nebulizer solution Take 2 mLs (0.25 mg total) by nebulization 2 (two) times daily. 09/13/17   Desiree Hane, MD  Cholecalciferol (VITAMIN D3) 5000 units CAPS Take  5,000 Units by mouth daily with breakfast.    [provider]  citalopram (CELEXA) 20 MG tablet Take 1 tablet (20 mg total) by mouth daily with supper. 09/28/17   Allie Bossier, MD  cyclobenzaprine (FLEXERIL) 10 MG tablet Take 1 tablet (10 mg total) by mouth 3 (three) times daily as needed for muscle spasms. 03/06/16   Unk Pinto, MD  dextromethorphan-guaiFENesin [facility] DM) 30-600 MG 12hr tablet Take 1 tablet by mouth 2 (two) times daily as needed for cough. 09/13/17   Oretha Milch D, MD  ferrous sulfate 325 (65 FE) MG tablet Take 325 mg by mouth daily.     [provider]  gabapentin (NEURONTIN) 400 MG capsule Take 1 capsule (400 mg total) by mouth 3 (three) times daily. 09/28/17   Allie Bossier, MD  glucose blood test strip Take sugars once daily 01/21/17   Vicie Mutters, PA-C  ipratropium (ATROVENT) 0.02 % nebulizer solution use 1 vial in nebulizer EVERY 4 HOURS AS NEEDED FOR WHEEZING OR SHORTNESS OF BREATH 75/15=5 08/09/17   Unk Pinto, MD  lactulose ([place]) 10 GM/15ML solution take 44ms TWICE DAILY Patient taking differently: Take 10 g by mouth daily.  08/08/17   MUnk Pinto MD  Magnesium 250 MG TABS Take 250 mg by mouth at bedtime.     [provider]  NONFORMULARY OR COMPOUNDED ITEM Apply 1 application topically See admin instructions. SALICYLIC ACID ACID (PETROLATUM) 50% OINTMENT-APPLY TO AFFECTED AREA AT BEDTIME PRN DRY SKIN/LEGS (Compounded at [facility]    [provider]  nystatin cream (MYCOSTATIN) Apply 1 application topically 2 (two) times daily. Patient taking differently: Apply 1 application topically 2 (two) times daily as needed for dry skin (itching).  12/20/16   CVicie Mutters PA-C  ondansetron (ZOFRAN) 4 MG tablet Take 1 tablet (4 mg total) by mouth every 6 (six) hours as needed for nausea or vomiting. 09/02/17   CVicie Mutters PA-C  OXYGEN Inhale 2-3 L into the lungs continuous. 3 L with exertion    [provider]  pantoprazole (PROTONIX) 40 MG tablet Take 1 tablet (40 mg total) by mouth daily. Patient taking differently: Take 40 mg by mouth daily with supper.  04/22/17   MUnk Pinto MD  potassium chloride SA (K-DUR,KLOR-CON) 20 MEQ tablet Take 1 tablet (20 mEq total) by mouth 2 (two) times daily. 09/28/17   WAllie Bossier MD  rifampin (RIFADIN) 300 MG capsule Take 1 capsule 2 x / day for Liver Patient taking differently: Take 300 mg by mouth 2 (two) times daily with a meal. for Liver 07/03/17 01/31/18  MUnk Pinto MD  spironolactone (ALDACTONE) 50 MG tablet Take 3 tablets (150 mg total) by mouth daily. 09/29/17   WAllie Bossier MD  terconazole (TERAZOL 7) 0.4 % vaginal cream Place 1 applicator vaginally at bedtime. Patient taking differently: Place 1 applicator  vaginally at bedtime as needed (itching).  12/20/16   Vicie Mutters, PA-C  torsemide (DEMADEX) 10 MG tablet Take 3 tablets (30 mg total) by mouth every morning. 09/13/17 10/13/17  Oretha Milch D, MD  torsemide (DEMADEX) 20 MG tablet Take 3 tablets (60 mg total) by mouth every evening. 09/14/17 10/14/17  Desiree Hane, MD  traMADol (ULTRAM) 50 MG tablet Take 50 mg by mouth every 12 hours as needed for pain 09/28/17   Allie Bossier, MD  triamcinolone cream (KENALOG) 0.1 % Apply 1 application topically 3 (three) times daily as needed for pain Patient taking differently: Apply 1 application topically 3 (three) times daily as needed (itching).  12/20/16   Vicie Mutters, PA-C     Family History  Problem Relation Age of Onset  . Diabetes Brother        deceased  . Heart disease Sister        A Fib  . Heart disease Mother        CHF  . Atrial fibrillation Sister   . Cancer Father        lung cancer   . Cancer Maternal Uncle        unknown type cancer   . Cancer Other        lung cancer   . Colon cancer Neg Hx     Social History   Socioeconomic History  . Marital status: Married    Spouse name: [name]  . Number  of children: 5  . Years of education: Not on file  . Highest education level: Not on file  Social Needs  . Financial resource strain: Not on file  . Food insecurity - worry: Not on file  . Food insecurity - inability: Not on file  . Transportation needs - medical: Not on file  . Transportation needs - non-medical: Not on file  Occupational History  . Occupation: Arboriculturist: UNEMPLOYED  Tobacco Use  . Smoking status: Former Smoker    Packs/day: 1.00    Years: 50.00    Pack years: 50.00    Types: Cigarettes    Last attempt to quit: 11/05/2014    Years since quitting: 2.9  . Smokeless tobacco: Never Used  . Tobacco comment: Quit April 2016  Substance and Sexual Activity  . Alcohol use: No    Alcohol/week: 0.0 oz  . Drug use: No  . Sexual activity: Not on file  Other Topics Concern  . Not on file  Social History Narrative  . Not on file    Review of Systems: A 12 point ROS discussed and pertinent positives are indicated in the HPI above.  All other systems are negative.  Review of Systems  Vital Signs: BP (!) 132/47   Pulse 76   Temp 98 F (36.7 C) (Oral)   Ht 5' 2.5" (1.588 m)   Wt 178 lb (80.7 kg)   SpO2 95% Comment: O2 at 2L/Nasal Cannula  BMI 32.04 kg/m   Physical Exam  Constitutional: She is oriented to person, place, and time. She appears well-developed. No distress.  Cardiovascular: Normal rate, regular rhythm and normal heart sounds.  Pulmonary/Chest: Effort normal and breath sounds normal. No respiratory distress.  Abdominal: Soft. She exhibits no mass. There is no tenderness. There is no guarding.  Neurological: She is alert and oriented to person, place, and time.  Psychiatric: She has a normal mood and affect.    Imaging: Dg Chest 1 View  Result Date: 09/17/2017  CLINICAL DATA:  Status post left-sided thoracentesis. History of asthma-COPD, former smoker. EXAM: CHEST 1 VIEW COMPARISON:  Portable chest x-ray of September 16, 2017 FINDINGS:  Aeration of the right lung has improved since the thoracentesis. A small amount of pleural fluid persists. There is no postprocedure pneumothorax. The left lung remains well expanded. There is patchy density at the left base. The cardiac silhouette is top-normal in size. The central pulmonary vascularity is mildly prominent. There is calcification in the wall of the aortic arch. IMPRESSION: No postprocedure complication following right-sided thoracentesis. Small right pleural effusion remains. Underlying COPD.  Left basilar atelectasis or infiltrate. Thoracic aortic atherosclerosis. Electronically Signed   By: David  Martinique M.D.   On: 09/17/2017 13:12   Dg Chest 1 View  Result Date: 09/12/2017 CLINICAL DATA:  Status post thoracentesis EXAM: CHEST 1 VIEW COMPARISON:  09/10/2017 radiographs FINDINGS: Reduced size of right pleural effusion. No visible pneumothorax. Re-expansion of significant portions of the right lung. There is some persistent peripheral opacity at the right lung base likely representing residual effusion. The left lung remains clear. Cervical plate and screw fixators. Right proximal humeral prosthesis. Aortoiliac atherosclerotic vascular disease. IMPRESSION: 1. Significant reduction in size of the right pleural effusion and associated re-expansion of portions of the right lung. Small persistent effusion peripherally at the right lung base. No pneumothorax or complicating feature. 2.  Aortic Atherosclerosis (ICD10-I70.0). Electronically Signed   By: Van Clines M.D.   On: 09/12/2017 12:05   Dg Chest 2 View  Result Date: 09/07/2017 CLINICAL DATA:  Chest pain EXAM: CHEST  2 VIEW COMPARISON:  08/30/2017 FINDINGS: Small to moderate layering right pleural effusion. Left lung is clear. No frank interstitial edema. No pneumothorax. The heart is normal in size. Mild degenerative changes of the visualized thoracolumbar spine. L1 vertebral augmentation. Cervical spine fixation hardware. IMPRESSION:  Small to moderate layering right pleural effusion, new. Electronically Signed   By: Julian Hy M.D.   On: 09/07/2017 15:49   Dg Cervical Spine 2 Or 3 Views  Result Date: 09/25/2017 CLINICAL DATA:  72 year old female with new onset midline cervical spine pain radiating to the right side. Soft tissue swelling and erythema. EXAM: CERVICAL SPINE - 2-3 VIEW COMPARISON:  Cervical spine CT 12/07/2016. FINDINGS: C4-C5 and C5-C6 anterior and posterior fusion sequelae is stable. Hardware appears stable and intact. Stable and relatively preserved cervical lordosis. Normal prevertebral soft tissue contour. Normal AP alignment. No acute osseous abnormality identified. No subcutaneous gas. No discrete neck soft tissue abnormality is evident. Negative visible upper chest. Partially visible shoulder arthroplasty on the lateral view. IMPRESSION: Stable since 2018 and negative postoperative radiographic appearance of the cervical spine. Electronically Signed   By: Genevie Ann M.D.   On: 09/25/2017 11:26   Dg Abd 1 View  Result Date: 09/19/2017 CLINICAL DATA:  Cirrhosis.  Ascites.  Abdominal distension. EXAM: ABDOMEN - 1 VIEW COMPARISON:  09/13/2017 FINDINGS: Moderate gaseous distention of the stomach is again demonstrated. Scattered air in the colon and small bowel but no findings for obstruction or perforation. Morbid obesity. The lung bases are grossly clear. Bilateral total hip arthroplasties, lumbar fusion hardware and prior vertebral augmentation changes at L1 IMPRESSION: Moderate gaseous distention of the stomach. No findings for small bowel obstruction or free air. Electronically Signed   By: Marijo Sanes M.D.   On: 09/19/2017 17:46   Ct Abdomen Pelvis W Contrast  Result Date: 09/07/2017 CLINICAL DATA:  Abdominal pain, swelling and shortness of breath. Status post liver cancer surgery.  EXAM: CT ABDOMEN AND PELVIS WITH CONTRAST TECHNIQUE: Multidetector CT imaging of the abdomen and pelvis was performed using the  standard protocol following bolus administration of intravenous contrast. CONTRAST:  76m ISOVUE-300 IOPAMIDOL (ISOVUE-300) INJECTION 61% COMPARISON:  Liver ablation CT dated 08/30/2017. Chest CT dated 07/02/2017. Abdomen and pelvis CT dated 07/11/2016. Abdomen and pelvis CT dated 07/01/2012. FINDINGS: Lower chest: Interval moderate-sized right pleural effusion with right lower lobe atelectasis. Hepatobiliary: Stable nodular liver contours with a small right lobe and enlarged lateral segment left lobe and caudate lobe. Interval post ablation changes in the right lobe and lateral segment left lobe. No visible mass separate from these changes at either location. Cholecystectomy clips. Pancreas: Unremarkable. No pancreatic ductal dilatation or surrounding inflammatory changes. Spleen: Diffusely enlarged, without significant change. Adrenals/Urinary Tract: Stable dilatation of the right renal collecting system without ureteral dilatation. A cortical scar in the mid to upper right kidney containing a 6 mm calcification is unchanged. There is also an additional 5 mm right renal calculus more inferiorly without significant change. A 2.0 cm left renal cyst is unchanged. There is also a 1.4 cm oval low density in the medial aspect of the kidney measuring 25 Hounsfield units in density, without significant change since 07/11/2016. Portions of the urinary bladder obscured by streak artifacts from bilateral hip prostheses with no visible bladder abnormality. No visible ureteral calculi or dilatation. Stomach/Bowel: Mild fat density wall thickening involving the right colon is unchanged. Multiple proximal small bowel loops near the upper limit of normal in size are again demonstrated with normal caliber distal small bowel and normal caliber colon. No evidence of appendicitis. Vascular/Lymphatic: Atheromatous arterial calcifications without aneurysm. No enlarged lymph nodes. Reproductive: Status post hysterectomy. No adnexal  masses. Other: Small to moderate amount of free peritoneal fluid. Diffuse subcutaneous edema. Musculoskeletal: Old, healed right rib fractures. Approximately 30% L1 vertebral compression deformity with kyphoplasty material. Interbody and pedicle screw and rod fixation at the L4 through S1 levels. Lumbar and lower thoracic spine degenerative changes. Bilateral hip prostheses. IMPRESSION: 1. Interval moderate-sized right pleural effusion with right lower lobe atelectasis. 2. Stable changes of cirrhosis of the liver with interval post ablation changes and associated splenomegaly and ascites. 3. Diffuse subcutaneous edema, compatible with hypoproteinemia. 4. Stable chronic right UPJ obstruction and nonobstructing right renal calculus. 5. Stable simple left renal cyst and probable complicated left renal cyst. Electronically Signed   By: SClaudie ReveringM.D.   On: 09/07/2017 17:17   [place]domen Limited  Result Date: 09/08/2017 CLINICAL DATA:  History of cirrhosis. Please perform abdominal ultrasound and ultrasound-guided paracentesis as indicated. EXAM: LIMITED ABDOMEN ULTRASOUND FOR ASCITES TECHNIQUE: Limited ultrasound survey for ascites was performed in all four abdominal quadrants. COMPARISON:  CT abdomen pelvis-09/07/2017; CT-guided hepatic lesion microwave ablation - 08/30/2017 FINDINGS: Provided images of the abdomen demonstrates only a trace amount of fluid within the abdomen, too small to allow for safe ultrasound-guided paracentesis. IMPRESSION: Trace amount of intra-abdominal ascites, too small to allow for safe ultrasound-guided paracentesis. Electronically Signed   By: JSandi MariscalM.D.   On: 09/08/2017 09:38   [place]orea[place]acentesis  Result Date: 09/22/2017 INDICATION: History of cirrhosis and newly diagnosed with hepatocellular carcinoma with abdominal ascites. Request is made for diagnostic and therapeutic paracentesis. EXAM: ULTRASOUND GUIDED DIAGNOSTIC AND THERAPEUTIC PARACENTESIS MEDICATIONS: 1% Xylocaine  COMPLICATIONS: None immediate. PROCEDURE: Informed written consent was obtained from the patient after a discussion of the risks, benefits and alternatives to treatment. A timeout was performed prior to the initiation of  the procedure. Initial ultrasound scanning demonstrates a small amount of ascites within the left lower abdominal quadrant. The left lower abdomen was prepped and draped in the usual sterile fashion. 1% Xylocaine was used for local anesthesia. Following this, a 19 gauge, 7-cm, Yueh catheter was introduced. An ultrasound image was saved for documentation purposes. The paracentesis was performed. The catheter was removed and a dressing was applied. The patient tolerated the procedure well without immediate post procedural complication. FINDINGS: A total of approximately 2.6 L of slightly yellow chylous fluid was removed. Samples were sent to the laboratory as requested by the clinical team. IMPRESSION: Successful ultrasound-guided paracentesis yielding 2.6 liters of peritoneal fluid. Read by: Saverio Danker, PA-C Electronically Signed   By: Jerilynn Mages.  Shick M.D.   On: 09/22/2017 14:26   Dg Chest Port 1 View  Result Date: 09/27/2017 CLINICAL DATA:  72 year old female with recurrent respiratory failure. Right lower lobe pneumonia, positive RSV. EXAM: PORTABLE CHEST 1 VIEW COMPARISON:  09/23/2017 and earlier. FINDINGS: Portable AP semi upright view at 0350 hrs. Stable lung volumes. Mediastinal contours remain normal. Visualized tracheal air column is within normal limits. No pneumothorax. Largely resolved confluent right lung base opacity since 09/18/2017. Mild patchy bibasilar opacity today. No new confluent opacity. Mildly increased pulmonary vascularity but no overt edema. No pleural effusion is evident. Paucity of bowel gas in the upper abdomen. Prior cervical spine fusion and right shoulder arthroplasty. IMPRESSION: 1. Largely resolved right lung pneumonia since 09/18/2017. 2. Mild bibasilar opacity now  most resembles atelectasis. 3. Increased pulmonary vascularity since 09/23/2017 but no overt edema. Electronically Signed   By: Genevie Ann M.D.   On: 09/27/2017 07:01   Dg Chest Port 1 View  Result Date: 09/23/2017 CLINICAL DATA:  Pleural effusion.  Shortness of breath and cough EXAM: PORTABLE CHEST 1 VIEW COMPARISON:  Two days ago FINDINGS: Airspace type opacity at the medial right base. Reticulonodular density at the left base. Normal heart size and mediastinal contours. No edema, effusion, or pneumothorax. IMPRESSION: 1. Pneumonia or atelectasis at the bases. 2. No visible pleural effusion. 3. No significant change from 2 days prior. Electronically Signed   By: Monte Fantasia M.D.   On: 09/23/2017 07:52   Dg Chest Port 1 View  Result Date: 09/21/2017 CLINICAL DATA:  Pleural effusion EXAM: PORTABLE CHEST 1 VIEW COMPARISON:  09/18/2017 FINDINGS: Mild improvement in right lower lobe infiltrate. No effusion. Minimal left lower lobe atelectasis/infiltrate unchanged. Negative for heart failure or edema. IMPRESSION: Improvement in right lower lobe airspace disease. Electronically Signed   By: Franchot Gallo M.D.   On: 09/21/2017 08:31   Dg Chest Port 1 View  Result Date: 09/18/2017 CLINICAL DATA:  Pleural effusion follow-up EXAM: PORTABLE CHEST 1 VIEW COMPARISON:  09/17/2017, 09/16/2017 FINDINGS: Right lower lobe infiltrate has progressed. Improvement in right effusion. Minimal left lower lobe airspace disease unchanged. Negative for heart failure. No effusion on the left. IMPRESSION: Progression of right lower lobe infiltrate, probable pneumonia. Improvement in right pleural effusion. Electronically Signed   By: Franchot Gallo M.D.   On: 09/18/2017 08:11   Dg Chest Port 1 View  Result Date: 09/16/2017 CLINICAL DATA:  Patient status post thoracentesis 09/12/2017 for a right pleural effusion. Severe shortness of breath today. EXAM: PORTABLE CHEST 1 VIEW COMPARISON:  Single-view of the chest 09/12/2017.  FINDINGS: The patient has a moderately large right pleural effusion which has markedly increased in size since the comparison examination. Associated basilar airspace disease is noted. The left lung is clear.  There is cardiomegaly. IMPRESSION: Moderately large right pleural effusion is markedly increased since the post thoracentesis exam. Electronically Signed   By: Inge Rise M.D.   On: 09/16/2017 13:18   Dg Chest Port 1 View  Result Date: 09/10/2017 CLINICAL DATA:  Shortness of breath.  History of hepatic malignancy. EXAM: PORTABLE CHEST 1 VIEW COMPARISON:  09/07/2017 and prior radiographs.  Prior CTs. FINDINGS: An enlarging moderate right pleural effusion is noted with increasing moderate right lower lung atelectasis/consolidation. The left lung is clear. The cardiomediastinal silhouette is otherwise unchanged. No pneumothorax or acute bony abnormality. Right shoulder arthroplasty changes again identified. IMPRESSION: 1. Enlarging moderate right pleural effusion and moderate right lower lung atelectasis/consolidation. Electronically Signed   By: Margarette Canada M.D.   On: 09/10/2017 12:58   Dg Abd 2 Views  Result Date: 09/13/2017 CLINICAL DATA:  72 year old with hepatocellular carcinoma, hepatic cirrhosis, presenting with marked abdominal distention and generalized abdominal pain. EXAM: ABDOMEN - 2 VIEW COMPARISON:  09/09/2017 and CT abdomen and pelvis 09/07/2017. FINDINGS: Bowel gas pattern unremarkable without evidence of obstruction or significant ileus. No evidence of free air or significant air-fluid levels on the erect image. Expected stool burden in the colon. Surgical clips in the right upper quadrant from prior cholecystectomy. Generalized haziness of the abdomen indicates ascites. Bilateral renal calculi identified on the CT are vaguely visible on the current x-ray. Prior L1 vertebral augmentation. Prior L4 through S1 fusion. Prior bilateral hip arthroplasties with anatomic alignment in the AP  projection. IMPRESSION: 1. Ascites.  No acute abdominal abnormality otherwise. 2. Bilateral renal calculi as noted on the recent CT are vaguely visible on the x-ray. Electronically Signed   By: Evangeline Dakin M.D.   On: 09/13/2017 15:11   Dg Abd Portable 1v  Result Date: 09/23/2017 CLINICAL DATA:  Abdominal pain.  History of liver cancer. EXAM: PORTABLE ABDOMEN - 1 VIEW COMPARISON:  None. FINDINGS: Nondistended gas-filled loops of small bowel and colon are noted. No dilated bowel loops are present. No suspicious calcifications are identified. Lumbar spine surgical changes/vertebral augmentation and bilateral hip arthroplasties noted. IMPRESSION: Nonspecific nonobstructive bowel gas pattern. Electronically Signed   By: Margarette Canada M.D.   On: 09/23/2017 16:46   Dg Abd Portable 1v  Result Date: 09/09/2017 CLINICAL DATA:  Abdominal pain EXAM: PORTABLE ABDOMEN - 1 VIEW COMPARISON:  09/07/2017 FINDINGS: Scattered large and small bowel gas is noted. Postsurgical changes in the hips and lumbar spine are noted. Changes of prior vertebral augmentation are again seen. No free air is noted. No obstructive changes are noted. IMPRESSION: No acute abnormality noted. Electronically Signed   By: Inez Catalina M.D.   On: 09/09/2017 07:21   Ir Abdomen US Limited  Result Date: 09/17/2017 CLINICAL DATA:  72 year old with cirrhosis and hepatocellular carcinoma. Evaluate for a therapeutic paracentesis. EXAM: LIMITED ABDOMEN ULTRASOUND FOR ASCITES TECHNIQUE: Limited ultrasound survey for ascites was performed in all four abdominal quadrants. COMPARISON:  09/13/2017 FINDINGS: Again noted is a small amount of ascites.  No large fluid pockets. IMPRESSION: Small amount of ascites.  Therapeutic paracentesis not performed. Electronically Signed   By: Markus Daft M.D.   On: 09/17/2017 14:34   Ir Abdomen US Limited  Result Date: 09/13/2017 CLINICAL DATA:  72 year old female with ascites. EXAM: LIMITED ABDOMEN ULTRASOUND FOR ASCITES  TECHNIQUE: Limited ultrasound survey for ascites was performed in all four abdominal quadrants. COMPARISON:  Prior attempted paracentesis 09/08/2017 FINDINGS: Sonographic interrogation of the 4 quadrants of the abdomen demonstrates trace ascites. There is  insufficient ascites for paracentesis. IMPRESSION: Trace ascites, insufficient for paracentesis. Electronically Signed   By: Jacqulynn Cadet M.D.   On: 09/13/2017 17:23   Ir Thoracentesis Asp Pleural Space W/img Guide  Result Date: 09/17/2017 INDICATION: Patient with recurrent right pleural effusion. Request is made for diagnostic and therapeutic thoracentesis. EXAM: ULTRASOUND GUIDED DIAGNOSTIC AND THERAPEUTIC RIGHT THORACENTESIS MEDICATIONS: 10 mL 2% lidocaine COMPLICATIONS: None immediate. PROCEDURE: An ultrasound guided thoracentesis was thoroughly discussed with the patient and questions answered. The benefits, risks, alternatives and complications were also discussed. The patient understands and wishes to proceed with the procedure. Written consent was obtained. Ultrasound was performed to localize and mark an adequate pocket of fluid in the right chest. The area was then prepped and draped in the normal sterile fashion. 2% Lidocaine was used for local anesthesia. Under ultrasound guidance a Safe-T-Centesis catheter was introduced. Thoracentesis was performed. The catheter was removed and a dressing applied. FINDINGS: A total of approximately 900 mL of yellow, cloudy fluid was removed. Samples were sent to the laboratory as requested by the clinical team. IMPRESSION: Successful ultrasound guided diagnostic and therapeutic right thoracentesis yielding 900 mL of pleural fluid. Read by: Brynda Greathouse PA-C Electronically Signed   By: Markus Daft M.D.   On: 09/17/2017 14:00   Ir Thoracentesis Asp Pleural Space W/img Guide  Result Date: 09/12/2017 INDICATION: Patient with dyspnea and history of hepatic malignancy. Right-sided pleural effusion noted on  recent imaging. Request is made for diagnostic and therapeutic thoracentesis. EXAM: ULTRASOUND GUIDED DIAGNOSTIC AND THERAPEUTIC THORACENTESIS MEDICATIONS: 2% lidocaine COMPLICATIONS: None immediate. PROCEDURE: An ultrasound guided thoracentesis was thoroughly discussed with the patient and questions answered. The benefits, risks, alternatives and complications were also discussed. The patient understands and wishes to proceed with the procedure. Written consent was obtained. Ultrasound was performed to localize and mark an adequate pocket of fluid in the right chest. The area was then prepped and draped in the normal sterile fashion. 2% Lidocaine was used for local anesthesia. Under ultrasound guidance a Safe-T-Centesis catheter was introduced. Thoracentesis was performed. The catheter was removed and a dressing applied. FINDINGS: A total of approximately 1.5 L of slightly cloudy serous fluid was removed. Samples were sent to the laboratory as requested by the clinical team. IMPRESSION: Successful ultrasound guided right thoracentesis yielding 1.5 L of pleural fluid. Procedure was terminated slightly early secondary to chest pain and coughing. Read by: Saverio Danker, PA-C Electronically Signed   By: Markus Daft M.D.   On: 09/12/2017 13:32    Labs:  CBC: Recent Labs    09/23/17 1518 09/25/17 0304 09/27/17 1456 09/28/17 1025  WBC 7.4 5.5 4.5 5.3  HGB 8.9* 8.5* 8.6* 9.0*  HCT 29.5* 28.4* 28.4* 30.0*  PLT 88* 78* 69* 45*    COAGS: Recent Labs    07/18/17 1135 08/27/17 1457 09/07/17 1428 09/08/17 0936 09/17/17 0708 09/23/17 0542  INR 1.10 1.10 1.17 1.29 1.23 1.24  APTT 32 34  --   --   --   --     BMP: Recent Labs    09/24/17 0218 09/25/17 0304 09/27/17 1456 09/28/17 1025  NA 141 140 137 132*  K 4.3 3.8 3.1* 3.7  CL 96* 94* 86* 86*  CO2 34* 35* 38* 33*  GLUCOSE 145* 130* 118* 134*  BUN 25* 26* 25* 30*  CALCIUM 9.0 8.7* 8.9 8.5*  CREATININE 1.31* 1.36* 1.38* 1.45*  GFRNONAA  40* 38* 38* 36*  GFRAA 47* 45* 44* 41*    LIVER FUNCTION TESTS:  Recent Labs    09/19/17 1420 09/21/17 0502 09/23/17 0542 09/25/17 0304  BILITOT 1.5* 1.3* 1.2 1.0  AST 35 27 23 24   ALT 26 26 22 21   ALKPHOS 114 152* 134* 131*  PROT 5.1* 5.0* 4.6* 4.8*  ALBUMIN 2.8* 2.5* 2.4* 2.5*    TUMOR MARKERS: Recent Labs    12/11/16 1602 05/31/17 1257  AFPTM 5.1 5.5    Assessment and Plan: [place] with 2 lesions S/p perc MWA on 1/25. Gradually improving from post procedure issues. Has follow up with Oncology next week. We will see her back with follow up imaging in about 2 more months.  Thank you for this interesting consult.  I greatly enjoyed meeting [name] and look forward to participating in their care.  A copy of this report was sent to the requesting provider on this date.  Electronically Signed: Ascencion Dike 10/01/2017, 2:51 PM   I spent a total of 25 minutes in face to face in clinical consultation, greater than 50% of which was counseling/coordinating care for [facility] post ablation

## 2017-10-02 NOTE — Progress Notes (Signed)
Hospital follow up  Assessment and Plan: Hospital visit follow up for:  Hospital discharge meds were reviewed, and reconciled with the patient.   Diagnoses and all orders for this visit:  Acute on chronic respiratory failure with hypoxemia (HCC) STAT BMP for CO2, discussed titrating O2 to maintain 89-93%, discussed CO2 narcosis Pending labs start Diamox as indicated Previously established with Dr.Wort, urgent respiratory referral back  OSA (obstructive sleep apnea) Reports tolerated BiPAP - continue with O2, ? Need for NIV  - urgent referral to respiratory for management  Secondary esophageal varices without bleeding ([place]) Follow up as scheduled with Dr. Enis Gash 3/1  Pneumonia due to respiratory syncytial virus (RSV) Mainly resolved on discharge, lungs clear though somewhat diminished today - Will refer to pulmonology for ongoing management   Cirrhosis of liver with ascites, unspecified hepatic cirrhosis type ([place]) Follow up as scheduled with Dr. Enis Gash 3/1  Hepatic encephalopathy ([place]) Asterixis negative today; A&Ox3 Continue rifampin 300 mg BID, lactulose 10 g daily - Check ammonia today  CKD (chronic kidney disease), stage III ([place]) - STAT BMP/GFR  Anemia/Thrombocytopenia ?secondary hemolysis r/t cirrhotic splenomegaly Check CBC  Cramping of leg Checking STAT BMP/GFR Check magnesium Already prescribed muscle relaxer-   Anxiety Xanax 0.25 mg BID-TID PRN anxiety, cramping, air hunger -   Goals of care Patient and husband remain optimistic about pursuing full treatment at this time despite discussion of poor candidacy for many treatment options secondary to multiple advanced/limiting health concerns. Open to hospice/palliative care "eventually" but extremely poorly receptive at this time.   There are no discontinued medications.  Over 40 minutes of exam, counseling, chart review, and complex, high/moderate level critical decision making was performed this  visit.   Future Appointments  Date Time Provider [place]  10/04/2069  2:30 PM Leotis Pain [facility]  10/08/2069  3:30 PM Vicie Mutters, PA-C [facility] None  10/09/2069 10:45 AM [facility] LAB 5 [facility] None  10/09/2069 11:15 AM Truitt Merle, MD [facility] None  11/11/2069  3:30 PM Liane Comber, NP [facility] None  06/09/2070 10:00 AM Vicie Mutters, PA-C [facility] None     HPI 72 y.o.female presents accompanied her her husband for follow up for transition from recent hospitalization. Admit date to the hospital was 09/16/17, patient was discharged from the hospital on 09/28/17 and our clinical staff contacted the office the day after discharge to set up a follow up appointment. The discharge summary, medications, and diagnostic test results were reviewed before meeting with the patient. The patient was admitted for:   Acute on Chronic Respiratory Failure with Hypoxia: Multifactorial COPD exacerbation, positiveRSV pneumonia, HCAP, recurrent RIGHT pleural effusion, ascites.  Discharged on O2 [place] titrated to maintain SPO2 89-93% though with concerns of hypercapnia with CO2 as high as 38 on 2/22, last available on 2/23 was 33. CPAP/BiPAP QHS - recommended, patient was apparently unable to tolerate r/t productive coughing, required "trilogy machine" -  Further noted patient has not well established with pulmonology; last eval by Dr. Alvina Filbert NP Eric Form on 04/12/2016, no consult or arrangements made with discharge.  -She presents today with O2 on 2L, O2 sat 89% without remarkable dyspnea, reports has done well with 2L at home.  Continues to have some coughing.   OSA/OHS -Patient apparently required Trilogy prior to discharge, unable to tolerate BiPAP secondary to productive cough, per discharge physician/RT notes. Dr. Sherral Hammers remarks she requires NIV both QHS and daytime, and failure to have NIV available for use over 24 hour period could lead  to death due to her hypercapnia,  however unclear from discharge note whether arrangements were made.  Discharged on [place] O2 titrated to maintain O2 89-93% - patient reports she has BiPAP at home and tolerating this well  RLL pneumonia (HCAP)//positiveRSV pneumonia -2/13 PCXR consistent with RLL pneumonia, without leukocytosis ? Secondary to immunocompromise r/t HCC/liver failure. Treated by antibiotics per pneumonia protocol (exact agent unclear), budesonide BID, Duoneb TID - CXR 2/22 demonstrates largely resolved right lung pneumonia, mild bibasilar opacity most resembling atelectasis. 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 R thoracentesis while hospitalized with 900 ml aspirated, path negative for malignant cells, CXR 2/22 demonstrates largely resolved right lung pneumonia, mild bibasilar opacity most resembling atelectasis. - Lungs clear to auscultation today  Nonalcoholic Liver Cirrhosis /Nash/Hepatocellular carcinoma/ascites Ammonia levels appear improved while hospitalized on rifampin 300 mg BID, lactulose 10 g daily. S/p paracentesis 2.6 L for ascites. Patient has follow-up with [facility]@1430 [facility]/Paracentesis will most likely requireq2-3 week outpatient appointments. Recurrent Pleural effusion, ascites secondary to Nash,esophageal varices, hepatic encephalopathy (Child Pugh score 6/class A), hepatocellular carcinoma(per oncology Dr. Truitt Merle pt to follow up 6-12 months, s/p percutaneous radiofrequency ablation of segment 6 hepatic lesion, poor surgical candidate).  - Followed up with Oncology yesterday, apparently remain optimistic   Chronic diastolic CHF  Strict in and out since admission-3.3 L, d/c'd on torsemide,  182# at discharge on 2/23 with instructions to hold zaroxolyn pending repeat  BMET - D/c Cr on 2/23 1.45, baseline Cr 1.3 with stage III CKD.   She has a history of Diastolic, denies paroxysmal nocturnal dyspnea and edema. Positive for dyspnea, fatigue, orthopnea and tachypnea. Reports home weights have been stable. Has been holding zaroxolyn per discharge instructions pending repeat BMP today.  Wt Readings from Last 3 Encounters:  10/03/17 176 lb (79.8 kg)  10/01/17 178 lb (80.7 kg)  09/28/17 182 lb 8.7 oz (82.8 kg)   Macrocytic anemia B-12, folate normal/elevated. ?Secondary hemolysis r/t cirrhosis>splenomegaly CBC Latest Ref Rng & Units 09/28/2017 09/27/2017 09/25/2017  WBC 4.0 - 10.5 K/uL 5.3 4.5 5.5  Hemoglobin 12.0 - 15.0 g/dL 9.0(L) 8.6(L) 8.5(L)  Hematocrit 36.0 - 46.0 % 30.0(L) 28.4(L) 28.4(L)  Platelets 150 - 400 K/uL 45(L) 69(L) 78(L)   Goals of care Palliative care was consulted while hospitalized to discuss short-term vs long-term goals of care vs hospice - patient was agreeable at one point but then decilned, wanting to f/u with outpatient oncology and hopeful for further treatment options despite discussion regarding poor candidate for liver transplant/oncology options secondary to multiple co morbidities and recurrent hospitalizations. Palliative services will continue to follow outpatient in hopes of transition to hospice services when patient/husband ready.  - presents today very intent and optimistic after meeting with oncologist; is agreeable to discussing hospice "when we get to that point." "I don't think we're there yet." - per the husband.   Images while in the hospital: CXR 09/27/2017  1. Largely resolved right lung pneumonia since 09/18/2017. 2. Mild bibasilar opacity now most resembles atelectasis. 3. Increased pulmonary vascularity since 09/23/2017 but no overt Edema.  She presents today with jerky/spastic movements of particularly LLE; reports ongoing since the day following discharge with pain and anxiety has been unable to rest. She has  palpable spasm of L quad; tender. She reports she has been anxious and unable to sleep due this the pain,  spasms and anxiety. She also reports ongoing nausea and poor appetite. Endorses generalized pruritis.    Past Medical History:  Diagnosis Date  . Asthma   . Cancer [facility])    liver cancer   . COPD (chronic obstructive pulmonary disease) ([place])    x 3 years   . Depression   . Diabetes mellitus without complication ([place])    diet controlled  not on meds   . Edema    left leg   . Esophageal varices ([place])   . Fibromyalgia   . Gastritis   . GERD (gastroesophageal reflux disease)   . Hepatic cirrhosis ([place])   . Hepatic encephalopathy ([place])   . History of blood transfusion   . Hyperlipidemia   . Hypertension   . IBS (irritable bowel syndrome)   . Phlebitis    left leg x 2   . Pneumonia    hx of   . Splenomegaly   . Vitamin D deficiency      Allergies  Allergen Reactions  . Atorvastatin Other (See Comments)    Unknown reaction  . Diphenhydramine Hcl (Sleep) Hives  . Hydrocodone-Acetaminophen Other (See Comments)    Unknown reaction  . Lopid [Gemfibrozil] Other (See Comments)    Unknown reaction  . Loratadine Hives  . Lorazepam Hives  . Simvastatin Other (See Comments)    Unknown reaction  . Sulfamethoxazole Hives  . Sulfonamide Derivatives Hives      Current Outpatient Medications on File Prior to Visit  Medication Sig Dispense Refill  . albuterol (PROVENTIL HFA;VENTOLIN HFA) 108 (90 Base) MCG/ACT inhaler Inhale 2 puffs into the lungs every 6 (six) hours as needed for wheezing or shortness of breath. 1 Inhaler 2  . allopurinol (ZYLOPRIM) 300 MG tablet take 1 tablet (300 mg) by mouth daily with supper - for gout 30 tablet 0  . aspirin EC 81 MG tablet Take 81 mg by mouth at bedtime.    . bisoprolol (ZEBETA) 10 MG tablet Take 1 tablet (10 mg total) by mouth daily. (Patient taking differently: Take 10 mg by mouth daily with supper. ) 90 tablet 0  . budesonide (PULMICORT)  0.25 MG/2ML nebulizer solution Take 2 mLs (0.25 mg total) by nebulization 2 (two) times daily. 60 mL 12  . Cholecalciferol (VITAMIN D3) 5000 units CAPS Take 5,000 Units by mouth daily with breakfast.    . citalopram (CELEXA) 20 MG tablet Take 1 tablet (20 mg total) by mouth daily with supper. 30 tablet 0  . cyclobenzaprine (FLEXERIL) 10 MG tablet Take 1 tablet (10 mg total) by mouth 3 (three) times daily as needed for muscle spasms. 90 tablet 2  . dextromethorphan-guaiFENesin (MUCINEX DM) 30-600 MG 12hr tablet Take 1 tablet by mouth 2 (two) times daily as needed for cough. 30 tablet 0  . ferrous sulfate 325 (65 FE) MG tablet Take 325 mg by mouth daily.     Marland Kitchen gabapentin (NEURONTIN) 400 MG capsule Take 1 capsule (400 mg total) by mouth 3 (three) times daily. 90 capsule 0  . glucose blood test strip Take sugars once daily 50 each 12  . ipratropium (ATROVENT) 0.02 % nebulizer solution use 1 vial in nebulizer EVERY 4 HOURS AS NEEDED FOR WHEEZING OR SHORTNESS OF BREATH 75/15=5 75 mL 3  . lactulose (CHRONULAC) 10 GM/15ML solution take 12ms TWICE DAILY (Patient taking differently: Take 10 g by mouth daily. ) 2700 mL 2  . Magnesium 250 MG TABS Take 250 mg by mouth at bedtime.     .Marland Kitchen  NONFORMULARY OR COMPOUNDED ITEM Apply 1 application topically See admin instructions. SALICYLIC ACID ACID (PETROLATUM) 50% OINTMENT-APPLY TO AFFECTED AREA AT BEDTIME PRN DRY SKIN/LEGS (Compounded at [place])    . nystatin cream (MYCOSTATIN) Apply 1 application topically 2 (two) times daily. (Patient taking differently: Apply 1 application topically 2 (two) times daily as needed for dry skin (itching). ) 30 g 1  . ondansetron (ZOFRAN) 4 MG tablet Take 1 tablet (4 mg total) by mouth every 6 (six) hours as needed for nausea or vomiting. 30 tablet 1  . OXYGEN Inhale 2-3 L into the lungs continuous. 3 L with exertion    . pantoprazole (PROTONIX) 40 MG tablet Take 1 tablet (40 mg total) by mouth daily. (Patient taking  differently: Take 40 mg by mouth daily with supper. ) 90 tablet 3  . potassium chloride SA (K-DUR,KLOR-CON) 20 MEQ tablet Take 1 tablet (20 mEq total) by mouth 2 (two) times daily. 60 tablet 0  . rifampin (RIFADIN) 300 MG capsule Take 1 capsule 2 x / day for Liver (Patient taking differently: Take 300 mg by mouth 2 (two) times daily with a meal. for Liver) 180 capsule 1  . spironolactone (ALDACTONE) 50 MG tablet Take 3 tablets (150 mg total) by mouth daily. 90 tablet 0  . terconazole (TERAZOL 7) 0.4 % vaginal cream Place 1 applicator vaginally at bedtime. (Patient taking differently: Place 1 applicator vaginally at bedtime as needed (itching). ) 45 g 2  . torsemide (DEMADEX) 10 MG tablet Take 3 tablets (30 mg total) by mouth every morning. 90 tablet 0  . torsemide (DEMADEX) 20 MG tablet Take 3 tablets (60 mg total) by mouth every evening. 90 tablet 0  . traMADol (ULTRAM) 50 MG tablet Take 50 mg by mouth every 12 hours as needed for pain 10 tablet 0  . triamcinolone cream (KENALOG) 0.1 % Apply 1 application topically 3 (three) times daily as needed for pain (Patient taking differently: Apply 1 application topically 3 (three) times daily as needed (itching). ) 80 g 0   No current facility-administered medications on file prior to visit.     ROS: all negative except above.   Physical Exam: Filed Weights   10/03/17 1554  Weight: 176 lb (79.8 kg)   BP (!) 150/60   Pulse 95   Temp 97.9 F (36.6 C)   Ht 5' 2"  (1.575 m)   Wt 176 lb (79.8 kg)   SpO2 (!) 89% Comment: with O2  BMI 32.19 kg/m  General Appearance: fairly groomed elderly female in wheelchair, appears unwell.  Eyes: PERRLA, EOMs, conjunctiva no swelling or erythema, ?mild jaundice ENT/Mouth: Ext aud canals clear, TMs without erythema, bulging. No erythema, swelling, or exudate on post pharynx.  Tonsils not swollen or erythematous. Hearing normal.  Neck: Supple, thyroid normal.  Respiratory: Respiratory effort increased, mildly  tachypneic, without retractions/accessory muscle use, BS equal bilaterally without rales, rhonchi, wheezing or stridor, somewhat diminished throughout.  Cardio: RRR with no MRGs. Symmetrical peripheral pulses without notable edema.  Abdomen: Distended, somewhat firm/no fluid wave, + BS.  Non tender, no guarding, rebound, palpable masses.  Lymphatics: Non tender without lymphadenopathy.  Musculoskeletal: Confined to wheelchair, palpable spasms of left quads/tender Skin: Warm, dry without notable rashes, lesions, ecchymosis.  Neuro: limited exam as patient unable to tolerate/spams of lower extremities; negative liver flap Psych: Awake and oriented X 3, anxious affect, somewhat withdrawn    Izora Ribas, NP 5:43 PM [facility]

## 2017-10-03 ENCOUNTER — Ambulatory Visit (INDEPENDENT_AMBULATORY_CARE_PROVIDER_SITE_OTHER): Payer: PPO | Admitting: Adult Health

## 2017-10-03 ENCOUNTER — Encounter: Payer: Self-pay | Admitting: Adult Health

## 2017-10-03 VITALS — BP 150/60 | HR 95 | Temp 97.9°F | Ht 62.0 in | Wt 176.0 lb

## 2017-10-03 DIAGNOSIS — K7290 Hepatic failure, unspecified without coma: Secondary | ICD-10-CM | POA: Diagnosis not present

## 2017-10-03 DIAGNOSIS — N183 Chronic kidney disease, stage 3 unspecified: Secondary | ICD-10-CM

## 2017-10-03 DIAGNOSIS — D649 Anemia, unspecified: Secondary | ICD-10-CM

## 2017-10-03 DIAGNOSIS — J9621 Acute and chronic respiratory failure with hypoxia: Secondary | ICD-10-CM | POA: Diagnosis not present

## 2017-10-03 DIAGNOSIS — R188 Other ascites: Secondary | ICD-10-CM

## 2017-10-03 DIAGNOSIS — J121 Respiratory syncytial virus pneumonia: Secondary | ICD-10-CM | POA: Diagnosis not present

## 2017-10-03 DIAGNOSIS — I8510 Secondary esophageal varices without bleeding: Secondary | ICD-10-CM | POA: Diagnosis not present

## 2017-10-03 DIAGNOSIS — E876 Hypokalemia: Secondary | ICD-10-CM

## 2017-10-03 DIAGNOSIS — D696 Thrombocytopenia, unspecified: Secondary | ICD-10-CM

## 2017-10-03 DIAGNOSIS — R252 Cramp and spasm: Secondary | ICD-10-CM | POA: Diagnosis not present

## 2017-10-03 DIAGNOSIS — K7682 Hepatic encephalopathy: Secondary | ICD-10-CM

## 2017-10-03 DIAGNOSIS — K7460 Unspecified cirrhosis of liver: Secondary | ICD-10-CM

## 2017-10-03 DIAGNOSIS — G4733 Obstructive sleep apnea (adult) (pediatric): Secondary | ICD-10-CM

## 2017-10-03 LAB — CBC WITH DIFFERENTIAL/PLATELET
Basophils Absolute: 19 cells/uL (ref 0–200)
Basophils Relative: 0.5 %
EOS ABS: 99 {cells}/uL (ref 15–500)
Eosinophils Relative: 2.6 %
HCT: 26.1 % — ABNORMAL LOW (ref 35.0–45.0)
Hemoglobin: 8.5 g/dL — ABNORMAL LOW (ref 11.7–15.5)
Lymphs Abs: 707 cells/uL — ABNORMAL LOW (ref 850–3900)
MCH: 31.5 pg (ref 27.0–33.0)
MCHC: 32.6 g/dL (ref 32.0–36.0)
MCV: 96.7 fL (ref 80.0–100.0)
MPV: 12.1 fL (ref 7.5–12.5)
Monocytes Relative: 5.8 %
NEUTROS PCT: 72.5 %
Neutro Abs: 2755 cells/uL (ref 1500–7800)
Platelets: 89 10*3/uL — ABNORMAL LOW (ref 140–400)
RBC: 2.7 10*6/uL — ABNORMAL LOW (ref 3.80–5.10)
RDW: 15.4 % — AB (ref 11.0–15.0)
TOTAL LYMPHOCYTE: 18.6 %
WBC: 3.8 10*3/uL (ref 3.8–10.8)
WBCMIX: 220 {cells}/uL (ref 200–950)

## 2017-10-03 LAB — HEPATIC FUNCTION PANEL
AG Ratio: 1.3 (calc) (ref 1.0–2.5)
ALT: 19 U/L (ref 6–29)
AST: 29 U/L (ref 10–35)
Albumin: 3.1 g/dL — ABNORMAL LOW (ref 3.6–5.1)
Alkaline phosphatase (APISO): 120 U/L (ref 33–130)
BILIRUBIN DIRECT: 0.6 mg/dL — AB (ref 0.0–0.2)
BILIRUBIN TOTAL: 1.7 mg/dL — AB (ref 0.2–1.2)
Globulin: 2.4 g/dL (calc) (ref 1.9–3.7)
Indirect Bilirubin: 1.1 mg/dL (calc) (ref 0.2–1.2)
Total Protein: 5.5 g/dL — ABNORMAL LOW (ref 6.1–8.1)

## 2017-10-03 LAB — BASIC METABOLIC PANEL WITH GFR
BUN/Creatinine Ratio: 11 (calc) (ref 6–22)
BUN: 16 mg/dL (ref 7–25)
CO2: 36 mmol/L — ABNORMAL HIGH (ref 20–32)
Calcium: 9 mg/dL (ref 8.6–10.4)
Chloride: 94 mmol/L — ABNORMAL LOW (ref 98–110)
Creat: 1.41 mg/dL — ABNORMAL HIGH (ref 0.60–0.93)
GFR, EST AFRICAN AMERICAN: 44 mL/min/{1.73_m2} — AB (ref >=60)
GFR, Est Non African American: 38 mL/min/{1.73_m2} — ABNORMAL LOW (ref >=60)
Glucose, Bld: 82 mg/dL (ref 65–99)
POTASSIUM: 4 mmol/L (ref 3.5–5.3)
SODIUM: 137 mmol/L (ref 135–146)

## 2017-10-03 LAB — AMMONIA: Ammonia: 54 umol/L (ref <=72)

## 2017-10-03 LAB — MAGNESIUM: Magnesium: 2 mg/dL (ref 1.5–2.5)

## 2017-10-03 MED ORDER — ALPRAZOLAM 0.25 MG PO TABS: ORAL_TABLET | ORAL | 0 refills | Status: DC

## 2017-10-04 ENCOUNTER — Ambulatory Visit: Payer: PPO | Admitting: Physician Assistant

## 2017-10-04 ENCOUNTER — Encounter: Payer: Self-pay | Admitting: Physician Assistant

## 2017-10-04 VITALS — BP 142/58 | HR 62 | Ht 62.0 in | Wt 172.0 lb

## 2017-10-04 DIAGNOSIS — R188 Other ascites: Secondary | ICD-10-CM

## 2017-10-04 DIAGNOSIS — B1920 Unspecified viral hepatitis C without hepatic coma: Secondary | ICD-10-CM

## 2017-10-04 DIAGNOSIS — C220 Liver cell carcinoma: Secondary | ICD-10-CM

## 2017-10-04 DIAGNOSIS — G9340 Encephalopathy, unspecified: Secondary | ICD-10-CM | POA: Diagnosis not present

## 2017-10-04 DIAGNOSIS — K7469 Other cirrhosis of liver: Secondary | ICD-10-CM

## 2017-10-04 NOTE — Progress Notes (Signed)
Subjective:    Patient ID: [name], female    DOB: Jan 23, 1946, 72 y.o.   MRN: [id number]  HPI [name] is a pleasant 72 year old white female, known to Dr. Havery Moros with multiple serious medical problems who comes in today for post hospital follow-up after recent hospitalization 211 through 09/28/2017. Patient has history of decompensated [name] cirrhosis with ascites, history of esophageal varices, and recently diagnosed with hepatocellular carcinoma. She had undergone ablation 2 per IR for multifocal hepatocellular carcinoma. She also has history of congestive heart failure and chronic respiratory failure/COPD and is on chronic oxygen. Patient had an admission from 2-3 09/13/2017 for anasarca and acute on chronic respiratory failure she underwent thoracentesis during that admission. She required readmission on 211 for recurrent respiratory failure and was found to have reaccumulation of right-sided pleural effusion. It was suspected this may be secondary to underlying hepatic hydrothorax. She had repeat thoracentesis on 09/17/2017 with 900 mL of fluid removed. She was also diagnosed with pneumonia, has grade 2 chronic diastolic heart failure, and chronic kidney disease. 9 She was seen by GI during this last admission to help with volume management. Her diuretics were significantly increased during her hospitalization, and she did have good response with diuresis. Her weight on 09/26/2017 was 192, on 223 she was down to 182. Today she is down to 172. Palliative care consultation was done during hospitalization but she and her husband are not amenable to hospice at this time and she wants to continue aggressive management. They have been referred to oncology and have an appointment with Dr. Burr Medico  next week.  Husband was concerned because she had been shaky and jittery earlier this week. He says she did not appear to be confused, and has not been excessively sleepy. He felt that she was having  significant anxiety. She didn't sleep for couple of nights in a row. Base of internal medicine yesterday and were given a prescription for Xanax. He gave her 1 last night and again this morning and feels that she is calmer. She denies any abdominal pain, she says her abdomen is still sore after the ablation. She has not been eating much, has intermittent nausea for which she is been using Zofran. She is taking Xifaxan twice daily. They have decrease her lactulose to 15-20 mL once daily. Her husband says she still has 2-3 bowel movements per day. Labs done yesterday with hemoglobin 8.5, hematocrit of 26.2, platelets 89, BUN 16, creatinine 1.4 which is stable. Creatinine 10 days ago 1.31, T bili 1.7 LFTs otherwise unremarkable, venous ammonia 54.    Review of Systems Pertinent positive and negative review of systems were noted in the above HPI section.  All other review of systems was otherwise negative.  Outpatient Encounter Medications as of 10/04/2017  Medication Sig  . albuterol (PROVENTIL HFA;VENTOLIN HFA) 108 (90 Base) MCG/ACT inhaler Inhale 2 puffs into the lungs every 6 (six) hours as needed for wheezing or shortness of breath.  . allopurinol (ZYLOPRIM) 300 MG tablet take 1 tablet (300 mg) by mouth daily with supper - for gout  . ALPRAZolam (XANAX) 0.25 MG tablet Take 1 tab as needed for anxiety, cramping, air hunger every 8 hours as needed.  Marland Kitchen aspirin EC 81 MG tablet Take 81 mg by mouth at bedtime.  . bisoprolol (ZEBETA) 10 MG tablet Take 1 tablet (10 mg total) by mouth daily. (Patient taking differently: Take 10 mg by mouth daily with supper. )  . budesonide (PULMICORT) 0.25 MG/2ML nebulizer solution  Take 2 mLs (0.25 mg total) by nebulization 2 (two) times daily.  . Cholecalciferol (VITAMIN D3) 5000 units CAPS Take 5,000 Units by mouth daily with breakfast.  . citalopram (CELEXA) 20 MG tablet Take 1 tablet (20 mg total) by mouth daily with supper.  . cyclobenzaprine (FLEXERIL) 10 MG tablet  Take 1 tablet (10 mg total) by mouth 3 (three) times daily as needed for muscle spasms.  Marland Kitchen dextromethorphan-guaiFENesin (MUCINEX DM) 30-600 MG 12hr tablet Take 1 tablet by mouth 2 (two) times daily as needed for cough.  . ferrous sulfate 325 (65 FE) MG tablet Take 325 mg by mouth daily.   Marland Kitchen gabapentin (NEURONTIN) 400 MG capsule Take 1 capsule (400 mg total) by mouth 3 (three) times daily.  Marland Kitchen glucose blood test strip Take sugars once daily  . ipratropium (ATROVENT) 0.02 % nebulizer solution use 1 vial in nebulizer EVERY 4 HOURS AS NEEDED FOR WHEEZING OR SHORTNESS OF BREATH 75/15=5  . lactulose (CHRONULAC) 10 GM/15ML solution take 62ms TWICE DAILY (Patient taking differently: Take 10 g by mouth daily. )  . Magnesium 250 MG TABS Take 250 mg by mouth at bedtime.   . NONFORMULARY OR COMPOUNDED ITEM Apply 1 application topically See admin instructions. SALICYLIC ACID ACID (PETROLATUM) 50% OINTMENT-APPLY TO AFFECTED AREA AT BEDTIME PRN DRY SKIN/LEGS (Compounded at CDeputy  . nystatin cream (MYCOSTATIN) Apply 1 application topically 2 (two) times daily. (Patient taking differently: Apply 1 application topically 2 (two) times daily as needed for dry skin (itching). )  . ondansetron (ZOFRAN) 4 MG tablet Take 1 tablet (4 mg total) by mouth every 6 (six) hours as needed for nausea or vomiting.  . OXYGEN Inhale 2-3 L into the lungs continuous. 3 L with exertion  . pantoprazole (PROTONIX) 40 MG tablet Take 1 tablet (40 mg total) by mouth daily. (Patient taking differently: Take 40 mg by mouth daily with supper. )  . potassium chloride SA (K-DUR,KLOR-CON) 20 MEQ tablet Take 1 tablet (20 mEq total) by mouth 2 (two) times daily.  . rifampin (RIFADIN) 300 MG capsule Take 1 capsule 2 x / day for Liver (Patient taking differently: Take 300 mg by mouth 2 (two) times daily with a meal. for Liver)  . spironolactone (ALDACTONE) 50 MG tablet Take 3 tablets (150 mg total) by mouth daily.  .Marland Kitchenterconazole (TERAZOL  7) 0.4 % vaginal cream Place 1 applicator vaginally at bedtime. (Patient taking differently: Place 1 applicator vaginally at bedtime as needed (itching). )  . torsemide (DEMADEX) 10 MG tablet Take 3 tablets (30 mg total) by mouth every morning.  . torsemide (DEMADEX) 20 MG tablet Take 3 tablets (60 mg total) by mouth every evening.  . traMADol (ULTRAM) 50 MG tablet Take 50 mg by mouth every 12 hours as needed for pain  . triamcinolone cream (KENALOG) 0.1 % Apply 1 application topically 3 (three) times daily as needed for pain (Patient taking differently: Apply 1 application topically 3 (three) times daily as needed (itching). )   No facility-administered encounter medications on file as of 10/04/2017.    Allergies  Allergen Reactions  . Atorvastatin Other (See Comments)    Unknown reaction  . Diphenhydramine Hcl (Sleep) Hives  . Hydrocodone-Acetaminophen Other (See Comments)    Unknown reaction  . Lopid [Gemfibrozil] Other (See Comments)    Unknown reaction  . Loratadine Hives  . Lorazepam Hives  . Simvastatin Other (See Comments)    Unknown reaction  . Sulfamethoxazole Hives  . Sulfonamide Derivatives Hives  Patient Active Problem List   Diagnosis Date Noted  . OSA (obstructive sleep apnea)   . Obesity hypoventilation syndrome ([place])   . Lobar pneumonia ([place])   . HCAP (healthcare-associated pneumonia)   . Recurrent right pleural effusion   . NASH (nonalcoholic steatohepatitis)   . Liver cirrhosis secondary to nonalcoholic steatohepatitis (NASH) ([place])   . Chronic diastolic CHF (congestive heart failure) ([place])   . Macrocytic anemia   . CKD (chronic kidney disease), stage III ([place])   . Hypoxia   . Palliative care by specialist   . Acute diastolic CHF (congestive heart failure) ([place]) 09/11/2017  . Pleural effusion 09/11/2017  . Ascites 09/07/2017  . Hepatocellular carcinoma ([place]) 08/30/2017  . Anasarca 12/14/2016  . Obesity (BMI 30-39.9) 10/12/2016  . Hyperammonemia ([place])  10/12/2016  . Atrial tachycardia, multifocal ([place])   . Chronic respiratory failure ([place]) 01/19/2016  . Hepatic encephalopathy ([place]) 09/21/2015  . Hypokalemia 09/21/2015  . Acute on chronic respiratory failure with hypoxemia ([place]) 08/07/2015  . Thrombocytopenia ([place]) 08/07/2015  . Venous insufficiency 07/26/2015  . Goals of care, counseling/discussion 12/14/2013  . Essential hypertension   . Hyperlipidemia   . GERD   . Vitamin D deficiency   . IBS   . Fibromyalgia   . Esophageal varices ([place]) 07/08/2012  . COLONIC POLYPS 11/11/2008  . Anemia 05/28/2008  . Cirrhosis ([place]) 05/28/2008  . Esophagitis 05/27/2008  . Diverticulosis of large intestine 05/27/2008   Social History   Socioeconomic History  . Marital status: Married    Spouse name: [name]  . Number of children: 5  . Years of education: Not on file  . Highest education level: Not on file  Social Needs  . Financial resource strain: Not on file  . Food insecurity - worry: Not on file  . Food insecurity - inability: Not on file  . Transportation needs - medical: Not on file  . Transportation needs - non-medical: Not on file  Occupational History  . Occupation: Arboriculturist: UNEMPLOYED  Tobacco Use  . Smoking status: Former Smoker    Packs/day: 1.00    Years: 50.00    Pack years: 50.00    Types: Cigarettes    Last attempt to quit: 11/05/2014    Years since quitting: 2.9  . Smokeless tobacco: Never Used  . Tobacco comment: Quit April 2016  Substance and Sexual Activity  . Alcohol use: No    Alcohol/week: 0.0 oz  . Drug use: No  . Sexual activity: Not on file  Other Topics Concern  . Not on file  Social History Narrative  . Not on file    [name]'s family history includes Atrial fibrillation in her sister; Cancer in her father, maternal uncle, and other; Diabetes in her brother; Heart disease in her mother and sister.      Objective:    Vitals:   10/04/17 1425  BP: (!) 142/58  Pulse: 62     Physical Exam; well-developed older white female, in a wheelchair, on nasal oxygen accompanied by her husband who does most of the talking. Blood pressure 142/58, pulse 62. Height 5 foot 2, weight 172, BMI 31.4-weight is down 20 pounds from hospital weight documented on 09/16/2017.  HEENT; nontraumatic normocephalic EOMI PERRLA sclera anicteric, Cardiovascular; regular rate and rhythm with S1-S2, Pulmonary;breath sounds bilateral bases or few scattered rhonchi in the bases, Abdomen; remains protuberant with non-tense ascites bowel sounds are present, no palpable mass or hepatosplenomegaly, has mild rather generalized tenderness, Rectal; exam  not done, Extremities; no peripheral edema, skin warm dry, Neuropsych ;patient is alert and oriented to date and time. Mild asterixis       Assessment & Plan:   #110 72 year old female with decompensated [name] cirrhosis now complicated by multifocal hepatocellular CA status post radiofrequency ablation of 2 hepatic lesions 08/30/2017. (IR plans to see her in 2 months)) Patient has had 2 hospitalizations since, the last was 211 through 09/28/2017. She had acute on chronic respiratory failure, currently on 5 L nasal cannula, using CPAP BiPAP at nighttime. Severe COPD Right lower lobe pneumonia positive for RSV Recurrent right pleural effusion concerning for hepatic hydrothorax-patient status post right thoracentesis with removal of 900 mL. History of esophageal varices-last EGD 2015 Chronic congestive heart failure Chronic kidney disease stage III Ascites-last paracentesis done during hospitalization with removal of 2.6 L. Diuretics have been significantly increased, currently on Aldactone 50 mg, taking 3 by mouth every morning( 150 mg total), and torsemide 20 mg tablets, 3 by mouth every morning and 3 by mouth every afternoon. Labs done yesterday with stable renal function. We'll continue same doses for now, and plan to repeat be met in 2 weeks  History of  hepatic encephalopathy-continue Xifaxan 550 by mouth twice daily, continue Chronulac 20 mL by mouth every morning. Husband instructed to promptly increased to 30 mL twice daily for any signs of confusion or lethargy or somnolence.  Question anxiety-not clear to me whether this is actually anxiety or whether this may have been hepatic encephalopathy. Patient was given a prescription for Xanax to be used 3 times daily as needed by internal medicine yesterday. I instructed husband today that she should not be given Xanax on a regular basis due to her severe underlying liver disease. He was advised to give her a half tablet at a time and not to be giving this on any sort of scheduled basis, and only when necessary for obvious severe anxiety.  Patient has oncology consultation next week/Dr. Burr Medico- pt  and husband have declined hospice as of most recent hospitalization.  Patient will follow up with Dr. Havery Moros in one month.        Amy S Esterwood PA-C 10/04/2017   Cc: Unk Pinto, MD

## 2017-10-04 NOTE — Patient Instructions (Signed)
Come to our lab, basement level on 10-18-2017 for labs. Anytime between 7:30 am and 5:30 PM.   Only use 1/2 Xanax at a time- do not give regular doses use sparingly.  Continue meds same for now.  Increase Lactulose to 30 cc's twice daily for any sign of confusion, sleepiness and stop the Xanax.

## 2017-10-04 NOTE — Progress Notes (Signed)
Agree with assessment and plan as outlined for this complex patient.  Agree that she should not be using Xanax with a history of encephalopathy.  Hopefully she continues to respond to diuresis, will need to keep an eye on renal function.  She has not been on nonselective beta blocker for varices due to COPD. Likewise she had declined banding / EGD in light of her respiratory status. They have declined hospice thus far.  We will see her back in 1 month.

## 2017-10-08 ENCOUNTER — Ambulatory Visit: Payer: Self-pay | Admitting: Physician Assistant

## 2017-10-09 ENCOUNTER — Other Ambulatory Visit: Payer: PPO

## 2017-10-09 ENCOUNTER — Ambulatory Visit: Payer: PPO | Admitting: Hematology

## 2017-10-10 ENCOUNTER — Telehealth: Payer: Self-pay | Admitting: Hematology

## 2017-10-10 NOTE — Telephone Encounter (Signed)
Spoke with patients husband regarding new appointment Date/Time per 3/7 sch msg

## 2017-10-14 ENCOUNTER — Other Ambulatory Visit: Payer: Self-pay

## 2017-10-14 ENCOUNTER — Other Ambulatory Visit: Payer: Self-pay | Admitting: Adult Health

## 2017-10-17 ENCOUNTER — Other Ambulatory Visit: Payer: Self-pay

## 2017-10-17 MED ORDER — BISOPROLOL FUMARATE 10 MG PO TABS: 10.0000 mg | ORAL_TABLET | Freq: Every day | ORAL | 0 refills | Status: DC

## 2017-10-18 ENCOUNTER — Other Ambulatory Visit (INDEPENDENT_AMBULATORY_CARE_PROVIDER_SITE_OTHER): Payer: PPO

## 2017-10-18 DIAGNOSIS — C220 Liver cell carcinoma: Secondary | ICD-10-CM | POA: Diagnosis not present

## 2017-10-18 DIAGNOSIS — G9340 Encephalopathy, unspecified: Secondary | ICD-10-CM | POA: Diagnosis not present

## 2017-10-18 DIAGNOSIS — K7469 Other cirrhosis of liver: Secondary | ICD-10-CM

## 2017-10-18 DIAGNOSIS — B1920 Unspecified viral hepatitis C without hepatic coma: Secondary | ICD-10-CM

## 2017-10-18 DIAGNOSIS — R188 Other ascites: Secondary | ICD-10-CM | POA: Diagnosis not present

## 2017-10-18 LAB — CBC WITH DIFFERENTIAL/PLATELET
BASOS ABS: 0 10*3/uL (ref 0.0–0.1)
Basophils Relative: 0.4 % (ref 0.0–3.0)
EOS ABS: 0.1 10*3/uL (ref 0.0–0.7)
Eosinophils Relative: 2 % (ref 0.0–5.0)
HEMATOCRIT: 29.2 % — AB (ref 36.0–46.0)
HEMOGLOBIN: 9.7 g/dL — AB (ref 12.0–15.0)
LYMPHS PCT: 17.7 % (ref 12.0–46.0)
Lymphs Abs: 0.7 10*3/uL (ref 0.7–4.0)
MCHC: 33.3 g/dL (ref 30.0–36.0)
MCV: 98.4 fl (ref 78.0–100.0)
MONOS PCT: 6.7 % (ref 3.0–12.0)
Monocytes Absolute: 0.3 10*3/uL (ref 0.1–1.0)
NEUTROS ABS: 3 10*3/uL (ref 1.4–7.7)
Neutrophils Relative %: 73.2 % (ref 43.0–77.0)
PLATELETS: 98 10*3/uL — AB (ref 150.0–400.0)
RBC: 2.97 Mil/uL — AB (ref 3.87–5.11)
RDW: 17.4 % — ABNORMAL HIGH (ref 11.5–15.5)
WBC: 4.1 10*3/uL (ref 4.0–10.5)

## 2017-10-18 LAB — COMPREHENSIVE METABOLIC PANEL
ALBUMIN: 3 g/dL — AB (ref 3.5–5.2)
ALK PHOS: 144 U/L — AB (ref 39–117)
ALT: 23 U/L (ref 0–35)
AST: 28 U/L (ref 0–37)
BILIRUBIN TOTAL: 1.2 mg/dL (ref 0.2–1.2)
BUN: 20 mg/dL (ref 6–23)
CALCIUM: 9.2 mg/dL (ref 8.4–10.5)
CO2: 35 meq/L — AB (ref 19–32)
CREATININE: 1.2 mg/dL (ref 0.40–1.20)
Chloride: 96 mEq/L (ref 96–112)
GFR: 47.08 mL/min — AB (ref >60.00)
Glucose, Bld: 111 mg/dL — ABNORMAL HIGH (ref 70–99)
Potassium: 4.3 mEq/L (ref 3.5–5.1)
Sodium: 135 mEq/L (ref 135–145)
TOTAL PROTEIN: 5.7 g/dL — AB (ref 6.0–8.3)

## 2017-10-18 LAB — AMMONIA: Ammonia: 71 umol/L — ABNORMAL HIGH (ref 11–35)

## 2017-10-20 NOTE — Progress Notes (Signed)
CPE AND FOLLOW UP  Assessment:   Secondary esophageal varices without bleeding (HCC) continue follow up GI Continue meds  Atrial tachycardia, multifocal (HCC) Continue O2, continue to monitor  Chronic respiratory failure with hypoxia (HCC) Continue O2, continue to monitor  COPD mixed type (HCC) On 2L O2  Cirrhosis of liver without ascites, unspecified hepatic cirrhosis type ([place]) continue follow up GI Continue meds  Hepatic encephalopathy (HCC) Continue lactulose  Thrombocytopenia (HCC) Monitor CBC  Hyperammonemia (HCC) Continue lactulose  Essential hypertension - continue medications, DASH diet, exercise and monitor at home. Call if greater than 130/80.  -     CBC with Differential/Platelet -     BASIC METABOLIC PANEL WITH GFR -     Hepatic function panel -     TSH -     Urinalysis, Routine w reflex microscopic -     Microalbumin / creatinine urine ratio  Anemia, unspecified type Monitor closely- likely multifactorial  Hyperlipidemia, unspecified hyperlipidemia type -continue medications, check lipids, decrease fatty foods, increase activity.   Hypokalemia Due to medications, getting BMP 2 weeks with GI  QT prolongation Monitor, avoid meds that prolong QT, check electrolytes.  Anasarca Continue meds, fluid restrict  Venous insufficiency Elevate legs, better with aggressive diuersis  Benign neoplasm of colon, unspecified part of colon Monitor  Esophagitis Monitor  GERD Continue PPI/H2 blocker, diet discussed  Irritable bowel syndrome, unspecified type monitor  Diverticulosis of large intestine without hemorrhage Increase fiber  Fibromyalgia Continue mediations  Encounter for Medicare annual wellness exam 1 YEAR  Obesity  - long discussion about weight loss, diet, and exercise - fluid restriction and decreasing salt/sugar  Chronic low back pain with bilateral sciatica, unspecified back pain laterality Follow up with ortho   Acquired  dysmorphic toenail -     doxycycline (VIBRAMYCIN) 100 MG capsule; Take 1 capsule twice daily with food -     Ambulatory referral to Podiatry  Neuropathy Monitor  Rash -     doxycycline (VIBRAMYCIN) 100 MG capsule; Take 1 capsule twice daily with food - on left foot- no warmth, nontender, try hydrocortisone first, if it gets larger start on doxy  Chronic diastolic CHF (congestive heart failure) ([place]) Continue aggressive dieureiss, monitor weight  Obesity hypoventilation syndrome (HCC) Continue CPAP  Recurrent right pleural effusion aggressive dieureiss  Hepatocellular carcinoma ([place]) Has follow up Dr. Burr Medico  CKD (chronic kidney disease), stage III ([place]) Will monitor closely with aggressive dieureiss  LONG DISCUSSION WITH HUSBAND AND PATIENT ABOUT PATIENTS CHRONIC CO MORBID CONDITIONS, DECLINES HOSPICE AT THIS TIME, WILL CONTINUE TO DISCUSS AND ACCESS  Over 40 minutes of exam, counseling, chart review, and critical decision making was performed Future Appointments  Date Time Provider [place]  11/08/2017 11:00 AM [facility] 4 [facility] None  11/08/2017 11:30 AM Truitt Merle, MD [facility] None  12/10/2017  2:30 PM Armbruster, Carlota Raspberry, MD [facility]  02/03/2018  2:30 PM Unk Pinto, MD [facility] None  06/09/2018 10:00 AM Vicie Mutters, PA-C [facility] None     Subjective:   [name] is a 72 y.o. chronically ill female who presents for CPE and follow up for multitude of chronic medical co morbidities.   She follows with Dr. Havery Moros, NASH and most recently had RFA for hepatocellular carcinoma, she has history of esophageal varices and but is on bisoprolol rather than naldalol due to lung function. She has had several admissions for encephalopathy, she has been taking her lactulose per her husband and on riphamin 334m BID due  to cost which has been helping decrease lactulose use, she is on 30 BID at this time. She was started on CPAP in the  hospital.   Her diuretics have been significantly increased, she is on aldactone 66m 3 in the morning, demadex 160m3 pills (3086min the AM and 60 mg in the PM- occ will not get the night time dose Wt Readings from Last 4 Encounters:  10/22/17 170 lb (77.1 kg)  10/04/17 172 lb (78 kg)  10/03/17 176 lb (79.8 kg)  10/01/17 178 lb (80.7 kg)    Her recent labs at GI were hemoglobin 8.5, hematocrit of 26.2, platelets 89, BUN 16, creatinine 1.4 which is stable. Creatinine 10 days ago 1.31, T bili 1.7 LFTs otherwise unremarkable, venous ammonia 54.  She was given xanax for anxiety but at GI it was advised to not take due to history of encephalopathy.   She has a new rash on her left foot, she has neuropathy bilateral feet and swelling. She has dystrophic nails as well and husband would like her to see a podiatrist which I agree with.   She has chronic pain and muscle aches, has history of compression fractures s/p cementing, states currently she can not stand due to pain in her back, she is on gabapentin which was decreased, requires help for ADL's from husband.  Will have tramadol 1 before bed.  [facility], HAVE DISCUSSED, will continue to monitor it.  Her blood pressure has been controlled at home, today their BP is BP: 130/64 She does not workout. She denies chest pain, shortness of breath, dizziness.  She is not on cholesterol medication and denies myalgias. Her cholesterol is not at goal. The cholesterol last visit was:   Lab Results  Component Value Date   CHOL 177 05/31/2017   HDL 53 05/31/2017   LDLCALC 101 (H) 05/31/2017   TRIG 130 05/31/2017   CHOLHDL 3.3 05/31/2017   She has not been working on diet and exercise for diabetes with CKD however sugars have decreased dramatically likely with decrease in renal function and decreased appeitie. Last A1C in the office was:  Lab Results  Component Value Date   HGBA1C 5.1 09/03/2017   Last GFR Lab Results  Component Value Date    GFRNONAA 38 (L) 10/03/2017   Patient is on Vitamin D supplement. Lab Results  Component Value Date   VD25OH 59 96/29/2019     Patient is on allopurinol for gout and does not report a recent flare.  Lab Results  Component Value Date   LABURIC 5.8 09/03/2017   She is on celexa for depression, in partial remission.  BMI is Body mass index is 31.09 kg/m., she is working on diet and exercise. Wt Readings from Last 3 Encounters:  10/22/17 170 lb (77.1 kg)  10/04/17 172 lb (78 kg)  10/03/17 176 lb (79.8 kg)    Medication Review Current Outpatient Medications on File Prior to Visit  Medication Sig  . albuterol (PROVENTIL HFA;VENTOLIN HFA) 108 (90 Base) MCG/ACT inhaler Inhale 2 puffs into the lungs every 6 (six) hours as needed for wheezing or shortness of breath.  . allopurinol (ZYLOPRIM) 300 MG tablet take 1 tablet (300 mg) by mouth daily with supper - for gout  . ALPRAZolam (XANAX) 0.25 MG tablet Take 1 tab as needed for anxiety, cramping, air hunger every 8 hours as needed.  . aMarland Kitchenpirin EC 81 MG tablet Take 81 mg by mouth at bedtime.  . bisoprolol (ZEBETA) 10 MG  tablet Take 1 tablet (10 mg total) by mouth daily.  . budesonide (PULMICORT) 0.25 MG/2ML nebulizer solution Take 2 mLs (0.25 mg total) by nebulization 2 (two) times daily.  . Cholecalciferol (VITAMIN D3) 5000 units CAPS Take 5,000 Units by mouth daily with breakfast.  . citalopram (CELEXA) 20 MG tablet Take 1 tablet (20 mg total) by mouth daily with supper.  . cyclobenzaprine (FLEXERIL) 10 MG tablet Take 1 tablet (10 mg total) by mouth 3 (three) times daily as needed for muscle spasms.  . ferrous sulfate 325 (65 FE) MG tablet Take 325 mg by mouth daily.   Marland Kitchen gabapentin (NEURONTIN) 400 MG capsule Take 1 capsule (400 mg total) by mouth 3 (three) times daily.  Marland Kitchen glucose blood test strip Take sugars once daily  . ipratropium (ATROVENT) 0.02 % nebulizer solution use 1 vial in nebulizer EVERY 4 HOURS AS NEEDED FOR WHEEZING OR  SHORTNESS OF BREATH 75/15=5  . lactulose (CHRONULAC) 10 GM/15ML solution take 82ms TWICE DAILY (Patient taking differently: Take 10 g by mouth daily. )  . Magnesium 250 MG TABS Take 250 mg by mouth at bedtime.   . NONFORMULARY OR COMPOUNDED ITEM Apply 1 application topically See admin instructions. SALICYLIC ACID ACID (PETROLATUM) 50% OINTMENT-APPLY TO AFFECTED AREA AT BEDTIME PRN DRY SKIN/LEGS (Compounded at [place]  . nystatin cream (MYCOSTATIN) Apply 1 application topically 2 (two) times daily. (Patient taking differently: Apply 1 application topically 2 (two) times daily as needed for dry skin (itching). )  . ondansetron (ZOFRAN) 4 MG tablet Take 1 tablet (4 mg total) by mouth every 6 (six) hours as needed for nausea or vomiting.  . OXYGEN Inhale 2-3 L into the lungs continuous. 3 L with exertion  . pantoprazole (PROTONIX) 40 MG tablet Take 1 tablet (40 mg total) by mouth daily. (Patient taking differently: Take 40 mg by mouth daily with supper. )  . potassium chloride SA (K-DUR,KLOR-CON) 20 MEQ tablet Take 1 tablet (20 mEq total) by mouth 2 (two) times daily.  . rifampin (RIFADIN) 300 MG capsule Take 1 capsule TWICE DAILY day for Liver  . spironolactone (ALDACTONE) 50 MG tablet Take 3 tablets (150 mg total) by mouth daily.  .Marland Kitchenterconazole (TERAZOL 7) 0.4 % vaginal cream Place 1 applicator vaginally at bedtime. (Patient taking differently: Place 1 applicator vaginally at bedtime as needed (itching). )  . torsemide (DEMADEX) 10 MG tablet Take 3 tablets (30 mg total) by mouth every morning.  . torsemide (DEMADEX) 20 MG tablet Take 3 tablets (60 mg total) by mouth every evening.  . traMADol (ULTRAM) 50 MG tablet Take 50 mg by mouth every 12 hours as needed for pain  . triamcinolone cream (KENALOG) 0.1 % Apply 1 application topically 3 (three) times daily as needed for pain (Patient taking differently: Apply 1 application topically 3 (three) times daily as needed (itching). )   No  current facility-administered medications on file prior to visit.     Current Problems (verified) Patient Active Problem List   Diagnosis Date Noted  . Obesity hypoventilation syndrome ([place]   . Recurrent right pleural effusion   . NASH (nonalcoholic steatohepatitis)   . Liver cirrhosis secondary to nonalcoholic steatohepatitis (NASH) ([place]   . Chronic diastolic CHF (congestive heart failure) ([place]   . Macrocytic anemia   . CKD (chronic kidney disease), stage III ([place]   . Ascites 09/07/2017  . Hepatocellular carcinoma ([place] 08/30/2017  . Anasarca 12/14/2016  . Obesity (BMI 30-39.9) 10/12/2016  . Hyperammonemia ([place] 10/12/2016  .  Atrial tachycardia, multifocal ([place])   . Chronic respiratory failure ([place]) 01/19/2016  . Hepatic encephalopathy ([place]) 09/21/2015  . Hypokalemia 09/21/2015  . Thrombocytopenia ([place]) 08/07/2015  . Venous insufficiency 07/26/2015  . Goals of care, counseling/discussion 12/14/2013  . Essential hypertension   . Hyperlipidemia   . GERD   . Vitamin D deficiency   . IBS   . Fibromyalgia   . Esophageal varices ([place]) 07/08/2012  . COLONIC POLYPS 11/11/2008  . Anemia 05/28/2008  . Esophagitis 05/27/2008  . Diverticulosis of large intestine 05/27/2008    Screening Tests Immunization History  Administered Date(s) Administered  . Hep A / Hep B 11/05/2013, 11/12/2013, 12/07/2013, 12/31/2014  . Hepatitis B, adult 12/19/2015  . Hepatitis B, ped/adol 11/17/2015, 05/21/2016  . Influenza Split 05/26/2013, 06/17/2014  . Influenza, High Dose Seasonal PF 06/15/2015, 05/01/2017  . Influenza,inj,quad, With Preservative 05/22/2016  . Pneumococcal Conjugate-13 07/22/2015  . Pneumococcal Polysaccharide-23 08/28/2013  . Tdap 11/19/2012    Preventative care: Last colonoscopy: 2015 Last mammogram: 2012 per epic chart review, is followed by Dr. Lindi Adie for breast cancer history, she is unable to stand for [facility], will try to get [place]  DEXA:2016  Prior vaccinations: TD  or Tdap: 2014  Influenza: 2016  Pneumococcal: 2015 Prevnar13: 2016 Shingles/Zostavax: Declines, not indicated due to other chronic conditions  Names of Other Physician/Practitioners you currently use: 1. [facility]- here for primary care 2. Dr. Patrici Ranks, eye doctor, last visit several years ago, has upcoming appt 3. Dr. Johnella Moloney, dentist, last visit march/2018 Patient Care Team: Unk Pinto, MD as PCP - General (Internal Medicine) Inda Castle, MD (Inactive) as Consulting Physician (Gastroenterology) Izora Gala, MD as Consulting Physician (Otolaryngology)  Allergies Allergies  Allergen Reactions  . Atorvastatin Other (See Comments)    Unknown reaction  . Diphenhydramine Hcl (Sleep) Hives  . Hydrocodone-Acetaminophen Other (See Comments)    Unknown reaction  . Lopid [Gemfibrozil] Other (See Comments)    Unknown reaction  . Loratadine Hives  . Lorazepam Hives  . Simvastatin Other (See Comments)    Unknown reaction  . Sulfamethoxazole Hives  . Sulfonamide Derivatives Hives    SURGICAL HISTORY She  has a past surgical history that includes Hip Arthroplasty (Bilateral); Shoulder surgery (Right); Back surgery; Cholecystectomy; Neck surgery; Esophagogastroduodenoscopy (07/16/2012); gastric varices banding (07/16/2012); Abdominal hysterectomy; ir generic historical (06/25/2016); ir generic historical (06/27/2016); ir generic historical (07/17/2016); Cataract extraction, bilateral; IR Radiologist Eval & Mgmt (07/10/2017); Radiofrequency ablation (N/A, 08/30/2017); IR THORACENTESIS ASP PLEURAL SPACE W/IMG GUIDE (09/12/2017); IR THORACENTESIS ASP PLEURAL SPACE W/IMG GUIDE (09/17/2017); and IR Radiologist Eval & Mgmt (10/01/2017). FAMILY HISTORY Her family history includes Atrial fibrillation in her sister; Cancer in her father, maternal uncle, and other; Diabetes in her brother; Heart disease in her mother and sister. SOCIAL HISTORY She  reports that  she quit smoking about 2 years ago. Her smoking use included cigarettes. She has a 50.00 pack-year smoking history. she has never used smokeless tobacco. She reports that she does not drink alcohol or use drugs.  MEDICARE WELLNESS OBJECTIVES: Physical activity: Current Exercise Habits: The patient does not participate in regular exercise at present, Exercise limited by: respiratory conditions(s) Cardiac risk factors: Cardiac Risk Factors include: advanced age (>48mn, >>52women);diabetes mellitus;obesity (BMI >30kg/m2);sedentary lifestyle;hypertension;dyslipidemia Depression/mood screen:   [facility] 10/22/2017  Decreased Interest 1  Down, Depressed, Hopeless 1  PHQ - 2 Score 2  Altered sleeping 1  Tired, decreased energy 1  Change in appetite 0  Feeling  bad or failure about yourself  0  Trouble concentrating 0  Moving slowly or fidgety/restless 0  Suicidal thoughts 0  PHQ-9 Score 4  Difficult doing work/chores Somewhat difficult  Some recent data might be hidden    ADLs:  In your present state of health, do you have any difficulty performing the following activities: 10/22/2017 09/17/2017  Hearing? Y Y  Comment - -  Vision? N N  Difficulty concentrating or making decisions? N N  Walking or climbing stairs? Y Y  Dressing or bathing? Y Y  Doing errands, shopping? Y Y  Some recent data might be hidden     Cognitive Testing  Alert? Yes  Normal Appearance?Yes  Oriented to person? Yes  Place? Yes   Time? Yes  Recall of three objects?  Yes  Can perform simple calculations? Yes  Displays appropriate judgment?Yes  Can read the correct time from a watch face?Yes  EOL planning:     Objective:   Today's Vitals   10/22/17 1524  BP: 130/64  Pulse: 80  Temp: 97.9 F (36.6 C)  SpO2: 97%  Weight: 170 lb (77.1 kg)  Height: 5' 2"  (1.575 m)   Body mass index is 31.09 kg/m.  General Appearance: Appears ill, no acute distress Eyes: PERRLA, EOMs, conjunctiva no  swelling or erythema Sinuses: No Frontal/maxillary tenderness ENT/Mouth: Ext aud canals clear, TMs without erythema.Tonsils not swollen or erythematous, dry mucosa Neck: Supple, thyroid normal.  Respiratory: 3L O2 via [place], diffuse decreased breath sounds, wheezing bilateral lower lobes, BS equal bilaterally without rales, rhonchi or stridor.  Cardio: RRR with 2/6 systolic murmur. Decreased bilateral pulses with statsis dermatitis bilateral legs with 2-3+ edema, left ankle with erythema, swelling, tenderness no warmth, no pain at the joint.  Abdomen: Soft, + BS, Distended, LUQ/epigastric tenderness, no guarding, rebound, hernias, masses. Lymphatics: + bilateral tender cervical lymphadenopathy.  Musculoskeletal: Full ROM, 3/5 strength, in wheelchair, Decreased ROM left shoulder due to pain.  Skin: Warm, dry without rashes, lesions, ecchymosis.  Neuro: Cranial nerves intact. No Asterixis today Psych: Awake and oriented X 3, normal affect, Insight and Judgment appropriate.     Medicare Attestation I have personally reviewed: The patient's medical and social history Their use of alcohol, tobacco or illicit drugs Their current medications and supplements The patient's functional ability including ADLs,fall risks, home safety risks, cognitive, and hearing and visual impairment Diet and physical activities Evidence for depression or mood disorders  The patient's weight, height, BMI, and visual acuity have been recorded in the chart.  I have made referrals, counseling, and provided education to the patient based on review of the above and I have provided the patient with a written personalized care plan for preventive services.     Vicie Mutters, PA-C   10/22/2017

## 2017-10-21 ENCOUNTER — Other Ambulatory Visit: Payer: Self-pay | Admitting: Adult Health

## 2017-10-21 ENCOUNTER — Other Ambulatory Visit: Payer: Self-pay | Admitting: Internal Medicine

## 2017-10-21 MED ORDER — TORSEMIDE 10 MG PO TABS: 30.0000 mg | ORAL_TABLET | ORAL | 2 refills | Status: DC

## 2017-10-21 MED ORDER — TORSEMIDE 20 MG PO TABS: 60.0000 mg | ORAL_TABLET | Freq: Every evening | ORAL | 2 refills | Status: DC

## 2017-10-22 ENCOUNTER — Ambulatory Visit (INDEPENDENT_AMBULATORY_CARE_PROVIDER_SITE_OTHER): Payer: PPO | Admitting: Physician Assistant

## 2017-10-22 ENCOUNTER — Encounter: Payer: Self-pay | Admitting: Physician Assistant

## 2017-10-22 ENCOUNTER — Other Ambulatory Visit: Payer: Self-pay

## 2017-10-22 VITALS — BP 130/64 | HR 80 | Temp 97.9°F | Ht 62.0 in | Wt 170.0 lb

## 2017-10-22 DIAGNOSIS — E785 Hyperlipidemia, unspecified: Secondary | ICD-10-CM

## 2017-10-22 DIAGNOSIS — K7581 Nonalcoholic steatohepatitis (NASH): Secondary | ICD-10-CM

## 2017-10-22 DIAGNOSIS — E559 Vitamin D deficiency, unspecified: Secondary | ICD-10-CM

## 2017-10-22 DIAGNOSIS — Z0001 Encounter for general adult medical examination with abnormal findings: Secondary | ICD-10-CM | POA: Diagnosis not present

## 2017-10-22 DIAGNOSIS — Z7189 Other specified counseling: Secondary | ICD-10-CM

## 2017-10-22 DIAGNOSIS — G629 Polyneuropathy, unspecified: Secondary | ICD-10-CM

## 2017-10-22 DIAGNOSIS — C220 Liver cell carcinoma: Secondary | ICD-10-CM | POA: Diagnosis not present

## 2017-10-22 DIAGNOSIS — I5032 Chronic diastolic (congestive) heart failure: Secondary | ICD-10-CM | POA: Diagnosis not present

## 2017-10-22 DIAGNOSIS — K589 Irritable bowel syndrome without diarrhea: Secondary | ICD-10-CM

## 2017-10-22 DIAGNOSIS — K7290 Hepatic failure, unspecified without coma: Secondary | ICD-10-CM

## 2017-10-22 DIAGNOSIS — R188 Other ascites: Secondary | ICD-10-CM

## 2017-10-22 DIAGNOSIS — I872 Venous insufficiency (chronic) (peripheral): Secondary | ICD-10-CM | POA: Diagnosis not present

## 2017-10-22 DIAGNOSIS — E669 Obesity, unspecified: Secondary | ICD-10-CM

## 2017-10-22 DIAGNOSIS — L608 Other nail disorders: Secondary | ICD-10-CM

## 2017-10-22 DIAGNOSIS — K7682 Hepatic encephalopathy: Secondary | ICD-10-CM

## 2017-10-22 DIAGNOSIS — N183 Chronic kidney disease, stage 3 unspecified: Secondary | ICD-10-CM

## 2017-10-22 DIAGNOSIS — E662 Morbid (severe) obesity with alveolar hypoventilation: Secondary | ICD-10-CM

## 2017-10-22 DIAGNOSIS — E876 Hypokalemia: Secondary | ICD-10-CM

## 2017-10-22 DIAGNOSIS — D126 Benign neoplasm of colon, unspecified: Secondary | ICD-10-CM

## 2017-10-22 DIAGNOSIS — I10 Essential (primary) hypertension: Secondary | ICD-10-CM | POA: Diagnosis not present

## 2017-10-22 DIAGNOSIS — K7460 Unspecified cirrhosis of liver: Secondary | ICD-10-CM

## 2017-10-22 DIAGNOSIS — D649 Anemia, unspecified: Secondary | ICD-10-CM

## 2017-10-22 DIAGNOSIS — K2100 Gastro-esophageal reflux disease with esophagitis, without bleeding: Secondary | ICD-10-CM

## 2017-10-22 DIAGNOSIS — R21 Rash and other nonspecific skin eruption: Secondary | ICD-10-CM

## 2017-10-22 DIAGNOSIS — R6889 Other general symptoms and signs: Secondary | ICD-10-CM

## 2017-10-22 DIAGNOSIS — J9611 Chronic respiratory failure with hypoxia: Secondary | ICD-10-CM | POA: Diagnosis not present

## 2017-10-22 DIAGNOSIS — D539 Nutritional anemia, unspecified: Secondary | ICD-10-CM

## 2017-10-22 DIAGNOSIS — E7220 Disorder of urea cycle metabolism, unspecified: Secondary | ICD-10-CM

## 2017-10-22 DIAGNOSIS — K209 Esophagitis, unspecified without bleeding: Secondary | ICD-10-CM

## 2017-10-22 DIAGNOSIS — K5730 Diverticulosis of large intestine without perforation or abscess without bleeding: Secondary | ICD-10-CM

## 2017-10-22 DIAGNOSIS — I471 Supraventricular tachycardia: Secondary | ICD-10-CM | POA: Diagnosis not present

## 2017-10-22 DIAGNOSIS — J90 Pleural effusion, not elsewhere classified: Secondary | ICD-10-CM

## 2017-10-22 DIAGNOSIS — R601 Generalized edema: Secondary | ICD-10-CM

## 2017-10-22 DIAGNOSIS — K7469 Other cirrhosis of liver: Secondary | ICD-10-CM

## 2017-10-22 DIAGNOSIS — I8510 Secondary esophageal varices without bleeding: Secondary | ICD-10-CM

## 2017-10-22 DIAGNOSIS — M797 Fibromyalgia: Secondary | ICD-10-CM

## 2017-10-22 DIAGNOSIS — Z0000 Encounter for general adult medical examination without abnormal findings: Secondary | ICD-10-CM

## 2017-10-22 DIAGNOSIS — D696 Thrombocytopenia, unspecified: Secondary | ICD-10-CM

## 2017-10-22 MED ORDER — DOXYCYCLINE HYCLATE 100 MG PO CAPS: ORAL_CAPSULE | ORAL | 0 refills | Status: DC

## 2017-10-22 NOTE — Patient Instructions (Addendum)
Please put hydrocortisone on her left foot If the area of redness increases please given her doxycycline.   Try to take the xanax AS needed not daily See if her "shakiness" has improved with the lactulose at 92m twice a day IF not then can do the 1/2 xanax twice day

## 2017-10-25 DIAGNOSIS — J441 Chronic obstructive pulmonary disease with (acute) exacerbation: Secondary | ICD-10-CM | POA: Diagnosis not present

## 2017-10-25 DIAGNOSIS — J9610 Chronic respiratory failure, unspecified whether with hypoxia or hypercapnia: Secondary | ICD-10-CM | POA: Diagnosis not present

## 2017-10-25 DIAGNOSIS — J449 Chronic obstructive pulmonary disease, unspecified: Secondary | ICD-10-CM | POA: Diagnosis not present

## 2017-10-28 ENCOUNTER — Other Ambulatory Visit: Payer: PPO

## 2017-10-28 ENCOUNTER — Ambulatory Visit: Payer: PPO | Admitting: Hematology

## 2017-10-29 ENCOUNTER — Telehealth: Payer: Self-pay

## 2017-10-29 NOTE — Telephone Encounter (Signed)
JILL FROM [facility] REPORTS:  Pt has lost 4lbs since yesterday & wanted to make you aware of this.  Per provide pt should be losing weight on new meds & should cont her meds the same. If pt does have any new sxs then she should come into our office for a visit.   March 26th 2019 by DD

## 2017-11-01 ENCOUNTER — Encounter: Payer: Self-pay | Admitting: Podiatry

## 2017-11-01 ENCOUNTER — Ambulatory Visit: Payer: PPO | Admitting: Podiatry

## 2017-11-01 ENCOUNTER — Other Ambulatory Visit: Payer: Self-pay | Admitting: Adult Health

## 2017-11-01 DIAGNOSIS — E1151 Type 2 diabetes mellitus with diabetic peripheral angiopathy without gangrene: Secondary | ICD-10-CM | POA: Diagnosis not present

## 2017-11-01 DIAGNOSIS — B351 Tinea unguium: Secondary | ICD-10-CM | POA: Diagnosis not present

## 2017-11-01 DIAGNOSIS — E119 Type 2 diabetes mellitus without complications: Secondary | ICD-10-CM

## 2017-11-01 NOTE — Progress Notes (Signed)
Subjective:  Patient ID: [name], female    DOB: 06/13/1946,  MRN: [id number]  Chief Complaint  Patient presents with  . Nail Problem    bilateral great toenails are painful   72 y.o. female presents with the above complaint.  Reports bilateral great toenail pain.  Nurses history of diabetes now controlled only through diet where she previously took oral medicines only.  Denies numbness in the right foot endorses numbness to the left foot from an old injury Past Medical History:  Diagnosis Date  . Asthma   . Cancer [facility])    liver cancer   . COPD (chronic obstructive pulmonary disease) ([place])    x 3 years   . Depression   . Diabetes mellitus without complication ([place])    diet controlled  not on meds   . Edema    left leg   . Esophageal varices ([place])   . Fibromyalgia   . Gastritis   . GERD (gastroesophageal reflux disease)   . Hepatic cirrhosis ([place])   . Hepatic encephalopathy ([place])   . History of blood transfusion   . Hyperlipidemia   . Hypertension   . IBS (irritable bowel syndrome)   . Phlebitis    left leg x 2   . Pneumonia    hx of   . Splenomegaly   . Vitamin D deficiency    Past Surgical History:  Procedure Laterality Date  . ABDOMINAL HYSTERECTOMY    . BACK SURGERY    . CATARACT EXTRACTION, BILATERAL    . CHOLECYSTECTOMY    . ESOPHAGOGASTRODUODENOSCOPY  07/16/2012   Procedure: ESOPHAGOGASTRODUODENOSCOPY (EGD);  Surgeon: Inda Castle, MD;  Location: [facility];  Service: Endoscopy;  Laterality: N/A;  . GASTRIC VARICES BANDING  07/16/2012   Procedure: GASTRIC VARICES BANDING;  Surgeon: Inda Castle, MD;  Location: [facility];  Service: Endoscopy;  Laterality: N/A;  . HIP ARTHROPLASTY Bilateral   . IR GENERIC HISTORICAL  06/25/2016   IR RADIOLOGIST EVAL & MGMT 06/25/2016 [facility]  . IR GENERIC HISTORICAL  06/27/2016   IR KYPHO LUMBAR INC FX REDUCE BONE BX UNI/BIL CANNULATION INC/IMAGING 06/27/2016 Luanne Bras, MD [facility]  . IR  GENERIC HISTORICAL  07/17/2016   IR RADIOLOGIST EVAL & MGMT 07/17/2016 [facility]  . IR RADIOLOGIST EVAL & MGMT  07/10/2017  . IR RADIOLOGIST EVAL & MGMT  10/01/2017  . IR THORACENTESIS ASP PLEURAL SPACE W/IMG GUIDE  09/12/2017  . IR THORACENTESIS ASP PLEURAL SPACE W/IMG GUIDE  09/17/2017  . NECK SURGERY    . RADIOFREQUENCY ABLATION N/A 08/30/2017   Procedure: CT MICROWAVE THERMAL ABLATION;  Surgeon: Arne Cleveland, MD;  Location: [facility];  Service: Anesthesiology;  Laterality: N/A;  . SHOULDER SURGERY Right     Current Outpatient Medications:  .  albuterol (PROVENTIL HFA;VENTOLIN HFA) 108 (90 Base) MCG/ACT inhaler, Inhale 2 puffs into the lungs every 6 (six) hours as needed for wheezing or shortness of breath., Disp: 1 Inhaler, Rfl: 2 .  allopurinol (ZYLOPRIM) 300 MG tablet, take 1 tablet (300 mg) by mouth daily with supper - for gout, Disp: 30 tablet, Rfl: 0 .  ALPRAZolam (XANAX) 0.25 MG tablet, Take 1 tab as needed for anxiety, cramping, air hunger every 8 hours as needed., Disp: 90 tablet, Rfl: 0 .  aspirin EC 81 MG tablet, Take 81 mg by mouth at bedtime., Disp: , Rfl:  .  bisoprolol (ZEBETA) 10 MG tablet, Take 1 tablet (10 mg total) by mouth daily., Disp: 90 tablet,  Rfl: 0 .  budesonide (PULMICORT) 0.25 MG/2ML nebulizer solution, Take 2 mLs (0.25 mg total) by nebulization 2 (two) times daily., Disp: 60 mL, Rfl: 12 .  Cholecalciferol (VITAMIN D3) 5000 units CAPS, Take 5,000 Units by mouth daily with breakfast., Disp: , Rfl:  .  citalopram (CELEXA) 20 MG tablet, Take 1 tablet (20 mg total) by mouth daily with supper., Disp: 30 tablet, Rfl: 0 .  cyclobenzaprine (FLEXERIL) 10 MG tablet, Take 1 tablet (10 mg total) by mouth 3 (three) times daily as needed for muscle spasms., Disp: 90 tablet, Rfl: 2 .  doxycycline (VIBRAMYCIN) 100 MG capsule, Take 1 capsule twice daily with food, Disp: 20 capsule, Rfl: 0 .  ferrous sulfate 325 (65 FE) MG tablet, Take 325 mg by mouth daily. , Disp: , Rfl:  .   gabapentin (NEURONTIN) 400 MG capsule, Take 1 capsule (400 mg total) by mouth 3 (three) times daily., Disp: 90 capsule, Rfl: 0 .  glucose blood test strip, Take sugars once daily, Disp: 50 each, Rfl: 12 .  ipratropium (ATROVENT) 0.02 % nebulizer solution, use 1 vial in nebulizer EVERY 4 HOURS AS NEEDED FOR WHEEZING OR SHORTNESS OF BREATH 75/15=5, Disp: 75 mL, Rfl: 3 .  lactulose (CHRONULAC) 10 GM/15ML solution, take 52ms TWICE DAILY (Patient taking differently: Take 10 g by mouth daily. ), Disp: 2700 mL, Rfl: 2 .  Magnesium 250 MG TABS, Take 250 mg by mouth at bedtime. , Disp: , Rfl:  .  NONFORMULARY OR COMPOUNDED ITEM, Apply 1 application topically See admin instructions. SALICYLIC ACID ACID (PETROLATUM) 50% OINTMENT-APPLY TO AFFECTED AREA AT BEDTIME PRN DRY SKIN/LEGS (Compounded at [place], Disp: , Rfl:  .  nystatin cream (MYCOSTATIN), Apply 1 application topically 2 (two) times daily. (Patient taking differently: Apply 1 application topically 2 (two) times daily as needed for dry skin (itching). ), Disp: 30 g, Rfl: 1 .  ondansetron (ZOFRAN) 4 MG tablet, Take 1 tablet (4 mg total) by mouth every 6 (six) hours as needed for nausea or vomiting., Disp: 30 tablet, Rfl: 1 .  OXYGEN, Inhale 2-3 L into the lungs continuous. 3 L with exertion, Disp: , Rfl:  .  pantoprazole (PROTONIX) 40 MG tablet, Take 1 tablet (40 mg total) by mouth daily. (Patient taking differently: Take 40 mg by mouth daily with supper. ), Disp: 90 tablet, Rfl: 3 .  potassium chloride SA (K-DUR,KLOR-CON) 20 MEQ tablet, Take 1 tablet (20 mEq total) by mouth 2 (two) times daily., Disp: 60 tablet, Rfl: 0 .  rifampin (RIFADIN) 300 MG capsule, Take 1 capsule TWICE DAILY day for Liver, Disp: 180 capsule, Rfl: 1 .  spironolactone (ALDACTONE) 50 MG tablet, Take 3 tablets (150 mg total) by mouth daily., Disp: 90 tablet, Rfl: 0 .  terconazole (TERAZOL 7) 0.4 % vaginal cream, Place 1 applicator vaginally at bedtime. (Patient taking  differently: Place 1 applicator vaginally at bedtime as needed (itching). ), Disp: 45 g, Rfl: 2 .  torsemide (DEMADEX) 10 MG tablet, Take 3 tablets (30 mg total) by mouth every morning., Disp: 90 tablet, Rfl: 2 .  torsemide (DEMADEX) 20 MG tablet, Take 3 tablets (60 mg total) by mouth every evening., Disp: 90 tablet, Rfl: 2 .  traMADol (ULTRAM) 50 MG tablet, Take 50 mg by mouth every 12 hours as needed for pain, Disp: 10 tablet, Rfl: 0 .  triamcinolone cream (KENALOG) 0.1 %, Apply 1 application topically 3 (three) times daily as needed for pain (Patient taking differently: Apply 1 application topically  3 (three) times daily as needed (itching). ), Disp: 80 g, Rfl: 0  Allergies  Allergen Reactions  . Atorvastatin Other (See Comments)    Unknown reaction  . Diphenhydramine Hcl (Sleep) Hives  . Hydrocodone-Acetaminophen Other (See Comments)    Unknown reaction  . Lopid [Gemfibrozil] Other (See Comments)    Unknown reaction  . Loratadine Hives  . Lorazepam Hives  . Simvastatin Other (See Comments)    Unknown reaction  . Sulfamethoxazole Hives  . Sulfonamide Derivatives Hives   Review of Systems Objective:   General AA&O x3. Normal mood and affect.  Vascular Dorsalis pedis pulses present 1+ bilaterally  Posterior tibial pulses absent bilaterally  Capillary refill normal to all digits. Pedal hair growth normal.  Neurologic Epicritic sensation present bilaterally. Protective sensation with 5.07 monofilament  present bilaterally. Vibratory sensation present bilaterally.  Dermatologic No open lesions. Interspaces clear of maceration.  Normal skin temperature and turgor. Hyperkeratotic lesions: None bilaterally. Nails: brittle, onychomycosis, thickening, elongation  Orthopedic: No history of amputation. MMT 5/5 in dorsiflexion, plantarflexion, inversion, and eversion. Normal lower extremity joint ROM without pain or crepitus.     Assessment & Plan:  Patient was evaluated and treated  and all questions answered.  Diabetes with PAD, Onychomycosis -Educated on diabetic footcare. Diabetic risk level 1 -Nails x10 debrided sharply and manually with large nail nipper and rotary burr.  -  Return in about 3 months (around 02/01/2018) for Diabetic Foot Care.

## 2017-11-01 NOTE — Patient Instructions (Signed)

## 2017-11-05 ENCOUNTER — Other Ambulatory Visit: Payer: Self-pay | Admitting: Adult Health

## 2017-11-07 ENCOUNTER — Other Ambulatory Visit: Payer: Self-pay | Admitting: Hematology

## 2017-11-07 DIAGNOSIS — C220 Liver cell carcinoma: Secondary | ICD-10-CM

## 2017-11-08 ENCOUNTER — Other Ambulatory Visit: Payer: PPO

## 2017-11-08 ENCOUNTER — Ambulatory Visit: Payer: PPO | Admitting: Hematology

## 2017-11-11 ENCOUNTER — Ambulatory Visit: Payer: Self-pay | Admitting: Adult Health

## 2017-11-12 ENCOUNTER — Other Ambulatory Visit: Payer: Self-pay | Admitting: Physician Assistant

## 2017-11-12 MED ORDER — TRAMADOL HCL 50 MG PO TABS: ORAL_TABLET | ORAL | 0 refills | Status: DC

## 2017-11-13 ENCOUNTER — Other Ambulatory Visit (HOSPITAL_COMMUNITY): Payer: Self-pay | Admitting: Interventional Radiology

## 2017-11-13 ENCOUNTER — Other Ambulatory Visit: Payer: Self-pay | Admitting: Adult Health

## 2017-11-13 DIAGNOSIS — C220 Liver cell carcinoma: Secondary | ICD-10-CM

## 2017-11-13 DIAGNOSIS — M7062 Trochanteric bursitis, left hip: Secondary | ICD-10-CM | POA: Diagnosis not present

## 2017-11-13 DIAGNOSIS — G8929 Other chronic pain: Secondary | ICD-10-CM

## 2017-11-13 DIAGNOSIS — M25552 Pain in left hip: Secondary | ICD-10-CM | POA: Diagnosis not present

## 2017-11-13 DIAGNOSIS — M160 Bilateral primary osteoarthritis of hip: Secondary | ICD-10-CM | POA: Diagnosis not present

## 2017-11-13 MED ORDER — TRAMADOL HCL 50 MG PO TABS: ORAL_TABLET | ORAL | 0 refills | Status: DC

## 2017-11-14 ENCOUNTER — Other Ambulatory Visit: Payer: Self-pay | Admitting: Internal Medicine

## 2017-11-14 ENCOUNTER — Inpatient Hospital Stay (HOSPITAL_BASED_OUTPATIENT_CLINIC_OR_DEPARTMENT_OTHER): Payer: PPO | Admitting: Hematology

## 2017-11-14 ENCOUNTER — Inpatient Hospital Stay: Payer: PPO | Attending: Hematology

## 2017-11-14 ENCOUNTER — Telehealth: Payer: Self-pay | Admitting: Physician Assistant

## 2017-11-14 ENCOUNTER — Encounter: Payer: Self-pay | Admitting: Hematology

## 2017-11-14 ENCOUNTER — Telehealth: Payer: Self-pay

## 2017-11-14 ENCOUNTER — Telehealth: Payer: Self-pay | Admitting: Hematology

## 2017-11-14 VITALS — BP 124/41 | HR 57 | Temp 98.2°F | Resp 17 | Ht 62.0 in | Wt 180.5 lb

## 2017-11-14 DIAGNOSIS — Z7982 Long term (current) use of aspirin: Secondary | ICD-10-CM

## 2017-11-14 DIAGNOSIS — M549 Dorsalgia, unspecified: Secondary | ICD-10-CM | POA: Insufficient documentation

## 2017-11-14 DIAGNOSIS — R188 Other ascites: Secondary | ICD-10-CM | POA: Insufficient documentation

## 2017-11-14 DIAGNOSIS — R140 Abdominal distension (gaseous): Secondary | ICD-10-CM | POA: Insufficient documentation

## 2017-11-14 DIAGNOSIS — J441 Chronic obstructive pulmonary disease with (acute) exacerbation: Secondary | ICD-10-CM | POA: Insufficient documentation

## 2017-11-14 DIAGNOSIS — Z79899 Other long term (current) drug therapy: Secondary | ICD-10-CM | POA: Insufficient documentation

## 2017-11-14 DIAGNOSIS — Z87891 Personal history of nicotine dependence: Secondary | ICD-10-CM | POA: Insufficient documentation

## 2017-11-14 DIAGNOSIS — C220 Liver cell carcinoma: Secondary | ICD-10-CM | POA: Insufficient documentation

## 2017-11-14 DIAGNOSIS — N183 Chronic kidney disease, stage 3 (moderate): Secondary | ICD-10-CM

## 2017-11-14 DIAGNOSIS — Z800 Family history of malignant neoplasm of digestive organs: Secondary | ICD-10-CM

## 2017-11-14 DIAGNOSIS — D61818 Other pancytopenia: Secondary | ICD-10-CM | POA: Diagnosis not present

## 2017-11-14 DIAGNOSIS — E1122 Type 2 diabetes mellitus with diabetic chronic kidney disease: Secondary | ICD-10-CM | POA: Diagnosis not present

## 2017-11-14 DIAGNOSIS — K7460 Unspecified cirrhosis of liver: Secondary | ICD-10-CM | POA: Insufficient documentation

## 2017-11-14 DIAGNOSIS — R635 Abnormal weight gain: Secondary | ICD-10-CM

## 2017-11-14 DIAGNOSIS — Z9981 Dependence on supplemental oxygen: Secondary | ICD-10-CM

## 2017-11-14 DIAGNOSIS — R160 Hepatomegaly, not elsewhere classified: Secondary | ICD-10-CM

## 2017-11-14 LAB — COMPREHENSIVE METABOLIC PANEL
ALT: 14 U/L (ref 0–55)
ANION GAP: 8 (ref 3–11)
AST: 22 U/L (ref 5–34)
Albumin: 2.6 g/dL — ABNORMAL LOW (ref 3.5–5.0)
Alkaline Phosphatase: 141 U/L (ref 40–150)
BUN: 27 mg/dL — ABNORMAL HIGH (ref 7–26)
CHLORIDE: 104 mmol/L (ref 98–109)
CO2: 28 mmol/L (ref 22–29)
Calcium: 8.7 mg/dL (ref 8.4–10.4)
Creatinine, Ser: 1.36 mg/dL — ABNORMAL HIGH (ref 0.60–1.10)
GFR, EST AFRICAN AMERICAN: 44 mL/min — AB (ref >60)
GFR, EST NON AFRICAN AMERICAN: 38 mL/min — AB (ref >60)
Glucose, Bld: 124 mg/dL (ref 70–140)
POTASSIUM: 4.1 mmol/L (ref 3.5–5.1)
SODIUM: 140 mmol/L (ref 136–145)
Total Bilirubin: 0.9 mg/dL (ref 0.2–1.2)
Total Protein: 5.6 g/dL — ABNORMAL LOW (ref 6.4–8.3)

## 2017-11-14 LAB — CBC WITH DIFFERENTIAL (CANCER CENTER ONLY)
BASOS ABS: 0 10*3/uL (ref 0.0–0.1)
BASOS PCT: 0 %
EOS ABS: 0.2 10*3/uL (ref 0.0–0.5)
Eosinophils Relative: 6 %
HEMATOCRIT: 28.8 % — AB (ref 34.8–46.6)
Hemoglobin: 8.8 g/dL — ABNORMAL LOW (ref 11.6–15.9)
Lymphocytes Relative: 19 %
Lymphs Abs: 0.6 10*3/uL — ABNORMAL LOW (ref 0.9–3.3)
MCH: 32.2 pg (ref 25.1–34.0)
MCHC: 30.6 g/dL — AB (ref 31.5–36.0)
MCV: 105.5 fL — ABNORMAL HIGH (ref 79.5–101.0)
MONO ABS: 0.2 10*3/uL (ref 0.1–0.9)
Monocytes Relative: 5 %
NEUTROS ABS: 2.3 10*3/uL (ref 1.5–6.5)
Neutrophils Relative %: 70 %
PLATELETS: 85 10*3/uL — AB (ref 145–400)
RBC: 2.73 MIL/uL — ABNORMAL LOW (ref 3.70–5.45)
RDW: 16.8 % — AB (ref 11.2–14.5)
WBC Count: 3.3 10*3/uL — ABNORMAL LOW (ref 3.9–10.3)

## 2017-11-14 LAB — PROTIME-INR
INR: 1.11
PROTHROMBIN TIME: 14.2 s (ref 11.4–15.2)

## 2017-11-14 NOTE — Telephone Encounter (Signed)
Scheduled appt per 4/11 los - Gave patient AVS and calender per los. Central radiology to contact patient for Paracentesis once order is placed.

## 2017-11-14 NOTE — Progress Notes (Signed)
[place]  [phone] [phone]  Clinic Follow Up Note   Patient Care Team: Unk Pinto, MD as PCP - General (Internal Medicine) Inda Castle, MD (Inactive) as Consulting Physician (Gastroenterology) Izora Gala, MD as Consulting Physician (Otolaryngology)   Date of Service:  11/14/2017   CHIEF COMPLAINTS:  Follow up [facility]   Oncology History   Cancer Staging Hepatocellular carcinoma [facility]) Staging form: Liver, AJCC 8th Edition - Clinical stage from 07/18/2017: Stage II (cT2, cN0, cM0) - Signed by Truitt Merle, MD on 11/14/2017       Hepatocellular carcinoma ([place])   06/11/2017 Imaging    US Abdomen Limited RUQ 06/11/17  IMPRESSION: 1. No evidence of ascites. 2. Cirrhosis with a new left liver lobe hyperechoic lesion. This is suspicious for hepatocellular carcinoma. Recommend further evaluation with dedicated pre and post contrast abdominal MRI. These results will be called to the ordering clinician or representative by the Radiologist Assistant, and communication documented in the PACS or zVision Dashboard       06/25/2017 Imaging    MRI Liver W WO Contrast 06/25/17  IMPRESSION: 1. Two early arterial phase enhancing liver lesions consistent with hepatocellular carcinomas, 18.5 x 15.5 lesion in segment 2 and 24 x 23 mm lesion and segment 6 2. Advanced cirrhotic changes involving the liver as described above. There is portal venous hypertension, portal venous collaterals, esophageal varices, splenomegaly and ascites. 3. Stable appearing gastrohepatic ligament, periportal and celiac axis lymph nodes      07/03/2017 Imaging    CT Chest W Contrast 07/03/17  IMPRESSION: 1. No specific findings identified to suggest metastatic disease to the chest. 2. There are a few tiny less than 5 mm pulmonary nodules within both lungs. Nonspecific. 3. Prior granulomatous disease 4. Small pleural effusions 5. Aortic Atherosclerosis  (ICD10-I70.0). LAD coronary artery calcifications. 6. Cirrhosis with stigmata of portal venous hypertension.      07/18/2017 Initial Biopsy     Diagnosis 07/18/18 Liver, needle/core biopsy, left lobe - HEPATOCELLULAR CARCINOMA. Microscopic Comment Dr. Lyndon Code has reviewed the case.      07/18/2017 Cancer Staging    Staging form: Liver, AJCC 8th Edition - Clinical stage from 07/18/2017: Stage II (cT2, cN0, cM0) - Signed by Truitt Merle, MD on 11/14/2017      08/30/2017 Initial Diagnosis    Hepatocellular carcinoma ([place])      08/30/2017 Surgery     CT MICROWAVE THERMAL ABLATION by Dr. Vernard Gambles 08/30/17       HISTORY OF PRESENTING ILLNESS: 07/10/17  [name] 72 y.o. female is here because of liver lesions suspicious for [facility]. She was referred by GI Dr. Havery Moros. She presents to the clinic today accompanied by her husband.    She was diagnosed with liver cirrhosis for 3-4 years. 2 years ago she had low potassium and elevated ammonia levels, she was admitted and treated for hepatic encephalopathy.  She was put on lactulose to clear the ammonia. The lactulose caused diarrhea so she now takes rifampin. She is not sure what the cause of cirrhosis is. Dr. Havery Moros believes it is related to her fatty liver.  On a routine surveillance abdominal ultrasound on June 11, 2017, she was found to have a new left lobe liver lesion, suspicious for hepatocellular carcinoma.  He underwent MRI of liver with and without contrast on June 25, 2017, which showed 2 early arterial phase enhancing liver lesions consistent with hepatocellular carcinoma, 2.4 x 2.3 cm lesion in segment 6 and a 1.8  x 1.5 cm lesion in segment 2.  She was referred to [place] for further evaluation.  She has borderline DM. She denies any heart issues. She has had several surgeries in the past. Her father passed from lung cancer due to smoking. Her material uncle possibly had colon cancer. Her paternal cousin had liver cancer.  another cousin had metastatic lung cancer. She stopped smoking 3 years ago before smoking for several years. She rarely drinks. Her legs become swelling occasionally and takes lasix to help. Fluid will build up in her system but has never required drainage. Her last upper endoscopy was in 2015 and her last colonoscopy was in 2009. Her 05/2017 mammogram was normal.   Today she notes she had episodes of confusion in the past which brought about work up. Results showed elevated ammonia levels caused by liver cirrhosis. Her PCP is Dr. Melford Aase. She notes a recurrence nodule on her right side of neck. It was previously removed and it was not cancerous. She would like to be around in the summer for when her great grandchild will be born. When she sits up real fast she will get dizzy. She was previously treated for vertigo but her medication was stopped. She is able to lay flat.  She was diagnosed with COPD for 3 years and has been on oxygen for 3 years. She is able to ambulate with walker for short distance due to her ongoing back pain. She can do ADL with some assistance.    INTERVAL HISTORY  [name] is here for a follow up post liver ablation. In interim she underwent CT microwave ablation on 08/30/17. She was hospitalized twice in February 2019 for Dyspnea and COPD exacerbation. She had ascites and pleural effusion so she had Paracentesis and Thoracentesis on 09/22/17.   She presents to the clinic today accompanied by her family member. She notes her liver ablation went well. She was seen by Dr. Vernard Gambles last week who was confident everything went well. She should have a scan in the next month. Her BP is 124/41 today. She takes oral iron once daily. Her Hg is 8.8 today.   On review of symptoms, pt lately she has gained some weight, 10 pounds over a month. Family member is concerned of more fluid retention and edema. She notes she has been having SOB. She is on oxygen [place] still for her COPD. She has  abdominal bloating with some diffuse tenderness.    MEDICAL HISTORY:  Past Medical History:  Diagnosis Date  . Asthma   . Cancer [facility])    liver cancer   . COPD (chronic obstructive pulmonary disease) ([place])    x 3 years   . Depression   . Diabetes mellitus without complication ([place])    diet controlled  not on meds   . Edema    left leg   . Esophageal varices ([place])   . Fibromyalgia   . Gastritis   . GERD (gastroesophageal reflux disease)   . Hepatic cirrhosis ([place])   . Hepatic encephalopathy ([place])   . History of blood transfusion   . Hyperlipidemia   . Hypertension   . IBS (irritable bowel syndrome)   . Phlebitis    left leg x 2   . Pneumonia    hx of   . Splenomegaly   . Vitamin D deficiency     SURGICAL HISTORY: Past Surgical History:  Procedure Laterality Date  . ABDOMINAL HYSTERECTOMY    . BACK SURGERY    .  CATARACT EXTRACTION, BILATERAL    . CHOLECYSTECTOMY    . ESOPHAGOGASTRODUODENOSCOPY  07/16/2012   Procedure: ESOPHAGOGASTRODUODENOSCOPY (EGD);  Surgeon: Inda Castle, MD;  Location: [facility];  Service: Endoscopy;  Laterality: N/A;  . GASTRIC VARICES BANDING  07/16/2012   Procedure: GASTRIC VARICES BANDING;  Surgeon: Inda Castle, MD;  Location: [facility] ENDOSCOPY;  Service: Endoscopy;  Laterality: N/A;  . HIP ARTHROPLASTY Bilateral   . IR GENERIC HISTORICAL  06/25/2016   IR RADIOLOGIST EVAL & MGMT 06/25/2016 [facility]INTER[facility]  . IR GENERIC HISTORICAL  06/27/2016   IR KYPHO LUMBAR INC FX REDUCE BONE BX UNI/BIL CANNULATION INC/IMAGING 06/27/2016 Luanne Bras, MD [facility]INTER[facility]  . IR GENERIC HISTORICAL  07/17/2016   IR RADIOLOGIST EVAL & MGMT 07/17/2016 [facility]  . IR RADIOLOGIST EVAL & MGMT  07/10/2017  . IR RADIOLOGIST EVAL & MGMT  10/01/2017  . IR THORACENTESIS ASP PLEURAL SPACE W/IMG GUIDE  09/12/2017  . IR THORACENTESIS ASP PLEURAL SPACE W/IMG GUIDE  09/17/2017  . NECK SURGERY    . RADIOFREQUENCY ABLATION N/A 08/30/2017   Procedure: CT MICROWAVE  THERMAL ABLATION;  Surgeon: Arne Cleveland, MD;  Location: [facility] ORS;  Service: Anesthesiology;  Laterality: N/A;  . SHOULDER SURGERY Right     SOCIAL HISTORY: Social History   Socioeconomic History  . Marital status: Married    Spouse name: [name]  . Number of children: 5  . Years of education: Not on file  . Highest education level: Not on file  Occupational History  . Occupation: Arboriculturist: UNEMPLOYED  Social Needs  . Financial resource strain: Not on file  . Food insecurity:    Worry: Not on file    Inability: Not on file  . Transportation needs:    Medical: Not on file    Non-medical: Not on file  Tobacco Use  . Smoking status: Former Smoker    Packs/day: 1.00    Years: 50.00    Pack years: 50.00    Types: Cigarettes    Last attempt to quit: 11/05/2014    Years since quitting: 3.0  . Smokeless tobacco: Never Used  . Tobacco comment: Quit April 2016  Substance and Sexual Activity  . Alcohol use: No    Alcohol/week: 0.0 oz  . Drug use: No  . Sexual activity: Not on file  Lifestyle  . Physical activity:    Days per week: Not on file    Minutes per session: Not on file  . Stress: Not on file  Relationships  . Social connections:    Talks on phone: Not on file    Gets together: Not on file    Attends religious service: Not on file    Active member of club or organization: Not on file    Attends meetings of clubs or organizations: Not on file    Relationship status: Not on file  . Intimate partner violence:    Fear of current or ex partner: Not on file    Emotionally abused: Not on file    Physically abused: Not on file    Forced sexual activity: Not on file  Other Topics Concern  . Not on file  Social History Narrative  . Not on file    FAMILY HISTORY: Family History  Problem Relation Age of Onset  . Diabetes Brother        deceased  . Heart disease Sister        A Fib  . Heart disease Mother  CHF  . Atrial fibrillation Sister   .  Cancer Father        lung cancer   . Cancer Maternal Uncle        unknown type cancer   . Cancer Other        lung cancer   . Colon cancer Neg Hx     ALLERGIES:  is allergic to atorvastatin; diphenhydramine hcl (sleep); hydrocodone-acetaminophen; lopid [gemfibrozil]; loratadine; lorazepam; simvastatin; sulfamethoxazole; and sulfonamide derivatives.  MEDICATIONS:  Current Outpatient Medications  Medication Sig Dispense Refill  . albuterol (PROVENTIL HFA;VENTOLIN HFA) 108 (90 Base) MCG/ACT inhaler Inhale 2 puffs into the lungs every 6 (six) hours as needed for wheezing or shortness of breath. 1 Inhaler 2  . allopurinol (ZYLOPRIM) 300 MG tablet take 1 tablet (300 mg) by mouth daily with supper - for gout 30 tablet 0  . ALPRAZolam (XANAX) 0.25 MG tablet Take 1 tab as needed for anxiety, cramping, air hunger every 8 hours as needed. 90 tablet 0  . aspirin EC 81 MG tablet Take 81 mg by mouth at bedtime.    . bisoprolol (ZEBETA) 10 MG tablet Take 1 tablet (10 mg total) by mouth daily. 90 tablet 0  . budesonide (PULMICORT) 0.25 MG/2ML nebulizer solution Take 2 mLs (0.25 mg total) by nebulization 2 (two) times daily. 60 mL 12  . Cholecalciferol (VITAMIN D3) 5000 units CAPS Take 5,000 Units by mouth daily with breakfast.    . citalopram (CELEXA) 20 MG tablet Take 1 tablet (20 mg total) by mouth daily with supper. 30 tablet 0  . cyclobenzaprine (FLEXERIL) 10 MG tablet Take 1 tablet (10 mg total) by mouth 3 (three) times daily as needed for muscle spasms. 90 tablet 2  . doxycycline (VIBRAMYCIN) 100 MG capsule Take 1 capsule twice daily with food 20 capsule 0  . ferrous sulfate 325 (65 FE) MG tablet Take 325 mg by mouth daily.     Marland Kitchen gabapentin (NEURONTIN) 400 MG capsule Take 1 capsule (400 mg total) by mouth 3 (three) times daily. 90 capsule 0  . glucose blood test strip Take sugars once daily 50 each 12  . ipratropium (ATROVENT) 0.02 % nebulizer solution use 1 vial in nebulizer EVERY 4 HOURS AS NEEDED  FOR WHEEZING OR SHORTNESS OF BREATH 75/15=5 75 mL 3  . lactulose (CHRONULAC) 10 GM/15ML solution take 74ms TWICE DAILY (Patient taking differently: Take 10 g by mouth daily. ) 2700 mL 2  . Magnesium 250 MG TABS Take 250 mg by mouth at bedtime.     . NONFORMULARY OR COMPOUNDED ITEM Apply 1 application topically See admin instructions. SALICYLIC ACID ACID (PETROLATUM) 50% OINTMENT-APPLY TO AFFECTED AREA AT BEDTIME PRN DRY SKIN/LEGS ([place]ounded at [place]    . nystatin cream (MYCOSTATIN) Apply 1 application topically 2 (two) times daily. (Patient taking differently: Apply 1 application topically 2 (two) times daily as needed for dry skin (itching). ) 30 g 1  . ondansetron (ZOFRAN) 4 MG tablet Take 1 tablet (4 mg total) by mouth every 6 (six) hours as needed for nausea or vomiting. 30 tablet 1  . OXYGEN Inhale 2-3 L into the lungs continuous. 3 L with exertion    . pantoprazole (PROTONIX) 40 MG tablet Take 1 tablet (40 mg total) by mouth daily. (Patient taking differently: Take 40 mg by mouth daily with supper. ) 90 tablet 3  . potassium chloride SA (K-DUR,KLOR-CON) 20 MEQ tablet Take 1 tablet (20 mEq total) by mouth 2 (two) times daily. [place]  tablet 0  . rifampin (RIFADIN) 300 MG capsule Take 1 capsule TWICE DAILY day for Liver 180 capsule 1  . spironolactone (ALDACTONE) 25 MG tablet TAKE 1 TABLET BY MOUTH THREE TIMES DAILY FOR FLUIDS AND SWELLING 90 tablet 1  . spironolactone (ALDACTONE) 50 MG tablet Take 3 tablets (150 mg total) by mouth daily. 90 tablet 0  . terconazole (TERAZOL 7) 0.4 % vaginal cream Place 1 applicator vaginally at bedtime. (Patient taking differently: Place 1 applicator vaginally at bedtime as needed (itching). ) 45 g 2  . torsemide (DEMADEX) 10 MG tablet Take 3 tablets (30 mg total) by mouth every morning. 90 tablet 2  . torsemide (DEMADEX) 20 MG tablet Take 3 tablets (60 mg total) by mouth every evening. 90 tablet 2  . traMADol (ULTRAM) 50 MG tablet Take 50 mg by mouth  every 12 hours as needed for pain for chronic pain, try to limit amount 60 tablet 0  . triamcinolone cream (KENALOG) 0.1 % Apply 1 application topically 3 (three) times daily as needed for pain (Patient taking differently: Apply 1 application topically 3 (three) times daily as needed (itching). ) 80 g 0   No current facility-administered medications for this visit.     REVIEW OF SYSTEMS:   Constitutional: Denies fevers, chills or abnormal night sweats (+) weight gain (+) fatigue  Eyes: Denies blurriness of vision, double vision or watery eyes Ears, nose, mouth, throat, and face: Denies mucositis or sore throat   Respiratory: Denies cough (+) on oxygen due to COPD (+) Occasional SOB Cardiovascular: Denies palpitation, chest discomfort (+) lower extremity edema Gastrointestinal:  Denies nausea, heartburn or change in bowel habits (+) abdominal bloating and tenderness Skin: Denies abnormal skin rashes  MSK: (+) Chronic back pain  Lymphatics: Denies new lymphadenopathy or easy bruising Neurological:Denies numbness, tingling or new weaknesses  Behavioral/Psych: Mood is stable, no new changes  All other systems were reviewed with the patient and are negative.  PHYSICAL EXAMINATION: ECOG PERFORMANCE STATUS: 3 - Symptomatic, >50% confined to bed  Vitals:   11/14/17 1557  BP: (!) 124/41  Pulse: (!) 57  Resp: 17  Temp: 98.2 F (36.8 C)  SpO2: 96%   Filed Weights   11/14/17 1557  Weight: 180 lb 8 oz (81.9 kg)    GENERAL:alert, no distress and comfortable SKIN: skin color, texture, turgor are normal, no rashes or significant lesions EYES: normal, conjunctiva are pink and non-injected, sclera clear OROPHARYNX:no exudate, no erythema and lips, buccal mucosa, and tongue normal  NECK: supple, thyroid normal size, non-tender, without nodularity LYMPH:  no palpable lymphadenopathy in the cervical, axillary or inguinal LUNGS: clear to auscultation and percussion with normal breathing  effort HEART: regular rate & rhythm and no murmurs and no lower extremity edema ABDOMEN:abdomen soft and normal bowel sounds, very active bowel sounds (+) ascites with mild abdominal tenderness Musculoskeletal:no cyanosis of digits and no clubbing  PSYCH: alert & oriented x 3 with fluent speech NEURO: no focal motor/sensory deficits  LABORATORY DATA:  I have reviewed the data as listed CBC Latest Ref Rng & Units 11/14/2017 10/18/2017 10/03/2017  WBC 3.9 - 10.3 K/uL 3.3(L) 4.1 3.8  Hemoglobin 12.0 - 15.0 g/dL - 9.7(L) 8.5(L)  Hematocrit 34.8 - 46.6 % 28.8(L) 29.2(L) 26.1(L)  Platelets 145 - 400 K/uL 85(L) 98.0(L) 89(L)    CMP Latest Ref Rng & Units 11/14/2017 10/18/2017 10/03/2017  Glucose 70 - 140 mg/dL 124 111(H) 82  BUN 7 - 26 mg/dL 27(H) 20 16  Creatinine  0.60 - 1.10 mg/dL 1.36(H) 1.20 1.41(H)  Sodium 136 - 145 mmol/L 140 135 137  Potassium 3.5 - 5.1 mmol/L 4.1 4.3 4.0  Chloride 98 - 109 mmol/L 104 96 94(L)  CO2 22 - 29 mmol/L 28 35(H) 36(H)  Calcium 8.4 - 10.4 mg/dL 8.7 9.2 9.0  Total Protein 6.4 - 8.3 g/dL 5.6(L) 5.7(L) 5.5(L)  Total Bilirubin 0.2 - 1.2 mg/dL 0.9 1.2 1.7(H)  Alkaline Phos 40 - 150 U/L 141 144(H) -  AST 5 - 34 U/L 22 28 29   ALT 0 - 55 U/L 14 23 19    PATHOLOGY   Diagnosis 07/18/18 Liver, needle/core biopsy, left lobe - HEPATOCELLULAR CARCINOMA. Microscopic Comment Dr. Lyndon Code has reviewed the case.   RADIOGRAPHIC STUDIES: I have personally reviewed the radiological images as listed and agreed with the findings in the report. No results found.  CT Chest W Contrast 07/03/17  IMPRESSION: 1. No specific findings identified to suggest metastatic disease to the chest. 2. There are a few tiny less than 5 mm pulmonary nodules within both lungs. Nonspecific. 3. Prior granulomatous disease 4. Small pleural effusions 5. Aortic Atherosclerosis (ICD10-I70.0). LAD coronary artery calcifications. 6. Cirrhosis with stigmata of portal venous hypertension.    MRI  Liver W WO Contrast 06/25/17  IMPRESSION: 1. Two early arterial phase enhancing liver lesions consistent with hepatocellular carcinomas, 18.5 x 15.5 lesion in segment 2 and 24 x 23 mm lesion and segment 6 2. Advanced cirrhotic changes involving the liver as described above. There is portal venous hypertension, portal venous collaterals, esophageal varices, splenomegaly and ascites. 3. Stable appearing gastrohepatic ligament, periportal and celiac axis lymph nodes   US Abdomen Limited RUQ 06/11/17  IMPRESSION: 1. No evidence of ascites. 2. Cirrhosis with a new left liver lobe hyperechoic lesion. This is suspicious for hepatocellular carcinoma. Recommend further evaluation with dedicated pre and post contrast abdominal MRI. These results will be called to the ordering clinician or representative by the Radiologist Assistant, and communication documented in the PACS or zVision Dashboard   Diagnostic Mammogram Bilateral 06/05/17  IMPRESSION: Negative exam.  No evidence of malignancy. RECOMMENDATION: Screening mammogram in one year.   ASSESSMENT & PLAN:  [name] is a 72 y.o. caucasian female with a history of COPD (oxygen dependent), HTN, fibromyalgia, CKD, liver cirrhosis secondary to NASH, HLD, IBS, presented with screening discovered two liver masses.   1.  Hepatocellular carcinoma, multifocal (2), cT2N0M0, stage II -I previously reviewed and discussed her image findings.  She has long-standing history of liver cirrhosis, at high risk for hepatocellular carcinoma.  Her ultrasound and MRI showed 2 liver lesions with arterial enhancement, and venous washout, typical for hepatocellular carcinoma. The largest lesion is 2.4 cm.  -However her AFP was normal.we discussed this is likely hepatocellular carcinoma, also metastatic liver lesion cannot be ruled out completely.   -Case was discussed in our GI tumor board a few weeks ago, the consensus was ultrasound-guided liver biopsy  to confirm a tissue diagnosis.  -Her 07/18/18 Liver biopsy was confirmed as liver cancer, she is not eligible for surgery or transplant given her comorbidities, especially oxygen dependent COPD. Case was discussed in tumor board, Dr. Barry Dienes concur she is not a candidate for surgery.  -Staging Images are negative for distant metastasis -She underwent CT guided microwave thermal ablation by Dr. Vernard Gambles on 08/30/17, tolerated procedure well. -She will get an abdominal MRI within the next month with Dr. Vernard Gambles to follow up on her surgical response.  She will follow-up with  Dr. Vernard Gambles closely. -I reviewed her labs, her anemia, thrombocytopenia and leukopenia are related to her liver cirrhosis. Her kidney function tests are elevated likely due to her Lasix use. BP at 124/41. Continue to closely monitor for needed adjustment.  -I advised her to continue with monthly labs with PCP or Dr. Havery Moros as she continues to be closely followed by them.  -I will see her in 1 year or sooner if needed.    2. Liver Cirrhosis, Secondary to NASH, with portal hypertension, esophageal varices, hepatic encephalopathy, ascites, child Pugh score 10 (class C) -f/u with Dr. Enis Gash  -After liver ablation she developed ascites and pleural effusion, s/p paracentesis and thoracentesis on 09/22/17.  -She has gained 10 pounds over the last month from fluid retention. -She was given Lasix to reduce her fluid, but is not removing fluid fast enough.  -Upon exam today (11/14/17), there is evidence of ascites, no suspicion for pleural effusion.  -I offered to set up paracentesis within the next week, pt agreed.  -Her liver cirrhosis overall has gotten worse lately, with more complications, I encouraged her to follow-up with Dr. breast closely   3. COPD, on oxygen, DM, stage III CKD  -Follow-up with PCP  4. Anemia of chronic disease and pancytopenia  -secondary to liver cirrhosis and splenomegaly -Hospital blood tests in 09/2017  show no nutritional anemia.  -HG at 8.8 and plt at [place] today (11/14/17), no need for blood transfusion.  -If Hg <8 she would need blood transfusion. Will monitor closely with monthly labs.    PLAN:  -[facility]acentesis at [facility] in one week  -Lab and f/u in one year  -She will follow-up with Dr. Vernard Gambles closely   Orders Placed This Encounter  Procedures  . US Paracentesis    Standing Status:   Future    Standing Expiration Date:   11/14/2018    Order Specific Question:   If therapeutic, is there a maximum amount of fluid to be removed?    Answer:   Yes    Order Specific Question:   What is the maximum amount of fluide to be removed?    Answer:   4L    Order Specific Question:   Are labs required for specimen collection?    Answer:   No    Order Specific Question:   Is Albumin medication needed?    Answer:   No    Order Specific Question:   Reason for Exam (SYMPTOM  OR DIAGNOSIS REQUIRED)    Answer:   symptom relief    Order Specific Question:   Preferred imaging location?    Answer:   [facility]    All questions were answered. The patient knows to call the clinic with any problems, questions or concerns. I spent 20 minutes counseling the patient face to face. The total time spent in the appointment was 25 minutes and more than 50% was on counseling.     Truitt Merle, MD 11/14/2017   This document serves as a record of services personally performed by Truitt Merle, MD. It was created on her behalf by Joslyn Devon, a trained medical scribe. The creation of this record is based on the scribe's personal observations and the provider's statements to them.   I have reviewed the above documentation for accuracy and completeness, and I agree with the above.

## 2017-11-14 NOTE — Telephone Encounter (Signed)
Pt hs been informed of : Please restrict sugar/salt Increase toresmide to 4 in the morning or 40 mg until weight is back down 5 lbs and then resume your regular dose Continue the 60 mg at night or same dose of 3 of the 62m  Call the office over the weekend if any questions and get the answering service.  Call the office on Monday to let [place] weight.     Pt' husband/ caregiver  voiced understanding & hung up. April 11th 2019 by DD

## 2017-11-14 NOTE — Telephone Encounter (Signed)
Please restrict sugar/salt Increase toresmide to 4 in the morning or 40 mg until weight is back down 5 lbs and then resume your regular dose Continue the 60 mg at night or same dose of 3 of the 5m  Call the office over the weekend if any questions and get the answering service.  Call the office on Monday to let [place] her weight.

## 2017-11-15 ENCOUNTER — Telehealth: Payer: Self-pay | Admitting: Gastroenterology

## 2017-11-15 LAB — AFP TUMOR MARKER: AFP, SERUM, TUMOR MARKER: 3.1 ng/mL (ref 0.0–8.3)

## 2017-11-15 NOTE — Telephone Encounter (Signed)
Okay, I agree with that plan. We should repeat a BMET in 10-14 days if you can ask her to go to the lab at that time. Thanks

## 2017-11-15 NOTE — Telephone Encounter (Signed)
I spoke with the patient's husband, [name].  According to him she takes Torsemide 30m tablets, 3 in the AM and Torsemide 216mtablets, 3 in the PM.  She also takes Aldactone 2575mID.  Also, she had a 5# weight gain from 11/09/17 to 11/15/17 and Dr FenBurr Medicold him to increase her morning Torsemide dose by 49m93mr 4-5 days or until weight was back down.

## 2017-11-15 NOTE — Telephone Encounter (Signed)
Husband, [name] was notified of the change in timeframe for lab work.

## 2017-11-15 NOTE — Telephone Encounter (Signed)
Can you help clarify what dosing of diuretics the patient is on? She has had these adjusted for worsening ascites. Got a message from Dr. Burr Medico about her case. Would like to clarify diuretic dose and if that is a change from what we recently saw she was on at clinic visit. Would repeat BMET in 2 weeks and she is seeing me in a few weeks. Thanks

## 2017-11-18 ENCOUNTER — Other Ambulatory Visit: Payer: Self-pay | Admitting: Physician Assistant

## 2017-11-18 ENCOUNTER — Other Ambulatory Visit: Payer: Self-pay | Admitting: *Deleted

## 2017-11-18 ENCOUNTER — Other Ambulatory Visit: Payer: Self-pay | Admitting: Hematology

## 2017-11-18 DIAGNOSIS — G8929 Other chronic pain: Secondary | ICD-10-CM

## 2017-11-18 DIAGNOSIS — R188 Other ascites: Secondary | ICD-10-CM

## 2017-11-18 MED ORDER — TRAMADOL HCL 50 MG PO TABS: ORAL_TABLET | ORAL | 0 refills | Status: DC

## 2017-11-18 MED ORDER — POTASSIUM CHLORIDE ER 10 MEQ PO TBCR: EXTENDED_RELEASE_TABLET | ORAL | 2 refills | Status: DC

## 2017-11-18 NOTE — Progress Notes (Signed)
has COLONIC POLYPS; Anemia; Esophagitis; Diverticulosis of large intestine; Esophageal varices ([place]); Essential hypertension; Hyperlipidemia; GERD; Vitamin D deficiency; IBS; Fibromyalgia; Goals of care, counseling/discussion; Venous insufficiency; Thrombocytopenia ([place]); Hepatic encephalopathy ([place]); Hypokalemia; Chronic respiratory failure ([place]); Atrial tachycardia, multifocal ([place]); Obesity (BMI 30-39.9); Hyperammonemia ([place]); Anasarca; Hepatocellular carcinoma ([place]); Ascites; Obesity hypoventilation syndrome ([place]); Recurrent right pleural effusion; NASH (nonalcoholic steatohepatitis); Liver cirrhosis secondary to nonalcoholic steatohepatitis (NASH) ([place]); Chronic diastolic CHF (congestive heart failure) ([place]); Macrocytic anemia; and CKD (chronic kidney disease), stage III ([place]) on their problem list.

## 2017-11-20 ENCOUNTER — Other Ambulatory Visit: Payer: Self-pay | Admitting: Hematology

## 2017-11-20 ENCOUNTER — Ambulatory Visit (HOSPITAL_COMMUNITY)
Admission: RE | Admit: 2017-11-20 | Discharge: 2017-11-20 | Disposition: A | Discharge status: Home / Self Care | Payer: PPO | Source: Ambulatory Visit | Attending: Hematology | Admitting: Hematology

## 2017-11-20 DIAGNOSIS — M7989 Other specified soft tissue disorders: Secondary | ICD-10-CM | POA: Diagnosis not present

## 2017-11-20 DIAGNOSIS — R188 Other ascites: Secondary | ICD-10-CM

## 2017-11-20 DIAGNOSIS — Z8719 Personal history of other diseases of the digestive system: Secondary | ICD-10-CM | POA: Insufficient documentation

## 2017-11-20 DIAGNOSIS — R140 Abdominal distension (gaseous): Secondary | ICD-10-CM | POA: Diagnosis not present

## 2017-11-20 MED ORDER — LIDOCAINE HCL (PF) 2 % IJ SOLN
INTRAMUSCULAR | Status: AC
  Filled 2017-11-20: qty 10

## 2017-11-20 MED ORDER — LIDOCAINE HCL (PF) 2 % IJ SOLN
INTRAMUSCULAR | Status: AC
  Filled 2017-11-20: qty 20

## 2017-11-20 NOTE — Progress Notes (Signed)
Patient presented to radiology department for possible paracentesis.  Patient states she feels "tight" and has had increased swelling in her legs.  Limited US Abdomen performed which shows no ascites present.  No procedure performed today.  Patient informed of findings.   Brynda Greathouse, MS RD PA-C 9:57 AM

## 2017-11-21 DIAGNOSIS — M25552 Pain in left hip: Secondary | ICD-10-CM | POA: Diagnosis not present

## 2017-11-25 ENCOUNTER — Ambulatory Visit: Payer: PPO | Admitting: Internal Medicine

## 2017-11-25 ENCOUNTER — Encounter: Payer: Self-pay | Admitting: Internal Medicine

## 2017-11-25 VITALS — BP 142/76 | HR 72 | Temp 97.5°F | Resp 18 | Ht 62.0 in | Wt 179.0 lb

## 2017-11-25 DIAGNOSIS — L03115 Cellulitis of right lower limb: Secondary | ICD-10-CM | POA: Diagnosis not present

## 2017-11-25 DIAGNOSIS — M79605 Pain in left leg: Secondary | ICD-10-CM | POA: Diagnosis not present

## 2017-11-25 DIAGNOSIS — M79604 Pain in right leg: Secondary | ICD-10-CM

## 2017-11-25 DIAGNOSIS — J449 Chronic obstructive pulmonary disease, unspecified: Secondary | ICD-10-CM | POA: Diagnosis not present

## 2017-11-25 DIAGNOSIS — I872 Venous insufficiency (chronic) (peripheral): Secondary | ICD-10-CM | POA: Diagnosis not present

## 2017-11-25 DIAGNOSIS — Z79899 Other long term (current) drug therapy: Secondary | ICD-10-CM | POA: Diagnosis not present

## 2017-11-25 DIAGNOSIS — M7989 Other specified soft tissue disorders: Secondary | ICD-10-CM | POA: Diagnosis not present

## 2017-11-25 DIAGNOSIS — J441 Chronic obstructive pulmonary disease with (acute) exacerbation: Secondary | ICD-10-CM | POA: Diagnosis not present

## 2017-11-25 DIAGNOSIS — I10 Essential (primary) hypertension: Secondary | ICD-10-CM

## 2017-11-25 DIAGNOSIS — J9610 Chronic respiratory failure, unspecified whether with hypoxia or hypercapnia: Secondary | ICD-10-CM | POA: Diagnosis not present

## 2017-11-25 MED ORDER — CEPHALEXIN 500 MG PO CAPS: 500.0000 mg | ORAL_CAPSULE | Freq: Four times a day (QID) | ORAL | 0 refills | Status: AC

## 2017-11-25 MED ORDER — TORSEMIDE 20 MG PO TABS: ORAL_TABLET | ORAL | 5 refills | Status: DC

## 2017-11-25 NOTE — Progress Notes (Signed)
Subjective:    Patient ID: [name], female    DOB: 1945/12/20, 72 y.o.   MRN: [id number]  HPI    Patient is a 72 yo MWF with multiple end stage co-morbidities including O2 Dependent COPD, HTN, NASH w/Cirrhosis and portal HTN w/ hx/o Hepatic Encephalopathy and now Recent RFA of #2 hepatomas in Jan 2019. She is brought now by husband for worsening bilat LE swelling & pain. .   Medication Sig  . Albuterol  HFA inhaler 2 puffs every 6  hours as needed  . allopurinol  300 MG  take 1 tablet (300 mg) by mouth daily with supper - for gout  . ALPRAZolam 0.25 MG Take 1 tab as needed for anxiety  . aspirin EC 81 MG Take 81 mg by mouth at bedtime.  . bisoprolol 10 MG  Take 1 tablet (10 mg total) by mouth daily.  . budesonide  0.25 MG/2ML nebulizer solution Take 2 mLs (0.25 mg total) by nebulization 2 (two) times daily.  Marland Kitchen VITAMIN D3 5000 units  Take 5,000 Units by mouth daily with breakfast.  . citalopram  20 MG  Take 1 tablet (20 mg total) by mouth daily with supper.  . cyclobenzaprine  10 MG  Take 1 tab 3  times daily as needed for muscle spasms.  . ferrous sulfate 325 MG  Take 325 mg by mouth daily.   Marland Kitchen gabapentin  400 MG  Take 1 capsule (400 mg total) by mouth 3 (three) times daily.  Marland Kitchen glucose blood test strip Take sugars once daily  . ipratropium  0.02 % neb soln use 1 vial in nebulizer EVERY 4 HOURS AS NEEDED FOR WHEEZING OR SHORTNESS OF BREATH 75/15=5  . Lactulose 10 GM/15ML soln take 95ms TWICE DAILY (Patient taking differently: Take 10 g by mouth daily. )  . Magnesium 250 MG  Take 250 mg by mouth at bedtime.   .Marland Kitchennystatin cream  Apply 1 application topically 2 (two) times daily. (Patient taking differently: Apply 1 application topically 2 (two) times daily as needed for dry skin (itching). )  . ondansetron  4 MG Take 1 tablet (4 mg total) by mouth every 6 (six) hours as needed for nausea or vomiting.  . OXYGEN Inhale 2-3 L into the lungs continuous. 3 L with exertion  . pantoprazole  40  MG Take 1 tablet (40 mg total) by mouth daily. (Patient taking differently: Take 40 mg by mouth daily with supper. )  . K-DUR 10 MEQ  Take 2 tablet 3 times a day.  . rifampin  300 MG  Take 1 capsule TWICE DAILY day for Liver  . spironolactone  25 MG  TAKE 1 TABLET BY MOUTH THREE TIMES DAILY FOR FLUIDS AND SWELLING  . terconazole  0.4 % vag crm Place 1 applicator vaginally at bedtime. (Patient taking differently: Place 1 applicator vaginally at bedtime as needed (itching). )  . traMADol 50 MG tablet Take 50 mg by mouth every 12 hours as needed for pain for chronic pain  . triamcinolone crm ( 0.1 % Apply 1 application topically 3 (three) times daily as needed for pain (Patient taking differently: Apply 1 application topically 3 (three) times daily as needed (itching). )   Allergies  Allergen Reactions  . Atorvastatin Other (See Comments)    Unknown reaction  . Diphenhydramine Hcl (Sleep) Hives  . Hydrocodone-Acetaminophen Other (See Comments)    Unknown reaction  . Lopid [Gemfibrozil] Other (See Comments)    Unknown reaction  .  Loratadine Hives  . Lorazepam Hives  . Simvastatin Other (See Comments)    Unknown reaction  . Sulfamethoxazole Hives  . Sulfonamide Derivatives Hives    Past Medical History:  Diagnosis Date  . Asthma   . Cancer [facility])    liver cancer   . COPD (chronic obstructive pulmonary disease) ([place])    x 3 years   . Depression   . Diabetes mellitus without complication ([place])    diet controlled  not on meds   . Edema    left leg   . Esophageal varices ([place])   . Fibromyalgia   . Gastritis   . GERD (gastroesophageal reflux disease)   . Hepatic cirrhosis ([place])   . Hepatic encephalopathy ([place])   . History of blood transfusion   . Hyperlipidemia   . Hypertension   . IBS (irritable bowel syndrome)   . Phlebitis    left leg x 2   . Pneumonia    hx of   . Splenomegaly   . Vitamin D deficiency    Past Surgical History:  Procedure Laterality Date  . ABDOMINAL  HYSTERECTOMY    . BACK SURGERY    . CATARACT EXTRACTION, BILATERAL    . CHOLECYSTECTOMY    . ESOPHAGOGASTRODUODENOSCOPY  07/16/2012   Procedure: ESOPHAGOGASTRODUODENOSCOPY (EGD);  Surgeon: Inda Castle, MD;  [facility];  Service: Endoscopy;  Laterality: N/A;  . GASTRIC VARICES BANDING  07/16/2012   Procedure: GASTRIC VARICES BANDING;  Surgeon: Inda Castle, MD;  Location: [facility];  Service: Endoscopy;  Laterality: N/A;  . HIP ARTHROPLASTY Bilateral   . IR GENERIC HISTORICAL  06/25/2016   IR RADIOLOGIST EVAL & MGMT 06/25/2016 [facility]  . IR GENERIC HISTORICAL  06/27/2016   IR KYPHO LUMBAR INC FX REDUCE BONE BX UNI/BIL CANNULATION INC/IMAGING 06/27/2016 Luanne Bras, MD [facility]  . IR GENERIC HISTORICAL  07/17/2016   IR RADIOLOGIST EVAL & MGMT 07/17/2016 [facility]  . IR RADIOLOGIST EVAL & MGMT  07/10/2017  . IR RADIOLOGIST EVAL & MGMT  10/01/2017  . IR THORACENTESIS ASP PLEURAL SPACE W/IMG GUIDE  09/12/2017  . IR THORACENTESIS ASP PLEURAL SPACE W/IMG GUIDE  09/17/2017  . NECK SURGERY    . RADIOFREQUENCY ABLATION N/A 08/30/2017   Procedure: CT MICROWAVE THERMAL ABLATION;  Surgeon: Arne Cleveland, MD;  Location: [facility];  Service: Anesthesiology;  Laterality: N/A;  . SHOULDER SURGERY Right    Review of Systems  10 point systems review negative except as above.    Objective:   Physical Exam  BP (!) 142/76   Pulse 72   Temp (!) 97.5 F (36.4 C)   Resp 18   Ht 5' 2"  (1.575 m)   Wt 179 lb (81.2 kg)   BMI 32.74 kg/m   HEENT - WNL.Nasal O2.  Neck - supple. No JVD. Chest - Clear  BS. Cor - Nl HS. RRR w/o sig MGR. PP obscured by 3+ bilat LE edema to the knees. Moderate tender erythema of the Rt shin /calf with quasi(+) Homan's sig. Severe nodular pachyderma of the shins bilateral. MS- FROM w/o deformities.  In Wheelchair. Neuro -  Nl w/o focal abnormalities.    Assessment & Plan:   1. Essential hypertension  - torsemide (DEMADEX) 20 MG tablet;  Take 3 tablets 2 x/ day  Morning & Nite for fluid Retention & Swelling  Dispense: 180 tablet; Refill: 5  2. Venous insufficiency  - increase  - torsemide (DEMADEX) 20 MG tablet; Take 3 tablets  2 x/ day  Morning & Nite for fluid Retention & Swelling  Dispense: 180 tablet; Refill: 5  3. Cellulitis of right leg  - cephALEXin (KEFLEX) 500 MG capsule; Take 1 capsule (500 mg total) by mouth 4 (four) times daily for 10 days.  Dispense: 40 capsule; Refill: 0 - VAS [place] LOWER EXTREMITY VENOUS (DVT); Future  4. Medication management  - VAS [place] LOWER EXTREMITY VENOUS (DVT); Future  - discussed meds / SE's & ROV prn.  - ROV  10 days to recheck.

## 2017-11-26 ENCOUNTER — Ambulatory Visit (HOSPITAL_COMMUNITY)
Admission: RE | Admit: 2017-11-26 | Discharge: 2017-11-26 | Disposition: A | Discharge status: Home / Self Care | Payer: PPO | Source: Ambulatory Visit | Attending: Vascular Surgery | Admitting: Vascular Surgery

## 2017-11-26 ENCOUNTER — Telehealth: Payer: Self-pay | Admitting: *Deleted

## 2017-11-26 DIAGNOSIS — M79605 Pain in left leg: Secondary | ICD-10-CM | POA: Insufficient documentation

## 2017-11-26 DIAGNOSIS — M7989 Other specified soft tissue disorders: Secondary | ICD-10-CM

## 2017-11-26 DIAGNOSIS — Z86718 Personal history of other venous thrombosis and embolism: Secondary | ICD-10-CM | POA: Insufficient documentation

## 2017-11-26 DIAGNOSIS — M79604 Pain in right leg: Secondary | ICD-10-CM | POA: Diagnosis not present

## 2017-11-26 NOTE — Telephone Encounter (Signed)
Spouse advised the doppler study was negative for a DVT. Per Dr Melford Aase, she should take the antibiotic ordered for her on 11/25/2017.

## 2017-12-03 ENCOUNTER — Other Ambulatory Visit: Payer: Self-pay | Admitting: Internal Medicine

## 2017-12-03 ENCOUNTER — Telehealth: Payer: Self-pay | Admitting: *Deleted

## 2017-12-03 NOTE — Telephone Encounter (Signed)
Souse called and reported the patient is still gaining weight, continues to have edema in legs, with pain and itching. She is taking Torsemide 20 mg 3 tablets in the  Am and 3 tablets in the PM. Per Dr Melford Aase, restart the Metolazone 5 mg 1 tablet daily, but check the patient's weight and if it has decreased more than 2 pounds from one day to the next, leave the Metolazone off that day. Spouse expressed understanding.

## 2017-12-05 ENCOUNTER — Encounter: Payer: Self-pay | Admitting: Internal Medicine

## 2017-12-05 ENCOUNTER — Ambulatory Visit (INDEPENDENT_AMBULATORY_CARE_PROVIDER_SITE_OTHER): Payer: PPO | Admitting: Internal Medicine

## 2017-12-05 VITALS — BP 108/60 | HR 68 | Temp 97.8°F | Resp 18 | Ht 62.0 in | Wt 181.0 lb

## 2017-12-05 DIAGNOSIS — R188 Other ascites: Secondary | ICD-10-CM

## 2017-12-05 DIAGNOSIS — Z79899 Other long term (current) drug therapy: Secondary | ICD-10-CM | POA: Diagnosis not present

## 2017-12-05 DIAGNOSIS — K7460 Unspecified cirrhosis of liver: Secondary | ICD-10-CM

## 2017-12-05 DIAGNOSIS — E7220 Disorder of urea cycle metabolism, unspecified: Secondary | ICD-10-CM

## 2017-12-05 DIAGNOSIS — C220 Liver cell carcinoma: Secondary | ICD-10-CM | POA: Diagnosis not present

## 2017-12-05 DIAGNOSIS — I872 Venous insufficiency (chronic) (peripheral): Secondary | ICD-10-CM

## 2017-12-05 DIAGNOSIS — K7290 Hepatic failure, unspecified without coma: Secondary | ICD-10-CM | POA: Diagnosis not present

## 2017-12-05 DIAGNOSIS — K7581 Nonalcoholic steatohepatitis (NASH): Secondary | ICD-10-CM

## 2017-12-05 DIAGNOSIS — K7682 Hepatic encephalopathy: Secondary | ICD-10-CM

## 2017-12-05 LAB — COMPLETE METABOLIC PANEL WITH GFR
AG Ratio: 1.4 (calc) (ref 1.0–2.5)
ALBUMIN MSPROF: 3 g/dL — AB (ref 3.6–5.1)
ALKALINE PHOSPHATASE (APISO): 138 U/L — AB (ref 33–130)
ALT: 15 U/L (ref 6–29)
AST: 22 U/L (ref 10–35)
BUN/Creatinine Ratio: 21 (calc) (ref 6–22)
BUN: 28 mg/dL — ABNORMAL HIGH (ref 7–25)
CO2: 37 mmol/L — AB (ref 20–32)
CREATININE: 1.34 mg/dL — AB (ref 0.60–0.93)
Calcium: 8.9 mg/dL (ref 8.6–10.4)
Chloride: 97 mmol/L — ABNORMAL LOW (ref 98–110)
GFR, EST AFRICAN AMERICAN: 46 mL/min/{1.73_m2} — AB (ref >=60)
GFR, EST NON AFRICAN AMERICAN: 40 mL/min/{1.73_m2} — AB (ref >=60)
Globulin: 2.2 g/dL (calc) (ref 1.9–3.7)
Glucose, Bld: 87 mg/dL (ref 65–99)
Potassium: 2.9 mmol/L — ABNORMAL LOW (ref 3.5–5.3)
SODIUM: 140 mmol/L (ref 135–146)
Total Bilirubin: 1.1 mg/dL (ref 0.2–1.2)
Total Protein: 5.2 g/dL — ABNORMAL LOW (ref 6.1–8.1)

## 2017-12-05 LAB — CBC WITH DIFFERENTIAL/PLATELET
BASOS ABS: 32 {cells}/uL (ref 0–200)
Basophils Relative: 0.7 %
Eosinophils Absolute: 612 cells/uL — ABNORMAL HIGH (ref 15–500)
Eosinophils Relative: 13.6 %
HCT: 27.3 % — ABNORMAL LOW (ref 35.0–45.0)
Hemoglobin: 9 g/dL — ABNORMAL LOW (ref 11.7–15.5)
Lymphs Abs: 734 cells/uL — ABNORMAL LOW (ref 850–3900)
MCH: 32.3 pg (ref 27.0–33.0)
MCHC: 33 g/dL (ref 32.0–36.0)
MCV: 97.8 fL (ref 80.0–100.0)
MPV: 12.5 fL (ref 7.5–12.5)
Monocytes Relative: 5.6 %
NEUTROS PCT: 63.8 %
Neutro Abs: 2871 cells/uL (ref 1500–7800)
Platelets: 94 10*3/uL — ABNORMAL LOW (ref 140–400)
RBC: 2.79 10*6/uL — AB (ref 3.80–5.10)
RDW: 14.2 % (ref 11.0–15.0)
Total Lymphocyte: 16.3 %
WBC: 4.5 10*3/uL (ref 3.8–10.8)
WBCMIX: 252 {cells}/uL (ref 200–950)

## 2017-12-05 NOTE — Progress Notes (Signed)
Subjective:    Patient ID: [name], female    DOB: 07-24-1946, 72 y.o.   MRN: [id number]  HPI    Patient was seen 10 days ago w/cellulitis and worsening stasis edema of her legs and Torsemide 20 mg  was increased to 3 tabs bid and also tx'd w/Keklex .   Medication Sig  . albuterol (PROVENTIL HFA;VENTOLIN HFA) 108 (90 Base) MCG/ACT inhaler Inhale 2 puffs into the lungs every 6 (six) hours as needed for wheezing or shortness of breath.  . allopurinol (ZYLOPRIM) 300 MG tablet take 1 tablet (300 mg) by mouth daily with supper - for gout  . ALPRAZolam (XANAX) 0.25 MG tablet Take 1 tab as needed for anxiety, cramping, air hunger every 8 hours as needed.  Marland Kitchen aspirin EC 81 MG tablet Take 81 mg by mouth at bedtime.  . bisoprolol (ZEBETA) 10 MG tablet Take 1 tablet (10 mg total) by mouth daily.  . budesonide (PULMICORT) 0.25 MG/2ML nebulizer solution Take 2 mLs (0.25 mg total) by nebulization 2 (two) times daily.  . cephALEXin (KEFLEX) 500 MG capsule Take 1 capsule (500 mg total) by mouth 4 (four) times daily for 10 days.  . Cholecalciferol (VITAMIN D3) 5000 units CAPS Take 5,000 Units by mouth daily with breakfast.  . citalopram (CELEXA) 20 MG tablet Take 1 tablet (20 mg total) by mouth daily with supper.  . cyclobenzaprine (FLEXERIL) 10 MG tablet Take 1 tablet (10 mg total) by mouth 3 (three) times daily as needed for muscle spasms.  . Ferrous 325 MG tablet Take 3 daily.   Marland Kitchen gabapentin 400 MG capsule Take 1 caps 3  times daily.  Marland Kitchen glucose blood test strip Take sugars once daily  . ipratropium  Neb soln 1 vial i EVERY 4 HOURS   . CHRONULAC 15ML soln Take  daily  . Magnesium 250 MG TABS Take  at bedtime.   Marland Kitchen nystatin cream  Apply application topically 2  times daily  . ondansetron 4 MG tablet Take 1 tab every 6 hours needed  nausea   . pantoprazole  40 MG tablet Take 1 tab daily.   . potassium cl 10 MEQ tablet Take 2 tablet 3 times a day.  . rifampin  300 MG capsule Take 1 cap TWICE DAILY  day for Liver  . spironolactone  25 MG tablet TAKE 1 TAB THREE TIMES DAILY   . torsemide  20 MG tablet Take 3 tablets 2 x/ day  Morning & Nite   . traMADol  50 MG tablet Take every 12 hours as needed for pain   . KENALOG 0.1 % Apply topically 3  times daily as needed    Allergies  Allergen Reactions  . Atorvastatin Other (See Comments)    Unknown reaction  . Diphenhydramine Hcl (Sleep) Hives  . Hydrocodone-Acetaminophen Other (See Comments)    Unknown reaction  . Lopid [Gemfibrozil] Other (See Comments)    Unknown reaction  . Loratadine Hives  . Lorazepam Hives  . Simvastatin Other (See Comments)    Unknown reaction  . Sulfamethoxazole Hives  . Sulfonamide Derivatives Hives   Past Medical History:  Diagnosis Date  . Asthma   . Cancer [facility])    liver cancer   . COPD (chronic obstructive pulmonary disease) ([place])    x 3 years   . Depression   . Diabetes mellitus without complication ([place])    diet controlled  not on meds   . Edema    left leg   .  Esophageal varices ([place])   . Fibromyalgia   . Gastritis   . GERD (gastroesophageal reflux disease)   . Hepatic cirrhosis ([place])   . Hepatic encephalopathy ([place])   . History of blood transfusion   . Hyperlipidemia   . Hypertension   . IBS (irritable bowel syndrome)   . Phlebitis    left leg x 2   . Pneumonia    hx of   . Splenomegaly   . Vitamin D deficiency    Past Surgical History:  Procedure Laterality Date  . ABDOMINAL HYSTERECTOMY    . BACK SURGERY    . CATARACT EXTRACTION, BILATERAL    . CHOLECYSTECTOMY    . ESOPHAGOGASTRODUODENOSCOPY  07/16/2012   Procedure: ESOPHAGOGASTRODUODENOSCOPY (EGD);  Surgeon: Inda Castle, MD;  [facility];  Service: Endoscopy;  Laterality: N/A;  . GASTRIC VARICES BANDING  07/16/2012   Procedure: GASTRIC VARICES BANDING;  Surgeon: Inda Castle, MD;  [facility];  Service: Endoscopy;  Laterality: N/A;  . HIP ARTHROPLASTY Bilateral   . IR GENERIC HISTORICAL   06/25/2016   IR RADIOLOGIST EVAL & MGMT 06/25/2016 [facility]  . IR GENERIC HISTORICAL  06/27/2016   IR KYPHO LUMBAR INC FX REDUCE BONE BX UNI/BIL CANNULATION INC/IMAGING 06/27/2016 Luanne Bras, MD [facility]  . IR GENERIC HISTORICAL  07/17/2016   IR RADIOLOGIST EVAL & MGMT 07/17/2016 [facility]  . IR RADIOLOGIST EVAL & MGMT  07/10/2017  . IR RADIOLOGIST EVAL & MGMT  10/01/2017  . IR THORACENTESIS ASP PLEURAL SPACE W/IMG GUIDE  09/12/2017  . IR THORACENTESIS ASP PLEURAL SPACE W/IMG GUIDE  09/17/2017  . NECK SURGERY    . RADIOFREQUENCY ABLATION N/A 08/30/2017   Procedure: CT MICROWAVE THERMAL ABLATION;  Surgeon: Arne Cleveland, MD;  Location: [facility];  Service: Anesthesiology;  Laterality: N/A;  . SHOULDER SURGERY Right    Review of Systems  10 point systems review negative except as above.    Objective:   Physical Exam   BP 108/60   Pulse 68   Temp 97.8 F (36.6 C)   Resp 18   Ht 5' 2"  (1.575 m)   Wt 181 lb (82.1 kg)   SpO2 97%   BMI 33.11 kg/m  On Nasal O2.   HEENT - WNL. Neck - supple.  Chest - Clear equal BS. Cor - Nl HS. RRR w/o sig MGR. PP 1(+).  Chronic brawny edema to the prox shins bilat. Abd - protuberant, soft /doughy w/o point tenderness or guarding.  MS- FROM w/o deformities.   In Wheelchair.  Neuro -  Nl w/o focal abnormalities. Dulled cognition w/o liver flap.     Assessment & Plan:   1. Liver cirrhosis secondary to nonalcoholic steatohepatitis (NASH) (HCC)  - CBC with Differential/Platelet - COMPLETE METABOLIC PANEL WITH GFR - Protime-INR  2. Hepatocellular carcinoma (H[facility])  - AFP Tumor Marker  3. Hepatic encephalopathy (H[facility])  - Ammonia  4. Hyperammonemia ([facility])  - Ammonia  5. Other ascites - COMPLETE METABOLIC PANEL WITH GFR  6. Venous insufficiency  - COMPLETE METABOLIC PANEL WITH GFR  7. Medication management  - CBC with Differential/Platelet - COMPLETE METABOLIC PANEL WITH GFR - AFP Tumor Marker - Protime-INR -  Ammonia    Husband  Still seems to have limited insight into the severity of her illness and poor prognosis.

## 2017-12-06 ENCOUNTER — Other Ambulatory Visit: Payer: Self-pay | Admitting: Internal Medicine

## 2017-12-06 DIAGNOSIS — M25552 Pain in left hip: Secondary | ICD-10-CM | POA: Diagnosis not present

## 2017-12-06 DIAGNOSIS — B373 Candidiasis of vulva and vagina: Secondary | ICD-10-CM

## 2017-12-06 DIAGNOSIS — M7062 Trochanteric bursitis, left hip: Secondary | ICD-10-CM | POA: Diagnosis not present

## 2017-12-06 DIAGNOSIS — Z96643 Presence of artificial hip joint, bilateral: Secondary | ICD-10-CM | POA: Diagnosis not present

## 2017-12-06 DIAGNOSIS — B3731 Acute candidiasis of vulva and vagina: Secondary | ICD-10-CM

## 2017-12-06 DIAGNOSIS — E876 Hypokalemia: Secondary | ICD-10-CM

## 2017-12-06 LAB — PROTIME-INR
INR: 1
PROTHROMBIN TIME: 10.9 s (ref 9.0–11.5)

## 2017-12-06 LAB — AMMONIA: Ammonia: 74 umol/L — ABNORMAL HIGH (ref <=72)

## 2017-12-06 LAB — AFP TUMOR MARKER: AFP-Tumor Marker: 3.3 ng/mL

## 2017-12-10 ENCOUNTER — Encounter: Payer: Self-pay | Admitting: Gastroenterology

## 2017-12-10 ENCOUNTER — Ambulatory Visit: Payer: Self-pay

## 2017-12-10 ENCOUNTER — Ambulatory Visit (INDEPENDENT_AMBULATORY_CARE_PROVIDER_SITE_OTHER): Payer: PPO | Admitting: Gastroenterology

## 2017-12-10 ENCOUNTER — Other Ambulatory Visit (INDEPENDENT_AMBULATORY_CARE_PROVIDER_SITE_OTHER): Payer: PPO

## 2017-12-10 VITALS — BP 126/82 | HR 82 | Ht 62.0 in | Wt 178.0 lb

## 2017-12-10 DIAGNOSIS — L03119 Cellulitis of unspecified part of limb: Secondary | ICD-10-CM

## 2017-12-10 DIAGNOSIS — K7290 Hepatic failure, unspecified without coma: Secondary | ICD-10-CM

## 2017-12-10 DIAGNOSIS — K7460 Unspecified cirrhosis of liver: Secondary | ICD-10-CM | POA: Diagnosis not present

## 2017-12-10 DIAGNOSIS — C220 Liver cell carcinoma: Secondary | ICD-10-CM

## 2017-12-10 DIAGNOSIS — R188 Other ascites: Secondary | ICD-10-CM | POA: Diagnosis not present

## 2017-12-10 DIAGNOSIS — I8500 Esophageal varices without bleeding: Secondary | ICD-10-CM

## 2017-12-10 DIAGNOSIS — K7682 Hepatic encephalopathy: Secondary | ICD-10-CM

## 2017-12-10 LAB — COMPREHENSIVE METABOLIC PANEL
ALT: 14 U/L (ref 0–35)
AST: 25 U/L (ref 0–37)
Albumin: 2.9 g/dL — ABNORMAL LOW (ref 3.5–5.2)
Alkaline Phosphatase: 111 U/L (ref 39–117)
BUN: 34 mg/dL — AB (ref 6–23)
CHLORIDE: 94 meq/L — AB (ref 96–112)
CO2: 38 mEq/L — ABNORMAL HIGH (ref 19–32)
CREATININE: 1.27 mg/dL — AB (ref 0.40–1.20)
Calcium: 8.9 mg/dL (ref 8.4–10.5)
GFR: 44.08 mL/min — ABNORMAL LOW (ref >60.00)
Glucose, Bld: 141 mg/dL — ABNORMAL HIGH (ref 70–99)
POTASSIUM: 2.9 meq/L — AB (ref 3.5–5.1)
SODIUM: 139 meq/L (ref 135–145)
Total Bilirubin: 0.9 mg/dL (ref 0.2–1.2)
Total Protein: 5.3 g/dL — ABNORMAL LOW (ref 6.0–8.3)

## 2017-12-10 MED ORDER — AMOXICILLIN-POT CLAVULANATE 875-125 MG PO TABS: 1.0000 | ORAL_TABLET | Freq: Two times a day (BID) | ORAL | 0 refills | Status: DC

## 2017-12-10 MED ORDER — DOXYCYCLINE HYCLATE 100 MG PO CAPS: 100.0000 mg | ORAL_CAPSULE | Freq: Two times a day (BID) | ORAL | 0 refills | Status: DC

## 2017-12-10 NOTE — Progress Notes (Signed)
HPI :  72 year old female with oxygen dependant COPD with a history of suspected [name] cirrhosis, complicated by esophageal varices and hepatic encephalopathy previously, now with a diagnosis of hepatocellular carcinoma more recently, here for follow-up visit.  She had hepatocellular carcinoma detected on a routine screening ultrasound for her cirrhosis. This led to an MRI of the liver last November which suggested the diagnosis. She had a confirmatory liver biopsy in December. Her case was presented at a multidisciplinary conference, it was decided she have ablative therapy per interventional radiology as she is not a good transplant candidate. This was done in January. She was hospitalized a few times in February for acute on chronic respiratory failure, ascites and pleural effusion. She's had aggressive management with diuretics to improve her volume status..   More recently she has been taking torsemide 60 mg in the morning and 60 mg at night. She states she is also taking additional diuretic as needed but does not know the name of it. She has not been on Lasix recently. She has been on Aldactone remotely but held ? Related to electrolyte issues. More recently she has had problems with hypokalemia and on high-dose potassium supplementation. She has been on lactulose for encephalopathy. She cannot afford rifaximin. She is also on what appears to be rifampin, family thinks this is for encephalopathy.   She's had problems with lower extremity edema and been treated for a possible cellulitis of her left leg in recent weeks with Keflex. The patient's main issue today is that she has worsening pain in the left thigh along with what she thinks is worsening erythema. Her edema on the diuretic regimen appears improved. She is scheduled for an MRI with Dr. Vernard Gambles of interventional radiology tomorrow. She states her breathing is stable, no clinical evidence of recurrent effusion. She denies abdominal pains. No  fevers.  Endoscopic history EGD 10/22/13 - grace 2 varices, no stricture Colonoscopy 06/2008 - multiple colon polyps, - adenomas / tubulovillous adenoma    Past Medical History:  Diagnosis Date  . Asthma   . Cancer [facility])    liver cancer   . COPD (chronic obstructive pulmonary disease) ([place])    x 3 years   . Depression   . Diabetes mellitus without complication ([place])    diet controlled  not on meds   . Edema    left leg   . Esophageal varices ([place])   . Fibromyalgia   . Gastritis   . GERD (gastroesophageal reflux disease)   . Hepatic cirrhosis ([place])   . Hepatic encephalopathy ([place])   . History of blood transfusion   . Hyperlipidemia   . Hypertension   . IBS (irritable bowel syndrome)   . Phlebitis    left leg x 2   . Pneumonia    hx of   . Splenomegaly   . Vitamin D deficiency      Past Surgical History:  Procedure Laterality Date  . ABDOMINAL HYSTERECTOMY    . BACK SURGERY    . CATARACT EXTRACTION, BILATERAL    . CHOLECYSTECTOMY    . ESOPHAGOGASTRODUODENOSCOPY  07/16/2012   Procedure: ESOPHAGOGASTRODUODENOSCOPY (EGD);  Surgeon: Inda Castle, MD;  Location: [facility];  Service: Endoscopy;  Laterality: N/A;  . GASTRIC VARICES BANDING  07/16/2012   Procedure: GASTRIC VARICES BANDING;  Surgeon: Inda Castle, MD;  Location: [facility];  Service: Endoscopy;  Laterality: N/A;  . HIP ARTHROPLASTY Bilateral   . IR GENERIC HISTORICAL  06/25/2016   IR RADIOLOGIST  EVAL & MGMT 06/25/2016 [facility]  . IR GENERIC HISTORICAL  06/27/2016   IR KYPHO LUMBAR INC FX REDUCE BONE BX UNI/BIL CANNULATION INC/IMAGING 06/27/2016 Luanne Bras, MD [facility]  . IR GENERIC HISTORICAL  07/17/2016   IR RADIOLOGIST EVAL & MGMT 07/17/2016 [facility]  . IR RADIOLOGIST EVAL & MGMT  07/10/2017  . IR RADIOLOGIST EVAL & MGMT  10/01/2017  . IR THORACENTESIS ASP PLEURAL SPACE W/IMG GUIDE  09/12/2017  . IR THORACENTESIS ASP PLEURAL SPACE W/IMG GUIDE  09/17/2017  . NECK SURGERY      . RADIOFREQUENCY ABLATION N/A 08/30/2017   Procedure: CT MICROWAVE THERMAL ABLATION;  Surgeon: Arne Cleveland, MD;  Location: [facility] ORS;  Service: Anesthesiology;  Laterality: N/A;  . SHOULDER SURGERY Right    Family History  Problem Relation Age of Onset  . Diabetes Brother        deceased  . Heart disease Sister        A Fib  . Heart disease Mother        CHF  . Atrial fibrillation Sister   . Cancer Father        lung cancer   . Cancer Maternal Uncle        unknown type cancer   . Cancer Other        lung cancer   . Colon cancer Neg Hx    Social History   Tobacco Use  . Smoking status: Former Smoker    Packs/day: 1.00    Years: 50.00    Pack years: 50.00    Types: Cigarettes    Last attempt to quit: 11/05/2014    Years since quitting: 3.0  . Smokeless tobacco: Never Used  . Tobacco comment: Quit April 2016  Substance Use Topics  . Alcohol use: No    Alcohol/week: 0.0 oz  . Drug use: No   Current Outpatient Medications  Medication Sig Dispense Refill  . albuterol (PROVENTIL HFA;VENTOLIN HFA) 108 (90 Base) MCG/ACT inhaler Inhale 2 puffs into the lungs every 6 (six) hours as needed for wheezing or shortness of breath. 1 Inhaler 2  . allopurinol (ZYLOPRIM) 300 MG tablet take 1 tablet (300 mg) by mouth daily with supper - for gout 30 tablet 0  . ALPRAZolam (XANAX) 0.25 MG tablet Take 1 tab as needed for anxiety, cramping, air hunger every 8 hours as needed. 90 tablet 0  . aspirin EC 81 MG tablet Take 81 mg by mouth at bedtime.    . bisoprolol (ZEBETA) 10 MG tablet Take 1 tablet (10 mg total) by mouth daily. 90 tablet 0  . budesonide (PULMICORT) 0.25 MG/2ML nebulizer solution Take 2 mLs (0.25 mg total) by nebulization 2 (two) times daily. 60 mL 12  . Cholecalciferol (VITAMIN D3) 5000 units CAPS Take 5,000 Units by mouth daily with breakfast.    . citalopram (CELEXA) 20 MG tablet Take 1 tablet (20 mg total) by mouth daily with supper. 30 tablet 0  . cyclobenzaprine (FLEXERIL)  10 MG tablet Take 1 tablet (10 mg total) by mouth 3 (three) times daily as needed for muscle spasms. 90 tablet 2  . ferrous sulfate 325 (65 FE) MG tablet Take 325 mg by mouth daily.     Marland Kitchen gabapentin (NEURONTIN) 400 MG capsule Take 1 capsule (400 mg total) by mouth 3 (three) times daily. 90 capsule 0  . glucose blood test strip Take sugars once daily 50 each 12  . ipratropium (ATROVENT) 0.02 % nebulizer solution use 1 vial in  nebulizer EVERY 4 HOURS AS NEEDED FOR WHEEZING OR SHORTNESS OF BREATH 75/15=5 75 mL 3  . lactulose (CHRONULAC) 10 GM/15ML solution take 17ms TWICE DAILY (Patient taking differently: Take 10 g by mouth daily. ) 2700 mL 2  . Magnesium 250 MG TABS Take 250 mg by mouth at bedtime.     . NONFORMULARY OR COMPOUNDED ITEM Apply 1 application topically See admin instructions. SALICYLIC ACID ACID (PETROLATUM) 50% OINTMENT-APPLY TO AFFECTED AREA AT BEDTIME PRN DRY SKIN/LEGS (Compounded at [facility]    . nystatin cream (MYCOSTATIN) Apply 1 application topically 2 (two) times daily. (Patient taking differently: Apply 1 application topically 2 (two) times daily as needed for dry skin (itching). ) 30 g 1  . ondansetron (ZOFRAN) 4 MG tablet Take 1 tablet (4 mg total) by mouth every 6 (six) hours as needed for nausea or vomiting. 30 tablet 1  . OXYGEN Inhale 2-3 L into the lungs continuous. 3 L with exertion    . pantoprazole (PROTONIX) 40 MG tablet Take 1 tablet (40 mg total) by mouth daily. (Patient taking differently: Take 40 mg by mouth daily with supper. ) 90 tablet 3  . potassium chloride (K-DUR) 10 MEQ tablet Take 2 tablet 3 times a day. 180 tablet 2  . rifampin (RIFADIN) 300 MG capsule Take 1 capsule TWICE DAILY day for Liver 180 capsule 1  . terconazole (TERAZOL 7) 0.4 % vaginal cream Place 1 applicator vaginally at bedtime. (Patient taking differently: Place 1 applicator vaginally at bedtime as needed (itching). ) 45 g 2  . torsemide (DEMADEX) 20 MG tablet Take 3 tablets 2  x/ day  Morning & Nite for fluid Retention & Swelling 180 tablet 5  . traMADol (ULTRAM) 50 MG tablet Take 50 mg by mouth every 12 hours as needed for pain for chronic pain 60 tablet 0  . triamcinolone cream (KENALOG) 0.1 % APPLY TOPICALLY 3 (THREE) TIMES A DAY AS NEEDED 90 g 3   No current facility-administered medications for this visit.    Allergies  Allergen Reactions  . Atorvastatin Other (See Comments)    Unknown reaction  . Diphenhydramine Hcl (Sleep) Hives  . Hydrocodone-Acetaminophen Other (See Comments)    Unknown reaction  . Lopid [Gemfibrozil] Other (See Comments)    Unknown reaction  . Loratadine Hives  . Lorazepam Hives  . Simvastatin Other (See Comments)    Unknown reaction  . Sulfamethoxazole Hives  . Sulfonamide Derivatives Hives     Review of Systems: All systems reviewed and negative except where noted in HPI.    Ir Abdomen [place]  Result Date: 11/20/2017 CLINICAL DATA:  72year old female with abdominal distension presents for ultrasound-guided paracentesis. EXAM: LIMITED ABDOMEN ULTRASOUND FOR ASCITES TECHNIQUE: Limited ultrasound survey for ascites was performed in all four abdominal quadrants. COMPARISON:  Prior paracentesis 09/22/2017 FINDINGS: Sonographic interrogation of the 4 quadrants of the abdomen demonstrates no intra-abdominal ascites at this time. IMPRESSION: 1. No evidence of ascites sonographically. Paracentesis was not performed. Electronically Signed   By: HJacqulynn CadetM.D.   On: 11/20/2017 10:15   Lab Results  Component Value Date   ALT 14 12/10/2017   AST 25 12/10/2017   ALKPHOS 111 12/10/2017   BILITOT 0.9 12/10/2017    Lab Results  Component Value Date   WBC 4.5 12/05/2017   HGB 9.0 (L) 12/05/2017   HCT 27.3 (L) 12/05/2017   MCV 97.8 12/05/2017   PLT 94 (L) 12/05/2017    Lab Results  Component Value Date  INR 1.0 12/05/2017   INR 1.11 11/14/2017   INR 1.24 09/23/2017   PROTIME 12.0 07/10/2017    Physical  Exam: BP 126/82   Pulse 82   Ht 5' 2"  (1.575 m)   Wt 178 lb (80.7 kg)   SpO2 96%   BMI 32.56 kg/m  Constitutional: Pleasant, female wearing oxygen, using walker to ambulate HEENT: Normocephalic and atraumatic. Conjunctivae are normal.  Neck supple.  Cardiovascular: Normal rate, regular rhythm.  Pulmonary/chest: Effort normal and breath sounds decreased B without wheezing, rales or rhonchi. Abdominal: Soft, mildly distended, nontender. there are no masses palpable.  Extremities: mild edema with chronic venous stasis on lower extremities, distal aspect of left upper leg with painful blanching erythema, warm to palpation, suggestive of cellulitis Lymphadenopathy: No cervical adenopathy noted. Neurological: Alert and oriented to person place and time. Skin: Skin is warm and dry. No rashes noted. Psychiatric: Normal mood and affect. Behavior is normal.   ASSESSMENT AND PLAN: 72 year old female here for reassessment of the following issues:  Cirrhosis with ascites / hepatocellular carcinoma / esophageal varices / hepatic encephalopathy - she is not a transplant candidate. Her malignancy has been treated with ablative therapy per interventional radiology. She appears to have gotten through this after a few hospitalizations and has an MRI pending tomorrow. Hopefully this shows good results. Her main issue has been with management of her volume status. She is on an aggressive diuretic regimen of torsemide but having issues with hypokalemia. We'll repeat her renal function today, may need to address her diuretics if hypokalemia persists, may add back aldactone and decrease torsemide. She should continue lactulose, unfortunately can't afford Rifaximin. I'm not certain why she is on rifampin and will discuss stopping this with her PCP.   Regarding her varices, she is high risk for anesthesia and wanted to avoid endoscopy/banding. She's been on Bisoprolol given intolerate of COPD to nonselective beta  blocker. Overall, she is stable at this time but need to keep close monitoring of her diuretic regimen and labs. Hopefully her MRI shows good results, she'll need close surveillance for hepatocellular carcinoma recurrence.  Cellulitis - I suspect she has a cellulitis of her left leg today, versus venous stasis change, treated previously with Keflex but its warm and tender to the touch, she thinks it I sspreading. We'll give her a course of amoxicillin with doxycycline for MRSA coverage. Hopefully this helps, if worsening or fever she needs to contact me or PCP.  I'll continue to see her every 3 months or sooner with issues.  [place] Cellar, MD [facility]

## 2017-12-10 NOTE — Patient Instructions (Addendum)
If you are age 71 or older, your body mass index should be between 23-30. Your Body mass index is 32.56 kg/m. If this is out of the aforementioned range listed, please consider follow up with your Primary Care Provider.  If you are age 36 or younger, your body mass index should be between 19-25. Your Body mass index is 32.56 kg/m. If this is out of the aformentioned range listed, please consider follow up with your Primary Care Provider.   We have sent the following medications to your pharmacy for you to pick up at your convenience: Amoxicillin Doxycycline  Please discontinue Rifampin  Please go to the lab in the basement of our building to have lab work done as you leave today.  Thank you for entrusting me with your care and for choosing [facility], Dr. Clayton Cellar

## 2017-12-11 ENCOUNTER — Encounter: Payer: Self-pay | Admitting: Radiology

## 2017-12-11 ENCOUNTER — Ambulatory Visit (HOSPITAL_COMMUNITY)
Admission: RE | Admit: 2017-12-11 | Discharge: 2017-12-11 | Disposition: A | Discharge status: Home / Self Care | Payer: PPO | Source: Ambulatory Visit | Attending: Interventional Radiology | Admitting: Interventional Radiology

## 2017-12-11 ENCOUNTER — Other Ambulatory Visit: Payer: Self-pay

## 2017-12-11 ENCOUNTER — Ambulatory Visit
Admission: RE | Admit: 2017-12-11 | Discharge: 2017-12-11 | Disposition: A | Discharge status: Home / Self Care | Payer: PPO | Source: Ambulatory Visit | Attending: Interventional Radiology | Admitting: Interventional Radiology

## 2017-12-11 DIAGNOSIS — R188 Other ascites: Secondary | ICD-10-CM | POA: Diagnosis not present

## 2017-12-11 DIAGNOSIS — C220 Liver cell carcinoma: Secondary | ICD-10-CM | POA: Insufficient documentation

## 2017-12-11 DIAGNOSIS — K766 Portal hypertension: Secondary | ICD-10-CM | POA: Insufficient documentation

## 2017-12-11 DIAGNOSIS — E876 Hypokalemia: Secondary | ICD-10-CM

## 2017-12-11 DIAGNOSIS — K7460 Unspecified cirrhosis of liver: Secondary | ICD-10-CM

## 2017-12-11 HISTORY — PX: IR RADIOLOGIST EVAL & MGMT: IMG5224

## 2017-12-11 MED ORDER — GADOBENATE DIMEGLUMINE 529 MG/ML IV SOLN
20.0000 mL | Freq: Once | INTRAVENOUS | Status: AC | PRN
  Administered 2017-12-11: 16 mL via INTRAVENOUS

## 2017-12-11 NOTE — Progress Notes (Addendum)
Patient ID: [name], female   DOB: 11-04-1945, 72 y.o.   MRN: [id number]       Chief Complaint: Patient was seen in consultation today for  Chief Complaint  Patient presents with  . Follow-up    3.5 mo follow up [place] of Hepatocellular Carcinoma   at the request of [name]  Referring Physician(s): [name]  History of Present Illness: [name] is a 72 y.o. female  with a past medical history significant for oxygen dependent COPD, [name] cirrhosis, esophageal varices, and hepatic encephalopathy who was found on ultrasound to have a new liver lesion. Follow-up MR demonstrated actually 2 lesions both with imaging characteristics consistent with hepatocellular carcinoma in this clinical scenario, in a background of advanced cirrhotic change with stigmata of portal venous hypertension including abdominal ascites. No evidence of extrahepatic spread of disease. Central mesenteric lymph nodes are stable in size. I performed confirmatory ultrasound-guided core biopsy of the left liver lesion, as the AFP was not significantly elevated.. She underwent subsequent CT-guided microwave ablation of both lesions on 08/30/2017. The procedure went well and the patient was discharged home. She returned to 09/25/2017 with abdominal distention, shortness of breath, nausea and was found to have ascites and large right pleural effusion.  She underwent thoracentesis removing 1.5 L.  Ultimately her right heart failure was treated medically, this all resolved and she was discharged home. She states that she is been doing well at home.  No right upper abdominal pain associated with the procedure.  Her breathing is better.  Appetite is good.  She still has leg swelling which responds to elevation.  She had her MRI today.  Past Medical History:  Diagnosis Date  . Asthma   . Cancer [facility])    liver cancer   . COPD (chronic obstructive pulmonary disease) ([place])    x 3 years   . Depression   . Diabetes  mellitus without complication ([place])    diet controlled  not on meds   . Edema    left leg   . Esophageal varices ([place])   . Fibromyalgia   . Gastritis   . GERD (gastroesophageal reflux disease)   . Hepatic cirrhosis ([place])   . Hepatic encephalopathy ([place])   . History of blood transfusion   . Hyperlipidemia   . Hypertension   . IBS (irritable bowel syndrome)   . Phlebitis    left leg x 2   . Pneumonia    hx of   . Splenomegaly   . Vitamin D deficiency     Past Surgical History:  Procedure Laterality Date  . ABDOMINAL HYSTERECTOMY    . BACK SURGERY    . CATARACT EXTRACTION, BILATERAL    . CHOLECYSTECTOMY    . ESOPHAGOGASTRODUODENOSCOPY  07/16/2012   Procedure: ESOPHAGOGASTRODUODENOSCOPY (EGD);  Surgeon: Inda Castle, MD;  [facility];  Service: Endoscopy;  Laterality: N/A;  . GASTRIC VARICES BANDING  07/16/2012   Procedure: GASTRIC VARICES BANDING;  Surgeon: Inda Castle, MD;  [facility];  Service: Endoscopy;  Laterality: N/A;  . HIP ARTHROPLASTY Bilateral   . IR GENERIC HISTORICAL  06/25/2016   IR RADIOLOGIST EVAL & MGMT 06/25/2016 [facility]  . IR GENERIC HISTORICAL  06/27/2016   IR KYPHO LUMBAR INC FX REDUCE BONE BX UNI/BIL CANNULATION INC/IMAGING 06/27/2016 Luanne Bras, MD [facility]  . IR GENERIC HISTORICAL  07/17/2016   IR RADIOLOGIST EVAL & MGMT 07/17/2016 [facility]  . IR RADIOLOGIST EVAL & MGMT  07/10/2017  . IR RADIOLOGIST EVAL & MGMT  10/01/2017  . IR RADIOLOGIST EVAL & MGMT  12/11/2017  . IR THORACENTESIS ASP PLEURAL SPACE W/IMG GUIDE  09/12/2017  . IR THORACENTESIS ASP PLEURAL SPACE W/IMG GUIDE  09/17/2017  . NECK SURGERY    . RADIOFREQUENCY ABLATION N/A 08/30/2017   Procedure: CT MICROWAVE THERMAL ABLATION;  Surgeon: Arne Cleveland, MD;  Location[facility];  Service: Anesthesiology;  Laterality: N/A;  . SHOULDER SURGERY Right     Allergies: Atorvastatin; Diphenhydramine hcl (sleep); Hydrocodone-acetaminophen; Lopid  [gemfibrozil]; Loratadine; Lorazepam; Simvastatin; Sulfamethoxazole; and Sulfonamide derivatives  Medications: Prior to Admission medications   Medication Sig Start Date End Date Taking? Authorizing Provider  albuterol (PROVENTIL HFA;VENTOLIN HFA) 108 (90 Base) MCG/ACT inhaler Inhale 2 puffs into the lungs every 6 (six) hours as needed for wheezing or shortness of breath. 05/27/16   Ward, Delice Bison, DO  allopurinol (ZYLOPRIM) 300 MG tablet take 1 tablet (300 mg) by mouth daily with supper - for gout 09/28/17   Allie Bossier, MD  ALPRAZolam Duanne Moron) 0.25 MG tablet Take 1 tab as needed for anxiety, cramping, air hunger every 8 hours as needed. 11/01/17   Liane Comber, NP  amoxicillin-clavulanate (AUGMENTIN) 875-125 MG tablet Take 1 tablet by mouth 2 (two) times daily. 12/10/17   Armbruster, Carlota Raspberry, MD  aspirin EC 81 MG tablet Take 81 mg by mouth at bedtime.    [provider]  bisoprolol (ZEBETA) 10 MG tablet Take 1 tablet (10 mg total) by mouth daily. 10/17/17   Unk Pinto, MD  budesonide (PULMICORT) 0.25 MG/2ML nebulizer solution Take 2 mLs (0.25 mg total) by nebulization 2 (two) times daily. 09/13/17   Desiree Hane, MD  Cholecalciferol (VITAMIN D3) 5000 units CAPS Take 5,000 Units by mouth daily with breakfast.    [provider]  citalopram (CELEXA) 20 MG tablet Take 1 tablet (20 mg total) by mouth daily with supper. 09/28/17   Allie Bossier, MD  cyclobenzaprine (FLEXERIL) 10 MG tablet Take 1 tablet (10 mg total) by mouth 3 (three) times daily as needed for muscle spasms. 03/06/16   Unk Pinto, MD  doxycycline (VIBRAMYCIN) 100 MG capsule Take 1 capsule (100 mg total) by mouth 2 (two) times daily. 12/10/17   Armbruster, Carlota Raspberry, MD  ferrous sulfate 325 (65 FE) MG tablet Take 325 mg by mouth daily.     [provider]  gabapentin (NEURONTIN) 400 MG capsule Take 1 capsule (400 mg total) by mouth 3 (three) times daily. 09/28/17   Allie Bossier, MD  glucose  blood test strip Take sugars once daily 01/21/17   Vicie Mutters, PA-C  ipratropium (ATROVENT) 0.02 % nebulizer solution use 1 vial in nebulizer EVERY 4 HOURS AS NEEDED FOR WHEEZING OR SHORTNESS OF BREATH 75/15=5 10/14/17   Vicie Mutters, PA-C  lactulose (CHRONULAC) 10 GM/15ML solution take 26ms TWICE DAILY Patient taking differently: Take 10 g by mouth daily.  08/08/17   MUnk Pinto MD  Magnesium 250 MG TABS Take 250 mg by mouth at bedtime.     [provider]  NONFORMULARY OR COMPOUNDED ITEM Apply 1 application topically See admin instructions. SALICYLIC ACID ACID (PETROLATUM) 50% OINTMENT-APPLY TO AFFECTED AREA AT BEDTIME PRN DRY SKIN/LEGS ([facility]    [provider]  nystatin cream (MYCOSTATIN) Apply 1 application topically 2 (two) times daily. Patient taking differently: Apply 1 application topically 2 (two) times daily as needed for dry skin (itching).  12/20/16   CSilverio Lay  Estill Bamberg, PA-C  ondansetron (ZOFRAN) 4 MG tablet Take 1 tablet (4 mg total) by mouth every 6 (six) hours as needed for nausea or vomiting. 09/02/17   Vicie Mutters, PA-C  OXYGEN Inhale 2-3 L into the lungs continuous. 3 L with exertion    [provider]  pantoprazole (PROTONIX) 40 MG tablet Take 1 tablet (40 mg total) by mouth daily. Patient taking differently: Take 40 mg by mouth daily with supper.  04/22/17   Unk Pinto, MD  potassium chloride (K-DUR) 10 MEQ tablet Take 2 tablet 3 times a day. 11/18/17   Unk Pinto, MD  terconazole (TERAZOL 7) 0.4 % vaginal cream Place 1 applicator vaginally at bedtime. Patient taking differently: Place 1 applicator vaginally at bedtime as needed (itching).  12/20/16   Vicie Mutters, PA-C  torsemide (DEMADEX) 20 MG tablet Take 3 tablets 2 x/ day  Morning & Nite for fluid Retention & Swelling 11/25/17   Unk Pinto, MD  traMADol Veatrice Bourbon) 50 MG tablet Take 50 mg by mouth every 12 hours as needed for pain for chronic  pain 11/18/17   Vicie Mutters, PA-C  triamcinolone cream (KENALOG) 0.1 % APPLY TOPICALLY 3 (THREE) TIMES A DAY AS NEEDED 12/06/17   Unk Pinto, MD     Family History  Problem Relation Age of Onset  . Diabetes Brother        deceased  . Heart disease Sister        A Fib  . Heart disease Mother        CHF  . Atrial fibrillation Sister   . Cancer Father        lung cancer   . Cancer Maternal Uncle        unknown type cancer   . Cancer Other        lung cancer   . Colon cancer Neg Hx     Social History   Socioeconomic History  . Marital status: Married    Spouse name: [name]  . Number of children: 5  . Years of education: Not on file  . Highest education level: Not on file  Occupational History  . Occupation: Arboriculturist: UNEMPLOYED  Social Needs  . Financial resource strain: Not on file  . Food insecurity:    Worry: Not on file    Inability: Not on file  . Transportation needs:    Medical: Not on file    Non-medical: Not on file  Tobacco Use  . Smoking status: Former Smoker    Packs/day: 1.00    Years: 50.00    Pack years: 50.00    Types: Cigarettes    Last attempt to quit: 11/05/2014    Years since quitting: 3.1  . Smokeless tobacco: Never Used  . Tobacco comment: Quit April 2016  Substance and Sexual Activity  . Alcohol use: No    Alcohol/week: 0.0 oz  . Drug use: No  . Sexual activity: Not on file  Lifestyle  . Physical activity:    Days per week: Not on file    Minutes per session: Not on file  . Stress: Not on file  Relationships  . Social connections:    Talks on phone: Not on file    Gets together: Not on file    Attends religious service: Not on file    Active member of club or organization: Not on file    Attends meetings of clubs or organizations: Not on file    Relationship status: Not  on file  Other Topics Concern  . Not on file  Social History Narrative  . Not on file    ECOG Status: 2 - Symptomatic, <50% confined to  bed  Review of Systems: A 12 point ROS discussed and pertinent positives are indicated in the HPI above.  All other systems are negative.  Review of Systems  Vital Signs: BP (!) 95/50   Pulse 61   Temp 98 F (36.7 C) (Oral)   Resp 14 Comment: O2 at 2L/nasal cannula  Ht 5' 2.5" (1.588 m)   Wt 178 lb (80.7 kg)   SpO2 91%   BMI 32.04 kg/m   Physical Exam  Constitutional: Examined in wheelchair.  Oriented to person, place, and time. Well-developed and well-nourished. No distress.  HENT:  Head: Normocephalic and atraumatic.  Eyes: Conjunctivae and EOM are normal. Right eye exhibits no discharge. Left eye exhibits no discharge. No scleral icterus.  Neck: No JVD present.  Pulmonary/Chest: Effort normal. No stridor. No respiratory distress.  Abdomen: soft, non distended Neurological:  alert and oriented to person, place, and time.  Skin: Skin is warm and dry.  not diaphoretic. Bilateral lower extremity edema. Psychiatric:   normal mood and affect.   behavior is normal. Judgment and thought content normal.      Imaging: Mr Abdomen Wwo Contrast  Result Date: 12/11/2017 CLINICAL DATA:  Hepatocellular carcinoma x2, status post ablation 08/30/2017. EXAM: MRI ABDOMEN WITHOUT AND WITH CONTRAST TECHNIQUE: Multiplanar multisequence MR imaging of the abdomen was performed both before and after the administration of intravenous contrast. CONTRAST:  46m MULTIHANCE GADOBENATE DIMEGLUMINE 529 MG/ML IV SOLN COMPARISON:  CT scan 09/07/2017.  MRI 06/26/19 18. FINDINGS: Lower chest: Pleural effusions noted on 09/07/2017 have nearly resolved. Hepatobiliary: 6.4 x 5.6 cm ablation defect identified in the lateral segment of the left liver near the junction of segments II and III. This has scattered areas of T1 shortening compatible with high proteinaceous content or hemorrhagic debris. Postcontrast imaging shows no early arterial or delayed phase imaging within the ablation defect, best demonstrated on  subtraction sequences. A second ablation defect is identified in the posterior right liver (segment VI) this defect measures 3.2 x 4.2 cm and occupies the position of the hypervascular lesions seen at this location previously. No definite hypervascular enhancement. High signal intensity is seen along the inferior margin of the ablation defect on precontrast T1 weighted imaging suggesting proteinaceous debris or hemorrhage. No new hypervascular lesion within the liver parenchyma on today's study. Persistent nodular liver contour compatible with cirrhosis. Pancreas: Motion degraded assessment with similar fullness in the head of pancreas. No dilatation of the main duct. Spleen:  Spleen measures 16.1 cm craniocaudal length. Adrenals/Urinary Tract: No adrenal nodule or mass. Similar fullness of the right intrarenal collecting system. 2.2 cm cyst in the interpolar left kidney is stable. Complex cyst seen previously in the lower pole left kidney has decreased in size in the interval. Stomach/Bowel: Stomach is nondistended. No gastric wall thickening. No evidence of outlet obstruction. Duodenum is normally positioned as is the ligament of Treitz. No small bowel or colonic dilatation. Vascular/Lymphatic: No abdominal aortic aneurysm. No evidence for abdominal lymphadenopathy. Portal vein is patent. Recanalization of the paraumbilical vein noted. Tiny paraesophageal varices seen on previous CT are not well demonstrated today given motion artifact. Other:  Small volume intraperitoneal free fluid evident. Musculoskeletal: Status post lumbar fusion. No abnormal marrow enhancement within the visualized bony anatomy. IMPRESSION: 1. Motion degraded study shows ablation defects in the left  and right hepatic lobes, corresponding to the hypervascular lesions seen on previous MRI. No abnormal enhancement in either of lesion defect to suggest residual/recurrent disease. No new hypervascular liver lesion on today's study. 2. Cirrhosis  with sequelae of portal venous hypertension. 3. Small volume ascites Electronically Signed   By: Misty Stanley M.D.   On: 12/11/2017 14:33   Ir Abdomen US Limited  Result Date: 11/20/2017 CLINICAL DATA:  72 year old female with abdominal distension presents for ultrasound-guided paracentesis. EXAM: LIMITED ABDOMEN ULTRASOUND FOR ASCITES TECHNIQUE: Limited ultrasound survey for ascites was performed in all four abdominal quadrants. COMPARISON:  Prior paracentesis 09/22/2017 FINDINGS: Sonographic interrogation of the 4 quadrants of the abdomen demonstrates no intra-abdominal ascites at this time. IMPRESSION: 1. No evidence of ascites sonographically. Paracentesis was not performed. Electronically Signed   By: Jacqulynn Cadet M.D.   On: 11/20/2017 10:15   Ir Radiologist Eval & Mgmt  Result Date: 12/11/2017 Please refer to notes tab for details about interventional procedure. (Op Note)   Labs:  CBC: Recent Labs    10/03/17 1645 10/18/17 1559 11/14/17 1511 12/05/17 1519  WBC 3.8 4.1 3.3* 4.5  HGB 8.5* 9.7* 8.8* 9.0*  HCT 26.1* 29.2* 28.8* 27.3*  PLT 89* 98.0* 85* 94*    COAGS: Recent Labs    07/18/17 1135 08/27/17 1457  09/17/17 0708 09/23/17 0542 11/14/17 1511 12/05/17 1519  INR 1.10 1.10   < > 1.23 1.24 1.11 1.0  APTT 32 34  --   --   --   --   --    < > = values in this interval not displayed.    BMP: Recent Labs    09/28/17 1025 10/03/17 1645 10/18/17 1559 11/14/17 1511 12/05/17 1519 12/10/17 1533  NA 132* 137 135 140 140 139  K 3.7 4.0 4.3 4.1 2.9* 2.9*  CL 86* 94* 96 104 97* 94*  CO2 33* 36* 35* 28 37* 38*  GLUCOSE 134* 82 111* 124 87 141*  BUN 30* 16 20 27* 28* 34*  CALCIUM 8.5* 9.0 9.2 8.7 8.9 8.9  CREATININE 1.45* 1.41* 1.20 1.36* 1.34* 1.27*  GFRNONAA 36* 38*  --  38* 40*  --   GFRAA 41* 44*  --  44* 46*  --     LIVER FUNCTION TESTS: Recent Labs    09/25/17 0304  10/18/17 1559 11/14/17 1511 12/05/17 1519 12/10/17 1533  BILITOT 1.0   < > 1.2  0.9 1.1 0.9  AST 24   < > 28 22 22 25   ALT 21   < > 23 14 15 14   ALKPHOS 131*  --  144* 141  --  111  PROT 4.8*   < > 5.7* 5.6* 5.2* 5.3*  ALBUMIN 2.5*  --  3.0* 2.6*  --  2.9*   < > = values in this interval not displayed.    TUMOR MARKERS: Recent Labs    12/11/16 1602 05/31/17 1257 12/05/17 1519  AFPTM 5.1 5.5 3.3    Assessment and Plan:  My impression is that she is done well post percutaneous microwave ablation of synchronous/multicentric hepatocellular carcinoma .  Her CHF which flared post procedure is now under fairly good medical control.  There has been resolution of both the ascites and a right pleural effusion.  Still some leg swelling.  No evidence of residual/recurrent disease on MRI.  Ablation defects as anticipated.  No unexpected findings.  I reviewed the MRI with the patient and her spouse.  They were happy with the  results as  Was I.  We will plan to put her on a surveillance schedule with another liver MRI with contrast in 3 months, clinic visit to follow.  She knows to call with any interval questions or problems.  Thank you for this interesting consult.  I greatly enjoyed meeting [name] and look forward to participating in their care.  A copy of this report was sent to the requesting provider on this date.  Electronically Signed: Rickard Rhymes 12/11/2017, 3:58 PM   I spent a total of    15 Minutes in face to face in clinical consultation, greater than 50% of which was counseling/coordinating care for multicentric hepatocellular carcinoma, status post percutaneous ablation .

## 2017-12-12 ENCOUNTER — Telehealth: Payer: Self-pay

## 2017-12-12 NOTE — Telephone Encounter (Signed)
-----   Message from Truitt Merle, MD sent at 12/12/2017  8:35 AM EDT ----- Please let pt know the scan result, good news, no concerns, thanks   Truitt Merle  12/12/2017

## 2017-12-12 NOTE — Telephone Encounter (Signed)
Called patient spoke with her husband informed scan looks good, no concerns per Dr. Burr Medico.    He verbalized an understanding of results.

## 2017-12-16 ENCOUNTER — Encounter: Payer: Self-pay | Admitting: Internal Medicine

## 2017-12-16 ENCOUNTER — Ambulatory Visit (INDEPENDENT_AMBULATORY_CARE_PROVIDER_SITE_OTHER): Payer: PPO | Admitting: Internal Medicine

## 2017-12-16 VITALS — BP 96/54 | HR 64 | Temp 97.7°F | Resp 18 | Ht 62.0 in | Wt 180.0 lb

## 2017-12-16 DIAGNOSIS — R188 Other ascites: Secondary | ICD-10-CM | POA: Diagnosis not present

## 2017-12-16 DIAGNOSIS — K7460 Unspecified cirrhosis of liver: Secondary | ICD-10-CM

## 2017-12-16 DIAGNOSIS — I872 Venous insufficiency (chronic) (peripheral): Secondary | ICD-10-CM | POA: Diagnosis not present

## 2017-12-16 DIAGNOSIS — K7581 Nonalcoholic steatohepatitis (NASH): Secondary | ICD-10-CM

## 2017-12-16 DIAGNOSIS — I10 Essential (primary) hypertension: Secondary | ICD-10-CM

## 2017-12-16 NOTE — Progress Notes (Signed)
Subjective:    Patient ID: [name], female    DOB: 05/06/1946, 72 y.o.   MRN: [id number]  HPI  Patient is a 72 yo MWF with multiple end stage co-morbidities including O2 Dependent COPD, HTN, NASH w/Cirrhosis and portal HTN w/ hx/o Hepatic Encephalopathy and now Recent RFA of #2 hepatomas in Jan 2019.    Patient was treated with Keflex x 10 days on 11/25/17 and Venous dopplers ruled out DVT.  When seen in f/u on 12/05/17 her cellulitis seemed resolved and she was continued on her aggressive diuretic regimen. Labs then found a severe hypokalemia (K 2.9) and she was seen in the interim by Dr Havery Moros, who felt her stasis dermatitis/cellulitis was relapsing and he re-treated with Augmentin and Doxycycline.      Dr Havery Moros also adjusted her diuretics to add Aldactone in anticipation of treating her Low K along with her KCL supplement and he has f/u electrolytes ordered. Patient also reports she has finally actively attempting elevating her LE more aggressively and she feels her dependent edema has improved.    Medication Sig  . albuterol  HFA   inhaler 2 puffs  every 6  hours as needed for wheezing or shortness of breath.  . allopurinol  300 MG tablet take 1 tab daily with supper - for gout  . ALPRAZolam  0.25 MG tablet Take 1 tab as needed for anxiety, cramping, air hunger every 8 hours as needed.  Marland Kitchen aspirin EC 81 MG tablet Take  at bedtime.  . bisoprolol  10 MG tablet Take 1 tab daily.  . budesonide 0.25 MG/2ML neb soln Take 2 mLs  by neb 2  times daily.  Marland Kitchen VITAMIN D 5000 units  Take  daily with breakfast.  . citalopram20 MG tablet Take 1 tab daily with supper.  . cyclobenzaprine 10 MG tablet Take 1 tab 3  times daily as needed for muscle spasms.  . ferrous sulfate 325 MG tablet Take  daily.   Marland Kitchen gabapentin 400 MG capsule Take 1 cap 3  times daily.  Marland Kitchen ipratropium  0.02 % neb  soln use 1 vial in neb EVERY 4 HRS AS NEEDED   . lactulose  10 GM/15ML solution Take 10 g by mouth daily. )  .  Magnesium 250 MG TABS Take 250 mg by mouth at bedtime.   Marland Kitchen nystatin cream  Apply 1 application topically 2 (two) times daily.   . ondansetron  4 MG tablet Take 1 tab every 6  hrs as needed for nausea or vomiting.  . OXYGEN Inhale 2-3 L into the lungs continuous. 3 L with exertion  . pantoprazole  40 MG Take 1 tab daily  . K-DUR 10 MEQ  Take 2 tablet 3 times a day.  . spironolactone 25 MG tablet Take 2 tablets every day   . tTERAZOL 7 0.4 % vaginal cream  Place 1 appl vaginally at bedtime as needed  . torsemide  20 MG tablet Take 3 tablets 2 x/ day  Morning & Nite for fluid Retention & Swelling  . traMADol  50 MG tablet Take every 12 hours as needed for pain for chronic pain  . triamcinolone cream  0.1 % APPLY TOPICALLY 3  TIMES A DAY AS NEEDED   Allergies  Allergen Reactions  . Atorvastatin Other (See Comments)    Unknown reaction  . Diphenhydramine Hcl (Sleep) Hives  . Hydrocodone-Acetaminophen Other (See Comments)    Unknown reaction  . Lopid [Gemfibrozil] Other (See Comments)  Unknown reaction  . Loratadine Hives  . Lorazepam Hives  . Simvastatin Other (See Comments)    Unknown reaction  . Sulfamethoxazole Hives  . Sulfonamide Derivatives Hives   Past Medical History:  Diagnosis Date  . Asthma   . Cancer [facility])    liver cancer   . COPD (chronic obstructive pulmonary disease) ([place])    x 3 years   . Depression   . Diabetes mellitus without complication ([place])    diet controlled  not on meds   . Edema    left leg   . Esophageal varices ([place])   . Fibromyalgia   . Gastritis   . GERD (gastroesophageal reflux disease)   . Hepatic cirrhosis ([place])   . Hepatic encephalopathy ([place])   . History of blood transfusion   . Hyperlipidemia   . Hypertension   . IBS (irritable bowel syndrome)   . Phlebitis    left leg x 2   . Pneumonia    hx of   . Splenomegaly   . Vitamin D deficiency    Past Surgical History:  Procedure Laterality Date  . ABDOMINAL HYSTERECTOMY    . BACK  SURGERY    . CATARACT EXTRACTION, BILATERAL    . CHOLECYSTECTOMY    . ESOPHAGOGASTRODUODENOSCOPY  07/16/2012   Procedure: ESOPHAGOGASTRODUODENOSCOPY (EGD);  Surgeon: Inda Castle, MD;  [facility];  Service: Endoscopy;  Laterality: N/A;  . GASTRIC VARICES BANDING  07/16/2012   Procedure: GASTRIC VARICES BANDING;  Surgeon: Inda Castle, MD;  [facility];  Service: Endoscopy;  Laterality: N/A;  . HIP ARTHROPLASTY Bilateral   . IR GENERIC HISTORICAL  06/25/2016   IR RADIOLOGIST EVAL & MGMT 06/25/2016 [facility]  . IR GENERIC HISTORICAL  06/27/2016   IR KYPHO LUMBAR INC FX REDUCE BONE BX UNI/BIL CANNULATION INC/IMAGING 06/27/2016 Luanne Bras, MD [facility]  . IR GENERIC HISTORICAL  07/17/2016   IR RADIOLOGIST EVAL & MGMT 07/17/2016 [facility]  . IR RADIOLOGIST EVAL & MGMT  07/10/2017  . IR RADIOLOGIST EVAL & MGMT  10/01/2017  . IR RADIOLOGIST EVAL & MGMT  12/11/2017  . IR THORACENTESIS ASP PLEURAL SPACE W/IMG GUIDE  09/12/2017  . IR THORACENTESIS ASP PLEURAL SPACE W/IMG GUIDE  09/17/2017  . NECK SURGERY    . RADIOFREQUENCY ABLATION N/A 08/30/2017   Procedure: CT MICROWAVE THERMAL ABLATION;  Surgeon: Arne Cleveland, MD;  [facility];  Service: Anesthesiology;  Laterality: N/A;  . SHOULDER SURGERY Right    10 point systems review negative except as above.    Objective:   Physical Exam   BP (!) 96/54   Pulse 64   Temp 97.7 F (36.5 C)   Resp 18   Ht 5' 2"  (1.575 m)   Wt 180 lb (81.6 kg)   BMI 32.92 kg/m   O2 sat on  2 L/M nasal is 94-96% at rest.    HEENT - WNL. Neck - supple.  Chest - Clear equal BS. Exquisite tenderness of the lower anterior ribs.  Cor - Nl HS. RRR w/o sig MGR. PP 1(+).  Chronic brawny edema to the prox shins bilat.w/o cellulitis evident and PP obscured by her pachyderma.  Abd - protuberant, soft /doughy w/o point tenderness or guarding.  MS- FROM w/o deformities.   Non ambulatory & in wheelchair.  Neuro -  Nl w/o  focal abnormalities. Flat affect w/o liver flap.    Assessment & Plan:   1. Liver cirrhosis secondary to nonalcoholic steatohepatitis (NASH) ([place])  2. Essential hypertension  3. Other ascites  4. Venous insufficiency  - Discussed meds/ SE's and deferred Labs to Dr Doyne Keel labs ordered.

## 2017-12-17 ENCOUNTER — Encounter: Payer: Self-pay | Admitting: Podiatry

## 2017-12-24 DIAGNOSIS — Z961 Presence of intraocular lens: Secondary | ICD-10-CM | POA: Diagnosis not present

## 2017-12-24 DIAGNOSIS — H26493 Other secondary cataract, bilateral: Secondary | ICD-10-CM | POA: Diagnosis not present

## 2017-12-24 DIAGNOSIS — E119 Type 2 diabetes mellitus without complications: Secondary | ICD-10-CM | POA: Diagnosis not present

## 2017-12-24 LAB — HM DIABETES EYE EXAM

## 2017-12-25 ENCOUNTER — Encounter: Payer: Self-pay | Admitting: Internal Medicine

## 2017-12-25 ENCOUNTER — Ambulatory Visit (INDEPENDENT_AMBULATORY_CARE_PROVIDER_SITE_OTHER)
Admission: RE | Admit: 2017-12-25 | Discharge: 2017-12-25 | Disposition: A | Discharge status: Home / Self Care | Payer: PPO | Source: Ambulatory Visit | Attending: Internal Medicine | Admitting: Internal Medicine

## 2017-12-25 ENCOUNTER — Ambulatory Visit: Payer: PPO | Admitting: Internal Medicine

## 2017-12-25 VITALS — BP 126/70 | HR 63 | Ht 62.0 in | Wt 177.0 lb

## 2017-12-25 DIAGNOSIS — J449 Chronic obstructive pulmonary disease, unspecified: Secondary | ICD-10-CM | POA: Diagnosis not present

## 2017-12-25 DIAGNOSIS — J9611 Chronic respiratory failure with hypoxia: Secondary | ICD-10-CM

## 2017-12-25 DIAGNOSIS — J9612 Chronic respiratory failure with hypercapnia: Secondary | ICD-10-CM

## 2017-12-25 DIAGNOSIS — J9610 Chronic respiratory failure, unspecified whether with hypoxia or hypercapnia: Secondary | ICD-10-CM | POA: Diagnosis not present

## 2017-12-25 DIAGNOSIS — J441 Chronic obstructive pulmonary disease with (acute) exacerbation: Secondary | ICD-10-CM | POA: Diagnosis not present

## 2017-12-25 DIAGNOSIS — I509 Heart failure, unspecified: Secondary | ICD-10-CM | POA: Diagnosis not present

## 2017-12-25 MED ORDER — GLYCOPYRROLATE-FORMOTEROL 9-4.8 MCG/ACT IN AERO: 2.0000 | INHALATION_SPRAY | Freq: Two times a day (BID) | RESPIRATORY_TRACT | 11 refills | Status: AC

## 2017-12-25 NOTE — Progress Notes (Signed)
Subjective:    Patient ID: [name], female    DOB: 08-08-1945,    MRN: [id number]   Brief patient profile:  72 yowf with sob x 2014 dx as copd > quit smoking 11/05/14 at wt 160  Did some  better with chronic doe  but still needed 3lpm 24/7 then worse around tgiving 2016 and downhill since then so referred to pulmonary clinic 08/29/2015 by Dr Franne Forts    History of Present Illness  08/29/2015 1st [facility] office visit/ Nichola Warren  maint duoneb tid/ 02 3lpm 24/7  Chief Complaint  Patient presents with  . Pulmonary Consult    Referred for chronic bronchitis. Recent labs/cxr.  present doe x across the room but also limited by back pain and leg weakness chronically/ mostly c/w bound/ duoneb helps some Cough worse in am but no excess/ purulent sputum or mucus plugs rec Stop corgard and start bisoprolol  10 mg daily  Be sure you take pantoprazole 40 mg Take 30- 60 min before your first and last meals of the day  GERD diet. Nebulizer ok to use up to four times daily    11/04/2015  f/u ov/Andrena Margerum re: GOLD III copd / 02 dep Chief Complaint  Patient presents with  . Follow-up    Review PFT today. Pt reports having increased exertional SOB wihtout O2 - pt reports not using her O2 "all the time" at home.   feeling much better when uses 02 vs anything she's used before    No more cough on gerd rx/ rare need for prn atrovent  rec 02 3lpm 24/7 Only use your albuterol/atrovent neb  as a rescue medication     Admit date: 09/16/2017 Discharge date: 09/28/2017  Time spent: 35 minutes  Recommendations for Outpatient Follow-up:   Acute on Chronic Respiratory Failure with Hypoxia -discharged on 2/8 On 4L O2 via [place]. Currently on 5 L O2 via [place] at rest -Multifactorial COPD exacerbation, positiveRSV pneumonia, HCAP, recurrent RIGHT pleural effusion, ascites.  -Titrate O2 to maintain SPO2 89-93%  -CPAP/BiPAP QHS PER RESPIRATORY TO MAINTAIN SPO2 89 -93%  -See RSV pneumonia  COPD  exacerbation -See respiratory failure See respiratory failure  OSA/OHS -CPAP per respiratory -Patient requires Trilogy prior to discharge. Patient at high risk of early readmission without Trilogy machine. Unlikely remain out of the hospital long enough for sleep study without machine. -Titrate O2 to maintain SPO2 89-93% -Patient continues to exhibit signs of hypercapnia associated with chronic respiratory failure secondary to severe COPD. Patient requires the use ofNIV both QHS and daytime to help with exacerbation periods. The use of theNIV Will treat patient's high PCO2 levels and can reduce risk of exacerbations and future hospitalizations when used at night and during the day. Patient will need these advanced settings in conjunction with her current medication regimen: BiPAP is not an option due to his functional limitations and the severity of the patient's condition. Failure to haveNIV available for use over 24 hour period could lead to death.  RLL pneumonia (HCAP)//positiveRSV pneumonia -2/13 PCXR consistent with RLL pneumonia. Although patient negative leukocytosis patient is immunocompromised with HCC/liver failure may not mount immune response. Given that second hospitalization within 7 days. Start pneumonia protocol. 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 -S/P RIGHT Thoracentesis: 900 ml aspirated.Pathologynegativefor malignant cells./culture and stain pending -GI DOES NOT recommend TIPS AT THIS TIME.  ON 2/17 SIGNED OFF.  -Pathology: No malignant cells  Nonalcoholic Liver Cirrhosis /Nash/Hepatocellular carcinoma -Check ammonia level sporadically to ensure not becoming elevated.  -Lactulose 10 g daily -Follow-up with PAAmy Esterwoodon 3/1@1430 .Dr. Remo Lipps  [facility]/Paracentesis will most likely requireq2-3 week outpatient appointments. Recurrent Pleural effusion, ascites secondary to [facility], hepatocellular carcinoma  Ascites -2/17 S/Pparacentesis 2.6 L  Liver lesions(2)suspicious for HCC.  -OncologistDr. Truitt Merle per EMR lastsaw patient 07/10/2017 -08/30/2017 S/PCT guided Percutaneous Radiofrequency (MICROWAVE) Ablation of segment 6 Hepatic lesion -Per oncology note not candidate for surgery for liver transplantsecondary to comorbidities -Per oncology patient should follow-up in 6-12 months post IR procedure.   Esophageal Varices, Hepatic Encephalopathy(Child Pugh score 6 (class A) -f/u with Dr. Ambrusterseenonalcoholic liver cirrhosis.  Right neck mass -Patient with new onset C-spine/right soft tissue swelling. X-ray shows negative discrete soft tissue abnormality.  Chronic diastolic CHF (base weight 84.5 kg at discharge on 2/8) -Strict in and out since admission-3.3 L -Daily weight      Filed Weights   09/26/17 0432 09/27/17 0606 09/28/17 1696  Weight: 192 lb 0.3 oz (87.1 kg) 199 lb 1.2 oz (90.3 kg) 182 lb 8.7 oz (82.8 kg)  -home dose Torsemide -Patient to hold home Zaroxolyn until seen by PCP who will restart if appropriate. Patient's creatinine currently slightly above baseline. -2/22 PCXR pending  Macrocytic anemia -B-12/folate pending  Macrocytic anemia -Check V89 and folic acid  CKD stage III (baseline Cr 1.3) -Follow Cr closely with diuretics LastLabs          Recent Labs  Lab 09/22/17 0250 09/23/17 0542 09/24/17 0218 09/25/17 0304 09/27/17 1456 09/28/17 1025  CREATININE 1.32* 1.30* 1.31* 1.36* 1.38* 1.45*    -See CHF  Obesity -Body mass index is 36.09 kg/m    Discharge Diagnoses:  Principal Problem:   Acute on chronic respiratory failure with hypoxemia (HCC) Active Problems:   Anemia   Cirrhosis (HCC)   Essential hypertension    Goals of care, counseling/discussion   Chronic respiratory failure (HCC)   Obesity (BMI 30-39.9)   COPD exacerbation (HCC)   Anasarca   Hepatocellular carcinoma (HCC)   Ascites   Pleural effusion   Palliative care by specialist   Hypoxia   Discharge Condition: Guarded  Diet recommendation: Heart healthy fluid consistency thin       Filed Weights   09/26/17 0432 09/27/17 0606 09/28/17 3810  Weight: 192 lb 0.3 oz (87.1 kg) 199 lb 1.2 oz (90.3 kg) 182 lb 8.7 oz (82.8 kg)    History of present illness:  72 y.o.WF PMHxDepression,Diabetes mellitus without complication (diet controlled)COPD on 2-3 L O2 at home, non-alcohol relatedliver cirrhosis, Esophageal varices,Hepatic encephalopathy , and Hepatocellular Carcinoma diagnosedNov 2018 s/p CT guided ablationwith hospitalization 2/2 >>> 2/8/19due toanasarca and Acute on Chronic Respiratory failure s/p thoracentesis Asthma, Fibromyalgia, HLD, HTN, IBS,Splenomegaly, Vitamin D deficiency whoreturned to the ED 2/11with recurrent respiratory failure. CXR showed re-accumulation ofaright-sided pleural effusion.Marland Kitchen   Hospital Course:   Procedures: 2/8 d/c 2/11 re-admit w/ rapidly re-accumulated pleural effusion  2/12 S/P RIGHT Thoracentesis900 ml aspirated 2/17 paracentesisin Radiology - 2.6L     Consultations: IR PALLIATIVE [facility]care    Cultures  2/11 MRSA by PCR negative 2/12 RIGHT pleural fluidnegativemalignant cells: Culture pending 2/13 respiratory virus panel pending 2/13 sputum pending 2/13 HIV negative 2/13 strep pneumo pending 2/13 respiratory virus panel positive RSV     12/25/2017 acute extended ov/Clotilde Loth re: re establish  re   copd / 02 2 at rest and 3lpm  ex  Chief Complaint  Patient presents with  . Acute Visit    Pt c/o increased DOE x 4 wks. She states she gets out of breath just walking from room to room at home. She has an albuterol inhaler that she never uses but she uses an  unknown neb med 1-2 x daily.   breathing worse x 4 weeks assoc with leg swelling  Sees [facility] (armbruster)  Last used neb one week prior to OV   No cough  Hob 30 degrees on 2lpm on bipap ever since d/c    No obvious day to day or daytime variability or assoc excess/ purulent sputum or mucus plugs or hemoptysis or cp or chest tightness, subjective wheeze or overt sinus or hb symptoms. No unusual exposure hx or h/o childhood pna/ asthma or knowledge of premature birth.  Sleeping  As above ok on present bipap setting   without nocturnal  or early am exacerbation  of respiratory  c/o's or need for noct saba. Also denies any obvious fluctuation of symptoms with weather or environmental changes or other aggravating or alleviating factors except as outlined above   Current Allergies, Complete Past Medical History, Past Surgical History, Family History, and Social History were reviewed in [employer] record.  ROS  The following are not active complaints unless bolded Hoarseness, sore throat, dysphagia, dental problems, itching, sneezing,  nasal congestion or discharge of excess mucus or purulent secretions, ear ache,   fever, chills, sweats, unintended wt loss or wt gain, classically pleuritic or exertional cp,  orthopnea pnd or arm/hand swelling  or leg swelling, presyncope, palpitations, abdominal pain, anorexia, nausea, vomiting, diarrhea  or change in bowel habits or change in bladder habits, change in stools or change in urine, dysuria, hematuria,  rash, arthralgias, visual complaints, headache, numbness, weakness or ataxia or problems with walking or coordination,  change in mood or  memory.        Current Meds  Medication Sig  . albuterol (PROVENTIL HFA;VENTOLIN HFA) 108 (90 Base) MCG/ACT inhaler Inhale 2 puffs into the lungs every 6 (six) hours as needed for wheezing or shortness of breath.  . allopurinol (ZYLOPRIM) 300 MG tablet take 1 tablet (300 mg) by mouth  daily with supper - for gout  . ALPRAZolam (XANAX) 0.25 MG tablet Take 1 tab as needed for anxiety, cramping, air hunger every 8 hours as needed.  Marland Kitchen aspirin EC 81 MG tablet Take 81 mg by mouth at bedtime.  . bisoprolol (ZEBETA) 10 MG tablet Take 1 tablet (10 mg total) by mouth daily.  . Cholecalciferol (VITAMIN D3) 5000 units CAPS Take 5,000 Units by mouth daily with breakfast.  . citalopram (CELEXA) 20 MG tablet Take 1 tablet (20 mg total) by mouth daily with supper.  . cyclobenzaprine (FLEXERIL) 10 MG tablet Take 1 tablet (10 mg total) by mouth 3 (three) times daily as needed for muscle spasms.  . ferrous sulfate 325 (65 FE) MG tablet Take 325 mg by mouth daily.   Marland Kitchen gabapentin (NEURONTIN) 600 MG tablet Take 600 mg by mouth 3 (three) times daily.  Marland Kitchen lactulose (CHRONULAC) 10 GM/15ML solution take 2ms TWICE DAILY (Patient taking differently: Take 10 g by mouth daily. )  . Magnesium 250 MG TABS Take 250 mg by mouth at bedtime.   . ondansetron (ZOFRAN) 4 MG tablet Take 1 tablet (4 mg total) by mouth every 6 (six) hours as needed for nausea or vomiting.  . OXYGEN Inhale 2-3 L  into the lungs continuous. 3 L with exertion  . potassium chloride (K-DUR) 10 MEQ tablet Take 2 tablet 3 times a day.  . rifampin (RIFADIN) 300 MG capsule Take 1 capsule by mouth 2 (two) times daily.  Marland Kitchen torsemide (DEMADEX) 20 MG tablet Take 3 tablets 2 x/ day  Morning & Nite for fluid Retention & Swelling (Patient taking differently: 1 tablet twice daily)  . traMADol (ULTRAM) 50 MG tablet Take 50 mg by mouth every 12 hours as needed for pain for chronic pain                       Objective:   Physical Exam  W/c bound elderly wf nad chewing mint gum   11/04/2015        177   08/29/15 180 lb 6.4 oz (81.829 kg)  08/16/15 179 lb 6.4 oz (81.375 kg)  08/09/15 174 lb 9.7 oz (79.2 kg)      Vital signs reviewed - Note on arrival 02 sats  97% on 2lpm pulsed     HEENT: nl dentition, turbinates bilaterally, and  oropharynx. Nl external ear canals without cough reflex   NECK :  without JVD/Nodes/TM/ nl carotid upstrokes bilaterally   LUNGS: no acc muscle use,  Nl contour chest with distant bs bilaterally  s wheeze    CV:  RRR  no s3 or murmur or increase in P2, and no edema    ABD:  Very tensely distended but  nontender with no inspiratory excursion  . No bruits or organomegaly appreciated, bowel sounds nl  MS:   ext warm without deformities, calf tenderness, cyanosis or clubbing No obvious joint restrictions   SKIN: warm and dry without lesions    NEURO:  alert, approp, nl sensorium with  no motor or cerebellar deficits apparent.          CXR PA and Lateral:   12/25/2017 :    I personally reviewed images and agree with radiology impression as follows:   No significant effusion/ mild increase markings         Assessment & Plan:

## 2017-12-25 NOTE — Patient Instructions (Signed)
Plan A = Automatic = Bevespi . Take 2 puffs first thing in am and then another 2 puffs about 12 hours later.    Work on inhaler technique:  relax and gently blow all the way out then take a nice smooth deep breath back in, triggering the inhaler at same time you start breathing in.  Hold for up to 5 seconds if you can. Blow out thru nose. Rinse and gargle with water when done      Plan B = Backup Only use your albuterol as a rescue medication to be used if you can't catch your breath by resting or doing a relaxed purse lip breathing pattern.  - The less you use it, the better it will work when you need it. - Ok to use the inhaler up to 2 puffs  every 4 hours if you must but call for appointment if use goes up over your usual need - Don't leave home without it !!  (think of it like the spare tire for your car)   Plan C = Crisis - only use your albuterol nebulizer if you first try Plan B and it fails to help > ok to use the nebulizer up to every 4 hours but if start needing it regularly call for immediate appointment    Please remember to go to the  x-ray department downstairs in the basement  for your tests - we will call you with the results when they are available.     Please schedule a follow up office visit in 6 weeks, call sooner if needed with all medications /inhalers/ solutions in hand so we can verify exactly what you are taking. This includes all medications from all doctors and over the counters

## 2017-12-26 ENCOUNTER — Encounter: Payer: Self-pay | Admitting: Internal Medicine

## 2017-12-26 DIAGNOSIS — J9611 Chronic respiratory failure with hypoxia: Secondary | ICD-10-CM | POA: Insufficient documentation

## 2017-12-26 DIAGNOSIS — J9612 Chronic respiratory failure with hypercapnia: Secondary | ICD-10-CM

## 2017-12-26 NOTE — Assessment & Plan Note (Addendum)
Quit smoking 2016 and placed on 02 initially at wt 160  Trial off corgard 08/29/2015 >>> - .PFT's  11/04/2015  FEV1 0.90 (42 % ) ratio 55  p 20 % improvement from saba p no rx prior to study  - Spirometry 12/25/2017  FEV1 0.53 (26%)  Ratio 53   - 12/25/2017  After extensive coaching inhaler device  effectiveness =    75% from a baseline of 25% - Bevespi trial 12/25/2017 >>>  Most likely has restrictive changes from ascites/ obesity that are contributing to low vol/flows but clearly at this point Pt is Group B in terms of symptom/risk and laba/lama therefore appropriate rx at this point.   Other options include anoro and stiolto depending on insurance.  Formulary restrictions will be an ongoing challenge for the forseable future and I would be happy to pick an alternative if the pt will first  provide me a list of them but pt  will need to return here for training for any new device that is required eg dpi vs hfa vs respimat.    In meantime we can always provide samples so the patient never runs out of any needed respiratory medications.    I had an extended discussion with the patient reviewing all relevant studies completed to date and  lasting 25 minutes of a 40  minute transition of care office  visit to re-establish     re  severe non-specific but potentially very serious refractory respiratory symptoms of uncertain and potentially multiple  etiologies.  See device teaching which extended face to face time for this visit   Each maintenance medication was reviewed in detail including most importantly the difference between maintenance and prns and under what circumstances the prns are to be triggered using an action plan format that is not reflected in the computer generated alphabetically organized AVS.    Please see AVS for specific instructions unique to this office visit that I personally wrote and verbalized to the the pt in detail and then reviewed with pt  by my nurse highlighting any  changes in therapy/plan of care  recommended at today's visit.

## 2017-12-26 NOTE — Assessment & Plan Note (Addendum)
bipap dep noct since  09/28/17 and 02 at 2lpm at rest, 3lpm with activity - HCO3  12/10/17  = 37   Goal is to keep sats > 90% but no need to push higher due to tendency to hypercarbia.

## 2017-12-26 NOTE — Progress Notes (Signed)
Spoke with the pt's spouse and notified of results/recs and he verbalized understanding and will inform the pt.

## 2018-01-15 ENCOUNTER — Ambulatory Visit (INDEPENDENT_AMBULATORY_CARE_PROVIDER_SITE_OTHER): Payer: PPO | Admitting: Adult Health

## 2018-01-15 ENCOUNTER — Encounter: Payer: Self-pay | Admitting: Adult Health

## 2018-01-15 VITALS — BP 124/54 | HR 78 | Temp 97.5°F | Ht 62.0 in | Wt 173.0 lb

## 2018-01-15 DIAGNOSIS — R110 Nausea: Secondary | ICD-10-CM

## 2018-01-15 DIAGNOSIS — I5032 Chronic diastolic (congestive) heart failure: Secondary | ICD-10-CM | POA: Diagnosis not present

## 2018-01-15 DIAGNOSIS — K7460 Unspecified cirrhosis of liver: Secondary | ICD-10-CM | POA: Diagnosis not present

## 2018-01-15 DIAGNOSIS — K7581 Nonalcoholic steatohepatitis (NASH): Secondary | ICD-10-CM | POA: Diagnosis not present

## 2018-01-15 DIAGNOSIS — R42 Dizziness and giddiness: Secondary | ICD-10-CM

## 2018-01-15 NOTE — Patient Instructions (Signed)
Please hold off on metalozone, give only 1/2 tab at a time if needed, call office right away with any concerns   Dehydration, Adult Dehydration is a condition in which there is not enough fluid or water in the body. This happens when you lose more fluids than you take in. Important organs, such as the kidneys, brain, and heart, cannot function without a proper amount of fluids. Any loss of fluids from the body can lead to dehydration. Dehydration can range from mild to severe. This condition should be treated right away to prevent it from becoming severe. What are the causes? This condition may be caused by:  Vomiting.  Diarrhea.  Excessive sweating, such as from heat exposure or exercise.  Not drinking enough fluid, especially: ? When ill. ? While doing activity that requires a lot of energy.  Excessive urination.  Fever.  Infection.  Certain medicines, such as medicines that cause the body to lose excess fluid (diuretics).  Inability to access safe drinking water.  Reduced physical ability to get adequate water and food.  What increases the risk? This condition is more likely to develop in people:  Who have a poorly controlled long-term (chronic) illness, such as diabetes, heart disease, or kidney disease.  Who are age 75 or older.  Who are disabled.  Who live in a place with high altitude.  Who play endurance sports.  What are the signs or symptoms? Symptoms of mild dehydration may include:  Thirst.  Dry lips.  Slightly dry mouth.  Dry, warm skin.  Dizziness. Symptoms of moderate dehydration may include:  Very dry mouth.  Muscle cramps.  Dark urine. Urine may be the color of tea.  Decreased urine production.  Decreased tear production.  Heartbeat that is irregular or faster than normal (palpitations).  Headache.  Light-headedness, especially when you stand up from a sitting position.  Fainting (syncope). Symptoms of severe dehydration may  include:  Changes in skin, such as: ? Cold and clammy skin. ? Blotchy (mottled) or pale skin. ? Skin that does not quickly return to normal after being lightly pinched and released (poor skin turgor).  Changes in body fluids, such as: ? Extreme thirst. ? No tear production. ? Inability to sweat when body temperature is high, such as in hot weather. ? Very little urine production.  Changes in vital signs, such as: ? Weak pulse. ? Pulse that is more than 100 beats a minute when sitting still. ? Rapid breathing. ? Low blood pressure.  Other changes, such as: ? Sunken eyes. ? Cold hands and feet. ? Confusion. ? Lack of energy (lethargy). ? Difficulty waking up from sleep. ? Short-term weight loss. ? Unconsciousness. How is this diagnosed? This condition is diagnosed based on your symptoms and a physical exam. Blood and urine tests may be done to help confirm the diagnosis. How is this treated? Treatment for this condition depends on the severity. Mild or moderate dehydration can often be treated at home. Treatment should be started right away. Do not wait until dehydration becomes severe. Severe dehydration is an emergency and it needs to be treated in a hospital. Treatment for mild dehydration may include:  Drinking more fluids.  Replacing salts and minerals in your blood (electrolytes) that you may have lost. Treatment for moderate dehydration may include:  Drinking an oral rehydration solution (ORS). This is a drink that helps you replace fluids and electrolytes (rehydrate). It can be found at pharmacies and retail stores. Treatment for severe dehydration may  include:  Receiving fluids through an IV tube.  Receiving an electrolyte solution through a feeding tube that is passed through your nose and into your stomach (nasogastric tube, or NG tube).  Correcting any abnormalities in electrolytes.  Treating the underlying cause of dehydration. Follow these instructions at  home:  If directed by your health care provider, drink an ORS: ? Make an ORS by following instructions on the package. ? Start by drinking small amounts, about  cup (120 mL) every 5-10 minutes. ? Slowly increase how much you drink until you have taken the amount recommended by your health care provider.  Drink enough clear fluid to keep your urine clear or pale yellow. If you were told to drink an ORS, finish the ORS first, then start slowly drinking other clear fluids. Drink fluids such as: ? Water. Do not drink only water. Doing that can lead to having too little salt (sodium) in the body (hyponatremia). ? Ice chips. ? Fruit juice that you have added water to (diluted fruit juice). ? Low-calorie sports drinks.  Avoid: ? Alcohol. ? Drinks that contain a lot of sugar. These include high-calorie sports drinks, fruit juice that is not diluted, and soda. ? Caffeine. ? Foods that are greasy or contain a lot of fat or sugar.  Take over-the-counter and prescription medicines only as told by your health care provider.  Do not take sodium tablets. This can lead to having too much sodium in the body (hypernatremia).  Eat foods that contain a healthy balance of electrolytes, such as bananas, oranges, potatoes, tomatoes, and spinach.  Keep all follow-up visits as told by your health care provider. This is important. Contact a health care provider if:  You have abdominal pain that: ? Gets worse. ? Stays in one area (localizes).  You have a rash.  You have a stiff neck.  You are more irritable than usual.  You are sleepier or more difficult to wake up than usual.  You feel weak or dizzy.  You feel very thirsty.  You have urinated only a small amount of very dark urine over 6-8 hours. Get help right away if:  You have symptoms of severe dehydration.  You cannot drink fluids without vomiting.  Your symptoms get worse with treatment.  You have a fever.  You have a severe  headache.  You have vomiting or diarrhea that: ? Gets worse. ? Does not go away.  You have blood or green matter (bile) in your vomit.  You have blood in your stool. This may cause stool to look black and tarry.  You have not urinated in 6-8 hours.  You faint.  Your heart rate while sitting still is over 100 beats a minute.  You have trouble breathing. This information is not intended to replace advice given to you by your health care provider. Make sure you discuss any questions you have with your health care provider. Document Released: 07/23/2005 Document Revised: 02/17/2016 Document Reviewed: 09/16/2015 Elsevier Interactive Patient Education  [employer].

## 2018-01-15 NOTE — Progress Notes (Signed)
Assessment and Plan:  Edit was seen today for nausea and fatigue.  Diagnoses and all orders for this visit:  Dizziness/nausea Suspect over diuresed; hold metalozone, will call in AM with instructions for diuretic dose adjustment pending lab results, orthostatics normal today, encouraged modest fluid increase for a few days Continue with zofran, may take up to 4 tabs daily Will check liver labs, follow up with GI if significant changes -     CBC with Differential/Platelet -     COMPLETE METABOLIC PANEL WITH GFR -     Ammonia -     Protime-INR  Chronic diastolic CHF (congestive heart failure) (HCC) -     COMPLETE METABOLIC PANEL WITH GFR  Liver cirrhosis secondary to nonalcoholic steatohepatitis (NASH) ([place]) -     COMPLETE METABOLIC PANEL WITH GFR -     Ammonia -     Protime-INR  Further disposition pending results of labs. Discussed med's effects and SE's.   Over 30 minutes of exam, counseling, chart review, and critical decision making was performed.   Future Appointments  Date Time Provider [place]  01/24/2018  1:15 PM Evelina Bucy, DPM [facility]  02/03/2018  2:30 PM Unk Pinto, MD [facility] None  02/07/2018  4:15 PM Tanda Rockers, MD [facility] None  06/09/2018 10:00 AM Vicie Mutters, PA-C [facility] None  11/14/2018  3:00 PM [facility] None  11/14/2018  3:30 PM Truitt Merle, MD [facility] None    ------------------------------------------------------------------------------------------------------------------   HPI BP (!) 124/54   Pulse 78   Temp (!) 97.5 F (36.4 C)   Ht 5' 2"  (1.575 m)   Wt 173 lb (78.5 kg) Comment: per patient  SpO2 97%   BMI 31.64 kg/m   72 y.o.female with diastolic CHF, chronic respiratory failure, liver cirrhosis/hepatocellular carcinoma presents accompanied by her husband who is very concerned of excessive weight loss, dizziness and nausea that began 3-4 days ago.   She is prescribed  diuretic for diastolic CHF, and currently takes torsemide 40 mg twice a day as well as spironolactone 25 mg BID; metalozone 5 mg has also been prescribed with instructions to take 1/2 tab for weight above 180lb. Her husband shares her home weight list and shares her noted her weight increased to nearly 185 lb over the weekend, and gave the patient 5 mg metalozone (which she has tolerated previously) on Saturday over the weekend in addition to her torsemide and spironolactone after which the patient was up all night with frequency, in the following days has lost ~12 lb and now down to 173 lb She also reports increased nausea over this time, poor appetite, has been taking zofran which does improve nausea some. She also endorses some new dizziness. Denies fever/chills, increasing abdominal girth, hematochezia, melena, emesis.   She reports loose stools (not watery) over last few days, has not been taking lactulose for cirrhosis. Continues with rifampin.   She has chronic respiratory failure/ COPD GOLD IV, recurrent R pleural effusion and is dependent on oxygen; presents wearing 2 L O2, reports dyspnea at baseline for her. O2 saturations cannot be obtained secondary to cool extremities. Patient appears in no acute distress, respiratory rate and effort normal.   She has a history of Diastolic, denies chest pain, palpitations, cough, edema,  Wt Readings from Last 3 Encounters:  01/15/18 173 lb (78.5 kg)  12/25/17 177 lb (80.3 kg)  12/16/17 180 lb (81.6 kg)    Past Medical History:  Diagnosis Date  . Asthma   .  Cancer [facility])    liver cancer   . COPD (chronic obstructive pulmonary disease) ([place])    x 3 years   . Depression   . Diabetes mellitus without complication ([place])    diet controlled  not on meds   . Edema    left leg   . Esophageal varices ([place])   . Fibromyalgia   . Gastritis   . GERD (gastroesophageal reflux disease)   . Hepatic cirrhosis ([place])   . Hepatic encephalopathy ([place])   .  History of blood transfusion   . Hyperlipidemia   . Hypertension   . IBS (irritable bowel syndrome)   . Phlebitis    left leg x 2   . Pneumonia    hx of   . Splenomegaly   . Vitamin D deficiency      Allergies  Allergen Reactions  . Atorvastatin Other (See Comments)    Unknown reaction  . Diphenhydramine Hcl (Sleep) Hives  . Hydrocodone-Acetaminophen Other (See Comments)    Unknown reaction  . Lopid [Gemfibrozil] Other (See Comments)    Unknown reaction  . Loratadine Hives  . Lorazepam Hives  . Simvastatin Other (See Comments)    Unknown reaction  . Sulfamethoxazole Hives  . Sulfonamide Derivatives Hives    Current Outpatient Medications on File Prior to Visit  Medication Sig  . albuterol (PROVENTIL HFA;VENTOLIN HFA) 108 (90 Base) MCG/ACT inhaler Inhale 2 puffs into the lungs every 6 (six) hours as needed for wheezing or shortness of breath.  . allopurinol (ZYLOPRIM) 300 MG tablet take 1 tablet (300 mg) by mouth daily with supper - for gout  . ALPRAZolam (XANAX) 0.25 MG tablet Take 1 tab as needed for anxiety, cramping, air hunger every 8 hours as needed.  Marland Kitchen aspirin EC 81 MG tablet Take 81 mg by mouth at bedtime.  . bisoprolol (ZEBETA) 10 MG tablet Take 1 tablet (10 mg total) by mouth daily.  . Cholecalciferol (VITAMIN D3) 5000 units CAPS Take 5,000 Units by mouth daily with breakfast.  . citalopram (CELEXA) 20 MG tablet Take 1 tablet (20 mg total) by mouth daily with supper.  . cyclobenzaprine (FLEXERIL) 10 MG tablet Take 1 tablet (10 mg total) by mouth 3 (three) times daily as needed for muscle spasms.  . ferrous sulfate 325 (65 FE) MG tablet Take 325 mg by mouth daily.   Marland Kitchen gabapentin (NEURONTIN) 600 MG tablet Take 600 mg by mouth 3 (three) times daily.  . Glycopyrrolate-Formoterol (BEVESPI AEROSPHERE) 9-4.8 MCG/ACT AERO Inhale 2 puffs into the lungs 2 (two) times daily.  Marland Kitchen lactulose (CHRONULAC) 10 GM/15ML solution take 76ms TWICE DAILY (Patient taking differently: Take  10 g by mouth daily. )  . Magnesium 250 MG TABS Take 250 mg by mouth at bedtime.   . ondansetron (ZOFRAN) 4 MG tablet Take 1 tablet (4 mg total) by mouth every 6 (six) hours as needed for nausea or vomiting.  . OXYGEN Inhale 2-3 L into the lungs continuous. 3 L with exertion  . potassium chloride (K-DUR) 10 MEQ tablet Take 2 tablet 3 times a day.  . rifampin (RIFADIN) 300 MG capsule Take 1 capsule by mouth 2 (two) times daily.  .Marland Kitchentorsemide (DEMADEX) 20 MG tablet Take 3 tablets 2 x/ day  Morning & Nite for fluid Retention & Swelling (Patient taking differently: 1 tablet twice daily)  . traMADol (ULTRAM) 50 MG tablet Take 50 mg by mouth every 12 hours as needed for pain for chronic pain   No current facility-administered  medications on file prior to visit.     ROS: all negative except above.   Physical Exam:  BP (!) 124/54   Pulse 78   Temp (!) 97.5 F (36.4 C)   Ht 5' 2"  (1.575 m)   Wt 173 lb (78.5 kg) Comment: per patient  SpO2 97%   BMI 31.64 kg/m   General Appearance: Well nourished, in no acute distress. Eyes: PERRLA, EOMs, conjunctiva no swelling or erythema Sinuses: No Frontal/maxillary tenderness ENT/Mouth: Ext aud canals clear, TMs without erythema, bulging. No erythema, swelling, or exudate on post pharynx.  Tonsils not swollen or erythematous. Hearing normal.  Neck: Supple, thyroid normal.  Respiratory: Respiratory effort normal, BS present throughout without rales, rhonchi, wheezing or stridor.  Cardio: RRR with 2/6 systolic murmur. Thready distal pulses without edema to bilateral lower extremities. Skin of ankles/lower legs thickened and with brown/red discoloration.   Abdomen: Soft, + BS.  Non tender, no guarding, rebound, hernias, masses. No pronounced ascites, asterixis Lymphatics: Non tender without lymphadenopathy.  Musculoskeletal: Wheelchair bound Skin: Warm, dry without rashes, lesions, ecchymosis.  Neuro: Cranial nerves intact. Normal muscle tone. Psych:  Awake and oriented X 3, flat affect affect, Insight and Judgment fair.     Izora Ribas, NP 4:56 PM [facility]

## 2018-01-16 ENCOUNTER — Other Ambulatory Visit: Payer: Self-pay | Admitting: Adult Health

## 2018-01-16 ENCOUNTER — Other Ambulatory Visit: Payer: PPO

## 2018-01-16 DIAGNOSIS — Z79899 Other long term (current) drug therapy: Secondary | ICD-10-CM

## 2018-01-16 DIAGNOSIS — D649 Anemia, unspecified: Secondary | ICD-10-CM

## 2018-01-16 LAB — CBC WITH DIFFERENTIAL/PLATELET
BASOS PCT: 0.7 %
Basophils Absolute: 41 cells/uL (ref 0–200)
EOS ABS: 330 {cells}/uL (ref 15–500)
Eosinophils Relative: 5.6 %
HEMATOCRIT: 23.5 % — AB (ref 35.0–45.0)
HEMOGLOBIN: 7.8 g/dL — AB (ref 11.7–15.5)
LYMPHS ABS: 1086 {cells}/uL (ref 850–3900)
MCH: 32 pg (ref 27.0–33.0)
MCHC: 33.2 g/dL (ref 32.0–36.0)
MCV: 96.3 fL (ref 80.0–100.0)
MPV: 12.2 fL (ref 7.5–12.5)
Monocytes Relative: 5.8 %
NEUTROS ABS: 4101 {cells}/uL (ref 1500–7800)
Neutrophils Relative %: 69.5 %
Platelets: 122 10*3/uL — ABNORMAL LOW (ref 140–400)
RBC: 2.44 10*6/uL — AB (ref 3.80–5.10)
RDW: 14.2 % (ref 11.0–15.0)
TOTAL LYMPHOCYTE: 18.4 %
WBC: 5.9 10*3/uL (ref 3.8–10.8)
WBCMIX: 342 {cells}/uL (ref 200–950)

## 2018-01-16 LAB — COMPLETE METABOLIC PANEL WITH GFR
AG Ratio: 1.7 (calc) (ref 1.0–2.5)
ALKALINE PHOSPHATASE (APISO): 95 U/L (ref 33–130)
ALT: 15 U/L (ref 6–29)
AST: 21 U/L (ref 10–35)
Albumin: 2.9 g/dL — ABNORMAL LOW (ref 3.6–5.1)
BUN/Creatinine Ratio: 41 (calc) — ABNORMAL HIGH (ref 6–22)
BUN: 53 mg/dL — AB (ref 7–25)
CALCIUM: 9 mg/dL (ref 8.6–10.4)
CO2: 36 mmol/L — ABNORMAL HIGH (ref 20–32)
CREATININE: 1.29 mg/dL — AB (ref 0.60–0.93)
Chloride: 98 mmol/L (ref 98–110)
GFR, Est African American: 48 mL/min/{1.73_m2} — ABNORMAL LOW (ref >=60)
GFR, Est Non African American: 42 mL/min/{1.73_m2} — ABNORMAL LOW (ref >=60)
Globulin: 1.7 g/dL (calc) — ABNORMAL LOW (ref 1.9–3.7)
Glucose, Bld: 104 mg/dL — ABNORMAL HIGH (ref 65–99)
Potassium: 3 mmol/L — ABNORMAL LOW (ref 3.5–5.3)
SODIUM: 143 mmol/L (ref 135–146)
TOTAL PROTEIN: 4.6 g/dL — AB (ref 6.1–8.1)
Total Bilirubin: 1.2 mg/dL (ref 0.2–1.2)

## 2018-01-16 LAB — PROTIME-INR
INR: 1.1
Prothrombin Time: 11.3 s (ref 9.0–11.5)

## 2018-01-16 LAB — AMMONIA: AMMONIA: 54 umol/L (ref <=72)

## 2018-01-18 LAB — CBC WITH DIFFERENTIAL/PLATELET
BASOS PCT: 0.6 %
Basophils Absolute: 31 cells/uL (ref 0–200)
EOS PCT: 7.1 %
Eosinophils Absolute: 369 cells/uL (ref 15–500)
HEMATOCRIT: 24 % — AB (ref 35.0–45.0)
Hemoglobin: 7.3 g/dL — ABNORMAL LOW (ref 11.7–15.5)
Lymphs Abs: 853 cells/uL (ref 850–3900)
MCH: 31.7 pg (ref 27.0–33.0)
MCHC: 30.4 g/dL — AB (ref 32.0–36.0)
MCV: 104.3 fL — ABNORMAL HIGH (ref 80.0–100.0)
MPV: 11.5 fL (ref 7.5–12.5)
Monocytes Relative: 4 %
NEUTROS PCT: 71.9 %
Neutro Abs: 3739 cells/uL (ref 1500–7800)
PLATELETS: 110 10*3/uL — AB (ref 140–400)
RBC: 2.3 10*6/uL — AB (ref 3.80–5.10)
RDW: 14.1 % (ref 11.0–15.0)
TOTAL LYMPHOCYTE: 16.4 %
WBC: 5.2 10*3/uL (ref 3.8–10.8)
WBCMIX: 208 {cells}/uL (ref 200–950)

## 2018-01-18 LAB — URINE CULTURE
MICRO NUMBER:: 90715585
SPECIMEN QUALITY: ADEQUATE

## 2018-01-18 LAB — URINALYSIS W MICROSCOPIC + REFLEX CULTURE
BILIRUBIN URINE: NEGATIVE
Bacteria, UA: NONE SEEN /HPF
Glucose, UA: NEGATIVE
Hgb urine dipstick: NEGATIVE
Hyaline Cast: NONE SEEN /LPF
Ketones, ur: NEGATIVE
NITRITES URINE, INITIAL: NEGATIVE
Protein, ur: NEGATIVE
SPECIFIC GRAVITY, URINE: 1.011 (ref 1.001–1.03)
WBC, UA: >=60 /HPF — AB (ref 0–5)
pH: 7 (ref 5.0–8.0)

## 2018-01-18 LAB — CULTURE INDICATED

## 2018-01-18 LAB — FOLATE RBC: RBC FOLATE: 1207 ng/mL (ref >280)

## 2018-01-18 LAB — TEST AUTHORIZATION

## 2018-01-18 LAB — FERRITIN: FERRITIN: 49 ng/mL (ref 20–288)

## 2018-01-18 LAB — VITAMIN B12: VITAMIN B 12: 974 pg/mL (ref 200–1100)

## 2018-01-18 LAB — RETICULOCYTES
ABS Retic: 110000 cells/uL — ABNORMAL HIGH (ref 20000–8000)
RETIC CT PCT: 5 %

## 2018-01-18 LAB — IRON, TOTAL/TOTAL IRON BINDING CAP
%SAT: 9 % — AB (ref 11–50)
Iron: 27 ug/dL — ABNORMAL LOW (ref 45–160)
TIBC: 292 mcg/dL (calc) (ref 250–450)

## 2018-01-19 ENCOUNTER — Encounter (HOSPITAL_COMMUNITY): Payer: Self-pay | Admitting: Emergency Medicine

## 2018-01-19 ENCOUNTER — Inpatient Hospital Stay (HOSPITAL_COMMUNITY)
Admission: EM | Admit: 2018-01-19 | Discharge: 2018-01-21 | DRG: 441 | Disposition: A | Discharge status: Home Health | Payer: PPO | Attending: Family Medicine | Admitting: Family Medicine

## 2018-01-19 ENCOUNTER — Telehealth: Payer: Self-pay | Admitting: Adult Health

## 2018-01-19 DIAGNOSIS — D539 Nutritional anemia, unspecified: Secondary | ICD-10-CM | POA: Diagnosis present

## 2018-01-19 DIAGNOSIS — R51 Headache: Secondary | ICD-10-CM | POA: Diagnosis not present

## 2018-01-19 DIAGNOSIS — I8500 Esophageal varices without bleeding: Secondary | ICD-10-CM | POA: Diagnosis present

## 2018-01-19 DIAGNOSIS — D696 Thrombocytopenia, unspecified: Secondary | ICD-10-CM | POA: Diagnosis present

## 2018-01-19 DIAGNOSIS — Z7982 Long term (current) use of aspirin: Secondary | ICD-10-CM

## 2018-01-19 DIAGNOSIS — R531 Weakness: Secondary | ICD-10-CM | POA: Diagnosis not present

## 2018-01-19 DIAGNOSIS — D62 Acute posthemorrhagic anemia: Secondary | ICD-10-CM | POA: Diagnosis not present

## 2018-01-19 DIAGNOSIS — K766 Portal hypertension: Principal | ICD-10-CM | POA: Diagnosis present

## 2018-01-19 DIAGNOSIS — R5383 Other fatigue: Secondary | ICD-10-CM | POA: Diagnosis not present

## 2018-01-19 DIAGNOSIS — N183 Chronic kidney disease, stage 3 unspecified: Secondary | ICD-10-CM | POA: Diagnosis present

## 2018-01-19 DIAGNOSIS — K922 Gastrointestinal hemorrhage, unspecified: Secondary | ICD-10-CM

## 2018-01-19 DIAGNOSIS — I8510 Secondary esophageal varices without bleeding: Secondary | ICD-10-CM | POA: Diagnosis present

## 2018-01-19 DIAGNOSIS — Z66 Do not resuscitate: Secondary | ICD-10-CM | POA: Diagnosis present

## 2018-01-19 DIAGNOSIS — R195 Other fecal abnormalities: Secondary | ICD-10-CM

## 2018-01-19 DIAGNOSIS — Z9981 Dependence on supplemental oxygen: Secondary | ICD-10-CM | POA: Diagnosis not present

## 2018-01-19 DIAGNOSIS — J9622 Acute and chronic respiratory failure with hypercapnia: Secondary | ICD-10-CM | POA: Diagnosis not present

## 2018-01-19 DIAGNOSIS — J9621 Acute and chronic respiratory failure with hypoxia: Secondary | ICD-10-CM | POA: Diagnosis present

## 2018-01-19 DIAGNOSIS — K7460 Unspecified cirrhosis of liver: Secondary | ICD-10-CM | POA: Diagnosis present

## 2018-01-19 DIAGNOSIS — E870 Hyperosmolality and hypernatremia: Secondary | ICD-10-CM | POA: Diagnosis present

## 2018-01-19 DIAGNOSIS — C220 Liver cell carcinoma: Secondary | ICD-10-CM | POA: Diagnosis present

## 2018-01-19 DIAGNOSIS — Z87891 Personal history of nicotine dependence: Secondary | ICD-10-CM

## 2018-01-19 DIAGNOSIS — J9611 Chronic respiratory failure with hypoxia: Secondary | ICD-10-CM | POA: Diagnosis not present

## 2018-01-19 DIAGNOSIS — J449 Chronic obstructive pulmonary disease, unspecified: Secondary | ICD-10-CM | POA: Diagnosis present

## 2018-01-19 DIAGNOSIS — K7581 Nonalcoholic steatohepatitis (NASH): Secondary | ICD-10-CM | POA: Diagnosis present

## 2018-01-19 DIAGNOSIS — N179 Acute kidney failure, unspecified: Secondary | ICD-10-CM | POA: Diagnosis present

## 2018-01-19 DIAGNOSIS — Z96643 Presence of artificial hip joint, bilateral: Secondary | ICD-10-CM | POA: Diagnosis present

## 2018-01-19 DIAGNOSIS — I959 Hypotension, unspecified: Secondary | ICD-10-CM | POA: Diagnosis present

## 2018-01-19 DIAGNOSIS — Z79899 Other long term (current) drug therapy: Secondary | ICD-10-CM

## 2018-01-19 DIAGNOSIS — D500 Iron deficiency anemia secondary to blood loss (chronic): Secondary | ICD-10-CM

## 2018-01-19 DIAGNOSIS — I130 Hypertensive heart and chronic kidney disease with heart failure and stage 1 through stage 4 chronic kidney disease, or unspecified chronic kidney disease: Secondary | ICD-10-CM | POA: Diagnosis not present

## 2018-01-19 DIAGNOSIS — K219 Gastro-esophageal reflux disease without esophagitis: Secondary | ICD-10-CM | POA: Diagnosis present

## 2018-01-19 DIAGNOSIS — R140 Abdominal distension (gaseous): Secondary | ICD-10-CM

## 2018-01-19 DIAGNOSIS — R112 Nausea with vomiting, unspecified: Secondary | ICD-10-CM | POA: Diagnosis not present

## 2018-01-19 DIAGNOSIS — J9612 Chronic respiratory failure with hypercapnia: Secondary | ICD-10-CM

## 2018-01-19 DIAGNOSIS — Z7951 Long term (current) use of inhaled steroids: Secondary | ICD-10-CM

## 2018-01-19 DIAGNOSIS — I10 Essential (primary) hypertension: Secondary | ICD-10-CM | POA: Diagnosis present

## 2018-01-19 DIAGNOSIS — E669 Obesity, unspecified: Secondary | ICD-10-CM

## 2018-01-19 DIAGNOSIS — F329 Major depressive disorder, single episode, unspecified: Secondary | ICD-10-CM | POA: Diagnosis present

## 2018-01-19 DIAGNOSIS — E662 Morbid (severe) obesity with alveolar hypoventilation: Secondary | ICD-10-CM | POA: Diagnosis present

## 2018-01-19 DIAGNOSIS — E1122 Type 2 diabetes mellitus with diabetic chronic kidney disease: Secondary | ICD-10-CM | POA: Diagnosis not present

## 2018-01-19 DIAGNOSIS — E66811 Obesity, class 1: Secondary | ICD-10-CM | POA: Diagnosis present

## 2018-01-19 DIAGNOSIS — K3189 Other diseases of stomach and duodenum: Secondary | ICD-10-CM | POA: Diagnosis not present

## 2018-01-19 DIAGNOSIS — I4510 Unspecified right bundle-branch block: Secondary | ICD-10-CM | POA: Diagnosis present

## 2018-01-19 DIAGNOSIS — D649 Anemia, unspecified: Secondary | ICD-10-CM

## 2018-01-19 DIAGNOSIS — Z6833 Body mass index (BMI) 33.0-33.9, adult: Secondary | ICD-10-CM | POA: Diagnosis not present

## 2018-01-19 DIAGNOSIS — R188 Other ascites: Secondary | ICD-10-CM | POA: Diagnosis not present

## 2018-01-19 DIAGNOSIS — D509 Iron deficiency anemia, unspecified: Secondary | ICD-10-CM | POA: Diagnosis not present

## 2018-01-19 DIAGNOSIS — I5032 Chronic diastolic (congestive) heart failure: Secondary | ICD-10-CM | POA: Diagnosis not present

## 2018-01-19 DIAGNOSIS — M797 Fibromyalgia: Secondary | ICD-10-CM | POA: Diagnosis present

## 2018-01-19 DIAGNOSIS — E785 Hyperlipidemia, unspecified: Secondary | ICD-10-CM | POA: Diagnosis present

## 2018-01-19 DIAGNOSIS — G2581 Restless legs syndrome: Secondary | ICD-10-CM | POA: Diagnosis present

## 2018-01-19 DIAGNOSIS — F419 Anxiety disorder, unspecified: Secondary | ICD-10-CM | POA: Diagnosis present

## 2018-01-19 LAB — COMPREHENSIVE METABOLIC PANEL
ALT: 20 U/L (ref 14–54)
AST: 29 U/L (ref 15–41)
Albumin: 2.5 g/dL — ABNORMAL LOW (ref 3.5–5.0)
Alkaline Phosphatase: 106 U/L (ref 38–126)
Anion gap: 8 (ref 5–15)
BILIRUBIN TOTAL: 1.2 mg/dL (ref 0.3–1.2)
BUN: 36 mg/dL — AB (ref 6–20)
CALCIUM: 8.4 mg/dL — AB (ref 8.9–10.3)
CHLORIDE: 99 mmol/L — AB (ref 101–111)
CO2: 28 mmol/L (ref 22–32)
Creatinine, Ser: 1.5 mg/dL — ABNORMAL HIGH (ref 0.44–1.00)
GFR, EST AFRICAN AMERICAN: 39 mL/min — AB (ref >60)
GFR, EST NON AFRICAN AMERICAN: 34 mL/min — AB (ref >60)
Glucose, Bld: 117 mg/dL — ABNORMAL HIGH (ref 65–99)
Potassium: 4.3 mmol/L (ref 3.5–5.1)
Sodium: 135 mmol/L (ref 135–145)
TOTAL PROTEIN: 4.9 g/dL — AB (ref 6.5–8.1)

## 2018-01-19 LAB — I-STAT TROPONIN, ED: TROPONIN I, POC: 0 ng/mL (ref 0.00–0.08)

## 2018-01-19 LAB — CBC
HCT: 23.9 % — ABNORMAL LOW (ref 36.0–46.0)
Hemoglobin: 7.1 g/dL — ABNORMAL LOW (ref 12.0–15.0)
MCH: 32 pg (ref 26.0–34.0)
MCHC: 29.7 g/dL — ABNORMAL LOW (ref 30.0–36.0)
MCV: 107.7 fL — AB (ref 78.0–100.0)
PLATELETS: 110 10*3/uL — AB (ref 150–400)
RBC: 2.22 MIL/uL — AB (ref 3.87–5.11)
RDW: 16.9 % — AB (ref 11.5–15.5)
WBC: 6.2 10*3/uL (ref 4.0–10.5)

## 2018-01-19 LAB — PREPARE RBC (CROSSMATCH)

## 2018-01-19 LAB — POC OCCULT BLOOD, ED: FECAL OCCULT BLD: POSITIVE — AB

## 2018-01-19 MED ORDER — CITALOPRAM HYDROBROMIDE 20 MG PO TABS
20.0000 mg | ORAL_TABLET | Freq: Every day | ORAL | Status: DC
  Administered 2018-01-20: 20 mg via ORAL
  Filled 2018-01-19: qty 1

## 2018-01-19 MED ORDER — TRAMADOL HCL 50 MG PO TABS
50.0000 mg | ORAL_TABLET | Freq: Two times a day (BID) | ORAL | Status: DC | PRN
Start: 2018-01-19 — End: 2018-01-21
  Administered 2018-01-20 – 2018-01-21 (×2): 50 mg via ORAL
  Filled 2018-01-19 (×2): qty 1

## 2018-01-19 MED ORDER — MAGNESIUM OXIDE 400 (241.3 MG) MG PO TABS
200.0000 mg | ORAL_TABLET | Freq: Every day | ORAL | Status: DC
  Administered 2018-01-19 – 2018-01-20 (×2): 200 mg via ORAL
  Filled 2018-01-19 (×2): qty 1

## 2018-01-19 MED ORDER — IPRATROPIUM-ALBUTEROL 0.5-2.5 (3) MG/3ML IN SOLN
3.0000 mL | Freq: Four times a day (QID) | RESPIRATORY_TRACT | Status: DC | PRN
  Administered 2018-01-19: 3 mL via RESPIRATORY_TRACT
  Filled 2018-01-19: qty 3

## 2018-01-19 MED ORDER — UMECLIDINIUM BROMIDE 62.5 MCG/INH IN AEPB
1.0000 | INHALATION_SPRAY | Freq: Every day | RESPIRATORY_TRACT | Status: DC
  Administered 2018-01-20 – 2018-01-21 (×2): 1 via RESPIRATORY_TRACT
  Filled 2018-01-19: qty 7

## 2018-01-19 MED ORDER — SODIUM CHLORIDE 0.9 % IV SOLN
10.0000 mL/h | Freq: Once | INTRAVENOUS | Status: AC
  Administered 2018-01-19: 10 mL/h via INTRAVENOUS

## 2018-01-19 MED ORDER — SODIUM CHLORIDE 0.9% FLUSH
3.0000 mL | Freq: Two times a day (BID) | INTRAVENOUS | Status: DC
  Administered 2018-01-19 – 2018-01-20 (×3): 3 mL via INTRAVENOUS

## 2018-01-19 MED ORDER — LACTULOSE 10 GM/15ML PO SOLN
20.0000 g | Freq: Every day | ORAL | Status: DC
  Administered 2018-01-21: 20 g via ORAL
  Filled 2018-01-19 (×2): qty 30

## 2018-01-19 MED ORDER — CYCLOBENZAPRINE HCL 10 MG PO TABS: 10.0000 mg | ORAL_TABLET | Freq: Three times a day (TID) | ORAL | Status: DC | PRN

## 2018-01-19 MED ORDER — RIFAMPIN 300 MG PO CAPS
300.0000 mg | ORAL_CAPSULE | Freq: Two times a day (BID) | ORAL | Status: DC
  Administered 2018-01-19 – 2018-01-21 (×4): 300 mg via ORAL
  Filled 2018-01-19 (×4): qty 1

## 2018-01-19 MED ORDER — PANTOPRAZOLE SODIUM 40 MG IV SOLR
40.0000 mg | Freq: Two times a day (BID) | INTRAVENOUS | Status: DC
  Administered 2018-01-19 – 2018-01-21 (×4): 40 mg via INTRAVENOUS
  Filled 2018-01-19 (×4): qty 40

## 2018-01-19 MED ORDER — ONDANSETRON HCL 4 MG PO TABS: 4.0000 mg | ORAL_TABLET | Freq: Four times a day (QID) | ORAL | Status: DC | PRN

## 2018-01-19 MED ORDER — GABAPENTIN 600 MG PO TABS
600.0000 mg | ORAL_TABLET | Freq: Two times a day (BID) | ORAL | Status: DC
  Administered 2018-01-19 – 2018-01-21 (×4): 600 mg via ORAL
  Filled 2018-01-19 (×4): qty 1

## 2018-01-19 MED ORDER — ALLOPURINOL 300 MG PO TABS
300.0000 mg | ORAL_TABLET | Freq: Every day | ORAL | Status: DC
  Administered 2018-01-19 – 2018-01-21 (×3): 300 mg via ORAL
  Filled 2018-01-19 (×3): qty 1

## 2018-01-19 MED ORDER — ARFORMOTEROL TARTRATE 15 MCG/2ML IN NEBU
15.0000 ug | INHALATION_SOLUTION | Freq: Two times a day (BID) | RESPIRATORY_TRACT | Status: DC
  Administered 2018-01-20 – 2018-01-21 (×3): 15 ug via RESPIRATORY_TRACT
  Filled 2018-01-19 (×4): qty 2

## 2018-01-19 MED ORDER — ONDANSETRON HCL 4 MG/2ML IJ SOLN
4.0000 mg | Freq: Four times a day (QID) | INTRAMUSCULAR | Status: DC | PRN
Start: 2018-01-19 — End: 2018-01-21
  Administered 2018-01-21 (×2): 4 mg via INTRAVENOUS
  Filled 2018-01-19 (×2): qty 2

## 2018-01-19 MED ORDER — ALPRAZOLAM 0.25 MG PO TABS
0.2500 mg | ORAL_TABLET | Freq: Three times a day (TID) | ORAL | Status: DC | PRN
  Administered 2018-01-19: 0.25 mg via ORAL
  Filled 2018-01-19: qty 1

## 2018-01-19 NOTE — H&P (View-Only) (Signed)
Consult Note for [facility]  Reason for Consult: Anemia, cirrhosis, history of esophageal varices, heme positive stool Referring Physician: [facility].  [name] HPI: This is a 72 year old female with a PMH of NASH cirrhosis with [place] s/p ablative therapy, COPD, DM, HTN, stage 3 CKD who reported issues with feeling week on 01/15/2018.  Blood work was obtained and she was noted to have an HGB of 7.1 g/dL today.  Her blood work on 01/15/2018 and 01/16/2018 was at 7.8 and 7.3, respectively.  Her baseline HGB appears to be between 8-9.  Her last EGD was with Dr. Deatra Ina on 10/22/2013 with findings of esophageal varices and portal HTN gastropathy.  Her esophageal varices were always graded as a 1 or 2.  She last saw Dr. Havery Moros on 12/10/2017 for a routine follow up.  There is a history of ascites and she currently takes torsemide 60 mg BID.  Her husband reports that she has dark stools from the iron supplementation that she takes, but he thinks it may be darker of late.  Past Medical History:  Diagnosis Date  . Asthma   . Cancer [facility])    liver cancer   . COPD (chronic obstructive pulmonary disease) ([place])    x 3 years   . Depression   . Diabetes mellitus without complication ([place])    diet controlled  not on meds   . Edema    left leg   . Esophageal varices ([place])   . Fibromyalgia   . Gastritis   . GERD (gastroesophageal reflux disease)   . Hepatic cirrhosis ([place])   . Hepatic encephalopathy ([place])   . History of blood transfusion   . Hyperlipidemia   . Hypertension   . IBS (irritable bowel syndrome)   . Phlebitis    left leg x 2   . Pneumonia    hx of   . Splenomegaly   . Vitamin D deficiency     Past Surgical History:  Procedure Laterality Date  . ABDOMINAL HYSTERECTOMY    . BACK SURGERY    . CATARACT EXTRACTION, BILATERAL    . CHOLECYSTECTOMY    . ESOPHAGOGASTRODUODENOSCOPY  07/16/2012   Procedure: ESOPHAGOGASTRODUODENOSCOPY (EGD);  Surgeon: Inda Castle, MD;   Location: [facility];  Service: Endoscopy;  Laterality: N/A;  . GASTRIC VARICES BANDING  07/16/2012   Procedure: GASTRIC VARICES BANDING;  Surgeon: Inda Castle, MD;  Location: [facility];  Service: Endoscopy;  Laterality: N/A;  . HIP ARTHROPLASTY Bilateral   . IR GENERIC HISTORICAL  06/25/2016   IR RADIOLOGIST EVAL & MGMT 06/25/2016 [facility]  . IR GENERIC HISTORICAL  06/27/2016   IR KYPHO LUMBAR INC FX REDUCE BONE BX UNI/BIL CANNULATION INC/IMAGING 06/27/2016 Luanne Bras, MD [facility]  . IR GENERIC HISTORICAL  07/17/2016   IR RADIOLOGIST EVAL & MGMT 07/17/2016 [facility]  . IR RADIOLOGIST EVAL & MGMT  07/10/2017  . IR RADIOLOGIST EVAL & MGMT  10/01/2017  . IR RADIOLOGIST EVAL & MGMT  12/11/2017  . IR THORACENTESIS ASP PLEURAL SPACE W/IMG GUIDE  09/12/2017  . IR THORACENTESIS ASP PLEURAL SPACE W/IMG GUIDE  09/17/2017  . NECK SURGERY    . RADIOFREQUENCY ABLATION N/A 08/30/2017   Procedure: CT MICROWAVE THERMAL ABLATION;  Surgeon: Arne Cleveland, MD;  Location: [facility];  Service: Anesthesiology;  Laterality: N/A;  . SHOULDER SURGERY Right     Family History  Problem Relation Age of Onset  . Diabetes Brother  deceased  . Heart disease Sister        A Fib  . Heart disease Mother        CHF  . Atrial fibrillation Sister   . Cancer Father        lung cancer   . Cancer Maternal Uncle        unknown type cancer   . Cancer Other        lung cancer   . Colon cancer Neg Hx     Social History:  reports that she quit smoking about 3 years ago. Her smoking use included cigarettes. She has a 50.00 pack-year smoking history. She has never used smokeless tobacco. She reports that she does not drink alcohol or use drugs.  Allergies:  Allergies  Allergen Reactions  . Atorvastatin Other (See Comments)    Unknown reaction  . Diphenhydramine Hcl (Sleep) Hives  . Hydrocodone-Acetaminophen Other (See Comments)    Unknown reaction  . Lopid [Gemfibrozil] Other (See  Comments)    Unknown reaction  . Loratadine Hives  . Lorazepam Hives  . Simvastatin Other (See Comments)    Unknown reaction  . Sulfamethoxazole Hives  . Sulfonamide Derivatives Hives    Medications:  Scheduled:  Continuous: . sodium chloride      Results for orders placed or performed during the hospital encounter of 01/19/18 (from the past 24 hour(s))  Type and screen [place]     Status: None (Preliminary result)   Collection Time: 01/19/18  2:09 PM  Result Value Ref Range   ABO/RH(D) B POS    Antibody Screen NEG    Sample Expiration      01/22/2018 Performed at [facility], [place] [address], [place],  [address]    Unit Number [id number]    Blood Component Type RED CELLS,LR    Unit division 00    Status of Unit ALLOCATED    Transfusion Status OK TO TRANSFUSE    Crossmatch Result Compatible    Unit Number [id number]    Blood Component Type RED CELLS,LR    Unit division 00    Status of Unit ALLOCATED    Transfusion Status OK TO TRANSFUSE    Crossmatch Result Compatible    Unit Number [id number]    Blood Component Type RED CELLS,LR    Unit division 00    Status of Unit ALLOCATED    Transfusion Status OK TO TRANSFUSE    Crossmatch Result Compatible   Comprehensive metabolic panel     Status: Abnormal   Collection Time: 01/19/18  4:09 PM  Result Value Ref Range   Sodium 135 135 - 145 mmol/L   Potassium 4.3 3.5 - 5.1 mmol/L   Chloride 99 (L) 101 - 111 mmol/L   CO2 28 22 - 32 mmol/L   Glucose, Bld 117 (H) 65 - 99 mg/dL   BUN 36 (H) 6 - 20 mg/dL   Creatinine, Ser 1.50 (H) 0.44 - 1.00 mg/dL   Calcium 8.4 (L) 8.9 - 10.3 mg/dL   Total Protein 4.9 (L) 6.5 - 8.1 g/dL   Albumin 2.5 (L) 3.5 - 5.0 g/dL   AST 29 15 - 41 U/L   ALT 20 14 - 54 U/L   Alkaline Phosphatase 106 38 - 126 U/L   Total Bilirubin 1.2 0.3 - 1.2 mg/dL   GFR calc non Af Amer 34 (L) >60 mL/min   GFR calc Af Amer 39 (L) >60 mL/min   Anion gap 8 5 -  15  CBC      Status: Abnormal   Collection Time: 01/19/18  4:09 PM  Result Value Ref Range   WBC 6.2 4.0 - 10.5 K/uL   RBC 2.22 (L) 3.87 - 5.11 MIL/uL   Hemoglobin 7.1 (L) 12.0 - 15.0 g/dL   HCT 23.9 (L) 36.0 - 46.0 %   MCV 107.7 (H) 78.0 - 100.0 fL   MCH 32.0 26.0 - 34.0 pg   MCHC 29.7 (L) 30.0 - 36.0 g/dL   RDW 16.9 (H) 11.5 - 15.5 %   Platelets 110 (L) 150 - 400 K/uL  I-Stat Troponin, ED (not at [facility])     Status: None   Collection Time: 01/19/18  4:17 PM  Result Value Ref Range   Troponin i, poc 0.00 0.00 - 0.08 ng/mL   Comment 3          POC occult blood, ED     Status: Abnormal   Collection Time: 01/19/18  4:19 PM  Result Value Ref Range   Fecal Occult Bld POSITIVE (A) NEGATIVE  Prepare RBC     Status: None   Collection Time: 01/19/18  6:08 PM  Result Value Ref Range   Order Confirmation      ORDER PROCESSED BY BLOOD BANK Performed at [facility], [address] [place], [place] [address]      No results found.  ROS:  As stated above in the HPI otherwise negative.  Blood pressure (!) 116/51, pulse 62, temperature 97.9 F (36.6 C), temperature source Oral, resp. rate 15, SpO2 100 %.    PE: Gen: NAD, Alert and Oriented HEENT:  [place]/AT, EOMI Neck: Supple, no LAD Lungs: CTA Bilaterally CV: RRR without M/G/R ABM: Soft, distended with ascites - unchanged, +BS Ext: 3+ edema  Assessment/Plan: 1) Progressive anemia with heme positive stool. 2) History of esophageal varices. 3) NASH cirrhosis. 4) [place].   With her progressive anemia and heme positive stool, an EGD will be required for further evaluation.  Her varices in the past were noted to be small, but she is overdue for an upper endoscopy.  Plan: 1) EGD tomorrow with Dr. Fuller Plan. 2) Keep HGB around 8-10 g/dL to keep the portal pressure down. 3) Start octreotide. 4) Ceftriaxone for prophylaxis in the setting of a cirrhotic with GI bleed. Kesha Hurrell D 01/19/2018, 6:17 PM

## 2018-01-19 NOTE — Progress Notes (Signed)
[name] 5 y  f patient admitted from ED via bed. Patient is alert and oriented x3. Vital signs are stable. Iv in place. Skin assessment done with another nurse. Patient given instruction about call bell and phone. Bed in low position and side rail up x3.. Call bell in reach.

## 2018-01-19 NOTE — H&P (Addendum)
History and Physical   [name] BMW:[id number] DOB: [birth year] DOA: 01/19/2018  Referring MD/NP/PA: Lita Mains EDP PCP: Unk Pinto, MD Outpatient Specialists: [facility]. Patient coming from: Home  Chief Complaint: Weakness, low hemoglobin  HPI: [name] is a 72 y.o. female with a history of nonalcoholic cirrhosis with ascites, recurrent pleural effusions, history of hepatic encephalopathy and esophageal varices s/p banding 2013, HCC s/p radiofrequency ablation, and severe COPD on 2-3L home O2 who presented to the ED at the direction of her PCP for hgb of 7.3 drawn at the office earlier this week as investigation into generalized weakness. She reports a couple weeks of increased lethargy, fatigue, and decreased exertional capacity described as gradual, worsening, and now severe, not improved despite taking her usual medications. She also reports several months of gradually worsening generalized R > L abdominal pain with increased abdominal girth and associated nausea without vomiting improved with antiemetic at home.   ED Course: Short of breath requiring 3L O2 and hypotensive, negative orthostatics. Hgb 7.1, down from previous at 7.3 and her baseline appearing to be ~9, macrocytic. Platelets stable and low at 110. Cr up from baseline at 1.5 with BUN 36. Albumin 2.5. No coags performed. ECG unchanged RBBB without ischemic features, troponin negative. + FOBT. Hospitalists called to admit further further evaluation.  Review of Systems: Reports darker than usual stools since she was directed to start po iron TID lately for anemia. No hematochezia, hematemesis, other unusual bleeding. Easily bruises at baseline. Husband reports increased pallor, no jaundice. No recent cognitive/memory changes, taking lactulose and other medications as directed. No focal weakness, numbness. And per HPI. All others reviewed and are negative.   Past Medical History:  Diagnosis Date  . Asthma    . Cancer [facility])    liver cancer   . COPD (chronic obstructive pulmonary disease) ([place])    x 3 years   . Depression   . Diabetes mellitus without complication ([place])    diet controlled  not on meds   . Edema    left leg   . Esophageal varices ([place])   . Fibromyalgia   . Gastritis   . GERD (gastroesophageal reflux disease)   . Hepatic cirrhosis ([place])   . Hepatic encephalopathy ([place])   . History of blood transfusion   . Hyperlipidemia   . Hypertension   . IBS (irritable bowel syndrome)   . Phlebitis    left leg x 2   . Pneumonia    hx of   . Splenomegaly   . Vitamin D deficiency    Past Surgical History:  Procedure Laterality Date  . ABDOMINAL HYSTERECTOMY    . BACK SURGERY    . CATARACT EXTRACTION, BILATERAL    . CHOLECYSTECTOMY    . ESOPHAGOGASTRODUODENOSCOPY  07/16/2012   Procedure: ESOPHAGOGASTRODUODENOSCOPY (EGD);  Surgeon: Inda Castle, MD;  Location: [facility];  Service: Endoscopy;  Laterality: N/A;  . GASTRIC VARICES BANDING  07/16/2012   Procedure: GASTRIC VARICES BANDING;  Surgeon: Inda Castle, MD;  Location: [facility] ENDOSCOPY;  Service: Endoscopy;  Laterality: N/A;  . HIP ARTHROPLASTY Bilateral   . IR GENERIC HISTORICAL  06/25/2016   IR RADIOLOGIST EVAL & MGMT 06/25/2016 [facility]  . IR GENERIC HISTORICAL  06/27/2016   IR KYPHO LUMBAR INC FX REDUCE BONE BX UNI/BIL CANNULATION INC/IMAGING 06/27/2016 Luanne Bras, MD [facility]  . IR GENERIC HISTORICAL  07/17/2016   IR RADIOLOGIST EVAL & MGMT 07/17/2016 [facility]  . IR RADIOLOGIST  EVAL & MGMT  07/10/2017  . IR RADIOLOGIST EVAL & MGMT  10/01/2017  . IR RADIOLOGIST EVAL & MGMT  12/11/2017  . IR THORACENTESIS ASP PLEURAL SPACE W/IMG GUIDE  09/12/2017  . IR THORACENTESIS ASP PLEURAL SPACE W/IMG GUIDE  09/17/2017  . NECK SURGERY    . RADIOFREQUENCY ABLATION N/A 08/30/2017   Procedure: CT MICROWAVE THERMAL ABLATION;  Surgeon: Arne Cleveland, MD;  Location: [facility] ORS;  Service: Anesthesiology;  Laterality:  N/A;  . SHOULDER SURGERY Right    - Extensive smoking history but none in recent years. No EtOH or drugs. Lives with husband.   reports that she quit smoking about 3 years ago. Her smoking use included cigarettes. She has a 50.00 pack-year smoking history. She has never used smokeless tobacco. She reports that she does not drink alcohol or use drugs. Allergies  Allergen Reactions  . Atorvastatin Other (See Comments)    Unknown reaction  . Diphenhydramine Hcl (Sleep) Hives  . Hydrocodone-Acetaminophen Other (See Comments)    Unknown reaction  . Lopid [Gemfibrozil] Other (See Comments)    Unknown reaction  . Loratadine Hives  . Lorazepam Hives  . Simvastatin Other (See Comments)    Unknown reaction  . Sulfamethoxazole Hives  . Sulfonamide Derivatives Hives   Family History  Problem Relation Age of Onset  . Diabetes Brother        deceased  . Heart disease Sister        A Fib  . Heart disease Mother        CHF  . Atrial fibrillation Sister   . Cancer Father        lung cancer   . Cancer Maternal Uncle        unknown type cancer   . Cancer Other        lung cancer   . Colon cancer Neg Hx    - Family history otherwise reviewed and not pertinent.  Prior to Admission medications   Medication Sig Start Date End Date Taking? Authorizing Provider  albuterol (PROVENTIL HFA;VENTOLIN HFA) 108 (90 Base) MCG/ACT inhaler Inhale 2 puffs into the lungs every 6 (six) hours as needed for wheezing or shortness of breath. 05/27/16   Ward, Delice Bison, DO  allopurinol (ZYLOPRIM) 300 MG tablet take 1 tablet (300 mg) by mouth daily with supper - for gout 09/28/17   Allie Bossier, MD  ALPRAZolam Duanne Moron) 0.25 MG tablet Take 1 tab as needed for anxiety, cramping, air hunger every 8 hours as needed. 11/01/17   Liane Comber, NP  aspirin EC 81 MG tablet Take 81 mg by mouth at bedtime.    [provider]  bisoprolol (ZEBETA) 10 MG tablet Take 1 tablet (10 mg total) by mouth daily. 10/17/17    Unk Pinto, MD  Cholecalciferol (VITAMIN D3) 5000 units CAPS Take 5,000 Units by mouth daily with breakfast.    [provider]  citalopram (CELEXA) 20 MG tablet Take 1 tablet (20 mg total) by mouth daily with supper. 09/28/17   Allie Bossier, MD  cyclobenzaprine (FLEXERIL) 10 MG tablet Take 1 tablet (10 mg total) by mouth 3 (three) times daily as needed for muscle spasms. 03/06/16   Unk Pinto, MD  ferrous sulfate 325 (65 FE) MG tablet Take 325 mg by mouth daily.     [provider]  gabapentin (NEURONTIN) 600 MG tablet Take 600 mg by mouth 3 (three) times daily.    [provider]  Glycopyrrolate-Formoterol (BEVESPI AEROSPHERE) 9-4.8 MCG/ACT AERO  Inhale 2 puffs into the lungs 2 (two) times daily. 12/25/17   Tanda Rockers, MD  lactulose (CHRONULAC) 10 GM/15ML solution take 11ms TWICE DAILY Patient taking differently: Take 10 g by mouth daily.  08/08/17   MUnk Pinto MD  Magnesium 250 MG TABS Take 250 mg by mouth at bedtime.     [provider]  metolazone (ZAROXOLYN) 5 MG tablet Take 5 mg by mouth daily as needed. Take 1/2-1 tab PRN for weight above 180 lb    [provider]  ondansetron (ZOFRAN) 4 MG tablet Take 1 tablet (4 mg total) by mouth every 6 (six) hours as needed for nausea or vomiting. 09/02/17   CVicie Mutters PA-C  OXYGEN Inhale 2-3 L into the lungs continuous. 3 L with exertion    [provider]  potassium chloride (K-DUR) 10 MEQ tablet Take 2 tablet 3 times a day. 11/18/17   MUnk Pinto MD  rifampin (RIFADIN) 300 MG capsule Take 1 capsule by mouth 2 (two) times daily.    [provider]  spironolactone (ALDACTONE) 25 MG tablet Take 25 mg by mouth 2 (two) times daily.    [provider]  torsemide (DEMADEX) 20 MG tablet Take 3 tablets 2 x/ day  Morning & Nite for fluid Retention & Swelling Patient taking differently: 1 tablet twice daily 11/25/17   MUnk Pinto MD  traMADol (Veatrice Bourbon 50  MG tablet Take 50 mg by mouth every 12 hours as needed for pain for chronic pain 11/18/17   CVicie Mutters PA-C    Physical Exam: Vitals:   01/19/18 1842 01/19/18 1845 01/19/18 1900 01/19/18 1904  BP:  (!) 106/37 (!) 110/41 (!) 111/41  Pulse:  69 66 67  Resp:  14 14 16   Temp: 97.9 F (36.6 C)   98.2 F (36.8 C)  TempSrc: Oral   Oral  SpO2:  100% (!) 81%    Constitutional: Drowsy elderly female in no distress, calm demeanor Eyes: Lids and conjunctivae normal, PERRL ENMT: Mucous membranes are tacky. Posterior pharynx clear of any exudate or lesions. Fair dentition.  Neck: normal, supple, no masses, no thyromegaly Respiratory: Non-labored breathing 3LPM without accessory muscle use. Clear breath sounds to auscultation bilaterally, decreased at bases. Cardiovascular: Regular rate and rhythm, soft early systolic murmur at apex, no rubs, or gallops. No carotid bruits. No JVD. Trace bilateral symmetric nonpitting LE edema. 2+ pedal pulses. Abdomen: Normoactive bowel sounds. Distended with minimal diffuse tenderness. No HSM. GU: No indwelling catheter Musculoskeletal: No clubbing / cyanosis. No joint deformity upper and lower extremities. Good ROM, no contractures. Normal muscle tone.  Skin: Warm, dry, diffuse pallor, no jaundice. Lower legs with chronic stasis phlebitis without tenderness, fluctuance, induration. PIV in left shoulder.  Neurologic: CN II-XII grossly intact. Gait not assessed. Speech normal, slow. No focal deficits in motor strength or sensation in all extremities, though diffusely weak.  Psychiatric: Alert and oriented x3. No asterixis. Normal judgment and insight. Mood euthymic with congruent affect.   Labs on Admission: I have personally reviewed following labs and imaging studies  CBC: Recent Labs  Lab 01/15/18 1733 01/16/18 1519 01/19/18 1609  WBC 5.9 5.2 6.2  NEUTROABS 4,101 3,739  --   HGB 7.8* 7.3* 7.1*  HCT 23.5* 24.0* 23.9*  MCV 96.3 104.3* 107.7*  PLT 122*  110* 1914   Basic Metabolic Panel: Recent Labs  Lab 01/15/18 1733 01/19/18 1609  NA 143 135  K 3.0* 4.3  CL 98 99*  CO2 36* 28  GLUCOSE 104* 117*  BUN 53* 36*  CREATININE 1.29* 1.50*  CALCIUM 9.0 8.4*   GFR: Estimated Creatinine Clearance: 33.4 mL/min (A) (by C-G formula based on SCr of 1.5 mg/dL (H)). Liver Function Tests: Recent Labs  Lab 01/15/18 1733 01/19/18 1609  AST 21 29  ALT 15 20  ALKPHOS  --  106  BILITOT 1.2 1.2  PROT 4.6* 4.9*  ALBUMIN  --  2.5*   No results for input(s): LIPASE, AMYLASE in the last 168 hours. Recent Labs  Lab 01/15/18 1733  AMMONIA 54   Coagulation Profile: Recent Labs  Lab 01/15/18 1733  INR 1.1   Urine analysis:    Component Value Date/Time   COLORURINE DARK YELLOW 01/16/2018 1606   APPEARANCEUR CLOUDY (A) 01/16/2018 1606   LABSPEC 1.011 01/16/2018 1606   PHURINE 7.0 01/16/2018 1606   GLUCOSEU NEGATIVE 01/16/2018 1606   HGBUR NEGATIVE 01/16/2018 1606   BILIRUBINUR NEGATIVE 09/16/2017 [place] NEGATIVE 01/16/2018 1606   PROTEINUR NEGATIVE 01/16/2018 1606   UROBILINOGEN 1 01/13/2015 1145   NITRITE NEGATIVE 09/16/2017 1922   LEUKOCYTESUR TRACE (A) 09/16/2017 1922   Recent Results (from the past 240 hour(s))  Urine Culture     Status: None   Collection Time: 01/16/18  4:06 PM  Result Value Ref Range Status   MICRO NUMBER: [id number]  Final   SPECIMEN QUALITY: ADEQUATE  Final   Sample Source URINE  Final   STATUS: FINAL  Final   Result:   Final    Three or more organisms present, each greater than 10,000 cu/mL. May represent normal flora contamination from external genitalia. No further testing is required.     EKG: Independently reviewed. NSR w/RBBB no ischemic changes.  Assessment/Plan Principal Problem:   Acute blood loss anemia Active Problems:   Symptomatic anemia   Esophageal varices (HCC)   Essential hypertension   Thrombocytopenia (HCC)   COPD GOLD IV/ 02 dep    Obesity (BMI 30-39.9)    Hepatocellular carcinoma ([place])   Obesity hypoventilation syndrome (HCC)   Liver cirrhosis secondary to nonalcoholic steatohepatitis (NASH) (HCC)   Chronic diastolic CHF (congestive heart failure) (HCC)   Macrocytic anemia   CKD (chronic kidney disease), stage III (HCC)   Chronic respiratory failure with hypoxia and hypercapnia (HCC)   Acute blood loss anemia on chronic iron deficiency anemia: No gross blood loss reported, though has had vomiting and +FOBT with known h/o esophageal varices seen on last EGD w/Dr. Deatra Ina. Colonoscopy was 2009 w/hyperplastic polyps and no diverticulosis noted.  - With hypotension and baseline morbid status, would benefit from 1u PRBCs. Type & screen done, asked EDP to order. Monitor volume status with G2DD on echo Feb 2019 and EF 60-65%.  - Check coags, last INR 1.1.  - Monitor CBC in AM and vital signs closely in SDU.  - Macrocytic, though B12 and folate wnl. Iron and % sat low on recent lab work. Would benefit from IV iron during hospitalization.  Suspected GI bleeding: Known esophageal varices and gastritis.  - Consult Dr. Benson Norway for [facility]. Anticipate EGD. - Keep NPO pending recommendations - Stop aspirin - Protonix IV, octreotide.    [facility] Dx Nov 2018, NASH cirrhosis, history of hepatic encephalopathy: No indication of SBP without fever, leukocytosis, mental status changes, and minimal abdominal tenderness.  - Hold tonight's doses of torsemide, spironolactone, and potassium. Reports increasing girth with discontinuation of metolazone.  - Will order diagnostic and therapeutic paracentesis (previously no pockets found, but describes increased  abdominal girth lately) - Continue lactulose and rifaximin.   Acute on chronic hypoxic respiratory failure due to symptomatic anemia and severe COPD: Up to 3L from home 2LPM. FEV1 was found to be 1L with FEV1 of 0.51. Also suspect element of OHS, OSA but no sleep study documented. Exam not consistent with recurrence of  pleural effusion thankfully.  - Continue supplemental oxygen to maintain SpO2 88-95% - Continue home COPD medications - BiPAP qHS   Chronic diastolic CHF: N3ZJ on echo.  - Monitor I/O, daily weights, restart diuretics as able.   Thrombocytopenia: Chronic, stable.  - Monitor  HTN: Hypotensive on admission.  - Hold antihypertensives  RLS:  - Continue home medications  AKI on stage III CKD: Suspect due to decreased EABV w/anemia.  - Monitor in AM - Avoid nephrotoxins - Hold diuretics tonight as above  DVT prophylaxis: SCDs  Code Status: DNR  Family Communication: Husband at bedside Disposition Plan: SDU Consults called: Dr. Benson Norway, GI  Admission status: Inpatient    Patrecia Pour, MD [facility] [email or web address] Password [email or web address] 01/19/2018, 7:09 PM

## 2018-01-19 NOTE — ED Notes (Signed)
Attempted report x1. 

## 2018-01-19 NOTE — ED Provider Notes (Addendum)
[place] EMERGENCY DEPARTMENT Provider Note  CSN: [id number] Arrival date & time: 01/19/18  1508  History   Chief Complaint Chief Complaint  Patient presents with  . Weakness  . Abnormal Lab   HPI [name] is a 72 y.o. female with a medical history of COPD, Type 2 DM, HTN, CKD stage 3, HF, IBS, liver cancer and cirrhosis who presented to the ED via her PCP for anemia. Patient went to PCP on 01/16/18 and her Hgb resulted at 7.3. She endorses weakness and fatigue that have occurred over the last couple of months. She endorses being sick this weak with abdominal pain, nausea and vomiting. She takes lactulose daily so her stools are always loose. Denies hematochezia/melena, hematemesis or vaginal bleeding. Endorses epistaxis x1 this morning. Denies chest pain, SOB, palpitations and leg swelling.  Additional history obtained by medical chart. Seen at PCP on 01/15/18 for nausea and fatigue where it was discovered that Hgb was 7.3 Pt's baseline Hgb is 10.5-11.0 last recorded at this level in early 2018.  Past Medical History:  Diagnosis Date  . Asthma   . Cancer [facility]    liver cancer   . COPD (chronic obstructive pulmonary disease) ([place])    x 3 years   . Depression   . Diabetes mellitus without complication ([place])    diet controlled  not on meds   . Edema    left leg   . Esophageal varices ([place])   . Fibromyalgia   . Gastritis   . GERD (gastroesophageal reflux disease)   . Hepatic cirrhosis ([place])   . Hepatic encephalopathy ([place])   . History of blood transfusion   . Hyperlipidemia   . Hypertension   . IBS (irritable bowel syndrome)   . Phlebitis    left leg x 2   . Pneumonia    hx of   . Splenomegaly   . Vitamin D deficiency     Patient Active Problem List   Diagnosis Date Noted  . Chronic respiratory failure with hypoxia and hypercapnia ([place]) 12/26/2017  . Obesity hypoventilation syndrome ([place])   . Recurrent right pleural effusion   . NASH  (nonalcoholic steatohepatitis)   . Liver cirrhosis secondary to nonalcoholic steatohepatitis (NASH) ([place])   . Chronic diastolic CHF (congestive heart failure) ([place])   . Macrocytic anemia   . CKD (chronic kidney disease), stage III ([place])   . Ascites 09/07/2017  . Hepatocellular carcinoma ([place]) 08/30/2017  . Anasarca 12/14/2016  . Obesity (BMI 30-39.9) 10/12/2016  . Hyperammonemia ([place]) 10/12/2016  . Atrial tachycardia, multifocal ([place])   . Chronic respiratory failure ([place]) 01/19/2016  . Hepatic encephalopathy ([place]) 09/21/2015  . Hypokalemia 09/21/2015  . COPD GOLD IV/ 02 dep  08/30/2015  . Thrombocytopenia ([place]) 08/07/2015  . Venous insufficiency 07/26/2015  . Goals of care, counseling/discussion 12/14/2013  . Essential hypertension   . Hyperlipidemia   . GERD   . Vitamin D deficiency   . IBS   . Fibromyalgia   . Esophageal varices ([place]) 07/08/2012  . COLONIC POLYPS 11/11/2008  . Anemia 05/28/2008  . Esophagitis 05/27/2008  . Diverticulosis of large intestine 05/27/2008    Past Surgical History:  Procedure Laterality Date  . ABDOMINAL HYSTERECTOMY    . BACK SURGERY    . CATARACT EXTRACTION, BILATERAL    . CHOLECYSTECTOMY    . ESOPHAGOGASTRODUODENOSCOPY  07/16/2012   Procedure: ESOPHAGOGASTRODUODENOSCOPY (EGD);  Surgeon: Inda Castle, MD;  Location: [facility];  Service: Endoscopy;  Laterality: N/A;  .  GASTRIC VARICES BANDING  07/16/2012   Procedure: GASTRIC VARICES BANDING;  Surgeon: Inda Castle, MD;  Location: [facility];  Service: Endoscopy;  Laterality: N/A;  . HIP ARTHROPLASTY Bilateral   . IR GENERIC HISTORICAL  06/25/2016   IR RADIOLOGIST EVAL & MGMT 06/25/2016 [facility]  . IR GENERIC HISTORICAL  06/27/2016   IR KYPHO LUMBAR INC FX REDUCE BONE BX UNI/BIL CANNULATION INC/IMAGING 06/27/2016 Luanne Bras, MD [facility]  . IR GENERIC HISTORICAL  07/17/2016   IR RADIOLOGIST EVAL & MGMT 07/17/2016 [facility]  . IR RADIOLOGIST EVAL & MGMT   07/10/2017  . IR RADIOLOGIST EVAL & MGMT  10/01/2017  . IR RADIOLOGIST EVAL & MGMT  12/11/2017  . IR THORACENTESIS ASP PLEURAL SPACE W/IMG GUIDE  09/12/2017  . IR THORACENTESIS ASP PLEURAL SPACE W/IMG GUIDE  09/17/2017  . NECK SURGERY    . RADIOFREQUENCY ABLATION N/A 08/30/2017   Procedure: CT MICROWAVE THERMAL ABLATION;  Surgeon: Arne Cleveland, MD;  Location: [facility]S;  Service: Anesthesiology;  Laterality: N/A;  . SHOULDER SURGERY Right      OB History   None      Home Medications    Prior to Admission medications   Medication Sig Start Date End Date Taking? Authorizing Provider  albuterol (PROVENTIL HFA;VENTOLIN HFA) 108 (90 Base) MCG/ACT inhaler Inhale 2 puffs into the lungs every 6 (six) hours as needed for wheezing or shortness of breath. 05/27/16   Ward, Delice Bison, DO  allopurinol (ZYLOPRIM) 300 MG tablet take 1 tablet (300 mg) by mouth daily with supper - for gout 09/28/17   Allie Bossier, MD  ALPRAZolam Duanne Moron) 0.25 MG tablet Take 1 tab as needed for anxiety, cramping, air hunger every 8 hours as needed. 11/01/17   Liane Comber, NP  aspirin EC 81 MG tablet Take 81 mg by mouth at bedtime.    [provider]  bisoprolol (ZEBETA) 10 MG tablet Take 1 tablet (10 mg total) by mouth daily. 10/17/17   Unk Pinto, MD  Cholecalciferol (VITAMIN D3) 5000 units CAPS Take 5,000 Units by mouth daily with breakfast.    [provider]  citalopram (CELEXA) 20 MG tablet Take 1 tablet (20 mg total) by mouth daily with supper. 09/28/17   Allie Bossier, MD  cyclobenzaprine (FLEXERIL) 10 MG tablet Take 1 tablet (10 mg total) by mouth 3 (three) times daily as needed for muscle spasms. 03/06/16   Unk Pinto, MD  ferrous sulfate 325 (65 FE) MG tablet Take 325 mg by mouth daily.     [provider]  gabapentin (NEURONTIN) 600 MG tablet Take 600 mg by mouth 3 (three) times daily.    [provider]  Glycopyrrolate-Formoterol (BEVESPI AEROSPHERE) 9-4.8 MCG/ACT  AERO Inhale 2 puffs into the lungs 2 (two) times daily. 12/25/17   Tanda Rockers, MD  lactulose (CHRONULAC) 10 GM/15ML solution take 12ms TWICE DAILY Patient taking differently: Take 10 g by mouth daily.  08/08/17   MUnk Pinto MD  Magnesium 250 MG TABS Take 250 mg by mouth at bedtime.     [provider]  metolazone (ZAROXOLYN) 5 MG tablet Take 5 mg by mouth daily as needed. Take 1/2-1 tab PRN for weight above 180 lb    [provider]  ondansetron (ZOFRAN) 4 MG tablet Take 1 tablet (4 mg total) by mouth every 6 (six) hours as needed for nausea or vomiting. 09/02/17   CVicie Mutters PA-C  OXYGEN Inhale 2-3 L into the lungs continuous. 3  L with exertion    [provider]  potassium chloride (K-DUR) 10 MEQ tablet Take 2 tablet 3 times a day. 11/18/17   Unk Pinto, MD  rifampin (RIFADIN) 300 MG capsule Take 1 capsule by mouth 2 (two) times daily.    [provider]  spironolactone (ALDACTONE) 25 MG tablet Take 25 mg by mouth 2 (two) times daily.    [provider]  torsemide (DEMADEX) 20 MG tablet Take 3 tablets 2 x/ day  Morning & Nite for fluid Retention & Swelling Patient taking differently: 1 tablet twice daily 11/25/17   Unk Pinto, MD  traMADol Veatrice Bourbon) 50 MG tablet Take 50 mg by mouth every 12 hours as needed for pain for chronic pain 11/18/17   Vicie Mutters, PA-C    Family History Family History  Problem Relation Age of Onset  . Diabetes Brother        deceased  . Heart disease Sister        A Fib  . Heart disease Mother        CHF  . Atrial fibrillation Sister   . Cancer Father        lung cancer   . Cancer Maternal Uncle        unknown type cancer   . Cancer Other        lung cancer   . Colon cancer Neg Hx     Social History Social History   Tobacco Use  . Smoking status: Former Smoker    Packs/day: 1.00    Years: 50.00    Pack years: 50.00    Types: Cigarettes    Last attempt to quit: 11/05/2014     Years since quitting: 3.2  . Smokeless tobacco: Never Used  . Tobacco comment: Quit April 2016  Substance Use Topics  . Alcohol use: No    Alcohol/week: 0.0 oz  . Drug use: No     Allergies   Atorvastatin; Diphenhydramine hcl (sleep); Hydrocodone-acetaminophen; Lopid [gemfibrozil]; Loratadine; Lorazepam; Simvastatin; Sulfamethoxazole; and Sulfonamide derivatives   Review of Systems Review of Systems  Constitutional: Positive for activity change and fatigue. Negative for chills, diaphoresis and fever.  Eyes: Negative for visual disturbance.  Respiratory: Negative for cough, chest tightness, shortness of breath and wheezing.   Cardiovascular: Negative for chest pain, palpitations and leg swelling.  Gastrointestinal: Positive for abdominal pain, nausea and vomiting. Negative for constipation and diarrhea.  Endocrine: Negative.   Genitourinary: Negative.  Negative for dysuria.  Skin: Negative.   Neurological: Positive for weakness and light-headedness. Negative for dizziness, syncope, numbness and headaches.  Hematological: Bruises/bleeds easily.     Physical Exam Updated Vital Signs BP (!) 116/51 (BP Location: Left Arm)   Pulse 62   Temp 97.9 F (36.6 C) (Oral)   Resp 15   SpO2 100%   Physical Exam  Constitutional: Vital signs are normal. She is cooperative. She appears ill. Nasal cannula in place.  HENT:  Mouth/Throat: Uvula is midline. Mucous membranes are dry.  Cardiovascular: Normal rate, regular rhythm, normal heart sounds, intact distal pulses and normal pulses.  Pulmonary/Chest: Effort normal and breath sounds normal.  Abdominal: Normal appearance. She exhibits distension. Bowel sounds are decreased. There is hepatomegaly. There is no tenderness.  Dullness to percussion throughout abdomen.  Genitourinary: Rectum normal. Rectal exam shows anal tone normal.  Genitourinary Comments: Loose, dark brown stool in rectal vault.  Neurological: She is alert.  Skin: Skin is  warm, dry and intact. Rash noted. Rash is nodular.  Shins are ruddy brown in color consistent with chronic venous stasis.  Nursing note and vitals reviewed.    ED Treatments / Results  Labs (all labs ordered are listed, but only abnormal results are displayed) Labs Reviewed  COMPREHENSIVE METABOLIC PANEL - Abnormal; Notable for the following components:      Result Value   Chloride 99 (*)    Glucose, Bld 117 (*)    BUN 36 (*)    Creatinine, Ser 1.50 (*)    Calcium 8.4 (*)    Total Protein 4.9 (*)    Albumin 2.5 (*)    GFR calc non Af Amer 34 (*)    GFR calc Af Amer 39 (*)    All other components within normal limits  CBC - Abnormal; Notable for the following components:   RBC 2.22 (*)    Hemoglobin 7.1 (*)    HCT 23.9 (*)    MCV 107.7 (*)    MCHC 29.7 (*)    RDW 16.9 (*)    Platelets 110 (*)    All other components within normal limits  POC OCCULT BLOOD, ED - Abnormal; Notable for the following components:   Fecal Occult Bld POSITIVE (*)    All other components within normal limits  I-STAT TROPONIN, ED  TYPE AND SCREEN  PREPARE RBC (CROSSMATCH)    EKG None  Radiology No results found.  Procedures Procedures (including critical care time)  Medications Ordered in ED Medications  0.9 %  sodium chloride infusion (has no administration in time range)     Initial Impression / Assessment and Plan / ED Course  Triage vital signs and the nursing notes have been reviewed.  Pertinent labs & imaging results that were available during care of the patient were reviewed and considered in medical decision making (see chart for details).  Patient presents ill appearing and endorsing symptoms of weakness. She is on 2.5L of [place] at home, but is requiring 3L today in the ED. Patient presents from the PCP with concerns of an acute blood loss leading to anemia. GI blood loss confirmed with labs and pt's Hgb continues to trend downward. Patient's vitals continue to remain  stable.  Clinical Course as of Jan 19 1745  Sun Jan 19, 2018  1646 Hgb 7.1 today which is slightly decreased from 7.3 on 01/16/18 at PCP. Fecal occult + for blood. Blood loss site identified. Will place consult to [facility] for admission to further evaluate and treat acute GI blood loss leading to symptomatic anemia.   [GM]  1701 EKG NSR. RBBB which is consistent with last EKG on 09/19/17. No ST elevations/depressions. No acute infarct or ischemia. This is reassuring in combination with negative troponin which assists in evaluating and ruling out an acute cardiac process.   [GM]  1739 Case discussed with [facility]. Will order 1 unit of blood for transfusion.   [GM]    Clinical Course User Index [GM] Romie Jumper, PA-C   Patient requires inpatient hospitalization for further treatment and evaluation of her acute GI blood loss that is leading to this symptomatic anemia. Case discussed with Dr. Julianne Rice who is in agreement with plan.   Final Clinical Impressions(s) / ED Diagnoses  1. GI Blood Loss. NS bolus and 1 unit of RBCs for blood transfusion in the ED.  Dispo: Admit. Consult placed with [facility].  Final diagnoses:  Gastrointestinal hemorrhage, unspecified gastrointestinal hemorrhage type    ED Discharge Orders    None  Junita Push 01/19/18 1746    Julianne Rice, MD 01/19/18 1818    Maury Dus I, PA-C 01/19/18 [place]a Iha    Julianne Rice, MD 01/28/18 [phone]

## 2018-01-19 NOTE — ED Notes (Signed)
Attempted report x 2 

## 2018-01-19 NOTE — Consult Note (Addendum)
Consult Note for [facility]  Reason for Consult: Anemia, cirrhosis, history of esophageal varices, heme positive stool Referring Physician: [facility].  [name] HPI: This is a 72 year old female with a PMH of NASH cirrhosis with [place] s/p ablative therapy, COPD, DM, HTN, stage 3 CKD who reported issues with feeling week on 01/15/2018.  Blood work was obtained and she was noted to have an HGB of 7.1 g/dL today.  Her blood work on 01/15/2018 and 01/16/2018 was at 7.8 and 7.3, respectively.  Her baseline HGB appears to be between 8-9.  Her last EGD was with Dr. Deatra Ina on 10/22/2013 with findings of esophageal varices and portal HTN gastropathy.  Her esophageal varices were always graded as a 1 or 2.  She last saw Dr. Havery Moros on 12/10/2017 for a routine follow up.  There is a history of ascites and she currently takes torsemide 60 mg BID.  Her husband reports that she has dark stools from the iron supplementation that she takes, but he thinks it may be darker of late.  Past Medical History:  Diagnosis Date  . Asthma   . Cancer [facility])    liver cancer   . COPD (chronic obstructive pulmonary disease) ([place])    x 3 years   . Depression   . Diabetes mellitus without complication ([place])    diet controlled  not on meds   . Edema    left leg   . Esophageal varices ([place])   . Fibromyalgia   . Gastritis   . GERD (gastroesophageal reflux disease)   . Hepatic cirrhosis ([place])   . Hepatic encephalopathy ([place])   . History of blood transfusion   . Hyperlipidemia   . Hypertension   . IBS (irritable bowel syndrome)   . Phlebitis    left leg x 2   . Pneumonia    hx of   . Splenomegaly   . Vitamin D deficiency     Past Surgical History:  Procedure Laterality Date  . ABDOMINAL HYSTERECTOMY    . BACK SURGERY    . CATARACT EXTRACTION, BILATERAL    . CHOLECYSTECTOMY    . ESOPHAGOGASTRODUODENOSCOPY  07/16/2012   Procedure: ESOPHAGOGASTRODUODENOSCOPY (EGD);  Surgeon: Inda Castle, MD;   Location: [facility];  Service: Endoscopy;  Laterality: N/A;  . GASTRIC VARICES BANDING  07/16/2012   Procedure: GASTRIC VARICES BANDING;  Surgeon: Inda Castle, MD;  [facility];  Service: Endoscopy;  Laterality: N/A;  . HIP ARTHROPLASTY Bilateral   . IR GENERIC HISTORICAL  06/25/2016   IR RADIOLOGIST EVAL & MGMT 06/25/2016 [facility]  . IR GENERIC HISTORICAL  06/27/2016   IR KYPHO LUMBAR INC FX REDUCE BONE BX UNI/BIL CANNULATION INC/IMAGING 06/27/2016 Luanne Bras, MD [facility]  . IR GENERIC HISTORICAL  07/17/2016   IR RADIOLOGIST EVAL & MGMT 07/17/2016 [facility]  . IR RADIOLOGIST EVAL & MGMT  07/10/2017  . IR RADIOLOGIST EVAL & MGMT  10/01/2017  . IR RADIOLOGIST EVAL & MGMT  12/11/2017  . IR THORACENTESIS ASP PLEURAL SPACE W/IMG GUIDE  09/12/2017  . IR THORACENTESIS ASP PLEURAL SPACE W/IMG GUIDE  09/17/2017  . NECK SURGERY    . RADIOFREQUENCY ABLATION N/A 08/30/2017   Procedure: CT MICROWAVE THERMAL ABLATION;  Surgeon: Arne Cleveland, MD;  [facility];  Service: Anesthesiology;  Laterality: N/A;  . SHOULDER SURGERY Right     Family History  Problem Relation Age of Onset  . Diabetes Brother  deceased  . Heart disease Sister        A Fib  . Heart disease Mother        CHF  . Atrial fibrillation Sister   . Cancer Father        lung cancer   . Cancer Maternal Uncle        unknown type cancer   . Cancer Other        lung cancer   . Colon cancer Neg Hx     Social History:  reports that she quit smoking about 3 years ago. Her smoking use included cigarettes. She has a 50.00 pack-year smoking history. She has never used smokeless tobacco. She reports that she does not drink alcohol or use drugs.  Allergies:  Allergies  Allergen Reactions  . Atorvastatin Other (See Comments)    Unknown reaction  . Diphenhydramine Hcl (Sleep) Hives  . Hydrocodone-Acetaminophen Other (See Comments)    Unknown reaction  . Lopid [Gemfibrozil] Other (See  Comments)    Unknown reaction  . Loratadine Hives  . Lorazepam Hives  . Simvastatin Other (See Comments)    Unknown reaction  . Sulfamethoxazole Hives  . Sulfonamide Derivatives Hives    Medications:  Scheduled:  Continuous: . sodium chloride      Results for orders placed or performed during the hospital encounter of 01/19/18 (from the past 24 hour(s))  Type and screen [place]     Status: None (Preliminary result)   Collection Time: 01/19/18  2:09 PM  Result Value Ref Range   ABO/RH(D) B POS    Antibody Screen NEG    Sample Expiration      01/22/2018 Performed at [facility], [place] [address], [place], [place] [address]    Unit Number [id number]    Blood Component Type RED CELLS,LR    Unit division 00    Status of Unit ALLOCATED    Transfusion Status OK TO TRANSFUSE    Crossmatch Result Compatible    Unit Number [id number]    Blood Component Type RED CELLS,LR    Unit division 00    Status of Unit ALLOCATED    Transfusion Status OK TO TRANSFUSE    Crossmatch Result Compatible    Unit Number [id number]    Blood Component Type RED CELLS,LR    Unit division 00    Status of Unit ALLOCATED    Transfusion Status OK TO TRANSFUSE    Crossmatch Result Compatible   Comprehensive metabolic panel     Status: Abnormal   Collection Time: 01/19/18  4:09 PM  Result Value Ref Range   Sodium 135 135 - 145 mmol/L   Potassium 4.3 3.5 - 5.1 mmol/L   Chloride 99 (L) 101 - 111 mmol/L   CO2 28 22 - 32 mmol/L   Glucose, Bld 117 (H) 65 - 99 mg/dL   BUN 36 (H) 6 - 20 mg/dL   Creatinine, Ser 1.50 (H) 0.44 - 1.00 mg/dL   Calcium 8.4 (L) 8.9 - 10.3 mg/dL   Total Protein 4.9 (L) 6.5 - 8.1 g/dL   Albumin 2.5 (L) 3.5 - 5.0 g/dL   AST 29 15 - 41 U/L   ALT 20 14 - 54 U/L   Alkaline Phosphatase 106 38 - 126 U/L   Total Bilirubin 1.2 0.3 - 1.2 mg/dL   GFR calc non Af Amer 34 (L) >60 mL/min   GFR calc Af Amer 39 (L) >60 mL/min   Anion gap 8 5 -  15  CBC      Status: Abnormal   Collection Time: 01/19/18  4:09 PM  Result Value Ref Range   WBC 6.2 4.0 - 10.5 K/uL   RBC 2.22 (L) 3.87 - 5.11 MIL/uL   Hemoglobin 7.1 (L) 12.0 - 15.0 g/dL   HCT 23.9 (L) 36.0 - 46.0 %   MCV 107.7 (H) 78.0 - 100.0 fL   MCH 32.0 26.0 - 34.0 pg   MCHC 29.7 (L) 30.0 - 36.0 g/dL   RDW 16.9 (H) 11.5 - 15.5 %   Platelets 110 (L) 150 - 400 K/uL  I-Stat Troponin, ED (not at [facility])     Status: None   Collection Time: 01/19/18  4:17 PM  Result Value Ref Range   Troponin i, poc 0.00 0.00 - 0.08 ng/mL   Comment 3          POC occult blood, ED     Status: Abnormal   Collection Time: 01/19/18  4:19 PM  Result Value Ref Range   Fecal Occult Bld POSITIVE (A) NEGATIVE  Prepare RBC     Status: None   Collection Time: 01/19/18  6:08 PM  Result Value Ref Range   Order Confirmation      ORDER PROCESSED BY BLOOD BANK Performed at [facility], [address], [place], [place] [address]      No results found.  ROS:  As stated above in the HPI otherwise negative.  Blood pressure (!) 116/51, pulse 62, temperature 97.9 F (36.6 C), temperature source Oral, resp. rate 15, SpO2 100 %.    PE: Gen: NAD, Alert and Oriented HEENT:  [place]/AT, EOMI Neck: Supple, no LAD Lungs: CTA Bilaterally CV: RRR without M/G/R ABM: Soft, distended with ascites - unchanged, +BS Ext: 3+ edema  Assessment/Plan: 1) Progressive anemia with heme positive stool. 2) History of esophageal varices. 3) NASH cirrhosis. 4) [place].   With her progressive anemia and heme positive stool, an EGD will be required for further evaluation.  Her varices in the past were noted to be small, but she is overdue for an upper endoscopy.  Plan: 1) EGD tomorrow with Dr. Fuller Plan. 2) Keep HGB around 8-10 g/dL to keep the portal pressure down. 3) Start octreotide. 4) Ceftriaxone for prophylaxis in the setting of a cirrhotic with GI bleed. Mylah Baynes D 01/19/2018, 6:17 PM

## 2018-01-19 NOTE — Progress Notes (Addendum)
[place] held at this time, not able to obtain from pyxis due to next medication available at 0800. No distress noted RCP will continue to follow.    Duoneb PRN given at 2336.  Patient does not want to wear CPAP tonight, states she does not wear it mostly at home, RCP will continue to follow.

## 2018-01-19 NOTE — Telephone Encounter (Signed)
Called patient's husband to notify of Hgb further decline from 7.8 to 7.3 - advised to present to ED for stat labs to recheck for further decline, GI bleed workup, transfusion.

## 2018-01-19 NOTE — ED Triage Notes (Signed)
Pt states she was sent here by PCP for hgb of 7 with associated symptoms of weakness and fatigue. She denies any obvious blood in stools. Does not take blood thinners.

## 2018-01-20 ENCOUNTER — Inpatient Hospital Stay (HOSPITAL_COMMUNITY): Payer: PPO | Admitting: Certified Registered Nurse Anesthetist

## 2018-01-20 ENCOUNTER — Encounter (HOSPITAL_COMMUNITY): Payer: Self-pay | Admitting: *Deleted

## 2018-01-20 ENCOUNTER — Encounter (HOSPITAL_COMMUNITY)
Admission: EM | Disposition: A | Discharge status: Home Health | Payer: Self-pay | Source: Home / Self Care | Attending: Family Medicine

## 2018-01-20 ENCOUNTER — Inpatient Hospital Stay (HOSPITAL_COMMUNITY): Payer: PPO

## 2018-01-20 DIAGNOSIS — K3189 Other diseases of stomach and duodenum: Secondary | ICD-10-CM

## 2018-01-20 DIAGNOSIS — D500 Iron deficiency anemia secondary to blood loss (chronic): Secondary | ICD-10-CM

## 2018-01-20 DIAGNOSIS — K766 Portal hypertension: Principal | ICD-10-CM

## 2018-01-20 DIAGNOSIS — I8500 Esophageal varices without bleeding: Secondary | ICD-10-CM

## 2018-01-20 DIAGNOSIS — R195 Other fecal abnormalities: Secondary | ICD-10-CM

## 2018-01-20 HISTORY — PX: IR PARACENTESIS: IMG2679

## 2018-01-20 HISTORY — PX: ESOPHAGOGASTRODUODENOSCOPY: SHX5428

## 2018-01-20 LAB — BODY FLUID CELL COUNT WITH DIFFERENTIAL
Eos, Fluid: 0 %
LYMPHS FL: 54 %
Monocyte-Macrophage-Serous Fluid: 34 % — ABNORMAL LOW (ref 50–90)
Neutrophil Count, Fluid: 12 % (ref 0–25)
Total Nucleated Cell Count, Fluid: 135 cu mm (ref 0–1000)

## 2018-01-20 LAB — PROTIME-INR
INR: 1.17
PROTHROMBIN TIME: 14.8 s (ref 11.4–15.2)

## 2018-01-20 LAB — CBC
HCT: 23.7 % — ABNORMAL LOW (ref 36.0–46.0)
HCT: 25.7 % — ABNORMAL LOW (ref 36.0–46.0)
Hemoglobin: 7.1 g/dL — ABNORMAL LOW (ref 12.0–15.0)
Hemoglobin: 7.8 g/dL — ABNORMAL LOW (ref 12.0–15.0)
MCH: 31.2 pg (ref 26.0–34.0)
MCH: 31.6 pg (ref 26.0–34.0)
MCHC: 30 g/dL (ref 30.0–36.0)
MCHC: 30.4 g/dL (ref 30.0–36.0)
MCV: 105.3 fL — ABNORMAL HIGH (ref 78.0–100.0)
MCV: 102.8 fL — AB (ref 78.0–100.0)
PLATELETS: 94 10*3/uL — AB (ref 150–400)
PLATELETS: 86 10*3/uL — AB (ref 150–400)
RBC: 2.5 MIL/uL — ABNORMAL LOW (ref 3.87–5.11)
RBC: 2.25 MIL/uL — AB (ref 3.87–5.11)
RDW: 20.1 % — ABNORMAL HIGH (ref 11.5–15.5)
RDW: 19.8 % — AB (ref 11.5–15.5)
WBC: 4.4 10*3/uL (ref 4.0–10.5)
WBC: 4.6 10*3/uL (ref 4.0–10.5)

## 2018-01-20 LAB — COMPREHENSIVE METABOLIC PANEL
ALK PHOS: 78 U/L (ref 38–126)
ALT: 16 U/L (ref 14–54)
ANION GAP: 43 — AB (ref 5–15)
AST: 30 U/L (ref 15–41)
Albumin: 2.1 g/dL — ABNORMAL LOW (ref 3.5–5.0)
BILIRUBIN TOTAL: 2.5 mg/dL — AB (ref 0.3–1.2)
BUN: 30 mg/dL — ABNORMAL HIGH (ref 6–20)
CALCIUM: 6.5 mg/dL — AB (ref 8.9–10.3)
CO2: 22 mmol/L (ref 22–32)
CREATININE: 1.28 mg/dL — AB (ref 0.44–1.00)
Chloride: 86 mmol/L — ABNORMAL LOW (ref 101–111)
GFR, EST AFRICAN AMERICAN: 48 mL/min — AB (ref >60)
GFR, EST NON AFRICAN AMERICAN: 41 mL/min — AB (ref >60)
Glucose, Bld: 77 mg/dL (ref 65–99)
Potassium: 3.5 mmol/L (ref 3.5–5.1)
Sodium: 151 mmol/L — ABNORMAL HIGH (ref 135–145)
TOTAL PROTEIN: 4 g/dL — AB (ref 6.5–8.1)

## 2018-01-20 LAB — BASIC METABOLIC PANEL
Anion gap: 7 (ref 5–15)
BUN: 30 mg/dL — AB (ref 6–20)
CALCIUM: 8 mg/dL — AB (ref 8.9–10.3)
CHLORIDE: 103 mmol/L (ref 101–111)
CO2: 28 mmol/L (ref 22–32)
CREATININE: 1.34 mg/dL — AB (ref 0.44–1.00)
GFR calc Af Amer: 45 mL/min — ABNORMAL LOW (ref >60)
GFR calc non Af Amer: 39 mL/min — ABNORMAL LOW (ref >60)
Glucose, Bld: 79 mg/dL (ref 65–99)
Potassium: 3.9 mmol/L (ref 3.5–5.1)
SODIUM: 138 mmol/L (ref 135–145)

## 2018-01-20 LAB — GRAM STAIN

## 2018-01-20 LAB — MRSA PCR SCREENING: MRSA by PCR: NEGATIVE

## 2018-01-20 LAB — ALBUMIN, PLEURAL OR PERITONEAL FLUID

## 2018-01-20 SURGERY — EGD (ESOPHAGOGASTRODUODENOSCOPY)
Anesthesia: Monitor Anesthesia Care

## 2018-01-20 MED ORDER — SODIUM CHLORIDE 0.9 % IV SOLN
510.0000 mg | Freq: Once | INTRAVENOUS | Status: AC
  Administered 2018-01-20: 510 mg via INTRAVENOUS
  Filled 2018-01-20: qty 17

## 2018-01-20 MED ORDER — PROPOFOL 10 MG/ML IV BOLUS
INTRAVENOUS | Status: DC | PRN
  Administered 2018-01-20: 20 mg via INTRAVENOUS

## 2018-01-20 MED ORDER — SODIUM CHLORIDE 0.9 % IV SOLN: INTRAVENOUS | Status: DC

## 2018-01-20 MED ORDER — CEFTRIAXONE SODIUM 1 G IJ SOLR
1.0000 g | INTRAMUSCULAR | Status: DC
Start: 2018-01-20 — End: 2018-01-21
  Administered 2018-01-20 – 2018-01-21 (×2): 1 g via INTRAVENOUS
  Filled 2018-01-20: qty 10
  Filled 2018-01-20: qty 1

## 2018-01-20 MED ORDER — LIDOCAINE HCL (PF) 2 % IJ SOLN
INTRAMUSCULAR | Status: DC | PRN
  Administered 2018-01-20: 10 mL

## 2018-01-20 MED ORDER — ONDANSETRON HCL 4 MG/2ML IJ SOLN
INTRAMUSCULAR | Status: DC | PRN
  Administered 2018-01-20: 4 mg via INTRAVENOUS

## 2018-01-20 MED ORDER — PROPOFOL 500 MG/50ML IV EMUL
INTRAVENOUS | Status: DC | PRN
  Administered 2018-01-20: 100 ug/kg/min via INTRAVENOUS

## 2018-01-20 MED ORDER — LIDOCAINE HCL (PF) 2 % IJ SOLN
INTRAMUSCULAR | Status: AC
  Filled 2018-01-20: qty 20

## 2018-01-20 MED ORDER — PHENYLEPHRINE HCL 10 MG/ML IJ SOLN
INTRAMUSCULAR | Status: DC | PRN
  Administered 2018-01-20: 120 ug via INTRAVENOUS
  Administered 2018-01-20 (×2): 160 ug via INTRAVENOUS

## 2018-01-20 MED ORDER — LACTATED RINGERS IV SOLN
INTRAVENOUS | Status: DC
  Administered 2018-01-20 (×2): via INTRAVENOUS

## 2018-01-20 NOTE — Transfer of Care (Signed)
Immediate Anesthesia Transfer of Care Note  Patient: [name]  Procedure(s) Performed: ESOPHAGOGASTRODUODENOSCOPY (EGD) (N/A )  Patient Location: Endoscopy Unit  Anesthesia Type:MAC  Level of Consciousness: awake, alert  and oriented  Airway & Oxygen Therapy: Patient Spontanous Breathing and Patient connected to nasal cannula oxygen  Post-op Assessment: Report given to RN, Post -op Vital signs reviewed and stable and Patient moving all extremities  Post vital signs: Reviewed and stable  Last Vitals:  Vitals Value Taken Time  BP 66/17 01/20/2018  1:29 PM  Temp 36.5 C 01/20/2018  1:18 PM  Pulse 71 01/20/2018  1:31 PM  Resp 12 01/20/2018  1:31 PM  SpO2 99 % 01/20/2018  1:31 PM  Vitals shown include unvalidated device data.  Last Pain:  Vitals:   01/20/18 1318  TempSrc: Oral  PainSc: 0-No pain      Patients Stated Pain Goal: 0 (17/91/50 5697)  Complications: No apparent anesthesia complications. Phenylephrine given for hypotension. Fluid bolus

## 2018-01-20 NOTE — Procedures (Signed)
PROCEDURE SUMMARY:  Successful US guided paracentesis from left lateral abdomen.  Yielded 700 mL of yellow fluid.  No immediate complications.  Pt tolerated well.   Specimen was sent for labs.  Docia Barrier PA-C 01/20/2018 4:27 PM

## 2018-01-20 NOTE — Progress Notes (Signed)
PT Cancellation Note  Patient Details Name: [name] MRN: [id number] DOB: 1946/06/26   Cancelled Treatment:    Reason Eval/Treat Not Completed: Medical issues which prohibited therapy.  Hgb is 7.1 and will wait until her labs are managed to be appropriate for mobility.  Ramond Dial 01/20/2018, 8:30 AM   Mee Hives, PT MS Acute Rehab Dept. Number: [place] [facility] [place]

## 2018-01-20 NOTE — Progress Notes (Signed)
Patient states that she does not wear a CPAP at home most of the time and has declined.

## 2018-01-20 NOTE — Consult Note (Signed)
   [facility] Inpatient Consult   01/20/2018  [name] 1946/01/23 [id number]   Patient was assessed for [place] [facility] for community services for high/extreme risk for unplanned readmission. Patient has had 3 admissions in the past 6 months.  Admitted with GI bleed.  Patient was previously active with [facility].  Met with patient and husband  at bedside regarding being restarted with [facility] services. Patient was eating and sitting up on side of bed.  She denies any issues for post hospital care management.  Husband states, "we have a good relationship with her doctors and see them when we need to. Primary Care Provider:  Dr. Unk Pinto.  She got some blood today and she is just glad to be able to eat after a day and a half.  Explained services available and they both politely declined.  They accepted the 24 hour nurse advise line information with contact information.   Of note, [facility] Care Management services does not replace or interfere with any services that are arranged by inpatient case management or social work. For additional questions or referrals please contact:  Natividad Brood, RN BSN [facility] Liaison  [phone] business mobile phone Toll free office [phone]

## 2018-01-20 NOTE — Anesthesia Postprocedure Evaluation (Signed)
Anesthesia Post Note  Patient: [name]  Procedure(s) Performed: ESOPHAGOGASTRODUODENOSCOPY (EGD) (N/A )     Patient location during evaluation: PACU Anesthesia Type: MAC Level of consciousness: awake and alert Pain management: pain level controlled Vital Signs Assessment: post-procedure vital signs reviewed and stable Respiratory status: spontaneous breathing, nonlabored ventilation, respiratory function stable and patient connected to nasal cannula oxygen Cardiovascular status: stable and blood pressure returned to baseline Postop Assessment: no apparent nausea or vomiting Anesthetic complications: no    Last Vitals:  Vitals:   01/20/18 1318 01/20/18 1320  BP: (!) 70/19 (!) 90/26  Pulse: 70 69  Resp: 12 11  Temp: 36.5 C   SpO2: 100% 100%    Last Pain:  Vitals:   01/20/18 1318  TempSrc: Oral  PainSc: 0-No pain                 Montez Hageman

## 2018-01-20 NOTE — Progress Notes (Signed)
PROGRESS NOTE  [name]  FKC:[id number] DOB: Jan 28, 1946 DOA: 01/19/2018 PCP: Unk Pinto, MD  Outpatient Specialists: [facility], [facility] Brief Narrative: [name] is a 72 y.o. female with a history of NASH cirrhosis with ascites, recurrent pleural effusions, history of hepatic encephalopathy and esophageal varices s/p banding 2013, [place] s/p radiofrequency ablation, and severe COPD on 2-3L home O2 who presented to the ED 6/16 at the direction of her PCP for hgb of 7.3 and generalized weakness. She also reported increasing abdominal swelling and discomfort but no vomiting, hematemesis, hematochezia or melena, though stools described as dark due to po iron. On arrival she was hypotensive and requiring 3L O2, negative orthostatics. Hgb 7.1, platelets 110, Cr 1.5, +FOBT. She was given 1u PRBCs, started on PPI, octreotide, and CTX ppx. GI consulted and planning EGD 6/17. Hgb up to 7.8. Paracentesis also requested.  Assessment & Plan: Principal Problem:   Acute blood loss anemia Active Problems:   Symptomatic anemia   Esophageal varices (HCC)   Essential hypertension   Thrombocytopenia (HCC)   COPD GOLD IV/ 02 dep    Obesity (BMI 30-39.9)   Hepatocellular carcinoma ([place])   Obesity hypoventilation syndrome (HCC)   Liver cirrhosis secondary to nonalcoholic steatohepatitis (NASH) (HCC)   Chronic diastolic CHF (congestive heart failure) (HCC)   Macrocytic anemia   CKD (chronic kidney disease), stage III (HCC)   Chronic respiratory failure with hypoxia and hypercapnia (HCC)  Acute blood loss anemia on chronic iron deficiency anemia: No gross blood loss reported, +FOBT. Melena vs. darkening from increased po iron. INR 1.17.  - hgb 7.1 > 7.8 s/p 1u PRBCs. Will monitor serial CBC with thrombocytopenia.  - Macrocytic, though B12 and folate wnl. Iron and % sat low on recent lab work. Will give IV iron.   Suspected GI bleeding: Known esophageal varices and gastritis.  - EGD  6/17 - Keep NPO pending recommendations - Stop aspirin - Protonix IV, octreotide.  [facility] Dx Nov 2018, NASH cirrhosis, history of hepatic encephalopathy: No indication of SBP without fever, leukocytosis, mental status changes, and minimal abdominal tenderness.  - Holding torsemide, spironolactone, and potassium due to hypotension.  - CTX SBP ppx due to GI bleed.  - Dx and Tx paracentesis ordered.  - Continue lactulose and rifaximin. No encephalopathy currently.  Acute on chronic hypoxic respiratory failure due to symptomatic anemia and severe COPD: Up to 3L from home 2LPM. FEV1 was found to be 1L with FEV1 of 0.51. Also suspect element of OHS, OSA but no sleep study documented.  - Continue supplemental oxygen to maintain SpO2 88-95% - Continue home COPD medications - PAP ordered though pt not using at home and declining qHS.  Chronic diastolic CHF: V4BS on echo. EF 60-65% - Monitor I/O, daily weights, restart diuretics as able.   Thrombocytopenia: Chronic, stable.  - Monitor,   HTN: Hypotensive. - Hold antihypertensives  RLS:  - Continue home medications  AKI on stage III CKD: Suspect due to decreased EABV w/anemia.  - Improved modestly, back to suspected baseline w/CrCl ~38.  - Avoid nephrotoxins, holding diuretics.  Headache: No concerning features at this time. Has these chronically intermittently.  - Advised to avoid tylenol - Tramadol home med ordered prn  Abnormal labs: On this morning's CMP including significant acute hypernatremia, severe anion gap and LFT abnormalities.  - Suspect spurious lab based on recheck this AM.  - Monitor CMP tmrw AM.  DVT prophylaxis: SCD Code Status: DNR confirmed with pt and husband  on admission. Husband asked to bring in MOST form from home.  Family Communication: None at bedside this AM Disposition Plan: Uncertain  Consultants:   GI  Procedures:   EGD 01/20/2018: Pending  Antimicrobials:  Ceftriaxone 6/17 >>    Subjective: Having some bifrontal headache which she usually takes tylenol for at home. No bleeding noted. No N/V/D.   Objective: Vitals:   01/20/18 0834 01/20/18 0903 01/20/18 0908 01/20/18 1141  BP: (!) 90/40   (!) 126/32  Pulse:    71  Resp:    17  Temp:    98 F (36.7 C)  TempSrc:    Oral  SpO2:  100% 100% 100%  Weight:    82.7 kg (182 lb 5.1 oz)  Height:    5' 2"  (1.575 m)    Intake/Output Summary (Last 24 hours) at 01/20/2018 1151 Last data filed at 01/20/2018 0935 Gross per 24 hour  Intake 10 ml  Output 400 ml  Net -390 ml   Filed Weights   01/19/18 2014 01/20/18 1141  Weight: 82.7 kg (182 lb 5.1 oz) 82.7 kg (182 lb 5.1 oz)   Gen: 72 y.o. female in no distress Pulm: Non-labored breathing #LPM. Clear to auscultation bilaterally.  CV: Regular rate and rhythm. No murmur, rub, or gallop. No JVD, trace pedal edema. GI: Abdomen distended, only mildly tender without focality +BS. No organomegaly or masses felt. Ext: Warm, no deformities Skin: LE's with chronic phlebitis bilaterally Neuro: Alert and oriented. No focal neurological deficits. Psych: Judgement and insight appear normal. Mood & affect appropriate.   Data Reviewed: I have personally reviewed following labs and imaging studies  CBC: Recent Labs  Lab 01/15/18 1733 01/16/18 1519 01/19/18 1609 01/20/18 0018 01/20/18 0800  WBC 5.9 5.2 6.2 4.6 4.4  NEUTROABS 4,101 3,739  --   --   --   HGB 7.8* 7.3* 7.1* 7.1* 7.8*  HCT 23.5* 24.0* 23.9* 23.7* 25.7*  MCV 96.3 104.3* 107.7* 105.3* 102.8*  PLT 122* 110* 110* 86* 94*   Basic Metabolic Panel: Recent Labs  Lab 01/15/18 1733 01/19/18 1609 01/20/18 0018 01/20/18 0813  NA 143 135 151* 138  K 3.0* 4.3 3.5 3.9  CL 98 99* 86* 103  CO2 36* 28 22 28   GLUCOSE 104* 117* 77 79  BUN 53* 36* 30* 30*  CREATININE 1.29* 1.50* 1.28* 1.34*  CALCIUM 9.0 8.4* 6.5* 8.0*   GFR: Estimated Creatinine Clearance: 38.4 mL/min (A) (by C-G formula based on SCr of 1.34 mg/dL  (H)). Liver Function Tests: Recent Labs  Lab 01/15/18 1733 01/19/18 1609 01/20/18 0018  AST 21 29 30   ALT 15 20 16   ALKPHOS  --  106 78  BILITOT 1.2 1.2 2.5*  PROT 4.6* 4.9* 4.0*  ALBUMIN  --  2.5* 2.1*   No results for input(s): LIPASE, AMYLASE in the last 168 hours. Recent Labs  Lab 01/15/18 1733  AMMONIA 54   Coagulation Profile: Recent Labs  Lab 01/15/18 1733 01/20/18 0009  INR 1.1 1.17   Cardiac Enzymes: No results for input(s): CKTOTAL, CKMB, CKMBINDEX, TROPONINI in the last 168 hours. BNP (last 3 results) No results for input(s): PROBNP in the last 8760 hours. HbA1C: No results for input(s): HGBA1C in the last 72 hours. CBG: No results for input(s): GLUCAP in the last 168 hours. Lipid Profile: No results for input(s): CHOL, HDL, LDLCALC, TRIG, CHOLHDL, LDLDIRECT in the last 72 hours. Thyroid Function Tests: No results for input(s): TSH, T4TOTAL, FREET4, T3FREE, THYROIDAB in the last  72 hours. Anemia Panel: No results for input(s): VITAMINB12, FOLATE, FERRITIN, TIBC, IRON, RETICCTPCT in the last 72 hours. Urine analysis:    Component Value Date/Time   COLORURINE DARK YELLOW 01/16/2018 1606   APPEARANCEUR CLOUDY (A) 01/16/2018 1606   LABSPEC 1.011 01/16/2018 1606   PHURINE 7.0 01/16/2018 1606   GLUCOSEU NEGATIVE 01/16/2018 1606   HGBUR NEGATIVE 01/16/2018 1606   BILIRUBINUR NEGATIVE 09/16/2017 [place] NEGATIVE 01/16/2018 1606   PROTEINUR NEGATIVE 01/16/2018 1606   UROBILINOGEN 1 01/13/2015 1145   NITRITE NEGATIVE 09/16/2017 1922   LEUKOCYTESUR TRACE (A) 09/16/2017 1922   Recent Results (from the past 240 hour(s))  Urine Culture     Status: None   Collection Time: 01/16/18  4:06 PM  Result Value Ref Range Status   MICRO NUMBER: [id number]  Final   SPECIMEN QUALITY: ADEQUATE  Final   Sample Source URINE  Final   STATUS: FINAL  Final   Result:   Final    Three or more organisms present, each greater than 10,000 cu/mL. May represent normal  flora contamination from external genitalia. No further testing is required.      Radiology Studies: No results found.  Scheduled Meds: . [MAR Hold] allopurinol  300 mg Oral Daily  . [MAR Hold] arformoterol  15 mcg Nebulization BID  . [MAR Hold] citalopram  20 mg Oral Q supper  . [MAR Hold] gabapentin  600 mg Oral BID  . [MAR Hold] lactulose  20 g Oral Daily  . [MAR Hold] magnesium oxide  200 mg Oral QHS  . [MAR Hold] pantoprazole (PROTONIX) IV  40 mg Intravenous Q12H  . [MAR Hold] rifampin  300 mg Oral BID  . [MAR Hold] sodium chloride flush  3 mL Intravenous Q12H  . [MAR Hold] umeclidinium bromide  1 puff Inhalation Daily   Continuous Infusions: . [MAR Hold] cefTRIAXone (ROCEPHIN)  IV Stopped (01/20/18 1059)  . lactated ringers 20 mL/hr at 01/20/18 1147     LOS: 1 day   Time spent: 35 minutes.  Patrecia Pour, MD [facility] [email or web address] Password [facility] 01/20/2018, 11:51 AM

## 2018-01-20 NOTE — Progress Notes (Signed)
Pt's BP is 90/40 manually. MD Bonner Puna made aware. Will continue to assess.

## 2018-01-20 NOTE — Interval H&P Note (Signed)
History and Physical Interval Note:  01/20/2018 12:36 PM  [name]  has presented today for surgery, with the diagnosis of Anemia and history of esophageal varices  The various methods of treatment have been discussed with the patient and family. After consideration of risks, benefits and other options for treatment, the patient has consented to  Procedure(s): ESOPHAGOGASTRODUODENOSCOPY (EGD) (N/A) as a surgical intervention .  The patient's history has been reviewed, patient examined, no change in status, stable for surgery.  I have reviewed the patient's chart and labs.  Questions were answered to the patient's satisfaction.     Pricilla Riffle. Fuller Plan

## 2018-01-20 NOTE — Anesthesia Preprocedure Evaluation (Signed)
Anesthesia Evaluation  Patient identified by MRN, date of birth, ID band Patient awake    Reviewed: Allergy & Precautions, NPO status , Patient's Chart, lab work & pertinent test results, reviewed documented beta blocker date and time   Airway Mallampati: I       Dental no notable dental hx. (+) Teeth Intact   Pulmonary COPD, former smoker,    Pulmonary exam normal breath sounds clear to auscultation       Cardiovascular hypertension, Pt. on home beta blockers Normal cardiovascular exam Rhythm:Regular Rate:Normal     Neuro/Psych    GI/Hepatic GERD  Medicated,(+) Cirrhosis   Esophageal Varices    ,   Endo/Other  diabetes, Type 2  Renal/GU Renal InsufficiencyRenal disease     Musculoskeletal   Abdominal (+) + obese,   Peds  Hematology  (+) Blood dyscrasia, anemia ,   Anesthesia Other Findings   Reproductive/Obstetrics                             Anesthesia Physical  Anesthesia Plan  ASA: III  Anesthesia Plan: MAC   Post-op Pain Management:    Induction: Intravenous  PONV Risk Score and Plan: 4 or greater and Ondansetron and Treatment may vary due to age or medical condition  Airway Management Planned: Nasal Cannula  Additional Equipment:   Intra-op Plan:   Post-operative Plan:   Informed Consent: I have reviewed the patients History and Physical, chart, labs and discussed the procedure including the risks, benefits and alternatives for the proposed anesthesia with the patient or authorized representative who has indicated his/her understanding and acceptance.   Dental advisory given  Plan Discussed with: CRNA and Surgeon  Anesthesia Plan Comments:         Anesthesia Quick Evaluation

## 2018-01-20 NOTE — Op Note (Signed)
[facility] Patient Name: [name] Procedure Date : 01/20/2018 MRN: [id number] Attending MD: Ladene Artist , MD Date of Birth: 10-18-1945 CSN: [id number] Age: 72 Admit Type: Inpatient Procedure:                Upper GI endoscopy Indications:              Iron deficiency anemia, Heme positive stool,                            Cirrhosis Providers:                Pricilla Riffle. Fuller Plan, MD, Burtis Junes, RN, Elspeth Cho                            Tech., Technician, Trixie Deis, CRNA Referring MD:             [facility] Medicines:                Monitored Anesthesia Care Complications:            No immediate complications. Estimated Blood Loss:     Estimated blood loss: none. Procedure:                Pre-Anesthesia Assessment:                           - Prior to the procedure, a History and Physical                            was performed, and patient medications and                            allergies were reviewed. The patient's tolerance of                            previous anesthesia was also reviewed. The risks                            and benefits of the procedure and the sedation                            options and risks were discussed with the patient.                            All questions were answered, and informed consent                            was obtained. Prior Anticoagulants: The patient has                            taken no previous anticoagulant or antiplatelet                            agents. ASA Grade Assessment: III - A patient with  severe systemic disease. After reviewing the risks                            and benefits, the patient was deemed in                            satisfactory condition to undergo the procedure.                           After obtaining informed consent, the endoscope was                            passed under direct vision. Throughout the   procedure, the patient's blood pressure, pulse, and                            oxygen saturations were monitored continuously. The                            Endoscope was introduced through the mouth, and                            advanced to the. The Endoscope was introduced                            through the mouth, and advanced to the second part                            of duodenum. The upper GI endoscopy was                            accomplished without difficulty. The patient                            tolerated the procedure well. Scope In: Scope Out: Findings:      Two columns of non-bleeding grade I varices were found in the distal       esophagus,. They were 3 mm in largest diameter. No stigmata of recent       bleeding were evident and no red wale signs were present. The varices       appeared unchanged in size from prior exam.      The exam of the esophagus was otherwise normal.      Moderate portal hypertensive gastropathy was found in the entire       examined stomach.      The exam of the stomach was otherwise normal.      The duodenal bulb and second portion of the duodenum were normal. Impression:               - Non-bleeding grade I esophageal varices.                           - Portal hypertensive gastropathy.                           - Normal duodenal bulb and  second portion of the                            duodenum.                           - No specimens collected. Recommendation:           - Anticipate chronic blood loss from portal                            gastropathy leading to recurrent iron deficiency.                           - Return patient to hospital ward for ongoing care.                           - Resume previous diet.                           - Continue present medications including Bisoprolol.                           - Increase Fe to bid with meals long term                           - IV Fe infusion in hospital                            - GI office follow up with Dr.  Cellar in                            1 month.                           - GI signing off. Procedure Code(s):        --- Professional ---                           [phone], Esophagogastroduodenoscopy, flexible,                            transoral; diagnostic, including collection of                            specimen(s) by brushing or washing, when performed                            (separate procedure) Diagnosis Code(s):        --- Professional ---                           I85.00, Esophageal varices without bleeding                           K76.6, Portal hypertension  K31.89, Other diseases of stomach and duodenum                           D50.0, Iron deficiency anemia secondary to blood                            loss (chronic)                           R19.5, Other fecal abnormalities CPT copyright 2017 American Medical Association. All rights reserved. The codes documented in this report are preliminary and upon coder review may  be revised to meet current compliance requirements. Ladene Artist, MD 01/20/2018 1:28:15 PM This report has been signed electronically. Number of Addenda: 0

## 2018-01-20 NOTE — Anesthesia Procedure Notes (Signed)
Procedure Name: MAC Date/Time: 01/20/2018 12:46 PM Performed by: Leonor Liv, CRNA Pre-anesthesia Checklist: Patient identified, Emergency Drugs available, Suction available, Patient being monitored and Timeout performed Patient Re-evaluated:Patient Re-evaluated prior to induction Oxygen Delivery Method: Nasal cannula Placement Confirmation: positive ETCO2

## 2018-01-21 ENCOUNTER — Encounter: Payer: Self-pay | Admitting: Physician Assistant

## 2018-01-21 DIAGNOSIS — R195 Other fecal abnormalities: Secondary | ICD-10-CM

## 2018-01-21 DIAGNOSIS — D500 Iron deficiency anemia secondary to blood loss (chronic): Secondary | ICD-10-CM

## 2018-01-21 LAB — COMPREHENSIVE METABOLIC PANEL
ALBUMIN: 2.1 g/dL — AB (ref 3.5–5.0)
ALK PHOS: 102 U/L (ref 38–126)
ALT: 17 U/L (ref 14–54)
AST: 22 U/L (ref 15–41)
Anion gap: 9 (ref 5–15)
BILIRUBIN TOTAL: 1.4 mg/dL — AB (ref 0.3–1.2)
BUN: 26 mg/dL — AB (ref 6–20)
CALCIUM: 7.6 mg/dL — AB (ref 8.9–10.3)
CO2: 28 mmol/L (ref 22–32)
Chloride: 101 mmol/L (ref 101–111)
Creatinine, Ser: 1.37 mg/dL — ABNORMAL HIGH (ref 0.44–1.00)
GFR calc Af Amer: 44 mL/min — ABNORMAL LOW (ref >60)
GFR calc non Af Amer: 38 mL/min — ABNORMAL LOW (ref >60)
GLUCOSE: 112 mg/dL — AB (ref 65–99)
Potassium: 3.8 mmol/L (ref 3.5–5.1)
Sodium: 138 mmol/L (ref 135–145)
TOTAL PROTEIN: 4.3 g/dL — AB (ref 6.5–8.1)

## 2018-01-21 LAB — CBC
HCT: 25 % — ABNORMAL LOW (ref 36.0–46.0)
Hemoglobin: 7.5 g/dL — ABNORMAL LOW (ref 12.0–15.0)
MCH: 31.4 pg (ref 26.0–34.0)
MCHC: 30 g/dL (ref 30.0–36.0)
MCV: 104.6 fL — ABNORMAL HIGH (ref 78.0–100.0)
Platelets: 92 10*3/uL — ABNORMAL LOW (ref 150–400)
RBC: 2.39 MIL/uL — ABNORMAL LOW (ref 3.87–5.11)
RDW: 18.8 % — AB (ref 11.5–15.5)
WBC: 3.8 10*3/uL — ABNORMAL LOW (ref 4.0–10.5)

## 2018-01-21 NOTE — Care Management Note (Signed)
Case Management Note  Patient Details  Name: [name] MRN: [id number] Date of Birth: 15-Dec-1945  Subjective/Objective:  Pt admitted on 6/16at the direction of her PCP for hgb of 7.3and generalized weakness.  PTA, pt resided at home with spouse; she is O2 dependent with oxygen provided by [facility].  She has hx of [place] services with [facility].                    Action/Plan: PT recommending HH follow up at dc.  Pt medically stable for dc home today, and spouse able to provide assistance at home.  Referral to [facility] for [facility] follow up, per pt choice.  No DME needs, per pt/spouse.  Pt has portable O2 tank for transport home.    Expected Discharge Date:  01/21/18               Expected Discharge Plan:  [place]  In-House Referral:     Discharge planning Services  CM Consult  Post Acute Care Choice:  Home Health Choice offered to:  Patient  DME Arranged:    DME Agency:     HH Arranged:  PT [place]:  [place]  Status of Service:  Completed, signed off  If discussed at [place] of Stay Meetings, dates discussed:    Additional Comments:  Ella Bodo, RN 01/21/2018, 12:25 PM

## 2018-01-21 NOTE — Discharge Summary (Signed)
Physician Discharge Summary  [name]:[id number] DOB: 01-22-1946 DOA: 01/19/2018  PCP: Unk Pinto, MD  Admit date: 01/19/2018 Discharge date: 01/21/2018  Admitted From: Home Disposition: Home   Recommendations for Outpatient Follow-up:  1. Follow up with PCP in 1-2 weeks with repeat CBC, CMP 2. Follow up with [facility], Dr. Havery Moros, in the next 1 month. Increasing po iron for chronic GI blood loss. Consider discontinuing aspirin.  Home Health: None new Equipment/Devices: None new Discharge Condition: Stable CODE STATUS: Partial (all measures except intubation/mechanical ventilation) Diet recommendation: Heart healthy  Brief/Interim Summary: [name] a 72 y.o.femalewith a historyof NASH cirrhosis with ascites, recurrent pleural effusions, history of hepatic encephalopathy and esophageal varices s/p banding 2013, [place] s/p radiofrequency ablation, and severe COPD on 2-3L home O2 who presented to the ED 6/16 at the direction of her PCP for hgb of 7.3 and generalized weakness. She also reported increasing abdominal swelling and discomfort but no vomiting, hematemesis, hematochezia or melena, though stools described as dark due to po iron. On arrival she was hypotensive and requiring 3L O2, negative orthostatics. Hgb 7.1, platelets 110, Cr 1.5, +FOBT. She was given 1u PRBCs, started on PPI, octreotide, and CTX ppx. GI consulted and performed EGD 6/17 which showed nonbleeding esophageal varices and portal hypertensive gastropathy. Hgb up to 7.8 and has remained stable at 7.5g/dl with no evidence fo bleeding. IV iron provided while inpatient and oral iron supplement increased to TID. Paracentesis was performed yielding 700cc with SAAG >1.1 as expected, with symptomatic improvement. She remains with soft blood pressures which is her baseline on review of old records. She will need repeat labs in the next week and consideration of outpatient transfusions and/or IV iron.    Discharge Diagnoses:  Principal Problem:   Acute blood loss anemia Active Problems:   Symptomatic anemia   Esophageal varices (HCC)   Essential hypertension   Thrombocytopenia (HCC)   COPD GOLD IV/ 02 dep    Obesity (BMI 30-39.9)   Hepatocellular carcinoma ([place])   Obesity hypoventilation syndrome (HCC)   Liver cirrhosis secondary to nonalcoholic steatohepatitis (NASH) (HCC)   Chronic diastolic CHF (congestive heart failure) (HCC)   Macrocytic anemia   CKD (chronic kidney disease), stage III (HCC)   Chronic respiratory failure with hypoxia and hypercapnia (HCC)   Iron deficiency anemia due to chronic blood loss   Occult blood in stools  Acute on chronic blood loss anemia and iron deficiency anemia: No gross blood loss reported, +FOBT. Melena vs. darkening from increased po iron. INR 1.17.  - hgb 7.1 > 7.8 > 7.5 s/p 1u PRBCs.  - Macrocytic, though B12 and folate wnl. Iron and % sat low on recent lab work. Gave IV feraheme x1, consider repeat. Increased po iron to TID (has been tolerating this without constipation)  Portal hypertensive gastropathy, grade I nonbleeding esophageal varices:  - Continue beta blocker, PPI, return precautions advised Re: gross bleeding.   [facility] Dx Nov 2018,NASHcirrhosis,history of hepatic encephalopathy: No indication ofSBPwithout fever, leukocytosis, mental status changes, and minimal abdominal tenderness.  -Ok to restart home medications.  - CTX SBP ppx due to GI bleed, no evidence of infection on para specimen.  - Dx and Tx paracentesis 700cc out 01/20/2018 - Continue lactulose and rifaximin.No encephalopathy currently.  Acute on chronic hypoxic respiratory failure due to symptomatic anemia and severe COPD: Up to 3L from home 2LPM. FEV1 was found to be 1L with FEV1 of 0.51.Also suspect element of OHS, OSA but no sleep study  documented.  - Continue supplemental oxygen to maintain SpO2 88-95% - Continue home COPD medications  Chronic  diastolic CHF: B4WH on echo. EF 60-65% - Monitor I/O, daily weights, restart diuretics  Thrombocytopenia: Chronic, stable.  - Monitor   HTN: Hypotensive. - Hold antihypertensives  RLS:  - Continue home medications  AKI on stage III CKD: Suspect due to decreased EABV w/anemia.  - Improved modestly, back to suspected baseline w/CrCl ~38.  - Avoid nephrotoxins  Headache: No concerning features at this time. Has these chronically intermittently.  - Advised to avoid tylenol - Tramadol home med  Abnormal labs: Spurious CMP on 6/16 corrected with repeat BMP.  Discharge Instructions Discharge Instructions    Diet - low sodium heart healthy   Complete by:  As directed    Discharge instructions   Complete by:  As directed    Follow up with your PCP in the next week for repeat labs.  - Continue taking iron three times daily with vitamin C - Continue all other medications as you were Call to schedule follow up with Dr. Havery Moros in the next month  If you notice any bleeding in your stools or any vomiting or worsening abdominal pain or fever you must seek medical attention right away.   Increase activity slowly   Complete by:  As directed      Allergies as of 01/21/2018      Reactions   Atorvastatin Other (See Comments)   Unknown reaction   Diphenhydramine Hcl (sleep) Hives   Hydrocodone-acetaminophen Other (See Comments)   Unknown reaction   Lopid [gemfibrozil] Other (See Comments)   Unknown reaction   Loratadine Hives   Lorazepam Hives   Simvastatin Other (See Comments)   Unknown reaction   Sulfamethoxazole Hives   Sulfonamide Derivatives Hives      Medication List    TAKE these medications   albuterol 108 (90 Base) MCG/ACT inhaler Commonly known as:  PROVENTIL HFA;VENTOLIN HFA Inhale 2 puffs into the lungs every 6 (six) hours as needed for wheezing or shortness of breath.   allopurinol 300 MG tablet Commonly known as:  ZYLOPRIM take 1 tablet (300 mg) by  mouth daily with supper - for gout   ALPRAZolam 0.25 MG tablet Commonly known as:  XANAX Take 1 tab as needed for anxiety, cramping, air hunger every 8 hours as needed.   aspirin EC 81 MG tablet Take 81 mg by mouth at bedtime.   bisoprolol 10 MG tablet Commonly known as:  ZEBETA Take 1 tablet (10 mg total) by mouth daily.   citalopram 20 MG tablet Commonly known as:  CELEXA Take 1 tablet (20 mg total) by mouth daily with supper. What changed:  when to take this   cyclobenzaprine 10 MG tablet Commonly known as:  FLEXERIL Take 1 tablet (10 mg total) by mouth 3 (three) times daily as needed for muscle spasms.   ferrous sulfate 325 (65 FE) MG tablet Take 325 mg by mouth 3 (three) times daily with meals.   gabapentin 600 MG tablet Commonly known as:  NEURONTIN Take 600 mg by mouth 2 (two) times daily.   Glycopyrrolate-Formoterol 9-4.8 MCG/ACT Aero Commonly known as:  BEVESPI AEROSPHERE Inhale 2 puffs into the lungs 2 (two) times daily.   lactulose 10 GM/15ML solution Commonly known as:  CHRONULAC take 9ms TWICE DAILY What changed:    how much to take  how to take this  when to take this  additional instructions   Magnesium 250 MG Tabs  Take 250 mg by mouth at bedtime.   ondansetron 4 MG tablet Commonly known as:  ZOFRAN Take 1 tablet (4 mg total) by mouth every 6 (six) hours as needed for nausea or vomiting.   OXYGEN Inhale 2-3 L into the lungs continuous. 3 L with exertion   potassium chloride 10 MEQ tablet Commonly known as:  K-DUR Take 2 tablet 3 times a day. What changed:    how much to take  how to take this  when to take this  additional instructions   rifampin 300 MG capsule Commonly known as:  RIFADIN Take 300 mg by mouth 2 (two) times daily.   spironolactone 25 MG tablet Commonly known as:  ALDACTONE Take 25 mg by mouth 2 (two) times daily.   torsemide 20 MG tablet Commonly known as:  DEMADEX Take 3 tablets 2 x/ day  Morning & Nite  for fluid Retention & Swelling What changed:    how much to take  how to take this  when to take this  additional instructions   traMADol 50 MG tablet Commonly known as:  ULTRAM Take 50 mg by mouth every 12 hours as needed for pain for chronic pain   Vitamin D3 5000 units Caps Take 5,000 Units by mouth daily with breakfast.      Follow-up Information    Armbruster, Carlota Raspberry, MD. Schedule an appointment as soon as possible for a visit in 1 month(s).   Specialty:  Gastroenterology Contact information: [place] [address] [phone]        Unk Pinto, MD. Schedule an appointment as soon as possible for a visit in 1 week(s).   Specialty:  Internal Medicine         Allergies  Allergen Reactions  . Atorvastatin Other (See Comments)    Unknown reaction  . Diphenhydramine Hcl (Sleep) Hives  . Hydrocodone-Acetaminophen Other (See Comments)    Unknown reaction  . Lopid [Gemfibrozil] Other (See Comments)    Unknown reaction  . Loratadine Hives  . Lorazepam Hives  . Simvastatin Other (See Comments)    Unknown reaction  . Sulfamethoxazole Hives  . Sulfonamide Derivatives Hives    Consultations:  GI  Procedures/Studies: Dg Chest 2 View  Result Date: 12/26/2017 CLINICAL DATA:  Dyspnea for 4 weeks EXAM: CHEST - 2 VIEW COMPARISON:  09/27/2017 FINDINGS: Mild cardiomegaly. Mild vascular congestion. Interstitial prominence is present. Minimal Kerley B-lines at the left base. No definite consolidation. Tiny pleural effusions are characterized by blunting of the posterior costophrenic angles. IMPRESSION: Mild CHF.  Mild interstitial edema. Electronically Signed   By: Marybelle Killings M.D.   On: 12/26/2017 08:57   Ir Paracentesis  Result Date: 01/20/2018 INDICATION: Patient with recurrent ascites. Request is made for diagnostic and therapeutic paracentesis. EXAM: ULTRASOUND GUIDED DIAGNOSTIC AND THERAPEUTIC PARACENTESIS MEDICATIONS: 10 mL 2%  lidocaine COMPLICATIONS: None immediate. PROCEDURE: Informed written consent was obtained from the patient after a discussion of the risks, benefits and alternatives to treatment. A timeout was performed prior to the initiation of the procedure. Initial ultrasound scanning demonstrates a small amount of ascites within the right lower abdominal quadrant. The right lower abdomen was prepped and draped in the usual sterile fashion. 2% lidocaine was used for local anesthesia. Following this, a 19 gauge, 7-cm, Yueh catheter was introduced. An ultrasound image was saved for documentation purposes. The paracentesis was performed. The catheter was removed and a dressing was applied. The patient tolerated the procedure well without immediate  post procedural complication. FINDINGS: A total of approximately 700 mL of yellow fluid was removed. Samples were sent to the laboratory as requested by the clinical team. IMPRESSION: Successful ultrasound-guided diagnostic and therapeutic paracentesis yielding 700 mLof peritoneal fluid. Read by: Brynda Greathouse PA-C Electronically Signed   By: Marybelle Killings M.D.   On: 01/20/2018 16:29    Dr. Fuller Plan EGD 01/20/2018: Findings:      Two columns of non-bleeding grade I varices were found in the distal       esophagus,. They were 3 mm in largest diameter. No stigmata of recent       bleeding were evident and no red wale signs were present. The varices       appeared unchanged in size from prior exam.      The exam of the esophagus was otherwise normal.      Moderate portal hypertensive gastropathy was found in the entire       examined stomach.      The exam of the stomach was otherwise normal.      The duodenal bulb and second portion of the duodenum were normal. Impression:               - Non-bleeding grade I esophageal varices.                           - Portal hypertensive gastropathy.                           - Normal duodenal bulb and second portion of the                             duodenum.                           - No specimens collected. Recommendation:           - Anticipate chronic blood loss from portal                            gastropathy leading to recurrent iron deficiency.                           - Return patient to hospital ward for ongoing care.                           - Resume previous diet.                           - Continue present medications including Bisoprolol.                           - Increase Fe to bid with meals long term                           - IV Fe infusion in hospital                           - GI office follow up with Dr. Kountze Cellar in  1 month.                           - GI signing off.    Subjective: Tired but reports weakness has improved. No bleeding. Abdominal pain is improved significantly following paracentesis yesterday. Eating well. No V/D/constipation. No problems with IV iron. Wants to go home.  Discharge Exam: Vitals:   01/21/18 0837 01/21/18 0840  BP:    Pulse:    Resp:    Temp:    SpO2: 100% 100%   General: Pt is alert, awake, not in acute distress Cardiovascular: RRR, S1/S2 +, no rubs, no gallops Respiratory: CTA bilaterally, no wheezing, no rhonchi Abdominal: Soft, +BS, decreased protuberance, nontender Extremities: Trace LE edema, no cyanosis  Labs: BNP (last 3 results) Recent Labs    09/08/17 1107 09/16/17 1253  BNP 144.3* 974.1*   Basic Metabolic Panel: Recent Labs  Lab 01/15/18 1733 01/19/18 1609 01/20/18 0018 01/20/18 0813 01/21/18 0647  NA 143 135 151* 138 138  K 3.0* 4.3 3.5 3.9 3.8  CL 98 99* 86* 103 101  CO2 36* 28 22 28 28   GLUCOSE 104* 117* 77 79 112*  BUN 53* 36* 30* 30* 26*  CREATININE 1.29* 1.50* 1.28* 1.34* 1.37*  CALCIUM 9.0 8.4* 6.5* 8.0* 7.6*   Liver Function Tests: Recent Labs  Lab 01/15/18 1733 01/19/18 1609 01/20/18 0018 01/21/18 0647  AST 21 29 30 22   ALT 15 20 16 17   ALKPHOS  --  106 78 102  BILITOT  1.2 1.2 2.5* 1.4*  PROT 4.6* 4.9* 4.0* 4.3*  ALBUMIN  --  2.5* 2.1* 2.1*   No results for input(s): LIPASE, AMYLASE in the last 168 hours. Recent Labs  Lab 01/15/18 1733  AMMONIA 54   CBC: Recent Labs  Lab 01/15/18 1733 01/16/18 1519 01/19/18 1609 01/20/18 0018 01/20/18 0800 01/21/18 0647  WBC 5.9 5.2 6.2 4.6 4.4 3.8*  NEUTROABS 4,101 3,739  --   --   --   --   HGB 7.8* 7.3* 7.1* 7.1* 7.8* 7.5*  HCT 23.5* 24.0* 23.9* 23.7* 25.7* 25.0*  MCV 96.3 104.3* 107.7* 105.3* 102.8* 104.6*  PLT 122* 110* 110* 86* 94* 92*   Cardiac Enzymes: No results for input(s): CKTOTAL, CKMB, CKMBINDEX, TROPONINI in the last 168 hours. BNP: Invalid input(s): POCBNP CBG: No results for input(s): GLUCAP in the last 168 hours. D-Dimer No results for input(s): DDIMER in the last 72 hours. Hgb A1c No results for input(s): HGBA1C in the last 72 hours. Lipid Profile No results for input(s): CHOL, HDL, LDLCALC, TRIG, CHOLHDL, LDLDIRECT in the last 72 hours. Thyroid function studies No results for input(s): TSH, T4TOTAL, T3FREE, THYROIDAB in the last 72 hours.  Invalid input(s): FREET3 Anemia work up No results for input(s): VITAMINB12, FOLATE, FERRITIN, TIBC, IRON, RETICCTPCT in the last 72 hours. Urinalysis    Component Value Date/Time   COLORURINE DARK YELLOW 01/16/2018 1606   APPEARANCEUR CLOUDY (A) 01/16/2018 1606   LABSPEC 1.011 01/16/2018 1606   PHURINE 7.0 01/16/2018 1606   GLUCOSEU NEGATIVE 01/16/2018 1606   HGBUR NEGATIVE 01/16/2018 1606   BILIRUBINUR NEGATIVE 09/16/2017 [place] NEGATIVE 01/16/2018 1606   PROTEINUR NEGATIVE 01/16/2018 1606   UROBILINOGEN 1 01/13/2015 1145   NITRITE NEGATIVE 09/16/2017 1922   LEUKOCYTESUR TRACE (A) 09/16/2017 1922    Microbiology Recent Results (from the past 240 hour(s))  Urine Culture     Status: None   Collection Time: 01/16/18  4:06 PM  Result Value Ref Range Status   MICRO NUMBER: [id number]  Final   SPECIMEN QUALITY: ADEQUATE   Final   Sample Source URINE  Final   STATUS: FINAL  Final   Result:   Final    Three or more organisms present, each greater than 10,000 cu/mL. May represent normal flora contamination from external genitalia. No further testing is required.  MRSA PCR Screening     Status: None   Collection Time: 01/20/18  8:47 AM  Result Value Ref Range Status   MRSA by PCR NEGATIVE NEGATIVE Final    Comment:        The GeneXpert MRSA Assay (FDA approved for NASAL specimens only), is one component of a comprehensive MRSA colonization surveillance program. It is not intended to diagnose MRSA infection nor to guide or monitor treatment for MRSA infections. Performed at [facility], [place] [address], [place], [place] [address]   Gram stain     Status: None   Collection Time: 01/20/18  4:06 PM  Result Value Ref Range Status   Specimen Description FLUID PERITONEAL CAVITY  Final   Special Requests NONE  Final   Gram Stain   Final    MODERATE WBC PRESENT,BOTH PMN AND MONONUCLEAR NO ORGANISMS SEEN Performed at [facility], [address], [place], [place] [address]    Report Status 01/20/2018 FINAL  Final    Time coordinating discharge: Approximately 40 minutes  Patrecia Pour, MD  [facility] 01/21/2018, 10:01 AM Pager [phone]

## 2018-01-21 NOTE — Evaluation (Signed)
Physical Therapy Evaluation Patient Details Name: [name] MRN: [id number] DOB: 09/22/1945 Today's Date: 01/21/2018   History of Present Illness  [name] is a 72 y.o. female with a history of NASH cirrhosis with ascites, recurrent pleural effusions, history of hepatic encephalopathy and esophageal varices s/p banding 2013, [place] s/p radiofrequency ablation, and severe COPD on 2-3L home O2 who presented to the ED 6/16 at the direction of her PCP for hgb of 7.3 and generalized weakness.  REcieved blood transfusion, and had paracentesis  Clinical Impression   Pt admitted with above diagnosis. Pt currently with functional limitations due to the deficits listed below (see PT Problem List). Presents with decr functional mobility and decr functional endurance; Pt will benefit from skilled PT to increase their independence and safety with mobility to allow discharge to the venue listed below.       Follow Up Recommendations Home health PT;Supervision/Assistance - 24 hour    Equipment Recommendations  None recommended by PT(well-equipped)    Recommendations for Other Services       Precautions / Restrictions Precautions Precautions: Fall Precaution Comments: O2 dependent Restrictions Weight Bearing Restrictions: No      Mobility  Bed Mobility Overal bed mobility: Needs Assistance Bed Mobility: Supine to Sit     Supine to sit: Min guard     General bed mobility comments: Heavy use of bedrail and noted inefficient movement patterns  Transfers Overall transfer level: Needs assistance Equipment used: Rolling walker (2 wheeled) Transfers: Sit to/from Stand Sit to Stand: Min guard         General transfer comment: Cues for hand placement and safety; minguard due to slight unsteadiness initially  Ambulation/Gait Ambulation/Gait assistance: Min guard Gait Distance (Feet): 20 Feet Assistive device: Rolling walker (2 wheeled) Gait Pattern/deviations: Step-through  pattern;Decreased step length - right;Decreased step length - left;Decreased stride length;Trunk flexed Gait velocity: slowed   General Gait Details: Cues to self-monitor for activity tolerance; Noted slight L knee buckle and heavy dependence on UE support on RW  Stairs            Wheelchair Mobility    Modified Rankin (Stroke Patients Only)       Balance Overall balance assessment: Mild deficits observed, not formally tested                                           Pertinent Vitals/Pain Pain Assessment: No/denies pain    Home Living Family/patient expects to be discharged to:: Private residence Living Arrangements: Spouse/significant other Available Help at Discharge: Family;Available 24 hours/day Type of Home: House Home Access: Stairs to enter Entrance Stairs-Rails: None Entrance Stairs-Number of Steps: 1 Home Layout: One level Home Equipment: Walker - 2 wheels;Bedside commode;Shower seat;Transport chair      Prior Function Level of Independence: Needs assistance   Gait / Transfers Assistance Needed: reports she uses RW at home, but will sometimes use WC as well   ADL's / Homemaking Assistance Needed: husband helps with some ADLs including showering, IADLs  Comments: RW for household distances     Hand Dominance   Dominant Hand: Right    Extremity/Trunk Assessment   Upper Extremity Assessment Upper Extremity Assessment: Generalized weakness    Lower Extremity Assessment Lower Extremity Assessment: Generalized weakness       Communication   Communication: No difficulties  Cognition Arousal/Alertness: Awake/alert Behavior During Therapy: WFL for  tasks assessed/performed Overall Cognitive Status: Within Functional Limits for tasks assessed                                        General Comments      Exercises     Assessment/Plan    PT Assessment Patient needs continued PT services  PT Problem List  Decreased strength;Decreased activity tolerance;Decreased balance;Decreased mobility;Decreased knowledge of use of DME;Cardiopulmonary status limiting activity       PT Treatment Interventions DME instruction;Gait training;Stair training;Functional mobility training;Therapeutic activities;Therapeutic exercise;Patient/family education    PT Goals (Current goals can be found in the Care Plan section)  Acute Rehab PT Goals Patient Stated Goal: Hopes to be home soon PT Goal Formulation: With patient Time For Goal Achievement: 02/04/18 Potential to Achieve Goals: Good    Frequency Min 3X/week   Barriers to discharge        Co-evaluation               AM-PAC PT "6 Clicks" Daily Activity  Outcome Measure Difficulty turning over in bed (including adjusting bedclothes, sheets and blankets)?: A Little Difficulty moving from lying on back to sitting on the side of the bed? : A Lot Difficulty sitting down on and standing up from a chair with arms (e.g., wheelchair, bedside commode, etc,.)?: A Lot Help needed moving to and from a bed to chair (including a wheelchair)?: A Little Help needed walking in hospital room?: A Little Help needed climbing 3-5 steps with a railing? : A Lot 6 Click Score: 15    End of Session Equipment Utilized During Treatment: Gait belt;Oxygen Activity Tolerance: Patient limited by fatigue Patient left: in chair;with family/visitor present Nurse Communication: Mobility status PT Visit Diagnosis: Unsteadiness on feet (R26.81);Other abnormalities of gait and mobility (R26.89);Muscle weakness (generalized) (M62.81)    Time: 6213-0865 PT Time Calculation (min) (ACUTE ONLY): 16 min   Charges:   PT Evaluation $PT Eval Low Complexity: 1 Low     PT G Codes:        Roney Marion, PT  Acute Rehabilitation Services Pager [phone] Office [phone]   [place] 01/21/2018, 12:41 PM

## 2018-01-21 NOTE — Progress Notes (Signed)
[name] to be D/C'd Home per MD order.  Discussed with the patient and all questions fully answered.  VSS, Skin clean, dry and intact without evidence of skin break down, no evidence of skin tears noted. IV catheter discontinued intact. Site without signs and symptoms of complications. Dressing and pressure applied.  An After Visit Summary was printed and given to the patient. Patient received prescription.  D/c education completed with patient/family including follow up instructions, medication list, d/c activities limitations if indicated, with other d/c instructions as indicated by MD - patient able to verbalize understanding, all questions fully answered.   Patient instructed to return to ED, call 911, or call MD for any changes in condition.   Patient escorted via [place], and D/C home via private auto.  [name] 01/21/2018 12:33 PM

## 2018-01-22 ENCOUNTER — Telehealth: Payer: Self-pay | Admitting: *Deleted

## 2018-01-22 ENCOUNTER — Encounter (HOSPITAL_COMMUNITY): Payer: Self-pay | Admitting: Gastroenterology

## 2018-01-22 DIAGNOSIS — M797 Fibromyalgia: Secondary | ICD-10-CM | POA: Diagnosis not present

## 2018-01-22 DIAGNOSIS — C220 Liver cell carcinoma: Secondary | ICD-10-CM | POA: Diagnosis not present

## 2018-01-22 DIAGNOSIS — E662 Morbid (severe) obesity with alveolar hypoventilation: Secondary | ICD-10-CM | POA: Diagnosis not present

## 2018-01-22 DIAGNOSIS — J9611 Chronic respiratory failure with hypoxia: Secondary | ICD-10-CM | POA: Diagnosis not present

## 2018-01-22 DIAGNOSIS — K922 Gastrointestinal hemorrhage, unspecified: Secondary | ICD-10-CM | POA: Diagnosis not present

## 2018-01-22 DIAGNOSIS — D62 Acute posthemorrhagic anemia: Secondary | ICD-10-CM | POA: Diagnosis not present

## 2018-01-22 DIAGNOSIS — F329 Major depressive disorder, single episode, unspecified: Secondary | ICD-10-CM | POA: Diagnosis not present

## 2018-01-22 DIAGNOSIS — E785 Hyperlipidemia, unspecified: Secondary | ICD-10-CM | POA: Diagnosis not present

## 2018-01-22 DIAGNOSIS — N183 Chronic kidney disease, stage 3 (moderate): Secondary | ICD-10-CM | POA: Diagnosis not present

## 2018-01-22 DIAGNOSIS — E1122 Type 2 diabetes mellitus with diabetic chronic kidney disease: Secondary | ICD-10-CM | POA: Diagnosis not present

## 2018-01-22 DIAGNOSIS — N179 Acute kidney failure, unspecified: Secondary | ICD-10-CM | POA: Diagnosis not present

## 2018-01-22 DIAGNOSIS — J9612 Chronic respiratory failure with hypercapnia: Secondary | ICD-10-CM | POA: Diagnosis not present

## 2018-01-22 DIAGNOSIS — J449 Chronic obstructive pulmonary disease, unspecified: Secondary | ICD-10-CM | POA: Diagnosis not present

## 2018-01-22 DIAGNOSIS — I8510 Secondary esophageal varices without bleeding: Secondary | ICD-10-CM | POA: Diagnosis not present

## 2018-01-22 DIAGNOSIS — Z9981 Dependence on supplemental oxygen: Secondary | ICD-10-CM | POA: Diagnosis not present

## 2018-01-22 DIAGNOSIS — D500 Iron deficiency anemia secondary to blood loss (chronic): Secondary | ICD-10-CM | POA: Diagnosis not present

## 2018-01-22 DIAGNOSIS — D631 Anemia in chronic kidney disease: Secondary | ICD-10-CM | POA: Diagnosis not present

## 2018-01-22 DIAGNOSIS — K3189 Other diseases of stomach and duodenum: Secondary | ICD-10-CM | POA: Diagnosis not present

## 2018-01-22 DIAGNOSIS — K219 Gastro-esophageal reflux disease without esophagitis: Secondary | ICD-10-CM | POA: Diagnosis not present

## 2018-01-22 DIAGNOSIS — K589 Irritable bowel syndrome without diarrhea: Secondary | ICD-10-CM | POA: Diagnosis not present

## 2018-01-22 DIAGNOSIS — D696 Thrombocytopenia, unspecified: Secondary | ICD-10-CM | POA: Diagnosis not present

## 2018-01-22 DIAGNOSIS — I5032 Chronic diastolic (congestive) heart failure: Secondary | ICD-10-CM | POA: Diagnosis not present

## 2018-01-22 DIAGNOSIS — I130 Hypertensive heart and chronic kidney disease with heart failure and stage 1 through stage 4 chronic kidney disease, or unspecified chronic kidney disease: Secondary | ICD-10-CM | POA: Diagnosis not present

## 2018-01-22 DIAGNOSIS — K7460 Unspecified cirrhosis of liver: Secondary | ICD-10-CM | POA: Diagnosis not present

## 2018-01-22 DIAGNOSIS — R188 Other ascites: Secondary | ICD-10-CM | POA: Diagnosis not present

## 2018-01-22 LAB — BPAM RBC
BLOOD PRODUCT EXPIRATION DATE: 201907082359
Blood Product Expiration Date: 201907092359
Blood Product Expiration Date: 201907092359
ISSUE DATE / TIME: 201906161843
UNIT TYPE AND RH: 7300
Unit Type and Rh: 7300
Unit Type and Rh: 7300

## 2018-01-22 LAB — TYPE AND SCREEN
ABO/RH(D): B POS
ANTIBODY SCREEN: NEGATIVE
UNIT DIVISION: 0
UNIT DIVISION: 0
UNIT DIVISION: 0

## 2018-01-22 NOTE — Telephone Encounter (Signed)
Called patient on 01/22/2018 , 8:49 AM in an attempt to reach the patient for a hospital follow up.   Admit date: 01/19/18 Discharge: 01/21/18   She does not  have any questions or concerns about medications from the hospital admission. The patient's medications were reviewed over the phone, they were counseled to bring in all current medications to the hospital follow up visit.   I advised the patient to call if any questions or concerns arise about the hospital admission or medications    Home health WAS NOT  started in the hospital.  All questions were answered and a follow up appointment was made.   Prior to Admission medications   Medication Sig Start Date End Date Taking? Authorizing Provider  albuterol (PROVENTIL HFA;VENTOLIN HFA) 108 (90 Base) MCG/ACT inhaler Inhale 2 puffs into the lungs every 6 (six) hours as needed for wheezing or shortness of breath. 05/27/16   Ward, Delice Bison, DO  allopurinol (ZYLOPRIM) 300 MG tablet take 1 tablet (300 mg) by mouth daily with supper - for gout 09/28/17   Allie Bossier, MD  ALPRAZolam Duanne Moron) 0.25 MG tablet Take 1 tab as needed for anxiety, cramping, air hunger every 8 hours as needed. 11/01/17   Liane Comber, NP  aspirin EC 81 MG tablet Take 81 mg by mouth at bedtime.    [provider]  bisoprolol (ZEBETA) 10 MG tablet Take 1 tablet (10 mg total) by mouth daily. 10/17/17   Unk Pinto, MD  Cholecalciferol (VITAMIN D3) 5000 units CAPS Take 5,000 Units by mouth daily with breakfast.    [provider]  citalopram (CELEXA) 20 MG tablet Take 1 tablet (20 mg total) by mouth daily with supper. Patient taking differently: Take 20 mg by mouth 2 (two) times daily.  09/28/17   Allie Bossier, MD  cyclobenzaprine (FLEXERIL) 10 MG tablet Take 1 tablet (10 mg total) by mouth 3 (three) times daily as needed for muscle spasms. 03/06/16   Unk Pinto, MD  ferrous sulfate 325 (65 FE) MG tablet Take 325 mg by mouth 3 (three) times daily  with meals.     [provider]  gabapentin (NEURONTIN) 600 MG tablet Take 600 mg by mouth 2 (two) times daily.     [provider]  Glycopyrrolate-Formoterol (BEVESPI AEROSPHERE) 9-4.8 MCG/ACT AERO Inhale 2 puffs into the lungs 2 (two) times daily. 12/25/17   Tanda Rockers, MD  lactulose (CHRONULAC) 10 GM/15ML solution take 105ms TWICE DAILY Patient taking differently: Take 20 g by mouth daily.  08/08/17   MUnk Pinto MD  Magnesium 250 MG TABS Take 250 mg by mouth at bedtime.     [provider]  ondansetron (ZOFRAN) 4 MG tablet Take 1 tablet (4 mg total) by mouth every 6 (six) hours as needed for nausea or vomiting. 09/02/17   CVicie Mutters PA-C  OXYGEN Inhale 2-3 L into the lungs continuous. 3 L with exertion    [provider]  potassium chloride (K-DUR) 10 MEQ tablet Take 2 tablet 3 times a day. Patient taking differently: Take 20 mEq by mouth 3 (three) times daily. Take 2 tablet 3 times a day. 11/18/17   MUnk Pinto MD  rifampin (RIFADIN) 300 MG capsule Take 300 mg by mouth 2 (two) times daily.     [provider]  spironolactone (ALDACTONE) 25 MG tablet Take 25 mg by mouth 2 (two) times daily.    [provider]  torsemide (DEMADEX) 20 MG tablet Take 3 tablets  2 x/ day  Morning & Nite for fluid Retention & Swelling Patient taking differently: Take 40 mg by mouth 2 (two) times daily.  11/25/17   Unk Pinto, MD  traMADol Veatrice Bourbon) 50 MG tablet Take 50 mg by mouth every 12 hours as needed for pain for chronic pain 11/18/17   Vicie Mutters, PA-C

## 2018-01-23 ENCOUNTER — Telehealth: Payer: Self-pay | Admitting: *Deleted

## 2018-01-23 ENCOUNTER — Encounter: Payer: Self-pay | Admitting: *Deleted

## 2018-01-23 NOTE — Telephone Encounter (Signed)
Per Dr Melford Aase, it is OK for the patient to PT 1 time a week x 1 week, 2 x a week x 1 week and then, 1 time a week x 2 weeks.

## 2018-01-24 ENCOUNTER — Ambulatory Visit: Payer: PPO | Admitting: Podiatry

## 2018-01-25 DIAGNOSIS — J441 Chronic obstructive pulmonary disease with (acute) exacerbation: Secondary | ICD-10-CM | POA: Diagnosis not present

## 2018-01-25 DIAGNOSIS — J449 Chronic obstructive pulmonary disease, unspecified: Secondary | ICD-10-CM | POA: Diagnosis not present

## 2018-01-25 DIAGNOSIS — J9610 Chronic respiratory failure, unspecified whether with hypoxia or hypercapnia: Secondary | ICD-10-CM | POA: Diagnosis not present

## 2018-01-25 LAB — CULTURE, BODY FLUID W GRAM STAIN -BOTTLE: Culture: NO GROWTH

## 2018-01-25 LAB — CULTURE, BODY FLUID-BOTTLE

## 2018-01-27 ENCOUNTER — Other Ambulatory Visit: Payer: Self-pay

## 2018-01-27 ENCOUNTER — Ambulatory Visit (INDEPENDENT_AMBULATORY_CARE_PROVIDER_SITE_OTHER): Payer: PPO | Admitting: Internal Medicine

## 2018-01-27 ENCOUNTER — Encounter: Payer: Self-pay | Admitting: Internal Medicine

## 2018-01-27 VITALS — BP 122/54 | HR 76 | Temp 97.7°F | Resp 18 | Ht 62.0 in | Wt 192.0 lb

## 2018-01-27 DIAGNOSIS — I10 Essential (primary) hypertension: Secondary | ICD-10-CM

## 2018-01-27 DIAGNOSIS — C220 Liver cell carcinoma: Secondary | ICD-10-CM

## 2018-01-27 DIAGNOSIS — D62 Acute posthemorrhagic anemia: Secondary | ICD-10-CM | POA: Diagnosis not present

## 2018-01-27 DIAGNOSIS — K7581 Nonalcoholic steatohepatitis (NASH): Secondary | ICD-10-CM

## 2018-01-27 DIAGNOSIS — D696 Thrombocytopenia, unspecified: Secondary | ICD-10-CM | POA: Diagnosis not present

## 2018-01-27 DIAGNOSIS — J9612 Chronic respiratory failure with hypercapnia: Secondary | ICD-10-CM

## 2018-01-27 DIAGNOSIS — N183 Chronic kidney disease, stage 3 unspecified: Secondary | ICD-10-CM

## 2018-01-27 DIAGNOSIS — D500 Iron deficiency anemia secondary to blood loss (chronic): Secondary | ICD-10-CM

## 2018-01-27 DIAGNOSIS — Z79899 Other long term (current) drug therapy: Secondary | ICD-10-CM | POA: Diagnosis not present

## 2018-01-27 DIAGNOSIS — K7460 Unspecified cirrhosis of liver: Secondary | ICD-10-CM

## 2018-01-27 DIAGNOSIS — J9611 Chronic respiratory failure with hypoxia: Secondary | ICD-10-CM | POA: Diagnosis not present

## 2018-01-27 DIAGNOSIS — J449 Chronic obstructive pulmonary disease, unspecified: Secondary | ICD-10-CM | POA: Diagnosis not present

## 2018-01-27 DIAGNOSIS — I5032 Chronic diastolic (congestive) heart failure: Secondary | ICD-10-CM

## 2018-01-27 DIAGNOSIS — I8511 Secondary esophageal varices with bleeding: Secondary | ICD-10-CM

## 2018-01-27 NOTE — Patient Instructions (Signed)

## 2018-01-27 NOTE — Progress Notes (Signed)
[place]     This very nice 72 y.o. MWF  was admitted to the hospital on  01/19/2018 and patient was discharged from the hospital on  01/21/2018. The patient now presents for follow up for transition from recent hospitalization.  The day after discharge  our clinical staff contacted the patient to assure stability and schedule a follow up appointment. The discharge summary, medications and diagnostic test results were reviewed before meeting with the patient. The patient was admitted for:   Acute blood loss anemia  Secondary esophageal varices with bleeding (HCC) Essential hypertension  Thrombocytopenia (HCC) Stage 4 very severe COPD by GOLD classification ([place])  Hepatocellular carcinoma (HCC)  Liver cirrhosis secondary to NASH (nonalcoholic steatohepatitis) (HCC)  CKD (chronic kidney disease) stage 3, GFR 30-59 ml/min (HCC) Chronic diastolic CHF (congestive heart failure) (HCC) Chronic respiratory failure with hypoxia and hypercapnia (HCC)  Iron deficiency anemia due to chronic blood loss        Patient is a 72 yo MWF with multiple end stage co-morbidities including O2 Dependent COPD, HTN, NASH w/Cirrhosis and portal HTN w/ hx/o Hepatic Encephalopathy and hx/o  RFA of #2 hepatomas in Jan 2019. She was hospitalized 6/16-18 with worsening anemia - Hgb 7.1 gm% and was transfused 1 u pRBC. EGD 6/17 which showed nonbleeding esophageal varices and portal hypertensive gastropathy. She also received iv iron and was started on oral iron. She had a therapeutic paracentesis of 700 cc with benign sediment. Since home, husband is concerned re: her wt gain and worsening abdominal distention since decreasing her diuretics.      Hospitalization discharge instructions and medications are reconciled with the patient & caretaker spouse.      Patient is also followed with hx/o Hypertension, Hyperlipidemia, Pre-Diabetes and Vitamin D Deficiency.       Today'sBP is stable at 122/54. Patient has  had no complaints of any cardiac type chest pain, palpitations, dyspnea/orthopnea/PND, dizziness, claudication, or dependent edema.     Hyperlipidemia is controlled with diet & meds. Patient denies myalgias or other med SE's. Last Lipids were  Lab Results  Component Value Date   CHOL 177 05/31/2017   HDL 53 05/31/2017   LDLCALC 101 (H) 05/31/2017   TRIG 130 05/31/2017   CHOLHDL 3.3 05/31/2017      Also, the patient has history of T2_NIDDM PreDiabetes and has had no symptoms of reactive hypoglycemia, diabetic polys, paresthesias or visual blurring.  Last A1c was  Lab Results  Component Value Date   HGBA1C 5.1 09/03/2017      Further, the patient also has history of Vitamin D Deficiency and supplements vitamin D without any suspected side-effects. Last vitamin D was   Lab Results  Component Value Date   VD25OH 59 09/03/2017   Current Outpatient Medications on File Prior to Visit  Medication Sig  . albuterol (PROVENTIL HFA;VENTOLIN HFA) 108 (90 Base) MCG/ACT inhaler Inhale 2 puffs into the lungs every 6 (six) hours as needed for wheezing or shortness of breath.  . allopurinol (ZYLOPRIM) 300 MG tablet take 1 tablet (300 mg) by mouth daily with supper - for gout  . ALPRAZolam (XANAX) 0.25 MG tablet Take 1 tab as needed for anxiety, cramping, air hunger every 8 hours as needed.  Marland Kitchen aspirin EC 81 MG tablet Take 81 mg by mouth at bedtime.  . bisoprolol (ZEBETA) 10 MG tablet Take 1 tablet (10 mg total) by mouth daily.  . Cholecalciferol (VITAMIN D3) 5000 units CAPS Take 5,000 Units by mouth  daily with breakfast.  . citalopram (CELEXA) 20 MG tablet Take 1 tablet (20 mg total) by mouth daily with supper. (Patient taking differently: Take 20 mg by mouth 2 (two) times daily. )  . cyclobenzaprine (FLEXERIL) 10 MG tablet Take 1 tablet (10 mg total) by mouth 3 (three) times daily as needed for muscle spasms.  . ferrous sulfate 325 (65 FE) MG tablet Take 325 mg by mouth 3 (three) times daily with meals.    . gabapentin (NEURONTIN) 600 MG tablet Take 600 mg by mouth 3 (three) times daily.   . Glycopyrrolate-Formoterol (BEVESPI AEROSPHERE) 9-4.8 MCG/ACT AERO Inhale 2 puffs into the lungs 2 (two) times daily.  Marland Kitchen lactulose (CHRONULAC) 10 GM/15ML solution take 71ms TWICE DAILY (Patient taking differently: Take 20 g by mouth daily. )  . Magnesium 250 MG TABS Take 250 mg by mouth at bedtime.   . ondansetron (ZOFRAN) 4 MG tablet Take 1 tablet (4 mg total) by mouth every 6 (six) hours as needed for nausea or vomiting.  . OXYGEN Inhale 2-3 L into the lungs continuous. 3 L with exertion  . potassium chloride (K-DUR) 10 MEQ tablet Take 2 tablet 3 times a day. (Patient taking differently: Take 20 mEq by mouth 3 (three) times daily. Take 2 tablet 3 times a day.)  . rifampin (RIFADIN) 300 MG capsule Take 300 mg by mouth 2 (two) times daily.   .Marland Kitchenspironolactone (ALDACTONE) 25 MG tablet Take 25 mg by mouth 2 (two) times daily.  .Marland Kitchentorsemide (DEMADEX) 20 MG tablet Take 3 tablets 2 x/ day  Morning & Nite for fluid Retention & Swelling (Patient taking differently: Take 40 mg by mouth 2 (two) times daily. )  . traMADol (ULTRAM) 50 MG tablet Take 50 mg by mouth every 12 hours as needed for pain for chronic pain   No current facility-administered medications on file prior to visit.    Allergies  Allergen Reactions  . Atorvastatin Other (See Comments)    Unknown reaction  . Diphenhydramine Hcl (Sleep) Hives  . Hydrocodone-Acetaminophen Other (See Comments)    Unknown reaction  . Lopid [Gemfibrozil] Other (See Comments)    Unknown reaction  . Loratadine Hives  . Lorazepam Hives  . Simvastatin Other (See Comments)    Unknown reaction  . Sulfamethoxazole Hives  . Sulfonamide Derivatives Hives   PMHx:   Past Medical History:  Diagnosis Date  . Asthma   . Cancer ([facility]    liver cancer   . COPD (chronic obstructive pulmonary disease) ([place]    x 3 years   . Depression   . Diabetes mellitus without  complication ([facility]    diet controlled  not on meds   . Edema    left leg   . Esophageal [place]ices ([place]   . Fibromyalgia   . Gastritis   . GERD (gastroesophageal reflux disease)   . Hepatic cirrhosis ([facility]   . Hepatic encephalopathy ([facility]   . History of blood transfusion   . Hyperlipidemia   . Hypertension   . IBS (irritable bowel syndrome)   . Phlebitis    left leg x 2   . Pneumonia    hx of   . Splenomegaly   . Vitamin D deficiency    Immunization History  Administered Date(s) Administered  . Hep A / Hep B 11/05/2013, 11/12/2013, 12/07/2013, 12/31/2014  . Hepatitis B, adult 12/19/2015  . Hepatitis B, ped/adol 11/17/2015, 05/21/2016  . Influenza Split 05/26/2013, 06/17/2014  . Influenza, High Dose Seasonal PF  06/15/2015, 05/01/2017  . Influenza,inj,quad, With Preservative 05/22/2016  . Pneumococcal Conjugate-13 07/22/2015  . Pneumococcal Polysaccharide-23 08/28/2013  . Tdap 11/19/2012   Past Surgical History:  Procedure Laterality Date  . ABDOMINAL HYSTERECTOMY    . BACK SURGERY    . CATARACT EXTRACTION, BILATERAL    . CHOLECYSTECTOMY    . ESOPHAGOGASTRODUODENOSCOPY  07/16/2012   Procedure: ESOPHAGOGASTRODUODENOSCOPY (EGD);  Surgeon: Inda Castle, MD;  Location: [facility];  Service: Endoscopy;  Laterality: N/A;  . ESOPHAGOGASTRODUODENOSCOPY N/A 01/20/2018   Procedure: ESOPHAGOGASTRODUODENOSCOPY (EGD);  Surgeon: Ladene Artist, MD;  Location[facility];  Service: Endoscopy;  Laterality: N/A;  . GASTRIC VARICES BANDING  07/16/2012   Procedure: GASTRIC VARICES BANDING;  Surgeon: Inda Castle, MD;  Location: [facility] ENDOSCOPY;  Service: Endoscopy;  Laterality: N/A;  . HIP ARTHROPLASTY Bilateral   . IR GENERIC HISTORICAL  06/25/2016   IR RADIOLOGIST EVAL & MGMT 06/25/2016 [facility]  . IR GENERIC HISTORICAL  06/27/2016   IR KYPHO LUMBAR INC FX REDUCE BONE BX UNI/BIL CANNULATION INC/IMAGING 06/27/2016 Luanne Bras, MD [facility]  . IR GENERIC HISTORICAL   07/17/2016   IR RADIOLOGIST EVAL & MGMT 07/17/2016 [facility]  . IR PARACENTESIS  01/20/2018  . IR RADIOLOGIST EVAL & MGMT  07/10/2017  . IR RADIOLOGIST EVAL & MGMT  10/01/2017  . IR RADIOLOGIST EVAL & MGMT  12/11/2017  . IR THORACENTESIS ASP PLEURAL SPACE W/IMG GUIDE  09/12/2017  . IR THORACENTESIS ASP PLEURAL SPACE W/IMG GUIDE  09/17/2017  . NECK SURGERY    . RADIOFREQUENCY ABLATION N/A 08/30/2017   Procedure: CT MICROWAVE THERMAL ABLATION;  Surgeon: Arne Cleveland, MD;  Location: [facility] ORS;  Service: Anesthesiology;  Laterality: N/A;  . SHOULDER SURGERY Right    FHx:    Reviewed / unchanged  SHx:    Reviewed / unchanged   Systems Review:  Constitutional: Denies fever, chills, wt changes, headaches, insomnia, fatigue, night sweats, change in appetite. Eyes: Denies redness, blurred vision, diplopia, discharge, itchy, watery eyes.  ENT: Denies discharge, congestion, post nasal drip, epistaxis, sore throat, earache, hearing loss, dental pain, tinnitus, vertigo, sinus pain, snoring.  CV: Denies chest pain, palpitations, irregular heartbeat, syncope, dyspnea, diaphoresis, orthopnea, PND, claudication or edema. Respiratory: denies cough, dyspnea, DOE, pleurisy, hoarseness, laryngitis, wheezing.  Gastrointestinal: Denies dysphagia, odynophagia, heartburn, reflux, water brash, abdominal pain or cramps, nausea, vomiting, bloating, diarrhea, constipation, hematemesis, melena, hematochezia  or hemorrhoids. Genitourinary: Denies dysuria, frequency, urgency, nocturia, hesitancy, discharge, hematuria or flank pain. Musculoskeletal: Denies arthralgias, myalgias, stiffness, jt. swelling, pain, limping or strain/sprain.  Skin: Denies pruritus, rash, hives, warts, acne, eczema or change in skin lesion(s). Neuro: No weakness, tremor, incoordination, spasms, paresthesia or pain. Psychiatric: Denies confusion, memory loss or sensory loss. Endo: Denies change in weight, skin or hair change.  Heme/Lymph: No  excessive bleeding, bruising or enlarged lymph nodes.  Physical Exam  BP (!) 122/54   Pulse 76   Temp 97.7 F (36.5 C)   Resp 18   Ht 5' 2"  (1.575 m)   Wt 192 lb (87.1 kg)   BMI 35.12 kg/m   Appears well nourished, well groomed  and in no distress.  Eyes: PERRLA, EOMs, conjunctiva no swelling or erythema. Sinuses: No frontal/maxillary tenderness ENT/Mouth: EAC's clear, TM's nl w/o erythema, bulging. Nares clear w/o erythema, swelling, exudates. Oropharynx clear without erythema or exudates. Oral hygiene is good. Tongue normal, non obstructing. Hearing intact.  Neck: Supple. Thyroid nl. Car 2+/2+ without bruits, nodes or JVD. Chest: Respirations nl with  BS clear & equal w/o rales, rhonchi, wheezing or stridor.  Cor: Heart sounds normal w/ regular rate and rhythm without sig. murmurs, gallops, clicks or rubs. Peripheral pulses normal and equal  without edema.  Abdomen: Tense ascitic distention & bowel sounds normal. Non-tender w/o guarding, rebound, hernias, masses or organomegaly.  Lymphatics: Unremarkable.  Musculoskeletal: Full ROM all peripheral extremities, joint stability, 5/5 strength and normal gait.  Skin: Warm, dry without exposed rashes, lesions or ecchymosis apparent.  Neuro: Cranial nerves intact, reflexes equal bilaterally. Sensory-motor testing grossly intact. Tendon reflexes grossly intact.  Pysch: Alert & oriented x 3.  Insight and judgement nl & appropriate. No ideations.  Assessment and Plan:  1. Acute blood loss anemia  - CBC with Differential/Platelet  2. Secondary esophageal varices with bleeding (HCC)  - CBC with Differential/Platelet  3. Essential hypertension  - Continue medication, monitor blood pressure at home.  - Continue DASH diet. Reminder to go to the ER if any CP,  SOB, nausea, dizziness, severe HA, changes vision/speech.  - CBC with Differential/Platelet - COMPLETE METABOLIC PANEL WITH GFR  4. Thrombocytopenia (HCC)  - CBC with  Differential/Platelet  5. Stage 4 very severe COPD by GOLD classification ([place])  - COMPLETE METABOLIC PANEL WITH GFR  6. Hepatocellular carcinoma (HCC)  - COMPLETE METABOLIC PANEL WITH GFR  7. Liver cirrhosis secondary to NASH (nonalcoholic steatohepatitis) ([place])  - Resume previous diuretic dosing in consideration of her worsening ascites, ie., increase Torsemide back from 2 to 3 tabs bid and prn Metolazone   - COMPLETE METABOLIC PANEL WITH GFR  8. CKD (chronic kidney disease) stage 3, GFR 30-59 ml/min (HCC)  - COMPLETE METABOLIC PANEL WITH GFR  9. Chronic diastolic CHF (congestive heart failure) ([place])  10. Chronic respiratory failure with hypoxia and hypercapnia (HCC)  - COMPLETE METABOLIC PANEL WITH GFR  11. Iron deficiency anemia due to chronic blood loss  - CBC with Differential/Platelet  12. Medication management  - CBC with Differential/Platelet - COMPLETE METABOLIC PANEL WITH GFR       Discussed  regular exercise, BP monitoring, weight control to achieve/maintain BMI less than 25 and discussed meds and SE's. Recommended labs to assess and monitor clinical status with further disposition pending results of labs. Over 30 minutes of exam, counseling, chart review was performed.

## 2018-01-27 NOTE — Patient Outreach (Signed)
[facility]) Care Management  [place]   01/27/2018  [name] 06/20/1946 [id number]  72 year old female referred to [place] services requested for 30 day post discharge medication review.  PMHx includes, but not limited to, nonalcoholic cirrhosis with esophageal varices, hypertension, atrial tachycardia, chronic diastolic heard failure, COPD, GERD, IBS,  diverticulosis and hyperlipidemia.   Successful outreach attempt to Mrs. Tye's husband, [name] who manages her medications.  HIPAA identifiers verified.   Subjective: Mr. Elms reports that Mrs. Higdon is "up" about 10 lbs since her last hospitalization.  He reports her weight as 192 lbs today and 188 or 189 lbs yesterday.  He states that she is on 2.5 Liter of oxygen.   He states that she has a doctor's appointment this afternoon at 3:15pm with PCP and anticipates that her diuretic dose will be increased.  Mr. Greis reports that his wife has six Bevesip Aerosphere sample inhalers.  Objective:  SCr 1.37 mg/dL HgA1c 5.1% on 09/03/17  Current Medications: Current Outpatient Medications  Medication Sig Dispense Refill  . albuterol (PROVENTIL HFA;VENTOLIN HFA) 108 (90 Base) MCG/ACT inhaler Inhale 2 puffs into the lungs every 6 (six) hours as needed for wheezing or shortness of breath. 1 Inhaler 2  . allopurinol (ZYLOPRIM) 300 MG tablet take 1 tablet (300 mg) by mouth daily with supper - for gout 30 tablet 0  . ALPRAZolam (XANAX) 0.25 MG tablet Take 1 tab as needed for anxiety, cramping, air hunger every 8 hours as needed. 90 tablet 0  . aspirin EC 81 MG tablet Take 81 mg by mouth at bedtime.    . bisoprolol (ZEBETA) 10 MG tablet Take 1 tablet (10 mg total) by mouth daily. 90 tablet 0  . Cholecalciferol (VITAMIN D3) 5000 units CAPS Take 5,000 Units by mouth daily with breakfast.    . citalopram (CELEXA) 20 MG tablet Take 1 tablet (20 mg total) by mouth daily with supper. (Patient taking differently: Take  20 mg by mouth 2 (two) times daily. ) 30 tablet 0  . cyclobenzaprine (FLEXERIL) 10 MG tablet Take 1 tablet (10 mg total) by mouth 3 (three) times daily as needed for muscle spasms. 90 tablet 2  . ferrous sulfate 325 (65 FE) MG tablet Take 325 mg by mouth 3 (three) times daily with meals.     . gabapentin (NEURONTIN) 600 MG tablet Take 600 mg by mouth 3 (three) times daily.     . Glycopyrrolate-Formoterol (BEVESPI AEROSPHERE) 9-4.8 MCG/ACT AERO Inhale 2 puffs into the lungs 2 (two) times daily. 1 Inhaler 11  . lactulose (CHRONULAC) 10 GM/15ML solution take 64ms TWICE DAILY (Patient taking differently: Take 20 g by mouth daily. ) 2700 mL 2  . Magnesium 250 MG TABS Take 250 mg by mouth at bedtime.     . ondansetron (ZOFRAN) 4 MG tablet Take 1 tablet (4 mg total) by mouth every 6 (six) hours as needed for nausea or vomiting. 30 tablet 1  . OXYGEN Inhale 2-3 L into the lungs continuous. 3 L with exertion    . potassium chloride (K-DUR) 10 MEQ tablet Take 2 tablet 3 times a day. (Patient taking differently: Take 20 mEq by mouth 3 (three) times daily. Take 2 tablet 3 times a day.) 180 tablet 2  . rifampin (RIFADIN) 300 MG capsule Take 300 mg by mouth 2 (two) times daily.     .Marland Kitchenspironolactone (ALDACTONE) 25 MG tablet Take 25 mg by mouth 2 (two) times daily.    .Marland Kitchen  torsemide (DEMADEX) 20 MG tablet Take 3 tablets 2 x/ day  Morning & Nite for fluid Retention & Swelling (Patient taking differently: Take 40 mg by mouth 2 (two) times daily. ) 180 tablet 5  . traMADol (ULTRAM) 50 MG tablet Take 50 mg by mouth every 12 hours as needed for pain for chronic pain 60 tablet 0   No current facility-administered medications for this visit.     Functional Status: In your present state of health, do you have any difficulty performing the following activities: 10/22/2017 09/17/2017  Hearing? Y Y  Comment - -  Vision? N N  Difficulty concentrating or making decisions? N N  Walking or climbing stairs? Y Y  Dressing or  bathing? Y Y  Doing errands, shopping? Y Y  Some recent data might be hidden    Fall/Depression Screening: Fall Risk  10/22/2017 08/08/2017 01/11/2017  Falls in the past year? No No No  Comment - - -  Number falls in past yr: - - -  Injury with Fall? - - -  Risk Factor Category  - - -  Risk for fall due to : - - History of fall(s);Impaired balance/gait;Impaired mobility  Risk for fall due to: Comment - - -  Follow up - - -  Comment - - -   PHQ 2/9 Scores 10/22/2017 08/08/2017 01/11/2017 12/04/2016 11/07/2016 07/19/2016 04/19/2016  PHQ - 2 Score 2 - 4 0 1 0 0  PHQ- 9 Score 4 - - - - - -  Exception Documentation - Other- indicate reason in comment box - - - - -  Not completed - call completed with spouse - - - - -   ASSESSMENT: Date Discharged from Hospital: 01/19/18 Date Medication Reconciliation Performed: 01/27/2018  No new medications were prescribed at discharge.  Patient was recently discharged from hospital and all medications have been reviewed  Drugs sorted by system:  Neurologic/Psychologic: alprazolam, citalopram, gabapentin  Cardiovascular: aspirin, bisoprolol, potassium chloride, spironolactone, torsemide   Pulmonary/Allergy: albuterol MDI, glycopyrrolate/formoterol MDI  Gastrointestinal: lactulose, ondansetron  Pain: cyclobenzaprine, tramadol  Vitamins/Minerals: cholecalciferol,  Ferrous sulfate, magnesium   Infectious Diseases: rifampin  Miscellaneous: allopurinol  Duplications in therapy:  Gaps in therapy:  No current medications for diabetes.  Patient's husband reports his wife is diet controlled after glipizide discontinued at last hospitalization. HgA1c is good at 5.1%.  Patient with Type 2 diabetes mellitus and not on statin therapy.  Unknown allergy to atorvastatin and simvastatin documented.   Medications to avoid in the elderly:  Per the [employer]- cyclobenzaprine may be poorly tolerated by older adults due to anticholinergic adverse effects, sedation and  increased risk of fractures.  Effectiveness at dosages tolerated by older adults is questionable.  There is strong evidence to avoid use in the elderly.    Per the [employer] List, alprazolam may have decreased metabolism and increases sensitivity in older adults.  Risk of cognitive impairment, delirium, falls, fractures and motor vehicle accidents may occur. There is strong evidence to avoid use in the elderly.  Plan: Route note to PCP to see if he feels like trying another statin in this patient.   [name], PharmD Clinical Pharmacist [place] [phone]

## 2018-01-28 ENCOUNTER — Other Ambulatory Visit: Payer: Self-pay | Admitting: Physician Assistant

## 2018-01-28 LAB — CBC WITH DIFFERENTIAL/PLATELET
BASOS ABS: 28 {cells}/uL (ref 0–200)
Basophils Relative: 0.6 %
EOS ABS: 183 {cells}/uL (ref 15–500)
EOS PCT: 3.9 %
HCT: 26.9 % — ABNORMAL LOW (ref 35.0–45.0)
HEMOGLOBIN: 8.8 g/dL — AB (ref 11.7–15.5)
LYMPHS ABS: 959 {cells}/uL (ref 850–3900)
MCH: 32.4 pg (ref 27.0–33.0)
MCHC: 32.7 g/dL (ref 32.0–36.0)
MCV: 98.9 fL (ref 80.0–100.0)
MPV: 12.1 fL (ref 7.5–12.5)
Monocytes Relative: 5.6 %
NEUTROS ABS: 3267 {cells}/uL (ref 1500–7800)
Neutrophils Relative %: 69.5 %
Platelets: 96 10*3/uL — ABNORMAL LOW (ref 140–400)
RBC: 2.72 10*6/uL — ABNORMAL LOW (ref 3.80–5.10)
RDW: 15.7 % — ABNORMAL HIGH (ref 11.0–15.0)
Total Lymphocyte: 20.4 %
WBC mixed population: 263 cells/uL (ref 200–950)
WBC: 4.7 10*3/uL (ref 3.8–10.8)

## 2018-01-28 LAB — COMPLETE METABOLIC PANEL WITH GFR
AG Ratio: 1.4 (calc) (ref 1.0–2.5)
ALKALINE PHOSPHATASE (APISO): 120 U/L (ref 33–130)
ALT: 19 U/L (ref 6–29)
AST: 27 U/L (ref 10–35)
Albumin: 2.7 g/dL — ABNORMAL LOW (ref 3.6–5.1)
BILIRUBIN TOTAL: 1 mg/dL (ref 0.2–1.2)
BUN/Creatinine Ratio: 19 (calc) (ref 6–22)
BUN: 23 mg/dL (ref 7–25)
CHLORIDE: 103 mmol/L (ref 98–110)
CO2: 32 mmol/L (ref 20–32)
CREATININE: 1.22 mg/dL — AB (ref 0.60–0.93)
Calcium: 8.6 mg/dL (ref 8.6–10.4)
GFR, Est African American: 52 mL/min/{1.73_m2} — ABNORMAL LOW (ref >=60)
GFR, Est Non African American: 45 mL/min/{1.73_m2} — ABNORMAL LOW (ref >=60)
GLUCOSE: 124 mg/dL — AB (ref 65–99)
Globulin: 1.9 g/dL (calc) (ref 1.9–3.7)
Potassium: 4.1 mmol/L (ref 3.5–5.3)
SODIUM: 140 mmol/L (ref 135–146)
Total Protein: 4.6 g/dL — ABNORMAL LOW (ref 6.1–8.1)

## 2018-01-31 ENCOUNTER — Ambulatory Visit (INDEPENDENT_AMBULATORY_CARE_PROVIDER_SITE_OTHER): Payer: PPO | Admitting: Podiatry

## 2018-01-31 ENCOUNTER — Encounter: Payer: Self-pay | Admitting: Podiatry

## 2018-01-31 ENCOUNTER — Telehealth: Payer: Self-pay | Admitting: Internal Medicine

## 2018-01-31 DIAGNOSIS — E1151 Type 2 diabetes mellitus with diabetic peripheral angiopathy without gangrene: Secondary | ICD-10-CM | POA: Diagnosis not present

## 2018-01-31 DIAGNOSIS — B351 Tinea unguium: Secondary | ICD-10-CM | POA: Diagnosis not present

## 2018-01-31 NOTE — Telephone Encounter (Signed)
Dr. Vaughan Browner will you be able to take pt on as a pt? He saw Dr. Melvyn Novas  for COPD. Please advise.

## 2018-01-31 NOTE — Telephone Encounter (Signed)
Patient is requesting to been seen by a different provider.   MW please advise if ok to switch

## 2018-01-31 NOTE — Telephone Encounter (Signed)
OK with me.

## 2018-01-31 NOTE — Telephone Encounter (Signed)
Called patient, unable to reach left message to give us a call back. 

## 2018-01-31 NOTE — Telephone Encounter (Signed)
Fine with me

## 2018-02-02 NOTE — Progress Notes (Signed)
  Subjective:  Patient ID: [name], female    DOB: 10-Mar-1946,  MRN: [id number]  Chief Complaint  Patient presents with  . Nail Problem    "He's going to do my toenails.  Some of them hurt.  I'm borderline Diabetic, I don't take any medicine for it."   72 y.o. female returns for diabetic foot care.  Complains of thickening to the toenails Objective:   General AA&O x3. Normal mood and affect.  Vascular Dorsalis pedis pulses present 1+ bilaterally  Posterior tibial pulses absent bilaterally  Capillary refill normal to all digits. Pedal hair growth normal.  Neurologic Epicritic sensation present bilaterally. Protective sensation with 5.07 monofilament  present bilaterally. Vibratory sensation present bilaterally.  Dermatologic No open lesions. Interspaces clear of maceration.  Normal skin temperature and turgor. Hyperkeratotic lesions: None bilaterally. Nails: brittle, onychomycosis, thickening, elongation  Orthopedic: No history of amputation. MMT 5/5 in dorsiflexion, plantarflexion, inversion, and eversion. Normal lower extremity joint ROM without pain or crepitus.     Assessment & Plan:  Patient was evaluated and treated and all questions answered.  Diabetes with PAD, Onychomycosis -Educated on diabetic footcare. Diabetic risk level 1 -Nails x10 debrided sharply and manually with large nail nipper and rotary burr.  Return in about 3 months (around 05/03/2018) for Diabetic Foot Care.

## 2018-02-03 ENCOUNTER — Ambulatory Visit: Payer: Self-pay | Admitting: Internal Medicine

## 2018-02-03 NOTE — Telephone Encounter (Signed)
Attempted to contact pt. I did not receive an answer. There was no option for me to leave a message due to her voicemail not being set up. Will try back.

## 2018-02-04 NOTE — Telephone Encounter (Signed)
Called pt and spoke with spouse [name] stating that both Dr. Melvyn Novas and Dr. Vaughan Browner were fine for pt to switch providers.  Pt's appt that was originally scheduled with Dr. Melvyn Novas Friday, 7/5 was cancelled due to pt not wanting to see Dr. Melvyn Novas and a 2mn appt was scheduled for pt 8/6 to see Dr. MVaughan Browner  Nothing further needed.

## 2018-02-07 ENCOUNTER — Other Ambulatory Visit: Payer: Self-pay

## 2018-02-07 ENCOUNTER — Ambulatory Visit: Payer: PPO | Admitting: Internal Medicine

## 2018-02-11 ENCOUNTER — Other Ambulatory Visit: Payer: Self-pay | Admitting: Internal Medicine

## 2018-02-18 ENCOUNTER — Telehealth: Payer: Self-pay | Admitting: *Deleted

## 2018-02-18 NOTE — Telephone Encounter (Signed)
Spouse called and reported the patient gained 3.5 pounds since yesterday. She is taking torsemide 20 mg 3 tablets twice a day and Metalozone 1 tablet mid- day. Per Dr Melford Aase, increase her Spironolactone 25 mg to 2 tablets 3 times a day and keep monitoring her weight.

## 2018-02-19 ENCOUNTER — Ambulatory Visit: Payer: PPO | Admitting: Physician Assistant

## 2018-02-19 ENCOUNTER — Other Ambulatory Visit (INDEPENDENT_AMBULATORY_CARE_PROVIDER_SITE_OTHER): Payer: PPO

## 2018-02-19 ENCOUNTER — Encounter: Payer: Self-pay | Admitting: Physician Assistant

## 2018-02-19 VITALS — BP 100/50 | HR 60 | Ht 62.0 in | Wt 185.0 lb

## 2018-02-19 DIAGNOSIS — K7460 Unspecified cirrhosis of liver: Secondary | ICD-10-CM

## 2018-02-19 DIAGNOSIS — K3189 Other diseases of stomach and duodenum: Secondary | ICD-10-CM

## 2018-02-19 DIAGNOSIS — C220 Liver cell carcinoma: Secondary | ICD-10-CM

## 2018-02-19 DIAGNOSIS — D500 Iron deficiency anemia secondary to blood loss (chronic): Secondary | ICD-10-CM

## 2018-02-19 DIAGNOSIS — K766 Portal hypertension: Secondary | ICD-10-CM | POA: Diagnosis not present

## 2018-02-19 LAB — AMMONIA: Ammonia: 71 umol/L — ABNORMAL HIGH (ref 11–35)

## 2018-02-19 LAB — CBC WITH DIFFERENTIAL/PLATELET
BASOS PCT: 1.2 % (ref 0.0–3.0)
Basophils Absolute: 0.1 10*3/uL (ref 0.0–0.1)
EOS PCT: 3.4 % (ref 0.0–5.0)
Eosinophils Absolute: 0.2 10*3/uL (ref 0.0–0.7)
HCT: 24 % — ABNORMAL LOW (ref 36.0–46.0)
Hemoglobin: 8 g/dL — CL (ref 12.0–15.0)
LYMPHS ABS: 0.7 10*3/uL (ref 0.7–4.0)
Lymphocytes Relative: 13 % (ref 12.0–46.0)
MCHC: 33.2 g/dL (ref 30.0–36.0)
MCV: 104.4 fl — ABNORMAL HIGH (ref 78.0–100.0)
MONO ABS: 0.3 10*3/uL (ref 0.1–1.0)
Monocytes Relative: 5.4 % (ref 3.0–12.0)
NEUTROS PCT: 77 % (ref 43.0–77.0)
Neutro Abs: 4.4 10*3/uL (ref 1.4–7.7)
Platelets: 87 10*3/uL — ABNORMAL LOW (ref 150.0–400.0)
RBC: 2.3 Mil/uL — ABNORMAL LOW (ref 3.87–5.11)
RDW: 19.9 % — AB (ref 11.5–15.5)
WBC: 5.7 10*3/uL (ref 4.0–10.5)

## 2018-02-19 LAB — BASIC METABOLIC PANEL
BUN: 58 mg/dL — ABNORMAL HIGH (ref 6–23)
CHLORIDE: 94 meq/L — AB (ref 96–112)
CO2: 34 mEq/L — ABNORMAL HIGH (ref 19–32)
Calcium: 8.3 mg/dL — ABNORMAL LOW (ref 8.4–10.5)
Creatinine, Ser: 1.76 mg/dL — ABNORMAL HIGH (ref 0.40–1.20)
GFR: 30.23 mL/min — AB (ref >60.00)
GLUCOSE: 156 mg/dL — AB (ref 70–99)
POTASSIUM: 3.5 meq/L (ref 3.5–5.1)
Sodium: 134 mEq/L — ABNORMAL LOW (ref 135–145)

## 2018-02-19 NOTE — Progress Notes (Signed)
Subjective:    Patient ID: [name], female    DOB: 07/17/1946, 72 y.o.   MRN: [id number]  HPI [name] is a pleasant 72 year old white female, known to Dr. Havery Moros who comes in today for post hospital follow-up.  Patient has multiple serious comorbidities and was recently admitted with progressive weakness and anemia and heme positive stool.  Hemoglobin had drifted to the 7 range.  Her baseline hemoglobin is 8.5-9.5. She underwent transfusions and had EGD with Dr. Fuller Plan on 01/20/2018 with finding of grade 1 esophageal varices, and moderate portal gastropathy, normal duodenum.  Her ongoing slow GI blood loss is felt secondary to portal gastropathy. Hemoglobin was 7.5 on 01/21/2018 and was repeated on 01/27/2018 and up to 8.8.   LFTs normal at that time and creatinine stable at 1.2. Patient has been on chronic oral iron, her husband reports that her stools stayed dark but she has not had any overt melena or bright red blood since discharge from the hospital.  She says she is been feeling pretty well.  Her appetite is always good, she has no complaints of nausea, no abdominal discomfort.  She has gained about 7 pounds over the past month and has been reports that her Aldactone dose was doubled by Dr. Melford Aase  2 days ago.  Thus far weight has been stable but she has been urinating a lot. Other medical issues include advanced COPD, O2 dependent, congestive heart failure hypertension, chronic kidney disease stage III and decompensated cirrhosis secondary to [place].  This was complicated by hepatocellular CA stage II which she has had ablative therapy. Last MRI May 2019 showed a stable 6.4 x 5.6 cm ablation defect in the left liver and a stable smaller ablation defect in the right liver, no new lesions noted, small amount of ascites. Patient is maintained on Xifaxan and lactulose for hepatic encephalopathy and has been stable.  She is currently taking lactulose 30 cc once daily generally has 2-3 bowel  movements per day.  Review of Systems Pertinent positive and negative review of systems were noted in the above HPI section.  All other review of systems was otherwise negative.  Outpatient Encounter Medications as of 02/19/2018  Medication Sig  . albuterol (PROVENTIL HFA;VENTOLIN HFA) 108 (90 Base) MCG/ACT inhaler Inhale 2 puffs into the lungs every 6 (six) hours as needed for wheezing or shortness of breath.  . allopurinol (ZYLOPRIM) 300 MG tablet take 1 tablet (300 mg) by mouth daily with supper - for gout  . ALPRAZolam (XANAX) 0.25 MG tablet Take 1 tab as needed for anxiety, cramping, air hunger every 8 hours as needed.  . AMBULATORY NON FORMULARY MEDICATION METALOZONE  As needed depending on weight  . aspirin EC 81 MG tablet Take 81 mg by mouth at bedtime.  . bisoprolol (ZEBETA) 10 MG tablet Take 1 tablet (10 mg total) by mouth daily.  . Cholecalciferol (VITAMIN D3) 5000 units CAPS Take 5,000 Units by mouth daily with breakfast.  . citalopram (CELEXA) 20 MG tablet Take 1 tablet (20 mg total) by mouth daily with supper. (Patient taking differently: Take 20 mg by mouth 2 (two) times daily. )  . citalopram (CELEXA) 40 MG tablet TAKE ONE TABLET BY MOUTH TWICE DAILY  . cyclobenzaprine (FLEXERIL) 10 MG tablet Take 1 tablet (10 mg total) by mouth 3 (three) times daily as needed for muscle spasms.  . ferrous sulfate 325 (65 FE) MG tablet Take 325 mg by mouth 3 (three) times daily with meals.   Marland Kitchen  gabapentin (NEURONTIN) 600 MG tablet Take 600 mg by mouth 3 (three) times daily.   . Glycopyrrolate-Formoterol (BEVESPI AEROSPHERE) 9-4.8 MCG/ACT AERO Inhale 2 puffs into the lungs 2 (two) times daily.  Marland Kitchen lactulose (CHRONULAC) 10 GM/15ML solution take 24ms TWICE DAILY (Patient taking differently: Take 20 g by mouth daily. )  . Magnesium 250 MG TABS Take 250 mg by mouth at bedtime.   . ondansetron (ZOFRAN) 4 MG tablet Take 1 tablet (4 mg total) by mouth every 6 (six) hours as needed for nausea or vomiting.    . OXYGEN Inhale 2-3 L into the lungs continuous. 3 L with exertion  . potassium chloride (K-DUR) 10 MEQ tablet TAKE 2 TABLETS BY MOUTH 3 TIMES A DAY.  . rifampin (RIFADIN) 300 MG capsule Take 300 mg by mouth 2 (two) times daily.   .Marland Kitchenspironolactone (ALDACTONE) 25 MG tablet TAKE ONE TABLET BY MOUTH THREE TIMES A DAY. FOR FLUIDS AND SWELLING (Patient taking differently: TAKE  TWO TABLET BY MOUTH THREE TIMES A DAY. FOR FLUIDS AND SWELLING)  . torsemide (DEMADEX) 20 MG tablet Take 3 tablets 2 x/ day  Morning & Nite for fluid Retention & Swelling (Patient taking differently: Take 40 mg by mouth 2 (two) times daily. )  . traMADol (ULTRAM) 50 MG tablet Take 50 mg by mouth every 12 hours as needed for pain for chronic pain  . vitamin C (ASCORBIC ACID) 500 MG tablet Take 500 mg by mouth 3 (three) times daily.   No facility-administered encounter medications on file as of 02/19/2018.    Allergies  Allergen Reactions  . Atorvastatin Other (See Comments)    Unknown reaction  . Diphenhydramine Hcl (Sleep) Hives  . Hydrocodone-Acetaminophen Other (See Comments)    Unknown reaction  . Lopid [Gemfibrozil] Other (See Comments)    Unknown reaction  . Loratadine Hives  . Lorazepam Hives  . Simvastatin Other (See Comments)    Unknown reaction  . Sulfamethoxazole Hives  . Sulfonamide Derivatives Hives   Patient Active Problem List   Diagnosis Date Noted  . Iron deficiency anemia due to chronic blood loss   . Occult blood in stools   . Acute blood loss anemia 01/19/2018  . Chronic respiratory failure with hypoxia and hypercapnia ([place] 12/26/2017  . Obesity hypoventilation syndrome ([facility]   . Recurrent right pleural effusion   . NASH (nonalcoholic steatohepatitis)   . Liver cirrhosis secondary to nonalcoholic steatohepatitis (NASH) ([facility]   . Chronic diastolic CHF (congestive heart failure) ([facility]   . Macrocytic anemia   . CKD (chronic kidney disease), stage III ([place]   . Ascites 09/07/2017  .  Hepatocellular carcinoma ([facility] 08/30/2017  . Anasarca 12/14/2016  . Obesity (BMI 30-39.9) 10/12/2016  . Hyperammonemia ([facility] 10/12/2016  . Atrial tachycardia, multifocal (HL[place]   . Chronic respiratory failure ([facility] [place] 01/19/2016  . Hepatic encephalopathy ([facility] 09/21/2015  . Hypokalemia 09/21/2015  . COPD GOLD IV/ 02 dep  08/30/2015  . Thrombocytopenia ([place] 08/07/2015  . Venous insufficiency 07/26/2015  . Goals of care, counseling/discussion 12/14/2013  . Essential hypertension   . Hyperlipidemia   . GERD   . Vitamin D deficiency   . IBS   . Fibromyalgia   . Esophageal varices ([facility] 07/08/2012  . COLONIC POLYPS 11/11/2008  . Symptomatic anemia 05/28/2008  . Esophagitis 05/27/2008  . Diverticulosis of large intestine 05/27/2008   Social History   Socioeconomic History  . Marital status: Married    Spouse name: JJeneen Rinks . Number of children: 5  .  Years of education: Not on file  . Highest education level: Not on file  Occupational History  . Occupation: Arboriculturist: UNEMPLOYED  Social Needs  . Financial resource strain: Not on file  . Food insecurity:    Worry: Not on file    Inability: Not on file  . Transportation needs:    Medical: Not on file    Non-medical: Not on file  Tobacco Use  . Smoking status: Former Smoker    Packs/day: 1.00    Years: 50.00    Pack years: 50.00    Types: Cigarettes    Last attempt to quit: 11/05/2014    Years since quitting: 3.2  . Smokeless tobacco: Never Used  . Tobacco comment: Quit April 2016  Substance and Sexual Activity  . Alcohol use: No    Alcohol/week: 0.0 oz  . Drug use: No  . Sexual activity: Not on file  Lifestyle  . Physical activity:    Days per week: Not on file    Minutes per session: Not on file  . Stress: Not on file  Relationships  . Social connections:    Talks on phone: Not on file    Gets together: Not on file    Attends religious service: Not on file    Active member of club or organization: Not  on file    Attends meetings of clubs or organizations: Not on file    Relationship status: Not on file  . Intimate partner violence:    Fear of current or ex partner: Not on file    Emotionally abused: Not on file    Physically abused: Not on file    Forced sexual activity: Not on file  Other Topics Concern  . Not on file  Social History Narrative  . Not on file    Ms. Romain's family history includes Atrial fibrillation in her sister; Cancer in her father, maternal uncle, and other; Diabetes in her brother; Heart disease in her mother and sister.      Objective:    Vitals:   02/19/18 1403  BP: (!) 100/50  Pulse: 60    Physical Exam  Well-developed chronically ill appearing older white female in no acute distress, accompanied by her husband.  Patient is in a wheelchair and on chronic oxygen.  Most of the history is given by patient's husband.  Blood pressure 100/50 pulse 60 height 5 foot 2, weight 185, BMI 33.8.  HEENT ;nontraumatic normocephalic EOMI PERRLA sclera anicteric oropharynx benign, Cardiovascular; regular rate and rhythm with S1-S2 no murmur rub or gallop, Pulmonary; decreased breath sounds bilaterally, Abdomen; obese soft somewhat full appearing abdomen no definite fluid wave no palpable mass or hepatosplenomegaly, Rectal; exam not done, Extremities ;1+ edema in the ankles and legs up to the knee, Neuro psych ;alert and oriented, grossly nonfocal no asterixis       Assessment & Plan:   #66 72 year old female with acute on chronic anemia, history of chronic iron deficiency anemia and chronic GI blood loss felt secondary to portal gastropathy. Patient had recent hospitalization with progressive anemia and weakness.  EGD showed grade 1 esophageal varices and moderate portal gastropathy. #2.  Decompensated [name] cirrhosis-patient has had recent weight gain and Aldactone dosage was changed earlier this week by PCP. #3 hepatic encephalopathy-stable on Xifaxan and  lactulose #4 hepatocellular carcinoma stage II status post ablative therapy.  Last surveillance MRI a May 2019 stable #5 severe COPD/O2 dependent #6.  Chronic kidney disease stage  III  #7 hypertension # 8 debilitation  Plan; Continue current medication regimen without change.  Patient is currently on torsemide 20 mg 3 tablets twice daily, and Aldactone was increased from 25 mg 3 times daily to 50 mg 3 times daily earlier this week.  Continue bisoprolol 10 mg p.o. daily for portal hypertension  Repeat CBC and BMET today Patient should have serial hemoglobins done probably every 2 weeks over the next few months to assure stability.  If hemoglobin drifts to the 7 range she will benefit from outpatient transfusions. Dr. Melford Aase patient's PCP has been following her closely and managing most of her meds. Plan follow-up with Dr. Havery Moros in 3 months.    Shardae Kleinman Genia Harold PA-C 02/19/2018   Cc: Unk Pinto, MD

## 2018-02-19 NOTE — Patient Instructions (Addendum)
We will call you with the lab results.  Continue the same medications.  Please get your hemoglobin checked every 2-3 weeks by Dr. Melford Aase.   If you are age 71 or older, your body mass index should be between 23-30. Your Body mass index is 33.84 kg/m. If this is out of the aforementioned range listed, please consider follow up with your Primary Care Provider.

## 2018-02-20 ENCOUNTER — Other Ambulatory Visit: Payer: Self-pay | Admitting: Internal Medicine

## 2018-02-20 ENCOUNTER — Other Ambulatory Visit: Payer: Self-pay

## 2018-02-20 MED ORDER — METOLAZONE 5 MG PO TABS: ORAL_TABLET | ORAL | 0 refills | Status: DC

## 2018-02-20 NOTE — Progress Notes (Signed)
Agree with assessment and plan as outlined.  Agree with her regimen, but would add PPI if it is thought she is having blood loss from gastritis, if not on one already, would start omeprazole 92m once daily if no contraindications

## 2018-02-24 ENCOUNTER — Other Ambulatory Visit: Payer: Self-pay | Admitting: Adult Health

## 2018-02-24 DIAGNOSIS — G8929 Other chronic pain: Secondary | ICD-10-CM

## 2018-02-24 DIAGNOSIS — J441 Chronic obstructive pulmonary disease with (acute) exacerbation: Secondary | ICD-10-CM | POA: Diagnosis not present

## 2018-02-24 DIAGNOSIS — J9610 Chronic respiratory failure, unspecified whether with hypoxia or hypercapnia: Secondary | ICD-10-CM | POA: Diagnosis not present

## 2018-02-24 DIAGNOSIS — J449 Chronic obstructive pulmonary disease, unspecified: Secondary | ICD-10-CM | POA: Diagnosis not present

## 2018-02-25 ENCOUNTER — Other Ambulatory Visit: Payer: Self-pay | Admitting: Internal Medicine

## 2018-02-25 ENCOUNTER — Emergency Department (HOSPITAL_COMMUNITY): Payer: PPO

## 2018-02-25 ENCOUNTER — Encounter (HOSPITAL_COMMUNITY): Payer: Self-pay | Admitting: Emergency Medicine

## 2018-02-25 ENCOUNTER — Inpatient Hospital Stay (HOSPITAL_COMMUNITY)
Admission: EM | Admit: 2018-02-25 | Discharge: 2018-03-01 | DRG: 433 | Disposition: A | Discharge status: Home Health | Payer: PPO | Attending: Internal Medicine | Admitting: Internal Medicine

## 2018-02-25 ENCOUNTER — Other Ambulatory Visit: Payer: Self-pay

## 2018-02-25 DIAGNOSIS — Z96611 Presence of right artificial shoulder joint: Secondary | ICD-10-CM | POA: Diagnosis present

## 2018-02-25 DIAGNOSIS — M7989 Other specified soft tissue disorders: Secondary | ICD-10-CM | POA: Diagnosis present

## 2018-02-25 DIAGNOSIS — Z885 Allergy status to narcotic agent status: Secondary | ICD-10-CM | POA: Diagnosis not present

## 2018-02-25 DIAGNOSIS — K7581 Nonalcoholic steatohepatitis (NASH): Secondary | ICD-10-CM | POA: Diagnosis not present

## 2018-02-25 DIAGNOSIS — Z6834 Body mass index (BMI) 34.0-34.9, adult: Secondary | ICD-10-CM

## 2018-02-25 DIAGNOSIS — R278 Other lack of coordination: Secondary | ICD-10-CM | POA: Diagnosis present

## 2018-02-25 DIAGNOSIS — Z882 Allergy status to sulfonamides status: Secondary | ICD-10-CM | POA: Diagnosis not present

## 2018-02-25 DIAGNOSIS — Z9842 Cataract extraction status, left eye: Secondary | ICD-10-CM

## 2018-02-25 DIAGNOSIS — D539 Nutritional anemia, unspecified: Secondary | ICD-10-CM | POA: Diagnosis present

## 2018-02-25 DIAGNOSIS — K7460 Unspecified cirrhosis of liver: Secondary | ICD-10-CM | POA: Diagnosis not present

## 2018-02-25 DIAGNOSIS — Z96643 Presence of artificial hip joint, bilateral: Secondary | ICD-10-CM | POA: Diagnosis present

## 2018-02-25 DIAGNOSIS — M19042 Primary osteoarthritis, left hand: Secondary | ICD-10-CM | POA: Diagnosis present

## 2018-02-25 DIAGNOSIS — R609 Edema, unspecified: Secondary | ICD-10-CM | POA: Diagnosis not present

## 2018-02-25 DIAGNOSIS — F329 Major depressive disorder, single episode, unspecified: Secondary | ICD-10-CM | POA: Diagnosis not present

## 2018-02-25 DIAGNOSIS — K7290 Hepatic failure, unspecified without coma: Secondary | ICD-10-CM | POA: Diagnosis not present

## 2018-02-25 DIAGNOSIS — Z9889 Other specified postprocedural states: Secondary | ICD-10-CM | POA: Diagnosis not present

## 2018-02-25 DIAGNOSIS — M19041 Primary osteoarthritis, right hand: Secondary | ICD-10-CM | POA: Diagnosis present

## 2018-02-25 DIAGNOSIS — Z8249 Family history of ischemic heart disease and other diseases of the circulatory system: Secondary | ICD-10-CM

## 2018-02-25 DIAGNOSIS — Z79899 Other long term (current) drug therapy: Secondary | ICD-10-CM

## 2018-02-25 DIAGNOSIS — K7031 Alcoholic cirrhosis of liver with ascites: Secondary | ICD-10-CM

## 2018-02-25 DIAGNOSIS — N179 Acute kidney failure, unspecified: Secondary | ICD-10-CM | POA: Diagnosis not present

## 2018-02-25 DIAGNOSIS — R627 Adult failure to thrive: Secondary | ICD-10-CM | POA: Diagnosis present

## 2018-02-25 DIAGNOSIS — Z8505 Personal history of malignant neoplasm of liver: Secondary | ICD-10-CM

## 2018-02-25 DIAGNOSIS — I8500 Esophageal varices without bleeding: Secondary | ICD-10-CM | POA: Diagnosis present

## 2018-02-25 DIAGNOSIS — R188 Other ascites: Secondary | ICD-10-CM | POA: Diagnosis not present

## 2018-02-25 DIAGNOSIS — B952 Enterococcus as the cause of diseases classified elsewhere: Secondary | ICD-10-CM | POA: Diagnosis present

## 2018-02-25 DIAGNOSIS — R251 Tremor, unspecified: Secondary | ICD-10-CM | POA: Diagnosis present

## 2018-02-25 DIAGNOSIS — Z888 Allergy status to other drugs, medicaments and biological substances status: Secondary | ICD-10-CM

## 2018-02-25 DIAGNOSIS — Z86010 Personal history of colonic polyps: Secondary | ICD-10-CM

## 2018-02-25 DIAGNOSIS — M542 Cervicalgia: Secondary | ICD-10-CM | POA: Diagnosis not present

## 2018-02-25 DIAGNOSIS — E1122 Type 2 diabetes mellitus with diabetic chronic kidney disease: Secondary | ICD-10-CM | POA: Diagnosis not present

## 2018-02-25 DIAGNOSIS — Z515 Encounter for palliative care: Secondary | ICD-10-CM | POA: Diagnosis not present

## 2018-02-25 DIAGNOSIS — J449 Chronic obstructive pulmonary disease, unspecified: Secondary | ICD-10-CM | POA: Diagnosis present

## 2018-02-25 DIAGNOSIS — I130 Hypertensive heart and chronic kidney disease with heart failure and stage 1 through stage 4 chronic kidney disease, or unspecified chronic kidney disease: Secondary | ICD-10-CM | POA: Diagnosis present

## 2018-02-25 DIAGNOSIS — N183 Chronic kidney disease, stage 3 unspecified: Secondary | ICD-10-CM | POA: Diagnosis present

## 2018-02-25 DIAGNOSIS — M4322 Fusion of spine, cervical region: Secondary | ICD-10-CM | POA: Diagnosis not present

## 2018-02-25 DIAGNOSIS — Z66 Do not resuscitate: Secondary | ICD-10-CM | POA: Diagnosis present

## 2018-02-25 DIAGNOSIS — R001 Bradycardia, unspecified: Secondary | ICD-10-CM | POA: Diagnosis present

## 2018-02-25 DIAGNOSIS — D500 Iron deficiency anemia secondary to blood loss (chronic): Secondary | ICD-10-CM | POA: Diagnosis present

## 2018-02-25 DIAGNOSIS — Z79891 Long term (current) use of opiate analgesic: Secondary | ICD-10-CM

## 2018-02-25 DIAGNOSIS — Z7189 Other specified counseling: Secondary | ICD-10-CM | POA: Diagnosis not present

## 2018-02-25 DIAGNOSIS — D631 Anemia in chronic kidney disease: Secondary | ICD-10-CM | POA: Diagnosis not present

## 2018-02-25 DIAGNOSIS — I5032 Chronic diastolic (congestive) heart failure: Secondary | ICD-10-CM | POA: Diagnosis not present

## 2018-02-25 DIAGNOSIS — Z8672 Personal history of thrombophlebitis: Secondary | ICD-10-CM

## 2018-02-25 DIAGNOSIS — L89151 Pressure ulcer of sacral region, stage 1: Secondary | ICD-10-CM | POA: Diagnosis not present

## 2018-02-25 DIAGNOSIS — K3189 Other diseases of stomach and duodenum: Secondary | ICD-10-CM | POA: Diagnosis present

## 2018-02-25 DIAGNOSIS — M25511 Pain in right shoulder: Secondary | ICD-10-CM | POA: Diagnosis not present

## 2018-02-25 DIAGNOSIS — R0602 Shortness of breath: Secondary | ICD-10-CM | POA: Diagnosis not present

## 2018-02-25 DIAGNOSIS — Z7982 Long term (current) use of aspirin: Secondary | ICD-10-CM

## 2018-02-25 DIAGNOSIS — I10 Essential (primary) hypertension: Secondary | ICD-10-CM | POA: Diagnosis present

## 2018-02-25 DIAGNOSIS — Z9981 Dependence on supplemental oxygen: Secondary | ICD-10-CM

## 2018-02-25 DIAGNOSIS — I509 Heart failure, unspecified: Secondary | ICD-10-CM

## 2018-02-25 DIAGNOSIS — L8990 Pressure ulcer of unspecified site, unspecified stage: Secondary | ICD-10-CM

## 2018-02-25 DIAGNOSIS — Z9841 Cataract extraction status, right eye: Secondary | ICD-10-CM

## 2018-02-25 DIAGNOSIS — R180 Malignant ascites: Secondary | ICD-10-CM

## 2018-02-25 DIAGNOSIS — I110 Hypertensive heart disease with heart failure: Secondary | ICD-10-CM | POA: Diagnosis not present

## 2018-02-25 DIAGNOSIS — D649 Anemia, unspecified: Secondary | ICD-10-CM | POA: Diagnosis present

## 2018-02-25 DIAGNOSIS — J9610 Chronic respiratory failure, unspecified whether with hypoxia or hypercapnia: Secondary | ICD-10-CM | POA: Diagnosis not present

## 2018-02-25 DIAGNOSIS — I4510 Unspecified right bundle-branch block: Secondary | ICD-10-CM | POA: Diagnosis present

## 2018-02-25 DIAGNOSIS — Z90710 Acquired absence of both cervix and uterus: Secondary | ICD-10-CM

## 2018-02-25 DIAGNOSIS — Z9049 Acquired absence of other specified parts of digestive tract: Secondary | ICD-10-CM

## 2018-02-25 DIAGNOSIS — Z87891 Personal history of nicotine dependence: Secondary | ICD-10-CM

## 2018-02-25 DIAGNOSIS — K219 Gastro-esophageal reflux disease without esophagitis: Secondary | ICD-10-CM | POA: Diagnosis present

## 2018-02-25 DIAGNOSIS — E876 Hypokalemia: Secondary | ICD-10-CM | POA: Diagnosis not present

## 2018-02-25 DIAGNOSIS — E669 Obesity, unspecified: Secondary | ICD-10-CM | POA: Diagnosis present

## 2018-02-25 DIAGNOSIS — K589 Irritable bowel syndrome without diarrhea: Secondary | ICD-10-CM | POA: Diagnosis present

## 2018-02-25 DIAGNOSIS — Z833 Family history of diabetes mellitus: Secondary | ICD-10-CM

## 2018-02-25 DIAGNOSIS — E559 Vitamin D deficiency, unspecified: Secondary | ICD-10-CM | POA: Diagnosis present

## 2018-02-25 DIAGNOSIS — D6959 Other secondary thrombocytopenia: Secondary | ICD-10-CM | POA: Diagnosis present

## 2018-02-25 DIAGNOSIS — M797 Fibromyalgia: Secondary | ICD-10-CM | POA: Diagnosis present

## 2018-02-25 DIAGNOSIS — Z801 Family history of malignant neoplasm of trachea, bronchus and lung: Secondary | ICD-10-CM

## 2018-02-25 DIAGNOSIS — E785 Hyperlipidemia, unspecified: Secondary | ICD-10-CM | POA: Diagnosis present

## 2018-02-25 LAB — BASIC METABOLIC PANEL WITH GFR
Anion gap: 11 (ref 5–15)
BUN: 68 mg/dL — ABNORMAL HIGH (ref 8–23)
CO2: 31 mmol/L (ref 22–32)
Calcium: 8.5 mg/dL — ABNORMAL LOW (ref 8.9–10.3)
Chloride: 91 mmol/L — ABNORMAL LOW (ref 98–111)
Creatinine, Ser: 2.21 mg/dL — ABNORMAL HIGH (ref 0.44–1.00)
GFR calc Af Amer: 25 mL/min — ABNORMAL LOW
GFR calc non Af Amer: 21 mL/min — ABNORMAL LOW
Glucose, Bld: 137 mg/dL — ABNORMAL HIGH (ref 70–99)
Potassium: 3.6 mmol/L (ref 3.5–5.1)
Sodium: 133 mmol/L — ABNORMAL LOW (ref 135–145)

## 2018-02-25 LAB — HEPATIC FUNCTION PANEL
ALT: 20 U/L (ref 0–44)
AST: 27 U/L (ref 15–41)
Albumin: 2.5 g/dL — ABNORMAL LOW (ref 3.5–5.0)
Alkaline Phosphatase: 137 U/L — ABNORMAL HIGH (ref 38–126)
Bilirubin, Direct: 0.4 mg/dL — ABNORMAL HIGH (ref 0.0–0.2)
Indirect Bilirubin: 1 mg/dL — ABNORMAL HIGH (ref 0.3–0.9)
Total Bilirubin: 1.4 mg/dL — ABNORMAL HIGH (ref 0.3–1.2)
Total Protein: 4.8 g/dL — ABNORMAL LOW (ref 6.5–8.1)

## 2018-02-25 LAB — URINALYSIS, ROUTINE W REFLEX MICROSCOPIC
Bilirubin Urine: NEGATIVE
Glucose, UA: NEGATIVE mg/dL
Hgb urine dipstick: NEGATIVE
Ketones, ur: NEGATIVE mg/dL
Nitrite: NEGATIVE
Protein, ur: NEGATIVE mg/dL
Specific Gravity, Urine: 1.008 (ref 1.005–1.030)
pH: 6 (ref 5.0–8.0)

## 2018-02-25 LAB — CBC
HCT: 24 % — ABNORMAL LOW (ref 36.0–46.0)
Hemoglobin: 7.5 g/dL — ABNORMAL LOW (ref 12.0–15.0)
MCH: 34.9 pg — ABNORMAL HIGH (ref 26.0–34.0)
MCHC: 31.3 g/dL (ref 30.0–36.0)
MCV: 111.6 fL — ABNORMAL HIGH (ref 78.0–100.0)
Platelets: 92 10*3/uL — ABNORMAL LOW (ref 150–400)
RBC: 2.15 MIL/uL — ABNORMAL LOW (ref 3.87–5.11)
RDW: 18.7 % — ABNORMAL HIGH (ref 11.5–15.5)
WBC: 5.9 10*3/uL (ref 4.0–10.5)

## 2018-02-25 LAB — AMMONIA: Ammonia: 46 umol/L — ABNORMAL HIGH (ref 9–35)

## 2018-02-25 LAB — PREPARE RBC (CROSSMATCH)

## 2018-02-25 LAB — PROTIME-INR
INR: 1.14
Prothrombin Time: 14.5 s (ref 11.4–15.2)

## 2018-02-25 LAB — CBG MONITORING, ED: Glucose-Capillary: 119 mg/dL — ABNORMAL HIGH (ref 70–99)

## 2018-02-25 LAB — BRAIN NATRIURETIC PEPTIDE: B Natriuretic Peptide: 283.7 pg/mL — ABNORMAL HIGH (ref 0.0–100.0)

## 2018-02-25 MED ORDER — CITALOPRAM HYDROBROMIDE 40 MG PO TABS
40.0000 mg | ORAL_TABLET | Freq: Two times a day (BID) | ORAL | Status: DC
  Administered 2018-02-26: 40 mg via ORAL
  Filled 2018-02-25 (×3): qty 1

## 2018-02-25 MED ORDER — SODIUM CHLORIDE 0.9 % IV SOLN
250.0000 mL | INTRAVENOUS | Status: DC | PRN
  Administered 2018-02-27: 250 mL via INTRAVENOUS

## 2018-02-25 MED ORDER — FENTANYL CITRATE (PF) 100 MCG/2ML IJ SOLN
50.0000 ug | Freq: Once | INTRAMUSCULAR | Status: AC
  Administered 2018-02-25: 50 ug via INTRAMUSCULAR
  Filled 2018-02-25: qty 2

## 2018-02-25 MED ORDER — SODIUM CHLORIDE 0.9% FLUSH: 3.0000 mL | INTRAVENOUS | Status: DC | PRN

## 2018-02-25 MED ORDER — CYCLOBENZAPRINE HCL 10 MG PO TABS: 10.0000 mg | ORAL_TABLET | Freq: Three times a day (TID) | ORAL | Status: DC | PRN

## 2018-02-25 MED ORDER — FERROUS SULFATE 325 (65 FE) MG PO TABS
325.0000 mg | ORAL_TABLET | Freq: Three times a day (TID) | ORAL | Status: DC
  Administered 2018-02-26 – 2018-02-27 (×3): 325 mg via ORAL
  Filled 2018-02-25 (×3): qty 1

## 2018-02-25 MED ORDER — GABAPENTIN 300 MG PO CAPS
600.0000 mg | ORAL_CAPSULE | Freq: Three times a day (TID) | ORAL | Status: DC
  Administered 2018-02-26 – 2018-03-01 (×11): 600 mg via ORAL
  Filled 2018-02-25 (×11): qty 2

## 2018-02-25 MED ORDER — METOLAZONE 5 MG PO TABS
5.0000 mg | ORAL_TABLET | Freq: Every day | ORAL | Status: DC
  Administered 2018-02-26 – 2018-03-01 (×4): 5 mg via ORAL
  Filled 2018-02-25 (×4): qty 1

## 2018-02-25 MED ORDER — MAGNESIUM OXIDE 400 (241.3 MG) MG PO TABS
400.0000 mg | ORAL_TABLET | Freq: Every day | ORAL | Status: DC
Start: 2018-02-25 — End: 2018-03-01
  Administered 2018-02-25 – 2018-02-28 (×4): 400 mg via ORAL
  Filled 2018-02-25 (×4): qty 1

## 2018-02-25 MED ORDER — RIFAMPIN 300 MG PO CAPS
300.0000 mg | ORAL_CAPSULE | Freq: Two times a day (BID) | ORAL | Status: DC
  Administered 2018-02-26: 300 mg via ORAL
  Filled 2018-02-25 (×3): qty 1

## 2018-02-25 MED ORDER — ONDANSETRON HCL 4 MG/2ML IJ SOLN
4.0000 mg | Freq: Four times a day (QID) | INTRAMUSCULAR | Status: DC | PRN
  Administered 2018-02-28: 4 mg via INTRAVENOUS
  Filled 2018-02-25: qty 2

## 2018-02-25 MED ORDER — BISOPROLOL FUMARATE 10 MG PO TABS
10.0000 mg | ORAL_TABLET | Freq: Every day | ORAL | Status: DC
  Administered 2018-02-26 – 2018-02-27 (×2): 10 mg via ORAL
  Filled 2018-02-25 (×2): qty 1

## 2018-02-25 MED ORDER — SODIUM CHLORIDE 0.9% IV SOLUTION: Freq: Once | INTRAVENOUS | Status: DC

## 2018-02-25 MED ORDER — VITAMIN C 500 MG PO TABS
500.0000 mg | ORAL_TABLET | Freq: Three times a day (TID) | ORAL | Status: DC
  Administered 2018-02-26 – 2018-03-01 (×11): 500 mg via ORAL
  Filled 2018-02-25 (×11): qty 1

## 2018-02-25 MED ORDER — SODIUM CHLORIDE 0.9% FLUSH
3.0000 mL | Freq: Two times a day (BID) | INTRAVENOUS | Status: DC
  Administered 2018-02-26 – 2018-02-28 (×6): 3 mL via INTRAVENOUS

## 2018-02-25 MED ORDER — ONDANSETRON HCL 4 MG PO TABS: 4.0000 mg | ORAL_TABLET | Freq: Four times a day (QID) | ORAL | Status: DC | PRN

## 2018-02-25 MED ORDER — IPRATROPIUM BROMIDE 0.02 % IN SOLN: 3.0000 mL | RESPIRATORY_TRACT | Status: DC | PRN

## 2018-02-25 MED ORDER — ALBUTEROL SULFATE (2.5 MG/3ML) 0.083% IN NEBU: 2.5000 mg | INHALATION_SOLUTION | Freq: Four times a day (QID) | RESPIRATORY_TRACT | Status: DC | PRN

## 2018-02-25 MED ORDER — ASPIRIN EC 81 MG PO TBEC
81.0000 mg | DELAYED_RELEASE_TABLET | Freq: Every day | ORAL | Status: DC
  Administered 2018-02-25 – 2018-02-28 (×4): 81 mg via ORAL
  Filled 2018-02-25 (×4): qty 1

## 2018-02-25 MED ORDER — VITAMIN D 1000 UNITS PO TABS
5000.0000 [IU] | ORAL_TABLET | Freq: Every day | ORAL | Status: DC
  Administered 2018-02-26 – 2018-03-01 (×4): 5000 [IU] via ORAL
  Filled 2018-02-25 (×5): qty 5

## 2018-02-25 NOTE — ED Notes (Addendum)
This RN asked to come in to room to reassess.  Patient c/o sudden onset of left flank pain.  Squinting eyes tightly, states now having headache.  Alyse Low made aware, PA who quick-looked patient.  Fentanyl ordered

## 2018-02-25 NOTE — ED Provider Notes (Signed)
[facility] EMERGENCY DEPARTMENT Provider Note   CSN: [id number] Arrival date & time: 02/25/18  1506     History   Chief Complaint Chief Complaint  Patient presents with  . Hepatic Disease    fluid retention  . Weakness  . Urinary Retention    HPI [name] is a 72 y.o. female.  The history is provided by the patient and medical records. No language interpreter was used.  Shortness of Breath  This is a new problem. The average episode lasts 1 week. The problem occurs continuously.The problem has been gradually worsening. Associated symptoms include cough, abdominal pain and leg swelling. Pertinent negatives include no fever, no headaches, no rhinorrhea, no sore throat, no neck pain, no sputum production, no wheezing, no orthopnea, no chest pain, no syncope, no vomiting and no rash. The treatment provided no relief. She has had prior hospitalizations. Associated medical issues include COPD, chronic lung disease and heart failure.    Past Medical History:  Diagnosis Date  . Asthma   . Cancer [facility])    liver cancer   . COPD (chronic obstructive pulmonary disease) ([place])    x 3 years   . Depression   . Diabetes mellitus without complication ([place])    diet controlled  not on meds   . Edema    left leg   . Esophageal varices ([place])   . Fibromyalgia   . Gastritis   . GERD (gastroesophageal reflux disease)   . Hepatic cirrhosis ([place])   . Hepatic encephalopathy ([place])   . History of blood transfusion   . Hyperlipidemia   . Hypertension   . IBS (irritable bowel syndrome)   . Phlebitis    left leg x 2   . Pneumonia    hx of   . Splenomegaly   . Vitamin D deficiency     Patient Active Problem List   Diagnosis Date Noted  . Iron deficiency anemia due to chronic blood loss   . Occult blood in stools   . Acute blood loss anemia 01/19/2018  . Chronic respiratory failure with hypoxia and hypercapnia ([place]) 12/26/2017  . Obesity hypoventilation syndrome  ([place])   . Recurrent right pleural effusion   . NASH (nonalcoholic steatohepatitis)   . Liver cirrhosis secondary to nonalcoholic steatohepatitis (NASH) ([place])   . Chronic diastolic CHF (congestive heart failure) ([place])   . Macrocytic anemia   . CKD (chronic kidney disease), stage III ([place])   . Ascites 09/07/2017  . Hepatocellular carcinoma ([place]) 08/30/2017  . Anasarca 12/14/2016  . Obesity (BMI 30-39.9) 10/12/2016  . Hyperammonemia ([place]) 10/12/2016  . Atrial tachycardia, multifocal ([place])   . Chronic respiratory failure ([place]) 01/19/2016  . Hepatic encephalopathy ([place]) 09/21/2015  . Hypokalemia 09/21/2015  . COPD GOLD IV/ 02 dep  08/30/2015  . Thrombocytopenia ([place]) 08/07/2015  . Venous insufficiency 07/26/2015  . Goals of care, counseling/discussion 12/14/2013  . Essential hypertension   . Hyperlipidemia   . GERD   . Vitamin D deficiency   . IBS   . Fibromyalgia   . Esophageal varices ([place]) 07/08/2012  . COLONIC POLYPS 11/11/2008  . Symptomatic anemia 05/28/2008  . Esophagitis 05/27/2008  . Diverticulosis of large intestine 05/27/2008    Past Surgical History:  Procedure Laterality Date  . ABDOMINAL HYSTERECTOMY    . BACK SURGERY    . CATARACT EXTRACTION, BILATERAL    . CHOLECYSTECTOMY    . ESOPHAGOGASTRODUODENOSCOPY  07/16/2012   Procedure: ESOPHAGOGASTRODUODENOSCOPY (EGD);  Surgeon: [name]  Deatra Ina, MD;  Location: [facility];  Service: Endoscopy;  Laterality: N/A;  . ESOPHAGOGASTRODUODENOSCOPY N/A 01/20/2018   Procedure: ESOPHAGOGASTRODUODENOSCOPY (EGD);  Surgeon: Ladene Artist, MD;  Location: [facility] ENDOSCOPY;  Service: Endoscopy;  Laterality: N/A;  . GASTRIC VARICES BANDING  07/16/2012   Procedure: GASTRIC VARICES BANDING;  Surgeon: Inda Castle, MD;  Location: [facility] ENDOSCOPY;  Service: Endoscopy;  Laterality: N/A;  . HIP ARTHROPLASTY Bilateral   . IR GENERIC HISTORICAL  06/25/2016   IR RADIOLOGIST EVAL & MGMT 06/25/2016 [facility]  . IR GENERIC HISTORICAL   06/27/2016   IR KYPHO LUMBAR INC FX REDUCE BONE BX UNI/BIL CANNULATION INC/IMAGING 06/27/2016 Luanne Bras, MD [facility]  . IR GENERIC HISTORICAL  07/17/2016   IR RADIOLOGIST EVAL & MGMT 07/17/2016 [facility]  . IR PARACENTESIS  01/20/2018  . IR RADIOLOGIST EVAL & MGMT  07/10/2017  . IR RADIOLOGIST EVAL & MGMT  10/01/2017  . IR RADIOLOGIST EVAL & MGMT  12/11/2017  . IR THORACENTESIS ASP PLEURAL SPACE W/IMG GUIDE  09/12/2017  . IR THORACENTESIS ASP PLEURAL SPACE W/IMG GUIDE  09/17/2017  . NECK SURGERY    . RADIOFREQUENCY ABLATION N/A 08/30/2017   Procedure: CT MICROWAVE THERMAL ABLATION;  Surgeon: Arne Cleveland, MD;  Location: [facility] ORS;  Service: Anesthesiology;  Laterality: N/A;  . SHOULDER SURGERY Right      OB History   None      Home Medications    Prior to Admission medications   Medication Sig Start Date End Date Taking? Authorizing Provider  albuterol (PROVENTIL HFA;VENTOLIN HFA) 108 (90 Base) MCG/ACT inhaler Inhale 2 puffs into the lungs every 6 (six) hours as needed for wheezing or shortness of breath. 05/27/16   Ward, Delice Bison, DO  allopurinol (ZYLOPRIM) 300 MG tablet take 1 tablet (300 mg) by mouth daily with supper - for gout 09/28/17   Allie Bossier, MD  ALPRAZolam Duanne Moron) 0.25 MG tablet Take 1 tab as needed for anxiety, cramping, air hunger every 8 hours as needed. 11/01/17   Liane Comber, NP  AMBULATORY NON [place]  As needed depending on weight    [provider]  aspirin EC 81 MG tablet Take 81 mg by mouth at bedtime.    [provider]  bisoprolol (ZEBETA) 10 MG tablet Take 1 tablet (10 mg total) by mouth daily. 10/17/17   Unk Pinto, MD  Cholecalciferol (VITAMIN D3) 5000 units CAPS Take 5,000 Units by mouth daily with breakfast.    [provider]  citalopram (CELEXA) 40 MG tablet TAKE ONE TABLET BY MOUTH TWICE DAILY 01/28/18   Unk Pinto, MD  cyclobenzaprine (FLEXERIL) 10 MG tablet Take 1 tablet  (10 mg total) by mouth 3 (three) times daily as needed for muscle spasms. 03/06/16   Unk Pinto, MD  ferrous sulfate 325 (65 FE) MG tablet Take 325 mg by mouth 3 (three) times daily with meals.     [provider]  gabapentin (NEURONTIN) 600 MG tablet Take 600 mg by mouth 3 (three) times daily.     [provider]  Glycopyrrolate-Formoterol (BEVESPI AEROSPHERE) 9-4.8 MCG/ACT AERO Inhale 2 puffs into the lungs 2 (two) times daily. 12/25/17   Tanda Rockers, MD  lactulose (CHRONULAC) 10 GM/15ML solution take 19ms TWICE DAILY Patient taking differently: Take 20 g by mouth daily.  08/08/17   MUnk Pinto MD  Magnesium 250 MG TABS Take 250 mg by mouth at bedtime.     [provider]  metolazone (ZAROXOLYN) 5  MG tablet Take 1 tablet daily or as directed for fluid Overload 02/20/18   Unk Pinto, MD  ondansetron (ZOFRAN) 4 MG tablet Take 1 tablet (4 mg total) by mouth every 6 (six) hours as needed for nausea or vomiting. 09/02/17   Vicie Mutters, PA-C  OXYGEN Inhale 2-3 L into the lungs continuous. 3 L with exertion    [provider]  potassium chloride (K-DUR) 10 MEQ tablet TAKE 2 TABLETS BY MOUTH 3 TIMES A DAY. 02/11/18   Unk Pinto, MD  rifampin (RIFADIN) 300 MG capsule Take 300 mg by mouth 2 (two) times daily.     [provider]  spironolactone (ALDACTONE) 25 MG tablet TAKE ONE TABLET BY MOUTH THREE TIMES A DAY. FOR FLUIDS AND SWELLING Patient taking differently: TAKE  TWO TABLET BY MOUTH THREE TIMES A DAY. FOR FLUIDS AND SWELLING 02/11/18   Unk Pinto, MD  torsemide (DEMADEX) 20 MG tablet Take 3 tablets 2 x/ day  Morning & Nite for fluid Retention & Swelling Patient taking differently: Take 40 mg by mouth 2 (two) times daily.  11/25/17   Unk Pinto, MD  traMADol Veatrice Bourbon) 50 MG tablet Take 50 mg by mouth every 12 hours as needed for pain for chronic pain 11/18/17   Vicie Mutters, PA-C  vitamin C (ASCORBIC ACID) 500 MG tablet Take  500 mg by mouth 3 (three) times daily.    [provider]    Family History Family History  Problem Relation Age of Onset  . Diabetes Brother        deceased  . Heart disease Sister        A Fib  . Heart disease Mother        CHF  . Atrial fibrillation Sister   . Cancer Father        lung cancer   . Cancer Maternal Uncle        unknown type cancer   . Cancer Other        lung cancer   . Colon cancer Neg Hx     Social History Social History   Tobacco Use  . Smoking status: Former Smoker    Packs/day: 1.00    Years: 50.00    Pack years: 50.00    Types: Cigarettes    Last attempt to quit: 11/05/2014    Years since quitting: 3.3  . Smokeless tobacco: Never Used  . Tobacco comment: Quit April 2016  Substance Use Topics  . Alcohol use: No    Alcohol/week: 0.0 oz  . Drug use: No     Allergies   Atorvastatin; Diphenhydramine hcl (sleep); Hydrocodone-acetaminophen; Lopid [gemfibrozil]; Loratadine; Lorazepam; Simvastatin; Sulfamethoxazole; and Sulfonamide derivatives   Review of Systems Review of Systems  Constitutional: Positive for fatigue. Negative for chills, diaphoresis and fever.  HENT: Negative for congestion, rhinorrhea and sore throat.   Eyes: Negative for visual disturbance.  Respiratory: Positive for cough, chest tightness and shortness of breath. Negative for sputum production, wheezing and stridor.   Cardiovascular: Positive for leg swelling. Negative for chest pain, palpitations, orthopnea and syncope.  Gastrointestinal: Positive for abdominal distention, abdominal pain and nausea. Negative for constipation, diarrhea and vomiting.  Genitourinary: Negative for dysuria, flank pain and frequency.  Musculoskeletal: Negative for back pain, neck pain and neck stiffness.  Skin: Negative for rash and wound.  Neurological: Negative for weakness, light-headedness and headaches.  Psychiatric/Behavioral: Negative for agitation and confusion.  All other  systems reviewed and are negative.    Physical Exam  Updated Vital Signs BP (!) 116/52 (BP Location: Left Arm)   Pulse 62   Temp 98 F (36.7 C) (Oral)   Resp 18   SpO2 100%   Physical Exam  Constitutional: She is oriented to person, place, and time. She appears well-developed and well-nourished. No distress.  HENT:  Head: Normocephalic and atraumatic.  Mouth/Throat: Oropharynx is clear and moist. No oropharyngeal exudate.  Eyes: Pupils are equal, round, and reactive to light. Conjunctivae and EOM are normal.  Neck: Neck supple.  Cardiovascular: Normal rate and regular rhythm.  No murmur heard. Pulmonary/Chest: Effort normal. No respiratory distress. She has no wheezes. She has rales. She exhibits no tenderness.  Abdominal: Soft. She exhibits distension. There is tenderness.  Musculoskeletal: She exhibits edema. She exhibits no tenderness.  Lymphadenopathy:    She has no cervical adenopathy.  Neurological: She is alert and oriented to person, place, and time. No cranial nerve deficit or sensory deficit. She exhibits normal muscle tone.  Skin: Skin is warm and dry. Capillary refill takes less than 2 seconds. No rash noted. She is not diaphoretic. No erythema.  Psychiatric: She has a normal mood and affect.  Nursing note and vitals reviewed.    ED Treatments / Results  Labs (all labs ordered are listed, but only abnormal results are displayed) Labs Reviewed  HEPATIC FUNCTION PANEL - Abnormal; Notable for the following components:      Result Value   Total Protein 4.8 (*)    Albumin 2.5 (*)    Alkaline Phosphatase 137 (*)    Total Bilirubin 1.4 (*)    Bilirubin, Direct 0.4 (*)    Indirect Bilirubin 1.0 (*)    All other components within normal limits  BASIC METABOLIC PANEL - Abnormal; Notable for the following components:   Sodium 133 (*)    Chloride 91 (*)    Glucose, Bld 137 (*)    BUN 68 (*)    Creatinine, Ser 2.21 (*)    Calcium 8.5 (*)    GFR calc non Af Amer 21  (*)    GFR calc Af Amer 25 (*)    All other components within normal limits  CBC - Abnormal; Notable for the following components:   RBC 2.15 (*)    Hemoglobin 7.5 (*)    HCT 24.0 (*)    MCV 111.6 (*)    MCH 34.9 (*)    RDW 18.7 (*)    Platelets 92 (*)    All other components within normal limits  URINALYSIS, ROUTINE W REFLEX MICROSCOPIC - Abnormal; Notable for the following components:   Leukocytes, UA TRACE (*)    Bacteria, UA RARE (*)    All other components within normal limits  AMMONIA - Abnormal; Notable for the following components:   Ammonia 46 (*)    All other components within normal limits  BRAIN NATRIURETIC PEPTIDE - Abnormal; Notable for the following components:   B Natriuretic Peptide 283.7 (*)    All other components within normal limits  CBG MONITORING, ED - Abnormal; Notable for the following components:   Glucose-Capillary 119 (*)    All other components within normal limits  URINE CULTURE  PROTIME-INR  BASIC METABOLIC PANEL  CBC  TYPE AND SCREEN  PREPARE RBC (CROSSMATCH)    EKG EKG Interpretation  Date/Time:  Tuesday February 25 2018 16:02:00 EDT Ventricular Rate:  55 PR Interval:  106 QRS Duration: 108 QT Interval:  514 QTC Calculation: 491 R Axis:   27 Text Interpretation:  Sinus bradycardia with short PR Low voltage QRS Incomplete right bundle branch block Cannot rule out Inferior infarct , age undetermined Abnormal ECG When comapred to prior, slower rate.  No STEMI Confirmed by Antony Blackbird [phone]) on 02/25/2018 7:49:35 PM   Radiology Dg Chest 2 View  Result Date: 02/25/2018 CLINICAL DATA:  Short of breath on exertion EXAM: CHEST - 2 VIEW COMPARISON:  12/25/2017, 09/27/2017 FINDINGS: Postsurgical changes in the cervical spine. Tiny pleural effusions. No focal consolidation. Borderline to mild cardiomegaly. Aortic atherosclerosis. No pneumothorax. Treated compression deformity of the lower spine. Right shoulder replacement. IMPRESSION: Tiny pleural  effusions.  Stable borderline to mild cardiomegaly. Electronically Signed   By: Donavan Foil M.D.   On: 02/25/2018 21:10    Procedures Procedures (including critical care time)  CRITICAL CARE Performed by: Gwenyth Allegra Tegeler Total critical care time: 45 minutes Critical care time was exclusive of separately billable procedures and treating other patients. Symptomatic anemia with AKI requiring blood transfusion. Critical care was necessary to treat or prevent imminent or life-threatening deterioration. Critical care was time spent personally by me on the following activities: development of treatment plan with patient and/or surrogate as well as nursing, discussions with consultants, evaluation of patient's response to treatment, examination of patient, obtaining history from patient or surrogate, ordering and performing treatments and interventions, ordering and review of laboratory studies, ordering and review of radiographic studies, pulse oximetry and re-evaluation of patient's condition.   Medications Ordered in ED Medications  magnesium oxide (MAG-OX) tablet 400 mg (400 mg Oral Given 02/25/18 2330)  ferrous sulfate tablet 325 mg (has no administration in time range)  cyclobenzaprine (FLEXERIL) tablet 10 mg (has no administration in time range)  albuterol (PROVENTIL) (2.5 MG/3ML) 0.083% nebulizer solution 2.5 mg (has no administration in time range)  aspirin EC tablet 81 mg (81 mg Oral Given 02/25/18 2330)  cholecalciferol (VITAMIN D) tablet 5,000 Units (has no administration in time range)  ondansetron (ZOFRAN) tablet 4 mg (has no administration in time range)  bisoprolol (ZEBETA) tablet 10 mg (has no administration in time range)  gabapentin (NEURONTIN) capsule 600 mg (has no administration in time range)  rifampin (RIFADIN) capsule 300 mg (has no administration in time range)  citalopram (CELEXA) tablet 40 mg (has no administration in time range)  vitamin C (ASCORBIC ACID) tablet  500 mg (has no administration in time range)  metolazone (ZAROXOLYN) tablet 5 mg (has no administration in time range)  ipratropium (ATROVENT) nebulizer solution 0.6 mg (has no administration in time range)  sodium chloride flush (NS) 0.9 % injection 3 mL (3 mLs Intravenous Not Given 02/25/18 2344)  sodium chloride flush (NS) 0.9 % injection 3 mL (has no administration in time range)  0.9 %  sodium chloride infusion (has no administration in time range)  ondansetron (ZOFRAN) tablet 4 mg (has no administration in time range)    Or  ondansetron (ZOFRAN) injection 4 mg (has no administration in time range)  fentaNYL (SUBLIMAZE) injection 50 mcg (50 mcg Intramuscular Given 02/25/18 1605)     Initial Impression / Assessment and Plan / ED Course  I have reviewed the triage vital signs and the nursing notes.  Pertinent labs & imaging results that were available during my care of the patient were reviewed by me and considered in my medical decision making (see chart for details).     TYRA GURAL is a 72 y.o. female with a past medical history significant for liver cancer status post ablation, cirrhosis, CHF, diabetes,  hypertension, hyperlipidemia, COPD with home oxygen, and rectal bleeding with esophageal varices who presents with fatigue, 10 pound weight gain with edema, exertional shortness of breath, and continued rectal bleeding.  Patient reports that for the last few days she has had worsening edema in her legs, arms, and abdomen.  She reports this is similar to her symptoms requiring admission last time.  She reports she is continued to have very dark tarry stools and is taking the iron as recommended.  She reports having nausea no vomiting.  She does report feeling more fatigued.  She denies chest pain or palpitations but reports shortness of breath.  She reports worsened shortness of her symptoms with ambulation.  She takes 3 L at home at baseline.  She is concerned she may need fluid removed  since the Lasix is not helping by report.  She denies fevers, chills, or trauma.  Next  On exam, abdomen is distended.  Patient has edema in both legs as well as her upper extremities, right worse than left.  She reports that her right arm is more swollen at baseline due to surgeries.  She denies any chest pain her chest is nontender.  Abdomen is mildly tender diffusely.  Back is nontender.  Lungs had crackles in the bases bilaterally.  Clinically I am concerned about CHF exacerbation and fluid overload worsening.  I am also concerned about symptom medic anemia given her report of continued dark tarry stools, fatigue, and exertional shortness of breath.  Anticipate reassessment after diagnostic testing.  Patient's laboratory testing showed acute kidney injury, worsens BNP, and anemia of 7.5.  Clinically I am concerned about CHF exacerbation, AKI, and symptom medic anemia.    Patient will have blood ordered and will be admitted to hospital service.  Next  Patient admitted for further management.   Final Clinical Impressions(s) / ED Diagnoses   Final diagnoses:  Acute on chronic congestive heart failure, unspecified heart failure type (HCC)  Edema, unspecified type  Exertional shortness of breath  AKI (acute kidney injury) ([place])  Symptomatic anemia    ED Discharge Orders    None     Clinical Impression: 1. Acute on chronic congestive heart failure, unspecified heart failure type ([place])   2. Edema, unspecified type   3. Exertional shortness of breath   4. AKI (acute kidney injury) ([place])   5. Symptomatic anemia   6. SOB (shortness of breath)     Disposition: Admit  This note was prepared with assistance of Dragon voice recognition software. Occasional wrong-word or sound-a-like substitutions may have occurred due to the inherent limitations of voice recognition software.     Tegeler, Gwenyth Allegra, MD 02/26/18 [id number]

## 2018-02-25 NOTE — ED Triage Notes (Signed)
Husband stated she has cirrhosis of liver and she needs the fluid drawn off.

## 2018-02-25 NOTE — ED Notes (Signed)
Pt needs a urine sample. UA cup given. Pt states she is unable to provide at this time.

## 2018-02-25 NOTE — H&P (Signed)
History and Physical    [name] ZOX:[id number] DOB: 10-18-1945 DOA: 02/25/2018  PCP: Unk Pinto, MD  Patient coming from: Home  Chief Complaint: Swelling and shortness of breath  HPI: [name] is a 72 y.o. female with medical history significant of chronic kidney disease, COPD, advanced cirrhosis of the liver due to [name], hypertension comes in with her husband due to swelling increased abdominal girth and worsening shortness of breath.  She denies any fevers.  She denies any nausea vomiting or diarrhea.  She is been taking care of mostly by her husband at home who is along with her.  Her stools are always black and they deny any worsening of her stool color or any bright red blood per rectum.  She was recently hospitalized and had increase in her diuretics and this is not helping.  She is gained over 10 pounds in the last week.  They have been offered hospice care at home but her husband thinks that he does not need this at this time and reclined.  Patient is being referred for admission for the need for a repeat centesis to help relieve her shortness of breath.  Review of Systems: As per HPI otherwise 10 point review of systems negative.   Past Medical History:  Diagnosis Date  . Asthma   . Cancer [facility])    liver cancer   . COPD (chronic obstructive pulmonary disease) ([place])    x 3 years   . Depression   . Diabetes mellitus without complication ([place])    diet controlled  not on meds   . Edema    left leg   . Esophageal varices ([place])   . Fibromyalgia   . Gastritis   . GERD (gastroesophageal reflux disease)   . Hepatic cirrhosis ([place])   . Hepatic encephalopathy ([place])   . History of blood transfusion   . Hyperlipidemia   . Hypertension   . IBS (irritable bowel syndrome)   . Phlebitis    left leg x 2   . Pneumonia    hx of   . Splenomegaly   . Vitamin D deficiency     Past Surgical History:  Procedure Laterality Date  . ABDOMINAL HYSTERECTOMY    . BACK  SURGERY    . CATARACT EXTRACTION, BILATERAL    . CHOLECYSTECTOMY    . ESOPHAGOGASTRODUODENOSCOPY  07/16/2012   Procedure: ESOPHAGOGASTRODUODENOSCOPY (EGD);  Surgeon: Inda Castle, MD;  Location: [facility];  Service: Endoscopy;  Laterality: N/A;  . ESOPHAGOGASTRODUODENOSCOPY N/A 01/20/2018   Procedure: ESOPHAGOGASTRODUODENOSCOPY (EGD);  Surgeon: Ladene Artist, MD;  Location: [facility] ENDOSCOPY;  Service: Endoscopy;  Laterality: N/A;  . GASTRIC VARICES BANDING  07/16/2012   Procedure: GASTRIC VARICES BANDING;  Surgeon: Inda Castle, MD;  Location: [facility] ENDOSCOPY;  Service: Endoscopy;  Laterality: N/A;  . HIP ARTHROPLASTY Bilateral   . IR GENERIC HISTORICAL  06/25/2016   IR RADIOLOGIST EVAL & MGMT 06/25/2016 [facility]  . IR GENERIC HISTORICAL  06/27/2016   IR KYPHO LUMBAR INC FX REDUCE BONE BX UNI/BIL CANNULATION INC/IMAGING 06/27/2016 Luanne Bras, MD [facility]  . IR GENERIC HISTORICAL  07/17/2016   IR RADIOLOGIST EVAL & MGMT 07/17/2016 [facility]  . IR PARACENTESIS  01/20/2018  . IR RADIOLOGIST EVAL & MGMT  07/10/2017  . IR RADIOLOGIST EVAL & MGMT  10/01/2017  . IR RADIOLOGIST EVAL & MGMT  12/11/2017  . IR THORACENTESIS ASP PLEURAL SPACE W/IMG GUIDE  09/12/2017  . IR THORACENTESIS ASP PLEURAL SPACE  W/IMG GUIDE  09/17/2017  . NECK SURGERY    . RADIOFREQUENCY ABLATION N/A 08/30/2017   Procedure: CT MICROWAVE THERMAL ABLATION;  Surgeon: Arne Cleveland, MD;  Location: [facility] ORS;  Service: Anesthesiology;  Laterality: N/A;  . SHOULDER SURGERY Right      reports that she quit smoking about 3 years ago. Her smoking use included cigarettes. She has a 50.00 pack-year smoking history. She has never used smokeless tobacco. She reports that she does not drink alcohol or use drugs.  Allergies  Allergen Reactions  . Atorvastatin Other (See Comments)    Unknown reaction  . Diphenhydramine Hcl (Sleep) Hives  . Hydrocodone-Acetaminophen Other (See Comments)    Unknown reaction  . Lopid  [Gemfibrozil] Other (See Comments)    Unknown reaction  . Loratadine Hives  . Lorazepam Hives  . Simvastatin Other (See Comments)    Unknown reaction  . Sulfamethoxazole Hives  . Sulfonamide Derivatives Hives    Family History  Problem Relation Age of Onset  . Diabetes Brother        deceased  . Heart disease Sister        A Fib  . Heart disease Mother        CHF  . Atrial fibrillation Sister   . Cancer Father        lung cancer   . Cancer Maternal Uncle        unknown type cancer   . Cancer Other        lung cancer   . Colon cancer Neg Hx     Prior to Admission medications   Medication Sig Start Date End Date Taking? Authorizing Provider  albuterol (PROVENTIL HFA;VENTOLIN HFA) 108 (90 Base) MCG/ACT inhaler Inhale 2 puffs into the lungs every 6 (six) hours as needed for wheezing or shortness of breath. 05/27/16  Yes Ward, Delice Bison, DO  allopurinol (ZYLOPRIM) 300 MG tablet take 1 tablet (300 mg) by mouth daily with supper - for gout 09/28/17  Yes Allie Bossier, MD  ALPRAZolam Duanne Moron) 0.25 MG tablet Take 1 tab as needed for anxiety, cramping, air hunger every 8 hours as needed. 11/01/17  Yes Liane Comber, NP  aspirin EC 81 MG tablet Take 81 mg by mouth at bedtime.   Yes [provider]  bisoprolol (ZEBETA) 10 MG tablet Take 1 tablet (10 mg total) by mouth daily. 10/17/17  Yes Unk Pinto, MD  Cholecalciferol (VITAMIN D3) 5000 units CAPS Take 5,000 Units by mouth daily with breakfast.   Yes [provider]  citalopram (CELEXA) 40 MG tablet TAKE ONE TABLET BY MOUTH TWICE DAILY 01/28/18  Yes Unk Pinto, MD  cyclobenzaprine (FLEXERIL) 10 MG tablet Take 1 tablet (10 mg total) by mouth 3 (three) times daily as needed for muscle spasms. 03/06/16  Yes Unk Pinto, MD  ferrous sulfate 325 (65 FE) MG tablet Take 325 mg by mouth 3 (three) times daily with meals.    Yes [provider]  gabapentin (NEURONTIN) 600 MG tablet Take 600 mg by mouth 3  (three) times daily.    Yes [provider]  ipratropium (ATROVENT) 0.02 % nebulizer solution Take 3 mLs by nebulization every 4 (four) hours as needed for wheezing or shortness of breath. 01/29/18  Yes [provider]  lactulose (CHRONULAC) 10 GM/15ML solution take 59ms TWICE DAILY Patient taking differently: Take 20 g by mouth daily.  08/08/17  Yes MUnk Pinto MD  Magnesium 250 MG TABS Take 250 mg by mouth at bedtime.  Yes [provider]  metolazone (ZAROXOLYN) 5 MG tablet Take 1 tablet daily or as directed for fluid Overload 02/20/18  Yes Unk Pinto, MD  ondansetron (ZOFRAN) 4 MG tablet Take 1 tablet (4 mg total) by mouth every 6 (six) hours as needed for nausea or vomiting. 09/02/17  Yes Vicie Mutters, PA-C  OXYGEN Inhale 2-3 L into the lungs continuous. 3 L with exertion   Yes [provider]  potassium chloride (K-DUR) 10 MEQ tablet TAKE 2 TABLETS BY MOUTH 3 TIMES A DAY. 02/11/18  Yes Unk Pinto, MD  rifampin (RIFADIN) 300 MG capsule Take 300 mg by mouth 2 (two) times daily.    Yes [provider]  spironolactone (ALDACTONE) 25 MG tablet TAKE ONE TABLET BY MOUTH THREE TIMES A DAY. FOR FLUIDS AND SWELLING Patient taking differently: TAKE  TWO TABLET BY MOUTH THREE TIMES A DAY. FOR FLUIDS AND SWELLING 02/11/18  Yes Unk Pinto, MD  torsemide (DEMADEX) 20 MG tablet Take 3 tablets 2 x/ day  Morning & Nite for fluid Retention & Swelling Patient taking differently: Take 40 mg by mouth 2 (two) times daily.  11/25/17  Yes Unk Pinto, MD  traMADol Veatrice Bourbon) 50 MG tablet Take 50 mg by mouth every 12 hours as needed for pain for chronic pain 11/18/17  Yes Vicie Mutters, PA-C  vitamin C (ASCORBIC ACID) 500 MG tablet Take 500 mg by mouth 3 (three) times daily.   Yes [provider]  Glycopyrrolate-Formoterol (BEVESPI AEROSPHERE) 9-4.8 MCG/ACT AERO Inhale 2 puffs into the lungs 2 (two) times daily. 12/25/17   Tanda Rockers, MD     Physical Exam: Vitals:   02/25/18 1835 02/25/18 2000 02/25/18 2015 02/25/18 2045  BP: (!) 116/52 (!) 106/45 100/71   Pulse: 62 61 62 61  Resp: 18   16  Temp: 98 F (36.7 C)     TempSrc: Oral     SpO2: 100% 100% 100% 100%      Constitutional: NAD, calm, comfortable Vitals:   02/25/18 1835 02/25/18 2000 02/25/18 2015 02/25/18 2045  BP: (!) 116/52 (!) 106/45 100/71   Pulse: 62 61 62 61  Resp: 18   16  Temp: 98 F (36.7 C)     TempSrc: Oral     SpO2: 100% 100% 100% 100%   Eyes: PERRL, lids and conjunctivae normal ENMT: Mucous membranes are moist. Posterior pharynx clear of any exudate or lesions.Normal dentition.  Neck: normal, supple, no masses, no thyromegaly Respiratory: clear to auscultation bilaterally, no wheezing, no crackles. Normal respiratory effort. No accessory muscle use.  Cardiovascular: Regular rate and rhythm, no murmurs / rubs / gallops.  Plus extremity edema. 2+ pedal pulses. No carotid bruits.  Abdomen: no tenderness, no masses palpated. No hepatosplenomegaly. Bowel sounds positive.  Moderate ascites Musculoskeletal: no clubbing / cyanosis. No joint deformity upper and lower extremities. Good ROM, no contractures. Normal muscle tone.  Skin: no rashes, lesions, ulcers. No induration Neurologic: CN 2-12 grossly intact. Sensation intact, DTR normal. Strength 5/5 in all 4.  Psychiatric: Normal judgment and insight. Alert and oriented x 3. Normal mood.    Labs on Admission: I have personally reviewed following labs and imaging studies  CBC: Recent Labs  Lab 02/19/18 1447 02/25/18 1538  WBC 5.7 5.9  NEUTROABS 4.4  --   HGB 8.0 Repeated and verified X2.* 7.5*  HCT 24.0* 24.0*  MCV 104.4* 111.6*  PLT 87.0* 92*   Basic Metabolic Panel: Recent Labs  Lab 02/19/18 1447 02/25/18 1538  NA 134* 133*  K 3.5 3.6  CL 94* 91*  CO2 34* 31  GLUCOSE 156* 137*  BUN 58* 68*  CREATININE 1.76* 2.21*  CALCIUM 8.3* 8.5*   GFR: Estimated Creatinine Clearance:  23.4 mL/min (A) (by C-G formula based on SCr of 2.21 mg/dL (H)). Liver Function Tests: Recent Labs  Lab 02/25/18 1538  AST 27  ALT 20  ALKPHOS 137*  BILITOT 1.4*  PROT 4.8*  ALBUMIN 2.5*   No results for input(s): LIPASE, AMYLASE in the last 168 hours. Recent Labs  Lab 02/19/18 1447 02/25/18 2113  AMMONIA 71* 46*   Coagulation Profile: Recent Labs  Lab 02/25/18 2113  INR 1.14   Cardiac Enzymes: No results for input(s): CKTOTAL, CKMB, CKMBINDEX, TROPONINI in the last 168 hours. BNP (last 3 results) No results for input(s): PROBNP in the last 8760 hours. HbA1C: No results for input(s): HGBA1C in the last 72 hours. CBG: Recent Labs  Lab 02/25/18 1607  GLUCAP 119*   Lipid Profile: No results for input(s): CHOL, HDL, LDLCALC, TRIG, CHOLHDL, LDLDIRECT in the last 72 hours. Thyroid Function Tests: No results for input(s): TSH, T4TOTAL, FREET4, T3FREE, THYROIDAB in the last 72 hours. Anemia Panel: No results for input(s): VITAMINB12, FOLATE, FERRITIN, TIBC, IRON, RETICCTPCT in the last 72 hours. Urine analysis:    Component Value Date/Time   COLORURINE YELLOW 02/25/2018 1706   APPEARANCEUR CLEAR 02/25/2018 1706   LABSPEC 1.008 02/25/2018 1706   PHURINE 6.0 02/25/2018 1706   GLUCOSEU NEGATIVE 02/25/2018 1706   HGBUR NEGATIVE 02/25/2018 1706   BILIRUBINUR NEGATIVE 02/25/2018 1706   KETONESUR NEGATIVE 02/25/2018 1706   PROTEINUR NEGATIVE 02/25/2018 1706   UROBILINOGEN 1 01/13/2015 1145   NITRITE NEGATIVE 02/25/2018 1706   LEUKOCYTESUR TRACE (A) 02/25/2018 1706   Sepsis Labs: !!!!!!!!!!!!!!!!!!!!!!!!!!!!!!!!!!!!!!!!!!!! @LABRCNTIP (procalcitonin:4,lacticidven:4) )No results found for this or any previous visit (from the past 240 hour(s)).   Radiological Exams on Admission: Dg Chest 2 View  Result Date: 02/25/2018 CLINICAL DATA:  Short of breath on exertion EXAM: CHEST - 2 VIEW COMPARISON:  12/25/2017, 09/27/2017 FINDINGS: Postsurgical changes in the cervical  spine. Tiny pleural effusions. No focal consolidation. Borderline to mild cardiomegaly. Aortic atherosclerosis. No pneumothorax. Treated compression deformity of the lower spine. Right shoulder replacement. IMPRESSION: Tiny pleural effusions.  Stable borderline to mild cardiomegaly. Electronically Signed   By: Donavan Foil M.D.   On: 02/25/2018 21:10    Old chart reviewed  Case discussed with Dr. Sherry Ruffing in the ED  Chest x-ray reviewed tiny pleural effusions no focal edema or infiltrate  Assessment/Plan 72 year old female with advanced cirrhosis of liver secondary to [facility] decompensated  Principal Problem:   Liver cirrhosis secondary to nonalcoholic steatohepatitis (NASH) (HCC)-we will consult IR for another therapeutic paracentesis.  Continue spironolactone.  Med rec is pending pharmacy reviewed and is not completed.  I have discussed with patient and her husband the severity of her liver disease and have offered them hospice again they declined.  Try to relieve some symptoms by pulling fluid off.  In the setting of worsening renal failure also with overall very poor prognosis.  Will transfuse 1 unit of blood hopefully this will help her some symptomatically.  Active Problems:   Ascites-IR to do paracentesis in the morning    Symptomatic anemia-transfuse 1 unit of blood to maintain her hemoglobin above 8 per GI recommendations last hospitalization    Esophageal varices (HCC)-no active bleeding at this time    Essential hypertension-noted    Chronic respiratory failure (HCC)-no worse  than normal continue supplemental oxygen    Chronic diastolic CHF (congestive heart failure) (HCC)-stable    CKD (chronic kidney disease), stage III (HCC)-bumped up to 2.21 creatinine from 1.7 last week   Med rec incomplete per pharmacy  I discussed CODE STATUS with patient and her husband she is a DNR which is been confirmed what I would offer again home hospice as I think she would benefit from this  before she is discharged.   DVT prophylaxis: SCDs Code Status: DNR Family Communication: Husband Disposition Plan: Days Consults called: None Admission status: Admission   [name] A MD [facility]  If 7PM-7AM, please contact night-coverage [email or web address] Password [facility]  02/25/2018, 10:47 PM

## 2018-02-25 NOTE — ED Triage Notes (Signed)
Husband stated, she's had a lot of fluid overload, giving pills to get rid of fluid but not working.  Her kidney functions are abnormal and liver functions are abnormal. She seems to be getting worse.

## 2018-02-25 NOTE — ED Provider Notes (Signed)
Patient placed in Quick Look pathway, seen and evaluated   Chief Complaint: swelling   HPI:   Pt has liver disease   ROS: weakness and swelling of abdomen  Physical Exam:   Gen: No distress  Neuro: Awake and Alert  Skin: Warm    Focused Exam: abdomen distended   Initiation of care has begun. The patient has been counseled on the process, plan, and necessity for staying for the completion/evaluation, and the remainder of the medical screening examination   Sidney Ace 02/25/18 [place], Gwenyth Allegra, MD 02/26/18 [phone]

## 2018-02-26 ENCOUNTER — Other Ambulatory Visit: Payer: Self-pay | Admitting: Internal Medicine

## 2018-02-26 ENCOUNTER — Inpatient Hospital Stay (HOSPITAL_COMMUNITY): Payer: PPO

## 2018-02-26 ENCOUNTER — Other Ambulatory Visit: Payer: Self-pay

## 2018-02-26 ENCOUNTER — Encounter (HOSPITAL_COMMUNITY): Payer: Self-pay | Admitting: General Practice

## 2018-02-26 DIAGNOSIS — K7460 Unspecified cirrhosis of liver: Principal | ICD-10-CM

## 2018-02-26 DIAGNOSIS — R609 Edema, unspecified: Secondary | ICD-10-CM

## 2018-02-26 DIAGNOSIS — K7581 Nonalcoholic steatohepatitis (NASH): Secondary | ICD-10-CM

## 2018-02-26 LAB — BPAM RBC
Blood Product Expiration Date: 201908212359
ISSUE DATE / TIME: 201907232326
Unit Type and Rh: 7300

## 2018-02-26 LAB — TYPE AND SCREEN
ABO/RH(D): B POS
Antibody Screen: NEGATIVE
UNIT DIVISION: 0

## 2018-02-26 LAB — CBC
HCT: 23.3 % — ABNORMAL LOW (ref 36.0–46.0)
HEMOGLOBIN: 7.5 g/dL — AB (ref 12.0–15.0)
MCH: 33.9 pg (ref 26.0–34.0)
MCHC: 32.2 g/dL (ref 30.0–36.0)
MCV: 105.4 fL — ABNORMAL HIGH (ref 78.0–100.0)
Platelets: 72 10*3/uL — ABNORMAL LOW (ref 150–400)
RBC: 2.21 MIL/uL — ABNORMAL LOW (ref 3.87–5.11)
RDW: 21.9 % — ABNORMAL HIGH (ref 11.5–15.5)
WBC: 3.8 10*3/uL — ABNORMAL LOW (ref 4.0–10.5)

## 2018-02-26 LAB — BASIC METABOLIC PANEL
Anion gap: 11 (ref 5–15)
BUN: 65 mg/dL — AB (ref 8–23)
CALCIUM: 7.9 mg/dL — AB (ref 8.9–10.3)
CHLORIDE: 92 mmol/L — AB (ref 98–111)
CO2: 30 mmol/L (ref 22–32)
Creatinine, Ser: 2.07 mg/dL — ABNORMAL HIGH (ref 0.44–1.00)
GFR calc Af Amer: 27 mL/min — ABNORMAL LOW (ref >60)
GFR calc non Af Amer: 23 mL/min — ABNORMAL LOW (ref >60)
GLUCOSE: 82 mg/dL (ref 70–99)
POTASSIUM: 3.2 mmol/L — AB (ref 3.5–5.1)
Sodium: 133 mmol/L — ABNORMAL LOW (ref 135–145)

## 2018-02-26 LAB — OCCULT BLOOD X 1 CARD TO LAB, STOOL: Fecal Occult Bld: POSITIVE — AB

## 2018-02-26 MED ORDER — CITALOPRAM HYDROBROMIDE 40 MG PO TABS
40.0000 mg | ORAL_TABLET | Freq: Every day | ORAL | Status: DC
  Administered 2018-02-26 – 2018-03-01 (×4): 40 mg via ORAL
  Filled 2018-02-26 (×3): qty 1

## 2018-02-26 MED ORDER — TRAMADOL HCL 50 MG PO TABS
50.0000 mg | ORAL_TABLET | Freq: Two times a day (BID) | ORAL | Status: DC | PRN
  Administered 2018-02-26 – 2018-02-28 (×4): 50 mg via ORAL
  Filled 2018-02-26 (×4): qty 1

## 2018-02-26 NOTE — Progress Notes (Signed)
PT Cancellation Note  Patient Details Name: [name] MRN: [id number] DOB: [birth year]   Cancelled Treatment:    Reason Eval/Treat Not Completed: Patient at procedure or test/unavailable. Will follow-up for PT evaluation as schedule permits.  Mabeline Caras, PT, DPT Acute Rehab Services  Pager: [place] 02/26/2018, 12:01 PM

## 2018-02-26 NOTE — Progress Notes (Signed)
Preliminary notes--Right upper extremity venous duplex exam completed.  Negative for DVT. Study limited due to patient has minimum tolerance to transducer compression.  Hongying Landry Mellow (RDMS RVT) 02/26/18 12:15 PM

## 2018-02-26 NOTE — Progress Notes (Signed)
Patient received from ED via bed. Patient is alert and oriented x4. Vital signs are stable. Skin assessment done with another. Iv in place.patient given instruction about call bell and phone. Bed in low position and call bell in reach.

## 2018-02-26 NOTE — Progress Notes (Signed)
Called to bring pt down for paracentesis. Pt on the way to vascular. Will attempt to bring her down later.

## 2018-02-26 NOTE — Consult Note (Addendum)
[facility] Consult: 10:27 AM 02/26/2018  LOS: 1 day    Referring Provider: Dr Erlinda Hong  Primary Care Physician:  Unk Pinto, MD Primary Gastroenterologist:  Dr. Havery Moros     Reason for Consultation:  Ascites.     HPI: [name] is a 72 y.o. female.  Hx O2 dependent COPD.  Right shoulder surgeries, first at 27 weeks old for infection; ultimately her right arm stopped growing.  Obesity.  CHF, grade 2 diastolic.  Stage 3 CKD.  Htn.  NASH cirrhosis; Child's score 9 in 09/2017.  Stage 2 HCC, s/p IR ablation x 2 (lesions in left and right lobes) in 08/2017; MRI 12/2017 with ablation related defects, no new lesions.  HE, on Lactulose, unable to afford Rifaximin.  Thrombocytopenia.  Macrocytic anemia.  Recurrent pleural effusion requiring throacentesis, ? Underlying hepatic hydrothorax.  Ascites. S/p 2.6 liter paracentesis 09/22/17 without SBP, 700 ml paracentesis 01/20/18: no SBP .  Anasarca.    Likeley non TIPS candidate due to CHF (absolute contraindication), previous ablation, hx HE.     FOBT + anemia attributed to blood loss from portal gastropathy.     01/20/18 EGD.  Grade 1 esoph varices, moderate portal gastropathy.   Regarding blood transfusion, she received 1 unit of blood on 01/19/2018.    Admission 6/16 -01/21/2018 with weakness and hemoglobin 7.1.  She received 1 unit PRBCs.  Underwent diagnostic paracentesis and did not have SBP.    Doses of Aldactone increased from 75 mg daily to 150 mg daily on 7/16 for weight gain. Pt on stable dose of Torsemide.   Bisoprolol for htn.    Omeprazole 40 mg added at GI OV 02/19/18.   Despite increased Aldactone, she continues to have swelling of abdomen, arms, chest, not in legs/feet.  Weight 186#, was 185 # on 7/17, 192 on 6/24.   Increased dyspnea and exercise  intolerance.  + UE tremors.  Mental slowing, has to think harder to speak and find words but no increased sleepiness.  Good appetite.  Occasional fleeting nausea after taking meds.    Chronic oral iron, stools chronically black, dark.  Compliant with lactulose, occasionally skips a dose if she's had 2 or 3 stools during the day.  Discomfort in upper abdomen is occasional.    Admitted  Yesterday after presenting to ED with increased swelling, weakness, fatigue and dyspnea.  CXR: tiny pleural effusions.  Paracentisis ordered, has not yet had belly imaging.   Ammonia 46, was 71 on 02/19/18.   +  AKI, GFR 21 compared with high 30s -45 last month.   Hgb 7.5.      Past Medical History:  Diagnosis Date  . Asthma   . Cancer [facility])    liver cancer   . COPD (chronic obstructive pulmonary disease) ([place])    x 3 years   . Depression   . Diabetes mellitus without complication ([place])    diet controlled  not on meds   . Edema    left leg   . Esophageal varices ([place])   . Fibromyalgia   .  Gastritis   . GERD (gastroesophageal reflux disease)   . Hepatic cirrhosis ([place])   . Hepatic encephalopathy ([place])   . History of blood transfusion   . Hyperlipidemia   . Hypertension   . IBS (irritable bowel syndrome)   . Phlebitis    left leg x 2   . Pneumonia    hx of   . Splenomegaly   . Vitamin D deficiency     Past Surgical History:  Procedure Laterality Date  . ABDOMINAL HYSTERECTOMY    . BACK SURGERY    . CATARACT EXTRACTION, BILATERAL    . CHOLECYSTECTOMY    . ESOPHAGOGASTRODUODENOSCOPY  07/16/2012   Procedure: ESOPHAGOGASTRODUODENOSCOPY (EGD);  Surgeon: Inda Castle, MD;  Location: [facility];  Service: Endoscopy;  Laterality: N/A;  . ESOPHAGOGASTRODUODENOSCOPY N/A 01/20/2018   Procedure: ESOPHAGOGASTRODUODENOSCOPY (EGD);  Surgeon: Ladene Artist, MD;  Location: [facility] ENDOSCOPY;  Service: Endoscopy;  Laterality: N/A;  . GASTRIC VARICES BANDING  07/16/2012   Procedure: GASTRIC VARICES  BANDING;  Surgeon: Inda Castle, MD;  Location: [facility] ENDOSCOPY;  Service: Endoscopy;  Laterality: N/A;  . HIP ARTHROPLASTY Bilateral   . IR GENERIC HISTORICAL  06/25/2016   IR RADIOLOGIST EVAL & MGMT 06/25/2016 [facility]  . IR GENERIC HISTORICAL  06/27/2016   IR KYPHO LUMBAR INC FX REDUCE BONE BX UNI/BIL CANNULATION INC/IMAGING 06/27/2016 Luanne Bras, MD [facility]  . IR GENERIC HISTORICAL  07/17/2016   IR RADIOLOGIST EVAL & MGMT 07/17/2016 [facility]  . IR PARACENTESIS  01/20/2018  . IR RADIOLOGIST EVAL & MGMT  07/10/2017  . IR RADIOLOGIST EVAL & MGMT  10/01/2017  . IR RADIOLOGIST EVAL & MGMT  12/11/2017  . IR THORACENTESIS ASP PLEURAL SPACE W/IMG GUIDE  09/12/2017  . IR THORACENTESIS ASP PLEURAL SPACE W/IMG GUIDE  09/17/2017  . NECK SURGERY    . RADIOFREQUENCY ABLATION N/A 08/30/2017   Procedure: CT MICROWAVE THERMAL ABLATION;  Surgeon: Arne Cleveland, MD;  Location: [facility] ORS;  Service: Anesthesiology;  Laterality: N/A;  . SHOULDER SURGERY Right     Prior to Admission medications   Medication Sig Start Date End Date Taking? Authorizing Provider  albuterol (PROVENTIL HFA;VENTOLIN HFA) 108 (90 Base) MCG/ACT inhaler Inhale 2 puffs into the lungs every 6 (six) hours as needed for wheezing or shortness of breath. 05/27/16  Yes Ward, Delice Bison, DO  allopurinol (ZYLOPRIM) 300 MG tablet take 1 tablet (300 mg) by mouth daily with supper - for gout 09/28/17  Yes Allie Bossier, MD  ALPRAZolam Duanne Moron) 0.25 MG tablet Take 1 tab as needed for anxiety, cramping, air hunger every 8 hours as needed. 11/01/17  Yes Liane Comber, NP  aspirin EC 81 MG tablet Take 81 mg by mouth at bedtime.   Yes [provider]  bisoprolol (ZEBETA) 10 MG tablet Take 1 tablet (10 mg total) by mouth daily. 10/17/17  Yes Unk Pinto, MD  Cholecalciferol (VITAMIN D3) 5000 units CAPS Take 5,000 Units by mouth daily with breakfast.   Yes [provider]  citalopram (CELEXA) 40 MG tablet TAKE ONE  TABLET BY MOUTH TWICE DAILY 01/28/18  Yes Unk Pinto, MD  cyclobenzaprine (FLEXERIL) 10 MG tablet Take 1 tablet (10 mg total) by mouth 3 (three) times daily as needed for muscle spasms. 03/06/16  Yes Unk Pinto, MD  ferrous sulfate 325 (65 FE) MG tablet Take 325 mg by mouth 3 (three) times daily with meals.    Yes [provider]  gabapentin (NEURONTIN) 600 MG tablet Take 600  mg by mouth 3 (three) times daily.    Yes [provider]  ipratropium (ATROVENT) 0.02 % nebulizer solution Take 3 mLs by nebulization every 4 (four) hours as needed for wheezing or shortness of breath. 01/29/18  Yes [provider]  lactulose (CHRONULAC) 10 GM/15ML solution take 16ms TWICE DAILY Patient taking differently: Take 20 g by mouth daily.  08/08/17  Yes MUnk Pinto MD  Magnesium 250 MG TABS Take 250 mg by mouth at bedtime.    Yes [provider]  metolazone (ZAROXOLYN) 5 MG tablet Take 1 tablet daily or as directed for fluid Overload 02/20/18  Yes MUnk Pinto MD  ondansetron (ZOFRAN) 4 MG tablet Take 1 tablet (4 mg total) by mouth every 6 (six) hours as needed for nausea or vomiting. 09/02/17  Yes CVicie Mutters PA-C  OXYGEN Inhale 2-3 L into the lungs continuous. 3 L with exertion   Yes [provider]  potassium chloride (K-DUR) 10 MEQ tablet TAKE 2 TABLETS BY MOUTH 3 TIMES A DAY. 02/11/18  Yes MUnk Pinto MD  rifampin (RIFADIN) 300 MG capsule Take 300 mg by mouth 2 (two) times daily.    Yes [provider]  spironolactone (ALDACTONE) 25 MG tablet TAKE ONE TABLET BY MOUTH THREE TIMES A DAY. FOR FLUIDS AND SWELLING Patient taking differently: TAKE  TWO TABLET BY MOUTH THREE TIMES A DAY. FOR FLUIDS AND SWELLING 02/11/18  Yes MUnk Pinto MD  torsemide (DEMADEX) 20 MG tablet Take 3 tablets 2 x/ day  Morning & Nite for fluid Retention & Swelling Patient taking differently: Take 40 mg by mouth 2 (two) times daily.  11/25/17  Yes MUnk Pinto MD  traMADol (Veatrice Bourbon 50 MG tablet Take 50 mg by mouth every 12 hours as needed for pain for chronic pain 11/18/17  Yes CVicie Mutters PA-C  vitamin C (ASCORBIC ACID) 500 MG tablet Take 500 mg by mouth 3 (three) times daily.   Yes [provider]  Glycopyrrolate-Formoterol (BEVESPI AEROSPHERE) 9-4.8 MCG/ACT AERO Inhale 2 puffs into the lungs 2 (two) times daily. 12/25/17   WTanda Rockers MD    Scheduled Meds: . aspirin EC  81 mg Oral QHS  . bisoprolol  10 mg Oral Daily  . cholecalciferol  5,000 Units Oral Q breakfast  . citalopram  40 mg Oral BID  . ferrous sulfate  325 mg Oral TID WC  . gabapentin  600 mg Oral TID  . magnesium oxide  400 mg Oral QHS  . metolazone  5 mg Oral Daily  . rifampin  300 mg Oral BID  . sodium chloride flush  3 mL Intravenous Q12H  . vitamin C  500 mg Oral TID   Infusions: . sodium chloride     PRN Meds: sodium chloride, albuterol, cyclobenzaprine, ipratropium, ondansetron **OR** ondansetron (ZOFRAN) IV, ondansetron, sodium chloride flush, traMADol   Allergies as of 02/25/2018 - Review Complete 02/25/2018  Allergen Reaction Noted  . Atorvastatin Other (See Comments)   . Diphenhydramine hcl (sleep) Hives 07/22/2015  . Hydrocodone-acetaminophen Other (See Comments) 07/22/2015  . Lopid [gemfibrozil] Other (See Comments) 07/22/2015  . Loratadine Hives 07/22/2015  . Lorazepam Hives 06/10/2008  . Simvastatin Other (See Comments)   . Sulfamethoxazole Hives 07/22/2015  . Sulfonamide derivatives Hives     Family History  Problem Relation Age of Onset  . Diabetes Brother        deceased  . Heart disease Sister        A Fib  . Heart disease  Mother        CHF  . Atrial fibrillation Sister   . Cancer Father        lung cancer   . Cancer Maternal Uncle        unknown type cancer   . Cancer Other        lung cancer   . Colon cancer Neg Hx     Social History   Socioeconomic History  . Marital status: Married    Spouse name:  [name]  . Number of children: 5  . Years of education: Not on file  . Highest education level: Not on file  Occupational History  . Occupation: Arboriculturist: UNEMPLOYED  Social Needs  . Financial resource strain: Not on file  . Food insecurity:    Worry: Not on file    Inability: Not on file  . Transportation needs:    Medical: Not on file    Non-medical: Not on file  Tobacco Use  . Smoking status: Former Smoker    Packs/day: 1.00    Years: 50.00    Pack years: 50.00    Types: Cigarettes    Last attempt to quit: 11/05/2014    Years since quitting: 3.3  . Smokeless tobacco: Never Used  . Tobacco comment: Quit April 2016  Substance and Sexual Activity  . Alcohol use: No    Alcohol/week: 0.0 oz  . Drug use: No  . Sexual activity: Not on file  Lifestyle  . Physical activity:    Days per week: Not on file    Minutes per session: Not on file  . Stress: Not on file  Relationships  . Social connections:    Talks on phone: Not on file    Gets together: Not on file    Attends religious service: Not on file    Active member of club or organization: Not on file    Attends meetings of clubs or organizations: Not on file    Relationship status: Not on file  . Intimate partner violence:    Fear of current or ex partner: Not on file    Emotionally abused: Not on file    Physically abused: Not on file    Forced sexual activity: Not on file  Other Topics Concern  . Not on file  Social History Narrative  . Not on file    REVIEW OF SYSTEMS: Constitutional: Weakness, fatigue. ENT:  No nose bleeds Pulm: No cough.  Generally stable dyspnea on exertion. CV:  No palpitations or chest pain.  No lower extremity edema. GU: In the last week or so the patient's had smaller than usual volumes of urine and she describes dribbling when she attempts to urinate.  No change in the color of her urine.  No dysuria.  After receiving diuretics yesterday, she notes urine output has  increased. GI:  Per HPI Heme: Bruises fairly easily on her arms but otherwise no unusual bleeding or bruising. Transfusions: Received blood transfusion in February 2019 Neuro:  No headaches, no peripheral tingling or numbness MS: Right shoulder pain Derm:  No itching, no rash or sores.  Endocrine:  No sweats or chills.  No polyuria or dysuria Immunization:  Not queried.  S/p  Hep A/B vaccinations Travel:  None beyond local counties in last few months.    PHYSICAL EXAM: Vital signs in last 24 hours: Vitals:   02/26/18 0215 02/26/18 0511  BP: (!) 126/56 (!) 110/55  Pulse: 63 65  Resp:  18 18  Temp: 98.2 F (36.8 C) 97.8 F (36.6 C)  SpO2: 100% 100%   Wt Readings from Last 3 Encounters:  02/26/18 186 lb 15.2 oz (84.8 kg)  02/19/18 185 lb (83.9 kg)  01/27/18 192 lb (87.1 kg)    General: comfortable, obese, non-toxic.  alert Head: No issues #3 or facial swelling.  No signs of head trauma. Eyes: No scleral icterus.  No conjunctival pallor.  EOMI. Ears: Not hard of hearing Nose: No discharge, no congestion. Mouth: Oropharynx moist, clear.  Tongue midline. Neck: No JVD, no masses, no thyromegaly. Lungs: Clear bilaterally lung sounds a bit reduced on the right side.  No cough.  No dyspnea. Heart: RRR.  No MRG.  S1, S2 present. Abdomen: Obese.  Moderately tense.  Nontender.  No HSM, masses, bruits, hernias..   Rectal: Deferred Musc/Skeltl: right > left UE anasarca.  Right arm length shorter than left.  Some arthritic changes in the hands bilaterally.  Surgical scars in the right shoulder Extremities: No CCE. Neurologic: Asterixis.  Oriented to place, year, situation.  Slight psychomotor slowing most notable in word finding. Skin: No jaundice.  No telangiectasia. Tattoos: None Nodes: No cervical adenopathy. Psych: Pleasant, cooperative, calm.  Intake/Output from previous day: 07/23 0701 - 07/24 0700 In: 315 [Blood:315] Out: -  Intake/Output this shift: No intake/output data  recorded.  LAB RESULTS: Recent Labs    02/25/18 1538 02/26/18 0845  WBC 5.9 3.8*  HGB 7.5* 7.5*  HCT 24.0* 23.3*  PLT 92* PENDING   BMET Lab Results  Component Value Date   NA 133 (L) 02/26/2018   NA 133 (L) 02/25/2018   NA 134 (L) 02/19/2018   K 3.2 (L) 02/26/2018   K 3.6 02/25/2018   K 3.5 02/19/2018   CL 92 (L) 02/26/2018   CL 91 (L) 02/25/2018   CL 94 (L) 02/19/2018   CO2 30 02/26/2018   CO2 31 02/25/2018   CO2 34 (H) 02/19/2018   GLUCOSE 82 02/26/2018   GLUCOSE 137 (H) 02/25/2018   GLUCOSE 156 (H) 02/19/2018   BUN 65 (H) 02/26/2018   BUN 68 (H) 02/25/2018   BUN 58 (H) 02/19/2018   CREATININE 2.07 (H) 02/26/2018   CREATININE 2.21 (H) 02/25/2018   CREATININE 1.76 (H) 02/19/2018   CALCIUM 7.9 (L) 02/26/2018   CALCIUM 8.5 (L) 02/25/2018   CALCIUM 8.3 (L) 02/19/2018   LFT Recent Labs    02/25/18 1538  PROT 4.8*  ALBUMIN 2.5*  AST 27  ALT 20  ALKPHOS 137*  BILITOT 1.4*  BILIDIR 0.4*  IBILI 1.0*   PT/INR Lab Results  Component Value Date   INR 1.14 02/25/2018   INR 1.17 01/20/2018   INR 1.1 01/15/2018   PROTIME 12.0 07/10/2017   Hepatitis Panel No results for input(s): HEPBSAG, HCVAB, HEPAIGM, HEPBIGM in the last 72 hours. C-Diff No components found for: CDIFF Lipase     Component Value Date/Time   LIPASE 84 (H) 09/07/2017 1428    Drugs of Abuse  No results found for: LABOPIA, COCAINSCRNUR, LABBENZ, AMPHETMU, THCU, LABBARB   RADIOLOGY STUDIES: Dg Chest 2 View  Result Date: 02/25/2018 CLINICAL DATA:  Short of breath on exertion EXAM: CHEST - 2 VIEW COMPARISON:  12/25/2017, 09/27/2017 FINDINGS: Postsurgical changes in the cervical spine. Tiny pleural effusions. No focal consolidation. Borderline to mild cardiomegaly. Aortic atherosclerosis. No pneumothorax. Treated compression deformity of the lower spine. Right shoulder replacement. IMPRESSION: Tiny pleural effusions.  Stable borderline to mild cardiomegaly. Electronically Signed  By: Donavan Foil M.D.   On: 02/25/2018 21:10    IMPRESSION:   *   Decompensated cirrhosis.  Child's 9 to 10 (depends on level of ascites found at para today) which is a stable score.    *   Hepatic encephalopathy.  Increased asterixis, slower mentation in last 2 weeks.  Generally compliant with lactulose.  Was unable to afford rifaximin.  *   Ascites.  Despite increased Aldactone, stable Torsemide.   For repeat paracentesis and Cell count today.  With CKD and now AKI, may not tolerate increased diuretics.    *    Anemia.  Acute on chronic.  GIB in 09/2017.  Found grade 1 esophageal varices and moderate portal gastropathy.  BB at home is Bisoprolol which is selective.   Transfused 1 U PRBCs during admission late 01/2018.  On chronic oral iron  *   Hypokalemia.    *   AKI.    *   Hx HCC, ablated 08/2017.  No new lesions on 12/2017 MRI.     PLAN:     *  Dr Fuller Plan will see pt.  For now Aldactone and Torsemide on hold.  Zarolxolyn daly (was using prn).   Of all her diuretics the Aldactone is probably the most useful for ascites management so we may need to ditch the Zaroxolyn and restart lower dose of Aldactone.  *   In the future should we consider stopping her bisoprolol and starting propanolol versus carvedilol as the latter 2 are nonselective beta-blockers and specifically indicated for prevention of variceal bleeding?   Azucena Freed  02/26/2018, 10:27 AM Phone [phone]      Attending physician's note   I have taken a history, examined the patient and reviewed the chart. I agree with the Advanced Practitioner's note, impression and recommendations. Decompensated cirrhosis with refractory ascites. Agree with plans for LVP. Torsemide (or Lasix) and Aldactone would the preferred combination for ascites mgmt. Consider stopping beta blocker and adding midodrine.   Lucio Edward, MD FACG [phone] office

## 2018-02-26 NOTE — ED Notes (Signed)
Attempted report x1. 

## 2018-02-26 NOTE — Progress Notes (Signed)
PROGRESS NOTE  [name] GLO:[id number] DOB: [birth year] DOA: 02/25/2018 PCP: Unk Pinto, MD  HPI/Recap of past 24 hours:  Denies pain, no fever, no n/v She reports progressive generalized weakness and recurrent ascites She reports right upper extremity edema for 4-5 days  Assessment/Plan: Principal Problem:   Liver cirrhosis secondary to nonalcoholic steatohepatitis (NASH) ([place]) Active Problems:   Symptomatic anemia   Esophageal varices (HCC)   Essential hypertension   Chronic respiratory failure (HCC)   Ascites   Chronic diastolic CHF (congestive heart failure) (HCC)   CKD (chronic kidney disease), stage III ([place])  Decompensated cirrhosis with Recurrent ascites: IR for therapeutic para with fluids study Gi consulted  Worsening on renal function Hepatorenal? Baseline cr 1.2-1.3 Cr 2.21 on presentation ua with trace bacteria, trace leuk, urine culture pending Renal dosing meds  Hepatic encephalopathy: Slower mentation but oriented x3, continue lactulose, not able to afford rifazimin  Acute on chronic anemia, macrocytic With h/o gi bleed in 09/2017. H/o grade 1 esophageal varices and moderate portal gastropathy She received prbc transfusion on admission this tim fobt Transfuse to keep hgb >7  Thrombocytopenia: likely from cirrhosis Monitor    Right upper extremity edema: Venous US  FTT: recurrent hospitalization, now reports progressive weakness PT eval Palliative care consult  Stage I sacral decubitus ulcer, presented on admission  D/c rifampin, decrease celexa from bid to once a day  Code Status: partial code  Family Communication: patient   Disposition Plan: not ready to discharge   Consultants:  LBGI  IR  Palliative care  Procedures:  IR paracentesis  Antibiotics:  *   Objective: BP (!) 110/55 (BP Location: Left Arm)   Pulse 65   Temp 97.8 F (36.6 C) (Oral)   Resp 18   Ht 5' 2"  (1.575 m)   Wt 84.8 kg (186 lb 15.2  oz)   SpO2 100%   BMI 34.19 kg/m   Intake/Output Summary (Last 24 hours) at 02/26/2018 0946 Last data filed at 02/26/2018 0215 Gross per 24 hour  Intake 315 ml  Output -  Net 315 ml   Filed Weights   02/26/18 0145 02/26/18 0511  Weight: 88 kg (194 lb 0.1 oz) 84.8 kg (186 lb 15.2 oz)    Exam: Patient is examined daily including today on 02/26/2018, exams remain the same as of yesterday except that has changed    General:  Frail, pale, chronically ill, NAD  Cardiovascular: RRR  Respiratory: diminished at basis, no wheezing, no rales, no rhonchi  Abdomen: distended, nontender, positive BS  Musculoskeletal: right upper extremity pitting Edema  Neuro: alert, oriented x4  Data Reviewed: Basic Metabolic Panel: Recent Labs  Lab 02/19/18 1447 02/25/18 1538  NA 134* 133*  K 3.5 3.6  CL 94* 91*  CO2 34* 31  GLUCOSE 156* 137*  BUN 58* 68*  CREATININE 1.76* 2.21*  CALCIUM 8.3* 8.5*   Liver Function Tests: Recent Labs  Lab 02/25/18 1538  AST 27  ALT 20  ALKPHOS 137*  BILITOT 1.4*  PROT 4.8*  ALBUMIN 2.5*   No results for input(s): LIPASE, AMYLASE in the last 168 hours. Recent Labs  Lab 02/19/18 1447 02/25/18 2113  AMMONIA 71* 46*   CBC: Recent Labs  Lab 02/19/18 1447 02/25/18 1538 02/26/18 0845  WBC 5.7 5.9 3.8*  NEUTROABS 4.4  --   --   HGB 8.0 Repeated and verified X2.* 7.5* 7.5*  HCT 24.0* 24.0* 23.3*  MCV 104.4* 111.6* 105.4*  PLT 87.0* 92* PENDING  Cardiac Enzymes:   No results for input(s): CKTOTAL, CKMB, CKMBINDEX, TROPONINI in the last 168 hours. BNP (last 3 results) Recent Labs    09/08/17 1107 09/16/17 1253 02/25/18 1538  BNP 144.3* 157.0* 283.7*    ProBNP (last 3 results) No results for input(s): PROBNP in the last 8760 hours.  CBG: Recent Labs  Lab 02/25/18 1607  GLUCAP 119*    No results found for this or any previous visit (from the past 240 hour(s)).   Studies: Dg Chest 2 View  Result Date: 02/25/2018 CLINICAL  DATA:  Short of breath on exertion EXAM: CHEST - 2 VIEW COMPARISON:  12/25/2017, 09/27/2017 FINDINGS: Postsurgical changes in the cervical spine. Tiny pleural effusions. No focal consolidation. Borderline to mild cardiomegaly. Aortic atherosclerosis. No pneumothorax. Treated compression deformity of the lower spine. Right shoulder replacement. IMPRESSION: Tiny pleural effusions.  Stable borderline to mild cardiomegaly. Electronically Signed   By: Donavan Foil M.D.   On: 02/25/2018 21:10    Scheduled Meds: . aspirin EC  81 mg Oral QHS  . bisoprolol  10 mg Oral Daily  . cholecalciferol  5,000 Units Oral Q breakfast  . citalopram  40 mg Oral BID  . ferrous sulfate  325 mg Oral TID WC  . gabapentin  600 mg Oral TID  . magnesium oxide  400 mg Oral QHS  . metolazone  5 mg Oral Daily  . rifampin  300 mg Oral BID  . sodium chloride flush  3 mL Intravenous Q12H  . vitamin C  500 mg Oral TID    Continuous Infusions: . sodium chloride       Time spent: 61mns I have personally reviewed and interpreted on  02/26/2018 daily labs, tele strips, imagings as discussed above under date review session and assessment and plans.  I reviewed all nursing notes, pharmacy notes, consultant notes,  vitals, pertinent old records  I have discussed plan of care as described above with RN , patient  on 02/26/2018   FFlorencia ReasonsMD, PhD  [facility] Pager [phone] If 7PM-7AM, please contact night-coverage at [email or web address], password [email or web address]/24/2019, 9:46 AM  LOS: 1 day

## 2018-02-26 NOTE — Progress Notes (Addendum)
Pharmacy - PTA medications  Questioned patient taking rifampin without indication. Also taking citalopram 40 mg PO bid due to rifampin being an inducer. Husband confirms she is taking both.  Patient was not able to afford rifaximin for liver cirrhosis. GI note from 12/2017 states to d/c rifampin but unsure if this was communicated to patient and husband.   Updated Dr. Erlinda Hong. Rifampin has been discontinued. Citalopram dose changed to 40 mg PO daily. Please continue these changes on discharge.  Updated husband to make these changes also.  Renold Genta, PharmD, BCPS Clinical Pharmacist Clinical phone for 02/26/2018 until 3p is [phone] Please check AMION for all Pharmacist numbers by unit 02/26/2018 11:13 AM

## 2018-02-27 ENCOUNTER — Inpatient Hospital Stay (HOSPITAL_COMMUNITY): Payer: PPO

## 2018-02-27 ENCOUNTER — Encounter (HOSPITAL_COMMUNITY): Payer: Self-pay | Admitting: Student

## 2018-02-27 DIAGNOSIS — L8990 Pressure ulcer of unspecified site, unspecified stage: Secondary | ICD-10-CM

## 2018-02-27 HISTORY — PX: IR PARACENTESIS: IMG2679

## 2018-02-27 LAB — COMPREHENSIVE METABOLIC PANEL
ALK PHOS: 119 U/L (ref 38–126)
ALT: 18 U/L (ref 0–44)
ANION GAP: 11 (ref 5–15)
AST: 20 U/L (ref 15–41)
Albumin: 2.3 g/dL — ABNORMAL LOW (ref 3.5–5.0)
BUN: 68 mg/dL — ABNORMAL HIGH (ref 8–23)
CALCIUM: 8 mg/dL — AB (ref 8.9–10.3)
CO2: 30 mmol/L (ref 22–32)
Chloride: 93 mmol/L — ABNORMAL LOW (ref 98–111)
Creatinine, Ser: 2.26 mg/dL — ABNORMAL HIGH (ref 0.44–1.00)
GFR, EST AFRICAN AMERICAN: 24 mL/min — AB (ref >60)
GFR, EST NON AFRICAN AMERICAN: 21 mL/min — AB (ref >60)
Glucose, Bld: 89 mg/dL (ref 70–99)
Potassium: 3.3 mmol/L — ABNORMAL LOW (ref 3.5–5.1)
SODIUM: 134 mmol/L — AB (ref 135–145)
Total Bilirubin: 1.1 mg/dL (ref 0.3–1.2)
Total Protein: 4.1 g/dL — ABNORMAL LOW (ref 6.5–8.1)

## 2018-02-27 LAB — BODY FLUID CELL COUNT WITH DIFFERENTIAL
EOS FL: NONE SEEN %
Lymphs, Fluid: 81 %
Monocyte-Macrophage-Serous Fluid: 15 % — ABNORMAL LOW (ref 50–90)
NEUTROPHIL FLUID: 4 % (ref 0–25)
Total Nucleated Cell Count, Fluid: 85 cu mm (ref 0–1000)

## 2018-02-27 LAB — CBC
HEMATOCRIT: 24.3 % — AB (ref 36.0–46.0)
Hemoglobin: 7.6 g/dL — ABNORMAL LOW (ref 12.0–15.0)
MCH: 33.2 pg (ref 26.0–34.0)
MCHC: 31.3 g/dL (ref 30.0–36.0)
MCV: 106.1 fL — AB (ref 78.0–100.0)
Platelets: 74 10*3/uL — ABNORMAL LOW (ref 150–400)
RBC: 2.29 MIL/uL — ABNORMAL LOW (ref 3.87–5.11)
RDW: 21.4 % — ABNORMAL HIGH (ref 11.5–15.5)
WBC: 4.4 10*3/uL (ref 4.0–10.5)

## 2018-02-27 LAB — GLUCOSE, PLEURAL OR PERITONEAL FLUID: GLUCOSE FL: 112 mg/dL

## 2018-02-27 LAB — PROTEIN, PLEURAL OR PERITONEAL FLUID: Total protein, fluid: <3 g/dL

## 2018-02-27 LAB — GRAM STAIN

## 2018-02-27 LAB — AMMONIA: AMMONIA: 72 umol/L — AB (ref 9–35)

## 2018-02-27 MED ORDER — LIDOCAINE HCL (PF) 1 % IJ SOLN
INTRAMUSCULAR | Status: DC | PRN
  Administered 2018-02-27: 8 mL

## 2018-02-27 MED ORDER — FERROUS SULFATE 325 (65 FE) MG PO TABS
325.0000 mg | ORAL_TABLET | Freq: Two times a day (BID) | ORAL | Status: DC
  Administered 2018-02-27 – 2018-03-01 (×4): 325 mg via ORAL
  Filled 2018-02-27 (×4): qty 1

## 2018-02-27 MED ORDER — LACTULOSE 10 GM/15ML PO SOLN
20.0000 g | Freq: Two times a day (BID) | ORAL | Status: DC
  Administered 2018-02-27: 10 g via ORAL
  Administered 2018-02-27 – 2018-02-28 (×3): 20 g via ORAL
  Filled 2018-02-27 (×5): qty 30

## 2018-02-27 MED ORDER — PROPRANOLOL HCL 10 MG PO TABS
20.0000 mg | ORAL_TABLET | Freq: Two times a day (BID) | ORAL | Status: DC
  Administered 2018-02-27: 20 mg via ORAL
  Filled 2018-02-27: qty 2

## 2018-02-27 MED ORDER — LIDOCAINE HCL (PF) 2 % IJ SOLN
INTRAMUSCULAR | Status: AC
  Filled 2018-02-27: qty 20

## 2018-02-27 MED ORDER — POTASSIUM CHLORIDE CRYS ER 20 MEQ PO TBCR
40.0000 meq | EXTENDED_RELEASE_TABLET | Freq: Once | ORAL | Status: AC
  Administered 2018-02-27: 40 meq via ORAL
  Filled 2018-02-27: qty 2

## 2018-02-27 MED ORDER — MIDODRINE HCL 5 MG PO TABS
5.0000 mg | ORAL_TABLET | Freq: Three times a day (TID) | ORAL | Status: DC
  Administered 2018-02-27 – 2018-03-01 (×6): 5 mg via ORAL
  Filled 2018-02-27 (×6): qty 1

## 2018-02-27 MED ORDER — ALBUMIN HUMAN 25 % IV SOLN
25.0000 g | Freq: Once | INTRAVENOUS | Status: AC
  Administered 2018-02-27: 25 g via INTRAVENOUS
  Filled 2018-02-27 (×2): qty 50

## 2018-02-27 NOTE — Progress Notes (Signed)
PROGRESS NOTE  DARREN NODAL RKY:[id number] DOB: [birth year] DOA: 02/25/2018 PCP: Unk Pinto, MD  HPI/Recap of past 24 hours:  Report feeling stronger today, able to get up from bed Shakiness has much improved She c/o right shoulder pain  Assessment/Plan: Principal Problem:   Liver cirrhosis secondary to nonalcoholic steatohepatitis (NASH) ([place]) Active Problems:   Symptomatic anemia   Esophageal varices (HCC)   Essential hypertension   Chronic respiratory failure (HCC)   Ascites   Chronic diastolic CHF (congestive heart failure) (HCC)   CKD (chronic kidney disease), stage III (HCC)   Pressure injury of skin  Decompensated cirrhosis with Recurrent ascites: -H/o grade 1 esophageal varices and moderate portal gastropathy -IR for therapeutic para with 500 cc fluids removed,  fluids is cloudy, wbc 85. She received albumin infusion after thoracentesis today per gi recommendation. -She is started on midodrine today per gi recommendation, betablocker on hold -Gi consulted input appreciated   AKI on CKDIII Worsening on renal function Hepatorenal? Baseline cr 1.2-1.3 Cr 2.21 on presentation ua with trace bacteria, trace leuk, urine culture >100,000 enterococcus abium, case discussed with infectious disease Dr Linus Salmons who recommend this likely asymptomatic bacteriuria, do not recommend abx unless patient has fever or leukocytosis Renal dosing meds  Hepatic encephalopathy: Slower mentation but oriented x3, continue lactulose, not able to afford rifaximin She reports reason for admission due to she was "shaky so bad that she could not walk" She has significant asterixis on presentation, this has improved today. She actually could get up from bed and used her walk to get to the chair this am  H/o  Stage 2 HCC, s/p IR ablation x 2 (lesions in left and right lobes) in 08/2017 MRI 12/2017 with ablation related defects, no new lesions.    Acute on chronic anemia, macrocytic With  h/o gi bleed in 09/2017. H/o grade 1 esophageal varices and moderate portal gastropathy She received prbc transfusion on admission this tim fobt Transfuse to keep hgb >7  Thrombocytopenia: likely from cirrhosis Monitor    Right upper extremity edema: Venous US negative for DVT but  limited due to shoulder pain Patient has right shoulder surgery when she was [age] weeks old, right arm hypoplasia  She reports chronic  intermittent right arm edema, chronic limited range of motion on right shoulder, but the pain is new, she also reports neck pain Will get cervical spine x ray and right shoulder x ray Consider lymphaedema wrap If x ray no acute findings  FTT: recurrent hospitalization, reports progressive weakness PT eval Palliative care consult  Stage I sacral decubitus ulcer, presented on admission  Depression,  decrease celexa from bid to once a day  Code Status: partial code  Family Communication: patient   Disposition Plan: need gi clearance for discharge,    Consultants:  [facility]I  IR  Palliative care  Phone conversation with Infectious disease on 7/25  Procedures:  IR paracentesis on 7/25  Antibiotics:  none   Objective: BP (!) 104/42 (BP Location: Left Arm)   Pulse 62   Temp 97.9 F (36.6 C) (Oral)   Resp (!) 22   Ht 5' 2"  (1.575 m)   Wt 84.8 kg (186 lb 15.2 oz)   SpO2 94%   BMI 34.19 kg/m   Intake/Output Summary (Last 24 hours) at 02/27/2018 0741 Last data filed at 02/26/2018 1852 Gross per 24 hour  Intake 120 ml  Output 1000 ml  Net -880 ml   Filed Weights   02/26/18 0145 02/26/18 0511  Weight: 88 kg (194 lb 0.1 oz) 84.8 kg (186 lb 15.2 oz)    Exam: Patient is examined daily including today on 02/27/2018, exams remain the same as of yesterday except that has changed    General:  Frail, pale, chronically ill, NAD  Cardiovascular: RRR  Respiratory: diminished at basis, no wheezing, no rales, no rhonchi  Abdomen: distended, nontender,  positive BS  Musculoskeletal: right upper extremity pitting Edema, right arm hypoplasia   Neuro: alert, oriented x4  Data Reviewed: Basic Metabolic Panel: Recent Labs  Lab 02/25/18 1538 02/26/18 0845 02/27/18 0541  NA 133* 133* 134*  K 3.6 3.2* 3.3*  CL 91* 92* 93*  CO2 31 30 30   GLUCOSE 137* 82 89  BUN 68* 65* 68*  CREATININE 2.21* 2.07* 2.26*  CALCIUM 8.5* 7.9* 8.0*   Liver Function Tests: Recent Labs  Lab 02/25/18 1538 02/27/18 0541  AST 27 20  ALT 20 18  ALKPHOS 137* 119  BILITOT 1.4* 1.1  PROT 4.8* 4.1*  ALBUMIN 2.5* 2.3*   No results for input(s): LIPASE, AMYLASE in the last 168 hours. Recent Labs  Lab 02/25/18 2113  AMMONIA 46*   CBC: Recent Labs  Lab 02/25/18 1538 02/26/18 0845 02/27/18 0541  WBC 5.9 3.8* 4.4  HGB 7.5* 7.5* 7.6*  HCT 24.0* 23.3* 24.3*  MCV 111.6* 105.4* 106.1*  PLT 92* 72* 74*   Cardiac Enzymes:   No results for input(s): CKTOTAL, CKMB, CKMBINDEX, TROPONINI in the last 168 hours. BNP (last 3 results) Recent Labs    09/08/17 1107 09/16/17 1253 02/25/18 1538  BNP 144.3* 157.0* 283.7*    ProBNP (last 3 results) No results for input(s): PROBNP in the last 8760 hours.  CBG: Recent Labs  Lab 02/25/18 1607  GLUCAP 119*    Recent Results (from the past 240 hour(s))  Urine culture     Status: Abnormal (Preliminary result)   Collection Time: 02/25/18  5:06 PM  Result Value Ref Range Status   Specimen Description URINE, RANDOM  Final   Special Requests NONE  Final   Culture (A)  Final    >=100,000 COLONIES/mL UNIDENTIFIED ORGANISM Performed at [facility], [place] [address], [place], [place] [address]    Report Status PENDING  Incomplete     Studies: No results found.  Scheduled Meds: . aspirin EC  81 mg Oral QHS  . bisoprolol  10 mg Oral Daily  . cholecalciferol  5,000 Units Oral Q breakfast  . citalopram  40 mg Oral Daily  . ferrous sulfate  325 mg Oral TID WC  . gabapentin  600 mg Oral TID  .  magnesium oxide  400 mg Oral QHS  . metolazone  5 mg Oral Daily  . potassium chloride  40 mEq Oral Once  . sodium chloride flush  3 mL Intravenous Q12H  . vitamin C  500 mg Oral TID    Continuous Infusions: . sodium chloride       Time spent: 48mns I have personally reviewed and interpreted on  02/27/2018 daily labs, tele strips, imagings as discussed above under date review session and assessment and plans.  I reviewed all nursing notes, pharmacy notes, consultant notes,  vitals, pertinent old records  I have discussed plan of care as described above with RN , patient  on 02/27/2018   FFlorencia ReasonsMD, PhD  [facility] Pager [phone] If 7PM-7AM, please contact night-coverage at [email or web address], password [facility]7/25/2019, 7:41 AM  LOS: 2 days

## 2018-02-27 NOTE — Evaluation (Signed)
Physical Therapy Evaluation Patient Details Name: [name] MRN: [id number] DOB: 10-13-1945 Today's Date: 02/27/2018   History of Present Illness  Patient is a 72 y/o female presenting to the ED on 02/25/18 with swelling/increased abdominal girth and SOB. Admitted with advanced liver cirrhosis secondary to nonalcoholic steatohepatitis (NASH) with planned paracentesis. PMH significant for chronic kidney disease, COPD, advanced cirrhosis of the liver due to [facility], hypertension.    Clinical Impression  [name] is a very pleasant 72 y/o female admitted with the above listed diagnosis. Patient reports that prior to admission she lived at home with her husband who provided assist for ADLs and mobility. Patient today requiring Min A for bed mobility and tranfers with patient limited secondary to fatigue. Currently recommending HHPT at discharge to continue to progress safe functional mobility in the home. PT to continue to follow acutely.    Follow Up Recommendations Home health PT;Supervision/Assistance - 24 hour    Equipment Recommendations  None recommended by PT(has equipment at home)    Recommendations for Other Services       Precautions / Restrictions Precautions Precautions: Fall Precaution Comments: O2 dependent Restrictions Weight Bearing Restrictions: No      Mobility  Bed Mobility Overal bed mobility: Needs Assistance Bed Mobility: Supine to Sit     Supine to sit: Min assist;HOB elevated     General bed mobility comments: HOB elevated; use of bed rail as well as PT for upright trunk control  Transfers Overall transfer level: Needs assistance Equipment used: Rolling walker (2 wheeled) Transfers: Sit to/from [facility] Sit to Stand: Min assist Stand pivot transfers: Min guard       General transfer comment: Min A to power up at bedside; min guard and verbal cueing throughout transfer for safety and hand placement  Ambulation/Gait                Stairs            Wheelchair Mobility    Modified Rankin (Stroke Patients Only)       Balance Overall balance assessment: Mild deficits observed, not formally tested                                           Pertinent Vitals/Pain Pain Assessment: Faces Faces Pain Scale: Hurts little more Pain Location: generalized Pain Descriptors / Indicators: Grimacing Pain Intervention(s): Limited activity within patient's tolerance;Monitored during session;Repositioned    Home Living Family/patient expects to be discharged to:: Private residence Living Arrangements: Spouse/significant other Available Help at Discharge: Family;Available 24 hours/day Type of Home: House Home Access: Stairs to enter Entrance Stairs-Rails: None("I hold onto my husband") Entrance Stairs-Number of Steps: 2 Home Layout: One level Home Equipment: Walker - 2 wheels;Bedside commode;Shower seat;Transport chair      Prior Function Level of Independence: Needs assistance   Gait / Transfers Assistance Needed: reports she uses RW at home, but will sometimes use WC as well   ADL's / Homemaking Assistance Needed: husband asssit with ADLs and intermittent with mobility  Comments: RW for household distances     Hand Dominance   Dominant Hand: Right    Extremity/Trunk Assessment        Lower Extremity Assessment Lower Extremity Assessment: Generalized weakness    Cervical / Trunk Assessment Cervical / Trunk Assessment: Kyphotic  Communication   Communication: No difficulties  Cognition Arousal/Alertness: Awake/alert Behavior  During Therapy: WFL for tasks assessed/performed Overall Cognitive Status: Within Functional Limits for tasks assessed                                        General Comments      Exercises     Assessment/Plan    PT Assessment Patient needs continued PT services  PT Problem List Decreased strength;Decreased activity  tolerance;Decreased balance;Decreased mobility;Decreased knowledge of use of DME;Cardiopulmonary status limiting activity       PT Treatment Interventions DME instruction;Gait training;Stair training;Functional mobility training;Therapeutic activities;Therapeutic exercise;Patient/family education    PT Goals (Current goals can be found in the Care Plan section)  Acute Rehab PT Goals Patient Stated Goal: "I want to go home tomorrow" PT Goal Formulation: With patient Time For Goal Achievement: 03/13/18 Potential to Achieve Goals: Good    Frequency Min 3X/week   Barriers to discharge        Co-evaluation               AM-PAC PT "6 Clicks" Daily Activity  Outcome Measure Difficulty turning over in bed (including adjusting bedclothes, sheets and blankets)?: A Lot Difficulty moving from lying on back to sitting on the side of the bed? : Unable Difficulty sitting down on and standing up from a chair with arms (e.g., wheelchair, bedside commode, etc,.)?: Unable Help needed moving to and from a bed to chair (including a wheelchair)?: A Little Help needed walking in hospital room?: A Little Help needed climbing 3-5 steps with a railing? : A Lot 6 Click Score: 12    End of Session Equipment Utilized During Treatment: Gait belt;Oxygen Activity Tolerance: Patient limited by fatigue Patient left: in chair;with call bell/phone within reach;with chair alarm set;with nursing/sitter in room Nurse Communication: Mobility status PT Visit Diagnosis: Unsteadiness on feet (R26.81);Other abnormalities of gait and mobility (R26.89);Muscle weakness (generalized) (M62.81)    Time: 6384-5364 PT Time Calculation (min) (ACUTE ONLY): 21 min   Charges:   PT Evaluation $PT Eval Moderate Complexity: 1 Mod          Lanney Gins, PT, DPT 02/27/18 1:34 PM Pager: [phone]

## 2018-02-27 NOTE — Care Management Note (Signed)
Case Management Note  Patient Details  Name: [name] MRN: [id number] Date of Birth: 1946/03/28  Subjective/Objective:      Admitted with swelling/increased abdominal girth and SOB, hx of advanced liver cirrhosis secondary to nonalcoholic steatohepatitis (NASH). From home with husband, [name]. PTA pt recently completed home health PT services with [facility].           7/25 s/p US guided paracentesis from left lateral abdomen, yielded 500 mL .   [name] (Spouse)     [phone]      PCP: Unk Pinto  Action/Plan: Per PT's evaluation:  Recommendations Home health PT;Supervision/Assistance - 24 hour  NCM went to discuss recommendations with pt @ bedside. Pt was asleep.Marland KitchenMarland KitchenNCM to f/u with pt regarding transitional care needs.  Expected Discharge Date:                  Expected Discharge Plan:     In-House Referral:     Discharge planning Services  CM Consult  Post Acute Care Choice:    Choice offered to:     DME Arranged:    DME Agency:     HH Arranged:    HH Agency:    [facility] Status of Service:  In process, will continue to follow  If discussed at Long Length of Stay Meetings, dates discussed:    Additional Comments:  Sharin Mons, RN 02/27/2018, 2:08 PM

## 2018-02-27 NOTE — Procedures (Signed)
PROCEDURE SUMMARY:  Successful US guided paracentesis from left lateral abdomen.  Yielded 500 mL of yellow fluid.  No immediate complications.  Pt tolerated well.   Specimen was sent for labs.  Docia Barrier PA-C 02/27/2018 9:47 AM

## 2018-02-27 NOTE — Progress Notes (Addendum)
Daily Rounding Note  02/27/2018, 9:38 AM  LOS: 2 days   SUBJECTIVE:   Chief complaint: actually it is right shoulder and arm swelling and pain.      No paracentesis yet, apparently there was scheduling conflict with vascular study.   No DVT in RUE on a pain- limited study.   500 CC paracentesis this AM.   1 liter measured/charted urine yesterday, no recorded yet today.     OBJECTIVE:         Vital signs in last 24 hours:    Temp:  [97.9 F (36.6 C)-98.5 F (36.9 C)] 97.9 F (36.6 C) (07/25 0602) Pulse Rate:  [59-63] 62 (07/25 0602) Resp:  [18-22] 22 (07/25 0602) BP: (101-131)/(42-88) 116/52 (07/25 0928) SpO2:  [94 %-95 %] 94 % (07/25 0602) Last BM Date: 02/26/18 Filed Weights   02/26/18 0145 02/26/18 0511  Weight: 194 lb 0.1 oz (88 kg) 186 lb 15.2 oz (84.8 kg)   General: pleasant, alert, eating a sandwich  Heart: RRR Chest: clear bil.  No dyspnea or cough Abdomen: still moderately tense, NT, active BS  Extremities: pitting edema in entire right arm  Neuro/Psych:  slight psycho-motor slowing and delayed speach.  Asterixis  Intake/Output from previous day: 07/24 0701 - 07/25 0700 In: 120 [P.O.:120] Out: 1000 [Urine:1000]  Intake/Output this shift: Total I/O In: -  Out: 500 [Other:500]  Lab Results: Recent Labs    02/25/18 1538 02/26/18 0845 02/27/18 0541  WBC 5.9 3.8* 4.4  HGB 7.5* 7.5* 7.6*  HCT 24.0* 23.3* 24.3*  PLT 92* 72* 74*   BMET Recent Labs    02/25/18 1538 02/26/18 0845 02/27/18 0541  NA 133* 133* 134*  K 3.6 3.2* 3.3*  CL 91* 92* 93*  CO2 31 30 30   GLUCOSE 137* 82 89  BUN 68* 65* 68*  CREATININE 2.21* 2.07* 2.26*  CALCIUM 8.5* 7.9* 8.0*   LFT Recent Labs    02/25/18 1538 02/27/18 0541  PROT 4.8* 4.1*  ALBUMIN 2.5* 2.3*  AST 27 20  ALT 20 18  ALKPHOS 137* 119  BILITOT 1.4* 1.1  BILIDIR 0.4*  --   IBILI 1.0*  --    PT/INR Recent Labs    02/25/18 2113  LABPROT  14.5  INR 1.14   Hepatitis Panel No results for input(s): HEPBSAG, HCVAB, HEPAIGM, HEPBIGM in the last 72 hours.  Studies/Results: Dg Chest 2 View  Result Date: 02/25/2018 CLINICAL DATA:  Short of breath on exertion EXAM: CHEST - 2 VIEW COMPARISON:  12/25/2017, 09/27/2017 FINDINGS: Postsurgical changes in the cervical spine. Tiny pleural effusions. No focal consolidation. Borderline to mild cardiomegaly. Aortic atherosclerosis. No pneumothorax. Treated compression deformity of the lower spine. Right shoulder replacement. IMPRESSION: Tiny pleural effusions.  Stable borderline to mild cardiomegaly. Electronically Signed   By: Donavan Foil M.D.   On: 02/25/2018 21:10   Scheduled Meds: . aspirin EC  81 mg Oral QHS  . bisoprolol  10 mg Oral Daily  . cholecalciferol  5,000 Units Oral Q breakfast  . citalopram  40 mg Oral Daily  . ferrous sulfate  325 mg Oral TID WC  . gabapentin  600 mg Oral TID  . lidocaine      . magnesium oxide  400 mg Oral QHS  . metolazone  5 mg Oral Daily  . sodium chloride flush  3 mL Intravenous Q12H  . vitamin C  500 mg Oral TID   Continuous Infusions: . sodium  chloride     PRN Meds:.sodium chloride, albuterol, cyclobenzaprine, ipratropium, lidocaine (PF), ondansetron **OR** ondansetron (ZOFRAN) IV, ondansetron, sodium chloride flush, traMADol   ASSESMENT:   *   Decompensated cirrhosis.  Child's 9.    *   Ascites.  Despite increased Aldactone, stable Torsemide PTA.  Now off all diuretics due to AKI.   500 mL para today, cell count and diff pending.  Has not had SBP or prior taps.   Spoke with PA from IR.  The patient has very shallow diffuse ascites which makes drainage challenging.  They noted that there was some residual ascites but it was not a significant volume.  *   Hepatic encephalopathy.  Increased asterixis, slower mentation in last 2 weeks.  Generally compliant with lactulose but has not been receiving as inpt.  Was unable to afford rifaximin. 1  stool yesterday.  Not receiving lactulose  *  Pleural effusion and thoracentesis 09/2017. ? Hepatic hydrothorax?  *    Anemia.  Acute on chronic.  On TID iron at home and now.  Hgb stable but low.  FOBT + 7/24 but no overt melena, hematochezia.   GIB in 09/2017. EGD with grade 1 esophageal varices and moderate portal gastropathy.  BB at home is Bisoprolol which is selective.   Transfused 1 U PRBCs during admission late 01/2018.  On chronic oral iron  *  Right shoulder, arm pain.  Suboptimal doppler study did not show DVT.  Dr Erlinda Hong going to order CT.    *   Hypokalemia.  Persists.    *   AKI. On background of CKD stage 3.     *   Hx HCC, ablated 08/2017.  No new lesions on 12/2017 MRI.     PLAN   *   I ordered a single dose of IV albumin to be given now.  I think this is prudent given that she has the AKI and just had paracentesis, albeit not a lot of volume was removed.  *  Await cell count and diff.    *  When is safe to restart diuretics?  If we do, given hypokalemia, would start with low dose Aldactone (may help retain potassium).  Will make switch Bisoprolol (received today) to propanolol 20 mg BID starting tomorrow (preferable for pt to prevent variceal bleeding).    Restarted BID Lactulose and dropped iron to BID.    *   AM BMET, CBC.     [name]  02/27/2018, 9:38 AM Phone [phone]     Attending physician's note   I have taken an interval history, reviewed the chart and examined the patient. I agree with the Advanced Practitioner's note, impression and recommendations.  Decompensated NASH cirrhosis with refractory ascites and renal insufficiency.  500 cc paracentesis today. IV albumin today. Given renal insufficiency would not use a betablocker as it can impair renal funtion. Add Midodrine 5 mg po tid to help improve renal function.  Lucio Edward, MD FACG [phone] office

## 2018-02-27 NOTE — Progress Notes (Signed)
Palliative:  [name] is sleeping. I called and spoke with her husband who will return to the hospital tomorrow. I plan to meet with them together 02/28/18 ~9-9:30 am. Thank you for this consult.   No charge  Vinie Sill, NP Palliative Medicine Team Pager # [phone] (M-F 8a-5p) Team Phone # [phone] (Nights/Weekends)

## 2018-02-28 DIAGNOSIS — Z515 Encounter for palliative care: Secondary | ICD-10-CM

## 2018-02-28 DIAGNOSIS — N183 Chronic kidney disease, stage 3 (moderate): Secondary | ICD-10-CM

## 2018-02-28 DIAGNOSIS — Z7189 Other specified counseling: Secondary | ICD-10-CM

## 2018-02-28 DIAGNOSIS — R188 Other ascites: Secondary | ICD-10-CM

## 2018-02-28 LAB — URINE CULTURE

## 2018-02-28 LAB — BASIC METABOLIC PANEL
ANION GAP: 10 (ref 5–15)
BUN: 63 mg/dL — ABNORMAL HIGH (ref 8–23)
CHLORIDE: 96 mmol/L — AB (ref 98–111)
CO2: 32 mmol/L (ref 22–32)
Calcium: 8.4 mg/dL — ABNORMAL LOW (ref 8.9–10.3)
Creatinine, Ser: 1.83 mg/dL — ABNORMAL HIGH (ref 0.44–1.00)
GFR calc non Af Amer: 27 mL/min — ABNORMAL LOW (ref >60)
GFR, EST AFRICAN AMERICAN: 31 mL/min — AB (ref >60)
Glucose, Bld: 82 mg/dL (ref 70–99)
Potassium: 3.8 mmol/L (ref 3.5–5.1)
Sodium: 138 mmol/L (ref 135–145)

## 2018-02-28 LAB — CBC
HCT: 26.4 % — ABNORMAL LOW (ref 36.0–46.0)
HEMOGLOBIN: 8.2 g/dL — AB (ref 12.0–15.0)
MCH: 33.6 pg (ref 26.0–34.0)
MCHC: 31.1 g/dL (ref 30.0–36.0)
MCV: 108.2 fL — AB (ref 78.0–100.0)
Platelets: 78 10*3/uL — ABNORMAL LOW (ref 150–400)
RBC: 2.44 MIL/uL — AB (ref 3.87–5.11)
RDW: 20.9 % — ABNORMAL HIGH (ref 11.5–15.5)
WBC: 4.5 10*3/uL (ref 4.0–10.5)

## 2018-02-28 LAB — PH, BODY FLUID: pH, Body Fluid: 7.7

## 2018-02-28 LAB — PROTIME-INR
INR: 1.19
Prothrombin Time: 15 seconds (ref 11.4–15.2)

## 2018-02-28 NOTE — Care Management Note (Addendum)
Case Management Note  Patient Details  Name: [name] MRN: [id number] Date of Birth: February 21, 1946  Subjective/Objective:    Admitted with advanced liver cirrhosis secondary to nonalcoholic steatohepatitis (NASH).  Resides with husband. Home oxygen provided per [facility].           [name] (Spouse)      [phone]      [id number] Melford Aase  Action/Plan: Transition to home today with home health services to follow. NCM f/u with Vinie Sill NP/ Palliative care Nurse regarding palliative consult.  Elmo Putt stated pt/husband has declined  palliative care to follow with d/c.  Husband to bring portable oxygen tank for transportation to home.  Expected Discharge Date:    02/28/2018        Expected Discharge Plan:  [place]  In-House Referral:  [facility]  Discharge planning Services  CM Consult  Post Acute Care Choice:    Choice offered to:  Patient, Spouse  DME Arranged:   N/A DME Agency:   N/A  HH Arranged:  PT, OT, RN [place] Agency:    [place]  Status of Service:  Completed, signed off  If discussed at [place] of Stay Meetings, dates discussed:    Additional Comments:  Sharin Mons, RN 02/28/2018, 4:01 PM

## 2018-02-28 NOTE — Consult Note (Signed)
Consultation Note Date: 02/28/2018   Patient Name: [name]  DOB: [birth year]  MRN: [id number]  Age / Sex: 72 y.o., female  PCP: Unk Pinto, MD Referring Physician: Florencia Reasons, MD  Reason for Consultation: Establishing goals of care  HPI/Patient Profile: 72 y.o. female  with past medical history of NASH cirrhosis, hepatocellular cancer stage II (s/p liver ablation), CKD stage III, COPD, HTN admitted on 02/25/2018 with swelling and SOB r/t decompensated cirrhosis with recurrent ascites and acute kidney injury. Renal function improving.   Clinical Assessment and Goals of Care: I met today with [name] along with her husband. We discussed her diagnoses of NASH, HCC, and kidney disease. [name] has been very upset and anxious as she believes her kidneys are failing and shutting down. We discussed that her renal function has improved today and she is having good urine output. We spent much time discussing the difficult balance of medications for her liver that can further harm her kidneys. Explained that her renal function is improving but this may continue to be a problem for her in the future. We discussed that the possibility of her renal function not improving at some point is a possibility and would be very bad with multiorgan failure. They seem to understand.   We also discussed the severity and progression of her disease trajectory. We discussed her wishes in the event she were to physically die with resuscitation. She does tell me that she would not want to "be brought back" as she understands this would be with worse QOL. She says "just let me go." She does not want to put herself or her family through a resuscitation. She is willing for interventions to prevent this from happening such as BiPAP and vasopressors.   All questions/concerns addressed. Emotional support provided.   Primary Decision  Maker PATIENT    SUMMARY OF RECOMMENDATIONS   - DNR placed on chart (BiPAP, vasopressors okay in hospital code order) - Hopeful for improvement and "as many Christmases as I can get" - Hopeful she can take another trip to the beach  Code Status/Advance Care Planning:  DNR   Symptom Management:   Per primary and GI. No symptoms currently.   Palliative Prophylaxis:   Bowel Regimen and Delirium Protocol  Additional Recommendations (Limitations, Scope, Preferences):  Full Scope Treatment  Psycho-social/Spiritual:   Desire for further Chaplaincy support:yes  Additional Recommendations: Caregiving  Support/Resources  Prognosis:   Overall poor prognosis with cirrhosis and poor functional status. Likely not hospice eligible at this point.   Discharge Planning: Home with Home Health      Primary Diagnoses: Present on Admission: . Liver cirrhosis secondary to nonalcoholic steatohepatitis (NASH) ([place]) . Symptomatic anemia . Essential hypertension . Esophageal varices ([place]) . CKD (chronic kidney disease), stage III ([place]) . Chronic diastolic CHF (congestive heart failure) ([place]) . Chronic respiratory failure ([place]) . Ascites   I have reviewed the medical record, interviewed the patient and family, and examined the patient. The following aspects are pertinent.  Past Medical  History:  Diagnosis Date  . Asthma   . Cancer [facility])    liver cancer   . COPD (chronic obstructive pulmonary disease) ([place])    x 3 years   . Depression   . Diabetes mellitus without complication ([place])    diet controlled  not on meds   . Edema    left leg   . Esophageal varices ([place])   . Fibromyalgia   . Gastritis   . GERD (gastroesophageal reflux disease)   . Hepatic cirrhosis ([place])   . Hepatic encephalopathy ([place])   . History of blood transfusion   . Hyperlipidemia   . Hypertension   . IBS (irritable bowel syndrome)   . Phlebitis    left leg x 2   . Pneumonia    hx of   .  Splenomegaly   . Vitamin D deficiency    Social History   Socioeconomic History  . Marital status: Married    Spouse name: [name]  . Number of children: 5  . Years of education: Not on file  . Highest education level: Not on file  Occupational History  . Occupation: Arboriculturist: UNEMPLOYED  Social Needs  . Financial resource strain: Not on file  . Food insecurity:    Worry: Not on file    Inability: Not on file  . Transportation needs:    Medical: Not on file    Non-medical: Not on file  Tobacco Use  . Smoking status: Former Smoker    Packs/day: 1.00    Years: 50.00    Pack years: 50.00    Types: Cigarettes    Last attempt to quit: 11/05/2014    Years since quitting: 3.3  . Smokeless tobacco: Never Used  . Tobacco comment: Quit April 2016  Substance and Sexual Activity  . Alcohol use: No    Alcohol/week: 0.0 oz  . Drug use: No  . Sexual activity: Not on file  Lifestyle  . Physical activity:    Days per week: Not on file    Minutes per session: Not on file  . Stress: Not on file  Relationships  . Social connections:    Talks on phone: Not on file    Gets together: Not on file    Attends religious service: Not on file    Active member of club or organization: Not on file    Attends meetings of clubs or organizations: Not on file    Relationship status: Not on file  Other Topics Concern  . Not on file  Social History Narrative  . Not on file   Family History  Problem Relation Age of Onset  . Diabetes Brother        deceased  . Heart disease Sister        A Fib  . Heart disease Mother        CHF  . Atrial fibrillation Sister   . Cancer Father        lung cancer   . Cancer Maternal Uncle        unknown type cancer   . Cancer Other        lung cancer   . Colon cancer Neg Hx    Scheduled Meds: . aspirin EC  81 mg Oral QHS  . cholecalciferol  5,000 Units Oral Q breakfast  . citalopram  40 mg Oral Daily  . ferrous sulfate  325 mg Oral BID WC    . gabapentin  600 mg  Oral TID  . lactulose  20 g Oral BID  . magnesium oxide  400 mg Oral QHS  . metolazone  5 mg Oral Daily  . midodrine  5 mg Oral TID WC  . sodium chloride flush  3 mL Intravenous Q12H  . vitamin C  500 mg Oral TID   Continuous Infusions: . sodium chloride Stopped (02/27/18 1259)   PRN Meds:.sodium chloride, albuterol, cyclobenzaprine, ipratropium, lidocaine (PF), ondansetron **OR** ondansetron (ZOFRAN) IV, ondansetron, sodium chloride flush, traMADol Allergies  Allergen Reactions  . Atorvastatin Other (See Comments)    Unknown reaction  . Diphenhydramine Hcl (Sleep) Hives  . Hydrocodone-Acetaminophen Other (See Comments)    Unknown reaction  . Lopid [Gemfibrozil] Other (See Comments)    Unknown reaction  . Loratadine Hives  . Lorazepam Hives  . Simvastatin Other (See Comments)    Unknown reaction  . Sulfamethoxazole Hives  . Sulfonamide Derivatives Hives   Review of Systems  Constitutional: Positive for activity change and fatigue. Negative for appetite change.  Gastrointestinal: Positive for diarrhea.  Neurological: Positive for weakness.    Physical Exam  Constitutional: She is oriented to person, place, and time. She appears well-developed.  HENT:  Head: Normocephalic and atraumatic.  Cardiovascular: Bradycardia present.  Pulmonary/Chest: Effort normal. No accessory muscle usage. No tachypnea. No respiratory distress.  Abdominal: She exhibits distension.  Neurological: She is alert and oriented to person, place, and time.  Nursing note and vitals reviewed.   Vital Signs: BP (!) 106/49 (BP Location: Left Arm)   Pulse (!) 59   Temp 98 F (36.7 C) (Oral)   Resp 18   Ht _0  (1.575 m)   Wt 84.8 kg (186 lb 15.2 oz)   SpO2 99%   BMI 34.19 kg/m  Pain Scale: 0-10   Pain Score: 7    SpO2: SpO2: 99 % O2 Device:SpO2: 99 % O2 Flow Rate: .O2 Flow Rate (L/min): 2 L/min  IO: Intake/output summary:   Intake/Output Summary (Last 24 hours) at  02/28/2018 1055 Last data filed at 02/28/2018 0930 Gross per 24 hour  Intake 353.52 ml  Output 551 ml  Net -197.48 ml    LBM: Last BM Date: 02/26/18 Baseline Weight: Weight: 88 kg (194 lb 0.1 oz) Most recent weight: Weight: 84.8 kg (186 lb 15.2 oz)     Palliative Assessment/Data: 40%     Time Total: 90 min  Greater than 50%  of this time was spent counseling and coordinating care related to the above assessment and plan.  Signed by: Vinie Sill, NP Palliative Medicine Team Pager # [phone] (M-F 8a-5p) Team Phone # [phone] (Nights/Weekends)

## 2018-02-28 NOTE — Progress Notes (Addendum)
Daily Rounding Note  02/28/2018, 10:09 AM  LOS: 3 days   SUBJECTIVE:   Chief complaint:     Still c/o right arm swelling and pain.  Appetite good.  1 stool recorded yesterday.    OBJECTIVE:         Vital signs in last 24 hours:    Temp:  [98 F (36.7 C)-98.5 F (36.9 C)] 98 F (36.7 C) (07/26 0553) Pulse Rate:  [55-59] 59 (07/26 0553) Resp:  [18-22] 18 (07/26 0553) BP: (106-129)/(49-87) 106/49 (07/26 0553) SpO2:  [98 %-100 %] 99 % (07/26 0553) Last BM Date: 02/26/18 Filed Weights   02/26/18 0145 02/26/18 0511  Weight: 194 lb 0.1 oz (88 kg) 186 lb 15.2 oz (84.8 kg)   General: looks unwell   Heart: RRR Chest: clear bil.   Abdomen: distended, moderately tense  Extremities: right UE edema Neuro/Psych:  Oriented x 3.  Alert.   Moves all 4s.  Less slowing of speech, thought processes.    Intake/Output from previous day: 07/25 0701 - 07/26 0700 In: 353.5 [P.O.:240; I.V.:13.5; IV Piggyback:100] Out: 1050 [Urine:550]  Intake/Output this shift: Total I/O In: -  Out: 1 [Stool:1]  Lab Results: Recent Labs    02/26/18 0845 02/27/18 0541 02/28/18 0453  WBC 3.8* 4.4 4.5  HGB 7.5* 7.6* 8.2*  HCT 23.3* 24.3* 26.4*  PLT 72* 74* 78*   BMET Recent Labs    02/26/18 0845 02/27/18 0541 02/28/18 0453  NA 133* 134* 138  K 3.2* 3.3* 3.8  CL 92* 93* 96*  CO2 30 30 32  GLUCOSE 82 89 82  BUN 65* 68* 63*  CREATININE 2.07* 2.26* 1.83*  CALCIUM 7.9* 8.0* 8.4*   LFT Recent Labs    02/25/18 1538 02/27/18 0541  PROT 4.8* 4.1*  ALBUMIN 2.5* 2.3*  AST 27 20  ALT 20 18  ALKPHOS 137* 119  BILITOT 1.4* 1.1  BILIDIR 0.4*  --   IBILI 1.0*  --    PT/INR Recent Labs    02/25/18 2113 02/28/18 0453  LABPROT 14.5 15.0  INR 1.14 1.19   Hepatitis Panel No results for input(s): HEPBSAG, HCVAB, HEPAIGM, HEPBIGM in the last 72 hours.  Studies/Results: Dg Cervical Spine 2 Or 3 Views  Result Date:  02/27/2018 CLINICAL DATA:  Right arm pain and swelling. History of multiple surgeries. EXAM: CERVICAL SPINE - 2-3 VIEW COMPARISON:  Cervical spine radiographs September 25, 2017 FINDINGS: Cervical vertebral bodies intact. Status post C4 through C6 ACDF with arthrodesis. C4 through C6 facet screws. Intact hardware without periprosthetic lucency. Nonsurgically altered disc heights preserved. Lateral masses in alignment. Osteopenia without destructive bony lesions. Prevertebral and paraspinal soft tissue planes are non suspicious. IMPRESSION: 1. Status post C4 through C6 fusion, with arthrodesis, no radiographic findings of hardware failure. 2. No fracture deformity or malalignment. Electronically Signed   By: Elon Alas M.D.   On: 02/27/2018 16:22   Dg Shoulder Right  Result Date: 02/27/2018 CLINICAL DATA:  RIGHT shoulder pain.  History of surgery. EXAM: RIGHT SHOULDER - 2+ VIEW COMPARISON:  RIGHT shoulder radiograph September 03, 2014 FINDINGS: Status post RIGHT shoulder arthroplasty. Stable appearance of hardware, stem approximates the cortex which is unchanged. No periprosthetic lucency. Suture anchor. Chronic humerus deformity, medially directed exostosis. Osteopenia. No fracture deformity or dislocation. No destructive bony lesions. Soft tissue planes are non suspicious. IMPRESSION: 1. No acute fracture deformity or dislocation. 2. Status post RIGHT shoulder arthroplasty with stable appearance of hardware.  Electronically Signed   By: Elon Alas M.D.   On: 02/27/2018 16:20   Ir Paracentesis  Result Date: 02/27/2018 INDICATION: Patient with history of cirrhosis, recurrent ascites. Request is made for diagnostic and therapeutic paracentesis. EXAM: ULTRASOUND GUIDED DIAGNOSTIC AND THERAPEUTIC PARACENTESIS MEDICATIONS: 10 mL 2% lidocaine COMPLICATIONS: None immediate. PROCEDURE: Informed written consent was obtained from the patient after a discussion of the risks, benefits and alternatives to  treatment. A timeout was performed prior to the initiation of the procedure. Initial ultrasound scanning demonstrates a small amount of ascites within the left lateral abdomen. The left lateral abdomen was prepped and draped in the usual sterile fashion. 2% lidocaine was used for local anesthesia. Following this, a 6 Fr Safe-T-Centesis catheter was introduced. An ultrasound image was saved for documentation purposes. The paracentesis was performed. The catheter was removed and a dressing was applied. The patient tolerated the procedure well without immediate post procedural complication. FINDINGS: A total of approximately 500 mL of clear, yellow fluid was removed. Samples were sent to the laboratory as requested by the clinical team. IMPRESSION: Successful ultrasound-guided diagnostic and therapeutic paracentesis yielding 500 mL of peritoneal fluid. Read by: Brynda Greathouse PA-C Electronically Signed   By: Markus Daft M.D.   On: 02/27/2018 09:58    Scheduled Meds: . aspirin EC  81 mg Oral QHS  . cholecalciferol  5,000 Units Oral Q breakfast  . citalopram  40 mg Oral Daily  . ferrous sulfate  325 mg Oral BID WC  . gabapentin  600 mg Oral TID  . lactulose  20 g Oral BID  . magnesium oxide  400 mg Oral QHS  . metolazone  5 mg Oral Daily  . midodrine  5 mg Oral TID WC  . sodium chloride flush  3 mL Intravenous Q12H  . vitamin C  500 mg Oral TID   Continuous Infusions: . sodium chloride Stopped (02/27/18 1259)   PRN Meds:.sodium chloride, albuterol, cyclobenzaprine, ipratropium, lidocaine (PF), ondansetron **OR** ondansetron (ZOFRAN) IV, ondansetron, sodium chloride flush, traMADol   ASSESMENT:    *Decompensated cirrhosis. Child's 9.  * Ascites. Despite increased Aldactone, stable Torsemide PTA.  Now only on Zaroxolyn due to AKI.  500 mL para today, cell count and diff without SBP.  No SBP or prior taps.   Per IR, patient has very shallow diffuse ascites which makes drainage  challenging, small  residual ascites of insignificant volume post tap.    * Hepatic encephalopathy. Increased asterixis, slower mentation in last 2 weeks. Generally compliant with lactulose but has not been receiving as inpt. Was unable to afford rifaximin. Lactulose resumed 7/25.    *  Pleural effusion and thoracentesis 09/2017. ? Hepatic hydrothorax?  * Anemia. Acute on chronic.  Hgb improved. On chronic po iron.  macrocytic: folate, B12 ok.      *   GIB 09/2017. EGD with grade 1 esophageal varices and moderate portal gastropathy. Transfused 1U PRBCs during admission late 01/2018.  FOBT + 7/24, no overt melena/ hematochezia.  *  Right shoulder, arm pain. Right arm swelling.  Suboptimal doppler study did not show DVT. Shoulder, neck x rays: s/p shoulder arthroplasty, s/p C 4-6 fusion, nothing acute.     * AKI. On background of CKD stage 3. BB, Bisoprolol stopped and midodrine added.     Creatinine improved. Remains on Zaroxolyn.     * Hx HCC, ablated 08/2017. No new lesions on 12/2017 MRI.    PLAN   *   Nothing new to  add.  Watch renal fx, if further improved consider starting low dose aldactone (? Stop zaroxolyn)   Azucena Freed  02/28/2018, 10:09 AM Phone [phone]     Attending physician's note   I have taken an interval history, reviewed the chart and examined the patient. I agree with the Advanced Practitioner's note, impression and recommendations. Decompensated cirrhosis with ascites. Cr has improved. Continue Midodrine tid. Remains on Zaroxolyn. Consider adding Aldactone 50 -100 mg daily. Long term 2g Na diet was stressed with patient and her husband. GI signing off. Outpatient GI follow with Dr. Havery Moros in 2 weeks.   Lucio Edward, MD FACG [phone] office

## 2018-02-28 NOTE — Progress Notes (Signed)
PROGRESS NOTE  [name] EVO:[id number] DOB: 10/13/1945 DOA: 02/25/2018 PCP: Unk Pinto, MD  HPI/Recap of past 24 hours:  Report feeling stronger  Shakiness has resolved She c/o right shoulder pain and neck pain is better today, reports pain is positional /intermittent and chronic  Assessment/Plan: Principal Problem:   Liver cirrhosis secondary to nonalcoholic steatohepatitis (NASH) ([place]) Active Problems:   Symptomatic anemia   Esophageal varices (HCC)   Essential hypertension   Chronic respiratory failure (HCC)   Ascites   Chronic diastolic CHF (congestive heart failure) (HCC)   CKD (chronic kidney disease), stage III (HCC)   Pressure injury of skin  Decompensated cirrhosis with Recurrent ascites: -H/o grade 1 esophageal varices and moderate portal gastropathy -IR for therapeutic para with 500 cc fluids removed,  fluids is cloudy, wbc 85. She received albumin infusion after thoracentesis today per gi recommendation. -She is started on midodrine  per gi recommendation, betablocker on hold -Gi consulted input appreciated   AKI on CKDIII Worsening on renal function on presentation Hepatorenal? Baseline cr 1.2-1.3 Cr 2.21 on presentation ua with trace bacteria, trace leuk, urine culture >100,000 enterococcus abium, case discussed with infectious disease Dr Linus Salmons who recommend this likely asymptomatic bacteriuria, do not recommend abx unless patient has fever or leukocytosis Renal dosing meds  Hepatic encephalopathy: Slower mentation but oriented x3, continue lactulose, not able to afford rifaximin She reports reason for admission due to she was "shaky so bad that she could not walk" She has significant asterixis on presentation, this has improved. She actually could get up from bed and used her walk to get to the chair this am  H/o  Stage 2 HCC, s/p IR ablation x 2 (lesions in left and right lobes) in 08/2017 MRI 12/2017 with ablation related defects, no new  lesions.    Acute on chronic anemia, macrocytic With h/o gi bleed in 09/2017. H/o grade 1 esophageal varices and moderate portal gastropathy She received prbc transfusion on admission this tim fobt Transfuse to keep hgb >7  Thrombocytopenia: likely from cirrhosis Monitor    Right upper extremity edema: Venous US negative for DVT but  limited due to shoulder pain Patient has right shoulder surgery when she was [age] weeks old, right arm hypoplasia  She reports chronic  intermittent right arm edema, chronic limited range of motion on right shoulder, but the pain is new, she also reports neck pain cervical spine x ray and right shoulder x ray no acute findings Consider lymphaedema wrap.  FTT: recurrent hospitalization, reports progressive weakness PT eval Palliative care consult  Stage I sacral decubitus ulcer, presented on admission  Depression,  decrease celexa from bid to once a day  Code Status: partial code  Family Communication: patient   Disposition Plan: home on 7/27 with  gi clearance,    Consultants:  [facility]  IR  Palliative care  Phone conversation with Infectious disease on 7/25  Procedures:  IR paracentesis on 7/25  Antibiotics:  none   Objective: BP (!) 138/98 (BP Location: Left Arm)   Pulse (!) 52   Temp 98 F (36.7 C) (Oral)   Resp 18   Ht 5' 2"  (1.575 m)   Wt 84.8 kg (186 lb 15.2 oz)   SpO2 (!) 80%   BMI 34.19 kg/m   Intake/Output Summary (Last 24 hours) at 02/28/2018 1519 Last data filed at 02/28/2018 0930 Gross per 24 hour  Intake 200 ml  Output 551 ml  Net -351 ml   [employer]  02/26/18 0145 02/26/18 0511  Weight: 88 kg (194 lb 0.1 oz) 84.8 kg (186 lb 15.2 oz)    Exam: Patient is examined daily including today on 02/28/2018, exams remain the same as of yesterday except that has changed    General:  Frail, pale, chronically ill, NAD  Cardiovascular: RRR  Respiratory: diminished at basis, no wheezing, no rales, no  rhonchi  Abdomen: distended, nontender, positive BS  Musculoskeletal: right upper extremity pitting Edema, right arm hypoplasia   Neuro: alert, oriented x4, does has impaired short term memory  Data Reviewed: Basic Metabolic Panel: Recent Labs  Lab 02/25/18 1538 02/26/18 0845 02/27/18 0541 02/28/18 0453  NA 133* 133* 134* 138  K 3.6 3.2* 3.3* 3.8  CL 91* 92* 93* 96*  CO2 31 30 30  32  GLUCOSE 137* 82 89 82  BUN 68* 65* 68* 63*  CREATININE 2.21* 2.07* 2.26* 1.83*  CALCIUM 8.5* 7.9* 8.0* 8.4*   Liver Function Tests: Recent Labs  Lab 02/25/18 1538 02/27/18 0541  AST 27 20  ALT 20 18  ALKPHOS 137* 119  BILITOT 1.4* 1.1  PROT 4.8* 4.1*  ALBUMIN 2.5* 2.3*   No results for input(s): LIPASE, AMYLASE in the last 168 hours. Recent Labs  Lab 02/25/18 2113 02/27/18 0625  AMMONIA 46* 72*   CBC: Recent Labs  Lab 02/25/18 1538 02/26/18 0845 02/27/18 0541 02/28/18 0453  WBC 5.9 3.8* 4.4 4.5  HGB 7.5* 7.5* 7.6* 8.2*  HCT 24.0* 23.3* 24.3* 26.4*  MCV 111.6* 105.4* 106.1* 108.2*  PLT 92* 72* 74* 78*   Cardiac Enzymes:   No results for input(s): CKTOTAL, CKMB, CKMBINDEX, TROPONINI in the last 168 hours. BNP (last 3 results) Recent Labs    09/08/17 1107 09/16/17 1253 02/25/18 1538  BNP 144.3* 157.0* 283.7*    ProBNP (last 3 results) No results for input(s): PROBNP in the last 8760 hours.  CBG: Recent Labs  Lab 02/25/18 1607  GLUCAP 119*    Recent Results (from the past 240 hour(s))  Urine culture     Status: Abnormal   Collection Time: 02/25/18  5:06 PM  Result Value Ref Range Status   Specimen Description URINE, RANDOM  Final   Special Requests   Final    NONE Performed at [facility], [place] [address], [place], [place] [address]    Culture >=100,000 COLONIES/mL ENTEROCOCCUS AVIUM (A)  Final   Report Status 02/28/2018 FINAL  Final   Organism ID, Bacteria ENTEROCOCCUS AVIUM (A)  Final      Susceptibility   Enterococcus avium - MIC*     AMPICILLIN <=2 SENSITIVE Sensitive     LEVOFLOXACIN 4 INTERMEDIATE Intermediate     NITROFURANTOIN 32 SENSITIVE Sensitive     VANCOMYCIN <=0.5 SENSITIVE Sensitive     * >=100,000 COLONIES/mL ENTEROCOCCUS AVIUM  Gram stain     Status: None   Collection Time: 02/27/18  9:31 AM  Result Value Ref Range Status   Specimen Description PERITONEAL CAVITY  Final   Special Requests NONE  Final   Gram Stain   Final    CYTOSPIN SMEAR WBC PRESENT, PREDOMINANTLY MONONUCLEAR NO ORGANISMS SEEN Performed at [facility], [place] [address], [place], [place] [address]    Report Status 02/27/2018 FINAL  Final  Culture, body fluid-bottle     Status: None (Preliminary result)   Collection Time: 02/27/18  9:31 AM  Result Value Ref Range Status   Specimen Description PERITONEAL CAVITY  Final   Special Requests NONE  Final   Culture   Final    NO GROWTH < 24 HOURS Performed at [facility], [place] [address], [place], [place] [address]    Report Status PENDING  Incomplete     Studies: Dg Cervical Spine 2 Or 3 Views  Result Date: 02/27/2018 CLINICAL DATA:  Right arm pain and swelling. History of multiple surgeries. EXAM: CERVICAL SPINE - 2-3 VIEW COMPARISON:  Cervical spine radiographs September 25, 2017 FINDINGS: Cervical vertebral bodies intact. Status post C4 through C6 ACDF with arthrodesis. C4 through C6 facet screws. Intact hardware without periprosthetic lucency. Nonsurgically altered disc heights preserved. Lateral masses in alignment. Osteopenia without destructive bony lesions. Prevertebral and paraspinal soft tissue planes are non suspicious. IMPRESSION: 1. Status post C4 through C6 fusion, with arthrodesis, no radiographic findings of hardware failure. 2. No fracture deformity or malalignment. Electronically Signed   By: Elon Alas M.D.   On: 02/27/2018 16:22   Dg Shoulder Right  Result Date: 02/27/2018 CLINICAL DATA:  RIGHT shoulder pain.  History of surgery. EXAM: RIGHT SHOULDER -  2+ VIEW COMPARISON:  RIGHT shoulder radiograph September 03, 2014 FINDINGS: Status post RIGHT shoulder arthroplasty. Stable appearance of hardware, stem approximates the cortex which is unchanged. No periprosthetic lucency. Suture anchor. Chronic humerus deformity, medially directed exostosis. Osteopenia. No fracture deformity or dislocation. No destructive bony lesions. Soft tissue planes are non suspicious. IMPRESSION: 1. No acute fracture deformity or dislocation. 2. Status post RIGHT shoulder arthroplasty with stable appearance of hardware. Electronically Signed   By: Elon Alas M.D.   On: 02/27/2018 16:20    Scheduled Meds: . aspirin EC  81 mg Oral QHS  . cholecalciferol  5,000 Units Oral Q breakfast  . citalopram  40 mg Oral Daily  . ferrous sulfate  325 mg Oral BID WC  . gabapentin  600 mg Oral TID  . lactulose  20 g Oral BID  . magnesium oxide  400 mg Oral QHS  . metolazone  5 mg Oral Daily  . midodrine  5 mg Oral TID WC  . sodium chloride flush  3 mL Intravenous Q12H  . vitamin C  500 mg Oral TID    Continuous Infusions: . sodium chloride Stopped (02/27/18 1259)     Time spent: 58mns I have personally reviewed and interpreted on  02/28/2018 daily labs,  imagings as discussed above under date review session and assessment and plans.  I reviewed all nursing notes, pharmacy notes, consultant notes,  vitals, pertinent old records  I have discussed plan of care as described above with RN , patient  on 02/28/2018   FFlorencia ReasonsMD, PhD  [facility] Pager [phone] If 7PM-7AM, please contact night-coverage at [email or web address], password [facility]7/26/2019, 3:19 PM  LOS: 3 days

## 2018-02-28 NOTE — Consult Note (Signed)
   [facility] Inpatient Consult   02/28/2018  LEENA TIEDE 14-Apr-1946 [id number]  Patient was assessed for potential restart of [place] [facility] for community services with [employer] plan. Patient has a distant HX with [facility] Care Management.  Chart review reveals that patient and family has had a Palliative Care consult today.   Admitted with increase abdominal girth with HX of NASH in which her past medical HX includes but not limited to COPD, CKD and HF.  Patient 's primary care provide is listed as Dr. Unk Pinto with [facility]. Went by the patient room and patient was asleep and no family in the room.  Did not awaken.  Will follow up with inpatient RNCM. Awaiting full palliative note.  Of note, [facility] Care Management services does not replace or interfere with any services that are arranged by inpatient case management or social work. For additional questions or referrals please contact:  Natividad Brood, RN BSN [facility] Liaison  [phone] business mobile phone Toll free office [phone]

## 2018-02-28 NOTE — Evaluation (Signed)
Occupational Therapy Evaluation Patient Details Name: [name] MRN: [id number] DOB: [birth year] Today's Date: 02/28/2018    History of Present Illness Patient is a 72 y/o female presenting to the ED on 02/25/18 with swelling/increased abdominal girth and SOB. Admitted with advanced liver cirrhosis secondary to nonalcoholic steatohepatitis (NASH) with planned paracentesis. PMH significant for chronic kidney disease, COPD, advanced cirrhosis of the liver due to [facility], hypertension.   Clinical Impression   Pt participates, but is heavily reliant on her husband for ADL and IADL. She uses a RW or w/c for mobility. Pt presents with nausea, generalized weakness and decreased standing balance. She complains of neck pain. Pt requires set up to max assist for ADL. Pt likely close to her baseline. Will follow acutely.    Follow Up Recommendations  No OT follow up    Equipment Recommendations  None recommended by OT    Recommendations for Other Services       Precautions / Restrictions Precautions Precautions: Fall Precaution Comments: O2 dependent--2L at baseline Restrictions Weight Bearing Restrictions: No      Mobility Bed Mobility Overal bed mobility: Needs Assistance Bed Mobility: Supine to Sit;Sit to Supine     Supine to sit: Min assist;HOB elevated Sit to supine: Mod assist   General bed mobility comments: assist to raise trunk and for LEs back into bed  Transfers Overall transfer level: Needs assistance Equipment used: Rolling walker (2 wheeled) Transfers: Sit to/from [facility] Sit to Stand: Min assist Stand pivot transfers: Min assist       General transfer comment: min assist to rise and steady as she took pivotal steps to transfer, cues for hand placement with RW    Balance Overall balance assessment: Mild deficits observed, not formally tested                                         ADL either performed or assessed with  clinical judgement   ADL Overall ADL's : Needs assistance/impaired Eating/Feeding: Set up;Bed level Eating/Feeding Details (indicate cue type and reason): pt with poor appetite/nausea Grooming: Wash/dry hands;Wash/dry face;Bed level;Set up   Upper Body Bathing: Minimal assistance;Sitting   Lower Body Bathing: Maximal assistance;Sit to/from stand   Upper Body Dressing : Minimal assistance;Sitting   Lower Body Dressing: Maximal assistance;Sit to/from stand   Toilet Transfer: Minimal assistance;Stand-pivot;RW;BSC   Toileting- Clothing Manipulation and Hygiene: Moderate assistance;Sit to/from stand       Functional mobility during ADLs: Minimal assistance;Rolling walker       Vision Patient Visual Report: No change from baseline       Perception     Praxis      Pertinent Vitals/Pain Pain Assessment: Faces Faces Pain Scale: Hurts little more Pain Location: neck Pain Descriptors / Indicators: Sore Pain Intervention(s): Heat applied     Hand Dominance Right   Extremity/Trunk Assessment Upper Extremity Assessment Upper Extremity Assessment: Generalized weakness   Lower Extremity Assessment Lower Extremity Assessment: Defer to PT evaluation   Cervical / Trunk Assessment Cervical / Trunk Assessment: Kyphotic   Communication Communication Communication: No difficulties   Cognition Arousal/Alertness: Awake/alert Behavior During Therapy: WFL for tasks assessed/performed Overall Cognitive Status: Within Functional Limits for tasks assessed  General Comments      Exercises     Shoulder Instructions      Home Living Family/patient expects to be discharged to:: Private residence Living Arrangements: Spouse/significant other Available Help at Discharge: Family;Available 24 hours/day Type of Home: House Home Access: Stairs to enter [employer] of Steps: 2 Entrance Stairs-Rails: None Home Layout: One  level     Bathroom Shower/Tub: Walk-in shower;Door   Bathroom Toilet: Standard(with 3 in 1 over)     Home Equipment: Environmental consultant - 2 wheels;Bedside commode;Shower seat;Transport chair;Other (comment)(bed is electric)          Prior Functioning/Environment Level of Independence: Needs assistance  Gait / Transfers Assistance Needed: reports she uses RW at home, but will sometimes use WC as well  ADL's / Homemaking Assistance Needed: husband assists with ADLs and intermittent with mobility            OT Problem List: Decreased strength;Decreased activity tolerance;Impaired balance (sitting and/or standing);Decreased knowledge of use of DME or AE;Obesity;Pain      OT Treatment/Interventions: Self-care/ADL training;Patient/family education;Balance training;Therapeutic activities;DME and/or AE instruction    OT Goals(Current goals can be found in the care plan section) Acute Rehab OT Goals Patient Stated Goal: "I want to go home tomorrow" OT Goal Formulation: With patient Time For Goal Achievement: 03/14/18 Potential to Achieve Goals: Good ADL Goals Pt Will Perform Upper Body Bathing: with min assist;sitting Pt Will Perform Upper Body Dressing: with set-up;with supervision;sitting Pt Will Transfer to Toilet: with min guard assist;ambulating;bedside commode(over toilet) Pt Will Perform Toileting - Clothing Manipulation and hygiene: with min guard assist;sit to/from stand  OT Frequency: Min 2X/week   Barriers to D/C:            Co-evaluation              AM-PAC PT "6 Clicks" Daily Activity     Outcome Measure Help from another person eating meals?: None Help from another person taking care of personal grooming?: A Little Help from another person toileting, which includes using toliet, bedpan, or urinal?: A Lot Help from another person bathing (including washing, rinsing, drying)?: A Lot Help from another person to put on and taking off regular upper body clothing?: A  Little Help from another person to put on and taking off regular lower body clothing?: A Lot 6 Click Score: 16   End of Session Equipment Utilized During Treatment: Gait belt;Rolling walker;Oxygen(2L)  Activity Tolerance: Treatment limited secondary to medical complications (Comment)(nausea) Patient left: in bed;with call bell/phone within reach;with bed alarm set;Other (comment)(MD in room)  OT Visit Diagnosis: Other abnormalities of gait and mobility (R26.89);Unsteadiness on feet (R26.81);Pain;Muscle weakness (generalized) (M62.81)                Time: 5400-8676 OT Time Calculation (min): 16 min Charges:  OT General Charges $OT Visit: 1 Visit OT Evaluation $OT Eval Moderate Complexity: 1 Mod  02/28/2018 Nestor Lewandowsky, OTR/L Pager: [phone]  Werner Lean, Haze Boyden 02/28/2018, 3:33 PM

## 2018-02-28 NOTE — Care Management Important Message (Signed)
Important Message  Patient Details  Name: [name] MRN: [id number] Date of Birth: [birth year]   Medicare Important Message Given:  Yes    Orbie Pyo 02/28/2018, 3:29 PM

## 2018-02-28 NOTE — Progress Notes (Signed)
Physical Therapy Treatment Patient Details Name: [name] MRN: [id number] DOB: 1946/04/22 Today's Date: 02/28/2018    History of Present Illness Patient is a 72 y/o female presenting to the ED on 02/25/18 with swelling/increased abdominal girth and SOB. Admitted with advanced liver cirrhosis secondary to nonalcoholic steatohepatitis (NASH) with planned paracentesis. PMH significant for chronic kidney disease, COPD, advanced cirrhosis of the liver due to [facility], hypertension.    PT Comments    Patient requires min guard/min A overall for mobility. Husband present and assisting pt as needed. Pt is able to ambulate 83f with RW with 3L O2 via [place]. Current plan remains appropriate.    Follow Up Recommendations  Home health PT;Supervision/Assistance - 24 hour     Equipment Recommendations  None recommended by PT(has equipment at home)    Recommendations for Other Services       Precautions / Restrictions Precautions Precautions: Fall Precaution Comments: O2 dependent Restrictions Weight Bearing Restrictions: No    Mobility  Bed Mobility Overal bed mobility: Needs Assistance Bed Mobility: Supine to Sit     Supine to sit: Min assist;HOB elevated     General bed mobility comments: pt's husband assisted pt to EOB with assistance to elevate trunk into sitting   Transfers Overall transfer level: Needs assistance Equipment used: Rolling walker (2 wheeled) Transfers: Sit to/from [facility] to Stand: Min assist         General transfer comment: min A to power up into standing; cues for safe hand placement   Ambulation/Gait Ambulation/Gait assistance: Min guard Gait Distance (Feet): 35 Feet Assistive device: Rolling walker (2 wheeled) Gait Pattern/deviations: Step-through pattern;Shuffle;Decreased stride length;Decreased dorsiflexion - right;Decreased dorsiflexion - left;Trunk flexed Gait velocity: decreased   General Gait Details: cues for posture  and increased bilat step lengths and breathing technique   Stairs             Wheelchair Mobility    Modified Rankin (Stroke Patients Only)       Balance Overall balance assessment: Mild deficits observed, not formally tested                                          Cognition Arousal/Alertness: Awake/alert Behavior During Therapy: WFL for tasks assessed/performed Overall Cognitive Status: Within Functional Limits for tasks assessed                                        Exercises      General Comments General comments (skin integrity, edema, etc.): pt's husband present throughout session; pt with c/o tingling "all over" and feeling like she will pass out when sitting on commode; SpO2 and HR WNL but unable to obtain BP at the time; with rest and breathing technique pt began to feel better and reported that tingling had subsided; husband reports these "episodes" are baseline for pt       Pertinent Vitals/Pain Pain Assessment: Faces Faces Pain Scale: Hurts little more Pain Location: bilat LE Pain Descriptors / Indicators: Grimacing;Aching Pain Intervention(s): Limited activity within patient's tolerance;Monitored during session;Repositioned    Home Living                      Prior Function            PT Goals (  current goals can now be found in the care plan section) Acute Rehab PT Goals PT Goal Formulation: With patient Time For Goal Achievement: 03/13/18 Potential to Achieve Goals: Good Progress towards PT goals: Progressing toward goals    Frequency    Min 3X/week      PT Plan Current plan remains appropriate    Co-evaluation              AM-PAC PT "6 Clicks" Daily Activity  Outcome Measure  Difficulty turning over in bed (including adjusting bedclothes, sheets and blankets)?: A Lot Difficulty moving from lying on back to sitting on the side of the bed? : Unable Difficulty sitting down on and  standing up from a chair with arms (e.g., wheelchair, bedside commode, etc,.)?: Unable Help needed moving to and from a bed to chair (including a wheelchair)?: A Little Help needed walking in hospital room?: A Little Help needed climbing 3-5 steps with a railing? : A Lot 6 Click Score: 12    End of Session Equipment Utilized During Treatment: Gait belt;Oxygen(3L O2 via  while ambulating ) Activity Tolerance: Patient limited by fatigue Patient left: in chair;with call bell/phone within reach;with family/visitor present Nurse Communication: Mobility status PT Visit Diagnosis: Unsteadiness on feet (R26.81);Other abnormalities of gait and mobility (R26.89);Muscle weakness (generalized) (M62.81)     Time: 0037-9444 PT Time Calculation (min) (ACUTE ONLY): 28 min  Charges:  $Gait Training: 8-22 mins $Therapeutic Activity: 8-22 mins                     Earney Navy, PTA Pager: [phone]     Darliss Cheney 02/28/2018, 1:34 PM

## 2018-03-01 LAB — BASIC METABOLIC PANEL
Anion gap: 11 (ref 5–15)
BUN: 61 mg/dL — ABNORMAL HIGH (ref 8–23)
CO2: 31 mmol/L (ref 22–32)
Calcium: 8.6 mg/dL — ABNORMAL LOW (ref 8.9–10.3)
Chloride: 96 mmol/L — ABNORMAL LOW (ref 98–111)
Creatinine, Ser: 1.73 mg/dL — ABNORMAL HIGH (ref 0.44–1.00)
GFR calc Af Amer: 33 mL/min — ABNORMAL LOW (ref >60)
GFR, EST NON AFRICAN AMERICAN: 29 mL/min — AB (ref >60)
Glucose, Bld: 86 mg/dL (ref 70–99)
Potassium: 3.9 mmol/L (ref 3.5–5.1)
Sodium: 138 mmol/L (ref 135–145)

## 2018-03-01 LAB — CBC
HCT: 28.2 % — ABNORMAL LOW (ref 36.0–46.0)
Hemoglobin: 8.6 g/dL — ABNORMAL LOW (ref 12.0–15.0)
MCH: 33.3 pg (ref 26.0–34.0)
MCHC: 30.5 g/dL (ref 30.0–36.0)
MCV: 109.3 fL — ABNORMAL HIGH (ref 78.0–100.0)
Platelets: 89 10*3/uL — ABNORMAL LOW (ref 150–400)
RBC: 2.58 MIL/uL — ABNORMAL LOW (ref 3.87–5.11)
RDW: 20.6 % — AB (ref 11.5–15.5)
WBC: 6.4 10*3/uL (ref 4.0–10.5)

## 2018-03-01 MED ORDER — CITALOPRAM HYDROBROMIDE 40 MG PO TABS: 40.0000 mg | ORAL_TABLET | Freq: Every day | ORAL | 0 refills | Status: DC

## 2018-03-01 MED ORDER — FERROUS SULFATE 325 (65 FE) MG PO TABS: 325.0000 mg | ORAL_TABLET | Freq: Every day | ORAL | 0 refills | Status: AC

## 2018-03-01 MED ORDER — MIDODRINE HCL 5 MG PO TABS: 5.0000 mg | ORAL_TABLET | Freq: Three times a day (TID) | ORAL | 0 refills | Status: DC

## 2018-03-01 MED ORDER — POTASSIUM CHLORIDE ER 10 MEQ PO TBCR: 10.0000 meq | EXTENDED_RELEASE_TABLET | Freq: Every day | ORAL | 0 refills | Status: DC

## 2018-03-01 MED ORDER — SPIRONOLACTONE 25 MG PO TABS: 25.0000 mg | ORAL_TABLET | Freq: Two times a day (BID) | ORAL | 0 refills | Status: DC

## 2018-03-01 NOTE — Discharge Summary (Signed)
Discharge Summary  [name]:[id number] DOB: 18-Feb-1946  PCP: Unk Pinto, MD  Admit date: 02/25/2018 Discharge date: 03/01/2018  Time spent: 39mns, more than 50% time spent on coordination of care.  Recommendations for Outpatient Follow-up:  1. F/u with PMD within a week  for hospital discharge follow up, repeat cbc/bmp at follow up. 2. F/u with GI Dr AIdelle Crouchin two weeks  Discharge Diagnoses:  Active Hospital Problems   Diagnosis Date Noted  . Liver cirrhosis secondary to nonalcoholic steatohepatitis (NASH) ([facility]   . Pressure injury of skin 02/27/2018  . CKD (chronic kidney disease), stage III ([facility]   . Chronic diastolic CHF (congestive heart failure) ([facility]   . Ascites 09/07/2017  . Chronic respiratory failure ([facility] 01/19/2016  . Essential hypertension   . Esophageal varices ([facility] 07/08/2012  . Symptomatic anemia 05/28/2008    Resolved Hospital Problems  No resolved problems to display.    Discharge Condition: stable  Diet recommendation: 2g sodium diet  Filed Weights   02/26/18 0145 02/26/18 0511  Weight: 88 kg (194 lb 0.1 oz) 84.8 kg (186 lb 15.2 oz)    History of present illness: (per Admitting MD Dr DShanon Brow PCP: MUnk Pinto MD  Patient coming from: Home  Chief Complaint: Swelling and shortness of breath  HPI: Kerry TOUTANTis a 72y.o. female with medical history significant of chronic kidney disease, COPD, advanced cirrhosis of the liver due to NKarlene Lineman hypertension comes in with her husband due to swelling increased abdominal girth and worsening shortness of breath.  She denies any fevers.  She denies any nausea vomiting or diarrhea.  She is been taking care of mostly by her husband at home who is along with her.  Her stools are always black and they deny any worsening of her stool color or any bright red blood per rectum.  She was recently hospitalized and had increase in her diuretics and this is not helping.  She is gained over 10  pounds in the last week.  They have been offered hospice care at home but her husband thinks that he does not need this at this time and reclined.  Patient is being referred for admission for the need for a repeat centesis to help relieve her shortness of breath.    Hospital Course:  Principal Problem:   Liver cirrhosis secondary to nonalcoholic steatohepatitis (NASH) (HCC) Active Problems:   Symptomatic anemia   Esophageal varices (HCC)   Essential hypertension   Chronic respiratory failure (HCC)   Ascites   Chronic diastolic CHF (congestive heart failure) (HCC)   CKD (chronic kidney disease), stage III (HCC)   Pressure injury of skin   Decompensated cirrhosis with Recurrent ascites: -H/o grade 1 esophageal varices and moderate portal gastropathy -IR for therapeutic para with 500 cc fluids removed,  fluids is cloudy, wbc 85. She received albumin infusion after thoracentesis today per gi recommendation. -She is started on midodrine  per gi recommendation, betablocker on hold -home meds zaroxolyn continued during hospitalization, home meds spironolactone and demadex held due to worsening of renal function, cr improved at discharge, spironolactone restarted at a lower dose. She is to follow up with Gi in two weeks to further adjust diuretics. -Gi consulted input appreciated   AKI on CKDIII Worsening on renal function on presentation Hepatorenal? Baseline cr 1.2-1.3 Cr 2.21 on presentation ua with trace bacteria, trace leuk, urine culture >100,000 enterococcus abium, case discussed with infectious disease Dr CLinus Salmonswho recommend this likely asymptomatic bacteriuria, do not  recommend abx unless patient has fever or leukocytosis Renal dosing meds Cr improved, cr 1.73 at discharge  Hepatic encephalopathy: Slower mentation but oriented x3, continue lactulose, not able to afford rifaximin She reports reason for admission due to she was "shaky so bad that she could not walk" She has  significant asterixis on presentation, asterixia has resolved at discharge. she is able to participate PT. Home health arranged.  H/o  Stage 2 HCC, s/p IR ablation x 2 (lesions in left and right lobes) in 08/2017 MRI 12/2017 with ablation related defects, no new lesions.    Acute on chronic anemia, macrocytic With h/o gi bleed in 09/2017. H/o grade 1 esophageal varices and moderate portal gastropathy She received prbc transfusionx1 on admission this hospitalization hgb above 8 x2 days at discharge Continue iron supplement F/u with gi  Thrombocytopenia: likely from cirrhosis Stable , close to baseline 70-80's.   Right upper extremity edema: Venous US negative for DVT but  limited due to shoulder pain Patient has right shoulder surgery when she was [age] weeks old, right arm hypoplasia  She reports chronic  intermittent right arm edema, chronic limited range of motion on right shoulder, but the pain is new, she also reports neck pain cervical spine x ray and right shoulder x ray no acute findings Follow up with pcp, Consider lymphaedema wrap  FTT: recurrent hospitalization, reports progressive weakness PT eval Palliative care consulted, limited code status confirmed.  Home with home health  Stage I sacral decubitus ulcer, presented on admission  Depression,  decrease celexa from bid to once a day  Code Status: partial code (yes to bipap and vasopressor, no CPR, no defibrillation, no intubation)  Family Communication: patient   Disposition Plan: home on 7/27 with  gi clearance,    Consultants:  [facility]  IR  Palliative care  Phone conversation with Infectious disease on 7/25  Procedures:  IR paracentesis on 7/25  Antibiotics:  none     Discharge Exam: BP (!) 106/45 (BP Location: Left Wrist)   Pulse 69   Temp 98.2 F (36.8 C) (Oral)   Resp 20   Ht 5' 2"  (1.575 m)   Wt 84.8 kg (186 lb 15.2 oz)   SpO2 99%   BMI 34.19 kg/m     General:   Frail, pale, chronically ill, NAD  Cardiovascular: RRR  Respiratory: diminished at basis, no wheezing, no rales, no rhonchi  Abdomen: distended, nontender, positive BS  Musculoskeletal: right upper extremity pitting Edema, right arm hypoplasia   Neuro: alert, oriented x4, does has impaired short term memory   Discharge Instructions You were cared for by a hospitalist during your hospital stay. If you have any questions about your discharge medications or the care you received while you were in the hospital after you are discharged, you can call the unit and asked to speak with the hospitalist on call if the hospitalist that took care of you is not available. Once you are discharged, your primary care physician will handle any further medical issues. Please note that NO REFILLS for any discharge medications will be authorized once you are discharged, as it is imperative that you return to your primary care physician (or establish a relationship with a primary care physician if you do not have one) for your aftercare needs so that they can reassess your need for medications and monitor your lab values.  Discharge Instructions    Diet - low sodium heart healthy   Complete by:  As directed  2g sodium diet   Discharge instructions   Complete by:  As directed    Please monitor # of bm daily, goal to have three BM daily (per 24hrs)   Increase activity slowly   Complete by:  As directed    Increase activity slowly   Complete by:  As directed      Allergies as of 03/01/2018      Reactions   Atorvastatin Other (See Comments)   Unknown reaction   Diphenhydramine Hcl (sleep) Hives   Hydrocodone-acetaminophen Other (See Comments)   Unknown reaction   Lopid [gemfibrozil] Other (See Comments)   Unknown reaction   Loratadine Hives   Lorazepam Hives   Simvastatin Other (See Comments)   Unknown reaction   Sulfamethoxazole Hives   Sulfonamide Derivatives Hives      Medication List      STOP taking these medications   bisoprolol 10 MG tablet Commonly known as:  ZEBETA   rifampin 300 MG capsule Commonly known as:  RIFADIN   torsemide 20 MG tablet Commonly known as:  DEMADEX     TAKE these medications   albuterol 108 (90 Base) MCG/ACT inhaler Commonly known as:  PROVENTIL HFA;VENTOLIN HFA Inhale 2 puffs into the lungs every 6 (six) hours as needed for wheezing or shortness of breath.   allopurinol 300 MG tablet Commonly known as:  ZYLOPRIM take 1 tablet (300 mg) by mouth daily with supper - for gout   ALPRAZolam 0.25 MG tablet Commonly known as:  XANAX Take 1 tab as needed for anxiety, cramping, air hunger every 8 hours as needed.   aspirin EC 81 MG tablet Take 81 mg by mouth at bedtime.   citalopram 40 MG tablet Commonly known as:  CELEXA Take 1 tablet (40 mg total) by mouth daily.   cyclobenzaprine 10 MG tablet Commonly known as:  FLEXERIL Take 1 tablet (10 mg total) by mouth 3 (three) times daily as needed for muscle spasms.   ferrous sulfate 325 (65 FE) MG tablet Take 1 tablet (325 mg total) by mouth daily with breakfast. What changed:  when to take this   gabapentin 600 MG tablet Commonly known as:  NEURONTIN Take 600 mg by mouth 3 (three) times daily.   Glycopyrrolate-Formoterol 9-4.8 MCG/ACT Aero Commonly known as:  BEVESPI AEROSPHERE Inhale 2 puffs into the lungs 2 (two) times daily.   ipratropium 0.02 % nebulizer solution Commonly known as:  ATROVENT Take 3 mLs by nebulization every 4 (four) hours as needed for wheezing or shortness of breath.   lactulose 10 GM/15ML solution Commonly known as:  CHRONULAC take 37ms TWICE DAILY What changed:    how much to take  how to take this  when to take this  additional instructions   Magnesium 250 MG Tabs Take 250 mg by mouth at bedtime.   metolazone 5 MG tablet Commonly known as:  ZAROXOLYN Take 1 tablet daily or as directed for fluid Overload   midodrine 5 MG tablet Commonly  known as:  PROAMATINE Take 1 tablet (5 mg total) by mouth 3 (three) times daily with meals.   ondansetron 4 MG tablet Commonly known as:  ZOFRAN Take 1 tablet (4 mg total) by mouth every 6 (six) hours as needed for nausea or vomiting.   OXYGEN Inhale 2-3 L into the lungs continuous. 3 L with exertion   potassium chloride 10 MEQ tablet Commonly known as:  K-DUR Take 1 tablet (10 mEq total) by mouth daily. TAKE 2 TABLETS BY MOUTH  3 TIMES A DAY. What changed:    how much to take  how to take this  when to take this   spironolactone 25 MG tablet Commonly known as:  ALDACTONE Take 1 tablet (25 mg total) by mouth 2 (two) times daily. What changed:  See the new instructions.   traMADol 50 MG tablet Commonly known as:  ULTRAM Take 50 mg by mouth every 12 hours as needed for pain for chronic pain   vitamin C 500 MG tablet Commonly known as:  ASCORBIC ACID Take 500 mg by mouth 3 (three) times daily.   Vitamin D3 5000 units Caps Take 5,000 Units by mouth daily with breakfast.      Allergies  Allergen Reactions  . Atorvastatin Other (See Comments)    Unknown reaction  . Diphenhydramine Hcl (Sleep) Hives  . Hydrocodone-Acetaminophen Other (See Comments)    Unknown reaction  . Lopid [Gemfibrozil] Other (See Comments)    Unknown reaction  . Loratadine Hives  . Lorazepam Hives  . Simvastatin Other (See Comments)    Unknown reaction  . Sulfamethoxazole Hives  . Sulfonamide Derivatives Hives   Follow-up Information    Armbruster, Carlota Raspberry, MD Follow up in 2 week(s).   Specialty:  Gastroenterology Why:  cirrhosis/ascites/hepatic encephalopathy Contact information: [place] [address] [phone]        Unk Pinto, MD Follow up in 1 week(s).   Specialty:  Internal Medicine Why:  hospital discharge follow up. repeat cbc/bmp at follow up pcp to refer to lymphedema clinic for right arm edema Contact information: [id number] [place] [address] [phone]        [facility] Follow up.   Why:  home health services arranged Contact information: [place] [address] [phone]            The results of significant diagnostics from this hospitalization (including imaging, microbiology, ancillary and laboratory) are listed below for reference.    Significant Diagnostic Studies: Dg Chest 2 View  Result Date: 02/25/2018 CLINICAL DATA:  Short of breath on exertion EXAM: CHEST - 2 VIEW COMPARISON:  12/25/2017, 09/27/2017 FINDINGS: Postsurgical changes in the cervical spine. Tiny pleural effusions. No focal consolidation. Borderline to mild cardiomegaly. Aortic atherosclerosis. No pneumothorax. Treated compression deformity of the lower spine. Right shoulder replacement. IMPRESSION: Tiny pleural effusions.  Stable borderline to mild cardiomegaly. Electronically Signed   By: Donavan Foil M.D.   On: 02/25/2018 21:10   Dg Cervical Spine 2 Or 3 Views  Result Date: 02/27/2018 CLINICAL DATA:  Right arm pain and swelling. History of multiple surgeries. EXAM: CERVICAL SPINE - 2-3 VIEW COMPARISON:  Cervical spine radiographs September 25, 2017 FINDINGS: Cervical vertebral bodies intact. Status post C4 through C6 ACDF with arthrodesis. C4 through C6 facet screws. Intact hardware without periprosthetic lucency. Nonsurgically altered disc heights preserved. Lateral masses in alignment. Osteopenia without destructive bony lesions. Prevertebral and paraspinal soft tissue planes are non suspicious. IMPRESSION: 1. Status post C4 through C6 fusion, with arthrodesis, no radiographic findings of hardware failure. 2. No fracture deformity or malalignment. Electronically Signed   By: Elon Alas M.D.   On: 02/27/2018 16:22   Dg Shoulder Right  Result Date: 02/27/2018 CLINICAL DATA:  RIGHT shoulder pain.  History of surgery. EXAM: RIGHT SHOULDER - 2+ VIEW COMPARISON:  RIGHT  shoulder radiograph September 03, 2014 FINDINGS: Status post RIGHT shoulder arthroplasty. Stable appearance of hardware, stem approximates the cortex which is  unchanged. No periprosthetic lucency. Suture anchor. Chronic humerus deformity, medially directed exostosis. Osteopenia. No fracture deformity or dislocation. No destructive bony lesions. Soft tissue planes are non suspicious. IMPRESSION: 1. No acute fracture deformity or dislocation. 2. Status post RIGHT shoulder arthroplasty with stable appearance of hardware. Electronically Signed   By: Elon Alas M.D.   On: 02/27/2018 16:20   Ir Paracentesis  Result Date: 02/27/2018 INDICATION: Patient with history of cirrhosis, recurrent ascites. Request is made for diagnostic and therapeutic paracentesis. EXAM: ULTRASOUND GUIDED DIAGNOSTIC AND THERAPEUTIC PARACENTESIS MEDICATIONS: 10 mL 2% lidocaine COMPLICATIONS: None immediate. PROCEDURE: Informed written consent was obtained from the patient after a discussion of the risks, benefits and alternatives to treatment. A timeout was performed prior to the initiation of the procedure. Initial ultrasound scanning demonstrates a small amount of ascites within the left lateral abdomen. The left lateral abdomen was prepped and draped in the usual sterile fashion. 2% lidocaine was used for local anesthesia. Following this, a 6 Fr Safe-T-Centesis catheter was introduced. An ultrasound image was saved for documentation purposes. The paracentesis was performed. The catheter was removed and a dressing was applied. The patient tolerated the procedure well without immediate post procedural complication. FINDINGS: A total of approximately 500 mL of clear, yellow fluid was removed. Samples were sent to the laboratory as requested by the clinical team. IMPRESSION: Successful ultrasound-guided diagnostic and therapeutic paracentesis yielding 500 mL of peritoneal fluid. Read by: Brynda Greathouse PA-C Electronically Signed   By: Markus Daft M.D.   On: 02/27/2018 09:58    Microbiology: Recent Results (from the past 240 hour(s))  Urine culture     Status: Abnormal   Collection Time: 02/25/18  5:06 PM  Result Value Ref Range Status   Specimen Description URINE, RANDOM  Final   Special Requests   Final    NONE Performed at [facility], [address], [place], [place] [address]    Culture >=100,000 COLONIES/mL ENTEROCOCCUS AVIUM (A)  Final   Report Status 02/28/2018 FINAL  Final   Organism ID, Bacteria ENTEROCOCCUS AVIUM (A)  Final      Susceptibility   Enterococcus avium - MIC*    AMPICILLIN <=2 SENSITIVE Sensitive     LEVOFLOXACIN 4 INTERMEDIATE Intermediate     NITROFURANTOIN 32 SENSITIVE Sensitive     VANCOMYCIN <=0.5 SENSITIVE Sensitive     * >=100,000 COLONIES/mL ENTEROCOCCUS AVIUM  Gram stain     Status: None   Collection Time: 02/27/18  9:31 AM  Result Value Ref Range Status   Specimen Description PERITONEAL CAVITY  Final   Special Requests NONE  Final   Gram Stain   Final    CYTOSPIN SMEAR WBC PRESENT, PREDOMINANTLY MONONUCLEAR NO ORGANISMS SEEN Performed at [facility], [place] [address], [place], [place] [address]    Report Status 02/27/2018 FINAL  Final  Culture, body fluid-bottle     Status: None (Preliminary result)   Collection Time: 02/27/18  9:31 AM  Result Value Ref Range Status   Specimen Description PERITONEAL CAVITY  Final   Special Requests NONE  Final   Culture   Final    NO GROWTH < 24 HOURS Performed at [facility], [place] [address], [place], [place] [address]    Report Status PENDING  Incomplete     Labs: Basic Metabolic Panel: Recent Labs  Lab 02/25/18 1538 02/26/18 0845 02/27/18 0541 02/28/18 0453 03/01/18 0315  NA 133* 133* 134* 138 138  K 3.6 3.2* 3.3*  3.8 3.9  CL 91* 92* 93* 96* 96*  CO2 31 30 30  32 31  GLUCOSE 137* 82 89 82 86  BUN 68* 65* 68* 63* 61*  CREATININE 2.21* 2.07* 2.26* 1.83* 1.73*  CALCIUM 8.5* 7.9* 8.0* 8.4* 8.6*   Liver  Function Tests: Recent Labs  Lab 02/25/18 1538 02/27/18 0541  AST 27 20  ALT 20 18  ALKPHOS 137* 119  BILITOT 1.4* 1.1  PROT 4.8* 4.1*  ALBUMIN 2.5* 2.3*   No results for input(s): LIPASE, AMYLASE in the last 168 hours. Recent Labs  Lab 02/25/18 2113 02/27/18 0625  AMMONIA 46* 72*   CBC: Recent Labs  Lab 02/25/18 1538 02/26/18 0845 02/27/18 0541 02/28/18 0453 03/01/18 0315  WBC 5.9 3.8* 4.4 4.5 6.4  HGB 7.5* 7.5* 7.6* 8.2* 8.6*  HCT 24.0* 23.3* 24.3* 26.4* 28.2*  MCV 111.6* 105.4* 106.1* 108.2* 109.3*  PLT 92* 72* 74* 78* 89*   Cardiac Enzymes: No results for input(s): CKTOTAL, CKMB, CKMBINDEX, TROPONINI in the last 168 hours. BNP: BNP (last 3 results) Recent Labs    09/08/17 1107 09/16/17 1253 02/25/18 1538  BNP 144.3* 157.0* 283.7*    ProBNP (last 3 results) No results for input(s): PROBNP in the last 8760 hours.  CBG: Recent Labs  Lab 02/25/18 1607  GLUCAP 119*       Signed:  Florencia Reasons MD, PhD  [facility] 03/01/2018, 9:51 AM

## 2018-03-01 NOTE — Progress Notes (Signed)
Pt & husband given discharge instructions, prescriptions, and care notes. Pt verbalized understanding AEB no further questions or concerns at this time. IV was discontinued, no redness, pain, or swelling noted at this time. Pt left the floor via wheelchair with staff in stable condition.

## 2018-03-02 NOTE — Progress Notes (Signed)
Hospital follow up  Assessment and Plan: Hospital visit follow up for: dyspnea secondary to ascites consequent of NASH/cirrhosis Hospital discharge meds were reviewed, and reconciled with the patient.   Liver cirrhosis secondary to nonalcoholic steatohepatitis (NASH) (HCC) Continue current medications, needs to schedule with Dr. Havery Moros ASAP, husband aggrees to schedule, defer medication adjustment and pain management to him from here on out due to advanced disease  Hepatic encephalopathy ([place]) -     Ammonia  CKD (chronic kidney disease), stage III ([place]) -     BASIC METABOLIC PANEL WITH GFR  Thrombocytopenia (HCC) -     CBC with Differential/Platelet  Symptomatic anemia -     CBC with Differential/Platelet -     POC Hemoccult Bld/Stl (3-Cd Home Screen); Future  Pressure injury of sacral region, stage 1 Discussed at length with husband; monitor closely, change positions q2h, keep skin dry, currently no break in skin -     CBC with Differential/Platelet  Other chronic pain Can try topical lidocaine patches to residual pain from tap site -     traMADol (ULTRAM) 50 MG tablet; Take 50 mg by mouth every 12 hours as needed for pain for chronic pain  Over 40 minutes of exam, counseling, chart review, and complex, high/moderate level critical decision making was performed this visit.   Future Appointments  Date Time Provider [place]  03/11/2018 11:45 AM Marshell Garfinkel, MD [facility] None  04/08/2018  2:30 PM Unk Pinto, MD [facility] None  05/02/2018 10:15 AM Evelina Bucy, DPM [facility]  05/08/2018 11:00 AM Liane Comber, NP [facility] None  06/09/2018 10:00 AM Vicie Mutters, PA-C [facility] None  11/14/2018  3:00 PM [facility] None  11/14/2018  3:30 PM Truitt Merle, MD [facility] None     HPI 72 y.o.female presents for follow up for transition from recent hospitalization or SNIF stay. Admit date to the hospital was 02/25/18, patient  was discharged from the hospital on 03/01/18 and our clinical staff contacted the office the day after discharge to set up a follow up appointment. The discharge summary, medications, and diagnostic test results were reviewed before meeting with the patient. The patient was admitted for:  Dyspnea secondary to decompensated NASH/cirrhosis and ascites  Brenee Gajda Wyrickis a 72 y.o.femalewith medical history significant ofchronic kidney disease, COPD, advanced cirrhosis of the liver due to [facility], hypertension who presented to the ED due to swelling increased abdominal girth and worsening shortness of breath. She was noted to have gained over 10# the preceding week. She was admitted for need of repeat centesis. IR was consulted for therapeutic para with 500 cc removed; noted to be cloudy with wbc 85. She then received albumin infusion after thoracentesis per GI recommendation, she was transitioned from BB to midodrine, and consequent of worsening renal function (baseline Cr 1.2-1.3, cr 2.21 on 7/23, improved to 1.73 on 7/27), diuretics were adjusted - zaroxolyn continued, spironolactone dose reduced, demadex d/c'd. She will be following up with Dr. Havery Moros for further diuretic adjustments, reports has not made this appointment yet. Emphasized needs to do this ASAP.   She was noted to have slower mentation upon admission but oriented x3; she is treated by lactulose as she is unable to afford rifaximin. Aterixis resolved as of discharge, and was able to participate in PT; home health has been arranged.  She has recent hx of GI bleed in 09/2016 with history of grade 1 esophageal varices and moderate portal gastropathy; while admitted, noted hgb of 7.5 on  7/23 and received prbc transfusion x 1 on admission. Hgb above 8 x 2 prior to discharge; on review she has been in hgb 7-8 range for several months. She is on iron supplement, managed by GI.   She was noted to have right upper extremity edema upon  admission; Venous US negative for DVT but limited due to shoulder pain. 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 She did report new right shoulder and cervical pain that was new, cervical and right shoulder xrays showed no acute findings,  Edema improved, pain is minimal today. Discussed lymphedema wrap for management.   Stage I sacral decubitus ulcer, presented on admission, husband has been monitoring, doing regular position changes q2h, discussed monitoring and regular dressing changes, keeping dry.   She mainly reports ongoing discomfort with distended abdomen, some residual tenderness/pain to para site.  Minimal edema to extremities.   Wt Readings from Last 3 Encounters:  03/03/18 190 lb (86.2 kg)  02/26/18 186 lb 15.2 oz (84.8 kg)  02/19/18 185 lb (83.9 kg)    Home health is involved.   Images while in the hospital: Dg Chest 2 View  Result Date: 02/25/2018 CLINICAL DATA:  Short of breath on exertion EXAM: CHEST - 2 VIEW COMPARISON:  12/25/2017, 09/27/2017 FINDINGS: Postsurgical changes in the cervical spine. Tiny pleural effusions. No focal consolidation. Borderline to mild cardiomegaly. Aortic atherosclerosis. No pneumothorax. Treated compression deformity of the lower spine. Right shoulder replacement. IMPRESSION: Tiny pleural effusions.  Stable borderline to mild cardiomegaly. Electronically Signed   By: Donavan Foil M.D.   On: 02/25/2018 21:10    Past Medical History:  Diagnosis Date  . Asthma   . Cancer [facility])    liver cancer   . COPD (chronic obstructive pulmonary disease) ([place])    x 3 years   . Depression   . Diabetes mellitus without complication ([place])    diet controlled  not on meds   . Edema    left leg   . Esophageal varices ([place])   . Fibromyalgia   . Gastritis   . GERD (gastroesophageal reflux disease)   . Hepatic cirrhosis ([place])   . Hepatic encephalopathy ([place])   .  History of blood transfusion   . Hyperlipidemia   . Hypertension   . IBS (irritable bowel syndrome)   . Phlebitis    left leg x 2   . Pneumonia    hx of   . Splenomegaly   . Vitamin D deficiency      Allergies  Allergen Reactions  . Atorvastatin Other (See Comments)    Unknown reaction  . Diphenhydramine Hcl (Sleep) Hives  . Hydrocodone-Acetaminophen Other (See Comments)    Unknown reaction  . Lopid [Gemfibrozil] Other (See Comments)    Unknown reaction  . Loratadine Hives  . Lorazepam Hives  . Simvastatin Other (See Comments)    Unknown reaction  . Sulfamethoxazole Hives  . Sulfonamide Derivatives Hives      Current Outpatient Medications on File Prior to Visit  Medication Sig Dispense Refill  . albuterol (PROVENTIL HFA;VENTOLIN HFA) 108 (90 Base) MCG/ACT inhaler Inhale 2 puffs into the lungs every 6 (six) hours as needed for wheezing or shortness of breath. 1 Inhaler 2  . allopurinol (ZYLOPRIM) 300 MG tablet take 1 tablet (300 mg) by mouth daily with supper - for gout 30 tablet 0  . ALPRAZolam (XANAX) 0.25 MG tablet Take 1 tab  as needed for anxiety, cramping, air hunger every 8 hours as needed. 90 tablet 0  . aspirin EC 81 MG tablet Take 81 mg by mouth at bedtime.    . Cholecalciferol (VITAMIN D3) 5000 units CAPS Take 5,000 Units by mouth daily with breakfast.    . citalopram (CELEXA) 40 MG tablet Take 1 tablet (40 mg total) by mouth daily. 30 tablet 0  . cyclobenzaprine (FLEXERIL) 10 MG tablet Take 1 tablet (10 mg total) by mouth 3 (three) times daily as needed for muscle spasms. 90 tablet 2  . ferrous sulfate 325 (65 FE) MG tablet Take 1 tablet (325 mg total) by mouth daily with breakfast. 30 tablet 0  . gabapentin (NEURONTIN) 600 MG tablet Take 600 mg by mouth 3 (three) times daily.     . Glycopyrrolate-Formoterol (BEVESPI AEROSPHERE) 9-4.8 MCG/ACT AERO Inhale 2 puffs into the lungs 2 (two) times daily. 1 Inhaler 11  . ipratropium (ATROVENT) 0.02 % nebulizer solution  Take 3 mLs by nebulization every 4 (four) hours as needed for wheezing or shortness of breath.  1  . lactulose (CHRONULAC) 10 GM/15ML solution take 44ms TWICE DAILY (Patient taking differently: Take 20 g by mouth daily. ) 2700 mL 2  . Magnesium 250 MG TABS Take 250 mg by mouth at bedtime.     . metolazone (ZAROXOLYN) 5 MG tablet Take 1 tablet daily or as directed for fluid Overload 30 tablet 0  . midodrine (PROAMATINE) 5 MG tablet Take 1 tablet (5 mg total) by mouth 3 (three) times daily with meals. 90 tablet 0  . ondansetron (ZOFRAN) 4 MG tablet Take 1 tablet (4 mg total) by mouth every 6 (six) hours as needed for nausea or vomiting. 30 tablet 1  . OXYGEN Inhale 2-3 L into the lungs continuous. 3 L with exertion    . potassium chloride (K-DUR) 10 MEQ tablet Take 1 tablet (10 mEq total) by mouth daily. TAKE 2 TABLETS BY MOUTH 3 TIMES A DAY. 30 tablet 0  . rifampin (RIFADIN) 300 MG capsule Take 1 capsule by mouth 2 (two) times daily.    .Marland Kitchenspironolactone (ALDACTONE) 25 MG tablet Take 1 tablet (25 mg total) by mouth 2 (two) times daily. 60 tablet 0  . vitamin C (ASCORBIC ACID) 500 MG tablet Take 500 mg by mouth 3 (three) times daily.     No current facility-administered medications on file prior to visit.     ROS: all negative except above.   Physical Exam: Filed Weights   03/03/18 1611  Weight: 190 lb (86.2 kg)   BP (!) 112/58   Pulse 79   Temp (!) 97.3 F (36.3 C)   Ht 5' 2"  (1.575 m)   Wt 190 lb (86.2 kg) Comment: Patient reported  SpO2 98%   BMI 34.75 kg/m  General Appearance: Well nourished, in no apparent distress. Eyes: PERRLA, EOMs, conjunctiva no swelling or erythema ENT/Mouth: Ext aud canals clear, TMs without erythema, bulging. No erythema, swelling, or exudate on post pharynx.  Tonsils not swollen or erythematous. Hearing normal.  Neck: Supple, thyroid normal.  Respiratory: Respiratory effort normal, BS equal bilaterally without rales, rhonchi, wheezing or stridor.   Cardio: RRR with no MRGs. Intact distal pulses without notable LE edema Abdomen: firm, distended, + BS.  Generalized tenderness, worse to left mid/lateral around site of para, no guarding, excessive bruising. No atelactasis Lymphatics: Non tender without lymphadenopathy.  Musculoskeletal: Wheelchair bound, RUE with soft/puffy edema throughout  Skin: Warm, dry without rashes, lesions, ecchymosis.  Neuro: Cranial nerves intact. Normal muscle tone, no cerebellar symptoms. Sensation intact.  Psych: Awake and oriented X 3, normal affect, Insight and Judgment fair.     Izora Ribas, NP 5:52 PM [facility]

## 2018-03-03 ENCOUNTER — Ambulatory Visit (INDEPENDENT_AMBULATORY_CARE_PROVIDER_SITE_OTHER): Payer: PPO | Admitting: Adult Health

## 2018-03-03 ENCOUNTER — Other Ambulatory Visit: Payer: Self-pay

## 2018-03-03 ENCOUNTER — Encounter: Payer: Self-pay | Admitting: Adult Health

## 2018-03-03 VITALS — BP 112/58 | HR 79 | Temp 97.3°F | Ht 62.0 in | Wt 190.0 lb

## 2018-03-03 DIAGNOSIS — K7290 Hepatic failure, unspecified without coma: Secondary | ICD-10-CM | POA: Diagnosis not present

## 2018-03-03 DIAGNOSIS — L89151 Pressure ulcer of sacral region, stage 1: Secondary | ICD-10-CM | POA: Diagnosis not present

## 2018-03-03 DIAGNOSIS — K7460 Unspecified cirrhosis of liver: Secondary | ICD-10-CM

## 2018-03-03 DIAGNOSIS — D696 Thrombocytopenia, unspecified: Secondary | ICD-10-CM | POA: Diagnosis not present

## 2018-03-03 DIAGNOSIS — K7682 Hepatic encephalopathy: Secondary | ICD-10-CM

## 2018-03-03 DIAGNOSIS — K7581 Nonalcoholic steatohepatitis (NASH): Secondary | ICD-10-CM

## 2018-03-03 DIAGNOSIS — G8929 Other chronic pain: Secondary | ICD-10-CM

## 2018-03-03 DIAGNOSIS — N183 Chronic kidney disease, stage 3 unspecified: Secondary | ICD-10-CM

## 2018-03-03 DIAGNOSIS — D649 Anemia, unspecified: Secondary | ICD-10-CM | POA: Diagnosis not present

## 2018-03-03 MED ORDER — TRAMADOL HCL 50 MG PO TABS: ORAL_TABLET | ORAL | 0 refills | Status: DC

## 2018-03-03 NOTE — Patient Instructions (Signed)
If your arm swelling is getting worse/bothersome, look for edema sleeve (available in medical supply stores or online)  Can try applying lidocaine patches to abdomen/flank where tenderness is superficial  Schedule a follow up with Dr. Havery Moros ASAP     Edema Edema is an abnormal buildup of fluids in your bodytissues. Edema is somewhatdependent on gravity to pull the fluid to the lowest place in your body. That makes the condition more common in the legs and thighs (lower extremities). Painless swelling of the feet and ankles is common and becomes more likely as you get older. It is also common in looser tissues, like around your eyes. When the affected area is squeezed, the fluid may move out of that spot and leave a dent for a few moments. This dent is called pitting. What are the causes? There are many possible causes of edema. Eating too much salt and being on your feet or sitting for a long time can cause edema in your legs and ankles. Hot weather may make edema worse. Common medical causes of edema include:  Heart failure.  Liver disease.  Kidney disease.  Weak blood vessels in your legs.  Cancer.  An injury.  Pregnancy.  Some medications.  Obesity.  What are the signs or symptoms? Edema is usually painless.Your skin may look swollen or shiny. How is this diagnosed? Your health care provider may be able to diagnose edema by asking about your medical history and doing a physical exam. You may need to have tests such as X-rays, an electrocardiogram, or blood tests to check for medical conditions that may cause edema. How is this treated? Edema treatment depends on the cause. If you have heart, liver, or kidney disease, you need the treatment appropriate for these conditions. General treatment may include:  Elevation of the affected body part above the level of your heart.  Compression of the affected body part. Pressure from elastic bandages or support stockings  squeezes the tissues and forces fluid back into the blood vessels. This keeps fluid from entering the tissues.  Restriction of fluid and salt intake.  Use of a water pill (diuretic). These medications are appropriate only for some types of edema. They pull fluid out of your body and make you urinate more often. This gets rid of fluid and reduces swelling, but diuretics can have side effects. Only use diuretics as directed by your health care provider.  Follow these instructions at home:  Keep the affected body part above the level of your heart when you are lying down.  Do not sit still or stand for prolonged periods.  Do not put anything directly under your knees when lying down.  Do not wear constricting clothing or garters on your upper legs.  Exercise your legs to work the fluid back into your blood vessels. This may help the swelling go down.  Wear elastic bandages or support stockings to reduce ankle swelling as directed by your health care provider.  Eat a low-salt diet to reduce fluid if your health care provider recommends it.  Only take medicines as directed by your health care provider. Contact a health care provider if:  Your edema is not responding to treatment.  You have heart, liver, or kidney disease and notice symptoms of edema.  You have edema in your legs that does not improve after elevating them.  You have sudden and unexplained weight gain. Get help right away if:  You develop shortness of breath or chest pain.  You  cannot breathe when you lie down.  You develop pain, redness, or warmth in the swollen areas.  You have heart, liver, or kidney disease and suddenly get edema.  You have a fever and your symptoms suddenly get worse. This information is not intended to replace advice given to you by your health care provider. Make sure you discuss any questions you have with your health care provider. Document Released: 07/23/2005 Document Revised:  12/29/2015 Document Reviewed: 05/15/2013 Elsevier Interactive Patient Education  2017 [employer].

## 2018-03-04 ENCOUNTER — Other Ambulatory Visit: Payer: Self-pay

## 2018-03-04 DIAGNOSIS — Z9981 Dependence on supplemental oxygen: Secondary | ICD-10-CM | POA: Diagnosis not present

## 2018-03-04 DIAGNOSIS — M797 Fibromyalgia: Secondary | ICD-10-CM | POA: Diagnosis not present

## 2018-03-04 DIAGNOSIS — E1122 Type 2 diabetes mellitus with diabetic chronic kidney disease: Secondary | ICD-10-CM | POA: Diagnosis not present

## 2018-03-04 DIAGNOSIS — J9611 Chronic respiratory failure with hypoxia: Secondary | ICD-10-CM | POA: Diagnosis not present

## 2018-03-04 DIAGNOSIS — E662 Morbid (severe) obesity with alveolar hypoventilation: Secondary | ICD-10-CM | POA: Diagnosis not present

## 2018-03-04 DIAGNOSIS — K219 Gastro-esophageal reflux disease without esophagitis: Secondary | ICD-10-CM | POA: Diagnosis not present

## 2018-03-04 DIAGNOSIS — J449 Chronic obstructive pulmonary disease, unspecified: Secondary | ICD-10-CM | POA: Diagnosis not present

## 2018-03-04 DIAGNOSIS — J9612 Chronic respiratory failure with hypercapnia: Secondary | ICD-10-CM | POA: Diagnosis not present

## 2018-03-04 DIAGNOSIS — C220 Liver cell carcinoma: Secondary | ICD-10-CM | POA: Diagnosis not present

## 2018-03-04 DIAGNOSIS — I8500 Esophageal varices without bleeding: Secondary | ICD-10-CM | POA: Diagnosis not present

## 2018-03-04 DIAGNOSIS — K7581 Nonalcoholic steatohepatitis (NASH): Secondary | ICD-10-CM | POA: Diagnosis not present

## 2018-03-04 DIAGNOSIS — K5730 Diverticulosis of large intestine without perforation or abscess without bleeding: Secondary | ICD-10-CM | POA: Diagnosis not present

## 2018-03-04 DIAGNOSIS — I872 Venous insufficiency (chronic) (peripheral): Secondary | ICD-10-CM | POA: Diagnosis not present

## 2018-03-04 DIAGNOSIS — I130 Hypertensive heart and chronic kidney disease with heart failure and stage 1 through stage 4 chronic kidney disease, or unspecified chronic kidney disease: Secondary | ICD-10-CM | POA: Diagnosis not present

## 2018-03-04 DIAGNOSIS — E785 Hyperlipidemia, unspecified: Secondary | ICD-10-CM | POA: Diagnosis not present

## 2018-03-04 DIAGNOSIS — R180 Malignant ascites: Secondary | ICD-10-CM | POA: Diagnosis not present

## 2018-03-04 DIAGNOSIS — N183 Chronic kidney disease, stage 3 (moderate): Secondary | ICD-10-CM | POA: Diagnosis not present

## 2018-03-04 DIAGNOSIS — Z6830 Body mass index (BMI) 30.0-30.9, adult: Secondary | ICD-10-CM | POA: Diagnosis not present

## 2018-03-04 DIAGNOSIS — F329 Major depressive disorder, single episode, unspecified: Secondary | ICD-10-CM | POA: Diagnosis not present

## 2018-03-04 DIAGNOSIS — I5033 Acute on chronic diastolic (congestive) heart failure: Secondary | ICD-10-CM | POA: Diagnosis not present

## 2018-03-04 DIAGNOSIS — K7290 Hepatic failure, unspecified without coma: Secondary | ICD-10-CM | POA: Diagnosis not present

## 2018-03-04 DIAGNOSIS — D631 Anemia in chronic kidney disease: Secondary | ICD-10-CM | POA: Diagnosis not present

## 2018-03-04 LAB — CBC WITH DIFFERENTIAL/PLATELET
Basophils Absolute: 31 cells/uL (ref 0–200)
Basophils Relative: 0.6 %
Eosinophils Absolute: 281 cells/uL (ref 15–500)
Eosinophils Relative: 5.4 %
HCT: 24.8 % — ABNORMAL LOW (ref 35.0–45.0)
Hemoglobin: 8.4 g/dL — ABNORMAL LOW (ref 11.7–15.5)
Lymphs Abs: 666 cells/uL — ABNORMAL LOW (ref 850–3900)
MCH: 33.7 pg — ABNORMAL HIGH (ref 27.0–33.0)
MCHC: 33.9 g/dL (ref 32.0–36.0)
MCV: 99.6 fL (ref 80.0–100.0)
MPV: 11.8 fL (ref 7.5–12.5)
Monocytes Relative: 6.6 %
Neutro Abs: 3879 cells/uL (ref 1500–7800)
Neutrophils Relative %: 74.6 %
Platelets: 75 10*3/uL — ABNORMAL LOW (ref 140–400)
RBC: 2.49 10*6/uL — ABNORMAL LOW (ref 3.80–5.10)
RDW: 16.5 % — ABNORMAL HIGH (ref 11.0–15.0)
Total Lymphocyte: 12.8 %
WBC mixed population: 343 cells/uL (ref 200–950)
WBC: 5.2 10*3/uL (ref 3.8–10.8)

## 2018-03-04 LAB — BASIC METABOLIC PANEL WITH GFR
BUN/Creatinine Ratio: 33 (calc) — ABNORMAL HIGH (ref 6–22)
BUN: 42 mg/dL — AB (ref 7–25)
CALCIUM: 8.2 mg/dL — AB (ref 8.6–10.4)
CHLORIDE: 99 mmol/L (ref 98–110)
CO2: 33 mmol/L — ABNORMAL HIGH (ref 20–32)
CREATININE: 1.28 mg/dL — AB (ref 0.60–0.93)
GFR, EST AFRICAN AMERICAN: 49 mL/min/{1.73_m2} — AB (ref >=60)
GFR, EST NON AFRICAN AMERICAN: 42 mL/min/{1.73_m2} — AB (ref >=60)
Glucose, Bld: 116 mg/dL — ABNORMAL HIGH (ref 65–99)
Potassium: 4.3 mmol/L (ref 3.5–5.3)
Sodium: 137 mmol/L (ref 135–146)

## 2018-03-04 LAB — CULTURE, BODY FLUID W GRAM STAIN -BOTTLE: Culture: NO GROWTH

## 2018-03-04 LAB — AMMONIA: Ammonia: 121 umol/L — ABNORMAL HIGH (ref <=72)

## 2018-03-04 LAB — CULTURE, BODY FLUID-BOTTLE

## 2018-03-04 MED ORDER — ALLOPURINOL 300 MG PO TABS: ORAL_TABLET | ORAL | 0 refills | Status: AC

## 2018-03-04 MED ORDER — PANTOPRAZOLE SODIUM 40 MG PO TBEC: 40.0000 mg | DELAYED_RELEASE_TABLET | Freq: Every day | ORAL | 0 refills | Status: DC

## 2018-03-05 ENCOUNTER — Other Ambulatory Visit: Payer: Self-pay

## 2018-03-05 ENCOUNTER — Telehealth: Payer: Self-pay | Admitting: Gastroenterology

## 2018-03-05 DIAGNOSIS — K7460 Unspecified cirrhosis of liver: Secondary | ICD-10-CM

## 2018-03-05 NOTE — Telephone Encounter (Signed)
[name] can you please clarify what she is taking. Based on most recent note she should be on the following: - midodrine 50m TID - metolazone 539mdaily - aldactone 5035maily  She should be OFF the bisoprolol otherwise  If she is on this regimen as above, her recent renal function looked much better 2 days ago. I would increase aldactone to 100m35mily to see if that helps and plan on repeating BMET on Monday. Can you let them know? thanks

## 2018-03-05 NOTE — Telephone Encounter (Signed)
Spoke to patient's husband, patient is having increase in her weight. Saturday when released from hospital she weighed 187, today 193. She is having abdominal pain where fluid was drawn off while at the hospital, no difficulty breathing. Currently she is on Midodrine 5 mg TID. I have scheduled her to see AE at next available appointment on 03/13/18.

## 2018-03-05 NOTE — Telephone Encounter (Signed)
Spoke to husband, he did confirm that she is on the other medications as prescribed, OFF of bisoprolol. He will increase the aldactone to 50 mg BID and bring her in on Monday for recheck lab work. He understands to contact office if he has questions or concerns.

## 2018-03-06 ENCOUNTER — Other Ambulatory Visit: Payer: Self-pay | Admitting: *Deleted

## 2018-03-06 NOTE — Telephone Encounter (Signed)
Per Dr Melford Aase, it is OK for physical therapy 1 time this week and 2 times a week x 6 weeks. Mickel Baas is aware.

## 2018-03-07 ENCOUNTER — Emergency Department (HOSPITAL_COMMUNITY)
Admission: EM | Admit: 2018-03-07 | Discharge: 2018-03-07 | Disposition: A | Discharge status: Home / Self Care | Payer: PPO | Attending: Emergency Medicine | Admitting: Emergency Medicine

## 2018-03-07 ENCOUNTER — Other Ambulatory Visit: Payer: Self-pay

## 2018-03-07 ENCOUNTER — Encounter (HOSPITAL_COMMUNITY): Payer: Self-pay

## 2018-03-07 ENCOUNTER — Emergency Department (HOSPITAL_COMMUNITY): Payer: PPO

## 2018-03-07 DIAGNOSIS — R0602 Shortness of breath: Secondary | ICD-10-CM

## 2018-03-07 DIAGNOSIS — Z8505 Personal history of malignant neoplasm of liver: Secondary | ICD-10-CM | POA: Insufficient documentation

## 2018-03-07 DIAGNOSIS — R188 Other ascites: Secondary | ICD-10-CM | POA: Insufficient documentation

## 2018-03-07 DIAGNOSIS — E1122 Type 2 diabetes mellitus with diabetic chronic kidney disease: Secondary | ICD-10-CM | POA: Insufficient documentation

## 2018-03-07 DIAGNOSIS — Z87891 Personal history of nicotine dependence: Secondary | ICD-10-CM | POA: Insufficient documentation

## 2018-03-07 DIAGNOSIS — I5023 Acute on chronic systolic (congestive) heart failure: Secondary | ICD-10-CM | POA: Diagnosis not present

## 2018-03-07 DIAGNOSIS — N183 Chronic kidney disease, stage 3 (moderate): Secondary | ICD-10-CM | POA: Insufficient documentation

## 2018-03-07 DIAGNOSIS — R601 Generalized edema: Secondary | ICD-10-CM | POA: Diagnosis not present

## 2018-03-07 DIAGNOSIS — I110 Hypertensive heart disease with heart failure: Secondary | ICD-10-CM | POA: Diagnosis not present

## 2018-03-07 DIAGNOSIS — I130 Hypertensive heart and chronic kidney disease with heart failure and stage 1 through stage 4 chronic kidney disease, or unspecified chronic kidney disease: Secondary | ICD-10-CM | POA: Insufficient documentation

## 2018-03-07 DIAGNOSIS — Z7982 Long term (current) use of aspirin: Secondary | ICD-10-CM | POA: Insufficient documentation

## 2018-03-07 DIAGNOSIS — J449 Chronic obstructive pulmonary disease, unspecified: Secondary | ICD-10-CM | POA: Diagnosis not present

## 2018-03-07 DIAGNOSIS — Z79899 Other long term (current) drug therapy: Secondary | ICD-10-CM | POA: Insufficient documentation

## 2018-03-07 DIAGNOSIS — I5043 Acute on chronic combined systolic (congestive) and diastolic (congestive) heart failure: Secondary | ICD-10-CM | POA: Diagnosis not present

## 2018-03-07 DIAGNOSIS — I5042 Chronic combined systolic (congestive) and diastolic (congestive) heart failure: Secondary | ICD-10-CM | POA: Diagnosis not present

## 2018-03-07 LAB — BASIC METABOLIC PANEL
ANION GAP: 9 (ref 5–15)
BUN: 29 mg/dL — ABNORMAL HIGH (ref 8–23)
CHLORIDE: 101 mmol/L (ref 98–111)
CO2: 28 mmol/L (ref 22–32)
Calcium: 8.6 mg/dL — ABNORMAL LOW (ref 8.9–10.3)
Creatinine, Ser: 1.46 mg/dL — ABNORMAL HIGH (ref 0.44–1.00)
GFR calc non Af Amer: 35 mL/min — ABNORMAL LOW (ref >60)
GFR, EST AFRICAN AMERICAN: 41 mL/min — AB (ref >60)
Glucose, Bld: 121 mg/dL — ABNORMAL HIGH (ref 70–99)
POTASSIUM: 5 mmol/L (ref 3.5–5.1)
Sodium: 138 mmol/L (ref 135–145)

## 2018-03-07 LAB — CBC
HEMATOCRIT: 27.8 % — AB (ref 36.0–46.0)
HEMOGLOBIN: 8.2 g/dL — AB (ref 12.0–15.0)
MCH: 33.1 pg (ref 26.0–34.0)
MCHC: 29.5 g/dL — ABNORMAL LOW (ref 30.0–36.0)
MCV: 112.1 fL — AB (ref 78.0–100.0)
Platelets: 98 10*3/uL — ABNORMAL LOW (ref 150–400)
RBC: 2.48 MIL/uL — AB (ref 3.87–5.11)
RDW: 18.1 % — ABNORMAL HIGH (ref 11.5–15.5)
WBC: 5.3 10*3/uL (ref 4.0–10.5)

## 2018-03-07 LAB — I-STAT TROPONIN, ED: TROPONIN I, POC: 0.03 ng/mL (ref 0.00–0.08)

## 2018-03-07 MED ORDER — LIDOCAINE-EPINEPHRINE (PF) 2 %-1:200000 IJ SOLN
20.0000 mL | Freq: Once | INTRAMUSCULAR | Status: AC
  Administered 2018-03-07: 20 mL via INTRADERMAL
  Filled 2018-03-07: qty 20

## 2018-03-07 MED ORDER — FUROSEMIDE 10 MG/ML IJ SOLN
80.0000 mg | Freq: Once | INTRAMUSCULAR | Status: AC
  Administered 2018-03-07: 80 mg via INTRAVENOUS
  Filled 2018-03-07: qty 8

## 2018-03-07 NOTE — ED Provider Notes (Addendum)
Patient placed in Quick Look pathway, seen and evaluated   Chief Complaint: SOB, fluid overloaded  HPI:   72 year old female presents with volume overload, SOB, and abdominal pain and swelling. chronic kidney disease, COPD, advanced cirrhosis of the liver due to [facility], hypertension. Symptoms have gradually worsened since being released from the hospital on 7/27. She has gained 3 pounds in the past day. She has adhered to her diuretics but the dosage was decreased because of her kidney function. No fever or cough. "A little" chest pain.   ROS: +Excess fluid, +abdominal distention and pain  Physical Exam:   Gen: No distress. On portable O2. Chronically ill appearing  Neuro: Awake and Alert  Skin: Warm    Focused Exam: Heart: Regular rate and rhythm    Lungs: CTA   Initiation of care has begun. The patient has been counseled on the process, plan, and necessity for staying for the completion/evaluation, and the remainder of the medical screening examination        Recardo Evangelist, PA-C 03/07/18 1721    Duffy Bruce, MD 03/08/18 [phone]

## 2018-03-07 NOTE — ED Provider Notes (Signed)
[facility] EMERGENCY DEPARTMENT Provider Note   CSN: [id number] Arrival date & time: 03/07/18  1648     History   Chief Complaint Chief Complaint  Patient presents with  . Shortness of Breath    HPI [name] is a 72 y.o. female.  72 yo F with a chief complaint of shortness of breath.  This is also been going on with increased fluid overload.  The patient has a history of nonalcoholic cirrhosis has had worsening ascites for many months but worsening over the past week.  Was recently in the hospital and had a significant amount of fluid drawn off of her.  And unfortunately has recurred over a short time.  She denies any increased oxygen demand.  She is on 2-1/2 L at rest and 3 L on exertion.  Spends most of her time in the seated position.  The history is provided by the patient.  Shortness of Breath  This is a new problem. The average episode lasts 1 week. The problem occurs continuously.The current episode started more than 1 week ago. The problem has been gradually worsening. Associated symptoms include leg swelling. Pertinent negatives include no fever, no headaches, no rhinorrhea, no wheezing, no chest pain and no vomiting. She has tried nothing for the symptoms. The treatment provided no relief. She has had no prior hospitalizations. She has had prior ED visits. She has had no prior ICU admissions.    Past Medical History:  Diagnosis Date  . Asthma   . Cancer [facility])    liver cancer   . COPD (chronic obstructive pulmonary disease) ([place])    x 3 years   . Depression   . Diabetes mellitus without complication ([place])    diet controlled  not on meds   . Edema    left leg   . Esophageal varices ([place])   . Fibromyalgia   . Gastritis   . GERD (gastroesophageal reflux disease)   . Hepatic cirrhosis ([place])   . Hepatic encephalopathy ([place])   . History of blood transfusion   . Hyperlipidemia   . Hypertension   . IBS (irritable bowel syndrome)   . Phlebitis     left leg x 2   . Pneumonia    hx of   . Splenomegaly   . Vitamin D deficiency     Patient Active Problem List   Diagnosis Date Noted  . Pressure injury of skin 02/27/2018  . Iron deficiency anemia due to chronic blood loss   . Occult blood in stools   . Acute blood loss anemia 01/19/2018  . Chronic respiratory failure with hypoxia and hypercapnia ([place]) 12/26/2017  . Obesity hypoventilation syndrome ([place])   . Recurrent right pleural effusion   . NASH (nonalcoholic steatohepatitis)   . Liver cirrhosis secondary to nonalcoholic steatohepatitis (NASH) ([place])   . Chronic diastolic CHF (congestive heart failure) ([place])   . Macrocytic anemia   . CKD (chronic kidney disease), stage III ([place])   . Ascites 09/07/2017  . Hepatocellular carcinoma ([place]) 08/30/2017  . Anasarca 12/14/2016  . Obesity (BMI 30-39.9) 10/12/2016  . Hyperammonemia ([place]) 10/12/2016  . Atrial tachycardia, multifocal ([place])   . Chronic respiratory failure ([place]) 01/19/2016  . Hepatic encephalopathy ([place]) 09/21/2015  . Hypokalemia 09/21/2015  . COPD GOLD IV/ 02 dep  08/30/2015  . Thrombocytopenia ([place]) 08/07/2015  . Venous insufficiency 07/26/2015  . Goals of care, counseling/discussion 12/14/2013  . Essential hypertension   . Hyperlipidemia   . GERD   .  Vitamin D deficiency   . IBS   . Fibromyalgia   . Esophageal varices ([place]) 07/08/2012  . COLONIC POLYPS 11/11/2008  . Symptomatic anemia 05/28/2008  . Esophagitis 05/27/2008  . Diverticulosis of large intestine 05/27/2008    Past Surgical History:  Procedure Laterality Date  . ABDOMINAL HYSTERECTOMY    . BACK SURGERY    . CATARACT EXTRACTION, BILATERAL    . CHOLECYSTECTOMY    . ESOPHAGOGASTRODUODENOSCOPY  07/16/2012   Procedure: ESOPHAGOGASTRODUODENOSCOPY (EGD);  Surgeon: Inda Castle, MD;  Location: [facility];  Service: Endoscopy;  Laterality: N/A;  . ESOPHAGOGASTRODUODENOSCOPY N/A 01/20/2018   Procedure: ESOPHAGOGASTRODUODENOSCOPY (EGD);   Surgeon: Ladene Artist, MD;  Location: [facility] ENDOSCOPY;  Service: Endoscopy;  Laterality: N/A;  . GASTRIC VARICES BANDING  07/16/2012   Procedure: GASTRIC VARICES BANDING;  Surgeon: Inda Castle, MD;  Location: [facility] ENDOSCOPY;  Service: Endoscopy;  Laterality: N/A;  . HIP ARTHROPLASTY Bilateral   . IR GENERIC HISTORICAL  06/25/2016   IR RADIOLOGIST EVAL & MGMT 06/25/2016 [facility]INTERV RAD  . IR GENERIC HISTORICAL  06/27/2016   IR KYPHO LUMBAR INC FX REDUCE BONE BX UNI/BIL CANNULATION INC/IMAGING 06/27/2016 Luanne Bras, MD [facility]INTER[facility]  . IR GENERIC HISTORICAL  07/17/2016   IR RADIOLOGIST EVAL & MGMT 07/17/2016 [facility]  . IR PARACENTESIS  01/20/2018  . IR PARACENTESIS  02/27/2018  . IR RADIOLOGIST EVAL & MGMT  07/10/2017  . IR RADIOLOGIST EVAL & MGMT  10/01/2017  . IR RADIOLOGIST EVAL & MGMT  12/11/2017  . IR THORACENTESIS ASP PLEURAL SPACE W/IMG GUIDE  09/12/2017  . IR THORACENTESIS ASP PLEURAL SPACE W/IMG GUIDE  09/17/2017  . NECK SURGERY    . RADIOFREQUENCY ABLATION N/A 08/30/2017   Procedure: CT MICROWAVE THERMAL ABLATION;  Surgeon: Arne Cleveland, MD;  Location: [facility] ORS;  Service: Anesthesiology;  Laterality: N/A;  . SHOULDER SURGERY Right      OB History   None      Home Medications    Prior to Admission medications   Medication Sig Start Date End Date Taking? Authorizing Provider  albuterol (PROVENTIL HFA;VENTOLIN HFA) 108 (90 Base) MCG/ACT inhaler Inhale 2 puffs into the lungs every 6 (six) hours as needed for wheezing or shortness of breath. 05/27/16  Yes Ward, Delice Bison, DO  allopurinol (ZYLOPRIM) 300 MG tablet take 1 tablet (300 mg) by mouth daily with supper - for gout 03/04/18  Yes Unk Pinto, MD  ALPRAZolam Duanne Moron) 0.25 MG tablet Take 1 tab as needed for anxiety, cramping, air hunger every 8 hours as needed. 11/01/17  Yes Liane Comber, NP  aspirin EC 81 MG tablet Take 81 mg by mouth at bedtime.   Yes [provider]  Cholecalciferol (VITAMIN D3) 5000  units CAPS Take 5,000 Units by mouth daily with breakfast.   Yes [provider]  citalopram (CELEXA) 40 MG tablet Take 1 tablet (40 mg total) by mouth daily. 03/01/18  Yes Florencia Reasons, MD  cyclobenzaprine (FLEXERIL) 10 MG tablet Take 1 tablet (10 mg total) by mouth 3 (three) times daily as needed for muscle spasms. 03/06/16  Yes Unk Pinto, MD  ferrous sulfate 325 (65 FE) MG tablet Take 1 tablet (325 mg total) by mouth daily with breakfast. 03/01/18  Yes Florencia Reasons, MD  gabapentin (NEURONTIN) 600 MG tablet Take 600 mg by mouth 3 (three) times daily.    Yes [provider]  Glycopyrrolate-Formoterol (BEVESPI AEROSPHERE) 9-4.8 MCG/ACT AERO Inhale 2 puffs into the lungs 2 (two) times daily. 12/25/17  Yes Tanda Rockers, MD  ipratropium (ATROVENT) 0.02 % nebulizer solution Take 3 mLs by nebulization every 4 (four) hours as needed for wheezing or shortness of breath. 01/29/18  Yes [provider]  lactulose (CHRONULAC) 10 GM/15ML solution take 74ms TWICE DAILY Patient taking differently: Take 20 g by mouth daily.  08/08/17  Yes MUnk Pinto MD  Magnesium 250 MG TABS Take 250 mg by mouth at bedtime.    Yes [provider]  metolazone (ZAROXOLYN) 5 MG tablet Take 1 tablet daily or as directed for fluid Overload 02/20/18  Yes MUnk Pinto MD  midodrine (PROAMATINE) 5 MG tablet Take 1 tablet (5 mg total) by mouth 3 (three) times daily with meals. 03/01/18  Yes XFlorencia Reasons MD  ondansetron (ZOFRAN) 4 MG tablet Take 1 tablet (4 mg total) by mouth every 6 (six) hours as needed for nausea or vomiting. 09/02/17  Yes CVicie Mutters PA-C  OXYGEN Inhale 2-3 L into the lungs continuous. 3 L with exertion   Yes [provider]  pantoprazole (PROTONIX) 40 MG tablet Take 1 tablet (40 mg total) by mouth daily. 03/04/18  Yes MUnk Pinto MD  potassium chloride (K-DUR) 10 MEQ tablet Take 1 tablet (10 mEq total) by mouth daily. TAKE 2 TABLETS BY MOUTH 3 TIMES A DAY. Patient  taking differently: Take 20 mEq by mouth 3 (three) times daily.  03/01/18  Yes XFlorencia Reasons MD  rifampin (RIFADIN) 300 MG capsule Take 1 capsule by mouth 2 (two) times daily.   Yes [provider]  spironolactone (ALDACTONE) 25 MG tablet Take 1 tablet (25 mg total) by mouth 2 (two) times daily. 03/01/18  Yes XFlorencia Reasons MD  traMADol (Veatrice Bourbon 50 MG tablet Take 50 mg by mouth every 12 hours as needed for pain for chronic pain 03/03/18  Yes CLiane Comber NP  vitamin C (ASCORBIC ACID) 500 MG tablet Take 500 mg by mouth 3 (three) times daily.   Yes [provider]    Family History Family History  Problem Relation Age of Onset  . Diabetes Brother        deceased  . Heart disease Sister        A Fib  . Heart disease Mother        CHF  . Atrial fibrillation Sister   . Cancer Father        lung cancer   . Cancer Maternal Uncle        unknown type cancer   . Cancer Other        lung cancer   . Colon cancer Neg Hx     Social History Social History   Tobacco Use  . Smoking status: Former Smoker    Packs/day: 1.00    Years: 50.00    Pack years: 50.00    Types: Cigarettes    Last attempt to quit: 11/05/2014    Years since quitting: 3.3  . Smokeless tobacco: Never Used  . Tobacco comment: Quit April 2016  Substance Use Topics  . Alcohol use: No    Alcohol/week: 0.0 oz  . Drug use: No     Allergies   Atorvastatin; Diphenhydramine hcl (sleep); Hydrocodone-acetaminophen; Lopid [gemfibrozil]; Loratadine; Lorazepam; Simvastatin; Sulfamethoxazole; and Sulfonamide derivatives   Review of Systems Review of Systems  Constitutional: Negative for chills and fever.  HENT: Negative for congestion and rhinorrhea.   Eyes: Negative for redness and visual disturbance.  Respiratory: Positive for shortness of breath. Negative for wheezing.   Cardiovascular: Positive for  leg swelling. Negative for chest pain and palpitations.  Gastrointestinal: Positive for abdominal distention.  Negative for nausea and vomiting.  Genitourinary: Negative for dysuria and urgency.  Musculoskeletal: Negative for arthralgias and myalgias.  Skin: Negative for pallor and wound.  Neurological: Negative for dizziness and headaches.     Physical Exam Updated Vital Signs BP (!) 151/60   Pulse 92   Temp 98.8 F (37.1 C) (Oral)   Resp 17   Ht 5' 2"  (1.575 m)   Wt 88.9 kg (196 lb)   SpO2 100%   BMI 35.85 kg/m   Physical Exam  Constitutional: She is oriented to person, place, and time. She appears well-developed and well-nourished. No distress.  HENT:  Head: Normocephalic and atraumatic.  Eyes: Pupils are equal, round, and reactive to light. EOM are normal.  Neck: Normal range of motion. Neck supple. JVD (to the neck) present.  Cardiovascular: Normal rate and regular rhythm. Exam reveals no gallop and no friction rub.  No murmur heard. Pulmonary/Chest: Effort normal. She has no wheezes. She has rales (to the mid back).  Abdominal: Soft. She exhibits distension. There is no tenderness.  With fluid wave.   Musculoskeletal: She exhibits no edema or tenderness.  Neurological: She is alert and oriented to person, place, and time.  Skin: Skin is warm and dry. She is not diaphoretic.  Psychiatric: She has a normal mood and affect. Her behavior is normal.  Nursing note and vitals reviewed.    ED Treatments / Results  Labs (all labs ordered are listed, but only abnormal results are displayed) Labs Reviewed  BASIC METABOLIC PANEL - Abnormal; Notable for the following components:      Result Value   Glucose, Bld 121 (*)    BUN 29 (*)    Creatinine, Ser 1.46 (*)    Calcium 8.6 (*)    GFR calc non Af Amer 35 (*)    GFR calc Af Amer 41 (*)    All other components within normal limits  CBC - Abnormal; Notable for the following components:   RBC 2.48 (*)    Hemoglobin 8.2 (*)    HCT 27.8 (*)    MCV 112.1 (*)    MCHC 29.5 (*)    RDW 18.1 (*)    Platelets 98 (*)    All other  components within normal limits  I-STAT TROPONIN, ED    EKG EKG Interpretation  Date/Time:  Friday March 07 2018 16:56:26 EDT Ventricular Rate:  83 PR Interval:  144 QRS Duration: 118 QT Interval:  400 QTC Calculation: 470 R Axis:   -18 Text Interpretation:  Normal sinus rhythm Right bundle branch block Possible Inferior infarct , age undetermined Abnormal ECG No significant change since last tracing Confirmed by Deno Etienne [phone]) on 03/07/2018 7:42:33 PM   Radiology Dg Chest 2 View  Result Date: 03/07/2018 CLINICAL DATA:  Increased shortness of breath and fluid retention. Ex-smoker. EXAM: CHEST - 2 VIEW COMPARISON:  02/25/2018. FINDINGS: Borderline enlarged cardiac silhouette. Increased prominence of the pulmonary vasculature and interstitial markings with small bilateral pleural effusions, increased. Small calcified granuloma in the right upper lobe. Mild thoracic spine degenerative changes. Right shoulder prosthesis and fixation anchor. Cervical spine fixation hardware. IMPRESSION: Acute congestive heart failure. Electronically Signed   By: Claudie Revering M.D.   On: 03/07/2018 18:27    Procedures .Paracentesis Date/Time: 03/07/2018 11:44 PM Performed by: Deno Etienne, DO Authorized by: Deno Etienne, DO   Consent:    Consent obtained:  Written  Consent given by:  Patient   Risks discussed:  Bleeding, bowel perforation, infection and pain   Alternatives discussed:  No treatment, delayed treatment, alternative treatment, observation and referral Pre-procedure details:    Procedure purpose:  Therapeutic   Preparation: Patient was prepped and draped in usual sterile fashion   Anesthesia (see MAR for exact dosages):    Anesthesia method:  Local infiltration   Local anesthetic:  Lidocaine 2% WITH epi Procedure details:    Needle gauge:  18   Ultrasound guidance: yes     Puncture site:  R lower quadrant   Fluid removed amount:  2.5L   Fluid appearance:  Amber   Dressing:  Adhesive  bandage Post-procedure details:    Patient tolerance of procedure:  Tolerated well, no immediate complications   (including critical care time)  Medications Ordered in ED Medications  furosemide (LASIX) injection 80 mg (80 mg Intravenous Given 03/07/18 2205)  lidocaine-EPINEPHrine (XYLOCAINE W/EPI) 2 %-1:200000 (PF) injection 20 mL (20 mLs Intradermal Given by Other 03/07/18 2044)     Initial Impression / Assessment and Plan / ED Course  I have reviewed the triage vital signs and the nursing notes.  Pertinent labs & imaging results that were available during my care of the patient were reviewed by me and considered in my medical decision making (see chart for details).     72 yo F with a chief complaint of weight gain and shortness of breath.  The patient has a history of cirrhosis, she has had increasing fluid overload over the past week.  Had a 3 pound weight gain since yesterday.  No change in her oxygen at home though is uncomfortable.  I discussed her likely need to come to the hospital and the patient is a bit reluctant.  She is asking if I will do a paracentesis to see if that will improve her symptoms.  We will give 80 of Lasix will do a paracentesis at bedside and reassess.  Creatinine improved from her stay over the past week.  Hemoglobin stable.  Troponin negative.  Chest x-ray with increased edema as viewed by me.  Patient feeling better on reassessment.  Requesting trial of home care.  I discussed with her again that I felt most likely the right thing with her multiple comorbidities was coming to the hospital however will let the patient go home.  Have her increase her metolazone over the weekend and call her family doctor and gastroenterologist on Monday.  11:46 PM:  I have discussed the diagnosis/risks/treatment options with the patient and family and believe the pt to be eligible for discharge home to follow-up with PCP, GI. We also discussed returning to the ED immediately if new  or worsening sx occur. We discussed the sx which are most concerning (e.g., sudden worsening pain, fever, inability to tolerate by mouth, worsening sob) that necessitate immediate return. Medications administered to the patient during their visit and any new prescriptions provided to the patient are listed below.  Medications given during this visit Medications  furosemide (LASIX) injection 80 mg (80 mg Intravenous Given 03/07/18 2205)  lidocaine-EPINEPHrine (XYLOCAINE W/EPI) 2 %-1:200000 (PF) injection 20 mL (20 mLs Intradermal Given by Other 03/07/18 2044)      The patient appears reasonably screen and/or stabilized for discharge and I doubt any other medical condition or other [facility] requiring further screening, evaluation, or treatment in the ED at this time prior to discharge.    Final Clinical Impressions(s) / ED Diagnoses  Final diagnoses:  Anasarca  Other ascites  SOB (shortness of breath)  Acute on chronic combined systolic and diastolic congestive heart failure [facility])    ED Discharge Orders    None       Deno Etienne, DO 03/07/18 2346

## 2018-03-07 NOTE — ED Notes (Addendum)
IV team at bedside. Paracentesis consent signed and at bedside. Pt denied any further questions about procedure.

## 2018-03-07 NOTE — ED Notes (Signed)
ED Provider at bedside. 

## 2018-03-07 NOTE — ED Triage Notes (Signed)
Pt states that she was recently admitted for GI Bleed. States that since she has left she has been having increased SOB and increased fluid retention.

## 2018-03-07 NOTE — Discharge Instructions (Signed)
Double your metolazone over the weekend.  Call your doctor first thing on Monday.

## 2018-03-10 ENCOUNTER — Telehealth: Payer: Self-pay | Admitting: *Deleted

## 2018-03-10 ENCOUNTER — Emergency Department (HOSPITAL_COMMUNITY): Payer: PPO

## 2018-03-10 ENCOUNTER — Telehealth: Payer: Self-pay

## 2018-03-10 ENCOUNTER — Encounter (HOSPITAL_COMMUNITY): Payer: Self-pay | Admitting: Emergency Medicine

## 2018-03-10 ENCOUNTER — Telehealth: Payer: Self-pay | Admitting: Gastroenterology

## 2018-03-10 ENCOUNTER — Inpatient Hospital Stay (HOSPITAL_COMMUNITY)
Admission: EM | Admit: 2018-03-10 | Discharge: 2018-03-14 | DRG: 441 | Disposition: A | Discharge status: Home Health | Payer: PPO | Attending: Internal Medicine | Admitting: Internal Medicine

## 2018-03-10 ENCOUNTER — Other Ambulatory Visit: Payer: Self-pay

## 2018-03-10 DIAGNOSIS — Z9981 Dependence on supplemental oxygen: Secondary | ICD-10-CM

## 2018-03-10 DIAGNOSIS — M797 Fibromyalgia: Secondary | ICD-10-CM | POA: Diagnosis present

## 2018-03-10 DIAGNOSIS — C220 Liver cell carcinoma: Secondary | ICD-10-CM | POA: Diagnosis not present

## 2018-03-10 DIAGNOSIS — R627 Adult failure to thrive: Secondary | ICD-10-CM | POA: Diagnosis present

## 2018-03-10 DIAGNOSIS — Z882 Allergy status to sulfonamides status: Secondary | ICD-10-CM

## 2018-03-10 DIAGNOSIS — K7460 Unspecified cirrhosis of liver: Secondary | ICD-10-CM | POA: Diagnosis present

## 2018-03-10 DIAGNOSIS — Z87891 Personal history of nicotine dependence: Secondary | ICD-10-CM

## 2018-03-10 DIAGNOSIS — Z7189 Other specified counseling: Secondary | ICD-10-CM

## 2018-03-10 DIAGNOSIS — D6959 Other secondary thrombocytopenia: Secondary | ICD-10-CM | POA: Diagnosis present

## 2018-03-10 DIAGNOSIS — J449 Chronic obstructive pulmonary disease, unspecified: Secondary | ICD-10-CM | POA: Diagnosis present

## 2018-03-10 DIAGNOSIS — R188 Other ascites: Secondary | ICD-10-CM | POA: Diagnosis present

## 2018-03-10 DIAGNOSIS — Z79899 Other long term (current) drug therapy: Secondary | ICD-10-CM | POA: Diagnosis not present

## 2018-03-10 DIAGNOSIS — Z96643 Presence of artificial hip joint, bilateral: Secondary | ICD-10-CM | POA: Diagnosis present

## 2018-03-10 DIAGNOSIS — K3189 Other diseases of stomach and duodenum: Secondary | ICD-10-CM | POA: Diagnosis present

## 2018-03-10 DIAGNOSIS — R0602 Shortness of breath: Secondary | ICD-10-CM | POA: Diagnosis present

## 2018-03-10 DIAGNOSIS — R109 Unspecified abdominal pain: Secondary | ICD-10-CM | POA: Diagnosis present

## 2018-03-10 DIAGNOSIS — E8779 Other fluid overload: Secondary | ICD-10-CM | POA: Diagnosis not present

## 2018-03-10 DIAGNOSIS — K7581 Nonalcoholic steatohepatitis (NASH): Principal | ICD-10-CM | POA: Diagnosis present

## 2018-03-10 DIAGNOSIS — Z833 Family history of diabetes mellitus: Secondary | ICD-10-CM | POA: Diagnosis not present

## 2018-03-10 DIAGNOSIS — I10 Essential (primary) hypertension: Secondary | ICD-10-CM | POA: Diagnosis not present

## 2018-03-10 DIAGNOSIS — K766 Portal hypertension: Secondary | ICD-10-CM | POA: Diagnosis present

## 2018-03-10 DIAGNOSIS — Z8505 Personal history of malignant neoplasm of liver: Secondary | ICD-10-CM

## 2018-03-10 DIAGNOSIS — D696 Thrombocytopenia, unspecified: Secondary | ICD-10-CM | POA: Diagnosis present

## 2018-03-10 DIAGNOSIS — K7290 Hepatic failure, unspecified without coma: Secondary | ICD-10-CM | POA: Diagnosis present

## 2018-03-10 DIAGNOSIS — I959 Hypotension, unspecified: Secondary | ICD-10-CM | POA: Diagnosis present

## 2018-03-10 DIAGNOSIS — I130 Hypertensive heart and chronic kidney disease with heart failure and stage 1 through stage 4 chronic kidney disease, or unspecified chronic kidney disease: Secondary | ICD-10-CM | POA: Diagnosis present

## 2018-03-10 DIAGNOSIS — I5033 Acute on chronic diastolic (congestive) heart failure: Secondary | ICD-10-CM | POA: Diagnosis present

## 2018-03-10 DIAGNOSIS — K589 Irritable bowel syndrome without diarrhea: Secondary | ICD-10-CM | POA: Diagnosis present

## 2018-03-10 DIAGNOSIS — Z90710 Acquired absence of both cervix and uterus: Secondary | ICD-10-CM | POA: Diagnosis not present

## 2018-03-10 DIAGNOSIS — E8770 Fluid overload, unspecified: Secondary | ICD-10-CM | POA: Diagnosis not present

## 2018-03-10 DIAGNOSIS — I4510 Unspecified right bundle-branch block: Secondary | ICD-10-CM | POA: Diagnosis not present

## 2018-03-10 DIAGNOSIS — K2100 Gastro-esophageal reflux disease with esophagitis, without bleeding: Secondary | ICD-10-CM | POA: Diagnosis present

## 2018-03-10 DIAGNOSIS — Z7982 Long term (current) use of aspirin: Secondary | ICD-10-CM | POA: Diagnosis not present

## 2018-03-10 DIAGNOSIS — D649 Anemia, unspecified: Secondary | ICD-10-CM | POA: Diagnosis not present

## 2018-03-10 DIAGNOSIS — R1030 Lower abdominal pain, unspecified: Secondary | ICD-10-CM | POA: Diagnosis not present

## 2018-03-10 DIAGNOSIS — F329 Major depressive disorder, single episode, unspecified: Secondary | ICD-10-CM | POA: Diagnosis present

## 2018-03-10 DIAGNOSIS — E1122 Type 2 diabetes mellitus with diabetic chronic kidney disease: Secondary | ICD-10-CM | POA: Diagnosis present

## 2018-03-10 DIAGNOSIS — K219 Gastro-esophageal reflux disease without esophagitis: Secondary | ICD-10-CM | POA: Diagnosis present

## 2018-03-10 DIAGNOSIS — Z515 Encounter for palliative care: Secondary | ICD-10-CM

## 2018-03-10 DIAGNOSIS — N183 Chronic kidney disease, stage 3 unspecified: Secondary | ICD-10-CM | POA: Diagnosis present

## 2018-03-10 DIAGNOSIS — E559 Vitamin D deficiency, unspecified: Secondary | ICD-10-CM | POA: Diagnosis present

## 2018-03-10 DIAGNOSIS — R601 Generalized edema: Secondary | ICD-10-CM | POA: Diagnosis not present

## 2018-03-10 DIAGNOSIS — I5032 Chronic diastolic (congestive) heart failure: Secondary | ICD-10-CM | POA: Diagnosis present

## 2018-03-10 DIAGNOSIS — D500 Iron deficiency anemia secondary to blood loss (chronic): Secondary | ICD-10-CM | POA: Diagnosis present

## 2018-03-10 DIAGNOSIS — Z66 Do not resuscitate: Secondary | ICD-10-CM | POA: Diagnosis present

## 2018-03-10 DIAGNOSIS — E785 Hyperlipidemia, unspecified: Secondary | ICD-10-CM | POA: Diagnosis present

## 2018-03-10 DIAGNOSIS — D508 Other iron deficiency anemias: Secondary | ICD-10-CM | POA: Diagnosis not present

## 2018-03-10 DIAGNOSIS — F419 Anxiety disorder, unspecified: Secondary | ICD-10-CM | POA: Diagnosis present

## 2018-03-10 LAB — I-STAT TROPONIN, ED: Troponin i, poc: 0.02 ng/mL (ref 0.00–0.08)

## 2018-03-10 LAB — COMPREHENSIVE METABOLIC PANEL
ALT: 19 U/L (ref 0–44)
AST: 23 U/L (ref 15–41)
Albumin: 2.6 g/dL — ABNORMAL LOW (ref 3.5–5.0)
Alkaline Phosphatase: 116 U/L (ref 38–126)
Anion gap: 8 (ref 5–15)
BILIRUBIN TOTAL: 1.5 mg/dL — AB (ref 0.3–1.2)
BUN: 21 mg/dL (ref 8–23)
CHLORIDE: 101 mmol/L (ref 98–111)
CO2: 28 mmol/L (ref 22–32)
CREATININE: 1.45 mg/dL — AB (ref 0.44–1.00)
Calcium: 7.9 mg/dL — ABNORMAL LOW (ref 8.9–10.3)
GFR, EST AFRICAN AMERICAN: 41 mL/min — AB (ref >60)
GFR, EST NON AFRICAN AMERICAN: 35 mL/min — AB (ref >60)
Glucose, Bld: 120 mg/dL — ABNORMAL HIGH (ref 70–99)
POTASSIUM: 4.7 mmol/L (ref 3.5–5.1)
Sodium: 137 mmol/L (ref 135–145)
TOTAL PROTEIN: 4.9 g/dL — AB (ref 6.5–8.1)

## 2018-03-10 LAB — CBC
HCT: 27.9 % — ABNORMAL LOW (ref 36.0–46.0)
Hemoglobin: 8.3 g/dL — ABNORMAL LOW (ref 12.0–15.0)
MCH: 33.2 pg (ref 26.0–34.0)
MCHC: 29.7 g/dL — ABNORMAL LOW (ref 30.0–36.0)
MCV: 111.6 fL — ABNORMAL HIGH (ref 78.0–100.0)
PLATELETS: 115 10*3/uL — AB (ref 150–400)
RBC: 2.5 MIL/uL — AB (ref 3.87–5.11)
RDW: 17.5 % — AB (ref 11.5–15.5)
WBC: 5.1 10*3/uL (ref 4.0–10.5)

## 2018-03-10 LAB — LIPASE, BLOOD: LIPASE: 69 U/L — AB (ref 11–51)

## 2018-03-10 NOTE — Telephone Encounter (Signed)
Yes, I agree with ED today.  While DNR sounds like an important issue for him, it is not one I can address. Dr. Havery Moros will return tomorrow.

## 2018-03-10 NOTE — ED Triage Notes (Signed)
Pt c/o increased shortness of breath and abdominal pain/distension. Pt had 2L pulled off her abdomen on Friday, reports an 8lb weight gain since.

## 2018-03-10 NOTE — Telephone Encounter (Signed)
Home health nurse called just now, she is at patient's home. Patient had gone to the ED on Friday, had paracentesis. Patient weight has increased 5 lbs since then, is having increased abdominal pain, difficulty breathing, cannot move.   Dr. Havery Moros had increased patient's aldactone from 25 mg BID to 50 mg BID, nurse states she is only taking 25 mg BID. Again went over his order, asked that they make sure the husband understands medication list. She is also taking midodrine 5 mg TID, metolazone 5 mg daily. Patient also had some lasix at home and took that this morning, still has not urinated. I instructed them to send her to ED.  Nurse was concerned about patient not having updated DNR, over 22 year old. I know she sees numerous physicians, asked that they contact PCP who most likely did the other DNR. She states that husband is agreeable to hospice but patient is not ready to accept. I let her know that they do have a follow up in our office this Thursday with AE.

## 2018-03-10 NOTE — Telephone Encounter (Signed)
Home Health nurse, Sonia Baller, called and reported the patient's abdomen is very swollen and firm to the touch. She short of breath and has gained 5 # since being seen in the ED on 03/07/2018. Per Dr Melford Aase, the nurse should call Dr Doyne Keel office regarding her fluid retention.  Sonia Baller will contact their office.

## 2018-03-10 NOTE — Telephone Encounter (Signed)
FYI

## 2018-03-11 ENCOUNTER — Inpatient Hospital Stay (HOSPITAL_COMMUNITY): Payer: PPO

## 2018-03-11 ENCOUNTER — Encounter (HOSPITAL_COMMUNITY): Payer: Self-pay | Admitting: Physician Assistant

## 2018-03-11 ENCOUNTER — Ambulatory Visit: Payer: PPO | Admitting: Pulmonary Disease

## 2018-03-11 DIAGNOSIS — R109 Unspecified abdominal pain: Secondary | ICD-10-CM | POA: Diagnosis present

## 2018-03-11 DIAGNOSIS — I130 Hypertensive heart and chronic kidney disease with heart failure and stage 1 through stage 4 chronic kidney disease, or unspecified chronic kidney disease: Secondary | ICD-10-CM | POA: Diagnosis present

## 2018-03-11 DIAGNOSIS — R601 Generalized edema: Secondary | ICD-10-CM | POA: Diagnosis not present

## 2018-03-11 DIAGNOSIS — I959 Hypotension, unspecified: Secondary | ICD-10-CM | POA: Diagnosis present

## 2018-03-11 DIAGNOSIS — E1122 Type 2 diabetes mellitus with diabetic chronic kidney disease: Secondary | ICD-10-CM | POA: Diagnosis present

## 2018-03-11 DIAGNOSIS — Z79899 Other long term (current) drug therapy: Secondary | ICD-10-CM | POA: Diagnosis not present

## 2018-03-11 DIAGNOSIS — I5033 Acute on chronic diastolic (congestive) heart failure: Secondary | ICD-10-CM | POA: Diagnosis present

## 2018-03-11 DIAGNOSIS — Z9981 Dependence on supplemental oxygen: Secondary | ICD-10-CM | POA: Diagnosis not present

## 2018-03-11 DIAGNOSIS — K766 Portal hypertension: Secondary | ICD-10-CM | POA: Diagnosis present

## 2018-03-11 DIAGNOSIS — E559 Vitamin D deficiency, unspecified: Secondary | ICD-10-CM | POA: Diagnosis present

## 2018-03-11 DIAGNOSIS — Z8505 Personal history of malignant neoplasm of liver: Secondary | ICD-10-CM | POA: Diagnosis not present

## 2018-03-11 DIAGNOSIS — Z833 Family history of diabetes mellitus: Secondary | ICD-10-CM | POA: Diagnosis not present

## 2018-03-11 DIAGNOSIS — D696 Thrombocytopenia, unspecified: Secondary | ICD-10-CM | POA: Diagnosis not present

## 2018-03-11 DIAGNOSIS — Z7982 Long term (current) use of aspirin: Secondary | ICD-10-CM | POA: Diagnosis not present

## 2018-03-11 DIAGNOSIS — M797 Fibromyalgia: Secondary | ICD-10-CM | POA: Diagnosis present

## 2018-03-11 DIAGNOSIS — K210 Gastro-esophageal reflux disease with esophagitis: Secondary | ICD-10-CM

## 2018-03-11 DIAGNOSIS — N183 Chronic kidney disease, stage 3 (moderate): Secondary | ICD-10-CM | POA: Diagnosis present

## 2018-03-11 DIAGNOSIS — K7460 Unspecified cirrhosis of liver: Secondary | ICD-10-CM | POA: Diagnosis present

## 2018-03-11 DIAGNOSIS — E785 Hyperlipidemia, unspecified: Secondary | ICD-10-CM | POA: Diagnosis present

## 2018-03-11 DIAGNOSIS — D649 Anemia, unspecified: Secondary | ICD-10-CM | POA: Diagnosis not present

## 2018-03-11 DIAGNOSIS — F329 Major depressive disorder, single episode, unspecified: Secondary | ICD-10-CM | POA: Diagnosis present

## 2018-03-11 DIAGNOSIS — Z7189 Other specified counseling: Secondary | ICD-10-CM | POA: Diagnosis not present

## 2018-03-11 DIAGNOSIS — R188 Other ascites: Secondary | ICD-10-CM | POA: Diagnosis present

## 2018-03-11 DIAGNOSIS — I5032 Chronic diastolic (congestive) heart failure: Secondary | ICD-10-CM | POA: Diagnosis not present

## 2018-03-11 DIAGNOSIS — Z90710 Acquired absence of both cervix and uterus: Secondary | ICD-10-CM | POA: Diagnosis not present

## 2018-03-11 DIAGNOSIS — J449 Chronic obstructive pulmonary disease, unspecified: Secondary | ICD-10-CM | POA: Diagnosis present

## 2018-03-11 DIAGNOSIS — R0602 Shortness of breath: Secondary | ICD-10-CM | POA: Insufficient documentation

## 2018-03-11 DIAGNOSIS — E8770 Fluid overload, unspecified: Secondary | ICD-10-CM | POA: Insufficient documentation

## 2018-03-11 DIAGNOSIS — E8779 Other fluid overload: Secondary | ICD-10-CM

## 2018-03-11 DIAGNOSIS — D500 Iron deficiency anemia secondary to blood loss (chronic): Secondary | ICD-10-CM | POA: Diagnosis present

## 2018-03-11 DIAGNOSIS — I10 Essential (primary) hypertension: Secondary | ICD-10-CM

## 2018-03-11 DIAGNOSIS — K7581 Nonalcoholic steatohepatitis (NASH): Secondary | ICD-10-CM | POA: Diagnosis present

## 2018-03-11 DIAGNOSIS — R1030 Lower abdominal pain, unspecified: Secondary | ICD-10-CM | POA: Diagnosis not present

## 2018-03-11 DIAGNOSIS — Z515 Encounter for palliative care: Secondary | ICD-10-CM | POA: Diagnosis not present

## 2018-03-11 DIAGNOSIS — K219 Gastro-esophageal reflux disease without esophagitis: Secondary | ICD-10-CM | POA: Diagnosis present

## 2018-03-11 DIAGNOSIS — K7290 Hepatic failure, unspecified without coma: Secondary | ICD-10-CM | POA: Diagnosis present

## 2018-03-11 DIAGNOSIS — K589 Irritable bowel syndrome without diarrhea: Secondary | ICD-10-CM | POA: Diagnosis present

## 2018-03-11 HISTORY — PX: IR PARACENTESIS: IMG2679

## 2018-03-11 LAB — URINALYSIS, ROUTINE W REFLEX MICROSCOPIC
BILIRUBIN URINE: NEGATIVE
Glucose, UA: NEGATIVE mg/dL
Hgb urine dipstick: NEGATIVE
KETONES UR: NEGATIVE mg/dL
LEUKOCYTES UA: NEGATIVE
Nitrite: NEGATIVE
Protein, ur: NEGATIVE mg/dL
SPECIFIC GRAVITY, URINE: 1.013 (ref 1.005–1.030)
pH: 5 (ref 5.0–8.0)

## 2018-03-11 LAB — BASIC METABOLIC PANEL
Anion gap: 11 (ref 5–15)
BUN: 21 mg/dL (ref 8–23)
CO2: 25 mmol/L (ref 22–32)
Calcium: 8 mg/dL — ABNORMAL LOW (ref 8.9–10.3)
Chloride: 102 mmol/L (ref 98–111)
Creatinine, Ser: 1.44 mg/dL — ABNORMAL HIGH (ref 0.44–1.00)
GFR calc Af Amer: 41 mL/min — ABNORMAL LOW (ref >60)
GFR, EST NON AFRICAN AMERICAN: 36 mL/min — AB (ref >60)
Glucose, Bld: 105 mg/dL — ABNORMAL HIGH (ref 70–99)
POTASSIUM: 4.8 mmol/L (ref 3.5–5.1)
SODIUM: 138 mmol/L (ref 135–145)

## 2018-03-11 LAB — CBC
HEMATOCRIT: 24.9 % — AB (ref 36.0–46.0)
HEMOGLOBIN: 7.6 g/dL — AB (ref 12.0–15.0)
MCH: 33.3 pg (ref 26.0–34.0)
MCHC: 30.5 g/dL (ref 30.0–36.0)
MCV: 109.2 fL — ABNORMAL HIGH (ref 78.0–100.0)
Platelets: 110 10*3/uL — ABNORMAL LOW (ref 150–400)
RBC: 2.28 MIL/uL — ABNORMAL LOW (ref 3.87–5.11)
RDW: 17.3 % — ABNORMAL HIGH (ref 11.5–15.5)
WBC: 5.6 10*3/uL (ref 4.0–10.5)

## 2018-03-11 LAB — AMMONIA: AMMONIA: 84 umol/L — AB (ref 9–35)

## 2018-03-11 LAB — GRAM STAIN

## 2018-03-11 LAB — ALBUMIN, PLEURAL OR PERITONEAL FLUID: Albumin, Fluid: <1 g/dL

## 2018-03-11 LAB — LACTATE DEHYDROGENASE, PLEURAL OR PERITONEAL FLUID: LD, Fluid: 21 U/L (ref 3–23)

## 2018-03-11 LAB — PROTIME-INR
INR: 1.17
Prothrombin Time: 14.8 seconds (ref 11.4–15.2)

## 2018-03-11 LAB — APTT: aPTT: 27 seconds (ref 24–36)

## 2018-03-11 MED ORDER — LIDOCAINE HCL (PF) 2 % IJ SOLN
INTRAMUSCULAR | Status: AC
  Filled 2018-03-11: qty 20

## 2018-03-11 MED ORDER — MORPHINE SULFATE (PF) 2 MG/ML IV SOLN
1.0000 mg | INTRAVENOUS | Status: DC | PRN
  Administered 2018-03-11: 1 mg via INTRAVENOUS
  Filled 2018-03-11: qty 1

## 2018-03-11 MED ORDER — GABAPENTIN 600 MG PO TABS
600.0000 mg | ORAL_TABLET | Freq: Three times a day (TID) | ORAL | Status: DC
  Administered 2018-03-11 – 2018-03-13 (×9): 600 mg via ORAL
  Filled 2018-03-11 (×9): qty 1

## 2018-03-11 MED ORDER — ALBUTEROL SULFATE (2.5 MG/3ML) 0.083% IN NEBU
2.5000 mg | INHALATION_SOLUTION | RESPIRATORY_TRACT | Status: DC | PRN
  Administered 2018-03-12: 2.5 mg via RESPIRATORY_TRACT
  Filled 2018-03-11: qty 3

## 2018-03-11 MED ORDER — MAGNESIUM OXIDE 400 (241.3 MG) MG PO TABS
400.0000 mg | ORAL_TABLET | Freq: Every day | ORAL | Status: DC
  Administered 2018-03-11 – 2018-03-13 (×3): 400 mg via ORAL
  Filled 2018-03-11 (×3): qty 1

## 2018-03-11 MED ORDER — HYDRALAZINE HCL 20 MG/ML IJ SOLN: 5.0000 mg | INTRAMUSCULAR | Status: DC | PRN

## 2018-03-11 MED ORDER — IPRATROPIUM-ALBUTEROL 0.5-2.5 (3) MG/3ML IN SOLN
3.0000 mL | Freq: Three times a day (TID) | RESPIRATORY_TRACT | Status: DC
  Administered 2018-03-11 – 2018-03-14 (×11): 3 mL via RESPIRATORY_TRACT
  Filled 2018-03-11 (×13): qty 3

## 2018-03-11 MED ORDER — ARFORMOTEROL TARTRATE 15 MCG/2ML IN NEBU
15.0000 ug | INHALATION_SOLUTION | Freq: Two times a day (BID) | RESPIRATORY_TRACT | Status: DC
  Administered 2018-03-11 – 2018-03-14 (×7): 15 ug via RESPIRATORY_TRACT
  Filled 2018-03-11 (×10): qty 2

## 2018-03-11 MED ORDER — FUROSEMIDE 10 MG/ML IJ SOLN
80.0000 mg | Freq: Once | INTRAMUSCULAR | Status: AC
Start: 2018-03-11 — End: 2018-03-11
  Administered 2018-03-11: 80 mg via INTRAVENOUS
  Filled 2018-03-11: qty 8

## 2018-03-11 MED ORDER — SPIRONOLACTONE 25 MG PO TABS
25.0000 mg | ORAL_TABLET | Freq: Two times a day (BID) | ORAL | Status: DC
  Administered 2018-03-11 – 2018-03-14 (×8): 25 mg via ORAL
  Filled 2018-03-11 (×8): qty 1

## 2018-03-11 MED ORDER — MIDODRINE HCL 5 MG PO TABS
5.0000 mg | ORAL_TABLET | Freq: Three times a day (TID) | ORAL | Status: DC
  Administered 2018-03-11 – 2018-03-14 (×12): 5 mg via ORAL
  Filled 2018-03-11 (×12): qty 1

## 2018-03-11 MED ORDER — ALLOPURINOL 100 MG PO TABS
300.0000 mg | ORAL_TABLET | Freq: Every day | ORAL | Status: DC
  Administered 2018-03-11 – 2018-03-13 (×3): 300 mg via ORAL
  Filled 2018-03-11 (×4): qty 3

## 2018-03-11 MED ORDER — ONDANSETRON HCL 4 MG/2ML IJ SOLN
4.0000 mg | Freq: Three times a day (TID) | INTRAMUSCULAR | Status: DC | PRN
  Administered 2018-03-12 – 2018-03-14 (×2): 4 mg via INTRAVENOUS
  Filled 2018-03-11 (×2): qty 2

## 2018-03-11 MED ORDER — ALPRAZOLAM 0.25 MG PO TABS
0.2500 mg | ORAL_TABLET | Freq: Three times a day (TID) | ORAL | Status: DC | PRN
  Administered 2018-03-12 – 2018-03-13 (×2): 0.25 mg via ORAL
  Filled 2018-03-11 (×2): qty 1

## 2018-03-11 MED ORDER — CYCLOBENZAPRINE HCL 10 MG PO TABS
10.0000 mg | ORAL_TABLET | Freq: Three times a day (TID) | ORAL | Status: DC | PRN
  Administered 2018-03-12: 10 mg via ORAL
  Filled 2018-03-11: qty 1

## 2018-03-11 MED ORDER — UMECLIDINIUM BROMIDE 62.5 MCG/INH IN AEPB
1.0000 | INHALATION_SPRAY | Freq: Every day | RESPIRATORY_TRACT | Status: DC
  Administered 2018-03-11 – 2018-03-14 (×4): 1 via RESPIRATORY_TRACT
  Filled 2018-03-11: qty 7

## 2018-03-11 MED ORDER — SODIUM CHLORIDE 0.9 % IV SOLN
1.0000 g | Freq: Every day | INTRAVENOUS | Status: DC
  Administered 2018-03-11 – 2018-03-13 (×4): 1 g via INTRAVENOUS
  Filled 2018-03-11 (×2): qty 10
  Filled 2018-03-11: qty 1
  Filled 2018-03-11 (×2): qty 10

## 2018-03-11 MED ORDER — CITALOPRAM HYDROBROMIDE 40 MG PO TABS
40.0000 mg | ORAL_TABLET | Freq: Every day | ORAL | Status: DC
  Administered 2018-03-11 – 2018-03-13 (×3): 40 mg via ORAL
  Filled 2018-03-11 (×3): qty 1

## 2018-03-11 MED ORDER — VITAMIN D 1000 UNITS PO TABS
5000.0000 [IU] | ORAL_TABLET | Freq: Every day | ORAL | Status: DC
  Administered 2018-03-11 – 2018-03-14 (×4): 5000 [IU] via ORAL
  Filled 2018-03-11 (×4): qty 5

## 2018-03-11 MED ORDER — TRAMADOL HCL 50 MG PO TABS
50.0000 mg | ORAL_TABLET | Freq: Four times a day (QID) | ORAL | Status: DC | PRN
  Administered 2018-03-11 – 2018-03-13 (×2): 50 mg via ORAL
  Filled 2018-03-11 (×2): qty 1

## 2018-03-11 MED ORDER — RIFAMPIN 300 MG PO CAPS
300.0000 mg | ORAL_CAPSULE | Freq: Two times a day (BID) | ORAL | Status: DC
  Administered 2018-03-11 – 2018-03-12 (×4): 300 mg via ORAL
  Filled 2018-03-11 (×5): qty 1

## 2018-03-11 MED ORDER — METOLAZONE 10 MG PO TABS
10.0000 mg | ORAL_TABLET | Freq: Every day | ORAL | Status: DC
  Administered 2018-03-11 – 2018-03-13 (×3): 10 mg via ORAL
  Filled 2018-03-11 (×4): qty 1

## 2018-03-11 MED ORDER — ZOLPIDEM TARTRATE 5 MG PO TABS
5.0000 mg | ORAL_TABLET | Freq: Every evening | ORAL | Status: DC | PRN
  Administered 2018-03-11 – 2018-03-13 (×2): 5 mg via ORAL
  Filled 2018-03-11 (×2): qty 1

## 2018-03-11 MED ORDER — LACTULOSE 10 GM/15ML PO SOLN
20.0000 g | Freq: Every day | ORAL | Status: DC
  Administered 2018-03-11 – 2018-03-13 (×3): 20 g via ORAL
  Filled 2018-03-11 (×4): qty 30

## 2018-03-11 MED ORDER — PANTOPRAZOLE SODIUM 40 MG PO TBEC
40.0000 mg | DELAYED_RELEASE_TABLET | Freq: Every day | ORAL | Status: DC
  Administered 2018-03-11 – 2018-03-13 (×3): 40 mg via ORAL
  Filled 2018-03-11 (×3): qty 1

## 2018-03-11 MED ORDER — FERROUS SULFATE 325 (65 FE) MG PO TABS
325.0000 mg | ORAL_TABLET | Freq: Every day | ORAL | Status: DC
  Administered 2018-03-11 – 2018-03-14 (×4): 325 mg via ORAL
  Filled 2018-03-11 (×4): qty 1

## 2018-03-11 MED ORDER — WHITE PETROLATUM EX OINT
TOPICAL_OINTMENT | CUTANEOUS | Status: AC
  Filled 2018-03-11: qty 28.35

## 2018-03-11 MED ORDER — IPRATROPIUM BROMIDE 0.02 % IN SOLN: 3.0000 mL | Freq: Four times a day (QID) | RESPIRATORY_TRACT | Status: DC

## 2018-03-11 MED ORDER — ASPIRIN EC 81 MG PO TBEC
81.0000 mg | DELAYED_RELEASE_TABLET | Freq: Every day | ORAL | Status: DC
  Administered 2018-03-11 – 2018-03-13 (×4): 81 mg via ORAL
  Filled 2018-03-11 (×4): qty 1

## 2018-03-11 MED ORDER — SALINE SPRAY 0.65 % NA SOLN
1.0000 | NASAL | Status: DC | PRN
  Filled 2018-03-11: qty 44

## 2018-03-11 MED ORDER — ALBUTEROL SULFATE HFA 108 (90 BASE) MCG/ACT IN AERS: 2.0000 | INHALATION_SPRAY | Freq: Four times a day (QID) | RESPIRATORY_TRACT | Status: DC | PRN

## 2018-03-11 NOTE — Telephone Encounter (Signed)
[name] I noted she has been admitted to the hospital where she will have these issues addressed. I think she needs palliative care consult while admitted to discuss long term goals at this point. She can follow up with me after hospitalization if she is not otherwise in hospice care

## 2018-03-11 NOTE — Progress Notes (Signed)
Pt started on new MDI today ([place]). Educated pt and her husband who gives her all of her medications. No questions from either. Both stated they understood. RT will continue to monitor.

## 2018-03-11 NOTE — Progress Notes (Signed)
PROGRESS NOTE    [name]  JAS:[id number] DOB: Jan 07, 1946 DOA: 03/10/2018 PCP: Unk Pinto, MD  Outpatient Specialists:     Brief Narrative:  72 yo female with a PMH significant for cirrhosis 2/2 NASH, ascites, esophageal varices, HTN, hyperlipidemia, DM, COPD, asthma, GERD, gout, depression w/ anxiety, hepatic encephalopathy, iron deficiency anemia, CKD-3, and chronic diastolic CHF is here because of refractory ascites. The patient received paracentesis Friday 03/07/2018, during which 2.5 L was removed. She noted an 8 lb weight loss following the procedures; however, she has regained the 8 lbs. She endorses severe lower abdominal pain, sharp in nature, due to the swelling of her abdomen. The patient was admitted for paracentesis and rule out SBP. Pertinent labs include the following: lipase 69, BMP - Glu 105 Cr 1.44 Ca 8.0, Ammonia 84, CBC - RBC 2.28 Hgb 7.6 Hct 24.9 MCV 109.2 RDW 17.3 Platelets 110, PTT 27 wnl, PT 14.8 wnl, INR 1.17 wnl, UA pending, Blood cultures pending. CXR showed mild bilateral basilar opacity. GI is following, recommends palliative care consult and will see patient for follow up outpatient to discuss permanent drain vs scheduled paracentesis.   Assessment & Plan:   Principal Problem:   Abdominal pain Active Problems:   Essential hypertension   GERD   Thrombocytopenia (HCC)   COPD GOLD IV/ 02 dep    Anasarca   Ascites   Liver cirrhosis secondary to nonalcoholic steatohepatitis (NASH) (HCC)   Chronic diastolic CHF (congestive heart failure) (HCC)   CKD (chronic kidney disease), stage III (HCC)   Iron deficiency anemia due to chronic blood loss  Cirrhosis 2/2 NASH: -The patient is A&O x3 -Ammonia 84 -Continue home lactulose 20 g daily  Abdominal pain & distention 2/2 ascites: -Telemetry -IV Rocephin for SBP prophylaxis -Paracentesis scheduled, will obtain cultures -GI following: will see patient in clinic to discuss possible permanent drainage vs  scheduled paracentesis due to rapid refractory ascites. Recommends palliative care consult. -Continue morphine 1 mg Q3H prn for pain managment  Iron Deficiency Anemia:  -CBC - RBC 2.28, Hgb 7.6, Hct 24.9, MCV 109.2, RDW 17.3, platelets 110 -Continue ferrous sulfate 325 mg -Monitor CBCs  Chronic Diastolic Congestive Heart Failure w/o acute respiratory distress: -No signs of acute respiratory distress -The patient reports breathing better today -Currently 98% on 3L via [place] -Echo from Feb 2019 showed grade 2 diastolic dysfunction  -Continue Metolazone 10 mg daily  CKD-III: -Cr 1.44 (yest 1.45), BUN 21 (yest 21) -IVF  COPD w/o acute exacerbation: -No signs of acute exacerbation -Patient endorses her SOB has improved since yesterday  -Currently 98% on 3L via  -Continue duoneb 3 ml TID -Continue albuterol 2.5 mg Q4H -Continue arformoterol 15 mcg BID  GERD: -Patient denies dysphagia, odynophagia, burning abdominal pain, N/V -Continue Protonix  Essential Hypertension: -BP well-controlled at this time -Continue Hydralazine 5 mg daily prn   DVT prophylaxis: SCDs Code Status: Full Family Communication: No family present at bedside during interview Disposition Plan: The patient will receive paracentesis and fluid will be tested for SBP workup.   Consultants:   Gastroenterology  Procedures:   Paracentesis  Antimicrobials:   Rocephin   Subjective: The patient was lying supine in hospital bed alert and oriented to person, place, and time, in no acute distress. She reports her abdominal pain is better today than yesterday. However, she reports she feels all the fluid that was drained last Friday is back.   Objective: Vitals:   03/11/18 4193 03/11/18 0413 03/11/18 0801 03/11/18 7902  BP: (!) 147/46 (!) 155/56 (!) 126/46   Pulse: 91 (!) 106 (!) 105   Resp: (!) 22 (!) 22    Temp: 98.3 F (36.8 C) 98.5 F (36.9 C) 98.8 F (37.1 C)   TempSrc: Oral Oral Oral   SpO2:  96% 97% 96% 98%  Weight: 90.7 kg (199 lb 15.3 oz)     Height: 5' 2"  (1.575 m)       Intake/Output Summary (Last 24 hours) at 03/11/2018 1103 Last data filed at 03/11/2018 1023 Gross per 24 hour  Intake 100 ml  Output 500 ml  Net -400 ml   Filed Weights   03/10/18 1757 03/11/18 0146  Weight: 88.9 kg (196 lb) 90.7 kg (199 lb 15.3 oz)    Examination:  General exam: Appears calm and comfortable  Respiratory system: Clear to auscultation. Respiratory effort normal. Cardiovascular system: S1 & S2 heard, RRR. No JVD, murmurs, rubs, gallops or clicks. No pedal edema. Gastrointestinal system: Diffuse distension and TTP to all quadrants. No organomegaly or masses felt. Normal bowel sounds heard. Central nervous system: Alert and oriented. No focal neurological deficits. Extremities: Symmetric 5 x 5 power. Skin: No rashes, lesions or ulcers Psychiatry: Judgement and insight appear normal. Mood & affect appropriate.     Data Reviewed: I have personally reviewed following labs and imaging studies  CBC: Recent Labs  Lab 03/07/18 1815 03/10/18 1803 03/11/18 0355  WBC 5.3 5.1 5.6  HGB 8.2* 8.3* 7.6*  HCT 27.8* 27.9* 24.9*  MCV 112.1* 111.6* 109.2*  PLT 98* 115* 101*   Basic Metabolic Panel: Recent Labs  Lab 03/07/18 1815 03/10/18 1803 03/11/18 0355  NA 138 137 138  K 5.0 4.7 4.8  CL 101 101 102  CO2 28 28 25   GLUCOSE 121* 120* 105*  BUN 29* 21 21  CREATININE 1.46* 1.45* 1.44*  CALCIUM 8.6* 7.9* 8.0*   GFR: Estimated Creatinine Clearance: 37.5 mL/min (A) (by C-G formula based on SCr of 1.44 mg/dL (H)). Liver Function Tests: Recent Labs  Lab 03/10/18 1803  AST 23  ALT 19  ALKPHOS 116  BILITOT 1.5*  PROT 4.9*  ALBUMIN 2.6*   Recent Labs  Lab 03/10/18 1803  LIPASE 69*   Recent Labs  Lab 03/11/18 0355  AMMONIA 84*   Coagulation Profile: Recent Labs  Lab 03/11/18 0355  INR 1.17   Cardiac Enzymes: No results for input(s): CKTOTAL, CKMB, CKMBINDEX,  TROPONINI in the last 168 hours. BNP (last 3 results) No results for input(s): PROBNP in the last 8760 hours. HbA1C: No results for input(s): HGBA1C in the last 72 hours. CBG: No results for input(s): GLUCAP in the last 168 hours. Lipid Profile: No results for input(s): CHOL, HDL, LDLCALC, TRIG, CHOLHDL, LDLDIRECT in the last 72 hours. Thyroid Function Tests: No results for input(s): TSH, T4TOTAL, FREET4, T3FREE, THYROIDAB in the last 72 hours. Anemia Panel: No results for input(s): VITAMINB12, FOLATE, FERRITIN, TIBC, IRON, RETICCTPCT in the last 72 hours. Urine analysis:    Component Value Date/Time   COLORURINE YELLOW 02/25/2018 1706   APPEARANCEUR CLEAR 02/25/2018 1706   LABSPEC 1.008 02/25/2018 1706   PHURINE 6.0 02/25/2018 1706   GLUCOSEU NEGATIVE 02/25/2018 1706   HGBUR NEGATIVE 02/25/2018 1706   BILIRUBINUR NEGATIVE 02/25/2018 1706   KETONESUR NEGATIVE 02/25/2018 1706   PROTEINUR NEGATIVE 02/25/2018 1706   UROBILINOGEN 1 01/13/2015 1145   NITRITE NEGATIVE 02/25/2018 1706   LEUKOCYTESUR TRACE (A) 02/25/2018 1706   Sepsis Labs: @LABRCNTIP (procalcitonin:4,lacticidven:4)  )No results found for this or  any previous visit (from the past 240 hour(s)).       Radiology Studies: Dg Chest 2 View  Result Date: 03/10/2018 CLINICAL DATA:  Shortness of breath.  Abdominal pain/distension. EXAM: CHEST - 2 VIEW COMPARISON:  March 07, 2018 FINDINGS: There is a tiny calcified granuloma in the right upper lung. The heart, hila, and mediastinum are normal. No pneumothorax. Mild opacity in the bases, right greater than left is similar. No overt edema. Right-sided rib fractures again noted. IMPRESSION: 1. Mild bibasilar opacities, similar to mildly improved in the interval may represent atelectasis. Subtle infiltrate is considered less likely. No overt edema. Electronically Signed   By: Dorise Bullion III M.D   On: 03/10/2018 18:55        Scheduled Meds: . allopurinol  300 mg Oral  Daily  . arformoterol  15 mcg Nebulization BID  . aspirin EC  81 mg Oral QHS  . cholecalciferol  5,000 Units Oral Q breakfast  . citalopram  40 mg Oral Daily  . ferrous sulfate  325 mg Oral Q breakfast  . gabapentin  600 mg Oral TID  . ipratropium-albuterol  3 mL Nebulization TID  . lactulose  20 g Oral Daily  . magnesium oxide  400 mg Oral QHS  . metolazone  10 mg Oral Daily  . midodrine  5 mg Oral TID WC  . pantoprazole  40 mg Oral Daily  . rifampin  300 mg Oral BID  . spironolactone  25 mg Oral BID  . umeclidinium bromide  1 puff Inhalation Daily   Continuous Infusions: . cefTRIAXone (ROCEPHIN)  IV 1 g (03/11/18 0420)     LOS: 0 days    Marney Setting, PA-S Riccardo Dubin Arrien MD [facility] Pager [phone]  If 7PM-7AM, please contact night-coverage [email or web address] Password [email or web address] 03/11/2018, 11:03 AM

## 2018-03-11 NOTE — ED Provider Notes (Signed)
[facility] EMERGENCY DEPARTMENT Provider Note   CSN: [id number] Arrival date & time: 03/10/18  1741     History   Chief Complaint Chief Complaint  Patient presents with  . Shortness of Breath  . Abdominal Pain    HPI [name] is a 72 y.o. female.  HPI  This is a 72 year old female with a history of nonalcoholic cirrhosis, COPD, diabetes who presents with shortness of breath and abdominal pain and distention.  She was seen and evaluated on Friday for the same.  At that time she had a therapeutic paracentesis while in the emergency room with 2.5 L of fluid withdrawn.  Husband states that since that time she has regained 8 to 10 pounds over the last 2 days.  He reports that this is much quicker than normal.  She at baseline wears home oxygen.  She states that her abdomen feels very tight and she is very sore across the lower abdomen.  She also reports shortness of breath.  No fevers or cough.  She called her GI doctor today but cannot get into see him until Thursday.  There was some discussion about placing a permanent drain for frequent fluid removal.  She reports decreased urine output today.  She has recently had some adjustment in her diuretic medications.  She denies any chest pain.  I reviewed her chart.  She was recently seen in the ED and recently had a hospital admission and at the end of July.  She and her husband have declined hospice care.  She does have a nurse come out several times a week.  A therapeutic paracentesis was performed in the ED as the patient declined admission although this is what was recommended.  Past Medical History:  Diagnosis Date  . Asthma   . Cancer [facility])    liver cancer   . COPD (chronic obstructive pulmonary disease) ([place])    x 3 years   . Depression   . Diabetes mellitus without complication ([place])    diet controlled  not on meds   . Edema    left leg   . Esophageal varices ([place])   . Fibromyalgia   . Gastritis   .  GERD (gastroesophageal reflux disease)   . Hepatic cirrhosis ([place])   . Hepatic encephalopathy ([place])   . History of blood transfusion   . Hyperlipidemia   . Hypertension   . IBS (irritable bowel syndrome)   . Phlebitis    left leg x 2   . Pneumonia    hx of   . Splenomegaly   . Vitamin D deficiency     Patient Active Problem List   Diagnosis Date Noted  . Pressure injury of skin 02/27/2018  . Iron deficiency anemia due to chronic blood loss   . Occult blood in stools   . Acute blood loss anemia 01/19/2018  . Chronic respiratory failure with hypoxia and hypercapnia ([place]) 12/26/2017  . Obesity hypoventilation syndrome ([place])   . Recurrent right pleural effusion   . NASH (nonalcoholic steatohepatitis)   . Liver cirrhosis secondary to nonalcoholic steatohepatitis (NASH) ([place])   . Chronic diastolic CHF (congestive heart failure) ([place])   . Macrocytic anemia   . CKD (chronic kidney disease), stage III ([place])   . Ascites 09/07/2017  . Hepatocellular carcinoma ([place]) 08/30/2017  . Anasarca 12/14/2016  . Obesity (BMI 30-39.9) 10/12/2016  . Hyperammonemia ([place]) 10/12/2016  . Atrial tachycardia, multifocal ([place])   . Chronic respiratory failure ([place]) 01/19/2016  .  Hepatic encephalopathy ([place]) 09/21/2015  . Hypokalemia 09/21/2015  . COPD GOLD IV/ 02 dep  08/30/2015  . Thrombocytopenia ([place]) 08/07/2015  . Venous insufficiency 07/26/2015  . Goals of care, counseling/discussion 12/14/2013  . Essential hypertension   . Hyperlipidemia   . GERD   . Vitamin D deficiency   . IBS   . Fibromyalgia   . Esophageal varices ([place]) 07/08/2012  . COLONIC POLYPS 11/11/2008  . Symptomatic anemia 05/28/2008  . Esophagitis 05/27/2008  . Diverticulosis of large intestine 05/27/2008    Past Surgical History:  Procedure Laterality Date  . ABDOMINAL HYSTERECTOMY    . BACK SURGERY    . CATARACT EXTRACTION, BILATERAL    . CHOLECYSTECTOMY    . ESOPHAGOGASTRODUODENOSCOPY  07/16/2012   Procedure:  ESOPHAGOGASTRODUODENOSCOPY (EGD);  Surgeon: Inda Castle, MD;  Location: [facility];  Service: Endoscopy;  Laterality: N/A;  . ESOPHAGOGASTRODUODENOSCOPY N/A 01/20/2018   Procedure: ESOPHAGOGASTRODUODENOSCOPY (EGD);  Surgeon: Ladene Artist, MD;  Location: [facility] ENDOSCOPY;  Service: Endoscopy;  Laterality: N/A;  . GASTRIC VARICES BANDING  07/16/2012   Procedure: GASTRIC VARICES BANDING;  Surgeon: Inda Castle, MD;  Location: [facility] ENDOSCOPY;  Service: Endoscopy;  Laterality: N/A;  . HIP ARTHROPLASTY Bilateral   . IR GENERIC HISTORICAL  06/25/2016   IR RADIOLOGIST EVAL & MGMT 06/25/2016 [facility]INTER[facility]  . IR GENERIC HISTORICAL  06/27/2016   IR KYPHO LUMBAR INC FX REDUCE BONE BX UNI/BIL CANNULATION INC/IMAGING 06/27/2016 Luanne Bras, MD [facility]  . IR GENERIC HISTORICAL  07/17/2016   IR RADIOLOGIST EVAL & MGMT 07/17/2016 [facility]  . IR PARACENTESIS  01/20/2018  . IR PARACENTESIS  02/27/2018  . IR RADIOLOGIST EVAL & MGMT  07/10/2017  . IR RADIOLOGIST EVAL & MGMT  10/01/2017  . IR RADIOLOGIST EVAL & MGMT  12/11/2017  . IR THORACENTESIS ASP PLEURAL SPACE W/IMG GUIDE  09/12/2017  . IR THORACENTESIS ASP PLEURAL SPACE W/IMG GUIDE  09/17/2017  . NECK SURGERY    . RADIOFREQUENCY ABLATION N/A 08/30/2017   Procedure: CT MICROWAVE THERMAL ABLATION;  Surgeon: Arne Cleveland, MD;  Location: [facility] ORS;  Service: Anesthesiology;  Laterality: N/A;  . SHOULDER SURGERY Right      OB History   None      Home Medications    Prior to Admission medications   Medication Sig Start Date End Date Taking? Authorizing Provider  albuterol (PROVENTIL HFA;VENTOLIN HFA) 108 (90 Base) MCG/ACT inhaler Inhale 2 puffs into the lungs every 6 (six) hours as needed for wheezing or shortness of breath. 05/27/16  Yes Ward, Delice Bison, DO  allopurinol (ZYLOPRIM) 300 MG tablet take 1 tablet (300 mg) by mouth daily with supper - for gout 03/04/18  Yes Unk Pinto, MD  ALPRAZolam Duanne Moron) 0.25 MG tablet Take 1 tab as  needed for anxiety, cramping, air hunger every 8 hours as needed. 11/01/17  Yes Liane Comber, NP  aspirin EC 81 MG tablet Take 81 mg by mouth at bedtime.   Yes [provider]  Cholecalciferol (VITAMIN D3) 5000 units CAPS Take 5,000 Units by mouth daily with breakfast.   Yes [provider]  citalopram (CELEXA) 40 MG tablet Take 1 tablet (40 mg total) by mouth daily. 03/01/18  Yes Florencia Reasons, MD  cyclobenzaprine (FLEXERIL) 10 MG tablet Take 1 tablet (10 mg total) by mouth 3 (three) times daily as needed for muscle spasms. 03/06/16  Yes Unk Pinto, MD  ferrous sulfate 325 (65 FE) MG tablet Take 1 tablet (325 mg total) by mouth daily  with breakfast. 03/01/18  Yes Florencia Reasons, MD  gabapentin (NEURONTIN) 600 MG tablet Take 600 mg by mouth 3 (three) times daily.    Yes [provider]  Glycopyrrolate-Formoterol (BEVESPI AEROSPHERE) 9-4.8 MCG/ACT AERO Inhale 2 puffs into the lungs 2 (two) times daily. 12/25/17  Yes Tanda Rockers, MD  ipratropium (ATROVENT) 0.02 % nebulizer solution Take 3 mLs by nebulization every 4 (four) hours as needed for wheezing or shortness of breath. 01/29/18  Yes [provider]  lactulose (CHRONULAC) 10 GM/15ML solution take 20ms TWICE DAILY Patient taking differently: Take 20 g by mouth daily.  08/08/17  Yes MUnk Pinto MD  Magnesium 250 MG TABS Take 250 mg by mouth at bedtime.    Yes [provider]  metolazone (ZAROXOLYN) 5 MG tablet Take 1 tablet daily or as directed for fluid Overload Patient taking differently: Take 10 mg by mouth daily.  02/20/18  Yes MUnk Pinto MD  midodrine (PROAMATINE) 5 MG tablet Take 1 tablet (5 mg total) by mouth 3 (three) times daily with meals. 03/01/18  Yes XFlorencia Reasons MD  ondansetron (ZOFRAN) 4 MG tablet Take 1 tablet (4 mg total) by mouth every 6 (six) hours as needed for nausea or vomiting. 09/02/17  Yes CVicie Mutters PA-C  OXYGEN Inhale 2-3 L into the lungs continuous. 3 L with exertion    Yes [provider]  pantoprazole (PROTONIX) 40 MG tablet Take 1 tablet (40 mg total) by mouth daily. 03/04/18  Yes MUnk Pinto MD  potassium chloride (K-DUR) 10 MEQ tablet Take 1 tablet (10 mEq total) by mouth daily. TAKE 2 TABLETS BY MOUTH 3 TIMES A DAY. Patient taking differently: Take 20 mEq by mouth 3 (three) times daily.  03/01/18  Yes XFlorencia Reasons MD  rifampin (RIFADIN) 300 MG capsule Take 1 capsule by mouth 2 (two) times daily.   Yes [provider]  spironolactone (ALDACTONE) 25 MG tablet Take 1 tablet (25 mg total) by mouth 2 (two) times daily. 03/01/18  Yes XFlorencia Reasons MD  traMADol (Veatrice Bourbon 50 MG tablet Take 50 mg by mouth every 12 hours as needed for pain for chronic pain 03/03/18  Yes CLiane Comber NP  vitamin C (ASCORBIC ACID) 500 MG tablet Take 500 mg by mouth 3 (three) times daily.   Yes [provider]    Family History Family History  Problem Relation Age of Onset  . Diabetes Brother        deceased  . Heart disease Sister        A Fib  . Heart disease Mother        CHF  . Atrial fibrillation Sister   . Cancer Father        lung cancer   . Cancer Maternal Uncle        unknown type cancer   . Cancer Other        lung cancer   . Colon cancer Neg Hx     Social History Social History   Tobacco Use  . Smoking status: Former Smoker    Packs/day: 1.00    Years: 50.00    Pack years: 50.00    Types: Cigarettes    Last attempt to quit: 11/05/2014    Years since quitting: 3.3  . Smokeless tobacco: Never Used  . Tobacco comment: Quit April 2016  Substance Use Topics  . Alcohol use: No    Alcohol/week: 0.0 oz  . Drug use: No     Allergies   Atorvastatin;  Diphenhydramine hcl (sleep); Hydrocodone-acetaminophen; Lopid [gemfibrozil]; Loratadine; Lorazepam; Simvastatin; Sulfamethoxazole; and Sulfonamide derivatives   Review of Systems Review of Systems  Constitutional: Negative for fever.  Respiratory: Positive for shortness of breath.  Negative for cough.   Cardiovascular: Negative for chest pain.  Gastrointestinal: Positive for abdominal distention and abdominal pain. Negative for diarrhea, nausea and vomiting.  Genitourinary: Negative for dysuria.       Decreased urination  Skin: Positive for color change. Negative for rash.       Multiple bruises  All other systems reviewed and are negative.    Physical Exam Updated Vital Signs BP (!) 159/52 (BP Location: Left Arm)   Pulse 87   Temp 98.1 F (36.7 C) (Oral)   Resp 16   Wt 88.9 kg (196 lb)   SpO2 100%   BMI 35.85 kg/m   Physical Exam  Constitutional: She is oriented to person, place, and time.  Elderly, chronically ill-appearing  HENT:  Head: Normocephalic and atraumatic.  Eyes: Pupils are equal, round, and reactive to light.  Cardiovascular: Normal rate, regular rhythm and normal heart sounds.  Pulmonary/Chest: Effort normal. No respiratory distress. She has no wheezes.  Nasal cannula in place, mild tachypnea, diminished breath sounds in all lung fields  Abdominal: Soft. Bowel sounds are normal. She exhibits distension and ascites. She exhibits no mass. There is tenderness.  Diffuse tenderness to palpation, positive fluid wave  Musculoskeletal:       Right lower leg: She exhibits edema.       Left lower leg: She exhibits edema.  Anasarca to the abdomen  Neurological: She is alert and oriented to person, place, and time.  Skin: Skin is warm and dry.  Multiple bruises bilateral forearms  Psychiatric: She has a normal mood and affect.  Nursing note and vitals reviewed.    ED Treatments / Results  Labs (all labs ordered are listed, but only abnormal results are displayed) Labs Reviewed  LIPASE, BLOOD - Abnormal; Notable for the following components:      Result Value   Lipase 69 (*)    All other components within normal limits  COMPREHENSIVE METABOLIC PANEL - Abnormal; Notable for the following components:   Glucose, Bld 120 (*)    Creatinine,  Ser 1.45 (*)    Calcium 7.9 (*)    Total Protein 4.9 (*)    Albumin 2.6 (*)    Total Bilirubin 1.5 (*)    GFR calc non Af Amer 35 (*)    GFR calc Af Amer 41 (*)    All other components within normal limits  CBC - Abnormal; Notable for the following components:   RBC 2.50 (*)    Hemoglobin 8.3 (*)    HCT 27.9 (*)    MCV 111.6 (*)    MCHC 29.7 (*)    RDW 17.5 (*)    Platelets 115 (*)    All other components within normal limits  URINALYSIS, ROUTINE W REFLEX MICROSCOPIC  I-STAT TROPONIN, ED    EKG EKG Interpretation  Date/Time:  Monday March 10 2018 17:57:06 EDT Ventricular Rate:  84 PR Interval:  134 QRS Duration: 112 QT Interval:  404 QTC Calculation: 477 R Axis:   41 Text Interpretation:  Normal sinus rhythm Indeterminate axis Right bundle branch block Abnormal ECG No significant change since last tracing Confirmed by Thayer Jew [phone]) on 03/10/2018 11:49:14 PM   Radiology Dg Chest 2 View  Result Date: 03/10/2018 CLINICAL DATA:  Shortness of breath.  Abdominal pain/distension. EXAM:  CHEST - 2 VIEW COMPARISON:  March 07, 2018 FINDINGS: There is a tiny calcified granuloma in the right upper lung. The heart, hila, and mediastinum are normal. No pneumothorax. Mild opacity in the bases, right greater than left is similar. No overt edema. Right-sided rib fractures again noted. IMPRESSION: 1. Mild bibasilar opacities, similar to mildly improved in the interval may represent atelectasis. Subtle infiltrate is considered less likely. No overt edema. Electronically Signed   By: Dorise Bullion III M.D   On: 03/10/2018 18:55    Procedures Procedures (including critical care time)  Medications Ordered in ED Medications  furosemide (LASIX) injection 80 mg (has no administration in time range)     Initial Impression / Assessment and Plan / ED Course  I have reviewed the triage vital signs and the nursing notes.  Pertinent labs & imaging results that were available during my  care of the patient were reviewed by me and considered in my medical decision making (see chart for details).     Patient presents with shortness of breath and abdominal distention.  Recent presentation for the same.  She reports reaccumulation of 8 to 10 pounds over 2 to 3 days.  She appears grossly volume overloaded.  Chest x-ray does not show any pulmonary edema or pleural effusion.  Suspect shortness of breath is related to her abdominal distention.  She denies any fevers.  Doubt SBP and suspect her symptoms are related to distention and volume overload.  I discussed with the patient that I felt that she would benefit from admission for diuretics and paracentesis.  She may also benefit from a permanent drain.  This cannot be done in the ED.  Given her medical comorbidities, this is not something that should not be done routinely in the ED.  She and her husband are in agreement.  We will plan for admission to the hospitalist.  She was given 80 mg of IV Lasix.  Final Clinical Impressions(s) / ED Diagnoses   Final diagnoses:  Ascites of liver  Other hypervolemia  Shortness of breath    ED Discharge Orders    None       Merryl Hacker, MD 03/11/18 [phone]

## 2018-03-11 NOTE — Telephone Encounter (Signed)
Thanks Almyra Free. I saw the note in her chart you recently added. I have not seen them in a few months.  If they need a discussion about DNR status, while I am happy to do this with them they may wish to see their PCP as well. I think a referral to palliative care is also appropriate, I'm not sure if they have had this yet but we can offer this as an outpatient to discuss it with them. I can try and add her to our schedule on Thursday 8/8 at 1130 if you want to add her there. I note she did not increase diuretics as I had previously recommended, and they should do this. If she is having quick recurrence of ascites, we can have scheduled paracentesis if that would help her, I will discuss this with her in clinic.

## 2018-03-11 NOTE — Progress Notes (Signed)
PROGRESS NOTE    [name]  BEE:[id number] DOB: [birth year] DOA: 03/10/2018 PCP: Unk Pinto, MD    Brief Narrative:  72 year old female who presented with abdominal distention and abdominal pain, she does have significant past medical history of cirrhosis due to [facility], complicated by recurrent ascites and esophageal varices.  She also has hypertension, dyslipidemia, type 2 diabetes mellitus, COPD, GERD, depression and anxiety.  Chronic kidney disease stage III and diastolic heart failure.  Had a recent paracentesis 08/02 2.5 L were removed.  Unfortunately she developed recurrent ascites and weight gain/8 pounds over the last 3 days prior to hospitalization.  On the initial physical examination blood pressure 134/49, heart rate 87, respiratory 17, temperature 98.3, oxygen saturation 96%.  Moist mucous membranes, lungs clear to auscultation bilaterally, abdomen distended, tender, no rebound or guarding, positive lower extremity edema 2+ bilaterally.  Patient was admitted to the hospital with the working diagnosis of recurrent ascites to rule out SBP.  Assessment & Plan:   Principal Problem:   Abdominal pain Active Problems:   Essential hypertension   GERD   Thrombocytopenia (HCC)   COPD GOLD IV/ 02 dep    Anasarca   Ascites   Liver cirrhosis secondary to nonalcoholic steatohepatitis (NASH) (HCC)   Chronic diastolic CHF (congestive heart failure) (HCC)   CKD (chronic kidney disease), stage III (HCC)   Iron deficiency anemia due to chronic blood loss   1. Advanced liver disease due to NASH. Patient had paracentesis this am, (1,8 liters removed) will follow on fluid analysis, gram stain and cultures, will continue antibiotic therapy for now. If infection is ruled out will consider palliative permanent peritoneal small drain catheter. Continue neuro checks, no signs of encephalopathy. Patient had furosemide on admission. Will continue lactulose and aldactone.   2.Hypotension.  Systolic blood pressure 588 mmHg, will continue with midodrine per home regimen. Positive hypervolemic.   3. CKD stage 3. Renal function with stable cr at 1,45 with K at 4,8 and serum bicarbonate at 25. Will wait 24 hours before resuming diuresis, follow on renal panel in am, avoid hypotension or nephrotoxic medications.   4. COPD. Stable with no signs of acute exacerbation , will continue bronchodilators.   5. Diastolic heart failure acute on chronic. Patient is sp parecentesis, will resume diuresis in am.   6. Chronic multifactorial anemia with thrombocytopenia. Hb at 7,4 with  hct at 24,9 no signs of acute bleeding, will continue follow cell count.   DVT prophylaxis: scd   Code Status: partial no intubation  Family Communication: I spoke with patient's husband at the bedside and all questions were addressed.  Disposition Plan/ discharge barriers: Pending clinical improvement    Consultants:     Procedures:     Antimicrobials:   Ceftriaxone IV     Subjective: Patient is feeling better after paracentesis but not back to baseline, continue to have abdominal pain, no nausea or vomiting, dyspnea has improved.   Objective: Vitals:   03/11/18 0801 03/11/18 0812 03/11/18 1239 03/11/18 1336  BP: (!) 126/46  (!) 126/42   Pulse: (!) 105  99   Resp:      Temp: 98.8 F (37.1 C)     TempSrc: Oral     SpO2: 96% 98% 99% 98%  Weight:      Height:        Intake/Output Summary (Last 24 hours) at 03/11/2018 1648 Last data filed at 03/11/2018 1023 Gross per 24 hour  Intake 100 ml  Output 500 ml  Net -400 ml   Filed Weights   03/10/18 1757 03/11/18 0146  Weight: 88.9 kg (196 lb) 90.7 kg (199 lb 15.3 oz)    Examination:   General: Not in pain or dyspnea, deconditioned  Neurology: Awake and alert, non focal  E ENT: mild pallor, no icterus, oral mucosa moist Cardiovascular: No JVD. S1-S2 present, rhythmic, no gallops, rubs, or murmurs. No lower extremity edema. Pulmonary:  decrased breath sounds bilaterally at bases, adequate air movement, no wheezing, rhonchi or rales. Gastrointestinal. Abdomen with no organomegaly, non tender, no rebound or guarding Skin. No rashes Musculoskeletal: no joint deformities     Data Reviewed: I have personally reviewed following labs and imaging studies  CBC: Recent Labs  Lab 03/07/18 1815 03/10/18 1803 03/11/18 0355  WBC 5.3 5.1 5.6  HGB 8.2* 8.3* 7.6*  HCT 27.8* 27.9* 24.9*  MCV 112.1* 111.6* 109.2*  PLT 98* 115* 440*   Basic Metabolic Panel: Recent Labs  Lab 03/07/18 1815 03/10/18 1803 03/11/18 0355  NA 138 137 138  K 5.0 4.7 4.8  CL 101 101 102  CO2 28 28 25   GLUCOSE 121* 120* 105*  BUN 29* 21 21  CREATININE 1.46* 1.45* 1.44*  CALCIUM 8.6* 7.9* 8.0*   GFR: Estimated Creatinine Clearance: 37.5 mL/min (A) (by C-G formula based on SCr of 1.44 mg/dL (H)). Liver Function Tests: Recent Labs  Lab 03/10/18 1803  AST 23  ALT 19  ALKPHOS 116  BILITOT 1.5*  PROT 4.9*  ALBUMIN 2.6*   Recent Labs  Lab 03/10/18 1803  LIPASE 69*   Recent Labs  Lab 03/11/18 0355  AMMONIA 84*   Coagulation Profile: Recent Labs  Lab 03/11/18 0355  INR 1.17   Cardiac Enzymes: No results for input(s): CKTOTAL, CKMB, CKMBINDEX, TROPONINI in the last 168 hours. BNP (last 3 results) No results for input(s): PROBNP in the last 8760 hours. HbA1C: No results for input(s): HGBA1C in the last 72 hours. CBG: No results for input(s): GLUCAP in the last 168 hours. Lipid Profile: No results for input(s): CHOL, HDL, LDLCALC, TRIG, CHOLHDL, LDLDIRECT in the last 72 hours. Thyroid Function Tests: No results for input(s): TSH, T4TOTAL, FREET4, T3FREE, THYROIDAB in the last 72 hours. Anemia Panel: No results for input(s): VITAMINB12, FOLATE, FERRITIN, TIBC, IRON, RETICCTPCT in the last 72 hours.    Radiology Studies: I have reviewed all of the imaging during this hospital visit personally     Scheduled Meds: .  allopurinol  300 mg Oral Daily  . arformoterol  15 mcg Nebulization BID  . aspirin EC  81 mg Oral QHS  . cholecalciferol  5,000 Units Oral Q breakfast  . citalopram  40 mg Oral Daily  . ferrous sulfate  325 mg Oral Q breakfast  . gabapentin  600 mg Oral TID  . ipratropium-albuterol  3 mL Nebulization TID  . lactulose  20 g Oral Daily  . lidocaine      . magnesium oxide  400 mg Oral QHS  . metolazone  10 mg Oral Daily  . midodrine  5 mg Oral TID WC  . pantoprazole  40 mg Oral Daily  . rifampin  300 mg Oral BID  . spironolactone  25 mg Oral BID  . umeclidinium bromide  1 puff Inhalation Daily   Continuous Infusions: . cefTRIAXone (ROCEPHIN)  IV 1 g (03/11/18 0420)     LOS: 0 days        Mauricio Gerome Apley, MD [facility] Pager [phone]

## 2018-03-11 NOTE — Procedures (Signed)
PROCEDURE SUMMARY:  Successful image-guided paracentesis from the left abdomen.  Yielded 1.8 liters of cloudy yellow fluid.  No immediate complications.  Patient tolerated well.   Specimen was sent for labs.  Joaquim Nam PA-C 03/11/2018 12:02 PM

## 2018-03-11 NOTE — H&P (Signed)
History and Physical    [name] XVQ:[id number] DOB: 1946-06-11 DOA: 03/10/2018  Referring MD/NP/PA:   PCP: Unk Pinto, MD   Patient coming from:  The patient is coming from home.  At baseline, pt is independent for most of ADL.  Chief Complaint: Abdominal distention, abdominal pain,  HPI: [name] is a 72 y.o. female with medical history significant of liver cirrhosis due to [name], ascites, esophageal varices, hypertension, hyperlipidemia, diabetes mellitus, COPD, asthma, GERD, gout, depression with anxiety, hepatic encephalopathy, iron deficiency anemia, CKD-3, dCHF, who presents with abdominal distention and abdominal pain.  Patient states that she had paracentesis on Friday, and had 2.5 L of fluid removed in the ED, but her abdominal ascites has come back quickly.  She had 8 pounds weight gain. Her abdominal distention has worsened. She also has lower abdominal pain, which is constant, 9 out of 10 severity, sharp, nonradiating.  No nausea, vomiting or diarrhea.  No fever or chills.  Patient does not have chest pain, cough, fever or chills.  She has some mild shortness of breath due to abdominal distention.  No symptoms of UTI or unilateral weakness.  ED Course: pt was found to have WBC 5.1, stable renal function, temperature normal, no tachycardia, no tachypnea, oxygen saturation 100% on room air.  Chest x-ray showed mild bilateral basilar opacity.  Patient is admitted to telemetry bed as inpatient.  Review of Systems:   General: no fevers, chills, has body weight gain, has poor appetite, has fatigue HEENT: no blurry vision, hearing changes or sore throat Respiratory: has dyspnea, no coughing, wheezing CV: no chest pain, no palpitations GI: no nausea, vomiting, has abdominal pain and distension, No diarrhea, constipation GU: no dysuria, burning on urination, increased urinary frequency, hematuria  Ext: has leg edema Neuro: no unilateral weakness, numbness, or  tingling, no vision change or hearing loss Skin: no rash, no skin tear. MSK: No muscle spasm, no deformity, no limitation of range of movement in spin Heme: No easy bruising.  Travel history: No recent long distant travel.  Allergy:  Allergies  Allergen Reactions  . Atorvastatin Other (See Comments)    Unknown reaction  . Diphenhydramine Hcl (Sleep) Hives  . Hydrocodone-Acetaminophen Other (See Comments)    Unknown reaction  . Lopid [Gemfibrozil] Other (See Comments)    Unknown reaction  . Loratadine Hives  . Lorazepam Hives  . Simvastatin Other (See Comments)    Unknown reaction  . Sulfamethoxazole Hives  . Sulfonamide Derivatives Hives    Past Medical History:  Diagnosis Date  . Asthma   . Cancer [facility])    liver cancer   . COPD (chronic obstructive pulmonary disease) ([place])    x 3 years   . Depression   . Diabetes mellitus without complication ([place])    diet controlled  not on meds   . Edema    left leg   . Esophageal varices ([place])   . Fibromyalgia   . Gastritis   . GERD (gastroesophageal reflux disease)   . Hepatic cirrhosis ([place])   . Hepatic encephalopathy ([place])   . History of blood transfusion   . Hyperlipidemia   . Hypertension   . IBS (irritable bowel syndrome)   . Phlebitis    left leg x 2   . Pneumonia    hx of   . Splenomegaly   . Vitamin D deficiency     Past Surgical History:  Procedure Laterality Date  . ABDOMINAL HYSTERECTOMY    . BACK  SURGERY    . CATARACT EXTRACTION, BILATERAL    . CHOLECYSTECTOMY    . ESOPHAGOGASTRODUODENOSCOPY  07/16/2012   Procedure: ESOPHAGOGASTRODUODENOSCOPY (EGD);  Surgeon: Inda Castle, MD;  Location: [facility];  Service: Endoscopy;  Laterality: N/A;  . ESOPHAGOGASTRODUODENOSCOPY N/A 01/20/2018   Procedure: ESOPHAGOGASTRODUODENOSCOPY (EGD);  Surgeon: Ladene Artist, MD;  Location[facility];  Service: Endoscopy;  Laterality: N/A;  . GASTRIC VARICES BANDING  07/16/2012   Procedure: GASTRIC VARICES BANDING;   Surgeon: Inda Castle, MD;  Location: [facility] ENDOSCOPY;  Service: Endoscopy;  Laterality: N/A;  . HIP ARTHROPLASTY Bilateral   . IR GENERIC HISTORICAL  06/25/2016   IR RADIOLOGIST EVAL & MGMT 06/25/2016 [facility]  . IR GENERIC HISTORICAL  06/27/2016   IR KYPHO LUMBAR INC FX REDUCE BONE BX UNI/BIL CANNULATION INC/IMAGING 06/27/2016 Luanne Bras, MD [facility]  . IR GENERIC HISTORICAL  07/17/2016   IR RADIOLOGIST EVAL & MGMT 07/17/2016 [facility]  . IR PARACENTESIS  01/20/2018  . IR PARACENTESIS  02/27/2018  . IR RADIOLOGIST EVAL & MGMT  07/10/2017  . IR RADIOLOGIST EVAL & MGMT  10/01/2017  . IR RADIOLOGIST EVAL & MGMT  12/11/2017  . IR THORACENTESIS ASP PLEURAL SPACE W/IMG GUIDE  09/12/2017  . IR THORACENTESIS ASP PLEURAL SPACE W/IMG GUIDE  09/17/2017  . NECK SURGERY    . RADIOFREQUENCY ABLATION N/A 08/30/2017   Procedure: CT MICROWAVE THERMAL ABLATION;  Surgeon: Arne Cleveland, MD;  Location: [facility] ORS;  Service: Anesthesiology;  Laterality: N/A;  . SHOULDER SURGERY Right     Social History:  reports that she quit smoking about 3 years ago. Her smoking use included cigarettes. She has a 50.00 pack-year smoking history. She has never used smokeless tobacco. She reports that she does not drink alcohol or use drugs.  Family History:  Family History  Problem Relation Age of Onset  . Diabetes Brother        deceased  . Heart disease Sister        A Fib  . Heart disease Mother        CHF  . Atrial fibrillation Sister   . Cancer Father        lung cancer   . Cancer Maternal Uncle        unknown type cancer   . Cancer Other        lung cancer   . Colon cancer Neg Hx      Prior to Admission medications   Medication Sig Start Date End Date Taking? Authorizing Provider  albuterol (PROVENTIL HFA;VENTOLIN HFA) 108 (90 Base) MCG/ACT inhaler Inhale 2 puffs into the lungs every 6 (six) hours as needed for wheezing or shortness of breath. 05/27/16  Yes Ward, Delice Bison, DO  allopurinol  (ZYLOPRIM) 300 MG tablet take 1 tablet (300 mg) by mouth daily with supper - for gout 03/04/18  Yes Unk Pinto, MD  ALPRAZolam Duanne Moron) 0.25 MG tablet Take 1 tab as needed for anxiety, cramping, air hunger every 8 hours as needed. 11/01/17  Yes Liane Comber, NP  aspirin EC 81 MG tablet Take 81 mg by mouth at bedtime.   Yes [provider]  Cholecalciferol (VITAMIN D3) 5000 units CAPS Take 5,000 Units by mouth daily with breakfast.   Yes [provider]  citalopram (CELEXA) 40 MG tablet Take 1 tablet (40 mg total) by mouth daily. 03/01/18  Yes Florencia Reasons, MD  cyclobenzaprine (FLEXERIL) 10 MG tablet Take 1 tablet (10 mg total) by mouth 3 (three)  times daily as needed for muscle spasms. 03/06/16  Yes Unk Pinto, MD  ferrous sulfate 325 (65 FE) MG tablet Take 1 tablet (325 mg total) by mouth daily with breakfast. 03/01/18  Yes Florencia Reasons, MD  gabapentin (NEURONTIN) 600 MG tablet Take 600 mg by mouth 3 (three) times daily.    Yes [provider]  Glycopyrrolate-Formoterol (BEVESPI AEROSPHERE) 9-4.8 MCG/ACT AERO Inhale 2 puffs into the lungs 2 (two) times daily. 12/25/17  Yes Tanda Rockers, MD  ipratropium (ATROVENT) 0.02 % nebulizer solution Take 3 mLs by nebulization every 4 (four) hours as needed for wheezing or shortness of breath. 01/29/18  Yes [provider]  lactulose (CHRONULAC) 10 GM/15ML solution take 54ms TWICE DAILY Patient taking differently: Take 20 g by mouth daily.  08/08/17  Yes MUnk Pinto MD  Magnesium 250 MG TABS Take 250 mg by mouth at bedtime.    Yes [provider]  metolazone (ZAROXOLYN) 5 MG tablet Take 1 tablet daily or as directed for fluid Overload Patient taking differently: Take 10 mg by mouth daily.  02/20/18  Yes MUnk Pinto MD  midodrine (PROAMATINE) 5 MG tablet Take 1 tablet (5 mg total) by mouth 3 (three) times daily with meals. 03/01/18  Yes XFlorencia Reasons MD  ondansetron (ZOFRAN) 4 MG tablet Take 1 tablet (4 mg  total) by mouth every 6 (six) hours as needed for nausea or vomiting. 09/02/17  Yes CVicie Mutters PA-C  OXYGEN Inhale 2-3 L into the lungs continuous. 3 L with exertion   Yes [provider]  pantoprazole (PROTONIX) 40 MG tablet Take 1 tablet (40 mg total) by mouth daily. 03/04/18  Yes MUnk Pinto MD  potassium chloride (K-DUR) 10 MEQ tablet Take 1 tablet (10 mEq total) by mouth daily. TAKE 2 TABLETS BY MOUTH 3 TIMES A DAY. Patient taking differently: Take 20 mEq by mouth 3 (three) times daily.  03/01/18  Yes XFlorencia Reasons MD  rifampin (RIFADIN) 300 MG capsule Take 1 capsule by mouth 2 (two) times daily.   Yes [provider]  spironolactone (ALDACTONE) 25 MG tablet Take 1 tablet (25 mg total) by mouth 2 (two) times daily. 03/01/18  Yes XFlorencia Reasons MD  traMADol (Veatrice Bourbon 50 MG tablet Take 50 mg by mouth every 12 hours as needed for pain for chronic pain 03/03/18  Yes CLiane Comber NP  vitamin C (ASCORBIC ACID) 500 MG tablet Take 500 mg by mouth 3 (three) times daily.   Yes [provider]    Physical Exam: Vitals:   03/11/18 0100 03/11/18 0115 03/11/18 0146 03/11/18 0413  BP: (!) 134/49  (!) 147/46 (!) 155/56  Pulse: 87 88 91 (!) 106  Resp: 16 17 (!) 22 (!) 22  Temp:   98.3 F (36.8 C) 98.5 F (36.9 C)  TempSrc:   Oral Oral  SpO2: 100% 100% 96% 97%  Weight:   90.7 kg (199 lb 15.3 oz)   Height:   5' 2"  (1.575 m)    General: Not in acute distress HEENT:       Eyes: PERRL, EOMI, no scleral icterus.       ENT: No discharge from the ears and nose, no pharynx injection, no tonsillar enlargement.        Neck: No JVD, no bruit, no mass felt. Heme: No neck lymph node enlargement. Cardiac: S1/S2, RRR, No murmurs, No gallops or rubs. Respiratory: No rales, wheezing, rhonchi or rubs. GI: has distended, tenderness in lower abdomen, no rebound pain, no organomegaly,  BS present. GU: No hematuria Ext: has 2+ leg edema bilaterally and venous insufficiency change. 2+DP/PT  pulse bilaterally. Musculoskeletal: No joint deformities, No joint redness or warmth, no limitation of ROM in spin. Skin: No rashes.  Neuro: Alert, oriented X3, cranial nerves II-XII grossly intact, moves all extremities normally. Psych: Patient is not psychotic, no suicidal or hemocidal ideation.  Labs on Admission: I have personally reviewed following labs and imaging studies  CBC: Recent Labs  Lab 03/07/18 1815 03/10/18 1803 03/11/18 0355  WBC 5.3 5.1 5.6  HGB 8.2* 8.3* 7.6*  HCT 27.8* 27.9* 24.9*  MCV 112.1* 111.6* 109.2*  PLT 98* 115* 275*   Basic Metabolic Panel: Recent Labs  Lab 03/07/18 1815 03/10/18 1803 03/11/18 0355  NA 138 137 138  K 5.0 4.7 4.8  CL 101 101 102  CO2 28 28 25   GLUCOSE 121* 120* 105*  BUN 29* 21 21  CREATININE 1.46* 1.45* 1.44*  CALCIUM 8.6* 7.9* 8.0*   GFR: Estimated Creatinine Clearance: 37.5 mL/min (A) (by C-G formula based on SCr of 1.44 mg/dL (H)). Liver Function Tests: Recent Labs  Lab 03/10/18 1803  AST 23  ALT 19  ALKPHOS 116  BILITOT 1.5*  PROT 4.9*  ALBUMIN 2.6*   Recent Labs  Lab 03/10/18 1803  LIPASE 69*   Recent Labs  Lab 03/11/18 0355  AMMONIA 84*   Coagulation Profile: Recent Labs  Lab 03/11/18 0355  INR 1.17   Cardiac Enzymes: No results for input(s): CKTOTAL, CKMB, CKMBINDEX, TROPONINI in the last 168 hours. BNP (last 3 results) No results for input(s): PROBNP in the last 8760 hours. HbA1C: No results for input(s): HGBA1C in the last 72 hours. CBG: No results for input(s): GLUCAP in the last 168 hours. Lipid Profile: No results for input(s): CHOL, HDL, LDLCALC, TRIG, CHOLHDL, LDLDIRECT in the last 72 hours. Thyroid Function Tests: No results for input(s): TSH, T4TOTAL, FREET4, T3FREE, THYROIDAB in the last 72 hours. Anemia Panel: No results for input(s): VITAMINB12, FOLATE, FERRITIN, TIBC, IRON, RETICCTPCT in the last 72 hours. Urine analysis:    Component Value Date/Time   COLORURINE YELLOW  02/25/2018 1706   APPEARANCEUR CLEAR 02/25/2018 1706   LABSPEC 1.008 02/25/2018 1706   PHURINE 6.0 02/25/2018 1706   GLUCOSEU NEGATIVE 02/25/2018 1706   HGBUR NEGATIVE 02/25/2018 1706   BILIRUBINUR NEGATIVE 02/25/2018 1706   KETONESUR NEGATIVE 02/25/2018 1706   PROTEINUR NEGATIVE 02/25/2018 1706   UROBILINOGEN 1 01/13/2015 1145   NITRITE NEGATIVE 02/25/2018 1706   LEUKOCYTESUR TRACE (A) 02/25/2018 1706   Sepsis Labs: @LABRCNTIP (procalcitonin:4,lacticidven:4) )No results found for this or any previous visit (from the past 240 hour(s)).   Radiological Exams on Admission: Dg Chest 2 View  Result Date: 03/10/2018 CLINICAL DATA:  Shortness of breath.  Abdominal pain/distension. EXAM: CHEST - 2 VIEW COMPARISON:  March 07, 2018 FINDINGS: There is a tiny calcified granuloma in the right upper lung. The heart, hila, and mediastinum are normal. No pneumothorax. Mild opacity in the bases, right greater than left is similar. No overt edema. Right-sided rib fractures again noted. IMPRESSION: 1. Mild bibasilar opacities, similar to mildly improved in the interval may represent atelectasis. Subtle infiltrate is considered less likely. No overt edema. Electronically Signed   By: Dorise Bullion III M.D   On: 03/10/2018 18:55     EKG: Independently reviewed.  Sinus rhythm, QTC 477, low voltage, early R wave progression, right bundle blockade, right axis deviation  Assessment/Plan Principal Problem:   Abdominal pain Active Problems:  Essential hypertension   GERD   Thrombocytopenia (HCC)   COPD GOLD IV/ 02 dep    Anasarca   Ascites   Liver cirrhosis secondary to nonalcoholic steatohepatitis (NASH) (HCC)   Chronic diastolic CHF (congestive heart failure) (HCC)   CKD (chronic kidney disease), stage III (HCC)   Iron deficiency anemia due to chronic blood loss   Abdominal pain and abdominal distention secondary to ascites: Patient had a paracentesis on Friday, but ascite has reaccumulated  quickly.  Patient has lower abdominal pain, cannot completely rule out SBP.  No fever or leukocytosis.  Clinically not septic.  - will admit to telemetry bed as inpatient -start IV rocephin empirically to cover possible SBP -Blood culture -Please call IR for paracentesis in the morning -May need to discuss with her gastroenterologist to see if pt needs permanent drainage. -pt received one dose of lasix 80 mg in ED  Essential hypertension: -IV hydralazine as needed  -Patient is on diuretics, spironolactone and metolazone  GERD: -Protonix  Thrombocytopenia ([place]): Platelets 115, secondary to liver cirrhosis.  No bleeding tendency. -Follow-up with CBC  COPD GOLD IV/ 02 dep : -Continue bronchodilators.  Liver cirrhosis secondary to nonalcoholic steatohepatitis (NASH) ([place]): Mental status normal.  Patient has taking lactulose. -Continue home lactulose -Check ammonia level  Chronic diastolic CHF (congestive heart failure) ([place]): 09/09/2017 showed EF CT deficit 5% with grade 2 diastolic dysfunction.  Patient has mild shortness of breath, but no acute respiratory distress.  Oxygen saturation 100% on room air. -Continue home diuretics  CKD (chronic kidney disease), stage III ([place]): stable.  Baseline creatinine 1.2-1.4.  Her creatinine is 1.45, BUN 21 -Follow-up BMP  Iron deficiency anemia due to chronic blood loss: Hemoglobin stable. Hgb 8.2 on 03/07/18-->8.3 today. -f/u by CBC   DVT ppx: SCD Code Status: Partial code (I discussed with the patient in the presence of her husband, and explained the meaning of CODE STATUS, patient wants to be partial code, OK for CPR, but no intubation).  Family Communication:   Yes, patient's  husbanc at bed side Disposition Plan:  Anticipate discharge back to previous home environment Consults called:  none Admission status:  Inpatient/tele       Date of Service 03/11/2018    Ivor Costa [facility] Pager [phone]  If 7PM-7AM, please contact  night-coverage [email or web address] Password [facility] 03/11/2018, 7:31 AM

## 2018-03-12 ENCOUNTER — Other Ambulatory Visit: Payer: Self-pay

## 2018-03-12 DIAGNOSIS — K7460 Unspecified cirrhosis of liver: Secondary | ICD-10-CM

## 2018-03-12 DIAGNOSIS — C220 Liver cell carcinoma: Secondary | ICD-10-CM

## 2018-03-12 DIAGNOSIS — Z7189 Other specified counseling: Secondary | ICD-10-CM

## 2018-03-12 DIAGNOSIS — D696 Thrombocytopenia, unspecified: Secondary | ICD-10-CM

## 2018-03-12 DIAGNOSIS — I5032 Chronic diastolic (congestive) heart failure: Secondary | ICD-10-CM

## 2018-03-12 DIAGNOSIS — N183 Chronic kidney disease, stage 3 (moderate): Secondary | ICD-10-CM

## 2018-03-12 DIAGNOSIS — Z515 Encounter for palliative care: Secondary | ICD-10-CM

## 2018-03-12 DIAGNOSIS — R188 Other ascites: Secondary | ICD-10-CM

## 2018-03-12 DIAGNOSIS — R627 Adult failure to thrive: Secondary | ICD-10-CM

## 2018-03-12 LAB — TROPONIN I: Troponin I: <0.03 ng/mL (ref <0.03)

## 2018-03-12 NOTE — Progress Notes (Signed)
   Spoke to Dr. Blaine Hamper about Ms. Ahonen - chart reviewed  She has end stage liver disease and refractory ascites so a permanent abdominal drain to improve quality of life is a reasonable option.  If she gets one I would make sure she takes SBP prohylaxis to reduce risk of infection.  GI consult team is available prn  Gatha Mayer, MD, [facility] 03/12/2018 5:50 PM

## 2018-03-12 NOTE — Consult Note (Signed)
Consultation Note Date: 03/12/2018   Patient Name: [name]  DOB: 1946-03-11  MRN: [id number]  Age / Sex: 72 y.o., female  PCP: Unk Pinto, MD Referring Physician: Florencia Reasons, MD  Reason for Consultation: Establishing goals of care  HPI/Patient Profile: 72 y.o. female  with past medical history of COPD (on home O2), hepatic carcinoma s/p ablation, hepatic failure (recurrent ascites, recurrent encephalopathy, esophageal varices) admitted on 03/10/2018 with recurrent abdominal ascites s/p paracentesis 8/6 1.8 L off with sample sent to test for SBP. Patient had recent ED visit on 8/2 and had paracentesis with rapid reaccumulation of fluid. If peritoneal fluid negative for infection then plan is for placement of permanent peritoneal drain.   Clinical Assessment and Goals of Care:  I have reviewed medical records including EPIC notes, labs and imaging, assessed the patient and then met at the bedside along with her spouse- [name]  to discuss diagnosis prognosis, [place], EOL wishes, disposition and options.  I introduced Palliative Medicine as specialized medical care for people living with serious illness. It focuses on providing relief from the symptoms and stress of a serious illness. The goal is to improve quality of life for both the patient and the family.  As far as functional and nutritional status- patient somewhat independent with ADL's- gets up to bedside toilet- requires some assistance with bathing. Is limited in ambulation and uses a wheelchair. She does get SOB with exertion- is on chronic o2. She has a good appetite and is eating lunch- ate 100 percent of her lunch while we spoke.     We discussed their current illness and what it means in the larger context of their on-going co-morbidities.  Natural disease trajectory and expectations at EOL were discussed. I attempted to elicit values and goals of  care important to the patient.  She understands that her COPD and liver failure are affecting her quality and her time of life. She has worries about her kidneys and how they affect her heart and liver as well. She wants to experience as much time, comfortably, with her great grandchildren as possible. She is hopeful that having the permanent drain placed will allow her to take a trip to the beach without have to return to the hospital too quickly for repeat paracentesis.    The difference between aggressive medical intervention and comfort care was considered in light of the patient's goals of care. [name] is not ready for comfort care. She continues to desire some aggressive medical interventions.   Advanced directives, concepts specific to code status, artifical feeding and hydration, and rehospitalization were considered and discussed. [name] has a living will. She also states if she were to experience death- she would not want to be brought back. She states if she were to be without a pulse and not breathing- she would not be brought back to better function or feeling better than she does now. Therefore DNR status was recommended- patient and spouse agreed. She has DNR form at home and has been DNR during  previous hospitalizations.   Hospice and Palliative Care services outpatient were explained and offered. Patient and spouse would like referral for outpatient Palliative to follow.   Questions and concerns were addressed.  The family was encouraged to call with questions or concerns.   Primary Decision Maker PATIENT and her spouse- [place] -DNR -Home with Palliative outpatient -Consider using trazodone rather than ambien for sleep disturbance    Code Status/Advance Care Planning:  DNR  Palliative Prophylaxis:   Delirium Protocol and Frequent Pain Assessment  Additional Recommendations (Limitations, Scope, Preferences):  Full Scope  Treatment  Prognosis:    < 6 months d/t advanced hepatic disease, hepatic carcinoma, COPD, recurrent hospitalizations  Discharge Planning: Home with Palliative Services  Primary Diagnoses: Present on Admission: . Anasarca . Ascites . Chronic diastolic CHF (congestive heart failure) ([place]) . CKD (chronic kidney disease), stage III ([place]) . COPD GOLD IV/ 02 dep  . Essential hypertension . GERD . Iron deficiency anemia due to chronic blood loss . Liver cirrhosis secondary to nonalcoholic steatohepatitis (NASH) ([place]) . Thrombocytopenia ([place]) . Abdominal pain   I have reviewed the medical record, interviewed the patient and family, and examined the patient. The following aspects are pertinent.  Past Medical History:  Diagnosis Date  . Asthma   . Cancer [facility])    liver cancer   . COPD (chronic obstructive pulmonary disease) ([place])    x 3 years   . Depression   . Diabetes mellitus without complication ([place])    diet controlled  not on meds   . Edema    left leg   . Esophageal varices ([place])   . Fibromyalgia   . Gastritis   . GERD (gastroesophageal reflux disease)   . Hepatic cirrhosis ([place])   . Hepatic encephalopathy ([place])   . History of blood transfusion   . Hyperlipidemia   . Hypertension   . IBS (irritable bowel syndrome)   . Phlebitis    left leg x 2   . Pneumonia    hx of   . Splenomegaly   . Vitamin D deficiency    Social History   Socioeconomic History  . Marital status: Married    Spouse name: [name]  . Number of children: 5  . Years of education: Not on file  . Highest education level: Not on file  Occupational History  . Occupation: Arboriculturist: UNEMPLOYED  Social Needs  . Financial resource strain: Not on file  . Food insecurity:    Worry: Not on file    Inability: Not on file  . Transportation needs:    Medical: Not on file    Non-medical: Not on file  Tobacco Use  . Smoking status: Former Smoker    Packs/day: 1.00    Years: 50.00     Pack years: 50.00    Types: Cigarettes    Last attempt to quit: 11/05/2014    Years since quitting: 3.3  . Smokeless tobacco: Never Used  . Tobacco comment: Quit April 2016  Substance and Sexual Activity  . Alcohol use: No    Alcohol/week: 0.0 oz  . Drug use: No  . Sexual activity: Not on file  Lifestyle  . Physical activity:    Days per week: Not on file    Minutes per session: Not on file  . Stress: Not on file  Relationships  . Social connections:    Talks on phone: Not on file    Gets together:  Not on file    Attends religious service: Not on file    Active member of club or organization: Not on file    Attends meetings of clubs or organizations: Not on file    Relationship status: Not on file  Other Topics Concern  . Not on file  Social History Narrative  . Not on file   Family History  Problem Relation Age of Onset  . Diabetes Brother        deceased  . Heart disease Sister        A Fib  . Heart disease Mother        CHF  . Atrial fibrillation Sister   . Cancer Father        lung cancer   . Cancer Maternal Uncle        unknown type cancer   . Cancer Other        lung cancer   . Colon cancer Neg Hx    Scheduled Meds: . allopurinol  300 mg Oral Daily  . arformoterol  15 mcg Nebulization BID  . aspirin EC  81 mg Oral QHS  . cholecalciferol  5,000 Units Oral Q breakfast  . citalopram  40 mg Oral Daily  . ferrous sulfate  325 mg Oral Q breakfast  . gabapentin  600 mg Oral TID  . ipratropium-albuterol  3 mL Nebulization TID  . lactulose  20 g Oral Daily  . magnesium oxide  400 mg Oral QHS  . metolazone  10 mg Oral Daily  . midodrine  5 mg Oral TID WC  . pantoprazole  40 mg Oral Daily  . rifampin  300 mg Oral BID  . spironolactone  25 mg Oral BID  . umeclidinium bromide  1 puff Inhalation Daily   Continuous Infusions: . cefTRIAXone (ROCEPHIN)  IV 1 g (03/11/18 2217)   PRN Meds:.albuterol, ALPRAZolam, cyclobenzaprine, hydrALAZINE, morphine injection,  ondansetron (ZOFRAN) IV, sodium chloride, traMADol, zolpidem Medications Prior to Admission:  Prior to Admission medications   Medication Sig Start Date End Date Taking? Authorizing Provider  albuterol (PROVENTIL HFA;VENTOLIN HFA) 108 (90 Base) MCG/ACT inhaler Inhale 2 puffs into the lungs every 6 (six) hours as needed for wheezing or shortness of breath. 05/27/16  Yes Ward, Delice Bison, DO  allopurinol (ZYLOPRIM) 300 MG tablet take 1 tablet (300 mg) by mouth daily with supper - for gout 03/04/18  Yes Unk Pinto, MD  ALPRAZolam Duanne Moron) 0.25 MG tablet Take 1 tab as needed for anxiety, cramping, air hunger every 8 hours as needed. 11/01/17  Yes Liane Comber, NP  aspirin EC 81 MG tablet Take 81 mg by mouth at bedtime.   Yes [provider]  Cholecalciferol (VITAMIN D3) 5000 units CAPS Take 5,000 Units by mouth daily with breakfast.   Yes [provider]  citalopram (CELEXA) 40 MG tablet Take 1 tablet (40 mg total) by mouth daily. 03/01/18  Yes Florencia Reasons, MD  cyclobenzaprine (FLEXERIL) 10 MG tablet Take 1 tablet (10 mg total) by mouth 3 (three) times daily as needed for muscle spasms. 03/06/16  Yes Unk Pinto, MD  ferrous sulfate 325 (65 FE) MG tablet Take 1 tablet (325 mg total) by mouth daily with breakfast. 03/01/18  Yes Florencia Reasons, MD  gabapentin (NEURONTIN) 600 MG tablet Take 600 mg by mouth 3 (three) times daily.    Yes [provider]  Glycopyrrolate-Formoterol (BEVESPI AEROSPHERE) 9-4.8 MCG/ACT AERO Inhale 2 puffs into the lungs 2 (two) times daily. 12/25/17  Yes  Tanda Rockers, MD  ipratropium (ATROVENT) 0.02 % nebulizer solution Take 3 mLs by nebulization every 4 (four) hours as needed for wheezing or shortness of breath. 01/29/18  Yes [provider]  lactulose (CHRONULAC) 10 GM/15ML solution take 38ms TWICE DAILY Patient taking differently: Take 20 g by mouth daily.  08/08/17  Yes MUnk Pinto MD  Magnesium 250 MG TABS Take 250 mg by mouth at  bedtime.    Yes [provider]  metolazone (ZAROXOLYN) 5 MG tablet Take 1 tablet daily or as directed for fluid Overload Patient taking differently: Take 10 mg by mouth daily.  02/20/18  Yes MUnk Pinto MD  midodrine (PROAMATINE) 5 MG tablet Take 1 tablet (5 mg total) by mouth 3 (three) times daily with meals. 03/01/18  Yes XFlorencia Reasons MD  ondansetron (ZOFRAN) 4 MG tablet Take 1 tablet (4 mg total) by mouth every 6 (six) hours as needed for nausea or vomiting. 09/02/17  Yes CVicie Mutters PA-C  OXYGEN Inhale 2-3 L into the lungs continuous. 3 L with exertion   Yes [provider]  pantoprazole (PROTONIX) 40 MG tablet Take 1 tablet (40 mg total) by mouth daily. 03/04/18  Yes MUnk Pinto MD  potassium chloride (K-DUR) 10 MEQ tablet Take 1 tablet (10 mEq total) by mouth daily. TAKE 2 TABLETS BY MOUTH 3 TIMES A DAY. Patient taking differently: Take 20 mEq by mouth 3 (three) times daily.  03/01/18  Yes XFlorencia Reasons MD  rifampin (RIFADIN) 300 MG capsule Take 1 capsule by mouth 2 (two) times daily.   Yes [provider]  spironolactone (ALDACTONE) 25 MG tablet Take 1 tablet (25 mg total) by mouth 2 (two) times daily. 03/01/18  Yes XFlorencia Reasons MD  traMADol (Veatrice Bourbon 50 MG tablet Take 50 mg by mouth every 12 hours as needed for pain for chronic pain 03/03/18  Yes CLiane Comber NP  vitamin C (ASCORBIC ACID) 500 MG tablet Take 500 mg by mouth 3 (three) times daily.   Yes [provider]   Allergies  Allergen Reactions  . Atorvastatin Other (See Comments)    Unknown reaction  . Diphenhydramine Hcl (Sleep) Hives  . Hydrocodone-Acetaminophen Other (See Comments)    Unknown reaction  . Lopid [Gemfibrozil] Other (See Comments)    Unknown reaction  . Loratadine Hives  . Lorazepam Hives  . Simvastatin Other (See Comments)    Unknown reaction  . Sulfamethoxazole Hives  . Sulfonamide Derivatives Hives   Review of Systems  Physical Exam  Vital Signs: BP (!) 123/41  (BP Location: Left Arm)   Pulse 99   Temp 97.9 F (36.6 C)   Resp 17   Ht 5' 2" (1.575 m)   Wt 90.7 kg (199 lb 15.3 oz)   SpO2 99%   BMI 36.57 kg/m  Pain Scale: 0-10   Pain Score: Asleep   SpO2: SpO2: 99 % O2 Device:SpO2: 99 % O2 Flow Rate: .O2 Flow Rate (L/min): 3 L/min  IO: Intake/output summary: No intake or output data in the 24 hours ending 03/12/18 1239  LBM: Last BM Date: 03/11/18 Baseline Weight: Weight: 88.9 kg (196 lb) Most recent weight: Weight: 90.7 kg (199 lb 15.3 oz)     Palliative Assessment/Data: PPS: 50%     Thank you for this consult. Palliative medicine will continue to follow and assist as needed.   Time In: 1100 Time Out: 1300 Time Total: 120 minutes Greater than 50%  of this time was spent counseling and coordinating care related  to the above assessment and plan.  Signed by: Mariana Kaufman, AGNP-C Palliative Medicine    Please contact Palliative Medicine Team phone at [phone] for questions and concerns.  For individual provider: See Shea Evans

## 2018-03-12 NOTE — Significant Event (Signed)
Rapid Response Event Note  Overview: Called by bedside RN for pt c/o CP 10/10. Instructed to get EKG and recent set of Vitals.   Initial Focused Assessment: Upon arrival, pt alert and oriented stating her chest feels "like someone is sitting on it". HR 98, BP 133/57, RR 16 spO2 94% on 3L. Audible expiratory wheezing noted throughout. Pt also c/o abd pain and congestion (present since admission). As I continued to talk to pt she stated that the pain starts in her abdomen and continues up to chest and that she has had this happen in the past and it resolves on its own. Pt appears to be anxious, with constant concern stating her abd is "filling back up with fluid" (received para yesterday with 1.8L off).   Interventions: EKG- no change from prior Zofran for nausea Albuterol given for wheezing NP- Schorr paged to make aware of pt complaints and interventions given  Plan of Care (if not transferred):  Pt stabilized and remains on unit. After interventions pt states pain decreased 2/10 with less wheezing noted on auscultation, but still continues to c/o abd discomfort.Continue to monitor. Bedside RN instructed to call with any questions or concerns.     Event Summary:  Called at  0535   Event ended at  [place]

## 2018-03-12 NOTE — Progress Notes (Signed)
PROGRESS NOTE  [name] QQP:[id number] DOB: 1946-06-18 DOA: 03/10/2018 PCP: Unk Pinto, MD  HPI/Recap of past 24 hours:  Feeling better after paracentesis Husband at bedside  Assessment/Plan: Principal Problem:   Abdominal pain Active Problems:   Essential hypertension   GERD   Thrombocytopenia (HCC)   COPD GOLD IV/ 02 dep    Anasarca   Ascites   Liver cirrhosis secondary to nonalcoholic steatohepatitis (NASH) (HCC)   Chronic diastolic CHF (congestive heart failure) (HCC)   CKD (chronic kidney disease), stage III (HCC)   Iron deficiency anemia due to chronic blood loss   Advanced care planning/counseling discussion   Palliative care by specialist   Decompensated cirrhosis with Recurrent ascites: -H/o grade 1 esophageal varices and moderate portal gastropathy -she was feeling better after discharge, but per patient,  ascites reoccured in just a few days after discharge and continue to get worse -she denies ab pain, she has another paracentesis done  On 8/6 with 1.8 liter removed, gram stain negative for bacteremia -she has refractory ascites, not responding to/not able to tolerate diuretics, she is recently started on midodrine - she likes to consider palliative catheter placement to avoid frequent hospitalization, case discussed with GI Dr Carlean Purl who think "She has end stage liver disease and refractory ascites so a permanent abdominal drain to improve quality of life is a reasonable option"  "If she gets one I would make sure she takes SBP prohylaxis to reduce risk of infection." -IR consulted    Hepatic encephalopathy: stable  oriented x3, continue lactulose, not able to afford rifaximin   H/o Stage 2 HCC, s/p IR ablation x 2 (lesions in left and right lobes) in 08/2017 MRI 12/2017 with ablation related defects, no new lesions.     chronic anemia, macrocytic With h/o gi bleed in 09/2017. H/o grade 1 esophageal varices and moderate portal  gastropathy Continue iron supplement   Thrombocytopenia: likely from cirrhosis Stable   CKDIII Renal function at baseline  Right upper extremity edema: Intermittent, chronic,  Negative dvt study during recent hospitalization Patient has right shoulder surgery when she was [age] weeks old, right arm hypoplasia    Chronic diastolic chf -volume overloaded , likely due to cirrhosis -refractory to diuretics  FTT:  Palliative care consulted, she is DNR, but she is not ready for hospice yet   I have reviewed tele strip, has been stable, will d/c tele  Code Status: DNR  Family Communication: patient and husband  Disposition Plan: home in 1-2 days   Consultants:  Gi  IR  Palliative care  Procedures:  Paracentesis   Antibiotics:  rocephin   Objective: BP 125/88 (BP Location: Left Arm)   Pulse 88   Temp 98.2 F (36.8 C) (Oral)   Resp 20   Ht 5' 2"  (1.575 m)   Wt 90.7 kg (199 lb 15.3 oz)   SpO2 99%   BMI 36.57 kg/m  No intake or output data in the 24 hours ending 03/12/18 2227 Filed Weights   03/10/18 1757 03/11/18 0146  Weight: 88.9 kg (196 lb) 90.7 kg (199 lb 15.3 oz)    Exam: Patient is examined daily including today on 03/12/2018, exams remain the same as of yesterday except that has changed    General:  Frail but NAD  Cardiovascular: RRR  Respiratory: diminished  Abdomen: distended abdomen, NT, positive BS  Musculoskeletal: + Edema  Neuro: alert, oriented   Data Reviewed: Basic Metabolic Panel: Recent Labs  Lab 03/07/18 1815 03/10/18 1803  03/11/18 0355  NA 138 137 138  K 5.0 4.7 4.8  CL 101 101 102  CO2 28 28 25   GLUCOSE 121* 120* 105*  BUN 29* 21 21  CREATININE 1.46* 1.45* 1.44*  CALCIUM 8.6* 7.9* 8.0*   Liver Function Tests: Recent Labs  Lab 03/10/18 1803  AST 23  ALT 19  ALKPHOS 116  BILITOT 1.5*  PROT 4.9*  ALBUMIN 2.6*   Recent Labs  Lab 03/10/18 1803  LIPASE 69*   Recent Labs  Lab 03/11/18 0355   AMMONIA 84*   CBC: Recent Labs  Lab 03/07/18 1815 03/10/18 1803 03/11/18 0355  WBC 5.3 5.1 5.6  HGB 8.2* 8.3* 7.6*  HCT 27.8* 27.9* 24.9*  MCV 112.1* 111.6* 109.2*  PLT 98* 115* 110*   Cardiac Enzymes:   Recent Labs  Lab 03/12/18 0728  TROPONINI <0.03   BNP (last 3 results) Recent Labs    09/08/17 1107 09/16/17 1253 02/25/18 1538  BNP 144.3* 157.0* 283.7*    ProBNP (last 3 results) No results for input(s): PROBNP in the last 8760 hours.  CBG: No results for input(s): GLUCAP in the last 168 hours.  Recent Results (from the past 240 hour(s))  Culture, blood (Routine X 2) w Reflex to ID Panel     Status: None (Preliminary result)   Collection Time: 03/11/18  3:50 AM  Result Value Ref Range Status   Specimen Description BLOOD LEFT ARM  Final   Special Requests   Final    BOTTLES DRAWN AEROBIC ONLY Blood Culture adequate volume   Culture   Final    NO GROWTH 1 DAY Performed at [facility], [address], [place], [place] [address]    Report Status PENDING  Incomplete  Culture, blood (Routine X 2) w Reflex to ID Panel     Status: None (Preliminary result)   Collection Time: 03/11/18  3:55 AM  Result Value Ref Range Status   Specimen Description BLOOD LEFT WRIST  Final   Special Requests   Final    BOTTLES DRAWN AEROBIC ONLY Blood Culture adequate volume   Culture   Final    NO GROWTH 1 DAY Performed at [facility], [place] [address], [place], [place] [address]    Report Status PENDING  Incomplete  Gram stain     Status: None   Collection Time: 03/11/18 12:22 PM  Result Value Ref Range Status   Specimen Description FLUID PERITONEAL  Final   Special Requests NONE  Final   Gram Stain   Final    RARE WBC PRESENT, PREDOMINANTLY MONONUCLEAR NO ORGANISMS SEEN Performed at [facility], [place] [address], [place], [place] [address]    Report Status 03/11/2018 FINAL  Final  Culture, body fluid-bottle     Status: None (Preliminary result)    Collection Time: 03/11/18 12:22 PM  Result Value Ref Range Status   Specimen Description FLUID PERITONEAL  Final   Special Requests NONE  Final   Culture   Final    NO GROWTH 1 DAY Performed at [facility], [place] [address], [place],  [address]    Report Status PENDING  Incomplete     Studies: No results found.  Scheduled Meds: . allopurinol  300 mg Oral Daily  . arformoterol  15 mcg Nebulization BID  . aspirin EC  81 mg Oral QHS  . cholecalciferol  5,000 Units Oral Q breakfast  . citalopram  40 mg Oral Daily  .  ferrous sulfate  325 mg Oral Q breakfast  . gabapentin  600 mg Oral TID  . ipratropium-albuterol  3 mL Nebulization TID  . lactulose  20 g Oral Daily  . magnesium oxide  400 mg Oral QHS  . metolazone  10 mg Oral Daily  . midodrine  5 mg Oral TID WC  . pantoprazole  40 mg Oral Daily  . rifampin  300 mg Oral BID  . spironolactone  25 mg Oral BID  . umeclidinium bromide  1 puff Inhalation Daily    Continuous Infusions: . cefTRIAXone (ROCEPHIN)  IV 1 g (03/11/18 2217)     Time spent: 70mns I have personally reviewed and interpreted on  03/12/2018 daily labs, tele strips, imagings as discussed above under date review session and assessment and plans.  I reviewed all nursing notes, pharmacy notes, consultant notes,  vitals, pertinent old records  I have discussed plan of care as described above with RN , patient and family on 03/12/2018   FFlorencia ReasonsMD, PhD  [facility] Pager [phone] If 7PM-7AM, please contact night-coverage at [email or web address], password [email or web address]8/02/2018, 10:27 PM  LOS: 1 day

## 2018-03-13 ENCOUNTER — Ambulatory Visit: Payer: PPO | Admitting: Physician Assistant

## 2018-03-13 DIAGNOSIS — K3189 Other diseases of stomach and duodenum: Secondary | ICD-10-CM

## 2018-03-13 DIAGNOSIS — K766 Portal hypertension: Secondary | ICD-10-CM

## 2018-03-13 DIAGNOSIS — D649 Anemia, unspecified: Secondary | ICD-10-CM

## 2018-03-13 LAB — COMPREHENSIVE METABOLIC PANEL
ALBUMIN: 2.1 g/dL — AB (ref 3.5–5.0)
ALK PHOS: 95 U/L (ref 38–126)
ALT: 16 U/L (ref 0–44)
AST: 20 U/L (ref 15–41)
Anion gap: 8 (ref 5–15)
BILIRUBIN TOTAL: 1 mg/dL (ref 0.3–1.2)
BUN: 22 mg/dL (ref 8–23)
CALCIUM: 8.1 mg/dL — AB (ref 8.9–10.3)
CO2: 28 mmol/L (ref 22–32)
CREATININE: 1.58 mg/dL — AB (ref 0.44–1.00)
Chloride: 102 mmol/L (ref 98–111)
GFR calc Af Amer: 37 mL/min — ABNORMAL LOW (ref >60)
GFR calc non Af Amer: 32 mL/min — ABNORMAL LOW (ref >60)
GLUCOSE: 94 mg/dL (ref 70–99)
Potassium: 3.9 mmol/L (ref 3.5–5.1)
SODIUM: 138 mmol/L (ref 135–145)
TOTAL PROTEIN: 3.9 g/dL — AB (ref 6.5–8.1)

## 2018-03-13 LAB — CBC
HCT: 21.2 % — ABNORMAL LOW (ref 36.0–46.0)
Hemoglobin: 6.5 g/dL — CL (ref 12.0–15.0)
MCH: 33.5 pg (ref 26.0–34.0)
MCHC: 30.7 g/dL (ref 30.0–36.0)
MCV: 109.3 fL — AB (ref 78.0–100.0)
PLATELETS: 96 10*3/uL — AB (ref 150–400)
RBC: 1.94 MIL/uL — ABNORMAL LOW (ref 3.87–5.11)
RDW: 16.9 % — AB (ref 11.5–15.5)
WBC: 3.9 10*3/uL — AB (ref 4.0–10.5)

## 2018-03-13 LAB — AMMONIA
AMMONIA: 78 umol/L — AB (ref 9–35)
Ammonia: 55 umol/L — ABNORMAL HIGH (ref 9–35)

## 2018-03-13 LAB — PREPARE RBC (CROSSMATCH)

## 2018-03-13 MED ORDER — SODIUM CHLORIDE 0.9% IV SOLUTION
Freq: Once | INTRAVENOUS | Status: AC
  Administered 2018-03-13: 11:00:00 via INTRAVENOUS

## 2018-03-13 NOTE — Progress Notes (Signed)
PROGRESS NOTE  [name] KKX:[id number] DOB: 08-Feb-1946 DOA: 03/10/2018 PCP: Unk Pinto, MD  HPI/Recap of past 24 hours:  hgb trending down Husband at bedside  Assessment/Plan: Principal Problem:   Abdominal pain Active Problems:   Essential hypertension   GERD   Thrombocytopenia (HCC)   COPD GOLD IV/ 02 dep    Anasarca   Ascites   Liver cirrhosis secondary to nonalcoholic steatohepatitis (NASH) (HCC)   Chronic diastolic CHF (congestive heart failure) (HCC)   CKD (chronic kidney disease), stage III (HCC)   Iron deficiency anemia due to chronic blood loss   Advanced care planning/counseling discussion   Palliative care by specialist   Decompensated cirrhosis with Recurrent ascites: -H/o grade 1 esophageal varices and moderate portal gastropathy -she was feeling better after discharge, but per patient,  ascites reoccured in just a few days after discharge and continue to get worse -she denies ab pain, she has another paracentesis done  On 8/6 with 1.8 liter removed, gram stain negative for bacteremia -she has refractory ascites, not responding to/not able to tolerate diuretics, she is recently started on midodrine - she likes to consider palliative catheter placement to avoid frequent hospitalization, case discussed with GI Dr Carlean Purl who think "She has end stage liver disease and refractory ascites so a permanent abdominal drain to improve quality of life is a reasonable option"  "If she gets one I would make sure she takes SBP prohylaxis to reduce risk of infection." -IR consulted   Acute on chronic anemia, macrocytic, from chronic GI blood loss With h/o gi bleed in 09/2017. H/o grade 1 esophageal varices and moderate portal gastropathy Continue iron supplement hgb 6.5 this am, transfuse prbcx1 units to keep hgb >7.  Thrombocytopenia: likely from cirrhosis Stable   Hepatic encephalopathy: stable  oriented x3, continue lactulose, not able to afford  rifaximin   H/o Stage 2 HCC, s/p IR ablation x 2 (lesions in left and right lobes) in 08/2017 MRI 12/2017 with ablation related defects, no new lesions.      CKDIII Renal function at baseline  Right upper extremity edema: Intermittent, chronic,  Negative dvt study during recent hospitalization Patient has right shoulder surgery when she was three weeks old, right arm hypoplasia    Chronic diastolic chf -volume overloaded , likely due to cirrhosis -refractory to diuretics  FTT:  Palliative care consulted, she is DNR, but she is not ready for hospice yet     Code Status: DNR  Family Communication: patient and husband  Disposition Plan: home , hopefully on 8/9 after  tunneled peritoneal drainage catheter placement  need IR and GI clearance for discharge   Consultants:  Gi  IR  Palliative care  Procedures:  Paracentesis on 8/6  Antibiotics:  rocephin   Objective: BP 131/66 (BP Location: Right Arm)   Pulse (!) 101   Temp 98.5 F (36.9 C) (Oral)   Resp 18   Ht 5' 2"  (1.575 m)   Wt 90.7 kg   SpO2 98%   BMI 36.57 kg/m   Intake/Output Summary (Last 24 hours) at 03/13/2018 1456 Last data filed at 03/13/2018 1000 Gross per 24 hour  Intake -  Output 400 ml  Net -400 ml   Filed Weights   03/10/18 1757 03/11/18 0146  Weight: 88.9 kg 90.7 kg    Exam: Patient is examined daily including today on 03/13/2018, exams remain the same as of yesterday except that has changed    General:  Frail but NAD  Cardiovascular:  RRR  Respiratory: diminished  Abdomen: distended abdomen, NT, positive BS  Musculoskeletal: + Edema  Neuro: alert, oriented   Data Reviewed: Basic Metabolic Panel: Recent Labs  Lab 03/07/18 1815 03/10/18 1803 03/11/18 0355 03/13/18 0526  NA 138 137 138 138  K 5.0 4.7 4.8 3.9  CL 101 101 102 102  CO2 28 28 25 28   GLUCOSE 121* 120* 105* 94  BUN 29* 21 21 22   CREATININE 1.46* 1.45* 1.44* 1.58*  CALCIUM 8.6* 7.9* 8.0* 8.1*    Liver Function Tests: Recent Labs  Lab 03/10/18 1803 03/13/18 0526  AST 23 20  ALT 19 16  ALKPHOS 116 95  BILITOT 1.5* 1.0  PROT 4.9* 3.9*  ALBUMIN 2.6* 2.1*   Recent Labs  Lab 03/10/18 1803  LIPASE 69*   Recent Labs  Lab 03/11/18 0355 03/13/18 0526  AMMONIA 84* 55*   CBC: Recent Labs  Lab 03/07/18 1815 03/10/18 1803 03/11/18 0355 03/13/18 0526  WBC 5.3 5.1 5.6 3.9*  HGB 8.2* 8.3* 7.6* 6.5*  HCT 27.8* 27.9* 24.9* 21.2*  MCV 112.1* 111.6* 109.2* 109.3*  PLT 98* 115* 110* 96*   Cardiac Enzymes:   Recent Labs  Lab 03/12/18 0728  TROPONINI <0.03   BNP (last 3 results) Recent Labs    09/08/17 1107 09/16/17 1253 02/25/18 1538  BNP 144.3* 157.0* 283.7*    ProBNP (last 3 results) No results for input(s): PROBNP in the last 8760 hours.  CBG: No results for input(s): GLUCAP in the last 168 hours.  Recent Results (from the past 240 hour(s))  Culture, blood (Routine X 2) w Reflex to ID Panel     Status: None (Preliminary result)   Collection Time: 03/11/18  3:50 AM  Result Value Ref Range Status   Specimen Description BLOOD LEFT ARM  Final   Special Requests   Final    BOTTLES DRAWN AEROBIC ONLY Blood Culture adequate volume   Culture   Final    NO GROWTH 2 DAYS Performed at [facility], [address] [place], [place] [address]    Report Status PENDING  Incomplete  Culture, blood (Routine X 2) w Reflex to ID Panel     Status: None (Preliminary result)   Collection Time: 03/11/18  3:55 AM  Result Value Ref Range Status   Specimen Description BLOOD LEFT WRIST  Final   Special Requests   Final    BOTTLES DRAWN AEROBIC ONLY Blood Culture adequate volume   Culture   Final    NO GROWTH 2 DAYS Performed at [facility], [place] [address], [place], [place] [address]    Report Status PENDING  Incomplete  Gram stain     Status: None   Collection Time: 03/11/18 12:22 PM  Result Value Ref Range Status   Specimen Description FLUID PERITONEAL   Final   Special Requests NONE  Final   Gram Stain   Final    RARE WBC PRESENT, PREDOMINANTLY MONONUCLEAR NO ORGANISMS SEEN Performed at [facility], [place] [address], [place], [place] [address]    Report Status 03/11/2018 FINAL  Final  Culture, body fluid-bottle     Status: None (Preliminary result)   Collection Time: 03/11/18 12:22 PM  Result Value Ref Range Status   Specimen Description FLUID PERITONEAL  Final   Special Requests NONE  Final   Culture   Final    NO GROWTH 1 DAY Performed at [facility], [place] [address], [place], [place] [address]  Report Status PENDING  Incomplete     Studies: No results found.  Scheduled Meds: . allopurinol  300 mg Oral Daily  . arformoterol  15 mcg Nebulization BID  . aspirin EC  81 mg Oral QHS  . cholecalciferol  5,000 Units Oral Q breakfast  . citalopram  40 mg Oral Daily  . ferrous sulfate  325 mg Oral Q breakfast  . gabapentin  600 mg Oral TID  . ipratropium-albuterol  3 mL Nebulization TID  . lactulose  20 g Oral Daily  . magnesium oxide  400 mg Oral QHS  . metolazone  10 mg Oral Daily  . midodrine  5 mg Oral TID WC  . pantoprazole  40 mg Oral Daily  . spironolactone  25 mg Oral BID  . umeclidinium bromide  1 puff Inhalation Daily    Continuous Infusions: . cefTRIAXone (ROCEPHIN)  IV 1 g (03/12/18 2300)     Time spent: 63mns, case discussed with GI and IR I have personally reviewed and interpreted on  03/13/2018 daily labs,  imagings as discussed above under date review session and assessment and plans.  I reviewed all nursing notes, pharmacy notes, consultant notes,  vitals, pertinent old records  I have discussed plan of care as described above with RN , patient and family on 03/13/2018   FFlorencia ReasonsMD, PhD  [facility] Pager [phone] If 7PM-7AM, please contact night-coverage at [email or web address], password [email or web address]8/03/2018, 2:56 PM  LOS: 2 days

## 2018-03-13 NOTE — Progress Notes (Signed)
Chief Complaint: Patient was seen in consultation today for refractory ascites at the request of Dr. Florencia Reasons  Referring Physician(s): Dr. Florencia Reasons  Supervising Physician: Aletta Edouard  Patient Status: [facility] - In-pt  History of Present Illness: [name] is a 72 y.o. female well known to IR service with complex medical history including COPD, [place] s/p MWA in 1/19, cirrhosis with hepatic failure and refractory ascites requiring frequent paracentesis. She was recently admitted with abd distention and had paracentesis on 8/6. Fluid gram stain and Cx negative for SBP. She has developed rapid recurrent abd distention. After GI consultation and medical eval, IR is asked to consider placement of tunneled peritoneal catheter. She is already DNR and in Palliative care, progressing towards Hospice. She expresses wishes to spend time with family and to not have to keep coming to the hospital. Chart, imaging, meds,labs, allergies reviewed.  Past Medical History:  Diagnosis Date  . Asthma   . Cancer [facility])    liver cancer   . COPD (chronic obstructive pulmonary disease) ([place])    x 3 years   . Depression   . Diabetes mellitus without complication ([place])    diet controlled  not on meds   . Edema    left leg   . Esophageal varices ([place])   . Fibromyalgia   . Gastritis   . GERD (gastroesophageal reflux disease)   . Hepatic cirrhosis ([place])   . Hepatic encephalopathy ([place])   . History of blood transfusion   . Hyperlipidemia   . Hypertension   . IBS (irritable bowel syndrome)   . Phlebitis    left leg x 2   . Pneumonia    hx of   . Splenomegaly   . Vitamin D deficiency     Past Surgical History:  Procedure Laterality Date  . ABDOMINAL HYSTERECTOMY    . BACK SURGERY    . CATARACT EXTRACTION, BILATERAL    . CHOLECYSTECTOMY    . ESOPHAGOGASTRODUODENOSCOPY  07/16/2012   Procedure: ESOPHAGOGASTRODUODENOSCOPY (EGD);  Surgeon: Inda Castle, MD;  Location: [facility];   Service: Endoscopy;  Laterality: N/A;  . ESOPHAGOGASTRODUODENOSCOPY N/A 01/20/2018   Procedure: ESOPHAGOGASTRODUODENOSCOPY (EGD);  Surgeon: Ladene Artist, MD;  Location: [facility]COPY;  Service: Endoscopy;  Laterality: N/A;  . GASTRIC VARICES BANDING  07/16/2012   Procedure: GASTRIC VARICES BANDING;  Surgeon: Inda Castle, MD;  Location: [facility] ENDOSCOPY;  Service: Endoscopy;  Laterality: N/A;  . HIP ARTHROPLASTY Bilateral   . IR GENERIC HISTORICAL  06/25/2016   IR RADIOLOGIST EVAL & MGMT 06/25/2016 [facility]-INTERV RAD  . IR GENERIC HISTORICAL  06/27/2016   IR KYPHO LUMBAR INC FX REDUCE BONE BX UNI/BIL CANNULATION INC/IMAGING 06/27/2016 Luanne Bras, MD [facility]INTERV RAD  . IR GENERIC HISTORICAL  07/17/2016   IR RADIOLOGIST EVAL & MGMT 07/17/2016 [facility]  . IR PARACENTESIS  01/20/2018  . IR PARACENTESIS  02/27/2018  . IR PARACENTESIS  03/11/2018  . IR RADIOLOGIST EVAL & MGMT  07/10/2017  . IR RADIOLOGIST EVAL & MGMT  10/01/2017  . IR RADIOLOGIST EVAL & MGMT  12/11/2017  . IR THORACENTESIS ASP PLEURAL SPACE W/IMG GUIDE  09/12/2017  . IR THORACENTESIS ASP PLEURAL SPACE W/IMG GUIDE  09/17/2017  . NECK SURGERY    . RADIOFREQUENCY ABLATION N/A 08/30/2017   Procedure: CT MICROWAVE THERMAL ABLATION;  Surgeon: Arne Cleveland, MD;  Location: [facility] ORS;  Service: Anesthesiology;  Laterality: N/A;  . SHOULDER SURGERY Right     Allergies: Atorvastatin; Diphenhydramine hcl (sleep);  Hydrocodone-acetaminophen; Lopid [gemfibrozil]; Loratadine; Lorazepam; Simvastatin; Sulfamethoxazole; and Sulfonamide derivatives  Medications:  Current Facility-Administered Medications:  .  albuterol (PROVENTIL) (2.5 MG/3ML) 0.083% nebulizer solution 2.5 mg, 2.5 mg, Nebulization, Q4H PRN, Ivor Costa, MD, 2.5 mg at 03/12/18 0604 .  allopurinol (ZYLOPRIM) tablet 300 mg, 300 mg, Oral, Daily, Ivor Costa, MD, 300 mg at 03/13/18 1029 .  ALPRAZolam Duanne Moron) tablet 0.25 mg, 0.25 mg, Oral, TID PRN, Ivor Costa, MD, 0.25 mg at 03/13/18  0145 .  arformoterol (BROVANA) nebulizer solution 15 mcg, 15 mcg, Nebulization, BID, Ivor Costa, MD, 15 mcg at 03/13/18 2202 .  aspirin EC tablet 81 mg, 81 mg, Oral, QHS, Ivor Costa, MD, 81 mg at 03/12/18 2254 .  cefTRIAXone (ROCEPHIN) 1 g in sodium chloride 0.9 % 100 mL IVPB, 1 g, Intravenous, QHS, Ivor Costa, MD, Last Rate: 200 mL/hr at 03/12/18 2300, 1 g at 03/12/18 2300 .  cholecalciferol (VITAMIN D) tablet 5,000 Units, 5,000 Units, Oral, Q breakfast, Ivor Costa, MD, 5,000 Units at 03/13/18 1028 .  citalopram (CELEXA) tablet 40 mg, 40 mg, Oral, Daily, Ivor Costa, MD, 40 mg at 03/13/18 1029 .  cyclobenzaprine (FLEXERIL) tablet 10 mg, 10 mg, Oral, TID PRN, Ivor Costa, MD, 10 mg at 03/12/18 2259 .  ferrous sulfate tablet 325 mg, 325 mg, Oral, Q breakfast, Ivor Costa, MD, 325 mg at 03/13/18 1029 .  gabapentin (NEURONTIN) tablet 600 mg, 600 mg, Oral, TID, Ivor Costa, MD, 600 mg at 03/13/18 1038 .  hydrALAZINE (APRESOLINE) injection 5 mg, 5 mg, Intravenous, Q2H PRN, Ivor Costa, MD .  ipratropium-albuterol (DUONEB) 0.5-2.5 (3) MG/3ML nebulizer solution 3 mL, 3 mL, Nebulization, TID, Arrien, Jimmy Picket, MD, 3 mL at 03/13/18 1401 .  lactulose (CHRONULAC) 10 GM/15ML solution 20 g, 20 g, Oral, Daily, Ivor Costa, MD, 20 g at 03/13/18 1038 .  magnesium oxide (MAG-OX) tablet 400 mg, 400 mg, Oral, QHS, Ivor Costa, MD, 400 mg at 03/12/18 2254 .  metolazone (ZAROXOLYN) tablet 10 mg, 10 mg, Oral, Daily, Ivor Costa, MD, 10 mg at 03/13/18 1030 .  midodrine (PROAMATINE) tablet 5 mg, 5 mg, Oral, TID WC, Ivor Costa, MD, 5 mg at 03/13/18 1029 .  morphine 2 MG/ML injection 1 mg, 1 mg, Intravenous, Q3H PRN, Ivor Costa, MD, 1 mg at 03/11/18 0308 .  ondansetron (ZOFRAN) injection 4 mg, 4 mg, Intravenous, Q8H PRN, Ivor Costa, MD, 4 mg at 03/12/18 0555 .  pantoprazole (PROTONIX) EC tablet 40 mg, 40 mg, Oral, Daily, Ivor Costa, MD, 40 mg at 03/13/18 1038 .  sodium chloride (OCEAN) 0.65 % nasal spray 1 spray, 1 spray,  Each Nare, PRN, Schorr, Rhetta Mura, NP .  spironolactone (ALDACTONE) tablet 25 mg, 25 mg, Oral, BID, Ivor Costa, MD, 25 mg at 03/13/18 1029 .  traMADol (ULTRAM) tablet 50 mg, 50 mg, Oral, Q6H PRN, Ivor Costa, MD, 50 mg at 03/11/18 1806 .  umeclidinium bromide (INCRUSE ELLIPTA) 62.5 MCG/INH 1 puff, 1 puff, Inhalation, Daily, Ivor Costa, MD, 1 puff at 03/13/18 5427 .  zolpidem (AMBIEN) tablet 5 mg, 5 mg, Oral, QHS PRN, Ivor Costa, MD, 5 mg at 03/11/18 0308    Family History  Problem Relation Age of Onset  . Diabetes Brother        deceased  . Heart disease Sister        A Fib  . Heart disease Mother        CHF  . Atrial fibrillation Sister   . Cancer Father  lung cancer   . Cancer Maternal Uncle        unknown type cancer   . Cancer Other        lung cancer   . Colon cancer Neg Hx     Social History   Socioeconomic History  . Marital status: Married    Spouse name: [name]  . Number of children: 5  . Years of education: Not on file  . Highest education level: Not on file  Occupational History  . Occupation: Arboriculturist: UNEMPLOYED  Social Needs  . Financial resource strain: Not on file  . Food insecurity:    Worry: Not on file    Inability: Not on file  . Transportation needs:    Medical: Not on file    Non-medical: Not on file  Tobacco Use  . Smoking status: Former Smoker    Packs/day: 1.00    Years: 50.00    Pack years: 50.00    Types: Cigarettes    Last attempt to quit: 11/05/2014    Years since quitting: 3.3  . Smokeless tobacco: Never Used  . Tobacco comment: Quit April 2016  Substance and Sexual Activity  . Alcohol use: No    Alcohol/week: 0.0 standard drinks  . Drug use: No  . Sexual activity: Not on file  Lifestyle  . Physical activity:    Days per week: Not on file    Minutes per session: Not on file  . Stress: Not on file  Relationships  . Social connections:    Talks on phone: Not on file    Gets together: Not on file     Attends religious service: Not on file    Active member of club or organization: Not on file    Attends meetings of clubs or organizations: Not on file    Relationship status: Not on file  Other Topics Concern  . Not on file  Social History Narrative  . Not on file     Review of Systems: A 12 point ROS discussed and pertinent positives are indicated in the HPI above.  All other systems are negative.  Review of Systems  Vital Signs: BP 131/66 (BP Location: Right Arm)   Pulse (!) 101   Temp 98.5 F (36.9 C) (Oral)   Resp 18   Ht 5' 2"  (1.575 m)   Wt 90.7 kg   SpO2 98%   BMI 36.57 kg/m   Physical Exam  Constitutional: She is oriented to person, place, and time. She appears well-developed. No distress.  HENT:  Head: Normocephalic.  Mouth/Throat: Oropharynx is clear and moist.  Cardiovascular: Normal rate, regular rhythm and normal heart sounds.  Pulmonary/Chest: Effort normal and breath sounds normal. No respiratory distress.  Abdominal: Soft. She exhibits distension. She exhibits no mass. There is no tenderness.  Neurological: She is alert and oriented to person, place, and time.  Skin: Skin is warm and dry.  Diffuse ST edema/anasarca  Psychiatric: She has a normal mood and affect.    Imaging: Dg Chest 2 View  Result Date: 03/10/2018 CLINICAL DATA:  Shortness of breath.  Abdominal pain/distension. EXAM: CHEST - 2 VIEW COMPARISON:  March 07, 2018 FINDINGS: There is a tiny calcified granuloma in the right upper lung. The heart, hila, and mediastinum are normal. No pneumothorax. Mild opacity in the bases, right greater than left is similar. No overt edema. Right-sided rib fractures again noted. IMPRESSION: 1. Mild bibasilar opacities, similar to mildly improved in the interval  may represent atelectasis. Subtle infiltrate is considered less likely. No overt edema. Electronically Signed   By: Dorise Bullion III M.D   On: 03/10/2018 18:55   Dg Chest 2 View  Result Date:  03/07/2018 CLINICAL DATA:  Increased shortness of breath and fluid retention. Ex-smoker. EXAM: CHEST - 2 VIEW COMPARISON:  02/25/2018. FINDINGS: Borderline enlarged cardiac silhouette. Increased prominence of the pulmonary vasculature and interstitial markings with small bilateral pleural effusions, increased. Small calcified granuloma in the right upper lobe. Mild thoracic spine degenerative changes. Right shoulder prosthesis and fixation anchor. Cervical spine fixation hardware. IMPRESSION: Acute congestive heart failure. Electronically Signed   By: Claudie Revering M.D.   On: 03/07/2018 18:27   Dg Chest 2 View  Result Date: 02/25/2018 CLINICAL DATA:  Short of breath on exertion EXAM: CHEST - 2 VIEW COMPARISON:  12/25/2017, 09/27/2017 FINDINGS: Postsurgical changes in the cervical spine. Tiny pleural effusions. No focal consolidation. Borderline to mild cardiomegaly. Aortic atherosclerosis. No pneumothorax. Treated compression deformity of the lower spine. Right shoulder replacement. IMPRESSION: Tiny pleural effusions.  Stable borderline to mild cardiomegaly. Electronically Signed   By: Donavan Foil M.D.   On: 02/25/2018 21:10   Dg Cervical Spine 2 Or 3 Views  Result Date: 02/27/2018 CLINICAL DATA:  Right arm pain and swelling. History of multiple surgeries. EXAM: CERVICAL SPINE - 2-3 VIEW COMPARISON:  Cervical spine radiographs September 25, 2017 FINDINGS: Cervical vertebral bodies intact. Status post C4 through C6 ACDF with arthrodesis. C4 through C6 facet screws. Intact hardware without periprosthetic lucency. Nonsurgically altered disc heights preserved. Lateral masses in alignment. Osteopenia without destructive bony lesions. Prevertebral and paraspinal soft tissue planes are non suspicious. IMPRESSION: 1. Status post C4 through C6 fusion, with arthrodesis, no radiographic findings of hardware failure. 2. No fracture deformity or malalignment. Electronically Signed   By: Elon Alas M.D.   On:  02/27/2018 16:22   Dg Shoulder Right  Result Date: 02/27/2018 CLINICAL DATA:  RIGHT shoulder pain.  History of surgery. EXAM: RIGHT SHOULDER - 2+ VIEW COMPARISON:  RIGHT shoulder radiograph September 03, 2014 FINDINGS: Status post RIGHT shoulder arthroplasty. Stable appearance of hardware, stem approximates the cortex which is unchanged. No periprosthetic lucency. Suture anchor. Chronic humerus deformity, medially directed exostosis. Osteopenia. No fracture deformity or dislocation. No destructive bony lesions. Soft tissue planes are non suspicious. IMPRESSION: 1. No acute fracture deformity or dislocation. 2. Status post RIGHT shoulder arthroplasty with stable appearance of hardware. Electronically Signed   By: Elon Alas M.D.   On: 02/27/2018 16:20   Ir Paracentesis  Result Date: 03/11/2018 INDICATION: Recurrent ascites, history of cirrhosis - paracentesis in ED on 8/2 with 2.5L amber fluid removed. Patient returned today to ED with complaints of 10 lb weight gain since Friday, increased peripheral edema and abdominal distention. Request for diagnostic and therapeutic paracentesis. EXAM: ULTRASOUND GUIDED DIAGNOSTIC AND THERAPEUTIC PARACENTESIS MEDICATIONS: 10 mL 2% lidocaine. COMPLICATIONS: None immediate. PROCEDURE: Informed written consent was obtained from the patient after a discussion of the risks, benefits and alternatives to treatment. A timeout was performed prior to the initiation of the procedure. Initial ultrasound scanning demonstrates a large amount of ascites within the left lower abdominal quadrant. The left lower abdomen was prepped and draped in the usual sterile fashion. 2% lidocaine was used for local anesthesia. Following this, a 19 gauge, 10-cm, Yueh catheter was introduced. An ultrasound image was saved for documentation purposes. The paracentesis was performed. The catheter was removed and a dressing was applied. The patient tolerated the  procedure well without immediate post  procedural complication. FINDINGS: A total of approximately 1.8L of cloudy yellow fluid was removed. Samples were sent to the laboratory as requested by the clinical team. IMPRESSION: Successful ultrasound-guided paracentesis yielding 1.8 liters of peritoneal fluid. Read by Candiss Norse, PA-C Electronically Signed   By: Marybelle Killings M.D.   On: 03/11/2018 12:59   Ir Paracentesis  Result Date: 02/27/2018 INDICATION: Patient with history of cirrhosis, recurrent ascites. Request is made for diagnostic and therapeutic paracentesis. EXAM: ULTRASOUND GUIDED DIAGNOSTIC AND THERAPEUTIC PARACENTESIS MEDICATIONS: 10 mL 2% lidocaine COMPLICATIONS: None immediate. PROCEDURE: Informed written consent was obtained from the patient after a discussion of the risks, benefits and alternatives to treatment. A timeout was performed prior to the initiation of the procedure. Initial ultrasound scanning demonstrates a small amount of ascites within the left lateral abdomen. The left lateral abdomen was prepped and draped in the usual sterile fashion. 2% lidocaine was used for local anesthesia. Following this, a 6 Fr Safe-T-Centesis catheter was introduced. An ultrasound image was saved for documentation purposes. The paracentesis was performed. The catheter was removed and a dressing was applied. The patient tolerated the procedure well without immediate post procedural complication. FINDINGS: A total of approximately 500 mL of clear, yellow fluid was removed. Samples were sent to the laboratory as requested by the clinical team. IMPRESSION: Successful ultrasound-guided diagnostic and therapeutic paracentesis yielding 500 mL of peritoneal fluid. Read by: Brynda Greathouse PA-C Electronically Signed   By: Markus Daft M.D.   On: 02/27/2018 09:58    Labs:  CBC: Recent Labs    03/07/18 1815 03/10/18 1803 03/11/18 0355 03/13/18 0526  WBC 5.3 5.1 5.6 3.9*  HGB 8.2* 8.3* 7.6* 6.5*  HCT 27.8* 27.9* 24.9* 21.2*  PLT 98* 115*  110* 96*    COAGS: Recent Labs    07/18/17 1135 08/27/17 1457  01/20/18 0009 02/25/18 2113 02/28/18 0453 03/11/18 0355  INR 1.10 1.10   < > 1.17 1.14 1.19 1.17  APTT 32 34  --   --   --   --  27   < > = values in this interval not displayed.    BMP: Recent Labs    03/07/18 1815 03/10/18 1803 03/11/18 0355 03/13/18 0526  NA 138 137 138 138  K 5.0 4.7 4.8 3.9  CL 101 101 102 102  CO2 28 28 25 28   GLUCOSE 121* 120* 105* 94  BUN 29* 21 21 22   CALCIUM 8.6* 7.9* 8.0* 8.1*  CREATININE 1.46* 1.45* 1.44* 1.58*  GFRNONAA 35* 35* 36* 32*  GFRAA 41* 41* 41* 37*    LIVER FUNCTION TESTS: Recent Labs    02/25/18 1538 02/27/18 0541 03/10/18 1803 03/13/18 0526  BILITOT 1.4* 1.1 1.5* 1.0  AST 27 20 23 20   ALT 20 18 19 16   ALKPHOS 137* 119 116 95  PROT 4.8* 4.1* 4.9* 3.9*  ALBUMIN 2.5* 2.3* 2.6* 2.1*    TUMOR MARKERS: Recent Labs    05/31/17 1257 12/05/17 1519  AFPTM 5.5 3.3    Assessment and Plan: Cirrhosis/hepatic failure with refractory ascites. Last para on 8/6, already with rapid distention of abdomen. Had long discussion with pt regarding placement of tunneled peritoneal catheter for fluid management at home instead of frequent paracentesis. Our main concern is infection, but other risks included catheter dysfunction, need to remove and/or possibly replace. After our discussion, she does wish to proceed with placement of tunneled cath. She is already with palliative care and is progressive towards  Hospice. We will set this up for tomorrow. NPO p MN No thinners. Consent signed by pt.  Thank you for this interesting consult.  I greatly enjoyed meeting [name] and look forward to participating in their care.  A copy of this report was sent to the requesting provider on this date.  Electronically Signed: Ascencion Dike, PA-C 03/13/2018, 2:08 PM   I spent a total of 25 minutes in face to face in clinical consultation, greater than 50% of which was  counseling/coordinating care for peritoneal pleurX

## 2018-03-13 NOTE — Consult Note (Addendum)
Consultation  Referring Provider: Dr. Erlinda Hong     Primary Care Physician:  Unk Pinto, MD Primary Gastroenterologist: Dr. Havery Moros        Reason for Consultation: Anemia, ascites            HPI:   [name] is a 72 y.o. female with a past medical history of COPD (on home O2), hepatic carcinoma status post ablation, hepatic failure (recurrent ascites, recurrent encephalopathy, esophageal varices) admitted on 03/10/2018 with recurrent abdominal ascites status post paracentesis 8/6 1.8 L office sample sent to test for SBP with rapid reaccumulation of fluid.   Consulted today in regards to her anemia and recurrent ascites.    Patient was seen in consult by [place] at last hospital stay 02/26/2018 for ascites.  See that consult for further details.  At that time despite increased Aldactone she continued swelling of abdomen, arms, chest, not in legs and feet, weight 186, was 185 on 7/17, 192 on 6/24.  Had increased dyspnea and exercise intolerance as well as upper extremity tremors, mental slowing.  Also with occasional nausea after taking meds, on chronic oral iron and stools are chronically black/dark.  Patient was compliant with lactulose.  At that time diagnosed with decompensated cirrhosis child's 9-10 (depending on level of ascites found), hepatic encephalopathy with increased asterixis, unable to afford rifaximin, ascites despite increased Aldactone, stable torsemide, anemia which is acute on chronic, GI bleed in 09/2017, found grade 1 esophageal varices and moderate portal gastropathy, beta-blocker at home is bisoprolol.  Patient was transfused 1 unit PRBCs during admission late 01/2018.  Patient had history of Wilder which was ablated 08/2017 with no new lesions on 12/2017 MRI.    03/11/2018 phone note from Dr. Havery Moros discussing that family needed to have a discussion about DNR status and it was thought a referral to palliative care was also appropriate.    03/12/2018 see phone note, Dr. Carlean Purl was  called regarding possible permanent abdominal drain to improve quality of life..  It was thought this is reasonable but would make sure she is on SBP prophylaxis to reduce risk of infection.    Today, patient found with her husband by her side.  They explained that she recently had a paracentesis 03/07/2018 with 2.5 L withdrawn but has again rapidly reaccumulated her fluid over the past 6 days.  This is resulted in generalized abdominal discomfort and increased abdominal distention.  Apparently has been using her medications as prescribed at time of last hospitalization.    Discussed anemia with the patient.  She has been on chronic iron and always has black stool but has not noticed a change in its color or texture or smell.  Denies seeing any bright red blood.  No new GI symptoms.    Denies fever or chills.  Past Medical History:  Diagnosis Date  . Asthma   . Cancer [facility])    liver cancer   . COPD (chronic obstructive pulmonary disease) ([place])    x 3 years   . Depression   . Diabetes mellitus without complication ([place])    diet controlled  not on meds   . Edema    left leg   . Esophageal varices ([place])   . Fibromyalgia   . Gastritis   . GERD (gastroesophageal reflux disease)   . Hepatic cirrhosis ([place])   . Hepatic encephalopathy ([place])   . History of blood transfusion   . Hyperlipidemia   . Hypertension   . IBS (irritable  bowel syndrome)   . Phlebitis    left leg x 2   . Pneumonia    hx of   . Splenomegaly   . Vitamin D deficiency     Past Surgical History:  Procedure Laterality Date  . ABDOMINAL HYSTERECTOMY    . BACK SURGERY    . CATARACT EXTRACTION, BILATERAL    . CHOLECYSTECTOMY    . ESOPHAGOGASTRODUODENOSCOPY  07/16/2012   Procedure: ESOPHAGOGASTRODUODENOSCOPY (EGD);  Surgeon: Inda Castle, MD;  Location: [facility];  Service: Endoscopy;  Laterality: N/A;  . ESOPHAGOGASTRODUODENOSCOPY N/A 01/20/2018   Procedure: ESOPHAGOGASTRODUODENOSCOPY (EGD);  Surgeon: Ladene Artist, MD;  Location: [facility] ENDOSCOPY;  Service: Endoscopy;  Laterality: N/A;  . GASTRIC VARICES BANDING  07/16/2012   Procedure: GASTRIC VARICES BANDING;  Surgeon: Inda Castle, MD;  Location: [facility] ENDOSCOPY;  Service: Endoscopy;  Laterality: N/A;  . HIP ARTHROPLASTY Bilateral   . IR GENERIC HISTORICAL  06/25/2016   IR RADIOLOGIST EVAL & MGMT 06/25/2016 [facility]  . IR GENERIC HISTORICAL  06/27/2016   IR KYPHO LUMBAR INC FX REDUCE BONE BX UNI/BIL CANNULATION INC/IMAGING 06/27/2016 Luanne Bras, MD [facility]INTER[facility]  . IR GENERIC HISTORICAL  07/17/2016   IR RADIOLOGIST EVAL & MGMT 07/17/2016 [facility]  . IR PARACENTESIS  01/20/2018  . IR PARACENTESIS  02/27/2018  . IR PARACENTESIS  03/11/2018  . IR RADIOLOGIST EVAL & MGMT  07/10/2017  . IR RADIOLOGIST EVAL & MGMT  10/01/2017  . IR RADIOLOGIST EVAL & MGMT  12/11/2017  . IR THORACENTESIS ASP PLEURAL SPACE W/IMG GUIDE  09/12/2017  . IR THORACENTESIS ASP PLEURAL SPACE W/IMG GUIDE  09/17/2017  . NECK SURGERY    . RADIOFREQUENCY ABLATION N/A 08/30/2017   Procedure: CT MICROWAVE THERMAL ABLATION;  Surgeon: Arne Cleveland, MD;  Location: [facility] ORS;  Service: Anesthesiology;  Laterality: N/A;  . SHOULDER SURGERY Right     Family History  Problem Relation Age of Onset  . Diabetes Brother        deceased  . Heart disease Sister        A Fib  . Heart disease Mother        CHF  . Atrial fibrillation Sister   . Cancer Father        lung cancer   . Cancer Maternal Uncle        unknown type cancer   . Cancer Other        lung cancer   . Colon cancer Neg Hx      Social History   Tobacco Use  . Smoking status: Former Smoker    Packs/day: 1.00    Years: 50.00    Pack years: 50.00    Types: Cigarettes    Last attempt to quit: 11/05/2014    Years since quitting: 3.3  . Smokeless tobacco: Never Used  . Tobacco comment: Quit April 2016  Substance Use Topics  . Alcohol use: No    Alcohol/week: 0.0 standard drinks  . Drug use: No     Prior to Admission medications   Medication Sig Start Date End Date Taking? Authorizing Provider  albuterol (PROVENTIL HFA;VENTOLIN HFA) 108 (90 Base) MCG/ACT inhaler Inhale 2 puffs into the lungs every 6 (six) hours as needed for wheezing or shortness of breath. 05/27/16  Yes Ward, Delice Bison, DO  allopurinol (ZYLOPRIM) 300 MG tablet take 1 tablet (300 mg) by mouth daily with supper - for gout 03/04/18  Yes Unk Pinto, MD  ALPRAZolam Duanne Moron) 0.25 MG tablet  Take 1 tab as needed for anxiety, cramping, air hunger every 8 hours as needed. 11/01/17  Yes Liane Comber, NP  aspirin EC 81 MG tablet Take 81 mg by mouth at bedtime.   Yes [provider]  Cholecalciferol (VITAMIN D3) 5000 units CAPS Take 5,000 Units by mouth daily with breakfast.   Yes [provider]  citalopram (CELEXA) 40 MG tablet Take 1 tablet (40 mg total) by mouth daily. 03/01/18  Yes Florencia Reasons, MD  cyclobenzaprine (FLEXERIL) 10 MG tablet Take 1 tablet (10 mg total) by mouth 3 (three) times daily as needed for muscle spasms. 03/06/16  Yes Unk Pinto, MD  ferrous sulfate 325 (65 FE) MG tablet Take 1 tablet (325 mg total) by mouth daily with breakfast. 03/01/18  Yes Florencia Reasons, MD  gabapentin (NEURONTIN) 600 MG tablet Take 600 mg by mouth 3 (three) times daily.    Yes [provider]  Glycopyrrolate-Formoterol (BEVESPI AEROSPHERE) 9-4.8 MCG/ACT AERO Inhale 2 puffs into the lungs 2 (two) times daily. 12/25/17  Yes Tanda Rockers, MD  ipratropium (ATROVENT) 0.02 % nebulizer solution Take 3 mLs by nebulization every 4 (four) hours as needed for wheezing or shortness of breath. 01/29/18  Yes [provider]  lactulose (CHRONULAC) 10 GM/15ML solution take 60ms TWICE DAILY Patient taking differently: Take 20 g by mouth daily.  08/08/17  Yes MUnk Pinto MD  Magnesium 250 MG TABS Take 250 mg by mouth at bedtime.    Yes [provider]  metolazone (ZAROXOLYN) 5 MG tablet Take 1 tablet  daily or as directed for fluid Overload Patient taking differently: Take 10 mg by mouth daily.  02/20/18  Yes MUnk Pinto MD  midodrine (PROAMATINE) 5 MG tablet Take 1 tablet (5 mg total) by mouth 3 (three) times daily with meals. 03/01/18  Yes XFlorencia Reasons MD  ondansetron (ZOFRAN) 4 MG tablet Take 1 tablet (4 mg total) by mouth every 6 (six) hours as needed for nausea or vomiting. 09/02/17  Yes CVicie Mutters PA-C  OXYGEN Inhale 2-3 L into the lungs continuous. 3 L with exertion   Yes [provider]  pantoprazole (PROTONIX) 40 MG tablet Take 1 tablet (40 mg total) by mouth daily. 03/04/18  Yes MUnk Pinto MD  potassium chloride (K-DUR) 10 MEQ tablet Take 1 tablet (10 mEq total) by mouth daily. TAKE 2 TABLETS BY MOUTH 3 TIMES A DAY. Patient taking differently: Take 20 mEq by mouth 3 (three) times daily.  03/01/18  Yes XFlorencia Reasons MD  rifampin (RIFADIN) 300 MG capsule Take 1 capsule by mouth 2 (two) times daily.   Yes [provider]  spironolactone (ALDACTONE) 25 MG tablet Take 1 tablet (25 mg total) by mouth 2 (two) times daily. 03/01/18  Yes XFlorencia Reasons MD  traMADol (Veatrice Bourbon 50 MG tablet Take 50 mg by mouth every 12 hours as needed for pain for chronic pain 03/03/18  Yes CLiane Comber NP  vitamin C (ASCORBIC ACID) 500 MG tablet Take 500 mg by mouth 3 (three) times daily.   Yes [provider]    Current Facility-Administered Medications  Medication Dose Route Frequency Provider Last Rate Last Dose  . 0.9 %  sodium chloride infusion (Manually program via Guardrails IV Fluids)   Intravenous Once XFlorencia Reasons MD      . albuterol (PROVENTIL) (2.5 MG/3ML) 0.083% nebulizer solution 2.5 mg  2.5 mg Nebulization Q4H PRN NIvor Costa MD   2.5 mg at 03/12/18 0604  . allopurinol (ZYLOPRIM) tablet 300 mg  300 mg Oral Daily Ivor Costa, MD   300 mg at 03/12/18 0858  . ALPRAZolam Duanne Moron) tablet 0.25 mg  0.25 mg Oral TID PRN Ivor Costa, MD   0.25 mg at 03/13/18 0145  . arformoterol  (BROVANA) nebulizer solution 15 mcg  15 mcg Nebulization BID Ivor Costa, MD   15 mcg at 03/13/18 7673  . aspirin EC tablet 81 mg  81 mg Oral QHS Ivor Costa, MD   81 mg at 03/12/18 2254  . cefTRIAXone (ROCEPHIN) 1 g in sodium chloride 0.9 % 100 mL IVPB  1 g Intravenous QHS Ivor Costa, MD 200 mL/hr at 03/12/18 2300 1 g at 03/12/18 2300  . cholecalciferol (VITAMIN D) tablet 5,000 Units  5,000 Units Oral Q breakfast Ivor Costa, MD   5,000 Units at 03/12/18 0857  . citalopram (CELEXA) tablet 40 mg  40 mg Oral Daily Ivor Costa, MD   40 mg at 03/12/18 0857  . cyclobenzaprine (FLEXERIL) tablet 10 mg  10 mg Oral TID PRN Ivor Costa, MD   10 mg at 03/12/18 2259  . ferrous sulfate tablet 325 mg  325 mg Oral Q breakfast Ivor Costa, MD   325 mg at 03/12/18 0858  . gabapentin (NEURONTIN) tablet 600 mg  600 mg Oral TID Ivor Costa, MD   600 mg at 03/12/18 2254  . hydrALAZINE (APRESOLINE) injection 5 mg  5 mg Intravenous Q2H PRN Ivor Costa, MD      . ipratropium-albuterol (DUONEB) 0.5-2.5 (3) MG/3ML nebulizer solution 3 mL  3 mL Nebulization TID Arrien, Jimmy Picket, MD   3 mL at 03/13/18 4193  . lactulose (CHRONULAC) 10 GM/15ML solution 20 g  20 g Oral Daily Ivor Costa, MD   20 g at 03/12/18 0857  . magnesium oxide (MAG-OX) tablet 400 mg  400 mg Oral QHS Ivor Costa, MD   400 mg at 03/12/18 2254  . metolazone (ZAROXOLYN) tablet 10 mg  10 mg Oral Daily Ivor Costa, MD   10 mg at 03/12/18 0901  . midodrine (PROAMATINE) tablet 5 mg  5 mg Oral TID WC Ivor Costa, MD   5 mg at 03/12/18 1842  . morphine 2 MG/ML injection 1 mg  1 mg Intravenous Q3H PRN Ivor Costa, MD   1 mg at 03/11/18 0308  . ondansetron (ZOFRAN) injection 4 mg  4 mg Intravenous Q8H PRN Ivor Costa, MD   4 mg at 03/12/18 0555  . pantoprazole (PROTONIX) EC tablet 40 mg  40 mg Oral Daily Ivor Costa, MD   40 mg at 03/12/18 0857  . sodium chloride (OCEAN) 0.65 % nasal spray 1 spray  1 spray Each Nare PRN Schorr, Rhetta Mura, NP      . spironolactone  (ALDACTONE) tablet 25 mg  25 mg Oral BID Ivor Costa, MD   25 mg at 03/12/18 1842  . traMADol (ULTRAM) tablet 50 mg  50 mg Oral Q6H PRN Ivor Costa, MD   50 mg at 03/11/18 1806  . umeclidinium bromide (INCRUSE ELLIPTA) 62.5 MCG/INH 1 puff  1 puff Inhalation Daily Ivor Costa, MD   1 puff at 03/13/18 0823  . zolpidem (AMBIEN) tablet 5 mg  5 mg Oral QHS PRN Ivor Costa, MD   5 mg at 03/11/18 0308    Allergies as of 03/10/2018 - Review Complete 03/10/2018  Allergen Reaction Noted  . Atorvastatin Other (See Comments)   . Diphenhydramine hcl (sleep) Hives 07/22/2015  . Hydrocodone-acetaminophen Other (See Comments) 07/22/2015  . Lopid [gemfibrozil] Other (See Comments)  07/22/2015  . Loratadine Hives 07/22/2015  . Lorazepam Hives 06/10/2008  . Simvastatin Other (See Comments)   . Sulfamethoxazole Hives 07/22/2015  . Sulfonamide derivatives Hives      Review of Systems:    Constitutional: No weight loss, fever or chills Skin: No rash  Cardiovascular: No chest pain Respiratory:+DOE Gastrointestinal: See HPI and otherwise negative Genitourinary: No dysuria Neurological: No headache Musculoskeletal: No new muscle or joint pain Hematologic: No bleeding  Psychiatric: No history of depression or anxiety    Physical Exam:  Vital signs in last 24 hours: Temp:  [97.6 F (36.4 C)-98.3 F (36.8 C)] 97.6 F (36.4 C) (08/08 0759) Pulse Rate:  [88-103] 96 (08/08 0759) Resp:  [18-20] 18 (08/08 0759) BP: (122-145)/(50-88) 140/50 (08/08 0759) SpO2:  [97 %-100 %] 99 % (08/08 0829) Last BM Date: 03/11/18 General:   Pleasant ill-appearing Caucasian female appears to be in NAD, Well developed, Well nourished, alert and cooperative Head:  Normocephalic and atraumatic. Eyes:   PEERL, EOMI. No icterus. Conjunctiva pink Ears:  Normal auditory acuity. Neck:  Supple Throat: Oral cavity and pharynx without inflammation, swelling or lesion.  Lungs: Respirations even and unlabored. Lungs clear to  auscultation bilaterally.   No wheezes, crackles, or rhonchi.  Heart: Normal S1, S2. No MRG. Regular rate and rhythm. No peripheral edema, cyanosis or pallor.  Abdomen:  Tense, +fluid wave, Moderate distension, +clean bandage on left side, nontender. No rebound or guarding. Normal bowel sounds. No appreciable masses or hepatomegaly. Rectal:  Not performed.  Msk:  Symmetrical without gross deformities. Peripheral pulses intact.  Extremities:  Without edema, no deformity or joint abnormality.  Neurologic:  Alert and  oriented x4;  grossly normal neurologically. Skin:   Dry and intact without significant lesions or rashes. Psychiatric: Demonstrates good judgement and reason without abnormal affect or behaviors.   LAB RESULTS: Recent Labs    03/10/18 1803 03/11/18 0355 03/13/18 0526  WBC 5.1 5.6 3.9*  HGB 8.3* 7.6* 6.5*  HCT 27.9* 24.9* 21.2*  PLT 115* 110* 96*   BMET Recent Labs    03/10/18 1803 03/11/18 0355 03/13/18 0526  NA 137 138 138  K 4.7 4.8 3.9  CL 101 102 102  CO2 28 25 28   GLUCOSE 120* 105* 94  BUN 21 21 22   CREATININE 1.45* 1.44* 1.58*  CALCIUM 7.9* 8.0* 8.1*   LFT Recent Labs    03/13/18 0526  PROT 3.9*  ALBUMIN 2.1*  AST 20  ALT 16  ALKPHOS 95  BILITOT 1.0   PT/INR Recent Labs    03/11/18 0355  LABPROT 14.8  INR 1.17    STUDIES: Ir Paracentesis  Result Date: 03/11/2018 INDICATION: Recurrent ascites, history of cirrhosis - paracentesis in ED on 8/2 with 2.5L amber fluid removed. Patient returned today to ED with complaints of 10 lb weight gain since Friday, increased peripheral edema and abdominal distention. Request for diagnostic and therapeutic paracentesis. EXAM: ULTRASOUND GUIDED DIAGNOSTIC AND THERAPEUTIC PARACENTESIS MEDICATIONS: 10 mL 2% lidocaine. COMPLICATIONS: None immediate. PROCEDURE: Informed written consent was obtained from the patient after a discussion of the risks, benefits and alternatives to treatment. A timeout was performed  prior to the initiation of the procedure. Initial ultrasound scanning demonstrates a large amount of ascites within the left lower abdominal quadrant. The left lower abdomen was prepped and draped in the usual sterile fashion. 2% lidocaine was used for local anesthesia. Following this, a 19 gauge, 10-cm, Yueh catheter was introduced. An ultrasound image was saved for documentation purposes.  The paracentesis was performed. The catheter was removed and a dressing was applied. The patient tolerated the procedure well without immediate post procedural complication. FINDINGS: A total of approximately 1.8L of cloudy yellow fluid was removed. Samples were sent to the laboratory as requested by the clinical team. IMPRESSION: Successful ultrasound-guided paracentesis yielding 1.8 liters of peritoneal fluid. Read by Candiss Norse, PA-C Electronically Signed   By: Marybelle Killings M.D.   On: 03/11/2018 12:59     Impression / Plan:   Impression: 1.  Decompensated cirrhosis: With history of hepatic encephalopathy, stable on lactulose, cannot afford rifaximin, and recurrent ascites 2.  Ascites: Recurrent ascites despite maximizing Aldactone and torsemide, patient with multiple repeat paracenteses and quick reaccumulation of fluid, agree with IR consultation for intra-abdominal catheter 3.  Acute on chronic anemia: Hemoglobin 8.3 (03/11/2018)--> 7.6 (03/12/2018)--> 6.5 (03/13/2018)--> receiving 1 unit PRBCs; recent EGD 01/20/2018 with portal gastropathy 4.  History of HCC: Ablated 08/2017 with no new lesions on 12/2017 MRI 5.  Thrombocytopenia: plts 96 6.  CKD stage III 7.  Chronic diastolic CHF  Plan: 1.  Appreciate palliative care's recommendations-patient is a DNR but is not ready for hospice yet 2.  Agree with consult to IR for intra-abdominal catheter-we will need to discuss ongoing SBP prophylaxis to reduce risk of infection and if this is placed-->it appears order has already been placed by [facility] 3.  Continue  supportive measures including monitoring of hemoglobin transfusion less than 7 4.  Continue iron 5.  Continue outpatient medications 6.  Please await further recommendations from Dr. Lyndel Safe later today  Thank you for your kind consultation, we will continue to follow.  Lavone Nian [facility]  03/13/2018, 10:22 AM   Attending physician's note   I have taken an interval history, reviewed the chart and examined the patient. I agree with the Advanced Practitioner's note, impression and recommendations.   72 year old with end-stage decompensated liver cirrhosis, refractory ascites despite of maximizing diuretics, it is post-multiple paracenteses. Not a candidate for TIPS. Has recurrent anemia due to portal hypertensive gastropathy, CKD, CHF. Plan: palliative consultation, IR consultation for insertion of peritoneal catheter fpr palliative reasons. Trend CBC  Carmell Austria, MD

## 2018-03-13 NOTE — Care Management Note (Signed)
Case Management Note  Patient Details  Name: [name] MRN: [id number] Date of Birth: 12/25/1945  Subjective/Objective:     Pt admitted with abdominal pain. She is from home with spouse.                Action/Plan: CM consulted for palliative care at home. Patient has used [facility] in the past for her home palliative care. CM called and notified [facility] of referral. They will reach out to her PCP at discharge for the orders they need to follow pt at home.  CM following for further d/c needs.   Expected Discharge Date:                  Expected Discharge Plan:     In-House Referral:     Discharge planning Services  CM Consult, Other - See comment(palliative care at home)  Post Acute Care Choice:    Choice offered to:     DME Arranged:    DME Agency:     HH Arranged:    HH Agency:     Status of Service:  In process, will continue to follow  If discussed at Long Length of Stay Meetings, dates discussed:    Additional Comments:  Pollie Friar, RN 03/13/2018, 10:13 AM

## 2018-03-13 NOTE — Progress Notes (Signed)
CRITICAL VALUE ALERT  Critical Value:  6.5  Date & Time Notied:  03/13/2018 & 7619  Provider Notified: Dillard Cannon  Orders Received/Actions taken: nurse will monitor , Waiting to hear from MD

## 2018-03-14 ENCOUNTER — Encounter (HOSPITAL_COMMUNITY): Payer: Self-pay | Admitting: Interventional Radiology

## 2018-03-14 ENCOUNTER — Inpatient Hospital Stay (HOSPITAL_COMMUNITY): Payer: PPO

## 2018-03-14 ENCOUNTER — Other Ambulatory Visit (INDEPENDENT_AMBULATORY_CARE_PROVIDER_SITE_OTHER): Payer: PPO

## 2018-03-14 DIAGNOSIS — D649 Anemia, unspecified: Secondary | ICD-10-CM | POA: Diagnosis not present

## 2018-03-14 DIAGNOSIS — Z1211 Encounter for screening for malignant neoplasm of colon: Secondary | ICD-10-CM

## 2018-03-14 DIAGNOSIS — D508 Other iron deficiency anemias: Secondary | ICD-10-CM

## 2018-03-14 DIAGNOSIS — J449 Chronic obstructive pulmonary disease, unspecified: Secondary | ICD-10-CM

## 2018-03-14 DIAGNOSIS — R601 Generalized edema: Secondary | ICD-10-CM

## 2018-03-14 HISTORY — PX: IR PERC TUN PERIT CATH WO PORT S&I /IMAG: IMG2327

## 2018-03-14 LAB — COMPREHENSIVE METABOLIC PANEL
ALK PHOS: 85 U/L (ref 38–126)
ALT: 19 U/L (ref 0–44)
ANION GAP: 10 (ref 5–15)
AST: 26 U/L (ref 15–41)
Albumin: 2.3 g/dL — ABNORMAL LOW (ref 3.5–5.0)
BILIRUBIN TOTAL: 1.1 mg/dL (ref 0.3–1.2)
BUN: 26 mg/dL — ABNORMAL HIGH (ref 8–23)
CALCIUM: 8.1 mg/dL — AB (ref 8.9–10.3)
CO2: 26 mmol/L (ref 22–32)
Chloride: 101 mmol/L (ref 98–111)
Creatinine, Ser: 1.59 mg/dL — ABNORMAL HIGH (ref 0.44–1.00)
GFR, EST AFRICAN AMERICAN: 37 mL/min — AB (ref >60)
GFR, EST NON AFRICAN AMERICAN: 32 mL/min — AB (ref >60)
Glucose, Bld: 107 mg/dL — ABNORMAL HIGH (ref 70–99)
POTASSIUM: 4 mmol/L (ref 3.5–5.1)
Sodium: 137 mmol/L (ref 135–145)
TOTAL PROTEIN: 4.3 g/dL — AB (ref 6.5–8.1)

## 2018-03-14 LAB — POC HEMOCCULT BLD/STL (HOME/3-CARD/SCREEN)
Card #2 Fecal Occult Blod, POC: POSITIVE
FECAL OCCULT BLD: POSITIVE
FECAL OCCULT BLD: POSITIVE — AB

## 2018-03-14 LAB — CBC
HEMATOCRIT: 25.3 % — AB (ref 36.0–46.0)
HEMOGLOBIN: 7.9 g/dL — AB (ref 12.0–15.0)
MCH: 33.3 pg (ref 26.0–34.0)
MCHC: 31.2 g/dL (ref 30.0–36.0)
MCV: 106.8 fL — AB (ref 78.0–100.0)
Platelets: 99 10*3/uL — ABNORMAL LOW (ref 150–400)
RBC: 2.37 MIL/uL — ABNORMAL LOW (ref 3.87–5.11)
RDW: 17.7 % — AB (ref 11.5–15.5)
WBC: 4.9 10*3/uL (ref 4.0–10.5)

## 2018-03-14 LAB — BPAM RBC
Blood Product Expiration Date: 201908302359
ISSUE DATE / TIME: 201908081045
UNIT TYPE AND RH: 7300

## 2018-03-14 LAB — TYPE AND SCREEN
ABO/RH(D): B POS
Antibody Screen: NEGATIVE
UNIT DIVISION: 0

## 2018-03-14 LAB — AMMONIA: Ammonia: 71 umol/L — ABNORMAL HIGH (ref 9–35)

## 2018-03-14 MED ORDER — LIDOCAINE HCL (PF) 1 % IJ SOLN
INTRAMUSCULAR | Status: AC | PRN
  Administered 2018-03-14: 5 mL

## 2018-03-14 MED ORDER — GABAPENTIN 600 MG PO TABS: 600.0000 mg | ORAL_TABLET | Freq: Every day | ORAL | Status: DC

## 2018-03-14 MED ORDER — CITALOPRAM HYDROBROMIDE 10 MG PO TABS: 20.0000 mg | ORAL_TABLET | Freq: Every day | ORAL | Status: DC

## 2018-03-14 MED ORDER — CIPROFLOXACIN HCL 500 MG PO TABS: 500.0000 mg | ORAL_TABLET | ORAL | 0 refills | Status: AC

## 2018-03-14 MED ORDER — GABAPENTIN 600 MG PO TABS: 600.0000 mg | ORAL_TABLET | Freq: Every day | ORAL | 0 refills | Status: DC

## 2018-03-14 MED ORDER — POTASSIUM CHLORIDE ER 10 MEQ PO TBCR: 10.0000 meq | EXTENDED_RELEASE_TABLET | ORAL | 0 refills | Status: DC

## 2018-03-14 MED ORDER — CITALOPRAM HYDROBROMIDE 20 MG PO TABS: 20.0000 mg | ORAL_TABLET | Freq: Every day | ORAL | 2 refills | Status: DC

## 2018-03-14 MED ORDER — CEFAZOLIN SODIUM-DEXTROSE 2-4 GM/100ML-% IV SOLN
2.0000 g | Freq: Once | INTRAVENOUS | Status: AC
  Administered 2018-03-14: 2 g via INTRAVENOUS
  Filled 2018-03-14: qty 100

## 2018-03-14 MED ORDER — FENTANYL CITRATE (PF) 100 MCG/2ML IJ SOLN
INTRAMUSCULAR | Status: AC
  Filled 2018-03-14: qty 4

## 2018-03-14 MED ORDER — MIDAZOLAM HCL 2 MG/2ML IJ SOLN
INTRAMUSCULAR | Status: AC
  Filled 2018-03-14: qty 4

## 2018-03-14 MED ORDER — FENTANYL CITRATE (PF) 100 MCG/2ML IJ SOLN
INTRAMUSCULAR | Status: AC | PRN
  Administered 2018-03-14: 50 ug via INTRAVENOUS

## 2018-03-14 MED ORDER — SODIUM CHLORIDE 0.9% FLUSH: 3.0000 mL | INTRAVENOUS | 0 refills | Status: AC | PRN

## 2018-03-14 MED ORDER — MIDAZOLAM HCL 2 MG/2ML IJ SOLN
INTRAMUSCULAR | Status: AC | PRN
  Administered 2018-03-14: 1 mg via INTRAVENOUS
  Administered 2018-03-14: 0.5 mg via INTRAVENOUS

## 2018-03-14 MED ORDER — CEFAZOLIN SODIUM-DEXTROSE 2-4 GM/100ML-% IV SOLN
INTRAVENOUS | Status: AC
  Filled 2018-03-14: qty 100

## 2018-03-14 NOTE — Procedures (Addendum)
Interventional Radiology Procedure Note  Procedure: Tunneled peritoneal PleurX catheter placement  Complications: None  Estimated Blood Loss: < 10 mL  Findings: 16 Fr PleurX catheter placed from RLQ approach.  Paracentesis now being performed.   Venetia Night. Kathlene Cote, M.D Pager:  [phone]

## 2018-03-14 NOTE — Progress Notes (Addendum)
[facility] Progress Note   Chief Complaint:   End stage cirrhosis with recurrent anemia and recurrent ascites    SUBJECTIVE:    leaking from paracentesis site. No abdominal pain .    ASSESSMENT AND PLAN:   1. 72 yo female with CKD, chronic diastolic heart failure, COPD admitted with decompensated cirrhosis. Ascites refractory to diuretics, requiring frequent therapeutic LVPs, not TIPS candidate. She has end stage disease with recurrent anemia due to portal hypertensive gastropathy. She has end stage disease, has met with Palliative Care and is DNR progressing toward Hospice.  -Patient had interest in tunneled peritoneal cath placement to avoid repeated hospital visits for ascites. IR discussed with her and this is scheduled for today.  -no organisms on gram stain, culture pending. I didn't a see cell count.   -Tunneled catheters carry some risks of infection but couldn't find much written about antibiotics for prophylaxis but she is really trying to stay out of the hospital going forward so will start cipro  (sulfa allergy). Recommend cipro 545m M,W, F(renal insufficiency).    2. Acute on chronic anemia. Hgb 6.5, up to 7.9 post one unit of blood yesterday    Attending physician's note   I have taken an interval history, reviewed the chart and examined the patient. I agree with the Advanced Practitioner's note, impression and recommendations.   72year old with CKD, D CHF, end-stage decompensated liver cirrhosis with refractory ascites s/p tunneled peritoneal catheter placement today. Not a candidate for TIPS. Has recurrent anemia due to portal hypertensive gastropathy, CKD, CHF and is transfusion dependent. Plan: Home health care nurse to perform ascitic fluid aspiration once a week/more frequently if needed in the future, monitor CBC periodically and transfuse as needed, Cipro 500 mg 3 times a week due to CRI for SBP prophylaxis.  Currently on palliative care and is  DNR progressing towards hospice.  RCarmell Austria MD   OBJECTIVE:     Vital signs in last 24 hours: Temp:  [97.7 F (36.5 C)-99.2 F (37.3 C)] 98.1 F (36.7 C) (08/09 0731) Pulse Rate:  [97-105] 103 (08/09 0731) Resp:  [18-20] 18 (08/09 0731) BP: (124-149)/(52-99) 124/99 (08/09 0731) SpO2:  [91 %-99 %] 97 % (08/09 0747) Last BM Date: 03/13/18 General:   Alert, female in NAD Heart:  Slightly sinus tachycardia, +murmur, mild BLE edema Pulm: breathing slightly labored, on 02. . Abdomen:  Soft, distended, nontender.  Normal bowel soundst.     Neurologic:  Alert and  oriented x4;  grossly normal neurologically. Psych:  Pleasant, cooperative.  Normal mood and affect.   Intake/Output from previous day: 08/08 0701 - 08/09 0700 In: 310 [Blood:310] Out: 400 [Urine:400] Intake/Output this shift: No intake/output data recorded.  Lab Results: Recent Labs    03/13/18 0526 03/14/18 0406  WBC 3.9* 4.9  HGB 6.5* 7.9*  HCT 21.2* 25.3*  PLT 96* 99*   BMET Recent Labs    03/13/18 0526 03/14/18 0406  NA 138 137  K 3.9 4.0  CL 102 101  CO2 28 26  GLUCOSE 94 107*  BUN 22 26*  CREATININE 1.58* 1.59*  CALCIUM 8.1* 8.1*   LFT Recent Labs    03/14/18 0406  PROT 4.3*  ALBUMIN 2.3*  AST 26  ALT 19  ALKPHOS 85  BILITOT 1.1   Principal Problem:   Abdominal pain Active Problems:   Essential hypertension   GERD   Thrombocytopenia (HCC)   COPD GOLD IV/ 02 dep    Anasarca  Ascites   Liver cirrhosis secondary to nonalcoholic steatohepatitis (NASH) (HCC)   Chronic diastolic CHF (congestive heart failure) (HCC)   CKD (chronic kidney disease), stage III (HCC)   Iron deficiency anemia due to chronic blood loss   Advanced care planning/counseling discussion   Palliative care by specialist     LOS: 3 days   Tye Savoy ,NP 03/14/2018, 11:53 AM

## 2018-03-14 NOTE — Care Management Note (Signed)
Case Management Note  Patient Details  Name: [name] MRN: [id number] Date of Birth: [birth year]  Subjective/Objective:     Pt admitted with abdominal pain. She is from home with spouse and is active with [facility] for [facility] services. She is oxygen dependent at home on 2.5 L through [facility].                Action/Plan: Pt discharging home with new peritoneal drain. CM called Malachy Mood with [facility] and informed her of d/c and resumption of services and about the new drain. She states they will have an RN see her over the weekend.  CM spoke to patients husband and he is bringing a tank for transport home for the patient.  Flushes prescription sent to her pharmacy.   Expected Discharge Date:  03/14/18               Expected Discharge Plan:  [place]  In-House Referral:     Discharge planning Services  CM Consult, Other - See comment(palliative care at home)  Post Acute Care Choice:  Home Health Choice offered to:     DME Arranged:    DME Agency:     HH Arranged:  PT, RN HH Agency:  [facility]  Status of Service:  Completed, signed off  If discussed at [place] of Stay Meetings, dates discussed:    Additional Comments:  Pollie Friar, RN 03/14/2018, 4:35 PM

## 2018-03-14 NOTE — Discharge Summary (Addendum)
Discharge Summary  [name]:[id number] DOB: [birth year]  PCP: Unk Pinto, MD  Admit date: 03/10/2018 Discharge date: 03/14/2018  Time spent: 32mns, more than 50% time spent on coordination of care  Recommendations for Outpatient Follow-up:  1. F/u with PMD within a week  for hospital discharge follow up, repeat cbc/bmp at follow up. 2. F/u with [facility] for cirrhosis, cbc check and outpatient blood transfusion if needed 3. Home health PT and RN for drain care 4. Continue home 02  Discharge Diagnoses:  Active Hospital Problems   Diagnosis Date Noted  . Abdominal pain 03/11/2018  . Advanced care planning/counseling discussion   . Palliative care by specialist   . Iron deficiency anemia due to chronic blood loss   . Chronic diastolic CHF (congestive heart failure) ([place]   . CKD (chronic kidney disease), stage III ([facility]   . Liver cirrhosis secondary to nonalcoholic steatohepatitis (NASH) ([facility]   . Ascites 09/07/2017  . Anasarca 12/14/2016  . COPD GOLD IV/ 02 dep  08/30/2015  . Thrombocytopenia ([facility] 08/07/2015  . Essential hypertension   . GERD     Resolved Hospital Problems  No resolved problems to display.    Discharge Condition: stable  Diet recommendation: low salt diet  Filed Weights   03/10/18 1757 03/11/18 0146  Weight: 88.9 kg 90.7 kg    History of present illness: (per admitting MD Dr NBlaine Hamper PCP: MUnk Pinto MD   Patient coming from:  The patient is coming from home.  At baseline, pt is independent for most of ADL.  Chief Complaint: Abdominal distention, abdominal pain,  HPI: Kerry AYDINis a 72y.o. female with medical history significant [facility], esophageal varices, hypertension, hyperlipidemia, diabetes mellitus, COPD, asthma, GERD, gout, depression with anxiety, hepatic encephalopathy, iron deficiency anemia, CKD-3, dCHF, who presents with abdominal distention and abdominal pain.  Patient states  that she had paracentesis on Friday, and had 2.5 L of fluid removed in the ED, but her abdominal ascites has come back quickly.  She had 8 pounds weight gain. Her abdominal distention has worsened. She also has lower abdominal pain, which is constant, 9 out of 10 severity, sharp, nonradiating.  No nausea, vomiting or diarrhea.  No fever or chills.  Patient does not have chest pain, cough, fever or chills.  She has some mild shortness of breath due to abdominal distention.  No symptoms of UTI or unilateral weakness.  ED Course: pt was found to have WBC 5.1, stable renal function, temperature normal, no tachycardia, no tachypnea, oxygen saturation 100% on room air.  Chest x-ray showed mild bilateral basilar opacity.  Patient is admitted to telemetry bed as inpatient.  Hospital Course:  Principal Problem:   Abdominal pain Active Problems:   Essential hypertension   GERD   Thrombocytopenia (HCC)   COPD GOLD IV/ 02 dep    Anasarca   Ascites   Liver cirrhosis secondary to nonalcoholic steatohepatitis (NASH) (HCC)   Chronic diastolic CHF (congestive heart failure) (HCC)   CKD (chronic kidney disease), stage III (HCC)   Iron deficiency anemia due to chronic blood loss   Advanced care planning/counseling discussion   Palliative care by specialist   Decompensated cirrhosis with Recurrent ascites: -H/o grade 1 esophageal varices and moderate portal gastropathy -she was feeling better after discharge, but per patient,  ascites reoccured in just a few days after discharge and continue to get worse -she denies ab pain, she has another paracentesis done  On  8/6 with 1.8 liter removed, gram stain negative for bacteremia -she has refractory ascites, not responding to/not able to tolerate diuretics, she is recently started on midodrine -patient wants to have a drain placed in to avoid rehospitalization.   -case discussed with GI Dr Carlean Purl who think "She has end stage liver disease and refractory ascites  so a permanent abdominal drain to improve quality of life is a reasonable option"  "If she gets one I would make sure she takes SBP prohylaxis to reduce risk of infection." -s/p  palliative catheter placement  On 8/9. She is discharged home with home health and cipro 563m MWF per gi recommendation.   Acute on chronic anemia, macrocytic, from chronic GI blood loss With h/o gi bleed in 09/2017. H/o grade 1 esophageal varices and moderate portal gastropathy Continue iron supplement  transfuse prbcx1 units  On 8/8 to keep hgb >7. GI to monitor cbc and arrange outpatient blood transfusion if needed to avoid rehospitalization, this is discussed with GI Dr GLyndel Safeand PA Ms GChester Holstein Thrombocytopenia: likely from cirrhosis Stable   Hepatic encephalopathy: stable  oriented x3, continue lactulose, not able to afford rifaximin   H/o Stage 2 HCC, s/p IR ablation x 2 (lesions in left and right lobes) in 08/2017 MRI 12/2017 with ablation related defects, no new lesions.    CKDIII Renal function at baseline  Right upper extremity edema: Intermittent, chronic,  Negative dvt study during recent hospitalization Patient has right shoulder surgery when she was [age] weeks old, right arm hypoplasia    Chronic diastolic chf -volume overloaded , likely due to cirrhosis -refractory to diuretics  FTT:  Palliative care consulted, she is DNR, but she is not ready for hospice yet Follow up with outpatient palliative care.     Code Status: DNR  Family Communication: patient and sister , husband  Disposition Plan: home on 8/9 after tunneled peritoneal drainage catheter placement  With IR and GI clearance for discharge   Consultants:  Gi  IR  Palliative care  Procedures:  Paracentesis on 8/6 and 8/9 Tunneled peritoneal PleurX catheter placement on 8/9  Antibiotics:  rocephin   Discharge Exam: BP (!) 146/48   Pulse 97   Temp 98.3 F (36.8 C) (Oral)    Resp 13   Ht 5' 2"  (1.575 m)   Wt 90.7 kg   SpO2 94%   BMI 36.57 kg/m    General:  Frail but NAD  Cardiovascular: RRR  Respiratory: diminished  Abdomen: distended abdomen, NT, positive BS  Musculoskeletal: + Edema  Neuro: alert, oriented     Discharge Instructions You were cared for by a hospitalist during your hospital stay. If you have any questions about your discharge medications or the care you received while you were in the hospital after you are discharged, you can call the unit and asked to speak with the hospitalist on call if the hospitalist that took care of you is not available. Once you are discharged, your primary care physician will handle any further medical issues. Please note that NO REFILLS for any discharge medications will be authorized once you are discharged, as it is imperative that you return to your primary care physician (or establish a relationship with a primary care physician if you do not have one) for your aftercare needs so that they can reassess your need for medications and monitor your lab values.  Discharge Instructions    Diet - low sodium heart healthy   Complete by:  As directed  Increase activity slowly   Complete by:  As directed      Allergies as of 03/14/2018      Reactions   Atorvastatin Other (See Comments)   Unknown reaction   Diphenhydramine Hcl (sleep) Hives   Hydrocodone-acetaminophen Other (See Comments)   Unknown reaction   Lopid [gemfibrozil] Other (See Comments)   Unknown reaction   Loratadine Hives   Lorazepam Hives   Simvastatin Other (See Comments)   Unknown reaction   Sulfamethoxazole Hives   Sulfonamide Derivatives Hives      Medication List    STOP taking these medications   aspirin EC 81 MG tablet   rifampin 300 MG capsule Commonly known as:  RIFADIN     TAKE these medications   albuterol 108 (90 Base) MCG/ACT inhaler Commonly known as:  PROVENTIL HFA;VENTOLIN HFA Inhale 2 puffs into the lungs  every 6 (six) hours as needed for wheezing or shortness of breath.   allopurinol 300 MG tablet Commonly known as:  ZYLOPRIM take 1 tablet (300 mg) by mouth daily with supper - for gout   ALPRAZolam 0.25 MG tablet Commonly known as:  XANAX Take 1 tab as needed for anxiety, cramping, air hunger every 8 hours as needed.   ciprofloxacin 500 MG tablet Commonly known as:  CIPRO Take 1 tablet (500 mg total) by mouth every Monday, Wednesday, and Friday for 10 days.   citalopram 20 MG tablet Commonly known as:  CELEXA Take 1 tablet (20 mg total) by mouth daily. What changed:    medication strength  how much to take   cyclobenzaprine 10 MG tablet Commonly known as:  FLEXERIL Take 1 tablet (10 mg total) by mouth 3 (three) times daily as needed for muscle spasms.   ferrous sulfate 325 (65 FE) MG tablet Take 1 tablet (325 mg total) by mouth daily with breakfast.   gabapentin 600 MG tablet Commonly known as:  NEURONTIN Take 1 tablet (600 mg total) by mouth at bedtime. What changed:  when to take this   Glycopyrrolate-Formoterol 9-4.8 MCG/ACT Aero Inhale 2 puffs into the lungs 2 (two) times daily. Notes to patient:  Continue home schedule   ipratropium 0.02 % nebulizer solution Commonly known as:  ATROVENT Take 3 mLs by nebulization every 4 (four) hours as needed for wheezing or shortness of breath.   lactulose 10 GM/15ML solution Commonly known as:  CHRONULAC take 43ms TWICE DAILY What changed:    how much to take  how to take this  when to take this  additional instructions   Magnesium 250 MG Tabs Take 250 mg by mouth at bedtime.   metolazone 5 MG tablet Commonly known as:  ZAROXOLYN Take 1 tablet daily or as directed for fluid Overload What changed:    how much to take  how to take this  when to take this  additional instructions   midodrine 5 MG tablet Commonly known as:  PROAMATINE Take 1 tablet (5 mg total) by mouth 3 (three) times daily with meals.    ondansetron 4 MG tablet Commonly known as:  ZOFRAN Take 1 tablet (4 mg total) by mouth every 6 (six) hours as needed for nausea or vomiting.   OXYGEN Inhale 2-3 L into the lungs continuous. 3 L with exertion Notes to patient:  Continue home schedule   pantoprazole 40 MG tablet Commonly known as:  PROTONIX Take 1 tablet (40 mg total) by mouth daily.   potassium chloride 10 MEQ tablet Commonly known as:  K-DUR  Take 1 tablet (10 mEq total) by mouth every Monday, Wednesday, and Friday. What changed:    when to take this  additional instructions   sodium chloride flush 0.9 % Soln Commonly known as:  NS 3 mLs by Other route as needed. Peritoneal drain flush as needed   spironolactone 25 MG tablet Commonly known as:  ALDACTONE Take 1 tablet (25 mg total) by mouth 2 (two) times daily.   traMADol 50 MG tablet Commonly known as:  ULTRAM Take 50 mg by mouth every 12 hours as needed for pain for chronic pain   vitamin C 500 MG tablet Commonly known as:  ASCORBIC ACID Take 500 mg by mouth 3 (three) times daily. Notes to patient:  Continue home schedule   Vitamin D3 5000 units Caps Take 5,000 Units by mouth daily with breakfast.      Allergies  Allergen Reactions  . Atorvastatin Other (See Comments)    Unknown reaction  . Diphenhydramine Hcl (Sleep) Hives  . Hydrocodone-Acetaminophen Other (See Comments)    Unknown reaction  . Lopid [Gemfibrozil] Other (See Comments)    Unknown reaction  . Loratadine Hives  . Lorazepam Hives  . Simvastatin Other (See Comments)    Unknown reaction  . Sulfamethoxazole Hives  . Sulfonamide Derivatives Hives   Follow-up Information    Unk Pinto, MD Follow up in 1 week(s).   Specialty:  Internal Medicine Why:  hospital discharge follow up Contact information: [address] [place] [address] [facility] Follow up in 1 week(s).   Specialty:  Gastroenterology Why:  for  cirrhosis , anemia, outpatient blood transfusion to be arranged by GI Contact information: [place] [address] [phone]       outpateint palliative care Follow up.        [place] Follow up.   Specialty:  Radiology Why:  drain clinic for questions regarding Tunneled peritoneal PleurX catheter  Contact information: [address] [id number] [place] [phone]           The results of significant diagnostics from this hospitalization (including imaging, microbiology, ancillary and laboratory) are listed below for reference.    Significant Diagnostic Studies: Dg Chest 2 View  Result Date: 03/10/2018 CLINICAL DATA:  Shortness of breath.  Abdominal pain/distension. EXAM: CHEST - 2 VIEW COMPARISON:  March 07, 2018 FINDINGS: There is a tiny calcified granuloma in the right upper lung. The heart, hila, and mediastinum are normal. No pneumothorax. Mild opacity in the bases, right greater than left is similar. No overt edema. Right-sided rib fractures again noted. IMPRESSION: 1. Mild bibasilar opacities, similar to mildly improved in the interval may represent atelectasis. Subtle infiltrate is considered less likely. No overt edema. Electronically Signed   By: Dorise Bullion III M.D   On: 03/10/2018 18:55   Dg Chest 2 View  Result Date: 03/07/2018 CLINICAL DATA:  Increased shortness of breath and fluid retention. Ex-smoker. EXAM: CHEST - 2 VIEW COMPARISON:  02/25/2018. FINDINGS: Borderline enlarged cardiac silhouette. Increased prominence of the pulmonary vasculature and interstitial markings with small bilateral pleural effusions, increased. Small calcified granuloma in the right upper lobe. Mild thoracic spine degenerative changes. Right shoulder prosthesis and fixation anchor. Cervical spine fixation hardware. IMPRESSION: Acute congestive heart failure.  Electronically Signed   By: Claudie Revering M.D.   On: 03/07/2018 18:27   Dg Chest 2 View  Result Date: 02/25/2018 CLINICAL  DATA:  Short of breath on exertion EXAM: CHEST - 2 VIEW COMPARISON:  12/25/2017, 09/27/2017 FINDINGS: Postsurgical changes in the cervical spine. Tiny pleural effusions. No focal consolidation. Borderline to mild cardiomegaly. Aortic atherosclerosis. No pneumothorax. Treated compression deformity of the lower spine. Right shoulder replacement. IMPRESSION: Tiny pleural effusions.  Stable borderline to mild cardiomegaly. Electronically Signed   By: Donavan Foil M.D.   On: 02/25/2018 21:10   Dg Cervical Spine 2 Or 3 Views  Result Date: 02/27/2018 CLINICAL DATA:  Right arm pain and swelling. History of multiple surgeries. EXAM: CERVICAL SPINE - 2-3 VIEW COMPARISON:  Cervical spine radiographs September 25, 2017 FINDINGS: Cervical vertebral bodies intact. Status post C4 through C6 ACDF with arthrodesis. C4 through C6 facet screws. Intact hardware without periprosthetic lucency. Nonsurgically altered disc heights preserved. Lateral masses in alignment. Osteopenia without destructive bony lesions. Prevertebral and paraspinal soft tissue planes are non suspicious. IMPRESSION: 1. Status post C4 through C6 fusion, with arthrodesis, no radiographic findings of hardware failure. 2. No fracture deformity or malalignment. Electronically Signed   By: [place] Alas M.D.   On: 02/27/2018 16:22   Dg Shoulder Right  Result Date: 02/27/2018 CLINICAL DATA:  RIGHT shoulder pain.  History of surgery. EXAM: RIGHT SHOULDER - 2+ VIEW COMPARISON:  RIGHT shoulder radiograph September 03, 2014 FINDINGS: Status post RIGHT shoulder arthroplasty. Stable appearance of hardware, stem approximates the cortex which is unchanged. No periprosthetic lucency. Suture anchor. Chronic humerus deformity, medially directed exostosis. Osteopenia. No fracture deformity or dislocation. No destructive bony lesions. Soft tissue  planes are non suspicious. IMPRESSION: 1. No acute fracture deformity or dislocation. 2. Status post RIGHT shoulder arthroplasty with stable appearance of hardware. Electronically Signed   By: [place] Alas M.D.   On: 02/27/2018 16:20   Ir Perc Athena Masse Perit Cath Wo Port  Result Date: 03/14/2018 CLINICAL DATA:  History of cirrhosis and refractory ascites. EXAM: INSERTION OF TUNNELED PERITONEAL DRAINAGE CATHETER ANESTHESIA/SEDATION: 1.5 mg IV Versed; 50 mcg IV Fentanyl. Total Moderate Sedation Time 16 minutes. The patient's level of consciousness and physiologic status were continuously monitored during the procedure by Radiology nursing. MEDICATIONS: 2 g IV Ancef. Antibiotic was administered in an appropriate time interval for the procedure. FLUOROSCOPY TIME:  Less than 6 seconds.  0.1 mGy. PROCEDURE: The procedure, risks, benefits, and alternatives were explained to the patient. Questions regarding the procedure were encouraged and answered. The patient understands and consents to the procedure. A time-out was performed prior to initiating the procedure. Ultrasound was used to localize ascites. The right abdominal wall was prepped with chlorhexidine in a sterile fashion, and a sterile drape was applied covering the operative field. A sterile gown and sterile gloves were used for the procedure. Local anesthesia was provided with 1% Lidocaine. Ultrasound image documentation was performed. Fluoroscopy during the procedure and fluoroscopic spot radiograph confirms appropriate catheter position. After creating a small skin incision, a 19 gauge needle was advanced into the peritoneal cavity under ultrasound guidance. A guide wire was then advanced under fluoroscopy into the peritoneal cavity. Peritoneal access was dilated serially and a 16-French peel-away sheath placed. A 16 French tunneled PleurX catheter was placed. This was tunneled from an incision 5 cm below the peritoneal access to the access site. The catheter  was advanced through the peel-away sheath. The sheath was then removed. Final catheter positioning was confirmed with a fluoroscopic spot image. The peritoneal access incision was closed with subcuticular 4-0 Vicryl. Dermabond was applied to the incision. A Prolene retention suture  was applied at the catheter exit site. Large volume paracentesis was performed through the new catheter utilizing drainage bottles. COMPLICATIONS: None. FINDINGS: The catheter was placed via the right abdominal wall. Catheter course is in the right peritoneal cavity. Approximately 3 liters of ascites was able to be removed after catheter placement. IMPRESSION: Placement of tunneled peritoneal drainage catheter via right abdominal approach. Three liters of ascites was removed today after catheter placement. Electronically Signed   By: Aletta Edouard M.D.   On: 03/14/2018 17:22   Ir Paracentesis  Result Date: 03/11/2018 INDICATION: Recurrent ascites, history of cirrhosis - paracentesis in ED on 8/2 with 2.5L amber fluid removed. Patient returned today to ED with complaints of 10 lb weight gain since Friday, increased peripheral edema and abdominal distention. Request for diagnostic and therapeutic paracentesis. EXAM: ULTRASOUND GUIDED DIAGNOSTIC AND THERAPEUTIC PARACENTESIS MEDICATIONS: 10 mL 2% lidocaine. COMPLICATIONS: None immediate. PROCEDURE: Informed written consent was obtained from the patient after a discussion of the risks, benefits and alternatives to treatment. A timeout was performed prior to the initiation of the procedure. Initial ultrasound scanning demonstrates a large amount of ascites within the left lower abdominal quadrant. The left lower abdomen was prepped and draped in the usual sterile fashion. 2% lidocaine was used for local anesthesia. Following this, a 19 gauge, 10-cm, Yueh catheter was introduced. An ultrasound image was saved for documentation purposes. The paracentesis was performed. The catheter was  removed and a dressing was applied. The patient tolerated the procedure well without immediate post procedural complication. FINDINGS: A total of approximately 1.8L of cloudy yellow fluid was removed. Samples were sent to the laboratory as requested by the clinical team. IMPRESSION: Successful ultrasound-guided paracentesis yielding 1.8 liters of peritoneal fluid. Read by Candiss Norse, PA-C Electronically Signed   By: Marybelle Killings M.D.   On: 03/11/2018 12:59   Ir Paracentesis  Result Date: 02/27/2018 INDICATION: Patient with history of cirrhosis, recurrent ascites. Request is made for diagnostic and therapeutic paracentesis. EXAM: ULTRASOUND GUIDED DIAGNOSTIC AND THERAPEUTIC PARACENTESIS MEDICATIONS: 10 mL 2% lidocaine COMPLICATIONS: None immediate. PROCEDURE: Informed written consent was obtained from the patient after a discussion of the risks, benefits and alternatives to treatment. A timeout was performed prior to the initiation of the procedure. Initial ultrasound scanning demonstrates a small amount of ascites within the left lateral abdomen. The left lateral abdomen was prepped and draped in the usual sterile fashion. 2% lidocaine was used for local anesthesia. Following this, a 6 Fr Safe-T-Centesis catheter was introduced. An ultrasound image was saved for documentation purposes. The paracentesis was performed. The catheter was removed and a dressing was applied. The patient tolerated the procedure well without immediate post procedural complication. FINDINGS: A total of approximately 500 mL of clear, yellow fluid was removed. Samples were sent to the laboratory as requested by the clinical team. IMPRESSION: Successful ultrasound-guided diagnostic and therapeutic paracentesis yielding 500 mL of peritoneal fluid. Read by: Brynda Greathouse PA-C Electronically Signed   By: Markus Daft M.D.   On: 02/27/2018 09:58    Microbiology: Recent Results (from the past 240 hour(s))  Culture, blood (Routine X 2)  w Reflex to ID Panel     Status: None (Preliminary result)   Collection Time: 03/11/18  3:50 AM  Result Value Ref Range Status   Specimen Description BLOOD LEFT ARM  Final   Special Requests   Final    BOTTLES DRAWN AEROBIC ONLY Blood Culture adequate volume   Culture   Final    NO GROWTH  3 DAYS Performed at [facility], [place] [address] [place], [place] [address]    Report Status PENDING  Incomplete  Culture, blood (Routine X 2) w Reflex to ID Panel     Status: None (Preliminary result)   Collection Time: 03/11/18  3:55 AM  Result Value Ref Range Status   Specimen Description BLOOD LEFT WRIST  Final   Special Requests   Final    BOTTLES DRAWN AEROBIC ONLY Blood Culture adequate volume   Culture   Final    NO GROWTH 3 DAYS Performed at [facility], [place] [address], [place], [place] [address]    Report Status PENDING  Incomplete  Gram stain     Status: None   Collection Time: 03/11/18 12:22 PM  Result Value Ref Range Status   Specimen Description FLUID PERITONEAL  Final   Special Requests NONE  Final   Gram Stain   Final    RARE WBC PRESENT, PREDOMINANTLY MONONUCLEAR NO ORGANISMS SEEN Performed at [facility], [place] [address], [place], [place] [address]    Report Status 03/11/2018 FINAL  Final  Culture, body fluid-bottle     Status: None (Preliminary result)   Collection Time: 03/11/18 12:22 PM  Result Value Ref Range Status   Specimen Description FLUID PERITONEAL  Final   Special Requests NONE  Final   Culture   Final    NO GROWTH 3 DAYS Performed at [facility] [address] [place], [place] [address]    Report Status PENDING  Incomplete     Labs: Basic Metabolic Panel: Recent Labs  Lab 03/10/18 1803 03/11/18 0355 03/13/18 0526 03/14/18 0406  NA 137 138 138 137  K 4.7 4.8 3.9 4.0  CL 101 102 102 101  CO2 28 25 28 26   GLUCOSE 120* 105* 94 107*  BUN 21 21 22  26*  CREATININE 1.45* 1.44* 1.58* 1.59*  CALCIUM 7.9* 8.0* 8.1* 8.1*    Liver Function Tests: Recent Labs  Lab 03/10/18 1803 03/13/18 0526 03/14/18 0406  AST 23 20 26   ALT 19 16 19   ALKPHOS 116 95 85  BILITOT 1.5* 1.0 1.1  PROT 4.9* 3.9* 4.3*  ALBUMIN 2.6* 2.1* 2.3*   Recent Labs  Lab 03/10/18 1803  LIPASE 69*   Recent Labs  Lab 03/11/18 0355 03/13/18 0526 03/13/18 1611 03/14/18 0406  AMMONIA 84* 55* 78* 71*   CBC: Recent Labs  Lab 03/10/18 1803 03/11/18 0355 03/13/18 0526 03/14/18 0406  WBC 5.1 5.6 3.9* 4.9  HGB 8.3* 7.6* 6.5* 7.9*  HCT 27.9* 24.9* 21.2* 25.3*  MCV 111.6* 109.2* 109.3* 106.8*  PLT 115* 110* 96* 99*   Cardiac Enzymes: Recent Labs  Lab 03/12/18 0728  TROPONINI <0.03   BNP: BNP (last 3 results) Recent Labs    09/08/17 1107 09/16/17 1253 02/25/18 1538  BNP 144.3* 157.0* 283.7*    ProBNP (last 3 results) No results for input(s): PROBNP in the last 8760 hours.  CBG: No results for input(s): GLUCAP in the last 168 hours.     Signed:  Florencia Reasons MD, PhD  [facility] 03/14/2018, 6:32 PM

## 2018-03-14 NOTE — Progress Notes (Signed)
Nurse went over discharge With patient and family. Patient and family verbalized understanding of discharge. All questions and concerns addressed. Discharging home with home health. Taking down in a wheelchair with all belongings

## 2018-03-14 NOTE — Consult Note (Signed)
   [facility] CM Inpatient Consult   03/14/2018  [name] 1946-05-09 [id number]   Assess for need for extreme high risk for unplanned re-admission.   Patient is a re-admission.  Patient has been active with Palliative Care in the past per progress notes and with a HX with [facility] Care Management. A palliative care consult has been requested. Will follow as appropriate. For questions, please contact:  Natividad Brood, RN BSN [facility] Liaison  [phone] business mobile phone Toll free office [phone]

## 2018-03-16 LAB — CULTURE, BODY FLUID-BOTTLE

## 2018-03-16 LAB — CULTURE, BLOOD (ROUTINE X 2)
Culture: NO GROWTH
Culture: NO GROWTH
SPECIAL REQUESTS: ADEQUATE
Special Requests: ADEQUATE

## 2018-03-16 LAB — CULTURE, BODY FLUID W GRAM STAIN -BOTTLE: Culture: NO GROWTH

## 2018-03-17 ENCOUNTER — Telehealth: Payer: Self-pay | Admitting: *Deleted

## 2018-03-17 ENCOUNTER — Telehealth: Payer: Self-pay | Admitting: Gastroenterology

## 2018-03-17 ENCOUNTER — Telehealth: Payer: Self-pay

## 2018-03-17 NOTE — Telephone Encounter (Signed)
I would start with 500 cc daily or could do 1 L every other day  May take up to 2 L in 1 day prn if short of breath   Are they able to track weights?

## 2018-03-17 NOTE — Telephone Encounter (Signed)
A message was left to inform [name], it is OK for a Palliative Care consult, per Dr Melford Aase.

## 2018-03-17 NOTE — Telephone Encounter (Signed)
Faxed over an order to [facility] agency, fax # [phone].

## 2018-03-17 NOTE — Telephone Encounter (Signed)
Routed to DOD, Dr. Havery Moros patient, patient was just released from hospital. Home health nurse called today and is needing order on the PleurX catheter as far as how often to drain and amount to drain. Please advise and I will fax an order. Thank you.

## 2018-03-17 NOTE — Telephone Encounter (Signed)
Called patient on 03/17/2018 , 11:37 AM in an attempt to reach the patient for a hospital follow up.   Admit date: 03/10/18 Discharge: 03/14/18   She does not  have any questions or concerns about medications from the hospital admission. The patient's medications were reviewed over the phone, they were counseled to bring in all current medications to the hospital follow up visit.   I advised the patient to call if any questions or concerns arise about the hospital admission or medications    Home health was started in the hospital.  All questions were answered and a follow up appointment was made.   Prior to Admission medications   Medication Sig Start Date End Date Taking? Authorizing Provider  albuterol (PROVENTIL HFA;VENTOLIN HFA) 108 (90 Base) MCG/ACT inhaler Inhale 2 puffs into the lungs every 6 (six) hours as needed for wheezing or shortness of breath. 05/27/16   Ward, Delice Bison, DO  allopurinol (ZYLOPRIM) 300 MG tablet take 1 tablet (300 mg) by mouth daily with supper - for gout 03/04/18   Unk Pinto, MD  ALPRAZolam Duanne Moron) 0.25 MG tablet Take 1 tab as needed for anxiety, cramping, air hunger every 8 hours as needed. 11/01/17   Liane Comber, NP  Cholecalciferol (VITAMIN D3) 5000 units CAPS Take 5,000 Units by mouth daily with breakfast.    [provider]  ciprofloxacin (CIPRO) 500 MG tablet Take 1 tablet (500 mg total) by mouth every Monday, Wednesday, and Friday for 10 days. 03/14/18 03/24/18  Florencia Reasons, MD  citalopram (CELEXA) 20 MG tablet Take 1 tablet (20 mg total) by mouth daily. 03/14/18 03/14/19  Florencia Reasons, MD  cyclobenzaprine (FLEXERIL) 10 MG tablet Take 1 tablet (10 mg total) by mouth 3 (three) times daily as needed for muscle spasms. 03/06/16   Unk Pinto, MD  ferrous sulfate 325 (65 FE) MG tablet Take 1 tablet (325 mg total) by mouth daily with breakfast. 03/01/18   Florencia Reasons, MD  gabapentin (NEURONTIN) 600 MG tablet Take 1 tablet (600 mg total) by mouth at bedtime.  03/14/18   Florencia Reasons, MD  Glycopyrrolate-Formoterol (BEVESPI AEROSPHERE) 9-4.8 MCG/ACT AERO Inhale 2 puffs into the lungs 2 (two) times daily. 12/25/17   Tanda Rockers, MD  ipratropium (ATROVENT) 0.02 % nebulizer solution Take 3 mLs by nebulization every 4 (four) hours as needed for wheezing or shortness of breath. 01/29/18   [provider]  lactulose (CHRONULAC) 10 GM/15ML solution take 30ms TWICE DAILY Patient taking differently: Take 20 g by mouth daily.  08/08/17   MUnk Pinto MD  Magnesium 250 MG TABS Take 250 mg by mouth at bedtime.     [provider]  metolazone (ZAROXOLYN) 5 MG tablet Take 1 tablet daily or as directed for fluid Overload Patient taking differently: Take 10 mg by mouth daily.  02/20/18   MUnk Pinto MD  midodrine (PROAMATINE) 5 MG tablet Take 1 tablet (5 mg total) by mouth 3 (three) times daily with meals. 03/01/18   XFlorencia Reasons MD  ondansetron (ZOFRAN) 4 MG tablet Take 1 tablet (4 mg total) by mouth every 6 (six) hours as needed for nausea or vomiting. 09/02/17   CVicie Mutters PA-C  OXYGEN Inhale 2-3 L into the lungs continuous. 3 L with exertion    [provider]  pantoprazole (PROTONIX) 40 MG tablet Take 1 tablet (40 mg total) by mouth daily. 03/04/18   MUnk Pinto MD  potassium chloride (K-DUR) 10 MEQ tablet Take 1 tablet (10 mEq total) by  mouth every Monday, Wednesday, and Friday. 03/14/18   Florencia Reasons, MD  sodium chloride flush (NS) 0.9 % SOLN 3 mLs by Other route as needed. Peritoneal drain flush as needed 03/14/18 04/13/18  Florencia Reasons, MD  spironolactone (ALDACTONE) 25 MG tablet Take 1 tablet (25 mg total) by mouth 2 (two) times daily. 03/01/18   Florencia Reasons, MD  traMADol Veatrice Bourbon) 50 MG tablet Take 50 mg by mouth every 12 hours as needed for pain for chronic pain 03/03/18   Liane Comber, NP  vitamin C (ASCORBIC ACID) 500 MG tablet Take 500 mg by mouth 3 (three) times daily.    [provider]

## 2018-03-17 NOTE — Telephone Encounter (Signed)
Phone call placed to patient to offer to schedule visit with Palliative Care. Scheduled for Thursday 03/20/18

## 2018-03-18 ENCOUNTER — Telehealth: Payer: Self-pay | Admitting: Gastroenterology

## 2018-03-18 ENCOUNTER — Ambulatory Visit (INDEPENDENT_AMBULATORY_CARE_PROVIDER_SITE_OTHER): Payer: PPO | Admitting: Internal Medicine

## 2018-03-18 ENCOUNTER — Other Ambulatory Visit: Payer: Self-pay

## 2018-03-18 VITALS — BP 124/50 | HR 80 | Temp 97.3°F | Resp 18 | Ht 62.0 in | Wt 196.0 lb

## 2018-03-18 DIAGNOSIS — I5032 Chronic diastolic (congestive) heart failure: Secondary | ICD-10-CM | POA: Diagnosis not present

## 2018-03-18 DIAGNOSIS — K7581 Nonalcoholic steatohepatitis (NASH): Secondary | ICD-10-CM

## 2018-03-18 DIAGNOSIS — J449 Chronic obstructive pulmonary disease, unspecified: Secondary | ICD-10-CM | POA: Diagnosis not present

## 2018-03-18 DIAGNOSIS — D696 Thrombocytopenia, unspecified: Secondary | ICD-10-CM

## 2018-03-18 DIAGNOSIS — R1084 Generalized abdominal pain: Secondary | ICD-10-CM

## 2018-03-18 DIAGNOSIS — K219 Gastro-esophageal reflux disease without esophagitis: Secondary | ICD-10-CM

## 2018-03-18 DIAGNOSIS — N183 Chronic kidney disease, stage 3 unspecified: Secondary | ICD-10-CM

## 2018-03-18 DIAGNOSIS — I10 Essential (primary) hypertension: Secondary | ICD-10-CM | POA: Diagnosis not present

## 2018-03-18 DIAGNOSIS — D500 Iron deficiency anemia secondary to blood loss (chronic): Secondary | ICD-10-CM

## 2018-03-18 DIAGNOSIS — Z79899 Other long term (current) drug therapy: Secondary | ICD-10-CM | POA: Diagnosis not present

## 2018-03-18 DIAGNOSIS — R601 Generalized edema: Secondary | ICD-10-CM

## 2018-03-18 DIAGNOSIS — K7460 Unspecified cirrhosis of liver: Secondary | ICD-10-CM

## 2018-03-18 DIAGNOSIS — R188 Other ascites: Secondary | ICD-10-CM

## 2018-03-18 NOTE — Patient Instructions (Signed)
Cirrhosis Cirrhosis is long-term (chronic) liver injury. The liver is your largest internal organ, and it performs many functions. The liver converts food into energy, removes toxic material from your blood, makes important proteins, and absorbs necessary vitamins from your diet. If you have cirrhosis, it means many of your healthy liver cells have been replaced by scar tissue. This prevents blood from flowing through your liver, which makes it difficult for your liver to function. This scarring is not reversible, but treatment can prevent it from getting worse. What are the causes? Hepatitis C and long-term alcohol abuse are the most common causes of cirrhosis. Other causes include:  Nonalcoholic fatty liver disease.  Hepatitis B infection.  Autoimmune hepatitis.  Diseases that cause blockage of ducts inside the liver.  Inherited liver diseases.  Reactions to certain long-term medicines.  Parasitic infections.  Long-term exposure to certain toxins.  What increases the risk? You may have a higher risk of cirrhosis if you:  Have certain hepatitis viruses.  Abuse alcohol, especially if you are female.  Are overweight.  Share needles.  Have unprotected sex with someone who has hepatitis.  What are the signs or symptoms? You may not have any signs and symptoms at first. Symptoms may not develop until the damage to your liver starts to get worse. Signs and symptoms of cirrhosis may include:  Tenderness in the right-upper part of your abdomen.  Weakness and tiredness (fatigue).  Loss of appetite.  Nausea.  Weight loss and muscle loss.  Itchiness.  Yellow skin and eyes (jaundice).  Buildup of fluid in the abdomen (ascites).  Swelling of the feet and ankles (edema).  Appearance of tiny blood vessels under the skin.  Mental confusion.  Easy bruising and bleeding.  How is this diagnosed? Your health care provider may suspect cirrhosis based on your symptoms and  medical history, especially if you have other medical conditions or a history of alcohol abuse. Your health care provider will do a physical exam to feel your liver and check for signs of cirrhosis. Your health care provider may perform other tests, including:  Blood tests to check: ? Whether you have hepatitis B or C. ? Kidney function. ? Liver function.  Imaging tests such as: ? MRI or CT scan to look for changes seen in advanced cirrhosis. ? Ultrasound to see if normal liver tissue is being replaced by scar tissue.  A procedure using a long needle to take a sample of liver tissue (biopsy) for examination under a microscope. Liver biopsy can confirm the diagnosis of cirrhosis.  How is this treated? Treatment depends on how damaged your liver is and what caused the damage. Treatment may include treating cirrhosis symptoms or treating the underlying causes of the condition to try to slow the progression of the damage. Treatment may include:  Making lifestyle changes, such as: ? Eating a healthy diet. ? Restricting salt intake. ? Maintaining a healthy weight. ? Not abusing drugs or alcohol.  Taking medicines to: ? Treat liver infections or other infections. ? Control itching. ? Reduce fluid buildup. ? Reduce certain blood toxins. ? Reduce risk of bleeding from enlarged blood vessels in the stomach or esophagus (varices).  If varices are causing bleeding problems, you may need treatment with a procedure that ties up the vessels causing them to fall off (band ligation).  If cirrhosis is causing your liver to fail, your health care provider may recommend a liver transplant.  Other treatments may be recommended depending on any complications  of cirrhosis, such as liver-related kidney failure (hepatorenal syndrome).  Follow these instructions at home:  Take medicines only as directed by your health care provider. Do not use drugs that are toxic to your liver. Ask your health care  provider before taking any new medicines, including over-the-counter medicines.  Rest as needed.  Eat a well-balanced diet. Ask your health care provider or dietitian for more information.  You may have to follow a low-salt diet or restrict your water intake as directed.  Do not drink alcohol. This is especially important if you are taking acetaminophen.  Keep all follow-up visits as directed by your health care provider. This is important. Contact a health care provider if:  You have fatigue or weakness that is getting worse.  You develop swelling of the hands, feet, legs, or face.  You have a fever.  You develop loss of appetite.  You have nausea or vomiting.  You develop jaundice.  You develop easy bruising or bleeding. Get help right away if:  You vomit bright red blood or a material that looks like coffee grounds.  You have blood in your stools.  Your stools appear black and tarry.  You become confused.  You have chest pain or trouble breathing. This information is not intended to replace advice given to you by your health care provider. Make sure you discuss any questions you have with your health care provider. Document Released: 07/23/2005 Document Revised: 12/01/2015 Document Reviewed: 03/31/2014 Elsevier Interactive Patient Education  [employer].

## 2018-03-18 NOTE — Patient Outreach (Addendum)
[facility]) Care Management  03/18/2018  [name] 1945/11/02 [id number]   72 year old female outreached by [place] services requested for 30 day post discharge medication review.   PMHx includes, but not limited to, esophageal varices, hypertension, diastolic heart failure, COPD GOLD IV, GERD and hyperlipidemia.   Unsuccessful outreach attempt #1 to [name].  Left HIPAA compliant voice message requesting a return call.  Plan: Outreach attempt #2 in 3-4 business days.  [name], PharmD Clinical Pharmacist [place] [phone]

## 2018-03-18 NOTE — Telephone Encounter (Signed)
Spoke to patient's husband, per the [facility] nurse, they did not receive the orders for removing the pleurX catheter fluid. I called to [facility] and they did find the orders. They will put them in their system and contact the nurse that is on the way out to patient's home.

## 2018-03-18 NOTE — Progress Notes (Signed)
[name]     This very nice 72 y.o. MWF was admitted to the hospital on 03/11/2018 and patient was discharged from the hospital on 03/14/2018. The patient now presents for follow up for transition from recent hospitalization.  The day after discharge  our clinical staff contacted the patient to assure stability and schedule a follow up appointment. The discharge summary, medications and diagnostic test results were reviewed before meeting with the patient. The patient was admitted for:   Generalized abdominal pain Iron deficiency anemia due to chronic blood loss Chronic diastolic CHF (congestive heart failure)  CKD (chronic kidney disease) stage 3, GFR 30-59 ml/min Liver cirrhosis secondary to NASH Ascites Anasarca Stage 4 very severe COPD by GOLD classification  Thrombocytopenia  Essential hypertension Gastroesophageal reflux disease     This very nice , but unfortunate 72 yo MWF with End Stage Liver Disease/NASH/Cirrhosis s/p recent RF Ablation x 2 of  Hepatomas & who had recent Abdominal paracentesis on 03/07/2018 of 2,500 cc for worsening scites and was discharged from the ER. She also has esophageal Varices and portal hypertensive gastropathy.  She then returned to the ER again for Abdominal pain , worsening Ascites and 10 # weight gain. A Peritoneal PleurX catheter was placed by Interventional Radiology. She was seen by Palliative Care and agreed to DNR status  But her spouse remains adamant against Hospice despite her guarded prognosis.                                                                    Hospitalization discharge instructions and medications are reconciled with the patient.      Patient is also followed with Hypertension, Hyperlipidemia, Pre-Diabetes and Vitamin D Deficiency.      Patient is treated for HTN & BP has been controlled at home. Today's BP is 124/50. Patient has had no complaints of any cardiac type chest pain, palpitations, dyspnea/orthopnea/PND,  dizziness, claudication, or dependent edema.     Hyperlipidemia is controlled with diet & meds. Patient denies myalgias or other med SE's. Last Lipids were  Lab Results  Component Value Date   CHOL 177 05/31/2017   HDL 53 05/31/2017   LDLCALC 101 (H) 05/31/2017   TRIG 130 05/31/2017   CHOLHDL 3.3 05/31/2017      Also, the patient has history of T2_NIDDM PreDiabetes and has had no symptoms of reactive hypoglycemia, diabetic polys, paresthesias or visual blurring.  Last A1c was  Lab Results  Component Value Date   HGBA1C 5.1 09/03/2017      Further, the patient also has history of Vitamin D Deficiency and supplements vitamin D without any suspected side-effects. Last vitamin D was   Lab Results  Component Value Date   VD25OH 59 09/03/2017   Current Outpatient Medications on File Prior to Visit  Medication Sig  . albuterol (PROVENTIL HFA;VENTOLIN HFA) 108 (90 Base) MCG/ACT inhaler Inhale 2 puffs into the lungs every 6 (six) hours as needed for wheezing or shortness of breath.  . allopurinol (ZYLOPRIM) 300 MG tablet take 1 tablet (300 mg) by mouth daily with supper - for gout  . ALPRAZolam (XANAX) 0.25 MG tablet Take 1 tab as needed for anxiety, cramping, air hunger every 8 hours as needed.  . Cholecalciferol (  VITAMIN D3) 5000 units CAPS Take 5,000 Units by mouth daily with breakfast.  . ciprofloxacin (CIPRO) 500 MG tablet Take 1 tablet (500 mg total) by mouth every Monday, Wednesday, and Friday for 10 days.  . citalopram (CELEXA) 20 MG tablet Take 1 tablet (20 mg total) by mouth daily.  . cyclobenzaprine (FLEXERIL) 10 MG tablet Take 1 tablet (10 mg total) by mouth 3 (three) times daily as needed for muscle spasms.  . ferrous sulfate 325 (65 FE) MG tablet Take 1 tablet (325 mg total) by mouth daily with breakfast.  . gabapentin (NEURONTIN) 600 MG tablet Take 1 tablet (600 mg total) by mouth at bedtime.  . Glycopyrrolate-Formoterol (BEVESPI AEROSPHERE) 9-4.8 MCG/ACT AERO Inhale 2 puffs  into the lungs 2 (two) times daily.  Marland Kitchen ipratropium (ATROVENT) 0.02 % nebulizer solution Take 3 mLs by nebulization every 4 (four) hours as needed for wheezing or shortness of breath.  . lactulose (CHRONULAC) 10 GM/15ML solution take 34ms TWICE DAILY (Patient taking differently: Take 20 g by mouth daily. )  . Magnesium 250 MG TABS Take 250 mg by mouth at bedtime.   . metolazone (ZAROXOLYN) 5 MG tablet Take 1 tablet daily or as directed for fluid Overload (Patient taking differently: Take 10 mg by mouth daily. )  . midodrine (PROAMATINE) 5 MG tablet Take 1 tablet (5 mg total) by mouth 3 (three) times daily with meals.  . ondansetron (ZOFRAN) 4 MG tablet Take 1 tablet (4 mg total) by mouth every 6 (six) hours as needed for nausea or vomiting.  . OXYGEN Inhale 2-3 L into the lungs continuous. 3 L with exertion  . pantoprazole (PROTONIX) 40 MG tablet Take 1 tablet (40 mg total) by mouth daily.  . potassium chloride (K-DUR) 10 MEQ tablet Take 1 tablet (10 mEq total) by mouth every Monday, Wednesday, and Friday.  . sodium chloride flush (NS) 0.9 % SOLN 3 mLs by Other route as needed. Peritoneal drain flush as needed  . spironolactone (ALDACTONE) 25 MG tablet Take 1 tablet (25 mg total) by mouth 2 (two) times daily.  . traMADol (ULTRAM) 50 MG tablet Take 50 mg by mouth every 12 hours as needed for pain for chronic pain  . vitamin C (ASCORBIC ACID) 500 MG tablet Take 500 mg by mouth 3 (three) times daily.   No current facility-administered medications on file prior to visit.    Allergies  Allergen Reactions  . Atorvastatin Other (See Comments)    Unknown reaction  . Diphenhydramine Hcl (Sleep) Hives  . Hydrocodone-Acetaminophen Other (See Comments)    Unknown reaction  . Lopid [Gemfibrozil] Other (See Comments)    Unknown reaction  . Loratadine Hives  . Lorazepam Hives  . Simvastatin Other (See Comments)    Unknown reaction  . Sulfamethoxazole Hives  . Sulfonamide Derivatives Hives   PMHx:     Past Medical History:  Diagnosis Date  . Asthma   . Cancer ([facility]    liver cancer   . COPD (chronic obstructive pulmonary disease) ([place]    x 3 years   . Depression   . Diabetes mellitus without complication ([facility]    diet controlled  not on meds   . Edema    left leg   . Esophageal varices ([facility]   . Fibromyalgia   . Gastritis   . GERD (gastroesophageal reflux disease)   . Hepatic cirrhosis ([facility]   . Hepatic encephalopathy ([facility]   . History of blood transfusion   . Hyperlipidemia   .  Hypertension   . IBS (irritable bowel syndrome)   . Phlebitis    left leg x 2   . Pneumonia    hx of   . Splenomegaly   . Vitamin D deficiency    Immunization History  Administered Date(s) Administered  . Hep A / Hep B 11/05/2013, 11/12/2013, 12/07/2013, 12/31/2014  . Hepatitis B, adult 12/19/2015  . Hepatitis B, ped/adol 11/17/2015, 05/21/2016  . Influenza Split 05/26/2013, 06/17/2014  . Influenza, High Dose Seasonal PF 06/15/2015, 05/01/2017  . Influenza,inj,quad, With Preservative 05/22/2016  . Pneumococcal Conjugate-13 07/22/2015  . Pneumococcal Polysaccharide-23 08/28/2013  . Tdap 11/19/2012   Past Surgical History:  Procedure Laterality Date  . ABDOMINAL HYSTERECTOMY    . BACK SURGERY    . CATARACT EXTRACTION, BILATERAL    . CHOLECYSTECTOMY    . ESOPHAGOGASTRODUODENOSCOPY  07/16/2012   Procedure: ESOPHAGOGASTRODUODENOSCOPY (EGD);  Surgeon: Inda Castle, MD;  Location: [facility];  Service: Endoscopy;  Laterality: N/A;  . ESOPHAGOGASTRODUODENOSCOPY N/A 01/20/2018   Procedure: ESOPHAGOGASTRODUODENOSCOPY (EGD);  Surgeon: Ladene Artist, MD;  Location: [facility] ENDOSCOPY;  Service: Endoscopy;  Laterality: N/A;  . GASTRIC VARICES BANDING  07/16/2012   Procedure: GASTRIC VARICES BANDING;  Surgeon: Inda Castle, MD;  Location: [facility] ENDOSCOPY;  Service: Endoscopy;  Laterality: N/A;  . HIP ARTHROPLASTY Bilateral   . IR GENERIC HISTORICAL  06/25/2016   IR RADIOLOGIST EVAL & MGMT  06/25/2016 [facility]  . IR GENERIC HISTORICAL  06/27/2016   IR KYPHO LUMBAR INC FX REDUCE BONE BX UNI/BIL CANNULATION INC/IMAGING 06/27/2016 Luanne Bras, MD [facility]  . IR GENERIC HISTORICAL  07/17/2016   IR RADIOLOGIST EVAL & MGMT 07/17/2016 [facility]  . IR PARACENTESIS  01/20/2018  . IR PARACENTESIS  02/27/2018  . IR PARACENTESIS  03/11/2018  . IR PERC TUN PERIT CATH WO PORT S&I Dartha Lodge  03/14/2018  . IR RADIOLOGIST EVAL & MGMT  07/10/2017  . IR RADIOLOGIST EVAL & MGMT  10/01/2017  . IR RADIOLOGIST EVAL & MGMT  12/11/2017  . IR THORACENTESIS ASP PLEURAL SPACE W/IMG GUIDE  09/12/2017  . IR THORACENTESIS ASP PLEURAL SPACE W/IMG GUIDE  09/17/2017  . NECK SURGERY    . RADIOFREQUENCY ABLATION N/A 08/30/2017   Procedure: CT MICROWAVE THERMAL ABLATION;  Surgeon: Arne Cleveland, MD;  Location: [facility] ORS;  Service: Anesthesiology;  Laterality: N/A;  . SHOULDER SURGERY Right    FHx:    Reviewed / unchanged  SHx:    Reviewed / unchanged  Systems Review:  Constitutional: Denies fever, chills, wt changes, headaches, insomnia, fatigue, night sweats, change in appetite. Eyes: Denies redness, blurred vision, diplopia, discharge, itchy, watery eyes.  ENT: Denies discharge, congestion, post nasal drip, epistaxis, sore throat, earache, hearing loss, dental pain, tinnitus, vertigo, sinus pain, snoring.  CV: Denies chest pain, palpitations, irregular heartbeat, syncope, dyspnea, diaphoresis, orthopnea, PND, claudication or edema. Respiratory: denies cough, dyspnea, DOE, pleurisy, hoarseness, laryngitis, wheezing.  Gastrointestinal: Denies dysphagia, odynophagia, has had heartburn, reflux, water brash, abdominal distention with pain & cramps, some  nausea, no vomiting,  diarrhea, constipation, hematemesis, melena, hematochezia  or hemorrhoids. Genitourinary: Denies dysuria, frequency, urgency, nocturia, hesitancy, discharge, hematuria or flank pain. Musculoskeletal: Denies arthralgias, myalgias,  stiffness, jt. swelling, pain, limping or strain/sprain.  Skin: Denies pruritus, rash, hives, warts, acne, eczema or change in skin lesion(s). Neuro: No weakness, tremor, incoordination, spasms, paresthesia or pain. Psychiatric: Denies confusion, memory loss or sensory loss. Endo: Denies change in weight, skin or hair change.  Heme/Lymph: No excessive bleeding, bruising or  enlarged lymph nodes.  Physical Exam  BP (!) 124/50   Pulse 80   Temp (!) 97.3 F (36.3 C)   Resp 18   Ht 5' 2"  (1.575 m)   Wt 196 lb (88.9 kg)   BMI 35.85 kg/m   Appears chronically ill, appears uncomfortable and in no distress. Has nasal O2 at 2 Lit/min. No Cyanosis or icterus  Eyes: PERRLA, EOMs, conjunctiva no swelling or erythema. Sinuses: No frontal/maxillary tenderness ENT/Mouth: EAC's clear, TM's nl w/o erythema, bulging. Nares clear w/o erythema, swelling, exudates. Oropharynx clear without erythema or exudates. Oral hygiene is good. Tongue normal, non obstructing. Hearing intact.  Neck: Supple. Thyroid nl. Car 2+/2+ without bruits, nodes or JVD. Chest: Respirations nl with BS clear & equal w/o rales, rhonchi, wheezing or stridor.  Cor: Heart sounds soft/distant w/ regular rate and rhythm without sig. murmurs, gallops, clicks or rubs. Peripheral pulses obscured chronic LE edema. Abdomen: Very tense Ascites and diffusely tender w/o  rebound, hernias.  Lymphatics: Unremarkable.  Musculoskeletal: Generalized decrease in muscle bulk. In a wheel chair.  Skin: Warm, dry without exposed rashes, lesions or ecchymosis apparent.  Neuro: Cranial nerves intact, reflexes equal bilaterally. Sensory-motor testing grossly intact. Tendon reflexes flat thru-out.  Pysch: Alert & oriented x 3.  Insight and judgement nl & appropriate. No ideations.  Assessment and Plan:  1. Generalized abdominal pain  - CBC with Differential/Platelet - COMPLETE METABOLIC PANEL WITH GFR  2. Iron deficiency anemia due to chronic blood  loss  - CBC with Differential/Platelet  3. Chronic diastolic CHF (congestive heart failure) ([place])   4. CKD (chronic kidney disease) stage 3, GFR 30-59 ml/min (HCC)  - COMPLETE METABOLIC PANEL WITH GFR  5. Liver cirrhosis secondary to NASH (HCC)  - COMPLETE METABOLIC PANEL WITH GFR  6. Ascites   7. Anasarca   8. Stage 4 very severe COPD by GOLD classification ([place])   9. Thrombocytopenia (HCC)  - CBC with Differential/Platelet  10. Essential hypertension  - CBC with Differential/Platelet - COMPLETE METABOLIC PANEL WITH GFR  11. Gastroesophageal reflux disease  - CBC with Differential/Platelet  12. Medication management  - CBC with Differential/Platelet - COMPLETE METABOLIC PANEL WITH GFR       Discussed  regular stretching exercise, BP monitoring.  Recommended labs to assess and monitor clinical status  Over 45 minutes of exam, counseling, chart review was performed.

## 2018-03-19 ENCOUNTER — Encounter: Payer: Self-pay | Admitting: Internal Medicine

## 2018-03-19 LAB — CBC WITH DIFFERENTIAL/PLATELET
Basophils Absolute: 37 cells/uL (ref 0–200)
Basophils Relative: 0.7 %
EOS ABS: 498 {cells}/uL (ref 15–500)
Eosinophils Relative: 9.4 %
HCT: 26 % — ABNORMAL LOW (ref 35.0–45.0)
Hemoglobin: 8.7 g/dL — ABNORMAL LOW (ref 11.7–15.5)
Lymphs Abs: 774 cells/uL — ABNORMAL LOW (ref 850–3900)
MCH: 33.5 pg — AB (ref 27.0–33.0)
MCHC: 33.5 g/dL (ref 32.0–36.0)
MCV: 100 fL (ref 80.0–100.0)
MONOS PCT: 7.1 %
MPV: 11.5 fL (ref 7.5–12.5)
NEUTROS PCT: 68.2 %
Neutro Abs: 3615 cells/uL (ref 1500–7800)
PLATELETS: 132 10*3/uL — AB (ref 140–400)
RBC: 2.6 10*6/uL — ABNORMAL LOW (ref 3.80–5.10)
RDW: 14.9 % (ref 11.0–15.0)
TOTAL LYMPHOCYTE: 14.6 %
WBC mixed population: 376 cells/uL (ref 200–950)
WBC: 5.3 10*3/uL (ref 3.8–10.8)

## 2018-03-19 LAB — COMPLETE METABOLIC PANEL WITH GFR
AG Ratio: 1.7 (calc) (ref 1.0–2.5)
ALBUMIN MSPROF: 2.7 g/dL — AB (ref 3.6–5.1)
ALT: 15 U/L (ref 6–29)
AST: 25 U/L (ref 10–35)
Alkaline phosphatase (APISO): 106 U/L (ref 33–130)
BILIRUBIN TOTAL: 1.5 mg/dL — AB (ref 0.2–1.2)
BUN / CREAT RATIO: 19 (calc) (ref 6–22)
BUN: 26 mg/dL — ABNORMAL HIGH (ref 7–25)
CALCIUM: 8.3 mg/dL — AB (ref 8.6–10.4)
CO2: 26 mmol/L (ref 20–32)
Chloride: 104 mmol/L (ref 98–110)
Creat: 1.34 mg/dL — ABNORMAL HIGH (ref 0.60–0.93)
GFR, EST AFRICAN AMERICAN: 46 mL/min/{1.73_m2} — AB (ref >=60)
GFR, EST NON AFRICAN AMERICAN: 40 mL/min/{1.73_m2} — AB (ref >=60)
GLUCOSE: 83 mg/dL (ref 65–99)
Globulin: 1.6 g/dL (calc) — ABNORMAL LOW (ref 1.9–3.7)
Potassium: 4.6 mmol/L (ref 3.5–5.3)
Sodium: 136 mmol/L (ref 135–146)
TOTAL PROTEIN: 4.3 g/dL — AB (ref 6.1–8.1)

## 2018-03-20 ENCOUNTER — Other Ambulatory Visit: Payer: PPO | Admitting: Hospice and Palliative Medicine

## 2018-03-20 DIAGNOSIS — Z515 Encounter for palliative care: Secondary | ICD-10-CM

## 2018-03-20 NOTE — Progress Notes (Signed)
PALLIATIVE CARE CONSULT VISIT   PATIENT NAME: [name] DOB: 1946-03-23 MRN: [id number]  PRIMARY CARE PROVIDER:   Unk Pinto, MD  REFERRING PROVIDER:  Unk Pinto, MD [place],  [id number]  RESPONSIBLE PARTY:     ASSESSMENT:     I met with patient and her husband in the home. Patient lives at home with her husband. Husband is her primary caregiver. Patient has home health through [facility]. At baseline, she ambulates short distances with use of the walker. She needs significant assistance with most ADLs due to chronic dyspnea and fatigue. Husband says that PT has stopped coming due to failure to progress. Husband had a list of questions for me regarding hospice, palliative care, and home health. We talked at length regarding the differences between each of these services. I strongly recommended hospice involvement. However, he seemed to be leaning towards continuation of home health. Patient has an appointment later this month with a hepatologist.   Patient's husband seems to recognize that patient is at high risk of continued decline. However, he stated a desire to continue to pursue workup, treatment, blood infusions, antibiotics, and hospitalization if needed.   We talked about code status. Patient is a DNR and has a signed form in the home.  Ultimately, husband did not feel that there was a need for continued involvement of palliative care in the home. I left him my contact information and advised him to call if things change. Again, I also encouraged him to consider hospice.   At the time of my visit, patient denied being symptomatically different than her baseline. She had a home health visit earlier today and had 1L drained from peritoneal PleurX. She remains quite distended. However, weights have been stable over past several days at 196lbs.    RECOMMENDATIONS and PLAN:  1. DNR 2. Recommend hospice in the home  I spent 45  minutes providing this consultation,  from 1130 to 1215. More than 50% of the time in this consultation was spent coordinating communication.   HISTORY OF PRESENT ILLNESS:  [name] is a 72 y.o. year old female with multiple medical problems including stage IV COPD, diastolic dysfunction with h/o CHF, end stage cirrhosis from NASH, ascites, anasarca, CKD III, HTN, and IDA, who was hospitalized 03/10/2018-03/14/18 with worsening ascites and is s/p abdominal PleurX catheter placement. She was seen in consultation by PMT and agreed to DNR but was apparently reluctant to accept hospice. She was discharged home with home health. Palliative Care was asked to help address goals of care in the home.   CODE STATUS: DNR  PPS: 40% HOSPICE ELIGIBILITY/DIAGNOSIS: YES  PAST MEDICAL HISTORY:  Past Medical History:  Diagnosis Date  . Asthma   . Cancer [facility])    liver cancer   . COPD (chronic obstructive pulmonary disease) ([place])    x 3 years   . Depression   . Diabetes mellitus without complication ([place])    diet controlled  not on meds   . Edema    left leg   . Esophageal varices ([place])   . Fibromyalgia   . Gastritis   . GERD (gastroesophageal reflux disease)   . Hepatic cirrhosis ([place])   . Hepatic encephalopathy ([place])   . History of blood transfusion   . Hyperlipidemia   . Hypertension   . IBS (irritable bowel syndrome)   . Phlebitis    left leg x 2   . Pneumonia  hx of   . Splenomegaly   . Vitamin D deficiency     SOCIAL HX:  Social History   Tobacco Use  . Smoking status: Former Smoker    Packs/day: 1.00    Years: 50.00    Pack years: 50.00    Types: Cigarettes    Last attempt to quit: 11/05/2014    Years since quitting: 3.3  . Smokeless tobacco: Never Used  . Tobacco comment: Quit April 2016  Substance Use Topics  . Alcohol use: No    Alcohol/week: 0.0 standard drinks    ALLERGIES:  Allergies  Allergen Reactions  . Atorvastatin Other (See Comments)    Unknown  reaction  . Diphenhydramine Hcl (Sleep) Hives  . Hydrocodone-Acetaminophen Other (See Comments)    Unknown reaction  . Lopid [Gemfibrozil] Other (See Comments)    Unknown reaction  . Loratadine Hives  . Lorazepam Hives  . Simvastatin Other (See Comments)    Unknown reaction  . Sulfamethoxazole Hives  . Sulfonamide Derivatives Hives     PERTINENT MEDICATIONS:  Outpatient Encounter Medications as of 03/20/2018  Medication Sig  . albuterol (PROVENTIL HFA;VENTOLIN HFA) 108 (90 Base) MCG/ACT inhaler Inhale 2 puffs into the lungs every 6 (six) hours as needed for wheezing or shortness of breath.  . allopurinol (ZYLOPRIM) 300 MG tablet take 1 tablet (300 mg) by mouth daily with supper - for gout  . ALPRAZolam (XANAX) 0.25 MG tablet Take 1 tab as needed for anxiety, cramping, air hunger every 8 hours as needed.  . Cholecalciferol (VITAMIN D3) 5000 units CAPS Take 5,000 Units by mouth daily with breakfast.  . ciprofloxacin (CIPRO) 500 MG tablet Take 1 tablet (500 mg total) by mouth every Monday, Wednesday, and Friday for 10 days.  . citalopram (CELEXA) 20 MG tablet Take 1 tablet (20 mg total) by mouth daily.  . cyclobenzaprine (FLEXERIL) 10 MG tablet Take 1 tablet (10 mg total) by mouth 3 (three) times daily as needed for muscle spasms.  . ferrous sulfate 325 (65 FE) MG tablet Take 1 tablet (325 mg total) by mouth daily with breakfast.  . gabapentin (NEURONTIN) 600 MG tablet Take 1 tablet (600 mg total) by mouth at bedtime.  . Glycopyrrolate-Formoterol (BEVESPI AEROSPHERE) 9-4.8 MCG/ACT AERO Inhale 2 puffs into the lungs 2 (two) times daily.  Marland Kitchen ipratropium (ATROVENT) 0.02 % nebulizer solution Take 3 mLs by nebulization every 4 (four) hours as needed for wheezing or shortness of breath.  . lactulose (CHRONULAC) 10 GM/15ML solution take 28ms TWICE DAILY (Patient taking differently: Take 20 g by mouth daily. )  . Magnesium 250 MG TABS Take 250 mg by mouth at bedtime.   . metolazone (ZAROXOLYN) 5 MG  tablet Take 1 tablet daily or as directed for fluid Overload (Patient taking differently: Take 10 mg by mouth daily. )  . midodrine (PROAMATINE) 5 MG tablet Take 1 tablet (5 mg total) by mouth 3 (three) times daily with meals.  . ondansetron (ZOFRAN) 4 MG tablet Take 1 tablet (4 mg total) by mouth every 6 (six) hours as needed for nausea or vomiting.  . OXYGEN Inhale 2-3 L into the lungs continuous. 3 L with exertion  . pantoprazole (PROTONIX) 40 MG tablet Take 1 tablet (40 mg total) by mouth daily.  . potassium chloride (K-DUR) 10 MEQ tablet Take 1 tablet (10 mEq total) by mouth every Monday, Wednesday, and Friday.  . sodium chloride flush (NS) 0.9 % SOLN 3 mLs by Other route as needed. Peritoneal drain flush as  needed  . spironolactone (ALDACTONE) 25 MG tablet Take 1 tablet (25 mg total) by mouth 2 (two) times daily.  . traMADol (ULTRAM) 50 MG tablet Take 50 mg by mouth every 12 hours as needed for pain for chronic pain  . vitamin C (ASCORBIC ACID) 500 MG tablet Take 500 mg by mouth 3 (three) times daily.   No facility-administered encounter medications on file as of 03/20/2018.     PHYSICAL EXAM:   General: NAD, frail appearing, in chair Cardiovascular: regular rate and rhythm Pulmonary: diminished but clear, On O2 Abdomen: distended, firm Extremities: bilateral LE edema Skin: no rashes Neurological: Weakness but otherwise nonfocal  Irean Hong, NP

## 2018-03-21 ENCOUNTER — Other Ambulatory Visit: Payer: Self-pay | Admitting: Internal Medicine

## 2018-03-22 ENCOUNTER — Emergency Department (HOSPITAL_COMMUNITY): Payer: PPO

## 2018-03-22 ENCOUNTER — Emergency Department (HOSPITAL_COMMUNITY)
Admission: EM | Admit: 2018-03-22 | Discharge: 2018-03-23 | Disposition: A | Discharge status: Home / Self Care | Payer: PPO | Attending: Emergency Medicine | Admitting: Emergency Medicine

## 2018-03-22 ENCOUNTER — Encounter (HOSPITAL_COMMUNITY): Payer: Self-pay

## 2018-03-22 DIAGNOSIS — R0602 Shortness of breath: Secondary | ICD-10-CM | POA: Diagnosis not present

## 2018-03-22 DIAGNOSIS — J449 Chronic obstructive pulmonary disease, unspecified: Secondary | ICD-10-CM | POA: Insufficient documentation

## 2018-03-22 DIAGNOSIS — I130 Hypertensive heart and chronic kidney disease with heart failure and stage 1 through stage 4 chronic kidney disease, or unspecified chronic kidney disease: Secondary | ICD-10-CM | POA: Insufficient documentation

## 2018-03-22 DIAGNOSIS — Z79899 Other long term (current) drug therapy: Secondary | ICD-10-CM | POA: Diagnosis not present

## 2018-03-22 DIAGNOSIS — R188 Other ascites: Secondary | ICD-10-CM | POA: Diagnosis not present

## 2018-03-22 DIAGNOSIS — Z8505 Personal history of malignant neoplasm of liver: Secondary | ICD-10-CM | POA: Insufficient documentation

## 2018-03-22 DIAGNOSIS — Z96643 Presence of artificial hip joint, bilateral: Secondary | ICD-10-CM | POA: Diagnosis not present

## 2018-03-22 DIAGNOSIS — Z87891 Personal history of nicotine dependence: Secondary | ICD-10-CM | POA: Diagnosis not present

## 2018-03-22 DIAGNOSIS — R079 Chest pain, unspecified: Secondary | ICD-10-CM | POA: Diagnosis not present

## 2018-03-22 DIAGNOSIS — N183 Chronic kidney disease, stage 3 (moderate): Secondary | ICD-10-CM | POA: Insufficient documentation

## 2018-03-22 DIAGNOSIS — I5032 Chronic diastolic (congestive) heart failure: Secondary | ICD-10-CM | POA: Insufficient documentation

## 2018-03-22 DIAGNOSIS — E1122 Type 2 diabetes mellitus with diabetic chronic kidney disease: Secondary | ICD-10-CM | POA: Diagnosis not present

## 2018-03-22 DIAGNOSIS — R0600 Dyspnea, unspecified: Secondary | ICD-10-CM | POA: Insufficient documentation

## 2018-03-22 LAB — CBC
HEMATOCRIT: 31.1 % — AB (ref 36.0–46.0)
HEMOGLOBIN: 9.8 g/dL — AB (ref 12.0–15.0)
MCH: 33.8 pg (ref 26.0–34.0)
MCHC: 31.5 g/dL (ref 30.0–36.0)
MCV: 107.2 fL — ABNORMAL HIGH (ref 78.0–100.0)
Platelets: 136 10*3/uL — ABNORMAL LOW (ref 150–400)
RBC: 2.9 MIL/uL — ABNORMAL LOW (ref 3.87–5.11)
RDW: 16.3 % — ABNORMAL HIGH (ref 11.5–15.5)
WBC: 5.4 10*3/uL (ref 4.0–10.5)

## 2018-03-22 LAB — BASIC METABOLIC PANEL
ANION GAP: 7 (ref 5–15)
BUN: 20 mg/dL (ref 8–23)
CO2: 25 mmol/L (ref 22–32)
Calcium: 9.1 mg/dL (ref 8.9–10.3)
Chloride: 102 mmol/L (ref 98–111)
Creatinine, Ser: 1.43 mg/dL — ABNORMAL HIGH (ref 0.44–1.00)
GFR calc Af Amer: 42 mL/min — ABNORMAL LOW (ref >60)
GFR, EST NON AFRICAN AMERICAN: 36 mL/min — AB (ref >60)
GLUCOSE: 111 mg/dL — AB (ref 70–99)
Potassium: 4.6 mmol/L (ref 3.5–5.1)
Sodium: 134 mmol/L — ABNORMAL LOW (ref 135–145)

## 2018-03-22 LAB — I-STAT TROPONIN, ED: Troponin i, poc: 0.01 ng/mL (ref 0.00–0.08)

## 2018-03-22 NOTE — ED Provider Notes (Signed)
.  Paracentesis Date/Time: 03/22/2018 11:23 PM Performed by: Carlisle Cater, PA-C Authorized by: Carlisle Cater, PA-C   Consent:    Consent obtained:  Verbal   Consent given by:  Patient   Alternatives discussed:  No treatment Pre-procedure details:    Procedure purpose:  Therapeutic Anesthesia (see MAR for exact dosages):    Anesthesia method:  None Procedure details:    Fluid removed amount:  3.2 liters   Dressing:  4x4 sterile gauze and adhesive bandage Comments:     Pleural fluid was drained through pre-existing Pleurx catheter using sterile technique and provided kits. Patient had some abdominal and leg cramping towards end of procedure that was short-lived.       Carlisle Cater, PA-C 03/22/18 [id number]    Pattricia Boss, MD 03/23/18 [phone]

## 2018-03-22 NOTE — ED Provider Notes (Signed)
[facility] EMERGENCY DEPARTMENT Provider Note   CSN: [id number] Arrival date & time: 03/22/18  1638     History   Chief Complaint Chief Complaint  Patient presents with  . Chest Pain  . Shortness of Breath    HPI [name] is a 72 y.o. female.  HPI  72 yo female complaining of increased abdominal pressure, and dyspnea.  Patient with liver cirrhosis and ascites.  Increase pressure in abdomen last night and today.  Home health nurse pulled off 1 liter of fluid, but is not allowed to pull off more than one liter per two days per patietn and husband.  Patient was admitted for similar symptoms last week and had drain placed.  No fever, no chills.  Taking po ok.  States some decreased uop.Patient with history of copd, on 2.5 liters at home and uses inhaler and nebulizer.  No change in sputum.     Past Medical History:  Diagnosis Date  . Asthma   . Cancer [facility])    liver cancer   . COPD (chronic obstructive pulmonary disease) ([place])    x 3 years   . Depression   . Diabetes mellitus without complication ([place])    diet controlled  not on meds   . Edema    left leg   . Esophageal varices ([place])   . Fibromyalgia   . Gastritis   . GERD (gastroesophageal reflux disease)   . Hepatic cirrhosis ([place])   . Hepatic encephalopathy ([place])   . History of blood transfusion   . Hyperlipidemia   . Hypertension   . IBS (irritable bowel syndrome)   . Phlebitis    left leg x 2   . Pneumonia    hx of   . Splenomegaly   . Vitamin D deficiency     Patient Active Problem List   Diagnosis Date Noted  . Advanced care planning/counseling discussion   . Palliative care by specialist   . Shortness of breath 03/11/2018  . Abdominal pain 03/11/2018  . Hypervolemia   . Pressure injury of skin 02/27/2018  . Iron deficiency anemia due to chronic blood loss   . Occult blood in stools   . Acute blood loss anemia 01/19/2018  . Chronic respiratory failure with hypoxia and  hypercapnia ([place]) 12/26/2017  . Obesity hypoventilation syndrome ([place])   . Recurrent right pleural effusion   . NASH (nonalcoholic steatohepatitis)   . Liver cirrhosis secondary to nonalcoholic steatohepatitis (NASH) ([place])   . Chronic diastolic CHF (congestive heart failure) ([place])   . Macrocytic anemia   . CKD (chronic kidney disease), stage III ([place])   . Ascites 09/07/2017  . Hepatocellular carcinoma ([place]) 08/30/2017  . Anasarca 12/14/2016  . Obesity (BMI 30-39.9) 10/12/2016  . Hyperammonemia ([place]) 10/12/2016  . Atrial tachycardia, multifocal ([place])   . Chronic respiratory failure ([place]) 01/19/2016  . Hepatic encephalopathy ([place]) 09/21/2015  . Hypokalemia 09/21/2015  . COPD GOLD IV/ 02 dep  08/30/2015  . Thrombocytopenia ([place]) 08/07/2015  . Venous insufficiency 07/26/2015  . Goals of care, counseling/discussion 12/14/2013  . Essential hypertension   . Hyperlipidemia   . GERD   . Vitamin D deficiency   . IBS   . Fibromyalgia   . Esophageal varices ([place]) 07/08/2012  . COLONIC POLYPS 11/11/2008  . Symptomatic anemia 05/28/2008  . Esophagitis 05/27/2008  . Diverticulosis of large intestine 05/27/2008    Past Surgical History:  Procedure Laterality Date  . ABDOMINAL HYSTERECTOMY    .  BACK SURGERY    . CATARACT EXTRACTION, BILATERAL    . CHOLECYSTECTOMY    . ESOPHAGOGASTRODUODENOSCOPY  07/16/2012   Procedure: ESOPHAGOGASTRODUODENOSCOPY (EGD);  Surgeon: Inda Castle, MD;  Location: [facility];  Service: Endoscopy;  Laterality: N/A;  . ESOPHAGOGASTRODUODENOSCOPY N/A 01/20/2018   Procedure: ESOPHAGOGASTRODUODENOSCOPY (EGD);  Surgeon: Ladene Artist, MD;  Location: [facility] ENDOSCOPY;  Service: Endoscopy;  Laterality: N/A;  . GASTRIC VARICES BANDING  07/16/2012   Procedure: GASTRIC VARICES BANDING;  Surgeon: Inda Castle, MD;  Location: [facility] ENDOSCOPY;  Service: Endoscopy;  Laterality: N/A;  . HIP ARTHROPLASTY Bilateral   . IR GENERIC HISTORICAL  06/25/2016   IR RADIOLOGIST EVAL  & MGMT 06/25/2016 [facility]INTER[facility]  . IR GENERIC HISTORICAL  06/27/2016   IR KYPHO LUMBAR INC FX REDUCE BONE BX UNI/BIL CANNULATION INC/IMAGING 06/27/2016 Luanne Bras, MD [facility]  . IR GENERIC HISTORICAL  07/17/2016   IR RADIOLOGIST EVAL & MGMT 07/17/2016 [facility]  . IR PARACENTESIS  01/20/2018  . IR PARACENTESIS  02/27/2018  . IR PARACENTESIS  03/11/2018  . IR PERC TUN PERIT CATH WO PORT S&I Dartha Lodge  03/14/2018  . IR RADIOLOGIST EVAL & MGMT  07/10/2017  . IR RADIOLOGIST EVAL & MGMT  10/01/2017  . IR RADIOLOGIST EVAL & MGMT  12/11/2017  . IR THORACENTESIS ASP PLEURAL SPACE W/IMG GUIDE  09/12/2017  . IR THORACENTESIS ASP PLEURAL SPACE W/IMG GUIDE  09/17/2017  . NECK SURGERY    . RADIOFREQUENCY ABLATION N/A 08/30/2017   Procedure: CT MICROWAVE THERMAL ABLATION;  Surgeon: Arne Cleveland, MD;  Location: [facility] ORS;  Service: Anesthesiology;  Laterality: N/A;  . SHOULDER SURGERY Right      OB History   None      Home Medications    Prior to Admission medications   Medication Sig Start Date End Date Taking? Authorizing Provider  albuterol (PROVENTIL HFA;VENTOLIN HFA) 108 (90 Base) MCG/ACT inhaler Inhale 2 puffs into the lungs every 6 (six) hours as needed for wheezing or shortness of breath. 05/27/16   Ward, Delice Bison, DO  allopurinol (ZYLOPRIM) 300 MG tablet take 1 tablet (300 mg) by mouth daily with supper - for gout 03/04/18   Unk Pinto, MD  ALPRAZolam Duanne Moron) 0.25 MG tablet Take 1 tab as needed for anxiety, cramping, air hunger every 8 hours as needed. 11/01/17   Liane Comber, NP  Cholecalciferol (VITAMIN D3) 5000 units CAPS Take 5,000 Units by mouth daily with breakfast.    [provider]  ciprofloxacin (CIPRO) 500 MG tablet Take 1 tablet (500 mg total) by mouth every Monday, Wednesday, and Friday for 10 days. 03/14/18 03/24/18  Florencia Reasons, MD  citalopram (CELEXA) 20 MG tablet Take 1 tablet (20 mg total) by mouth daily. 03/14/18 03/14/19  Florencia Reasons, MD  cyclobenzaprine  (FLEXERIL) 10 MG tablet Take 1 tablet (10 mg total) by mouth 3 (three) times daily as needed for muscle spasms. 03/06/16   Unk Pinto, MD  ferrous sulfate 325 (65 FE) MG tablet Take 1 tablet (325 mg total) by mouth daily with breakfast. 03/01/18   Florencia Reasons, MD  gabapentin (NEURONTIN) 600 MG tablet Take 1 tablet (600 mg total) by mouth at bedtime. 03/14/18   Florencia Reasons, MD  Glycopyrrolate-Formoterol (BEVESPI AEROSPHERE) 9-4.8 MCG/ACT AERO Inhale 2 puffs into the lungs 2 (two) times daily. 12/25/17   Tanda Rockers, MD  ipratropium (ATROVENT) 0.02 % nebulizer solution Take 3 mLs by nebulization every 4 (four) hours as needed for wheezing or shortness of breath.  01/29/18   [provider]  lactulose (CHRONULAC) 10 GM/15ML solution take 16ms TWICE DAILY Patient taking differently: Take 20 g by mouth daily.  08/08/17   MUnk Pinto MD  Magnesium 250 MG TABS Take 250 mg by mouth at bedtime.     [provider]  metolazone (ZAROXOLYN) 5 MG tablet TAKE 1 TABLET DAILY OR AS DIRECTED FOR FLUID OVERLOAD 03/21/18   CLiane Comber NP  midodrine (PROAMATINE) 5 MG tablet Take 1 tablet (5 mg total) by mouth 3 (three) times daily with meals. 03/01/18   XFlorencia Reasons MD  ondansetron (ZOFRAN) 4 MG tablet Take 1 tablet (4 mg total) by mouth every 6 (six) hours as needed for nausea or vomiting. 09/02/17   CVicie Mutters PA-C  OXYGEN Inhale 2-3 L into the lungs continuous. 3 L with exertion    [provider]  pantoprazole (PROTONIX) 40 MG tablet Take 1 tablet (40 mg total) by mouth daily. 03/04/18   MUnk Pinto MD  potassium chloride (K-DUR) 10 MEQ tablet Take 1 tablet (10 mEq total) by mouth every Monday, Wednesday, and Friday. 03/14/18   XFlorencia Reasons MD  sodium chloride flush (NS) 0.9 % SOLN 3 mLs by Other route as needed. Peritoneal drain flush as needed 03/14/18 04/13/18  XFlorencia Reasons MD  spironolactone (ALDACTONE) 25 MG tablet Take 1 tablet (25 mg total) by mouth 2 (two) times daily. 03/01/18   XFlorencia Reasons MD  traMADol (Veatrice Bourbon 50 MG tablet Take 50 mg by mouth every 12 hours as needed for pain for chronic pain 03/03/18   CLiane Comber NP  vitamin C (ASCORBIC ACID) 500 MG tablet Take 500 mg by mouth 3 (three) times daily.    [provider]    Family History Family History  Problem Relation Age of Onset  . Diabetes Brother        deceased  . Heart disease Sister        A Fib  . Heart disease Mother        CHF  . Atrial fibrillation Sister   . Cancer Father        lung cancer   . Cancer Maternal Uncle        unknown type cancer   . Cancer Other        lung cancer   . Colon cancer Neg Hx     Social History Social History   Tobacco Use  . Smoking status: Former Smoker    Packs/day: 1.00    Years: 50.00    Pack years: 50.00    Types: Cigarettes    Last attempt to quit: 11/05/2014    Years since quitting: 3.3  . Smokeless tobacco: Never Used  . Tobacco comment: Quit April 2016  Substance Use Topics  . Alcohol use: No    Alcohol/week: 0.0 standard drinks  . Drug use: No     Allergies   Atorvastatin; Diphenhydramine hcl (sleep); Hydrocodone-acetaminophen; Lopid [gemfibrozil]; Loratadine; Lorazepam; Simvastatin; Sulfamethoxazole; and Sulfonamide derivatives   Review of Systems Review of Systems   Physical Exam Updated Vital Signs BP (!) 149/59   Pulse 82   Temp 98.1 F (36.7 C) (Oral)   Resp 18   SpO2 100%   Physical Exam   ED Treatments / Results  Labs (all labs ordered are listed, but only abnormal results are displayed) Labs Reviewed  BASIC METABOLIC PANEL - Abnormal; Notable for the following components:      Result Value   Sodium 134 (*)  Glucose, Bld 111 (*)    Creatinine, Ser 1.43 (*)    GFR calc non Af Amer 36 (*)    GFR calc Af Amer 42 (*)    All other components within normal limits  CBC - Abnormal; Notable for the following components:   RBC 2.90 (*)    Hemoglobin 9.8 (*)    HCT 31.1 (*)    MCV 107.2 (*)    RDW 16.3 (*)     Platelets 136 (*)    All other components within normal limits  I-STAT TROPONIN, ED    EKG EKG Interpretation  Date/Time:  Saturday March 22 2018 16:42:34 EDT Ventricular Rate:  88 PR Interval:  108 QRS Duration: 122 QT Interval:  392 QTC Calculation: 474 R Axis:   30 Text Interpretation:  Sinus rhythm with short PR Right bundle branch block Abnormal ECG No significant change since last tracing Confirmed by Pattricia Boss [phone]) on 03/22/2018 6:42:37 PM   Radiology Dg Chest 2 View  Result Date: 03/22/2018 CLINICAL DATA:  Chest pain and shortness of breath EXAM: CHEST - 2 VIEW COMPARISON:  March 10, 2018 FINDINGS: There is a minimal pleural effusion on the left. There is bibasilar atelectasis. There is no frank consolidation. There are small probable granulomas in the right upper lobe. Lungs elsewhere are clear. Heart size and pulmonary vascularity are normal. No adenopathy. There is aortic atherosclerosis. There is a total shoulder replacement right. There is postoperative change in the lower cervical region. Patient is status post kyphoplasty at L1. IMPRESSION: Bibasilar atelectasis with minimal pleural effusion on the left. Small apparent granulomas right upper lobe. No consolidation. Stable cardiac silhouette. There is aortic atherosclerosis. Aortic Atherosclerosis (ICD10-I70.0). Electronically Signed   By: Lowella Grip III M.D.   On: 03/22/2018 17:56    Procedures Procedures (including critical care time)  Medications Ordered in ED Medications - No data to display   Initial Impression / Assessment and Plan / ED Course  I have reviewed the triage vital signs and the nursing notes.  Pertinent labs & imaging results that were available during my care of the patient were reviewed by me and considered in my medical decision making (see chart for details).     72 year old female with known normal ascites who presents today with dyspnea and distended abdomen.  Here she had  3 L of fluid removed and feels improved.  Her sats have been normal.  Her vital signs have remained stable. Vitals:   03/22/18 2300 03/22/18 2315  BP: (!) 156/43 (!) 133/52  Pulse: 93 92  Resp: 13 11  Temp:    SpO2: 98% 99%     Final Clinical Impressions(s) / ED Diagnoses   Final diagnoses:  Dyspnea, unspecified type  Other ascites    ED Discharge Orders    None       Pattricia Boss, MD 03/23/18 0003

## 2018-03-22 NOTE — ED Triage Notes (Signed)
Home health pulled 1L today.  Pt reports not enough fluid was pulled off, c/o chest pain, abd pain, and shortness of breath.  On home O2 @ 2.5 -3 L via [place]. Abd distended, firm.

## 2018-03-23 NOTE — Discharge Instructions (Addendum)
Please return if you are worse at any time.  Three liters of fluid were removed tonight

## 2018-03-24 ENCOUNTER — Ambulatory Visit: Payer: Self-pay

## 2018-03-24 ENCOUNTER — Other Ambulatory Visit: Payer: Self-pay

## 2018-03-24 NOTE — Patient Outreach (Signed)
[facility]) Care Management  03/24/2018  [name] 04/13/1946 [id number]  72 year old female outreached by [place] services requested for 30 day post discharge medication review.   PMHx includes, but not limited to, esophageal varices, hypertension, diastolic heart failure, COPD GOLD IV, GERD and hyperlipidemia.   Successful outreach attempt to [name]'s husband.   He declined [place] [facility] services to complete a discharge medication review for his wife.  [name], PharmD Clinical Pharmacist [place] [phone]

## 2018-03-26 ENCOUNTER — Telehealth: Payer: Self-pay | Admitting: *Deleted

## 2018-03-26 ENCOUNTER — Telehealth: Payer: Self-pay | Admitting: Gastroenterology

## 2018-03-26 NOTE — Telephone Encounter (Signed)
Thanks Almyra Free, I had thought this decision for hospice was discussed during recent inpatient stay, but I think it is appropriate for this patient. I had recently messaged Dr. Melford Aase about it. Can you help make the referral if it is needed. Otherwise, in regards to making her comfortable with the peritoneal catheter, they can drain 2-3 L every other day or so as needed to see if that helps (previously was at 1L every other day which was not helping). Thanks

## 2018-03-26 NOTE — Telephone Encounter (Signed)
Patient has been referred to hospice, they will go out tomorrow to get admission completed. I asked if anyone had given orders for the pleurx catheter and they have not received those orders. I will fax them over to [facility] at [phone].

## 2018-03-26 NOTE — Telephone Encounter (Signed)
[facility] home health called and reported the patient has requested Hospice services. Per Dr Melford Aase, Call [facility] to make the referral.  Home Health is aware referral will be made.

## 2018-03-26 NOTE — Telephone Encounter (Signed)
A referral was made to [facility] for services. The referral representative states she will be able to get the information from the patient's Epic chart, but she will call back if she needs further information.

## 2018-03-26 NOTE — Telephone Encounter (Signed)
Please advise 

## 2018-03-26 NOTE — Telephone Encounter (Signed)
Thanks Almyra Free, Please see other message I sent you about this and have sent Dr. Melford Aase. Referral being made for hospice. We can still see her this week to discuss things if she still wants to come in, up to her.

## 2018-03-26 NOTE — Telephone Encounter (Signed)
Spoke to [facility] nurse, Vaughan Basta, about the pleurx catheter they are draining 1000 cc every other day, patient had 1000 cc drained on Saturday by [facility] nurse and ended up going to ED same day for another 3000 cc taken off. Nurse states very little urine output, patient is taking medications as prescribed. The home health agency is now getting push back from insurance to cut back on their visits. The nurse is asking that some very frank discussions take place at patient visit on Friday about transitioning to hospice. She is scheduled to see AE.

## 2018-03-27 DIAGNOSIS — J9610 Chronic respiratory failure, unspecified whether with hypoxia or hypercapnia: Secondary | ICD-10-CM | POA: Diagnosis not present

## 2018-03-27 DIAGNOSIS — J449 Chronic obstructive pulmonary disease, unspecified: Secondary | ICD-10-CM | POA: Diagnosis not present

## 2018-03-27 DIAGNOSIS — J441 Chronic obstructive pulmonary disease with (acute) exacerbation: Secondary | ICD-10-CM | POA: Diagnosis not present

## 2018-03-28 ENCOUNTER — Ambulatory Visit (INDEPENDENT_AMBULATORY_CARE_PROVIDER_SITE_OTHER): Payer: PPO | Admitting: Physician Assistant

## 2018-03-28 ENCOUNTER — Encounter: Payer: Self-pay | Admitting: Physician Assistant

## 2018-03-28 ENCOUNTER — Other Ambulatory Visit (INDEPENDENT_AMBULATORY_CARE_PROVIDER_SITE_OTHER): Payer: PPO

## 2018-03-28 VITALS — BP 130/50 | HR 93 | Ht 62.0 in | Wt 192.0 lb

## 2018-03-28 DIAGNOSIS — K7682 Hepatic encephalopathy: Secondary | ICD-10-CM

## 2018-03-28 DIAGNOSIS — K7460 Unspecified cirrhosis of liver: Secondary | ICD-10-CM

## 2018-03-28 DIAGNOSIS — R188 Other ascites: Secondary | ICD-10-CM | POA: Diagnosis not present

## 2018-03-28 DIAGNOSIS — K7290 Hepatic failure, unspecified without coma: Secondary | ICD-10-CM

## 2018-03-28 LAB — BASIC METABOLIC PANEL
BUN: 24 mg/dL — ABNORMAL HIGH (ref 6–23)
CALCIUM: 9.3 mg/dL (ref 8.4–10.5)
CO2: 28 meq/L (ref 19–32)
CREATININE: 1.18 mg/dL (ref 0.40–1.20)
Chloride: 101 mEq/L (ref 96–112)
GFR: 47.94 mL/min — ABNORMAL LOW (ref >60.00)
Glucose, Bld: 88 mg/dL (ref 70–99)
Potassium: 4.3 mEq/L (ref 3.5–5.1)
Sodium: 133 mEq/L — ABNORMAL LOW (ref 135–145)

## 2018-03-28 LAB — AMMONIA: AMMONIA: 51 umol/L — AB (ref 11–35)

## 2018-03-28 NOTE — Patient Instructions (Addendum)
No med changes today.   Follow up with Dr. Havery Moros as needed.

## 2018-03-28 NOTE — Progress Notes (Signed)
Subjective:    Patient ID: [name], female    DOB: 1946/06/04, 72 y.o.   MRN: [id number]  HPI [name] is a pleasant 72 year old white female, known to Dr. Havery Moros with decompensated [name] cirrhosis, and history of hepatocellular CA status post ablation January 2019.  She also has chronic kidney disease, chronic hepatic encephalopathy, congestive heart failure and COPD Gold 4 on chronic oxygen. She has had a difficult year with overall decline in status over the past several months. She was last seen in the office about a month ago in post hospital follow-up after an admission for GI bleeding.  EGD at that time showed grade 1 esophageal varices and portal gastropathy.  She did require transfusions. She had readmission 8/6 through 03/14/2018 with anasarca.  She had been requiring increased frequency of paracenteses.  Decision was made during that admission to pursue hospice care and place a Pleurx catheter management of her ascites on an outpatient basis.  She has done well after placement of the catheter.  Initially she was having 1 L removed every other day by home health.  She did require ER visit this past weekend due to increase in ascites symptomatic with shortness of breath.  She had an additional 3 L removed that day in the ER.  He has just transition to hospice care this week.  Her orders have been changed to allow removal of 2 to 3 L of ascitic fluid every other day for comfort.  She and her husband say she had 2 L removed earlier this afternoon. She is currently appearing quite comfortable and is more alert and conversant than I have seen her in some time.  She has been placed on Cipro 500 mg 3 times weekly to prevent SBP.  Her husband feels she is doing well with her current regimen for hepatic encephalopathy on generic Xifaxan and lactulose 30 cc twice daily and having 2-3 bowel movements per day. Husband is comfortable with hospice at this time and states that Dr. Melford Aase  her PCP  plans to monitor labs at least once monthly and they  have follow-up with him in about a week and a half.  Last labs done 03/22/2018 creatinine of 1.43, sodium 134, WBC 5.4, hemoglobin 9.8 hematocrit of 31.1 and platelets of 136.  Review of Systems Pertinent positive and negative review of systems were noted in the above HPI section.  All other review of systems was otherwise negative.  Outpatient Encounter Medications as of 03/28/2018  Medication Sig  . albuterol (PROVENTIL HFA;VENTOLIN HFA) 108 (90 Base) MCG/ACT inhaler Inhale 2 puffs into the lungs every 6 (six) hours as needed for wheezing or shortness of breath.  . allopurinol (ZYLOPRIM) 300 MG tablet take 1 tablet (300 mg) by mouth daily with supper - for gout  . ALPRAZolam (XANAX) 0.25 MG tablet Take 1 tab as needed for anxiety, cramping, air hunger every 8 hours as needed.  . Cholecalciferol (VITAMIN D3) 5000 units CAPS Take 5,000 Units by mouth daily with breakfast.  . ciprofloxacin (CIPRO) 500 MG tablet Take 500 mg by mouth. Take one tablet three times a week  . citalopram (CELEXA) 20 MG tablet Take 1 tablet (20 mg total) by mouth daily.  . cyclobenzaprine (FLEXERIL) 10 MG tablet Take 1 tablet (10 mg total) by mouth 3 (three) times daily as needed for muscle spasms.  . ferrous sulfate 325 (65 FE) MG tablet Take 1 tablet (325 mg total) by mouth daily with breakfast.  . gabapentin (NEURONTIN)  600 MG tablet Take 1 tablet (600 mg total) by mouth at bedtime.  . Glycopyrrolate-Formoterol (BEVESPI AEROSPHERE) 9-4.8 MCG/ACT AERO Inhale 2 puffs into the lungs 2 (two) times daily.  Marland Kitchen ipratropium (ATROVENT) 0.02 % nebulizer solution Take 3 mLs by nebulization every 4 (four) hours as needed for wheezing or shortness of breath.  . lactulose (CHRONULAC) 10 GM/15ML solution take 41ms TWICE DAILY (Patient taking differently: Take 20 g by mouth daily. )  . Magnesium 250 MG TABS Take 250 mg by mouth at bedtime.   . metolazone (ZAROXOLYN) 5 MG tablet TAKE  1 TABLET DAILY OR AS DIRECTED FOR FLUID OVERLOAD  . midodrine (PROAMATINE) 5 MG tablet Take 1 tablet (5 mg total) by mouth 3 (three) times daily with meals.  . ondansetron (ZOFRAN) 4 MG tablet Take 1 tablet (4 mg total) by mouth every 6 (six) hours as needed for nausea or vomiting.  . OXYGEN Inhale 2-3 L into the lungs continuous. 3 L with exertion  . pantoprazole (PROTONIX) 40 MG tablet Take 1 tablet (40 mg total) by mouth daily.  . potassium chloride (K-DUR) 10 MEQ tablet Take 1 tablet (10 mEq total) by mouth every Monday, Wednesday, and Friday.  . rifampin (RIFADIN) 300 MG capsule Take 300 mg by mouth 2 (two) times daily.  . sodium chloride flush (NS) 0.9 % SOLN 3 mLs by Other route as needed. Peritoneal drain flush as needed  . spironolactone (ALDACTONE) 25 MG tablet Take 1 tablet (25 mg total) by mouth 2 (two) times daily.  . traMADol (ULTRAM) 50 MG tablet Take 50 mg by mouth every 12 hours as needed for pain for chronic pain  . vitamin C (ASCORBIC ACID) 500 MG tablet Take 500 mg by mouth 3 (three) times daily.   No facility-administered encounter medications on file as of 03/28/2018.    Allergies  Allergen Reactions  . Atorvastatin Other (See Comments)    Unknown reaction  . Diphenhydramine Hcl (Sleep) Hives  . Hydrocodone-Acetaminophen Other (See Comments)    Unknown reaction  . Lopid [Gemfibrozil] Other (See Comments)    Unknown reaction  . Loratadine Hives  . Lorazepam Hives  . Simvastatin Other (See Comments)    Unknown reaction  . Sulfamethoxazole Hives  . Sulfonamide Derivatives Hives   Patient Active Problem List   Diagnosis Date Noted  . Advanced care planning/counseling discussion   . Palliative care by specialist   . Shortness of breath 03/11/2018  . Abdominal pain 03/11/2018  . Hypervolemia   . Pressure injury of skin 02/27/2018  . Iron deficiency anemia due to chronic blood loss   . Occult blood in stools   . Acute blood loss anemia 01/19/2018  . Chronic  respiratory failure with hypoxia and hypercapnia ([facility] 12/26/2017  . Obesity hypoventilation syndrome ([facility]   . Recurrent right pleural effusion   . NASH (nonalcoholic steatohepatitis)   . Liver cirrhosis secondary to nonalcoholic steatohepatitis (NASH) ([facility] [facility]   . Chronic diastolic CHF (congestive heart failure) ([facility]   . Macrocytic anemia   . CKD (chronic kidney disease), stage III ([facility]   . Ascites 09/07/2017  . Hepatocellular carcinoma ([facility] [place] 08/30/2017  . Anasarca 12/14/2016  . Obesity (BMI 30-39.9) 10/12/2016  . Hyperammonemia ([facility] 10/12/2016  . Atrial tachycardia, multifocal ([facility]   . Chronic respiratory failure ([facility] 01/19/2016  . Hepatic encephalopathy ([facility] 09/21/2015  . Hypokalemia 09/21/2015  . COPD GOLD IV/ 02 dep  08/30/2015  . Thrombocytopenia ([facility] 08/07/2015  . Venous insufficiency 07/26/2015  . Goals of  care, counseling/discussion 12/14/2013  . Essential hypertension   . Hyperlipidemia   . GERD   . Vitamin D deficiency   . IBS   . Fibromyalgia   . Esophageal varices ([place]) 07/08/2012  . COLONIC POLYPS 11/11/2008  . Symptomatic anemia 05/28/2008  . Esophagitis 05/27/2008  . Diverticulosis of large intestine 05/27/2008   Social History   Socioeconomic History  . Marital status: Married    Spouse name: [name]  . Number of children: 5  . Years of education: Not on file  . Highest education level: Not on file  Occupational History  . Occupation: Arboriculturist: UNEMPLOYED  Social Needs  . Financial resource strain: Not on file  . Food insecurity:    Worry: Not on file    Inability: Not on file  . Transportation needs:    Medical: Not on file    Non-medical: Not on file  Tobacco Use  . Smoking status: Former Smoker    Packs/day: 1.00    Years: 50.00    Pack years: 50.00    Types: Cigarettes    Last attempt to quit: 11/05/2014    Years since quitting: 3.3  . Smokeless tobacco: Never Used  . Tobacco comment: Quit April 2016  Substance and  Sexual Activity  . Alcohol use: No    Alcohol/week: 0.0 standard drinks  . Drug use: No  . Sexual activity: Not on file  Lifestyle  . Physical activity:    Days per week: Not on file    Minutes per session: Not on file  . Stress: Not on file  Relationships  . Social connections:    Talks on phone: Not on file    Gets together: Not on file    Attends religious service: Not on file    Active member of club or organization: Not on file    Attends meetings of clubs or organizations: Not on file    Relationship status: Not on file  . Intimate partner violence:    Fear of current or ex partner: Not on file    Emotionally abused: Not on file    Physically abused: Not on file    Forced sexual activity: Not on file  Other Topics Concern  . Not on file  Social History Narrative  . Not on file    Ms. Christner's family history includes Atrial fibrillation in her sister; Cancer in her father, maternal uncle, and other; Diabetes in her brother; Heart disease in her mother and sister.      Objective:    Vitals:   03/28/18 1432  BP: (!) 130/50  Pulse: 93    Physical Exam; well-developed older white female, chronically ill-appearing in a wheelchair on Oxygen pleasant accompanied by her husband and a grandchild.  Blood pressure 130/50 pulse 93, height 5 foot 2, weight 192, BMI 35 HEENT; nontraumatic normocephalic EOMI PERRLA sclera anicteric oral mucosa moist.  Cardiovascular ;regular rate and rhythm with S1-S2 no murmur rub or gallop, Pulmonary; decreased breath sounds bilaterally but clear, Abdomen ;protuberant, significant ascites but non-tense, nontender, bowel sounds are present, Pleurex catheter present in the right lower quadrant, Rectal; exam not done, Extremities ;1-2+ edema bilateral lower extremities, Neuro psych; patient is alert, oriented, fairly bright today and conversant no asterixis       Assessment & Plan:   #46 72 year old white female with severely decompensated Nash  cirrhosis complicated by chronic hepatic encephalopathy, recent GI bleeding secondary to portal gastropathy, and now with refractory ascites.  Patient had readmission about 2 weeks ago with anasarca.  Palliative care consultation was pursued, and decision was made to place a Pleurx catheter to allow management of her ascites on an outpatient basis for comfort. Initially she was to have thousand cc mood removed every other day as needed.  She required ER visit over this past weekend due to increase in ascites and shortness of breath and required an additional 3 L paracentesis. He has now transition to hospice, and plan is for removal of 2 to 3 L every other day as needed for comfort.  Patient looks good today and is comfortable.  She is mentating as well as I have seen her.  2. congestive heart failure 3.  Hepatocellular CA status post ablation January 2019 4.  Chronic kidney disease stage III 5.  COPD Gold 4 with chronic respiratory failure on chronic O2  Plan; Continue her current medication regimen-no changes today She is now on Cipro 500 mg 3 times weekly to prevent SBP Hospice will continue to remove 2 to 3 L of ascitic fluid every other day as needed for comfort Check BMET, and venous ammonia today Dr. Melford Aase  patient's PCP plans for monitoring of labs monthly, and follow-up will be through Dr. Melford Aase .  Patient and husband are content with this plan. We are happy to see her as needed .  Sahalie Beth Genia Harold PA-C 03/28/2018   Cc: Unk Pinto, MD

## 2018-03-31 ENCOUNTER — Other Ambulatory Visit: Payer: Self-pay | Admitting: Internal Medicine

## 2018-03-31 DIAGNOSIS — I10 Essential (primary) hypertension: Secondary | ICD-10-CM

## 2018-03-31 MED ORDER — MIDODRINE HCL 5 MG PO TABS: ORAL_TABLET | ORAL | 3 refills | Status: AC

## 2018-03-31 NOTE — Progress Notes (Signed)
Agree with assessment and plan as outlined.  

## 2018-04-08 ENCOUNTER — Ambulatory Visit (INDEPENDENT_AMBULATORY_CARE_PROVIDER_SITE_OTHER): Payer: PPO | Admitting: Internal Medicine

## 2018-04-08 ENCOUNTER — Encounter: Payer: Self-pay | Admitting: Internal Medicine

## 2018-04-08 VITALS — BP 140/52 | HR 80 | Temp 97.0°F | Resp 18 | Ht 62.0 in | Wt 185.0 lb

## 2018-04-08 DIAGNOSIS — I10 Essential (primary) hypertension: Secondary | ICD-10-CM

## 2018-04-08 DIAGNOSIS — Z79899 Other long term (current) drug therapy: Secondary | ICD-10-CM

## 2018-04-08 DIAGNOSIS — R252 Cramp and spasm: Secondary | ICD-10-CM | POA: Diagnosis not present

## 2018-04-08 DIAGNOSIS — K7581 Nonalcoholic steatohepatitis (NASH): Secondary | ICD-10-CM | POA: Diagnosis not present

## 2018-04-08 DIAGNOSIS — K7460 Unspecified cirrhosis of liver: Secondary | ICD-10-CM

## 2018-04-08 DIAGNOSIS — R601 Generalized edema: Secondary | ICD-10-CM

## 2018-04-08 DIAGNOSIS — J449 Chronic obstructive pulmonary disease, unspecified: Secondary | ICD-10-CM

## 2018-04-08 DIAGNOSIS — R188 Other ascites: Secondary | ICD-10-CM | POA: Diagnosis not present

## 2018-04-08 DIAGNOSIS — D649 Anemia, unspecified: Secondary | ICD-10-CM | POA: Diagnosis not present

## 2018-04-08 NOTE — Progress Notes (Signed)
Subjective:    Patient ID: [name], female    DOB: [birth year], 72 y.o.   MRN: [id number]  HPI   This 72 yo MWF with End stage O2 Dependent COPD and decompensated NASH/cirrhosis with progressive ascites was recently admitted to Hospice and is drained 2 liters of ascites every other day for comfort. Husband still seems in denial and to have limited insight into her prognosis  Talking about purchasing a BiPAP device over 18 months. Patient for the most apart seems comfortable. She is not jaundiced and denies and N/V or other GI sx's.   Medication Sig  . albuterol (PROVENTIL HFA;VENTOLIN HFA) 108 (90 Base) MCG/ACT inhaler Inhale 2 puffs into the lungs every 6 (six) hours as needed for wheezing or shortness of breath.  . allopurinol (ZYLOPRIM) 300 MG tablet take 1 tablet (300 mg) by mouth daily with supper - for gout  . ALPRAZolam (XANAX) 0.25 MG tablet Take 1 tab as needed for anxiety, cramping, air hunger every 8 hours as needed.  . Cholecalciferol (VITAMIN D3) 5000 units CAPS Take 5,000 Units by mouth daily with breakfast.  . ciprofloxacin (CIPRO) 500 MG tablet Take 500 mg by mouth. Take one tablet three times a week  . citalopram (CELEXA) 20 MG tablet Take 1 tablet (20 mg total) by mouth daily.  . cyclobenzaprine (FLEXERIL) 10 MG tablet Take 1 tablet (10 mg total) by mouth 3 (three) times daily as needed for muscle spasms.  . ferrous sulfate 325 (65 FE) MG tablet Take 1 tablet (325 mg total) by mouth daily with breakfast.  . gabapentin (NEURONTIN) 600 MG tablet Take 1 tablet (600 mg total) by mouth at bedtime.  . Glycopyrrolate-Formoterol (BEVESPI AEROSPHERE) 9-4.8 MCG/ACT AERO Inhale 2 puffs into the lungs 2 (two) times daily.  Marland Kitchen ipratropium (ATROVENT) 0.02 % nebulizer solution Take 3 mLs by nebulization every 4 (four) hours as needed for wheezing or shortness of breath.  . lactulose (CHRONULAC) 10 GM/15ML solution take 60ms TWICE DAILY (Patient taking differently: Take 20 g by mouth  daily. )  . Magnesium 250 MG TABS Take 250 mg by mouth at bedtime.   . metolazone (ZAROXOLYN) 5 MG tablet TAKE 1 TABLET DAILY OR AS DIRECTED FOR FLUID OVERLOAD  . midodrine (PROAMATINE) 5 MG tablet Take 1 tablet 3 x /day with meals  . ondansetron (ZOFRAN) 4 MG tablet Take 1 tablet (4 mg total) by mouth every 6 (six) hours as needed for nausea or vomiting.  . OXYGEN Inhale 2-3 L into the lungs continuous. 3 L with exertion  . pantoprazole (PROTONIX) 40 MG tablet Take 1 tablet (40 mg total) by mouth daily.  . potassium chloride (K-DUR) 10 MEQ tablet Take 1 tablet (10 mEq total) by mouth every Monday, Wednesday, and Friday.  . rifampin (RIFADIN) 300 MG capsule Take 300 mg by mouth 2 (two) times daily.  . sodium chloride flush (NS) 0.9 % SOLN 3 mLs by Other route as needed. Peritoneal drain flush as needed  . spironolactone (ALDACTONE) 25 MG tablet Take 1 tablet (25 mg total) by mouth 2 (two) times daily.  . traMADol (ULTRAM) 50 MG tablet Take 50 mg by mouth every 12 hours as needed for pain for chronic pain  . vitamin C (ASCORBIC ACID) 500 MG tablet Take 500 mg by mouth 3 (three) times daily.   Allergies  Allergen Reactions  . Atorvastatin Other (See Comments)    Unknown reaction  . Diphenhydramine Hcl (Sleep) Hives  . Hydrocodone-Acetaminophen Other (See Comments)  Unknown reaction  . Lopid [Gemfibrozil] Other (See Comments)    Unknown reaction  . Loratadine Hives  . Lorazepam Hives  . Simvastatin Other (See Comments)    Unknown reaction  . Sulfamethoxazole Hives  . Sulfonamide Derivatives Hives   Past Medical History:  Diagnosis Date  . Asthma   . Cancer [facility])    liver cancer   . COPD (chronic obstructive pulmonary disease) ([place])    x 3 years   . Depression   . Diabetes mellitus without complication ([place])    diet controlled  not on meds   . Edema    left leg   . Esophageal varices ([place])   . Fibromyalgia   . Gastritis   . GERD (gastroesophageal reflux disease)   .  Hepatic cirrhosis ([place])   . Hepatic encephalopathy ([place])   . History of blood transfusion   . Hyperlipidemia   . Hypertension   . IBS (irritable bowel syndrome)   . Phlebitis    left leg x 2   . Pneumonia    hx of   . Splenomegaly   . Vitamin D deficiency       10 point systems review negative except as above.    Objective:   Physical Exam  BP (!) 140/52   Pulse 80   Temp (!) 97 F (36.1 C)   Resp 18   Ht 5' 2"  (1.575 m)   Wt 185 lb (83.9 kg)   BMI 33.84 kg/m    O2 Sat is 98%   HEENT - WNL. Neck - supple.  Chest - BS decreased & clear. Cor - Nl HS. RRR w/o sig MGR. PP 1(+). No edema. Abd - Tense Ascites.  MS- FROM w/o deformities.  In Wheel chair. Neuro -  Nl w/o focal abnormalities.    Assessment & Plan:   1. Essential hypertension  - CBC with Differential/Platelet  2. Liver cirrhosis secondary to NASH (HCC)   3. Other ascites   4. Anasarca   5. Stage 4 very severe COPD by GOLD classification ([place])   6. Muscle cramps  - Magnesium  7. Anemia  - CBC with Differential/Platelet  - Return monthly for patient & family assurance who feared desertion if went on Hospice.  8. Medication management  - CBC with Differential/Platelet

## 2018-04-09 ENCOUNTER — Telehealth: Payer: Self-pay | Admitting: *Deleted

## 2018-04-09 ENCOUNTER — Encounter: Payer: Self-pay | Admitting: *Deleted

## 2018-04-09 LAB — CBC WITH DIFFERENTIAL/PLATELET
BASOS ABS: 20 {cells}/uL (ref 0–200)
Basophils Relative: 0.4 %
EOS ABS: 461 {cells}/uL (ref 15–500)
EOS PCT: 9.4 %
HEMATOCRIT: 26.1 % — AB (ref 35.0–45.0)
HEMOGLOBIN: 8.7 g/dL — AB (ref 11.7–15.5)
LYMPHS ABS: 828 {cells}/uL — AB (ref 850–3900)
MCH: 32.7 pg (ref 27.0–33.0)
MCHC: 33.3 g/dL (ref 32.0–36.0)
MCV: 98.1 fL (ref 80.0–100.0)
MPV: 11.1 fL (ref 7.5–12.5)
Monocytes Relative: 8.4 %
NEUTROS ABS: 3180 {cells}/uL (ref 1500–7800)
Neutrophils Relative %: 64.9 %
Platelets: 128 10*3/uL — ABNORMAL LOW (ref 140–400)
RBC: 2.66 10*6/uL — ABNORMAL LOW (ref 3.80–5.10)
RDW: 14.2 % (ref 11.0–15.0)
Total Lymphocyte: 16.9 %
WBC: 4.9 10*3/uL (ref 3.8–10.8)
WBCMIX: 412 {cells}/uL (ref 200–950)

## 2018-04-09 LAB — MAGNESIUM: MAGNESIUM: 2.7 mg/dL — AB (ref 1.5–2.5)

## 2018-04-09 NOTE — Telephone Encounter (Signed)
Per Dr Melford Aase, the patient can try her Flexeril 10 mg 1/2 tablet three times a day if needed for muscle cramps.  The spouse is aware.

## 2018-04-16 ENCOUNTER — Other Ambulatory Visit: Payer: Self-pay | Admitting: Internal Medicine

## 2018-04-16 ENCOUNTER — Telehealth: Payer: Self-pay | Admitting: Gastroenterology

## 2018-04-16 ENCOUNTER — Telehealth: Payer: Self-pay | Admitting: Internal Medicine

## 2018-04-16 MED ORDER — CEPHALEXIN 500 MG PO CAPS: 500.0000 mg | ORAL_CAPSULE | Freq: Four times a day (QID) | ORAL | 0 refills | Status: AC

## 2018-04-16 NOTE — Telephone Encounter (Signed)
Spoke to hospice nurse, states that for last two days they have not been able to remove as much fluid from patient's pleurx catheter. Site of insertion is red, sutures appear to be pulling loose, also leaking from around tubing. Patient is afebrile. Patient last night had severe pain at site of tube insertion.

## 2018-04-16 NOTE — Telephone Encounter (Signed)
Spoke to [place], hospice nurse, let her know that either she or the family will need to contact IR to have tubing replaced. I have given her the number to [facility] IR. She will also contact the hospice MD to ask about antibiotic coverage.

## 2018-04-16 NOTE — Telephone Encounter (Signed)
Almyra Free, Sorry to hear this. Sounds like the tube is not working. They need to call IR as they are managing it and can replace or trouble shoot it otherwise, we unfortunately don't replace those. She should be taking Cipro for SBP prophylaxis, but sounds like she may have a cellulitis at the tube site. She should be given antibiotics for this, but not sure if she should be covered for MRSA or not, probably should. I would ask that her hospice nurse evaluate the site and contact the hospice physician for recommendations regarding antibiotics. Thanks

## 2018-04-16 NOTE — Telephone Encounter (Signed)
Call [facility], advised Dr Melford Aase [facility] 595[facility]

## 2018-04-17 ENCOUNTER — Other Ambulatory Visit (HOSPITAL_COMMUNITY): Payer: Self-pay | Admitting: Interventional Radiology

## 2018-04-17 DIAGNOSIS — R188 Other ascites: Secondary | ICD-10-CM

## 2018-04-21 ENCOUNTER — Encounter (HOSPITAL_COMMUNITY): Payer: Self-pay | Admitting: Physician Assistant

## 2018-04-21 ENCOUNTER — Ambulatory Visit (HOSPITAL_COMMUNITY)
Admission: RE | Admit: 2018-04-21 | Discharge: 2018-04-21 | Disposition: A | Discharge status: Home / Self Care | Source: Ambulatory Visit | Attending: Interventional Radiology | Admitting: Interventional Radiology

## 2018-04-21 ENCOUNTER — Other Ambulatory Visit: Payer: Self-pay | Admitting: Adult Health

## 2018-04-21 ENCOUNTER — Other Ambulatory Visit (HOSPITAL_COMMUNITY): Payer: Self-pay | Admitting: Interventional Radiology

## 2018-04-21 DIAGNOSIS — R188 Other ascites: Secondary | ICD-10-CM

## 2018-04-21 DIAGNOSIS — K7460 Unspecified cirrhosis of liver: Secondary | ICD-10-CM | POA: Diagnosis not present

## 2018-04-21 DIAGNOSIS — G8929 Other chronic pain: Secondary | ICD-10-CM

## 2018-04-21 HISTORY — PX: IR RADIOLOGIST EVAL & MGMT: IMG5224

## 2018-04-21 MED ORDER — LIDOCAINE HCL 1 % IJ SOLN
INTRAMUSCULAR | Status: DC | PRN
  Administered 2018-04-21: 5 mL

## 2018-04-21 MED ORDER — LIDOCAINE HCL 1 % IJ SOLN
INTRAMUSCULAR | Status: AC
  Filled 2018-04-21: qty 20

## 2018-04-21 MED ORDER — CHLORHEXIDINE GLUCONATE 4 % EX LIQD
CUTANEOUS | Status: DC | PRN
  Administered 2018-04-21: 1 via TOPICAL

## 2018-04-21 MED ORDER — CHLORHEXIDINE GLUCONATE 4 % EX LIQD
CUTANEOUS | Status: AC
  Filled 2018-04-21: qty 15

## 2018-04-21 NOTE — Procedures (Signed)
PROCEDURE SUMMARY:  Successful removal of existing Prolene retention suture and replacement with one new Prolene suture at insertion site of tunneled peritoneal cathter. EBL: zero No immediate complications. Patient tolerated well.  Please see imaging section in Epic for full dictation.  Joaquim Nam PA-C 04/21/2018 12:43 PM

## 2018-04-22 ENCOUNTER — Other Ambulatory Visit (HOSPITAL_COMMUNITY): Payer: Self-pay | Admitting: Interventional Radiology

## 2018-04-22 ENCOUNTER — Encounter: Payer: Self-pay | Admitting: Radiology

## 2018-04-22 ENCOUNTER — Other Ambulatory Visit: Payer: Self-pay | Admitting: Radiology

## 2018-04-22 DIAGNOSIS — C220 Liver cell carcinoma: Secondary | ICD-10-CM

## 2018-04-23 ENCOUNTER — Telehealth: Payer: Self-pay | Admitting: *Deleted

## 2018-04-23 NOTE — Telephone Encounter (Signed)
A message was left for Hospice nurse that the patient should take Magnesium 500 mg three times daily.

## 2018-04-27 DIAGNOSIS — J449 Chronic obstructive pulmonary disease, unspecified: Secondary | ICD-10-CM | POA: Diagnosis not present

## 2018-04-27 DIAGNOSIS — J9610 Chronic respiratory failure, unspecified whether with hypoxia or hypercapnia: Secondary | ICD-10-CM | POA: Diagnosis not present

## 2018-04-27 DIAGNOSIS — J441 Chronic obstructive pulmonary disease with (acute) exacerbation: Secondary | ICD-10-CM | POA: Diagnosis not present

## 2018-05-02 ENCOUNTER — Ambulatory Visit: Payer: PPO | Admitting: Podiatry

## 2018-05-02 DIAGNOSIS — E1151 Type 2 diabetes mellitus with diabetic peripheral angiopathy without gangrene: Secondary | ICD-10-CM | POA: Diagnosis not present

## 2018-05-02 DIAGNOSIS — B351 Tinea unguium: Secondary | ICD-10-CM | POA: Diagnosis not present

## 2018-05-02 DIAGNOSIS — L600 Ingrowing nail: Secondary | ICD-10-CM | POA: Diagnosis not present

## 2018-05-02 NOTE — Progress Notes (Signed)
Subjective:  Patient ID: [name], female    DOB: [birth year],  MRN: [id number]  Chief Complaint  Patient presents with  . Nail Problem    3 month nail trim   72 y.o. female presents  for diabetic foot care. Last AMBS was unknown.. Reports numbness and tingling in their feet. Denies cramping in legs and thighs.  Complains of ingrown toenails to both feet.  States that they grow into the corners regularly.  Review of Systems: Negative except as noted in the HPI. Denies N/V/F/Ch.  Past Medical History:  Diagnosis Date  . Asthma   . Cancer [facility])    liver cancer   . COPD (chronic obstructive pulmonary disease) ([place])    x 3 years   . Depression   . Diabetes mellitus without complication ([place])    diet controlled  not on meds   . Edema    left leg   . Esophageal varices ([place])   . Fibromyalgia   . Gastritis   . GERD (gastroesophageal reflux disease)   . Hepatic cirrhosis ([place])   . Hepatic encephalopathy ([place])   . History of blood transfusion   . Hyperlipidemia   . Hypertension   . IBS (irritable bowel syndrome)   . Phlebitis    left leg x 2   . Pneumonia    hx of   . Splenomegaly   . Vitamin D deficiency     Current Outpatient Medications:  .  albuterol (PROVENTIL HFA;VENTOLIN HFA) 108 (90 Base) MCG/ACT inhaler, Inhale 2 puffs into the lungs every 6 (six) hours as needed for wheezing or shortness of breath., Disp: 1 Inhaler, Rfl: 2 .  allopurinol (ZYLOPRIM) 300 MG tablet, take 1 tablet (300 mg) by mouth daily with supper - for gout, Disp: 90 tablet, Rfl: 0 .  ALPRAZolam (XANAX) 0.25 MG tablet, Take 1 tab as needed for anxiety, cramping, air hunger every 8 hours as needed., Disp: 90 tablet, Rfl: 0 .  Cholecalciferol (VITAMIN D3) 5000 units CAPS, Take 5,000 Units by mouth daily with breakfast., Disp: , Rfl:  .  ciprofloxacin (CIPRO) 500 MG tablet, Take 500 mg by mouth. Take one tablet three times a week, Disp: , Rfl:  .  citalopram (CELEXA) 20 MG tablet, Take 1 tablet  (20 mg total) by mouth daily., Disp: 30 tablet, Rfl: 2 .  cyclobenzaprine (FLEXERIL) 10 MG tablet, Take 1 tablet (10 mg total) by mouth 3 (three) times daily as needed for muscle spasms., Disp: 90 tablet, Rfl: 2 .  ferrous sulfate 325 (65 FE) MG tablet, Take 1 tablet (325 mg total) by mouth daily with breakfast., Disp: 30 tablet, Rfl: 0 .  gabapentin (NEURONTIN) 600 MG tablet, Take 1 tablet (600 mg total) by mouth at bedtime., Disp: 30 tablet, Rfl: 0 .  Glycopyrrolate-Formoterol (BEVESPI AEROSPHERE) 9-4.8 MCG/ACT AERO, Inhale 2 puffs into the lungs 2 (two) times daily., Disp: 1 Inhaler, Rfl: 11 .  ipratropium (ATROVENT) 0.02 % nebulizer solution, Take 3 mLs by nebulization every 4 (four) hours as needed for wheezing or shortness of breath., Disp: , Rfl: 1 .  lactulose (CHRONULAC) 10 GM/15ML solution, take 59ms TWICE DAILY (Patient taking differently: Take 20 g by mouth daily. ), Disp: 2700 mL, Rfl: 2 .  Magnesium 250 MG TABS, Take 250 mg by mouth at bedtime. , Disp: , Rfl:  .  metolazone (ZAROXOLYN) 5 MG tablet, TAKE 1 TABLET DAILY OR AS DIRECTED FOR FLUID OVERLOAD, Disp: 30 tablet, Rfl: 0 .  midodrine (PROAMATINE) 5  MG tablet, Take 1 tablet 3 x /day with meals, Disp: 270 tablet, Rfl: 3 .  ondansetron (ZOFRAN) 4 MG tablet, Take 1 tablet (4 mg total) by mouth every 6 (six) hours as needed for nausea or vomiting., Disp: 30 tablet, Rfl: 1 .  OXYGEN, Inhale 2-3 L into the lungs continuous. 3 L with exertion, Disp: , Rfl:  .  pantoprazole (PROTONIX) 40 MG tablet, Take 1 tablet (40 mg total) by mouth daily., Disp: 90 tablet, Rfl: 0 .  potassium chloride (K-DUR) 10 MEQ tablet, Take 1 tablet (10 mEq total) by mouth every Monday, Wednesday, and Friday., Disp: 30 tablet, Rfl: 0 .  rifampin (RIFADIN) 300 MG capsule, Take 300 mg by mouth 2 (two) times daily., Disp: , Rfl:  .  spironolactone (ALDACTONE) 25 MG tablet, Take 1 tablet (25 mg total) by mouth 2 (two) times daily., Disp: 60 tablet, Rfl: 0 .  traMADol  (ULTRAM) 50 MG tablet, TAKE 1 TABLET BY MOUTH EVERY 12 HOURS AS NEEDED FOR PAIN FOR CHRONIC PAIN, Disp: 60 tablet, Rfl: 0 .  vitamin C (ASCORBIC ACID) 500 MG tablet, Take 500 mg by mouth 3 (three) times daily., Disp: , Rfl:   Social History   Tobacco Use  Smoking Status Former Smoker  . Packs/day: 1.00  . Years: 50.00  . Pack years: 50.00  . Types: Cigarettes  . Last attempt to quit: 11/05/2014  . Years since quitting: 3.4  Smokeless Tobacco Never Used  Tobacco Comment   Quit April 2016    Allergies  Allergen Reactions  . Atorvastatin Other (See Comments)    Unknown reaction  . Diphenhydramine Hcl (Sleep) Hives  . Hydrocodone-Acetaminophen Other (See Comments)    Unknown reaction  . Lopid [Gemfibrozil] Other (See Comments)    Unknown reaction  . Loratadine Hives  . Lorazepam Hives  . Simvastatin Other (See Comments)    Unknown reaction  . Sulfamethoxazole Hives  . Sulfonamide Derivatives Hives   Objective:  There were no vitals filed for this visit. There is no height or weight on file to calculate BMI. Constitutional Well developed. Well nourished.  Vascular Dorsalis pedis pulses present 1+ bilaterally  Posterior tibial pulses absent bilaterally  Pedal hair growth absent. Capillary refill normal to all digits.  No cyanosis or clubbing noted.  Neurologic Normal speech. Oriented to person, place, and time. Epicritic sensation to light touch grossly present bilaterally. Protective sensation with 5.07 monofilament  present bilaterally. Vibratory sensation present bilaterally.  Dermatologic Nails elongated, thickened, dystrophic. No open wounds. No skin lesions.  Orthopedic: Normal joint ROM without pain or crepitus bilaterally. No visible deformities. No bony tenderness.   Assessment:   1. Ingrown nail   2. Diabetes mellitus type 2 with peripheral artery disease ([place])   3. Onychomycosis    Plan:  Patient was evaluated and treated and all questions  answered.  Diabetes with PAD, Onychomycosis -Educated on diabetic footcare. Diabetic risk level 1 -Nails x10 debrided sharply and manually with large nail nipper and rotary burr.   Procedure: Nail Debridement Rationale: Patient meets criteria for routine foot care due to PAD Type of Debridement: manual, sharp debridement. Instrumentation: Nail nipper, rotary burr. Number of Nails: 8   Ingrown nails to the right hallux and right third toe. -Patient is high risk due to diabetes however letting the nails continue to grow without resolution would likely cause infection.  Unable to gently debride the nails and slant back fashion and thus partial nail avulsion of the nails back at  the base following anesthetization was indicated. --Partial nail avulsion of both borders of both toenails performed as below  Procedure: Avulsion of toenail Location: Right 3rd toe and hallux bilat borders.  Anesthesia: Lidocaine 1% plain; 1.5 mL and Marcaine 0.5% plain; 1.5 mL, digital block. Skin Prep: Betadine. Dressing: Silvadene; telfa; dry, sterile, compression dressing. Technique: Following skin prep,the affected nail borders were freed and avulsed with a hemostat. The area was cleansed.  Sterile dressing was applied.  No tourniquet was used. Disposition: Patient tolerated procedure well.   Return in about 2 weeks (around 05/16/2018) for Ingrown nails bilat.

## 2018-05-02 NOTE — Patient Instructions (Signed)

## 2018-05-05 ENCOUNTER — Other Ambulatory Visit: Payer: Self-pay | Admitting: Internal Medicine

## 2018-05-05 ENCOUNTER — Other Ambulatory Visit: Payer: Self-pay | Admitting: Physician Assistant

## 2018-05-06 ENCOUNTER — Other Ambulatory Visit: Payer: PPO

## 2018-05-06 ENCOUNTER — Ambulatory Visit (HOSPITAL_COMMUNITY): Payer: PPO

## 2018-05-07 NOTE — Progress Notes (Signed)
FOLLOW UP  Assessment and Plan:   Diagnoses and all orders for this visit:  Essential hypertension Continue medication Monitor blood pressure at home Continue DASH diet.   Reminder to go to the ER if any CP, SOB, nausea, dizziness, severe HA, changes vision/speech, left arm numbness and tingling and jaw pain.  Chronic diastolic CHF (congestive heart failure) (HCC) Weights stable/down; continue diuretics  COPD GOLD IV/ 02 dep /Chronic respiratory failure with hypoxia (HCC) Continue inhalers/home O2 Symptoms stable Follow up Dr. Melvyn Novas as recommended  Liver cirrhosis secondary to nonalcoholic steatohepatitis (NASH) ([place]) Followed by Dr. Havery Moros; deferring on liver MRI as now on hospice Having pruritis poorly managed by current vistaril, unknown topical cream by hospice - discussed with Dr. Jerilynn Mages and will try bile acid sequestrant- cholestyramine lite 4g BID initiated - CMP/GFR  CKD (chronic kidney disease), stage III ([place]) CMP/GFR  Hyperammonemia ([place]) Potassium was recently reduced to three times weekly, taking rifampin 300 mg BID, lactulose 20 mg daily ? + asterixis today - check ammonia   Continue diet and meds as discussed. Further disposition pending results of labs. Discussed med's effects and SE's.   Over 30 minutes of exam, counseling, chart review, and critical decision making was performed.   Future Appointments  Date Time Provider [place]  05/16/2018 11:00 AM Evelina Bucy, DPM [facility]  06/09/2018 10:00 AM Vicie Mutters, PA-C [facility] None  11/14/2018  3:00 PM [facility] None  11/14/2018  3:30 PM Truitt Merle, MD [facility] None    ----------------------------------------------------------------------------------------------------------------------  HPI BP (!) 150/72   Pulse 67   Temp (!) 97.3 F (36.3 C)   Ht 5' 2"  (1.575 m)   SpO2 99%   BMI 33.84 kg/m   72 y.o. female presents accompanied by her husband for  3 month follow up; on COPD/chronic respiratory failure, End Stage Liver Disease/NASH/Cirrhosis s/p recent RF Ablation x 2 of  Hepatomas and recent Abdominal paracentesis on 03/07/2018 of 2,500 cc for worsening scites, Peritoneal PleurX catheter was placed by Interventional Radiology. She also has esophageal Varices and portal hypertensive gastropathy. She has been seen by Palliative Care and agreed to DNR status, and further since has transitioned to in-home hospice care. She continues to follow up here for strong patient family preference. She is followed closely by Dr. Havery Moros.      She also has COPD/chronic respiratory failure on O2 and doing well, satting 99% on 2L today; She is on bevespi and followed by Dr. Melvyn Novas.   She has a history of Diastolic CHF, weights are down. They continue to drain 1L from PleurX daily. She is c/o new widespread pruritis; she is taking vistaril for this, and reports hospice nurse is applying a cream three times a week without much benefit.  Wt Readings from Last 3 Encounters:  04/08/18 185 lb (83.9 kg)  03/28/18 192 lb (87.1 kg)  03/18/18 196 lb (88.9 kg)   Her blood pressure has been controlled at home, today their BP is BP: (!) 150/72 She denies chest pain, unusual shortness of breath, dizziness.  Lab Results  Component Value Date   GFRNONAA 36 (L) 03/22/2018   Lab Results  Component Value Date   ALT 15 03/18/2018   AST 25 03/18/2018   ALKPHOS 85 03/14/2018   BILITOT 1.5 (H) 03/18/2018     Current Medications:  Current Outpatient Medications on File Prior to Visit  Medication Sig  . albuterol (PROVENTIL HFA;VENTOLIN HFA) 108 (90 Base) MCG/ACT inhaler Inhale 2 puffs  into the lungs every 6 (six) hours as needed for wheezing or shortness of breath.  . allopurinol (ZYLOPRIM) 300 MG tablet take 1 tablet (300 mg) by mouth daily with supper - for gout  . ALPRAZolam (XANAX) 0.25 MG tablet Take 1 tab as needed for anxiety, cramping, air hunger every 8 hours as  needed.  . Cholecalciferol (VITAMIN D3) 5000 units CAPS Take 5,000 Units by mouth daily with breakfast.  . ciprofloxacin (CIPRO) 500 MG tablet Take 500 mg by mouth. Take one tablet three times a week  . citalopram (CELEXA) 20 MG tablet Take 1 tablet (20 mg total) by mouth daily.  . cyclobenzaprine (FLEXERIL) 10 MG tablet Take 1 tablet (10 mg total) by mouth 3 (three) times daily as needed for muscle spasms.  . ferrous sulfate 325 (65 FE) MG tablet Take 1 tablet (325 mg total) by mouth daily with breakfast.  . gabapentin (NEURONTIN) 600 MG tablet Take 1 tablet (600 mg total) by mouth at bedtime.  . Glycopyrrolate-Formoterol (BEVESPI AEROSPHERE) 9-4.8 MCG/ACT AERO Inhale 2 puffs into the lungs 2 (two) times daily.  . hydrOXYzine (ATARAX/VISTARIL) 10 MG tablet TAKE 1 TABLET BY MOUTH EVERY 4 HOURS AS NEEDED FOR ITCHING  . ipratropium (ATROVENT) 0.02 % nebulizer solution Take 3 mLs by nebulization every 4 (four) hours as needed for wheezing or shortness of breath.  . lactulose (CHRONULAC) 10 GM/15ML solution take 47ms TWICE DAILY (Patient taking differently: Take 20 g by mouth daily. )  . Magnesium 250 MG TABS Take 250 mg by mouth at bedtime.   . metolazone (ZAROXOLYN) 5 MG tablet TAKE 1 TABLET DAILY OR AS DIRECTED FOR FLUID OVERLOAD  . midodrine (PROAMATINE) 5 MG tablet Take 1 tablet 3 x /day with meals  . ondansetron (ZOFRAN) 4 MG tablet Take 1 tablet (4 mg total) by mouth every 6 (six) hours as needed for nausea or vomiting.  . OXYGEN Inhale 2-3 L into the lungs continuous. 3 L with exertion  . pantoprazole (PROTONIX) 40 MG tablet Take 1 tablet (40 mg total) by mouth daily.  . potassium chloride (K-DUR) 10 MEQ tablet Take 1 tablet (10 mEq total) by mouth every Monday, Wednesday, and Friday.  . rifampin (RIFADIN) 300 MG capsule TAKE ONE CAPSULE BY MOUTH TWICE DAILY FOR LIVER  . spironolactone (ALDACTONE) 25 MG tablet TAKE ONE TABLET BY MOUTH TWICE DAILY  . traMADol (ULTRAM) 50 MG tablet TAKE 1  TABLET BY MOUTH EVERY 12 HOURS AS NEEDED FOR PAIN FOR CHRONIC PAIN  . vitamin C (ASCORBIC ACID) 500 MG tablet Take 500 mg by mouth 3 (three) times daily.   No current facility-administered medications on file prior to visit.      Allergies:  Allergies  Allergen Reactions  . Atorvastatin Other (See Comments)    Unknown reaction  . Diphenhydramine Hcl (Sleep) Hives  . Hydrocodone-Acetaminophen Other (See Comments)    Unknown reaction  . Lopid [Gemfibrozil] Other (See Comments)    Unknown reaction  . Loratadine Hives  . Lorazepam Hives  . Simvastatin Other (See Comments)    Unknown reaction  . Sulfamethoxazole Hives  . Sulfonamide Derivatives Hives     Medical History:  Past Medical History:  Diagnosis Date  . Asthma   . Cancer ([facility]    liver cancer   . [place]    x 3 years   . Depression   . Diabetes mellitus without complication ([place]    diet controlled  not on meds   .  Edema    left leg   . Esophageal varices ([place])   . Fibromyalgia   . Gastritis   . GERD (gastroesophageal reflux disease)   . Hepatic cirrhosis ([place])   . Hepatic encephalopathy ([place])   . History of blood transfusion   . Hyperlipidemia   . Hypertension   . IBS (irritable bowel syndrome)   . Phlebitis    left leg x 2   . Pneumonia    hx of   . Splenomegaly   . Vitamin D deficiency    Family history- Reviewed and unchanged Social history- Reviewed and unchanged   Review of Systems:  Review of Systems  Constitutional: Positive for malaise/fatigue. Negative for chills, fever and weight loss.  HENT: Negative for hearing loss and tinnitus.   Eyes: Negative for blurred vision and double vision.  Respiratory: Positive for shortness of breath (chronic, at baseline). Negative for cough and wheezing.   Cardiovascular: Negative for chest pain, palpitations, orthopnea, claudication and leg swelling.  Gastrointestinal: Positive for abdominal pain. Negative for  blood in stool, constipation, diarrhea, heartburn, melena, nausea and vomiting.  Genitourinary: Negative.   Musculoskeletal: Positive for joint pain (lower back/hip). Negative for myalgias.  Skin: Negative for rash.  Neurological: Positive for sensory change (widespread pruritis). Negative for dizziness, tingling and headaches.  Endo/Heme/Allergies: Negative for polydipsia.  Psychiatric/Behavioral: Negative.   All other systems reviewed and are negative.     Physical Exam: BP (!) 150/72   Pulse 67   Temp (!) 97.3 F (36.3 C)   Ht 5' 2"  (1.575 m)   SpO2 99%   BMI 33.84 kg/m  Wt Readings from Last 3 Encounters:  04/08/18 185 lb (83.9 kg)  03/28/18 192 lb (87.1 kg)  03/18/18 196 lb (88.9 kg)    General Appearance: Wheelchair bound, in no acute distress, though appears in pain Eyes: PERRLA, EOMs, conjunctiva no swelling or erythema ENT/Mouth: Ext aud canals clear, TMs without erythema, bulging. No erythema, swelling, or exudate on post pharynx.  Tonsils not swollen or erythematous. Hearing normal.  Neck: Supple, thyroid normal.  Respiratory: Respiratory effort normal, BS equal bilaterally without notable rales, rhonchi, wheezing or stridor. ? Mildly dinimished in very base of RLL Cardio: RRR with 3/6 systolic murmur. Symmetrical peripheral pulses without edema.  Abdomen: Distended, firm, + BS.  Non tender, no guarding. No anasarca, ? Mild astarixis  Lymphatics: Non tender without lymphadenopathy.  Musculoskeletal: Wheelchair bound, exam limited due to pain Skin: Warm, dry without rashes, lesions; she does have large ecchymosis to left forearm. Neuro: Cranial nerves intact.  Psych: Awake and oriented X 3, normal affect, Insight and Judgment fair.    Izora Ribas, NP 1:15 PM [facility]

## 2018-05-08 ENCOUNTER — Ambulatory Visit (INDEPENDENT_AMBULATORY_CARE_PROVIDER_SITE_OTHER): Payer: PPO | Admitting: Adult Health

## 2018-05-08 ENCOUNTER — Encounter: Payer: Self-pay | Admitting: Adult Health

## 2018-05-08 VITALS — BP 150/72 | HR 67 | Temp 97.3°F | Ht 62.0 in

## 2018-05-08 DIAGNOSIS — E7220 Disorder of urea cycle metabolism, unspecified: Secondary | ICD-10-CM

## 2018-05-08 DIAGNOSIS — N183 Chronic kidney disease, stage 3 unspecified: Secondary | ICD-10-CM

## 2018-05-08 DIAGNOSIS — E669 Obesity, unspecified: Secondary | ICD-10-CM | POA: Diagnosis not present

## 2018-05-08 DIAGNOSIS — I5032 Chronic diastolic (congestive) heart failure: Secondary | ICD-10-CM

## 2018-05-08 DIAGNOSIS — K7460 Unspecified cirrhosis of liver: Secondary | ICD-10-CM

## 2018-05-08 DIAGNOSIS — E785 Hyperlipidemia, unspecified: Secondary | ICD-10-CM

## 2018-05-08 DIAGNOSIS — K7581 Nonalcoholic steatohepatitis (NASH): Secondary | ICD-10-CM | POA: Diagnosis not present

## 2018-05-08 DIAGNOSIS — J449 Chronic obstructive pulmonary disease, unspecified: Secondary | ICD-10-CM | POA: Diagnosis not present

## 2018-05-08 DIAGNOSIS — I10 Essential (primary) hypertension: Secondary | ICD-10-CM

## 2018-05-08 DIAGNOSIS — J9611 Chronic respiratory failure with hypoxia: Secondary | ICD-10-CM | POA: Diagnosis not present

## 2018-05-08 MED ORDER — CHOLESTYRAMINE LIGHT 4 GM/DOSE PO POWD: 4.0000 g | Freq: Two times a day (BID) | ORAL | 1 refills | Status: AC

## 2018-05-09 ENCOUNTER — Inpatient Hospital Stay (HOSPITAL_COMMUNITY)
Admission: EM | Admit: 2018-05-09 | Discharge: 2018-05-11 | DRG: 683 | Disposition: A | Discharge status: Hospice | Attending: Internal Medicine | Admitting: Internal Medicine

## 2018-05-09 ENCOUNTER — Observation Stay (HOSPITAL_COMMUNITY)

## 2018-05-09 ENCOUNTER — Encounter (HOSPITAL_COMMUNITY): Payer: Self-pay

## 2018-05-09 ENCOUNTER — Telehealth: Payer: Self-pay | Admitting: *Deleted

## 2018-05-09 DIAGNOSIS — M6281 Muscle weakness (generalized): Secondary | ICD-10-CM | POA: Diagnosis present

## 2018-05-09 DIAGNOSIS — J439 Emphysema, unspecified: Secondary | ICD-10-CM | POA: Diagnosis not present

## 2018-05-09 DIAGNOSIS — K2100 Gastro-esophageal reflux disease with esophagitis, without bleeding: Secondary | ICD-10-CM | POA: Diagnosis present

## 2018-05-09 DIAGNOSIS — E559 Vitamin D deficiency, unspecified: Secondary | ICD-10-CM | POA: Diagnosis present

## 2018-05-09 DIAGNOSIS — R0600 Dyspnea, unspecified: Secondary | ICD-10-CM

## 2018-05-09 DIAGNOSIS — Z885 Allergy status to narcotic agent status: Secondary | ICD-10-CM

## 2018-05-09 DIAGNOSIS — E785 Hyperlipidemia, unspecified: Secondary | ICD-10-CM | POA: Diagnosis present

## 2018-05-09 DIAGNOSIS — D631 Anemia in chronic kidney disease: Secondary | ICD-10-CM | POA: Diagnosis present

## 2018-05-09 DIAGNOSIS — F329 Major depressive disorder, single episode, unspecified: Secondary | ICD-10-CM | POA: Diagnosis present

## 2018-05-09 DIAGNOSIS — Z833 Family history of diabetes mellitus: Secondary | ICD-10-CM

## 2018-05-09 DIAGNOSIS — Z90710 Acquired absence of both cervix and uterus: Secondary | ICD-10-CM

## 2018-05-09 DIAGNOSIS — I130 Hypertensive heart and chronic kidney disease with heart failure and stage 1 through stage 4 chronic kidney disease, or unspecified chronic kidney disease: Secondary | ICD-10-CM | POA: Diagnosis present

## 2018-05-09 DIAGNOSIS — Z7401 Bed confinement status: Secondary | ICD-10-CM | POA: Diagnosis not present

## 2018-05-09 DIAGNOSIS — K7581 Nonalcoholic steatohepatitis (NASH): Secondary | ICD-10-CM | POA: Diagnosis present

## 2018-05-09 DIAGNOSIS — E860 Dehydration: Secondary | ICD-10-CM | POA: Diagnosis present

## 2018-05-09 DIAGNOSIS — K766 Portal hypertension: Secondary | ICD-10-CM | POA: Diagnosis present

## 2018-05-09 DIAGNOSIS — E871 Hypo-osmolality and hyponatremia: Secondary | ICD-10-CM | POA: Diagnosis not present

## 2018-05-09 DIAGNOSIS — N186 End stage renal disease: Secondary | ICD-10-CM | POA: Diagnosis not present

## 2018-05-09 DIAGNOSIS — K7460 Unspecified cirrhosis of liver: Secondary | ICD-10-CM | POA: Diagnosis present

## 2018-05-09 DIAGNOSIS — N183 Chronic kidney disease, stage 3 (moderate): Secondary | ICD-10-CM | POA: Diagnosis present

## 2018-05-09 DIAGNOSIS — K219 Gastro-esophageal reflux disease without esophagitis: Secondary | ICD-10-CM | POA: Diagnosis present

## 2018-05-09 DIAGNOSIS — D6959 Other secondary thrombocytopenia: Secondary | ICD-10-CM | POA: Diagnosis present

## 2018-05-09 DIAGNOSIS — E1122 Type 2 diabetes mellitus with diabetic chronic kidney disease: Secondary | ICD-10-CM | POA: Diagnosis not present

## 2018-05-09 DIAGNOSIS — E875 Hyperkalemia: Secondary | ICD-10-CM | POA: Diagnosis not present

## 2018-05-09 DIAGNOSIS — Z66 Do not resuscitate: Secondary | ICD-10-CM | POA: Diagnosis present

## 2018-05-09 DIAGNOSIS — Z96643 Presence of artificial hip joint, bilateral: Secondary | ICD-10-CM | POA: Diagnosis present

## 2018-05-09 DIAGNOSIS — I120 Hypertensive chronic kidney disease with stage 5 chronic kidney disease or end stage renal disease: Secondary | ICD-10-CM | POA: Diagnosis not present

## 2018-05-09 DIAGNOSIS — J449 Chronic obstructive pulmonary disease, unspecified: Secondary | ICD-10-CM | POA: Diagnosis present

## 2018-05-09 DIAGNOSIS — K589 Irritable bowel syndrome without diarrhea: Secondary | ICD-10-CM | POA: Diagnosis present

## 2018-05-09 DIAGNOSIS — Z87891 Personal history of nicotine dependence: Secondary | ICD-10-CM

## 2018-05-09 DIAGNOSIS — Z8505 Personal history of malignant neoplasm of liver: Secondary | ICD-10-CM

## 2018-05-09 DIAGNOSIS — Z888 Allergy status to other drugs, medicaments and biological substances status: Secondary | ICD-10-CM

## 2018-05-09 DIAGNOSIS — I5032 Chronic diastolic (congestive) heart failure: Secondary | ICD-10-CM | POA: Diagnosis present

## 2018-05-09 DIAGNOSIS — N179 Acute kidney failure, unspecified: Secondary | ICD-10-CM | POA: Diagnosis not present

## 2018-05-09 DIAGNOSIS — Z9981 Dependence on supplemental oxygen: Secondary | ICD-10-CM

## 2018-05-09 DIAGNOSIS — I9589 Other hypotension: Secondary | ICD-10-CM | POA: Diagnosis present

## 2018-05-09 DIAGNOSIS — R54 Age-related physical debility: Secondary | ICD-10-CM | POA: Diagnosis present

## 2018-05-09 DIAGNOSIS — Z8701 Personal history of pneumonia (recurrent): Secondary | ICD-10-CM

## 2018-05-09 DIAGNOSIS — R531 Weakness: Secondary | ICD-10-CM | POA: Diagnosis not present

## 2018-05-09 DIAGNOSIS — I4510 Unspecified right bundle-branch block: Secondary | ICD-10-CM | POA: Diagnosis not present

## 2018-05-09 DIAGNOSIS — M797 Fibromyalgia: Secondary | ICD-10-CM | POA: Diagnosis present

## 2018-05-09 DIAGNOSIS — D500 Iron deficiency anemia secondary to blood loss (chronic): Secondary | ICD-10-CM | POA: Diagnosis present

## 2018-05-09 DIAGNOSIS — Z79899 Other long term (current) drug therapy: Secondary | ICD-10-CM

## 2018-05-09 DIAGNOSIS — Z882 Allergy status to sulfonamides status: Secondary | ICD-10-CM

## 2018-05-09 DIAGNOSIS — R188 Other ascites: Secondary | ICD-10-CM | POA: Diagnosis present

## 2018-05-09 DIAGNOSIS — Z8672 Personal history of thrombophlebitis: Secondary | ICD-10-CM

## 2018-05-09 DIAGNOSIS — T502X5A Adverse effect of carbonic-anhydrase inhibitors, benzothiadiazides and other diuretics, initial encounter: Secondary | ICD-10-CM | POA: Diagnosis present

## 2018-05-09 DIAGNOSIS — D696 Thrombocytopenia, unspecified: Secondary | ICD-10-CM | POA: Diagnosis present

## 2018-05-09 DIAGNOSIS — Z992 Dependence on renal dialysis: Secondary | ICD-10-CM | POA: Diagnosis not present

## 2018-05-09 DIAGNOSIS — Z8249 Family history of ischemic heart disease and other diseases of the circulatory system: Secondary | ICD-10-CM

## 2018-05-09 LAB — HEPATIC FUNCTION PANEL
ALBUMIN: 2.5 g/dL — AB (ref 3.5–5.0)
ALK PHOS: 171 U/L — AB (ref 38–126)
ALT: 32 U/L (ref 0–44)
AST: 43 U/L — AB (ref 15–41)
Bilirubin, Direct: 0.5 mg/dL — ABNORMAL HIGH (ref 0.0–0.2)
Indirect Bilirubin: 0.8 mg/dL (ref 0.3–0.9)
TOTAL PROTEIN: 4.7 g/dL — AB (ref 6.5–8.1)
Total Bilirubin: 1.3 mg/dL — ABNORMAL HIGH (ref 0.3–1.2)

## 2018-05-09 LAB — BASIC METABOLIC PANEL
ANION GAP: 8 (ref 5–15)
ANION GAP: 9 (ref 5–15)
Anion gap: 6 (ref 5–15)
BUN: 60 mg/dL — ABNORMAL HIGH (ref 8–23)
BUN: 59 mg/dL — AB (ref 8–23)
BUN: 57 mg/dL — AB (ref 8–23)
CALCIUM: 8.7 mg/dL — AB (ref 8.9–10.3)
CALCIUM: 9.4 mg/dL (ref 8.9–10.3)
CO2: 16 mmol/L — ABNORMAL LOW (ref 22–32)
CO2: 16 mmol/L — AB (ref 22–32)
CO2: 18 mmol/L — ABNORMAL LOW (ref 22–32)
CREATININE: 2.13 mg/dL — AB (ref 0.44–1.00)
Calcium: 8.9 mg/dL (ref 8.9–10.3)
Chloride: 97 mmol/L — ABNORMAL LOW (ref 98–111)
Chloride: 98 mmol/L (ref 98–111)
Chloride: 99 mmol/L (ref 98–111)
Creatinine, Ser: 2.14 mg/dL — ABNORMAL HIGH (ref 0.44–1.00)
Creatinine, Ser: 2.26 mg/dL — ABNORMAL HIGH (ref 0.44–1.00)
GFR calc Af Amer: 24 mL/min — ABNORMAL LOW (ref >60)
GFR calc Af Amer: 26 mL/min — ABNORMAL LOW (ref >60)
GFR calc non Af Amer: 21 mL/min — ABNORMAL LOW (ref >60)
GFR, EST AFRICAN AMERICAN: 26 mL/min — AB (ref >60)
GFR, EST NON AFRICAN AMERICAN: 22 mL/min — AB (ref >60)
GFR, EST NON AFRICAN AMERICAN: 22 mL/min — AB (ref >60)
GLUCOSE: 126 mg/dL — AB (ref 70–99)
GLUCOSE: 147 mg/dL — AB (ref 70–99)
Glucose, Bld: 116 mg/dL — ABNORMAL HIGH (ref 70–99)
Potassium: 7.4 mmol/L (ref 3.5–5.1)
Potassium: 7 mmol/L (ref 3.5–5.1)
Potassium: 6.6 mmol/L (ref 3.5–5.1)
SODIUM: 121 mmol/L — AB (ref 135–145)
Sodium: 123 mmol/L — ABNORMAL LOW (ref 135–145)
Sodium: 123 mmol/L — ABNORMAL LOW (ref 135–145)

## 2018-05-09 LAB — COMPLETE METABOLIC PANEL WITH GFR
AG Ratio: 1.7 (calc) (ref 1.0–2.5)
ALKALINE PHOSPHATASE (APISO): 202 U/L — AB (ref 33–130)
ALT: 26 U/L (ref 6–29)
AST: 37 U/L — AB (ref 10–35)
Albumin: 2.9 g/dL — ABNORMAL LOW (ref 3.6–5.1)
BILIRUBIN TOTAL: 0.8 mg/dL (ref 0.2–1.2)
BUN/Creatinine Ratio: 33 (calc) — ABNORMAL HIGH (ref 6–22)
BUN: 62 mg/dL — AB (ref 7–25)
CHLORIDE: 96 mmol/L — AB (ref 98–110)
CO2: 17 mmol/L — AB (ref 20–32)
Calcium: 8.8 mg/dL (ref 8.6–10.4)
Creat: 1.89 mg/dL — ABNORMAL HIGH (ref 0.60–0.93)
GFR, EST AFRICAN AMERICAN: 30 mL/min/{1.73_m2} — AB (ref >=60)
GFR, Est Non African American: 26 mL/min/{1.73_m2} — ABNORMAL LOW (ref >=60)
GLUCOSE: 96 mg/dL (ref 65–99)
Globulin: 1.7 g/dL (calc) — ABNORMAL LOW (ref 1.9–3.7)
POTASSIUM: 7.8 mmol/L — AB (ref 3.5–5.3)
Sodium: 119 mmol/L — CL (ref 135–146)
TOTAL PROTEIN: 4.6 g/dL — AB (ref 6.1–8.1)

## 2018-05-09 LAB — CBC WITH DIFFERENTIAL/PLATELET
BASOS ABS: 62 {cells}/uL (ref 0–200)
Basophils Relative: 0.8 %
EOS PCT: 4 %
Eosinophils Absolute: 308 cells/uL (ref 15–500)
HCT: 28.3 % — ABNORMAL LOW (ref 35.0–45.0)
Hemoglobin: 9.5 g/dL — ABNORMAL LOW (ref 11.7–15.5)
Lymphs Abs: 824 cells/uL — ABNORMAL LOW (ref 850–3900)
MCH: 32.9 pg (ref 27.0–33.0)
MCHC: 33.6 g/dL (ref 32.0–36.0)
MCV: 97.9 fL (ref 80.0–100.0)
MONOS PCT: 7.9 %
MPV: 12.5 fL (ref 7.5–12.5)
NEUTROS ABS: 5898 {cells}/uL (ref 1500–7800)
NEUTROS PCT: 76.6 %
PLATELETS: 147 10*3/uL (ref 140–400)
RBC: 2.89 10*6/uL — ABNORMAL LOW (ref 3.80–5.10)
RDW: 14.6 % (ref 11.0–15.0)
TOTAL LYMPHOCYTE: 10.7 %
WBC mixed population: 608 cells/uL (ref 200–950)
WBC: 7.7 10*3/uL (ref 3.8–10.8)

## 2018-05-09 LAB — CBC
HCT: 29.1 % — ABNORMAL LOW (ref 36.0–46.0)
HEMOGLOBIN: 8.9 g/dL — AB (ref 12.0–15.0)
MCH: 32.7 pg (ref 26.0–34.0)
MCHC: 30.6 g/dL (ref 30.0–36.0)
MCV: 107 fL — ABNORMAL HIGH (ref 78.0–100.0)
Platelets: 127 10*3/uL — ABNORMAL LOW (ref 150–400)
RBC: 2.72 MIL/uL — AB (ref 3.87–5.11)
RDW: 15.9 % — ABNORMAL HIGH (ref 11.5–15.5)
WBC: 7.1 10*3/uL (ref 4.0–10.5)

## 2018-05-09 LAB — AMMONIA
AMMONIA: 49 umol/L (ref <=72)
AMMONIA: 28 umol/L (ref 9–35)

## 2018-05-09 LAB — PROTIME-INR
INR: 1.14
Prothrombin Time: 14.5 seconds (ref 11.4–15.2)

## 2018-05-09 MED ORDER — TRAMADOL HCL 50 MG PO TABS
50.0000 mg | ORAL_TABLET | Freq: Two times a day (BID) | ORAL | Status: DC | PRN
  Administered 2018-05-11: 50 mg via ORAL
  Filled 2018-05-09: qty 1

## 2018-05-09 MED ORDER — LACTULOSE 10 GM/15ML PO SOLN: 20.0000 g | Freq: Every day | ORAL | Status: DC

## 2018-05-09 MED ORDER — CIPROFLOXACIN HCL 500 MG PO TABS
500.0000 mg | ORAL_TABLET | ORAL | Status: DC
  Administered 2018-05-09 (×2): 500 mg via ORAL
  Filled 2018-05-09: qty 1

## 2018-05-09 MED ORDER — ALPRAZOLAM 0.25 MG PO TABS
0.2500 mg | ORAL_TABLET | Freq: Three times a day (TID) | ORAL | Status: DC | PRN
  Filled 2018-05-09: qty 1

## 2018-05-09 MED ORDER — MIDODRINE HCL 5 MG PO TABS: 5.0000 mg | ORAL_TABLET | Freq: Three times a day (TID) | ORAL | Status: DC

## 2018-05-09 MED ORDER — SODIUM CHLORIDE 0.9 % IV BOLUS
1000.0000 mL | Freq: Once | INTRAVENOUS | Status: AC
  Administered 2018-05-09: 1000 mL via INTRAVENOUS

## 2018-05-09 MED ORDER — SODIUM CHLORIDE 0.9 % IV BOLUS: 500.0000 mL | Freq: Once | INTRAVENOUS | Status: DC

## 2018-05-09 MED ORDER — ALLOPURINOL 300 MG PO TABS
300.0000 mg | ORAL_TABLET | Freq: Every day | ORAL | Status: DC
  Administered 2018-05-10: 300 mg via ORAL
  Filled 2018-05-09 (×2): qty 1

## 2018-05-09 MED ORDER — MAGNESIUM OXIDE 400 (241.3 MG) MG PO TABS
400.0000 mg | ORAL_TABLET | Freq: Every day | ORAL | Status: DC
  Administered 2018-05-10 – 2018-05-11 (×2): 400 mg via ORAL
  Filled 2018-05-09 (×2): qty 1

## 2018-05-09 MED ORDER — MIDODRINE HCL 5 MG PO TABS
5.0000 mg | ORAL_TABLET | Freq: Three times a day (TID) | ORAL | Status: DC
  Administered 2018-05-09 – 2018-05-11 (×5): 5 mg via ORAL
  Filled 2018-05-09 (×5): qty 1

## 2018-05-09 MED ORDER — CALCIUM GLUCONATE 10 % IV SOLN: 1.0000 g | Freq: Once | INTRAVENOUS | Status: DC

## 2018-05-09 MED ORDER — VITAMIN D 1000 UNITS PO TABS
5000.0000 [IU] | ORAL_TABLET | Freq: Every day | ORAL | Status: DC
  Administered 2018-05-10 – 2018-05-11 (×2): 5000 [IU] via ORAL
  Filled 2018-05-09 (×2): qty 5

## 2018-05-09 MED ORDER — CHOLESTYRAMINE LIGHT 4 G PO PACK
4.0000 g | PACK | Freq: Two times a day (BID) | ORAL | Status: DC
  Administered 2018-05-10 – 2018-05-11 (×3): 4 g via ORAL
  Filled 2018-05-09 (×5): qty 1

## 2018-05-09 MED ORDER — CIPROFLOXACIN HCL 500 MG PO TABS: 500.0000 mg | ORAL_TABLET | ORAL | Status: DC

## 2018-05-09 MED ORDER — ALBUTEROL SULFATE (2.5 MG/3ML) 0.083% IN NEBU
10.0000 mg | INHALATION_SOLUTION | Freq: Once | RESPIRATORY_TRACT | Status: AC
  Administered 2018-05-09: 10 mg via RESPIRATORY_TRACT
  Filled 2018-05-09: qty 12

## 2018-05-09 MED ORDER — GABAPENTIN 600 MG PO TABS
600.0000 mg | ORAL_TABLET | Freq: Three times a day (TID) | ORAL | Status: DC
  Administered 2018-05-09 – 2018-05-11 (×5): 600 mg via ORAL
  Filled 2018-05-09 (×5): qty 1

## 2018-05-09 MED ORDER — PATIROMER SORBITEX CALCIUM 8.4 G PO PACK
8.4000 g | PACK | Freq: Once | ORAL | Status: AC
  Administered 2018-05-09: 8.4 g via ORAL
  Filled 2018-05-09: qty 1

## 2018-05-09 MED ORDER — INSULIN ASPART 100 UNIT/ML IV SOLN
5.0000 [IU] | Freq: Once | INTRAVENOUS | Status: AC
  Administered 2018-05-09: 5 [IU] via INTRAVENOUS
  Filled 2018-05-09: qty 0.05

## 2018-05-09 MED ORDER — IPRATROPIUM BROMIDE 0.02 % IN SOLN: 0.5000 mg | RESPIRATORY_TRACT | Status: DC | PRN

## 2018-05-09 MED ORDER — MAGNESIUM OXIDE 400 (241.3 MG) MG PO TABS: 400.0000 mg | ORAL_TABLET | Freq: Every day | ORAL | Status: DC

## 2018-05-09 MED ORDER — CYCLOBENZAPRINE HCL 10 MG PO TABS
10.0000 mg | ORAL_TABLET | Freq: Three times a day (TID) | ORAL | Status: DC | PRN
  Administered 2018-05-11: 10 mg via ORAL
  Filled 2018-05-09: qty 1

## 2018-05-09 MED ORDER — GABAPENTIN 600 MG PO TABS: 600.0000 mg | ORAL_TABLET | Freq: Three times a day (TID) | ORAL | Status: DC

## 2018-05-09 MED ORDER — SODIUM BICARBONATE 8.4 % IV SOLN
50.0000 meq | Freq: Once | INTRAVENOUS | Status: AC
  Administered 2018-05-09: 50 meq via INTRAVENOUS
  Filled 2018-05-09: qty 50

## 2018-05-09 MED ORDER — INSULIN ASPART 100 UNIT/ML ~~LOC~~ SOLN
5.0000 [IU] | Freq: Once | SUBCUTANEOUS | Status: AC
  Administered 2018-05-09: 5 [IU] via SUBCUTANEOUS

## 2018-05-09 MED ORDER — ONDANSETRON HCL 4 MG/2ML IJ SOLN
4.0000 mg | Freq: Four times a day (QID) | INTRAMUSCULAR | Status: DC | PRN
  Administered 2018-05-10: 4 mg via INTRAVENOUS
  Filled 2018-05-09: qty 2

## 2018-05-09 MED ORDER — ONDANSETRON HCL 4 MG PO TABS: 4.0000 mg | ORAL_TABLET | Freq: Four times a day (QID) | ORAL | Status: DC | PRN

## 2018-05-09 MED ORDER — RIFAMPIN 300 MG PO CAPS
300.0000 mg | ORAL_CAPSULE | Freq: Two times a day (BID) | ORAL | Status: DC
  Administered 2018-05-09 – 2018-05-11 (×4): 300 mg via ORAL
  Filled 2018-05-09 (×4): qty 1

## 2018-05-09 MED ORDER — METOLAZONE 5 MG PO TABS
5.0000 mg | ORAL_TABLET | Freq: Every day | ORAL | Status: DC
  Filled 2018-05-09: qty 1

## 2018-05-09 MED ORDER — METOLAZONE 5 MG PO TABS
5.0000 mg | ORAL_TABLET | Freq: Every day | ORAL | Status: DC
  Administered 2018-05-09 – 2018-05-10 (×2): 5 mg via ORAL
  Filled 2018-05-09 (×3): qty 1

## 2018-05-09 MED ORDER — RIFAMPIN 300 MG PO CAPS
300.0000 mg | ORAL_CAPSULE | Freq: Two times a day (BID) | ORAL | Status: DC
  Filled 2018-05-09: qty 1

## 2018-05-09 MED ORDER — SODIUM CHLORIDE 0.9 % IV SOLN: INTRAVENOUS | Status: DC

## 2018-05-09 MED ORDER — LACTULOSE 10 GM/15ML PO SOLN
20.0000 g | Freq: Every day | ORAL | Status: DC
  Administered 2018-05-10 – 2018-05-11 (×2): 20 g via ORAL
  Filled 2018-05-09 (×2): qty 30

## 2018-05-09 MED ORDER — PANTOPRAZOLE SODIUM 40 MG PO TBEC: 40.0000 mg | DELAYED_RELEASE_TABLET | Freq: Every day | ORAL | Status: DC

## 2018-05-09 MED ORDER — FERROUS SULFATE 325 (65 FE) MG PO TABS
325.0000 mg | ORAL_TABLET | Freq: Every day | ORAL | Status: DC
  Administered 2018-05-10 – 2018-05-11 (×2): 325 mg via ORAL
  Filled 2018-05-09 (×2): qty 1

## 2018-05-09 MED ORDER — PANTOPRAZOLE SODIUM 40 MG PO TBEC
40.0000 mg | DELAYED_RELEASE_TABLET | Freq: Every day | ORAL | Status: DC
  Administered 2018-05-10 – 2018-05-11 (×2): 40 mg via ORAL
  Filled 2018-05-09 (×2): qty 1

## 2018-05-09 MED ORDER — DEXTROSE 50 % IV SOLN
25.0000 g | Freq: Once | INTRAVENOUS | Status: AC
  Administered 2018-05-09: 25 g via INTRAVENOUS
  Filled 2018-05-09: qty 50

## 2018-05-09 MED ORDER — SODIUM CHLORIDE 0.9 % IV SOLN
1.0000 g | Freq: Once | INTRAVENOUS | Status: AC
  Administered 2018-05-09: 1 g via INTRAVENOUS
  Filled 2018-05-09: qty 10

## 2018-05-09 MED ORDER — DEXTROSE 50 % IV SOLN
1.0000 | Freq: Once | INTRAVENOUS | Status: AC
  Administered 2018-05-09: 50 mL via INTRAVENOUS
  Filled 2018-05-09: qty 50

## 2018-05-09 MED ORDER — CITALOPRAM HYDROBROMIDE 20 MG PO TABS
20.0000 mg | ORAL_TABLET | Freq: Every day | ORAL | Status: DC
  Administered 2018-05-10 – 2018-05-11 (×2): 20 mg via ORAL
  Filled 2018-05-09 (×2): qty 1

## 2018-05-09 MED ORDER — SODIUM POLYSTYRENE SULFONATE 15 GM/60ML PO SUSP
30.0000 g | Freq: Once | ORAL | Status: DC
  Filled 2018-05-09: qty 120

## 2018-05-09 MED ORDER — SODIUM POLYSTYRENE SULFONATE 15 GM/60ML PO SUSP
30.0000 g | Freq: Once | ORAL | Status: AC
  Administered 2018-05-09: 30 g via ORAL
  Filled 2018-05-09: qty 120

## 2018-05-09 MED ORDER — HYDROXYZINE HCL 10 MG PO TABS
10.0000 mg | ORAL_TABLET | ORAL | Status: DC | PRN
  Filled 2018-05-09: qty 1

## 2018-05-09 MED ORDER — LACTATED RINGERS IV SOLN: INTRAVENOUS | Status: DC

## 2018-05-09 MED ORDER — PATIROMER SORBITEX CALCIUM 8.4 G PO PACK
8.4000 g | PACK | Freq: Three times a day (TID) | ORAL | Status: AC
  Administered 2018-05-09 – 2018-05-10 (×2): 8.4 g via ORAL
  Filled 2018-05-09 (×3): qty 1

## 2018-05-09 NOTE — ED Provider Notes (Addendum)
[facility] EMERGENCY DEPARTMENT Provider Note   CSN: [id number] Arrival date & time: 05/09/18  0946     History   Chief Complaint Chief Complaint  Patient presents with  . Abnormal Lab    HPI [name] is a 72 y.o. female.  HPI  72 year old female with a history of liver cancer, cirrhosis and ascites, diabetes, COPD, who is currently in hospice presents with hyperkalemia.  Sent by her PCP due to elevated potassium seen on labs yesterday.  The patient has felt generally weak for about 3 days including difficulty walking due to her legs feeling weak.  She has some chronic loose stools but no new diarrhea.  Has been having on and off nausea for months but no vomiting.  No fevers.  No cough or trouble breathing.  She frequently has fluid drawn off of a catheter from her abdomen for the ascites.  This is nearly daily.  The patient is currently in hospice, but when talking to me desires to live almost no matter what.  However she does state that she would not want CPR performed or to be intubated.  She would consider dialysis if needed.  Past Medical History:  Diagnosis Date  . Asthma   . Cancer [facility])    liver cancer   . COPD (chronic obstructive pulmonary disease) ([place])    x 3 years   . Depression   . Diabetes mellitus without complication ([place])    diet controlled  not on meds   . Edema    left leg   . Esophageal varices ([place])   . Fibromyalgia   . Gastritis   . GERD (gastroesophageal reflux disease)   . Hepatic cirrhosis ([place])   . Hepatic encephalopathy ([place])   . History of blood transfusion   . Hyperlipidemia   . Hypertension   . IBS (irritable bowel syndrome)   . Phlebitis    left leg x 2   . Pneumonia    hx of   . Splenomegaly   . Vitamin D deficiency     Patient Active Problem List   Diagnosis Date Noted  . Hyperkalemia 05/09/2018  . Advanced care planning/counseling discussion   . Palliative care by specialist   . Shortness of breath  03/11/2018  . Abdominal pain 03/11/2018  . Hypervolemia   . Pressure injury of skin 02/27/2018  . Iron deficiency anemia due to chronic blood loss   . Occult blood in stools   . Acute blood loss anemia 01/19/2018  . Chronic respiratory failure with hypoxia and hypercapnia ([place]) 12/26/2017  . Obesity hypoventilation syndrome ([place])   . Recurrent right pleural effusion   . NASH (nonalcoholic steatohepatitis)   . Liver cirrhosis secondary to nonalcoholic steatohepatitis (NASH) ([place])   . Chronic diastolic CHF (congestive heart failure) ([place])   . Macrocytic anemia   . CKD (chronic kidney disease), stage III ([place])   . Ascites 09/07/2017  . Hepatocellular carcinoma ([place]) 08/30/2017  . Anasarca 12/14/2016  . Obesity (BMI 30.0-34.9) 10/12/2016  . Hyperammonemia ([place]) 10/12/2016  . Atrial tachycardia, multifocal ([place])   . Chronic respiratory failure ([place]) 01/19/2016  . Hepatic encephalopathy ([place]) 09/21/2015  . Hypokalemia 09/21/2015  . COPD GOLD IV/ 02 dep  08/30/2015  . Thrombocytopenia ([place]) 08/07/2015  . Venous insufficiency 07/26/2015  . Goals of care, counseling/discussion 12/14/2013  . Essential hypertension   . Hyperlipidemia   . GERD   . Vitamin D deficiency   . IBS   . Fibromyalgia   .  Esophageal varices ([place]) 07/08/2012  . COLONIC POLYPS 11/11/2008  . Symptomatic anemia 05/28/2008  . Esophagitis 05/27/2008  . Diverticulosis of large intestine 05/27/2008    Past Surgical History:  Procedure Laterality Date  . ABDOMINAL HYSTERECTOMY    . BACK SURGERY    . CATARACT EXTRACTION, BILATERAL    . CHOLECYSTECTOMY    . ESOPHAGOGASTRODUODENOSCOPY  07/16/2012   Procedure: ESOPHAGOGASTRODUODENOSCOPY (EGD);  Surgeon: Inda Castle, MD;  Location: [facility];  Service: Endoscopy;  Laterality: N/A;  . ESOPHAGOGASTRODUODENOSCOPY N/A 01/20/2018   Procedure: ESOPHAGOGASTRODUODENOSCOPY (EGD);  Surgeon: Ladene Artist, MD;  Location: [facility] ENDOSCOPY;  Service: Endoscopy;  Laterality:  N/A;  . GASTRIC VARICES BANDING  07/16/2012   Procedure: GASTRIC VARICES BANDING;  Surgeon: Inda Castle, MD;  Location: [facility] ENDOSCOPY;  Service: Endoscopy;  Laterality: N/A;  . HIP ARTHROPLASTY Bilateral   . IR GENERIC HISTORICAL  06/25/2016   IR RADIOLOGIST EVAL & MGMT 06/25/2016 [facility]INTER[facility]  . IR GENERIC HISTORICAL  06/27/2016   IR KYPHO LUMBAR INC FX REDUCE BONE BX UNI/BIL CANNULATION INC/IMAGING 06/27/2016 Luanne Bras, MD [facility]  . IR GENERIC HISTORICAL  07/17/2016   IR RADIOLOGIST EVAL & MGMT 07/17/2016 [facility]  . IR PARACENTESIS  01/20/2018  . IR PARACENTESIS  02/27/2018  . IR PARACENTESIS  03/11/2018  . IR PERC TUN PERIT CATH WO PORT S&I Dartha Lodge  03/14/2018  . IR RADIOLOGIST EVAL & MGMT  07/10/2017  . IR RADIOLOGIST EVAL & MGMT  10/01/2017  . IR RADIOLOGIST EVAL & MGMT  12/11/2017  . IR RADIOLOGIST EVAL & MGMT  04/21/2018  . IR THORACENTESIS ASP PLEURAL SPACE W/IMG GUIDE  09/12/2017  . IR THORACENTESIS ASP PLEURAL SPACE W/IMG GUIDE  09/17/2017  . NECK SURGERY    . RADIOFREQUENCY ABLATION N/A 08/30/2017   Procedure: CT MICROWAVE THERMAL ABLATION;  Surgeon: Arne Cleveland, MD;  Location: [facility] ORS;  Service: Anesthesiology;  Laterality: N/A;  . SHOULDER SURGERY Right      OB History   None      Home Medications    Prior to Admission medications   Medication Sig Start Date End Date Taking? Authorizing Provider  albuterol (PROVENTIL HFA;VENTOLIN HFA) 108 (90 Base) MCG/ACT inhaler Inhale 2 puffs into the lungs every 6 (six) hours as needed for wheezing or shortness of breath. 05/27/16   Ward, Delice Bison, DO  allopurinol (ZYLOPRIM) 300 MG tablet take 1 tablet (300 mg) by mouth daily with supper - for gout 03/04/18   Unk Pinto, MD  ALPRAZolam Duanne Moron) 0.25 MG tablet Take 1 tab as needed for anxiety, cramping, air hunger every 8 hours as needed. 11/01/17   Liane Comber, NP  Cholecalciferol (VITAMIN D3) 5000 units CAPS Take 5,000 Units by mouth daily with breakfast.     [provider]  cholestyramine light (PREVALITE) 4 GM/DOSE powder Take 1 packet (4 g total) by mouth 2 (two) times daily with a meal. For itching. 05/08/18   Liane Comber, NP  ciprofloxacin (CIPRO) 500 MG tablet Take 500 mg by mouth. Take one tablet three times a week    [provider]  citalopram (CELEXA) 20 MG tablet Take 1 tablet (20 mg total) by mouth daily. 03/14/18 03/14/19  Florencia Reasons, MD  cyclobenzaprine (FLEXERIL) 10 MG tablet Take 1 tablet (10 mg total) by mouth 3 (three) times daily as needed for muscle spasms. 03/06/16   Unk Pinto, MD  ferrous sulfate 325 (65 FE) MG tablet Take 1 tablet (325 mg total) by mouth daily  with breakfast. 03/01/18   Florencia Reasons, MD  gabapentin (NEURONTIN) 600 MG tablet Take 1 tablet (600 mg total) by mouth at bedtime. 03/14/18   Florencia Reasons, MD  Glycopyrrolate-Formoterol (BEVESPI AEROSPHERE) 9-4.8 MCG/ACT AERO Inhale 2 puffs into the lungs 2 (two) times daily. 12/25/17   Tanda Rockers, MD  hydrOXYzine (ATARAX/VISTARIL) 10 MG tablet TAKE 1 TABLET BY MOUTH EVERY 4 HOURS AS NEEDED FOR ITCHING 05/05/18   Unk Pinto, MD  ipratropium (ATROVENT) 0.02 % nebulizer solution Take 3 mLs by nebulization every 4 (four) hours as needed for wheezing or shortness of breath. 01/29/18   [provider]  lactulose ([place]) 10 GM/15ML solution take 59ms TWICE DAILY Patient taking differently: Take 20 g by mouth daily.  08/08/17   MUnk Pinto MD  Magnesium 250 MG TABS Take 250 mg by mouth at bedtime.     [provider]  metolazone (ZAROXOLYN) 5 MG tablet TAKE 1 TABLET DAILY OR AS DIRECTED FOR FLUID OVERLOAD 04/21/18   CLiane Comber NP  midodrine (PROAMATINE) 5 MG tablet Take 1 tablet 3 x /day with meals 03/31/18   MUnk Pinto MD  ondansetron (ZOFRAN) 4 MG tablet Take 1 tablet (4 mg total) by mouth every 6 (six) hours as needed for nausea or vomiting. 09/02/17   CVicie Mutters PA-C  OXYGEN Inhale 2-3 L into the lungs continuous. 3 L  with exertion    [provider]  pantoprazole (PROTONIX) 40 MG tablet Take 1 tablet (40 mg total) by mouth daily. 03/04/18   MUnk Pinto MD  potassium chloride (K-DUR) 10 MEQ tablet Take 1 tablet (10 mEq total) by mouth every Monday, Wednesday, and Friday. 03/14/18   XFlorencia Reasons MD  rifampin (RIFADIN) 300 MG capsule TAKE ONE CAPSULE BY MOUTH TWICE DAILY FOR LIVER 05/05/18   CVicie Mutters PA-C  spironolactone (ALDACTONE) 25 MG tablet TAKE ONE TABLET BY MOUTH TWICE DAILY 05/05/18   MUnk Pinto MD  traMADol (ULTRAM) 50 MG tablet TAKE 1 TABLET BY MOUTH EVERY 12 HOURS AS NEEDED FOR PAIN FOR CHRONIC PAIN 04/21/18   CLiane Comber NP  vitamin C (ASCORBIC ACID) 500 MG tablet Take 500 mg by mouth 3 (three) times daily.    [provider]    Family History Family History  Problem Relation Age of Onset  . Diabetes Brother        deceased  . Heart disease Sister        A Fib  . Heart disease Mother        CHF  . Atrial fibrillation Sister   . Cancer Father        lung cancer   . Cancer Maternal Uncle        unknown type cancer   . Cancer Other        lung cancer   . Colon cancer Neg Hx     Social History Social History   Tobacco Use  . Smoking status: Former Smoker    Packs/day: 1.00    Years: 50.00    Pack years: 50.00    Types: Cigarettes    Last attempt to quit: 11/05/2014    Years since quitting: 3.5  . Smokeless tobacco: Never Used  . Tobacco comment: Quit April 2016  Substance Use Topics  . Alcohol use: No    Alcohol/week: 0.0 standard drinks  . Drug use: No     Allergies   Atorvastatin; Diphenhydramine hcl (sleep); Hydrocodone-acetaminophen; Lopid [gemfibrozil]; Loratadine; Lorazepam; Simvastatin; Sulfamethoxazole; and Sulfonamide derivatives  Review of Systems Review of Systems  Constitutional: Positive for fatigue. Negative for fever.  Respiratory: Negative for shortness of breath.   Cardiovascular: Negative for chest pain.    Gastrointestinal: Positive for abdominal distention. Negative for abdominal pain.  Neurological: Positive for weakness.  All other systems reviewed and are negative.    Physical Exam Updated Vital Signs BP 127/84 (BP Location: Left Arm)   Pulse (!) 49   Temp 97.9 F (36.6 C) (Oral)   Resp 18   SpO2 97%   Physical Exam  Constitutional: She appears well-developed and well-nourished. No distress.  HENT:  Head: Normocephalic and atraumatic.  Right Ear: External ear normal.  Left Ear: External ear normal.  Nose: Nose normal.  Eyes: Right eye exhibits no discharge. Left eye exhibits no discharge.  Cardiovascular: Normal rate, regular rhythm and normal heart sounds.  Pulmonary/Chest: Effort normal and breath sounds normal.  Abdominal: Soft. She exhibits distension. There is no tenderness.  Neurological: She is alert.  Skin: Skin is warm and dry. She is not diaphoretic.  Psychiatric: Her mood appears not anxious.  Nursing note and vitals reviewed.    ED Treatments / Results  Labs (all labs ordered are listed, but only abnormal results are displayed) Labs Reviewed  BASIC METABOLIC PANEL - Abnormal; Notable for the following components:      Result Value   Sodium 121 (*)    Potassium 7.4 (*)    Chloride 97 (*)    CO2 16 (*)    Glucose, Bld 116 (*)    BUN 60 (*)    Creatinine, Ser 2.14 (*)    Calcium 8.7 (*)    GFR calc non Af Amer 22 (*)    GFR calc Af Amer 26 (*)    All other components within normal limits  CBC - Abnormal; Notable for the following components:   RBC 2.72 (*)    Hemoglobin 8.9 (*)    HCT 29.1 (*)    MCV 107.0 (*)    RDW 15.9 (*)    Platelets 127 (*)    All other components within normal limits  URINALYSIS, ROUTINE W REFLEX MICROSCOPIC  BASIC METABOLIC PANEL  PROTIME-INR  AMMONIA    EKG EKG Interpretation  Date/Time:  Friday May 09 2018 09:52:13 EDT Ventricular Rate:  68 PR Interval:    QRS Duration: 142 QT Interval:  422 QTC  Calculation: 448 R Axis:   -22 Text Interpretation:  Wide QRS rhythm with Premature supraventricular complexes Right bundle branch block Abnormal ECG Confirmed by Sherwood Gambler [phone]) on 05/09/2018 11:57:21 AM   Radiology No results found.  Procedures .Critical Care Performed by: Sherwood Gambler, MD Authorized by: Sherwood Gambler, MD   Critical care provider statement:    Critical care time (minutes):  35   Critical care time was exclusive of:  Separately billable procedures and treating other patients   Critical care was necessary to treat or prevent imminent or life-threatening deterioration of the following conditions:  Cardiac failure and metabolic crisis   Critical care was time spent personally by me on the following activities:  Development of treatment plan with patient or surrogate, discussions with consultants, evaluation of patient's response to treatment, examination of patient, obtaining history from patient or surrogate, ordering and performing treatments and interventions, ordering and review of laboratory studies, pulse oximetry, re-evaluation of patient's condition and review of old charts   (including critical care time)  Angiocath insertion Performed by: Ephraim Hamburger  Consent: Verbal consent obtained.  Risks and benefits: risks, benefits and alternatives were discussed Time out: Immediately prior to procedure a "time out" was called to verify the correct patient, procedure, equipment, support staff and site/side marked as required.  Preparation: Patient was prepped and draped in the usual sterile fashion.  Vein Location: left basilic  Ultrasound Guided  Gauge: 20  Normal blood return and flush without difficulty Patient tolerance: Patient tolerated the procedure well with no immediate complications.     Medications Ordered in ED Medications  calcium gluconate 1 g in sodium chloride 0.9 % 100 mL IVPB (has no administration in time range)  albuterol  (PROVENTIL) (2.5 MG/3ML) 0.083% nebulizer solution 10 mg (has no administration in time range)  insulin aspart (novoLOG) injection 5 Units (has no administration in time range)    And  dextrose 50 % solution 50 mL (has no administration in time range)  sodium chloride 0.9 % bolus 1,000 mL (has no administration in time range)  patiromer Daryll Drown) packet 8.4 g (has no administration in time range)  ondansetron (ZOFRAN) tablet 4 mg (has no administration in time range)    Or  ondansetron (ZOFRAN) injection 4 mg (has no administration in time range)  lactated ringers infusion (has no administration in time range)     Initial Impression / Assessment and Plan / ED Course  I have reviewed the triage vital signs and the nursing notes.  Pertinent labs & imaging results that were available during my care of the patient were reviewed by me and considered in my medical decision making (see chart for details).     Patient's potassium is over 7.  She has some intermittent bradycardia.  Otherwise her blood pressures okay and she does not appear acutely ill.  She is chronically ill with chronic hospice.  ECG shows chronic widening of her QRS but also some intermittent bradycardia and irregular heart rate.  She was treated with IV calcium, insulin, glucose, and albuterol. I discussed with Dr. Johnney Ou, nephrology, who recommends giving patiromer in addition to the other treatments.  She will need admission.  I do wonder if her kidney injury is related to fluid being drawn off her ascites frequently and so she will be given IVF. We did discuss her CODE STATUS and she is DNR/DNI but would want other treatments up until this point.  Dr. Olevia Bowens to admit.  Final Clinical Impressions(s) / ED Diagnoses   Final diagnoses:  Hyperkalemia  Acute kidney injury [facility])    ED Discharge Orders    None       Sherwood Gambler, MD 05/09/18 1603    Sherwood Gambler, MD 05/09/18 [id number]

## 2018-05-09 NOTE — ED Notes (Signed)
K_ 7.4 noted, will prioritize rooming

## 2018-05-09 NOTE — Telephone Encounter (Signed)
Per Dr Melford Aase, the patient's spouse was notified to take the patient to the ED due to abnormal sodium and potassium. The spouse expressed they would go ASAP.

## 2018-05-09 NOTE — ED Triage Notes (Signed)
Pt presents for evaluation of elevated potassium levels from PCP. Pt is hospice patient. States she has generalized weakness which is typical of when she has potassium issues.

## 2018-05-09 NOTE — H&P (Signed)
History and Physical    [name] JFH:[id number] DOB: Dec 19, 1945 DOA: 05/09/2018  PCP: Unk Pinto, MD   Patient coming from: Home.  I have personally briefly reviewed patient's old medical records in [place]  Chief Complaint: High potassium.  HPI: [name] is a 72 y.o. female with medical history significant of asthma/COPD, liver cirrhosis, esophageal varices, hepatocellular carcinoma, history of hepatic encephalopathy, splenomegaly, GERD, depression, type 2 diabetes, hyperlipidemia, hypertension, IBS, history of left leg phlebitis, history of pneumonia, vitamin D deficiency who is sent to the emergency department by his PCP office after she had lab work drawn yesterday which showed a critical high potassium level.  She denies fever, chills, sore throat, productive cough, wheezing, chest pain palpitations, dizziness, diaphoresis, PND or orthopnea.  She occasionally gets lower extremity edema, but does not have any fluid retention on her lower extremities at this time.  She complains of nausea, but denies abdominal pain, emesis, diarrhea, constipation, melena or hematochezia.  She denies dysuria, frequency or hematuria.  She complains of easy bruising, but denies pruritus or skin rashes.  No polyuria, polydipsia, polyphagia or blurred vision.  ED Course: Initial vital signs temperature 97.9 F, pulse 49, respirations 20, blood pressure 125/41 mmHg and O2 sat 97% on room air. She was given an albuterol 10 mg neb treatment, a gram of calcium gluconate, 50 mL's of D50, 5 units of insulin IV and a packet of Veltasa.  Her work-up white count 7.1, hemoglobin 8.9 g/dL and platelets 127.  Sodium 121, potassium 7.4, chloride 97 CO2 16 mmol/L.  Her glucose is 116, BUN 60, creatinine 2.14 and calcium 8.7 mg/dL.    Review of Systems: As per HPI otherwise 10 point review of systems negative.   Past Medical History:  Diagnosis Date  . Asthma   . [facility]    liver  cancer   . COPD (chronic obstructive pulmonary disease) ([place])    x 3 years   . Depression   . Diabetes mellitus without complication ([place])    diet controlled  not on meds   . Edema    left leg   . Esophageal varices ([place])   . Fibromyalgia   . Gastritis   . GERD (gastroesophageal reflux disease)   . Hepatic cirrhosis ([place])   . Hepatic encephalopathy ([place])   . History of blood transfusion   . Hyperlipidemia   . Hypertension   . IBS (irritable bowel syndrome)   . Phlebitis    left leg x 2   . Pneumonia    hx of   . Splenomegaly   . Vitamin D deficiency     Past Surgical History:  Procedure Laterality Date  . ABDOMINAL HYSTERECTOMY    . BACK SURGERY    . CATARACT EXTRACTION, BILATERAL    . CHOLECYSTECTOMY    . ESOPHAGOGASTRODUODENOSCOPY  07/16/2012   Procedure: ESOPHAGOGASTRODUODENOSCOPY (EGD);  Surgeon: Inda Castle, MD;  Location: [facility];  Service: Endoscopy;  Laterality: N/A;  . ESOPHAGOGASTRODUODENOSCOPY N/A 01/20/2018   Procedure: ESOPHAGOGASTRODUODENOSCOPY (EGD);  Surgeon: Ladene Artist, MD;  Location: [facility] ENDOSCOPY;  Service: Endoscopy;  Laterality: N/A;  . GASTRIC VARICES BANDING  07/16/2012   Procedure: GASTRIC VARICES BANDING;  Surgeon: Inda Castle, MD;  Location: [facility] ENDOSCOPY;  Service: Endoscopy;  Laterality: N/A;  . HIP ARTHROPLASTY Bilateral   . IR GENERIC HISTORICAL  06/25/2016   IR RADIOLOGIST EVAL & MGMT 06/25/2016 [facility]  . IR GENERIC HISTORICAL  06/27/2016  IR KYPHO LUMBAR INC FX REDUCE BONE BX UNI/BIL CANNULATION INC/IMAGING 06/27/2016 Luanne Bras, MD [facility]  . IR GENERIC HISTORICAL  07/17/2016   IR RADIOLOGIST EVAL & MGMT 07/17/2016 [facility]  . IR PARACENTESIS  01/20/2018  . IR PARACENTESIS  02/27/2018  . IR PARACENTESIS  03/11/2018  . IR PERC TUN PERIT CATH WO PORT S&I Dartha Lodge  03/14/2018  . IR RADIOLOGIST EVAL & MGMT  07/10/2017  . IR RADIOLOGIST EVAL & MGMT  10/01/2017  . IR RADIOLOGIST EVAL & MGMT  12/11/2017  . IR  RADIOLOGIST EVAL & MGMT  04/21/2018  . IR THORACENTESIS ASP PLEURAL SPACE W/IMG GUIDE  09/12/2017  . IR THORACENTESIS ASP PLEURAL SPACE W/IMG GUIDE  09/17/2017  . NECK SURGERY    . RADIOFREQUENCY ABLATION N/A 08/30/2017   Procedure: CT MICROWAVE THERMAL ABLATION;  Surgeon: Arne Cleveland, MD;  Location: [facility] ORS;  Service: Anesthesiology;  Laterality: N/A;  . SHOULDER SURGERY Right      reports that she quit smoking about 3 years ago. Her smoking use included cigarettes. She has a 50.00 pack-year smoking history. She has never used smokeless tobacco. She reports that she does not drink alcohol or use drugs.  Allergies  Allergen Reactions  . Atorvastatin Other (See Comments)    Unknown reaction  . Diphenhydramine Hcl (Sleep) Hives  . Hydrocodone-Acetaminophen Other (See Comments)    Unknown reaction  . Lopid [Gemfibrozil] Other (See Comments)    Unknown reaction  . Loratadine Hives  . Lorazepam Hives  . Simvastatin Other (See Comments)    Unknown reaction  . Sulfamethoxazole Hives  . Sulfonamide Derivatives Hives    Family History  Problem Relation Age of Onset  . Diabetes Brother        deceased  . Heart disease Sister        A Fib  . Heart disease Mother        CHF  . Atrial fibrillation Sister   . Cancer Father        lung cancer   . Cancer Maternal Uncle        unknown type cancer   . Cancer Other        lung cancer   . Colon cancer Neg Hx     Prior to Admission medications   Medication Sig Start Date End Date Taking? Authorizing Provider  albuterol (PROVENTIL HFA;VENTOLIN HFA) 108 (90 Base) MCG/ACT inhaler Inhale 2 puffs into the lungs every 6 (six) hours as needed for wheezing or shortness of breath. 05/27/16   Ward, Delice Bison, DO  allopurinol (ZYLOPRIM) 300 MG tablet take 1 tablet (300 mg) by mouth daily with supper - for gout 03/04/18   Unk Pinto, MD  ALPRAZolam Duanne Moron) 0.25 MG tablet Take 1 tab as needed for anxiety, cramping, air hunger every 8 hours as  needed. 11/01/17   Liane Comber, NP  Cholecalciferol (VITAMIN D3) 5000 units CAPS Take 5,000 Units by mouth daily with breakfast.    [provider]  cholestyramine light (PREVALITE) 4 GM/DOSE powder Take 1 packet (4 g total) by mouth 2 (two) times daily with a meal. For itching. 05/08/18   Liane Comber, NP  ciprofloxacin (CIPRO) 500 MG tablet Take 500 mg by mouth. Take one tablet three times a week    [provider]  citalopram (CELEXA) 20 MG tablet Take 1 tablet (20 mg total) by mouth daily. 03/14/18 03/14/19  Florencia Reasons, MD  cyclobenzaprine (FLEXERIL) 10 MG tablet Take 1 tablet (10 mg total)  by mouth 3 (three) times daily as needed for muscle spasms. 03/06/16   Unk Pinto, MD  ferrous sulfate 325 (65 FE) MG tablet Take 1 tablet (325 mg total) by mouth daily with breakfast. 03/01/18   Florencia Reasons, MD  gabapentin (NEURONTIN) 600 MG tablet Take 1 tablet (600 mg total) by mouth at bedtime. 03/14/18   Florencia Reasons, MD  Glycopyrrolate-Formoterol (BEVESPI AEROSPHERE) 9-4.8 MCG/ACT AERO Inhale 2 puffs into the lungs 2 (two) times daily. 12/25/17   Tanda Rockers, MD  hydrOXYzine (ATARAX/VISTARIL) 10 MG tablet TAKE 1 TABLET BY MOUTH EVERY 4 HOURS AS NEEDED FOR ITCHING 05/05/18   Unk Pinto, MD  ipratropium (ATROVENT) 0.02 % nebulizer solution Take 3 mLs by nebulization every 4 (four) hours as needed for wheezing or shortness of breath. 01/29/18   [provider]  lactulose ([place]) 10 GM/15ML solution take 22ms TWICE DAILY Patient taking differently: Take 20 g by mouth daily.  08/08/17   MUnk Pinto MD  Magnesium 250 MG TABS Take 250 mg by mouth at bedtime.     [provider]  metolazone (ZAROXOLYN) 5 MG tablet TAKE 1 TABLET DAILY OR AS DIRECTED FOR FLUID OVERLOAD 04/21/18   CLiane Comber NP  midodrine (PROAMATINE) 5 MG tablet Take 1 tablet 3 x /day with meals 03/31/18   MUnk Pinto MD  ondansetron (ZOFRAN) 4 MG tablet Take 1 tablet (4 mg total) by mouth  every 6 (six) hours as needed for nausea or vomiting. 09/02/17   CVicie Mutters PA-C  OXYGEN Inhale 2-3 L into the lungs continuous. 3 L with exertion    [provider]  pantoprazole (PROTONIX) 40 MG tablet Take 1 tablet (40 mg total) by mouth daily. 03/04/18   MUnk Pinto MD  potassium chloride (K-DUR) 10 MEQ tablet Take 1 tablet (10 mEq total) by mouth every Monday, Wednesday, and Friday. 03/14/18   XFlorencia Reasons MD  rifampin (RIFADIN) 300 MG capsule TAKE ONE CAPSULE BY MOUTH TWICE DAILY FOR LIVER 05/05/18   CVicie Mutters PA-C  spironolactone (ALDACTONE) 25 MG tablet TAKE ONE TABLET BY MOUTH TWICE DAILY 05/05/18   MUnk Pinto MD  traMADol (ULTRAM) 50 MG tablet TAKE 1 TABLET BY MOUTH EVERY 12 HOURS AS NEEDED FOR PAIN FOR CHRONIC PAIN 04/21/18   CLiane Comber NP  vitamin C (ASCORBIC ACID) 500 MG tablet Take 500 mg by mouth 3 (three) times daily.    [provider]    Physical Exam: Vitals:   05/09/18 0952 05/09/18 1147 05/09/18 1200 05/09/18 1249  BP: (!) 125/41 127/84 129/75   Pulse: 97 (!) 49 (!) 51 69  Resp: 20 18 12 14   Temp: 97.9 F (36.6 C)     TempSrc: Oral     SpO2: 97% 97% 100% 100%    Constitutional: NAD, calm, comfortable Eyes: PERRL, lids and conjunctivae normal.  No scleral icterus. ENMT: Mucous membranes are mildly dry. Posterior pharynx clear of any exudate or lesions. Neck: normal, supple, no masses, no thyromegaly Respiratory: Decreased breath sounds on bases, otherwise clear to auscultation bilaterally, no wheezing, no crackles. Normal respiratory effort. No accessory muscle use.  Cardiovascular: Bradycardic at 56 bpm, no murmurs / rubs / gallops.  Trace lower extremities pitting edema. 2+ pedal pulses. No carotid bruits.  Abdomen: Right peritoneal catheter drain, no distention, soft, no tenderness, no masses palpated. No hepatosplenomegaly. Bowel sounds positive.  Musculoskeletal: no clubbing / cyanosis. Good ROM, no contractures. Normal  muscle tone.  Skin: Multiple areas of ecchymosis on extremities.  Hyperpigmentation and hyperkeratosis of ankles, left >> right. Neurologic: CN 2-12 grossly intact. Sensation intact, DTR normal. Strength 5/5 in all 4.  Psychiatric: Normal judgment and insight. Alert and oriented x 4.. Normal mood.    Labs on Admission: I have personally reviewed following labs and imaging studies  CBC: Recent Labs  Lab 05/08/18 1135 05/09/18 1004  WBC 7.7 7.1  NEUTROABS 5,898  --   HGB 9.5* 8.9*  HCT 28.3* 29.1*  MCV 97.9 107.0*  PLT 147 756*   Basic Metabolic Panel: Recent Labs  Lab 05/08/18 1135 05/09/18 1004  NA 119* 121*  K 7.8* 7.4*  CL 96* 97*  CO2 17* 16*  GLUCOSE 96 116*  BUN 62* 60*  CREATININE 1.89* 2.14*  CALCIUM 8.8 8.7*   GFR: CrCl cannot be calculated (Unknown ideal weight.). Liver Function Tests: Recent Labs  Lab 05/08/18 1135  AST 37*  ALT 26  BILITOT 0.8  PROT 4.6*   No results for input(s): LIPASE, AMYLASE in the last 168 hours. Recent Labs  Lab 05/08/18 1135  AMMONIA 49   Coagulation Profile: No results for input(s): INR, PROTIME in the last 168 hours. Cardiac Enzymes: No results for input(s): CKTOTAL, CKMB, CKMBINDEX, TROPONINI in the last 168 hours. BNP (last 3 results) No results for input(s): PROBNP in the last 8760 hours. HbA1C: No results for input(s): HGBA1C in the last 72 hours. CBG: No results for input(s): GLUCAP in the last 168 hours. Lipid Profile: No results for input(s): CHOL, HDL, LDLCALC, TRIG, CHOLHDL, LDLDIRECT in the last 72 hours. Thyroid Function Tests: No results for input(s): TSH, T4TOTAL, FREET4, T3FREE, THYROIDAB in the last 72 hours. Anemia Panel: No results for input(s): VITAMINB12, FOLATE, FERRITIN, TIBC, IRON, RETICCTPCT in the last 72 hours. Urine analysis:    Component Value Date/Time   COLORURINE AMBER (A) 03/11/2018 2329   APPEARANCEUR HAZY (A) 03/11/2018 2329   LABSPEC 1.013 03/11/2018 2329   PHURINE 5.0  03/11/2018 2329   GLUCOSEU NEGATIVE 03/11/2018 2329   HGBUR NEGATIVE 03/11/2018 2329   BILIRUBINUR NEGATIVE 03/11/2018 2329   KETONESUR NEGATIVE 03/11/2018 2329   PROTEINUR NEGATIVE 03/11/2018 2329   UROBILINOGEN 1 01/13/2015 1145   NITRITE NEGATIVE 03/11/2018 2329   LEUKOCYTESUR NEGATIVE 03/11/2018 2329    Radiological Exams on Admission: No results found.  Echo 09/09/2017 ------------------------------------------------------------------- LV EF: 60% -   65%  ------------------------------------------------------------------- Indications:      CHF - 428.0.  ------------------------------------------------------------------- History:   PMH:   Chronic obstructive pulmonary disease.  Risk factors:  Current tobacco use. Hypertension. Diabetes mellitus. Dyslipidemia.  ------------------------------------------------------------------- Study Conclusions  - Left ventricle: The cavity size was normal. Wall thickness was   normal. Systolic function was normal. The estimated ejection   fraction was in the range of 60% to 65%. Wall motion was normal;   there were no regional wall motion abnormalities. Features are   consistent with a pseudonormal left ventricular filling pattern,   with concomitant abnormal relaxation and increased filling   pressure (grade 2 diastolic dysfunction). - Aortic valve: Valve area (VTI): 1.83 cm^2. Valve area (Vmax):   2.18 cm^2. Valve area (Vmean): 1.68 cm^2. - Right atrium: The atrium was mildly dilated.  EKG: Independently reviewed. Vent. rate 68 BPM PR interval * ms QRS duration 142 ms QT/QTc 422/448 ms P-R-T axes * -22 45 Wide QRS rhythm with Premature supraventricular complexes Right bundle branch block Abnormal ECG  Assessment/Plan Principal Problem:   Hyperkalemia Observation/telemetry. Hold potassium supplements. Hold spironolactone. Monitor potassium closely.  Active  Problems:   Hyponatremia Continue NS infusion. Follow-up  sodium level.    Acute renal failure superimposed on stage III chronic kidney disease ([place]) Secondary to diuretics. Hold the spironolactone and metolazone. Continue normal saline infusion. Monitor intake and output. Follow-up sodium level.    Chronic diastolic CHF (congestive heart failure) (H[facility]) No signs of decompensation at this time. Actually the patient seems to be volume depleted.    Hyperlipidemia Not on medical therapy due to liver disease.    GERD Continue Protonix.    Thrombocytopenia ([facility]) Monitor platelets.    Iron deficiency anemia due to chronic blood loss Monitor hematocrit and hemoglobin.    COPD GOLD IV/ 02 dep  Continue supplemental oxygen. Bronchodilators as needed.   DVT prophylaxis: SCDs. Code Status: Full code. Family Communication: Her husband was in the ED room. Disposition Plan: Admit for hyperkalemia and hyponatremia treatment. Consults called: Admission status: Inpatient/telemetry.   Reubin Milan MD [facility] Pager [phone].  If 7PM-7AM, please contact night-coverage [email or web address] Password [facility]  05/09/2018, 1:19 PM   This document was prepared using Dragon voice recognition software and may contain some unintended transcription errors.

## 2018-05-09 NOTE — ED Notes (Signed)
Called Pt to reassess. No Answer

## 2018-05-09 NOTE — ED Notes (Signed)
RN attempted 3 times to get a line. MD aware

## 2018-05-09 NOTE — Progress Notes (Signed)
[facility])  RN ED Visit  Notified by [facility] RN that pt was going to ED after visiting PCP for lab work yesterday.  Pt has been feeling weak, tired and nauseated.  PCP called this morning advising pt that her lab work from yesterday revealed critical values and to report to the ED ASAP.  Pt endorses weakness, spouse present stating she was unable to stand today.   We will continue to follow while she is in the hospital.  Please call with any hospice related questions or concerns.  Thank you, Venia Carbon BSN, [facility] Liaison (listed in [place]) [phone]

## 2018-05-09 NOTE — Progress Notes (Signed)
RN states no longer needed IV team, IV already placed

## 2018-05-09 NOTE — ED Notes (Signed)
Called pt x3 for vitals. No answer. Pt may be in A2.

## 2018-05-09 NOTE — ED Notes (Signed)
Report given to [facility]

## 2018-05-09 NOTE — Consult Note (Signed)
[facility] Renal Consultation Note  Requesting MD:  Dr. Regenia Skeeter Indication for Consultation: hyperkalemia  HPI: [name] is a 72 y.o. female with asthma/COPD, cirrhosis, HCCa, GERD, depression, DM, HL, HTN, IBS who is seen in consultation for hyperkalemia.    She had monthly labs done with her PCP yesterday and was called into the ED today after K 7.8.  She has been feeling muscular weakness over the past few day.  She has a chronic palliative peritoneal drain and lately has been draining less - used to do 2L QOD and 1L in between but lately has been only doing about 1L/day due to pain when she tries to drain more. Abd has been much less distended.  Take KCl supplement QOD.  She has h/o LE edema but this is improved.  Feels "dry all of the time" and tries to drink water.  Hoarse voice, no fevers, chills, cough.  Feels breathing at baseline.   She has had home hospice 3d/wk visits for the past month.    In the ED she was treated with albuterol, IV ca, IV insulin and dextrose and 1 pack of veltassa.  She also rec'd 1.5L boluses NS.   Creatinine  Date/Time Value Ref Range Status  07/10/2017 12:23 PM 1.0 0.6 - 1.1 mg/dL Final   Creat  Date/Time Value Ref Range Status  05/08/2018 11:35 AM 1.89 (H) 0.60 - 0.93 mg/dL Final    Comment:    For patients >12 years of age, the reference limit for Creatinine is approximately 13% higher for people identified as African-American. Marland Kitchen   03/18/2018 11:09 AM 1.34 (H) 0.60 - 0.93 mg/dL Final    Comment:    For patients >75 years of age, the reference limit for Creatinine is approximately 13% higher for people identified as African-American. Marland Kitchen   03/03/2018 04:59 PM 1.28 (H) 0.60 - 0.93 mg/dL Final    Comment:    For patients >32 years of age, the reference limit for Creatinine is approximately 13% higher for people identified as African-American. Marland Kitchen   01/27/2018 03:19 PM 1.22 (H) 0.60 - 0.93 mg/dL Final    Comment:    For  patients >15 years of age, the reference limit for Creatinine is approximately 13% higher for people identified as African-American. .   01/15/2018 05:33 PM 1.29 (H) 0.60 - 0.93 mg/dL Final    Comment:    For patients >35 years of age, the reference limit for Creatinine is approximately 13% higher for people identified as African-American. .   12/05/2017 03:19 PM 1.34 (H) 0.60 - 0.93 mg/dL Final    Comment:    For patients >74 years of age, the reference limit for Creatinine is approximately 13% higher for people identified as African-American. Marland Kitchen   10/03/2017 04:45 PM 1.41 (H) 0.60 - 0.93 mg/dL Final    Comment:    For patients >72 years of age, the reference limit for Creatinine is approximately 13% higher for people identified as African-American. Marland Kitchen   09/03/2017 03:47 PM 1.10 (H) 0.60 - 0.93 mg/dL Final    Comment:    For patients >61 years of age, the reference limit for Creatinine is approximately 13% higher for people identified as African-American. .   05/31/2017 11:57 AM 1.54 (H) 0.60 - 0.93 mg/dL Final    Comment:    For patients >16 years of age, the reference limit for Creatinine is approximately 13% higher for people identified as African-American. Marland Kitchen   05/01/2017 11:29 AM 1.43 (H)  0.60 - 0.93 mg/dL Final    Comment:    For patients >60 years of age, the reference limit for Creatinine is approximately 13% higher for people identified as African-American. Marland Kitchen   02/26/2017 04:02 PM 1.18 (H) 0.60 - 0.93 mg/dL Final    Comment:      For patients > or = 71 years of age: The upper reference limit for Creatinine is approximately 13% higher for people identified as African-American.     01/24/2017 05:16 PM 1.31 (H) 0.60 - 0.93 mg/dL Final    Comment:      For patients > or = 71 years of age: The upper reference limit for Creatinine is approximately 13% higher for people identified as African-American.     01/21/2017 10:42 AM 1.14 (H) 0.60 - 0.93 mg/dL  Final    Comment:      For patients > or = 71 years of age: The upper reference limit for Creatinine is approximately 13% higher for people identified as African-American.     01/11/2017 10:52 AM 1.21 (H) 0.60 - 0.93 mg/dL Final    Comment:      For patients > or = 71 years of age: The upper reference limit for Creatinine is approximately 13% higher for people identified as African-American.     12/20/2016 04:45 PM 1.15 (H) 0.60 - 0.93 mg/dL Final    Comment:      For patients > or = 71 years of age: The upper reference limit for Creatinine is approximately 13% higher for people identified as African-American.     12/17/2016 04:13 PM 1.34 (H) 0.60 - 0.93 mg/dL Final    Comment:      For patients > or = 71 years of age: The upper reference limit for Creatinine is approximately 13% higher for people identified as African-American.     12/04/2016 05:11 PM 1.30 (H) 0.60 - 0.93 mg/dL Final    Comment:      For patients > or = 71 years of age: The upper reference limit for Creatinine is approximately 13% higher for people identified as African-American.     10/18/2016 02:47 PM 1.00 (H) 0.50 - 0.99 mg/dL Final    Comment:      For patients > or = 71 years of age: The upper reference limit for Creatinine is approximately 13% higher for people identified as African-American.     08/29/2016 12:19 PM 1.14 (H) 0.50 - 0.99 mg/dL Final    Comment:      For patients > or = 71 years of age: The upper reference limit for Creatinine is approximately 13% higher for people identified as African-American.     07/19/2016 12:26 PM 1.11 (H) 0.50 - 0.99 mg/dL Final    Comment:      For patients > or = 71 years of age: The upper reference limit for Creatinine is approximately 13% higher for people identified as African-American.     06/26/2016 12:05 PM 1.26 (H) 0.50 - 0.99 mg/dL Final    Comment:      For patients > or = 71 years of age: The upper reference limit  for Creatinine is approximately 13% higher for people identified as African-American.     05/22/2016 04:09 PM 1.04 (H) 0.50 - 0.99 mg/dL Final    Comment:      For patients > or = 71 years of age: The upper reference limit for Creatinine is approximately 13% higher for people identified as African-American.  04/19/2016 11:59 AM 1.27 (H) 0.50 - 0.99 mg/dL Final    Comment:      For patients > or = 71 years of age: The upper reference limit for Creatinine is approximately 13% higher for people identified as African-American.     03/02/2016 11:07 AM 1.14 (H) 0.50 - 0.99 mg/dL Final    Comment:      For patients > or = 71 years of age: The upper reference limit for Creatinine is approximately 13% higher for people identified as African-American.     02/28/2016 05:57 PM 1.25 (H) 0.50 - 0.99 mg/dL Final    Comment:      For patients > or = 71 years of age: The upper reference limit for Creatinine is approximately 13% higher for people identified as African-American.     02/20/2016 05:44 PM 1.24 (H) 0.50 - 0.99 mg/dL Final    Comment:      For patients > or = 71 years of age: The upper reference limit for Creatinine is approximately 13% higher for people identified as African-American.     02/16/2016 03:49 PM 1.20 (H) 0.50 - 0.99 mg/dL Final    Comment:      For patients > or = 71 years of age: The upper reference limit for Creatinine is approximately 13% higher for people identified as African-American.     02/13/2016 04:03 PM 1.22 (H) 0.50 - 0.99 mg/dL Final    Comment:      For patients > or = 71 years of age: The upper reference limit for Creatinine is approximately 13% higher for people identified as African-American.     01/31/2016 04:42 PM 1.06 (H) 0.50 - 0.99 mg/dL Final    Comment:      For patients > or = 70 years of age: The upper reference limit for Creatinine is approximately 13% higher for people identified as African-American.      01/18/2016 11:34 AM 1.24 (H) 0.50 - 0.99 mg/dL Final    Comment:      For patients > or = 72 years of age: The upper reference limit for Creatinine is approximately 13% higher for people identified as African-American.     11/14/2015 11:41 AM 1.56 (H) 0.50 - 0.99 mg/dL Final  10/12/2015 01:13 PM 1.43 (H) 0.50 - 0.99 mg/dL Final  10/05/2015 12:15 PM 1.27 (H) 0.50 - 0.99 mg/dL Final  09/27/2015 05:55 PM 1.25 (H) 0.50 - 0.99 mg/dL Final  09/13/2015 04:26 PM 1.13 (H) 0.50 - 0.99 mg/dL Final  08/16/2015 04:29 PM 1.09 (H) 0.50 - 0.99 mg/dL Final  07/28/2015 11:59 AM 1.18 (H) 0.50 - 0.99 mg/dL Final  07/22/2015 12:18 PM 1.14 (H) 0.50 - 0.99 mg/dL Final  06/14/2015 12:14 PM 0.96 0.50 - 0.99 mg/dL Final  05/11/2015 12:19 PM 1.09 (H) 0.50 - 0.99 mg/dL Final  05/04/2015 12:44 PM 0.85 0.50 - 0.99 mg/dL Final  04/15/2015 12:05 PM 1.11 (H) 0.50 - 0.99 mg/dL Final  02/16/2015 04:12 PM 1.00 0.50 - 1.10 mg/dL Final  01/13/2015 11:45 AM 1.30 (H) 0.50 - 1.10 mg/dL Final  12/17/2014 12:04 PM 0.85 0.50 - 1.10 mg/dL Final  09/21/2014 11:52 AM 0.86 0.50 - 1.10 mg/dL Final  06/17/2014 03:28 PM 0.76 0.50 - 1.10 mg/dL Final  03/08/2014 11:20 AM 0.89 0.50 - 1.10 mg/dL Final  12/01/2013 04:29 PM 0.72 0.50 - 1.10 mg/dL Final  10/13/2013 02:41 PM 0.81 0.50 - 1.10 mg/dL Final  08/28/2013 11:53 AM 0.87 0.50 - 1.10 mg/dL Final   Creatinine,  Ser  Date/Time Value Ref Range Status  05/09/2018 02:41 PM 2.26 (H) 0.44 - 1.00 mg/dL Final  05/09/2018 10:04 AM 2.14 (H) 0.44 - 1.00 mg/dL Final  03/28/2018 03:07 PM 1.18 0.40 - 1.20 mg/dL Final  03/22/2018 04:55 PM 1.43 (H) 0.44 - 1.00 mg/dL Final  03/14/2018 04:06 AM 1.59 (H) 0.44 - 1.00 mg/dL Final  03/13/2018 05:26 AM 1.58 (H) 0.44 - 1.00 mg/dL Final  03/11/2018 03:55 AM 1.44 (H) 0.44 - 1.00 mg/dL Final  03/10/2018 06:03 PM 1.45 (H) 0.44 - 1.00 mg/dL Final  03/07/2018 06:15 PM 1.46 (H) 0.44 - 1.00 mg/dL Final  03/01/2018 03:15 AM 1.73 (H) 0.44 - 1.00 mg/dL Final   02/28/2018 04:53 AM 1.83 (H) 0.44 - 1.00 mg/dL Final  02/27/2018 05:41 AM 2.26 (H) 0.44 - 1.00 mg/dL Final  02/26/2018 08:45 AM 2.07 (H) 0.44 - 1.00 mg/dL Final  02/25/2018 03:38 PM 2.21 (H) 0.44 - 1.00 mg/dL Final  02/19/2018 02:47 PM 1.76 (H) 0.40 - 1.20 mg/dL Final  01/21/2018 06:47 AM 1.37 (H) 0.44 - 1.00 mg/dL Final  01/20/2018 08:13 AM 1.34 (H) 0.44 - 1.00 mg/dL Final  01/20/2018 12:18 AM 1.28 (H) 0.44 - 1.00 mg/dL Final  01/19/2018 04:09 PM 1.50 (H) 0.44 - 1.00 mg/dL Final  12/10/2017 03:33 PM 1.27 (H) 0.40 - 1.20 mg/dL Final  11/14/2017 03:11 PM 1.36 (H) 0.60 - 1.10 mg/dL Final  10/18/2017 03:59 PM 1.20 0.40 - 1.20 mg/dL Final  09/28/2017 10:25 AM 1.45 (H) 0.44 - 1.00 mg/dL Final  09/27/2017 02:56 PM 1.38 (H) 0.44 - 1.00 mg/dL Final  09/25/2017 03:04 AM 1.36 (H) 0.44 - 1.00 mg/dL Final  09/24/2017 02:18 AM 1.31 (H) 0.44 - 1.00 mg/dL Final  09/23/2017 05:42 AM 1.30 (H) 0.44 - 1.00 mg/dL Final  09/22/2017 02:50 AM 1.32 (H) 0.44 - 1.00 mg/dL Final  09/21/2017 05:02 AM 1.03 (H) 0.44 - 1.00 mg/dL Final  09/20/2017 04:06 AM 1.14 (H) 0.44 - 1.00 mg/dL Final  09/19/2017 02:20 PM 1.24 (H) 0.44 - 1.00 mg/dL Final  09/18/2017 02:08 AM 1.31 (H) 0.44 - 1.00 mg/dL Final  09/17/2017 04:07 AM 1.23 (H) 0.44 - 1.00 mg/dL Final  09/16/2017 12:53 PM 1.33 (H) 0.44 - 1.00 mg/dL Final  09/11/2017 08:57 AM 1.29 (H) 0.44 - 1.00 mg/dL Final  09/09/2017 07:12 AM 1.35 (H) 0.44 - 1.00 mg/dL Final  09/08/2017 11:07 AM 1.29 (H) 0.44 - 1.00 mg/dL Final  09/08/2017 09:36 AM 1.26 (H) 0.44 - 1.00 mg/dL Final  09/07/2017 08:32 PM 1.27 (H) 0.44 - 1.00 mg/dL Final  09/07/2017 02:28 PM 1.26 (H) 0.44 - 1.00 mg/dL Final  08/31/2017 03:20 AM 1.19 (H) 0.44 - 1.00 mg/dL Final  08/27/2017 02:57 PM 1.11 (H) 0.44 - 1.00 mg/dL Final  05/31/2017 12:57 PM 1.38 (H) 0.40 - 1.20 mg/dL Final  12/11/2016 04:02 PM 1.24 (H) 0.40 - 1.20 mg/dL Final  12/07/2016 08:20 PM 1.18 (H) 0.44 - 1.00 mg/dL Final  12/02/2016 05:13 AM 1.49  (H) 0.44 - 1.00 mg/dL Final  12/01/2016 03:48 AM 1.47 (H) 0.44 - 1.00 mg/dL Final  11/30/2016 04:44 PM 1.32 (H) 0.44 - 1.00 mg/dL Final  11/11/2016 01:56 AM 1.28 (H) 0.44 - 1.00 mg/dL Final  11/10/2016 03:25 AM 1.25 (H) 0.44 - 1.00 mg/dL Final  11/07/2016 06:23 PM 1.26 (H) 0.44 - 1.00 mg/dL Final  10/14/2016 06:11 AM 1.30 (H) 0.44 - 1.00 mg/dL Final     PMHx:   Past Medical History:  Diagnosis Date  . Asthma   . Cancer ([place])  liver cancer   . COPD (chronic obstructive pulmonary disease) ([place])    x 3 years   . Depression   . Diabetes mellitus without complication ([place])    diet controlled  not on meds   . Edema    left leg   . Esophageal varices ([place])   . Fibromyalgia   . Gastritis   . GERD (gastroesophageal reflux disease)   . Hepatic cirrhosis ([place])   . Hepatic encephalopathy ([place])   . History of blood transfusion   . Hyperlipidemia   . Hypertension   . IBS (irritable bowel syndrome)   . Phlebitis    left leg x 2   . Pneumonia    hx of   . Splenomegaly   . Vitamin D deficiency     Past Surgical History:  Procedure Laterality Date  . ABDOMINAL HYSTERECTOMY    . BACK SURGERY    . CATARACT EXTRACTION, BILATERAL    . CHOLECYSTECTOMY    . ESOPHAGOGASTRODUODENOSCOPY  07/16/2012   Procedure: ESOPHAGOGASTRODUODENOSCOPY (EGD);  Surgeon: Inda Castle, MD;  Location: [facility];  Service: Endoscopy;  Laterality: N/A;  . ESOPHAGOGASTRODUODENOSCOPY N/A 01/20/2018   Procedure: ESOPHAGOGASTRODUODENOSCOPY (EGD);  Surgeon: Ladene Artist, MD;  Location[facility]COPY;  Service: Endoscopy;  Laterality: N/A;  . GASTRIC VARICES BANDING  07/16/2012   Procedure: GASTRIC VARICES BANDING;  Surgeon: Inda Castle, MD;  Location: [facility] ENDOSCOPY;  Service: Endoscopy;  Laterality: N/A;  . HIP ARTHROPLASTY Bilateral   . IR GENERIC HISTORICAL  06/25/2016   IR RADIOLOGIST EVAL & MGMT 06/25/2016 [facility]  . IR GENERIC HISTORICAL  06/27/2016   IR KYPHO LUMBAR INC FX REDUCE BONE  BX UNI/BIL CANNULATION INC/IMAGING 06/27/2016 Luanne Bras, MD [facility]  . IR GENERIC HISTORICAL  07/17/2016   IR RADIOLOGIST EVAL & MGMT 07/17/2016 [facility]  . IR PARACENTESIS  01/20/2018  . IR PARACENTESIS  02/27/2018  . IR PARACENTESIS  03/11/2018  . IR PERC TUN PERIT CATH WO PORT S&I Dartha Lodge  03/14/2018  . IR RADIOLOGIST EVAL & MGMT  07/10/2017  . IR RADIOLOGIST EVAL & MGMT  10/01/2017  . IR RADIOLOGIST EVAL & MGMT  12/11/2017  . IR RADIOLOGIST EVAL & MGMT  04/21/2018  . IR THORACENTESIS ASP PLEURAL SPACE W/IMG GUIDE  09/12/2017  . IR THORACENTESIS ASP PLEURAL SPACE W/IMG GUIDE  09/17/2017  . NECK SURGERY    . RADIOFREQUENCY ABLATION N/A 08/30/2017   Procedure: CT MICROWAVE THERMAL ABLATION;  Surgeon: Arne Cleveland, MD;  Location: [facility] ORS;  Service: Anesthesiology;  Laterality: N/A;  . SHOULDER SURGERY Right     Family Hx:  Family History  Problem Relation Age of Onset  . Diabetes Brother        deceased  . Heart disease Sister        A Fib  . Heart disease Mother        CHF  . Atrial fibrillation Sister   . Cancer Father        lung cancer   . Cancer Maternal Uncle        unknown type cancer   . Cancer Other        lung cancer   . Colon cancer Neg Hx     Social History:  reports that she quit smoking about 3 years ago. Her smoking use included cigarettes. She has a 50.00 pack-year smoking history. She has never used smokeless tobacco. She reports that she does not drink alcohol or use drugs.  Allergies:  Allergies  Allergen Reactions  . Atorvastatin Other (See Comments)    Unknown reaction  . Diphenhydramine Hcl (Sleep) Hives  . Hydrocodone-Acetaminophen Other (See Comments)    Unknown reaction  . Lopid [Gemfibrozil] Other (See Comments)    Unknown reaction  . Loratadine Hives  . Lorazepam Hives  . Simvastatin Other (See Comments)    Unknown reaction  . Sulfamethoxazole Hives  . Sulfonamide Derivatives Hives    Medications: Prior to Admission medications    Medication Sig Start Date End Date Taking? Authorizing Provider  albuterol (PROVENTIL HFA;VENTOLIN HFA) 108 (90 Base) MCG/ACT inhaler Inhale 2 puffs into the lungs every 6 (six) hours as needed for wheezing or shortness of breath. 05/27/16  Yes Ward, Delice Bison, DO  allopurinol (ZYLOPRIM) 300 MG tablet take 1 tablet (300 mg) by mouth daily with supper - for gout Patient taking differently: Take 300 mg by mouth daily with supper.  03/04/18  Yes Unk Pinto, MD  ALPRAZolam Duanne Moron) 0.25 MG tablet Take 1 tab as needed for anxiety, cramping, air hunger every 8 hours as needed. Patient taking differently: Take 0.25 mg by mouth every 8 (eight) hours as needed for anxiety.  11/01/17  Yes Liane Comber, NP  Cholecalciferol (VITAMIN D3) 5000 units CAPS Take 5,000 Units by mouth daily with breakfast.   Yes [provider]  cholestyramine light (PREVALITE) 4 GM/DOSE powder Take 1 packet (4 g total) by mouth 2 (two) times daily with a meal. For itching. Patient taking differently: Take 4 g by mouth 2 (two) times daily with a meal.  05/08/18  Yes Liane Comber, NP  ciprofloxacin (CIPRO) 500 MG tablet Take 500 mg by mouth every Monday, Wednesday, and Friday.    Yes [provider]  citalopram (CELEXA) 20 MG tablet Take 1 tablet (20 mg total) by mouth daily. 03/14/18 03/14/19 Yes Florencia Reasons, MD  cyclobenzaprine (FLEXERIL) 10 MG tablet Take 1 tablet (10 mg total) by mouth 3 (three) times daily as needed for muscle spasms. 03/06/16  Yes Unk Pinto, MD  ferrous sulfate 325 (65 FE) MG tablet Take 1 tablet (325 mg total) by mouth daily with breakfast. 03/01/18  Yes Florencia Reasons, MD  gabapentin (NEURONTIN) 600 MG tablet Take 1 tablet (600 mg total) by mouth at bedtime. Patient taking differently: Take 600 mg by mouth 3 (three) times daily.  03/14/18  Yes Florencia Reasons, MD  Glycopyrrolate-Formoterol (BEVESPI AEROSPHERE) 9-4.8 MCG/ACT AERO Inhale 2 puffs into the lungs 2 (two) times daily. 12/25/17  Yes Tanda Rockers, MD  hydrOXYzine (ATARAX/VISTARIL) 10 MG tablet TAKE 1 TABLET BY MOUTH EVERY 4 HOURS AS NEEDED FOR ITCHING Patient taking differently: Take 10 mg by mouth every 4 (four) hours as needed for itching.  05/05/18  Yes Unk Pinto, MD  ipratropium (ATROVENT) 0.02 % nebulizer solution Take 3 mLs by nebulization every 4 (four) hours as needed for wheezing or shortness of breath. 01/29/18  Yes [provider]  lactulose (CHRONULAC) 10 GM/15ML solution take 8ms TWICE DAILY Patient taking differently: Take 20 g by mouth daily.  08/08/17  Yes MUnk Pinto MD  Magnesium 250 MG TABS Take 250 mg by mouth at bedtime.    Yes [provider]  metolazone (ZAROXOLYN) 5 MG tablet TAKE 1 TABLET DAILY OR AS DIRECTED FOR FLUID OVERLOAD Patient taking differently: Take 5 mg by mouth daily. For fluid Overload 04/21/18  Yes CLiane Comber NP  midodrine (PROAMATINE) 5 MG tablet Take 1 tablet 3 x /day with meals Patient taking differently:  Take 5 mg by mouth 3 (three) times daily with meals.  03/31/18  Yes Unk Pinto, MD  ondansetron (ZOFRAN) 4 MG tablet Take 1 tablet (4 mg total) by mouth every 6 (six) hours as needed for nausea or vomiting. 09/02/17  Yes Vicie Mutters, PA-C  OXYGEN Inhale 2-3 L into the lungs continuous. 3 L with exertion   Yes [provider]  pantoprazole (PROTONIX) 40 MG tablet Take 1 tablet (40 mg total) by mouth daily. 03/04/18  Yes Unk Pinto, MD  potassium chloride (K-DUR) 10 MEQ tablet Take 1 tablet (10 mEq total) by mouth every Monday, Wednesday, and Friday. 03/14/18  Yes Florencia Reasons, MD  rifampin (RIFADIN) 300 MG capsule TAKE ONE CAPSULE BY MOUTH TWICE DAILY FOR LIVER Patient taking differently: Take 300 mg by mouth 2 (two) times daily.  05/05/18  Yes Vicie Mutters, PA-C  spironolactone (ALDACTONE) 25 MG tablet TAKE ONE TABLET BY MOUTH TWICE DAILY Patient taking differently: Take 25 mg by mouth 2 (two) times daily.  05/05/18  Yes Unk Pinto, MD  traMADol (ULTRAM) 50 MG tablet TAKE 1 TABLET BY MOUTH EVERY 12 HOURS AS NEEDED FOR PAIN FOR CHRONIC PAIN Patient taking differently: Take 50 mg by mouth every 12 (twelve) hours as needed for severe pain.  04/21/18  Yes Liane Comber, NP  vitamin C (ASCORBIC ACID) 500 MG tablet Take 500 mg by mouth 3 (three) times daily.   Yes [provider]    I have reviewed the patient's current medications.  Labs:  Results for orders placed or performed during the hospital encounter of 05/09/18 (from the past 48 hour(s))  Basic metabolic panel     Status: Abnormal   Collection Time: 05/09/18 10:04 AM  Result Value Ref Range   Sodium 121 (L) 135 - 145 mmol/L   Potassium 7.4 (HH) 3.5 - 5.1 mmol/L    Comment: NO VISIBLE HEMOLYSIS CRITICAL RESULT CALLED TO, READ BACK BY AND VERIFIED WITH: K.MOON,RN 05/09/18 1051 DAVISB    Chloride 97 (L) 98 - 111 mmol/L   CO2 16 (L) 22 - 32 mmol/L   Glucose, Bld 116 (H) 70 - 99 mg/dL   BUN 60 (H) 8 - 23 mg/dL   Creatinine, Ser 2.14 (H) 0.44 - 1.00 mg/dL   Calcium 8.7 (L) 8.9 - 10.3 mg/dL   GFR calc non Af Amer 22 (L) >60 mL/min   GFR calc Af Amer 26 (L) >60 mL/min    Comment: (NOTE) The eGFR has been calculated using the CKD EPI equation. This calculation has not been validated in all clinical situations. eGFR's persistently <60 mL/min signify possible Chronic Kidney Disease.    Anion gap 8 5 - 15    Comment: Performed at [facility] [address] [place], [place] [address]  CBC     Status: Abnormal   Collection Time: 05/09/18 10:04 AM  Result Value Ref Range   WBC 7.1 4.0 - 10.5 K/uL   RBC 2.72 (L) 3.87 - 5.11 MIL/uL   Hemoglobin 8.9 (L) 12.0 - 15.0 g/dL   HCT 29.1 (L) 36.0 - 46.0 %   MCV 107.0 (H) 78.0 - 100.0 fL   MCH 32.7 26.0 - 34.0 pg   MCHC 30.6 30.0 - 36.0 g/dL   RDW 15.9 (H) 11.5 - 15.5 %   Platelets 127 (L) 150 - 400 K/uL    Comment: Performed at [facility], [place] [address] [place], [place] [address]   Basic metabolic panel  Status: Abnormal   Collection Time: 05/09/18  2:41 PM  Result Value Ref Range   Sodium 123 (L) 135 - 145 mmol/L   Potassium 7.0 (HH) 3.5 - 5.1 mmol/L    Comment: NO VISIBLE HEMOLYSIS CRITICAL RESULT CALLED TO, READ BACK BY AND VERIFIED WITH: K PRICE,RN 1624 05/09/18 D BRADLEY    Chloride 98 98 - 111 mmol/L   CO2 16 (L) 22 - 32 mmol/L   Glucose, Bld 126 (H) 70 - 99 mg/dL   BUN 59 (H) 8 - 23 mg/dL   Creatinine, Ser 2.26 (H) 0.44 - 1.00 mg/dL   Calcium 8.9 8.9 - 10.3 mg/dL   GFR calc non Af Amer 21 (L) >60 mL/min   GFR calc Af Amer 24 (L) >60 mL/min    Comment: (NOTE) The eGFR has been calculated using the CKD EPI equation. This calculation has not been validated in all clinical situations. eGFR's persistently <60 mL/min signify possible Chronic Kidney Disease.    Anion gap 9 5 - 15    Comment: Performed at [facility] [address] [place], [place] [address]  Protime-INR     Status: None   Collection Time: 05/09/18  2:41 PM  Result Value Ref Range   Prothrombin Time 14.5 11.4 - 15.2 seconds   INR 1.14     Comment: Performed at [facility] [address] [place], [place] [address]  Ammonia     Status: None   Collection Time: 05/09/18  2:41 PM  Result Value Ref Range   Ammonia 28 9 - 35 umol/L    Comment: Performed at [facility], [place] [address], [place], [place] [address]  Hepatic function panel     Status: Abnormal   Collection Time: 05/09/18  2:41 PM  Result Value Ref Range   Total Protein 4.7 (L) 6.5 - 8.1 g/dL   Albumin 2.5 (L) 3.5 - 5.0 g/dL   AST 43 (H) 15 - 41 U/L   ALT 32 0 - 44 U/L   Alkaline Phosphatase 171 (H) 38 - 126 U/L   Total Bilirubin 1.3 (H) 0.3 - 1.2 mg/dL   Bilirubin, Direct 0.5 (H) 0.0 - 0.2 mg/dL   Indirect Bilirubin 0.8 0.3 - 0.9 mg/dL    Comment: Performed at [facility] [address] [place],  [address]     ROS:  Pertinent items are noted in HPI.  Physical Exam: Vitals:    05/09/18 1345 05/09/18 1446  BP: (!) 128/42 (!) 126/45  Pulse: 65 62  Resp: 15 (!) 24  Temp:  98.7 F (37.1 C)  SpO2: 100% 94%     General: chronically ill appearing HEENT: MM dry Eyes: anicteric Neck: no JVD Heart: RRR, II/VI sys murmur Lungs: prolonged exp phase Abdomen: distended but not tense, nontender Extremities: trace edema, chronic venous stasis changes Neuro: alert and oriented  Assessment/Plan: 1.  Hyperkalemia:  Severe, symptomatic with muscle weakness.  Multifactorial with CKD, KCl supplements in h/o hypokalemia, hypovolemia.  Should be able to medically manage this -- it's improved some from 7.8 yesterday to 7.0 on most recent check.  Hold Kcl supplements. Will redose veltassa 8.1g now and again in 8hrs.  She rec'd 1.5L NS boluses and will give another 0.5L bolus now to augment potassium excretion through kidneys.  She has peritoneal drain already so any ascites secondary to IVF can be dealt with easily.  Dialysis was mentioned in the ED but I do not think it  will be indicated.   2.  AKI on CKD:  Cr 1.2-1.4 recently, now low 2s.  She appears dry and it seems like her ascites has been less voluminous lately.  Seems that she is a bit hypovolemic intravascularly.  IVF as above.   She likely has hepatorenal physiology as well so it may not improve entirely.  She is not a candidate for long term dialysis given her comorbidities and frail status.   3.  Hyponatremia, hypervolemic: chronic secondary to cirrhosis.  CTM.      Jannifer Hick A 05/09/2018, 4:32 PM

## 2018-05-09 NOTE — Progress Notes (Signed)
Lab called with Critical Value.  Potassium is 6.6, down from 7.0.  Pt just given kaexolate.

## 2018-05-09 NOTE — Progress Notes (Signed)
CRITICAL VALUE ALERT  Critical Value: Potassium &  Date & Time Notied: 05/09/18 1622  Provider Notified: Dr. Olevia Bowens  Orders Received/Actions taken: To be entered by provider

## 2018-05-10 LAB — COMPREHENSIVE METABOLIC PANEL
ALBUMIN: 2.1 g/dL — AB (ref 3.5–5.0)
ALK PHOS: 137 U/L — AB (ref 38–126)
ALT: 28 U/L (ref 0–44)
AST: 31 U/L (ref 15–41)
Anion gap: 8 (ref 5–15)
BUN: 59 mg/dL — ABNORMAL HIGH (ref 8–23)
CALCIUM: 8.9 mg/dL (ref 8.9–10.3)
CO2: 17 mmol/L — AB (ref 22–32)
CREATININE: 2.18 mg/dL — AB (ref 0.44–1.00)
Chloride: 99 mmol/L (ref 98–111)
GFR calc non Af Amer: 22 mL/min — ABNORMAL LOW (ref >60)
GFR, EST AFRICAN AMERICAN: 25 mL/min — AB (ref >60)
Glucose, Bld: 93 mg/dL (ref 70–99)
Potassium: 6.1 mmol/L — ABNORMAL HIGH (ref 3.5–5.1)
SODIUM: 124 mmol/L — AB (ref 135–145)
Total Bilirubin: 1.1 mg/dL (ref 0.3–1.2)
Total Protein: 4 g/dL — ABNORMAL LOW (ref 6.5–8.1)

## 2018-05-10 LAB — CBC
HCT: 24.2 % — ABNORMAL LOW (ref 36.0–46.0)
HEMOGLOBIN: 7.9 g/dL — AB (ref 12.0–15.0)
MCH: 33.9 pg (ref 26.0–34.0)
MCHC: 32.6 g/dL (ref 30.0–36.0)
MCV: 103.9 fL — ABNORMAL HIGH (ref 78.0–100.0)
Platelets: 105 10*3/uL — ABNORMAL LOW (ref 150–400)
RBC: 2.33 MIL/uL — AB (ref 3.87–5.11)
RDW: 15.6 % — ABNORMAL HIGH (ref 11.5–15.5)
WBC: 5.2 10*3/uL (ref 4.0–10.5)

## 2018-05-10 MED ORDER — SODIUM CHLORIDE 0.9 % IV SOLN
INTRAVENOUS | Status: DC
  Administered 2018-05-10 (×2): via INTRAVENOUS

## 2018-05-10 MED ORDER — SODIUM POLYSTYRENE SULFONATE PO POWD
30.0000 g | Freq: Once | ORAL | Status: AC
  Administered 2018-05-10: 30 g via ORAL
  Filled 2018-05-10: qty 30

## 2018-05-10 MED ORDER — SODIUM POLYSTYRENE SULFONATE 15 GM/60ML PO SUSP
30.0000 g | Freq: Once | ORAL | Status: DC
  Filled 2018-05-10: qty 120

## 2018-05-10 MED ORDER — SODIUM CHLORIDE 0.9 % IV SOLN: INTRAVENOUS | Status: DC

## 2018-05-10 NOTE — Progress Notes (Signed)
[facility] GIP RN visit at [phone]  This is a related and covered GIP admission on 05/09/2018 with [facility] diagnosis of Cancer per Dr. Karie Georges. Patient has an [place] DNR. Patient had a visit with her provider on 10/3 and had lab work completed. The next day the [facility] SN contacted the pt for a HV however caregiver informed the SN that he was at the [facility]. Primary caregiver informed the [facility] that he received a call that morning that the patient's Potassium level was elevated and he needed to take the patient to the ED for further evaluation. Pt was admitted to the hospital with potassium greater then 7, generalized weakness, 3 days of difficulty walking indicating "legs feeling weak", some chronic loose stools and on & off nausea but no vomiting. Also found to have intermittent bradycardia and irregular HR. Pt was given an albuterol 10 mg neb treatment, a gram of calcium gluconate, 50 mL's of D50, 5 units of insulin IV and a packet of Velt[place] in the ED  Day 1 of [facility] GIP.  Pt admitted to the progressive care unit for treatment of hyperkalemia. Repeat lab work indicated potassium levels have decreased from 7.4 to 6.1 today. Pt's only mobility is from bed to bedside commode due to ongoing Kayexalate. Pt indicates she has been eating (verified by bedside nurse). Patient will be re-evaluated again on 05/11/2018. Patient indicates no pain or issues encountered at this time. Bedside nurse indicates pt has not needed any pain or PRN medications at this time.    Hospice liaison met with patient at bedside today. Pt again denied any complains of pain and indicates she is tolerate her meals. No family or visitors at bedside at the time of this visit however bedside nurse indicated husband visited most of the day on yesterday.   Per MD notes: 1.  Severe dehydration, acute renal insufficiency along with severe life-threatening hyperkalemia in a patient who was on the home hospice,  bedbound and DNR.  This most certainly was because due to combination of Zaroxolyn, lactulose and Aldactone.  She has been adequately hydrated, hyperkalemia protocol has been followed.  Potassium is down to reasonable levels.  Continue hydration for 24 hours repeat another dose of Kayexalate.  Repeat labs tomorrow once stable discharge back with home hospice.  In the future will refrain from checking routine labs and hospitalization considering that she is hospice and goal of care reasonably should be comfort only. 2.  ARF.  Plan is #1 above.  Hold offending medications. 3.  Chronic diastolic CHF EF 85% on recent echocardiogram.  Dehydrated, hydrate and monitor. 4.  Chronic hypotension.  On midodrine. 5.  Cirrhosis - NASH, history of hepatic encephalopathy, portal hypertension.  Continue lactulose as needed, also on rifampin for SBP prophylaxis.  Follows with Dr. Arelia Longest 6. Peritoneal Catheter - chronic due to #5 above, drains about a liter to liter and a half and a day. 7.  Thrombocytopenia.  Again chronic due to #5 above. 8. H/O [facility] - status post ambulation in January 2019.  Outpatient follow-up as appropriate.  Medications: In addition scheduled medications: NS @ 153m/hr, no PRN meds have been given.   [facility] Transfer summary and medication list placed on the shallow chart. Plan of care was discussed with [facility] RN and SW.  Goals of care: symptom management with no aggressive therapies, she is DNR/DNI.  Communication with [facility]: no family present but patient is alert and oriented.  Comminution  with IDG: Team updated of patient disposition.   Hospice will continue to follow while in-patient. Plan would be for pt to return home with Hospice upon discharge.  Please call [facility] [phone] for transportation at time of discharge as [facility] contracts with this services for Hospice patients.   Thank You.  Raina Mina, RN, BSN [facility] Liaison [place] are on  AMION

## 2018-05-10 NOTE — Progress Notes (Signed)
One liter of yellow fluid drained from peritoneal catheter with pleurx drain.

## 2018-05-10 NOTE — Progress Notes (Signed)
[facility] Progress Note   Subjective:   Weakness improved. Husband at bedside.   Objective Vitals:   05/09/18 1345 05/09/18 1446 05/09/18 2131 05/10/18 0429  BP: (!) 128/42 (!) 126/45 (!) 137/30 (!) 128/54  Pulse: 65 62 61 87  Resp: 15 (!) 24 18 18   Temp:  98.7 F (37.1 C) 97.8 F (36.6 C) 98.2 F (36.8 C)  TempSrc:  Oral    SpO2: 100% 94% 100% 99%  Weight:  75.9 kg    Height:  5' 2"  (1.575 m)     Physical Exam General: chronically ill appearing Heart: RRR Lungs:normal WOB Abdomen: soft, mod distended Extremities: no edema  Additional Objective Labs: Basic Metabolic Panel: Recent Labs  Lab 05/09/18 1441 05/09/18 1949 05/10/18 0400  NA 123* 123* 124*  K 7.0* 6.6* 6.1*  CL 98 99 99  CO2 16* 18* 17*  GLUCOSE 126* 147* 93  BUN 59* 57* 59*  CREATININE 2.26* 2.13* 2.18*  CALCIUM 8.9 9.4 8.9   Liver Function Tests: Recent Labs  Lab 05/08/18 1135 05/09/18 1441 05/10/18 0400  AST 37* 43* 31  ALT 26 32 28  ALKPHOS  --  171* 137*  BILITOT 0.8 1.3* 1.1  PROT 4.6* 4.7* 4.0*  ALBUMIN  --  2.5* 2.1*   No results for input(s): LIPASE, AMYLASE in the last 168 hours. CBC: Recent Labs  Lab 05/08/18 1135 05/09/18 1004 05/10/18 0400  WBC 7.7 7.1 5.2  NEUTROABS 5,898  --   --   HGB 9.5* 8.9* 7.9*  HCT 28.3* 29.1* 24.2*  MCV 97.9 107.0* 103.9*  PLT 147 127* 105*   Blood Culture    Component Value Date/Time   SDES FLUID PERITONEAL 03/11/2018 1222   SDES FLUID PERITONEAL 03/11/2018 1222   SPECREQUEST NONE 03/11/2018 1222   SPECREQUEST NONE 03/11/2018 1222   CULT  03/11/2018 1222    NO GROWTH 5 DAYS Performed at [facility], [place] [address], [place], [place] [address]    REPTSTATUS 03/11/2018 FINAL 03/11/2018 1222   REPTSTATUS 03/16/2018 FINAL 03/11/2018 1222    Cardiac Enzymes: No results for input(s): CKTOTAL, CKMB, CKMBINDEX, TROPONINI in the last 168 hours. CBG: No results for input(s): GLUCAP in the last 168 hours. Iron  Studies: No results for input(s): IRON, TIBC, TRANSFERRIN, FERRITIN in the last 72 hours. @lablastinr3 @ Studies/Results: Dg Chest Port 1 View  Result Date: 05/09/2018 CLINICAL DATA:  Hyperkalemia.  History of cirrhosis, COPD. EXAM: PORTABLE CHEST 1 VIEW COMPARISON:  Chest radiograph March 22, 2018 FINDINGS: Cardiac silhouette is upper limits of normal size, possibly overestimated by AP technique. Calcified aortic arch. RIGHT middle lobe patchy airspace opacity. No pleural effusion. Mild chronic interstitial changes. Scattered granulomas. No pneumothorax. ACDF and facet screws. RIGHT shoulder arthroplasty. Cortical defect along inferior margin proximal RIGHT humerus. Old RIGHT rib fractures. IMPRESSION: 1. RIGHT middle lobe atelectasis or pneumonia. 2. Cortical offset proximal humerus, possible fracture. Recommend correlation with point tenderness and consider dedicated RIGHT shoulder radiograph. 3. Borderline cardiomegaly. 4.  Aortic Atherosclerosis (ICD10-I70.0). Emphysema (ICD10-J43.9). Electronically Signed   By: Elon Alas M.D.   On: 05/09/2018 13:37   Medications: . sodium chloride    . sodium chloride    . sodium chloride     . allopurinol  300 mg Oral Q supper  . cholecalciferol  5,000 Units Oral Q breakfast  . cholestyramine light  4 g Oral BID WC  . ciprofloxacin  500 mg Oral Q M,W,F  . citalopram  20 mg Oral  Daily  . ferrous sulfate  325 mg Oral Q breakfast  . gabapentin  600 mg Oral TID  . lactulose  20 g Oral Daily  . magnesium oxide  400 mg Oral Daily  . metolazone  5 mg Oral Daily  . midodrine  5 mg Oral TID WC  . pantoprazole  40 mg Oral Daily  . rifampin  300 mg Oral BID  . sodium polystyrene  30 g Oral Once    Assessment/Plan: 1.  Severe hyperkalemia:  > 7, associated muscle weakness. Multifactorial with volume depletion (inc metolazone), spironolactone, KCl supplement, AKI.  Resolving with IVF and holding offending medications.   2.  AoCKD:  Cr up in setting  of volume depletion, improved marginally compared to admission.  In light of goals of care and comorbities no additional modification or therapy is indicated.  She is not a candidate for dialysis for these reasons as well, though it would not be indicated at this time anyway.   Will sign off.  Agree with hospitalist management and goals of care discussion.  Jannifer Hick MD [facility] Pager [phone]

## 2018-05-10 NOTE — Progress Notes (Signed)
@IPLOG @        PROGRESS NOTE                                                                                                                                                                                                             Patient Demographics:    [name], is a 72 y.o. female, DOB - 1946-04-16, NTZ:[id number]  Admit date - 05/09/2018   Admitting Physician Reubin Milan, MD  Outpatient Primary MD for the patient is Unk Pinto, MD  LOS - 1  Chief Complaint  Patient presents with  . Abnormal Lab       Brief Narrative  [name] is a 72 y.o. female with medical history significant of asthma/COPD, liver cirrhosis, esophageal varices, hepatocellular carcinoma, history of hepatic encephalopathy, splenomegaly, GERD, depression, type 2 diabetes, hyperlipidemia, hypertension, IBS, history of left leg phlebitis, history of pneumonia, vitamin D deficiency who is sent to the emergency department by his PCP office after she had lab work drawn yesterday which showed a critical high potassium level and dehydration, she is under home hospice.   Subjective:    [name] today has, No headache, No chest pain, No abdominal pain - No Nausea, No new weakness tingling or numbness, No Cough - SOB.  Feels weak.   Assessment  & Plan :     1.  Severe dehydration, acute renal insufficiency along with severe life-threatening hyperkalemia in a patient who was on the home hospice, bedbound and DNR.  This most certainly was because due to combination of Zaroxolyn, lactulose and Aldactone.  She has been adequately hydrated, hyperkalemia protocol has been followed.  Potassium is down to reasonable levels.  Continue hydration for 24 hours repeat another dose of Kayexalate.  Repeat labs tomorrow once stable discharge back with home hospice.  In the future will refrain from checking routine labs and hospitalization considering that she is hospice and goal of care reasonably should be  comfort only.  2.  ARF.  Plan is #1 above.  Hold offending medications.  3.  Chronic diastolic CHF EF 67% on recent echocardiogram.  Dehydrated, hydrate and monitor.  4.  Chronic hypotension.  On midodrine.  5.  Cirrhosis - NASH, history of hepatic encephalopathy, portal hypertension.  Continue lactulose as needed, also on rifampin for SBP prophylaxis.  Follows with Dr. Arelia Longest.  6. Peritoneal Catheter - chronic due to #5 above, drains about a liter to liter and a half and a day.  7.  Thrombocytopenia.  Again chronic due to #  5 above.  8. H/O [facility] - status post ambulation in January 2019.  Outpatient follow-up as appropriate.    Family Communication  :  None  Code Status :  DNR  Disposition Plan  :  Home in am  Consults  :  Renal  Procedures  :      DVT Prophylaxis  :   SCDs    Lab Results  Component Value Date   PLT 105 (L) 05/10/2018    Diet :  Diet Order            Diet Heart Room service appropriate? Yes; Fluid consistency: Thin  Diet effective now               Inpatient Medications Scheduled Meds: . allopurinol  300 mg Oral Q supper  . cholecalciferol  5,000 Units Oral Q breakfast  . cholestyramine light  4 g Oral BID WC  . ciprofloxacin  500 mg Oral Q M,W,F  . citalopram  20 mg Oral Daily  . ferrous sulfate  325 mg Oral Q breakfast  . gabapentin  600 mg Oral TID  . lactulose  20 g Oral Daily  . magnesium oxide  400 mg Oral Daily  . metolazone  5 mg Oral Daily  . midodrine  5 mg Oral TID WC  . pantoprazole  40 mg Oral Daily  . rifampin  300 mg Oral BID  . sodium polystyrene  30 g Oral Once   Continuous Infusions: . sodium chloride    . sodium chloride    . sodium chloride     PRN Meds:.ALPRAZolam, cyclobenzaprine, hydrOXYzine, ipratropium, ondansetron **OR** ondansetron (ZOFRAN) IV, traMADol  Antibiotics  :   Anti-infectives (From admission, onward)   Start     Dose/Rate Route Frequency Ordered Stop   05/09/18 2200  rifampin (RIFADIN)  capsule 300 mg  Status:  Discontinued     300 mg Oral 2 times daily 05/09/18 1647 05/09/18 1801   05/09/18 2030  ciprofloxacin (CIPRO) tablet 500 mg  Status:  Discontinued     500 mg Oral Every M-W-F 05/09/18 1755 05/09/18 1757   05/09/18 2030  ciprofloxacin (CIPRO) tablet 500 mg     500 mg Oral Every M-W-F 05/09/18 1757     05/09/18 2030  rifampin (RIFADIN) capsule 300 mg     300 mg Oral 2 times daily 05/09/18 1801     05/09/18 1700  ciprofloxacin (CIPRO) tablet 500 mg  Status:  Discontinued     500 mg Oral Every M-W-F 05/09/18 1647 05/09/18 1755          Objective:   Vitals:   05/09/18 1345 05/09/18 1446 05/09/18 2131 05/10/18 0429  BP: (!) 128/42 (!) 126/45 (!) 137/30 (!) 128/54  Pulse: 65 62 61 87  Resp: 15 (!) 24 18 18   Temp:  98.7 F (37.1 C) 97.8 F (36.6 C) 98.2 F (36.8 C)  TempSrc:  Oral    SpO2: 100% 94% 100% 99%  Weight:  75.9 kg    Height:  5' 2"  (1.575 m)      Wt Readings from Last 3 Encounters:  05/09/18 75.9 kg  04/08/18 83.9 kg  03/28/18 87.1 kg     Intake/Output Summary (Last 24 hours) at 05/10/2018 1111 Last data filed at 05/10/2018 0134 Gross per 24 hour  Intake -  Output 3 ml  Net -3 ml     Physical Exam  Awake Alert,  No new F.N deficits, flat affect Johnson.AT,PERRAL Supple Neck,No JVD, No  cervical lymphadenopathy appriciated.  Symmetrical Chest wall movement, Good air movement bilaterally, CTAB RRR,No Gallops,Rubs or new Murmurs, No Parasternal Heave +ve B.Sounds, Abd Soft, No tenderness, No organomegaly appriciated, No rebound - guarding or rigidity. No Cyanosis, Clubbing or edema, No new Rash or bruise      Data Review:    CBC Recent Labs  Lab 05/08/18 1135 05/09/18 1004 05/10/18 0400  WBC 7.7 7.1 5.2  HGB 9.5* 8.9* 7.9*  HCT 28.3* 29.1* 24.2*  PLT 147 127* 105*  MCV 97.9 107.0* 103.9*  MCH 32.9 32.7 33.9  MCHC 33.6 30.6 32.6  RDW 14.6 15.9* 15.6*  LYMPHSABS 824*  --   --   EOSABS 308  --   --   BASOSABS 62  --   --      Chemistries  Recent Labs  Lab 05/08/18 1135 05/09/18 1004 05/09/18 1441 05/09/18 1949 05/10/18 0400  NA 119* 121* 123* 123* 124*  K 7.8* 7.4* 7.0* 6.6* 6.1*  CL 96* 97* 98 99 99  CO2 17* 16* 16* 18* 17*  GLUCOSE 96 116* 126* 147* 93  BUN 62* 60* 59* 57* 59*  CREATININE 1.89* 2.14* 2.26* 2.13* 2.18*  CALCIUM 8.8 8.7* 8.9 9.4 8.9  AST 37*  --  43*  --  31  ALT 26  --  32  --  28  ALKPHOS  --   --  171*  --  137*  BILITOT 0.8  --  1.3*  --  1.1   ------------------------------------------------------------------------------------------------------------------ No results for input(s): CHOL, HDL, LDLCALC, TRIG, CHOLHDL, LDLDIRECT in the last 72 hours.  Lab Results  Component Value Date   HGBA1C 5.1 09/03/2017   ------------------------------------------------------------------------------------------------------------------ No results for input(s): TSH, T4TOTAL, T3FREE, THYROIDAB in the last 72 hours.  Invalid input(s): FREET3 ------------------------------------------------------------------------------------------------------------------ No results for input(s): VITAMINB12, FOLATE, FERRITIN, TIBC, IRON, RETICCTPCT in the last 72 hours.  Coagulation profile Recent Labs  Lab 05/09/18 1441  INR 1.14    No results for input(s): DDIMER in the last 72 hours.  Cardiac Enzymes No results for input(s): CKMB, TROPONINI, MYOGLOBIN in the last 168 hours.  Invalid input(s): CK ------------------------------------------------------------------------------------------------------------------    Component Value Date/Time   BNP 283.7 (H) 02/25/2018 1538   BNP 43.2 12/17/2014 1204    Micro Results No results found for this or any previous visit (from the past 240 hour(s)).  Radiology Reports  Dg Chest Port 1 View  Result Date: 05/09/2018 CLINICAL DATA:  Hyperkalemia.  History of cirrhosis, COPD. EXAM: PORTABLE CHEST 1 VIEW COMPARISON:  Chest radiograph March 22, 2018  FINDINGS: Cardiac silhouette is upper limits of normal size, possibly overestimated by AP technique. Calcified aortic arch. RIGHT middle lobe patchy airspace opacity. No pleural effusion. Mild chronic interstitial changes. Scattered granulomas. No pneumothorax. ACDF and facet screws. RIGHT shoulder arthroplasty. Cortical defect along inferior margin proximal RIGHT humerus. Old RIGHT rib fractures. IMPRESSION: 1. RIGHT middle lobe atelectasis or pneumonia. 2. Cortical offset proximal humerus, possible fracture. Recommend correlation with point tenderness and consider dedicated RIGHT shoulder radiograph. 3. Borderline cardiomegaly. 4.  Aortic Atherosclerosis (ICD10-I70.0). Emphysema (ICD10-J43.9). Electronically Signed   By: Elon Alas M.D.   On: 05/09/2018 13:37     Time Spent in minutes  30   Lala Lund M.D on 05/10/2018 at 11:11 AM  To page go to [email or web address] - password [email or web address]

## 2018-05-11 LAB — BASIC METABOLIC PANEL
ANION GAP: 8 (ref 5–15)
BUN: 54 mg/dL — ABNORMAL HIGH (ref 8–23)
CO2: 17 mmol/L — ABNORMAL LOW (ref 22–32)
Calcium: 7.9 mg/dL — ABNORMAL LOW (ref 8.9–10.3)
Chloride: 101 mmol/L (ref 98–111)
Creatinine, Ser: 1.94 mg/dL — ABNORMAL HIGH (ref 0.44–1.00)
GFR, EST AFRICAN AMERICAN: 29 mL/min — AB (ref >60)
GFR, EST NON AFRICAN AMERICAN: 25 mL/min — AB (ref >60)
Glucose, Bld: 117 mg/dL — ABNORMAL HIGH (ref 70–99)
Potassium: 4 mmol/L (ref 3.5–5.1)
Sodium: 126 mmol/L — ABNORMAL LOW (ref 135–145)

## 2018-05-11 LAB — MAGNESIUM: MAGNESIUM: 2.5 mg/dL — AB (ref 1.7–2.4)

## 2018-05-11 MED ORDER — FUROSEMIDE 40 MG PO TABS: 40.0000 mg | ORAL_TABLET | Freq: Two times a day (BID) | ORAL | 0 refills | Status: AC

## 2018-05-11 MED ORDER — SODIUM CHLORIDE 0.9 % IV BOLUS
1000.0000 mL | Freq: Once | INTRAVENOUS | Status: AC
  Administered 2018-05-11: 1000 mL via INTRAVENOUS

## 2018-05-11 MED ORDER — POTASSIUM CHLORIDE ER 10 MEQ PO TBCR: 10.0000 meq | EXTENDED_RELEASE_TABLET | Freq: Every day | ORAL | 0 refills | Status: AC

## 2018-05-11 NOTE — Progress Notes (Signed)
1 liter yellow fluid drained from Pleurex catheter without any difficulty following sterile technique, dressing to catheter changed.  P.J. Linus Mako, RN

## 2018-05-11 NOTE — Discharge Summary (Signed)
[name] BXU:[id number] DOB: 19-Dec-1945 DOA: 05/09/2018  PCP: Unk Pinto, MD  Admit date: 05/09/2018  Discharge date: 05/11/2018  Admitted From: Home with Hospice   Disposition:  Home with Hospice   Recommendations for Outpatient Follow-up:   Follow up with PCP in 1-2 weeks  PCP Please obtain BMP/CBC, 2 view CXR in 1week,  (see Discharge instructions)   PCP Please follow up on the following pending results: Routine lab draws are recommended this patient is hospice with goal of care being comfort.   Home Health: Hospice  Equipment/Devices: None  Consultations: Renal Discharge Condition: Guarded   CODE STATUS: DNR   Diet Recommendation: Heart Healthy with 1.5 L/day total fluid restriction  Chief Complaint  Patient presents with  . Abnormal Lab     Brief history of present illness from the day of admission and additional interim summary    Kerry Heslin Wyrickis a 72 y.o.femalewith medical history significant of asthma/COPD, liver cirrhosis, esophageal varices, hepatocellular carcinoma, history of hepatic encephalopathy, splenomegaly, GERD, depression, type 2 diabetes, hyperlipidemia, hypertension, IBS, history of left leg phlebitis, history of pneumonia, vitamin D deficiency who is sent to the emergency department by his PCP office after she had lab work drawn yesterday which showed a critical high potassium level and dehydration, she is under home hospice.                                                                 Hospital Course    1.  Severe dehydration, acute renal insufficiency along with severe life-threatening hyperkalemia in a patient who was on the home hospice, bedbound and DNR.  This most certainly was because due to combination of Zaroxolyn, potassium supplementation, lactulose and  Aldactone. She was hydrated, hyperkalemia protocol has been followed.  Potassium is able now and her blood pressure has stabilized as well, she is symptom-free, she will get another IV fluid bolus prior to discharge, home diuretics have been adjusted, at this point patient will be discharged home, continue following with hospice.  Do not think routine lab draws are warranted with her being at home hospice and goal of care being comfort.  2.  ARF.  Plan is #1 above.  Renal function improving close to baseline.  3.  Chronic diastolic CHF EF 19% on recent echocardiogram.  Severely dehydrated, now close to baseline, another fluid bolus, diuretic dose and dropped upon discharge discharge home, again goal of care is comfort and she is at home with hospice.  4.  Chronic hypotension.  On midodrine.  5.  Cirrhosis - NASH, history of hepatic encephalopathy, portal hypertension.  Continue lactulose as needed, also on rifampin for SBP prophylaxis.  Follows with Dr. Arelia Longest.  6. Peritoneal Catheter - chronic due to #5 above, drains about a liter to liter and a  half and a day.  7.  Thrombocytopenia.  Again chronic due to #5 above.  8. H/O [facility] - status post ambulation in January 2019.  Outpatient follow-up as appropriate.   Discharge diagnosis     Principal Problem:   Hyperkalemia Active Problems:   Hyperlipidemia   GERD   Thrombocytopenia (HCC)   COPD GOLD IV/ 02 dep    Acute renal failure superimposed on stage 3 chronic kidney disease (HCC)   Hyponatremia   Chronic diastolic CHF (congestive heart failure) (HCC)   Iron deficiency anemia due to chronic blood loss    Discharge instructions    Discharge Instructions    Discharge instructions   Complete by:  As directed    Disposition.  Home with hospice.  Goal of care is comfort. CODE STATUS.  DNR Follow-up.  Hospice nurse. Diet.  Heart healthy diet with 1.5 L/day total fluid restriction.  With feeding assistance and aspiration  precautions. Activity.  With assistance and fall precautions.   Increase activity slowly   Complete by:  As directed       Discharge Medications   Allergies as of 05/11/2018      Reactions   Atorvastatin Other (See Comments)   Unknown reaction   Diphenhydramine Hcl (sleep) Hives   Hydrocodone-acetaminophen Other (See Comments)   Unknown reaction   Lopid [gemfibrozil] Other (See Comments)   Unknown reaction   Loratadine Hives   Lorazepam Hives   Simvastatin Other (See Comments)   Unknown reaction   Sulfamethoxazole Hives   Sulfonamide Derivatives Hives      Medication List    STOP taking these medications   metolazone 5 MG tablet Commonly known as:  ZAROXOLYN   spironolactone 25 MG tablet Commonly known as:  ALDACTONE     TAKE these medications   albuterol 108 (90 Base) MCG/ACT inhaler Commonly known as:  PROVENTIL HFA;VENTOLIN HFA Inhale 2 puffs into the lungs every 6 (six) hours as needed for wheezing or shortness of breath.   allopurinol 300 MG tablet Commonly known as:  ZYLOPRIM take 1 tablet (300 mg) by mouth daily with supper - for gout What changed:    how much to take  how to take this  when to take this  additional instructions   ALPRAZolam 0.25 MG tablet Commonly known as:  XANAX Take 1 tab as needed for anxiety, cramping, air hunger every 8 hours as needed. What changed:  See the new instructions.   cholestyramine light 4 GM/DOSE powder Commonly known as:  PREVALITE Take 1 packet (4 g total) by mouth 2 (two) times daily with a meal. For itching. What changed:  additional instructions   ciprofloxacin 500 MG tablet Commonly known as:  CIPRO Take 500 mg by mouth every Monday, Wednesday, and Friday.   citalopram 20 MG tablet Commonly known as:  CELEXA Take 1 tablet (20 mg total) by mouth daily.   cyclobenzaprine 10 MG tablet Commonly known as:  FLEXERIL Take 1 tablet (10 mg total) by mouth 3 (three) times daily as needed for muscle  spasms.   ferrous sulfate 325 (65 FE) MG tablet Take 1 tablet (325 mg total) by mouth daily with breakfast.   furosemide 40 MG tablet Commonly known as:  LASIX Take 1 tablet (40 mg total) by mouth 2 (two) times daily.   gabapentin 600 MG tablet Commonly known as:  NEURONTIN Take 1 tablet (600 mg total) by mouth at bedtime. What changed:  when to take this   Glycopyrrolate-Formoterol 9-4.8 MCG/ACT  Aero Inhale 2 puffs into the lungs 2 (two) times daily.   hydrOXYzine 10 MG tablet Commonly known as:  ATARAX/VISTARIL TAKE 1 TABLET BY MOUTH EVERY 4 HOURS AS NEEDED FOR ITCHING What changed:  See the new instructions.   ipratropium 0.02 % nebulizer solution Commonly known as:  ATROVENT Take 3 mLs by nebulization every 4 (four) hours as needed for wheezing or shortness of breath.   lactulose 10 GM/15ML solution Commonly known as:  CHRONULAC take 2ms TWICE DAILY What changed:    how much to take  how to take this  when to take this  additional instructions   Magnesium 250 MG Tabs Take 250 mg by mouth at bedtime.   midodrine 5 MG tablet Commonly known as:  PROAMATINE Take 1 tablet 3 x /day with meals What changed:    how much to take  how to take this  when to take this  additional instructions   ondansetron 4 MG tablet Commonly known as:  ZOFRAN Take 1 tablet (4 mg total) by mouth every 6 (six) hours as needed for nausea or vomiting.   OXYGEN Inhale 2-3 L into the lungs continuous. 3 L with exertion   pantoprazole 40 MG tablet Commonly known as:  PROTONIX Take 1 tablet (40 mg total) by mouth daily.   potassium chloride 10 MEQ tablet Commonly known as:  K-DUR Take 1 tablet (10 mEq total) by mouth daily. What changed:  when to take this   rifampin 300 MG capsule Commonly known as:  RIFADIN TAKE ONE CAPSULE BY MOUTH TWICE DAILY FOR LIVER What changed:  See the new instructions.   traMADol 50 MG tablet Commonly known as:  ULTRAM TAKE 1 TABLET BY  MOUTH EVERY 12 HOURS AS NEEDED FOR PAIN FOR CHRONIC PAIN What changed:    how much to take  how to take this  when to take this  reasons to take this  additional instructions   vitamin C 500 MG tablet Commonly known as:  ASCORBIC ACID Take 500 mg by mouth 3 (three) times daily.   Vitamin D3 5000 units Caps Take 5,000 Units by mouth daily with breakfast.         Major procedures and Radiology Reports - PLEASE review detailed and final reports thoroughly  -         Dg Chest Port 1 View  Result Date: 05/09/2018 CLINICAL DATA:  Hyperkalemia.  History of cirrhosis, COPD. EXAM: PORTABLE CHEST 1 VIEW COMPARISON:  Chest radiograph March 22, 2018 FINDINGS: Cardiac silhouette is upper limits of normal size, possibly overestimated by AP technique. Calcified aortic arch. RIGHT middle lobe patchy airspace opacity. No pleural effusion. Mild chronic interstitial changes. Scattered granulomas. No pneumothorax. ACDF and facet screws. RIGHT shoulder arthroplasty. Cortical defect along inferior margin proximal RIGHT humerus. Old RIGHT rib fractures. IMPRESSION: 1. RIGHT middle lobe atelectasis or pneumonia. 2. Cortical offset proximal humerus, possible fracture. Recommend correlation with point tenderness and consider dedicated RIGHT shoulder radiograph. 3. Borderline cardiomegaly. 4.  Aortic Atherosclerosis (ICD10-I70.0). Emphysema (ICD10-J43.9). Electronically Signed   By: CElon AlasM.D.   On: 05/09/2018 13:37   Ir Radiologist Eval & Mgmt  Result Date: 04/21/2018 INDICATION: Patient with history of recurrent ascites secondary to liver cirrhosis s/p tunneled peritoneal drainage catheter placement in IR on 03/14/18 by Dr. YKathlene Cote Presents today with concerns for infection of catheter as well as some leaking around the insertion site. Patient and husband report redness and pain at tube insertion site as well as  concern for fluid leaking from site. Patient here today for possible drain  exchange. The drain is functioning well. Patient was examined by Dr. Anselm Pancoast today who recommends removal and replacement of retention suture without drain exchange. EXAM: IR EVAL AND MANAGEMENT COMPARISON:  None. MEDICATIONS: 2 mL 2% lidocaine CONTRAST:  None FLUOROSCOPY TIME:  None COMPLICATIONS: None immediate TECHNIQUE: Informed written consent was obtained from the patient after a discussion of the risks, benefits and alternatives to treatment. Questions regarding the procedure were encouraged and answered. A timeout was performed prior to the initiation of the procedure. The right lower abdomen was prepped and draped in the usual sterile fashion, and a sterile drape was applied covering the operative field. Maximum barrier sterile technique with mask and sterile gloves were used for the procedure. A timeout was performed prior to the initiation of the procedure. Local anesthesia was provided with 2% lidocaine. Existing Prolene retention suture removed without issue and one new Prolene retention suture was placed. Patient tolerated procedure well. Sterile gauze covered by Tegaderm was applied following procedure. FINDINGS: Suture granuloma at insertion site of tunneled peritoneal catheter. IMPRESSION: Successful removal of existing Prolene retention suture and replacement with new Prolene retention suture at insertion site of tunneled peritoneal catheter. Minimal redness around drain site and patient is currently on antibiotics. No discharge or drainage around the drain today. No need to remove drain at this time. Read by Candiss Norse, PA-C Electronically Signed   By: Markus Daft M.D.   On: 04/21/2018 12:41    Micro Results     No results found for this or any previous visit (from the past 240 hour(s)).  Today   Subjective    [name] today has no headache,no chest abdominal pain,no new weakness tingling or numbness, feels much better wants to go home today.     Objective   Blood  pressure (!) 132/48, pulse 93, temperature 98.1 F (36.7 C), temperature source Oral, resp. rate 20, height 5' 2"  (1.575 m), weight 75.9 kg, SpO2 100 %.   Intake/Output Summary (Last 24 hours) at 05/11/2018 0853 Last data filed at 05/11/2018 0602 Gross per 24 hour  Intake 1522.7 ml  Output 1000 ml  Net 522.7 ml    Exam  Awake Alert, Oriented x 3, No new F.N deficits, Normal affect .AT,PERRAL Supple Neck,No JVD, No cervical lymphadenopathy appriciated.  Symmetrical Chest wall movement, Good air movement bilaterally, CTAB RRR,No Gallops,Rubs or new Murmurs, No Parasternal Heave +ve B.Sounds, Abd Soft, Peritoneal drain in place, Non tender, No organomegaly appriciated, No rebound -guarding or rigidity. No Cyanosis, Clubbing or edema, No new Rash or bruise   Data Review   CBC w Diff:  Lab Results  Component Value Date   WBC 5.2 05/10/2018   HGB 7.9 (L) 05/10/2018   HGB 8.8 (L) 11/14/2017   HGB 10.4 (L) 07/10/2017   HCT 24.2 (L) 05/10/2018   HCT 32.5 (L) 07/10/2017   PLT 105 (L) 05/10/2018   PLT 85 (L) 11/14/2017   PLT 75 (L) 07/10/2017   LYMPHOPCT 13.0 02/19/2018   LYMPHOPCT 15.2 07/10/2017   MONOPCT 7.9 05/08/2018   MONOPCT 7.1 07/10/2017   EOSPCT 4.0 05/08/2018   EOSPCT 2.6 07/10/2017   BASOPCT 0.8 05/08/2018   BASOPCT 1.2 07/10/2017    CMP:  Lab Results  Component Value Date   NA 126 (L) 05/11/2018   NA 138 07/10/2017   K 4.0 05/11/2018   K 4.1 07/10/2017   CL 101 05/11/2018   CO2  17 (L) 05/11/2018   CO2 35 (H) 07/10/2017   BUN 54 (H) 05/11/2018   BUN 13.2 07/10/2017   CREATININE 1.94 (H) 05/11/2018   CREATININE 1.89 (H) 05/08/2018   CREATININE 1.0 07/10/2017   PROT 4.0 (L) 05/10/2018   PROT 5.9 (L) 07/10/2017   ALBUMIN 2.1 (L) 05/10/2018   ALBUMIN 3.1 (L) 07/10/2017   BILITOT 1.1 05/10/2018   BILITOT 0.99 07/10/2017   ALKPHOS 137 (H) 05/10/2018   ALKPHOS 117 07/10/2017   AST 31 05/10/2018   AST 32 07/10/2017   ALT 28 05/10/2018   ALT 18  07/10/2017  .   Total Time in preparing paper work, data evaluation and todays exam - 72 minutes  Lala Lund M.D on 05/11/2018 at 8:53 AM  [facility]   Office  [phone]

## 2018-05-11 NOTE — Progress Notes (Signed)
[facility] ([facility] GIP RN visit at [place]  This is a related and covered GIP admission on 05/09/2018 with [facility] diagnosis of Cancer per Dr. Karie Georges. Patient has an [place] DNR. Patient had a visit with her provider on 10/3 and had lab work completed. The next day the [facility] SN contacted the pt for a HV however caregiver informed the SN that he was at the [facility] ED. Primary caregiver informed the [facility] that he received a call that morning that the patient's Potassium level was elevated and he needed to take the patient to the ED for further evaluation. Pt was admitted to the hospital with potassium greater then 7, generalized weakness, 3 days of difficulty walking indicating "legs feeling weak", some chronic loose stools and on & off nausea but no vomiting. Also found to have intermittent bradycardia and irregular HR. Pt was given an albuterol 10 mg neb treatment, a gram of calcium gluconate, 50 mL's of D50, 5 units of insulin IV and a packet of [place] in the ED  Day 2 of [facility] GIP.  Main issues related to elevated potassium levels. Lab results today indicated level is down to 4. Pt remains mobile in the room and continue tolerate her meals with no reported issues.  Hospice liaison met with pt today at bedside with no other family members or visitors available.  Pt indicates no pain or discomfort and bedside nurse ([place]) confirms no PRN medication use as pt continue to do well with no reported issues.   Medications: In addition scheduled medications: NS @100ml /hr, no PRN medications given  Transfer summary and medication list remain in shallow chart and plan of care was discussed with [facility] RN and SW.  Communication with [facility]: No family present but pt alert and oriented during today's bedside visit.  Communication with [facility]: Team update on patient's disposition today.  Pt has indicated she will be discharged today (confirmed by bedside nurse). Pt states her spouse  will pick her up today around 12:00pm.   Discharge summary has been completed by Dr.  Candiss Norse indicating Home with Hospice.   If pt needs transportation services please contact [facility] [phone] as [facility] contracts with this services for Hospice patients.   Thank You.  Raina Mina, RN,BSN [facility] Liaison [phone]  Reading are on AMION

## 2018-05-11 NOTE — Discharge Instructions (Signed)
Disposition.  Home with hospice.  Goal of care is comfort. CODE STATUS.  DNR Follow-up.  Hospice nurse. Diet.  Heart healthy diet with 1.5 L/day total fluid restriction.  With feeding assistance and aspiration precautions. Activity.  With assistance and fall precautions.

## 2018-05-11 NOTE — Care Management Note (Signed)
Case Management Note  Patient Details  Name: [name] MRN: [id number] Date of Birth: 1946-02-27  Subjective/Objective:                    Action/Plan:  Spoke w patient at bedside. She states that her husband will bring portable O2 and provide transport home. Spoke w Lattie Haw from [facility] to notify of return to home today. She has already pulled notes from computer and was aware of DC.  No further CM needs.   Expected Discharge Date:  05/11/18               Expected Discharge Plan:  Home w Hospice Care  In-House Referral:     Discharge planning Services  CM Consult  Post Acute Care Choice:    Choice offered to:     DME Arranged:    DME Agency:     HH Arranged:    [place] Agency:     Status of Service:  Completed, signed off  If discussed at [employer], dates discussed:    Additional Comments:  Carles Collet, RN 05/11/2018, 9:40 AM

## 2018-05-11 NOTE — Progress Notes (Signed)
Nsg Discharge Note  Admit Date:  05/09/2018 Discharge date: 05/11/2018   [name] to be discharged home with her husband per MD order.  AVS completed and given to patient along with printed prescriptions. Patient/caregiver able to verbalize understanding.  Discharge Medication: Allergies as of 05/11/2018      Reactions   Atorvastatin Other (See Comments)   Unknown reaction   Diphenhydramine Hcl (sleep) Hives   Hydrocodone-acetaminophen Other (See Comments)   Unknown reaction   Lopid [gemfibrozil] Other (See Comments)   Unknown reaction   Loratadine Hives   Lorazepam Hives   Simvastatin Other (See Comments)   Unknown reaction   Sulfamethoxazole Hives   Sulfonamide Derivatives Hives      Medication List    STOP taking these medications   metolazone 5 MG tablet Commonly known as:  ZAROXOLYN   spironolactone 25 MG tablet Commonly known as:  ALDACTONE     TAKE these medications   albuterol 108 (90 Base) MCG/ACT inhaler Commonly known as:  PROVENTIL HFA;VENTOLIN HFA Inhale 2 puffs into the lungs every 6 (six) hours as needed for wheezing or shortness of breath.   allopurinol 300 MG tablet Commonly known as:  ZYLOPRIM take 1 tablet (300 mg) by mouth daily with supper - for gout What changed:    how much to take  how to take this  when to take this  additional instructions   ALPRAZolam 0.25 MG tablet Commonly known as:  XANAX Take 1 tab as needed for anxiety, cramping, air hunger every 8 hours as needed. What changed:  See the new instructions.   cholestyramine light 4 GM/DOSE powder Commonly known as:  PREVALITE Take 1 packet (4 g total) by mouth 2 (two) times daily with a meal. For itching. What changed:  additional instructions   ciprofloxacin 500 MG tablet Commonly known as:  CIPRO Take 500 mg by mouth every Monday, Wednesday, and Friday.   citalopram 20 MG tablet Commonly known as:  CELEXA Take 1 tablet (20 mg total) by mouth daily.    cyclobenzaprine 10 MG tablet Commonly known as:  FLEXERIL Take 1 tablet (10 mg total) by mouth 3 (three) times daily as needed for muscle spasms.   ferrous sulfate 325 (65 FE) MG tablet Take 1 tablet (325 mg total) by mouth daily with breakfast.   furosemide 40 MG tablet Commonly known as:  LASIX Take 1 tablet (40 mg total) by mouth 2 (two) times daily.   gabapentin 600 MG tablet Commonly known as:  NEURONTIN Take 1 tablet (600 mg total) by mouth at bedtime. What changed:  when to take this   Glycopyrrolate-Formoterol 9-4.8 MCG/ACT Aero Inhale 2 puffs into the lungs 2 (two) times daily.   hydrOXYzine 10 MG tablet Commonly known as:  ATARAX/VISTARIL TAKE 1 TABLET BY MOUTH EVERY 4 HOURS AS NEEDED FOR ITCHING What changed:  See the new instructions.   ipratropium 0.02 % nebulizer solution Commonly known as:  ATROVENT Take 3 mLs by nebulization every 4 (four) hours as needed for wheezing or shortness of breath.   lactulose 10 GM/15ML solution Commonly known as:  CHRONULAC take 81ms TWICE DAILY What changed:    how much to take  how to take this  when to take this  additional instructions   Magnesium 250 MG Tabs Take 250 mg by mouth at bedtime.   midodrine 5 MG tablet Commonly known as:  PROAMATINE Take 1 tablet 3 x /day with meals What changed:    how much  to take  how to take this  when to take this  additional instructions   ondansetron 4 MG tablet Commonly known as:  ZOFRAN Take 1 tablet (4 mg total) by mouth every 6 (six) hours as needed for nausea or vomiting.   OXYGEN Inhale 2-3 L into the lungs continuous. 3 L with exertion   pantoprazole 40 MG tablet Commonly known as:  PROTONIX Take 1 tablet (40 mg total) by mouth daily.   potassium chloride 10 MEQ tablet Commonly known as:  K-DUR Take 1 tablet (10 mEq total) by mouth daily. What changed:  when to take this   rifampin 300 MG capsule Commonly known as:  RIFADIN TAKE ONE CAPSULE BY  MOUTH TWICE DAILY FOR LIVER What changed:  See the new instructions.   traMADol 50 MG tablet Commonly known as:  ULTRAM TAKE 1 TABLET BY MOUTH EVERY 12 HOURS AS NEEDED FOR PAIN FOR CHRONIC PAIN What changed:    how much to take  how to take this  when to take this  reasons to take this  additional instructions   vitamin C 500 MG tablet Commonly known as:  ASCORBIC ACID Take 500 mg by mouth 3 (three) times daily.   Vitamin D3 5000 units Caps Take 5,000 Units by mouth daily with breakfast.       Discharge Assessment: Vitals:   05/11/18 0509 05/11/18 0519  BP: (!) 132/48 (!) 132/48  Pulse: 95 93  Resp: 20 20  Temp: 98.1 F (36.7 C) 98.1 F (36.7 C)  SpO2: 100% 100%   Skin clean, dry and intact without evidence of skin break down, no evidence of skin tears noted. IV catheter discontinued intact. Site without signs and symptoms of complications.  Dressing with slight pressure applied.  D/c Instructions-Education: Discharge instructions given to patient/family with verbalized understanding. Discharge education completed. Patient to [facility] care with [facility] and to follow up this week. Husband and hospice nurse are both educated on how to drain peritoneal catheter. Patient instructed to return to ED, call 911, or call MD for any changes in condition.  Patient escorted to main entrance with husband and discharged home via private auto.  Ginette Pitman, RN 05/11/2018 1:43 PM

## 2018-05-15 ENCOUNTER — Other Ambulatory Visit: Payer: Self-pay

## 2018-05-15 DIAGNOSIS — R682 Dry mouth, unspecified: Secondary | ICD-10-CM | POA: Diagnosis not present

## 2018-05-15 DIAGNOSIS — J040 Acute laryngitis: Secondary | ICD-10-CM | POA: Diagnosis not present

## 2018-05-15 DIAGNOSIS — D3610 Benign neoplasm of peripheral nerves and autonomic nervous system, unspecified: Secondary | ICD-10-CM | POA: Diagnosis not present

## 2018-05-15 NOTE — Patient Outreach (Signed)
   [facility]) Care Management  [place]  05/15/2018  [name] 21-Jan-1946 [id number]  72 year old female outreached by [facility] services for a 30 day post discharge medication review.  PMHx includes, but not limited to, esophageal varices, hypertension, atrial tachycardia, COPD Gold IV, GERD, hepatic encephalopathy and fibromyalgia.   Unsuccessful telephone call attempt #1 to patient.   Left HIPAA compliant voice message requesting a return call.  Plan:  Outreach attempt #2 in 3-4 business days.  [name], PharmD Clinical Pharmacist [place] [phone]

## 2018-05-16 ENCOUNTER — Ambulatory Visit: Payer: PPO | Admitting: Podiatry

## 2018-05-16 DIAGNOSIS — M7662 Achilles tendinitis, left leg: Secondary | ICD-10-CM | POA: Diagnosis not present

## 2018-05-16 DIAGNOSIS — M7661 Achilles tendinitis, right leg: Secondary | ICD-10-CM | POA: Diagnosis not present

## 2018-05-16 DIAGNOSIS — L600 Ingrowing nail: Secondary | ICD-10-CM

## 2018-05-16 NOTE — Patient Instructions (Signed)

## 2018-05-19 ENCOUNTER — Telehealth: Payer: Self-pay | Admitting: *Deleted

## 2018-05-19 NOTE — Telephone Encounter (Signed)
Per Dr Melford Aase, he advised the Hospice doctors can handle the pain medications for the patient.  Almyra Free is aware.

## 2018-05-20 ENCOUNTER — Ambulatory Visit: Payer: Self-pay

## 2018-05-20 ENCOUNTER — Other Ambulatory Visit: Payer: Self-pay

## 2018-05-20 NOTE — Patient Outreach (Signed)
[facility]) Care Management  05/20/2018  ALLANA SHRESTHA 09-26-1945 [id number]  72 year old female outreached by [place] services for a 30 day post discharge medication review.  PMHx includes, but not limited to, esophageal varices, hypertension, atrial tachycardia, COPD Gold IV, GERD, hepatic encephalopathy and fibromyalgia.   Successful outreach attempt to Mrs. Leanos.  Spoke with patient's husband, Liza Czerwinski.  Mr. Predmore declined [facility] services for a discharge medication review.  He states that Mrs.Merkley is under Hospice care.  [name], PharmD Clinical Pharmacist [place] [phone]

## 2018-05-27 DIAGNOSIS — J449 Chronic obstructive pulmonary disease, unspecified: Secondary | ICD-10-CM | POA: Diagnosis not present

## 2018-05-27 DIAGNOSIS — J9610 Chronic respiratory failure, unspecified whether with hypoxia or hypercapnia: Secondary | ICD-10-CM | POA: Diagnosis not present

## 2018-05-27 DIAGNOSIS — J441 Chronic obstructive pulmonary disease with (acute) exacerbation: Secondary | ICD-10-CM | POA: Diagnosis not present

## 2018-06-02 ENCOUNTER — Other Ambulatory Visit: Payer: Self-pay | Admitting: Internal Medicine

## 2018-06-05 NOTE — Progress Notes (Signed)
  Subjective:  Patient ID: [name], female    DOB: Apr 18, 1946,  MRN: [id number]  Chief Complaint  Patient presents with  . Ingrown Toenail    ingrown toenail f/u Right foot hallux   72 y.o. female returns for the above complaint.   Complains of pain to the back of both heels  Objective:   General AA&O x3. Normal mood and affect.  Vascular Foot warm and well perfused with good capillary refill.  Neurologic Sensation grossly intact.  Dermatologic Nail avulsion site healing well without drainage or erythema. Nail bed with overlying soft crust. Left intact. No signs of local infection.  Orthopedic: No tenderness to palpation of the toes. Pain palpation about the posterior aspect of the Achilles tendon bilat   Assessment & Plan:  Patient was evaluated and treated and all questions answered.  S/p Ingrown Toenail Excision -Healing well without issue. -Discussed return precautions. -F/u PRN  Achilles Tendonitis, bilaterally -XR reviewed as above. -Educated on stretching and icing of the affected limb.

## 2018-06-06 ENCOUNTER — Other Ambulatory Visit: Payer: Self-pay

## 2018-06-06 MED ORDER — GABAPENTIN 600 MG PO TABS: 600.0000 mg | ORAL_TABLET | Freq: Three times a day (TID) | ORAL | 2 refills | Status: AC

## 2018-06-09 ENCOUNTER — Ambulatory Visit (INDEPENDENT_AMBULATORY_CARE_PROVIDER_SITE_OTHER): Payer: PPO | Admitting: Physician Assistant

## 2018-06-09 ENCOUNTER — Encounter: Payer: Self-pay | Admitting: Physician Assistant

## 2018-06-09 VITALS — BP 120/68 | HR 85 | Temp 97.3°F | Resp 16 | Ht 62.0 in | Wt 164.0 lb

## 2018-06-09 DIAGNOSIS — M797 Fibromyalgia: Secondary | ICD-10-CM

## 2018-06-09 DIAGNOSIS — K7581 Nonalcoholic steatohepatitis (NASH): Secondary | ICD-10-CM

## 2018-06-09 DIAGNOSIS — E785 Hyperlipidemia, unspecified: Secondary | ICD-10-CM

## 2018-06-09 DIAGNOSIS — R601 Generalized edema: Secondary | ICD-10-CM

## 2018-06-09 DIAGNOSIS — M109 Gout, unspecified: Secondary | ICD-10-CM

## 2018-06-09 DIAGNOSIS — R188 Other ascites: Secondary | ICD-10-CM

## 2018-06-09 DIAGNOSIS — N179 Acute kidney failure, unspecified: Secondary | ICD-10-CM

## 2018-06-09 DIAGNOSIS — D696 Thrombocytopenia, unspecified: Secondary | ICD-10-CM

## 2018-06-09 DIAGNOSIS — N183 Chronic kidney disease, stage 3 unspecified: Secondary | ICD-10-CM

## 2018-06-09 DIAGNOSIS — Z0000 Encounter for general adult medical examination without abnormal findings: Secondary | ICD-10-CM | POA: Diagnosis not present

## 2018-06-09 DIAGNOSIS — K7290 Hepatic failure, unspecified without coma: Secondary | ICD-10-CM

## 2018-06-09 DIAGNOSIS — Z7189 Other specified counseling: Secondary | ICD-10-CM

## 2018-06-09 DIAGNOSIS — K7460 Unspecified cirrhosis of liver: Secondary | ICD-10-CM

## 2018-06-09 DIAGNOSIS — Z79899 Other long term (current) drug therapy: Secondary | ICD-10-CM

## 2018-06-09 DIAGNOSIS — I4719 Other supraventricular tachycardia: Secondary | ICD-10-CM

## 2018-06-09 DIAGNOSIS — J9612 Chronic respiratory failure with hypercapnia: Secondary | ICD-10-CM

## 2018-06-09 DIAGNOSIS — D500 Iron deficiency anemia secondary to blood loss (chronic): Secondary | ICD-10-CM

## 2018-06-09 DIAGNOSIS — K7682 Hepatic encephalopathy: Secondary | ICD-10-CM

## 2018-06-09 DIAGNOSIS — I10 Essential (primary) hypertension: Secondary | ICD-10-CM

## 2018-06-09 DIAGNOSIS — E662 Morbid (severe) obesity with alveolar hypoventilation: Secondary | ICD-10-CM

## 2018-06-09 DIAGNOSIS — I872 Venous insufficiency (chronic) (peripheral): Secondary | ICD-10-CM

## 2018-06-09 DIAGNOSIS — E7220 Disorder of urea cycle metabolism, unspecified: Secondary | ICD-10-CM

## 2018-06-09 DIAGNOSIS — J9611 Chronic respiratory failure with hypoxia: Secondary | ICD-10-CM

## 2018-06-09 DIAGNOSIS — I5032 Chronic diastolic (congestive) heart failure: Secondary | ICD-10-CM

## 2018-06-09 DIAGNOSIS — E559 Vitamin D deficiency, unspecified: Secondary | ICD-10-CM

## 2018-06-09 DIAGNOSIS — I8510 Secondary esophageal varices without bleeding: Secondary | ICD-10-CM

## 2018-06-09 DIAGNOSIS — I471 Supraventricular tachycardia: Secondary | ICD-10-CM

## 2018-06-09 DIAGNOSIS — C220 Liver cell carcinoma: Secondary | ICD-10-CM

## 2018-06-09 DIAGNOSIS — J449 Chronic obstructive pulmonary disease, unspecified: Secondary | ICD-10-CM

## 2018-06-09 MED ORDER — CITALOPRAM HYDROBROMIDE 10 MG PO TABS: 10.0000 mg | ORAL_TABLET | Freq: Every day | ORAL | 2 refills | Status: AC

## 2018-06-09 MED ORDER — PANTOPRAZOLE SODIUM 40 MG PO TBEC: 40.0000 mg | DELAYED_RELEASE_TABLET | Freq: Two times a day (BID) | ORAL | 0 refills | Status: AC

## 2018-06-09 NOTE — Progress Notes (Signed)
CPE AND FOLLOW UP  Assessment:   Advanced care planning and counseling LONG discussion about end of life, patient acknowledges that she has multi organ failure that is getting progressive, she also acknowledges that her quality of life is not good however she would like to still be medically treated to try to make it through Christmas with her family. She is agreed to re evaluate at that time to see if she would be more comfortable with hospice. I have openly discussed that I feel that hospice is a better option for her and that medically she has many conflicting and over lapping issues that it is difficult to treat her outpatient, they still wish to do palliative care. She does not want life support or CPR, she is DNR. She does wish to die at home and understands that wish may not be carried out with palliative care, she also wishes to try to spend christmas with her family so we will work to keep her electrolytes in order and as comfortable as possible until after that where we will readdress this issues. She will follow back up with GI.   Esophagitis Increase protonix to BID, may add carafate if needed May benefit from thicker liquids or thickening agent  Secondary esophageal varices without bleeding ([place]) continue follow up GI Continue meds  Atrial tachycardia, multifocal (HCC) Continue O2, continue to monitor  Chronic respiratory failure with hypoxia (HCC) Continue O2, continue to monitor  COPD mixed type (HCC) On 3L O2  Cirrhosis of liver without ascites, unspecified hepatic cirrhosis type ([place]) continue follow up GI Continue meds  Hepatic encephalopathy (HCC) Continue lactulose, will check ammonia  Thrombocytopenia (HCC) Monitor CBC  Hyperammonemia (HCC) Continue lactulose  Essential hypertension - continue medications, DASH diet, exercise and monitor at home. Call if greater than 130/80.  -     CBC with Differential/Platelet -     BASIC METABOLIC PANEL WITH GFR -      Hepatic function panel  Anemia, unspecified type Monitor closely- likely multifactorial  Hyperlipidemia, unspecified hyperlipidemia type - decrease fatty foods, increase activity.   Hypokalemia/hyperkalemia Due to medications, CKD, CHF, multiorgan failure  QT prolongation Monitor, avoid meds that prolong QT, check electrolytes. Cut celexa to 10 mg a day  Anasarca Continue meds, fluid restrict  Venous insufficiency Elevate legs, better with aggressive diuersis  Benign neoplasm of colon, unspecified part of colon Monitor  GERD Continue PPI/H2 blocker, diet discussed  Irritable bowel syndrome, unspecified type monitor  Diverticulosis of large intestine without hemorrhage Increase fiber  Fibromyalgia Continue mediations  Obesity  - fluid restriction and decreasing salt/sugar  Chronic low back pain with bilateral sciatica, unspecified back pain laterality Continue meds  Neuropathy Monitor  Chronic diastolic CHF (congestive heart failure) ([place]) Continue dieureiss, monitor weight  Obesity hypoventilation syndrome (HCC) Continue CPAP  Recurrent right pleural effusion Continue dieureiss, will monitor  Hepatocellular carcinoma ([place]) Needs follow up with GI  CKD (chronic kidney disease), stage III ([place]) Discussed with patient   Over 40 minutes of exam, counseling, chart review, and critical decision making was performed Future Appointments  Date Time Provider [place]  06/13/2018  1:45 PM Evelina Bucy, DPM [facility]  07/15/2018  2:30 PM Vicie Mutters, PA-C [facility] None  11/14/2018  3:00 PM [facility] 2 [facility] None  11/14/2018  3:30 PM Truitt Merle, MD [facility] None  06/29/2019 10:00 AM Unk Pinto, MD [facility] None     Subjective:   [name] is a 72 y.o. chronically  ill female who presents for CPE and follow up for multitude of chronic medical co morbidities.   Patient has NASH, cirrhosis with  esophageal varices, and has had RFA for hepatocellular carcinoma in the past, follows with Dr. Havery Moros. She has end stage COPD/chronic hypoxia, on oxygen, she has CKD stage 3-4.   She has had several admissions for encephalopathy,CHF, hypoxia, etc and has been started on hospice HOWEVER her and her husband would like to be put on palliative care at this time until after Christmas.   She had an episode in oct for hyperkalemia while in hospice where she went to the ER for treatment. They were told by hospice that they would not treat it, patient is very tearful and states she wants more time with her sister, kids, grandkids and great grand kids. We discussed her quality of life, she is able to walk with a walker minimally at home, she has to have 1-1.5 liters of fluid removed for her ascites which her husband or a nurse does, she is very tired, has trouble swallowing, chronic pain. She understands that her quality of life is as good as we can get it at this time but requests that we treat her the best we can medically so she can try to spend christmas with her family. She understands that she is end stage and we discussed that many of her organs are failing at this time. LONG discussion about quality versus quantity of life.   She does want to be DNR, she does not want life support but she does want treatment for infection and her electrolyte abnormalities to the best of our abilities. She would prefer to die at home, though I have told her that with her doing palliative care and going to the hospital for treatment we can not guarantee this wish. That with this transition she acknowledges that she will have more blood draws, potential scans and medical tests that may be uncomfortable for her and she will have to have transport for.   Her weight has been steady with a peritoneal catheter, though she continues on lasix, and laculose. She does have dizziness with standing and seems very dry with her mucosa.  Wt  Readings from Last 4 Encounters:  06/09/18 164 lb (74.4 kg)  05/09/18 167 lb 5.3 oz (75.9 kg)  04/08/18 185 lb (83.9 kg)  03/28/18 192 lb (87.1 kg)   Her blood pressure has been controlled at home, today their BP is BP: 120/68 She does not workout. She denies chest pain, shortness of breath, dizziness.  She is not on cholesterol medication and denies myalgias. Her cholesterol is not at goal. The cholesterol last visit was:   Lab Results  Component Value Date   CHOL 177 05/31/2017   HDL 53 05/31/2017   LDLCALC 101 (H) 05/31/2017   TRIG 130 05/31/2017   CHOLHDL 3.3 05/31/2017   She has not been working on diet and exercise for diabetes with CKD however sugars have decreased dramatically likely with decrease in renal function and decreased appeitie. Last A1C in the office was:  Lab Results  Component Value Date   HGBA1C 5.1 09/03/2017   Last GFR Lab Results  Component Value Date   GFRNONAA 25 (L) 05/11/2018   Patient is on Vitamin D supplement. Lab Results  Component Value Date   VD25OH 65 09/03/2017     Patient is on allopurinol for gout and does not report a recent flare.  Lab Results  Component Value Date  LABURIC 5.8 09/03/2017   She is on celexa for depression, in partial remission, on 54m.   Medication Review Current Outpatient Medications on File Prior to Visit  Medication Sig  . albuterol (PROVENTIL HFA;VENTOLIN HFA) 108 (90 Base) MCG/ACT inhaler Inhale 2 puffs into the lungs every 6 (six) hours as needed for wheezing or shortness of breath.  . allopurinol (ZYLOPRIM) 300 MG tablet take 1 tablet (300 mg) by mouth daily with supper - for gout (Patient taking differently: Take 300 mg by mouth daily with supper. )  . ALPRAZolam (XANAX) 0.25 MG tablet Take 1 tab as needed for anxiety, cramping, air hunger every 8 hours as needed. (Patient taking differently: Take 0.25 mg by mouth every 8 (eight) hours as needed for anxiety. )  . Cholecalciferol (VITAMIN D3) 5000 units  CAPS Take 5,000 Units by mouth daily with breakfast.  . cholestyramine light (PREVALITE) 4 GM/DOSE powder Take 1 packet (4 g total) by mouth 2 (two) times daily with a meal. For itching. (Patient taking differently: Take 4 g by mouth 2 (two) times daily with a meal. )  . ciprofloxacin (CIPRO) 500 MG tablet Take 500 mg by mouth every Monday, Wednesday, and Friday.   . cyclobenzaprine (FLEXERIL) 10 MG tablet Take 1 tablet (10 mg total) by mouth 3 (three) times daily as needed for muscle spasms.  . ferrous sulfate 325 (65 FE) MG tablet Take 1 tablet (325 mg total) by mouth daily with breakfast.  . furosemide (LASIX) 40 MG tablet Take 1 tablet (40 mg total) by mouth 2 (two) times daily.  .Marland Kitchengabapentin (NEURONTIN) 600 MG tablet Take 1 tablet (600 mg total) by mouth 3 (three) times daily.  . Glycopyrrolate-Formoterol (BEVESPI AEROSPHERE) 9-4.8 MCG/ACT AERO Inhale 2 puffs into the lungs 2 (two) times daily.  . hydrOXYzine (ATARAX/VISTARIL) 10 MG tablet TAKE 1 TABLET BY MOUTH EVERY 4 HOURS AS NEEDED FOR ITCHING (Patient taking differently: Take 10 mg by mouth every 4 (four) hours as needed for itching. )  . ipratropium (ATROVENT) 0.02 % nebulizer solution Take 3 mLs by nebulization every 4 (four) hours as needed for wheezing or shortness of breath.  . lactulose (CHRONULAC) 10 GM/15ML solution take 339m TWICE DAILY (Patient taking differently: Take 20 g by mouth daily. )  . Magnesium 250 MG TABS Take 250 mg by mouth at bedtime.   . midodrine (PROAMATINE) 5 MG tablet Take 1 tablet 3 x /day with meals (Patient taking differently: Take 5 mg by mouth 3 (three) times daily with meals. )  . ondansetron (ZOFRAN) 4 MG tablet Take 1 tablet (4 mg total) by mouth every 6 (six) hours as needed for nausea or vomiting.  . OXYGEN Inhale 2-3 L into the lungs continuous. 3 L with exertion  . potassium chloride (K-DUR) 10 MEQ tablet Take 1 tablet (10 mEq total) by mouth daily.  . rifampin (RIFADIN) 300 MG capsule TAKE ONE  CAPSULE BY MOUTH TWICE DAILY FOR LIVER (Patient taking differently: Take 300 mg by mouth 2 (two) times daily. )  . SARNA SENSITIVE 1 % LOTN APPLY TO AFFECTED AREAS FOUR TIMES A DAY AS NEEDED FOR ITCHING  . traMADol (ULTRAM) 50 MG tablet TAKE 1 TABLET BY MOUTH EVERY 12 HOURS AS NEEDED FOR PAIN FOR CHRONIC PAIN (Patient taking differently: Take 50 mg by mouth every 12 (twelve) hours as needed for severe pain. )  . vitamin C (ASCORBIC ACID) 500 MG tablet Take 500 mg by mouth 3 (three) times daily.   No current  facility-administered medications on file prior to visit.     Current Problems (verified) Patient Active Problem List   Diagnosis Date Noted  . Hyperkalemia 05/09/2018  . Advanced care planning/counseling discussion   . Palliative care by specialist   . Shortness of breath 03/11/2018  . Abdominal pain 03/11/2018  . Hypervolemia   . Pressure injury of skin 02/27/2018  . Iron deficiency anemia due to chronic blood loss   . Occult blood in stools   . Acute blood loss anemia 01/19/2018  . Chronic respiratory failure with hypoxia and hypercapnia ([place]) 12/26/2017  . Obesity hypoventilation syndrome ([place])   . Recurrent right pleural effusion   . NASH (nonalcoholic steatohepatitis)   . Liver cirrhosis secondary to nonalcoholic steatohepatitis (NASH) ([place])   . Chronic diastolic CHF (congestive heart failure) ([place])   . Macrocytic anemia   . CKD (chronic kidney disease), stage III ([place])   . Ascites 09/07/2017  . Hepatocellular carcinoma ([place]) 08/30/2017  . Anasarca 12/14/2016  . Obesity (BMI 30.0-34.9) 10/12/2016  . Hyperammonemia ([place]) 10/12/2016  . Atrial tachycardia, multifocal ([place])   . Acute renal failure superimposed on stage 3 chronic kidney disease ([place])   . Hyponatremia   . Chronic respiratory failure ([place]) 01/19/2016  . Hepatic encephalopathy ([place]) 09/21/2015  . Hypokalemia 09/21/2015  . COPD GOLD IV/ 02 dep  08/30/2015  . Thrombocytopenia ([place]) 08/07/2015  . Venous  insufficiency 07/26/2015  . Goals of care, counseling/discussion 12/14/2013  . Essential hypertension   . Hyperlipidemia   . GERD   . Vitamin D deficiency   . IBS   . Fibromyalgia   . Esophageal varices ([place]) 07/08/2012  . COLONIC POLYPS 11/11/2008  . Symptomatic anemia 05/28/2008  . Esophagitis 05/27/2008  . Diverticulosis of large intestine 05/27/2008    Screening Tests Immunization History  Administered Date(s) Administered  . Hep A / Hep B 11/05/2013, 11/12/2013, 12/07/2013, 12/31/2014  . Hepatitis B, adult 12/19/2015  . Hepatitis B, ped/adol 11/17/2015, 05/21/2016  . Influenza Split 05/26/2013, 06/17/2014  . Influenza, High Dose Seasonal PF 06/15/2015, 05/01/2017  . Influenza,inj,quad, With Preservative 05/22/2016  . Pneumococcal Conjugate-13 07/22/2015  . Pneumococcal Polysaccharide-23 08/28/2013  . Tdap 11/19/2012    Preventative care: Last colonoscopy: 2015 Last mammogram: 2012 per epic chart review, is followed by Dr. Lindi Adie for breast cancer history, she is unable to stand for [facility]- will not get another DEXA:2016  Prior vaccinations: TD or Tdap: 2014  Influenza: 2016  Pneumococcal: 2015 Prevnar13: 2016 Shingles/Zostavax: Declines, not indicated due to other chronic conditions  Names of Other Physician/Practitioners you currently use: 1. [facility]- here for primary care 2. Dr. Patrici Ranks, eye doctor, last visit several years ago 75. Dr. Johnella Moloney, dentist, last visit march/2018 Patient Care Team: Unk Pinto, MD as PCP - General (Internal Medicine) Inda Castle, MD (Inactive) as Consulting Physician (Gastroenterology) Izora Gala, MD as Consulting Physician (Otolaryngology) Armbruster, Carlota Raspberry, MD as Consulting Physician (Gastroenterology) Arne Cleveland, MD as Consulting Physician (Interventional Radiology) Truitt Merle, MD as Consulting Physician (Hematology)  Allergies Allergies  Allergen Reactions  .  Atorvastatin Other (See Comments)    Unknown reaction  . Diphenhydramine Hcl (Sleep) Hives  . Hydrocodone-Acetaminophen Other (See Comments)    Unknown reaction  . Lopid [Gemfibrozil] Other (See Comments)    Unknown reaction  . Loratadine Hives  . Lorazepam Hives  . Simvastatin Other (See Comments)    Unknown reaction  . Sulfamethoxazole Hives  . Sulfonamide Derivatives Hives    SURGICAL HISTORY  She  has a past surgical history that includes Hip Arthroplasty (Bilateral); Shoulder surgery (Right); Back surgery; Cholecystectomy; Neck surgery; Esophagogastroduodenoscopy (07/16/2012); gastric varices banding (07/16/2012); Abdominal hysterectomy; ir generic historical (06/25/2016); ir generic historical (06/27/2016); ir generic historical (07/17/2016); Cataract extraction, bilateral; IR Radiologist Eval & Mgmt (07/10/2017); Radiofrequency ablation (N/A, 08/30/2017); IR THORACENTESIS ASP PLEURAL SPACE W/IMG GUIDE (09/12/2017); IR THORACENTESIS ASP PLEURAL SPACE W/IMG GUIDE (09/17/2017); IR Radiologist Eval & Mgmt (10/01/2017); IR Radiologist Eval & Mgmt (12/11/2017); IR Paracentesis (01/20/2018); Esophagogastroduodenoscopy (N/A, 01/20/2018); IR Paracentesis (02/27/2018); IR Paracentesis (03/11/2018); IR [place] (03/14/2018); and IR Radiologist Eval & Mgmt (04/21/2018). FAMILY HISTORY Her family history includes Atrial fibrillation in her sister; Cancer in her father, maternal uncle, and other; Diabetes in her brother; Heart disease in her mother and sister. SOCIAL HISTORY She  reports that she quit smoking about 3 years ago. Her smoking use included cigarettes. She has a 50.00 pack-year smoking history. She has never used smokeless tobacco. She reports that she does not drink alcohol or use drugs.    Objective:   Today's Vitals   06/09/18 1005  BP: 120/68  Pulse: 85  Resp: 16  Temp: (!) 97.3 F (36.3 C)  SpO2: 99%  Weight: 164 lb (74.4 kg)  Height: 5' 2"  (1.575 m)   Body mass index  is 30 kg/m.  General Appearance: Appears ill, no acute distress Eyes: PERRLA, EOMs, conjunctiva no swelling or erythema Sinuses: No Frontal/maxillary tenderness ENT/Mouth: Ext aud canals clear, TMs without erythema.Tonsils not swollen or erythematous, dry mucosa Neck: Supple, thyroid normal.  Respiratory: 3L O2 via [place], diffuse decreased breath sounds, wheezing bilateral lower lobes, BS equal bilaterally without rales, rhonchi or stridor.  Cardio: RRR with 2/6 systolic murmur. Decreased bilateral pulses without edema bilateral legs with compression socks on Abdomen: Soft, + BS, Distended, firm, LUQ/epigastric tenderness, no guarding, rebound, hernias, masses. Musculoskeletal: Full ROM, 3/5 strength, in wheelchair, Decreased ROM left shoulder due to pain.  Skin: Warm, dry without rashes, lesions, ecchymosis.  Neuro: Cranial nerves intact. No Asterixis today Psych: Awake and oriented X 3, normal affect, Insight and Judgment appropriate.    Vicie Mutters, PA-C   06/09/2018

## 2018-06-09 NOTE — Patient Instructions (Signed)
Increase protonix to BID May add carafate if this does not help  Use thickener in your liquids  You have esophageal varises or large vessels in your thorat and stomach that can start bleeding, this can kill you and will cause black stool or low blood pressure  Follow back up with Dr. Havery Moros  We have made the referral for pallative care, after christmas we will discuss hospice again

## 2018-06-10 ENCOUNTER — Telehealth: Payer: Self-pay | Admitting: Physician Assistant

## 2018-06-10 ENCOUNTER — Encounter: Payer: Self-pay | Admitting: Physician Assistant

## 2018-06-10 LAB — COMPLETE METABOLIC PANEL WITH GFR
AG RATIO: 1.8 (calc) (ref 1.0–2.5)
ALT: 29 U/L (ref 6–29)
AST: 16 U/L (ref 10–35)
Albumin: 2.8 g/dL — ABNORMAL LOW (ref 3.6–5.1)
Alkaline phosphatase (APISO): 201 U/L — ABNORMAL HIGH (ref 33–130)
BUN/Creatinine Ratio: 22 (calc) (ref 6–22)
BUN: 59 mg/dL — AB (ref 7–25)
CALCIUM: 8.2 mg/dL — AB (ref 8.6–10.4)
CO2: 11 mmol/L — AB (ref 20–32)
CREATININE: 2.65 mg/dL — AB (ref 0.60–0.93)
Chloride: 98 mmol/L (ref 98–110)
GFR, EST NON AFRICAN AMERICAN: 17 mL/min/{1.73_m2} — AB (ref >=60)
GFR, Est African American: 20 mL/min/{1.73_m2} — ABNORMAL LOW (ref >=60)
GLUCOSE: 109 mg/dL — AB (ref 65–99)
Globulin: 1.6 g/dL (calc) — ABNORMAL LOW (ref 1.9–3.7)
Potassium: 6.2 mmol/L (ref 3.5–5.3)
Sodium: 119 mmol/L — CL (ref 135–146)
TOTAL PROTEIN: 4.4 g/dL — AB (ref 6.1–8.1)
Total Bilirubin: 0.9 mg/dL (ref 0.2–1.2)

## 2018-06-10 LAB — CBC WITH DIFFERENTIAL/PLATELET
Basophils Absolute: 48 cells/uL (ref 0–200)
Basophils Relative: 0.5 %
Eosinophils Absolute: 475 cells/uL (ref 15–500)
Eosinophils Relative: 5 %
HCT: 26 % — ABNORMAL LOW (ref 35.0–45.0)
Hemoglobin: 8.8 g/dL — ABNORMAL LOW (ref 11.7–15.5)
Lymphs Abs: 988 cells/uL (ref 850–3900)
MCH: 32.4 pg (ref 27.0–33.0)
MCHC: 33.8 g/dL (ref 32.0–36.0)
MCV: 95.6 fL (ref 80.0–100.0)
MONOS PCT: 6.6 %
MPV: 12.3 fL (ref 7.5–12.5)
Neutro Abs: 7363 cells/uL (ref 1500–7800)
Neutrophils Relative %: 77.5 %
PLATELETS: 133 10*3/uL — AB (ref 140–400)
RBC: 2.72 10*6/uL — AB (ref 3.80–5.10)
RDW: 14.8 % (ref 11.0–15.0)
Total Lymphocyte: 10.4 %
WBC mixed population: 627 cells/uL (ref 200–950)
WBC: 9.5 10*3/uL (ref 3.8–10.8)

## 2018-06-10 LAB — IRON, TOTAL/TOTAL IRON BINDING CAP
%SAT: 51 % — AB (ref 16–45)
Iron: 130 ug/dL (ref 45–160)
TIBC: 255 mcg/dL (calc) (ref 250–450)

## 2018-06-10 LAB — URIC ACID: URIC ACID, SERUM: 3 mg/dL (ref 2.5–7.0)

## 2018-06-10 LAB — PROTIME-INR
INR: 1
Prothrombin Time: 10.5 s (ref 9.0–11.5)

## 2018-06-10 LAB — AMMONIA: Ammonia: 177 umol/L — ABNORMAL HIGH (ref <=72)

## 2018-06-10 LAB — MAGNESIUM: Magnesium: 2.5 mg/dL (ref 1.5–2.5)

## 2018-06-10 NOTE — Telephone Encounter (Signed)
[name] had a discussion this morning with patient's spouse [name] to review lab results. Notes recorded by Kerry Mutters, PA-C on 06/10/2018 at 9:10 AM EST Called husband and discussed with him Told him with worsening kidney failure, very low sodium and high potassium we would suggest the patient go to the ER We emphasized again that she has multiorgan failure and that at this time we advise the ER or hospice. We discussed that with her liver and kidney failure, end stage COPD and heart failure that her body will not be able to regulate, her enzymes and labs will repeatedly be abnormal and she will likely need repeated ER and hospital admissions which will be hard on his wife and her body at this time.  I did offer attempting to adjust things out patient but discussed that this is not ideal and that this could result in her death which they understand.  Husband will discuss with his wife and either take her to the ER or stay with hospice or call back.   [name] called back and states [name] wants to stay home and continue with Hospice. Provider recommends patient discontinue Potassium and Celexa. We called  Almyra Free, [facility] nurse and reviewed labs and patient/family conversation outcome.  Patient status: very weak last night and today, "out of it", per spouse. Almyra Free will go to patient's home today to check on patient.

## 2018-06-13 ENCOUNTER — Ambulatory Visit: Payer: PPO | Admitting: Podiatry

## 2018-07-06 DEATH — deceased

## 2018-07-15 ENCOUNTER — Ambulatory Visit: Payer: Self-pay | Admitting: Physician Assistant

## 2018-11-14 ENCOUNTER — Other Ambulatory Visit: Payer: PPO

## 2018-11-14 ENCOUNTER — Ambulatory Visit: Payer: PPO | Admitting: Hematology

## 2018-12-13 IMAGING — CR DG CHEST 2V
2 series · 2 of 2 positions shown · non-contrast
Comparison: 10/12/2016

CLINICAL DATA: Former smoker, recent pneumonia, history COPD,
cirrhosis, hypertension

EXAM:
CHEST  2 VIEW

[chest lat]
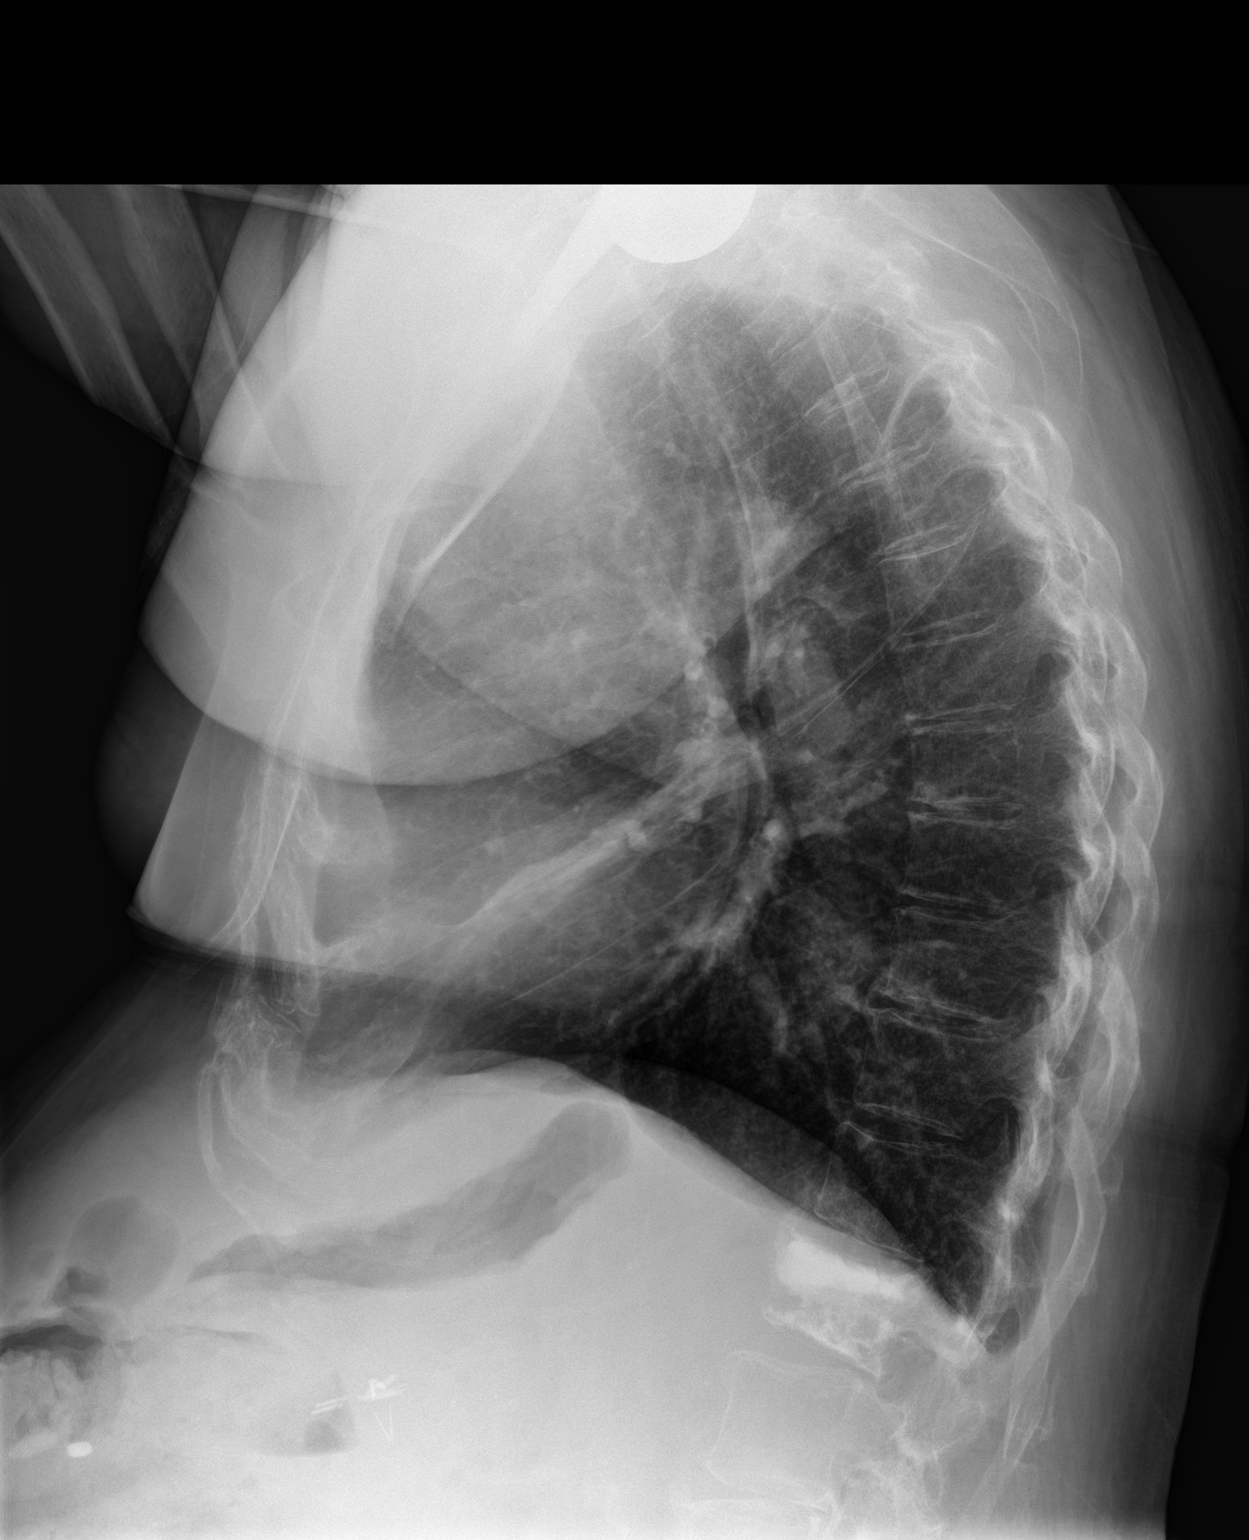

[chest ap]
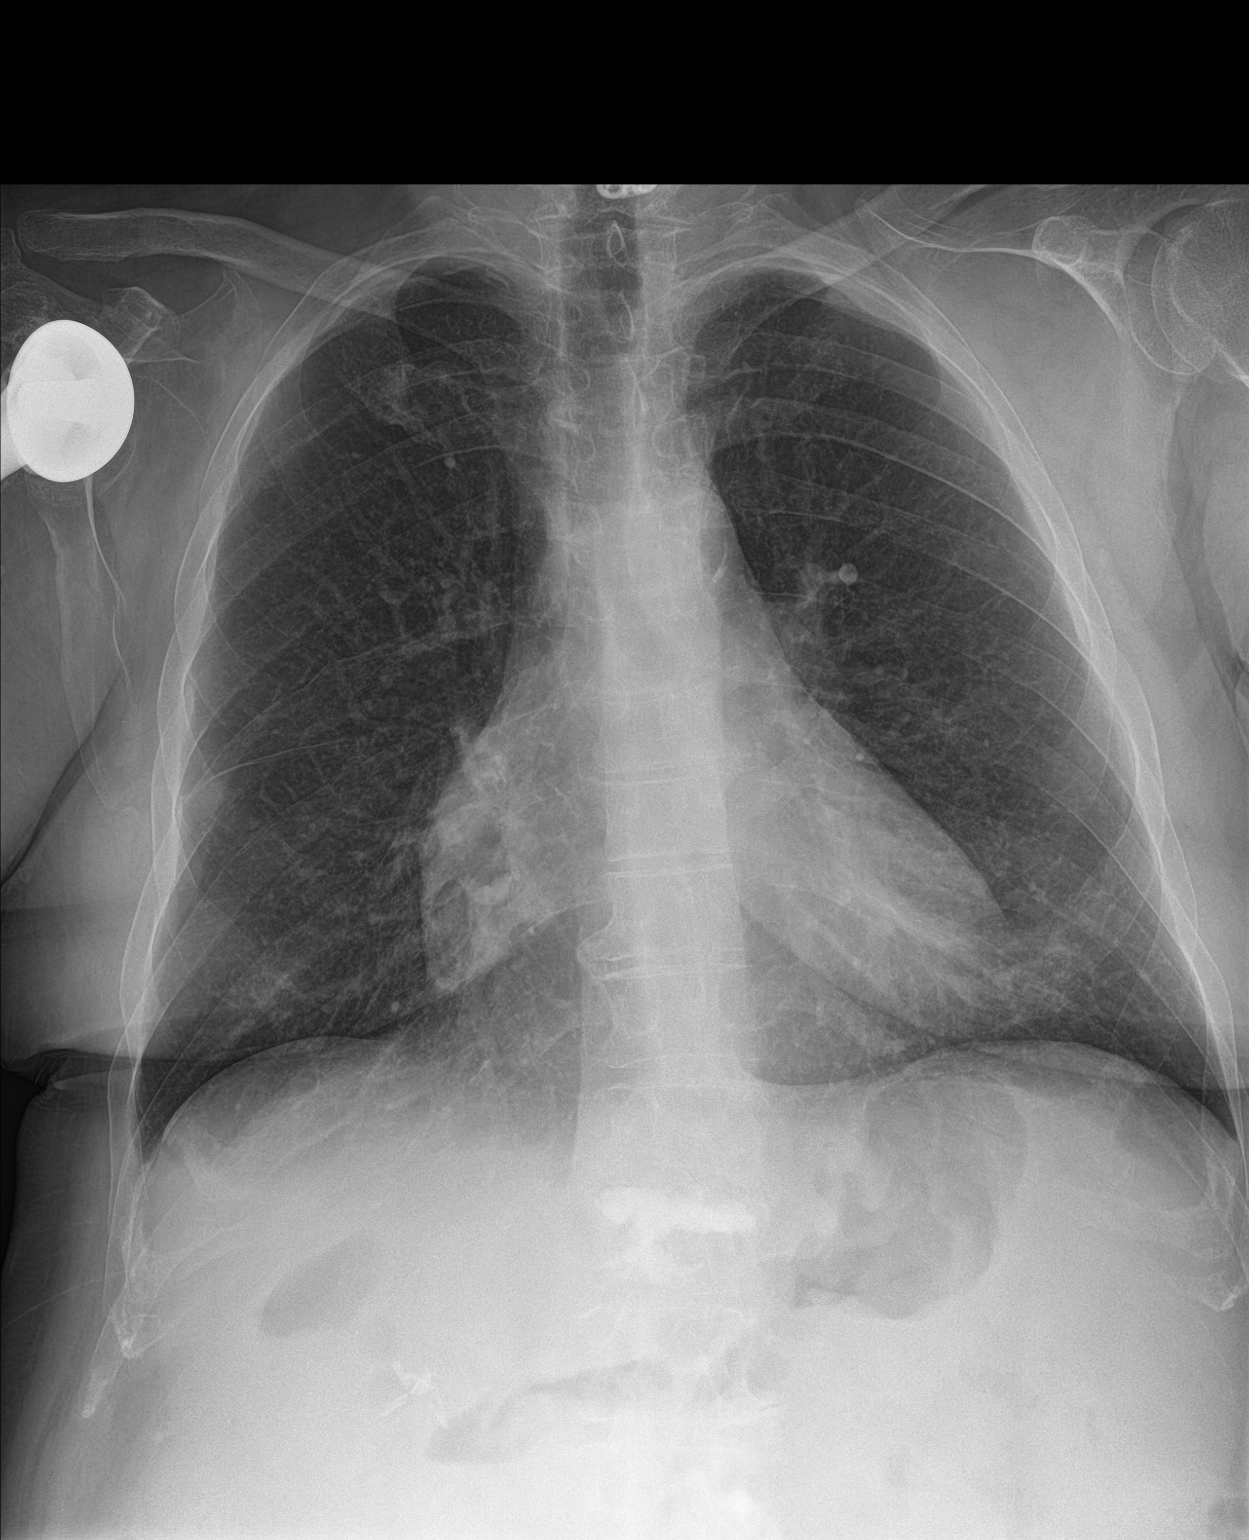

[2 of 2 positions shown; findings below may reference images not displayed]

FINDINGS: Minimal enlargement of cardiac silhouette.

Atherosclerotic calcification aorta.

Mediastinal contours and pulmonary vascularity normal.

Persistent subsegmental RIGHT middle lobe on lateral view.

Lungs otherwise clear.

No pleural effusion or pneumothorax.

Bones demineralized with evidence of prior cervical spine fusion,
RIGHT shoulder arthroplasty, and L1 vertebral augmentation.
IMPRESSION: Persistent subsegmental atelectasis RIGHT middle lobe.

## 2019-06-29 ENCOUNTER — Encounter: Payer: Self-pay | Admitting: Internal Medicine

## 2020-01-15 DIAGNOSIS — Z1211 Encounter for screening for malignant neoplasm of colon: Secondary | ICD-10-CM

## 2020-01-15 DIAGNOSIS — Z1212 Encounter for screening for malignant neoplasm of rectum: Secondary | ICD-10-CM
